# Patient Record
Sex: Female | Born: 1955 | Race: White | Hispanic: No | Smoking: Former smoker
Health system: Southern US, Community
[De-identification: ages and names within clinical notes are randomized; demographics above are authoritative.]

## PROBLEM LIST (undated history)

## (undated) DIAGNOSIS — M810 Age-related osteoporosis without current pathological fracture: Secondary | ICD-10-CM

## (undated) DIAGNOSIS — R011 Cardiac murmur, unspecified: Secondary | ICD-10-CM

## (undated) DIAGNOSIS — H269 Unspecified cataract: Secondary | ICD-10-CM

## (undated) DIAGNOSIS — G43909 Migraine, unspecified, not intractable, without status migrainosus: Secondary | ICD-10-CM

## (undated) DIAGNOSIS — R0600 Dyspnea, unspecified: Secondary | ICD-10-CM

## (undated) DIAGNOSIS — M7581 Other shoulder lesions, right shoulder: Secondary | ICD-10-CM

## (undated) DIAGNOSIS — I4891 Unspecified atrial fibrillation: Secondary | ICD-10-CM

## (undated) DIAGNOSIS — E039 Hypothyroidism, unspecified: Secondary | ICD-10-CM

## (undated) DIAGNOSIS — I255 Ischemic cardiomyopathy: Secondary | ICD-10-CM

## (undated) DIAGNOSIS — R51 Headache: Secondary | ICD-10-CM

## (undated) DIAGNOSIS — D649 Anemia, unspecified: Secondary | ICD-10-CM

## (undated) DIAGNOSIS — M419 Scoliosis, unspecified: Secondary | ICD-10-CM

## (undated) DIAGNOSIS — M1990 Unspecified osteoarthritis, unspecified site: Secondary | ICD-10-CM

## (undated) DIAGNOSIS — I499 Cardiac arrhythmia, unspecified: Secondary | ICD-10-CM

## (undated) DIAGNOSIS — E785 Hyperlipidemia, unspecified: Secondary | ICD-10-CM

## (undated) DIAGNOSIS — Q242 Cor triatriatum: Secondary | ICD-10-CM

## (undated) DIAGNOSIS — E538 Deficiency of other specified B group vitamins: Secondary | ICD-10-CM

## (undated) DIAGNOSIS — I272 Pulmonary hypertension, unspecified: Secondary | ICD-10-CM

## (undated) DIAGNOSIS — K219 Gastro-esophageal reflux disease without esophagitis: Secondary | ICD-10-CM

## (undated) DIAGNOSIS — E119 Type 2 diabetes mellitus without complications: Secondary | ICD-10-CM

## (undated) DIAGNOSIS — Z7901 Long term (current) use of anticoagulants: Secondary | ICD-10-CM

## (undated) DIAGNOSIS — M48061 Spinal stenosis, lumbar region without neurogenic claudication: Secondary | ICD-10-CM

## (undated) DIAGNOSIS — R519 Headache, unspecified: Secondary | ICD-10-CM

## (undated) DIAGNOSIS — I5020 Unspecified systolic (congestive) heart failure: Secondary | ICD-10-CM

## (undated) DIAGNOSIS — I10 Essential (primary) hypertension: Secondary | ICD-10-CM

## (undated) DIAGNOSIS — I639 Cerebral infarction, unspecified: Secondary | ICD-10-CM

## (undated) DIAGNOSIS — K589 Irritable bowel syndrome without diarrhea: Secondary | ICD-10-CM

## (undated) DIAGNOSIS — I509 Heart failure, unspecified: Secondary | ICD-10-CM

## (undated) DIAGNOSIS — I2510 Atherosclerotic heart disease of native coronary artery without angina pectoris: Secondary | ICD-10-CM

## (undated) DIAGNOSIS — I429 Cardiomyopathy, unspecified: Secondary | ICD-10-CM

## (undated) DIAGNOSIS — Z8619 Personal history of other infectious and parasitic diseases: Secondary | ICD-10-CM

## (undated) DIAGNOSIS — G4730 Sleep apnea, unspecified: Secondary | ICD-10-CM

## (undated) DIAGNOSIS — R0609 Other forms of dyspnea: Secondary | ICD-10-CM

## (undated) DIAGNOSIS — G459 Transient cerebral ischemic attack, unspecified: Secondary | ICD-10-CM

## (undated) HISTORY — DX: Other shoulder lesions, right shoulder: M75.81

## (undated) HISTORY — PX: CORONARY ARTERY BYPASS GRAFT: SHX141

## (undated) HISTORY — PX: TUBAL LIGATION: SHX77

## (undated) HISTORY — PX: LEFT ATRIAL APPENDAGE OCCLUSION: SHX173A

## (undated) HISTORY — PX: CATARACT EXTRACTION W/ INTRAOCULAR LENS IMPLANT: SHX1309

## (undated) HISTORY — PX: EYE SURGERY: SHX253

## (undated) HISTORY — PX: CARDIAC CATHETERIZATION: SHX172

## (undated) SURGICAL SUPPLY — 4 items
APPLIER CLIP 11 MED OPEN (CLIP) IMPLANT
APPLIER CLIP 13 LRG OPEN (CLIP) IMPLANT
ELECT REM PT RETURN 9FT ADLT (ELECTROSURGICAL) ×2 IMPLANT
RELOAD LINEAR CUT PROX 55 BLUE (ENDOMECHANICALS) ×4 IMPLANT

---

## 2002-11-28 DIAGNOSIS — Z8619 Personal history of other infectious and parasitic diseases: Secondary | ICD-10-CM

## 2002-11-28 HISTORY — DX: Personal history of other infectious and parasitic diseases: Z86.19

## 2004-01-28 DIAGNOSIS — Z951 Presence of aortocoronary bypass graft: Secondary | ICD-10-CM

## 2004-01-28 HISTORY — PX: COR TRIATRIATUM REPAIR: SHX1396

## 2004-01-28 HISTORY — PX: CORONARY ARTERY BYPASS GRAFT: SHX141

## 2004-01-28 HISTORY — DX: Presence of aortocoronary bypass graft: Z95.1

## 2005-03-01 ENCOUNTER — Ambulatory Visit: Payer: Self-pay | Admitting: Ophthalmology

## 2005-05-11 ENCOUNTER — Ambulatory Visit: Payer: Self-pay | Admitting: General Practice

## 2006-05-23 ENCOUNTER — Ambulatory Visit: Payer: Self-pay | Admitting: General Practice

## 2006-06-12 ENCOUNTER — Ambulatory Visit: Payer: Self-pay | Admitting: Gastroenterology

## 2007-05-30 ENCOUNTER — Ambulatory Visit: Payer: Self-pay | Admitting: Family Medicine

## 2009-04-08 ENCOUNTER — Ambulatory Visit: Payer: Self-pay

## 2010-11-15 ENCOUNTER — Ambulatory Visit: Payer: Self-pay | Admitting: Family Medicine

## 2010-11-24 ENCOUNTER — Ambulatory Visit: Payer: Self-pay | Admitting: Family Medicine

## 2010-12-08 ENCOUNTER — Ambulatory Visit: Payer: Self-pay | Admitting: Family Medicine

## 2011-12-26 ENCOUNTER — Ambulatory Visit: Payer: Self-pay | Admitting: Family Medicine

## 2013-02-13 ENCOUNTER — Ambulatory Visit: Payer: Self-pay | Admitting: Family Medicine

## 2014-02-27 ENCOUNTER — Ambulatory Visit: Payer: Self-pay | Admitting: Family Medicine

## 2014-10-08 DIAGNOSIS — I4891 Unspecified atrial fibrillation: Secondary | ICD-10-CM | POA: Insufficient documentation

## 2014-10-08 DIAGNOSIS — E785 Hyperlipidemia, unspecified: Secondary | ICD-10-CM | POA: Insufficient documentation

## 2014-10-08 DIAGNOSIS — I10 Essential (primary) hypertension: Secondary | ICD-10-CM | POA: Insufficient documentation

## 2014-10-08 DIAGNOSIS — E119 Type 2 diabetes mellitus without complications: Secondary | ICD-10-CM | POA: Insufficient documentation

## 2014-10-08 DIAGNOSIS — I482 Chronic atrial fibrillation, unspecified: Secondary | ICD-10-CM | POA: Insufficient documentation

## 2015-04-22 ENCOUNTER — Ambulatory Visit
Admission: RE | Admit: 2015-04-22 | Discharge: 2015-04-22 | Disposition: A | Discharge status: Home / Self Care | Payer: BLUE CROSS/BLUE SHIELD | Source: Ambulatory Visit | Attending: Family Medicine | Admitting: Family Medicine

## 2015-04-22 ENCOUNTER — Other Ambulatory Visit: Payer: Self-pay | Admitting: Family Medicine

## 2015-04-22 DIAGNOSIS — Z1231 Encounter for screening mammogram for malignant neoplasm of breast: Secondary | ICD-10-CM

## 2016-03-16 ENCOUNTER — Other Ambulatory Visit: Payer: Self-pay | Admitting: Family Medicine

## 2016-03-16 DIAGNOSIS — Z1231 Encounter for screening mammogram for malignant neoplasm of breast: Secondary | ICD-10-CM

## 2016-04-22 ENCOUNTER — Ambulatory Visit: Payer: BLUE CROSS/BLUE SHIELD

## 2016-04-27 ENCOUNTER — Ambulatory Visit
Admission: RE | Admit: 2016-04-27 | Discharge: 2016-04-27 | Disposition: A | Discharge status: Home / Self Care | Payer: BLUE CROSS/BLUE SHIELD | Source: Ambulatory Visit | Attending: Family Medicine | Admitting: Family Medicine

## 2016-04-27 DIAGNOSIS — Z1231 Encounter for screening mammogram for malignant neoplasm of breast: Secondary | ICD-10-CM | POA: Diagnosis not present

## 2016-05-16 ENCOUNTER — Other Ambulatory Visit: Payer: Self-pay | Admitting: Neurology

## 2016-05-16 DIAGNOSIS — R200 Anesthesia of skin: Secondary | ICD-10-CM

## 2016-05-29 DIAGNOSIS — G459 Transient cerebral ischemic attack, unspecified: Secondary | ICD-10-CM

## 2016-05-29 HISTORY — DX: Transient cerebral ischemic attack, unspecified: G45.9

## 2016-06-03 ENCOUNTER — Other Ambulatory Visit: Payer: BLUE CROSS/BLUE SHIELD

## 2016-06-08 ENCOUNTER — Ambulatory Visit
Admission: RE | Admit: 2016-06-08 | Discharge: 2016-06-08 | Disposition: A | Discharge status: Home / Self Care | Payer: BLUE CROSS/BLUE SHIELD | Source: Ambulatory Visit | Attending: Neurology | Admitting: Neurology

## 2016-06-08 DIAGNOSIS — R200 Anesthesia of skin: Secondary | ICD-10-CM | POA: Insufficient documentation

## 2016-06-08 DIAGNOSIS — R202 Paresthesia of skin: Secondary | ICD-10-CM | POA: Insufficient documentation

## 2016-06-08 DIAGNOSIS — R9082 White matter disease, unspecified: Secondary | ICD-10-CM | POA: Insufficient documentation

## 2017-03-03 ENCOUNTER — Other Ambulatory Visit: Payer: Self-pay | Admitting: Family Medicine

## 2017-03-03 DIAGNOSIS — K439 Ventral hernia without obstruction or gangrene: Secondary | ICD-10-CM

## 2017-03-03 DIAGNOSIS — Z1231 Encounter for screening mammogram for malignant neoplasm of breast: Secondary | ICD-10-CM

## 2017-03-07 ENCOUNTER — Ambulatory Visit: Payer: BLUE CROSS/BLUE SHIELD

## 2017-03-21 ENCOUNTER — Other Ambulatory Visit: Payer: Self-pay | Admitting: Cardiology

## 2017-03-21 DIAGNOSIS — I25110 Atherosclerotic heart disease of native coronary artery with unstable angina pectoris: Secondary | ICD-10-CM | POA: Insufficient documentation

## 2017-03-21 DIAGNOSIS — I255 Ischemic cardiomyopathy: Secondary | ICD-10-CM | POA: Insufficient documentation

## 2017-03-23 ENCOUNTER — Ambulatory Visit
Admission: RE | Admit: 2017-03-23 | Discharge: 2017-03-23 | Disposition: A | Discharge status: Home / Self Care | Payer: BLUE CROSS/BLUE SHIELD | Source: Ambulatory Visit | Attending: Family Medicine | Admitting: Family Medicine

## 2017-03-23 DIAGNOSIS — K439 Ventral hernia without obstruction or gangrene: Secondary | ICD-10-CM | POA: Diagnosis present

## 2017-03-29 ENCOUNTER — Encounter
Admission: RE | Disposition: A | Discharge status: Home / Self Care | Payer: Self-pay | Source: Ambulatory Visit | Attending: Cardiology

## 2017-03-29 ENCOUNTER — Ambulatory Visit
Admission: RE | Admit: 2017-03-29 | Discharge: 2017-03-29 | Disposition: A | Discharge status: Home / Self Care | Payer: BLUE CROSS/BLUE SHIELD | Source: Ambulatory Visit | Attending: Cardiology | Admitting: Cardiology

## 2017-03-29 DIAGNOSIS — E7800 Pure hypercholesterolemia, unspecified: Secondary | ICD-10-CM | POA: Diagnosis not present

## 2017-03-29 DIAGNOSIS — Z7901 Long term (current) use of anticoagulants: Secondary | ICD-10-CM | POA: Diagnosis not present

## 2017-03-29 DIAGNOSIS — I2510 Atherosclerotic heart disease of native coronary artery without angina pectoris: Secondary | ICD-10-CM | POA: Diagnosis present

## 2017-03-29 DIAGNOSIS — I272 Pulmonary hypertension, unspecified: Secondary | ICD-10-CM | POA: Diagnosis not present

## 2017-03-29 DIAGNOSIS — G43909 Migraine, unspecified, not intractable, without status migrainosus: Secondary | ICD-10-CM | POA: Diagnosis not present

## 2017-03-29 DIAGNOSIS — I110 Hypertensive heart disease with heart failure: Secondary | ICD-10-CM | POA: Insufficient documentation

## 2017-03-29 DIAGNOSIS — I2582 Chronic total occlusion of coronary artery: Secondary | ICD-10-CM | POA: Diagnosis not present

## 2017-03-29 DIAGNOSIS — Z951 Presence of aortocoronary bypass graft: Secondary | ICD-10-CM | POA: Diagnosis not present

## 2017-03-29 DIAGNOSIS — Z7984 Long term (current) use of oral hypoglycemic drugs: Secondary | ICD-10-CM | POA: Insufficient documentation

## 2017-03-29 DIAGNOSIS — I255 Ischemic cardiomyopathy: Secondary | ICD-10-CM | POA: Insufficient documentation

## 2017-03-29 DIAGNOSIS — M810 Age-related osteoporosis without current pathological fracture: Secondary | ICD-10-CM | POA: Insufficient documentation

## 2017-03-29 DIAGNOSIS — E118 Type 2 diabetes mellitus with unspecified complications: Secondary | ICD-10-CM | POA: Insufficient documentation

## 2017-03-29 DIAGNOSIS — Z8249 Family history of ischemic heart disease and other diseases of the circulatory system: Secondary | ICD-10-CM | POA: Diagnosis not present

## 2017-03-29 DIAGNOSIS — Z87891 Personal history of nicotine dependence: Secondary | ICD-10-CM | POA: Insufficient documentation

## 2017-03-29 DIAGNOSIS — K589 Irritable bowel syndrome without diarrhea: Secondary | ICD-10-CM | POA: Insufficient documentation

## 2017-03-29 DIAGNOSIS — E039 Hypothyroidism, unspecified: Secondary | ICD-10-CM | POA: Diagnosis not present

## 2017-03-29 DIAGNOSIS — I482 Chronic atrial fibrillation: Secondary | ICD-10-CM | POA: Diagnosis not present

## 2017-03-29 DIAGNOSIS — I509 Heart failure, unspecified: Secondary | ICD-10-CM | POA: Diagnosis not present

## 2017-03-29 DIAGNOSIS — Z7982 Long term (current) use of aspirin: Secondary | ICD-10-CM | POA: Diagnosis not present

## 2017-03-29 DIAGNOSIS — Z8774 Personal history of (corrected) congenital malformations of heart and circulatory system: Secondary | ICD-10-CM | POA: Insufficient documentation

## 2017-03-29 HISTORY — PX: RIGHT/LEFT HEART CATH AND CORONARY ANGIOGRAPHY: CATH118266

## 2017-03-29 HISTORY — DX: Dyspnea, unspecified: R06.00

## 2017-03-29 HISTORY — DX: Gastro-esophageal reflux disease without esophagitis: K21.9

## 2017-03-29 HISTORY — DX: Essential (primary) hypertension: I10

## 2017-03-29 HISTORY — DX: Atherosclerotic heart disease of native coronary artery without angina pectoris: I25.10

## 2017-03-29 HISTORY — DX: Type 2 diabetes mellitus without complications: E11.9

## 2017-03-29 LAB — PROTIME-INR
INR: 1.21
PROTHROMBIN TIME: 15.4 s — AB (ref 11.4–15.2)

## 2017-03-29 LAB — GLUCOSE, CAPILLARY: GLUCOSE-CAPILLARY: 133 mg/dL — AB (ref 65–99)

## 2017-03-29 SURGERY — RIGHT/LEFT HEART CATH AND CORONARY ANGIOGRAPHY
Anesthesia: Moderate Sedation | Laterality: Bilateral

## 2017-03-29 SURGERY — RIGHT AND LEFT HEART CATH
Anesthesia: Moderate Sedation

## 2017-03-29 SURGERY — RIGHT/LEFT HEART CATH AND CORONARY ANGIOGRAPHY
Anesthesia: Moderate Sedation

## 2017-03-29 MED ORDER — SODIUM CHLORIDE 0.9 % WEIGHT BASED INFUSION
1.0000 mL/kg/h | INTRAVENOUS | Status: DC
  Administered 2017-03-29: 1 mL/kg/h via INTRAVENOUS

## 2017-03-29 MED ORDER — METFORMIN HCL 500 MG PO TABS: 1000.0000 mg | ORAL_TABLET | Freq: Two times a day (BID) | ORAL | Status: DC

## 2017-03-29 MED ORDER — SODIUM CHLORIDE 0.9 % IV SOLN: 250.0000 mL | INTRAVENOUS | Status: DC | PRN

## 2017-03-29 MED ORDER — SODIUM CHLORIDE 0.9% FLUSH: 3.0000 mL | INTRAVENOUS | Status: DC | PRN

## 2017-03-29 MED ORDER — SODIUM CHLORIDE 0.9 % WEIGHT BASED INFUSION: 1.0000 mL/kg/h | INTRAVENOUS | Status: DC

## 2017-03-29 MED ORDER — FENTANYL CITRATE (PF) 100 MCG/2ML IJ SOLN
INTRAMUSCULAR | Status: AC
  Filled 2017-03-29: qty 2

## 2017-03-29 MED ORDER — LIDOCAINE HCL (PF) 1 % IJ SOLN
INTRAMUSCULAR | Status: DC | PRN
  Administered 2017-03-29: 19 mL via SUBCUTANEOUS

## 2017-03-29 MED ORDER — SODIUM CHLORIDE 0.9% FLUSH: 3.0000 mL | Freq: Two times a day (BID) | INTRAVENOUS | Status: DC

## 2017-03-29 MED ORDER — ASPIRIN 81 MG PO CHEW
81.0000 mg | CHEWABLE_TABLET | ORAL | Status: AC
  Administered 2017-03-29: 81 mg via ORAL

## 2017-03-29 MED ORDER — HEPARIN (PORCINE) IN NACL 2-0.9 UNIT/ML-% IJ SOLN
INTRAMUSCULAR | Status: AC
  Filled 2017-03-29: qty 500

## 2017-03-29 MED ORDER — IOPAMIDOL (ISOVUE-300) INJECTION 61%
INTRAVENOUS | Status: DC | PRN
  Administered 2017-03-29: 170 mL via INTRA_ARTERIAL

## 2017-03-29 MED ORDER — LIDOCAINE HCL (PF) 1 % IJ SOLN
INTRAMUSCULAR | Status: AC
  Filled 2017-03-29: qty 30

## 2017-03-29 MED ORDER — MIDAZOLAM HCL 2 MG/2ML IJ SOLN
INTRAMUSCULAR | Status: DC | PRN
  Administered 2017-03-29: 0.5 mg via INTRAVENOUS

## 2017-03-29 MED ORDER — ASPIRIN 81 MG PO CHEW
CHEWABLE_TABLET | ORAL | Status: AC
  Filled 2017-03-29: qty 1

## 2017-03-29 MED ORDER — FENTANYL CITRATE (PF) 100 MCG/2ML IJ SOLN
INTRAMUSCULAR | Status: DC | PRN
  Administered 2017-03-29: 25 ug via INTRAVENOUS

## 2017-03-29 MED ORDER — MIDAZOLAM HCL 2 MG/2ML IJ SOLN
INTRAMUSCULAR | Status: AC
  Filled 2017-03-29: qty 2

## 2017-03-29 MED ORDER — SODIUM CHLORIDE 0.9 % WEIGHT BASED INFUSION
3.0000 mL/kg/h | INTRAVENOUS | Status: DC
  Administered 2017-03-29: 3 mL/kg/h via INTRAVENOUS

## 2017-03-29 SURGICAL SUPPLY — 15 items
CATH INFINITI 5 FR IM (CATHETERS) ×3 IMPLANT
CATH INFINITI 5 FR LCB (CATHETERS) ×3 IMPLANT
CATH INFINITI 5FR ANG PIGTAIL (CATHETERS) ×3 IMPLANT
CATH INFINITI 5FR JL4 (CATHETERS) ×3 IMPLANT
CATH INFINITI JR4 5F (CATHETERS) ×3 IMPLANT
CATH SWANZ 7F THERMO (CATHETERS) ×3 IMPLANT
DEVICE CLOSURE MYNXGRIP 5F (Vascular Products) ×3 IMPLANT
GUIDEWIRE 3MM J TIP .035 145 (WIRE) ×2 IMPLANT
GUIDEWIRE J TIP .035 (WIRE) ×3 IMPLANT
KIT MANI 3VAL PERCEP (MISCELLANEOUS) ×3 IMPLANT
KIT RIGHT HEART (MISCELLANEOUS) ×3 IMPLANT
NEEDLE PERC 18GX7CM (NEEDLE) ×3 IMPLANT
PACK CARDIAC CATH (CUSTOM PROCEDURE TRAY) ×3 IMPLANT
SHEATH AVANTI 5FR X 11CM (SHEATH) ×3 IMPLANT
SHEATH PINNACLE 7F 10CM (SHEATH) ×3 IMPLANT

## 2017-03-29 NOTE — H&P (Signed)
Chief Complaint: Chief Complaint  Patient presents with  . Shortness of Breath  Yes I still have some ==Friday nite I had a bad spell walking from one field to the other  . other  I had an echo and myoview  Date of Service: 03/21/2017 Date of Birth: 1956/01/30 PCP: Macie Burows, MD  History of Present Illness: Ms. Tammy Arias is a 61 y.o.female patient who presents for follow-up visit. Has a history of atrial fibrillation, hypertension, hyperlipidemia and coronary artery disease status post coronary bypass grafting 3 in 2005 with a repair of cor triatriatum an ASD repair with a ischemic cardiomyopathy with an ejection fraction of 25-30%. She is on aspirin for anti-platelet therapy. She is anticoagulated with warfarin for her atrial fibrillation and remains on lovastatin for hyperlipidemia. She has had good rate control with her atrial fibrillation.. Patient complains of increasing dyspnea on exertion. This is gradually increased. Functional study showed no reversible ischemia. Echo showed reduced LV function similar to previously with TR and pulmonary hypertension. Her symptoms have persisted. Will need to further evaluate with right left heart cath.  Past Medical and Surgical History  Past Medical History Past Medical History:  Diagnosis Date  . Anemia, unspecified  . Arthritis  . Atrial fibrillation , unspecified (CMS-HCC)  . Cardiomyopathy, secondary (CMS-HCC)  ef 25-30  . Cataract cortical, senile  . Congestive heart disease (CMS-HCC)  . Coronary artery disease  . Heart murmur, unspecified  . History of shingles  . Hyperlipidemia, unspecified  . Hypertension  . Hypothyroidism, unspecified  . IBS (irritable bowel syndrome)  . Migraines  . Osteoporosis, post-menopausal  . Type 2 diabetes mellitus (CMS-HCC)   Past Surgical History She has a past surgical history that includes Tubal ligation (1980s); Congenital heart surgery; Coronary artery bypass graft (2005); and  Cardiac surgery.   Medications and Allergies  Current Medications  Current Outpatient Prescriptions  Medication Sig Dispense Refill  . albuterol 90 mcg/actuation inhaler Inhale 2 inhalations into the lungs every 6 (six) hours as needed. 1 Inhaler 0  . ALPRAZolam (XANAX) 1 MG tablet Take 1 mg by mouth once daily as needed for Sleep.  Marland Kitchen aspirin 81 MG EC tablet Take 81 mg by mouth once daily.  . carvedilol (COREG) 3.125 MG tablet Take 1 tablet (3.125 mg total) by mouth 2 (two) times daily with meals. 180 tablet 3  . clobetasol (TEMOVATE) 0.05 % ointment Apply topically once daily as needed (Apply a small amount to affected are qd prn).  . cyanocobalamin (VITAMIN B12) 1,000 mcg/mL injection Inject 1,000 mcg into the muscle monthly.  . cyanocobalamin (VITAMIN B12) 1000 MCG tablet Take 1,000 mcg by mouth once daily.  . cyclobenzaprine (FLEXERIL) 5 MG tablet Take 1 tablet (5 mg total) by mouth every 8 (eight) hours as needed for Muscle spasms. 20 tablet 0  . FUROsemide (LASIX) 40 MG tablet Take 1 tablet (40 mg total) by mouth once daily. 90 tablet 3  . inhalational spacer (AEROCHAMBER) spacer Use as instructed. 1 each 2  . LATANOPROST OPHTH Apply to eye.  . levothyroxine (SYNTHROID, LEVOTHROID) 75 MCG tablet Take 1 tablet (75 mcg total) by mouth once daily. Take on an empty stomach with a glass of water at least 30-60 minutes before breakfast. 90 tablet 3  . lisinopril (PRINIVIL,ZESTRIL) 5 MG tablet Take 1 tablet (5 mg total) by mouth once daily. 90 tablet 2  . lovastatin (MEVACOR) 10 MG tablet Take 1 tablet (10 mg total) by mouth daily with dinner.  90 tablet 3  . warfarin (COUMADIN) 3 MG tablet Take 1 tablet (3 mg total) by mouth as directed. 180 tablet 3  . metFORMIN (GLUCOPHAGE) 1000 MG tablet Take 1 tablet (1,000 mg total) by mouth 2 (two) times daily with meals. 180 tablet 3   No current facility-administered medications for this visit.   Allergies: Vioxx [rofecoxib]  Social and Family  History  Social History reports that she has quit smoking. She has never used smokeless tobacco. She reports that she does not drink alcohol or use drugs.  Family History Family History  Problem Relation Age of Onset  . Stroke Mother  . High blood pressure (Hypertension) Mother  . Myocardial Infarction (Heart attack) Father  . Stroke Father  . Diabetes type II Father  . Dementia Sister 47  . Lung cancer Sister 59  small cell   Review of Systems  Review of Systems  Constitutional: Negative for chills, diaphoresis, fever, malaise/fatigue and weight loss.  HENT: Negative for congestion, ear discharge, hearing loss and tinnitus.  Eyes: Negative for blurred vision.  Respiratory: Positive for shortness of breath. Negative for cough, hemoptysis, sputum production and wheezing.  Cardiovascular: Negative for chest pain, palpitations, orthopnea, claudication, leg swelling and PND.  Gastrointestinal: Negative for abdominal pain, blood in stool, constipation, diarrhea, heartburn, melena, nausea and vomiting.  Genitourinary: Negative for dysuria, frequency, hematuria and urgency.  Musculoskeletal: Negative for back pain, falls, joint pain and myalgias.  Skin: Negative for itching and rash.  Neurological: Negative for dizziness, tingling, focal weakness, loss of consciousness, weakness and headaches.  Endo/Heme/Allergies: Negative for polydipsia. Does not bruise/bleed easily.  Psychiatric/Behavioral: Negative for depression, memory loss and substance abuse. The patient is not nervous/anxious.    Physical Examination   Vitals:BP 122/72 (BP Location: Left upper arm, Patient Position: Sitting, BP Cuff Size: Adult)  Pulse 68  Resp 12  Ht 157.5 cm (5\' 2" )  Wt 69.9 kg (154 lb 3.2 oz)  BMI 28.20 kg/m  Ht:157.5 cm (5\' 2" ) Wt:69.9 kg (154 lb 3.2 oz) ACZ:YSAY surface area is 1.75 meters squared. Body mass index is 28.2 kg/m.  Wt Readings from Last 3 Encounters:  03/21/17 69.9 kg (154 lb 3.2 oz)   03/01/17 71.7 kg (158 lb)  02/15/17 71.2 kg (157 lb)   BP Readings from Last 3 Encounters:  03/21/17 122/72  03/01/17 118/72  02/15/17 132/70   General: Female in no acute distress LUNGS Breath Sounds: Normal Percussion: Normal  CARDIOVASCULAR JVP CV wave: no HJR: no Elevation at 90 degrees: None Carotid Pulse: normal pulsation bilaterally Bruit: None Apex: apical impulse normal  Auscultation Rhythm: atrial fibrillation S1: normal S2: normal Clicks: no Rub: no Murmurs: no murmurs  Gallop: None ABDOMEN Liver enlargement: no Pulsatile aorta: no Ascites: no Bruits: no  EXTREMITIES Clubbing: no Edema: trace to 1+ bilateral pedal edema Pulses: peripheral pulses symmetrical Femoral Bruits: no Amputation: no SKIN Rash: no Cyanosis: no Embolic phemonenon: no Bruising: no NEURO Alert and Oriented to person, place and time: yes Non focal: yes LABS Last 3 CBC results: Lab Results  Component Value Date  WBC 3.9 (L) 03/01/2017  WBC 4.3 03/04/2016  WBC 2.9 (L) 02/25/2015   Lab Results  Component Value Date  HGB 10.0 (L) 03/01/2017  HGB 12.1 03/04/2016  HGB 11.9 (L) 02/25/2015   Lab Results  Component Value Date  HCT 32.9 (L) 03/01/2017  HCT 37.5 03/04/2016  HCT 35.5 02/25/2015   Lab Results  Component Value Date  PLT 148 (L) 03/01/2017  PLT  129 (L) 03/04/2016  PLT 117 (L) 02/25/2015   Lab Results  Component Value Date  CREATININE 0.7 03/01/2017  BUN 14 03/01/2017  NA 140 03/01/2017  K 4.3 03/01/2017  CL 105 03/01/2017  CO2 26.9 03/01/2017   Lab Results  Component Value Date  HGBA1C 6.6 (H) 03/01/2017   Lab Results  Component Value Date  HDL 62.2 03/01/2017  HDL 83.9 03/04/2016  HDL 74.6 08/18/2014   Lab Results  Component Value Date  LDLCALC 64 03/01/2017  LDLCALC 71 03/04/2016  LDLCALC 72 08/18/2014   Lab Results  Component Value Date  TRIG 100 03/01/2017  TRIG 81 03/04/2016  TRIG 103 08/18/2014   Lab Results  Component  Value Date  ALT 20 03/01/2017  AST 30 03/01/2017  ALKPHOS 93 03/01/2017   Lab Results  Component Value Date  TSH 3.047 03/01/2017   Assessment and Plan   61 y.o. female with  ICD-10-CM ICD-9-CM  1. CAD of autologous bypass graft-no evidence of ischemia on EKG or clinically. Will continue with current regimen including aspirin, carvedilol and lovastatin. Low-fat low-cholesterol diet. Symptoms are persistent. We will proceed with a left heart cath to evaluate coronary anatomy to guide further therapy due to persistent unstable symptoms despite unremarkable functional study showing no reversible ischemia. I25.810 414.02 ECG 12-lead  2. Chronic atrial fibrillation-rate is controlled with carvedilol. She is anticoagulated with warfarin with an INR goal between 2 and 3. Will need to stop this 5 days prior to cardiac cath. I48.2 427.31  3. Pure hypercholesterolemia-hyperlipidemia is stable with lovastatin. LDL goal of less than 100. Current level is64.. E78.0 272.0  4. Essential hypertension-blood pressure is controlled with carvedilol and dash diet . Daily exercise, weight loss and compliance with of above-mentioned plan was recommended I10 401.9  5. SOB (shortness of breath)-echo shows EF 30-40%. Will need to evaluate progression of her coronary disease as well as evaluate her pulmonary pressures with right left heart cath. Further recognitions after this is complete. R06.02 786.05  6. Type 2 diabetes mellitus with complication-continue with metformin and ADA diet with an hemoglobin A1c goal of less than 6. Most recent value was 6.3. Compliance with ADA diet was recommended E11.8 250.90   Return in about 4 weeks (around 04/18/2017).  These notes generated with voice recognition software. I apologize for typographical errors.  Sydnee Levans, MD    Pt seen and examined. No change from above.

## 2017-03-29 NOTE — Progress Notes (Signed)
Patient resting ,stable clinically post heart cath. Sinus rhythm per monitor. Denies complaints at this time. Dr Ubaldo Glassing out to speak with patient and friend at length with questions answered. Right groin without bleeding nor hematoma.discharge teaching done with  Questions answered.

## 2017-03-29 NOTE — Discharge Instructions (Signed)
Groin Insertion Instructions-If you lose feeling or develop tingling or pain in your leg or foot after the procedure, please walk around first.  If the discomfort does not improve , contact your physician and proceed to the nearest emergency room.  Loss of feeling in your leg might mean that a blockage has formed in the artery and this can be appropriately treated.  Limit your activity for the next two days after your procedure.  Avoid stooping, bending, heavy lifting or exertion as this may put pressure on the insertion site.  Resume normal activities in 48 hours.  You may shower after 24 hours but avoid excessive warm water and do not scrub the site.  Remove clear dressing in 48 hours.  If you have had a closure device inserted, do not soak in a tub bath or a hot tub for at least one week. ° °No driving for 48 hours after discharge.  After the procedure, check the insertion site occasionally.  If any oozing occurs or there is apparent swelling, firm pressure over the site will prevent a bruise from forming.  You can not hurt anything by pressing directly on the site.  The pressure stops the bleeding by allowing a small clot to form.  If the bleeding continues after the pressure has been applied for more than 15 minutes, call 911 or go to the nearest emergency room.   ° °The x-ray dye causes you to pass a considerate amount of urine.  For this reason, you will be asked to drink plenty of liquids after the procedure to prevent dehydration.  You may resume you regular diet.  Avoid caffeine products.   ° °For pain at the site of your procedure, take non-aspirin medicines such as Tylenol. ° °Medications: A. Hold Metformin for 48 hours if applicable.  B. Continue taking all your present medications at home unless your doctor prescribes any changes. ° °Moderate Conscious Sedation, Adult, Care After °These instructions provide you with information about caring for yourself after your procedure. Your health care provider  may also give you more specific instructions. Your treatment has been planned according to current medical practices, but problems sometimes occur. Call your health care provider if you have any problems or questions after your procedure. °What can I expect after the procedure? °After your procedure, it is common: °· To feel sleepy for several hours. °· To feel clumsy and have poor balance for several hours. °· To have poor judgment for several hours. °· To vomit if you eat too soon. °Follow these instructions at home: °For at least 24 hours after the procedure:  ° °· Do not: °¨ Participate in activities where you could fall or become injured. °¨ Drive. °¨ Use heavy machinery. °¨ Drink alcohol. °¨ Take sleeping pills or medicines that cause drowsiness. °¨ Make important decisions or sign legal documents. °¨ Take care of children on your own. °· Rest. °Eating and drinking  °· Follow the diet recommended by your health care provider. °· If you vomit: °¨ Drink water, juice, or soup when you can drink without vomiting. °¨ Make sure you have little or no nausea before eating solid foods. °General instructions  °· Have a responsible adult stay with you until you are awake and alert. °· Take over-the-counter and prescription medicines only as told by your health care provider. °· If you smoke, do not smoke without supervision. °· Keep all follow-up visits as told by your health care provider. This is important. °Contact a health   care provider if: °· You keep feeling nauseous or you keep vomiting. °· You feel light-headed. °· You develop a rash. °· You have a fever. °Get help right away if: °· You have trouble breathing. °This information is not intended to replace advice given to you by your health care provider. Make sure you discuss any questions you have with your health care provider. °Document Released: 09/04/2013 Document Revised: 04/18/2016 Document Reviewed: 03/05/2016 °Elsevier Interactive Patient Education © 2017  [employer] ° °

## 2017-04-12 ENCOUNTER — Encounter
Admission: RE | Admit: 2017-04-12 | Discharge: 2017-04-12 | Disposition: A | Discharge status: Home / Self Care | Payer: BLUE CROSS/BLUE SHIELD | Source: Ambulatory Visit | Attending: Surgery | Admitting: Surgery

## 2017-04-12 DIAGNOSIS — Z01812 Encounter for preprocedural laboratory examination: Secondary | ICD-10-CM | POA: Diagnosis present

## 2017-04-12 HISTORY — DX: Unspecified atrial fibrillation: I48.91

## 2017-04-12 HISTORY — DX: Deficiency of other specified B group vitamins: E53.8

## 2017-04-12 HISTORY — DX: Anemia, unspecified: D64.9

## 2017-04-12 HISTORY — DX: Hypothyroidism, unspecified: E03.9

## 2017-04-12 HISTORY — DX: Cardiomyopathy, unspecified: I42.9

## 2017-04-12 HISTORY — DX: Unspecified osteoarthritis, unspecified site: M19.90

## 2017-04-12 HISTORY — DX: Heart failure, unspecified: I50.9

## 2017-04-12 HISTORY — DX: Cardiac murmur, unspecified: R01.1

## 2017-04-12 HISTORY — DX: Hyperlipidemia, unspecified: E78.5

## 2017-04-12 HISTORY — DX: Headache: R51

## 2017-04-12 HISTORY — DX: Cardiac arrhythmia, unspecified: I49.9

## 2017-04-12 HISTORY — DX: Irritable bowel syndrome, unspecified: K58.9

## 2017-04-12 HISTORY — DX: Headache, unspecified: R51.9

## 2017-04-12 HISTORY — DX: Transient cerebral ischemic attack, unspecified: G45.9

## 2017-04-12 HISTORY — DX: Personal history of other infectious and parasitic diseases: Z86.19

## 2017-04-12 LAB — CBC
HEMATOCRIT: 30.2 % — AB (ref 35.0–47.0)
Hemoglobin: 9.6 g/dL — ABNORMAL LOW (ref 12.0–16.0)
MCH: 26.1 pg (ref 26.0–34.0)
MCHC: 31.7 g/dL — ABNORMAL LOW (ref 32.0–36.0)
MCV: 82.3 fL (ref 80.0–100.0)
PLATELETS: 151 10*3/uL (ref 150–440)
RBC: 3.67 MIL/uL — AB (ref 3.80–5.20)
RDW: 16.7 % — ABNORMAL HIGH (ref 11.5–14.5)
WBC: 3.3 10*3/uL — AB (ref 3.6–11.0)

## 2017-04-12 LAB — DIFFERENTIAL
BASOS ABS: 0 10*3/uL (ref 0–0.1)
Basophils Relative: 1 %
Eosinophils Absolute: 0.2 10*3/uL (ref 0–0.7)
Eosinophils Relative: 6 %
LYMPHS PCT: 15 %
Lymphs Abs: 0.5 10*3/uL — ABNORMAL LOW (ref 1.0–3.6)
MONO ABS: 0.4 10*3/uL (ref 0.2–0.9)
MONOS PCT: 13 %
NEUTROS ABS: 2.2 10*3/uL (ref 1.4–6.5)
Neutrophils Relative %: 65 %

## 2017-04-12 NOTE — Patient Instructions (Signed)
Your procedure is scheduled [id number], 2018 (Friday) Report to Same Day Surgery 2nd floor medical mall [facility] Entrance-take elevator on left to 2nd floor.  Check in with surgery information desk.) To find out your arrival time please call [phone] between 1PM - 3PM on Apr 20, 2017 (Thursday)  Remember: Instructions that are not followed completely may result in serious medical risk, up to and including death, or upon the discretion of your surgeon and anesthesiologist your surgery may need to be rescheduled.    _x___ 1. Do not eat food or drink liquids after midnight. No gum chewing or hard candies                            .     __x__ 2. No Alcohol for 24 hours before or after surgery.   __x__3. No Smoking for 24 prior to surgery.   ____  4. Bring all medications with you on the day of surgery if instructed.    __x__ 5. Notify your doctor if there is any change in your medical condition     (cold, fever, infections).     Do not wear jewelry, make-up, hairpins, clips or nail polish.  Do not wear lotions, powders, or perfumes. You may wear deodorant.  Do not shave 48 hours prior to surgery. Men may shave face and neck.  Do not bring valuables to the hospital.    [facility] is not responsible for any belongings or valuables.               Contacts, dentures or bridgework may not be worn into surgery.  Leave your suitcase in the car. After surgery it may be brought to your room.  For patients admitted to the hospital, discharge time is determined by your treatment team                        Patients discharged the day of surgery will not be allowed to drive home.  You will need someone to drive you home and stay with you the night of your procedure.    Please read over the following fact sheets that you were given:   [facility] Preparing for Surgery and or MRSA Information   _x___ Take the following medications the morning of surgery with a sip of water:            1.  Levothyroxine            2. Lisinopril                 3. Carvedilol    ____Fleets enema or Magnesium Citrate as directed.   _x___ Use CHG Soap or sage wipes as directed on instruction sheet   _x___ Use inhalers on the day of surgery and bring to hospital day of surgery (USE PRO AIR [place])  _X___ Stop Metformin and Janumet 2 days prior to surgery.(STOP METFORMIN ON MAY 23)    ____ Take 1/2 of usual insulin dose the night before surgery and none on the morning     surgery.   _x___ Follow recommendations from Cardiologist, Pulmonologist or PCP regarding          stopping Aspirin, Coumadin, Pllavix ,Eliquis, Effient, or Pradaxa, and Pletal. (STOP  ASPIRIN ON MAY 17 AND STOP WARFARIN OM MAY  20)   X____Stop Anti-inflammatories such as Advil, Aleve,  Ibuprofen, Motrin, Naproxen, Naprosyn, Goodies powders or aspirin products. OK to take Tylenol   _x___ Stop supplements until after surgery.  But may continue Vitamin D, Vitamin B,  and multivitamin          ____ Bring C-Pap to the hospital.

## 2017-04-12 NOTE — Pre-Procedure Instructions (Signed)
Component Name Value Ref Range  Vent Rate (bpm) 79   QRS Interval (msec) 94   QT Interval (msec) 382   QTc (msec) 438   Result Narrative  Atrial fibrillation ST and T wave abnormality, consider inferior ischemia ST and T wave abnormality, consider anterolateral ischemia Abnormal ECG When compared with ECG of 13-Apr-2016 11:36, No significant change was found I reviewed and concur with this report. Electronically signed FR:EVQW MD, KEN ([id number]) on 02/22/2017 8:28:51 AM  Status Results Details

## 2017-04-14 NOTE — Pre-Procedure Instructions (Signed)
CBC sent to Dr. Tamala Julian and Anesthesia for review.

## 2017-04-21 ENCOUNTER — Ambulatory Visit
Admission: RE | Admit: 2017-04-21 | Discharge: 2017-04-21 | Disposition: A | Discharge status: Home / Self Care | Payer: BLUE CROSS/BLUE SHIELD | Source: Ambulatory Visit | Attending: Surgery | Admitting: Surgery

## 2017-04-21 ENCOUNTER — Ambulatory Visit: Payer: BLUE CROSS/BLUE SHIELD | Admitting: Certified Registered Nurse Anesthetist

## 2017-04-21 ENCOUNTER — Encounter: Payer: Self-pay | Admitting: *Deleted

## 2017-04-21 ENCOUNTER — Encounter
Admission: RE | Disposition: A | Discharge status: Home / Self Care | Payer: Self-pay | Source: Ambulatory Visit | Attending: Surgery

## 2017-04-21 DIAGNOSIS — I429 Cardiomyopathy, unspecified: Secondary | ICD-10-CM | POA: Insufficient documentation

## 2017-04-21 DIAGNOSIS — Z7984 Long term (current) use of oral hypoglycemic drugs: Secondary | ICD-10-CM | POA: Diagnosis not present

## 2017-04-21 DIAGNOSIS — I110 Hypertensive heart disease with heart failure: Secondary | ICD-10-CM | POA: Insufficient documentation

## 2017-04-21 DIAGNOSIS — K436 Other and unspecified ventral hernia with obstruction, without gangrene: Secondary | ICD-10-CM | POA: Diagnosis present

## 2017-04-21 DIAGNOSIS — Z8673 Personal history of transient ischemic attack (TIA), and cerebral infarction without residual deficits: Secondary | ICD-10-CM | POA: Insufficient documentation

## 2017-04-21 DIAGNOSIS — I4891 Unspecified atrial fibrillation: Secondary | ICD-10-CM | POA: Diagnosis not present

## 2017-04-21 DIAGNOSIS — Z7982 Long term (current) use of aspirin: Secondary | ICD-10-CM | POA: Diagnosis not present

## 2017-04-21 DIAGNOSIS — E538 Deficiency of other specified B group vitamins: Secondary | ICD-10-CM | POA: Insufficient documentation

## 2017-04-21 DIAGNOSIS — I272 Pulmonary hypertension, unspecified: Secondary | ICD-10-CM | POA: Diagnosis not present

## 2017-04-21 DIAGNOSIS — I509 Heart failure, unspecified: Secondary | ICD-10-CM | POA: Insufficient documentation

## 2017-04-21 DIAGNOSIS — E039 Hypothyroidism, unspecified: Secondary | ICD-10-CM | POA: Diagnosis not present

## 2017-04-21 DIAGNOSIS — Z7901 Long term (current) use of anticoagulants: Secondary | ICD-10-CM | POA: Insufficient documentation

## 2017-04-21 DIAGNOSIS — E119 Type 2 diabetes mellitus without complications: Secondary | ICD-10-CM | POA: Insufficient documentation

## 2017-04-21 DIAGNOSIS — Z79899 Other long term (current) drug therapy: Secondary | ICD-10-CM | POA: Diagnosis not present

## 2017-04-21 DIAGNOSIS — I2510 Atherosclerotic heart disease of native coronary artery without angina pectoris: Secondary | ICD-10-CM | POA: Insufficient documentation

## 2017-04-21 DIAGNOSIS — E785 Hyperlipidemia, unspecified: Secondary | ICD-10-CM | POA: Insufficient documentation

## 2017-04-21 DIAGNOSIS — Z87891 Personal history of nicotine dependence: Secondary | ICD-10-CM | POA: Insufficient documentation

## 2017-04-21 DIAGNOSIS — K430 Incisional hernia with obstruction, without gangrene: Secondary | ICD-10-CM | POA: Diagnosis not present

## 2017-04-21 HISTORY — PX: VENTRAL HERNIA REPAIR: SHX424

## 2017-04-21 LAB — PROTIME-INR
INR: 1.14
Prothrombin Time: 14.7 seconds (ref 11.4–15.2)

## 2017-04-21 LAB — GLUCOSE, CAPILLARY
Glucose-Capillary: 137 mg/dL — ABNORMAL HIGH (ref 65–99)
Glucose-Capillary: 140 mg/dL — ABNORMAL HIGH (ref 65–99)

## 2017-04-21 SURGERY — REPAIR, HERNIA, VENTRAL
Anesthesia: General | Site: Abdomen | Wound class: Clean

## 2017-04-21 MED ORDER — MIDAZOLAM HCL 2 MG/2ML IJ SOLN
INTRAMUSCULAR | Status: DC | PRN
  Administered 2017-04-21 (×2): 1 mg via INTRAVENOUS

## 2017-04-21 MED ORDER — EPHEDRINE SULFATE 50 MG/ML IJ SOLN
INTRAMUSCULAR | Status: AC
  Filled 2017-04-21: qty 1

## 2017-04-21 MED ORDER — SODIUM CHLORIDE 0.9 % IV SOLN
INTRAVENOUS | Status: DC
  Administered 2017-04-21: 09:00:00 via INTRAVENOUS

## 2017-04-21 MED ORDER — PROPOFOL 10 MG/ML IV BOLUS
INTRAVENOUS | Status: AC
  Filled 2017-04-21: qty 20

## 2017-04-21 MED ORDER — ONDANSETRON HCL 4 MG/2ML IJ SOLN
INTRAMUSCULAR | Status: AC
  Filled 2017-04-21: qty 2

## 2017-04-21 MED ORDER — FAMOTIDINE 20 MG PO TABS
ORAL_TABLET | ORAL | Status: AC
  Administered 2017-04-21: 20 mg via ORAL
  Filled 2017-04-21: qty 1

## 2017-04-21 MED ORDER — OXYCODONE HCL 5 MG/5ML PO SOLN: 5.0000 mg | Freq: Once | ORAL | Status: DC | PRN

## 2017-04-21 MED ORDER — FENTANYL CITRATE (PF) 100 MCG/2ML IJ SOLN
INTRAMUSCULAR | Status: AC
  Filled 2017-04-21: qty 2

## 2017-04-21 MED ORDER — SUGAMMADEX SODIUM 200 MG/2ML IV SOLN
INTRAVENOUS | Status: DC | PRN
  Administered 2017-04-21: 140 mg via INTRAVENOUS

## 2017-04-21 MED ORDER — HYDROCODONE-ACETAMINOPHEN 5-325 MG PO TABS: 1.0000 | ORAL_TABLET | ORAL | 0 refills | Status: DC | PRN

## 2017-04-21 MED ORDER — SUGAMMADEX SODIUM 200 MG/2ML IV SOLN
INTRAVENOUS | Status: AC
  Filled 2017-04-21: qty 2

## 2017-04-21 MED ORDER — BUPIVACAINE-EPINEPHRINE (PF) 0.5% -1:200000 IJ SOLN
INTRAMUSCULAR | Status: AC
  Filled 2017-04-21: qty 30

## 2017-04-21 MED ORDER — HYDROCODONE-ACETAMINOPHEN 5-325 MG PO TABS: 1.0000 | ORAL_TABLET | ORAL | Status: DC | PRN

## 2017-04-21 MED ORDER — MEPERIDINE HCL 50 MG/ML IJ SOLN: 6.2500 mg | INTRAMUSCULAR | Status: DC | PRN

## 2017-04-21 MED ORDER — FENTANYL CITRATE (PF) 100 MCG/2ML IJ SOLN
INTRAMUSCULAR | Status: AC
  Administered 2017-04-21: 25 ug via INTRAVENOUS
  Filled 2017-04-21: qty 2

## 2017-04-21 MED ORDER — DEXAMETHASONE SODIUM PHOSPHATE 10 MG/ML IJ SOLN
INTRAMUSCULAR | Status: AC
  Filled 2017-04-21: qty 1

## 2017-04-21 MED ORDER — ROCURONIUM BROMIDE 100 MG/10ML IV SOLN
INTRAVENOUS | Status: DC | PRN
  Administered 2017-04-21: 50 mg via INTRAVENOUS

## 2017-04-21 MED ORDER — CEFAZOLIN SODIUM-DEXTROSE 2-4 GM/100ML-% IV SOLN
2.0000 g | Freq: Once | INTRAVENOUS | Status: AC
  Administered 2017-04-21: 2 g via INTRAVENOUS

## 2017-04-21 MED ORDER — PROMETHAZINE HCL 25 MG/ML IJ SOLN: 6.2500 mg | INTRAMUSCULAR | Status: DC | PRN

## 2017-04-21 MED ORDER — LIDOCAINE HCL (CARDIAC) 20 MG/ML IV SOLN
INTRAVENOUS | Status: DC | PRN
  Administered 2017-04-21: 50 mg via INTRAVENOUS

## 2017-04-21 MED ORDER — FENTANYL CITRATE (PF) 100 MCG/2ML IJ SOLN
25.0000 ug | INTRAMUSCULAR | Status: DC | PRN
  Administered 2017-04-21 (×4): 25 ug via INTRAVENOUS

## 2017-04-21 MED ORDER — FENTANYL CITRATE (PF) 100 MCG/2ML IJ SOLN
INTRAMUSCULAR | Status: DC | PRN
  Administered 2017-04-21: 25 ug via INTRAVENOUS
  Administered 2017-04-21: 75 ug via INTRAVENOUS

## 2017-04-21 MED ORDER — PROPOFOL 10 MG/ML IV BOLUS
INTRAVENOUS | Status: DC | PRN
  Administered 2017-04-21: 80 mg via INTRAVENOUS

## 2017-04-21 MED ORDER — FAMOTIDINE 20 MG PO TABS
20.0000 mg | ORAL_TABLET | Freq: Once | ORAL | Status: AC
  Administered 2017-04-21: 20 mg via ORAL

## 2017-04-21 MED ORDER — ONDANSETRON HCL 4 MG/2ML IJ SOLN
INTRAMUSCULAR | Status: DC | PRN
  Administered 2017-04-21: 4 mg via INTRAVENOUS

## 2017-04-21 MED ORDER — OXYCODONE HCL 5 MG PO TABS: 5.0000 mg | ORAL_TABLET | Freq: Once | ORAL | Status: DC | PRN

## 2017-04-21 MED ORDER — MIDAZOLAM HCL 2 MG/2ML IJ SOLN
INTRAMUSCULAR | Status: AC
  Filled 2017-04-21: qty 2

## 2017-04-21 MED ORDER — EPHEDRINE SULFATE 50 MG/ML IJ SOLN
INTRAMUSCULAR | Status: DC | PRN
  Administered 2017-04-21 (×3): 10 mg via INTRAVENOUS

## 2017-04-21 MED ORDER — BUPIVACAINE-EPINEPHRINE 0.5% -1:200000 IJ SOLN
INTRAMUSCULAR | Status: DC | PRN
  Administered 2017-04-21: 15 mL

## 2017-04-21 MED ORDER — DEXAMETHASONE SODIUM PHOSPHATE 10 MG/ML IJ SOLN
INTRAMUSCULAR | Status: DC | PRN
  Administered 2017-04-21: 4 mg via INTRAVENOUS

## 2017-04-21 MED ORDER — CEFAZOLIN SODIUM-DEXTROSE 2-4 GM/100ML-% IV SOLN
INTRAVENOUS | Status: AC
  Filled 2017-04-21: qty 100

## 2017-04-21 MED ORDER — ROCURONIUM BROMIDE 50 MG/5ML IV SOLN
INTRAVENOUS | Status: AC
  Filled 2017-04-21: qty 1

## 2017-04-21 MED ORDER — LIDOCAINE HCL (PF) 2 % IJ SOLN
INTRAMUSCULAR | Status: AC
  Filled 2017-04-21: qty 2

## 2017-04-21 SURGICAL SUPPLY — 25 items
CANISTER SUCT 1200ML W/VALVE (MISCELLANEOUS) ×3 IMPLANT
CHLORAPREP W/TINT 26ML (MISCELLANEOUS) ×3 IMPLANT
DERMABOND ADVANCED (GAUZE/BANDAGES/DRESSINGS) ×2
DERMABOND ADVANCED .7 DNX12 (GAUZE/BANDAGES/DRESSINGS) ×1 IMPLANT
DRAPE LAPAROTOMY 100X77 ABD (DRAPES) ×3 IMPLANT
ELECT REM PT RETURN 9FT ADLT (ELECTROSURGICAL) ×3
ELECTRODE REM PT RTRN 9FT ADLT (ELECTROSURGICAL) ×1 IMPLANT
GAUZE SPONGE 4X4 12PLY STRL (GAUZE/BANDAGES/DRESSINGS) IMPLANT
GLOVE BIO SURGEON STRL SZ7.5 (GLOVE) ×3 IMPLANT
GOWN STRL REUS W/ TWL LRG LVL3 (GOWN DISPOSABLE) ×3 IMPLANT
GOWN STRL REUS W/TWL LRG LVL3 (GOWN DISPOSABLE) ×6
KIT RM TURNOVER STRD PROC AR (KITS) ×3 IMPLANT
LABEL OR SOLS (LABEL) ×3 IMPLANT
MESH SYNTHETIC 4X6 SOFT BARD (Mesh General) ×1 IMPLANT
MESH SYNTHETIC SOFT BARD 4X6 (Mesh General) ×2 IMPLANT
NEEDLE HYPO 25X1 1.5 SAFETY (NEEDLE) ×3 IMPLANT
NS IRRIG 500ML POUR BTL (IV SOLUTION) ×3 IMPLANT
PACK BASIN MINOR ARMC (MISCELLANEOUS) ×3 IMPLANT
STAPLER SKIN PROX 35W (STAPLE) IMPLANT
SUT CHROMIC 3 0 SH 27 (SUTURE) ×3 IMPLANT
SUT MNCRL 4-0 (SUTURE) ×2
SUT MNCRL 4-0 27XMFL (SUTURE) ×1
SUT SURGILON 0 30 BLK (SUTURE) ×9 IMPLANT
SUTURE MNCRL 4-0 27XMF (SUTURE) ×1 IMPLANT
SYRINGE 10CC LL (SYRINGE) ×3 IMPLANT

## 2017-04-21 NOTE — Op Note (Signed)
OPERATIVE REPORT  PREOPERATIVE  DIAGNOSIS: . Ventral hernia  POSTOPERATIVE DIAGNOSIS: . Ventral hernia  PROCEDURE: . Ventral hernia repair  ANESTHESIA:  General  SURGEON: Rochel Brome  MD   INDICATIONS: . She has had recent bulging in the epigastrium. A ventral hernia was demonstrated on physical exam in the epigastrium at the site where she wants had chest tubes after cardiac surgery. Surgery was recommended for definitive treatment.  With the patient on the operating table in the supine position under general anesthesia the epigastrium was prepared with ChloraPrep and draped in a sterile manner. A transversely oriented 5.5 centimeter incision was made and carried down through subcutaneous tissues. Several small bleeding points were cauterized. The superficial fascia was incised. Dissection was carried out to encounter herniated properitoneal fat which was approximately 5 cm in dimension and smooth. This was dissected down to the fascial ring defect. The fatty tissue was incarcerated. The fascial ring defect was enlarged on each side by about 6 mm. With manipulation the hernia could then be reduced. Bard soft mesh was cut to create an oval shape of 3 x 5 cm. This was placed into the properitoneal plane oriented transversely. This was sutured to the overlying fascia with through and through 0 Surgilon sutures with 4 point fixation. Next the repair was carried out with a transversely oriented suture line of interrupted 0 Surgilon figure-of-eight sutures incorporating each suture into the mesh. The deep fascia and subcuticular tissues were infiltrated with half percent Sensorcaine with epinephrine. Superficial fascia was closed with interrupted 3-0 chromic. The skin was closed with running 4-0 Monocryl subcuticular suture and Dermabond.  The patient tolerated surgery satisfactorily and was then prepared for transfer to the recovery room  [facility].D.

## 2017-04-21 NOTE — Anesthesia Preprocedure Evaluation (Signed)
Anesthesia Evaluation  Patient identified by MRN, date of birth, ID band Patient awake    Reviewed: Allergy & Precautions, NPO status , Patient's Chart, lab work & pertinent test results  History of Anesthesia Complications Negative for: history of anesthetic complications  Airway Mallampati: II  TM Distance: >3 FB Neck ROM: Full    Dental  (+) Upper Dentures, Lower Dentures   Pulmonary neg sleep apnea, neg COPD, former smoker,    breath sounds clear to auscultation- rhonchi (-) wheezing      Cardiovascular hypertension, + CAD, + CABG (3v 2005) and +CHF  (-) Cardiac Stents + dysrhythmias Atrial Fibrillation Valvular problems/murmurs: repair of cor triatriatum an ASD repair   Rhythm:Regular Rate:Normal - Systolic murmurs and - Diastolic murmurs Echo 08/06/82: Chronically occluded lad with akinesis of anteoapex. Patent svg to diagonal with flow to lad Insignificant disease in rca and lcx with no visible patent graft EF 30-35% with apical akinesis Moderate pulmonary hypertension   Neuro/Psych  Headaches, TIAnegative psych ROS   GI/Hepatic Neg liver ROS, GERD  ,  Endo/Other  diabetes, Oral Hypoglycemic AgentsHypothyroidism   Renal/GU negative Renal ROS     Musculoskeletal  (+) Arthritis ,   Abdominal (+) - obese,   Peds  Hematology  (+) anemia ,   Anesthesia Other Findings Past Medical History: No date: Anemia No date: Arthritis No date: Atrial fibrillation (HCC) No date: Cardiomyopathy ([place]) No date: CHF (congestive heart failure) (HCC) No date: Coronary artery disease No date: Diabetes mellitus without complication (HCC) No date: Dyspnea     Comment: with exertion No date: Dysrhythmia No date: GERD (gastroesophageal reflux disease) No date: Headache     Comment: migraines No date: Heart murmur 2004: History of shingles No date: Hyperlipidemia No date: Hypertension No date: Hypothyroidism No date: IBS  (irritable bowel syndrome) No date: TIA (transient ischemic attack) No date: Vitamin B 12 deficiency   Reproductive/Obstetrics                             Anesthesia Physical Anesthesia Plan  ASA: III  Anesthesia Plan: General   Post-op Pain Management:    Induction: Intravenous  Airway Management Planned: Oral ETT  Additional Equipment:   Intra-op Plan:   Post-operative Plan: Extubation in OR  Informed Consent: I have reviewed the patients History and Physical, chart, labs and discussed the procedure including the risks, benefits and alternatives for the proposed anesthesia with the patient or authorized representative who has indicated his/her understanding and acceptance.   Dental advisory given  Plan Discussed with: CRNA and Anesthesiologist  Anesthesia Plan Comments:         Anesthesia Quick Evaluation

## 2017-04-21 NOTE — H&P (Signed)
  She comes in prepared for ventral hernia repair. She reports no change in overall condition since the recent office exam.  The hernia was demonstrated in the epigastrium at the site of chest tube scars.  Lab work noted, discussed her known anemia. She has recently started taking B12 injections.  I discussed the plan for ventral hernia repair

## 2017-04-21 NOTE — Discharge Instructions (Addendum)
Take Tylenol or Norco if needed for pain.  Should not drive or do anything dangerous when taking Norco.  Avoid straining and heavy lifting.  May shower.   AMBULATORY SURGERY  DISCHARGE INSTRUCTIONS   1) The drugs that you were given will stay in your system until tomorrow so for the next 24 hours you should not:  A) Drive an automobile B) Make any legal decisions C) Drink any alcoholic beverage   2) You may resume regular meals tomorrow.  Today it is better to start with liquids and gradually work up to solid foods.  You may eat anything you prefer, but it is better to start with liquids, then soup and crackers, and gradually work up to solid foods.   3) Please notify your doctor immediately if you have any unusual bleeding, trouble breathing, redness and pain at the surgery site, drainage, fever, or pain not relieved by medication.   4) Additional Instructions:splint abdominal incision when coughing, sneezing or moving.

## 2017-04-21 NOTE — Anesthesia Post-op Follow-up Note (Cosign Needed)
Anesthesia QCDR form completed.        

## 2017-04-21 NOTE — Transfer of Care (Signed)
Immediate Anesthesia Transfer of Care Note  Patient: Tammy Arias  Procedure(s) Performed: Procedure(s): HERNIA REPAIR VENTRAL ADULT (N/A)  Patient Location: PACU  Anesthesia Type:General  Level of Consciousness: responds to stimulation  Airway & Oxygen Therapy: Patient Spontanous Breathing and Patient connected to nasal cannula oxygen  Post-op Assessment: Report given to RN and Post -op Vital signs reviewed and stable  Post vital signs: Reviewed and stable  Last Vitals:  Vitals:   04/21/17 0838 04/21/17 1038  BP:  118/69  Pulse:  93  Resp:  18  Temp: (!) 35.6 C 36.4 C    Last Pain:  Vitals:   04/21/17 1038  TempSrc: Temporal  PainSc: 0-No pain         Complications: No apparent anesthesia complications

## 2017-04-21 NOTE — OR Nursing (Signed)
1310 Dr. Tamala Julian visited.  Ok'd dc home.

## 2017-04-21 NOTE — Anesthesia Procedure Notes (Signed)
Procedure Name: Intubation Date/Time: 04/21/2017 9:36 AM Performed by: Darlyne Russian Pre-anesthesia Checklist: Patient identified, Emergency Drugs available, Suction available, Patient being monitored and Timeout performed Patient Re-evaluated:Patient Re-evaluated prior to inductionOxygen Delivery Method: Circle system utilized Preoxygenation: Pre-oxygenation with 100% oxygen Intubation Type: IV induction Ventilation: Mask ventilation without difficulty Laryngoscope Size: Mac and 3 Grade View: Grade I Tube type: Oral Tube size: 7.0 mm Number of attempts: 1 Airway Equipment and Method: Stylet (soft bite block) Placement Confirmation: ETT inserted through vocal cords under direct vision,  positive ETCO2 and breath sounds checked- equal and bilateral Secured at: 20 cm Tube secured with: Tape Dental Injury: Teeth and Oropharynx as per pre-operative assessment

## 2017-04-21 NOTE — Anesthesia Postprocedure Evaluation (Signed)
Anesthesia Post Note  Patient: Tammy Arias  Procedure(s) Performed: Procedure(s) (LRB): HERNIA REPAIR VENTRAL ADULT (N/A)  Patient location during evaluation: PACU Anesthesia Type: General Level of consciousness: awake and alert and oriented Pain management: pain level controlled Vital Signs Assessment: post-procedure vital signs reviewed and stable Respiratory status: spontaneous breathing, nonlabored ventilation and respiratory function stable Cardiovascular status: blood pressure returned to baseline and stable Postop Assessment: no signs of nausea or vomiting Anesthetic complications: no     Last Vitals:  Vitals:   04/21/17 1138 04/21/17 1148  BP: 108/66 114/65  Pulse: 75 80  Resp: 16 18  Temp: 36.6 C (!) 36 C    Last Pain:  Vitals:   04/21/17 1148  TempSrc: Temporal  PainSc: 0-No pain                 Lirio Bach

## 2017-05-01 ENCOUNTER — Ambulatory Visit: Payer: BLUE CROSS/BLUE SHIELD

## 2017-05-04 ENCOUNTER — Ambulatory Visit: Payer: BLUE CROSS/BLUE SHIELD

## 2017-06-21 ENCOUNTER — Other Ambulatory Visit: Payer: Self-pay | Admitting: *Deleted

## 2017-06-21 ENCOUNTER — Ambulatory Visit
Admission: RE | Admit: 2017-06-21 | Discharge: 2017-06-21 | Disposition: A | Discharge status: Home / Self Care | Payer: BLUE CROSS/BLUE SHIELD | Source: Ambulatory Visit | Attending: Medical | Admitting: Medical

## 2017-06-21 ENCOUNTER — Ambulatory Visit
Admission: RE | Admit: 2017-06-21 | Discharge: 2017-06-21 | Disposition: A | Discharge status: Home / Self Care | Payer: BLUE CROSS/BLUE SHIELD | Source: Ambulatory Visit | Attending: *Deleted | Admitting: *Deleted

## 2017-06-21 ENCOUNTER — Other Ambulatory Visit: Payer: Self-pay | Admitting: Medical

## 2017-06-21 DIAGNOSIS — J069 Acute upper respiratory infection, unspecified: Secondary | ICD-10-CM

## 2017-06-21 DIAGNOSIS — J9811 Atelectasis: Secondary | ICD-10-CM | POA: Insufficient documentation

## 2017-06-21 DIAGNOSIS — I517 Cardiomegaly: Secondary | ICD-10-CM | POA: Diagnosis not present

## 2017-07-16 ENCOUNTER — Emergency Department
Admission: EM | Admit: 2017-07-16 | Discharge: 2017-07-16 | Disposition: A | Discharge status: Home / Self Care | Payer: BLUE CROSS/BLUE SHIELD | Attending: Emergency Medicine | Admitting: Emergency Medicine

## 2017-07-16 DIAGNOSIS — Z7982 Long term (current) use of aspirin: Secondary | ICD-10-CM | POA: Insufficient documentation

## 2017-07-16 DIAGNOSIS — I83899 Varicose veins of unspecified lower extremities with other complications: Secondary | ICD-10-CM | POA: Insufficient documentation

## 2017-07-16 DIAGNOSIS — I10 Essential (primary) hypertension: Secondary | ICD-10-CM | POA: Insufficient documentation

## 2017-07-16 DIAGNOSIS — Z7901 Long term (current) use of anticoagulants: Secondary | ICD-10-CM | POA: Insufficient documentation

## 2017-07-16 DIAGNOSIS — I509 Heart failure, unspecified: Secondary | ICD-10-CM | POA: Insufficient documentation

## 2017-07-16 DIAGNOSIS — Z87891 Personal history of nicotine dependence: Secondary | ICD-10-CM | POA: Diagnosis not present

## 2017-07-16 DIAGNOSIS — Z7984 Long term (current) use of oral hypoglycemic drugs: Secondary | ICD-10-CM | POA: Insufficient documentation

## 2017-07-16 DIAGNOSIS — E119 Type 2 diabetes mellitus without complications: Secondary | ICD-10-CM | POA: Diagnosis not present

## 2017-07-16 DIAGNOSIS — I2510 Atherosclerotic heart disease of native coronary artery without angina pectoris: Secondary | ICD-10-CM | POA: Insufficient documentation

## 2017-07-16 DIAGNOSIS — E039 Hypothyroidism, unspecified: Secondary | ICD-10-CM | POA: Diagnosis not present

## 2017-07-16 MED ORDER — SILVER NITRATE-POT NITRATE 75-25 % EX MISC
CUTANEOUS | Status: DC
Start: 2017-07-16 — End: 2017-07-16
  Filled 2017-07-16: qty 2

## 2017-07-16 MED ORDER — LIDOCAINE-EPINEPHRINE-TETRACAINE (LET) SOLUTION
3.0000 mL | Freq: Once | NASAL | Status: DC
  Filled 2017-07-16: qty 3

## 2017-07-16 NOTE — ED Provider Notes (Signed)
[facility] Emergency Department Provider Note  ____________________________________________  Time seen: Approximately 9:30 PM  I have reviewed the triage vital signs and the nursing notes.   HISTORY  Chief Complaint varicose vein    HPI Tammy Arias is a 61 y.o. female that presents to the emergency department with right leg bleeding today. She states that she was scratching at her leg and thought it was a scab but it was a varicose vein. She takes warfarin. Last INR was 2.5.   Past Medical History:  Diagnosis Date  . Anemia   . Arthritis   . Atrial fibrillation ([place])   . Cardiomyopathy ([place])   . CHF (congestive heart failure) ([place])   . Coronary artery disease   . Diabetes mellitus without complication ([place])   . Dyspnea    with exertion  . Dysrhythmia   . GERD (gastroesophageal reflux disease)   . Headache    migraines  . Heart murmur   . History of shingles 2004  . Hyperlipidemia   . Hypertension   . Hypothyroidism   . IBS (irritable bowel syndrome)   . TIA (transient ischemic attack)   . Vitamin B 12 deficiency     Patient Active Problem List   Diagnosis Date Noted  . Pulmonary hypertension ([place]) 03/29/2017  . Coronary artery disease involving native coronary artery of native heart with unstable angina pectoris ([place]) 03/21/2017  . Ischemic cardiomyopathy 03/21/2017  . A-fib ([place]) 10/08/2014    Past Surgical History:  Procedure Laterality Date  . CARDIAC CATHETERIZATION    . CORONARY ARTERY BYPASS GRAFT    . EYE SURGERY Bilateral    Cataract Extraction with IOL  . RIGHT/LEFT HEART CATH AND CORONARY ANGIOGRAPHY Bilateral 03/29/2017   Procedure: Right/Left Heart Cath and Coronary Angiography;  Surgeon: Teodoro Spray, MD;  Location: [facility];  Service: Cardiovascular;  Laterality: Bilateral;  . TUBAL LIGATION    . VENTRAL HERNIA REPAIR N/A 04/21/2017   Procedure: HERNIA REPAIR VENTRAL ADULT;  Surgeon: Leonie Green, MD;  Location: [facility];  Service: General;  Laterality: N/A;    Prior to Admission medications   Medication Sig Start Date End Date Taking? Authorizing Provider  albuterol (PROVENTIL HFA;VENTOLIN HFA) 108 (90 Base) MCG/ACT inhaler Inhale 2 puffs into the lungs every 6 (six) hours as needed for wheezing or shortness of breath.    [provider]  ALPRAZolam Duanne Moron) 1 MG tablet Take 1 mg by mouth daily as needed for anxiety.    [provider]  aspirin EC 81 MG tablet Take 81 mg by mouth daily.    [provider]  carvedilol (COREG) 3.125 MG tablet Take 3.125 mg by mouth 2 (two) times daily with a meal.    [provider]  Cyanocobalamin (VITAMIN B 12 PO) Take 1 tablet by mouth daily.    [provider]  furosemide (LASIX) 40 MG tablet Take 40 mg by mouth daily.    [provider]  HYDROcodone-acetaminophen (NORCO) 5-325 MG tablet Take 1-2 tablets by mouth every 4 (four) hours as needed for moderate pain. 04/21/17   Leonie Green, MD  latanoprost (XALATAN) 0.005 % ophthalmic solution Place 1 drop into both eyes at bedtime.    [provider]  levothyroxine (SYNTHROID, LEVOTHROID) 75 MCG tablet Take 75 mcg by mouth daily before breakfast.    [provider]  lisinopril (PRINIVIL,ZESTRIL) 5 MG tablet Take 5 mg by mouth daily.    [provider]  lovastatin (MEVACOR) 10 MG tablet Take 10 mg by mouth at bedtime.    [provider]  metFORMIN (GLUCOPHAGE) 1000 MG tablet Take 1,000 mg by mouth 2 (two) times daily with a meal.    [provider]  warfarin (COUMADIN) 3 MG tablet Take 3 mg by mouth at bedtime.    [provider]    Allergies Vioxx [rofecoxib]  Family History  Problem Relation Age of Onset  . Cancer Sister        lung  . Cancer Sister        cervical  . Valvular heart disease Mother   . Diabetes Father   . Breast cancer Neg Hx     Social History Social  History  Substance Use Topics  . Smoking status: Former Smoker    Types: Cigarettes    Quit date: 03/30/1999  . Smokeless tobacco: Never Used  . Alcohol use No     Review of Systems  Constitutional: No fever/chills Cardiovascular: No chest pain. Respiratory:  No SOB. Gastrointestinal: No abdominal pain.  No nausea, no vomiting.  Skin: Negative for rash, ecchymosis. Neurological: Negative for headaches, numbness or tingling   ____________________________________________   PHYSICAL EXAM:  VITAL SIGNS: ED Triage Vitals  Enc Vitals Group     BP 07/16/17 1852 (!) 119/49     Pulse Rate 07/16/17 1852 (!) 101     Resp 07/16/17 1852 18     Temp 07/16/17 1852 98.8 F (37.1 C)     Temp Source 07/16/17 1852 Oral     SpO2 07/16/17 1852 98 %     Weight 07/16/17 1853 150 lb (68 kg)     Height --      Head Circumference --      Peak Flow --      Pain Score --      Pain Loc --      Pain Edu? --      Excl. in [place]? --      Constitutional: Alert and oriented. Well appearing and in no acute distress. Eyes: Conjunctivae are normal. PERRL. EOMI. Head: Atraumatic. ENT:      Ears:      Nose: No congestion/rhinnorhea.      Mouth/Throat: Mucous membranes are moist.  Neck: No stridor.  Cardiovascular: Normal rate, regular rhythm.  Good peripheral circulation. Respiratory: Normal respiratory effort without tachypnea or retractions. Lungs CTAB. Good air entry to the bases with no decreased or absent breath sounds. Musculoskeletal: Full range of motion to all extremities. No gross deformities appreciated. Neurologic:  Normal speech and language. No gross focal neurologic deficits are appreciated.  Skin:  Skin is warm, dry. 1 mm defect in skin that is oozing blood to lower right shin  ____________________________________________   LABS (all labs ordered are listed, but only abnormal results are displayed)  Labs Reviewed - No data to  display ____________________________________________  EKG   ____________________________________________  RADIOLOGY  No results found.  ____________________________________________    PROCEDURES  Procedure(s) performed:    Procedures    Medications  lidocaine-EPINEPHrine-tetracaine (LET) solution (not administered)     ____________________________________________   INITIAL IMPRESSION / ASSESSMENT AND PLAN / ED COURSE  Pertinent labs & imaging results that were available during my care of the patient were reviewed by me and considered in my medical decision making (see chart for details).  Review of the [place] CSRS was performed in accordance of the [place] prior to dispensing any controlled drugs.  Patient presented  to the emergency department for evaluation of bleeding varicose vein. Vital signs and exam are reassuring. Bleeding stopped with pressure, LET, Dermabond. Pressure wrap was applied and patient was instructed to leave on pressure dressing for 48 hours. Patient is to follow up with PCP as directed. Patient is given ED precautions to return to the ED for any worsening or new symptoms.   ____________________________________________  FINAL CLINICAL IMPRESSION(S) / ED DIAGNOSES  Final diagnoses:  Bleeding from varicose vein      NEW MEDICATIONS STARTED DURING THIS VISIT:  Discharge Medication List as of 07/16/2017 10:44 PM          This chart was dictated using voice recognition software/Dragon. Despite best efforts to proofread, errors can occur which can change the meaning. Any change was purely unintentional.    Laban Emperor, PA-C 07/16/17 2304    Arta Silence, MD 07/17/17 [phone]

## 2017-07-16 NOTE — ED Notes (Signed)
Dressing placed and ace wrap applied

## 2017-07-16 NOTE — ED Notes (Signed)
Pt has a varicose vein that is bleeding - controlled with pressure but starts again when unwrapped. Pt is on blood thinners.

## 2017-07-16 NOTE — ED Notes (Signed)
First Nurse: pt c/o picking a scab on her leg which ended up being a blood vessel, states started "spurting" blood, pt called EMS, the arrived to home and placed a pressure bandage to site and advised to pt come to ER for further eval. Pt states she is taking Coumadin.

## 2017-07-16 NOTE — ED Triage Notes (Signed)
Pt had varicose vein to right lower calf that she scratched. EMS arrived to house and wrapped leg. Pt takes warfarin. Bleeding controlled at time of arrival. Pt alert and oriented X4, active, cooperative, pt in NAD. RR even and unlabored, color WNL.

## 2017-07-27 ENCOUNTER — Ambulatory Visit
Admission: RE | Admit: 2017-07-27 | Discharge: 2017-07-27 | Disposition: A | Discharge status: Home / Self Care | Payer: BLUE CROSS/BLUE SHIELD | Source: Ambulatory Visit | Attending: Family Medicine | Admitting: Family Medicine

## 2017-07-27 DIAGNOSIS — Z1231 Encounter for screening mammogram for malignant neoplasm of breast: Secondary | ICD-10-CM | POA: Insufficient documentation

## 2018-05-07 ENCOUNTER — Other Ambulatory Visit: Payer: Self-pay | Admitting: Family Medicine

## 2018-05-07 DIAGNOSIS — Z1231 Encounter for screening mammogram for malignant neoplasm of breast: Secondary | ICD-10-CM

## 2018-07-31 ENCOUNTER — Ambulatory Visit
Admission: RE | Admit: 2018-07-31 | Discharge: 2018-07-31 | Disposition: A | Discharge status: Home / Self Care | Payer: BLUE CROSS/BLUE SHIELD | Source: Ambulatory Visit | Attending: Family Medicine | Admitting: Family Medicine

## 2018-07-31 DIAGNOSIS — Z1231 Encounter for screening mammogram for malignant neoplasm of breast: Secondary | ICD-10-CM

## 2018-12-17 ENCOUNTER — Other Ambulatory Visit: Payer: Self-pay

## 2018-12-17 ENCOUNTER — Emergency Department: Payer: BLUE CROSS/BLUE SHIELD

## 2018-12-17 ENCOUNTER — Inpatient Hospital Stay
Admission: EM | Admit: 2018-12-17 | Discharge: 2018-12-20 | DRG: 377 | Disposition: A | Discharge status: Home / Self Care | Payer: BLUE CROSS/BLUE SHIELD | Attending: Internal Medicine | Admitting: Internal Medicine

## 2018-12-17 ENCOUNTER — Encounter: Payer: Self-pay | Admitting: Emergency Medicine

## 2018-12-17 DIAGNOSIS — D509 Iron deficiency anemia, unspecified: Secondary | ICD-10-CM

## 2018-12-17 DIAGNOSIS — Z7901 Long term (current) use of anticoagulants: Secondary | ICD-10-CM

## 2018-12-17 DIAGNOSIS — E039 Hypothyroidism, unspecified: Secondary | ICD-10-CM | POA: Diagnosis present

## 2018-12-17 DIAGNOSIS — Z79899 Other long term (current) drug therapy: Secondary | ICD-10-CM

## 2018-12-17 DIAGNOSIS — Z951 Presence of aortocoronary bypass graft: Secondary | ICD-10-CM

## 2018-12-17 DIAGNOSIS — Z87891 Personal history of nicotine dependence: Secondary | ICD-10-CM | POA: Diagnosis not present

## 2018-12-17 DIAGNOSIS — I110 Hypertensive heart disease with heart failure: Secondary | ICD-10-CM | POA: Diagnosis present

## 2018-12-17 DIAGNOSIS — D649 Anemia, unspecified: Secondary | ICD-10-CM | POA: Diagnosis not present

## 2018-12-17 DIAGNOSIS — I480 Paroxysmal atrial fibrillation: Secondary | ICD-10-CM | POA: Diagnosis present

## 2018-12-17 DIAGNOSIS — Z8673 Personal history of transient ischemic attack (TIA), and cerebral infarction without residual deficits: Secondary | ICD-10-CM

## 2018-12-17 DIAGNOSIS — I255 Ischemic cardiomyopathy: Secondary | ICD-10-CM | POA: Diagnosis present

## 2018-12-17 DIAGNOSIS — K921 Melena: Secondary | ICD-10-CM

## 2018-12-17 DIAGNOSIS — I5043 Acute on chronic combined systolic (congestive) and diastolic (congestive) heart failure: Secondary | ICD-10-CM | POA: Diagnosis present

## 2018-12-17 DIAGNOSIS — Z7984 Long term (current) use of oral hypoglycemic drugs: Secondary | ICD-10-CM | POA: Diagnosis not present

## 2018-12-17 DIAGNOSIS — K219 Gastro-esophageal reflux disease without esophagitis: Secondary | ICD-10-CM | POA: Diagnosis present

## 2018-12-17 DIAGNOSIS — K7460 Unspecified cirrhosis of liver: Secondary | ICD-10-CM | POA: Diagnosis not present

## 2018-12-17 DIAGNOSIS — Z8249 Family history of ischemic heart disease and other diseases of the circulatory system: Secondary | ICD-10-CM

## 2018-12-17 DIAGNOSIS — K5520 Angiodysplasia of colon without hemorrhage: Secondary | ICD-10-CM

## 2018-12-17 DIAGNOSIS — K228 Other specified diseases of esophagus: Secondary | ICD-10-CM | POA: Diagnosis not present

## 2018-12-17 DIAGNOSIS — E119 Type 2 diabetes mellitus without complications: Secondary | ICD-10-CM | POA: Diagnosis present

## 2018-12-17 DIAGNOSIS — K648 Other hemorrhoids: Secondary | ICD-10-CM

## 2018-12-17 DIAGNOSIS — K31811 Angiodysplasia of stomach and duodenum with bleeding: Secondary | ICD-10-CM | POA: Diagnosis present

## 2018-12-17 DIAGNOSIS — Z7982 Long term (current) use of aspirin: Secondary | ICD-10-CM

## 2018-12-17 DIAGNOSIS — I509 Heart failure, unspecified: Secondary | ICD-10-CM

## 2018-12-17 DIAGNOSIS — I2510 Atherosclerotic heart disease of native coronary artery without angina pectoris: Secondary | ICD-10-CM | POA: Diagnosis present

## 2018-12-17 DIAGNOSIS — I482 Chronic atrial fibrillation, unspecified: Secondary | ICD-10-CM | POA: Diagnosis present

## 2018-12-17 DIAGNOSIS — Z833 Family history of diabetes mellitus: Secondary | ICD-10-CM

## 2018-12-17 DIAGNOSIS — I498 Other specified cardiac arrhythmias: Secondary | ICD-10-CM

## 2018-12-17 DIAGNOSIS — K31819 Angiodysplasia of stomach and duodenum without bleeding: Secondary | ICD-10-CM

## 2018-12-17 DIAGNOSIS — Z79890 Hormone replacement therapy (postmenopausal): Secondary | ICD-10-CM

## 2018-12-17 DIAGNOSIS — E785 Hyperlipidemia, unspecified: Secondary | ICD-10-CM | POA: Diagnosis present

## 2018-12-17 DIAGNOSIS — K2270 Barrett's esophagus without dysplasia: Secondary | ICD-10-CM

## 2018-12-17 DIAGNOSIS — E538 Deficiency of other specified B group vitamins: Secondary | ICD-10-CM | POA: Diagnosis present

## 2018-12-17 DIAGNOSIS — K922 Gastrointestinal hemorrhage, unspecified: Secondary | ICD-10-CM

## 2018-12-17 DIAGNOSIS — R1013 Epigastric pain: Secondary | ICD-10-CM

## 2018-12-17 DIAGNOSIS — K5521 Angiodysplasia of colon with hemorrhage: Secondary | ICD-10-CM | POA: Diagnosis present

## 2018-12-17 HISTORY — DX: Melena: K92.1

## 2018-12-17 LAB — CBC WITH DIFFERENTIAL/PLATELET
Abs Immature Granulocytes: 0.01 10*3/uL (ref 0.00–0.07)
Basophils Absolute: 0 10*3/uL (ref 0.0–0.1)
Basophils Relative: 1 %
Eosinophils Absolute: 0.1 10*3/uL (ref 0.0–0.5)
Eosinophils Relative: 4 %
HCT: 28.2 % — ABNORMAL LOW (ref 36.0–46.0)
Hemoglobin: 8.4 g/dL — ABNORMAL LOW (ref 12.0–15.0)
IMMATURE GRANULOCYTES: 0 %
Lymphocytes Relative: 12 %
Lymphs Abs: 0.4 10*3/uL — ABNORMAL LOW (ref 0.7–4.0)
MCH: 25.5 pg — ABNORMAL LOW (ref 26.0–34.0)
MCHC: 29.8 g/dL — ABNORMAL LOW (ref 30.0–36.0)
MCV: 85.7 fL (ref 80.0–100.0)
MONOS PCT: 14 %
Monocytes Absolute: 0.5 10*3/uL (ref 0.1–1.0)
Neutro Abs: 2.4 10*3/uL (ref 1.7–7.7)
Neutrophils Relative %: 69 %
Platelets: 180 10*3/uL (ref 150–400)
RBC: 3.29 MIL/uL — ABNORMAL LOW (ref 3.87–5.11)
RDW: 17.4 % — AB (ref 11.5–15.5)
WBC: 3.5 10*3/uL — ABNORMAL LOW (ref 4.0–10.5)
nRBC: 0 % (ref 0.0–0.2)

## 2018-12-17 LAB — PROTIME-INR
INR: 1.88
Prothrombin Time: 21.4 seconds — ABNORMAL HIGH (ref 11.4–15.2)

## 2018-12-17 LAB — COMPREHENSIVE METABOLIC PANEL
ALBUMIN: 4 g/dL (ref 3.5–5.0)
ALK PHOS: 89 U/L (ref 38–126)
ALT: 26 U/L (ref 0–44)
AST: 45 U/L — ABNORMAL HIGH (ref 15–41)
Anion gap: 7 (ref 5–15)
BUN: 20 mg/dL (ref 8–23)
CALCIUM: 8.9 mg/dL (ref 8.9–10.3)
CO2: 28 mmol/L (ref 22–32)
Chloride: 103 mmol/L (ref 98–111)
Creatinine, Ser: 0.94 mg/dL (ref 0.44–1.00)
GFR calc Af Amer: >60 mL/min (ref >60)
GFR calc non Af Amer: >60 mL/min (ref >60)
Glucose, Bld: 157 mg/dL — ABNORMAL HIGH (ref 70–99)
Potassium: 4.3 mmol/L (ref 3.5–5.1)
SODIUM: 138 mmol/L (ref 135–145)
Total Bilirubin: 0.7 mg/dL (ref 0.3–1.2)
Total Protein: 6.9 g/dL (ref 6.5–8.1)

## 2018-12-17 LAB — TROPONIN I: Troponin I: <0.03 ng/mL (ref <0.03)

## 2018-12-17 LAB — LIPASE, BLOOD: Lipase: 53 U/L — ABNORMAL HIGH (ref 11–51)

## 2018-12-17 LAB — BRAIN NATRIURETIC PEPTIDE: B NATRIURETIC PEPTIDE 5: 500 pg/mL — AB (ref 0.0–100.0)

## 2018-12-17 MED ORDER — FUROSEMIDE 10 MG/ML IJ SOLN
80.0000 mg | Freq: Once | INTRAMUSCULAR | Status: AC
  Administered 2018-12-17: 80 mg via INTRAVENOUS
  Filled 2018-12-17: qty 8

## 2018-12-17 MED ORDER — SODIUM CHLORIDE 0.9 % IV SOLN
8.0000 mg/h | INTRAVENOUS | Status: DC
  Administered 2018-12-17 – 2018-12-19 (×4): 8 mg/h via INTRAVENOUS
  Filled 2018-12-17 (×5): qty 80

## 2018-12-17 MED ORDER — SODIUM CHLORIDE 0.9% FLUSH
3.0000 mL | Freq: Once | INTRAVENOUS | Status: AC
  Administered 2018-12-17: 3 mL via INTRAVENOUS

## 2018-12-17 MED ORDER — IOHEXOL 300 MG/ML  SOLN
100.0000 mL | Freq: Once | INTRAMUSCULAR | Status: AC | PRN
  Administered 2018-12-17: 100 mL via INTRAVENOUS

## 2018-12-17 MED ORDER — PANTOPRAZOLE SODIUM 40 MG IV SOLR: 40.0000 mg | Freq: Two times a day (BID) | INTRAVENOUS | Status: DC

## 2018-12-17 MED ORDER — SODIUM CHLORIDE 0.9 % IV SOLN
80.0000 mg | Freq: Once | INTRAVENOUS | Status: AC
  Administered 2018-12-17: 80 mg via INTRAVENOUS
  Filled 2018-12-17: qty 40

## 2018-12-17 NOTE — ED Notes (Signed)
States does take coumadin for a fib. Denies black stools or vomiting blood.

## 2018-12-17 NOTE — ED Triage Notes (Signed)
SOB with exertion x several months. Sent from MD office due to falling HGB. Denies chest pain.

## 2018-12-17 NOTE — ED Provider Notes (Addendum)
[facility] Emergency Department Provider Note  ____________________________________________  Time seen: Approximately 7:50 PM  I have reviewed the triage vital signs and the nursing notes.   HISTORY  Chief Complaint Shortness of Breath   HPI Tammy Arias is a 63 y.o. female with a history of A. fib on Coumadin, CHF with EF of 35%, CAD, diabetes, anemia, hypertension, hyperlipidemia who presents at the request of her primary care doctor for worsening anemia.  Patient reports a month of progressively worsening dyspnea on exertion and generalized weakness which prompted her visit to PCP where she was found to have hgb 8.7 (11.6 in 03/2018).  She reports for the last 3 days she has had 3-4 episodes of dark watery diarrhea.  She denies any prior history of GI bleed.  No nausea or vomiting.  She also has had abdominal pain however that has been constant for several months.  She describes as sharp constant pain in the upper quadrants that is unchanged by eating.  She reports having a colonoscopy several years ago.  She denies family history of colon cancer.  Currently her pain is mild.  She denies constipation, dysuria or hematuria, chest pain.  She denies orthopnea.  She does report mild weight gain and swelling in her lower extremities.  Her cardiologist recently increased her spironolactone to 25mg  daily and Lasix to 80 mg daily for her fluid retention.   Past Medical History:  Diagnosis Date  . Anemia   . Arthritis   . Atrial fibrillation ([place])   . Cardiomyopathy ([place])   . CHF (congestive heart failure) ([place])   . Coronary artery disease   . Diabetes mellitus without complication ([place])   . Dyspnea    with exertion  . Dysrhythmia   . GERD (gastroesophageal reflux disease)   . Headache    migraines  . Heart murmur   . History of shingles 2004  . Hyperlipidemia   . Hypertension   . Hypothyroidism   . IBS (irritable bowel syndrome)   . TIA (transient  ischemic attack)   . Vitamin B 12 deficiency     Patient Active Problem List   Diagnosis Date Noted  . Pulmonary hypertension ([place]) 03/29/2017  . Coronary artery disease involving native coronary artery of native heart with unstable angina pectoris ([place]) 03/21/2017  . Ischemic cardiomyopathy 03/21/2017  . A-fib ([place]) 10/08/2014    Past Surgical History:  Procedure Laterality Date  . CARDIAC CATHETERIZATION    . CORONARY ARTERY BYPASS GRAFT    . EYE SURGERY Bilateral    Cataract Extraction with IOL  . RIGHT/LEFT HEART CATH AND CORONARY ANGIOGRAPHY Bilateral 03/29/2017   Procedure: Right/Left Heart Cath and Coronary Angiography;  Surgeon: Teodoro Spray, MD;  Location: [facility];  Service: Cardiovascular;  Laterality: Bilateral;  . TUBAL LIGATION    . VENTRAL HERNIA REPAIR N/A 04/21/2017   Procedure: HERNIA REPAIR VENTRAL ADULT;  Surgeon: Leonie Green, MD;  Location: [facility];  Service: General;  Laterality: N/A;    Prior to Admission medications   Medication Sig Start Date End Date Taking? Authorizing Provider  aspirin EC 81 MG tablet Take 81 mg by mouth daily.   Yes [provider]  carvedilol (COREG) 3.125 MG tablet Take 3.125 mg by mouth 2 (two) times daily with a meal.   Yes [provider]  Cyanocobalamin (VITAMIN B 12 PO) Take 1 tablet by mouth daily.   Yes [provider]  furosemide (LASIX) 40 MG  tablet Take 80 mg by mouth daily.    Yes [provider]  latanoprost (XALATAN) 0.005 % ophthalmic solution Place 1 drop into both eyes at bedtime.   Yes [provider]  levothyroxine (SYNTHROID, LEVOTHROID) 75 MCG tablet Take 75 mcg by mouth daily before breakfast.   Yes [provider]  lisinopril (PRINIVIL,ZESTRIL) 5 MG tablet Take 5 mg by mouth daily.   Yes [provider]  lovastatin (MEVACOR) 10 MG tablet Take 10 mg by mouth at bedtime.   Yes [provider]  metFORMIN (GLUCOPHAGE) 1000 MG  tablet Take 1,000 mg by mouth 2 (two) times daily with a meal.   Yes [provider]  spironolactone (ALDACTONE) 25 MG tablet Take 25 mg by mouth daily.   Yes [provider]  warfarin (COUMADIN) 3 MG tablet Take 1.5-3 mg by mouth at bedtime.    Yes [provider]  albuterol (PROVENTIL HFA;VENTOLIN HFA) 108 (90 Base) MCG/ACT inhaler Inhale 2 puffs into the lungs every 6 (six) hours as needed for wheezing or shortness of breath.    [provider]  ALPRAZolam Duanne Moron) 1 MG tablet Take 1 mg by mouth daily as needed for anxiety.    [provider]  HYDROcodone-acetaminophen (NORCO) 5-325 MG tablet Take 1-2 tablets by mouth every 4 (four) hours as needed for moderate pain. Patient not taking: Reported on 12/17/2018 04/21/17   Leonie Green, MD    Allergies Vioxx [rofecoxib]  Family History  Problem Relation Age of Onset  . Cancer Sister        lung  . Cancer Sister        cervical  . Valvular heart disease Mother   . Diabetes Father   . Breast cancer Neg Hx     Social History Social History   Tobacco Use  . Smoking status: Former Smoker    Types: Cigarettes    Last attempt to quit: 03/30/1999    Years since quitting: 19.7  . Smokeless tobacco: Never Used  Substance Use Topics  . Alcohol use: No  . Drug use: No    Review of Systems  Constitutional: Negative for fever. + generalized weakness Eyes: Negative for visual changes. ENT: Negative for sore throat. Neck: No neck pain  Cardiovascular: Negative for chest pain. Respiratory: + DOE. Gastrointestinal: Negative for abdominal pain, vomiting. + melena. Genitourinary: Negative for dysuria. Musculoskeletal: Negative for back pain. + b/l leg swelling Skin: Negative for rash. Neurological: Negative for headaches, weakness or numbness. Psych: No SI or HI  ____________________________________________   PHYSICAL EXAM:  VITAL SIGNS: ED Triage Vitals  Enc Vitals Group     BP  12/17/18 1804 132/67     Pulse Rate 12/17/18 1804 98     Resp 12/17/18 1804 20     Temp 12/17/18 1808 (!) 97.4 F (36.3 C)     Temp Source 12/17/18 1804 Oral     SpO2 12/17/18 1804 100 %     Weight 12/17/18 1806 155 lb (70.3 kg)     Height 12/17/18 1806 5\' 1"  (1.549 m)     Head Circumference --      Peak Flow --      Pain Score 12/17/18 1806 5     Pain Loc --      Pain Edu? --      Excl. in [place]? --     Constitutional: Alert and oriented. Well appearing and in no apparent distress. HEENT:      Head: Normocephalic  and atraumatic.         Eyes: Conjunctivae are normal. Sclera is non-icteric.       Mouth/Throat: Mucous membranes are moist.       Neck: Supple with no signs of meningismus. Cardiovascular: Regular rate and rhythm. No murmurs, gallops, or rubs. 2+ symmetrical distal pulses are present in all extremities. No JVD. Respiratory: Normal respiratory effort. Lungs are clear to auscultation bilaterally slightly diminished on the right. No wheezes, crackles, or rhonchi.  Gastrointestinal: Soft, non tender, and non distended with positive bowel sounds. No rebound or guarding. Genitourinary: No CVA tenderness.  Rectal exam showing no stool in the rectal vault Musculoskeletal: 1+ pitting edema bilaterally  neurologic: Normal speech and language. Face is symmetric. Moving all extremities. No gross focal neurologic deficits are appreciated. Skin: Skin is warm, dry and intact. No rash noted. Psychiatric: Mood and affect are normal. Speech and behavior are normal.  ____________________________________________   LABS (all labs ordered are listed, but only abnormal results are displayed)  Labs Reviewed  COMPREHENSIVE METABOLIC PANEL - Abnormal; Notable for the following components:      Result Value   Glucose, Bld 157 (*)    AST 45 (*)    All other components within normal limits  LIPASE, BLOOD - Abnormal; Notable for the following components:   Lipase 53 (*)    All other  components within normal limits  PROTIME-INR - Abnormal; Notable for the following components:   Prothrombin Time 21.4 (*)    All other components within normal limits  BRAIN NATRIURETIC PEPTIDE - Abnormal; Notable for the following components:   B Natriuretic Peptide 500.0 (*)    All other components within normal limits  CBC WITH DIFFERENTIAL/PLATELET - Abnormal; Notable for the following components:   WBC 3.5 (*)    RBC 3.29 (*)    Hemoglobin 8.4 (*)    HCT 28.2 (*)    MCH 25.5 (*)    MCHC 29.8 (*)    RDW 17.4 (*)    Lymphs Abs 0.4 (*)    All other components within normal limits  TROPONIN I  TYPE AND SCREEN   ____________________________________________  EKG  ED ECG REPORT I, Rudene Re, the attending physician, personally viewed and interpreted this ECG.  Accelerated junctional rhythm, rate of 98, normal QRS and QTC, normal axis, T wave inversions in 1 and aVL with slight depressions on inferior lateral leads, no ST elevation, low voltage QRS.  No prior for comparison ____________________________________________  RADIOLOGY  I have personally reviewed the images performed during this visit and I agree with the Radiologist's read.   Interpretation by Radiologist:  Dg Chest 2 View  Result Date: 12/17/2018 CLINICAL DATA:  Os chronic shortness of breath with exertion for the past few months. EXAM: CHEST - 2 VIEW COMPARISON:  Chest x-ray dated June 21, 2017. FINDINGS: Stable cardiomegaly. Mild bilateral interstitial thickening is unchanged. No focal consolidation, pleural effusion, or pneumothorax. No acute osseous abnormality. Prior median sternotomy. IMPRESSION: Stable cardiomegaly and mild interstitial edema. Electronically Signed   By: Titus Dubin M.D.   On: 12/17/2018 18:48   Ct Abdomen Pelvis W Contrast  Result Date: 12/17/2018 CLINICAL DATA:  63 year old female with abdominal pain. EXAM: CT ABDOMEN AND PELVIS WITH CONTRAST TECHNIQUE: Multidetector CT imaging  of the abdomen and pelvis was performed using the standard protocol following bolus administration of intravenous contrast. CONTRAST:  143mL OMNIPAQUE IOHEXOL 300 MG/ML  SOLN COMPARISON:  CT of the abdomen pelvis dated 11/24/2010 FINDINGS: Lower  chest: Partially visualized trace right pleural effusion. The visualized lung bases are otherwise clear. There is cardiomegaly. There is retrograde flow of contrast from the right atrium into the IVC in keeping with right heart dysfunction. No intra-abdominal free air or free fluid. Hepatobiliary: There is enlargement of the left lobe of the liver with slight surface irregularity in keeping with cirrhosis. There are multiple stones within the gallbladder. The gallbladder is predominantly contracted. There is diffuse thickened appearance of the gallbladder wall which may be chronic or related to underlying liver disease. Acute cholecystitis is not excluded. Ultrasound may provide better evaluation if there is high clinical concern for acute cholecystitis. Pancreas: Unremarkable. No pancreatic ductal dilatation or surrounding inflammatory changes. Spleen: Normal in size without focal abnormality. Adrenals/Urinary Tract: The adrenal glands are unremarkable. Subcentimeter bilateral renal hypodense lesions are too small to characterize. There is no hydronephrosis on either side. There is symmetric enhancement and excretion of contrast by both kidneys. The visualized ureters and urinary bladder appear unremarkable. Stomach/Bowel: Small scattered sigmoid diverticula without active inflammatory changes. There is moderate amount of stool throughout the colon. There is no bowel obstruction or active inflammation. Normal appendix. Vascular/Lymphatic: Mild aortoiliac atherosclerotic disease. No portal venous gas. There is no adenopathy. Reproductive: The uterus and ovaries are grossly unremarkable. No pelvic mass. Other: Small fat containing umbilical hernia. No fluid collection. Mild  diffuse subcutaneous edema. Musculoskeletal: Scoliosis and degenerative changes of the spine. No acute osseous pathology. IMPRESSION: 1. Morphologic changes of cirrhosis likely secondary to heart failure and passive congestion. 2. Cholelithiasis. Diffuse thickened appearance of the gallbladder wall may be chronic or related to underlying liver disease. Acute cholecystitis is not excluded. Ultrasound may provide better evaluation if there is high clinical concern for acute cholecystitis. 3. Moderate colonic stool burden. No bowel obstruction or active inflammation. Normal appendix. Electronically Signed   By: Anner Crete M.D.   On: 12/17/2018 22:00     ____________________________________________   PROCEDURES  Procedure(s) performed: None Procedures Critical Care performed:  Yes  CRITICAL CARE Performed by: Rudene Re  ?  Total critical care time: 30 min  Critical care time was exclusive of separately billable procedures and treating other patients.  Critical care was necessary to treat or prevent imminent or life-threatening deterioration.  Critical care was time spent personally by me on the following activities: development of treatment plan with patient and/or surrogate as well as nursing, discussions with consultants, evaluation of patient's response to treatment, examination of patient, obtaining history from patient or surrogate, ordering and performing treatments and interventions, ordering and review of laboratory studies, ordering and review of radiographic studies, pulse oximetry and re-evaluation of patient's condition.  ____________________________________________   INITIAL IMPRESSION / ASSESSMENT AND PLAN / ED COURSE   63 y.o. female with a history of A. fib on Coumadin, CHF with EF of 35%, CAD, diabetes, anemia, hypertension, hyperlipidemia who presents at the request of her primary care doctor for worsening DOE, anemia, melena on coumadin.  Patient is  hemodynamically stable.  Rectal exam showing no stool in the rectal vault.  She is complaining of several months of constant abdominal pain and now with melena making me concern for possible colon cancer.  She has no family history of such.  She reports a prior colonoscopy several years ago however I am unable to see the report.  She could also have just mild gastritis/peptic ulcer disease causing her to have melanotic stools.  Her hemoglobin here is 8.4 and it was 11.6 when  checked in May 2019.  Will start patient on Protonix.  Her INR is subtherapeutic at 1.88.  Will hold Coumadin. Will get CT to eval for large malignancy.  Patient also looks volume overloaded with 1+ pitting edema bilateral lower extremities and chest x-ray concerning for cardiomegaly mild interstitial edema.  Her dyspnea exertion could be due to both her anemia and mild CHF exacerbation.  We will diurese patient intravenously with Lasix.  Plan to admit to the hospitalist service.    _________________________ 10:05 PM on 12/17/2018 ----------------------------------------- BNP slightly elevated at 500 consistent with CHF exacerbation.  Patient was given 80 mg of IV Lasix.  CT abdomen pelvis showing evidence of cirrhosis but no other acute findings.  Patient's abdomen remains benign on exam with no clinical signs of cholecystitis. Discussed with Dr. Brett Albino for admission,    As part of my medical decision making, I reviewed the following data within the [place] notes reviewed and incorporated, Labs reviewed , EKG interpreted , Old EKG reviewed, Old chart reviewed, Radiograph reviewed , Discussed with admitting physician , Notes from prior ED visits and [place] Controlled Substance Database    Pertinent labs & imaging results that were available during my care of the patient were reviewed by me and considered in my medical decision making (see chart for  details).    ____________________________________________   FINAL CLINICAL IMPRESSION(S) / ED DIAGNOSES  Final diagnoses:  UGIB (upper gastrointestinal bleed)  Epigastric pain  Acute on chronic congestive heart failure, unspecified heart failure type ([place])  Accelerated junctional rhythm      NEW MEDICATIONS STARTED DURING THIS VISIT:  ED Discharge Orders    None       Note:  This document was prepared using Dragon voice recognition software and may include unintentional dictation errors.    Rudene Re, MD 12/17/18 [place], [place], MD 01/04/19 [id number]

## 2018-12-17 NOTE — ED Notes (Signed)
Patient transported to CT 

## 2018-12-17 NOTE — ED Triage Notes (Signed)
First Nurse Note:  Arrives for ED evaluation of anemia.  States HGB level dropped for 11 to 8 in 6 months.  Patient also c/o dyspnea on exertion--dyspnea resolves with rest.

## 2018-12-17 NOTE — ED Notes (Signed)
Pt currently in imaging.

## 2018-12-17 NOTE — H&P (Signed)
[place] [facility] [place] NAME: Tammy Arias    MR#:  [id number]  DATE OF BIRTH:  05/13/1956  DATE OF ADMISSION:  12/17/2018  PRIMARY CARE PHYSICIAN: Maryland Pink, MD   REQUESTING/REFERRING PHYSICIAN: Rudene Re, MD  CHIEF COMPLAINT:   Chief Complaint  Patient presents with  . Shortness of Breath    HISTORY OF PRESENT ILLNESS:  Tammy Arias  is a 63 y.o. female with a known history of paroxysmal atrial fibrillation (on Coumadin), chronic combined systolic and diastolic heart failure, hypertension, hyperlipidemia, type 2 diabetes, CAD hypothyroidism, history TIA who presented to the ED with shortness of breath, lower extremity edema, and epigastric abdominal pain.  She states that the shortness of breath and lower extremity edema have been going on for the last couple of months and have been progressively getting worse.  She endorses dyspnea on exertion.  She denies orthopnea.  She was seen by her heart doctor recently and her Lasix was increased from 40 mg daily to 40 mg twice daily.  The heart doctor also added on spironolactone.  She denies any chest pain.  She also endorses epigastric abdominal pain and has been having melena for the last 3 days.  She denies any NSAID use.  She states that she eats a lot of spicy foods.  No alcohol or tobacco use.  In the ED, BP was mildly low at 103/66.  Labs were significant for hemoglobin 8.4 (previously 11), BNP 500.  Chest x-ray with mild interstitial edema.  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  Hospitalists were called for admission.  PAST MEDICAL HISTORY:   Past Medical History:  Diagnosis Date  . Anemia   . Arthritis   . Atrial fibrillation ([place])   . Cardiomyopathy ([place])   . CHF (congestive heart failure) ([place])   . Coronary artery disease   . Diabetes mellitus without complication ([place])     . Dyspnea    with exertion  . Dysrhythmia   . GERD (gastroesophageal reflux disease)   . Headache    migraines  . Heart murmur   . History of shingles 2004  . Hyperlipidemia   . Hypertension   . Hypothyroidism   . IBS (irritable bowel syndrome)   . TIA (transient ischemic attack)   . Vitamin B 12 deficiency     PAST SURGICAL HISTORY:   Past Surgical History:  Procedure Laterality Date  . CARDIAC CATHETERIZATION    . CORONARY ARTERY BYPASS GRAFT    . EYE SURGERY Bilateral    Cataract Extraction with IOL  . RIGHT/LEFT HEART CATH AND CORONARY ANGIOGRAPHY Bilateral 03/29/2017   Procedure: Right/Left Heart Cath and Coronary Angiography;  Surgeon: Teodoro Spray, MD;  Location: [facility];  Service: Cardiovascular;  Laterality: Bilateral;  . TUBAL LIGATION    . VENTRAL HERNIA REPAIR N/A 04/21/2017   Procedure: HERNIA REPAIR VENTRAL ADULT;  Surgeon: Leonie Green, MD;  Location: [facility];  Service: General;  Laterality: N/A;    SOCIAL HISTORY:   Social History   Tobacco Use  . Smoking status: Former Smoker    Types: Cigarettes    Last attempt to quit: 03/30/1999    Years since quitting: 19.7  . Smokeless tobacco: Never Used  Substance Use Topics  . Alcohol use: No    FAMILY HISTORY:   Family History  Problem Relation Age of Onset  . Cancer  Sister        lung  . Cancer Sister        cervical  . Valvular heart disease Mother   . Diabetes Father   . Breast cancer Neg Hx     DRUG ALLERGIES:   Allergies  Allergen Reactions  . Vioxx [Rofecoxib] Swelling    REVIEW OF SYSTEMS:   Review of Systems  Constitutional: Negative for chills and fever.  HENT: Negative for congestion and sore throat.   Eyes: Negative for blurred vision and double vision.  Respiratory: Positive for shortness of breath. Negative for cough.   Cardiovascular: Positive for leg swelling. Negative for chest pain, palpitations and orthopnea.  Gastrointestinal: Positive for  abdominal pain and melena. Negative for blood in stool, constipation, diarrhea, nausea and vomiting.  Genitourinary: Negative for dysuria, hematuria and urgency.  Musculoskeletal: Negative for back pain and neck pain.  Neurological: Negative for dizziness and headaches.  Psychiatric/Behavioral: Negative for depression. The patient is not nervous/anxious.     MEDICATIONS AT HOME:   Prior to Admission medications   Medication Sig Start Date End Date Taking? Authorizing Provider  aspirin EC 81 MG tablet Take 81 mg by mouth daily.   Yes [provider]  carvedilol (COREG) 3.125 MG tablet Take 3.125 mg by mouth 2 (two) times daily with a meal.   Yes [provider]  Cyanocobalamin (VITAMIN B 12 PO) Take 1 tablet by mouth daily.   Yes [provider]  furosemide (LASIX) 40 MG tablet Take 80 mg by mouth daily.    Yes [provider]  latanoprost (XALATAN) 0.005 % ophthalmic solution Place 1 drop into both eyes at bedtime.   Yes [provider]  levothyroxine (SYNTHROID, LEVOTHROID) 75 MCG tablet Take 75 mcg by mouth daily before breakfast.   Yes [provider]  lisinopril (PRINIVIL,ZESTRIL) 5 MG tablet Take 5 mg by mouth daily.   Yes [provider]  lovastatin (MEVACOR) 10 MG tablet Take 10 mg by mouth at bedtime.   Yes [provider]  metFORMIN (GLUCOPHAGE) 1000 MG tablet Take 1,000 mg by mouth 2 (two) times daily with a meal.   Yes [provider]  spironolactone (ALDACTONE) 25 MG tablet Take 25 mg by mouth daily.   Yes [provider]  warfarin (COUMADIN) 3 MG tablet Take 1.5-3 mg by mouth at bedtime.    Yes [provider]  albuterol (PROVENTIL HFA;VENTOLIN HFA) 108 (90 Base) MCG/ACT inhaler Inhale 2 puffs into the lungs every 6 (six) hours as needed for wheezing or shortness of breath.    [provider]  ALPRAZolam Duanne Moron) 1 MG tablet Take 1 mg by mouth daily as needed for anxiety.     [provider]  HYDROcodone-acetaminophen (NORCO) 5-325 MG tablet Take 1-2 tablets by mouth every 4 (four) hours as needed for moderate pain. Patient not taking: Reported on 12/17/2018 04/21/17   Leonie Green, MD      VITAL SIGNS:  Blood pressure 107/64, pulse 90, temperature (!) 97.4 F (36.3 C), temperature source Oral, resp. rate 20, height 5\' 1"  (1.549 m), weight 70.3 kg, SpO2 100 %.  PHYSICAL EXAMINATION:  Physical Exam  GENERAL:  63 y.o.-year-old patient lying in the bed with no acute distress.  EYES: Pupils equal, round, reactive to light and accommodation. No scleral icterus. Extraocular muscles intact.  HEENT: Head atraumatic, normocephalic. Oropharynx and nasopharynx clear.  Moist mucous membranes. NECK:  Supple, no jugular venous distention. No thyroid enlargement, no tenderness.  LUNGS: + Bibasilar crackles present.  No use of accessory muscles of respiration.  CARDIOVASCULAR: RRR, S1, S2 normal. No murmurs, rubs, or gallops.  ABDOMEN: + epigastric abdominal pain.  Soft, nondistended. Bowel sounds present. No organomegaly or mass.  Negative Murphy sign. EXTREMITIES: No cyanosis or clubbing. 1+ pitting edema to the mid-shin bilaterally. NEUROLOGIC: Cranial nerves II through XII are intact. Muscle strength 5/5 in all extremities. Sensation intact. Gait not checked.  PSYCHIATRIC: The patient is alert and oriented x 3.  SKIN: No obvious rash, lesion, or ulcer.   LABORATORY PANEL:   CBC Recent Labs  Lab 12/17/18 1851  WBC 3.5*  HGB 8.4*  HCT 28.2*  PLT 180   ------------------------------------------------------------------------------------------------------------------  Chemistries  Recent Labs  Lab 12/17/18 1854  NA 138  Tammy 4.3  CL 103  CO2 28  GLUCOSE 157*  BUN 20  CREATININE 0.94  CALCIUM 8.9  AST 45*  ALT 26  ALKPHOS 89  BILITOT 0.7    ------------------------------------------------------------------------------------------------------------------  Cardiac Enzymes Recent Labs  Lab 12/17/18 1854  TROPONINI <0.03   ------------------------------------------------------------------------------------------------------------------  RADIOLOGY:  Dg Chest 2 View  Result Date: 12/17/2018 CLINICAL DATA:  Os chronic shortness of breath with exertion for the past few months. EXAM: CHEST - 2 VIEW COMPARISON:  Chest x-ray dated June 21, 2017. FINDINGS: Stable cardiomegaly. Mild bilateral interstitial thickening is unchanged. No focal consolidation, pleural effusion, or pneumothorax. No acute osseous abnormality. Prior median sternotomy. IMPRESSION: Stable cardiomegaly and mild interstitial edema. Electronically Signed   By: Titus Dubin M.D.   On: 12/17/2018 18:48   Ct Abdomen Pelvis W Contrast  Result Date: 12/17/2018 CLINICAL DATA:  63 year old female with abdominal pain. EXAM: CT ABDOMEN AND PELVIS WITH CONTRAST TECHNIQUE: Multidetector CT imaging of the abdomen and pelvis was performed using the standard protocol following bolus administration of intravenous contrast. CONTRAST:  170mL OMNIPAQUE IOHEXOL 300 MG/ML  SOLN COMPARISON:  CT of the abdomen pelvis dated 11/24/2010 FINDINGS: Lower chest: Partially visualized trace right pleural effusion. The visualized lung bases are otherwise clear. There is cardiomegaly. There is retrograde flow of contrast from the right atrium into the IVC in keeping with right heart dysfunction. No intra-abdominal free air or free fluid. Hepatobiliary: There is enlargement of the left lobe of the liver with slight surface irregularity in keeping with cirrhosis. There are multiple stones within the gallbladder. The gallbladder is predominantly contracted. There is diffuse thickened appearance of the gallbladder wall which may be chronic or related to underlying liver disease. Acute cholecystitis is not  excluded. Ultrasound may provide better evaluation if there is high clinical concern for acute cholecystitis. Pancreas: Unremarkable. No pancreatic ductal dilatation or surrounding inflammatory changes. Spleen: Normal in size without focal abnormality. Adrenals/Urinary Tract: The adrenal glands are unremarkable. Subcentimeter bilateral renal hypodense lesions are too small to characterize. There is no hydronephrosis on either side. There is symmetric enhancement and excretion of contrast by both kidneys. The visualized ureters and urinary bladder appear unremarkable. Stomach/Bowel: Small scattered sigmoid diverticula without active inflammatory changes. There is moderate amount of stool throughout the colon. There is no bowel obstruction or active inflammation. Normal appendix. Vascular/Lymphatic: Mild aortoiliac atherosclerotic disease. No portal venous gas. There is no adenopathy. Reproductive: The uterus and ovaries are grossly unremarkable. No pelvic mass. Other: Small fat containing umbilical hernia. No fluid collection. Mild diffuse subcutaneous edema. Musculoskeletal: Scoliosis and degenerative changes of the spine. No acute osseous pathology. IMPRESSION: 1. Morphologic changes of cirrhosis likely secondary to heart failure and passive congestion. 2.  Cholelithiasis. Diffuse thickened appearance of the gallbladder wall may be chronic or related to underlying liver disease. Acute cholecystitis is not excluded. Ultrasound may provide better evaluation if there is high clinical concern for acute cholecystitis. 3. Moderate colonic stool burden. No bowel obstruction or active inflammation. Normal appendix. Electronically Signed   By: Anner Crete M.D.   On: 12/17/2018 22:00      IMPRESSION AND PLAN:   1. Epigastric abdominal pain/melena- likely gastritis vs PUD.  No NSAID use. Hgb dropped from 11.6 to 8.4. Start Protonix drip.  GI consult.  Hold coumadin and aspirin.  2. Mild acute exacerbation of  chronic combined systolic and diastolic congestive heart failure- most recent ECHO 10/25/2018 with EF 35% and grade 2 diastolic dysfunction.  Start Lasix 40 mg IV twice daily.  Continue coreg and spironolactone.  Cardiac monitoring.  Strict I's and O's and daily weights.  3.  New liver cirrhosis- seen on CT abdomen pelvis.  Felt to be secondary to heart failure and passive congestion.  AST and ALT mildly elevated. GI consult.  Consider stopping home metformin with hepatic impairment.  4. Paroxysmal atrial fibrillation- in normal sinus rhythm here.  Continue coreg.  Holding Coumadin due to GI bleed.  5. CAD- stable, no active chest pain.  Holding aspirin due to GI bleed.  6. Type 2 diabetes- Sensitive SSI.  Need to consider stopping metformin on discharge due to hepatic impairment.  7. Hypertension- BPs soft in the ED. Hold lisinopril.  Continue Coreg.  8. Hypothyroidism- continue synthroid  9. Hyperlipidemia- continue statin  All the records are reviewed and case discussed with ED provider. Management plans discussed with the patient, family and they are in agreement.  CODE STATUS: DNI  TOTAL TIME TAKING CARE OF THIS PATIENT: 45 minutes.    Berna Spare  M.D on 12/17/2018 at 10:10 PM  Between 7am to 6pm - Pager [phone]  After 6pm go to [email or web address] - Proofreader  [facility]  Office  [phone]  CC: Primary care physician; Maryland Pink, MD   Note: This dictation was prepared with Dragon dictation along with smaller phrase technology. Any transcriptional errors that result from this process are unintentional.

## 2018-12-18 ENCOUNTER — Encounter: Payer: Self-pay | Admitting: *Deleted

## 2018-12-18 ENCOUNTER — Other Ambulatory Visit: Payer: Self-pay

## 2018-12-18 DIAGNOSIS — D649 Anemia, unspecified: Secondary | ICD-10-CM

## 2018-12-18 DIAGNOSIS — K7460 Unspecified cirrhosis of liver: Secondary | ICD-10-CM

## 2018-12-18 LAB — COMPREHENSIVE METABOLIC PANEL
ALK PHOS: 92 U/L (ref 38–126)
ALT: 22 U/L (ref 0–44)
ANION GAP: 8 (ref 5–15)
AST: 37 U/L (ref 15–41)
Albumin: 3.9 g/dL (ref 3.5–5.0)
BUN: 18 mg/dL (ref 8–23)
CO2: 26 mmol/L (ref 22–32)
Calcium: 8.8 mg/dL — ABNORMAL LOW (ref 8.9–10.3)
Chloride: 102 mmol/L (ref 98–111)
Creatinine, Ser: 0.9 mg/dL (ref 0.44–1.00)
GFR calc Af Amer: >60 mL/min (ref >60)
GFR calc non Af Amer: >60 mL/min (ref >60)
Glucose, Bld: 107 mg/dL — ABNORMAL HIGH (ref 70–99)
Potassium: 3.5 mmol/L (ref 3.5–5.1)
Sodium: 136 mmol/L (ref 135–145)
Total Bilirubin: 1 mg/dL (ref 0.3–1.2)
Total Protein: 6.7 g/dL (ref 6.5–8.1)

## 2018-12-18 LAB — CBC
HCT: 26.5 % — ABNORMAL LOW (ref 36.0–46.0)
Hemoglobin: 7.9 g/dL — ABNORMAL LOW (ref 12.0–15.0)
MCH: 25.1 pg — ABNORMAL LOW (ref 26.0–34.0)
MCHC: 29.8 g/dL — AB (ref 30.0–36.0)
MCV: 84.1 fL (ref 80.0–100.0)
Platelets: 149 10*3/uL — ABNORMAL LOW (ref 150–400)
RBC: 3.15 MIL/uL — ABNORMAL LOW (ref 3.87–5.11)
RDW: 17.4 % — ABNORMAL HIGH (ref 11.5–15.5)
WBC: 3.4 10*3/uL — ABNORMAL LOW (ref 4.0–10.5)
nRBC: 0 % (ref 0.0–0.2)

## 2018-12-18 LAB — ABO/RH: ABO/RH(D): A NEG

## 2018-12-18 LAB — PROTIME-INR
INR: 1.78
Prothrombin Time: 20.5 seconds — ABNORMAL HIGH (ref 11.4–15.2)

## 2018-12-18 LAB — GLUCOSE, CAPILLARY
GLUCOSE-CAPILLARY: 109 mg/dL — AB (ref 70–99)
Glucose-Capillary: 134 mg/dL — ABNORMAL HIGH (ref 70–99)
Glucose-Capillary: 94 mg/dL (ref 70–99)
Glucose-Capillary: 96 mg/dL (ref 70–99)
Glucose-Capillary: 98 mg/dL (ref 70–99)

## 2018-12-18 MED ORDER — ONDANSETRON HCL 4 MG PO TABS: 4.0000 mg | ORAL_TABLET | Freq: Four times a day (QID) | ORAL | Status: DC | PRN

## 2018-12-18 MED ORDER — PRAVASTATIN SODIUM 20 MG PO TABS
10.0000 mg | ORAL_TABLET | Freq: Every day | ORAL | Status: DC
  Administered 2018-12-18 – 2018-12-19 (×2): 10 mg via ORAL
  Filled 2018-12-18 (×2): qty 1

## 2018-12-18 MED ORDER — BISACODYL 5 MG PO TBEC
10.0000 mg | DELAYED_RELEASE_TABLET | Freq: Once | ORAL | Status: AC
  Administered 2018-12-18: 10 mg via ORAL
  Filled 2018-12-18: qty 2

## 2018-12-18 MED ORDER — ONDANSETRON HCL 4 MG/2ML IJ SOLN: 4.0000 mg | Freq: Four times a day (QID) | INTRAMUSCULAR | Status: DC | PRN

## 2018-12-18 MED ORDER — POLYETHYLENE GLYCOL 3350 17 G PO PACK: 17.0000 g | PACK | Freq: Every day | ORAL | Status: DC | PRN

## 2018-12-18 MED ORDER — CARVEDILOL 3.125 MG PO TABS
3.1250 mg | ORAL_TABLET | Freq: Two times a day (BID) | ORAL | Status: DC
  Administered 2018-12-18 – 2018-12-19 (×4): 3.125 mg via ORAL
  Filled 2018-12-18 (×5): qty 1

## 2018-12-18 MED ORDER — ALBUTEROL SULFATE (2.5 MG/3ML) 0.083% IN NEBU: 3.0000 mL | INHALATION_SOLUTION | Freq: Four times a day (QID) | RESPIRATORY_TRACT | Status: DC | PRN

## 2018-12-18 MED ORDER — ACETAMINOPHEN 325 MG PO TABS
650.0000 mg | ORAL_TABLET | Freq: Once | ORAL | Status: AC
  Administered 2018-12-18: 650 mg via ORAL
  Filled 2018-12-18: qty 2

## 2018-12-18 MED ORDER — FUROSEMIDE 10 MG/ML IJ SOLN
40.0000 mg | Freq: Two times a day (BID) | INTRAMUSCULAR | Status: DC
  Administered 2018-12-18 (×2): 40 mg via INTRAVENOUS
  Filled 2018-12-18 (×3): qty 4

## 2018-12-18 MED ORDER — PEG 3350-KCL-NA BICARB-NACL 420 G PO SOLR
4000.0000 mL | Freq: Once | ORAL | Status: AC
  Administered 2018-12-18: 4000 mL via ORAL
  Filled 2018-12-18: qty 4000

## 2018-12-18 MED ORDER — LATANOPROST 0.005 % OP SOLN
1.0000 [drp] | Freq: Every day | OPHTHALMIC | Status: DC
  Administered 2018-12-18 – 2018-12-19 (×3): 1 [drp] via OPHTHALMIC
  Filled 2018-12-18: qty 2.5

## 2018-12-18 MED ORDER — INSULIN ASPART 100 UNIT/ML ~~LOC~~ SOLN
0.0000 [IU] | Freq: Three times a day (TID) | SUBCUTANEOUS | Status: DC
  Administered 2018-12-19: 1 [IU] via SUBCUTANEOUS
  Filled 2018-12-18: qty 1

## 2018-12-18 MED ORDER — LEVOTHYROXINE SODIUM 50 MCG PO TABS
75.0000 ug | ORAL_TABLET | Freq: Every day | ORAL | Status: DC
  Administered 2018-12-18 – 2018-12-20 (×3): 75 ug via ORAL
  Filled 2018-12-18 (×3): qty 1

## 2018-12-18 MED ORDER — SPIRONOLACTONE 25 MG PO TABS
25.0000 mg | ORAL_TABLET | Freq: Every day | ORAL | Status: DC
  Administered 2018-12-18 – 2018-12-20 (×2): 25 mg via ORAL
  Filled 2018-12-18 (×3): qty 1

## 2018-12-18 MED ORDER — INSULIN ASPART 100 UNIT/ML ~~LOC~~ SOLN: 0.0000 [IU] | Freq: Every day | SUBCUTANEOUS | Status: DC

## 2018-12-18 MED ORDER — SODIUM CHLORIDE 0.9 % IV SOLN
INTRAVENOUS | Status: DC
  Administered 2018-12-19: 09:00:00 via INTRAVENOUS

## 2018-12-18 NOTE — Progress Notes (Signed)
Pts son, Jenny Lai  called requesting a work note be sent to his job stating that his mother has been admitted to hospital. Pt , Teya Otterson reviewed work note and gave permission for nurse to send note to [place] job.

## 2018-12-18 NOTE — Progress Notes (Signed)
[place] at [place] NAME: Tammy Arias    MR#:  [id number]  DATE OF BIRTH:  [birth year]  SUBJECTIVE: Seen at bedside, admitted for melena, patient told me that she is having melena because of gastric abdominal pain, shortness of breath.  Dark stool at home for 3 days, epigastric abdominal pain.  Patient says she cannot take ibuprofen or Advil.  CHIEF COMPLAINT:   Chief Complaint  Patient presents with  . Shortness of Breath    REVIEW OF SYSTEMS:   ROS CONSTITUTIONAL: No fever, fatigue or weakness.  EYES: No blurred or double vision.  EARS, NOSE, AND THROAT: No tinnitus or ear pain.  RESPIRATORY: short of breath no further melena. CARDIOVASCULAR: No chest pain, orthopnea, edema.  GASTROINTESTINAL: No nausea, vomiting, diarrhea or abdominal pain.  GENITOURINARY: No dysuria, hematuria.  ENDOCRINE: No polyuria, nocturia,  HEMATOLOGY: No anemia, easy bruising or bleeding SKIN: No rash or lesion. MUSCULOSKELETAL: No joint pain or arthritis.   NEUROLOGIC: No tingling, numbness, weakness.  PSYCHIATRY: No anxiety or depression.   DRUG ALLERGIES:   Allergies  Allergen Reactions  . Vioxx [Rofecoxib] Swelling    VITALS:  Blood pressure 104/62, pulse 94, temperature 98.2 F (36.8 C), temperature source Oral, resp. rate 18, height 5\' 1"  (1.549 m), weight 67.2 kg, SpO2 99 %.  PHYSICAL EXAMINATION:  GENERAL:  62 y.o.-year-old patient lying in the bed with no acute distress.  EYES: Pupils equal, round, reactive to light and accommodation. No scleral icterus. Extraocular muscles intact.  HEENT: Head atraumatic, normocephalic. Oropharynx and nasopharynx clear.  NECK:  Supple, no jugular venous distention. No thyroid enlargement, no tenderness.  LUNGS: Normal breath sounds bilaterally, no wheezing, rales,rhonchi or crepitation. No use of accessory muscles of respiration.  CARDIOVASCULAR: S1, S2 normal. No murmurs, rubs, or gallops.   ABDOMEN: Soft, nontender, nondistended. Bowel sounds present. No organomegaly or mass.  EXTREMITIES: No pedal edema, cyanosis, or clubbing.  NEUROLOGIC: Cranial nerves II through XII are intact. Muscle strength 5/5 in all extremities. Sensation intact. Gait not checked.  PSYCHIATRIC: The patient is alert and oriented x 3.  SKIN: No obvious rash, lesion, or ulcer.    LABORATORY PANEL:   CBC Recent Labs  Lab 12/18/18 0455  WBC 3.4*  HGB 7.9*  HCT 26.5*  PLT 149*   ------------------------------------------------------------------------------------------------------------------  Chemistries  Recent Labs  Lab 12/18/18 0455  NA 136  K 3.5  CL 102  CO2 26  GLUCOSE 107*  BUN 18  CREATININE 0.90  CALCIUM 8.8*  AST 37  ALT 22  ALKPHOS 92  BILITOT 1.0   ------------------------------------------------------------------------------------------------------------------  Cardiac Enzymes Recent Labs  Lab 12/17/18 1854  TROPONINI <0.03   ------------------------------------------------------------------------------------------------------------------  RADIOLOGY:  Dg Chest 2 View  Result Date: 12/17/2018 CLINICAL DATA:  Os chronic shortness of breath with exertion for the past few months. EXAM: CHEST - 2 VIEW COMPARISON:  Chest x-ray dated June 21, 2017. FINDINGS: Stable cardiomegaly. Mild bilateral interstitial thickening is unchanged. No focal consolidation, pleural effusion, or pneumothorax. No acute osseous abnormality. Prior median sternotomy. IMPRESSION: Stable cardiomegaly and mild interstitial edema. Electronically Signed   By: Titus Dubin M.D.   On: 12/17/2018 18:48   Ct Abdomen Pelvis W Contrast  Result Date: 12/17/2018 CLINICAL DATA:  63 year old female with abdominal pain. EXAM: CT ABDOMEN AND PELVIS WITH CONTRAST TECHNIQUE: Multidetector CT imaging of the abdomen and pelvis was performed using the standard protocol following bolus administration of intravenous  contrast. CONTRAST:  147mL OMNIPAQUE  IOHEXOL 300 MG/ML  SOLN COMPARISON:  CT of the abdomen pelvis dated 11/24/2010 FINDINGS: Lower chest: Partially visualized trace right pleural effusion. The visualized lung bases are otherwise clear. There is cardiomegaly. There is retrograde flow of contrast from the right atrium into the IVC in keeping with right heart dysfunction. No intra-abdominal free air or free fluid. Hepatobiliary: There is enlargement of the left lobe of the liver with slight surface irregularity in keeping with cirrhosis. There are multiple stones within the gallbladder. The gallbladder is predominantly contracted. There is diffuse thickened appearance of the gallbladder wall which may be chronic or related to underlying liver disease. Acute cholecystitis is not excluded. Ultrasound may provide better evaluation if there is high clinical concern for acute cholecystitis. Pancreas: Unremarkable. No pancreatic ductal dilatation or surrounding inflammatory changes. Spleen: Normal in size without focal abnormality. Adrenals/Urinary Tract: The adrenal glands are unremarkable. Subcentimeter bilateral renal hypodense lesions are too small to characterize. There is no hydronephrosis on either side. There is symmetric enhancement and excretion of contrast by both kidneys. The visualized ureters and urinary bladder appear unremarkable. Stomach/Bowel: Small scattered sigmoid diverticula without active inflammatory changes. There is moderate amount of stool throughout the colon. There is no bowel obstruction or active inflammation. Normal appendix. Vascular/Lymphatic: Mild aortoiliac atherosclerotic disease. No portal venous gas. There is no adenopathy. Reproductive: The uterus and ovaries are grossly unremarkable. No pelvic mass. Other: Small fat containing umbilical hernia. No fluid collection. Mild diffuse subcutaneous edema. Musculoskeletal: Scoliosis and degenerative changes of the spine. No acute osseous  pathology. IMPRESSION: 1. Morphologic changes of cirrhosis likely secondary to heart failure and passive congestion. 2. Cholelithiasis. Diffuse thickened appearance of the gallbladder wall may be chronic or related to underlying liver disease. Acute cholecystitis is not excluded. Ultrasound may provide better evaluation if there is high clinical concern for acute cholecystitis. 3. Moderate colonic stool burden. No bowel obstruction or active inflammation. Normal appendix. Electronically Signed   By: Anner Crete M.D.   On: 12/17/2018 22:00    EKG:   Orders placed or performed during the hospital encounter of 12/17/18  . ED EKG  . ED EKG  . EKG 12-Lead  . EKG 12-Lead    ASSESSMENT AND PLAN:   63 year old female with history of chronic A. fib on Coumadin comes in with melena, epigastric abdominal pain. Possible upper GI bleed with melena, patient needs EGD but INR should be below 1.5 for procedure so spoke with gastroenterology, hold Coumadin, recheck INR tomorrow, continue IV PPI twice daily, start clear liquids.  Monitor CBC closely.  And transfuse as needed.  Patient's hemoglobin was 8.4 yesterday and around 7.9 today.  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  Chronic systolic heart failure.   #3 ;proximal atrial fibrillation, patient is on Coreg, Coumadin, follows with Dr. Ubaldo Glassing, hold Coumadin secondary to melena.  INR is 1.78.  Check INR tomorrow. 4.  History of ischemic cardiomyopathy status history of cardiac bypass, #5 acute on chronic systolic heart failure, BNP 500, received 1 dose of 80 mg of Lasix in the emergency room, will continue Lasix 40 mg IV twice daily.  aspirin, continue Coreg, Lasix. #5 diabetes mellitus type 2, hold metformin, continue sliding scale insulin with coverage. All the records are reviewed and case discussed with Care Management/Social Workerr. Management plans discussed with the patient, family and they are in agreement.  CODE STATUS:  Full code  TOTAL TIME TAKING CARE OF THIS PATIENT: 40 minutes.   POSSIBLE D/C IN 1-2  DAYS, DEPENDING ON CLINICAL CONDITION.  More than 50% time spent in counseling, coordination of care Epifanio Lesches M.D on 12/18/2018 at 12:11 PM  Between 7am to 6pm - Pager - [phone]  After 6pm go to [email or web address] - password [email or web address] [facility]  Office  [phone]  CC: Primary care physician; Maryland Pink, MD   Note: This dictation was prepared with Dragon dictation along with smaller phrase technology. Any transcriptional errors that result from this process are unintentional.

## 2018-12-18 NOTE — Plan of Care (Signed)
  Problem: Clinical Measurements: Goal: Respiratory complications will improve Outcome: Progressing Goal: Cardiovascular complication will be avoided Outcome: Progressing   Problem: Activity: Goal: Risk for activity intolerance will decrease Outcome: Progressing   Problem: Elimination: Goal: Will not experience complications related to urinary retention Outcome: Progressing   Problem: Safety: Goal: Ability to remain free from injury will improve Outcome: Progressing

## 2018-12-18 NOTE — Consult Note (Signed)
Tammy Antigua, MD [address], [place], [place], [place], [address] [address], [place], [place], [place], [address] Phone: [phone]  Fax: [phone]  Consultation  Referring Provider:     Dr. Brett Albino Primary Care Physician:  [place] Pink, MD Reason for Consultation:    Abdominal pain  Date of Admission:  12/17/2018 Date of Consultation:  12/18/2018         HPI:   Tammy Arias is a 63 y.o. female with history of A. fib, on Coumadin at home, presented with shortness of breath, lower extremity edema and abdominal pain.  Patient admitted with CHF exacerbation and GI being consulted for drop in hemoglobin to 8 point from from 11 previously.  Patient reports dark stool at home for 3 days, but specifically denies black stool and states the stool was dark brown in color..  Abdominal pain is dull, 5 or 10, nonradiating.  No emesis.  No hematochezia or hematemesis.  No weight loss.   No family history of colon cancer.  Denies ever having a colonoscopy, but had a barium enema as her colon cancer screening in 2007 which was normal.  Thinks she may have had a colonoscopy around the same time and thinks it was normal.  We do not have the procedure report from this.  CT on admission also reported cirrhosis.  Past Medical History:  Diagnosis Date  . Anemia   . Arthritis   . Atrial fibrillation ([place])   . Cardiomyopathy ([place])   . CHF (congestive heart failure) ([place])   . Coronary artery disease   . Diabetes mellitus without complication ([place])   . Dyspnea    with exertion  . Dysrhythmia   . GERD (gastroesophageal reflux disease)   . Headache    migraines  . Heart murmur   . History of shingles 2004  . Hyperlipidemia   . Hypertension   . Hypothyroidism   . IBS (irritable bowel syndrome)   . TIA (transient ischemic attack)   . Vitamin B 12 deficiency     Past Surgical History:  Procedure Laterality Date  . CARDIAC CATHETERIZATION    . CORONARY ARTERY BYPASS GRAFT      . EYE SURGERY Bilateral    Cataract Extraction with IOL  . RIGHT/LEFT HEART CATH AND CORONARY ANGIOGRAPHY Bilateral 03/29/2017   Procedure: Right/Left Heart Cath and Coronary Angiography;  Surgeon: Teodoro Spray, MD;  Location: [facility];  Service: Cardiovascular;  Laterality: Bilateral;  . TUBAL LIGATION    . VENTRAL HERNIA REPAIR N/A 04/21/2017   Procedure: HERNIA REPAIR VENTRAL ADULT;  Surgeon: Leonie Green, MD;  Location: [facility];  Service: General;  Laterality: N/A;    Prior to Admission medications   Medication Sig Start Date End Date Taking? Authorizing Provider  aspirin EC 81 MG tablet Take 81 mg by mouth daily.   Yes [provider]  carvedilol (COREG) 3.125 MG tablet Take 3.125 mg by mouth 2 (two) times daily with a meal.   Yes [provider]  Cyanocobalamin (VITAMIN B 12 PO) Take 1 tablet by mouth daily.   Yes [provider]  furosemide (LASIX) 40 MG tablet Take 80 mg by mouth daily.    Yes [provider]  latanoprost (XALATAN) 0.005 % ophthalmic solution Place 1 drop into both eyes at bedtime.   Yes [provider]  levothyroxine (SYNTHROID, LEVOTHROID) 75 MCG tablet Take 75 mcg by mouth daily before breakfast.   Yes [provider]  lisinopril (PRINIVIL,ZESTRIL) 5 MG tablet Take 5 mg by mouth daily.   Yes [provider]  lovastatin (MEVACOR) 10 MG tablet Take 10 mg by mouth at bedtime.   Yes [provider]  metFORMIN (GLUCOPHAGE) 1000 MG tablet Take 1,000 mg by mouth 2 (two) times daily with a meal.   Yes [provider]  spironolactone (ALDACTONE) 25 MG tablet Take 25 mg by mouth daily.   Yes [provider]  warfarin (COUMADIN) 3 MG tablet Take 1.5-3 mg by mouth at bedtime.    Yes [provider]  albuterol (PROVENTIL HFA;VENTOLIN HFA) 108 (90 Base) MCG/ACT inhaler Inhale 2 puffs into the lungs every 6 (six) hours as needed for wheezing or shortness of  breath.    [provider]  ALPRAZolam Duanne Moron) 1 MG tablet Take 1 mg by mouth daily as needed for anxiety.    [provider]  HYDROcodone-acetaminophen (NORCO) 5-325 MG tablet Take 1-2 tablets by mouth every 4 (four) hours as needed for moderate pain. Patient not taking: Reported on 12/17/2018 04/21/17   Leonie Green, MD    Family History  Problem Relation Age of Onset  . Cancer Sister        lung  . Cancer Sister        cervical  . Valvular heart disease Mother   . Diabetes Father   . Breast cancer Neg Hx      Social History   Tobacco Use  . Smoking status: Former Smoker    Types: Cigarettes    Last attempt to quit: 03/30/1999    Years since quitting: 19.7  . Smokeless tobacco: Never Used  Substance Use Topics  . Alcohol use: No  . Drug use: No    Allergies as of 12/17/2018 - Review Complete 12/17/2018  Allergen Reaction Noted  . Vioxx [rofecoxib] Swelling 03/24/2017    Review of Systems:    All systems reviewed and negative except where noted in HPI.   Physical Exam:  Vital signs in last 24 hours: Vitals:   12/18/18 0038 12/18/18 0039 12/18/18 0415 12/18/18 0754  BP:  106/62 (!) 101/59 104/62  Pulse:  95 99 94  Resp:    18  Temp:  (!) 97.5 F (36.4 C) 98.4 F (36.9 C) 98.2 F (36.8 C)  TempSrc:  Oral Oral Oral  SpO2:  100% 96% 99%  Weight: 67.9 kg  67.2 kg   Height: 5\' 1"  (1.549 m)      Last BM Date: 12/17/18 General:   Pleasant, cooperative in NAD Head:  Normocephalic and atraumatic. Eyes:   No icterus.   Conjunctiva pink. PERRLA. Ears:  Normal auditory acuity. Neck:  Supple; no masses or thyroidomegaly Lungs: Respirations even and unlabored. Lungs clear to auscultation bilaterally.   No wheezes, crackles, or rhonchi.  Abdomen:  Soft, nondistended, nontender. Normal bowel sounds. No appreciable masses or hepatomegaly.  No rebound or guarding.  Neurologic:  Alert and oriented x3;  grossly normal neurologically. Skin:  Intact  without significant lesions or rashes. Cervical Nodes:  No significant cervical adenopathy. Psych:  Alert and cooperative. Normal affect.  LAB RESULTS: Recent Labs    12/17/18 1851 12/18/18 0455  WBC 3.5* 3.4*  HGB 8.4* 7.9*  HCT 28.2* 26.5*  PLT 180 149*   BMET Recent Labs    12/17/18 1854 12/18/18 0455  NA 138 136  K 4.3 3.5  CL 103 102  CO2 28 26  GLUCOSE 157* 107*  BUN 20 18  CREATININE  0.94 0.90  CALCIUM 8.9 8.8*   LFT Recent Labs    12/18/18 0455  PROT 6.7  ALBUMIN 3.9  AST 37  ALT 22  ALKPHOS 92  BILITOT 1.0   PT/INR Recent Labs    12/17/18 1851 12/18/18 0455  LABPROT 21.4* 20.5*  INR 1.88 1.78    STUDIES: Dg Chest 2 View  Result Date: 12/17/2018 CLINICAL DATA:  Os chronic shortness of breath with exertion for the past few months. EXAM: CHEST - 2 VIEW COMPARISON:  Chest x-ray dated June 21, 2017. FINDINGS: Stable cardiomegaly. Mild bilateral interstitial thickening is unchanged. No focal consolidation, pleural effusion, or pneumothorax. No acute osseous abnormality. Prior median sternotomy. IMPRESSION: Stable cardiomegaly and mild interstitial edema. Electronically Signed   By: Titus Dubin M.D.   On: 12/17/2018 18:48   Ct Abdomen Pelvis W Contrast  Result Date: 12/17/2018 CLINICAL DATA:  63 year old female with abdominal pain. EXAM: CT ABDOMEN AND PELVIS WITH CONTRAST TECHNIQUE: Multidetector CT imaging of the abdomen and pelvis was performed using the standard protocol following bolus administration of intravenous contrast. CONTRAST:  165mL OMNIPAQUE IOHEXOL 300 MG/ML  SOLN COMPARISON:  CT of the abdomen pelvis dated 11/24/2010 FINDINGS: Lower chest: Partially visualized trace right pleural effusion. The visualized lung bases are otherwise clear. There is cardiomegaly. There is retrograde flow of contrast from the right atrium into the IVC in keeping with right heart dysfunction. No intra-abdominal free air or free fluid. Hepatobiliary: There is  enlargement of the left lobe of the liver with slight surface irregularity in keeping with cirrhosis. There are multiple stones within the gallbladder. The gallbladder is predominantly contracted. There is diffuse thickened appearance of the gallbladder wall which may be chronic or related to underlying liver disease. Acute cholecystitis is not excluded. Ultrasound may provide better evaluation if there is high clinical concern for acute cholecystitis. Pancreas: Unremarkable. No pancreatic ductal dilatation or surrounding inflammatory changes. Spleen: Normal in size without focal abnormality. Adrenals/Urinary Tract: The adrenal glands are unremarkable. Subcentimeter bilateral renal hypodense lesions are too small to characterize. There is no hydronephrosis on either side. There is symmetric enhancement and excretion of contrast by both kidneys. The visualized ureters and urinary bladder appear unremarkable. Stomach/Bowel: Small scattered sigmoid diverticula without active inflammatory changes. There is moderate amount of stool throughout the colon. There is no bowel obstruction or active inflammation. Normal appendix. Vascular/Lymphatic: Mild aortoiliac atherosclerotic disease. No portal venous gas. There is no adenopathy. Reproductive: The uterus and ovaries are grossly unremarkable. No pelvic mass. Other: Small fat containing umbilical hernia. No fluid collection. Mild diffuse subcutaneous edema. Musculoskeletal: Scoliosis and degenerative changes of the spine. No acute osseous pathology. IMPRESSION: 1. Morphologic changes of cirrhosis likely secondary to heart failure and passive congestion. 2. Cholelithiasis. Diffuse thickened appearance of the gallbladder wall may be chronic or related to underlying liver disease. Acute cholecystitis is not excluded. Ultrasound may provide better evaluation if there is high clinical concern for acute cholecystitis. 3. Moderate colonic stool burden. No bowel obstruction or  active inflammation. Normal appendix. Electronically Signed   By: Anner Crete M.D.   On: 12/17/2018 22:00      Impression / Plan:   KYLINN SHROPSHIRE is a 63 y.o. y/o female with admission for CHF exacerbation with GI consult for abdominal pain, drop in hemoglobin in the setting of warfarin use  Patient will need further evaluation with EGD and her colonoscopy for anemia However, she was admitted with CHF exacerbation and would recommend optimization prior to endoscopic  procedures Her INR is also elevated to 1.7 today, please correct with goal of INR 1.5 prior to the procedures.  PPI IV twice daily  Continue serial CBCs and transfuse PRN Avoid NSAIDs Maintain 2 large-bore IV lines Please page GI with any acute hemodynamic changes, or signs of active GI bleeding  No prior history of GI bleeding.  Suspicion for variceal bleeding is low at this time given hemodynamic stability, no hematemesis, and no signs of active bleeding.    New finding of cirrhosis on CT scan, likely due to underlying CHF Would recommend medical optimization of CHF We will order further work-up to rule out any other causes of cirrhosis including viral hepatitis and other autoimmune hepatitis work-up. Liver enzymes are otherwise normal and platelets are normal low normal.  I have discussed alternative options, risks & benefits,  which include, but are not limited to, bleeding, infection, perforation,respiratory complication & drug reaction.  The patient agrees with this plan & written consent will be obtained.     Thank you for involving me in the care of this patient.      LOS: 1 day   Virgel Manifold, MD  12/18/2018, 9:54 AM

## 2018-12-18 NOTE — Progress Notes (Signed)
Family Meeting Note  Advance Directive:yes  Today a meeting took place with the Patient.  Patient is able to participate.  The following clinical team members were present during this meeting:MD  The following were discussed:Patient's diagnosis: upper GI bleed, CHF exacerbation, Patient's progosis: Unable to determine and Goals for treatment: DNI   Discussed CODE STATUS in detail with patient.  She states she has thought about this in the past.  She has been shocked 3 times before in her life.  She states that she would not mind undergoing cardiac resuscitation in the future if it were necessary.  She absolutely does not want to be intubated.  Will make patient DNI.  Additional follow-up to be provided: prn  Time spent during discussion:20 minutes  Evette Doffing, MD

## 2018-12-19 ENCOUNTER — Encounter
Admission: EM | Disposition: A | Discharge status: Home / Self Care | Payer: Self-pay | Source: Home / Self Care | Attending: Internal Medicine

## 2018-12-19 ENCOUNTER — Inpatient Hospital Stay: Payer: BLUE CROSS/BLUE SHIELD | Admitting: Anesthesiology

## 2018-12-19 ENCOUNTER — Encounter: Payer: Self-pay | Admitting: Anesthesiology

## 2018-12-19 DIAGNOSIS — D509 Iron deficiency anemia, unspecified: Secondary | ICD-10-CM

## 2018-12-19 DIAGNOSIS — K2270 Barrett's esophagus without dysplasia: Secondary | ICD-10-CM

## 2018-12-19 DIAGNOSIS — K5520 Angiodysplasia of colon without hemorrhage: Secondary | ICD-10-CM

## 2018-12-19 DIAGNOSIS — K31819 Angiodysplasia of stomach and duodenum without bleeding: Secondary | ICD-10-CM

## 2018-12-19 DIAGNOSIS — K228 Other specified diseases of esophagus: Secondary | ICD-10-CM

## 2018-12-19 DIAGNOSIS — K648 Other hemorrhoids: Secondary | ICD-10-CM

## 2018-12-19 HISTORY — PX: ESOPHAGOGASTRODUODENOSCOPY: SHX5428

## 2018-12-19 HISTORY — PX: COLONOSCOPY: SHX5424

## 2018-12-19 LAB — IRON AND TIBC
Iron: 23 ug/dL — ABNORMAL LOW (ref 28–170)
Saturation Ratios: 4 % — ABNORMAL LOW (ref 10.4–31.8)
TIBC: 527 ug/dL — ABNORMAL HIGH (ref 250–450)
UIBC: 504 ug/dL

## 2018-12-19 LAB — GLUCOSE, CAPILLARY
GLUCOSE-CAPILLARY: 107 mg/dL — AB (ref 70–99)
Glucose-Capillary: 112 mg/dL — ABNORMAL HIGH (ref 70–99)
Glucose-Capillary: 137 mg/dL — ABNORMAL HIGH (ref 70–99)
Glucose-Capillary: 101 mg/dL — ABNORMAL HIGH (ref 70–99)

## 2018-12-19 LAB — PROTIME-INR
INR: 1.89
Prothrombin Time: 21.5 seconds — ABNORMAL HIGH (ref 11.4–15.2)

## 2018-12-19 LAB — FERRITIN: Ferritin: 11 ng/mL (ref 11–307)

## 2018-12-19 LAB — BASIC METABOLIC PANEL
Anion gap: 8 (ref 5–15)
BUN: 11 mg/dL (ref 8–23)
CO2: 29 mmol/L (ref 22–32)
Calcium: 8.3 mg/dL — ABNORMAL LOW (ref 8.9–10.3)
Chloride: 101 mmol/L (ref 98–111)
Creatinine, Ser: 0.85 mg/dL (ref 0.44–1.00)
GFR calc Af Amer: >60 mL/min (ref >60)
GFR calc non Af Amer: >60 mL/min (ref >60)
Glucose, Bld: 99 mg/dL (ref 70–99)
Potassium: 3.1 mmol/L — ABNORMAL LOW (ref 3.5–5.1)
Sodium: 138 mmol/L (ref 135–145)

## 2018-12-19 LAB — CBC
HCT: 25.5 % — ABNORMAL LOW (ref 36.0–46.0)
HEMOGLOBIN: 7.7 g/dL — AB (ref 12.0–15.0)
MCH: 25.2 pg — ABNORMAL LOW (ref 26.0–34.0)
MCHC: 30.2 g/dL (ref 30.0–36.0)
MCV: 83.3 fL (ref 80.0–100.0)
Platelets: 140 10*3/uL — ABNORMAL LOW (ref 150–400)
RBC: 3.06 MIL/uL — ABNORMAL LOW (ref 3.87–5.11)
RDW: 17.3 % — ABNORMAL HIGH (ref 11.5–15.5)
WBC: 2.5 10*3/uL — ABNORMAL LOW (ref 4.0–10.5)
nRBC: 0 % (ref 0.0–0.2)

## 2018-12-19 LAB — HIV ANTIBODY (ROUTINE TESTING W REFLEX): HIV SCREEN 4TH GENERATION: NONREACTIVE

## 2018-12-19 SURGERY — EGD (ESOPHAGOGASTRODUODENOSCOPY)
Anesthesia: General

## 2018-12-19 MED ORDER — POTASSIUM CHLORIDE 10 MEQ/100ML IV SOLN
10.0000 meq | INTRAVENOUS | Status: AC
  Filled 2018-12-19 (×2): qty 100

## 2018-12-19 MED ORDER — LIDOCAINE HCL (CARDIAC) PF 100 MG/5ML IV SOSY
PREFILLED_SYRINGE | INTRAVENOUS | Status: DC | PRN
  Administered 2018-12-19: 100 mg via INTRAVENOUS

## 2018-12-19 MED ORDER — PHENYLEPHRINE HCL 10 MG/ML IJ SOLN
INTRAMUSCULAR | Status: DC | PRN
  Administered 2018-12-19: 200 ug via INTRAVENOUS

## 2018-12-19 MED ORDER — LISINOPRIL 5 MG PO TABS: 5.0000 mg | ORAL_TABLET | Freq: Every day | ORAL | Status: DC

## 2018-12-19 MED ORDER — PROPOFOL 10 MG/ML IV BOLUS
INTRAVENOUS | Status: DC | PRN
  Administered 2018-12-19: 70 mg via INTRAVENOUS

## 2018-12-19 MED ORDER — PROPOFOL 500 MG/50ML IV EMUL
INTRAVENOUS | Status: AC
  Filled 2018-12-19: qty 50

## 2018-12-19 MED ORDER — PANTOPRAZOLE SODIUM 40 MG PO TBEC: 40.0000 mg | DELAYED_RELEASE_TABLET | Freq: Every day | ORAL | Status: DC

## 2018-12-19 MED ORDER — GLYCOPYRROLATE 0.2 MG/ML IJ SOLN
INTRAMUSCULAR | Status: AC
  Filled 2018-12-19: qty 1

## 2018-12-19 MED ORDER — LIDOCAINE HCL (PF) 2 % IJ SOLN
INTRAMUSCULAR | Status: AC
  Filled 2018-12-19: qty 10

## 2018-12-19 MED ORDER — METFORMIN HCL 500 MG PO TABS
1000.0000 mg | ORAL_TABLET | Freq: Two times a day (BID) | ORAL | Status: DC
  Administered 2018-12-19 – 2018-12-20 (×2): 1000 mg via ORAL
  Filled 2018-12-19 (×2): qty 2

## 2018-12-19 MED ORDER — POTASSIUM CHLORIDE CRYS ER 20 MEQ PO TBCR
40.0000 meq | EXTENDED_RELEASE_TABLET | Freq: Once | ORAL | Status: AC
  Administered 2018-12-19: 40 meq via ORAL
  Filled 2018-12-19: qty 2

## 2018-12-19 MED ORDER — WARFARIN - PHARMACIST DOSING INPATIENT
Freq: Every day | Status: DC
  Administered 2018-12-19: 17:00:00

## 2018-12-19 MED ORDER — WARFARIN SODIUM 3 MG PO TABS
3.0000 mg | ORAL_TABLET | Freq: Once | ORAL | Status: AC
  Administered 2018-12-19: 3 mg via ORAL
  Filled 2018-12-19: qty 1

## 2018-12-19 MED ORDER — GLYCOPYRROLATE 0.2 MG/ML IJ SOLN
INTRAMUSCULAR | Status: DC | PRN
  Administered 2018-12-19: 0.2 mg via INTRAVENOUS

## 2018-12-19 MED ORDER — FUROSEMIDE 40 MG PO TABS
80.0000 mg | ORAL_TABLET | Freq: Every day | ORAL | Status: DC
  Administered 2018-12-19 – 2018-12-20 (×2): 80 mg via ORAL
  Filled 2018-12-19 (×2): qty 2

## 2018-12-19 MED ORDER — PROPOFOL 500 MG/50ML IV EMUL
INTRAVENOUS | Status: DC | PRN
  Administered 2018-12-19: 150 ug/kg/min via INTRAVENOUS

## 2018-12-19 NOTE — Op Note (Addendum)
[facility] Gastroenterology Patient Name: Tammy Arias Procedure Date: 12/19/2018 10:34 AM MRN: [id number] Account #: [id number] Date of Birth: 03-30-1956 Admit Type: Outpatient Age: 63 Room: [facility] ENDO ROOM 4 Gender: Female Note Status: Finalized Procedure:            Colonoscopy Indications:          Iron deficiency anemia Providers:            Natasha Burda B. Bonna Gains MD, MD Medicines:            Monitored Anesthesia Care Complications:        No immediate complications. Procedure:            Pre-Anesthesia Assessment:                       - Prior to the procedure, a History and Physical was                        performed, and patient medications, allergies and                        sensitivities were reviewed. The patient's tolerance of                        previous anesthesia was reviewed.                       - The risks and benefits of the procedure and the                        sedation options and risks were discussed with the                        patient. All questions were answered and informed                        consent was obtained.                       - Patient identification and proposed procedure were                        verified prior to the procedure by the physician, the                        nurse, the anesthesiologist, the anesthetist and the                        technician. The procedure was verified in the                        pre-procedure area in the procedure room in the                        endoscopy suite.                       - Prophylactic Antibiotics: The patient does not                        require prophylactic antibiotics.                       -  ASA Grade Assessment: II - A patient with mild                        systemic disease.                       - After reviewing the risks and benefits, the patient                        was deemed in satisfactory condition to undergo the   procedure.                       - Monitored anesthesia care was determined to be                        medically necessary for this procedure based on review                        of the patient's medical history, medications, and                        prior anesthesia history.                       - The anesthesia plan was to use monitored anesthesia                        care (MAC).                       After obtaining informed consent, the colonoscope was                        passed under direct vision. Throughout the procedure,                        the patient's blood pressure, pulse, and oxygen                        saturations were monitored continuously. The                        Colonoscope was introduced through the anus and                        advanced to the the cecum, identified by appendiceal                        orifice and ileocecal valve. The colonoscopy was                        performed with ease. The patient tolerated the                        procedure well. The quality of the bowel preparation                        was fair. Findings:      The perianal and digital rectal examinations were normal.      Seven medium-sized angioectasias without bleeding were found in the  descending colon, in the transverse colon and in the ascending colon.       Coagulation for bleeding prevention using argon plasma was successful.      The exam was otherwise without abnormality.      Non-bleeding internal hemorrhoids were found during retroflexion.      There is no endoscopic evidence of bleeding in the entire colon. Impression:           - Preparation of the colon was fair.                       - Seven non-bleeding colonic angioectasias. Treated                        with argon plasma coagulation (APC).                       - The examination was otherwise normal.                       - Non-bleeding internal hemorrhoids.                       - No  specimens collected. Recommendation:       - Patient had multiple AVMs seen in her stomach and                        colon today. These explain her anemia. She will benefit                        from iron replacement. Please check iron levels and                        replace with IV iron if indicated as an inpatient.                       - Ok to resume anticoagulation                       - Return to my office in 2 weeks.                       - Continue Serial CBCs and transfuse PRN                       - The findings and recommendations were discussed with                        the patient.                       - The findings and recommendations were discussed with                        the patient's family.                       - Return patient to hospital ward for ongoing care. Procedure Code(s):    --- Professional ---                       [phone], Colonoscopy, flexible; with control of bleeding,  any method Diagnosis Code(s):    --- Professional ---                       K64.8, Other hemorrhoids                       K55.20, Angiodysplasia of colon without hemorrhage                       D50.9, Iron deficiency anemia, unspecified CPT copyright 2018 American Medical Association. All rights reserved. The codes documented in this report are preliminary and upon coder review may  be revised to meet current compliance requirements.  Vonda Antigua, MD Margretta Sidle B. Bonna Gains MD, MD 12/19/2018 11:35:08 AM This report has been signed electronically. Number of Addenda: 0 Note Initiated On: 12/19/2018 10:34 AM Scope Withdrawal Time: 0 hours 13 minutes 30 seconds  Total Procedure Duration: 0 hours 19 minutes 0 seconds  Estimated Blood Loss: Estimated blood loss: none.      [facility]

## 2018-12-19 NOTE — Op Note (Signed)
[facility] Gastroenterology Patient Name: Tammy Arias Procedure Date: 12/19/2018 10:35 AM MRN: [id number] Account #: [id number] Date of Birth: 1956-06-26 Admit Type: Inpatient Age: 63 Room: [facility] ENDO ROOM 4 Gender: Female Note Status: Finalized Procedure:            Upper GI endoscopy Indications:          Iron deficiency anemia Providers:            Letta Cargile B. Bonna Gains MD, MD Medicines:            Monitored Anesthesia Care Complications:        No immediate complications. Procedure:            Pre-Anesthesia Assessment:                       - The risks and benefits of the procedure and the                        sedation options and risks were discussed with the                        patient. All questions were answered and informed                        consent was obtained.                       - Patient identification and proposed procedure were                        verified prior to the procedure.                       - ASA Grade Assessment: II - A patient with mild                        systemic disease.                       After obtaining informed consent, the endoscope was                        passed under direct vision. Throughout the procedure,                        the patient's blood pressure, pulse, and oxygen                        saturations were monitored continuously. The Endoscope                        was introduced through the mouth, and advanced to the                        second part of duodenum. The upper GI endoscopy was                        accomplished with ease. The patient tolerated the                        procedure well. Findings:      Scattered  islands of salmon-colored mucosa were present. No biopsies       done due to patient's anemia on this admission.      Five 3 to 5 mm no bleeding angioectasias were found in the gastric body.       Coagulation for tissue destruction using argon plasma was  successful.      A few, 2 to 4 mm non-bleeding erosions were found in the gastric fundus.       There were no stigmata of recent bleeding.      There is no endoscopic evidence of bleeding in the entire examined       stomach.      The examined duodenum was normal. Impression:           - Salmon-colored mucosa suspicious for short-segment                        Barrett's esophagus.                       - Five non-bleeding angioectasias in the stomach.                        Treated with argon plasma coagulation (APC).                       - Non-bleeding erosive gastropathy.                       - Normal examined duodenum.                       - No specimens collected. Recommendation:       - Use Protonix (pantoprazole) 40 mg PO daily for 4                        weeks.                       - Continue Serial CBCs and transfuse PRN                       - Check ferritin and iron studies, and replace iron if                        indicated today.                       - Return to my office in 2 weeks.                       - Repeat upper endoscopy at appointment to be scheduled                        for barretts biopsies.                       - The findings and recommendations were discussed with                        the patient.                       - The findings and recommendations were discussed with  the patient's family. Procedure Code(s):    --- Professional ---                       [phone], Esophagogastroduodenoscopy, flexible, transoral;                        with ablation of tumor(s), polyp(s), or other lesion(s)                        (includes pre- and post-dilation and guide wire                        passage, when performed) Diagnosis Code(s):    --- Professional ---                       K22.8, Other specified diseases of esophagus                       K31.819, Angiodysplasia of stomach and duodenum without                         bleeding                       D50.9, Iron deficiency anemia, unspecified CPT copyright 2018 American Medical Association. All rights reserved. The codes documented in this report are preliminary and upon coder review may  be revised to meet current compliance requirements.  Vonda Antigua, MD Margretta Sidle B. Bonna Gains MD, MD 12/19/2018 11:07:32 AM This report has been signed electronically. Number of Addenda: 0 Note Initiated On: 12/19/2018 10:35 AM Estimated Blood Loss: Estimated blood loss: none.      [facility]

## 2018-12-19 NOTE — Anesthesia Preprocedure Evaluation (Signed)
Anesthesia Evaluation  Patient identified by MRN, date of birth, ID band Patient awake    Reviewed: Allergy & Precautions, NPO status , Patient's Chart, lab work & pertinent test results  History of Anesthesia Complications Negative for: history of anesthetic complications  Airway Mallampati: II  TM Distance: >3 FB Neck ROM: Full    Dental  (+) Upper Dentures, Lower Dentures   Pulmonary shortness of breath, neg sleep apnea, neg COPD, neg recent URI, former smoker,    breath sounds clear to auscultation- rhonchi (-) wheezing      Cardiovascular hypertension, (-) angina+ CAD, + CABG (3v 2005) and +CHF  (-) Past MI and (-) Cardiac Stents + dysrhythmias Atrial Fibrillation Valvular problems/murmurs: 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 Patent svg to diagonal with flow to lad Insignificant disease in rca and lcx with no visible patent graft EF 30-35% with apical akinesis Moderate pulmonary hypertension   Neuro/Psych  Headaches, TIAnegative psych ROS   GI/Hepatic Neg liver ROS, GERD  ,  Endo/Other  diabetes, Oral Hypoglycemic AgentsHypothyroidism   Renal/GU negative Renal ROS     Musculoskeletal  (+) Arthritis ,   Abdominal (+) - obese,   Peds  Hematology  (+) Blood dyscrasia, anemia ,   Anesthesia Other Findings Past Medical History: No date: Anemia No date: Arthritis No date: Atrial fibrillation (HCC) No date: Cardiomyopathy ([place]) No date: CHF (congestive heart failure) (HCC) No date: Coronary artery disease No date: Diabetes mellitus without complication (HCC) No date: Dyspnea     Comment: with exertion No date: Dysrhythmia No date: GERD (gastroesophageal reflux disease) No date: Headache     Comment: migraines No date: Heart murmur 2004: History of shingles No date:  Hyperlipidemia No date: Hypertension No date: Hypothyroidism No date: IBS (irritable bowel syndrome) No date: TIA (transient ischemic attack) No date: Vitamin B 12 deficiency   Reproductive/Obstetrics                             Anesthesia Physical  Anesthesia Plan  ASA: III  Anesthesia Plan: General   Post-op Pain Management:    Induction: Intravenous  PONV Risk Score and Plan: 3 and Propofol infusion and TIVA  Airway Management Planned: Nasal Cannula and Natural Airway  Additional Equipment:   Intra-op Plan:   Post-operative Plan:   Informed Consent: I have reviewed the patients History and Physical, chart, labs and discussed the procedure including the risks, benefits and alternatives for the proposed anesthesia with the patient or authorized representative who has indicated his/her understanding and acceptance.     Dental advisory given  Plan Discussed with: CRNA and Anesthesiologist  Anesthesia Plan Comments:         Anesthesia Quick Evaluation

## 2018-12-19 NOTE — Transfer of Care (Signed)
Immediate Anesthesia Transfer of Care Note  Patient: Tammy Arias  Procedure(s) Performed: ESOPHAGOGASTRODUODENOSCOPY (EGD) (N/A ) COLONOSCOPY (N/A )  Patient Location: Endoscopy Unit  Anesthesia Type:General  Level of Consciousness: sedated  Airway & Oxygen Therapy: Patient Spontanous Breathing and Patient connected to nasal cannula oxygen  Post-op Assessment: Report given to RN and Post -op Vital signs reviewed and stable  Post vital signs: Reviewed and stable  Last Vitals:  Vitals Value Taken Time  BP 88/61 12/19/2018 11:32 AM  Temp 36.1 C 12/19/2018 11:31 AM  Pulse 91 12/19/2018 11:32 AM  Resp 9 12/19/2018 11:32 AM  SpO2 99 % 12/19/2018 11:32 AM  Vitals shown include unvalidated device data.  Last Pain:  Vitals:   12/19/18 1131  TempSrc: Tympanic  PainSc: Asleep         Complications: No apparent anesthesia complications

## 2018-12-19 NOTE — Progress Notes (Signed)
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  Status post argon laser.  GI recommends to check iron levels.  Patient is seen this morning before endoscopy, denies shortness of breath, stomach discomfort.  CHIEF COMPLAINT:   Chief Complaint  Patient presents with  . Shortness of Breath    REVIEW OF SYSTEMS:   ROS CONSTITUTIONAL: No fever, fatigue or weakness.  EYES: No blurred or double vision.  EARS, NOSE, AND THROAT: No tinnitus or ear pain.  RESPIRATORY: short of breath no further melena. CARDIOVASCULAR: No chest pain, orthopnea, edema.  GASTROINTESTINAL: No nausea, vomiting, diarrhea or abdominal pain.  GENITOURINARY: No dysuria, hematuria.  ENDOCRINE: No polyuria, nocturia,  HEMATOLOGY: No anemia, easy bruising or bleeding SKIN: No rash or lesion. MUSCULOSKELETAL: No joint pain or arthritis.   NEUROLOGIC: No tingling, numbness, weakness.  PSYCHIATRY: No anxiety or depression.   DRUG ALLERGIES:   Allergies  Allergen Reactions  . Vioxx [Rofecoxib] Swelling    VITALS:  Blood pressure (!) 88/61, pulse 91, temperature (!) 97 F (36.1 C), temperature source Tympanic, resp. rate (!) 9, height 5\' 1"  (1.549 m), weight 67.2 kg, SpO2 99 %.  PHYSICAL EXAMINATION:  GENERAL:  63 y.o.-year-old patient lying in the bed with no acute distress.  EYES: Pupils equal, round, reactive to light and accommodation. No scleral icterus. Extraocular muscles intact.  HEENT: Head atraumatic, normocephalic. Oropharynx and nasopharynx clear.  NECK:  Supple, no jugular venous distention. No thyroid enlargement, no tenderness.  LUNGS: Normal breath sounds bilaterally, no wheezing, rales,rhonchi or crepitation. No use of accessory muscles of respiration.  CARDIOVASCULAR: S1, S2 irregular.. No murmurs, rubs, or  gallops.  ABDOMEN: Soft, nontender, nondistended. Bowel sounds present. No organomegaly or mass.  EXTREMITIES: No pedal edema, cyanosis, or clubbing.  NEUROLOGIC: Cranial nerves II through XII are intact. Muscle strength 5/5 in all extremities. Sensation intact. Gait not checked.  PSYCHIATRIC: The patient is alert and oriented x 3.  SKIN: No obvious rash, lesion, or ulcer.    LABORATORY PANEL:   CBC Recent Labs  Lab 12/19/18 0425  WBC 2.5*  HGB 7.7*  HCT 25.5*  PLT 140*   ------------------------------------------------------------------------------------------------------------------  Chemistries  Recent Labs  Lab 12/18/18 0455 12/19/18 0425  NA 136 138  K 3.5 3.1*  CL 102 101  CO2 26 29  GLUCOSE 107* 99  BUN 18 11  CREATININE 0.90 0.85  CALCIUM 8.8* 8.3*  AST 37  --   ALT 22  --   ALKPHOS 92  --   BILITOT 1.0  --    ------------------------------------------------------------------------------------------------------------------  Cardiac Enzymes Recent Labs  Lab 12/17/18 1854  TROPONINI <0.03   ------------------------------------------------------------------------------------------------------------------  RADIOLOGY:  Dg Chest 2 View  Result Date: 12/17/2018 CLINICAL DATA:  Os chronic shortness of breath with exertion for the past few months. EXAM: CHEST - 2 VIEW COMPARISON:  Chest x-ray dated June 21, 2017. FINDINGS: Stable cardiomegaly. Mild bilateral interstitial thickening is unchanged. No focal consolidation, pleural effusion, or pneumothorax. No acute osseous abnormality. Prior median sternotomy. IMPRESSION: Stable cardiomegaly and mild interstitial edema. Electronically Signed   By: Titus Dubin M.D.   On: 12/17/2018 18:48   Ct Abdomen Pelvis W Contrast  Result Date: 12/17/2018 CLINICAL DATA:  63 year old female with abdominal pain. EXAM: CT ABDOMEN AND PELVIS WITH CONTRAST TECHNIQUE: Multidetector CT imaging  of the abdomen and pelvis was  performed using the standard protocol following bolus administration of intravenous contrast. CONTRAST:  187mL OMNIPAQUE IOHEXOL 300 MG/ML  SOLN COMPARISON:  CT of the abdomen pelvis dated 11/24/2010 FINDINGS: Lower chest: Partially visualized trace right pleural effusion. The visualized lung bases are otherwise clear. There is cardiomegaly. There is retrograde flow of contrast from the right atrium into the IVC in keeping with right heart dysfunction. No intra-abdominal free air or free fluid. Hepatobiliary: There is enlargement of the left lobe of the liver with slight surface irregularity in keeping with cirrhosis. There are multiple stones within the gallbladder. The gallbladder is predominantly contracted. There is diffuse thickened appearance of the gallbladder wall which may be chronic or related to underlying liver disease. Acute cholecystitis is not excluded. Ultrasound may provide better evaluation if there is high clinical concern for acute cholecystitis. Pancreas: Unremarkable. No pancreatic ductal dilatation or surrounding inflammatory changes. Spleen: Normal in size without focal abnormality. Adrenals/Urinary Tract: The adrenal glands are unremarkable. Subcentimeter bilateral renal hypodense lesions are too small to characterize. There is no hydronephrosis on either side. There is symmetric enhancement and excretion of contrast by both kidneys. The visualized ureters and urinary bladder appear unremarkable. Stomach/Bowel: Small scattered sigmoid diverticula without active inflammatory changes. There is moderate amount of stool throughout the colon. There is no bowel obstruction or active inflammation. Normal appendix. Vascular/Lymphatic: Mild aortoiliac atherosclerotic disease. No portal venous gas. There is no adenopathy. Reproductive: The uterus and ovaries are grossly unremarkable. No pelvic mass. Other: Small fat containing umbilical hernia. No fluid collection. Mild diffuse subcutaneous edema.  Musculoskeletal: Scoliosis and degenerative changes of the spine. No acute osseous pathology. IMPRESSION: 1. Morphologic changes of cirrhosis likely secondary to heart failure and passive congestion. 2. Cholelithiasis. Diffuse thickened appearance of the gallbladder wall may be chronic or related to underlying liver disease. Acute cholecystitis is not excluded. Ultrasound may provide better evaluation if there is high clinical concern for acute cholecystitis. 3. Moderate colonic stool burden. No bowel obstruction or active inflammation. Normal appendix. Electronically Signed   By: Anner Crete M.D.   On: 12/17/2018 22:00    EKG:   Orders placed or performed during the hospital encounter of 12/17/18  . ED EKG  . ED EKG  . EKG 12-Lead  . EKG 12-Lead    ASSESSMENT AND PLAN:   63 year old female with history of chronic A. fib on Coumadin comes in with melena, epigastric abdominal pain. 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  Check iron studies for evaluation of underlying anemia.    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  Chronic systolic heart failure.  Work-up for cirrhosis sent including hepatitis A, hep C, mitochondrial antibodies, ferritin, ceruloplasmin, anti-smooth muscle antibodies.   #3 ;proximal atrial fibrillation, patient is on Coreg, Coumadin, follows with Dr. Ubaldo Glassing, hold Coumadin secondary to melena.     4.  History of ischemic cardiomyopathy status history of cardiac bypass,   #5 acute on chronic systolic heart failure, BNP 500, switch to p.o. Lasix, replace potassium   #5. diabetes mellitus type 2, h restart metformin.  All the records are reviewed and case discussed with Care Management/Social Workerr. Management plans discussed with the patient, family and they are in  agreement.  CODE STATUS: Full code  TOTAL TIME TAKING CARE OF THIS PATIENT: 40 minutes.  POSSIBLE D/C IN 1-2 DAYS, DEPENDING ON CLINICAL CONDITION.  More than 50% time spent in counseling, coordination of care Epifanio Lesches M.D on 12/19/2018 at 12:39 PM  Between 7am to 6pm - Pager - [phone]  After 6pm go to [email or web address] - password [email or web address] [facility]  Office  [phone]  CC: Primary care physician; [place] Pink, MD   Note: This dictation was prepared with Dragon dictation along with smaller phrase technology. Any transcriptional errors that result from this process are unintentional.

## 2018-12-19 NOTE — OR Nursing (Signed)
1200- report to [place] re; AVMS  IN STOMACH AND COLON AND DISCHARGE INSTRUCTIONS. Understanding voiced

## 2018-12-19 NOTE — Progress Notes (Signed)
ANTICOAGULATION CONSULT NOTE - Initial Consult  Pharmacy Consult for Warfarin Indication: atrial fibrillation  Allergies  Allergen Reactions  . Vioxx [Rofecoxib] Swelling    Patient Measurements: Height: 5\' 1"  (154.9 cm) Weight: 148 lb 1.3 oz (67.2 kg) IBW/kg (Calculated) : 47.8  Vital Signs: Temp: 97 F (36.1 C) (01/22 1131) Temp Source: Tympanic (01/22 1131) BP: 97/68 (01/22 1240) Pulse Rate: 96 (01/22 1240)  Labs: Recent Labs    12/17/18 1851 12/17/18 1854 12/18/18 0455 12/19/18 0425  HGB 8.4*  --  7.9* 7.7*  HCT 28.2*  --  26.5* 25.5*  PLT 180  --  149* 140*  LABPROT 21.4*  --  20.5* 21.5*  INR 1.88  --  1.78 1.89  CREATININE  --  0.94 0.90 0.85  TROPONINI  --  <0.03  --   --     Estimated Creatinine Clearance: 59.5 mL/min (by C-G formula based on SCr of 0.85 mg/dL).   Medical History: Past Medical History:  Diagnosis Date  . Anemia   . Arthritis   . Atrial fibrillation ([place])   . Cardiomyopathy ([place])   . CHF (congestive heart failure) ([place])   . Coronary artery disease   . Diabetes mellitus without complication ([place])   . Dyspnea    with exertion  . Dysrhythmia   . GERD (gastroesophageal reflux disease)   . Headache    migraines  . Heart murmur   . History of shingles 2004  . Hyperlipidemia   . Hypertension   . Hypothyroidism   . IBS (irritable bowel syndrome)   . TIA (transient ischemic attack)   . Vitamin B 12 deficiency     Medications:  Medications Prior to Admission  Medication Sig Dispense Refill Last Dose  . aspirin EC 81 MG tablet Take 81 mg by mouth daily.   12/17/2018 at 0730  . carvedilol (COREG) 3.125 MG tablet Take 3.125 mg by mouth 2 (two) times daily with a meal.   12/17/2018 at 1720  . Cyanocobalamin (VITAMIN B 12 PO) Take 1 tablet by mouth daily.   12/17/2018 at 1720  . furosemide (LASIX) 40 MG tablet Take 80 mg by mouth daily.    12/17/2018 at 0730  . latanoprost (XALATAN) 0.005 % ophthalmic solution Place 1 drop into both eyes  at bedtime.   12/16/2018 at 2100  . levothyroxine (SYNTHROID, LEVOTHROID) 75 MCG tablet Take 75 mcg by mouth daily before breakfast.   12/17/2018 at 0730  . lisinopril (PRINIVIL,ZESTRIL) 5 MG tablet Take 5 mg by mouth daily.   12/17/2018 at 0730  . lovastatin (MEVACOR) 10 MG tablet Take 10 mg by mouth at bedtime.   12/17/2018 at 1720  . metFORMIN (GLUCOPHAGE) 1000 MG tablet Take 1,000 mg by mouth 2 (two) times daily with a meal.   12/17/2018 at 1720  . spironolactone (ALDACTONE) 25 MG tablet Take 25 mg by mouth daily.   12/17/2018 at 0730  . warfarin (COUMADIN) 3 MG tablet Take 1.5-3 mg by mouth at bedtime.    12/17/2018 at 1720  . albuterol (PROVENTIL HFA;VENTOLIN HFA) 108 (90 Base) MCG/ACT inhaler Inhale 2 puffs into the lungs every 6 (six) hours as needed for wheezing or shortness of breath.   prn at prn  . ALPRAZolam (XANAX) 1 MG tablet Take 1 mg by mouth daily as needed for anxiety.   prn at prn  . HYDROcodone-acetaminophen (NORCO) 5-325 MG tablet Take 1-2 tablets by mouth every 4 (four) hours as needed for moderate pain. (Patient not taking: Reported  on 12/17/2018) 12 tablet 0 Completed Course at Unknown time   Scheduled:  . carvedilol  3.125 mg Oral BID WC  . furosemide  80 mg Oral Daily  . insulin aspart  0-5 Units Subcutaneous QHS  . insulin aspart  0-9 Units Subcutaneous TID WC  . latanoprost  1 drop Both Eyes QHS  . levothyroxine  75 mcg Oral QAC breakfast  . lisinopril  5 mg Oral Daily  . metFORMIN  1,000 mg Oral BID WC  . [START ON 12/21/2018] pantoprazole  40 mg Intravenous Q12H  . pravastatin  10 mg Oral q1800  . spironolactone  25 mg Oral Daily   Infusions:   PRN: albuterol, ondansetron **OR** ondansetron (ZOFRAN) IV, polyethylene glycol  Assessment: Patient takes 1.5 mg on Monday and Friday, and 3 mg on Tuesday, Wednesday, Thursday and Saturday and Sunday. INR is elevated at 1.89. Do not use to bridge the patient for afib. INR may continue to trend down tomorrow, due to doses  being held. Do not recommend increasing the dose if that does occur. No major drug drug interaction.   Date INR Warfarin Dose  1/22 1.89 3 mg       Goal of Therapy:  INR 2-3 Monitor platelets by anticoagulation protocol: Yes   Plan:  Will start warfarin 3 mg tonight. Will order daily INR and CBC. Pharmacy will continue to monitor. If there are any questions about dosing do not hesitate to call pharmacy.   Oswald Hillock, PharmD, BCPS Clinical Pharmacist 12/19/2018,1:06 PM

## 2018-12-19 NOTE — Progress Notes (Signed)
Vonda Antigua, MD [address], [place], [place], [place], [address] [address], [place], [place], [place], [address] Phone: [phone]  Fax: [phone]   Subjective: Pt drank her prep and denies any melena or hematochezia with it. The patient denies abdominal or flank pain, anorexia, nausea or vomiting, dysphagia, change in bowel habits or black or bloody stools or weight loss.    Objective: Exam: Vital signs in last 24 hours: Vitals:   12/18/18 2125 12/19/18 0519 12/19/18 0721 12/19/18 0850  BP: 102/65 (!) 99/58 (!) 102/56 114/68  Pulse:  97 96 (!) 103  Resp: 18 20  16   Temp:  98.1 F (36.7 C) 98.3 F (36.8 C) (!) 96.4 F (35.8 C)  TempSrc:  Oral Oral Tympanic  SpO2:  99% 97% 100%  Weight:    67.2 kg  Height:    5\' 1"  (1.549 m)   Weight change:   Intake/Output Summary (Last 24 hours) at 12/19/2018 0913 Last data filed at 12/18/2018 2128 Gross per 24 hour  Intake -  Output 2900 ml  Net -2900 ml    General: No acute distress, AAO x3 Abd: Soft, NT/ND, No HSM Skin: Warm, no rashes Neck: Supple, Trachea midline   Lab Results: Lab Results  Component Value Date   WBC 2.5 (L) 12/19/2018   HGB 7.7 (L) 12/19/2018   HCT 25.5 (L) 12/19/2018   MCV 83.3 12/19/2018   PLT 140 (L) 12/19/2018   Micro Results: No results found for this or any previous visit (from the past 240 hour(s)). Studies/Results: Dg Chest 2 View  Result Date: 12/17/2018 CLINICAL DATA:  Os chronic shortness of breath with exertion for the past few months. EXAM: CHEST - 2 VIEW COMPARISON:  Chest x-ray dated June 21, 2017. FINDINGS: Stable cardiomegaly. Mild bilateral interstitial thickening is unchanged. No focal consolidation, pleural effusion, or pneumothorax. No acute osseous abnormality. Prior median sternotomy. IMPRESSION: Stable cardiomegaly and mild interstitial edema. Electronically Signed   By: Titus Dubin M.D.   On: 12/17/2018 18:48   Ct Abdomen Pelvis W Contrast  Result  Date: 12/17/2018 CLINICAL DATA:  63 year old female with abdominal pain. EXAM: CT ABDOMEN AND PELVIS WITH CONTRAST TECHNIQUE: Multidetector CT imaging of the abdomen and pelvis was performed using the standard protocol following bolus administration of intravenous contrast. CONTRAST:  137mL OMNIPAQUE IOHEXOL 300 MG/ML  SOLN COMPARISON:  CT of the abdomen pelvis dated 11/24/2010 FINDINGS: Lower chest: Partially visualized trace right pleural effusion. The visualized lung bases are otherwise clear. There is cardiomegaly. There is retrograde flow of contrast from the right atrium into the IVC in keeping with right heart dysfunction. No intra-abdominal free air or free fluid. Hepatobiliary: There is enlargement of the left lobe of the liver with slight surface irregularity in keeping with cirrhosis. There are multiple stones within the gallbladder. The gallbladder is predominantly contracted. There is diffuse thickened appearance of the gallbladder wall which may be chronic or related to underlying liver disease. Acute cholecystitis is not excluded. Ultrasound may provide better evaluation if there is high clinical concern for acute cholecystitis. Pancreas: Unremarkable. No pancreatic ductal dilatation or surrounding inflammatory changes. Spleen: Normal in size without focal abnormality. Adrenals/Urinary Tract: The adrenal glands are unremarkable. Subcentimeter bilateral renal hypodense lesions are too small to characterize. There is no hydronephrosis on either side. There is symmetric enhancement and excretion of contrast by both kidneys. The visualized ureters and urinary bladder appear unremarkable. Stomach/Bowel: Small scattered sigmoid diverticula without active inflammatory changes. There is moderate amount  of stool throughout the colon. There is no bowel obstruction or active inflammation. Normal appendix. Vascular/Lymphatic: Mild aortoiliac atherosclerotic disease. No portal venous gas. There is no adenopathy.  Reproductive: The uterus and ovaries are grossly unremarkable. No pelvic mass. Other: Small fat containing umbilical hernia. No fluid collection. Mild diffuse subcutaneous edema. Musculoskeletal: Scoliosis and degenerative changes of the spine. No acute osseous pathology. IMPRESSION: 1. Morphologic changes of cirrhosis likely secondary to heart failure and passive congestion. 2. Cholelithiasis. Diffuse thickened appearance of the gallbladder wall may be chronic or related to underlying liver disease. Acute cholecystitis is not excluded. Ultrasound may provide better evaluation if there is high clinical concern for acute cholecystitis. 3. Moderate colonic stool burden. No bowel obstruction or active inflammation. Normal appendix. Electronically Signed   By: Anner Crete M.D.   On: 12/17/2018 22:00   Medications:  Scheduled Meds: . [MAR Hold] carvedilol  3.125 mg Oral BID WC  . [MAR Hold] furosemide  40 mg Intravenous BID  . [MAR Hold] insulin aspart  0-5 Units Subcutaneous QHS  . [MAR Hold] insulin aspart  0-9 Units Subcutaneous TID WC  . [MAR Hold] latanoprost  1 drop Both Eyes QHS  . [MAR Hold] levothyroxine  75 mcg Oral QAC breakfast  . [MAR Hold] pantoprazole  40 mg Intravenous Q12H  . [MAR Hold] pravastatin  10 mg Oral q1800  . [MAR Hold] spironolactone  25 mg Oral Daily   Continuous Infusions: . sodium chloride 20 mL/hr at 12/19/18 0902  . pantoprozole (PROTONIX) infusion 8 mg/hr (12/19/18 0545)  . [MAR Hold] potassium chloride     PRN Meds:.[MAR Hold] albuterol, [MAR Hold] ondansetron **OR** [MAR Hold] ondansetron (ZOFRAN) IV, [MAR Hold] polyethylene glycol   Assessment:  Anemia  Plan: Pt completed prep Will proceed with EGD and colonoscopy as planned for evaluation of anemia  PPI IV twice daily  Continue serial CBCs and transfuse PRN Avoid NSAIDs Maintain 2 large-bore IV lines Please page GI with any acute hemodynamic changes, or signs of active GI bleeding  Viral and  autoimmune hepatitis workup ordered for cirrhosis Liver enzymes normal Likely source of cirrhosis is patient's CHF. Optimize CHF and consider cardiology consult  I have discussed alternative options, risks & benefits,  which include, but are not limited to, bleeding, infection, perforation,respiratory complication & drug reaction.  The patient agrees with this plan & written consent will be obtained.      LOS: 2 days   Vonda Antigua, MD 12/19/2018, 9:13 AM

## 2018-12-19 NOTE — Anesthesia Post-op Follow-up Note (Signed)
Anesthesia QCDR form completed.        

## 2018-12-19 NOTE — Progress Notes (Signed)
Spoke with gastroenterology, Dr. Bonna Gains, recommends to restart Coumadin, watch in the hospital for further evidence of any bleeding and possible discharge tomorrow.  Added metformin, change to home dose Lasix, lisinopril.

## 2018-12-20 ENCOUNTER — Encounter: Payer: Self-pay | Admitting: Gastroenterology

## 2018-12-20 LAB — GLUCOSE, CAPILLARY
Glucose-Capillary: 112 mg/dL — ABNORMAL HIGH (ref 70–99)
Glucose-Capillary: 117 mg/dL — ABNORMAL HIGH (ref 70–99)

## 2018-12-20 LAB — PROTIME-INR
INR: 2.02
Prothrombin Time: 22.6 seconds — ABNORMAL HIGH (ref 11.4–15.2)

## 2018-12-20 LAB — ANTI-SMOOTH MUSCLE ANTIBODY, IGG: F-ACTIN AB IGG: 11 U (ref 0–19)

## 2018-12-20 LAB — CBC
HCT: 25.9 % — ABNORMAL LOW (ref 36.0–46.0)
Hemoglobin: 7.8 g/dL — ABNORMAL LOW (ref 12.0–15.0)
MCH: 25.3 pg — AB (ref 26.0–34.0)
MCHC: 30.1 g/dL (ref 30.0–36.0)
MCV: 84.1 fL (ref 80.0–100.0)
Platelets: 135 10*3/uL — ABNORMAL LOW (ref 150–400)
RBC: 3.08 MIL/uL — ABNORMAL LOW (ref 3.87–5.11)
RDW: 17.3 % — ABNORMAL HIGH (ref 11.5–15.5)
WBC: 4.1 10*3/uL (ref 4.0–10.5)
nRBC: 0 % (ref 0.0–0.2)

## 2018-12-20 LAB — CERULOPLASMIN: Ceruloplasmin: 34.5 mg/dL (ref 19.0–39.0)

## 2018-12-20 LAB — HEPATITIS B SURFACE ANTIGEN: Hepatitis B Surface Ag: NEGATIVE

## 2018-12-20 LAB — HEPATITIS B CORE ANTIBODY, TOTAL: Hep B Core Total Ab: NEGATIVE

## 2018-12-20 LAB — HCV COMMENT:

## 2018-12-20 LAB — HEPATITIS B SURFACE ANTIBODY, QUANTITATIVE: Hep B S AB Quant (Post): <3.1 m[IU]/mL — ABNORMAL LOW (ref >9.9)

## 2018-12-20 LAB — HEPATITIS C ANTIBODY (REFLEX): HCV Ab: <0.1 s/co ratio (ref 0.0–0.9)

## 2018-12-20 LAB — MITOCHONDRIAL ANTIBODIES: Mitochondrial M2 Ab, IgG: <20 Units (ref 0.0–20.0)

## 2018-12-20 LAB — HEPATITIS A ANTIBODY, TOTAL: Hep A Total Ab: NEGATIVE

## 2018-12-20 MED ORDER — FERROUS SULFATE 325 (65 FE) MG PO TABS: 325.0000 mg | ORAL_TABLET | Freq: Two times a day (BID) | ORAL | 1 refills | Status: DC

## 2018-12-20 MED ORDER — PANTOPRAZOLE SODIUM 40 MG PO TBEC: 40.0000 mg | DELAYED_RELEASE_TABLET | Freq: Every day | ORAL | 1 refills | Status: DC

## 2018-12-20 MED ORDER — SODIUM CHLORIDE 0.9% FLUSH
3.0000 mL | Freq: Two times a day (BID) | INTRAVENOUS | Status: DC
  Administered 2018-12-20: 3 mL via INTRAVENOUS

## 2018-12-20 MED ORDER — FERROUS SULFATE 325 (65 FE) MG PO TABS
325.0000 mg | ORAL_TABLET | Freq: Two times a day (BID) | ORAL | Status: DC
  Administered 2018-12-20: 325 mg via ORAL
  Filled 2018-12-20: qty 1

## 2018-12-20 MED ORDER — WARFARIN SODIUM 3 MG PO TABS
3.0000 mg | ORAL_TABLET | Freq: Once | ORAL | Status: DC
  Filled 2018-12-20: qty 1

## 2018-12-20 MED ORDER — ACETAMINOPHEN 325 MG PO TABS
650.0000 mg | ORAL_TABLET | Freq: Four times a day (QID) | ORAL | Status: DC | PRN
  Administered 2018-12-20: 650 mg via ORAL
  Filled 2018-12-20: qty 2

## 2018-12-20 NOTE — Progress Notes (Addendum)
ANTICOAGULATION CONSULT NOTE - Initial Consult  Pharmacy Consult for Warfarin Indication: atrial fibrillation  Allergies  Allergen Reactions  . Vioxx [Rofecoxib] Swelling    Patient Measurements: Height: 5\' 1"  (154.9 cm) Weight: 145 lb 6.4 oz (66 kg) IBW/kg (Calculated) : 47.8  Vital Signs: Temp: 98.1 F (36.7 C) (01/23 0738) Temp Source: Oral (01/23 0738) BP: 100/47 (01/23 0738) Pulse Rate: 66 (01/23 0738)  Labs: Recent Labs    12/17/18 1854 12/18/18 0455 12/19/18 0425 12/20/18 0308  HGB  --  7.9* 7.7* 7.8*  HCT  --  26.5* 25.5* 25.9*  PLT  --  149* 140* 135*  LABPROT  --  20.5* 21.5* 22.6*  INR  --  1.78 1.89 2.02  CREATININE 0.94 0.90 0.85  --   TROPONINI <0.03  --   --   --     Estimated Creatinine Clearance: 58.9 mL/min (by C-G formula based on SCr of 0.85 mg/dL).   Medical History: Past Medical History:  Diagnosis Date  . Anemia   . Arthritis   . Atrial fibrillation ([place])   . Cardiomyopathy ([place])   . CHF (congestive heart failure) ([place])   . Coronary artery disease   . Diabetes mellitus without complication ([place])   . Dyspnea    with exertion  . Dysrhythmia   . GERD (gastroesophageal reflux disease)   . Headache    migraines  . Heart murmur   . History of shingles 2004  . Hyperlipidemia   . Hypertension   . Hypothyroidism   . IBS (irritable bowel syndrome)   . TIA (transient ischemic attack)   . Vitamin B 12 deficiency     Medications:  Medications Prior to Admission  Medication Sig Dispense Refill Last Dose  . aspirin EC 81 MG tablet Take 81 mg by mouth daily.   12/17/2018 at 0730  . carvedilol (COREG) 3.125 MG tablet Take 3.125 mg by mouth 2 (two) times daily with a meal.   12/17/2018 at 1720  . Cyanocobalamin (VITAMIN B 12 PO) Take 1 tablet by mouth daily.   12/17/2018 at 1720  . furosemide (LASIX) 40 MG tablet Take 80 mg by mouth daily.    12/17/2018 at 0730  . latanoprost (XALATAN) 0.005 % ophthalmic solution Place 1 drop into both eyes  at bedtime.   12/16/2018 at 2100  . levothyroxine (SYNTHROID, LEVOTHROID) 75 MCG tablet Take 75 mcg by mouth daily before breakfast.   12/17/2018 at 0730  . lisinopril (PRINIVIL,ZESTRIL) 5 MG tablet Take 5 mg by mouth daily.   12/17/2018 at 0730  . lovastatin (MEVACOR) 10 MG tablet Take 10 mg by mouth at bedtime.   12/17/2018 at 1720  . metFORMIN (GLUCOPHAGE) 1000 MG tablet Take 1,000 mg by mouth 2 (two) times daily with a meal.   12/17/2018 at 1720  . spironolactone (ALDACTONE) 25 MG tablet Take 25 mg by mouth daily.   12/17/2018 at 0730  . warfarin (COUMADIN) 3 MG tablet Take 1.5-3 mg by mouth at bedtime.    12/17/2018 at 1720  . albuterol (PROVENTIL HFA;VENTOLIN HFA) 108 (90 Base) MCG/ACT inhaler Inhale 2 puffs into the lungs every 6 (six) hours as needed for wheezing or shortness of breath.   prn at prn  . ALPRAZolam (XANAX) 1 MG tablet Take 1 mg by mouth daily as needed for anxiety.   prn at prn  . HYDROcodone-acetaminophen (NORCO) 5-325 MG tablet Take 1-2 tablets by mouth every 4 (four) hours as needed for moderate pain. (Patient not taking: Reported  on 12/17/2018) 12 tablet 0 Completed Course at Unknown time   Scheduled:  . carvedilol  3.125 mg Oral BID WC  . ferrous sulfate  325 mg Oral BID WC  . furosemide  80 mg Oral Daily  . insulin aspart  0-5 Units Subcutaneous QHS  . insulin aspart  0-9 Units Subcutaneous TID WC  . latanoprost  1 drop Both Eyes QHS  . levothyroxine  75 mcg Oral QAC breakfast  . metFORMIN  1,000 mg Oral BID WC  . [START ON 12/21/2018] pantoprazole  40 mg Intravenous Q12H  . pravastatin  10 mg Oral q1800  . sodium chloride flush  3 mL Intravenous Q12H  . spironolactone  25 mg Oral Daily  . Warfarin - Pharmacist Dosing Inpatient   Does not apply q1800   Infusions:   PRN: acetaminophen, albuterol, ondansetron **OR** ondansetron (ZOFRAN) IV, polyethylene glycol  Assessment: Patient takes 1.5 mg on Monday and Friday, and 3 mg on Tuesday, Wednesday, Thursday and Saturday  and Sunday. INR is elevated at 1.89. Do not need to bridge for afib with INR elevated. INR may continue to trend down tomorrow, due to doses being held. Do not recommend increasing the dose if that does occur. No major drug drug interaction.   Date INR Warfarin Dose  1/22 1.89 3 mg  1/23 2.02 3 mg   Goal of Therapy:  INR 2-3 Monitor platelets by anticoagulation protocol: Yes   Plan:  Will continue warfarin 3 mg tonight. Will order daily INR and CBC. Pharmacy will continue to monitor. If there are any questions about dosing do not hesitate to call pharmacy.   Oswald Hillock, PharmD, BCPS Clinical Pharmacist 12/20/2018,10:30 AM

## 2018-12-20 NOTE — Discharge Summary (Signed)
INAAYA VELLUCCI, is a 63 y.o. female  DOB [birth year]  MRN [id number].  Admission date:  12/17/2018  Admitting Physician  Sela Hua, MD  Discharge Date:  12/20/2018   Primary MD  [place] Pink, MD  Recommendations for primary care physician for things to follow:   Follow-up with PCP in 1 week Follow-up with gastroenterology Dr. Bonna Gains in 2 weeks Follow-up with Dr. Ubaldo Glassing as scheduled  Admission Diagnosis  Epigastric pain [R10.13] Accelerated junctional rhythm [I49.8] UGIB (upper gastrointestinal bleed) [K92.2] Acute on chronic congestive heart failure, unspecified heart failure type ([place]) [I50.9]   Discharge Diagnosis  Epigastric pain [R10.13] Accelerated junctional rhythm [I49.8] UGIB (upper gastrointestinal bleed) [K92.2] Acute on chronic congestive heart failure, unspecified heart failure type ([place]) [I50.9]   Active Problems:   Melena   Iron deficiency anemia   Gastric AVM   Columnar-lined esophagus   Angiodysplasia of intestinal tract   Internal hemorrhoids      Past Medical History:  Diagnosis Date  . Anemia   . Arthritis   . Atrial fibrillation ([place])   . Cardiomyopathy ([place])   . CHF (congestive heart failure) ([place])   . Coronary artery disease   . Diabetes mellitus without complication ([place])   . Dyspnea    with exertion  . Dysrhythmia   . GERD (gastroesophageal reflux disease)   . Headache    migraines  . Heart murmur   . History of shingles 2004  . Hyperlipidemia   . Hypertension   . Hypothyroidism   . IBS (irritable bowel syndrome)   . TIA (transient ischemic attack)   . Vitamin B 12 deficiency     Past Surgical History:  Procedure Laterality Date  . CARDIAC CATHETERIZATION    . COLONOSCOPY N/A 12/19/2018   Procedure: COLONOSCOPY;  Surgeon: Virgel Manifold, MD;  Location: [facility]  ENDOSCOPY;  Service: Endoscopy;  Laterality: N/A;  . CORONARY ARTERY BYPASS GRAFT    . ESOPHAGOGASTRODUODENOSCOPY N/A 12/19/2018   Procedure: ESOPHAGOGASTRODUODENOSCOPY (EGD);  Surgeon: Virgel Manifold, MD;  Location: [facility] ENDOSCOPY;  Service: Endoscopy;  Laterality: N/A;  . EYE SURGERY Bilateral    Cataract Extraction with IOL  . RIGHT/LEFT HEART CATH AND CORONARY ANGIOGRAPHY Bilateral 03/29/2017   Procedure: Right/Left Heart Cath and Coronary Angiography;  Surgeon: Teodoro Spray, MD;  Location: [facility] CV LAB;  Service: Cardiovascular;  Laterality: Bilateral;  . TUBAL LIGATION    . VENTRAL HERNIA REPAIR N/A 04/21/2017   Procedure: HERNIA REPAIR VENTRAL ADULT;  Surgeon: Leonie Green, MD;  Location: [facility] ORS;  Service: General;  Laterality: N/A;       History of present illness and  Hospital Course:     Kindly see H&P for history of present illness and admission details, please review complete Labs, Consult reports and Test reports for all details in brief  HPI  from the history and physical done on the day of admission 63 year old female patient history of multiple medical problems of essential hypertension, diastolic heart failure, diabetes mellitus type 2 sent because of shortness of breath, found to have melena going on for 3 days associated with epigastric abdominal pain.  Admitted for GI bleed.   Hospital Course  #1/gastric pain, melena, admitted for evaluation of upper GI bleed, seen by gastroenterology, started on Protonix infusion, discontinued her warfarin, patient had a EGD, colonoscopy by gastroenterology on December 19, 2018. Patient did not have any hematochezia.  No weight loss.  EGD showed nonbleeding erosions in gastric fundus, also had  5 nonbleeding angiectasis in gastric body, argon laser, applied, colonoscopy also showed colonic angiectasis, with argon laser applied.  Patient also had nonbleeding internal hemorrhoids.  Protonix infusion change to Protonix  40 mg IV twice daily, regular diet, today she is feeling better, started back on Coumadin for her A. fib.  Patient can go home, continue Protonix 40 mg daily for 4 weeks, check the iron studies, patient iron level 23, prescription for ferrous sulfate 325 mg p.o. twice daily, patient needs repeat endoscopy, follow-up in GI office in 2 weeks.  Patient also had a short segment Barrett's esophagus on EGD, biopsies are sent.  Follow-up with gastroenterology Dr. Bonna Gains in 2 weeks.   2.  Iron deficiency anemia, your prescription for ferrous sulfate 3.  Acute on chronic diastolic heart failure, elevated BNP on admission, patient follows with Dr. Ubaldo Glassing, Dr. Ubaldo Glassing recently increased her Lasix to 80 mg daily, patient received 40 IV twice daily, patient is feeling much better, and weight is also down.,  Stable 10 pounds since he is here.  Can continue home dose Lasix at 80 mg p.o. twice daily at discharge.   4.  Diabetes mellitus type 2, continue metformin,  #5 history of proximal atrial fibrillation, patient is on Coreg, Coumadin.  Coumadin resumed.  After discussing with gastroenterology, Dr. Charlestine Massed and he said patient can be started back on Coumadin  . 6.  History of ischemic cardiomyopathy, history of CABG.  Patient is on aspirin, statins.  Continue them  7.  New finding of cirrhosis, work-up sent for cirrhosis including hep A, hep C, mitochondrial antibodies, ceruloplasmin, anti-smooth muscle antibody, patient can follow-up with GI in 2 weeks regarding her diagnosis.    discharge Condition; stable   Follow UP      Discharge Instructions  and  Discharge Medications   n, patient can stay away from work till June 25, restart work without restrictions on June 26.   Allergies as of 12/20/2018      Reactions   Vioxx [rofecoxib] Swelling      Medication List    TAKE these medications   albuterol 108 (90 Base) MCG/ACT inhaler Commonly known as:  PROVENTIL HFA;VENTOLIN HFA Inhale 2 puffs into  the lungs every 6 (six) hours as needed for wheezing or shortness of breath.   ALPRAZolam 1 MG tablet Commonly known as:  XANAX Take 1 mg by mouth daily as needed for anxiety.   aspirin EC 81 MG tablet Take 81 mg by mouth daily.   carvedilol 3.125 MG tablet Commonly known as:  COREG Take 3.125 mg by mouth 2 (two) times daily with a meal.   ferrous sulfate 325 (65 FE) MG tablet Take 1 tablet (325 mg total) by mouth 2 (two) times daily with a meal.   furosemide 40 MG tablet Commonly known as:  LASIX Take 80 mg by mouth daily.   HYDROcodone-acetaminophen 5-325 MG tablet Commonly known as:  NORCO Take 1-2 tablets by mouth every 4 (four) hours as needed for moderate pain.   latanoprost 0.005 % ophthalmic solution Commonly known as:  XALATAN Place 1 drop into both eyes at bedtime.   levothyroxine 75 MCG tablet Commonly known as:  SYNTHROID, LEVOTHROID Take 75 mcg by mouth daily before breakfast.   lisinopril 5 MG tablet Commonly known as:  PRINIVIL,ZESTRIL Take 5 mg by mouth daily.   lovastatin 10 MG tablet Commonly known as:  MEVACOR Take 10 mg by mouth at bedtime.   metFORMIN 1000 MG tablet Commonly known as:  GLUCOPHAGE Take 1,000 mg by mouth 2 (two) times daily with a meal.   pantoprazole 40 MG tablet Commonly known as:  PROTONIX Take 1 tablet (40 mg total) by mouth daily.   spironolactone 25 MG tablet Commonly known as:  ALDACTONE Take 25 mg by mouth daily.   VITAMIN B 12 PO Take 1 tablet by mouth daily.   warfarin 3 MG tablet Commonly known as:  COUMADIN Take 1.5-3 mg by mouth at bedtime.         Diet and Activity recommendation: See Discharge Instructions above   Consults obtained -gastroenterology   Major procedures and Radiology Reports - PLEASE review detailed and final reports for all details, in brief -      Dg Chest 2 View  Result Date: 12/17/2018 CLINICAL DATA:  Os chronic shortness of breath with exertion for the past few months.  EXAM: CHEST - 2 VIEW COMPARISON:  Chest x-ray dated June 21, 2017. FINDINGS: Stable cardiomegaly. Mild bilateral interstitial thickening is unchanged. No focal consolidation, pleural effusion, or pneumothorax. No acute osseous abnormality. Prior median sternotomy. IMPRESSION: Stable cardiomegaly and mild interstitial edema. Electronically Signed   By: Titus Dubin M.D.   On: 12/17/2018 18:48   Ct Abdomen Pelvis W Contrast  Result Date: 12/17/2018 CLINICAL DATA:  63 year old female with abdominal pain. EXAM: CT ABDOMEN AND PELVIS WITH CONTRAST TECHNIQUE: Multidetector CT imaging of the abdomen and pelvis was performed using the standard protocol following bolus administration of intravenous contrast. CONTRAST:  162mL OMNIPAQUE IOHEXOL 300 MG/ML  SOLN COMPARISON:  CT of the abdomen pelvis dated 11/24/2010 FINDINGS: Lower chest: Partially visualized trace right pleural effusion. The visualized lung bases are otherwise clear. There is cardiomegaly. There is retrograde flow of contrast from the right atrium into the IVC in keeping with right heart dysfunction. No intra-abdominal free air or free fluid. Hepatobiliary: There is enlargement of the left lobe of the liver with slight surface irregularity in keeping with cirrhosis. There are multiple stones within the gallbladder. The gallbladder is predominantly contracted. There is diffuse thickened appearance of the gallbladder wall which may be chronic or related to underlying liver disease. Acute cholecystitis is not excluded. Ultrasound may provide better evaluation if there is high clinical concern for acute cholecystitis. Pancreas: Unremarkable. No pancreatic ductal dilatation or surrounding inflammatory changes. Spleen: Normal in size without focal abnormality. Adrenals/Urinary Tract: The adrenal glands are unremarkable. Subcentimeter bilateral renal hypodense lesions are too small to characterize. There is no hydronephrosis on either side. There is symmetric  enhancement and excretion of contrast by both kidneys. The visualized ureters and urinary bladder appear unremarkable. Stomach/Bowel: Small scattered sigmoid diverticula without active inflammatory changes. There is moderate amount of stool throughout the colon. There is no bowel obstruction or active inflammation. Normal appendix. Vascular/Lymphatic: Mild aortoiliac atherosclerotic disease. No portal venous gas. There is no adenopathy. Reproductive: The uterus and ovaries are grossly unremarkable. No pelvic mass. Other: Small fat containing umbilical hernia. No fluid collection. Mild diffuse subcutaneous edema. Musculoskeletal: Scoliosis and degenerative changes of the spine. No acute osseous pathology. IMPRESSION: 1. Morphologic changes of cirrhosis likely secondary to heart failure and passive congestion. 2. Cholelithiasis. Diffuse thickened appearance of the gallbladder wall may be chronic or related to underlying liver disease. Acute cholecystitis is not excluded. Ultrasound may provide better evaluation if there is high clinical concern for acute cholecystitis. 3. Moderate colonic stool burden. No bowel obstruction or active inflammation. Normal appendix. Electronically Signed   By: Anner Crete M.D.   On:  12/17/2018 22:00    Micro Results    No results found for this or any previous visit (from the past 240 hour(s)).     Today   Subjective:   Palma Holter today has no headache,no chest abdominal pain,no new weakness tingling or numbness, feels much better wants to go home today.   Objective:   Blood pressure (!) 100/47, pulse 66, temperature 98.1 F (36.7 C), temperature source Oral, resp. rate 17, height 5\' 1"  (1.549 m), weight 66 kg, SpO2 98 %.   Intake/Output Summary (Last 24 hours) at 12/20/2018 1050 Last data filed at 12/20/2018 0933 Gross per 24 hour  Intake 1380 ml  Output 2100 ml  Net -720 ml    Exam Awake Alert, Oriented x 3, No new F.N deficits, Normal  affect [place].AT,PERRAL Supple Neck,No JVD, No cervical lymphadenopathy appriciated.  Symmetrical Chest wall movement, Good air movement bilaterally, CTAB RRR,No Gallops,Rubs or new Murmurs, No Parasternal Heave +ve B.Sounds, Abd Soft, Non tender, No organomegaly appriciated, No rebound -guarding or rigidity. No Cyanosis, Clubbing or edema, No new Rash or bruise  Data Review   CBC w Diff:  Lab Results  Component Value Date   WBC 4.1 12/20/2018   HGB 7.8 (L) 12/20/2018   HCT 25.9 (L) 12/20/2018   PLT 135 (L) 12/20/2018   LYMPHOPCT 12 12/17/2018   MONOPCT 14 12/17/2018   EOSPCT 4 12/17/2018   BASOPCT 1 12/17/2018    CMP:  Lab Results  Component Value Date   NA 138 12/19/2018   K 3.1 (L) 12/19/2018   CL 101 12/19/2018   CO2 29 12/19/2018   BUN 11 12/19/2018   CREATININE 0.85 12/19/2018   PROT 6.7 12/18/2018   ALBUMIN 3.9 12/18/2018   BILITOT 1.0 12/18/2018   ALKPHOS 92 12/18/2018   AST 37 12/18/2018   ALT 22 12/18/2018  .   Total Time in preparing paper work, data evaluation and todays exam - 35 minutes  Epifanio Lesches M.D on 12/20/2018 at 10:50 AM    Note: This dictation was prepared with Dragon dictation along with smaller phrase technology. Any transcriptional errors that result from this process are unintentional.

## 2018-12-20 NOTE — Care Management Note (Signed)
Case Management Note  Patient Details  Name: OCEANIA NOORI MRN: [id number] Date of Birth: Jun 29, 1956  Subjective/Objective:     Patient is independent from home alone.  She was admitted with abdominal pain, leg pain and shortness of breath.    Action/Plan:   Discharging today.  History of CHF; chronic anemia.  She states she has a functioning scale at home but does not like to weigh because it is "depressing".  Educated patient on importance of daily weighs and low sodium diet.   She is current with her PCP and denies difficulties obtaining medications at [facility] or with accessing healthcare.  She is currently employed full time at [employer] as an Engineer, production.  CM consult for financial assistance.  Patient wanted assistance with this hospital bill.  Informed patient to call the number for customer service on her statement when she receives it and ask for assistance.  No further needs identified.             Expected Discharge Date:  12/20/18               Expected Discharge Plan:  Home/Self Care  In-House Referral:     Discharge planning Services  CM Consult  Post Acute Care Choice:    Choice offered to:     DME Arranged:    DME Agency:     HH Arranged:    HH Agency:     Status of Service:  Completed, signed off  If discussed at [employer] of Stay Meetings, dates discussed:    Additional Comments:  Elza Rafter, RN 12/20/2018, 2:10 PM

## 2018-12-20 NOTE — Progress Notes (Signed)
     Tammy Arias was admitted to the Hospital on 12/17/2018 and Discharged  12/20/2018 and should be excused from work/school   for 10  days starting 12/17/2018 , may return to work/school without any restrictions.    Epifanio Lesches M.D on 12/20/2018,at 12:05 PM

## 2018-12-20 NOTE — Progress Notes (Signed)
Waiting for her friend to pick her up.  Prescriptions sent to  [facility] in [place].  Appointments made with PCP, GI, and Cardiology.

## 2018-12-20 NOTE — Anesthesia Postprocedure Evaluation (Signed)
Anesthesia Post Note  Patient: Tammy Arias  Procedure(s) Performed: ESOPHAGOGASTRODUODENOSCOPY (EGD) (N/A ) COLONOSCOPY (N/A )  Patient location during evaluation: Endoscopy Anesthesia Type: General Level of consciousness: awake and alert Pain management: pain level controlled Vital Signs Assessment: post-procedure vital signs reviewed and stable Respiratory status: spontaneous breathing, nonlabored ventilation, respiratory function stable and patient connected to nasal cannula oxygen Cardiovascular status: blood pressure returned to baseline and stable Postop Assessment: no apparent nausea or vomiting Anesthetic complications: no     Last Vitals:  Vitals:   12/20/18 0319 12/20/18 0738  BP: (!) 94/57 (!) 100/47  Pulse: 97 66  Resp: 17   Temp: 37.2 C 36.7 C  SpO2: 97% 98%    Last Pain:  Vitals:   12/20/18 0900  TempSrc:   PainSc: 0-No pain                 Martha Clan

## 2018-12-21 LAB — BPAM RBC
Blood Product Expiration Date: 202002072359
Blood Product Expiration Date: 202002072359
Unit Type and Rh: 600
Unit Type and Rh: 600

## 2018-12-21 LAB — TYPE AND SCREEN
ABO/RH(D): A NEG
Antibody Screen: POSITIVE
Unit division: 0
Unit division: 0

## 2018-12-27 ENCOUNTER — Ambulatory Visit (INDEPENDENT_AMBULATORY_CARE_PROVIDER_SITE_OTHER): Payer: BLUE CROSS/BLUE SHIELD | Admitting: Gastroenterology

## 2018-12-27 ENCOUNTER — Encounter: Payer: Self-pay | Admitting: Gastroenterology

## 2018-12-27 VITALS — BP 92/57 | HR 98 | Ht 62.0 in | Wt 144.4 lb

## 2018-12-27 DIAGNOSIS — D500 Iron deficiency anemia secondary to blood loss (chronic): Secondary | ICD-10-CM

## 2018-12-27 DIAGNOSIS — Q2730 Arteriovenous malformation, site unspecified: Secondary | ICD-10-CM

## 2018-12-27 DIAGNOSIS — K7469 Other cirrhosis of liver: Secondary | ICD-10-CM

## 2018-12-27 NOTE — Progress Notes (Signed)
Tammy Antigua, MD [address]  [place], [place] [address]  Main: [phone]  Fax: [phone]   Primary Care Physician: Maryland Pink, MD  CC: Follow-up for anemia  HPI: Tammy Arias is a 64 y.o. 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  EGD, December 19, 2018 showed salmon-colored mucosa at the GE junction which was not biopsied due to anemia at the time.  5 nonbleeding angiectasia seen in the stomach treated with APC.  Gastric erosions were reported.  Otherwise normal study with no active bleeding.  Colonoscopy December 19, 2018 showed a fair prep, 7 nonbleeding angiectasia is noted treated with APC.  Nonbleeding internal hemorrhoids.  No active bleeding seen.  Patient's ferritin level was 11 and she was discharged on twice daily oral iron replacement.  The patient denies abdominal or flank pain, anorexia, nausea or vomiting, dysphagia, change in bowel habits or black or bloody stools or weight loss.  Patient has been resumed on her home Coumadin for her A. fib with no further signs of active bleeding.   Current Outpatient Medications  Medication Sig Dispense Refill  . albuterol (PROVENTIL HFA;VENTOLIN HFA) 108 (90 Base) MCG/ACT inhaler Inhale 2 puffs into the lungs every 6 (six) hours as needed for wheezing or shortness of breath.    . ALPRAZolam (XANAX) 1 MG tablet Take 1 mg by mouth daily as needed for anxiety.    Marland Kitchen aspirin EC 81 MG tablet Take 81 mg by mouth daily.    . carvedilol (COREG) 3.125 MG tablet Take 3.125 mg by mouth 2 (two) times daily with a meal.    . Cyanocobalamin (VITAMIN B 12 PO) Take 1 tablet by mouth daily.    . ferrous sulfate 325 (65 FE) MG tablet Take 1 tablet (325 mg total) by mouth 2 (two) times daily with a meal. 30 tablet 1  . furosemide (LASIX) 40 MG tablet Take 80 mg by mouth  daily.     Marland Kitchen HYDROcodone-acetaminophen (NORCO) 5-325 MG tablet Take 1-2 tablets by mouth every 4 (four) hours as needed for moderate pain. (Patient not taking: Reported on 12/17/2018) 12 tablet 0  . latanoprost (XALATAN) 0.005 % ophthalmic solution Place 1 drop into both eyes at bedtime.    Marland Kitchen levothyroxine (SYNTHROID, LEVOTHROID) 75 MCG tablet Take 75 mcg by mouth daily before breakfast.    . lisinopril (PRINIVIL,ZESTRIL) 5 MG tablet Take 5 mg by mouth daily.    Marland Kitchen lovastatin (MEVACOR) 10 MG tablet Take 10 mg by mouth at bedtime.    . metFORMIN (GLUCOPHAGE) 1000 MG tablet Take 1,000 mg by mouth 2 (two) times daily with a meal.    . pantoprazole (PROTONIX) 40 MG tablet Take 1 tablet (40 mg total) by mouth daily. 30 tablet 1  . spironolactone (ALDACTONE) 25 MG tablet Take 25 mg by mouth daily.    Marland Kitchen warfarin (COUMADIN) 3 MG tablet Take 1.5-3 mg by mouth at bedtime.      No current facility-administered medications for this visit.     Allergies as of 12/27/2018 - Review Complete 12/19/2018  Allergen Reaction Noted  . Vioxx [rofecoxib] Swelling 03/24/2017    ROS:  General: Negative for anorexia, weight loss, fever, chills, fatigue, weakness. ENT: Negative for hoarseness, difficulty swallowing , nasal congestion. CV: Negative for chest pain, angina, palpitations, dyspnea on exertion, peripheral edema.  Respiratory: Negative for dyspnea at rest, dyspnea on exertion, cough, sputum, wheezing.  GI: See history of present illness. GU:  Negative for dysuria, hematuria, urinary incontinence, urinary frequency, nocturnal urination.  Endo: Negative for unusual weight change.    Physical Examination: Vitals:   12/27/18 1333  BP: (!) 92/57  Pulse: 98  Weight: 144 lb 6.4 oz (65.5 kg)  Height: 5\' 2"  (1.575 m)     General: Well-nourished, well-developed in no acute distress.  Eyes: No icterus. Conjunctivae pink. Mouth: Oropharyngeal mucosa moist and pink , no lesions erythema or exudate. Neck:  Supple, Trachea midline Abdomen: Bowel sounds are normal, nontender, nondistended, no hepatosplenomegaly or masses, no abdominal bruits or hernia , no rebound or guarding.   Extremities: No lower extremity edema. No clubbing or deformities. Neuro: Alert and oriented x 3.  Grossly intact. Skin: Warm and dry, no jaundice.   Psych: Alert and cooperative, normal mood and affect.   Labs: CMP     Component Value Date/Time   NA 138 12/19/2018 0425   K 3.1 (L) 12/19/2018 0425   CL 101 12/19/2018 0425   CO2 29 12/19/2018 0425   GLUCOSE 99 12/19/2018 0425   BUN 11 12/19/2018 0425   CREATININE 0.85 12/19/2018 0425   CALCIUM 8.3 (L) 12/19/2018 0425   PROT 6.7 12/18/2018 0455   ALBUMIN 3.9 12/18/2018 0455   AST 37 12/18/2018 0455   ALT 22 12/18/2018 0455   ALKPHOS 92 12/18/2018 0455   BILITOT 1.0 12/18/2018 0455   GFRNONAA >60 12/19/2018 0425   GFRAA >60 12/19/2018 0425   Lab Results  Component Value Date   WBC 4.1 12/20/2018   HGB 7.8 (L) 12/20/2018   HCT 25.9 (L) 12/20/2018   MCV 84.1 12/20/2018   PLT 135 (L) 12/20/2018    Imaging Studies: Dg Chest 2 View  Result Date: 12/17/2018 CLINICAL DATA:  Os chronic shortness of breath with exertion for the past few months. EXAM: CHEST - 2 VIEW COMPARISON:  Chest x-ray dated June 21, 2017. FINDINGS: Stable cardiomegaly. Mild bilateral interstitial thickening is unchanged. No focal consolidation, pleural effusion, or pneumothorax. No acute osseous abnormality. Prior median sternotomy. IMPRESSION: Stable cardiomegaly and mild interstitial edema. Electronically Signed   By: Titus Dubin M.D.   On: 12/17/2018 18:48   Ct Abdomen Pelvis W Contrast  Result Date: 12/17/2018 CLINICAL DATA:  63 year old female with abdominal pain. EXAM: CT ABDOMEN AND PELVIS WITH CONTRAST TECHNIQUE: Multidetector CT imaging of the abdomen and pelvis was performed using the standard protocol following bolus administration of intravenous contrast. CONTRAST:  176mL  OMNIPAQUE IOHEXOL 300 MG/ML  SOLN COMPARISON:  CT of the abdomen pelvis dated 11/24/2010 FINDINGS: Lower chest: Partially visualized trace right pleural effusion. The visualized lung bases are otherwise clear. There is cardiomegaly. There is retrograde flow of contrast from the right atrium into the IVC in keeping with right heart dysfunction. No intra-abdominal free air or free fluid. Hepatobiliary: There is enlargement of the left lobe of the liver with slight surface irregularity in keeping with cirrhosis. There are multiple stones within the gallbladder. The gallbladder is predominantly contracted. There is diffuse thickened appearance of the gallbladder wall which may be chronic or related to underlying liver disease. Acute cholecystitis is not excluded. Ultrasound may provide better evaluation if there is high clinical concern for acute cholecystitis. Pancreas: Unremarkable. No pancreatic ductal dilatation or surrounding inflammatory changes. Spleen: Normal in size without focal abnormality. Adrenals/Urinary Tract: The adrenal glands are unremarkable. Subcentimeter bilateral renal hypodense lesions are  too small to characterize. There is no hydronephrosis on either side. There is symmetric enhancement and excretion of contrast by both kidneys. The visualized ureters and urinary bladder appear unremarkable. Stomach/Bowel: Small scattered sigmoid diverticula without active inflammatory changes. There is moderate amount of stool throughout the colon. There is no bowel obstruction or active inflammation. Normal appendix. Vascular/Lymphatic: Mild aortoiliac atherosclerotic disease. No portal venous gas. There is no adenopathy. Reproductive: The uterus and ovaries are grossly unremarkable. No pelvic mass. Other: Small fat containing umbilical hernia. No fluid collection. Mild diffuse subcutaneous edema. Musculoskeletal: Scoliosis and degenerative changes of the spine. No acute osseous pathology. IMPRESSION: 1.  Morphologic changes of cirrhosis likely secondary to heart failure and passive congestion. 2. Cholelithiasis. Diffuse thickened appearance of the gallbladder wall may be chronic or related to underlying liver disease. Acute cholecystitis is not excluded. Ultrasound may provide better evaluation if there is high clinical concern for acute cholecystitis. 3. Moderate colonic stool burden. No bowel obstruction or active inflammation. Normal appendix. Electronically Signed   By: Anner Crete M.D.   On: 12/17/2018 22:00    Assessment and Plan:   Tammy Arias is a 63 y.o. y/o female here for follow-up post hospitalization for anemia due to multiple AVMs seen in the stomach and colon  No signs of active GI bleeding We will recheck hemoglobin to confirm if this is improving Continue iron replacement orally for now, and follow-up with hematology Will refer to hematology for iron deficiency.  Cirrhosis seen on CT abdomen pelvis which is considered to be due to her CHF Follow-up closely with PCP and cardiology to optimize cardiac status. patient was seen in clinic by Dr. Ubaldo Glassing today, their note is pending.  Per patient, Dr. Ubaldo Glassing educated her on low-sodium diet and fluid intake.  No ascites present Viral and autoimmune hepatitis work-up for cirrhosis was negative.  Liver enzymes are normal and synthetic liver function is normal at this time. [place] screening up-to-date.  We will check AFP as well.    Patient will need [place] screening every 6 months  Her last colonoscopy showed a fair prep, would recommend screening colonoscopy in 1 year to evaluate and remove any polyps.  She will also need to have repeat EGD in the next 3 to 6 months for biopsies of possible Barrett's esophagus. She denies any heartburn or dysphagia or any alarm symptoms.  Patient educated extensively on acid reflux lifestyle modification, including buying a bed wedge, not eating 3 hrs before bedtime, diet modifications, and handout  given for the same.    Dr Tammy Arias

## 2019-01-01 ENCOUNTER — Other Ambulatory Visit: Payer: Self-pay

## 2019-01-01 DIAGNOSIS — D500 Iron deficiency anemia secondary to blood loss (chronic): Secondary | ICD-10-CM

## 2019-01-08 ENCOUNTER — Inpatient Hospital Stay: Payer: BLUE CROSS/BLUE SHIELD

## 2019-01-08 ENCOUNTER — Other Ambulatory Visit: Payer: Self-pay

## 2019-01-08 ENCOUNTER — Encounter: Payer: Self-pay | Admitting: Oncology

## 2019-01-08 ENCOUNTER — Inpatient Hospital Stay: Payer: BLUE CROSS/BLUE SHIELD | Attending: Oncology | Admitting: Oncology

## 2019-01-08 VITALS — BP 104/69 | HR 68 | Temp 97.2°F | Resp 18 | Wt 142.2 lb

## 2019-01-08 DIAGNOSIS — I10 Essential (primary) hypertension: Secondary | ICD-10-CM | POA: Diagnosis not present

## 2019-01-08 DIAGNOSIS — D509 Iron deficiency anemia, unspecified: Secondary | ICD-10-CM

## 2019-01-08 DIAGNOSIS — Z8673 Personal history of transient ischemic attack (TIA), and cerebral infarction without residual deficits: Secondary | ICD-10-CM | POA: Insufficient documentation

## 2019-01-08 DIAGNOSIS — I4891 Unspecified atrial fibrillation: Secondary | ICD-10-CM | POA: Diagnosis not present

## 2019-01-08 DIAGNOSIS — E119 Type 2 diabetes mellitus without complications: Secondary | ICD-10-CM | POA: Insufficient documentation

## 2019-01-08 DIAGNOSIS — Z79899 Other long term (current) drug therapy: Secondary | ICD-10-CM | POA: Insufficient documentation

## 2019-01-08 DIAGNOSIS — Z87891 Personal history of nicotine dependence: Secondary | ICD-10-CM

## 2019-01-08 DIAGNOSIS — Z7984 Long term (current) use of oral hypoglycemic drugs: Secondary | ICD-10-CM | POA: Insufficient documentation

## 2019-01-08 DIAGNOSIS — Z7982 Long term (current) use of aspirin: Secondary | ICD-10-CM | POA: Insufficient documentation

## 2019-01-08 DIAGNOSIS — Z951 Presence of aortocoronary bypass graft: Secondary | ICD-10-CM | POA: Insufficient documentation

## 2019-01-08 DIAGNOSIS — Z7901 Long term (current) use of anticoagulants: Secondary | ICD-10-CM | POA: Diagnosis not present

## 2019-01-08 DIAGNOSIS — E039 Hypothyroidism, unspecified: Secondary | ICD-10-CM | POA: Diagnosis not present

## 2019-01-08 DIAGNOSIS — D649 Anemia, unspecified: Secondary | ICD-10-CM | POA: Diagnosis not present

## 2019-01-08 DIAGNOSIS — E785 Hyperlipidemia, unspecified: Secondary | ICD-10-CM | POA: Diagnosis not present

## 2019-01-08 LAB — URINALYSIS, COMPLETE (UACMP) WITH MICROSCOPIC
Bacteria, UA: NONE SEEN
Bilirubin Urine: NEGATIVE
Glucose, UA: NEGATIVE mg/dL
Hgb urine dipstick: NEGATIVE
Ketones, ur: NEGATIVE mg/dL
LEUKOCYTES UA: NEGATIVE
Nitrite: NEGATIVE
Protein, ur: NEGATIVE mg/dL
Specific Gravity, Urine: 1.005 (ref 1.005–1.030)
pH: 7 (ref 5.0–8.0)

## 2019-01-08 LAB — COMPREHENSIVE METABOLIC PANEL
ALBUMIN: 4.6 g/dL (ref 3.5–5.0)
ALK PHOS: 100 U/L (ref 38–126)
ALT: 21 U/L (ref 0–44)
AST: 36 U/L (ref 15–41)
Anion gap: 12 (ref 5–15)
BUN: 21 mg/dL (ref 8–23)
CO2: 29 mmol/L (ref 22–32)
Calcium: 9.8 mg/dL (ref 8.9–10.3)
Chloride: 96 mmol/L — ABNORMAL LOW (ref 98–111)
Creatinine, Ser: 0.98 mg/dL (ref 0.44–1.00)
GFR calc Af Amer: >60 mL/min (ref >60)
GFR calc non Af Amer: >60 mL/min (ref >60)
Glucose, Bld: 97 mg/dL (ref 70–99)
Potassium: 3.9 mmol/L (ref 3.5–5.1)
Sodium: 137 mmol/L (ref 135–145)
Total Bilirubin: 0.6 mg/dL (ref 0.3–1.2)
Total Protein: 7.9 g/dL (ref 6.5–8.1)

## 2019-01-08 LAB — RETICULOCYTES
Immature Retic Fract: 5.9 % (ref 2.3–15.9)
RBC.: 4.17 MIL/uL (ref 3.87–5.11)
Retic Count, Absolute: 69.2 10*3/uL (ref 19.0–186.0)
Retic Ct Pct: 1.7 % (ref 0.4–3.1)

## 2019-01-08 LAB — CBC WITH DIFFERENTIAL/PLATELET
Abs Immature Granulocytes: 0.02 10*3/uL (ref 0.00–0.07)
Basophils Absolute: 0 10*3/uL (ref 0.0–0.1)
Basophils Relative: 0 %
Eosinophils Absolute: 0.4 10*3/uL (ref 0.0–0.5)
Eosinophils Relative: 8 %
HCT: 35.5 % — ABNORMAL LOW (ref 36.0–46.0)
Hemoglobin: 10.8 g/dL — ABNORMAL LOW (ref 12.0–15.0)
Immature Granulocytes: 0 %
LYMPHS ABS: 0.6 10*3/uL — AB (ref 0.7–4.0)
Lymphocytes Relative: 11 %
MCH: 25.9 pg — ABNORMAL LOW (ref 26.0–34.0)
MCHC: 30.4 g/dL (ref 30.0–36.0)
MCV: 85.1 fL (ref 80.0–100.0)
MONOS PCT: 8 %
Monocytes Absolute: 0.4 10*3/uL (ref 0.1–1.0)
Neutro Abs: 3.7 10*3/uL (ref 1.7–7.7)
Neutrophils Relative %: 73 %
Platelets: 172 10*3/uL (ref 150–400)
RBC: 4.17 MIL/uL (ref 3.87–5.11)
RDW: 19.7 % — ABNORMAL HIGH (ref 11.5–15.5)
WBC: 5.1 10*3/uL (ref 4.0–10.5)
nRBC: 0 % (ref 0.0–0.2)

## 2019-01-08 LAB — FOLATE: Folate: 45 ng/mL (ref >5.9)

## 2019-01-08 LAB — IRON AND TIBC
Iron: 89 ug/dL (ref 28–170)
Saturation Ratios: 19 % (ref 10.4–31.8)
TIBC: 480 ug/dL — ABNORMAL HIGH (ref 250–450)
UIBC: 391 ug/dL

## 2019-01-08 LAB — FERRITIN: FERRITIN: 30 ng/mL (ref 11–307)

## 2019-01-08 LAB — TSH: TSH: 2.026 u[IU]/mL (ref 0.350–4.500)

## 2019-01-08 LAB — VITAMIN B12: Vitamin B-12: 958 pg/mL — ABNORMAL HIGH (ref 180–914)

## 2019-01-08 NOTE — Progress Notes (Signed)
Hematology/Oncology Consult note [facility] [phone] [phone]  Patient Care Team: Maryland Pink, MD as PCP - General (Family Medicine)   Name of the patient: Tammy Arias  [id number]  1956-03-24    Reason for referral-iron deficiency anemia   Referring physician-Dr. Bonna Gains  Date of visit: 01/08/19   History of presenting illness-patient is a 63 year old Caucasian female with a past medical history significant for congestive heart failure with an EF of 35%, history of A. 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  Recent labs from 12/20/2018 showed white count of 4.1, H&H of 7.8/25.9 with an MCV of 84.1 and a platelet count of 135. Ferritin levels were low at 11.  Iron studies showed elevated TIBC of 527 and low iron saturation of 4%.  She underwent EGD and colonoscopy. EGD showed salmon-colored mucosa suspicious for short segment Barrett's.  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  Nonbleeding erosive gastropathy.  Normal examined duodenum.  No specimens collected.Colonoscopy showed 7 nonbleeding colonic angiectasia treated with APC.  Nonbleeding internal hemorrhoids.  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  She was discharged from the hospital on oral iron which has been causing some intermittent diarrhea and abdominal pain  ECOG PS- 1  Pain scale- 0   Review of systems- Review of Systems  Constitutional: Positive for malaise/fatigue. Negative for chills, fever and weight loss.  HENT: Negative for congestion, ear discharge and nosebleeds.   Eyes: Negative for blurred vision.  Respiratory: Negative for cough, hemoptysis, sputum production, shortness of breath and wheezing.   Cardiovascular: Negative for chest pain, palpitations, orthopnea and claudication.  Gastrointestinal: Positive for abdominal  pain and diarrhea. Negative for blood in stool, constipation, heartburn, melena, nausea and vomiting.  Genitourinary: Negative for dysuria, flank pain, frequency, hematuria and urgency.  Musculoskeletal: Negative for back pain, joint pain and myalgias.  Skin: Negative for rash.  Neurological: Negative for dizziness, tingling, focal weakness, seizures, weakness and headaches.  Endo/Heme/Allergies: Does not bruise/bleed easily.  Psychiatric/Behavioral: Negative for depression and suicidal ideas. The patient does not have insomnia.     Allergies  Allergen Reactions  . Vioxx [Rofecoxib] Swelling    Patient Active Problem List   Diagnosis Date Noted  . Iron deficiency anemia   . Gastric AVM   . Columnar-lined esophagus   . Angiodysplasia of intestinal tract   . Internal hemorrhoids   . Melena 12/17/2018  . Pulmonary hypertension ([place]) 03/29/2017  . Coronary artery disease involving native coronary artery of native heart with unstable angina pectoris ([place]) 03/21/2017  . Ischemic cardiomyopathy 03/21/2017  . A-fib ([place]) 10/08/2014     Past Medical History:  Diagnosis Date  . Anemia   . Arthritis   . Atrial fibrillation ([place])   . Cardiomyopathy ([place])   . CHF (congestive heart failure) ([place])   . Coronary artery disease   . Diabetes mellitus without complication ([place])   . Dyspnea    with exertion  . Dysrhythmia   . GERD (gastroesophageal reflux disease)   . Headache    migraines  . Heart murmur   . History of shingles 2004  . Hyperlipidemia   . Hypertension   . Hypothyroidism   . IBS (irritable bowel syndrome)   . TIA (transient ischemic attack)   . Vitamin B 12 deficiency      Past Surgical History:  Procedure  Laterality Date  . CARDIAC CATHETERIZATION    . COLONOSCOPY N/A 12/19/2018   Procedure: COLONOSCOPY;  Surgeon: Virgel Manifold, MD;  Location: [facility] ENDOSCOPY;  Service: Endoscopy;  Laterality: N/A;  . CORONARY ARTERY BYPASS GRAFT    .  ESOPHAGOGASTRODUODENOSCOPY N/A 12/19/2018   Procedure: ESOPHAGOGASTRODUODENOSCOPY (EGD);  Surgeon: Virgel Manifold, MD;  Location: [facility] ENDOSCOPY;  Service: Endoscopy;  Laterality: N/A;  . EYE SURGERY Bilateral    Cataract Extraction with IOL  . RIGHT/LEFT HEART CATH AND CORONARY ANGIOGRAPHY Bilateral 03/29/2017   Procedure: Right/Left Heart Cath and Coronary Angiography;  Surgeon: Teodoro Spray, MD;  Location: [facility] CV LAB;  Service: Cardiovascular;  Laterality: Bilateral;  . TUBAL LIGATION    . VENTRAL HERNIA REPAIR N/A 04/21/2017   Procedure: HERNIA REPAIR VENTRAL ADULT;  Surgeon: Leonie Green, MD;  Location: [facility] ORS;  Service: General;  Laterality: N/A;    Social History   Socioeconomic History  . Marital status: Widowed    Spouse name: Not on file  . Number of children: Not on file  . Years of education: Not on file  . Highest education level: Not on file  Occupational History  . Occupation: Automotive engineer  Social Needs  . Financial resource strain: Not on file  . Food insecurity:    Worry: Not on file    Inability: Not on file  . Transportation needs:    Medical: Not on file    Non-medical: Not on file  Tobacco Use  . Smoking status: Former Smoker    Types: Cigarettes    Last attempt to quit: 03/30/1999    Years since quitting: 19.7  . Smokeless tobacco: Never Used  Substance and Sexual Activity  . Alcohol use: No  . Drug use: No  . Sexual activity: Not Currently  Lifestyle  . Physical activity:    Days per week: Not on file    Minutes per session: Not on file  . Stress: Not on file  Relationships  . Social connections:    Talks on phone: Not on file    Gets together: Not on file    Attends religious service: Not on file    Active member of club or organization: Not on file    Attends meetings of clubs or organizations: Not on file    Relationship status: Not on file  . Intimate partner violence:    Fear of current or ex  partner: Not on file    Emotionally abused: Not on file    Physically abused: Not on file    Forced sexual activity: Not on file  Other Topics Concern  . Not on file  Social History Narrative  . Not on file     Family History  Problem Relation Age of Onset  . Cancer Sister        lung  . Cancer Sister        cervical  . Valvular heart disease Mother   . Diabetes Father   . Breast cancer Neg Hx      Current Outpatient Medications:  .  aspirin EC 81 MG tablet, Take 81 mg by mouth daily., Disp: , Rfl:  .  carvedilol (COREG) 3.125 MG tablet, Take 3.125 mg by mouth 2 (two) times daily with a meal., Disp: , Rfl:  .  Cyanocobalamin (VITAMIN B 12 PO), Take 1 tablet by mouth daily., Disp: , Rfl:  .  ferrous sulfate 325 (65 FE) MG tablet, Take 1 tablet (325 mg  total) by mouth 2 (two) times daily with a meal., Disp: 30 tablet, Rfl: 1 .  furosemide (LASIX) 40 MG tablet, Take 80 mg by mouth daily. , Disp: , Rfl:  .  latanoprost (XALATAN) 0.005 % ophthalmic solution, Place 1 drop into both eyes at bedtime., Disp: , Rfl:  .  levothyroxine (SYNTHROID, LEVOTHROID) 75 MCG tablet, Take 75 mcg by mouth daily before breakfast., Disp: , Rfl:  .  lisinopril (PRINIVIL,ZESTRIL) 5 MG tablet, Take 5 mg by mouth daily., Disp: , Rfl:  .  lovastatin (MEVACOR) 10 MG tablet, Take 10 mg by mouth at bedtime., Disp: , Rfl:  .  metFORMIN (GLUCOPHAGE) 1000 MG tablet, Take 1,000 mg by mouth 2 (two) times daily with a meal., Disp: , Rfl:  .  pantoprazole (PROTONIX) 40 MG tablet, Take 1 tablet (40 mg total) by mouth daily., Disp: 30 tablet, Rfl: 1 .  spironolactone (ALDACTONE) 25 MG tablet, Take 25 mg by mouth daily., Disp: , Rfl:  .  warfarin (COUMADIN) 3 MG tablet, Take 3 mg by mouth at bedtime. 3.5mg  M-W-F, 3mg  Tues, Thurs, Sat, Sun, Disp: , Rfl:  .  albuterol (PROVENTIL HFA;VENTOLIN HFA) 108 (90 Base) MCG/ACT inhaler, Inhale 2 puffs into the lungs every 6 (six) hours as needed for wheezing or shortness of breath.,  Disp: , Rfl:  .  ALPRAZolam (XANAX) 1 MG tablet, Take 1 mg by mouth daily as needed for anxiety., Disp: , Rfl:    Physical exam:  Vitals:   01/08/19 0926  BP: 104/69  Pulse: 68  Resp: 18  Temp: (!) 97.2 F (36.2 C)  SpO2: 98%  Weight: 142 lb 3 oz (64.5 kg)   Physical Exam Constitutional:      General: She is not in acute distress. HENT:     Head: Normocephalic and atraumatic.  Eyes:     Pupils: Pupils are equal, round, and reactive to light.  Neck:     Musculoskeletal: Normal range of motion.  Cardiovascular:     Rate and Rhythm: Normal rate and regular rhythm.     Heart sounds: Normal heart sounds.  Pulmonary:     Effort: Pulmonary effort is normal.     Breath sounds: Normal breath sounds.  Abdominal:     General: Bowel sounds are normal.     Palpations: Abdomen is soft.  Musculoskeletal:     Right lower leg: No edema.     Left lower leg: No edema.  Skin:    General: Skin is warm and dry.  Neurological:     Mental Status: She is alert and oriented to person, place, and time.        CMP Latest Ref Rng & Units 12/19/2018  Glucose 70 - 99 mg/dL 99  BUN 8 - 23 mg/dL 11  Creatinine 0.44 - 1.00 mg/dL 0.85  Sodium 135 - 145 mmol/L 138  Potassium 3.5 - 5.1 mmol/L 3.1(L)  Chloride 98 - 111 mmol/L 101  CO2 22 - 32 mmol/L 29  Calcium 8.9 - 10.3 mg/dL 8.3(L)  Total Protein 6.5 - 8.1 g/dL -  Total Bilirubin 0.3 - 1.2 mg/dL -  Alkaline Phos 38 - 126 U/L -  AST 15 - 41 U/L -  ALT 0 - 44 U/L -   CBC Latest Ref Rng & Units 12/20/2018  WBC 4.0 - 10.5 K/uL 4.1  Hemoglobin 12.0 - 15.0 g/dL 7.8(L)  Hematocrit 36.0 - 46.0 % 25.9(L)  Platelets 150 - 400 K/uL 135(L)    No images are  attached to the encounter.  Dg Chest 2 View  Result Date: 12/17/2018 CLINICAL DATA:  Os chronic shortness of breath with exertion for the past few months. EXAM: CHEST - 2 VIEW COMPARISON:  Chest x-ray dated June 21, 2017. FINDINGS: Stable cardiomegaly. Mild bilateral interstitial thickening  is unchanged. No focal consolidation, pleural effusion, or pneumothorax. No acute osseous abnormality. Prior median sternotomy. IMPRESSION: Stable cardiomegaly and mild interstitial edema. Electronically Signed   By: Titus Dubin M.D.   On: 12/17/2018 18:48   Ct Abdomen Pelvis W Contrast  Result Date: 12/17/2018 CLINICAL DATA:  63 year old female with abdominal pain. EXAM: CT ABDOMEN AND PELVIS WITH CONTRAST TECHNIQUE: Multidetector CT imaging of the abdomen and pelvis was performed using the standard protocol following bolus administration of intravenous contrast. CONTRAST:  174mL OMNIPAQUE IOHEXOL 300 MG/ML  SOLN COMPARISON:  CT of the abdomen pelvis dated 11/24/2010 FINDINGS: Lower chest: Partially visualized trace right pleural effusion. The visualized lung bases are otherwise clear. There is cardiomegaly. There is retrograde flow of contrast from the right atrium into the IVC in keeping with right heart dysfunction. No intra-abdominal free air or free fluid. Hepatobiliary: There is enlargement of the left lobe of the liver with slight surface irregularity in keeping with cirrhosis. There are multiple stones within the gallbladder. The gallbladder is predominantly contracted. There is diffuse thickened appearance of the gallbladder wall which may be chronic or related to underlying liver disease. Acute cholecystitis is not excluded. Ultrasound may provide better evaluation if there is high clinical concern for acute cholecystitis. Pancreas: Unremarkable. No pancreatic ductal dilatation or surrounding inflammatory changes. Spleen: Normal in size without focal abnormality. Adrenals/Urinary Tract: The adrenal glands are unremarkable. Subcentimeter bilateral renal hypodense lesions are too small to characterize. There is no hydronephrosis on either side. There is symmetric enhancement and excretion of contrast by both kidneys. The visualized ureters and urinary bladder appear unremarkable. Stomach/Bowel:  Small scattered sigmoid diverticula without active inflammatory changes. There is moderate amount of stool throughout the colon. There is no bowel obstruction or active inflammation. Normal appendix. Vascular/Lymphatic: Mild aortoiliac atherosclerotic disease. No portal venous gas. There is no adenopathy. Reproductive: The uterus and ovaries are grossly unremarkable. No pelvic mass. Other: Small fat containing umbilical hernia. No fluid collection. Mild diffuse subcutaneous edema. Musculoskeletal: Scoliosis and degenerative changes of the spine. No acute osseous pathology. IMPRESSION: 1. Morphologic changes of cirrhosis likely secondary to heart failure and passive congestion. 2. Cholelithiasis. Diffuse thickened appearance of the gallbladder wall may be chronic or related to underlying liver disease. Acute cholecystitis is not excluded. Ultrasound may provide better evaluation if there is high clinical concern for acute cholecystitis. 3. Moderate colonic stool burden. No bowel obstruction or active inflammation. Normal appendix. Electronically Signed   By: Anner Crete M.D.   On: 12/17/2018 22:00    Assessment and plan- Patient is a 63 y.o. female referred for iron deficiency anemia  Iron deficiency anemia likely secondary to bleeding AVM in the stomach and colon.  She will likely have a small bowel capsule endoscopy to rule out small bowel AVMs as well in the future.  Given that she has significant anemia with evidence of iron deficiency she will benefit from IV iron and I recommend 2 doses of Feraheme 510 mg IV weekly.  I will see her back in 2 months time with CBC ferritin and iron studies  Given that her anemia is normocytic I will also look for other causes of anemia and I will do a comprehensive  work-up including CBC with differential, CMP, ferritin and iron studies, B12 and folate, TSH, reticulocyte count, haptoglobin, myeloma panel today.  I will also check celiac disease panel, urinalysis and  stool H. pylori antigen.   Thank you for this kind referral and the opportunity to participate in the care of this patient   Visit Diagnosis 1. Iron deficiency anemia, unspecified iron deficiency anemia type   2. Normocytic anemia     Dr. Randa Evens, MD, MPH [facility] [phone] 01/08/2019  10:19 AM

## 2019-01-08 NOTE — Progress Notes (Signed)
New patient in for iron deficiency anemia.

## 2019-01-09 ENCOUNTER — Other Ambulatory Visit: Payer: Self-pay

## 2019-01-09 DIAGNOSIS — D509 Iron deficiency anemia, unspecified: Secondary | ICD-10-CM | POA: Diagnosis not present

## 2019-01-09 DIAGNOSIS — D649 Anemia, unspecified: Secondary | ICD-10-CM

## 2019-01-09 LAB — HAPTOGLOBIN: Haptoglobin: 232 mg/dL (ref 37–355)

## 2019-01-10 LAB — MULTIPLE MYELOMA PANEL, SERUM
ALPHA 1: 0.3 g/dL (ref 0.0–0.4)
Albumin SerPl Elph-Mcnc: 4.3 g/dL (ref 2.9–4.4)
Albumin/Glob SerPl: 1.4 (ref 0.7–1.7)
Alpha2 Glob SerPl Elph-Mcnc: 0.9 g/dL (ref 0.4–1.0)
B-Globulin SerPl Elph-Mcnc: 1.2 g/dL (ref 0.7–1.3)
Gamma Glob SerPl Elph-Mcnc: 0.8 g/dL (ref 0.4–1.8)
Globulin, Total: 3.2 g/dL (ref 2.2–3.9)
IgA: 259 mg/dL (ref 87–352)
IgG (Immunoglobin G), Serum: 901 mg/dL (ref 700–1600)
IgM (Immunoglobulin M), Srm: 121 mg/dL (ref 26–217)
Total Protein ELP: 7.5 g/dL (ref 6.0–8.5)

## 2019-01-10 LAB — CELIAC DISEASE PANEL
Endomysial Ab, IgA: NEGATIVE
IgA: 262 mg/dL (ref 87–352)
Tissue Transglutaminase Ab, IgA: <2 U/mL (ref 0–3)

## 2019-01-11 LAB — H. PYLORI ANTIGEN, STOOL: H. Pylori Stool Ag, Eia: NEGATIVE

## 2019-01-16 ENCOUNTER — Inpatient Hospital Stay: Payer: BLUE CROSS/BLUE SHIELD

## 2019-01-16 VITALS — BP 101/64 | HR 80 | Temp 97.0°F | Resp 18

## 2019-01-16 DIAGNOSIS — D500 Iron deficiency anemia secondary to blood loss (chronic): Secondary | ICD-10-CM

## 2019-01-16 DIAGNOSIS — D509 Iron deficiency anemia, unspecified: Secondary | ICD-10-CM | POA: Diagnosis not present

## 2019-01-16 MED ORDER — SODIUM CHLORIDE 0.9 % IV SOLN
510.0000 mg | Freq: Once | INTRAVENOUS | Status: AC
  Administered 2019-01-16: 510 mg via INTRAVENOUS
  Filled 2019-01-16: qty 17

## 2019-01-16 MED ORDER — SODIUM CHLORIDE 0.9 % IV SOLN
Freq: Once | INTRAVENOUS | Status: AC
  Administered 2019-01-16: 14:00:00 via INTRAVENOUS
  Filled 2019-01-16: qty 250

## 2019-01-18 ENCOUNTER — Ambulatory Visit: Payer: BLUE CROSS/BLUE SHIELD

## 2019-01-24 ENCOUNTER — Inpatient Hospital Stay: Payer: BLUE CROSS/BLUE SHIELD

## 2019-01-24 VITALS — BP 98/60 | HR 77 | Temp 97.8°F | Resp 18

## 2019-01-24 DIAGNOSIS — D500 Iron deficiency anemia secondary to blood loss (chronic): Secondary | ICD-10-CM

## 2019-01-24 DIAGNOSIS — D509 Iron deficiency anemia, unspecified: Secondary | ICD-10-CM | POA: Diagnosis not present

## 2019-01-24 MED ORDER — SODIUM CHLORIDE 0.9 % IV SOLN
Freq: Once | INTRAVENOUS | Status: AC
  Administered 2019-01-24: 14:00:00 via INTRAVENOUS
  Filled 2019-01-24: qty 250

## 2019-01-24 MED ORDER — SODIUM CHLORIDE 0.9 % IV SOLN
510.0000 mg | Freq: Once | INTRAVENOUS | Status: AC
  Administered 2019-01-24: 510 mg via INTRAVENOUS
  Filled 2019-01-24: qty 17

## 2019-01-25 ENCOUNTER — Ambulatory Visit: Payer: BLUE CROSS/BLUE SHIELD

## 2019-02-28 ENCOUNTER — Other Ambulatory Visit: Payer: Self-pay | Admitting: *Deleted

## 2019-02-28 DIAGNOSIS — D500 Iron deficiency anemia secondary to blood loss (chronic): Secondary | ICD-10-CM

## 2019-03-07 ENCOUNTER — Other Ambulatory Visit: Payer: Self-pay | Admitting: Gastroenterology

## 2019-03-07 NOTE — Telephone Encounter (Signed)
I spoke with pt and she did not know whether or not she needed to continue Protonix. She states the only thing is when she drinks something cold and does use ice that she has pain in her midsternum and has to wait a bit to drink any more. This is with foods too. The protonix did not help with this. Pt does not have a smart phone or a computer and will need a telephone call if unable to see in office on 05/02/19. Appointment message looks like a copy and paste from April. She is not sure if this is an office visit or what. I am putting a note in appt slot that if telemed visit needed she will need an office appt.

## 2019-03-07 NOTE — Telephone Encounter (Signed)
*  STAT* If patient is at the pharmacy, call can be transferred to refill team.   1. Which medications need to be refilled? (please list name of each medication and dose if known)  pantoprazole (PROTONIX) 40 MG tablet 40 mg, Daily     2. Which pharmacy/location (including street and city if local pharmacy) is medication to be sent to? [facility] in [place]  3. Do they need a 30 day or 90 day supply? 90 day

## 2019-03-08 ENCOUNTER — Inpatient Hospital Stay: Payer: BLUE CROSS/BLUE SHIELD

## 2019-03-08 ENCOUNTER — Ambulatory Visit: Payer: BLUE CROSS/BLUE SHIELD | Admitting: Oncology

## 2019-03-12 ENCOUNTER — Inpatient Hospital Stay: Payer: BLUE CROSS/BLUE SHIELD | Admitting: Oncology

## 2019-03-18 NOTE — Telephone Encounter (Signed)
Left detailed message on phone that no refill of Protonix needed but will need to schedule a telephone visit. Pt does not have a smart phone or computer. Asked pt to contact office for an appointment for phone visit but I will also send front desk a message to also try to contact her.

## 2019-03-27 ENCOUNTER — Other Ambulatory Visit: Payer: Self-pay | Admitting: *Deleted

## 2019-03-27 DIAGNOSIS — D500 Iron deficiency anemia secondary to blood loss (chronic): Secondary | ICD-10-CM

## 2019-03-28 ENCOUNTER — Ambulatory Visit: Payer: BLUE CROSS/BLUE SHIELD | Admitting: Gastroenterology

## 2019-04-17 NOTE — Telephone Encounter (Signed)
LVM to call the office to schedule a tele visit.

## 2019-05-02 ENCOUNTER — Ambulatory Visit: Payer: BLUE CROSS/BLUE SHIELD | Admitting: Gastroenterology

## 2019-05-21 ENCOUNTER — Inpatient Hospital Stay: Payer: BC Managed Care – PPO | Admitting: Oncology

## 2019-06-05 ENCOUNTER — Other Ambulatory Visit: Payer: Self-pay

## 2019-06-05 ENCOUNTER — Ambulatory Visit: Payer: Self-pay | Admitting: Gastroenterology

## 2019-06-05 ENCOUNTER — Encounter (INDEPENDENT_AMBULATORY_CARE_PROVIDER_SITE_OTHER): Payer: Self-pay

## 2019-06-05 ENCOUNTER — Ambulatory Visit: Payer: BLUE CROSS/BLUE SHIELD | Admitting: Gastroenterology

## 2019-06-05 ENCOUNTER — Encounter: Payer: Self-pay | Admitting: Gastroenterology

## 2019-06-05 VITALS — BP 82/50 | HR 90 | Temp 98.6°F | Ht 62.0 in | Wt 139.4 lb

## 2019-06-05 DIAGNOSIS — D500 Iron deficiency anemia secondary to blood loss (chronic): Secondary | ICD-10-CM

## 2019-06-05 DIAGNOSIS — Z23 Encounter for immunization: Secondary | ICD-10-CM

## 2019-06-05 DIAGNOSIS — K7469 Other cirrhosis of liver: Secondary | ICD-10-CM

## 2019-06-05 NOTE — Progress Notes (Signed)
Vonda Antigua, MD [address]  [place]  [place],  [address]  Main: [phone]  Fax: [phone]   Primary Care Physician: Maryland Pink, MD   Chief Complaint  Patient presents with  . 3 month follow up    HPI: Tammy Arias is a 63 y.o. 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  Iron deficiency anemia now improved with iron replacement by hematology.  Patient's only complaint in review of systems is a spasm-like pain at the xiphoid notch for 5-10 seconds after drinking cold liquids.  Does not occur with any solid foods or room temperature or warm liquids at all.  No dysphagia whatsoever.  The patient denies abdominal or flank pain, anorexia, nausea or vomiting, dysphagia, change in bowel habits or black or bloody stools or weight loss.  EGD, December 19, 2018 showed salmon-colored mucosa at the GE junction which was not biopsied due to anemia at the time.  5 nonbleeding angiectasia seen in the stomach treated with APC.  Gastric erosions were reported.  Otherwise normal study with no active bleeding.  Colonoscopy December 19, 2018 showed a fair prep, 7 nonbleeding angiectasia is noted treated with APC.  Nonbleeding internal hemorrhoids.  No active bleeding seen.  Patient also was found to have cirrhosis on CT abdomen pelvis and this is thought to be due to her CHF and she is following cardiology for this.  Viral and autoimmune hepatitis work-up has been negative.  Liver enzymes have been normal  Current Outpatient Medications  Medication Sig Dispense Refill  . albuterol (PROVENTIL HFA;VENTOLIN HFA) 108 (90 Base) MCG/ACT inhaler Inhale 2 puffs into the lungs every 6 (six) hours as needed for wheezing or shortness of breath.    . ALPRAZolam (XANAX) 1 MG tablet Take 1 mg by mouth daily as needed for anxiety.    Marland Kitchen aspirin EC 81 MG tablet Take 81 mg by mouth daily.    . carvedilol (COREG) 3.125 MG tablet Take 3.125  mg by mouth 2 (two) times daily with a meal.    . Cyanocobalamin (VITAMIN B 12 PO) Take 1 tablet by mouth daily.    . ferrous sulfate 325 (65 FE) MG tablet Take 1 tablet (325 mg total) by mouth 2 (two) times daily with a meal. 30 tablet 1  . furosemide (LASIX) 40 MG tablet Take 80 mg by mouth daily.     . furosemide (LASIX) 40 MG tablet     . latanoprost (XALATAN) 0.005 % ophthalmic solution Place 1 drop into both eyes at bedtime.    Marland Kitchen levothyroxine (SYNTHROID, LEVOTHROID) 75 MCG tablet Take 75 mcg by mouth daily before breakfast.    . lisinopril (PRINIVIL,ZESTRIL) 5 MG tablet Take 5 mg by mouth daily.    Marland Kitchen lovastatin (MEVACOR) 10 MG tablet Take 10 mg by mouth at bedtime.    . metFORMIN (GLUCOPHAGE) 1000 MG tablet Take 1,000 mg by mouth 2 (two) times daily with a meal.    . pantoprazole (PROTONIX) 40 MG tablet Take 1 tablet (40 mg total) by mouth daily. 30 tablet 1  . spironolactone (ALDACTONE) 25 MG tablet Take 25 mg by mouth daily.    Marland Kitchen warfarin (COUMADIN) 3 MG tablet Take 3 mg by mouth at bedtime. 3.5mg  M-W-F, 3mg  Tues, Thurs, Sat, Sun     No current facility-administered medications for this visit.     Allergies as of 06/05/2019 - Review Complete 06/05/2019  Allergen Reaction Noted  .  Vioxx [rofecoxib] Swelling 03/24/2017    ROS:  General: Negative for anorexia, weight loss, fever, chills, fatigue, weakness. ENT: Negative for hoarseness, difficulty swallowing , nasal congestion. CV: Negative for chest pain, angina, palpitations, dyspnea on exertion, peripheral edema.  Respiratory: Negative for dyspnea at rest, dyspnea on exertion, cough, sputum, wheezing.  GI: See history of present illness. GU:  Negative for dysuria, hematuria, urinary incontinence, urinary frequency, nocturnal urination.  Endo: Negative for unusual weight change.    Physical Examination:   BP (!) 82/50   Pulse 90   Temp 98.6 F (37 C) (Oral)   Ht 5\' 2"  (1.575 m)   Wt 139 lb 6.4 oz (63.2 kg)   BMI 25.50  kg/m   General: Well-nourished, well-developed in no acute distress.  Eyes: No icterus. Conjunctivae pink. Mouth: Oropharyngeal mucosa moist and pink , no lesions erythema or exudate. Neck: Supple, Trachea midline Abdomen: Bowel sounds are normal, nontender, nondistended, no hepatosplenomegaly or masses, no abdominal bruits or hernia , no rebound or guarding.   Extremities: No lower extremity edema. No clubbing or deformities. Neuro: Alert and oriented x 3.  Grossly intact. Skin: Warm and dry, no jaundice.   Psych: Alert and cooperative, normal mood and affect.   Labs: CMP     Component Value Date/Time   NA 137 01/08/2019 1025   K 3.9 01/08/2019 1025   CL 96 (L) 01/08/2019 1025   CO2 29 01/08/2019 1025   GLUCOSE 97 01/08/2019 1025   BUN 21 01/08/2019 1025   CREATININE 0.98 01/08/2019 1025   CALCIUM 9.8 01/08/2019 1025   PROT 7.9 01/08/2019 1025   ALBUMIN 4.6 01/08/2019 1025   AST 36 01/08/2019 1025   ALT 21 01/08/2019 1025   ALKPHOS 100 01/08/2019 1025   BILITOT 0.6 01/08/2019 1025   GFRNONAA >60 01/08/2019 1025   GFRAA >60 01/08/2019 1025   Lab Results  Component Value Date   WBC 5.1 01/08/2019   HGB 10.8 (L) 01/08/2019   HCT 35.5 (L) 01/08/2019   MCV 85.1 01/08/2019   PLT 172 01/08/2019    Imaging Studies: No results found.  Assessment and Plan:   Tammy Arias is a 63 y.o. 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  However, if they are not actively bleeding and are small and are more distal, this would require an extended balloon enteroscopy procedure for treating these.  Versus continuing  conservative management given that her anemia has improved and other AVMs were found and she did in the stomach and colon which have also led to improvement in her anemia.  Patient continues to proceed with conservative management.  This is reasonable, especially since source of anemia was confirmed with AVMs seen in her stomach and colon, and therefore there is no need for small bowel capsule at this time.  However, if active bleeding or worsening anemia occur, small bowel capsule can be considered.  Patient is now on diuretics by cardiology for CHF Will up-to-date [place] screening with ultrasound and AFP levels  Patient prefers a repeat EGD for biopsies of salmon-colored mucosa to evaluate for Barrett's to be at a later time as she  states she just retired and does not have insurance.  But will have Medicare in the upcoming months.  She states she would prefer her EGD and colonoscopy to be done together.  Her colonoscopy is indicated in 1 year from the last procedure given her fair prep on that exam.  Therefore as per her preference, we will schedule EGD and colonoscopy after her next visit in 6 months  Follow-up in 6 months   Dr Vonda Antigua

## 2019-06-05 NOTE — Patient Instructions (Addendum)
You are scheduled for a RUQ abdominal US at [facility] on Monday, July 13th at 9:00am. Please arrive at the [facility] registration desk at 8:45am. You cannot have anything to eat or drink after midnight on Sunday night.    If you need to reschedule this appointment for any reason, please contact central scheduling at [phone].

## 2019-06-06 LAB — HEPATIC FUNCTION PANEL
ALT: 35 IU/L — ABNORMAL HIGH (ref 0–32)
AST: 34 IU/L (ref 0–40)
Albumin: 4.7 g/dL (ref 3.8–4.8)
Alkaline Phosphatase: 75 IU/L (ref 39–117)
Bilirubin Total: <0.2 mg/dL (ref 0.0–1.2)
Bilirubin, Direct: 0.08 mg/dL (ref 0.00–0.40)
Total Protein: 6.9 g/dL (ref 6.0–8.5)

## 2019-06-06 LAB — AFP TUMOR MARKER: AFP, Serum, Tumor Marker: 4.3 ng/mL (ref 0.0–8.3)

## 2019-06-06 LAB — PROTIME-INR
INR: 1.7 — ABNORMAL HIGH (ref 0.8–1.2)
Prothrombin Time: 17.5 s — ABNORMAL HIGH (ref 9.1–12.0)

## 2019-06-10 ENCOUNTER — Ambulatory Visit
Admission: RE | Admit: 2019-06-10 | Discharge: 2019-06-10 | Disposition: A | Discharge status: Home / Self Care | Payer: Self-pay | Source: Ambulatory Visit | Attending: Gastroenterology | Admitting: Gastroenterology

## 2019-06-10 ENCOUNTER — Other Ambulatory Visit: Payer: Self-pay

## 2019-06-10 DIAGNOSIS — K7469 Other cirrhosis of liver: Secondary | ICD-10-CM | POA: Insufficient documentation

## 2019-06-18 ENCOUNTER — Inpatient Hospital Stay: Payer: Self-pay

## 2019-06-18 ENCOUNTER — Telehealth: Payer: Self-pay | Admitting: *Deleted

## 2019-06-18 ENCOUNTER — Inpatient Hospital Stay: Payer: Self-pay | Attending: Oncology | Admitting: Oncology

## 2019-06-18 ENCOUNTER — Other Ambulatory Visit: Payer: Self-pay

## 2019-06-18 VITALS — BP 85/54 | HR 68 | Temp 98.8°F | Resp 18 | Wt 140.1 lb

## 2019-06-18 DIAGNOSIS — I4891 Unspecified atrial fibrillation: Secondary | ICD-10-CM | POA: Insufficient documentation

## 2019-06-18 DIAGNOSIS — Z87891 Personal history of nicotine dependence: Secondary | ICD-10-CM | POA: Insufficient documentation

## 2019-06-18 DIAGNOSIS — D500 Iron deficiency anemia secondary to blood loss (chronic): Secondary | ICD-10-CM

## 2019-06-18 DIAGNOSIS — Z7901 Long term (current) use of anticoagulants: Secondary | ICD-10-CM | POA: Insufficient documentation

## 2019-06-18 DIAGNOSIS — D696 Thrombocytopenia, unspecified: Secondary | ICD-10-CM

## 2019-06-18 LAB — CBC WITH DIFFERENTIAL/PLATELET
Abs Immature Granulocytes: 0.01 10*3/uL (ref 0.00–0.07)
Basophils Absolute: 0 10*3/uL (ref 0.0–0.1)
Basophils Relative: 0 %
Eosinophils Absolute: 0.5 10*3/uL (ref 0.0–0.5)
Eosinophils Relative: 16 %
HCT: 29.9 % — ABNORMAL LOW (ref 36.0–46.0)
Hemoglobin: 9.9 g/dL — ABNORMAL LOW (ref 12.0–15.0)
Immature Granulocytes: 0 %
Lymphocytes Relative: 16 %
Lymphs Abs: 0.5 10*3/uL — ABNORMAL LOW (ref 0.7–4.0)
MCH: 33.9 pg (ref 26.0–34.0)
MCHC: 33.1 g/dL (ref 30.0–36.0)
MCV: 102.4 fL — ABNORMAL HIGH (ref 80.0–100.0)
Monocytes Absolute: 0.4 10*3/uL (ref 0.1–1.0)
Monocytes Relative: 11 %
Neutro Abs: 1.8 10*3/uL (ref 1.7–7.7)
Neutrophils Relative %: 57 %
Platelets: 117 10*3/uL — ABNORMAL LOW (ref 150–400)
RBC: 2.92 MIL/uL — ABNORMAL LOW (ref 3.87–5.11)
RDW: 11.4 % — ABNORMAL LOW (ref 11.5–15.5)
WBC: 3.2 10*3/uL — ABNORMAL LOW (ref 4.0–10.5)
nRBC: 0 % (ref 0.0–0.2)

## 2019-06-18 LAB — IRON AND TIBC
Iron: 127 ug/dL (ref 28–170)
Saturation Ratios: 29 % (ref 10.4–31.8)
TIBC: 438 ug/dL (ref 250–450)
UIBC: 311 ug/dL

## 2019-06-18 LAB — FERRITIN: Ferritin: 201 ng/mL (ref 11–307)

## 2019-06-18 NOTE — Progress Notes (Signed)
Patient is here for follow up. She is doing well no complaints.

## 2019-06-18 NOTE — Telephone Encounter (Signed)
Called pt and let her know that because her b/p has been low twice that dr Janese Banks would like her to hold off taking lisinopril tom. Am. I am going to get in touch with Dr. Barbarann Ehlers office and let them know about the low b/p 85/54 and on 7/8 85/50. Pt is asx from the readings. I called Dr. Kary Kos office and spoke to [place] and left message to send to him and his staff. She has had 2 low readings over 2 different visits. Dr. Janese Banks says she is on 2 b/p meds and fluid pill and she was concerned that there may need to have changes to meds with b/p that low. I did tell office that dr Janese Banks had told pt to hold lisinopril tom am. Left my number for rtn call.

## 2019-06-19 ENCOUNTER — Encounter: Payer: Self-pay | Admitting: Oncology

## 2019-06-19 NOTE — Progress Notes (Signed)
Hematology/Oncology Consult note [facility]  [phone] [phone]  Patient Care Team: Maryland Pink, MD as PCP - General (Family Medicine)   Name of the patient: Tammy Arias  [id number]  24-Dec-1955   Date of visit: 06/19/19  Diagnosis-iron deficiency anemia  Chief complaint/ Reason for visit-routine follow-up of iron deficiency anemia  Heme/Onc history: patient is a 63 year old Caucasian female with a past medical history significant for congestive heart failure with an EF of 35%, history of A. 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  Recent labs from 12/20/2018 showed white count of 4.1, H&H of 7.8/25.9 with an MCV of 84.1 and a platelet count of 135. Ferritin levels were low at 11.  Iron studies showed elevated TIBC of 527 and low iron saturation of 4%.  She underwent EGD and colonoscopy. EGD showed salmon-colored mucosa suspicious for short segment Barrett's.  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  Nonbleeding erosive gastropathy.  Normal examined duodenum.  No specimens collected.Colonoscopy showed 7 nonbleeding colonic angiectasia treated with APC.  Nonbleeding internal hemorrhoids.  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    Interval history-patient reports doing well and denies any complaints at this time.  Denies any bleeding in her stool or urine or dark melanotic stools.  ECOG PS- 1 Pain scale- 0   Review of systems- Review of Systems  Constitutional: Positive for malaise/fatigue. Negative for chills, fever and weight loss.  HENT: Negative for congestion, ear discharge and nosebleeds.   Eyes: Negative for blurred vision.  Respiratory: Negative for cough, hemoptysis, sputum production, shortness of breath and wheezing.   Cardiovascular: Negative for chest pain, palpitations, orthopnea and  claudication.  Gastrointestinal: Negative for abdominal pain, blood in stool, constipation, diarrhea, heartburn, melena, nausea and vomiting.  Genitourinary: Negative for dysuria, flank pain, frequency, hematuria and urgency.  Musculoskeletal: Negative for back pain, joint pain and myalgias.  Skin: Negative for rash.  Neurological: Negative for dizziness, tingling, focal weakness, seizures, weakness and headaches.  Endo/Heme/Allergies: Does not bruise/bleed easily.  Psychiatric/Behavioral: Negative for depression and suicidal ideas. The patient does not have insomnia.        Allergies  Allergen Reactions   Vioxx [Rofecoxib] Swelling     Past Medical History:  Diagnosis Date   Anemia    Arthritis    Atrial fibrillation (HCC)    Cardiomyopathy (HCC)    CHF (congestive heart failure) (HCC)    Coronary artery disease    Diabetes mellitus without complication (HCC)    Dyspnea    with exertion   Dysrhythmia    GERD (gastroesophageal reflux disease)    Headache    migraines   Heart murmur    History of shingles 2004   Hyperlipidemia    Hypertension    Hypothyroidism    IBS (irritable bowel syndrome)    TIA (transient ischemic attack)    Vitamin B 12 deficiency      Past Surgical History:  Procedure Laterality Date   CARDIAC CATHETERIZATION     COLONOSCOPY N/A 12/19/2018   Procedure: COLONOSCOPY;  Surgeon: Virgel Manifold, MD;  Location: [facility] ENDOSCOPY;  Service: Endoscopy;  Laterality: N/A;   CORONARY ARTERY BYPASS GRAFT     ESOPHAGOGASTRODUODENOSCOPY N/A 12/19/2018   Procedure: ESOPHAGOGASTRODUODENOSCOPY (EGD);  Surgeon: Virgel Manifold, MD;  Location: [facility] ENDOSCOPY;  Service: Endoscopy;  Laterality: N/A;  EYE SURGERY Bilateral    Cataract Extraction with IOL   RIGHT/LEFT HEART CATH AND CORONARY ANGIOGRAPHY Bilateral 03/29/2017   Procedure: Right/Left Heart Cath and Coronary Angiography;  Surgeon: Teodoro Spray, MD;  Location: [facility];  Service: Cardiovascular;  Laterality: Bilateral;   TUBAL LIGATION     VENTRAL HERNIA REPAIR N/A 04/21/2017   Procedure: HERNIA REPAIR VENTRAL ADULT;  Surgeon: Leonie Green, MD;  Location: [facility] ORS;  Service: General;  Laterality: N/A;    Social History   Socioeconomic History   Marital status: Widowed    Spouse name: Not on file   Number of children: Not on file   Years of education: Not on file   Highest education level: Not on file  Occupational History   Occupation: certified nursing assistant  Social Needs   Financial resource strain: Not on file   Food insecurity    Worry: Not on file    Inability: Not on file   Transportation needs    Medical: Not on file    Non-medical: Not on file  Tobacco Use   Smoking status: Former Smoker    Types: Cigarettes    Quit date: 03/30/1999    Years since quitting: 20.2   Smokeless tobacco: Never Used  Substance and Sexual Activity   Alcohol use: No   Drug use: No   Sexual activity: Not Currently  Lifestyle   Physical activity    Days per week: Not on file    Minutes per session: Not on file   Stress: Not on file  Relationships   Social connections    Talks on phone: Not on file    Gets together: Not on file    Attends religious service: Not on file    Active member of club or organization: Not on file    Attends meetings of clubs or organizations: Not on file    Relationship status: Not on file   Intimate partner violence    Fear of current or ex partner: Not on file    Emotionally abused: Not on file    Physically abused: Not on file    Forced sexual activity: Not on file  Other Topics Concern   Not on file  Social History Narrative   Not on file    Family History  Problem Relation Age of Onset   Cancer Sister        lung   Cancer Sister        cervical   Valvular heart disease Mother    Diabetes Father    Breast cancer Neg Hx      Current Outpatient  Medications:    albuterol (PROVENTIL HFA;VENTOLIN HFA) 108 (90 Base) MCG/ACT inhaler, Inhale 2 puffs into the lungs every 6 (six) hours as needed for wheezing or shortness of breath., Disp: , Rfl:    aspirin EC 81 MG tablet, Take 81 mg by mouth daily., Disp: , Rfl:    carvedilol (COREG) 3.125 MG tablet, Take 3.125 mg by mouth 2 (two) times daily with a meal., Disp: , Rfl:    Cyanocobalamin (VITAMIN B 12 PO), Take 1 tablet by mouth daily., Disp: , Rfl:    furosemide (LASIX) 40 MG tablet, Take 80 mg by mouth daily. , Disp: , Rfl:    latanoprost (XALATAN) 0.005 % ophthalmic solution, Place 1 drop into both eyes at bedtime., Disp: , Rfl:    levothyroxine (SYNTHROID, LEVOTHROID) 75 MCG tablet, Take 75 mcg by mouth daily before  breakfast., Disp: , Rfl:    lisinopril (PRINIVIL,ZESTRIL) 5 MG tablet, Take 5 mg by mouth daily., Disp: , Rfl:    lovastatin (MEVACOR) 10 MG tablet, Take 10 mg by mouth at bedtime., Disp: , Rfl:    metFORMIN (GLUCOPHAGE) 1000 MG tablet, Take 1,000 mg by mouth 2 (two) times daily with a meal., Disp: , Rfl:    spironolactone (ALDACTONE) 25 MG tablet, Take 25 mg by mouth daily., Disp: , Rfl:    warfarin (COUMADIN) 3 MG tablet, Take 3 mg by mouth at bedtime. 3.5mg  M-W-F, 3mg  Tues, Thurs, Sat, Sun, Disp: , Rfl:    ALPRAZolam (XANAX) 1 MG tablet, Take 1 mg by mouth daily as needed for anxiety., Disp: , Rfl:    pantoprazole (PROTONIX) 40 MG tablet, Take 1 tablet (40 mg total) by mouth daily. (Patient not taking: Reported on 06/18/2019), Disp: 30 tablet, Rfl: 1  Physical exam:  Vitals:   06/18/19 1313  BP: (!) 85/54  Pulse: 68  Resp: 18  Temp: 98.8 F (37.1 C)  TempSrc: Tympanic  Weight: 140 lb 1.6 oz (63.5 kg)   Physical Exam Constitutional:      General: She is not in acute distress. HENT:     Head: Normocephalic and atraumatic.  Eyes:     Pupils: Pupils are equal, round, and reactive to light.  Neck:     Musculoskeletal: Normal range of motion.    Cardiovascular:     Rate and Rhythm: Normal rate and regular rhythm.     Heart sounds: Normal heart sounds.  Pulmonary:     Effort: Pulmonary effort is normal.     Breath sounds: Normal breath sounds.  Abdominal:     General: Bowel sounds are normal.     Palpations: Abdomen is soft.  Skin:    General: Skin is warm and dry.  Neurological:     Mental Status: She is alert and oriented to person, place, and time.      CMP Latest Ref Rng & Units 06/05/2019  Glucose 70 - 99 mg/dL -  BUN 8 - 23 mg/dL -  Creatinine 0.44 - 1.00 mg/dL -  Sodium 135 - 145 mmol/L -  Potassium 3.5 - 5.1 mmol/L -  Chloride 98 - 111 mmol/L -  CO2 22 - 32 mmol/L -  Calcium 8.9 - 10.3 mg/dL -  Total Protein 6.0 - 8.5 g/dL 6.9  Total Bilirubin 0.0 - 1.2 mg/dL <0.2  Alkaline Phos 39 - 117 IU/L 75  AST 0 - 40 IU/L 34  ALT 0 - 32 IU/L 35(H)   CBC Latest Ref Rng & Units 06/18/2019  WBC 4.0 - 10.5 K/uL 3.2(L)  Hemoglobin 12.0 - 15.0 g/dL 9.9(L)  Hematocrit 36.0 - 46.0 % 29.9(L)  Platelets 150 - 400 K/uL 117(L)    No images are attached to the encounter.  US Abdomen Limited Ruq  Result Date: 06/10/2019 CLINICAL DATA:  Hepatic cirrhosis EXAM: ULTRASOUND ABDOMEN LIMITED RIGHT UPPER QUADRANT COMPARISON:  CT abdomen and pelvis December 17, 2018 FINDINGS: Gallbladder: Within the gallbladder, there is an echogenic focus which moves and shadows, measuring 1.6 cm in size, consistent with cholelithiasis. There is no gallbladder wall thickening or pericholecystic fluid. No sonographic Murphy sign noted by sonographer. Common bile duct: Diameter: 2 mm. No intrahepatic or extrahepatic biliary duct dilatation. Liver: No focal lesion identified. The liver has a somewhat nodular contour with a mildly coarsened echotexture. Portal vein is patent on color Doppler imaging with normal direction of blood flow towards  the liver. IMPRESSION: 1. Liver has an appearance indicative of known cirrhosis. No focal liver lesions are evident on  this study. It should be noted that the sensitivity of ultrasound for focal liver lesions is somewhat diminished in this circumstance. 2. Cholelithiasis. No gallbladder wall thickening or pericholecystic fluid. Electronically Signed   By: Lowella Grip III M.D.   On: 06/10/2019 09:12     Assessment and plan- Patient is a 63 y.o. female with following issues:  1.  Iron deficiency anemia:Patient does have baseline anemia and her hemoglobin is between 9-10 likely secondary to anemia of chronic disease.  In the past when she was iron deficient her hemoglobin had dropped down to 7.8 which improved after receiving IV iron.  And currently her hemoglobin is at her baseline.  She has previously had a complete anemia work-up which did not reveal any other cause of anemia.  Today her iron studies are within normal limits and she does not require IV iron.  Repeat CBC ferritin and iron studies in 3 in 6 months and I will see her back in 6 months.  I will also check a B12 level in 3 months.  2. Mild thrombocytopenia: Patient does have baseline thrombocytopenia which we will continue to monitor   Visit Diagnosis 1. Iron deficiency anemia due to chronic blood loss      Dr. Randa Evens, MD, MPH [facility] [phone] 06/19/2019 8:43 AM

## 2019-07-08 ENCOUNTER — Ambulatory Visit (INDEPENDENT_AMBULATORY_CARE_PROVIDER_SITE_OTHER): Payer: Self-pay

## 2019-07-08 ENCOUNTER — Encounter (INDEPENDENT_AMBULATORY_CARE_PROVIDER_SITE_OTHER): Payer: Self-pay

## 2019-07-08 ENCOUNTER — Other Ambulatory Visit: Payer: Self-pay

## 2019-07-08 DIAGNOSIS — Z23 Encounter for immunization: Secondary | ICD-10-CM

## 2019-09-11 ENCOUNTER — Other Ambulatory Visit: Payer: Self-pay

## 2019-09-12 ENCOUNTER — Ambulatory Visit: Payer: Self-pay | Admitting: Gastroenterology

## 2019-09-12 ENCOUNTER — Other Ambulatory Visit: Payer: Self-pay

## 2019-09-12 ENCOUNTER — Encounter (INDEPENDENT_AMBULATORY_CARE_PROVIDER_SITE_OTHER): Payer: Self-pay

## 2019-09-12 ENCOUNTER — Encounter: Payer: Self-pay | Admitting: Gastroenterology

## 2019-09-12 VITALS — BP 103/68 | HR 98 | Temp 98.3°F | Wt 148.4 lb

## 2019-09-12 DIAGNOSIS — R748 Abnormal levels of other serum enzymes: Secondary | ICD-10-CM

## 2019-09-12 NOTE — Progress Notes (Signed)
Vonda Antigua, MD [address]  [place]  [place], [place] [address]  Main: [phone]  Fax: [phone]   Primary Care Physician: Maryland Pink, MD   Chief Complaint  Patient presents with  . Anemia    Patient is having no symptoms   . Cirrhosis    HPI: Tammy Arias is a 63 y.o. 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  Anemia continues to improve with iron replacement by hematology  The patient denies abdominal or flank pain, anorexia, nausea or vomiting, dysphagia, change in bowel habits or black or bloody stools or weight loss.  EGD, December 19, 2018 showed salmon-colored mucosa at the GE junction which was not biopsied due to anemia at the time. 5 nonbleeding angiectasia seen in the stomach treated with APC. Gastric erosions were reported. Otherwise normal study with no active bleeding.  Colonoscopy December 19, 2018 showed a fair prep, 7 nonbleeding angiectasia is noted treated with APC. Nonbleeding internal hemorrhoids. No active bleeding seen.  Patient also was found to have cirrhosis on CT abdomen pelvis and this is thought to be due to her CHF and she is following cardiology for this.  Viral and autoimmune hepatitis work-up has been negative.  Liver enzymes have been normal.  Ultrasound in July 2020 - for any lesions  Current Outpatient Medications  Medication Sig Dispense Refill  . albuterol (PROVENTIL HFA;VENTOLIN HFA) 108 (90 Base) MCG/ACT inhaler Inhale 2 puffs into the lungs every 6 (six) hours as needed for wheezing or shortness of breath.    . ALPRAZolam (XANAX) 1 MG tablet Take 1 mg by mouth daily as needed for anxiety.    Marland Kitchen aspirin EC 81 MG tablet Take 81 mg by mouth daily.    . carvedilol (COREG) 3.125 MG tablet Take 3.125 mg by mouth 2 (two) times daily with a meal.    . Cyanocobalamin (VITAMIN B 12 PO) Take 1 tablet by mouth daily.    . cyclobenzaprine (FLEXERIL) 5 MG tablet  Take by mouth.    . fluticasone (FLONASE) 50 MCG/ACT nasal spray Place into the nose.    . furosemide (LASIX) 40 MG tablet Take 80 mg by mouth daily.     Marland Kitchen latanoprost (XALATAN) 0.005 % ophthalmic solution Place 1 drop into both eyes at bedtime.    Marland Kitchen levothyroxine (SYNTHROID, LEVOTHROID) 75 MCG tablet Take 75 mcg by mouth daily before breakfast.    . lisinopril (PRINIVIL,ZESTRIL) 5 MG tablet Take 5 mg by mouth daily.    Marland Kitchen lovastatin (MEVACOR) 10 MG tablet Take 10 mg by mouth at bedtime.    . metFORMIN (GLUCOPHAGE) 1000 MG tablet Take 1,000 mg by mouth 2 (two) times daily with a meal.    . spironolactone (ALDACTONE) 25 MG tablet Take 25 mg by mouth daily.    Marland Kitchen warfarin (COUMADIN) 3 MG tablet Take 3 mg by mouth at bedtime. 3.5mg  M-W-F, 3mg  Tues, Thurs, Sat, Sun     No current facility-administered medications for this visit.     Allergies as of 09/12/2019 - Review Complete 09/12/2019  Allergen Reaction Noted  . Vioxx [rofecoxib] Swelling 03/24/2017    ROS:  General: Negative for anorexia, weight loss, fever, chills, fatigue, weakness. ENT: Negative for hoarseness, difficulty swallowing , nasal congestion. CV: Negative for chest pain, angina, palpitations, dyspnea on exertion, peripheral edema.  Respiratory: Negative for dyspnea at rest, dyspnea on exertion, cough, sputum, wheezing.  GI: See history of present  illness. GU:  Negative for dysuria, hematuria, urinary incontinence, urinary frequency, nocturnal urination.  Endo: Negative for unusual weight change.    Physical Examination:   BP 103/68 (BP Location: Left Arm, Patient Position: Sitting, Cuff Size: Normal)   Pulse 98   Temp 98.3 F (36.8 C) (Oral)   Wt 148 lb 6 oz (67.3 kg)   BMI 27.14 kg/m   General: Well-nourished, well-developed in no acute distress.  Eyes: No icterus. Conjunctivae pink. Mouth: Oropharyngeal mucosa moist and pink , no lesions erythema or exudate. Neck: Supple, Trachea midline Abdomen: Bowel sounds  are normal, nontender, nondistended, no hepatosplenomegaly or masses, no abdominal bruits or hernia , no rebound or guarding.   Extremities: No lower extremity edema. No clubbing or deformities. Neuro: Alert and oriented x 3.  Grossly intact. Skin: Warm and dry, no jaundice.   Psych: Alert and cooperative, normal mood and affect.   Labs: CMP     Component Value Date/Time   NA 137 01/08/2019 1025   K 3.9 01/08/2019 1025   CL 96 (L) 01/08/2019 1025   CO2 29 01/08/2019 1025   GLUCOSE 97 01/08/2019 1025   BUN 21 01/08/2019 1025   CREATININE 0.98 01/08/2019 1025   CALCIUM 9.8 01/08/2019 1025   PROT 6.9 06/05/2019 1457   ALBUMIN 4.7 06/05/2019 1457   AST 34 06/05/2019 1457   ALT 35 (H) 06/05/2019 1457   ALKPHOS 75 06/05/2019 1457   BILITOT <0.2 06/05/2019 1457   GFRNONAA >60 01/08/2019 1025   GFRAA >60 01/08/2019 1025   Lab Results  Component Value Date   WBC 3.2 (L) 06/18/2019   HGB 9.9 (L) 06/18/2019   HCT 29.9 (L) 06/18/2019   MCV 102.4 (H) 06/18/2019   PLT 117 (L) 06/18/2019    Imaging Studies: No results found.  Assessment and Plan:   Tammy Arias is a 63 y.o. 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 Janese Banks  improvement with iron replacement  Continue follow-up with cardiology optimization for CHF [facility] screening up-to-date, with normal AFP levels and ultrasound in July 2020  We discussed need for EGD for biopsies of salmon-colored mucosa previously seen and not biopsied, and colonoscopy due to fair prep on last procedure.  Patient would like these done in March instead of January likely had previously discussed  Follow-up in clinic around March to schedule procedures at that time  Repeat liver enzymes at this time as one of her transaminases was mildly elevated to the 30s on last visit  Dr Vonda Antigua

## 2019-09-13 LAB — HEPATIC FUNCTION PANEL
ALT: 31 IU/L (ref 0–32)
AST: 40 IU/L (ref 0–40)
Albumin: 5 g/dL — ABNORMAL HIGH (ref 3.8–4.8)
Alkaline Phosphatase: 99 IU/L (ref 39–117)
Bilirubin Total: 0.2 mg/dL (ref 0.0–1.2)
Bilirubin, Direct: 0.1 mg/dL (ref 0.00–0.40)
Total Protein: 7.4 g/dL (ref 6.0–8.5)

## 2019-09-13 LAB — PROTIME-INR
INR: 1.6 — ABNORMAL HIGH (ref 0.9–1.2)
Prothrombin Time: 16.7 s — ABNORMAL HIGH (ref 9.1–12.0)

## 2019-09-18 ENCOUNTER — Other Ambulatory Visit: Payer: Self-pay

## 2019-09-18 ENCOUNTER — Inpatient Hospital Stay: Payer: Self-pay | Attending: Oncology

## 2019-09-18 DIAGNOSIS — E538 Deficiency of other specified B group vitamins: Secondary | ICD-10-CM | POA: Insufficient documentation

## 2019-09-18 DIAGNOSIS — D500 Iron deficiency anemia secondary to blood loss (chronic): Secondary | ICD-10-CM | POA: Insufficient documentation

## 2019-09-18 LAB — CBC WITH DIFFERENTIAL/PLATELET
Abs Immature Granulocytes: 0.02 10*3/uL (ref 0.00–0.07)
Basophils Absolute: 0 10*3/uL (ref 0.0–0.1)
Basophils Relative: 1 %
Eosinophils Absolute: 0.3 10*3/uL (ref 0.0–0.5)
Eosinophils Relative: 10 %
HCT: 33.1 % — ABNORMAL LOW (ref 36.0–46.0)
Hemoglobin: 10.9 g/dL — ABNORMAL LOW (ref 12.0–15.0)
Immature Granulocytes: 1 %
Lymphocytes Relative: 16 %
Lymphs Abs: 0.5 10*3/uL — ABNORMAL LOW (ref 0.7–4.0)
MCH: 32.8 pg (ref 26.0–34.0)
MCHC: 32.9 g/dL (ref 30.0–36.0)
MCV: 99.7 fL (ref 80.0–100.0)
Monocytes Absolute: 0.4 10*3/uL (ref 0.1–1.0)
Monocytes Relative: 12 %
Neutro Abs: 1.8 10*3/uL (ref 1.7–7.7)
Neutrophils Relative %: 60 %
Platelets: 103 10*3/uL — ABNORMAL LOW (ref 150–400)
RBC: 3.32 MIL/uL — ABNORMAL LOW (ref 3.87–5.11)
RDW: 11.9 % (ref 11.5–15.5)
WBC: 3 10*3/uL — ABNORMAL LOW (ref 4.0–10.5)
nRBC: 0 % (ref 0.0–0.2)

## 2019-09-18 LAB — IRON AND TIBC
Iron: 96 ug/dL (ref 28–170)
Saturation Ratios: 24 % (ref 10.4–31.8)
TIBC: 398 ug/dL (ref 250–450)
UIBC: 302 ug/dL

## 2019-09-18 LAB — FERRITIN: Ferritin: 151 ng/mL (ref 11–307)

## 2019-09-18 LAB — VITAMIN B12: Vitamin B-12: 681 pg/mL (ref 180–914)

## 2019-09-23 ENCOUNTER — Telehealth: Payer: Self-pay | Admitting: Gastroenterology

## 2019-09-23 NOTE — Telephone Encounter (Signed)
Called and left a message for call back  

## 2019-09-23 NOTE — Telephone Encounter (Signed)
Please advised of lab results

## 2019-09-23 NOTE — Telephone Encounter (Signed)
Pt is  Regarding her labs she had done 09/12/19

## 2019-09-24 ENCOUNTER — Telehealth: Payer: Self-pay | Admitting: Gastroenterology

## 2019-09-24 NOTE — Telephone Encounter (Signed)
Pt left vm returning your call 

## 2019-09-24 NOTE — Telephone Encounter (Signed)
Patient verbalized understanding. Patient states she has appointment with the INR clinic on 10/01/2019

## 2019-10-07 ENCOUNTER — Telehealth: Payer: Self-pay | Admitting: *Deleted

## 2019-10-07 NOTE — Telephone Encounter (Signed)
Contacted pt and got her voicemail and let her know that dr. Janese Banks would like to have video visit one day this week to go over results. Wants to see if she has a smart phone capable of doing video visit and it that is ok with her. I have left my direct number to call me back

## 2019-10-07 NOTE — Telephone Encounter (Signed)
Would she be willing to do a video visit sometime this week?

## 2019-10-07 NOTE — Telephone Encounter (Signed)
Patient called asking for lab results  Vitamin B12 Order: [id number] Status:  Final result Visible to patient:  No (not released) Next appt:  12/19/2019 at 01:45 PM in Oncology ([facility]) Dx:  Iron deficiency anemia due to chronic...  Ref Range & Units 2wk ago 61mo ago  Vitamin B-12 180 - 914 pg/mL 681  958High  CM   Comment: (NOTE)  This assay is not validated for testing neonatal or  myeloproliferative syndrome specimens for Vitamin B12 levels.  Performed at [facility], [place] [address], [place], [place]  [address]   Resulting Agency  [facility] CL[facility] LAB [facility]      Specimen Collected: 09/18/19 12:58 Last Resulted: 09/18/19 20:46     Lab Flowsheet   Order Details   View Encounter   Lab and Collection Details   Routing   Result History     CM=Additional comments      Other Results from 09/18/2019  Ferritin Order: [id number]  Status:  Final result Visible to patient:  No (not released) Next appt:  12/19/2019 at 01:45 PM in Oncology ([facility]) Dx:  Iron deficiency anemia due to chronic...  Ref Range & Units 2wk ago 66mo ago 52mo ago  Ferritin 11 - 307 ng/mL 151  201 CM  30 CM   Comment: Performed at [facility], [address] [place], [place] [address]  Resulting Agency  [facility] CLIN LAB [facility] CLIN LAB [facility] LAB      Specimen Collected: 09/18/19 12:58 Last Resulted: 09/18/19 15:22     Lab Flowsheet   Order Details   View Encounter   Lab and Collection Details   Routing   Result History     CM=Additional comments        Iron and TIBC Order: [id number]  Status:  Final result Visible to patient:  No (not released) Next appt:  12/19/2019 at 01:45 PM in Oncology ([facility]) Dx:  Iron deficiency anemia due to chronic...  Ref Range & Units 2wk ago 65mo ago 70mo ago  Iron 28 - 170 ug/dL 96  127  89   TIBC 250 - 450 ug/dL 398  438  480High    Saturation Ratios 10.4 - 31.8 % 24  29  19    UIBC ug/dL 302  311 CM  391 CM   Comment:  Performed at [facility], [address] [place], [place] [address]  Resulting Agency  [facility] CLIN LAB [facility] CL[facility] LAB [facility] LAB      Specimen Collected: 09/18/19 12:58 Last Resulted: 09/18/19 15:22     Lab Flowsheet   Order Details   View Encounter   Lab and Collection Details   Routing   Result History     CM=Additional comments        Contains abnormal data CBC with Differential Order: [id number]  Status:  Final result Visible to patient:  No (not released) Next appt:  12/19/2019 at 01:45 PM in Oncology ([facility]) Dx:  Iron deficiency anemia due to chronic...  Ref Range & Units 2wk ago 70mo ago 50mo ago  WBC 4.0 - 10.5 K/uL 3.0Low   3.2Low   5.1   RBC 3.87 - 5.11 MIL/uL 3.32Low   2.92Low   4.17   Hemoglobin 12.0 - 15.0 g/dL 10.9Low   9.9Low   10.8Low    HCT 36.0 - 46.0 % 33.1Low   29.9Low   35.5Low    MCV 80.0 - 100.0 fL 99.7  102.4High  85.1   MCH 26.0 - 34.0 pg 32.8  33.9  25.9Low    MCHC 30.0 - 36.0 g/dL 32.9  33.1  30.4   RDW 11.5 - 15.5 % 11.9  11.4Low   19.7High    Platelets 150 - 400 K/uL 103Low   117Low   172   nRBC 0.0 - 0.2 % 0.0  0.0  0.0   Neutrophils Relative % % 60  57  73   Neutro Abs 1.7 - 7.7 K/uL 1.8  1.8  3.7   Lymphocytes Relative % 16  16  11    Lymphs Abs 0.7 - 4.0 K/uL 0.5Low   0.5Low   0.6Low    Monocytes Relative % 12  11  8    Monocytes Absolute 0.1 - 1.0 K/uL 0.4  0.4  0.4   Eosinophils Relative % 10  16  8    Eosinophils Absolute 0.0 - 0.5 K/uL 0.3  0.5  0.4   Basophils Relative % 1  0  0   Basophils Absolute 0.0 - 0.1 K/uL 0.0  0.0  0.0   Immature Granulocytes % 1  0  0   Abs Immature Granulocytes 0.00 - 0.07 K/uL 0.02  0.01 CM  0.02 CM   Comment: Performed at [facility], [address] [place], [place] [address]  Resulting Agency  [facility] CLIN LAB [facility] CLIN LAB [facility] CLIN LAB      Specimen Collected: 09/18/19 12:58 Last Resulted: 09/18/19 13:10

## 2019-10-07 NOTE — Telephone Encounter (Signed)
Patient states she does not do the video visits, but she is willing to do a telephone visit with you

## 2019-10-07 NOTE — Telephone Encounter (Signed)
Would prefer in person visit in that case

## 2019-10-08 NOTE — Telephone Encounter (Signed)
Called pt and let her know that dr Janese Banks will call the pt directly today with results. Pt is agreeable

## 2019-12-18 ENCOUNTER — Other Ambulatory Visit: Payer: Self-pay

## 2019-12-18 ENCOUNTER — Encounter: Payer: Self-pay | Admitting: Oncology

## 2019-12-18 NOTE — Progress Notes (Signed)
Patient stated that she had been gaining weight. Patient denied any blood loss.

## 2019-12-19 ENCOUNTER — Inpatient Hospital Stay (HOSPITAL_BASED_OUTPATIENT_CLINIC_OR_DEPARTMENT_OTHER): Payer: Self-pay | Admitting: Oncology

## 2019-12-19 ENCOUNTER — Inpatient Hospital Stay: Payer: Medicaid Other | Attending: Oncology

## 2019-12-19 ENCOUNTER — Encounter: Payer: Self-pay | Admitting: Oncology

## 2019-12-19 ENCOUNTER — Other Ambulatory Visit: Payer: Self-pay

## 2019-12-19 VITALS — BP 115/72 | HR 87 | Temp 98.7°F | Ht 62.0 in | Wt 158.0 lb

## 2019-12-19 DIAGNOSIS — Z7984 Long term (current) use of oral hypoglycemic drugs: Secondary | ICD-10-CM | POA: Insufficient documentation

## 2019-12-19 DIAGNOSIS — I2510 Atherosclerotic heart disease of native coronary artery without angina pectoris: Secondary | ICD-10-CM | POA: Insufficient documentation

## 2019-12-19 DIAGNOSIS — D500 Iron deficiency anemia secondary to blood loss (chronic): Secondary | ICD-10-CM

## 2019-12-19 DIAGNOSIS — E119 Type 2 diabetes mellitus without complications: Secondary | ICD-10-CM | POA: Insufficient documentation

## 2019-12-19 DIAGNOSIS — D696 Thrombocytopenia, unspecified: Secondary | ICD-10-CM | POA: Insufficient documentation

## 2019-12-19 DIAGNOSIS — I4891 Unspecified atrial fibrillation: Secondary | ICD-10-CM | POA: Insufficient documentation

## 2019-12-19 DIAGNOSIS — Z79899 Other long term (current) drug therapy: Secondary | ICD-10-CM | POA: Insufficient documentation

## 2019-12-19 DIAGNOSIS — D649 Anemia, unspecified: Secondary | ICD-10-CM

## 2019-12-19 LAB — CBC WITH DIFFERENTIAL/PLATELET
Abs Immature Granulocytes: 0.01 10*3/uL (ref 0.00–0.07)
Basophils Absolute: 0 10*3/uL (ref 0.0–0.1)
Basophils Relative: 1 %
Eosinophils Absolute: 0.4 10*3/uL (ref 0.0–0.5)
Eosinophils Relative: 9 %
HCT: 33.8 % — ABNORMAL LOW (ref 36.0–46.0)
Hemoglobin: 11.2 g/dL — ABNORMAL LOW (ref 12.0–15.0)
Immature Granulocytes: 0 %
Lymphocytes Relative: 14 %
Lymphs Abs: 0.6 10*3/uL — ABNORMAL LOW (ref 0.7–4.0)
MCH: 33.7 pg (ref 26.0–34.0)
MCHC: 33.1 g/dL (ref 30.0–36.0)
MCV: 101.8 fL — ABNORMAL HIGH (ref 80.0–100.0)
Monocytes Absolute: 0.4 10*3/uL (ref 0.1–1.0)
Monocytes Relative: 11 %
Neutro Abs: 2.7 10*3/uL (ref 1.7–7.7)
Neutrophils Relative %: 65 %
Platelets: 122 10*3/uL — ABNORMAL LOW (ref 150–400)
RBC: 3.32 MIL/uL — ABNORMAL LOW (ref 3.87–5.11)
RDW: 12.5 % (ref 11.5–15.5)
WBC: 4.1 10*3/uL (ref 4.0–10.5)
nRBC: 0 % (ref 0.0–0.2)

## 2019-12-19 LAB — IRON AND TIBC
Iron: 97 ug/dL (ref 28–170)
Saturation Ratios: 21 % (ref 10.4–31.8)
TIBC: 465 ug/dL — ABNORMAL HIGH (ref 250–450)
UIBC: 368 ug/dL

## 2019-12-19 LAB — FERRITIN: Ferritin: 49 ng/mL (ref 11–307)

## 2019-12-22 NOTE — Progress Notes (Signed)
Hematology/Oncology Consult note [facility]  [phone] [phone]  Patient Care Team: Maryland Pink, MD as PCP - General (Family Medicine) Sindy Guadeloupe, MD as Consulting Physician (Hematology and Oncology)   Name of the patient: Tammy Arias  [id number]  Jul 19, 1956   Date of visit: 12/22/19  Diagnosis- iron deficiency anemia  Chief complaint/ Reason for visit- routine f/u of iron deficiency anemia  Heme/Onc history: patient is a 64 year old Caucasian female with a past medical history significant for congestive heart failure with an EF of 35%, history of A. 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 Recent labs from 12/20/2018 showed white count of 4.1, H&H of 7.8/25.9 with an MCV of 84.1 and a platelet count of 135. Ferritin levels were low at 11. Iron studies showed elevated TIBC of 527 and low iron saturation of 4%. She underwent EGD and colonoscopy. EGD showed salmon-colored mucosa suspicious for short segment Barrett's. 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 Nonbleeding erosive gastropathy. Normal examined duodenum. No specimens collected.Colonoscopy showed 7 nonbleeding colonic angiectasia treated with APC. Nonbleeding internal hemorrhoids. Cause of her anemia attributed to multiple AVMs in her stomach and colon  Interval history- she feels well overall. Denies any complaints at this time. Denies any blood in stool or urine  ECOG PS- 1 Pain scale- 0   Review of systems- Review of Systems  Constitutional: Negative for chills, fever, malaise/fatigue and weight loss.  HENT: Negative for congestion, ear discharge and nosebleeds.   Eyes: Negative for blurred vision.  Respiratory: Negative for cough, hemoptysis, sputum production, shortness of breath and wheezing.   Cardiovascular: Negative for chest pain, palpitations,  orthopnea and claudication.  Gastrointestinal: Negative for abdominal pain, blood in stool, constipation, diarrhea, heartburn, melena, nausea and vomiting.  Genitourinary: Negative for dysuria, flank pain, frequency, hematuria and urgency.  Musculoskeletal: Negative for back pain, joint pain and myalgias.  Skin: Negative for rash.  Neurological: Negative for dizziness, tingling, focal weakness, seizures, weakness and headaches.  Endo/Heme/Allergies: Does not bruise/bleed easily.  Psychiatric/Behavioral: Negative for depression and suicidal ideas. The patient does not have insomnia.       Allergies  Allergen Reactions  . Vioxx [Rofecoxib] Swelling     Past Medical History:  Diagnosis Date  . Anemia   . Arthritis   . Atrial fibrillation ([place])   . Cardiomyopathy ([place])   . CHF (congestive heart failure) ([place])   . Coronary artery disease   . Diabetes mellitus without complication ([place])   . Dyspnea    with exertion  . Dysrhythmia   . GERD (gastroesophageal reflux disease)   . Headache    migraines  . Heart murmur   . History of shingles 2004  . Hyperlipidemia   . Hypertension   . Hypothyroidism   . IBS (irritable bowel syndrome)   . TIA (transient ischemic attack)   . Vitamin B 12 deficiency      Past Surgical History:  Procedure Laterality Date  . CARDIAC CATHETERIZATION    . COLONOSCOPY N/A 12/19/2018   Procedure: COLONOSCOPY;  Surgeon: Virgel Manifold, MD;  Location: [facility] ENDOSCOPY;  Service: Endoscopy;  Laterality: N/A;  . CORONARY ARTERY BYPASS GRAFT    . ESOPHAGOGASTRODUODENOSCOPY N/A 12/19/2018   Procedure: ESOPHAGOGASTRODUODENOSCOPY (EGD);  Surgeon: Virgel Manifold, MD;  Location: [facility] ENDOSCOPY;  Service: Endoscopy;  Laterality: N/A;  . EYE SURGERY Bilateral  Cataract Extraction with IOL  . RIGHT/LEFT HEART CATH AND CORONARY ANGIOGRAPHY Bilateral 03/29/2017   Procedure: Right/Left Heart Cath and Coronary Angiography;  Surgeon: Teodoro Spray, MD;   Location: [place] CV LAB;  Service: Cardiovascular;  Laterality: Bilateral;  . TUBAL LIGATION    . VENTRAL HERNIA REPAIR N/A 04/21/2017   Procedure: HERNIA REPAIR VENTRAL ADULT;  Surgeon: Leonie Green, MD;  Location: [facility] ORS;  Service: General;  Laterality: N/A;    Social History   Socioeconomic History  . Marital status: Widowed    Spouse name: Not on file  . Number of children: Not on file  . Years of education: Not on file  . Highest education level: Not on file  Occupational History  . Occupation: Automotive engineer  Tobacco Use  . Smoking status: Former Smoker    Types: Cigarettes    Quit date: 03/30/1999    Years since quitting: 20.7  . Smokeless tobacco: Never Used  Substance and Sexual Activity  . Alcohol use: No  . Drug use: No  . Sexual activity: Not Currently  Other Topics Concern  . Not on file  Social History Narrative  . Not on file   Social Determinants of Health   Financial Resource Strain:   . Difficulty of Paying Living Expenses: Not on file  Food Insecurity:   . Worried About Charity fundraiser in the Last Year: Not on file  . Ran Out of Food in the Last Year: Not on file  Transportation Needs:   . Lack of Transportation (Medical): Not on file  . Lack of Transportation (Non-Medical): Not on file  Physical Activity:   . Days of Exercise per Week: Not on file  . Minutes of Exercise per Session: Not on file  Stress:   . Feeling of Stress : Not on file  Social Connections:   . Frequency of Communication with Friends and Family: Not on file  . Frequency of Social Gatherings with Friends and Family: Not on file  . Attends Religious Services: Not on file  . Active Member of Clubs or Organizations: Not on file  . Attends Archivist Meetings: Not on file  . Marital Status: Not on file  Intimate Partner Violence:   . Fear of Current or Ex-Partner: Not on file  . Emotionally Abused: Not on file  . Physically Abused: Not  on file  . Sexually Abused: Not on file    Family History  Problem Relation Age of Onset  . Cancer Sister        lung  . Cancer Sister        cervical  . Valvular heart disease Mother   . Diabetes Father   . Breast cancer Neg Hx      Current Outpatient Medications:  .  aspirin EC 81 MG tablet, Take 81 mg by mouth daily., Disp: , Rfl:  .  carvedilol (COREG) 3.125 MG tablet, Take 3.125 mg by mouth 2 (two) times daily with a meal., Disp: , Rfl:  .  Cyanocobalamin (VITAMIN B 12 PO), Take 1 tablet by mouth daily., Disp: , Rfl:  .  fluticasone (FLONASE) 50 MCG/ACT nasal spray, Place into the nose., Disp: , Rfl:  .  furosemide (LASIX) 40 MG tablet, Take 80 mg by mouth daily. , Disp: , Rfl:  .  latanoprost (XALATAN) 0.005 % ophthalmic solution, Place 1 drop into both eyes at bedtime., Disp: , Rfl:  .  levothyroxine (SYNTHROID, LEVOTHROID) 75 MCG tablet,  Take 75 mcg by mouth daily before breakfast., Disp: , Rfl:  .  lisinopril (PRINIVIL,ZESTRIL) 5 MG tablet, Take 5 mg by mouth daily., Disp: , Rfl:  .  lovastatin (MEVACOR) 10 MG tablet, Take 10 mg by mouth at bedtime., Disp: , Rfl:  .  metFORMIN (GLUCOPHAGE) 1000 MG tablet, Take 1,000 mg by mouth 2 (two) times daily with a meal., Disp: , Rfl:  .  spironolactone (ALDACTONE) 25 MG tablet, Take 25 mg by mouth daily., Disp: , Rfl:  .  warfarin (COUMADIN) 3 MG tablet, Take 3 mg by mouth at bedtime. 3.5mg  M-W-F, 3mg  Tues, Thurs, Sat, Sun, Disp: , Rfl:  .  albuterol (PROVENTIL HFA;VENTOLIN HFA) 108 (90 Base) MCG/ACT inhaler, Inhale 2 puffs into the lungs every 6 (six) hours as needed for wheezing or shortness of breath., Disp: , Rfl:  .  ALPRAZolam (XANAX) 1 MG tablet, Take 1 mg by mouth daily as needed for anxiety., Disp: , Rfl:  .  cyclobenzaprine (FLEXERIL) 5 MG tablet, Take by mouth., Disp: , Rfl:   Physical exam:  Vitals:   12/19/19 1418  BP: 115/72  Pulse: 87  Temp: 98.7 F (37.1 C)  TempSrc: Tympanic  Weight: 158 lb (71.7 kg)  Height:  5\' 2"  (1.575 m)   Physical Exam HENT:     Head: Normocephalic and atraumatic.  Eyes:     Pupils: Pupils are equal, round, and reactive to light.  Cardiovascular:     Rate and Rhythm: Normal rate and regular rhythm.     Heart sounds: Normal heart sounds.  Pulmonary:     Effort: Pulmonary effort is normal.     Breath sounds: Normal breath sounds.  Abdominal:     General: Bowel sounds are normal.     Palpations: Abdomen is soft.  Musculoskeletal:     Cervical back: Normal range of motion.  Skin:    General: Skin is warm and dry.  Neurological:     Mental Status: She is alert and oriented to person, place, and time.      CMP Latest Ref Rng & Units 09/12/2019  Glucose 70 - 99 mg/dL -  BUN 8 - 23 mg/dL -  Creatinine 0.44 - 1.00 mg/dL -  Sodium 135 - 145 mmol/L -  Potassium 3.5 - 5.1 mmol/L -  Chloride 98 - 111 mmol/L -  CO2 22 - 32 mmol/L -  Calcium 8.9 - 10.3 mg/dL -  Total Protein 6.0 - 8.5 g/dL 7.4  Total Bilirubin 0.0 - 1.2 mg/dL 0.2  Alkaline Phos 39 - 117 IU/L 99  AST 0 - 40 IU/L 40  ALT 0 - 32 IU/L 31   CBC Latest Ref Rng & Units 12/19/2019  WBC 4.0 - 10.5 K/uL 4.1  Hemoglobin 12.0 - 15.0 g/dL 11.2(L)  Hematocrit 36.0 - 46.0 % 33.8(L)  Platelets 150 - 400 K/uL 122(L)      Assessment and plan- Patient is a 64 y.o. female with iron deficiency anemia and thrombocytopenia here for routine f/u  Iron deficiency anemia- ferritin is near normal at 49. Mildly elevated TIBC. Mild anemia with hb of 11.2. no need for IV iron at this time. Continue to monitor  Thrombocytopenia- stable and mild.  I will see her back in 6 months with cbc ferritin and iron studies and b12 followed by video visit    Visit Diagnosis 1. Thrombocytopenia ([place])   2. Iron deficiency anemia due to chronic blood loss   3. Normocytic anemia  Dr. Randa Evens, MD, MPH [facility] [id number] 12/22/2019 6:21 PM

## 2020-02-13 IMAGING — MG MM DIGITAL SCREENING BILAT W/ TOMO W/ CAD
8 series · 8 of 24 positions shown · non-contrast
Comparison: Previous exam(s).

CLINICAL DATA: Screening.

EXAM:
DIGITAL SCREENING BILATERAL MAMMOGRAM WITH TOMO AND CAD

[L CC synth-2D]
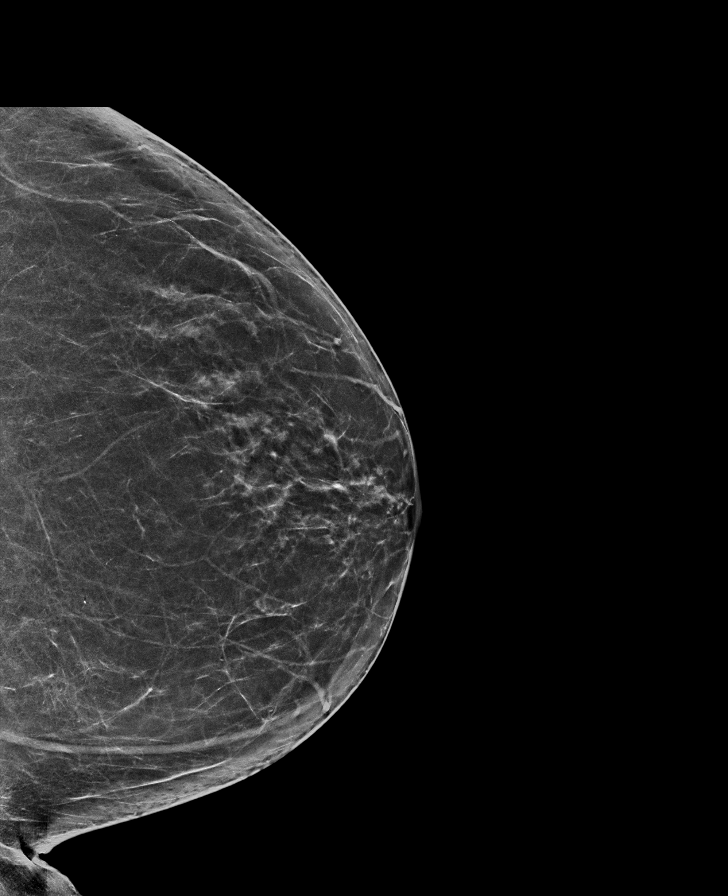

[R MLO synth-2D]
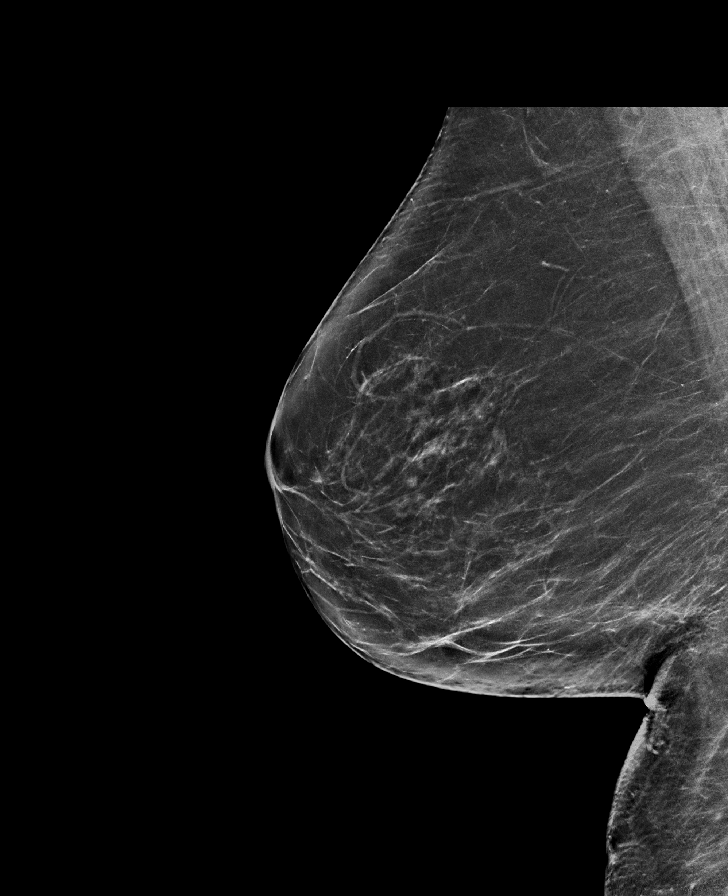

[L MLO synth-2D]
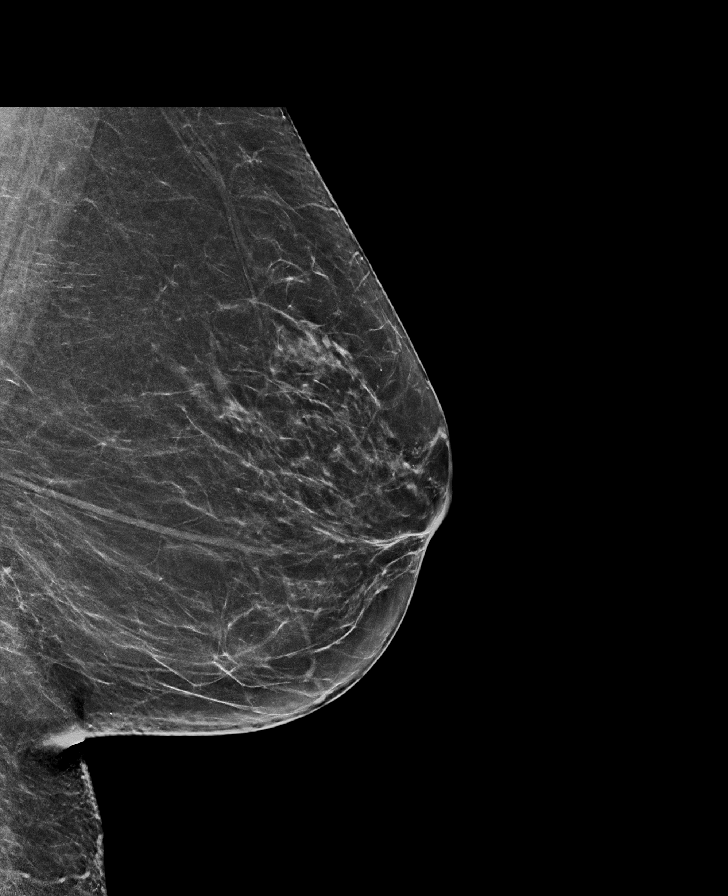

[R CC synth-2D]
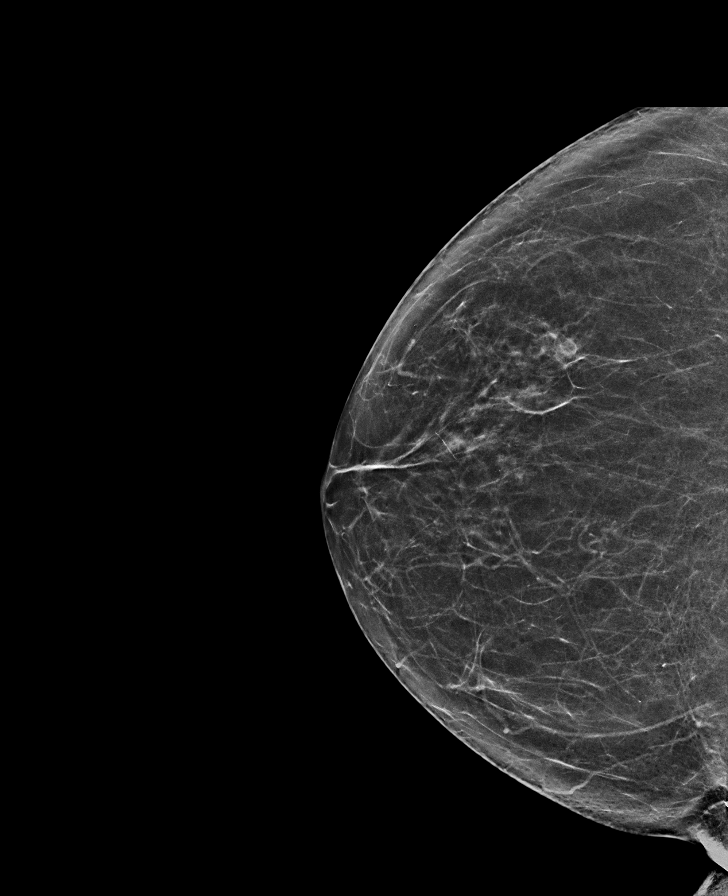

[R CC tomo · tomo slice 31/62.0]
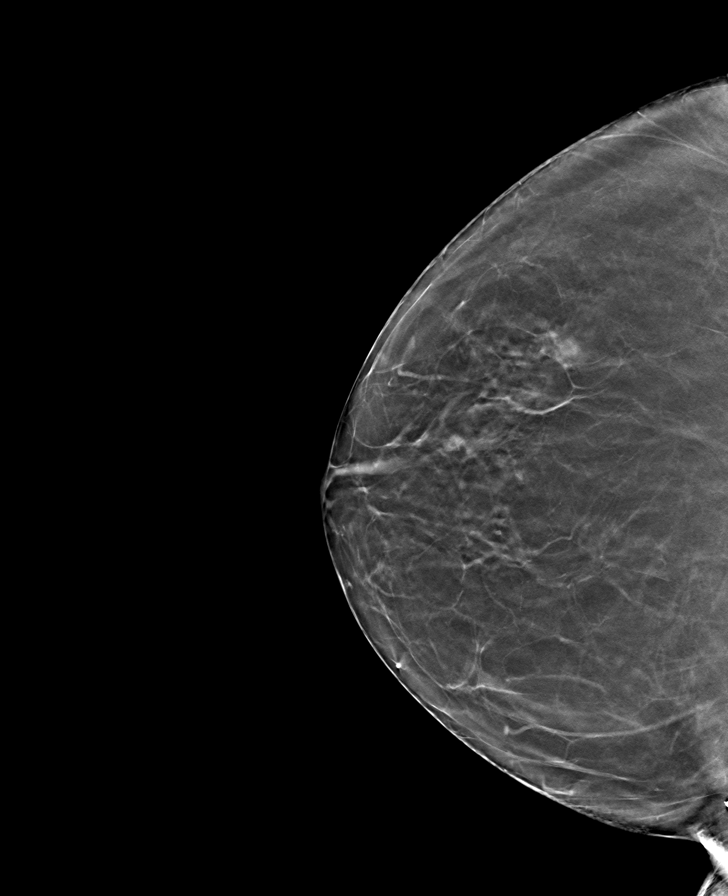

[L CC tomo · tomo slice 35/68.0]
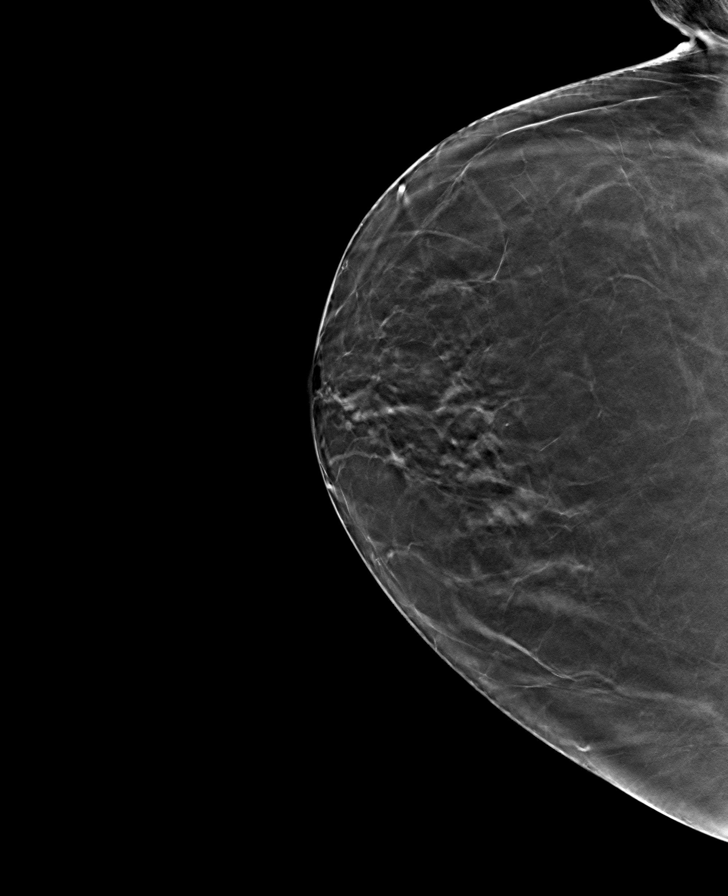

[R MLO tomo · tomo slice 37/72.0]
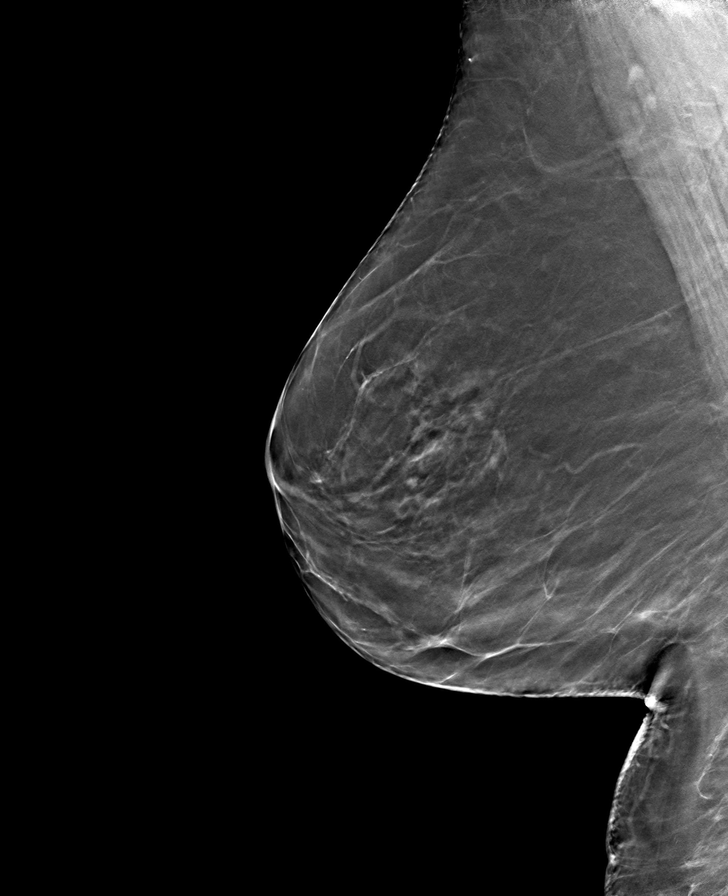

[L MLO tomo · tomo slice 35/69.0]
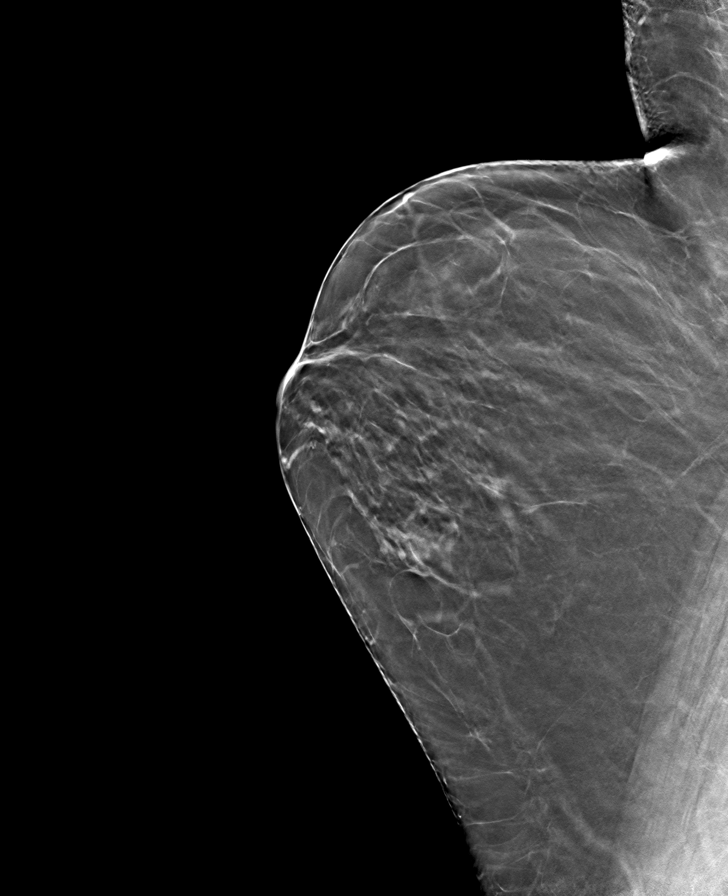

[8 of 24 positions shown; findings below may reference images not displayed]

ACR Breast Density Category b: There are scattered areas of
fibroglandular density.
FINDINGS: There are no findings suspicious for malignancy. Images were
processed with CAD.
IMPRESSION: No mammographic evidence of malignancy. A result letter of this
screening mammogram will be mailed directly to the patient.

RECOMMENDATION:
Screening mammogram in one year. (Code:CN-[id number])

BI-RADS CATEGORY  1: Negative.

## 2020-05-18 ENCOUNTER — Other Ambulatory Visit: Payer: Self-pay

## 2020-05-18 ENCOUNTER — Ambulatory Visit: Payer: Medicare Other | Admitting: Gastroenterology

## 2020-05-18 VITALS — BP 110/76 | HR 93 | Temp 98.3°F | Ht 62.0 in | Wt 158.0 lb

## 2020-05-18 DIAGNOSIS — K7469 Other cirrhosis of liver: Secondary | ICD-10-CM | POA: Diagnosis not present

## 2020-05-18 DIAGNOSIS — D509 Iron deficiency anemia, unspecified: Secondary | ICD-10-CM | POA: Diagnosis not present

## 2020-05-18 DIAGNOSIS — Z1211 Encounter for screening for malignant neoplasm of colon: Secondary | ICD-10-CM

## 2020-05-18 DIAGNOSIS — K2289 Other specified disease of esophagus: Secondary | ICD-10-CM

## 2020-05-18 NOTE — Progress Notes (Signed)
Tammy Antigua, MD [address]  [place]  [name], [name] [address]  Main: [phone]  Fax: [phone]   Primary Care Physician: Tammy Pink, MD   Chief Complaint  Patient presents with  . Follow-up    Elevated LFTs    HPI: Tammy Arias is a 64 y.o. 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  Patient denies any confusion, bleeding episodes, abdominal distention or change extremity edema.  Anemia continues to improve with iron replacement by hematology  EGD, December 19, 2018 showed salmon-colored mucosa at the GE junction which was not biopsied due to anemia at the time. 5 nonbleeding angiectasia seen in the stomach treated with APC. Gastric erosions were reported. Otherwise normal study with no active bleeding.  Colonoscopy December 19, 2018 showed a fair prep, 7 nonbleeding angiectasia is noted treated with APC. Nonbleeding internal hemorrhoids. No active bleeding seen.  Patient also was found to have cirrhosis on CT abdomen pelvis and this is thought to be due to her CHF and she is following cardiology for this.  Viral and autoimmune hepatitis work-up has been negative.  Liver enzymes have been normal.  Ultrasound in July 2020 - for any lesions  Current Outpatient Medications  Medication Sig Dispense Refill  . albuterol (PROVENTIL HFA;VENTOLIN HFA) 108 (90 Base) MCG/ACT inhaler Inhale 2 puffs into the lungs every 6 (six) hours as needed for wheezing or shortness of breath.    . ALPRAZolam (XANAX) 1 MG tablet Take 1 mg by mouth daily as needed for anxiety.    Marland Kitchen aspirin EC 81 MG tablet Take 81 mg by mouth daily.    . carvedilol (COREG) 3.125 MG tablet Take 3.125 mg by mouth 2 (two) times daily with a meal.    . Cyanocobalamin (VITAMIN B 12 PO) Take 1 tablet by mouth daily.    . cyclobenzaprine (FLEXERIL) 5 MG tablet Take by mouth.    . fluticasone (FLONASE) 50 MCG/ACT nasal spray Place into the nose.      . furosemide (LASIX) 40 MG tablet Take 80 mg by mouth daily.     Marland Kitchen latanoprost (XALATAN) 0.005 % ophthalmic solution Place 1 drop into both eyes at bedtime.    Marland Kitchen levothyroxine (SYNTHROID, LEVOTHROID) 75 MCG tablet Take 75 mcg by mouth daily before breakfast.    . lisinopril (PRINIVIL,ZESTRIL) 5 MG tablet Take 5 mg by mouth daily.    Marland Kitchen lovastatin (MEVACOR) 10 MG tablet Take 10 mg by mouth at bedtime.    . metFORMIN (GLUCOPHAGE) 1000 MG tablet Take 1,000 mg by mouth 2 (two) times daily with a meal.    . Misc Natural Products (NEURIVA) CAPS Take by mouth.    . spironolactone (ALDACTONE) 25 MG tablet Take 25 mg by mouth daily.    Marland Kitchen warfarin (COUMADIN) 3 MG tablet Take 3 mg by mouth at bedtime. 3.5mg  M-W-F, 3mg  Tues, Thurs, Sat, Sun     No current facility-administered medications for this visit.    Allergies as of 05/18/2020 - Review Complete 05/18/2020  Allergen Reaction Noted  . Vioxx [rofecoxib] Swelling 03/24/2017    ROS:  General: Negative for anorexia, weight loss, fever, chills, fatigue, weakness. ENT: Negative for hoarseness, difficulty swallowing , nasal congestion. CV: Negative for chest pain, angina, palpitations, dyspnea on exertion, peripheral edema.  Respiratory: Negative for dyspnea at rest, dyspnea on exertion, cough, sputum, wheezing.  GI: See history of present illness. GU:  Negative for dysuria, hematuria,  urinary incontinence, urinary frequency, nocturnal urination.  Endo: Negative for unusual weight change.    Physical Examination:   BP 110/76   Pulse 93   Temp 98.3 F (36.8 C)   Ht 5\' 2"  (1.575 m)   Wt 158 lb (71.7 kg)   BMI 28.90 kg/m   General: Well-nourished, well-developed in no acute distress.  Eyes: No icterus. Conjunctivae Arias. Mouth: Oropharyngeal mucosa moist and Arias , no lesions erythema or exudate. Neck: Supple, Trachea midline Abdomen: Bowel sounds are normal, nontender, nondistended, no hepatosplenomegaly or masses, no abdominal bruits or  hernia , no rebound or guarding.   Extremities: No lower extremity edema. No clubbing or deformities. Neuro: Alert and oriented x 3.  Grossly intact. Skin: Warm and dry, no jaundice.   Psych: Alert and cooperative, normal mood and affect.   Labs: CMP     Component Value Date/Time   NA 137 01/08/2019 1025   K 3.9 01/08/2019 1025   CL 96 (L) 01/08/2019 1025   CO2 29 01/08/2019 1025   GLUCOSE 97 01/08/2019 1025   BUN 21 01/08/2019 1025   CREATININE 0.98 01/08/2019 1025   CALCIUM 9.8 01/08/2019 1025   PROT 7.4 09/12/2019 1340   ALBUMIN 5.0 (H) 09/12/2019 1340   AST 40 09/12/2019 1340   ALT 31 09/12/2019 1340   ALKPHOS 99 09/12/2019 1340   BILITOT 0.2 09/12/2019 1340   GFRNONAA >60 01/08/2019 1025   GFRAA >60 01/08/2019 1025   Lab Results  Component Value Date   WBC 4.1 12/19/2019   HGB 11.2 (L) 12/19/2019   HCT 33.8 (L) 12/19/2019   MCV 101.8 (H) 12/19/2019   PLT 122 (L) 12/19/2019    Imaging Studies: No results found.  Assessment and Plan:   Tammy Arias is a 64 y.o. 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 Fib No signs of decompensation  Previous colonoscopy had fair prep, repeat was discussed previously but patient did not schedule, agreeable to scheduling at this time.  She is due for screening as previous exam was done for anemia.  Repeat EGD indicated to biopsy salmon-colored mucosa seen previously  I have discussed alternative options, risks & benefits,  which include, but are not limited to, bleeding, infection, perforation,respiratory complication & drug reaction.  The patient agrees with this plan & written consent will be obtained.      Dr Tammy Arias

## 2020-05-19 LAB — HEPATIC FUNCTION PANEL
ALT: 24 IU/L (ref 0–32)
AST: 31 IU/L (ref 0–40)
Albumin: 4.7 g/dL (ref 3.8–4.8)
Alkaline Phosphatase: 92 IU/L (ref 48–121)
Bilirubin Total: <0.2 mg/dL (ref 0.0–1.2)
Bilirubin, Direct: 0.09 mg/dL (ref 0.00–0.40)
Total Protein: 6.8 g/dL (ref 6.0–8.5)

## 2020-05-22 ENCOUNTER — Other Ambulatory Visit: Payer: Medicare Other

## 2020-05-26 ENCOUNTER — Telehealth: Payer: Self-pay

## 2020-05-26 NOTE — Telephone Encounter (Signed)
Informed patient that Dr. Kyra Searles cleared patient for colonoscopy but she needed to stop the Coumadin 5 days before procedure and then restart it ASAP after procedure. Patient verbalized understanding of instructions

## 2020-05-27 ENCOUNTER — Ambulatory Visit
Admission: RE | Admit: 2020-05-27 | Discharge: 2020-05-27 | Disposition: A | Discharge status: Home / Self Care | Payer: Medicare Other | Source: Ambulatory Visit | Attending: Gastroenterology | Admitting: Gastroenterology

## 2020-05-27 ENCOUNTER — Other Ambulatory Visit: Payer: Self-pay

## 2020-05-27 DIAGNOSIS — K7469 Other cirrhosis of liver: Secondary | ICD-10-CM

## 2020-05-28 ENCOUNTER — Telehealth: Payer: Self-pay

## 2020-05-28 NOTE — Telephone Encounter (Signed)
Tammy Arias was contacted to let her know that Dr. Bartholome Bill gave her cardiac clearance and would like for her to stop taking 7 days prior to procedure and restart ASAP past procedure.

## 2020-06-15 ENCOUNTER — Other Ambulatory Visit: Payer: Self-pay

## 2020-06-15 ENCOUNTER — Other Ambulatory Visit
Admission: RE | Admit: 2020-06-15 | Discharge: 2020-06-15 | Disposition: A | Discharge status: Home / Self Care | Payer: Medicare Other | Source: Ambulatory Visit | Attending: Gastroenterology | Admitting: Gastroenterology

## 2020-06-15 DIAGNOSIS — Z20822 Contact with and (suspected) exposure to covid-19: Secondary | ICD-10-CM | POA: Diagnosis not present

## 2020-06-15 DIAGNOSIS — Z01812 Encounter for preprocedural laboratory examination: Secondary | ICD-10-CM | POA: Insufficient documentation

## 2020-06-15 LAB — SARS CORONAVIRUS 2 (TAT 6-24 HRS): SARS Coronavirus 2: NEGATIVE

## 2020-06-16 ENCOUNTER — Inpatient Hospital Stay: Payer: Medicare Other | Admitting: Oncology

## 2020-06-16 ENCOUNTER — Inpatient Hospital Stay: Payer: Medicare Other | Attending: Oncology

## 2020-06-16 ENCOUNTER — Encounter: Payer: Self-pay | Admitting: Oncology

## 2020-06-16 VITALS — BP 106/68 | HR 66 | Temp 98.2°F | Wt 156.9 lb

## 2020-06-16 DIAGNOSIS — Z7901 Long term (current) use of anticoagulants: Secondary | ICD-10-CM | POA: Insufficient documentation

## 2020-06-16 DIAGNOSIS — I110 Hypertensive heart disease with heart failure: Secondary | ICD-10-CM | POA: Insufficient documentation

## 2020-06-16 DIAGNOSIS — I509 Heart failure, unspecified: Secondary | ICD-10-CM | POA: Insufficient documentation

## 2020-06-16 DIAGNOSIS — K219 Gastro-esophageal reflux disease without esophagitis: Secondary | ICD-10-CM | POA: Diagnosis not present

## 2020-06-16 DIAGNOSIS — Z7982 Long term (current) use of aspirin: Secondary | ICD-10-CM | POA: Insufficient documentation

## 2020-06-16 DIAGNOSIS — I4891 Unspecified atrial fibrillation: Secondary | ICD-10-CM | POA: Diagnosis not present

## 2020-06-16 DIAGNOSIS — Z7984 Long term (current) use of oral hypoglycemic drugs: Secondary | ICD-10-CM | POA: Diagnosis not present

## 2020-06-16 DIAGNOSIS — Z803 Family history of malignant neoplasm of breast: Secondary | ICD-10-CM | POA: Diagnosis not present

## 2020-06-16 DIAGNOSIS — D509 Iron deficiency anemia, unspecified: Secondary | ICD-10-CM | POA: Diagnosis not present

## 2020-06-16 DIAGNOSIS — E785 Hyperlipidemia, unspecified: Secondary | ICD-10-CM | POA: Diagnosis not present

## 2020-06-16 DIAGNOSIS — E119 Type 2 diabetes mellitus without complications: Secondary | ICD-10-CM | POA: Diagnosis not present

## 2020-06-16 DIAGNOSIS — K589 Irritable bowel syndrome without diarrhea: Secondary | ICD-10-CM | POA: Diagnosis not present

## 2020-06-16 DIAGNOSIS — Z8673 Personal history of transient ischemic attack (TIA), and cerebral infarction without residual deficits: Secondary | ICD-10-CM | POA: Insufficient documentation

## 2020-06-16 DIAGNOSIS — Z801 Family history of malignant neoplasm of trachea, bronchus and lung: Secondary | ICD-10-CM | POA: Insufficient documentation

## 2020-06-16 DIAGNOSIS — D696 Thrombocytopenia, unspecified: Secondary | ICD-10-CM

## 2020-06-16 DIAGNOSIS — Z79899 Other long term (current) drug therapy: Secondary | ICD-10-CM | POA: Diagnosis not present

## 2020-06-16 DIAGNOSIS — E039 Hypothyroidism, unspecified: Secondary | ICD-10-CM | POA: Insufficient documentation

## 2020-06-16 DIAGNOSIS — Z833 Family history of diabetes mellitus: Secondary | ICD-10-CM | POA: Diagnosis not present

## 2020-06-16 DIAGNOSIS — D649 Anemia, unspecified: Secondary | ICD-10-CM

## 2020-06-16 DIAGNOSIS — Z87891 Personal history of nicotine dependence: Secondary | ICD-10-CM | POA: Insufficient documentation

## 2020-06-16 LAB — VITAMIN B12: Vitamin B-12: 508 pg/mL (ref 180–914)

## 2020-06-16 LAB — CBC WITH DIFFERENTIAL/PLATELET
Abs Immature Granulocytes: 0.02 10*3/uL (ref 0.00–0.07)
Basophils Absolute: 0 10*3/uL (ref 0.0–0.1)
Basophils Relative: 0 %
Eosinophils Absolute: 0.5 10*3/uL (ref 0.0–0.5)
Eosinophils Relative: 10 %
HCT: 35.5 % — ABNORMAL LOW (ref 36.0–46.0)
Hemoglobin: 12.4 g/dL (ref 12.0–15.0)
Immature Granulocytes: 0 %
Lymphocytes Relative: 13 %
Lymphs Abs: 0.6 10*3/uL — ABNORMAL LOW (ref 0.7–4.0)
MCH: 33.6 pg (ref 26.0–34.0)
MCHC: 34.9 g/dL (ref 30.0–36.0)
MCV: 96.2 fL (ref 80.0–100.0)
Monocytes Absolute: 0.4 10*3/uL (ref 0.1–1.0)
Monocytes Relative: 8 %
Neutro Abs: 3.1 10*3/uL (ref 1.7–7.7)
Neutrophils Relative %: 69 %
Platelets: 128 10*3/uL — ABNORMAL LOW (ref 150–400)
RBC: 3.69 MIL/uL — ABNORMAL LOW (ref 3.87–5.11)
RDW: 12.1 % (ref 11.5–15.5)
WBC: 4.6 10*3/uL (ref 4.0–10.5)
nRBC: 0 % (ref 0.0–0.2)

## 2020-06-16 LAB — IRON AND TIBC
Iron: 140 ug/dL (ref 28–170)
Saturation Ratios: 27 % (ref 10.4–31.8)
TIBC: 521 ug/dL — ABNORMAL HIGH (ref 250–450)
UIBC: 381 ug/dL

## 2020-06-16 LAB — FERRITIN: Ferritin: 39 ng/mL (ref 11–307)

## 2020-06-16 NOTE — Progress Notes (Signed)
Hematology/Oncology Consult note [facility]  [phone] Fax:([phone]  Patient Care Team: [place] Pink, MD as PCP - General (Family Medicine) Sindy Guadeloupe, MD as Consulting Physician (Hematology and Oncology)   Name of the patient: Tammy Arias  [id number]  1956-10-21   Date of visit: 06/16/20  Diagnosis- iron deficiency anemia  Chief complaint/ Reason for visit-routine follow-up of iron deficiency anemia  Heme/Onc history: patient is a 64 year old Caucasian female with a past medical history significant for congestive heart failure with an EF of 35%, history of A. 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 Recent labs from 12/20/2018 showed white count of 4.1, H&H of 7.8/25.9 with an MCV of 84.1 and a platelet count of 135. Ferritin levels were low at 11. Iron studies showed elevated TIBC of 527 and low iron saturation of 4%. She underwent EGD and colonoscopy. EGD showed salmon-colored mucosa suspicious for short segment Barrett's. 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 Nonbleeding erosive gastropathy. Normal examined duodenum. No specimens collected.Colonoscopy showed 7 nonbleeding colonic angiectasia treated with APC. Nonbleeding internal hemorrhoids. Cause of her anemia attributed to multiple AVMs in her stomach and colon   Interval history-patient feels well and denies any complaints at this time.  She has an EGD and colonoscopy scheduled for tomorrow.  Denies any dark melanotic stools or bright red blood per rectum  ECOG PS- 1 Pain scale- 0   Review of systems- Review of Systems  Constitutional: Negative for chills, fever, malaise/fatigue and weight loss.  HENT: Negative for congestion, ear discharge and nosebleeds.   Eyes: Negative for blurred vision.  Respiratory: Negative for cough, hemoptysis, sputum production,  shortness of breath and wheezing.   Cardiovascular: Negative for chest pain, palpitations, orthopnea and claudication.  Gastrointestinal: Negative for abdominal pain, blood in stool, constipation, diarrhea, heartburn, melena, nausea and vomiting.  Genitourinary: Negative for dysuria, flank pain, frequency, hematuria and urgency.  Musculoskeletal: Negative for back pain, joint pain and myalgias.  Skin: Negative for rash.  Neurological: Negative for dizziness, tingling, focal weakness, seizures, weakness and headaches.  Endo/Heme/Allergies: Does not bruise/bleed easily.  Psychiatric/Behavioral: Negative for depression and suicidal ideas. The patient does not have insomnia.       Allergies  Allergen Reactions  . Vioxx [Rofecoxib] Swelling     Past Medical History:  Diagnosis Date  . Anemia   . Arthritis   . Atrial fibrillation ([place])   . Cardiomyopathy ([place])   . CHF (congestive heart failure) ([place])   . Coronary artery disease   . Diabetes mellitus without complication ([place])   . Dyspnea    with exertion  . Dysrhythmia   . GERD (gastroesophageal reflux disease)   . Headache    migraines  . Heart murmur   . History of shingles 2004  . Hyperlipidemia   . Hypertension   . Hypothyroidism   . IBS (irritable bowel syndrome)   . TIA (transient ischemic attack)   . Vitamin B 12 deficiency      Past Surgical History:  Procedure Laterality Date  . CARDIAC CATHETERIZATION    . COLONOSCOPY N/A 12/19/2018   Procedure: COLONOSCOPY;  Surgeon: Virgel Manifold, MD;  Location: [facility] ENDOSCOPY;  Service: Endoscopy;  Laterality: N/A;  . CORONARY ARTERY BYPASS GRAFT    . ESOPHAGOGASTRODUODENOSCOPY N/A 12/19/2018   Procedure: ESOPHAGOGASTRODUODENOSCOPY (EGD);  Surgeon: Virgel Manifold, MD;  Location: [facility] ENDOSCOPY;  Service: Endoscopy;  Laterality: N/A;  . EYE SURGERY Bilateral    Cataract Extraction with IOL  . RIGHT/LEFT HEART CATH AND CORONARY ANGIOGRAPHY Bilateral 03/29/2017    Procedure: Right/Left Heart Cath and Coronary Angiography;  Surgeon: Teodoro Spray, MD;  Location: [place] CV LAB;  Service: Cardiovascular;  Laterality: Bilateral;  . TUBAL LIGATION    . VENTRAL HERNIA REPAIR N/A 04/21/2017   Procedure: HERNIA REPAIR VENTRAL ADULT;  Surgeon: Leonie Green, MD;  Location: [facility] ORS;  Service: General;  Laterality: N/A;    Social History   Socioeconomic History  . Marital status: Widowed    Spouse name: Not on file  . Number of children: Not on file  . Years of education: Not on file  . Highest education level: Not on file  Occupational History  . Occupation: Automotive engineer  Tobacco Use  . Smoking status: Former Smoker    Types: Cigarettes    Quit date: 03/30/1999    Years since quitting: 21.2  . Smokeless tobacco: Never Used  Vaping Use  . Vaping Use: Never used  Substance and Sexual Activity  . Alcohol use: No  . Drug use: No  . Sexual activity: Not Currently  Other Topics Concern  . Not on file  Social History Narrative  . Not on file   Social Determinants of Health   Financial Resource Strain:   . Difficulty of Paying Living Expenses:   Food Insecurity:   . Worried About Charity fundraiser in the Last Year:   . Arboriculturist in the Last Year:   Transportation Needs:   . Film/video editor (Medical):   Marland Kitchen Lack of Transportation (Non-Medical):   Physical Activity:   . Days of Exercise per Week:   . Minutes of Exercise per Session:   Stress:   . Feeling of Stress :   Social Connections:   . Frequency of Communication with Friends and Family:   . Frequency of Social Gatherings with Friends and Family:   . Attends Religious Services:   . Active Member of Clubs or Organizations:   . Attends Archivist Meetings:   Marland Kitchen Marital Status:   Intimate Partner Violence:   . Fear of Current or Ex-Partner:   . Emotionally Abused:   Marland Kitchen Physically Abused:   . Sexually Abused:     Family History    Problem Relation Age of Onset  . Cancer Sister        lung  . Cancer Sister        cervical  . Valvular heart disease Mother   . Diabetes Father   . Breast cancer Neg Hx      Current Outpatient Medications:  .  albuterol (PROVENTIL HFA;VENTOLIN HFA) 108 (90 Base) MCG/ACT inhaler, Inhale 2 puffs into the lungs every 6 (six) hours as needed for wheezing or shortness of breath., Disp: , Rfl:  .  ALPRAZolam (XANAX) 1 MG tablet, Take 1 mg by mouth daily as needed for anxiety., Disp: , Rfl:  .  aspirin EC 81 MG tablet, Take 81 mg by mouth daily., Disp: , Rfl:  .  carvedilol (COREG) 3.125 MG tablet, Take 3.125 mg by mouth 2 (two) times daily with a meal., Disp: , Rfl:  .  Cyanocobalamin (VITAMIN B 12 PO), Take 1 tablet by mouth daily., Disp: , Rfl:  .  cyclobenzaprine (FLEXERIL) 5 MG tablet, Take by mouth., Disp: , Rfl:  .  fluticasone (FLONASE) 50 MCG/ACT nasal  spray, Place into the nose., Disp: , Rfl:  .  furosemide (LASIX) 40 MG tablet, Take 80 mg by mouth daily. , Disp: , Rfl:  .  latanoprost (XALATAN) 0.005 % ophthalmic solution, Place 1 drop into both eyes at bedtime., Disp: , Rfl:  .  levothyroxine (SYNTHROID, LEVOTHROID) 75 MCG tablet, Take 75 mcg by mouth daily before breakfast., Disp: , Rfl:  .  lisinopril (PRINIVIL,ZESTRIL) 5 MG tablet, Take 5 mg by mouth daily., Disp: , Rfl:  .  lovastatin (MEVACOR) 10 MG tablet, Take 10 mg by mouth at bedtime., Disp: , Rfl:  .  metFORMIN (GLUCOPHAGE) 1000 MG tablet, Take 1,000 mg by mouth 2 (two) times daily with a meal., Disp: , Rfl:  .  Misc Natural Products (NEURIVA) CAPS, Take by mouth., Disp: , Rfl:  .  spironolactone (ALDACTONE) 25 MG tablet, Take 25 mg by mouth daily., Disp: , Rfl:  .  warfarin (COUMADIN) 3 MG tablet, Take 3 mg by mouth at bedtime. 3.5mg  M-W-F, 3mg  Tues, Thurs, Sat, Sun, Disp: , Rfl:   Physical exam:  Vitals:   06/16/20 0927  BP: 106/68  Pulse: 66  Temp: 98.2 F (36.8 C)  TempSrc: Oral  SpO2: 100%  Weight: 156 lb  14.4 oz (71.2 kg)   Physical Exam HENT:     Head: Normocephalic and atraumatic.  Eyes:     Pupils: Pupils are equal, round, and reactive to light.  Cardiovascular:     Rate and Rhythm: Normal rate and regular rhythm.     Heart sounds: Normal heart sounds.  Pulmonary:     Effort: Pulmonary effort is normal.     Breath sounds: Normal breath sounds.  Abdominal:     General: Bowel sounds are normal.     Palpations: Abdomen is soft.  Musculoskeletal:     Cervical back: Normal range of motion.  Skin:    General: Skin is warm and dry.  Neurological:     Mental Status: She is alert and oriented to person, place, and time.      CMP Latest Ref Rng & Units 05/18/2020  Glucose 70 - 99 mg/dL -  BUN 8 - 23 mg/dL -  Creatinine 0.44 - 1.00 mg/dL -  Sodium 135 - 145 mmol/L -  Potassium 3.5 - 5.1 mmol/L -  Chloride 98 - 111 mmol/L -  CO2 22 - 32 mmol/L -  Calcium 8.9 - 10.3 mg/dL -  Total Protein 6.0 - 8.5 g/dL 6.8  Total Bilirubin 0.0 - 1.2 mg/dL <0.2  Alkaline Phos 48 - 121 IU/L 92  AST 0 - 40 IU/L 31  ALT 0 - 32 IU/L 24   CBC Latest Ref Rng & Units 06/16/2020  WBC 4.0 - 10.5 K/uL 4.6  Hemoglobin 12.0 - 15.0 g/dL 12.4  Hematocrit 36 - 46 % 35.5(L)  Platelets 150 - 400 K/uL 128(L)    No images are attached to the encounter.  US Abdomen Limited RUQ  Result Date: 05/27/2020 CLINICAL DATA:  Hepatic cirrhosis EXAM: ULTRASOUND ABDOMEN LIMITED RIGHT UPPER QUADRANT COMPARISON:  Ultrasound right upper quadrant June 10, 2019 FINDINGS: Gallbladder: Within the gallbladder, there is either one fairly large or multiple smaller adherent echogenic foci which move and shadow consistent with cholelithiasis. This gallstone versus conglomeration of gallstones measures 1.6 cm in length. There is no gallbladder wall thickening or pericholecystic fluid. No sonographic Murphy sign noted by sonographer. Common bile duct: Diameter: 3 mm. No intrahepatic or extrahepatic biliary duct dilatation. Liver: No focal  lesion  identified. Liver echogenicity is increased and mildly coarsened. Liver contour is subtly nodular. Portal vein is patent on color Doppler imaging with normal direction of blood flow towards the liver. Other: None. IMPRESSION: 1. Cholelithiasis. No gallbladder wall thickening or pericholecystic fluid. 2. Appearance of the liver is again consistent with hepatic cirrhosis. No focal liver lesions evident. It must be cautioned that the sensitivity of ultrasound for detection of focal liver lesions is diminished in this circumstance. Electronically Signed   By: Lowella Grip III M.D.   On: 05/27/2020 14:21     Assessment and plan- Patient is a 64 y.o. female with history of iron deficiency anemia secondary to bleeding AVMs and thrombocytopenia here for routine follow-up  Iron studies are currently pending.  Hemoglobin is stable at 12.4.  It is unlikely that she will need IV iron at this time.  Last IV iron was given in February 2020.  We will continue to monitor her CBC ferritin and iron studies every 4 months and I will see her back in 8 months  Thrombocytopenia: Fluctuates between 110s to 140s.  Continue to monitor   Visit Diagnosis 1. Iron deficiency anemia, unspecified iron deficiency anemia type   2. Thrombocytopenia ([place])      Dr. Randa Evens, MD, MPH [facility] [phone] 06/16/2020 9:19 AM

## 2020-06-17 ENCOUNTER — Ambulatory Visit
Admission: RE | Admit: 2020-06-17 | Discharge: 2020-06-17 | Disposition: A | Discharge status: Home / Self Care | Payer: Medicare Other | Attending: Gastroenterology | Admitting: Gastroenterology

## 2020-06-17 ENCOUNTER — Encounter
Admission: RE | Disposition: A | Discharge status: Home / Self Care | Payer: Self-pay | Source: Home / Self Care | Attending: Gastroenterology

## 2020-06-17 ENCOUNTER — Ambulatory Visit: Payer: Medicare Other | Admitting: Anesthesiology

## 2020-06-17 ENCOUNTER — Other Ambulatory Visit: Payer: Self-pay

## 2020-06-17 DIAGNOSIS — I2510 Atherosclerotic heart disease of native coronary artery without angina pectoris: Secondary | ICD-10-CM | POA: Insufficient documentation

## 2020-06-17 DIAGNOSIS — Z79890 Hormone replacement therapy (postmenopausal): Secondary | ICD-10-CM | POA: Diagnosis not present

## 2020-06-17 DIAGNOSIS — E039 Hypothyroidism, unspecified: Secondary | ICD-10-CM | POA: Insufficient documentation

## 2020-06-17 DIAGNOSIS — Z7984 Long term (current) use of oral hypoglycemic drugs: Secondary | ICD-10-CM | POA: Insufficient documentation

## 2020-06-17 DIAGNOSIS — K2100 Gastro-esophageal reflux disease with esophagitis, without bleeding: Secondary | ICD-10-CM | POA: Insufficient documentation

## 2020-06-17 DIAGNOSIS — K3189 Other diseases of stomach and duodenum: Secondary | ICD-10-CM

## 2020-06-17 DIAGNOSIS — E119 Type 2 diabetes mellitus without complications: Secondary | ICD-10-CM | POA: Insufficient documentation

## 2020-06-17 DIAGNOSIS — Z1211 Encounter for screening for malignant neoplasm of colon: Secondary | ICD-10-CM | POA: Diagnosis not present

## 2020-06-17 DIAGNOSIS — I509 Heart failure, unspecified: Secondary | ICD-10-CM | POA: Diagnosis not present

## 2020-06-17 DIAGNOSIS — I110 Hypertensive heart disease with heart failure: Secondary | ICD-10-CM | POA: Diagnosis not present

## 2020-06-17 DIAGNOSIS — Z7901 Long term (current) use of anticoagulants: Secondary | ICD-10-CM | POA: Insufficient documentation

## 2020-06-17 DIAGNOSIS — Z87891 Personal history of nicotine dependence: Secondary | ICD-10-CM | POA: Insufficient documentation

## 2020-06-17 DIAGNOSIS — I4891 Unspecified atrial fibrillation: Secondary | ICD-10-CM | POA: Diagnosis not present

## 2020-06-17 DIAGNOSIS — Z951 Presence of aortocoronary bypass graft: Secondary | ICD-10-CM | POA: Diagnosis not present

## 2020-06-17 DIAGNOSIS — I429 Cardiomyopathy, unspecified: Secondary | ICD-10-CM | POA: Insufficient documentation

## 2020-06-17 DIAGNOSIS — K589 Irritable bowel syndrome without diarrhea: Secondary | ICD-10-CM | POA: Diagnosis not present

## 2020-06-17 DIAGNOSIS — R12 Heartburn: Secondary | ICD-10-CM | POA: Diagnosis present

## 2020-06-17 DIAGNOSIS — Z79899 Other long term (current) drug therapy: Secondary | ICD-10-CM | POA: Insufficient documentation

## 2020-06-17 DIAGNOSIS — M1990 Unspecified osteoarthritis, unspecified site: Secondary | ICD-10-CM | POA: Diagnosis not present

## 2020-06-17 DIAGNOSIS — K2270 Barrett's esophagus without dysplasia: Secondary | ICD-10-CM

## 2020-06-17 DIAGNOSIS — E785 Hyperlipidemia, unspecified: Secondary | ICD-10-CM | POA: Diagnosis not present

## 2020-06-17 DIAGNOSIS — K259 Gastric ulcer, unspecified as acute or chronic, without hemorrhage or perforation: Secondary | ICD-10-CM | POA: Diagnosis not present

## 2020-06-17 DIAGNOSIS — K5730 Diverticulosis of large intestine without perforation or abscess without bleeding: Secondary | ICD-10-CM | POA: Diagnosis not present

## 2020-06-17 DIAGNOSIS — K635 Polyp of colon: Secondary | ICD-10-CM | POA: Diagnosis not present

## 2020-06-17 DIAGNOSIS — D125 Benign neoplasm of sigmoid colon: Secondary | ICD-10-CM | POA: Diagnosis not present

## 2020-06-17 DIAGNOSIS — K2960 Other gastritis without bleeding: Secondary | ICD-10-CM | POA: Diagnosis not present

## 2020-06-17 DIAGNOSIS — Z8673 Personal history of transient ischemic attack (TIA), and cerebral infarction without residual deficits: Secondary | ICD-10-CM | POA: Insufficient documentation

## 2020-06-17 DIAGNOSIS — Z7982 Long term (current) use of aspirin: Secondary | ICD-10-CM | POA: Insufficient documentation

## 2020-06-17 DIAGNOSIS — Z833 Family history of diabetes mellitus: Secondary | ICD-10-CM | POA: Insufficient documentation

## 2020-06-17 DIAGNOSIS — K2289 Other specified disease of esophagus: Secondary | ICD-10-CM

## 2020-06-17 HISTORY — PX: COLONOSCOPY WITH PROPOFOL: SHX5780

## 2020-06-17 HISTORY — PX: ESOPHAGOGASTRODUODENOSCOPY (EGD) WITH PROPOFOL: SHX5813

## 2020-06-17 LAB — GLUCOSE, CAPILLARY: Glucose-Capillary: 118 mg/dL — ABNORMAL HIGH (ref 70–99)

## 2020-06-17 SURGERY — COLONOSCOPY WITH PROPOFOL
Anesthesia: General

## 2020-06-17 MED ORDER — LIDOCAINE HCL (CARDIAC) PF 100 MG/5ML IV SOSY
PREFILLED_SYRINGE | INTRAVENOUS | Status: DC | PRN
  Administered 2020-06-17: 100 mg via INTRAVENOUS

## 2020-06-17 MED ORDER — PROPOFOL 500 MG/50ML IV EMUL
INTRAVENOUS | Status: DC | PRN
  Administered 2020-06-17: 100 ug/kg/min via INTRAVENOUS

## 2020-06-17 MED ORDER — PROPOFOL 10 MG/ML IV BOLUS
INTRAVENOUS | Status: DC | PRN
  Administered 2020-06-17: 60 mg via INTRAVENOUS

## 2020-06-17 MED ORDER — SODIUM CHLORIDE 0.9 % IV SOLN: INTRAVENOUS | Status: DC

## 2020-06-17 MED ORDER — PHENYLEPHRINE HCL (PRESSORS) 10 MG/ML IV SOLN
INTRAVENOUS | Status: DC | PRN
  Administered 2020-06-17 (×5): 100 ug via INTRAVENOUS
  Administered 2020-06-17 (×2): 200 ug via INTRAVENOUS

## 2020-06-17 NOTE — Anesthesia Preprocedure Evaluation (Signed)
Anesthesia Evaluation  Patient identified by MRN, date of birth, ID band Patient awake    Reviewed: Allergy & Precautions, H&P , NPO status , Patient's Chart, lab work & pertinent test results, reviewed documented beta blocker date and time   Airway Mallampati: II   Neck ROM: full    Dental  (+) Poor Dentition   Pulmonary neg pulmonary ROS, shortness of breath, former smoker,    Pulmonary exam normal        Cardiovascular Exercise Tolerance: Poor hypertension, On Medications + CAD  negative cardio ROS Normal cardiovascular exam+ dysrhythmias Atrial Fibrillation + Valvular Problems/Murmurs  Rhythm:regular Rate:Normal     Neuro/Psych  Headaches, TIAnegative neurological ROS  negative psych ROS   GI/Hepatic negative GI ROS, Neg liver ROS, GERD  Medicated,  Endo/Other  negative endocrine ROSdiabetesHypothyroidism   Renal/GU negative Renal ROS  negative genitourinary   Musculoskeletal   Abdominal   Peds  Hematology negative hematology ROS (+) Blood dyscrasia, anemia ,   Anesthesia Other Findings Past Medical History: No date: Anemia No date: Arthritis No date: Atrial fibrillation ([facility]) No date: Cardiomyopathy ([place]) No date: CHF (congestive heart failure) ([facility]) No date: Coronary artery disease No date: Diabetes mellitus without complication ([facility]) No date: Dyspnea     Comment:  with exertion No date: Dysrhythmia No date: GERD (gastroesophageal reflux disease) No date: Headache     Comment:  migraines No date: Heart murmur 2004: History of shingles No date: Hyperlipidemia No date: Hypertension No date: Hypothyroidism No date: IBS (irritable bowel syndrome) No date: TIA (transient ischemic attack) No date: Vitamin B 12 deficiency Past Surgical History: No date: CARDIAC CATHETERIZATION 12/19/2018: COLONOSCOPY; N/A     Comment:  Procedure: COLONOSCOPY;  Surgeon: Virgel Manifold,               MD;   Location: [facility] ENDOSCOPY;  Service: Endoscopy;                Laterality: N/A; No date: CORONARY ARTERY BYPASS GRAFT 12/19/2018: ESOPHAGOGASTRODUODENOSCOPY; N/A     Comment:  Procedure: ESOPHAGOGASTRODUODENOSCOPY (EGD);  Surgeon:               Virgel Manifold, MD;  Location: [facility] ENDOSCOPY;                Service: Endoscopy;  Laterality: N/A; No date: EYE SURGERY; Bilateral     Comment:  Cataract Extraction with IOL 03/29/2017: RIGHT/LEFT HEART CATH AND CORONARY ANGIOGRAPHY; Bilateral     Comment:  Procedure: Right/Left Heart Cath and Coronary               Angiography;  Surgeon: Teodoro Spray, MD;  Location:               [place] CV LAB;  Service: Cardiovascular;                Laterality: Bilateral; No date: TUBAL LIGATION 04/21/2017: VENTRAL HERNIA REPAIR; N/A     Comment:  Procedure: HERNIA REPAIR VENTRAL ADULT;  Surgeon: Leonie Green, MD;  Location: [facility] ORS;  Service:               General;  Laterality: N/A;   Reproductive/Obstetrics negative OB ROS                             Anesthesia Physical Anesthesia Plan  ASA: IV  Anesthesia Plan: General   Post-op Pain Management:    Induction:   PONV Risk Score and Plan:   Airway Management Planned:   Additional Equipment:   Intra-op Plan:   Post-operative Plan:   Informed Consent: I have reviewed the patients History and Physical, chart, labs and discussed the procedure including the risks, benefits and alternatives for the proposed anesthesia with the patient or authorized representative who has indicated his/her understanding and acceptance.     Dental Advisory Given  Plan Discussed with: CRNA  Anesthesia Plan Comments:         Anesthesia Quick Evaluation

## 2020-06-17 NOTE — Anesthesia Postprocedure Evaluation (Signed)
Anesthesia Post Note  Patient: Arleen H Mario  Procedure(s) Performed: COLONOSCOPY WITH PROPOFOL (N/A ) ESOPHAGOGASTRODUODENOSCOPY (EGD) WITH PROPOFOL (N/A )  Patient location during evaluation: PACU Anesthesia Type: General Level of consciousness: awake and alert Pain management: pain level controlled Vital Signs Assessment: post-procedure vital signs reviewed and stable Respiratory status: spontaneous breathing, nonlabored ventilation, respiratory function stable and patient connected to nasal cannula oxygen Cardiovascular status: blood pressure returned to baseline and stable Postop Assessment: no apparent nausea or vomiting Anesthetic complications: no   No complications documented.   Last Vitals:  Vitals:   06/17/20 0811 06/17/20 0930  BP: 107/60 93/60  Pulse: 78 87  Resp: 16 15  Temp: (!) 36.2 C (!) 36.2 C  SpO2: 100% 100%    Last Pain:  Vitals:   06/17/20 0930  TempSrc: Temporal  PainSc:                  Molli Barrows

## 2020-06-17 NOTE — Op Note (Signed)
[facility] Gastroenterology Patient Name: Tammy Arias Procedure Date: 06/17/2020 8:13 AM MRN: [id number] Account #: [id number] Date of Birth: 03-04-1956 Admit Type: Outpatient Age: 64 Room: [facility] ENDO ROOM 2 Gender: Female Note Status: Finalized Procedure:             Colonoscopy Indications:           Screening for colorectal malignant neoplasm Providers:             Mardell Cragg B. Bonna Gains MD, MD Medicines:             Monitored Anesthesia Care Complications:         No immediate complications. Procedure:             Pre-Anesthesia Assessment:                        - Prior to the procedure, a History and Physical was                         performed, and patient medications, allergies and                         sensitivities were reviewed. The patient's tolerance                         of previous anesthesia was reviewed.                        - The risks and benefits of the procedure and the                         sedation options and risks were discussed with the                         patient. All questions were answered and informed                         consent was obtained.                        - Patient identification and proposed procedure were                         verified prior to the procedure by the physician, the                         nurse, the anesthesiologist, the anesthetist and the                         technician. The procedure was verified in the                         procedure room.                        - ASA Grade Assessment: II - A patient with mild                         systemic disease.  After obtaining informed consent, the colonoscope was                         passed under direct vision. Throughout the procedure,                         the patient's blood pressure, pulse, and oxygen                         saturations were monitored continuously. The                         Colonoscope  was introduced through the anus and                         advanced to the the cecum, identified by appendiceal                         orifice and ileocecal valve. The colonoscopy was                         performed with ease. The patient tolerated the                         procedure well. The quality of the bowel preparation                         was good. Findings:      The perianal and digital rectal examinations were normal.      Seven sessile polyps were found in the sigmoid colon. The polyps were 3       to 5 mm in size. These polyps were removed with a jumbo cold forceps.       Resection and retrieval were complete.      Multiple diverticula were found in the sigmoid colon.      The exam was otherwise without abnormality.      The rectum, sigmoid colon, descending colon, transverse colon, ascending       colon and cecum appeared normal.      The retroflexed view of the distal rectum and anal verge was normal and       showed no anal or rectal abnormalities. Impression:            - Seven 3 to 5 mm polyps in the sigmoid colon, removed                         with a jumbo cold forceps. Resected and retrieved.                        - Diverticulosis in the sigmoid colon.                        - The examination was otherwise normal.                        - The rectum, sigmoid colon, descending colon,                         transverse colon, ascending colon and cecum are normal.                        -  The distal rectum and anal verge are normal on                         retroflexion view. Recommendation:        - Discharge patient to home (with escort).                        - High fiber diet.                        - Advance diet as tolerated.                        - Continue present medications.                        - Await pathology results.                        - Repeat colonoscopy date to be determined after                         pending pathology results  are reviewed.                        - The findings and recommendations were discussed with                         the patient.                        - The findings and recommendations were discussed with                         the patient's family.                        - Return to primary care physician as previously                         scheduled. Procedure Code(s):     --- Professional ---                        [id number], Colonoscopy, flexible; with biopsy, single or                         multiple Diagnosis Code(s):     --- Professional ---                        Z12.11, Encounter for screening for malignant neoplasm                         of colon                        K63.5, Polyp of colon CPT copyright 2019 American Medical Association. All rights reserved. The codes documented in this report are preliminary and upon coder review may  be revised to meet current compliance requirements.  Vonda Antigua, MD Margretta Sidle B. Bonna Gains MD, MD 06/17/2020 9:35:44 AM This report has been signed electronically. Number of Addenda: 0 Note  Initiated On: 06/17/2020 8:13 AM Scope Withdrawal Time: 0 hours 23 minutes 20 seconds  Total Procedure Duration: 0 hours 27 minutes 24 seconds  Estimated Blood Loss:  Estimated blood loss: none.      [facility]

## 2020-06-17 NOTE — Op Note (Signed)
[facility] Gastroenterology Patient Name: Tammy Arias Procedure Date: 06/17/2020 7:39 AM MRN: [id number] Account #: [id number] Date of Birth: [birth year] Admit Type: Outpatient Age: 64 Room: [facility] ENDO ROOM 2 Gender: Female Note Status: Finalized Procedure:             Upper GI endoscopy Indications:           Heartburn, Suspected Barrett's esophagus Providers:             Hamzah Savoca B. Bonna Gains MD, MD Referring MD:          Irven Easterly. Kary Kos, MD (Referring MD) Medicines:             Monitored Anesthesia Care Complications:         No immediate complications. Procedure:             Pre-Anesthesia Assessment:                        - Prior to the procedure, a History and Physical was                         performed, and patient medications, allergies and                         sensitivities were reviewed. The patient's tolerance                         of previous anesthesia was reviewed.                        - The risks and benefits of the procedure and the                         sedation options and risks were discussed with the                         patient. All questions were answered and informed                         consent was obtained.                        - Patient identification and proposed procedure were                         verified prior to the procedure by the physician, the                         nurse, the anesthesiologist, the anesthetist and the                         technician. The procedure was verified in the                         procedure room.                        - ASA Grade Assessment: II - A patient with mild  systemic disease.                        After obtaining informed consent, the endoscope was                         passed under direct vision. Throughout the procedure,                         the patient's blood pressure, pulse, and oxygen                         saturations were  monitored continuously. The Endoscope                         was introduced through the mouth, and advanced to the                         second part of duodenum. The upper GI endoscopy was                         accomplished with ease. The patient tolerated the                         procedure well. Findings:      The Z-line was irregular. Mucosa was biopsied with a cold forceps for       histology in a targeted manner and in 4 quadrants. One specimen bottle       was sent to pathology.      Salmon-colored mucosa was present.      The exam of the esophagus was otherwise normal.      Patchy mildly erythematous mucosa without bleeding was found in the       gastric antrum. Biopsies were taken with a cold forceps for histology.       Biopsies were obtained in the gastric body, at the incisura and in the       gastric antrum with cold forceps for histology.      The exam of the stomach was otherwise normal.      The duodenal bulb, second portion of the duodenum and examined duodenum       were normal. Impression:            - Z-line irregular. Biopsied.                        - Erythematous mucosa in the antrum. Biopsied.                        - Normal duodenal bulb, second portion of the duodenum                         and examined duodenum.                        - Biopsies were obtained in the gastric body, at the                         incisura and in the gastric antrum. Recommendation:        - Await pathology results.                        -  Discharge patient to home (with escort).                        - Advance diet as tolerated.                        - Continue present medications.                        - Patient has a contact number available for                         emergencies. The signs and symptoms of potential                         delayed complications were discussed with the patient.                         Return to normal activities tomorrow. Written                          discharge instructions were provided to the patient.                        - Discharge patient to home (with escort).                        - The findings and recommendations were discussed with                         the patient.                        - The findings and recommendations were discussed with                         the patient's family.                        - Follow an antireflux regimen. Procedure Code(s):     --- Professional ---                        [phone], Esophagogastroduodenoscopy, flexible,                         transoral; with biopsy, single or multiple Diagnosis Code(s):     --- Professional ---                        K22.8, Other specified diseases of esophagus                        K31.89, Other diseases of stomach and duodenum                        R12, Heartburn CPT copyright 2019 American Medical Association. All rights reserved. The codes documented in this report are preliminary and upon coder review may  be revised to meet current compliance requirements.  Vonda Antigua, MD Margretta Sidle B. Bonna Gains MD, MD 06/17/2020 9:02:51 AM This report has been signed electronically. Number of Addenda: 0 Note  Initiated On: 06/17/2020 7:39 AM Estimated Blood Loss:  Estimated blood loss: none.      [facility]

## 2020-06-17 NOTE — H&P (Signed)
Vonda Antigua, MD [address], [place], [place], [place], [address] [address], [place], [place], [place], [address] Phone: [phone]  Fax: [phone]  Primary Care Physician:  Maryland Pink, MD   Pre-Procedure History & Physical: HPI:  Tammy Arias is a 64 y.o. female is here for a colonoscopy and EGD.   Past Medical History:  Diagnosis Date  . Anemia   . Arthritis   . Atrial fibrillation ([place])   . Cardiomyopathy ([place])   . CHF (congestive heart failure) ([place])   . Coronary artery disease   . Diabetes mellitus without complication ([place])   . Dyspnea    with exertion  . Dysrhythmia   . GERD (gastroesophageal reflux disease)   . Headache    migraines  . Heart murmur   . History of shingles 2004  . Hyperlipidemia   . Hypertension   . Hypothyroidism   . IBS (irritable bowel syndrome)   . TIA (transient ischemic attack)   . Vitamin B 12 deficiency     Past Surgical History:  Procedure Laterality Date  . CARDIAC CATHETERIZATION    . COLONOSCOPY N/A 12/19/2018   Procedure: COLONOSCOPY;  Surgeon: Virgel Manifold, MD;  Location: [facility] ENDOSCOPY;  Service: Endoscopy;  Laterality: N/A;  . CORONARY ARTERY BYPASS GRAFT    . ESOPHAGOGASTRODUODENOSCOPY N/A 12/19/2018   Procedure: ESOPHAGOGASTRODUODENOSCOPY (EGD);  Surgeon: Virgel Manifold, MD;  Location: [facility] ENDOSCOPY;  Service: Endoscopy;  Laterality: N/A;  . EYE SURGERY Bilateral    Cataract Extraction with IOL  . RIGHT/LEFT HEART CATH AND CORONARY ANGIOGRAPHY Bilateral 03/29/2017   Procedure: Right/Left Heart Cath and Coronary Angiography;  Surgeon: Teodoro Spray, MD;  Location: [place] CV LAB;  Service: Cardiovascular;  Laterality: Bilateral;  . TUBAL LIGATION    . VENTRAL HERNIA REPAIR N/A 04/21/2017   Procedure: HERNIA REPAIR VENTRAL ADULT;  Surgeon: Leonie Green, MD;  Location: [facility] ORS;  Service: General;  Laterality: N/A;    Prior to Admission medications   Medication Sig Start  Date End Date Taking? Authorizing Provider  albuterol (PROVENTIL HFA;VENTOLIN HFA) 108 (90 Base) MCG/ACT inhaler Inhale 2 puffs into the lungs every 6 (six) hours as needed for wheezing or shortness of breath.   Yes [provider]  aspirin EC 81 MG tablet Take 81 mg by mouth daily.   Yes [provider]  carvedilol (COREG) 3.125 MG tablet Take 3.125 mg by mouth 2 (two) times daily with a meal.   Yes [provider]  Cyanocobalamin (VITAMIN B 12 PO) Take 1 tablet by mouth daily.   Yes [provider]  cyclobenzaprine (FLEXERIL) 5 MG tablet Take by mouth.  03/21/19  Yes [provider]  furosemide (LASIX) 40 MG tablet Take 80 mg by mouth daily.    Yes [provider]  latanoprost (XALATAN) 0.005 % ophthalmic solution Place 1 drop into both eyes at bedtime.   Yes [provider]  levothyroxine (SYNTHROID, LEVOTHROID) 75 MCG tablet Take 75 mcg by mouth daily before breakfast.   Yes [provider]  lisinopril (PRINIVIL,ZESTRIL) 5 MG tablet Take 5 mg by mouth daily.   Yes [provider]  lovastatin (MEVACOR) 10 MG tablet Take 10 mg by mouth at bedtime.   Yes [provider]  metFORMIN (GLUCOPHAGE) 1000 MG tablet Take 1,000 mg by mouth 2 (two) times daily with a meal.   Yes [provider]  Misc Natural Products (NEURIVA) CAPS Take by mouth.   Yes [provider]  spironolactone (ALDACTONE) 25 MG tablet Take 25 mg by mouth daily.   Yes [provider]  warfarin (COUMADIN) 3 MG tablet Take 3 mg by mouth at bedtime. 3.5mg  M-W-F, 3mg  Tues, Thurs, Sat, Sun   Yes [provider]  ALPRAZolam Duanne Moron) 1 MG tablet Take 1 mg by mouth daily as needed for anxiety. Patient not taking: Reported on 06/17/2020    [provider]  fluticasone (FLONASE) 50 MCG/ACT nasal spray Place 1 spray into the nose.  02/05/19 02/05/20  [provider]    Allergies as of 05/18/2020 - Review  Complete 05/18/2020  Allergen Reaction Noted  . Vioxx [rofecoxib] Swelling 03/24/2017    Family History  Problem Relation Age of Onset  . Cancer Sister        lung  . Cancer Sister        cervical  . Valvular heart disease Mother   . Diabetes Father   . Breast cancer Neg Hx     Social History   Socioeconomic History  . Marital status: Widowed    Spouse name: Not on file  . Number of children: Not on file  . Years of education: Not on file  . Highest education level: Not on file  Occupational History  . Occupation: Automotive engineer  Tobacco Use  . Smoking status: Former Smoker    Types: Cigarettes    Quit date: 03/30/1999    Years since quitting: 21.2  . Smokeless tobacco: Never Used  Vaping Use  . Vaping Use: Never used  Substance and Sexual Activity  . Alcohol use: No  . Drug use: No  . Sexual activity: Not Currently  Other Topics Concern  . Not on file  Social History Narrative  . Not on file   Social Determinants of Health   Financial Resource Strain:   . Difficulty of Paying Living Expenses:   Food Insecurity:   . Worried About Charity fundraiser in the Last Year:   . Arboriculturist in the Last Year:   Transportation Needs:   . Film/video editor (Medical):   Marland Kitchen Lack of Transportation (Non-Medical):   Physical Activity:   . Days of Exercise per Week:   . Minutes of Exercise per Session:   Stress:   . Feeling of Stress :   Social Connections:   . Frequency of Communication with Friends and Family:   . Frequency of Social Gatherings with Friends and Family:   . Attends Religious Services:   . Active Member of Clubs or Organizations:   . Attends Archivist Meetings:   Marland Kitchen Marital Status:   Intimate Partner Violence:   . Fear of Current or Ex-Partner:   . Emotionally Abused:   Marland Kitchen Physically Abused:   . Sexually Abused:     Review of Systems: See HPI, otherwise negative ROS  Physical Exam: BP 107/60   Pulse 78   Temp  (!) 97.2 F (36.2 C) (Temporal)   Resp 16   Ht 5\' 1"  (1.549 m)   Wt 68.9 kg   SpO2 100%   BMI 28.72 kg/m  General:   Alert,  pleasant and cooperative in NAD Head:  Normocephalic and atraumatic. Neck:  Supple; no masses or thyromegaly. Lungs:  Clear throughout to auscultation, normal respiratory effort.    Heart:  +S1, +S2, Regular rate and rhythm, No edema. Abdomen:  Soft, nontender and nondistended. Normal bowel sounds, without guarding, and without rebound.   Neurologic:  Alert and  oriented x4;  grossly normal neurologically.  Impression/Plan: Tammy Arias is here for a colonoscopy to be performed for average risk screening and EGD for Acid Reflux and biopsies for barrets.  Risks, benefits, limitations, and alternatives regarding the procedures have been reviewed with the patient.  Questions have been answered.  All parties agreeable.   Virgel Manifold, MD  06/17/2020, 8:45 AM

## 2020-06-17 NOTE — Transfer of Care (Signed)
Immediate Anesthesia Transfer of Care Note  Patient: Tammy Arias  Procedure(s) Performed: COLONOSCOPY WITH PROPOFOL (N/A ) ESOPHAGOGASTRODUODENOSCOPY (EGD) WITH PROPOFOL (N/A )  Patient Location: PACU  Anesthesia Type:General  Level of Consciousness: awake, alert  and oriented  Airway & Oxygen Therapy: Patient Spontanous Breathing and Patient connected to nasal cannula oxygen  Post-op Assessment: Report given to RN and Post -op Vital signs reviewed and stable  Post vital signs: Reviewed and stable  Last Vitals:  Vitals Value Taken Time  BP 93/60 06/17/20 0935  Temp    Pulse 87 06/17/20 0936  Resp 20 06/17/20 0936  SpO2 100 % 06/17/20 0936  Vitals shown include unvalidated device data.  Last Pain:  Vitals:   06/17/20 0811  TempSrc: Temporal  PainSc: 0-No pain         Complications: No complications documented.

## 2020-06-18 ENCOUNTER — Encounter: Payer: Self-pay | Admitting: Gastroenterology

## 2020-06-18 NOTE — Telephone Encounter (Signed)
Error

## 2020-06-19 LAB — SURGICAL PATHOLOGY

## 2020-06-25 ENCOUNTER — Encounter: Payer: Self-pay | Admitting: Gastroenterology

## 2020-08-18 ENCOUNTER — Other Ambulatory Visit: Payer: Self-pay | Admitting: Family Medicine

## 2020-08-18 DIAGNOSIS — Z1231 Encounter for screening mammogram for malignant neoplasm of breast: Secondary | ICD-10-CM

## 2020-09-11 ENCOUNTER — Other Ambulatory Visit: Payer: Self-pay

## 2020-09-11 ENCOUNTER — Ambulatory Visit
Admission: RE | Admit: 2020-09-11 | Discharge: 2020-09-11 | Disposition: A | Discharge status: Home / Self Care | Payer: Medicare Other | Source: Ambulatory Visit | Attending: Family Medicine | Admitting: Family Medicine

## 2020-09-11 DIAGNOSIS — Z1231 Encounter for screening mammogram for malignant neoplasm of breast: Secondary | ICD-10-CM | POA: Diagnosis present

## 2020-10-16 ENCOUNTER — Other Ambulatory Visit: Payer: Self-pay

## 2020-10-16 ENCOUNTER — Inpatient Hospital Stay: Payer: Medicare Other | Attending: Oncology

## 2020-10-16 DIAGNOSIS — D509 Iron deficiency anemia, unspecified: Secondary | ICD-10-CM | POA: Diagnosis present

## 2020-10-16 DIAGNOSIS — D696 Thrombocytopenia, unspecified: Secondary | ICD-10-CM

## 2020-10-16 LAB — CBC
HCT: 31.3 % — ABNORMAL LOW (ref 36.0–46.0)
Hemoglobin: 10.1 g/dL — ABNORMAL LOW (ref 12.0–15.0)
MCH: 30.8 pg (ref 26.0–34.0)
MCHC: 32.3 g/dL (ref 30.0–36.0)
MCV: 95.4 fL (ref 80.0–100.0)
Platelets: 110 10*3/uL — ABNORMAL LOW (ref 150–400)
RBC: 3.28 MIL/uL — ABNORMAL LOW (ref 3.87–5.11)
RDW: 13.1 % (ref 11.5–15.5)
WBC: 3 10*3/uL — ABNORMAL LOW (ref 4.0–10.5)
nRBC: 0 % (ref 0.0–0.2)

## 2020-10-16 LAB — IRON AND TIBC
Iron: 54 ug/dL (ref 28–170)
Saturation Ratios: 10 % — ABNORMAL LOW (ref 10.4–31.8)
TIBC: 524 ug/dL — ABNORMAL HIGH (ref 250–450)
UIBC: 470 ug/dL

## 2020-10-16 LAB — FERRITIN: Ferritin: 12 ng/mL (ref 11–307)

## 2020-10-16 NOTE — Progress Notes (Signed)
Called pt and got voicemail and left message that hgb 10.1 and ferritin is low and dr Janese Banks recommends get IV iron. If pt would like to move forward with this to just call the office and let us know. Left direct phone number to our clinical line for [facility]

## 2020-10-19 ENCOUNTER — Telehealth: Payer: Self-pay | Admitting: *Deleted

## 2020-10-19 NOTE — Telephone Encounter (Signed)
Pt called to say that she is ok with getting iron treatments. We will schedule them and give her a call about appts

## 2020-11-05 ENCOUNTER — Other Ambulatory Visit: Payer: Self-pay

## 2020-11-05 ENCOUNTER — Other Ambulatory Visit: Payer: Self-pay | Admitting: Oncology

## 2020-11-05 ENCOUNTER — Inpatient Hospital Stay: Payer: Medicare Other | Attending: Oncology

## 2020-11-05 VITALS — BP 98/56 | HR 61 | Temp 96.1°F | Resp 96

## 2020-11-05 DIAGNOSIS — D500 Iron deficiency anemia secondary to blood loss (chronic): Secondary | ICD-10-CM | POA: Diagnosis present

## 2020-11-05 DIAGNOSIS — Q2730 Arteriovenous malformation, site unspecified: Secondary | ICD-10-CM | POA: Diagnosis not present

## 2020-11-05 MED ORDER — SODIUM CHLORIDE 0.9 % IV SOLN
Freq: Once | INTRAVENOUS | Status: AC
  Filled 2020-11-05: qty 250

## 2020-11-05 MED ORDER — SODIUM CHLORIDE 0.9 % IV SOLN
510.0000 mg | Freq: Once | INTRAVENOUS | Status: AC
  Administered 2020-11-05: 510 mg via INTRAVENOUS
  Filled 2020-11-05: qty 510

## 2020-11-05 NOTE — Progress Notes (Signed)
Pt tolerated infusion well. No s/s of distress or reaction noted, pt and VS stable at discharge.

## 2020-11-12 ENCOUNTER — Inpatient Hospital Stay: Payer: Medicare Other

## 2020-11-12 VITALS — BP 113/76 | HR 78 | Temp 98.0°F

## 2020-11-12 DIAGNOSIS — D500 Iron deficiency anemia secondary to blood loss (chronic): Secondary | ICD-10-CM

## 2020-11-12 MED ORDER — SODIUM CHLORIDE 0.9 % IV SOLN
510.0000 mg | Freq: Once | INTRAVENOUS | Status: AC
  Administered 2020-11-12: 14:00:00 510 mg via INTRAVENOUS
  Filled 2020-11-12: qty 510

## 2020-11-12 MED ORDER — SODIUM CHLORIDE 0.9 % IV SOLN
Freq: Once | INTRAVENOUS | Status: AC
Start: 2020-11-12 — End: 2020-11-12
  Filled 2020-11-12: qty 250

## 2020-11-12 NOTE — Progress Notes (Signed)
Pt tolerated infusion well with no signs of complications. VSS. Pt stable for discharge.   Acire Tang  

## 2021-02-15 ENCOUNTER — Ambulatory Visit: Payer: Medicare Other | Admitting: Oncology

## 2021-02-15 ENCOUNTER — Other Ambulatory Visit: Payer: Medicare Other

## 2021-02-19 ENCOUNTER — Inpatient Hospital Stay: Payer: Medicare Other | Admitting: Oncology

## 2021-02-19 ENCOUNTER — Encounter: Payer: Self-pay | Admitting: Oncology

## 2021-02-19 ENCOUNTER — Other Ambulatory Visit (HOSPITAL_COMMUNITY): Payer: Self-pay | Admitting: Orthopedic Surgery

## 2021-02-19 ENCOUNTER — Other Ambulatory Visit: Payer: Self-pay

## 2021-02-19 ENCOUNTER — Inpatient Hospital Stay: Payer: Medicare Other | Attending: Oncology

## 2021-02-19 ENCOUNTER — Other Ambulatory Visit: Payer: Self-pay | Admitting: Orthopedic Surgery

## 2021-02-19 VITALS — BP 95/67 | HR 80 | Resp 18 | Wt 148.5 lb

## 2021-02-19 DIAGNOSIS — D696 Thrombocytopenia, unspecified: Secondary | ICD-10-CM

## 2021-02-19 DIAGNOSIS — Z7901 Long term (current) use of anticoagulants: Secondary | ICD-10-CM | POA: Diagnosis not present

## 2021-02-19 DIAGNOSIS — D500 Iron deficiency anemia secondary to blood loss (chronic): Secondary | ICD-10-CM | POA: Diagnosis not present

## 2021-02-19 DIAGNOSIS — Z87891 Personal history of nicotine dependence: Secondary | ICD-10-CM | POA: Insufficient documentation

## 2021-02-19 DIAGNOSIS — D509 Iron deficiency anemia, unspecified: Secondary | ICD-10-CM

## 2021-02-19 DIAGNOSIS — I4891 Unspecified atrial fibrillation: Secondary | ICD-10-CM | POA: Insufficient documentation

## 2021-02-19 DIAGNOSIS — M5442 Lumbago with sciatica, left side: Secondary | ICD-10-CM

## 2021-02-19 LAB — IRON AND TIBC
Iron: 121 ug/dL (ref 28–170)
Saturation Ratios: 31 % (ref 10.4–31.8)
TIBC: 393 ug/dL (ref 250–450)
UIBC: 272 ug/dL

## 2021-02-19 LAB — CBC
HCT: 40.2 % (ref 36.0–46.0)
Hemoglobin: 13.6 g/dL (ref 12.0–15.0)
MCH: 33.5 pg (ref 26.0–34.0)
MCHC: 33.8 g/dL (ref 30.0–36.0)
MCV: 99 fL (ref 80.0–100.0)
Platelets: 107 10*3/uL — ABNORMAL LOW (ref 150–400)
RBC: 4.06 MIL/uL (ref 3.87–5.11)
RDW: 13.5 % (ref 11.5–15.5)
WBC: 2.7 10*3/uL — ABNORMAL LOW (ref 4.0–10.5)
nRBC: 0 % (ref 0.0–0.2)

## 2021-02-19 LAB — FERRITIN: Ferritin: 264 ng/mL (ref 11–307)

## 2021-02-21 NOTE — Progress Notes (Signed)
Hematology/Oncology Consult note [facility]  Telephone:([phone] [phone]  Patient Care Team: Maryland Pink, MD as PCP - General (Family Medicine) Sindy Guadeloupe, MD as Consulting Physician (Hematology and Oncology)   Name of the patient: Tammy Arias  [id number]  Dec 25, 1955   Date of visit: 02/21/21  Diagnosis-iron deficiency anemia  Chief complaint/ Reason for visit-routine follow-up of iron deficiency anemia  Heme/Onc history: patient is a 65 year old Caucasian female with a past medical history significant for congestive heart failure with an EF of 35%, history of A. 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 Recent labs from 12/20/2018 showed white count of 4.1, H&H of 7.8/25.9 with an MCV of 84.1 and a platelet count of 135. Ferritin levels were low at 11. Iron studies showed elevated TIBC of 527 and low iron saturation of 4%. She underwent EGD and colonoscopy. EGD showed salmon-colored mucosa suspicious for short segment Barrett's. 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 Nonbleeding erosive gastropathy. Normal examined duodenum. No specimens collected.Colonoscopy showed 7 nonbleeding colonic angiectasia treated with APC. Nonbleeding internal hemorrhoids. 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  She does have mild chronic fatigue ECOG PS- 1 Pain scale- 0   Review of systems- Review of Systems  Constitutional: Positive for malaise/fatigue. Negative for chills, fever and weight loss.  HENT: Negative for congestion, ear discharge and nosebleeds.   Eyes: Negative for blurred vision.  Respiratory: Negative for cough, hemoptysis, sputum production, shortness of breath and wheezing.   Cardiovascular: Negative  for chest pain, palpitations, orthopnea and claudication.  Gastrointestinal: Negative for abdominal pain, blood in stool, constipation, diarrhea, heartburn, melena, nausea and vomiting.  Genitourinary: Negative for dysuria, flank pain, frequency, hematuria and urgency.  Musculoskeletal: Negative for back pain, joint pain and myalgias.  Skin: Negative for rash.  Neurological: Negative for dizziness, tingling, focal weakness, seizures, weakness and headaches.  Endo/Heme/Allergies: Does not bruise/bleed easily.  Psychiatric/Behavioral: Negative for depression and suicidal ideas. The patient does not have insomnia.       Allergies  Allergen Reactions  . Vioxx [Rofecoxib] Swelling     Past Medical History:  Diagnosis Date  . Anemia   . Arthritis   . Atrial fibrillation ([place])   . Cardiomyopathy ([place])   . CHF (congestive heart failure) ([place])   . Coronary artery disease   . Diabetes mellitus without complication ([place])   . Dyspnea    with exertion  . Dysrhythmia   . GERD (gastroesophageal reflux disease)   . Headache    migraines  . Heart murmur   . History of shingles 2004  . Hyperlipidemia   . Hypertension   . Hypothyroidism   . IBS (irritable bowel syndrome)   . TIA (transient ischemic attack)   . Vitamin B 12 deficiency      Past Surgical History:  Procedure Laterality Date  . CARDIAC CATHETERIZATION    . COLONOSCOPY N/A 12/19/2018   Procedure: COLONOSCOPY;  Surgeon: Virgel Manifold, MD;  Location: [facility] ENDOSCOPY;  Service: Endoscopy;  Laterality: N/A;  . COLONOSCOPY WITH PROPOFOL N/A 06/17/2020   Procedure: COLONOSCOPY WITH PROPOFOL;  Surgeon: Virgel Manifold, MD;  Location: [facility] ENDOSCOPY;  Service: Endoscopy;  Laterality: N/A;  . CORONARY ARTERY BYPASS GRAFT    . ESOPHAGOGASTRODUODENOSCOPY N/A  12/19/2018   Procedure: ESOPHAGOGASTRODUODENOSCOPY (EGD);  Surgeon: Virgel Manifold, MD;  Location: [facility] ENDOSCOPY;  Service: Endoscopy;  Laterality: N/A;  .  ESOPHAGOGASTRODUODENOSCOPY (EGD) WITH PROPOFOL N/A 06/17/2020   Procedure: ESOPHAGOGASTRODUODENOSCOPY (EGD) WITH PROPOFOL;  Surgeon: Virgel Manifold, MD;  Location: [facility] ENDOSCOPY;  Service: Endoscopy;  Laterality: N/A;  . EYE SURGERY Bilateral    Cataract Extraction with IOL  . RIGHT/LEFT HEART CATH AND CORONARY ANGIOGRAPHY Bilateral 03/29/2017   Procedure: Right/Left Heart Cath and Coronary Angiography;  Surgeon: Teodoro Spray, MD;  Location: [place] CV LAB;  Service: Cardiovascular;  Laterality: Bilateral;  . TUBAL LIGATION    . VENTRAL HERNIA REPAIR N/A 04/21/2017   Procedure: HERNIA REPAIR VENTRAL ADULT;  Surgeon: Leonie Green, MD;  Location: [facility] ORS;  Service: General;  Laterality: N/A;    Social History   Socioeconomic History  . Marital status: Widowed    Spouse name: Not on file  . Number of children: Not on file  . Years of education: Not on file  . Highest education level: Not on file  Occupational History  . Occupation: Automotive engineer  Tobacco Use  . Smoking status: Former Smoker    Types: Cigarettes    Quit date: 03/30/1999    Years since quitting: 21.9  . Smokeless tobacco: Never Used  Vaping Use  . Vaping Use: Never used  Substance and Sexual Activity  . Alcohol use: No  . Drug use: No  . Sexual activity: Not Currently  Other Topics Concern  . Not on file  Social History Narrative  . Not on file   Social Determinants of Health   Financial Resource Strain: Not on file  Food Insecurity: Not on file  Transportation Needs: Not on file  Physical Activity: Not on file  Stress: Not on file  Social Connections: Not on file  Intimate Partner Violence: Not on file    Family History  Problem Relation Age of Onset  . Cancer Sister        lung  . Cancer Sister        cervical  . Valvular heart disease Mother   . Diabetes Father   . Breast cancer Neg Hx      Current Outpatient Medications:  .  albuterol (PROVENTIL HFA;VENTOLIN  HFA) 108 (90 Base) MCG/ACT inhaler, Inhale 2 puffs into the lungs every 6 (six) hours as needed for wheezing or shortness of breath., Disp: , Rfl:  .  aspirin EC 81 MG tablet, Take 81 mg by mouth daily., Disp: , Rfl:  .  carvedilol (COREG) 3.125 MG tablet, Take 3.125 mg by mouth 2 (two) times daily with a meal., Disp: , Rfl:  .  Cyanocobalamin (VITAMIN B 12 PO), Take 1 tablet by mouth daily., Disp: , Rfl:  .  ENTRESTO 24-26 MG, Take 1 tablet by mouth 2 (two) times daily., Disp: , Rfl:  .  fluticasone (FLONASE) 50 MCG/ACT nasal spray, Place 1 spray into the nose. , Disp: , Rfl:  .  furosemide (LASIX) 40 MG tablet, Take 80 mg by mouth daily. , Disp: , Rfl:  .  latanoprost (XALATAN) 0.005 % ophthalmic solution, Place 1 drop into both eyes at bedtime., Disp: , Rfl:  .  levothyroxine (SYNTHROID, LEVOTHROID) 75 MCG tablet, Take 75 mcg by mouth daily before breakfast., Disp: , Rfl:  .  lovastatin (MEVACOR) 10 MG tablet, Take 10 mg by mouth at bedtime., Disp: , Rfl:  .  metFORMIN (GLUCOPHAGE) 1000 MG tablet, Take 1,000  mg by mouth 2 (two) times daily with a meal., Disp: , Rfl:  .  pantoprazole (PROTONIX) 40 MG tablet, Take 40 mg by mouth daily., Disp: , Rfl:  .  spironolactone (ALDACTONE) 25 MG tablet, Take 25 mg by mouth daily., Disp: , Rfl:  .  warfarin (COUMADIN) 3 MG tablet, Take 3 mg by mouth at bedtime. 3.5mg  M-W-F, 3mg  Tues, Thurs, Sat, Sun, Disp: , Rfl:  .  Misc Natural Products (NEURIVA) CAPS, Take by mouth. (Patient not taking: Reported on 02/19/2021), Disp: , Rfl:   Physical exam:  Vitals:   02/19/21 1040  BP: 95/67  Pulse: 80  Resp: 18  SpO2: 100%  Weight: 148 lb 8 oz (67.4 kg)   Physical Exam Constitutional:      General: She is not in acute distress. Eyes:     Pupils: Pupils are equal, round, and reactive to light.  Cardiovascular:     Rate and Rhythm: Normal rate and regular rhythm.     Heart sounds: Normal heart sounds.  Pulmonary:     Effort: Pulmonary effort is normal.      Breath sounds: Normal breath sounds.  Skin:    General: Skin is warm and dry.  Neurological:     Mental Status: She is alert and oriented to person, place, and time.      CMP Latest Ref Rng & Units 05/18/2020  Glucose 70 - 99 mg/dL -  BUN 8 - 23 mg/dL -  Creatinine 0.44 - 1.00 mg/dL -  Sodium 135 - 145 mmol/L -  Potassium 3.5 - 5.1 mmol/L -  Chloride 98 - 111 mmol/L -  CO2 22 - 32 mmol/L -  Calcium 8.9 - 10.3 mg/dL -  Total Protein 6.0 - 8.5 g/dL 6.8  Total Bilirubin 0.0 - 1.2 mg/dL <0.2  Alkaline Phos 48 - 121 IU/L 92  AST 0 - 40 IU/L 31  ALT 0 - 32 IU/L 24   CBC Latest Ref Rng & Units 02/19/2021  WBC 4.0 - 10.5 K/uL 2.7(L)  Hemoglobin 12.0 - 15.0 g/dL 13.6  Hematocrit 36.0 - 46.0 % 40.2  Platelets 150 - 400 K/uL 107(L)    Assessment and plan- Patient is a 64 y.o. female with history of iron deficiency anemia here for routine follow-up  Patient's hemoglobin is currently improved to 13.6/40.2 after she received IV iron in December 2021.  Her iron studies show normal ferritin of 264 with normal iron studies.  She does not require IV iron at this time.  Repeat CBC ferritin and iron studies in 3 in 6 months and I will see her back in 6 months   Visit Diagnosis 1. Iron deficiency anemia due to chronic blood loss      Dr. Randa Evens, MD, MPH [facility] [phone] 02/21/2021 9:40 PM

## 2021-03-10 ENCOUNTER — Other Ambulatory Visit: Payer: Self-pay

## 2021-03-10 ENCOUNTER — Ambulatory Visit
Admission: RE | Admit: 2021-03-10 | Discharge: 2021-03-10 | Disposition: A | Discharge status: Home / Self Care | Payer: Medicare Other | Source: Ambulatory Visit | Attending: Orthopedic Surgery | Admitting: Orthopedic Surgery

## 2021-03-10 DIAGNOSIS — M5442 Lumbago with sciatica, left side: Secondary | ICD-10-CM | POA: Diagnosis present

## 2021-03-22 ENCOUNTER — Inpatient Hospital Stay: Payer: Medicare Other | Attending: Oncology | Admitting: Oncology

## 2021-03-22 ENCOUNTER — Encounter: Payer: Self-pay | Admitting: Oncology

## 2021-03-22 VITALS — BP 110/63 | HR 87 | Temp 97.4°F | Resp 20 | Wt 147.7 lb

## 2021-03-22 DIAGNOSIS — D61818 Other pancytopenia: Secondary | ICD-10-CM | POA: Insufficient documentation

## 2021-03-22 DIAGNOSIS — I509 Heart failure, unspecified: Secondary | ICD-10-CM | POA: Diagnosis not present

## 2021-03-22 DIAGNOSIS — K7460 Unspecified cirrhosis of liver: Secondary | ICD-10-CM | POA: Diagnosis not present

## 2021-03-22 DIAGNOSIS — I4891 Unspecified atrial fibrillation: Secondary | ICD-10-CM | POA: Diagnosis not present

## 2021-03-22 DIAGNOSIS — D509 Iron deficiency anemia, unspecified: Secondary | ICD-10-CM | POA: Diagnosis not present

## 2021-03-22 DIAGNOSIS — D72819 Decreased white blood cell count, unspecified: Secondary | ICD-10-CM

## 2021-03-22 DIAGNOSIS — Z7901 Long term (current) use of anticoagulants: Secondary | ICD-10-CM | POA: Insufficient documentation

## 2021-03-22 DIAGNOSIS — Z87891 Personal history of nicotine dependence: Secondary | ICD-10-CM | POA: Insufficient documentation

## 2021-03-22 NOTE — Progress Notes (Signed)
Hematology/Oncology Consult note [facility]  [phone] [phone]  Patient Care Team: Maryland Pink, MD as PCP - General (Family Medicine) Sindy Guadeloupe, MD as Consulting Physician (Hematology and Oncology)   Name of the patient: Tammy Arias  [id number]  08-18-1956   Date of visit: 03/22/21  Diagnosis- 1.  Iron deficiency anemia 2.  Pancytopenia  Chief complaint/ Reason for visit-work-up of pancytopenia  Heme/Onc history: patient is a 65 year old Caucasian female with a past medical history significant for congestive heart failure with an EF of 35%, history of A. 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 EGD showed salmon-colored mucosa suspicious for short segment Barrett's. 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 Nonbleeding erosive gastropathy. Normal examined duodenum. No specimens collected.Colonoscopy showed 7 nonbleeding colonic angiectasia treated with APC. Nonbleeding internal hemorrhoids. 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  On a couple of occasions her white count has been normal at around 4.1.  Her white count was noted to be 2.4 on her recent blood work and prior to that a month ago it was 2.7.  Differential mainly shows lymphopenia with a neutrophil count that fluctuates between 1.8-3.7.  She is also developed gradual onset thrombocytopenia over the last 2 years and her platelet counts have gradually drifted down from 1 45-1 07 in March 2022.  More recently in April she was noted to have a platelet count of 98.  Interval  history-patient reports some ongoing fatigue.  She is working out at [employer] and intentionally trying to lose weight.  Denies any drenching night sweats  ECOG PS- 1 Pain scale- 0   Review of systems- Review of Systems  Constitutional: Positive for malaise/fatigue. Negative for chills, fever and weight loss.  HENT: Negative for congestion, ear discharge and nosebleeds.   Eyes: Negative for blurred vision.  Respiratory: Negative for cough, hemoptysis, sputum production, shortness of breath and wheezing.   Cardiovascular: Negative for chest pain, palpitations, orthopnea and claudication.  Gastrointestinal: Negative for abdominal pain, blood in stool, constipation, diarrhea, heartburn, melena, nausea and vomiting.  Genitourinary: Negative for dysuria, flank pain, frequency, hematuria and urgency.  Musculoskeletal: Negative for back pain, joint pain and myalgias.  Skin: Negative for rash.  Neurological: Negative for dizziness, tingling, focal weakness, seizures, weakness and headaches.  Endo/Heme/Allergies: Does not bruise/bleed easily.  Psychiatric/Behavioral: Negative for depression and suicidal ideas. The patient does not have insomnia.       Allergies  Allergen Reactions  . Vioxx [Rofecoxib] Swelling     Past Medical History:  Diagnosis Date  . Anemia   . Arthritis   . Atrial fibrillation ([place])   . Cardiomyopathy ([place])   . CHF (congestive heart failure) ([place])   . Coronary artery disease   . Diabetes mellitus without complication ([place])   . Dyspnea    with exertion  . Dysrhythmia   . GERD (gastroesophageal reflux disease)   . Headache    migraines  . Heart murmur   . History of shingles 2004  . Hyperlipidemia   . Hypertension   . Hypothyroidism   . IBS (irritable bowel syndrome)   .  TIA (transient ischemic attack)   . Vitamin B 12 deficiency      Past Surgical History:  Procedure Laterality Date  . CARDIAC CATHETERIZATION    . COLONOSCOPY N/A 12/19/2018    Procedure: COLONOSCOPY;  Surgeon: Virgel Manifold, MD;  Location: [facility] ENDOSCOPY;  Service: Endoscopy;  Laterality: N/A;  . COLONOSCOPY WITH PROPOFOL N/A 06/17/2020   Procedure: COLONOSCOPY WITH PROPOFOL;  Surgeon: Virgel Manifold, MD;  Location: [facility] ENDOSCOPY;  Service: Endoscopy;  Laterality: N/A;  . CORONARY ARTERY BYPASS GRAFT    . ESOPHAGOGASTRODUODENOSCOPY N/A 12/19/2018   Procedure: ESOPHAGOGASTRODUODENOSCOPY (EGD);  Surgeon: Virgel Manifold, MD;  Location: [facility] ENDOSCOPY;  Service: Endoscopy;  Laterality: N/A;  . ESOPHAGOGASTRODUODENOSCOPY (EGD) WITH PROPOFOL N/A 06/17/2020   Procedure: ESOPHAGOGASTRODUODENOSCOPY (EGD) WITH PROPOFOL;  Surgeon: Virgel Manifold, MD;  Location: [facility] ENDOSCOPY;  Service: Endoscopy;  Laterality: N/A;  . EYE SURGERY Bilateral    Cataract Extraction with IOL  . RIGHT/LEFT HEART CATH AND CORONARY ANGIOGRAPHY Bilateral 03/29/2017   Procedure: Right/Left Heart Cath and Coronary Angiography;  Surgeon: Teodoro Spray, MD;  Location: [place] CV LAB;  Service: Cardiovascular;  Laterality: Bilateral;  . TUBAL LIGATION    . VENTRAL HERNIA REPAIR N/A 04/21/2017   Procedure: HERNIA REPAIR VENTRAL ADULT;  Surgeon: Leonie Green, MD;  Location: [facility] ORS;  Service: General;  Laterality: N/A;    Social History   Socioeconomic History  . Marital status: Widowed    Spouse name: Not on file  . Number of children: Not on file  . Years of education: Not on file  . Highest education level: Not on file  Occupational History  . Occupation: Automotive engineer  Tobacco Use  . Smoking status: Former Smoker    Types: Cigarettes    Quit date: 03/30/1999    Years since quitting: 21.9  . Smokeless tobacco: Never Used  Vaping Use  . Vaping Use: Never used  Substance and Sexual Activity  . Alcohol use: No  . Drug use: No  . Sexual activity: Not Currently  Other Topics Concern  . Not on file  Social History Narrative  . Not on file    Social Determinants of Health   Financial Resource Strain: Not on file  Food Insecurity: Not on file  Transportation Needs: Not on file  Physical Activity: Not on file  Stress: Not on file  Social Connections: Not on file  Intimate Partner Violence: Not on file    Family History  Problem Relation Age of Onset  . Cancer Sister        lung  . Cancer Sister        cervical  . Valvular heart disease Mother   . Diabetes Father   . Breast cancer Neg Hx      Current Outpatient Medications:  .  albuterol (PROVENTIL HFA;VENTOLIN HFA) 108 (90 Base) MCG/ACT inhaler, Inhale 2 puffs into the lungs every 6 (six) hours as needed for wheezing or shortness of breath., Disp: , Rfl:  .  aspirin EC 81 MG tablet, Take 81 mg by mouth daily., Disp: , Rfl:  .  carvedilol (COREG) 3.125 MG tablet, Take 3.125 mg by mouth 2 (two) times daily with a meal., Disp: , Rfl:  .  Cyanocobalamin (VITAMIN B 12 PO), Take 1 tablet by mouth daily., Disp: , Rfl:  .  ENTRESTO 24-26 MG, Take 1 tablet by mouth 2 (two) times daily., Disp: , Rfl:  .  fluticasone (FLONASE) 50 MCG/ACT nasal spray, Place  1 spray into the nose. , Disp: , Rfl:  .  furosemide (LASIX) 40 MG tablet, Take 80 mg by mouth daily. , Disp: , Rfl:  .  latanoprost (XALATAN) 0.005 % ophthalmic solution, Place 1 drop into both eyes at bedtime., Disp: , Rfl:  .  levothyroxine (SYNTHROID, LEVOTHROID) 75 MCG tablet, Take 75 mcg by mouth daily before breakfast., Disp: , Rfl:  .  lovastatin (MEVACOR) 10 MG tablet, Take 10 mg by mouth at bedtime., Disp: , Rfl:  .  metFORMIN (GLUCOPHAGE) 1000 MG tablet, Take 1,000 mg by mouth 2 (two) times daily with a meal., Disp: , Rfl:  .  Misc Natural Products (NEURIVA) CAPS, Take by mouth. (Patient not taking: Reported on 02/19/2021), Disp: , Rfl:  .  pantoprazole (PROTONIX) 40 MG tablet, Take 40 mg by mouth daily., Disp: , Rfl:  .  spironolactone (ALDACTONE) 25 MG tablet, Take 25 mg by mouth daily., Disp: , Rfl:  .   warfarin (COUMADIN) 3 MG tablet, Take 3 mg by mouth at bedtime. 3.68m M-W-F, 362mTues, Thurs, Sat, Sun, Disp: , Rfl:   Physical exam:  Vitals:   03/22/21 1020  BP: 110/63  Pulse: 87  Resp: 20  Temp: (!) 97.4 F (36.3 C)  TempSrc: Tympanic  SpO2: 98%  Weight: 147 lb 11.2 oz (67 kg)   Physical Exam Constitutional:      General: She is not in acute distress. Cardiovascular:     Rate and Rhythm: Normal rate and regular rhythm.     Heart sounds: Normal heart sounds.  Pulmonary:     Effort: Pulmonary effort is normal.     Breath sounds: Normal breath sounds.  Abdominal:     General: Bowel sounds are normal.     Palpations: Abdomen is soft.     Comments: No palpable hepatosplenomegaly  Lymphadenopathy:     Comments: No palpable cervical, supraclavicular, axillary or inguinal adenopathy   Skin:    General: Skin is warm and dry.  Neurological:     Mental Status: She is alert and oriented to person, place, and time.      CMP Latest Ref Rng & Units 05/18/2020  Glucose 70 - 99 mg/dL -  BUN 8 - 23 mg/dL -  Creatinine 0.44 - 1.00 mg/dL -  Sodium 135 - 145 mmol/L -  Potassium 3.5 - 5.1 mmol/L -  Chloride 98 - 111 mmol/L -  CO2 22 - 32 mmol/L -  Calcium 8.9 - 10.3 mg/dL -  Total Protein 6.0 - 8.5 g/dL 6.8  Total Bilirubin 0.0 - 1.2 mg/dL <0.2  Alkaline Phos 48 - 121 IU/L 92  AST 0 - 40 IU/L 31  ALT 0 - 32 IU/L 24   CBC Latest Ref Rng & Units 02/19/2021  WBC 4.0 - 10.5 K/uL 2.7(L)  Hemoglobin 12.0 - 15.0 g/dL 13.6  Hematocrit 36.0 - 46.0 % 40.2  Platelets 150 - 400 K/uL 107(L)    No images are attached to the encounter.  MR LUMBAR SPINE WO CONTRAST  Result Date: 03/11/2021 CLINICAL DATA:  Low back pain radiating into the lateral left thigh. EXAM: MRI LUMBAR SPINE WITHOUT CONTRAST TECHNIQUE: Multiplanar, multisequence MR imaging of the lumbar spine was performed. No intravenous contrast was administered. COMPARISON:  CT abdomen and pelvis 12/17/2018 FINDINGS: Segmentation:   Standard. Alignment: Severe lumbar dextroscoliosis. Trace retrolisthesis of T12 on L1, L1 on L2, and L2 on L3. Grade 1 anterolisthesis of L4 on L5 and L5 on S1 measuring 3 mm each.  Vertebrae: No fracture or suspicious osseous lesion. Mild right-sided facet edema at L4-5 and L5-S1. Conus medullaris and cauda equina: Conus extends to the L1 level. Conus and cauda equina appear normal. Paraspinal and other soft tissues: Small gallstones. Subcentimeter left renal cyst. Disc levels: Disc desiccation throughout the lumbar and included lower thoracic spine. Moderate disc space narrowing at T12-L1 and L1-2. Mild right-sided disc space narrowing at L5-S1. T12-L1: Mild disc bulging and mild facet arthrosis without stenosis. L1-2: Mild disc bulging and mild right and moderate left facet arthrosis result in mild-to-moderate left lateral recess stenosis without spinal or neural foraminal stenosis. L2-3: Minimal disc bulging and moderate to severe facet arthrosis without stenosis. L3-4: Mild disc bulging and moderate right and severe left facet arthrosis without stenosis. L4-5: Anterolisthesis with bulging uncovered disc and severe facet arthrosis result in moderate spinal stenosis without significant neural foraminal stenosis. L5-S1: Anterolisthesis with bulging uncovered disc and severe facet arthrosis without stenosis. IMPRESSION: 1. Severe lumbar dextroscoliosis. 2. Severe lumbar facet arthrosis with grade 1 anterolisthesis at L4-5 and L5-S1. 3. Moderate spinal stenosis at L4-5. Electronically Signed   By: Logan Bores M.D.   On: 03/11/2021 08:04     Assessment and plan- Patient is a 65 y.o. female with history of iron deficiency anemia now referred for pancytopenia  Patient has had ongoing pancytopenia with a white count that fluctuates between 2.5-3.5 over the last 4 years.  Differential mainly shows lymphopenia.  However there has been a gradual downtrend in her white cell count. Over the last 1 year as well as  gradually worsening thrombocytopenia.  Most recent labs also showed some evidence of macrocytosis.  I will therefore proceed with a bone marrow biopsy to rule out any underlying MDS/leukemia or lymphoma.  I will also check a CBC with differential B12 folate ANA comprehensive panel and myeloma panel on that day.  If bone marrow biopsy does not reveal any evidence of primary bone marrow disorder and plan to monitor this conservatively as it could be autoimmune.  Counts are presently not sufficiently low enough to initiate any other treatment if bone marrow is normal  I will see her after the bone marrow biopsy results are back.  Discussed risks and benefits of bone marrow biopsy including all but not limited to possible risk of bleeding infection.  Patient understands and agrees to proceed as planned   Visit Diagnosis 1. Iron deficiency anemia, unspecified iron deficiency anemia type   2. Other pancytopenia ([place])      Dr. Randa Evens, MD, MPH [facility] [phone] 03/22/2021 12:53 PM

## 2021-03-22 NOTE — Addendum Note (Signed)
Addended by: Kern Alberta on: 03/22/2021 02:01 PM   Modules accepted: Orders

## 2021-03-24 ENCOUNTER — Telehealth: Payer: Self-pay | Admitting: *Deleted

## 2021-03-24 NOTE — Telephone Encounter (Signed)
Called pt to let her know that the bone marrow bx can be done on 5/4 with [facility] 7[facility], Nothing to eat or drink 8 hours prior so  That night before when you go to bed do not eat or drink anything until after the procedure done. If she has to take any pills juus drink just enough sips to get med down safely. Patient will need a driver. She is working on that and if she needs to change anything she will call. Then her appt with Janese Banks 4-5 days which is 5/9 at 9:30. Pt agreeable to this.

## 2021-03-29 NOTE — Progress Notes (Signed)
Patient on schedule for BMB 03/31/2021, called and spoke with patient on phone with pre procedure instructions given. Made aware to be here @ 0730, NPO after MN prior to procedure and driver for discharge post procedure/recovery. Stated understanding. Will also hold am dose metformin day of procedure.

## 2021-03-30 ENCOUNTER — Other Ambulatory Visit: Payer: Self-pay | Admitting: Student

## 2021-03-31 ENCOUNTER — Ambulatory Visit
Admission: RE | Admit: 2021-03-31 | Discharge: 2021-03-31 | Disposition: A | Discharge status: Home / Self Care | Payer: Medicare Other | Source: Ambulatory Visit | Attending: Oncology | Admitting: Oncology

## 2021-03-31 ENCOUNTER — Other Ambulatory Visit: Payer: Self-pay

## 2021-03-31 DIAGNOSIS — Z87891 Personal history of nicotine dependence: Secondary | ICD-10-CM | POA: Diagnosis not present

## 2021-03-31 DIAGNOSIS — M48061 Spinal stenosis, lumbar region without neurogenic claudication: Secondary | ICD-10-CM | POA: Insufficient documentation

## 2021-03-31 DIAGNOSIS — Z7901 Long term (current) use of anticoagulants: Secondary | ICD-10-CM | POA: Diagnosis not present

## 2021-03-31 DIAGNOSIS — Z951 Presence of aortocoronary bypass graft: Secondary | ICD-10-CM | POA: Diagnosis not present

## 2021-03-31 DIAGNOSIS — D7589 Other specified diseases of blood and blood-forming organs: Secondary | ICD-10-CM | POA: Insufficient documentation

## 2021-03-31 DIAGNOSIS — D72819 Decreased white blood cell count, unspecified: Secondary | ICD-10-CM

## 2021-03-31 DIAGNOSIS — Z79890 Hormone replacement therapy (postmenopausal): Secondary | ICD-10-CM | POA: Diagnosis not present

## 2021-03-31 DIAGNOSIS — D61818 Other pancytopenia: Secondary | ICD-10-CM | POA: Insufficient documentation

## 2021-03-31 DIAGNOSIS — Z7984 Long term (current) use of oral hypoglycemic drugs: Secondary | ICD-10-CM | POA: Insufficient documentation

## 2021-03-31 LAB — CBC WITH DIFFERENTIAL/PLATELET
Abs Immature Granulocytes: 0 10*3/uL (ref 0.00–0.07)
Basophils Absolute: 0 10*3/uL (ref 0.0–0.1)
Basophils Relative: 1 %
Eosinophils Absolute: 0.3 10*3/uL (ref 0.0–0.5)
Eosinophils Relative: 11 %
HCT: 37.5 % (ref 36.0–46.0)
Hemoglobin: 12.7 g/dL (ref 12.0–15.0)
Immature Granulocytes: 0 %
Lymphocytes Relative: 15 %
Lymphs Abs: 0.4 10*3/uL — ABNORMAL LOW (ref 0.7–4.0)
MCH: 34.1 pg — ABNORMAL HIGH (ref 26.0–34.0)
MCHC: 33.9 g/dL (ref 30.0–36.0)
MCV: 100.8 fL — ABNORMAL HIGH (ref 80.0–100.0)
Monocytes Absolute: 0.2 10*3/uL (ref 0.1–1.0)
Monocytes Relative: 9 %
Neutro Abs: 1.6 10*3/uL — ABNORMAL LOW (ref 1.7–7.7)
Neutrophils Relative %: 64 %
Platelets: 86 10*3/uL — ABNORMAL LOW (ref 150–400)
RBC: 3.72 MIL/uL — ABNORMAL LOW (ref 3.87–5.11)
RDW: 11.9 % (ref 11.5–15.5)
WBC: 2.5 10*3/uL — ABNORMAL LOW (ref 4.0–10.5)
nRBC: 0 % (ref 0.0–0.2)

## 2021-03-31 LAB — VITAMIN B12: Vitamin B-12: 1448 pg/mL — ABNORMAL HIGH (ref 180–914)

## 2021-03-31 LAB — GLUCOSE, CAPILLARY: Glucose-Capillary: 113 mg/dL — ABNORMAL HIGH (ref 70–99)

## 2021-03-31 LAB — FOLATE: Folate: 20.9 ng/mL (ref >5.9)

## 2021-03-31 MED ORDER — FENTANYL CITRATE (PF) 100 MCG/2ML IJ SOLN
INTRAMUSCULAR | Status: AC | PRN
  Administered 2021-03-31: 50 ug via INTRAVENOUS

## 2021-03-31 MED ORDER — HEPARIN SOD (PORK) LOCK FLUSH 100 UNIT/ML IV SOLN
INTRAVENOUS | Status: AC
  Filled 2021-03-31: qty 5

## 2021-03-31 MED ORDER — SODIUM CHLORIDE 0.9 % IV SOLN: INTRAVENOUS | Status: DC

## 2021-03-31 MED ORDER — FENTANYL CITRATE (PF) 100 MCG/2ML IJ SOLN
INTRAMUSCULAR | Status: AC
  Filled 2021-03-31: qty 2

## 2021-03-31 MED ORDER — MIDAZOLAM HCL 2 MG/2ML IJ SOLN
INTRAMUSCULAR | Status: AC | PRN
  Administered 2021-03-31: 1 mg via INTRAVENOUS

## 2021-03-31 MED ORDER — MIDAZOLAM HCL 2 MG/2ML IJ SOLN
INTRAMUSCULAR | Status: AC
  Filled 2021-03-31: qty 2

## 2021-03-31 NOTE — Procedures (Signed)
Interventional Radiology Procedure Note  Procedure: CT guided aspirate and core biopsy of left posterior iliac bone Complications: None Recommendations: - Bedrest supine x 1 hrs - OTC's PRN  Pain - Follow biopsy results  Signed,  Armonie Staten S. Lai Hendriks, DO    

## 2021-03-31 NOTE — H&P (Signed)
Chief Complaint: Patient was seen in consultation today for No chief complaint on file.  at the request of [place] [facility]  Referring Physician(s): Sindy Guadeloupe  Supervising Physician: Corrie Mckusick  Patient Status: [facility] - Out-pt  History of Present Illness: Tammy Arias is a 65 y.o. female presenting today for image guided bone marrow biopsy, with history of pancytopenia.   She reports no new symptoms.   She did request a left sided iliac bone biopsy, if reasonable when we start.    Past Medical History:  Diagnosis Date  . Anemia   . Arthritis   . Atrial fibrillation ([place])   . Cardiomyopathy ([place])   . CHF (congestive heart failure) ([place])   . Coronary artery disease   . Diabetes mellitus without complication ([place])   . Dyspnea    with exertion  . Dysrhythmia   . GERD (gastroesophageal reflux disease)   . Headache    migraines  . Heart murmur   . History of shingles 2004  . Hyperlipidemia   . Hypertension   . Hypothyroidism   . IBS (irritable bowel syndrome)   . TIA (transient ischemic attack)   . Vitamin B 12 deficiency     Past Surgical History:  Procedure Laterality Date  . CARDIAC CATHETERIZATION    . COLONOSCOPY N/A 12/19/2018   Procedure: COLONOSCOPY;  Surgeon: Virgel Manifold, MD;  Location: [facility] ENDOSCOPY;  Service: Endoscopy;  Laterality: N/A;  . COLONOSCOPY WITH PROPOFOL N/A 06/17/2020   Procedure: COLONOSCOPY WITH PROPOFOL;  Surgeon: Virgel Manifold, MD;  Location: [facility] ENDOSCOPY;  Service: Endoscopy;  Laterality: N/A;  . CORONARY ARTERY BYPASS GRAFT    . ESOPHAGOGASTRODUODENOSCOPY N/A 12/19/2018   Procedure: ESOPHAGOGASTRODUODENOSCOPY (EGD);  Surgeon: Virgel Manifold, MD;  Location[facility] ENDOSCOPY;  Service: Endoscopy;  Laterality: N/A;  . ESOPHAGOGASTRODUODENOSCOPY (EGD) WITH PROPOFOL N/A 06/17/2020   Procedure: ESOPHAGOGASTRODUODENOSCOPY (EGD) WITH PROPOFOL;  Surgeon: Virgel Manifold, MD;  Location: [facility] ENDOSCOPY;  Service:  Endoscopy;  Laterality: N/A;  . EYE SURGERY Bilateral    Cataract Extraction with IOL  . RIGHT/LEFT HEART CATH AND CORONARY ANGIOGRAPHY Bilateral 03/29/2017   Procedure: Right/Left Heart Cath and Coronary Angiography;  Surgeon: Teodoro Spray, MD;  Location: [facility] CV LAB;  Service: Cardiovascular;  Laterality: Bilateral;  . TUBAL LIGATION    . VENTRAL HERNIA REPAIR N/A 04/21/2017   Procedure: HERNIA REPAIR VENTRAL ADULT;  Surgeon: Leonie Green, MD;  Location: [facility] ORS;  Service: General;  Laterality: N/A;    Allergies: Vioxx [rofecoxib]  Medications: Prior to Admission medications   Medication Sig Start Date End Date Taking? Authorizing Provider  aspirin EC 81 MG tablet Take 81 mg by mouth daily.   Yes [provider]  carvedilol (COREG) 3.125 MG tablet Take 3.125 mg by mouth 2 (two) times daily with a meal.   Yes [provider]  Cyanocobalamin (VITAMIN B 12 PO) Take 1 tablet by mouth daily.   Yes [provider]  ENTRESTO 24-26 MG Take 1 tablet by mouth 2 (two) times daily. 01/27/21  Yes [provider]  fluticasone (FLONASE) 50 MCG/ACT nasal spray Place 1 spray into the nose.  02/05/19 02/19/22 Yes [provider]  furosemide (LASIX) 40 MG tablet Take 80 mg by mouth daily.    Yes [provider]  latanoprost (XALATAN) 0.005 % ophthalmic solution Place 1 drop into both eyes at bedtime.   Yes [provider]  levothyroxine (SYNTHROID, LEVOTHROID) 75 MCG tablet Take 75 mcg by  mouth daily before breakfast.   Yes [provider]  lovastatin (MEVACOR) 10 MG tablet Take 10 mg by mouth at bedtime.   Yes [provider]  metFORMIN (GLUCOPHAGE) 1000 MG tablet Take 1,000 mg by mouth 2 (two) times daily with a meal.   Yes [provider]  pantoprazole (PROTONIX) 40 MG tablet Take 40 mg by mouth daily. 01/28/21  Yes [provider]  spironolactone (ALDACTONE) 25 MG tablet Take 25 mg by mouth  daily.   Yes [provider]  warfarin (COUMADIN) 3 MG tablet Take 3 mg by mouth at bedtime. 3.36m M-W-F, 336mTues, Thurs, Sat, Sun   Yes [provider]  albuterol (PROVENTIL HFA;VENTOLIN HFA) 108 (90 Base) MCG/ACT inhaler Inhale 2 puffs into the lungs every 6 (six) hours as needed for wheezing or shortness of breath.    [provider]  Misc Natural Products (NEURIVA) CAPS Take by mouth. Patient not taking: Reported on 02/19/2021    [provider]     Family History  Problem Relation Age of Onset  . Cancer Sister        lung  . Cancer Sister        cervical  . Valvular heart disease Mother   . Diabetes Father   . Breast cancer Neg Hx     Social History   Socioeconomic History  . Marital status: Widowed    Spouse name: Not on file  . Number of children: Not on file  . Years of education: Not on file  . Highest education level: Not on file  Occupational History  . Occupation: ceAutomotive engineerTobacco Use  . Smoking status: Former Smoker    Types: Cigarettes    Quit date: 03/30/1999    Years since quitting: 22.0  . Smokeless tobacco: Never Used  Vaping Use  . Vaping Use: Never used  Substance and Sexual Activity  . Alcohol use: No  . Drug use: No  . Sexual activity: Not Currently  Other Topics Concern  . Not on file  Social History Narrative  . Not on file   Social Determinants of Health   Financial Resource Strain: Not on file  Food Insecurity: Not on file  Transportation Needs: Not on file  Physical Activity: Not on file  Stress: Not on file  Social Connections: Not on file       Review of Systems: A 12 point ROS discussed and pertinent positives are indicated in the HPI above.  All other systems are negative.  Review of Systems  Vital Signs: BP (!) 119/57   Pulse 74   Temp 97.8 F (36.6 C) (Oral)   Resp 14   Ht 5' 1"  (1.549 m)   Wt 67.1 kg   SpO2 98%   BMI 27.96 kg/m   Physical Exam General: 65yo female appearing stated age.  Well-developed, well-nourished.  No distress. HEENT: Atraumatic, normocephalic.  Conjugate gaze, extra-ocular motor intact. No scleral icterus or scleral injection. No lesions on external ears, nose, lips, or gums.  Oral mucosa moist, pink.  Neck: Symmetric with no goiter enlargement.  Chest/Lungs:  Symmetric chest with inspiration/expiration.  No labored breathing.  Clear to auscultation with no wheezes, rhonchi, or rales.  Heart:  RRR, with no third heart sounds appreciated. No JVD appreciated.  Abdomen:  Soft, NT/ND, with + bowel sounds.   Genito-urinary: Deferred Neurologic: Alert & Oriented to person, place, and time.   Normal affect and insight.  Appropriate questions.  Moving all 4 extremities with gross sensory intact.      Imaging: MR LUMBAR SPINE WO CONTRAST  Result Date: 03/11/2021 CLINICAL DATA:  Low back pain radiating into the lateral left thigh. EXAM: MRI LUMBAR SPINE WITHOUT CONTRAST TECHNIQUE: Multiplanar, multisequence MR imaging of the lumbar spine was performed. No intravenous contrast was administered. COMPARISON:  CT abdomen and pelvis 12/17/2018 FINDINGS: Segmentation:  Standard. Alignment: Severe lumbar dextroscoliosis. Trace retrolisthesis of T12 on L1, L1 on L2, and L2 on L3. Grade 1 anterolisthesis of L4 on L5 and L5 on S1 measuring 3 mm each. Vertebrae: No fracture or suspicious osseous lesion. Mild right-sided facet edema at L4-5 and L5-S1. Conus medullaris and cauda equina: Conus extends to the L1 level. Conus and cauda equina appear normal. Paraspinal and other soft tissues: Small gallstones. Subcentimeter left renal cyst. Disc levels: Disc desiccation throughout the lumbar and included lower thoracic spine. Moderate disc space narrowing at T12-L1 and L1-2. Mild right-sided disc space narrowing at L5-S1. T12-L1: Mild disc bulging and mild facet arthrosis without stenosis. L1-2: Mild disc bulging and mild right and moderate left facet  arthrosis result in mild-to-moderate left lateral recess stenosis without spinal or neural foraminal stenosis. L2-3: Minimal disc bulging and moderate to severe facet arthrosis without stenosis. L3-4: Mild disc bulging and moderate right and severe left facet arthrosis without stenosis. L4-5: Anterolisthesis with bulging uncovered disc and severe facet arthrosis result in moderate spinal stenosis without significant neural foraminal stenosis. L5-S1: Anterolisthesis with bulging uncovered disc and severe facet arthrosis without stenosis. IMPRESSION: 1. Severe lumbar dextroscoliosis. 2. Severe lumbar facet arthrosis with grade 1 anterolisthesis at L4-5 and L5-S1. 3. Moderate spinal stenosis at L4-5. Electronically Signed   By: Logan Bores M.D.   On: 03/11/2021 08:04    Labs:  CBC: Recent Labs    06/16/20 0914 10/16/20 0958 02/19/21 1016  WBC 4.6 3.0* 2.7*  HGB 12.4 10.1* 13.6  HCT 35.5* 31.3* 40.2  PLT 128* 110* 107*    COAGS: No results for input(s): INR, APTT in the last 8760 hours.  BMP: No results for input(s): NA, K, CL, CO2, GLUCOSE, BUN, CALCIUM, CREATININE, GFRNONAA, GFRAA in the last 8760 hours.  Invalid input(s): CMP  LIVER FUNCTION TESTS: Recent Labs    05/18/20 1418  BILITOT <0.2  AST 31  ALT 24  ALKPHOS 92  PROT 6.8  ALBUMIN 4.7    TUMOR MARKERS: No results for input(s): AFPTM, CEA, CA199, CHROMGRNA in the last 8760 hours.  Assessment and Plan:  Ms Virrueta is a 65 yo female presenting for image guided bone marrow biopsy, with pancytopenia.   Risks and benefits of bone marrow biopsy was discussed with the patient and/or patient's family including, but not limited to bleeding, infection, damage to adjacent structures or low yield requiring additional tests.  All of the questions were answered and there is agreement to proceed.  Consent signed and in chart.    Thank you for this interesting consult.  I greatly enjoyed meeting ZOHRA CLAVEL and look  forward to participating in their care.  A copy of this report was sent to the requesting provider on this date.  Electronically Signed: Corrie Mckusick, DO 03/31/2021, 8:41 AM   I spent a total of  15 Minutes   in face to face in clinical consultation, greater than 50% of which was counseling/coordinating care for image guided bone marrow biopsy.

## 2021-04-01 LAB — ANA COMPREHENSIVE PANEL

## 2021-04-03 LAB — MULTIPLE MYELOMA PANEL, SERUM
Albumin SerPl Elph-Mcnc: 4.1 g/dL (ref 2.9–4.4)
Albumin/Glob SerPl: 1.6 (ref 0.7–1.7)
Alpha 1: 0.2 g/dL (ref 0.0–0.4)
Alpha2 Glob SerPl Elph-Mcnc: 0.7 g/dL (ref 0.4–1.0)
B-Globulin SerPl Elph-Mcnc: 1 g/dL (ref 0.7–1.3)
Gamma Glob SerPl Elph-Mcnc: 0.7 g/dL (ref 0.4–1.8)
Globulin, Total: 2.7 g/dL (ref 2.2–3.9)
IgA: 207 mg/dL (ref 87–352)
IgG (Immunoglobin G), Serum: 774 mg/dL (ref 586–1602)
IgM (Immunoglobulin M), Srm: 99 mg/dL (ref 26–217)
Total Protein ELP: 6.8 g/dL (ref 6.0–8.5)

## 2021-04-05 ENCOUNTER — Encounter: Payer: Self-pay | Admitting: Oncology

## 2021-04-05 ENCOUNTER — Inpatient Hospital Stay: Payer: Medicare Other | Attending: Oncology | Admitting: Oncology

## 2021-04-05 VITALS — BP 112/58 | HR 82 | Temp 96.0°F | Resp 18 | Wt 148.0 lb

## 2021-04-05 DIAGNOSIS — Z8049 Family history of malignant neoplasm of other genital organs: Secondary | ICD-10-CM | POA: Insufficient documentation

## 2021-04-05 DIAGNOSIS — D509 Iron deficiency anemia, unspecified: Secondary | ICD-10-CM

## 2021-04-05 DIAGNOSIS — D61818 Other pancytopenia: Secondary | ICD-10-CM | POA: Diagnosis not present

## 2021-04-05 DIAGNOSIS — Z87891 Personal history of nicotine dependence: Secondary | ICD-10-CM | POA: Diagnosis not present

## 2021-04-05 DIAGNOSIS — Z801 Family history of malignant neoplasm of trachea, bronchus and lung: Secondary | ICD-10-CM | POA: Insufficient documentation

## 2021-04-05 NOTE — Progress Notes (Signed)
Patient here for oncology follow-up appointment, expresses no complaints or concerns at this time.    

## 2021-04-05 NOTE — Progress Notes (Signed)
Hematology/Oncology Consult note [facility]  [phone] [phone]  Patient Care Team: Maryland Pink, MD as PCP - General (Family Medicine) Sindy Guadeloupe, MD as Consulting Physician (Hematology and Oncology)   Name of the patient: Tammy Arias  [id number]  [birth year]   Date of visit: 04/05/21  Diagnosis- 1.  Iron deficiency anemia 2.  Pancytopenia  Chief complaint/ Reason for visit-discuss bone marrow biopsy results and further management  Heme/Onc history: patient is a 65 year old Caucasian female with a past medical history significant for congestive heart failure with an EF of 35%, history of A. 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 EGD showed salmon-colored mucosa suspicious for short segment Barrett's. 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 Nonbleeding erosive gastropathy. Normal examined duodenum. No specimens collected.Colonoscopy showed 7 nonbleeding colonic angiectasia treated with APC. Nonbleeding internal hemorrhoids. 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  On a couple of occasions her white count has been normal at around 4.1.  Her white count was noted to be 2.4 on her recent blood work and prior to that a month ago it was 2.7.  Differential mainly shows lymphopenia with a neutrophil count that fluctuates between 1.8-3.7.  She is also developed gradual onset thrombocytopenia over the last 2 years and her platelet counts have gradually drifted down from 1 45-1 07 in March 2022.  More recently in April she was noted to have a platelet  count of 98.  Bone marrow biopsy showed slightly hypercellular marrow with trilineage-hematopoiesis with abundant megakaryocytes with normal morphology.  Generally mild nonspecific changes involving the myeloid cell lines with no increase in blasts.  Cytogenetics and FISH studies currently pending.  Myeloma panel, B12, folate unremarkable.  ANA comprehensive panel negative.   Interval history- patient reports chronic low back pain and pain in her bilateral knees. Denies any new complaints at this time. No new infections  ECOG PS- 1 Pain scale- 3 Opioid associated constipation- no  Review of systems- Review of Systems  Constitutional: Negative for chills, fever, malaise/fatigue and weight loss.  HENT: Negative for congestion, ear discharge and nosebleeds.   Eyes: Negative for blurred vision.  Respiratory: Negative for cough, hemoptysis, sputum production, shortness of breath and wheezing.   Cardiovascular: Negative for chest pain, palpitations, orthopnea and claudication.  Gastrointestinal: Negative for abdominal pain, blood in stool, constipation, diarrhea, heartburn, melena, nausea and vomiting.  Genitourinary: Negative for dysuria, flank pain, frequency, hematuria and urgency.  Musculoskeletal: Positive for back pain and joint pain. Negative for myalgias.  Skin: Negative for rash.  Neurological: Negative for dizziness, tingling, focal weakness, seizures, weakness and headaches.  Endo/Heme/Allergies: Does not bruise/bleed easily.  Psychiatric/Behavioral: Negative for depression and suicidal ideas. The patient does not have insomnia.        Allergies  Allergen Reactions  . Vioxx [Rofecoxib] Swelling     Past Medical History:  Diagnosis Date  . Anemia   . Arthritis   . Atrial fibrillation ([place])   . Cardiomyopathy ([place])   . CHF (congestive heart failure) ([place])   . Coronary artery disease   . Diabetes mellitus without complication ([place])   .  Dyspnea    with exertion  .  Dysrhythmia   . GERD (gastroesophageal reflux disease)   . Headache    migraines  . Heart murmur   . History of shingles 2004  . Hyperlipidemia   . Hypertension   . Hypothyroidism   . IBS (irritable bowel syndrome)   . TIA (transient ischemic attack)   . Vitamin B 12 deficiency      Past Surgical History:  Procedure Laterality Date  . CARDIAC CATHETERIZATION    . COLONOSCOPY N/A 12/19/2018   Procedure: COLONOSCOPY;  Surgeon: Virgel Manifold, MD;  Location[facility] ENDOSCOPY;  Service: Endoscopy;  Laterality: N/A;  . COLONOSCOPY WITH PROPOFOL N/A 06/17/2020   Procedure: COLONOSCOPY WITH PROPOFOL;  Surgeon: Virgel Manifold, MD;  Location: [facility] ENDOSCOPY;  Service: Endoscopy;  Laterality: N/A;  . CORONARY ARTERY BYPASS GRAFT    . ESOPHAGOGASTRODUODENOSCOPY N/A 12/19/2018   Procedure: ESOPHAGOGASTRODUODENOSCOPY (EGD);  Surgeon: Virgel Manifold, MD;  Location: [facility] ENDOSCOPY;  Service: Endoscopy;  Laterality: N/A;  . ESOPHAGOGASTRODUODENOSCOPY (EGD) WITH PROPOFOL N/A 06/17/2020   Procedure: ESOPHAGOGASTRODUODENOSCOPY (EGD) WITH PROPOFOL;  Surgeon: Virgel Manifold, MD;  Location: [facility] ENDOSCOPY;  Service: Endoscopy;  Laterality: N/A;  . EYE SURGERY Bilateral    Cataract Extraction with IOL  . RIGHT/LEFT HEART CATH AND CORONARY ANGIOGRAPHY Bilateral 03/29/2017   Procedure: Right/Left Heart Cath and Coronary Angiography;  Surgeon: Teodoro Spray, MD;  Location: [facility] CV LAB;  Service: Cardiovascular;  Laterality: Bilateral;  . TUBAL LIGATION    . VENTRAL HERNIA REPAIR N/A 04/21/2017   Procedure: HERNIA REPAIR VENTRAL ADULT;  Surgeon: Leonie Green, MD;  Location: [facility] ORS;  Service: General;  Laterality: N/A;    Social History   Socioeconomic History  . Marital status: Widowed    Spouse name: Not on file  . Number of children: Not on file  . Years of education: Not on file  . Highest education level: Not on file  Occupational History  . Occupation:  Automotive engineer  Tobacco Use  . Smoking status: Former Smoker    Types: Cigarettes    Quit date: 03/30/1999    Years since quitting: 22.0  . Smokeless tobacco: Never Used  Vaping Use  . Vaping Use: Never used  Substance and Sexual Activity  . Alcohol use: No  . Drug use: No  . Sexual activity: Not Currently  Other Topics Concern  . Not on file  Social History Narrative  . Not on file   Social Determinants of Health   Financial Resource Strain: Not on file  Food Insecurity: Not on file  Transportation Needs: Not on file  Physical Activity: Not on file  Stress: Not on file  Social Connections: Not on file  Intimate Partner Violence: Not on file    Family History  Problem Relation Age of Onset  . Cancer Sister        lung  . Cancer Sister        cervical  . Valvular heart disease Mother   . Diabetes Father   . Breast cancer Neg Hx      Current Outpatient Medications:  .  albuterol (PROVENTIL HFA;VENTOLIN HFA) 108 (90 Base) MCG/ACT inhaler, Inhale 2 puffs into the lungs every 6 (six) hours as needed for wheezing or shortness of breath., Disp: , Rfl:  .  aspirin EC 81 MG tablet, Take 81 mg by mouth daily., Disp: , Rfl:  .  carvedilol (COREG) 3.125 MG tablet, Take 3.125 mg by  mouth 2 (two) times daily with a meal., Disp: , Rfl:  .  Cyanocobalamin (VITAMIN B 12 PO), Take 1 tablet by mouth daily., Disp: , Rfl:  .  ENTRESTO 24-26 MG, Take 1 tablet by mouth 2 (two) times daily., Disp: , Rfl:  .  fluticasone (FLONASE) 50 MCG/ACT nasal spray, Place 1 spray into the nose. , Disp: , Rfl:  .  furosemide (LASIX) 40 MG tablet, Take 80 mg by mouth daily. , Disp: , Rfl:  .  latanoprost (XALATAN) 0.005 % ophthalmic solution, Place 1 drop into both eyes at bedtime., Disp: , Rfl:  .  levothyroxine (SYNTHROID, LEVOTHROID) 75 MCG tablet, Take 75 mcg by mouth daily before breakfast., Disp: , Rfl:  .  lovastatin (MEVACOR) 10 MG tablet, Take 10 mg by mouth at bedtime., Disp: , Rfl:   .  metFORMIN (GLUCOPHAGE) 1000 MG tablet, Take 1,000 mg by mouth 2 (two) times daily with a meal., Disp: , Rfl:  .  Misc Natural Products (NEURIVA) CAPS, Take by mouth. (Patient not taking: Reported on 02/19/2021), Disp: , Rfl:  .  pantoprazole (PROTONIX) 40 MG tablet, Take 40 mg by mouth daily., Disp: , Rfl:  .  spironolactone (ALDACTONE) 25 MG tablet, Take 25 mg by mouth daily., Disp: , Rfl:  .  warfarin (COUMADIN) 3 MG tablet, Take 3 mg by mouth at bedtime. 3.73m M-W-F, 342mTues, Thurs, Sat, Sun, Disp: , Rfl:   Physical exam: There were no vitals filed for this visit. Physical Exam Cardiovascular:     Rate and Rhythm: Normal rate and regular rhythm.     Heart sounds: Normal heart sounds.  Pulmonary:     Effort: Pulmonary effort is normal.     Breath sounds: Normal breath sounds.  Abdominal:     General: Bowel sounds are normal.     Palpations: Abdomen is soft.  Lymphadenopathy:     Comments: No palpable cervical, supraclavicular, axillary or inguinal adenopathy   Skin:    General: Skin is warm and dry.  Neurological:     Mental Status: She is alert and oriented to person, place, and time.      CMP Latest Ref Rng & Units 05/18/2020  Glucose 70 - 99 mg/dL -  BUN 8 - 23 mg/dL -  Creatinine 0.44 - 1.00 mg/dL -  Sodium 135 - 145 mmol/L -  Potassium 3.5 - 5.1 mmol/L -  Chloride 98 - 111 mmol/L -  CO2 22 - 32 mmol/L -  Calcium 8.9 - 10.3 mg/dL -  Total Protein 6.0 - 8.5 g/dL 6.8  Total Bilirubin 0.0 - 1.2 mg/dL <0.2  Alkaline Phos 48 - 121 IU/L 92  AST 0 - 40 IU/L 31  ALT 0 - 32 IU/L 24   CBC Latest Ref Rng & Units 03/31/2021  WBC 4.0 - 10.5 K/uL 2.5(L)  Hemoglobin 12.0 - 15.0 g/dL 12.7  Hematocrit 36.0 - 46.0 % 37.5  Platelets 150 - 400 K/uL 86(L)    No images are attached to the encounter.  MR LUMBAR SPINE WO CONTRAST  Result Date: 03/11/2021 CLINICAL DATA:  Low back pain radiating into the lateral left thigh. EXAM: MRI LUMBAR SPINE WITHOUT CONTRAST TECHNIQUE:  Multiplanar, multisequence MR imaging of the lumbar spine was performed. No intravenous contrast was administered. COMPARISON:  CT abdomen and pelvis 12/17/2018 FINDINGS: Segmentation:  Standard. Alignment: Severe lumbar dextroscoliosis. Trace retrolisthesis of T12 on L1, L1 on L2, and L2 on L3. Grade 1 anterolisthesis of L4 on L5 and L5 on  S1 measuring 3 mm each. Vertebrae: No fracture or suspicious osseous lesion. Mild right-sided facet edema at L4-5 and L5-S1. Conus medullaris and cauda equina: Conus extends to the L1 level. Conus and cauda equina appear normal. Paraspinal and other soft tissues: Small gallstones. Subcentimeter left renal cyst. Disc levels: Disc desiccation throughout the lumbar and included lower thoracic spine. Moderate disc space narrowing at T12-L1 and L1-2. Mild right-sided disc space narrowing at L5-S1. T12-L1: Mild disc bulging and mild facet arthrosis without stenosis. L1-2: Mild disc bulging and mild right and moderate left facet arthrosis result in mild-to-moderate left lateral recess stenosis without spinal or neural foraminal stenosis. L2-3: Minimal disc bulging and moderate to severe facet arthrosis without stenosis. L3-4: Mild disc bulging and moderate right and severe left facet arthrosis without stenosis. L4-5: Anterolisthesis with bulging uncovered disc and severe facet arthrosis result in moderate spinal stenosis without significant neural foraminal stenosis. L5-S1: Anterolisthesis with bulging uncovered disc and severe facet arthrosis without stenosis. IMPRESSION: 1. Severe lumbar dextroscoliosis. 2. Severe lumbar facet arthrosis with grade 1 anterolisthesis at L4-5 and L5-S1. 3. Moderate spinal stenosis at L4-5. Electronically Signed   By: Logan Bores M.D.   On: 03/11/2021 08:04   CT BONE MARROW BIOPSY & ASPIRATION  Result Date: 03/31/2021 INDICATION: 65 year old female referred for bone marrow biopsy, pancytopenia EXAM: CT BONE MARROW BIOPSY AND ASPIRATION MEDICATIONS:  None. ANESTHESIA/SEDATION: Moderate (conscious) sedation was employed during this procedure. A total of Versed 1.0 mg and Fentanyl 50 mcg was administered intravenously. Moderate Sedation Time: 15 minutes. The patient's level of consciousness and vital signs were monitored continuously by radiology nursing throughout the procedure under my direct supervision. FLUOROSCOPY TIME:  CT COMPLICATIONS: None PROCEDURE: The procedure risks, benefits, and alternatives were explained to the patient. Questions regarding the procedure were encouraged and answered. The patient understands and consents to the procedure. Scout CT of the pelvis was performed for surgical planning purposes. The left posterior pelvis was prepped with Chlorhexidine in a sterile fashion, and a sterile drape was applied covering the operative field. A sterile gown and sterile gloves were used for the procedure. Local anesthesia was provided with 1% Lidocaine. Posterior left iliac bone was targeted for biopsy. The skin and subcutaneous tissues were infiltrated with 1% lidocaine without epinephrine. A small stab incision was made with an 11 blade scalpel, and an 11 gauge Murphy needle was advanced with CT guidance to the posterior cortex. Manual forced was used to advance the needle through the posterior cortex and the stylet was removed. A bone marrow aspirate was retrieved and passed to a cytotechnologist in the room. The Murphy needle was then advanced without the stylet for a core biopsy. The core biopsy was retrieved and also passed to a cytotechnologist. Manual pressure was used for hemostasis and a sterile dressing was placed. No complications were encountered no significant blood loss was encountered. Patient tolerated the procedure well and remained hemodynamically stable throughout. IMPRESSION: Status post CT-guided bone marrow biopsy, with tissue specimen sent to pathology for complete histopathologic analysis Signed, Dulcy Fanny. Earleen Newport, DO  [facility] Electronically Signed   By: Corrie Mckusick D.O.   On: 03/31/2021 09:58     Assessment and plan- Patient is a 65 y.o. 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  Bone marrow is slightly hypercellular for age with trilineage hematopoiesis.  There is  no conclusive findings suggestive of dysplasia lymphoma or leukemia.  FISH and cytogenetics are currently pending.  Also her autoimmune work-up including an ANA comprehensive panel was unremarkable.  The etiology of her pancytopenia predominantly neutropenia anemia and thrombocytopenia is presently unclear.  She has had a mildly low platelet count since January 2020 but it has trended down to 86 most recently from a prior value of 140.  Her white count has gone down to 2.5 in the past but did come back to normal and now again trending down.  At this time I am inclined to monitor this conservatively.  If her neutropenia worsens we could consider treating her as an autoimmune cause with Rituxan.  Repeat CBC with differential in June and I will see her back in September as planned    Visit Diagnosis 1. Iron deficiency anemia, unspecified iron deficiency anemia type   2. Other pancytopenia (HCC)      Dr. Archana Rao, MD, MPH [facility] [phone] 04/05/2021 9:19 AM                

## 2021-04-07 ENCOUNTER — Encounter (HOSPITAL_COMMUNITY): Payer: Self-pay | Admitting: Oncology

## 2021-04-09 ENCOUNTER — Encounter (HOSPITAL_COMMUNITY): Payer: Self-pay | Admitting: Oncology

## 2021-04-29 LAB — SURGICAL PATHOLOGY

## 2021-05-21 ENCOUNTER — Other Ambulatory Visit: Payer: Self-pay

## 2021-05-21 ENCOUNTER — Inpatient Hospital Stay: Payer: Medicare Other | Attending: Oncology

## 2021-05-21 DIAGNOSIS — D509 Iron deficiency anemia, unspecified: Secondary | ICD-10-CM | POA: Insufficient documentation

## 2021-05-21 DIAGNOSIS — D500 Iron deficiency anemia secondary to blood loss (chronic): Secondary | ICD-10-CM

## 2021-05-21 LAB — CBC WITH DIFFERENTIAL/PLATELET
Abs Immature Granulocytes: 0.03 10*3/uL (ref 0.00–0.07)
Basophils Absolute: 0 10*3/uL (ref 0.0–0.1)
Basophils Relative: 0 %
Eosinophils Absolute: 0 10*3/uL (ref 0.0–0.5)
Eosinophils Relative: 0 %
HCT: 36.9 % (ref 36.0–46.0)
Hemoglobin: 12.8 g/dL (ref 12.0–15.0)
Immature Granulocytes: 1 %
Lymphocytes Relative: 7 %
Lymphs Abs: 0.4 10*3/uL — ABNORMAL LOW (ref 0.7–4.0)
MCH: 34.3 pg — ABNORMAL HIGH (ref 26.0–34.0)
MCHC: 34.7 g/dL (ref 30.0–36.0)
MCV: 98.9 fL (ref 80.0–100.0)
Monocytes Absolute: 0.3 10*3/uL (ref 0.1–1.0)
Monocytes Relative: 6 %
Neutro Abs: 5.2 10*3/uL (ref 1.7–7.7)
Neutrophils Relative %: 86 %
Platelets: 100 10*3/uL — ABNORMAL LOW (ref 150–400)
RBC: 3.73 MIL/uL — ABNORMAL LOW (ref 3.87–5.11)
RDW: 12.1 % (ref 11.5–15.5)
WBC: 6 10*3/uL (ref 4.0–10.5)
nRBC: 0 % (ref 0.0–0.2)

## 2021-05-21 LAB — IRON AND TIBC
Iron: 117 ug/dL (ref 28–170)
Saturation Ratios: 27 % (ref 10.4–31.8)
TIBC: 430 ug/dL (ref 250–450)
UIBC: 313 ug/dL

## 2021-05-21 LAB — FERRITIN: Ferritin: 142 ng/mL (ref 11–307)

## 2021-05-21 LAB — FOLATE: Folate: 32 ng/mL (ref >5.9)

## 2021-05-21 LAB — VITAMIN B12: Vitamin B-12: 758 pg/mL (ref 180–914)

## 2021-08-23 ENCOUNTER — Encounter (INDEPENDENT_AMBULATORY_CARE_PROVIDER_SITE_OTHER): Payer: Self-pay

## 2021-08-23 ENCOUNTER — Inpatient Hospital Stay: Payer: Medicare Other | Admitting: Oncology

## 2021-08-23 ENCOUNTER — Inpatient Hospital Stay: Payer: Medicare Other | Attending: Oncology

## 2021-08-23 ENCOUNTER — Encounter: Payer: Self-pay | Admitting: Oncology

## 2021-08-23 ENCOUNTER — Other Ambulatory Visit: Payer: Self-pay

## 2021-08-23 VITALS — BP 107/64 | HR 77 | Temp 96.0°F | Resp 18 | Wt 143.0 lb

## 2021-08-23 DIAGNOSIS — D500 Iron deficiency anemia secondary to blood loss (chronic): Secondary | ICD-10-CM

## 2021-08-23 DIAGNOSIS — D72819 Decreased white blood cell count, unspecified: Secondary | ICD-10-CM

## 2021-08-23 DIAGNOSIS — Z79899 Other long term (current) drug therapy: Secondary | ICD-10-CM | POA: Insufficient documentation

## 2021-08-23 DIAGNOSIS — Z7901 Long term (current) use of anticoagulants: Secondary | ICD-10-CM | POA: Diagnosis not present

## 2021-08-23 DIAGNOSIS — D509 Iron deficiency anemia, unspecified: Secondary | ICD-10-CM | POA: Diagnosis not present

## 2021-08-23 DIAGNOSIS — I4891 Unspecified atrial fibrillation: Secondary | ICD-10-CM | POA: Insufficient documentation

## 2021-08-23 DIAGNOSIS — D696 Thrombocytopenia, unspecified: Secondary | ICD-10-CM | POA: Insufficient documentation

## 2021-08-23 LAB — IRON AND TIBC
Iron: 136 ug/dL (ref 28–170)
Saturation Ratios: 30 % (ref 10.4–31.8)
TIBC: 452 ug/dL — ABNORMAL HIGH (ref 250–450)
UIBC: 316 ug/dL

## 2021-08-23 LAB — CBC WITH DIFFERENTIAL/PLATELET
Abs Immature Granulocytes: 0.01 10*3/uL (ref 0.00–0.07)
Basophils Absolute: 0 10*3/uL (ref 0.0–0.1)
Basophils Relative: 1 %
Eosinophils Absolute: 0.2 10*3/uL (ref 0.0–0.5)
Eosinophils Relative: 7 %
HCT: 40.6 % (ref 36.0–46.0)
Hemoglobin: 13.6 g/dL (ref 12.0–15.0)
Immature Granulocytes: 0 %
Lymphocytes Relative: 16 %
Lymphs Abs: 0.5 10*3/uL — ABNORMAL LOW (ref 0.7–4.0)
MCH: 33.7 pg (ref 26.0–34.0)
MCHC: 33.5 g/dL (ref 30.0–36.0)
MCV: 100.7 fL — ABNORMAL HIGH (ref 80.0–100.0)
Monocytes Absolute: 0.3 10*3/uL (ref 0.1–1.0)
Monocytes Relative: 9 %
Neutro Abs: 1.9 10*3/uL (ref 1.7–7.7)
Neutrophils Relative %: 67 %
Platelets: 97 10*3/uL — ABNORMAL LOW (ref 150–400)
RBC: 4.03 MIL/uL (ref 3.87–5.11)
RDW: 12 % (ref 11.5–15.5)
WBC: 2.9 10*3/uL — ABNORMAL LOW (ref 4.0–10.5)
nRBC: 0 % (ref 0.0–0.2)

## 2021-08-23 LAB — FERRITIN: Ferritin: 129 ng/mL (ref 11–307)

## 2021-08-23 NOTE — Progress Notes (Signed)
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 EGD showed salmon-colored mucosa suspicious for short segment Barrett's.  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  Nonbleeding erosive gastropathy.  Normal examined duodenum.  No specimens collected.Colonoscopy showed 7 nonbleeding colonic angiectasia treated with APC.  Nonbleeding internal hemorrhoids.  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  On a couple of occasions her white count has been normal at around 4.1.  Her white count was noted to be 2.4 on her recent blood work and prior to that a month ago it was 2.7.  Differential mainly shows lymphopenia with a neutrophil count that fluctuates between 1.8-3.7.  She is also developed gradual onset thrombocytopenia over the last 2 years and her platelet counts have gradually drifted down from 1 45-1 07 in March 2022.  More recently in April she was noted to  have a platelet count of 98.   Bone marrow biopsy showed slightly hypercellular marrow with trilineage-hematopoiesis with abundant megakaryocytes with normal morphology.  Generally mild nonspecific changes involving the myeloid cell lines with no increase in blasts.  Cytogenetics and FISH studies currently pending.  Myeloma panel, B12, folate unremarkable.  ANA comprehensive panel negative.  Interval history- she is doing well overall. No recent illnesses. Appetite and weight have remained stable. She does report a large area of bruise which she developed on her right thigh spontaneously and subsequently resolved.s he is on coumadin for afib. Denies other bleeding issues  ECOG PS- 1 Pain scale- 0   Review of systems- Review of Systems  Constitutional:  Negative for chills, fever, malaise/fatigue and weight loss.  HENT:  Negative for congestion, ear discharge and nosebleeds.   Eyes:  Negative for blurred vision.  Respiratory:  Negative for cough, hemoptysis, sputum production, shortness of breath and wheezing.   Cardiovascular:  Negative for chest pain, palpitations, orthopnea and claudication.  Gastrointestinal:  Negative for abdominal pain, blood in stool, constipation, diarrhea, heartburn, melena, nausea and vomiting.  Genitourinary:  Negative for dysuria, flank pain, frequency, hematuria and urgency.  Musculoskeletal:  Negative for back pain, joint pain and myalgias.  Skin:  Negative for rash.  Neurological:  Negative for dizziness, tingling, focal weakness, seizures, weakness and headaches.  Endo/Heme/Allergies:  Does not bruise/bleed easily.  Psychiatric/Behavioral:  Negative for depression and suicidal ideas. The patient does not have insomnia.       Allergies  Allergen Reactions   Vioxx [Rofecoxib] Swelling     Past Medical History:  Diagnosis Date   Anemia    Arthritis  Atrial fibrillation (HCC)    Cardiomyopathy ([place])    CHF (congestive heart failure) ([place])     Coronary artery disease    Diabetes mellitus without complication (HCC)    Dyspnea    with exertion   Dysrhythmia    GERD (gastroesophageal reflux disease)    Headache    migraines   Heart murmur    History of shingles 2004   Hyperlipidemia    Hypertension    Hypothyroidism    IBS (irritable bowel syndrome)    TIA (transient ischemic attack)    Vitamin B 12 deficiency      Past Surgical History:  Procedure Laterality Date   CARDIAC CATHETERIZATION     COLONOSCOPY N/A 12/19/2018   Procedure: COLONOSCOPY;  Surgeon: Virgel Manifold, MD;  Location: [facility] ENDOSCOPY;  Service: Endoscopy;  Laterality: N/A;   COLONOSCOPY WITH PROPOFOL N/A 06/17/2020   Procedure: COLONOSCOPY WITH PROPOFOL;  Surgeon: Virgel Manifold, MD;  Location: [facility] ENDOSCOPY;  Service: Endoscopy;  Laterality: N/A;   CORONARY ARTERY BYPASS GRAFT     ESOPHAGOGASTRODUODENOSCOPY N/A 12/19/2018   Procedure: ESOPHAGOGASTRODUODENOSCOPY (EGD);  Surgeon: Virgel Manifold, MD;  Location: [facility] ENDOSCOPY;  Service: Endoscopy;  Laterality: N/A;   ESOPHAGOGASTRODUODENOSCOPY (EGD) WITH PROPOFOL N/A 06/17/2020   Procedure: ESOPHAGOGASTRODUODENOSCOPY (EGD) WITH PROPOFOL;  Surgeon: Virgel Manifold, MD;  Location: [facility] ENDOSCOPY;  Service: Endoscopy;  Laterality: N/A;   EYE SURGERY Bilateral    Cataract Extraction with IOL   RIGHT/LEFT HEART CATH AND CORONARY ANGIOGRAPHY Bilateral 03/29/2017   Procedure: Right/Left Heart Cath and Coronary Angiography;  Surgeon: Teodoro Spray, MD;  Location: [place] [facility];  Service: Cardiovascular;  Laterality: Bilateral;   TUBAL LIGATION     VENTRAL HERNIA REPAIR N/A 04/21/2017   Procedure: HERNIA REPAIR VENTRAL ADULT;  Surgeon: Leonie Green, MD;  Location: [facility] ORS;  Service: General;  Laterality: N/A;    Social History   Socioeconomic History   Marital status: Widowed    Spouse name: Not on file   Number of children: Not on file   Years of education: Not on file    Highest education level: Not on file  Occupational History   Occupation: certified nursing assistant  Tobacco Use   Smoking status: Former    Types: Cigarettes    Quit date: 03/30/1999    Years since quitting: 22.4   Smokeless tobacco: Never  Vaping Use   Vaping Use: Never used  Substance and Sexual Activity   Alcohol use: No   Drug use: No   Sexual activity: Not Currently  Other Topics Concern   Not on file  Social History Narrative   Not on file   Social Determinants of Health   Financial Resource Strain: Not on file  Food Insecurity: Not on file  Transportation Needs: Not on file  Physical Activity: Not on file  Stress: Not on file  Social Connections: Not on file  Intimate Partner Violence: Not on file    Family History  Problem Relation Age of Onset   Cancer Sister        lung   Cancer Sister        cervical   Valvular heart disease Mother    Diabetes Father    Breast cancer Neg Hx      Current Outpatient Medications:    albuterol (PROVENTIL HFA;VENTOLIN HFA) 108 (90 Base) MCG/ACT inhaler, Inhale 2 puffs into the lungs every 6 (six) hours as needed for wheezing or shortness of breath., Disp: ,  Rfl:    aspirin EC 81 MG tablet, Take 81 mg by mouth daily., Disp: , Rfl:    carvedilol (COREG) 3.125 MG tablet, Take 3.125 mg by mouth 2 (two) times daily with a meal., Disp: , Rfl:    Cyanocobalamin (VITAMIN B 12 PO), Take 1 tablet by mouth daily., Disp: , Rfl:    ENTRESTO 24-26 MG, Take 1 tablet by mouth 2 (two) times daily., Disp: , Rfl:    fluticasone (FLONASE) 50 MCG/ACT nasal spray, Place 1 spray into the nose. , Disp: , Rfl:    furosemide (LASIX) 40 MG tablet, Take 80 mg by mouth daily. , Disp: , Rfl:    latanoprost (XALATAN) 0.005 % ophthalmic solution, Place 1 drop into both eyes at bedtime., Disp: , Rfl:    levothyroxine (SYNTHROID, LEVOTHROID) 75 MCG tablet, Take 75 mcg by mouth daily before breakfast., Disp: , Rfl:    lovastatin (MEVACOR) 10 MG tablet, Take  10 mg by mouth at bedtime., Disp: , Rfl:    metFORMIN (GLUCOPHAGE) 1000 MG tablet, Take 1,000 mg by mouth 2 (two) times daily with a meal., Disp: , Rfl:    pantoprazole (PROTONIX) 40 MG tablet, Take 40 mg by mouth daily., Disp: , Rfl:    spironolactone (ALDACTONE) 25 MG tablet, Take 25 mg by mouth daily., Disp: , Rfl:    warfarin (COUMADIN) 3 MG tablet, Take 3 mg by mouth at bedtime. 3.25m M-W-F, 316mTues, Thurs, Sat, Sun, Disp: , Rfl:    Misc Natural Products (NEURIVA) CAPS, Take by mouth. (Patient not taking: No sig reported), Disp: , Rfl:   Physical exam:  Vitals:   08/23/21 1118  BP: 107/64  Pulse: 77  Resp: 18  Temp: (!) 96 F (35.6 C)  TempSrc: Tympanic  SpO2: 100%  Weight: 143 lb (64.9 kg)   Physical Exam Constitutional:      General: She is not in acute distress. HENT:     Head: Normocephalic.  Cardiovascular:     Rate and Rhythm: Normal rate and regular rhythm.     Heart sounds: Normal heart sounds.  Pulmonary:     Effort: Pulmonary effort is normal.     Breath sounds: Normal breath sounds.  Abdominal:     General: Bowel sounds are normal.     Palpations: Abdomen is soft.     Comments: No palpable hepatosplenomegaly  Lymphadenopathy:     Comments: No palpable cervical, supraclavicular, axillary or inguinal adenopathy   Skin:    General: Skin is warm and dry.  Neurological:     Mental Status: She is alert and oriented to person, place, and time.     CMP Latest Ref Rng & Units 05/18/2020  Glucose 70 - 99 mg/dL -  BUN 8 - 23 mg/dL -  Creatinine 0.44 - 1.00 mg/dL -  Sodium 135 - 145 mmol/L -  Potassium 3.5 - 5.1 mmol/L -  Chloride 98 - 111 mmol/L -  CO2 22 - 32 mmol/L -  Calcium 8.9 - 10.3 mg/dL -  Total Protein 6.0 - 8.5 g/dL 6.8  Total Bilirubin 0.0 - 1.2 mg/dL <0.2  Alkaline Phos 48 - 121 IU/L 92  AST 0 - 40 IU/L 31  ALT 0 - 32 IU/L 24   CBC Latest Ref Rng & Units 08/23/2021  WBC 4.0 - 10.5 K/uL 2.9(L)  Hemoglobin 12.0 - 15.0 g/dL 13.6  Hematocrit  36.0 - 46.0 % 40.6  Platelets 150 - 400 K/uL 97(L)     Assessment and  plan- Patient is a 65 y.o. female with leukopenia/ thrombocytopenia here for routine f/u  Patient has has waxing and waning leukopenia between 2.5 and 3.5 that waxes and wanes over the last 2 years but has remained stable overall with no declining trend. Anc today is 1.9. platelets have drifted down but overall stable 85-100 in the last 2 years. I do not think this contributed to her bruise.   Bone marrow biopsy did not reveal any primary bone marrow disorder that would explain leukopenia/ thrombocytopenia. She does have a h/o psoriasis and its possible that her cytopenia is autoimmune as well. She does not require any treatment for this presently.   Cbc with diff in 4 and 8 months and I will see her in 8 months   Visit Diagnosis 1. Iron deficiency anemia, unspecified iron deficiency anemia type   2. Leukopenia, unspecified type      Dr. Randa Evens, MD, MPH [facility] at [facility] [phone] 08/23/2021 12:04 PM

## 2021-10-25 ENCOUNTER — Other Ambulatory Visit: Payer: Self-pay | Admitting: Family Medicine

## 2021-10-25 DIAGNOSIS — Z1231 Encounter for screening mammogram for malignant neoplasm of breast: Secondary | ICD-10-CM

## 2021-12-19 ENCOUNTER — Emergency Department: Payer: Medicare Other

## 2021-12-19 ENCOUNTER — Encounter: Payer: Self-pay | Admitting: Radiology

## 2021-12-19 ENCOUNTER — Emergency Department
Admission: EM | Admit: 2021-12-19 | Discharge: 2021-12-19 | Disposition: A | Discharge status: Home / Self Care | Payer: Medicare Other | Attending: Emergency Medicine | Admitting: Emergency Medicine

## 2021-12-19 ENCOUNTER — Other Ambulatory Visit: Payer: Self-pay

## 2021-12-19 DIAGNOSIS — S8991XA Unspecified injury of right lower leg, initial encounter: Secondary | ICD-10-CM | POA: Diagnosis present

## 2021-12-19 DIAGNOSIS — M1711 Unilateral primary osteoarthritis, right knee: Secondary | ICD-10-CM | POA: Diagnosis not present

## 2021-12-19 DIAGNOSIS — X58XXXA Exposure to other specified factors, initial encounter: Secondary | ICD-10-CM | POA: Insufficient documentation

## 2021-12-19 DIAGNOSIS — S8001XA Contusion of right knee, initial encounter: Secondary | ICD-10-CM | POA: Diagnosis not present

## 2021-12-19 MED ORDER — HYDROCODONE-ACETAMINOPHEN 5-325 MG PO TABS
1.0000 | ORAL_TABLET | Freq: Once | ORAL | Status: AC
  Administered 2021-12-19: 1 via ORAL
  Filled 2021-12-19: qty 1

## 2021-12-19 MED ORDER — HYDROCODONE-ACETAMINOPHEN 5-325 MG PO TABS: 1.0000 | ORAL_TABLET | ORAL | 0 refills | Status: DC | PRN

## 2021-12-19 NOTE — ED Triage Notes (Signed)
Pt states her right knee is painful and "locking up" pt denies known injury, states has a history of arthritis. Pt ambulatory with cane.

## 2021-12-19 NOTE — ED Provider Notes (Signed)
[facility] Provider Note  Patient Contact: 8:32 PM (approximate)   History   right knee pain   HPI  Tammy Arias is a 66 y.o. female who presents the emergency department complaining of right knee pain and a "catching" sensation in her knee.  Patient has tricompartmental arthritis, scheduled to discuss replacement on February 13.  Patient states that she recently had a cortisone injection into her knee and since then has had increasing pain and a catching sensation.  No recent trauma.  No other complaints at this time.  Evaluating the patient's medical record I can see where patient had her injection 12 days ago with orthopedics.     Physical Exam   Triage Vital Signs: ED Triage Vitals [12/19/21 2005]  Enc Vitals Group     BP 132/78     Pulse Rate 83     Resp 16     Temp 98.3 F (36.8 C)     Temp Source Oral     SpO2 96 %     Weight 147 lb (66.7 kg)     Height 5\' 5"  (1.651 m)     Head Circumference      Peak Flow      Pain Score      Pain Loc      Pain Edu?      Excl. in [place]?     Most recent vital signs: Vitals:   12/19/21 2005  BP: 132/78  Pulse: 83  Resp: 16  Temp: 98.3 F (36.8 C)  SpO2: 96%     General: Alert and in no acute distress.  Cardiovascular:  Good peripheral perfusion Respiratory: Normal respiratory effort without tachypnea or retractions. Lungs CTAB.  Musculoskeletal: Full range of motion to all extremities.  Visualization of the right knee reveals no gross deformity, edema, erythema.  There is a small area of ecchymosis from patient's cortisone injection to the right superior and lateral aspect of the knee joint.  No warmth to palpation.  No ballottement.  Special tests of the knee were negative.  Dorsalis pedis pulses sensation intact distally. Neurologic:  No gross focal neurologic deficits are appreciated.  Skin:   No rash noted Other:   ED Results / Procedures / Treatments   Labs (all labs ordered are  listed, but only abnormal results are displayed) Labs Reviewed - No data to display   EKG     RADIOLOGY    DG Knee Complete 4 Views Right  Result Date: 12/19/2021 CLINICAL DATA:  Pain EXAM: RIGHT KNEE - COMPLETE 4+ VIEW COMPARISON:  None. FINDINGS: Tricompartment degenerative changes and chondrocalcinosis. No joint effusion. No acute bony abnormality. Specifically, no fracture, subluxation, or dislocation. Soft tissues are intact. IMPRESSION: Degenerative changes.  No acute bony abnormality. Electronically Signed   By: Rolm Baptise M.D.   On: 12/19/2021 20:33    PROCEDURES:  Critical Care performed: No  Procedures   MEDICATIONS ORDERED IN ED: Medications  HYDROcodone-acetaminophen (NORCO/VICODIN) 5-325 MG per tablet 1 tablet (1 tablet Oral Given 12/19/21 2106)     IMPRESSION / MDM / ASSESSMENT AND PLAN / ED COURSE  I reviewed the triage vital signs and the nursing notes.                              Differential diagnosis includes, but is not limited to, osteoarthritis, gout, septic arthritis   Patient's diagnosis is consistent with osteoarthritis.  Patient presented  to emergency department with ongoing right knee pain.  She is scheduled to talk to orthopedics about a replacement on 13 February.  Patient had a cortisone injection placed by orthopedics 12 days ago which I can see in her chart.  Patient with increased pain and a catching sensation in her knee since this injection.  There was no concern on exam for septic arthritis.  X-ray of the knee is reassuring with findings consistent with osteoarthritis but no acute traumatic findings.  This time patient will have pain medication but is referred back to orthopedics to discuss knee replacement.  Return precautions discussed with the patient.  Follow-up with orthopedics as described above.. Patient is given ED precautions to return to the ED for any worsening or new symptoms.        FINAL CLINICAL IMPRESSION(S) / ED  DIAGNOSES   Final diagnoses:  Primary osteoarthritis of right knee     Rx / DC Orders   ED Discharge Orders          Ordered    HYDROcodone-acetaminophen (NORCO/VICODIN) 5-325 MG tablet  Every 4 hours PRN        12/19/21 2103             Note:  This document was prepared using Dragon voice recognition software and may include unintentional dictation errors.   Brynda Peon 12/19/21 2232    Duffy Bruce, MD 12/29/21 [phone]

## 2021-12-22 ENCOUNTER — Other Ambulatory Visit: Payer: Self-pay

## 2021-12-22 ENCOUNTER — Ambulatory Visit
Admission: RE | Admit: 2021-12-22 | Discharge: 2021-12-22 | Disposition: A | Discharge status: Home / Self Care | Payer: Medicare Other | Source: Ambulatory Visit | Attending: Family Medicine | Admitting: Family Medicine

## 2021-12-22 DIAGNOSIS — Z1231 Encounter for screening mammogram for malignant neoplasm of breast: Secondary | ICD-10-CM | POA: Insufficient documentation

## 2021-12-23 ENCOUNTER — Inpatient Hospital Stay: Payer: Medicare Other | Attending: Oncology

## 2021-12-23 DIAGNOSIS — D509 Iron deficiency anemia, unspecified: Secondary | ICD-10-CM | POA: Diagnosis present

## 2021-12-23 DIAGNOSIS — E538 Deficiency of other specified B group vitamins: Secondary | ICD-10-CM | POA: Insufficient documentation

## 2021-12-23 DIAGNOSIS — D72819 Decreased white blood cell count, unspecified: Secondary | ICD-10-CM | POA: Diagnosis not present

## 2021-12-23 DIAGNOSIS — Z79899 Other long term (current) drug therapy: Secondary | ICD-10-CM | POA: Diagnosis not present

## 2021-12-23 LAB — CBC WITH DIFFERENTIAL/PLATELET
Abs Immature Granulocytes: 0.01 10*3/uL (ref 0.00–0.07)
Basophils Absolute: 0 10*3/uL (ref 0.0–0.1)
Basophils Relative: 1 %
Eosinophils Absolute: 0.3 10*3/uL (ref 0.0–0.5)
Eosinophils Relative: 9 %
HCT: 37.8 % (ref 36.0–46.0)
Hemoglobin: 12.5 g/dL (ref 12.0–15.0)
Immature Granulocytes: 0 %
Lymphocytes Relative: 12 %
Lymphs Abs: 0.4 10*3/uL — ABNORMAL LOW (ref 0.7–4.0)
MCH: 33.2 pg (ref 26.0–34.0)
MCHC: 33.1 g/dL (ref 30.0–36.0)
MCV: 100.5 fL — ABNORMAL HIGH (ref 80.0–100.0)
Monocytes Absolute: 0.3 10*3/uL (ref 0.1–1.0)
Monocytes Relative: 10 %
Neutro Abs: 2.3 10*3/uL (ref 1.7–7.7)
Neutrophils Relative %: 68 %
Platelets: 96 10*3/uL — ABNORMAL LOW (ref 150–400)
RBC: 3.76 MIL/uL — ABNORMAL LOW (ref 3.87–5.11)
RDW: 12.6 % (ref 11.5–15.5)
WBC: 3.4 10*3/uL — ABNORMAL LOW (ref 4.0–10.5)
nRBC: 0 % (ref 0.0–0.2)

## 2021-12-23 LAB — TSH: TSH: 4.064 u[IU]/mL (ref 0.350–4.500)

## 2021-12-23 LAB — FOLATE: Folate: 25 ng/mL (ref >5.9)

## 2021-12-23 LAB — VITAMIN B12: Vitamin B-12: 726 pg/mL (ref 180–914)

## 2022-01-10 ENCOUNTER — Other Ambulatory Visit: Payer: Self-pay | Admitting: Orthopedic Surgery

## 2022-01-10 DIAGNOSIS — M1711 Unilateral primary osteoarthritis, right knee: Secondary | ICD-10-CM

## 2022-01-21 ENCOUNTER — Other Ambulatory Visit: Payer: Self-pay

## 2022-01-21 ENCOUNTER — Ambulatory Visit
Admission: RE | Admit: 2022-01-21 | Discharge: 2022-01-21 | Disposition: A | Discharge status: Home / Self Care | Payer: Medicare Other | Source: Ambulatory Visit | Attending: Orthopedic Surgery | Admitting: Orthopedic Surgery

## 2022-01-21 DIAGNOSIS — M1711 Unilateral primary osteoarthritis, right knee: Secondary | ICD-10-CM | POA: Diagnosis present

## 2022-03-02 ENCOUNTER — Other Ambulatory Visit: Payer: Self-pay | Admitting: Orthopedic Surgery

## 2022-03-22 ENCOUNTER — Other Ambulatory Visit: Payer: Medicare Other

## 2022-03-29 ENCOUNTER — Encounter
Admission: RE | Admit: 2022-03-29 | Discharge: 2022-03-29 | Disposition: A | Discharge status: Home / Self Care | Payer: Medicare Other | Source: Ambulatory Visit | Attending: Orthopedic Surgery | Admitting: Orthopedic Surgery

## 2022-03-29 VITALS — BP 90/66 | HR 73 | Resp 16 | Ht 61.0 in | Wt 149.0 lb

## 2022-03-29 DIAGNOSIS — Z9229 Personal history of other drug therapy: Secondary | ICD-10-CM | POA: Insufficient documentation

## 2022-03-29 DIAGNOSIS — Z01818 Encounter for other preprocedural examination: Secondary | ICD-10-CM | POA: Diagnosis present

## 2022-03-29 HISTORY — DX: Cerebral infarction, unspecified: I63.9

## 2022-03-29 HISTORY — DX: Age-related osteoporosis without current pathological fracture: M81.0

## 2022-03-29 LAB — CBC WITH DIFFERENTIAL/PLATELET
Abs Immature Granulocytes: 0.01 10*3/uL (ref 0.00–0.07)
Basophils Absolute: 0 10*3/uL (ref 0.0–0.1)
Basophils Relative: 1 %
Eosinophils Absolute: 0.2 10*3/uL (ref 0.0–0.5)
Eosinophils Relative: 10 %
HCT: 37.1 % (ref 36.0–46.0)
Hemoglobin: 12.2 g/dL (ref 12.0–15.0)
Immature Granulocytes: 0 %
Lymphocytes Relative: 15 %
Lymphs Abs: 0.4 10*3/uL — ABNORMAL LOW (ref 0.7–4.0)
MCH: 33.1 pg (ref 26.0–34.0)
MCHC: 32.9 g/dL (ref 30.0–36.0)
MCV: 100.5 fL — ABNORMAL HIGH (ref 80.0–100.0)
Monocytes Absolute: 0.2 10*3/uL (ref 0.1–1.0)
Monocytes Relative: 8 %
Neutro Abs: 1.6 10*3/uL — ABNORMAL LOW (ref 1.7–7.7)
Neutrophils Relative %: 66 %
Platelets: 102 10*3/uL — ABNORMAL LOW (ref 150–400)
RBC: 3.69 MIL/uL — ABNORMAL LOW (ref 3.87–5.11)
RDW: 12.6 % (ref 11.5–15.5)
WBC: 2.4 10*3/uL — ABNORMAL LOW (ref 4.0–10.5)
nRBC: 0 % (ref 0.0–0.2)

## 2022-03-29 LAB — COMPREHENSIVE METABOLIC PANEL
ALT: 25 U/L (ref 0–44)
AST: 36 U/L (ref 15–41)
Albumin: 4.5 g/dL (ref 3.5–5.0)
Alkaline Phosphatase: 68 U/L (ref 38–126)
Anion gap: 10 (ref 5–15)
BUN: 18 mg/dL (ref 8–23)
CO2: 30 mmol/L (ref 22–32)
Calcium: 9.3 mg/dL (ref 8.9–10.3)
Chloride: 99 mmol/L (ref 98–111)
Creatinine, Ser: 1.11 mg/dL — ABNORMAL HIGH (ref 0.44–1.00)
GFR, Estimated: 54 mL/min — ABNORMAL LOW (ref >60)
Glucose, Bld: 142 mg/dL — ABNORMAL HIGH (ref 70–99)
Potassium: 3.5 mmol/L (ref 3.5–5.1)
Sodium: 139 mmol/L (ref 135–145)
Total Bilirubin: 0.6 mg/dL (ref 0.3–1.2)
Total Protein: 7.6 g/dL (ref 6.5–8.1)

## 2022-03-29 LAB — URINALYSIS, ROUTINE W REFLEX MICROSCOPIC
Bilirubin Urine: NEGATIVE
Glucose, UA: NEGATIVE mg/dL
Hgb urine dipstick: NEGATIVE
Ketones, ur: NEGATIVE mg/dL
Leukocytes,Ua: NEGATIVE
Nitrite: NEGATIVE
Protein, ur: NEGATIVE mg/dL
Specific Gravity, Urine: 1.005 (ref 1.005–1.030)
pH: 6 (ref 5.0–8.0)

## 2022-03-29 LAB — SURGICAL PCR SCREEN
MRSA, PCR: NEGATIVE
Staphylococcus aureus: NEGATIVE

## 2022-03-29 NOTE — Patient Instructions (Addendum)
Your procedure is scheduled on: 04/05/22 Report to [place]. To find out your arrival time please call [phone] between 1PM - 3PM on 04/04/22.  Remember: Instructions that are not followed completely may result in serious medical risk, up to and including death, or upon the discretion of your surgeon and anesthesiologist your surgery may need to be rescheduled.     _X__ 1. Do not eat food after midnight the night before your procedure.                 No gum chewing or hard candies. You may drink clear liquids up to 2 hours                 before you are scheduled to arrive for your surgery- DO not drink clear                 liquids within 2 hours of the start of your surgery.                 Clear Liquids include:  ). Diabetics water only Drink the G2 Gatorade 2 hours before arriving to hospital  __X__2.  On the morning of surgery brush your teeth with toothpaste and water, you                 may rinse your mouth with mouthwash if you wish.  Do not swallow any              toothpaste of mouthwash.     _X__ 3.  No Alcohol for 24 hours before or after surgery.   _X__ 4.  Do Not Smoke or use e-cigarettes For 24 Hours Prior to Your Surgery.                 Do not use any chewable tobacco products for at least 6 hours prior to                 surgery.  ____  5.  Bring all medications with you on the day of surgery if instructed.   __X__  6.  Notify your doctor if there is any change in your medical condition      (cold, fever, infections).     Do not wear jewelry, make-up, hairpins, clips or nail/toenail polish. Do not wear lotions, powders, or perfumes.  Do not shave body hair 48 hours prior to surgery. Men may shave face and neck. Do not bring valuables to the hospital.    [facility] is not responsible for any belongings or valuables.  Contacts, dentures/partials or body piercings may not be worn into surgery. Bring a case  for your contacts, glasses or hearing aids, a denture cup will be supplied. Leave your suitcase in the car. After surgery it may be brought to your room. For patients admitted to the hospital, discharge time is determined by your treatment team.   Patients discharged the day of surgery will not be allowed to drive home.   Please read over the following fact sheets that you were given:   MRSA Information, CHG soap, Incentive Spirometer  __X__ Take these medicines the morning of surgery with A SIP OF WATER:    1. carvedilol (COREG) 3.125 MG tablet  2. levothyroxine (SYNTHROID) 50 MCG tablet  3. pantoprazole (PROTONIX) 40 MG tablet  4.  5.  6.  ____ Fleet Enema (as directed)   __X__ Use CHG Soap/SAGE wipes  as directed  __X__ Use inhalers on the day of surgery  __X__ Stop metformin/Janumet/Farxiga 2 days prior to surgery  (LAST DOSE Sunday MORNING 04/03/22)  ____ Take 1/2 of usual insulin dose the night before surgery. No insulin the morning          of surgery.   ____ Stop Blood Thinners Coumadin/Plavix/Xarelto/Pleta/Pradaxa/Eliquis/Effient/Aspirin  on   Or contact your Surgeon, Cardiologist or Medical Doctor regarding  ability to stop your blood thinners  __X__ Stop Anti-inflammatories 7 days before surgery such as Advil, Ibuprofen, Motrin,  BC or Goodies Powder, Naprosyn, Naproxen, Aleve, Aspirin    __X__ Stop all herbals and supplements, fish oil or vitamins  until after surgery.    ____ Bring C-Pap to the hospital.    WE WILL CALL REGARDING YOUR COUMADIN ONCE WE HEAR FROM [facility]

## 2022-04-01 ENCOUNTER — Encounter: Payer: Self-pay | Admitting: Orthopedic Surgery

## 2022-04-01 NOTE — Progress Notes (Signed)
?Perioperative Services ? ?Pre-Admission/Anesthesia Testing Clinical Review ? ?Date: 04/01/22 ? ?Patient Demographics:  ?Name: Tammy Arias ?DOB:   1956/07/18 ?MRN:   [id number] ? ?Planned Surgical Procedure(s):  ? ? Case: [id number] Date/Time: 04/05/22 0700  ? Procedure: TOTAL KNEE ARTHROPLASTY (Right: Knee)  ? Anesthesia type: Choice  ? Pre-op diagnosis: Primary osteoarthritis of right knee M17.11  ? Location: [facility] OR ROOM 01 / [facility] ORS FOR ANESTHESIA GROUP  ? Surgeons: Hessie Knows, MD  ? ?NOTE: Available PAT nursing documentation and vital signs have been reviewed. Clinical nursing staff has updated patient's PMH/PSHx, current medication list, and drug allergies/intolerances to ensure comprehensive history available to assist in medical decision making as it pertains to the aforementioned surgical procedure and anticipated anesthetic course. Extensive review of available clinical information performed. [place] PMH and PSHx updated with any diagnoses/procedures that  may have been inadvertently omitted during her intake with the pre-admission testing department's nursing staff. ? ?Clinical Discussion:  ?Tammy Arias is a 66 y.o. female who is submitted for pre-surgical anesthesia review and clearance prior to her undergoing the above procedure. Patient is a Former Smoker (quit 03/1999). Pertinent PMH includes: CAD (s/p CABG), ischemic cardiomyopathy, HFrEF, cor triatriatum (s/p repair), atrial fibrillation, cardiac murmur, TIA, pulmonary hypertension, HTN, HLD, T2DM, hypothyroidism, GERD (on daily PPI), DOE, anemia, OA, lumbar scoliosis, lumbar spinal stenosis. ? ?Patient is followed by cardiology Corky Sox, MD). She was last seen in the cardiology clinic on 11/10/2021; notes reviewed.  At the time of her clinic visit, patient doing well overall from a cardiovascular perspective.  She denied any episodes of chest pain, shortness of breath, PND, orthopnea, significant peripheral edema, fatigue, vertiginous  symptoms, or presyncope/syncope.  Patient with nocturnal palpitations when supine, however this is at baseline for her.  Past medical history significant for cardiovascular diagnoses.  ? ?Patient underwent a three-vessel CABG on 01/28/2004. ? ?Diagnostic right/left heart catheterization performed on 03/29/2017 revealing a small RCA with occluded SVG; no significant disease.  LAD chronically occluded with SVG to D1/LAD.  There was insignificant disease in the LCx.  LM was normal.  Mean PA pressure 33 mmHg, PCWP 22 mmHg, LVEDP 14 mmHg, mean AO 79 mmHg.  Cardiac output 7.89 L/min.  Cardiac index 4.61 L/min/m?. ? ?Patient developed an ischemic cardiomyopathy with resulting HFrEF.  Last TTE was performed on 10/25/2018 revealing a moderately reduced left ventricular systolic function with an EF of 35%.  There was moderate biatrial and right ventricular enlargement.  There was global hypokinesis.  Diastolic Doppler parameters consistent with pseudonormalization (G2DD). Trivial PR, moderate MR, and severe TR noted.  RVSP elevated at 57.7 mmHg indicative of pulmonary hypertension.  There was no evidence of a significant transvalvular gradient to suggest valvular stenosis. ? ?Myocardial perfusion imaging study revealed a low normal left ventricular systolic function with an EF of 51%.  There was a small fixed perfusion abnormality present in the inferior region on stress imaging.  Exercise tolerance was good.  There was no evidence of obvious stress-induced myocardial ischemia or arrhythmia. ? ?Cardiac MRI performed on 09/28/2020 revealing a moderately reduced left ventricular systolic function with an EF of 42%.  There was severe hypokinesis to akinesis of the basal to mid lateral walls, and global mid to moderate hypokinesis of the remaining left ventricular walls.  Right ventricular systolic function was mild to moderately reduced with an RVEF of 37%.  There was severe biatrial enlargement.  There was mild to moderate  mitral valve, moderate tricuspid valve, and mild  pulmonic valve regurgitation.  Delayed enhancement imaging abnormal.  There was a transmural myocardial infarction involving the basal to mid anterolateral and inferolateral ventricular walls (LCx perfusion territory).  Adenosine stress perfusion imaging demonstrated no evidence of inducible myocardial ischemia. ? ?Patient with a history of atrial fibrillation; CHA2DS2-VASc Score = 7 (age, sex, HFrEF, HTN, TIA x 2, T2DM).  Rate and rhythm maintained on oral carvedilol.  Patient is chronically anticoagulated with warfarin; compliant with therapy with no evidence or reports of GI bleeding.  HFrEF and blood pressure well controlled on currently prescribed beta-blocker, ARNi/ARB (Entresto), and diuretic therapies; blood pressure documented 112/68 mmHg.  Patient is on a statin for her HLD and further ASCVD prevention.  T2DM well-controlled on currently prescribed regimen; last HgbA1c was 6.3% when checked on 02/23/2022.  Patient maintains an active lifestyle and has a formalized exercise regimen. Functional capacity, as defined by DASI, is documented as being >/= 4 METS.  No changes were made to her medication regimen.  Patient to follow-up with outpatient cardiology in 6 months or sooner if needed. ? ?Tammy Arias is scheduled for an elective RIGHT TOTAL KNEE ARTHROPLASTY on 04/05/2022 with Dr. Hessie Knows, MD. Given patient's past medical history significant for cardiovascular diagnoses, presurgical cardiac clearance was sought by the PAT team. Per cardiology, "this patient is optimized for surgery and may proceed with the planned procedural course with a ACCEPTABLE risk of significant perioperative cardiovascular complications".  Again, this patient is on daily anticoagulation therapy.  She has been instructed on recommendations from her primary cardiologist for discontinuing her daily warfarin dose for 7 days prior to procedure with plans to restart as soon as  postoperative bleeding risk felt to be minimized by her primary attending surgeon.  The patient is aware that her last dose of warfarin should be on 03/28/2022.  Cardiology requested that patient continue her low-dose ASA throughout her perioperative course.  Cardiology did not indicate the patient will need enoxaparin bridging prior to this procedure. ? ?Patient denies previous perioperative complications with anesthesia in the past. In review of the available records, it is noted that patient underwent a general anesthetic course here at [facility] ([facility]) in 05/2020 without documented complications.  ? ? ?  03/29/2022  ?  8:54 AM 12/19/2021  ?  8:05 PM 08/23/2021  ? 11:18 AM  ?Vitals with BMI  ?Height '5\' 1"'$  '5\' 5"'$    ?Weight 149 lbs 147 lbs 143 lbs  ?BMI 28.17 24.46   ?Systolic 90 829 937  ?Diastolic 66 78 64  ?Pulse 73 83 77  ? ? ?Providers/Specialists:  ? ?NOTE: Primary physician provider listed below. Patient may have been seen by APP or partner within same practice.  ? ?PROVIDER ROLE / SPECIALTY LAST OV  ?Hessie Knows, MD Orthopedics (Surgeon) 03/28/2022  ?[place] Pink, MD Primary Care Provider 08/26/2021  ?Donnelly Angelica, MD Cardiology 11/10/2021  ?Randa Evens, MD Hematology/Oncology 08/23/2021  ? ?Allergies:  ?Vioxx [rofecoxib] ? ?Current Home Medications:  ? ?No current facility-administered medications for this encounter.  ? ? albuterol (PROVENTIL HFA;VENTOLIN HFA) 108 (90 Base) MCG/ACT inhaler  ? aspirin EC 81 MG tablet  ? carvedilol (COREG) 3.125 MG tablet  ? diclofenac Sodium (VOLTAREN) 1 % GEL  ? ENTRESTO 24-26 MG  ? fluticasone (FLONASE) 50 MCG/ACT nasal spray  ? furosemide (LASIX) 40 MG tablet  ? HYDROcodone-acetaminophen (NORCO/VICODIN) 5-325 MG tablet  ? latanoprost (XALATAN) 0.005 % ophthalmic solution  ? levothyroxine (SYNTHROID) 50 MCG tablet  ?  lovastatin (MEVACOR) 10 MG tablet  ? metFORMIN (GLUCOPHAGE) 1000 MG tablet  ? pantoprazole (PROTONIX) 40 MG tablet  ?  spironolactone (ALDACTONE) 25 MG tablet  ? vitamin B-12 (CYANOCOBALAMIN) 1000 MCG tablet  ? warfarin (COUMADIN) 3 MG tablet  ? ?History:  ? ?Past Medical History:  ?Diagnosis Date  ? Anemia   ? Arthritis   ? Atrial

## 2022-04-05 ENCOUNTER — Other Ambulatory Visit: Payer: Self-pay

## 2022-04-05 ENCOUNTER — Ambulatory Visit: Payer: Medicare Other | Admitting: Urgent Care

## 2022-04-05 ENCOUNTER — Encounter
Admission: AD | Disposition: A | Discharge status: Home Health | Payer: Self-pay | Source: Home / Self Care | Attending: Orthopedic Surgery

## 2022-04-05 ENCOUNTER — Inpatient Hospital Stay
Admission: AD | Admit: 2022-04-05 | Discharge: 2022-04-07 | DRG: 470 | Disposition: A | Discharge status: Home Health | Payer: Medicare Other | Attending: Orthopedic Surgery | Admitting: Orthopedic Surgery

## 2022-04-05 ENCOUNTER — Observation Stay: Payer: Medicare Other

## 2022-04-05 ENCOUNTER — Encounter: Payer: Self-pay | Admitting: Orthopedic Surgery

## 2022-04-05 DIAGNOSIS — Z8619 Personal history of other infectious and parasitic diseases: Secondary | ICD-10-CM

## 2022-04-05 DIAGNOSIS — I255 Ischemic cardiomyopathy: Secondary | ICD-10-CM | POA: Diagnosis present

## 2022-04-05 DIAGNOSIS — Z833 Family history of diabetes mellitus: Secondary | ICD-10-CM

## 2022-04-05 DIAGNOSIS — Z972 Presence of dental prosthetic device (complete) (partial): Secondary | ICD-10-CM

## 2022-04-05 DIAGNOSIS — Z96659 Presence of unspecified artificial knee joint: Secondary | ICD-10-CM

## 2022-04-05 DIAGNOSIS — Z8249 Family history of ischemic heart disease and other diseases of the circulatory system: Secondary | ICD-10-CM

## 2022-04-05 DIAGNOSIS — M48061 Spinal stenosis, lumbar region without neurogenic claudication: Secondary | ICD-10-CM | POA: Diagnosis present

## 2022-04-05 DIAGNOSIS — Z9851 Tubal ligation status: Secondary | ICD-10-CM | POA: Diagnosis not present

## 2022-04-05 DIAGNOSIS — Z8774 Personal history of (corrected) congenital malformations of heart and circulatory system: Secondary | ICD-10-CM

## 2022-04-05 DIAGNOSIS — Z79899 Other long term (current) drug therapy: Secondary | ICD-10-CM

## 2022-04-05 DIAGNOSIS — Z888 Allergy status to other drugs, medicaments and biological substances status: Secondary | ICD-10-CM

## 2022-04-05 DIAGNOSIS — E538 Deficiency of other specified B group vitamins: Secondary | ICD-10-CM | POA: Diagnosis present

## 2022-04-05 DIAGNOSIS — Z79890 Hormone replacement therapy (postmenopausal): Secondary | ICD-10-CM

## 2022-04-05 DIAGNOSIS — Z951 Presence of aortocoronary bypass graft: Secondary | ICD-10-CM

## 2022-04-05 DIAGNOSIS — M810 Age-related osteoporosis without current pathological fracture: Secondary | ICD-10-CM | POA: Diagnosis present

## 2022-04-05 DIAGNOSIS — Z96651 Presence of right artificial knee joint: Principal | ICD-10-CM

## 2022-04-05 DIAGNOSIS — Z7984 Long term (current) use of oral hypoglycemic drugs: Secondary | ICD-10-CM

## 2022-04-05 DIAGNOSIS — Z7982 Long term (current) use of aspirin: Secondary | ICD-10-CM | POA: Diagnosis not present

## 2022-04-05 DIAGNOSIS — M1711 Unilateral primary osteoarthritis, right knee: Principal | ICD-10-CM | POA: Diagnosis present

## 2022-04-05 DIAGNOSIS — E785 Hyperlipidemia, unspecified: Secondary | ICD-10-CM | POA: Diagnosis present

## 2022-04-05 DIAGNOSIS — I110 Hypertensive heart disease with heart failure: Secondary | ICD-10-CM | POA: Diagnosis present

## 2022-04-05 DIAGNOSIS — E119 Type 2 diabetes mellitus without complications: Secondary | ICD-10-CM | POA: Diagnosis present

## 2022-04-05 DIAGNOSIS — I272 Pulmonary hypertension, unspecified: Secondary | ICD-10-CM | POA: Diagnosis present

## 2022-04-05 DIAGNOSIS — I5022 Chronic systolic (congestive) heart failure: Secondary | ICD-10-CM | POA: Diagnosis present

## 2022-04-05 DIAGNOSIS — Z9229 Personal history of other drug therapy: Secondary | ICD-10-CM

## 2022-04-05 DIAGNOSIS — Z8673 Personal history of transient ischemic attack (TIA), and cerebral infarction without residual deficits: Secondary | ICD-10-CM | POA: Diagnosis not present

## 2022-04-05 DIAGNOSIS — M419 Scoliosis, unspecified: Secondary | ICD-10-CM | POA: Diagnosis present

## 2022-04-05 DIAGNOSIS — Z87891 Personal history of nicotine dependence: Secondary | ICD-10-CM

## 2022-04-05 DIAGNOSIS — I4891 Unspecified atrial fibrillation: Secondary | ICD-10-CM | POA: Diagnosis present

## 2022-04-05 DIAGNOSIS — K219 Gastro-esophageal reflux disease without esophagitis: Secondary | ICD-10-CM | POA: Diagnosis present

## 2022-04-05 DIAGNOSIS — E039 Hypothyroidism, unspecified: Secondary | ICD-10-CM | POA: Diagnosis present

## 2022-04-05 DIAGNOSIS — Z7901 Long term (current) use of anticoagulants: Secondary | ICD-10-CM

## 2022-04-05 DIAGNOSIS — I2510 Atherosclerotic heart disease of native coronary artery without angina pectoris: Secondary | ICD-10-CM | POA: Diagnosis present

## 2022-04-05 DIAGNOSIS — Z823 Family history of stroke: Secondary | ICD-10-CM

## 2022-04-05 HISTORY — DX: Migraine, unspecified, not intractable, without status migrainosus: G43.909

## 2022-04-05 HISTORY — DX: Cor triatriatum: Q24.2

## 2022-04-05 HISTORY — DX: Cardiac murmur, unspecified: R01.1

## 2022-04-05 HISTORY — DX: Unspecified cataract: H26.9

## 2022-04-05 HISTORY — DX: Pulmonary hypertension, unspecified: I27.20

## 2022-04-05 HISTORY — DX: Long term (current) use of anticoagulants: Z79.01

## 2022-04-05 HISTORY — DX: Spinal stenosis, lumbar region without neurogenic claudication: M48.061

## 2022-04-05 HISTORY — DX: Scoliosis, unspecified: M41.9

## 2022-04-05 HISTORY — DX: Ischemic cardiomyopathy: I25.5

## 2022-04-05 HISTORY — DX: Type 2 diabetes mellitus without complications: E11.9

## 2022-04-05 HISTORY — DX: Other forms of dyspnea: R06.09

## 2022-04-05 HISTORY — DX: Unspecified systolic (congestive) heart failure: I50.20

## 2022-04-05 HISTORY — PX: TOTAL KNEE ARTHROPLASTY: SHX125

## 2022-04-05 LAB — PROTIME-INR
INR: 1.1 (ref 0.8–1.2)
Prothrombin Time: 14.1 seconds (ref 11.4–15.2)

## 2022-04-05 LAB — CBC
HCT: 34.3 % — ABNORMAL LOW (ref 36.0–46.0)
Hemoglobin: 11.2 g/dL — ABNORMAL LOW (ref 12.0–15.0)
MCH: 32 pg (ref 26.0–34.0)
MCHC: 32.7 g/dL (ref 30.0–36.0)
MCV: 98 fL (ref 80.0–100.0)
Platelets: 92 10*3/uL — ABNORMAL LOW (ref 150–400)
RBC: 3.5 MIL/uL — ABNORMAL LOW (ref 3.87–5.11)
RDW: 12.8 % (ref 11.5–15.5)
WBC: 3.8 10*3/uL — ABNORMAL LOW (ref 4.0–10.5)
nRBC: 0 % (ref 0.0–0.2)

## 2022-04-05 LAB — GLUCOSE, CAPILLARY
Glucose-Capillary: 126 mg/dL — ABNORMAL HIGH (ref 70–99)
Glucose-Capillary: 192 mg/dL — ABNORMAL HIGH (ref 70–99)
Glucose-Capillary: 200 mg/dL — ABNORMAL HIGH (ref 70–99)
Glucose-Capillary: 152 mg/dL — ABNORMAL HIGH (ref 70–99)

## 2022-04-05 LAB — CREATININE, SERUM
Creatinine, Ser: 1.18 mg/dL — ABNORMAL HIGH (ref 0.44–1.00)
GFR, Estimated: 51 mL/min — ABNORMAL LOW (ref >60)

## 2022-04-05 SURGERY — TOTAL KNEE ARTHROPLASTY
Anesthesia: General | Site: Knee | Laterality: Right

## 2022-04-05 MED ORDER — METHOCARBAMOL 1000 MG/10ML IJ SOLN
500.0000 mg | Freq: Four times a day (QID) | INTRAVENOUS | Status: DC | PRN
  Filled 2022-04-05: qty 5

## 2022-04-05 MED ORDER — MORPHINE SULFATE (PF) 10 MG/ML IV SOLN
INTRAVENOUS | Status: AC
  Filled 2022-04-05: qty 1

## 2022-04-05 MED ORDER — PROPOFOL 10 MG/ML IV BOLUS
INTRAVENOUS | Status: AC
  Filled 2022-04-05: qty 20

## 2022-04-05 MED ORDER — VITAMIN B-12 1000 MCG PO TABS
1000.0000 ug | ORAL_TABLET | Freq: Every day | ORAL | Status: DC
  Administered 2022-04-06 – 2022-04-07 (×2): 1000 ug via ORAL
  Filled 2022-04-05 (×2): qty 1

## 2022-04-05 MED ORDER — INSULIN ASPART 100 UNIT/ML IJ SOLN
0.0000 [IU] | Freq: Three times a day (TID) | INTRAMUSCULAR | Status: DC
  Administered 2022-04-05: 3 [IU] via SUBCUTANEOUS
  Administered 2022-04-06: 1 [IU] via SUBCUTANEOUS
  Administered 2022-04-06 – 2022-04-07 (×2): 3 [IU] via SUBCUTANEOUS
  Filled 2022-04-05 (×4): qty 1

## 2022-04-05 MED ORDER — NEOMYCIN-POLYMYXIN B GU 40-200000 IR SOLN
Status: AC
  Filled 2022-04-05: qty 20

## 2022-04-05 MED ORDER — PHENYLEPHRINE HCL (PRESSORS) 10 MG/ML IV SOLN
INTRAVENOUS | Status: DC | PRN
Start: 2022-04-05 — End: 2022-04-05
  Administered 2022-04-05: 80 ug via INTRAVENOUS

## 2022-04-05 MED ORDER — METOCLOPRAMIDE HCL 5 MG PO TABS
5.0000 mg | ORAL_TABLET | Freq: Three times a day (TID) | ORAL | Status: DC | PRN
  Filled 2022-04-05: qty 2

## 2022-04-05 MED ORDER — ONDANSETRON HCL 4 MG/2ML IJ SOLN: 4.0000 mg | Freq: Once | INTRAMUSCULAR | Status: DC | PRN

## 2022-04-05 MED ORDER — PROPOFOL 1000 MG/100ML IV EMUL
INTRAVENOUS | Status: AC
  Filled 2022-04-05: qty 200

## 2022-04-05 MED ORDER — MAGNESIUM HYDROXIDE 400 MG/5ML PO SUSP
30.0000 mL | Freq: Every day | ORAL | Status: DC
  Administered 2022-04-05 – 2022-04-06 (×2): 30 mL via ORAL
  Filled 2022-04-05 (×2): qty 30

## 2022-04-05 MED ORDER — ZOLPIDEM TARTRATE 5 MG PO TABS: 5.0000 mg | ORAL_TABLET | Freq: Every evening | ORAL | Status: DC | PRN

## 2022-04-05 MED ORDER — WARFARIN SODIUM 3 MG PO TABS
3.0000 mg | ORAL_TABLET | ORAL | Status: DC
  Administered 2022-04-05 – 2022-04-06 (×2): 3 mg via ORAL
  Filled 2022-04-05 (×3): qty 1

## 2022-04-05 MED ORDER — FUROSEMIDE 40 MG PO TABS
80.0000 mg | ORAL_TABLET | Freq: Every day | ORAL | Status: DC
Start: 2022-04-06 — End: 2022-04-07
  Administered 2022-04-06 – 2022-04-07 (×2): 80 mg via ORAL
  Filled 2022-04-05 (×2): qty 2

## 2022-04-05 MED ORDER — FLUTICASONE PROPIONATE 50 MCG/ACT NA SUSP
1.0000 | Freq: Every day | NASAL | Status: DC
  Administered 2022-04-05 – 2022-04-06 (×2): 1 via NASAL
  Filled 2022-04-05: qty 16

## 2022-04-05 MED ORDER — LACTATED RINGERS IV SOLN: INTRAVENOUS | Status: DC

## 2022-04-05 MED ORDER — PRAVASTATIN SODIUM 20 MG PO TABS
10.0000 mg | ORAL_TABLET | Freq: Every day | ORAL | Status: DC
  Administered 2022-04-05 – 2022-04-06 (×2): 10 mg via ORAL
  Filled 2022-04-05 (×2): qty 1

## 2022-04-05 MED ORDER — LEVOTHYROXINE SODIUM 50 MCG PO TABS
50.0000 ug | ORAL_TABLET | Freq: Every day | ORAL | Status: DC
  Administered 2022-04-06 – 2022-04-07 (×2): 50 ug via ORAL
  Filled 2022-04-05 (×2): qty 1

## 2022-04-05 MED ORDER — ASPIRIN EC 81 MG PO TBEC
81.0000 mg | DELAYED_RELEASE_TABLET | Freq: Every day | ORAL | Status: DC
  Administered 2022-04-06 – 2022-04-07 (×2): 81 mg via ORAL
  Filled 2022-04-05 (×2): qty 1

## 2022-04-05 MED ORDER — CARVEDILOL 3.125 MG PO TABS
3.1250 mg | ORAL_TABLET | Freq: Two times a day (BID) | ORAL | Status: DC
  Administered 2022-04-05 – 2022-04-07 (×3): 3.125 mg via ORAL
  Filled 2022-04-05 (×4): qty 1

## 2022-04-05 MED ORDER — ONDANSETRON HCL 4 MG PO TABS: 4.0000 mg | ORAL_TABLET | Freq: Four times a day (QID) | ORAL | Status: DC | PRN

## 2022-04-05 MED ORDER — ROCURONIUM BROMIDE 100 MG/10ML IV SOLN
INTRAVENOUS | Status: DC | PRN
  Administered 2022-04-05 (×2): 10 mg via INTRAVENOUS
  Administered 2022-04-05: 30 mg via INTRAVENOUS

## 2022-04-05 MED ORDER — ACETAMINOPHEN 10 MG/ML IV SOLN
INTRAVENOUS | Status: AC
  Filled 2022-04-05: qty 100

## 2022-04-05 MED ORDER — ONDANSETRON HCL 4 MG/2ML IJ SOLN
4.0000 mg | Freq: Four times a day (QID) | INTRAMUSCULAR | Status: DC | PRN
  Administered 2022-04-07: 4 mg via INTRAVENOUS
  Filled 2022-04-05: qty 2

## 2022-04-05 MED ORDER — FENTANYL CITRATE (PF) 100 MCG/2ML IJ SOLN
25.0000 ug | INTRAMUSCULAR | Status: DC | PRN
  Administered 2022-04-05: 25 ug via INTRAVENOUS

## 2022-04-05 MED ORDER — PROPOFOL 1000 MG/100ML IV EMUL
INTRAVENOUS | Status: AC
  Filled 2022-04-05: qty 100

## 2022-04-05 MED ORDER — MIDAZOLAM HCL 2 MG/2ML IJ SOLN
INTRAMUSCULAR | Status: AC
  Filled 2022-04-05: qty 2

## 2022-04-05 MED ORDER — ACETAMINOPHEN 10 MG/ML IV SOLN: 1000.0000 mg | Freq: Once | INTRAVENOUS | Status: DC | PRN

## 2022-04-05 MED ORDER — OXYCODONE HCL 5 MG PO TABS: 5.0000 mg | ORAL_TABLET | Freq: Once | ORAL | Status: DC | PRN

## 2022-04-05 MED ORDER — BUPIVACAINE LIPOSOME 1.3 % IJ SUSP
INTRAMUSCULAR | Status: AC
  Filled 2022-04-05: qty 40

## 2022-04-05 MED ORDER — MORPHINE SULFATE (PF) 2 MG/ML IV SOLN: 0.5000 mg | INTRAVENOUS | Status: DC | PRN

## 2022-04-05 MED ORDER — LIDOCAINE HCL (PF) 2 % IJ SOLN
INTRAMUSCULAR | Status: AC
  Filled 2022-04-05: qty 5

## 2022-04-05 MED ORDER — FENTANYL CITRATE (PF) 100 MCG/2ML IJ SOLN
INTRAMUSCULAR | Status: DC | PRN
Start: 2022-04-05 — End: 2022-04-05
  Administered 2022-04-05 (×4): 25 ug via INTRAVENOUS

## 2022-04-05 MED ORDER — METFORMIN HCL 500 MG PO TABS
1000.0000 mg | ORAL_TABLET | Freq: Two times a day (BID) | ORAL | Status: DC
  Administered 2022-04-05 – 2022-04-07 (×4): 1000 mg via ORAL
  Filled 2022-04-05 (×4): qty 2

## 2022-04-05 MED ORDER — ENOXAPARIN SODIUM 30 MG/0.3ML IJ SOSY
30.0000 mg | PREFILLED_SYRINGE | Freq: Two times a day (BID) | INTRAMUSCULAR | Status: DC
  Administered 2022-04-06 – 2022-04-07 (×3): 30 mg via SUBCUTANEOUS
  Filled 2022-04-05 (×3): qty 0.3

## 2022-04-05 MED ORDER — PANTOPRAZOLE SODIUM 40 MG PO TBEC
40.0000 mg | DELAYED_RELEASE_TABLET | Freq: Every day | ORAL | Status: DC
  Administered 2022-04-06 – 2022-04-07 (×2): 40 mg via ORAL
  Filled 2022-04-05 (×2): qty 1

## 2022-04-05 MED ORDER — LACTATED RINGERS IV SOLN: INTRAVENOUS | Status: DC | PRN

## 2022-04-05 MED ORDER — PHENYLEPHRINE HCL-NACL 20-0.9 MG/250ML-% IV SOLN
INTRAVENOUS | Status: DC | PRN
  Administered 2022-04-05: 40 ug/min via INTRAVENOUS

## 2022-04-05 MED ORDER — LATANOPROST 0.005 % OP SOLN
1.0000 [drp] | Freq: Every day | OPHTHALMIC | Status: DC
  Administered 2022-04-05 – 2022-04-06 (×2): 1 [drp] via OPHTHALMIC
  Filled 2022-04-05: qty 2.5

## 2022-04-05 MED ORDER — ACETAMINOPHEN 10 MG/ML IV SOLN
INTRAVENOUS | Status: DC | PRN
  Administered 2022-04-05: 1000 mg via INTRAVENOUS

## 2022-04-05 MED ORDER — CEFAZOLIN SODIUM-DEXTROSE 1-4 GM/50ML-% IV SOLN
1.0000 g | Freq: Four times a day (QID) | INTRAVENOUS | Status: AC
  Administered 2022-04-05: 1 g via INTRAVENOUS
  Filled 2022-04-05 (×2): qty 50

## 2022-04-05 MED ORDER — TRAMADOL HCL 50 MG PO TABS
50.0000 mg | ORAL_TABLET | Freq: Four times a day (QID) | ORAL | Status: DC
  Administered 2022-04-05 – 2022-04-07 (×8): 50 mg via ORAL
  Filled 2022-04-05 (×9): qty 1

## 2022-04-05 MED ORDER — PHENYLEPHRINE HCL-NACL 20-0.9 MG/250ML-% IV SOLN
INTRAVENOUS | Status: AC
  Filled 2022-04-05: qty 250

## 2022-04-05 MED ORDER — ALUM & MAG HYDROXIDE-SIMETH 200-200-20 MG/5ML PO SUSP: 30.0000 mL | ORAL | Status: DC | PRN

## 2022-04-05 MED ORDER — LIDOCAINE HCL (CARDIAC) PF 100 MG/5ML IV SOSY
PREFILLED_SYRINGE | INTRAVENOUS | Status: DC | PRN
  Administered 2022-04-05: 60 mg via INTRAVENOUS

## 2022-04-05 MED ORDER — CHLORHEXIDINE GLUCONATE 0.12 % MT SOLN
15.0000 mL | Freq: Once | OROMUCOSAL | Status: AC
  Administered 2022-04-05: 15 mL via OROMUCOSAL

## 2022-04-05 MED ORDER — SODIUM CHLORIDE 0.9 % IR SOLN: Status: DC | PRN

## 2022-04-05 MED ORDER — METHOCARBAMOL 500 MG PO TABS
500.0000 mg | ORAL_TABLET | Freq: Four times a day (QID) | ORAL | Status: DC | PRN
  Administered 2022-04-06 – 2022-04-07 (×2): 500 mg via ORAL
  Filled 2022-04-05 (×2): qty 1

## 2022-04-05 MED ORDER — MENTHOL 3 MG MT LOZG: 1.0000 | LOZENGE | OROMUCOSAL | Status: DC | PRN

## 2022-04-05 MED ORDER — DOCUSATE SODIUM 100 MG PO CAPS
100.0000 mg | ORAL_CAPSULE | Freq: Two times a day (BID) | ORAL | Status: DC
  Administered 2022-04-05 – 2022-04-07 (×4): 100 mg via ORAL
  Filled 2022-04-05 (×4): qty 1

## 2022-04-05 MED ORDER — ORAL CARE MOUTH RINSE: 15.0000 mL | Freq: Once | OROMUCOSAL | Status: AC

## 2022-04-05 MED ORDER — FLEET ENEMA 7-19 GM/118ML RE ENEM: 1.0000 | ENEMA | Freq: Once | RECTAL | Status: DC | PRN

## 2022-04-05 MED ORDER — BISACODYL 5 MG PO TBEC
5.0000 mg | DELAYED_RELEASE_TABLET | Freq: Every day | ORAL | Status: DC | PRN
  Administered 2022-04-06: 5 mg via ORAL
  Filled 2022-04-05: qty 1

## 2022-04-05 MED ORDER — POLYETHYLENE GLYCOL 3350 17 G PO PACK
17.0000 g | PACK | Freq: Every day | ORAL | Status: DC | PRN
  Administered 2022-04-06: 17 g via ORAL
  Filled 2022-04-05: qty 1

## 2022-04-05 MED ORDER — CEFAZOLIN SODIUM-DEXTROSE 2-4 GM/100ML-% IV SOLN
INTRAVENOUS | Status: AC
  Filled 2022-04-05: qty 100

## 2022-04-05 MED ORDER — DEXMEDETOMIDINE (PRECEDEX) IN NS 20 MCG/5ML (4 MCG/ML) IV SYRINGE
PREFILLED_SYRINGE | INTRAVENOUS | Status: DC | PRN
  Administered 2022-04-05: 8 ug via INTRAVENOUS
  Administered 2022-04-05: 4 ug via INTRAVENOUS

## 2022-04-05 MED ORDER — WARFARIN SODIUM 4 MG PO TABS: 4.5000 mg | ORAL_TABLET | ORAL | Status: DC

## 2022-04-05 MED ORDER — HYDROMORPHONE HCL 1 MG/ML IJ SOLN
INTRAMUSCULAR | Status: AC
  Filled 2022-04-05: qty 1

## 2022-04-05 MED ORDER — ONDANSETRON HCL 4 MG/2ML IJ SOLN
INTRAMUSCULAR | Status: DC | PRN
  Administered 2022-04-05: 4 mg via INTRAVENOUS

## 2022-04-05 MED ORDER — PROPOFOL 10 MG/ML IV BOLUS
INTRAVENOUS | Status: DC | PRN
  Administered 2022-04-05: 100 mg via INTRAVENOUS

## 2022-04-05 MED ORDER — SODIUM CHLORIDE (PF) 0.9 % IJ SOLN
INTRAMUSCULAR | Status: DC | PRN
  Administered 2022-04-05: 91 mL via INTRAMUSCULAR

## 2022-04-05 MED ORDER — BUPIVACAINE-EPINEPHRINE (PF) 0.25% -1:200000 IJ SOLN
INTRAMUSCULAR | Status: AC
  Filled 2022-04-05: qty 60

## 2022-04-05 MED ORDER — FENTANYL CITRATE (PF) 100 MCG/2ML IJ SOLN
INTRAMUSCULAR | Status: AC
  Filled 2022-04-05: qty 2

## 2022-04-05 MED ORDER — CHLORHEXIDINE GLUCONATE 0.12 % MT SOLN
OROMUCOSAL | Status: AC
  Filled 2022-04-05: qty 15

## 2022-04-05 MED ORDER — SURGIPHOR WOUND IRRIGATION SYSTEM - OPTIME
TOPICAL | Status: DC | PRN
Start: 2022-04-05 — End: 2022-04-05

## 2022-04-05 MED ORDER — ALBUTEROL SULFATE (2.5 MG/3ML) 0.083% IN NEBU: 2.5000 mg | INHALATION_SOLUTION | Freq: Four times a day (QID) | RESPIRATORY_TRACT | Status: DC | PRN

## 2022-04-05 MED ORDER — SODIUM CHLORIDE 0.9 % IV SOLN: INTRAVENOUS | Status: DC

## 2022-04-05 MED ORDER — PHENOL 1.4 % MT LIQD: 1.0000 | OROMUCOSAL | Status: DC | PRN

## 2022-04-05 MED ORDER — FENTANYL CITRATE (PF) 100 MCG/2ML IJ SOLN
INTRAMUSCULAR | Status: AC
  Administered 2022-04-05: 25 ug via INTRAVENOUS
  Filled 2022-04-05: qty 2

## 2022-04-05 MED ORDER — ONDANSETRON HCL 4 MG/2ML IJ SOLN
INTRAMUSCULAR | Status: AC
  Filled 2022-04-05: qty 2

## 2022-04-05 MED ORDER — CEFAZOLIN SODIUM-DEXTROSE 2-4 GM/100ML-% IV SOLN
2.0000 g | INTRAVENOUS | Status: AC
  Administered 2022-04-05: 2 g via INTRAVENOUS

## 2022-04-05 MED ORDER — SUGAMMADEX SODIUM 200 MG/2ML IV SOLN
INTRAVENOUS | Status: DC | PRN
  Administered 2022-04-05: 200 mg via INTRAVENOUS

## 2022-04-05 MED ORDER — ACETAMINOPHEN 325 MG PO TABS: 325.0000 mg | ORAL_TABLET | Freq: Four times a day (QID) | ORAL | Status: DC | PRN

## 2022-04-05 MED ORDER — DEXAMETHASONE SODIUM PHOSPHATE 10 MG/ML IJ SOLN
INTRAMUSCULAR | Status: DC | PRN
Start: 2022-04-05 — End: 2022-04-05
  Administered 2022-04-05: 10 mg via INTRAVENOUS

## 2022-04-05 MED ORDER — SODIUM CHLORIDE FLUSH 0.9 % IV SOLN
INTRAVENOUS | Status: AC
  Filled 2022-04-05: qty 80

## 2022-04-05 MED ORDER — MIDAZOLAM HCL 2 MG/2ML IJ SOLN
INTRAMUSCULAR | Status: DC | PRN
  Administered 2022-04-05: 1 mg via INTRAVENOUS

## 2022-04-05 MED ORDER — HYDROMORPHONE HCL 1 MG/ML IJ SOLN
INTRAMUSCULAR | Status: DC | PRN
  Administered 2022-04-05: .5 mg via INTRAVENOUS

## 2022-04-05 MED ORDER — SACUBITRIL-VALSARTAN 24-26 MG PO TABS
1.0000 | ORAL_TABLET | Freq: Two times a day (BID) | ORAL | Status: DC
  Filled 2022-04-05 (×3): qty 1

## 2022-04-05 MED ORDER — SPIRONOLACTONE 25 MG PO TABS
25.0000 mg | ORAL_TABLET | Freq: Every day | ORAL | Status: DC
  Administered 2022-04-06 – 2022-04-07 (×2): 25 mg via ORAL
  Filled 2022-04-05 (×2): qty 1

## 2022-04-05 MED ORDER — HYDROCODONE-ACETAMINOPHEN 5-325 MG PO TABS
1.0000 | ORAL_TABLET | ORAL | Status: DC | PRN
  Administered 2022-04-06: 1 via ORAL
  Administered 2022-04-06 (×2): 2 via ORAL
  Filled 2022-04-05: qty 2
  Filled 2022-04-05: qty 1
  Filled 2022-04-05: qty 2

## 2022-04-05 MED ORDER — HYDROCODONE-ACETAMINOPHEN 7.5-325 MG PO TABS
1.0000 | ORAL_TABLET | ORAL | Status: DC | PRN
  Administered 2022-04-07: 2 via ORAL
  Filled 2022-04-05: qty 2

## 2022-04-05 MED ORDER — OXYCODONE HCL 5 MG/5ML PO SOLN: 5.0000 mg | Freq: Once | ORAL | Status: DC | PRN

## 2022-04-05 MED ORDER — WARFARIN SODIUM 3 MG PO TABS: 3.0000 mg | ORAL_TABLET | ORAL | Status: DC

## 2022-04-05 MED ORDER — METOCLOPRAMIDE HCL 5 MG/ML IJ SOLN: 5.0000 mg | Freq: Three times a day (TID) | INTRAMUSCULAR | Status: DC | PRN

## 2022-04-05 SURGICAL SUPPLY — 70 items
BLADE SAGITTAL 25.0X1.19X90 (BLADE) ×2
BLADE SAW 90X13X1.19 OSCILLAT (BLADE) ×2
BLOCK CUTTING FEMUR 2 RT MED (MISCELLANEOUS) ×2
BLOCK CUTTING TIBIAL 2 RT (MISCELLANEOUS) ×2
BLOCK CUTTING TIBIAL 4 RT MIS (MISCELLANEOUS) ×2
BNDG ELASTIC 6X5.8 VLCR STR LF (GAUZE/BANDAGES/DRESSINGS) ×2
CANISTER WOUND CARE 500ML ATS (WOUND CARE) ×2
CEMENT HV SMART SET (Cement) ×4 IMPLANT
CHLORAPREP W/TINT 26 (MISCELLANEOUS) ×4
COOLER POLAR GLACIER W/PUMP (MISCELLANEOUS) ×2
CUFF TOURN SGL QUICK 24 (TOURNIQUET CUFF)
CUFF TOURN SGL QUICK 34 (TOURNIQUET CUFF)
CUFF TRNQT CYL 24X4X16.5-23 (TOURNIQUET CUFF)
CUFF TRNQT CYL 34X4.125X (TOURNIQUET CUFF)
DRAPE 3/4 80X56 (DRAPES) ×4
DRSG MEPILEX SACRM 8.7X9.8 (GAUZE/BANDAGES/DRESSINGS) ×2
ELECT CAUTERY BLADE 6.4 (BLADE) ×2
ELECT REM PT RETURN 9FT ADLT (ELECTROSURGICAL) ×2
FEMORAL COMPONENT PS R S2N (Femur) ×2 IMPLANT
FEMUR BONE MODEL (MISCELLANEOUS) ×2
GAUZE SPONGE 4X4 12PLY STRL (GAUZE/BANDAGES/DRESSINGS) ×2
GAUZE XEROFORM 1X8 LF (GAUZE/BANDAGES/DRESSINGS)
GLOVE SURG ORTHO 8.0 STRL STRW (GLOVE) ×2
GLOVE SURG SYN 9.0  PF PI (GLOVE) ×1
GLOVE SURG SYN 9.0 PF PI (GLOVE) ×1
GLOVE SURG UNDER LTX SZ8 (GLOVE) ×2
GLOVE SURG UNDER POLY LF SZ9 (GLOVE) ×2
GOWN SRG 2XL LVL 4 RGLN SLV (GOWNS) ×1
GOWN STRL NON-REIN 2XL LVL4 (GOWNS) ×1
GOWN STRL REUS W/ TWL LRG LVL3 (GOWN DISPOSABLE) ×1
GOWN STRL REUS W/ TWL XL LVL3 (GOWN DISPOSABLE) ×1
GOWN STRL REUS W/TWL LRG LVL3 (GOWN DISPOSABLE) ×1
GOWN STRL REUS W/TWL XL LVL3 (GOWN DISPOSABLE) ×1
HOLDER FOLEY CATH W/STRAP (MISCELLANEOUS)
HOOD PEEL AWAY FLYTE STAYCOOL (MISCELLANEOUS) ×4
IV NS IRRIG 3000ML ARTHROMATIC (IV SOLUTION) ×2
KIT PREVENA INCISION MGT20CM45 (CANNISTER) ×2
KIT TURNOVER KIT A (KITS) ×2
KNEE INSERT TIBIAL S2 02070212 (Insert) ×2 IMPLANT
MANIFOLD NEPTUNE II (INSTRUMENTS) ×4
NDL SAFETY ECLIPSE 18X1.5 (NEEDLE) ×1
NDL SPNL 18GX3.5 QUINCKE PK (NEEDLE) ×1 IMPLANT
NDL SPNL 20GX3.5 QUINCKE YW (NEEDLE) ×1 IMPLANT
NEEDLE HYPO 18GX1.5 SHARP (NEEDLE) ×1
NEEDLE SPNL 18GX3.5 QUINCKE PK (NEEDLE) ×2
NEEDLE SPNL 20GX3.5 QUINCKE YW (NEEDLE) ×2
NS IRRIG 1000ML POUR BTL (IV SOLUTION) ×2
PACK TOTAL KNEE (MISCELLANEOUS) ×2
PATELLA RESURFACING MEDACTA 02 (Bone Implant) ×2 IMPLANT
PENCIL SMOKE EVACUATOR COATED (MISCELLANEOUS)
PULSAVAC PLUS IRRIG FAN TIP (DISPOSABLE) ×2
SCALPEL PROTECTED #10 DISP (BLADE) ×4
SOLUTION IRRIG SURGIPHOR (IV SOLUTION) ×2
SOLUTION PRONTOSAN WOUND 350ML (IRRIGATION / IRRIGATOR)
STAPLER SKIN PROX 35W (STAPLE) ×2
STEM EXTENSION 11MMX30MM (Stem) ×2 IMPLANT
SUCTION FRAZIER HANDLE 10FR (MISCELLANEOUS) ×1
SUCTION TUBE FRAZIER 10FR DISP (MISCELLANEOUS) ×1
SUT DVC 2 QUILL PDO  T11 36X36 (SUTURE) ×1
SUT DVC 2 QUILL PDO T11 36X36 (SUTURE) ×1
SUT ETHIBOND 2 V 37 (SUTURE) ×2
SUT V-LOC 90 ABS DVC 3-0 CL (SUTURE) ×2
SYR 20ML LL LF (SYRINGE) ×2
SYR 50ML LL SCALE MARK (SYRINGE) ×4
TIB TRAY SZ 2 R FIXED (Joint) ×2 IMPLANT
TOWEL OR 17X26 4PK STRL BLUE (TOWEL DISPOSABLE)
TOWER CARTRIDGE SMART MIX (DISPOSABLE) ×2
TRAY FOLEY MTR SLVR 16FR STAT (SET/KITS/TRAYS/PACK)
WATER STERILE IRR 1000ML POUR (IV SOLUTION) ×2
WRAPON POLAR PAD KNEE (MISCELLANEOUS) ×2

## 2022-04-05 NOTE — Transfer of Care (Signed)
Immediate Anesthesia Transfer of Care Note ? ?Patient: Tammy Arias ? ?Procedure(s) Performed: TOTAL KNEE ARTHROPLASTY (Right: Knee) ? ?Patient Location: PACU ? ?Anesthesia Type:General ? ?Level of Consciousness: awake ? ?Airway & Oxygen Therapy: Patient Spontanous Breathing and Patient connected to face mask ? ?Post-op Assessment: Report given to RN and Post -op Vital signs reviewed and stable ? ?Post vital signs: Reviewed and stable ? ?Last Vitals:  ?Vitals Value Taken Time  ?BP 100/61 04/05/22 0920  ?Temp    ?Pulse 69 04/05/22 0920  ?Resp 13 04/05/22 0920  ?SpO2 98 % 04/05/22 0920  ? ? ?Last Pain:  ?Vitals:  ? 04/05/22 0618  ?TempSrc: Oral  ?PainSc: 3   ?   ? ?  ? ?Complications: No notable events documented. ?

## 2022-04-05 NOTE — Progress Notes (Signed)
Met with the patient in the room at the bedside ?The patient lives at Home alone, she will be staying with a friend for a couple weeks ?The patient  currently has no DME ?The patient will need RW to be delivered by [employer] to the bedside ?They have transportation with her friend ?They can afford their medication ? ?They are set up with [facility] for Home health services ? ? ? ?

## 2022-04-05 NOTE — Evaluation (Signed)
Physical Therapy Evaluation ?Patient Details ?Name: Tammy Arias ?MRN: [id number] ?DOB: 1956/11/12 ?Today's Date: 04/05/2022 ? ?History of Present Illness ? Patient is a 66 year old female s/p R total knee arthroplasty  ?Clinical Impression ? Physical Therapy session completed this date. Patient tolerated session well and was agreeable to treatment. Upon entry into room patient was resting in bed, with only mild pain reported. Patient states she lives in an entry level apartment by herself, however will be staying with her friend that lives in a 1 story home with ~6STE and BHRs. At baseline patient was Mod I/Independent with all ADLs and Mobility, ambulating with a quad cane.  ? ?During evaluation, patient demonstrated decreased ROM and strength on the RLE due to surgical limb. Patient was able to actively bend R knee to 60 degrees, and demonstrated at least 3/5 strength. On the LLE, strength was at least 4-/5. Patient was able to demonstrate all bed mobility at SBA with no physical assistance required and only mild use of bed rails. Sit to stand and step pivot from EOB and BSC were completed at [facility] for safety and increased stability.  Mild unsteadiness noted with step pivot transfer due to continued anesthesia affects from surgery on the RLE. Set up assist for pericare required. HEP packet reviewed with patient however not performed. Patient would continue to benefit from skilled physical therapy in order to optimize patient's return to PLOF. Recommend HHPT upon discharge from acute hospitalization.  ?   ? ?Recommendations for follow up therapy are one component of a multi-disciplinary discharge planning process, led by the attending physician.  Recommendations may be updated based on patient status, additional functional criteria and insurance authorization. ? ?Follow Up Recommendations Home health PT ? ?  ?Assistance Recommended at Discharge Intermittent Supervision/Assistance  ?Patient can return home with the  following ? A little help with walking and/or transfers;Help with stairs or ramp for entrance;A little help with bathing/dressing/bathroom ? ?  ?Equipment Recommendations Rolling walker (2 wheels)  ?Recommendations for Other Services ?    ?  ?Functional Status Assessment Patient has had a recent decline in their functional status and demonstrates the ability to make significant improvements in function in a reasonable and predictable amount of time.  ? ?  ?Precautions / Restrictions Precautions ?Precautions: Fall ?Restrictions ?Weight Bearing Restrictions: Yes ?RLE Weight Bearing: Weight bearing as tolerated  ? ?  ? ?Mobility ? Bed Mobility ?Overal bed mobility: Needs Assistance ?Bed Mobility: Sit to Supine, Supine to Sit ?  ?  ?Supine to sit: Supervision, HOB elevated ?Sit to supine: Supervision, HOB elevated ?  ?General bed mobility comments: no physical assistance required however patient utilized bed rail for increased assistance ?Patient Response: Cooperative ? ?Transfers ?Overall transfer level: Needs assistance ?Equipment used: Rolling walker (2 wheels) ?Transfers: Sit to/from Stand, Bed to chair/wheelchair/BSC ?Sit to Stand: Min guard ?  ?Step pivot transfers: Min guard (EOB<>BSC) ?  ?  ?  ?  ?  ? ?Ambulation/Gait ?  ?  ?  ?  ?  ?  ?  ?General Gait Details: deferred for safety this session due to continues anesthesia affects ? ?Stairs ?  ?  ?  ?  ?  ? ?Wheelchair Mobility ?  ? ?Modified Rankin (Stroke Patients Only) ?  ? ?  ? ?Balance Overall balance assessment: Needs assistance ?Sitting-balance support: Bilateral upper extremity supported, Feet supported ?Sitting balance-Leahy Scale: Good ?Sitting balance - Comments: can each outside BOS ?  ?Standing balance support: Bilateral upper extremity  supported, During functional activity, Reliant on assistive device for balance ?Standing balance-Leahy Scale: Fair ?Standing balance comment: CGA for safety and stability ?  ?  ?  ?  ?  ?  ?  ?  ?  ?  ?  ?    ? ? ? ?Pertinent Vitals/Pain Pain Assessment ?Pain Assessment: 0-10 ?Pain Score: 4  ?Pain Descriptors / Indicators: Aching, Discomfort ?Pain Intervention(s): Monitored during session, Repositioned  ? ? ?Home Living Family/patient expects to be discharged to:: Private residence ?Living Arrangements: Alone ?Available Help at Discharge: Friend(s) ?Type of Home: Apartment ?Home Access: Level entry ?  ?  ?  ?Home Layout: One level ?Home Equipment: Cane - quad ?   ?  ?Prior Function Prior Level of Function : Independent/Modified Independent ?  ?  ?  ?  ?  ?  ?Mobility Comments: ambulates with a SPC at baseline ?  ?  ? ? ?Hand Dominance  ?   ? ?  ?Extremity/Trunk Assessment  ? Upper Extremity Assessment ?Upper Extremity Assessment: Overall WFL for tasks assessed ?  ? ?Lower Extremity Assessment ?Lower Extremity Assessment: Generalized weakness;RLE deficits/detail;LLE deficits/detail ?RLE Deficits / Details: decreased ROM and strength due to surgical limb- at least 3/5 ?RLE Sensation: decreased light touch ?LLE Deficits / Details: at least 4-/5 strength ?LLE Sensation: WNL ?  ? ?Cervical / Trunk Assessment ?Cervical / Trunk Assessment: Normal  ?Communication  ? Communication: HOH  ?Cognition Arousal/Alertness: Awake/alert ?Behavior During Therapy: [facility] for tasks assessed/performed ?Overall Cognitive Status: Within Functional Limits for tasks assessed ?  ?  ?  ?  ?  ?  ?  ?  ?  ?  ?  ?  ?  ?  ?  ?  ?General Comments: A&Ox3 self location and situation ?  ?  ? ?  ?General Comments   ? ?  ?Exercises Total Joint Exercises ?Goniometric ROM: 60degrees of knee flexion actively ?Other Exercises ?Other Exercises: patient was educated on role of PT in acute care setting, fall risk, WBing precautions, and d/c recommendations  ? ?Assessment/Plan  ?  ?PT Assessment Patient needs continued PT services  ?PT Problem List Decreased strength;Decreased range of motion;Decreased mobility;Decreased activity tolerance;Decreased balance;Pain ? ?    ?  ?PT Treatment Interventions DME instruction;Therapeutic activities;Gait training;Therapeutic exercise;Stair training;Balance training   ? ?PT Goals (Current goals can be found in the Care Plan section)  ?Acute Rehab PT Goals ?Patient Stated Goal: to go home ?PT Goal Formulation: With patient ?Time For Goal Achievement: 04/19/22 ?Potential to Achieve Goals: Good ? ?  ?Frequency BID ?  ? ? ?Co-evaluation   ?  ?  ?  ?  ? ? ?  ?AM-PAC PT "6 Clicks" Mobility  ?Outcome Measure Help needed turning from your back to your side while in a flat bed without using bedrails?: None ?Help needed moving from lying on your back to sitting on the side of a flat bed without using bedrails?: None ?Help needed moving to and from a bed to a chair (including a wheelchair)?: A Little ?Help needed standing up from a chair using your arms (e.g., wheelchair or bedside chair)?: A Little ?Help needed to walk in hospital room?: A Little ?Help needed climbing 3-5 steps with a railing? : A Little ?6 Click Score: 20 ? ?  ?End of Session Equipment Utilized During Treatment: Gait belt ?Activity Tolerance: Patient tolerated treatment well ?Patient left: in bed;with call bell/phone within reach;with bed alarm set ?Nurse Communication: Mobility status ?PT Visit Diagnosis: Unsteadiness on  feet (R26.81);Muscle weakness (generalized) (M62.81);Pain ?Pain - Right/Left: Right ?Pain - part of body: Knee ?  ? ?Time: 8004-4715 ?PT Time Calculation (min) (ACUTE ONLY): 31 min ? ? ?Charges:   PT Evaluation ?$PT Eval Low Complexity: 1 Low ?PT Treatments ?$Therapeutic Activity: 8-22 mins ?  ?   ? ? ?Iva Boop, PT  ?04/05/22. 1:23 PM ? ? ?

## 2022-04-05 NOTE — Op Note (Signed)
04/05/2022 ? ?9:14 AM ? ?PATIENT:  Tammy Arias  ? ?MRN: [id number] ? ?PRE-OPERATIVE DIAGNOSIS:  Primary localized osteoarthritis of right knee ?  ?POST-OPERATIVE DIAGNOSIS:  Same ?  ?PROCEDURE:  Procedure(s): ?Right TOTAL KNEE ARTHROPLASTY ?  ?SURGEON: Laurene Footman, MD ?  ?ASSISTANTS: Rachelle Hora, PA-C ?  ?ANESTHESIA:   general ?  ?EBL: 100 ?  ?BLOOD ADMINISTERED:none ?  ?DRAINS:  Incisional wound VAC   ?  ?LOCAL MEDICATIONS USED:  MARCAINE    and OTHER Exparel and morphine ?  ?SPECIMEN:  No Specimen ?  ?DISPOSITION OF SPECIMEN:  N/A ?  ?COUNTS:  YES ?  ?TOURNIQUET: 56 minutes at 300 mm Hg ?  ?IMPLANTS: Medacta  GMK PS system with 2N right femur, 2 right tibia with short stem and 12 mm PS insert.  Size 2 patella, all components cemented. ?  ?DICTATION: .Dragon Dictation   patient was brought to the operating room and general anesthesia was obtained.  After prepping and draping the right leg in sterile fashion, and after patient identification and timeout procedures were completed, tourniquet was raised after initial bone cuts and midline skin incision was made followed by medial parapatellar arthrotomy with moderate medial compartment osteoarthritis, very severe patellofemoral arthritis and very severe lateral compartment arthritis, partial synovectomy was also carried out.   The ACL and PCL and fat pad were excised along with anterior horns of the meniscus. The proximal tibia cutting guide from  the [facility] system was applied and the proximal tibia cut carried out.  The distal femoral cut was carried out in a similar fashion     The 2 femoral cutting guide applied with anterior posterior and chamfer cuts made along with notch cut for trochlea and PS drill.  The posterior horns of the menisci were removed at this point.   Injection of the above medication was carried out after the femoral and tibial cuts were carried out.  The 2 baseplate trial was placed pinned into position and proximal tibial preparation  carried out with drilling hand reaming and the keel punch followed by placement of the 2 femur and sizing the tibial insert size 12 millimeter gave the best fit with stability and full extension.  The distal femoral drill holes were made in the notch cut for the trochlear groove was then carried out with trials were then removed the patella was cut using the patellar cutting guide and it sized to a size 2 after drill holes have been made  The knee was irrigated with pulsatile lavage and the bony surfaces dried the tibial component was cemented into place first.  Excess cement was removed and the polyethylene insert placed with a torque screw placed with a torque screwdriver tightened.  The distal femoral component was placed and the knee was held in extension as the patellar button was clamped into place.  After the cement was set, excess cement was removed and the knee was again irrigated thoroughly thoroughly irrigated.  The tourniquet was let down and hemostasis checked with electrocautery. The arthrotomy was repaired with a heavy Quill suture,  followed by 3-0 V lock subcuticular closure, skin staples followed by incisional wound VAC and Polar Care.. ?  ?PLAN OF CARE: Admit for overnight observation ?  ?PATIENT DISPOSITION:  PACU - hemodynamically stable. ?  ?  ?   ?  ?  ?  ? ? ?

## 2022-04-05 NOTE — Plan of Care (Signed)
  Problem: Education: Goal: Knowledge of General Education information will improve Description Including pain rating scale, medication(s)/side effects and non-pharmacologic comfort measures Outcome: Progressing   Problem: Health Behavior/Discharge Planning: Goal: Ability to manage health-related needs will improve Outcome: Progressing   

## 2022-04-05 NOTE — Plan of Care (Signed)

## 2022-04-05 NOTE — Anesthesia Procedure Notes (Signed)
Procedure Name: Intubation ?Date/Time: 04/05/2022 7:24 AM ?Performed by: Loletha Grayer, CRNA ?Pre-anesthesia Checklist: Patient identified, Patient being monitored, Timeout performed, Emergency Drugs available and Suction available ?Patient Re-evaluated:Patient Re-evaluated prior to induction ?Oxygen Delivery Method: Circle system utilized ?Preoxygenation: Pre-oxygenation with 100% oxygen ?Induction Type: IV induction ?Ventilation: Mask ventilation without difficulty and Oral airway inserted - appropriate to patient size ?Laryngoscope Size: Mac and 3 ?Grade View: Grade I ?Tube type: Oral ?Tube size: 6.5 mm ?Number of attempts: 1 ?Airway Equipment and Method: Stylet ?Placement Confirmation: ETT inserted through vocal cords under direct vision, positive ETCO2 and breath sounds checked- equal and bilateral ?Secured at: 20 cm ?Tube secured with: Tape ?Dental Injury: Teeth and Oropharynx as per pre-operative assessment  ? ? ? ? ?

## 2022-04-05 NOTE — TOC Progression Note (Signed)
Transition of Care (TOC) - Progression Note  ? ? ?Patient Details  ?Name: Tammy Arias ?MRN: [id number] ?Date of Birth: [birth year] ? ?Transition of Care (TOC) CM/SW Contact  ?Conception Oms, RN ?Phone Number: ?04/05/2022, 2:22 PM ? ?Clinical Narrative:    ?The patient is set up with [facility] agency prior to surgery by  surgeons office ? ? ? ?  ?  ? ?Expected Discharge Plan and Services ?  ?  ?  ?  ?  ?                ?  ?  ?  ?  ?  ?  ?  ?  ?  ?  ? ? ?Social Determinants of Health (SDOH) Interventions ?  ? ?Readmission Risk Interventions ?   ? View : No data to display.  ?  ?  ?  ? ? ?

## 2022-04-05 NOTE — Anesthesia Preprocedure Evaluation (Addendum)
Anesthesia Evaluation  ?Patient identified by MRN, date of birth, ID band ?Patient awake ? ? ? ?Reviewed: ?Allergy & Precautions, NPO status , Patient's Chart, lab work & pertinent test results ? ?History of Anesthesia Complications ?Negative for: history of anesthetic complications ? ?Airway ?Mallampati: III ? ? ?Neck ROM: Full ? ? ? Dental ? ?(+) Lower Dentures, Upper Dentures ?  ?Pulmonary ?former smoker (quit greater than 30 years ago),  ?  ?Pulmonary exam normal ?breath sounds clear to auscultation ? ? ? ? ? ? Cardiovascular ?hypertension, + CAD (s/p CABG) and +CHF (ICM, EF 40%)  ?+ dysrhythmias (a fib on warfarin, last 4 days ago) + Valvular Problems/Murmurs (cor triatrium s/p repair)  ?Rhythm:Irregular Rate:Normal ?+ Systolic murmurs ?ECG 03/29/22:  ?Atrial fibrillation ?ST & T wave abnormality, consider inferolateral ischemia ? ?Myocardial perfusion 06/10/20:  ?LVEF= 51%  ?Regional wall?motion: ?reveals normal myocardial thickening and wall motion.  ?The overall quality of the study is good. ?  ?Artifacts noted: no  ?Left ventricular cavity: normal.  ?Perfusion Analysis: ?SPECT images demonstrate small perfusion abnormality of mild intensity is present in the inferior region on the stress images.  ?Defect type: ?Fixed ? ?Cardiac MRI 09/28/20:  ?1. The left ventricle is upper limits of normal in cavity size with normal wall thickness. Global systolic function is moderately reduced with an LV ejection fraction calculated at 42%. There is wall thinning and severe hypokinesis to akinesis of the basal-to-mid lateral wall. There is global mild-to-moderate hypokinesis of the remaining LV walls.  ?2. The right ventricle is mildly dilated in cavity size with normal wall thickness. Global RV systolic function is mild-moderately reduced with an RVEF calculated at 37%.  ?3. Both atria are severely enlarged. There is no obvious inter-atrial flow communication, and Qp:Qs is normal, however a  small PFO cannot?be ruled out by CMR.  ?4. The aortic valve is trileaflet in morphology. There is no significant aortic valve stenosis or regurgitation. There is a central jet of mild-moderate mitral regurgitation. There is also a central jet of moderate tricuspid regurgitation. There is mild pulmonic regurgitation.  ?5. Delayed enhancement imaging is abnormal. There is a transmural myocardial infarction involving the  ?basal-to-mid anterolateral and inferolateral LV walls, which is consistent with the LCx perfusion territory. There are areas of fatty metaplasia within the infarcted region.  ?6. Adenosine stress perfusion imaging demonstrates no evidence of inducible myocardial ischemia. ?  ?7. No intracardiac thrombus visualized.  ?8. The pericardium is normal in thickness. There is no significant pericardial effusion.  ?9. 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  ?  ?Neuro/Psych ? Headaches, TIA  ? GI/Hepatic ?GERD  ,  ?Endo/Other  ?diabetes, Type 2Hypothyroidism  ? Renal/GU ?negative Renal ROS  ? ?  ?Musculoskeletal ? ? Abdominal ?  ?Peds ? Hematology ? ?(+) Blood dyscrasia, anemia ,   ?Anesthesia Other Findings ?Reviewed and agree with Bayard Males pre-anesthesia clinical review note.  ? ?Cardiology note 11/10/21:  ?66 y.o. female with has a history of atrial fibrillation, GI bleed (while on aspirin and warfarin), hypertension, hyperlipidemia, CAD s/p CABG 2005 (SVG to diagonal that backfills the LAD), cor triatriatum AS repair 2005, ICM (EF 25-30%) who presents for follow-up. ? ?# Cor triatriatum ASD repair 2005 ?Closed in 2005 at [facility]. No apparent shunting on recent echo or see MRI. 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 ? ?#CAD s/p CABG 2005 ?Heart catheterization completed in 2018 showed a occluded mid LAD with a patent SVG graft  to a diagonal provided flow to the LAD. The RCA and left circumflex were relatively free of disease otherwise with no obvious other grafts present. In  reviewing the course of events with the patient, it sounds that this was done emergently following closure of her ASD in 2005 possibly as a result of her procedure. See MRI completed in November 2021 showed scar in the left circumflex distribution. ?- Aspirin 81 mg indefinitely ?- Continue lovastatin 10 mg ?- Coninue coreg 3.125 mg BID ?- Risk factor modification-lipid panel 08/19/2021-LDL 85 ? ?#Heart failure with reduced ejection fraction ?EF estimated at 42% by cardiac MRI in 2021. NYHA class 1. ?-Continue Coreg 3.125 mg twice daily ?-Continue Entresto 24-26 ?-Continue spironolactone 25 ?-Continue Lasix 40 mg daily ?-Check BMP today; renal function has not been checked since initiation of Entresto ? ?#Chronic atrial fibrillation ?Remains well rate controlled. ?-Continue Coreg 3.125 mg twice daily ?-Continue warfarin ? ?#Hypertension ?Blood pressure is well controlled on maximally tolerated GDMT for heart failure. Continue medications as above. ? ?#Type 2 diabetes ?-Not on insulin therapy. Managed by primary care provider. ? ?Return in about 6 months (around 05/11/2022). ? ? Reproductive/Obstetrics ? ?  ? ? ? ? ? ? ? ? ? ? ? ? ? ?  ?  ? ? ? ? ? ? ? ?Anesthesia Physical ?Anesthesia Plan ? ?ASA: 4 ? ?Anesthesia Plan: General  ? ?Post-op Pain Management:   ? ?Induction: Intravenous ? ?PONV Risk Score and Plan: 3 and Ondansetron, Dexamethasone and Treatment may vary due to age or medical condition ? ?Airway Management Planned: Oral ETT ? ?Additional Equipment:  ? ?Intra-op Plan:  ? ?Post-operative Plan: Extubation in OR ? ?Informed Consent: I have reviewed the patients History and Physical, chart, labs and discussed the procedure including the risks, benefits and alternatives for the proposed anesthesia with the patient or authorized representative who has indicated his/her understanding and acceptance.  ? ? ? ?Dental advisory given ? ?Plan Discussed with: CRNA ? ?Anesthesia Plan Comments: (Patient consented for risks  of anesthesia including but not limited to:  ?- adverse reactions to medications ?- damage to eyes, teeth, lips or other oral mucosa ?- nerve damage due to positioning  ?- sore throat or hoarseness ?- damage to heart, brain, nerves, lungs, other parts of body or loss of life ? ?Informed patient about role of CRNA in peri- and intra-operative care.  Patient voiced understanding.)  ? ? ? ? ? ? ?Anesthesia Quick Evaluation ? ?

## 2022-04-05 NOTE — Anesthesia Postprocedure Evaluation (Signed)
Anesthesia Post Note ? ?Patient: Tammy Arias ? ?Procedure(s) Performed: TOTAL KNEE ARTHROPLASTY (Right: Knee) ? ?Patient location during evaluation: PACU ?Anesthesia Type: General ?Level of consciousness: awake and alert, oriented and patient cooperative ?Pain management: pain level controlled ?Vital Signs Assessment: post-procedure vital signs reviewed and stable ?Respiratory status: spontaneous breathing, nonlabored ventilation and respiratory function stable ?Cardiovascular status: blood pressure returned to baseline and stable ?Postop Assessment: adequate PO intake ?Anesthetic complications: no ? ? ?No notable events documented. ? ? ?Last Vitals:  ?Vitals:  ? 04/05/22 1100 04/05/22 1148  ?BP:  118/71  ?Pulse: 74 84  ?Resp: 10 16  ?Temp:  36.5 ?C  ?SpO2: 98% 96%  ?  ?Last Pain:  ?Vitals:  ? 04/05/22 1100  ?TempSrc:   ?PainSc: 2   ? ? ?  ?  ?  ?  ?  ?  ? ?Darrin Nipper ? ? ? ? ?

## 2022-04-05 NOTE — H&P (Signed)
? ?Chief Complaint  ?Patient presents with  ? Pre-op Exam  ?Scheduled for right TKA 04/05/22 with Dr. Rudene Christians  ? ? ?History of the Present Illness: ?Tammy Arias is a 66 y.o. female here today for history and physical for right total knee arthroplasty with Dr. Hessie Knows on 04/05/2022. Patient has had years of progressive right knee pain that has failed with conservative treatment. Pain is severe and interferes with her quality of life and activities daily living. X-rays and CT scan show advanced arthritis of the right knee with valgus deformity and severe degenerative changes with complete loss of joint space in the lateral compartment. Pain has been increasing over the last several years. Patient states her knee will frequently buckle, give way. At times her knee will lock up. She she has resorted to using a cane for ambulation. ? ?The patient has had bypass surgery. She has never had blood clots in the past. She has been taking warfarin for atrial fibrillation. Her diabetes is managed by Dr Kary Kos, and her last A1c was 6.2. ? ?The patient is retired. 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 [place] [facility]. ? ?I have reviewed past medical, surgical, social and family history, and allergies as documented in the EMR. ? ?Past Medical History: ?Past Medical History:  ?Diagnosis Date  ? Anemia  ? Arthritis  ? Atrial fibrillation (CMS-HCC)  ? Cardiomyopathy, secondary (CMS-HCC)  ?ef 25-30  ? Cataract cortical, senile  ? Congestive heart disease (CMS-HCC)  ? Coronary artery disease  ? Heart murmur, unspecified  ? History of shingles  ? Hyperlipidemia  ? Hypertension  ? Hypothyroidism  ? IBS (irritable bowel syndrome)  ? Migraines  ? Osteoporosis, post-menopausal  ? Stroke (CMS-HCC)  ? Type 2 diabetes mellitus (CMS-HCC)  ? ?Past Surgical History: ?Past Surgical History:  ?Procedure Laterality Date  ? CORONARY ARTERY BYPASS GRAFT 2005  ?Coronary artery bypass grafting, x 3, in 2005.  ? HERNIA  REPAIR 04/21/2017  ?[facility] ---- Dr Rochel Brome  ? CARDIAC SURGERY  ?Congenital heart surgery -correction of cor triatriatum and ASD closure.  ? Congenital heart surgery  ?Correction of cor triatriatum and ASD closure  ? TUBAL LIGATION 1980s  ? ?Past Family History: ?Family History  ?Problem Relation Age of Onset  ? Stroke Mother  ? High blood pressure (Hypertension) Mother  ? Myocardial Infarction (Heart attack) Father  ? Stroke Father  ? Diabetes type II Father  ? Dementia Sister 84  ? Lung cancer Sister 39  ?small cell  ? ?Medications: ?Current Outpatient Medications Ordered in Epic  ?Medication Sig Dispense Refill  ? albuterol 90 mcg/actuation inhaler Inhale 2 inhalations into the lungs every 6 (six) hours as needed for Wheezing 8.5 g 2  ? aspirin 81 MG EC tablet Take 81 mg by mouth once daily.  ? betamethasone dipropionate (DIPROSONE) 0.05 % cream Apply topically 2 (two) times daily 45 g 1  ? blood glucose meter kit 2 (two) times daily 1 each 1  ? blood sugar diagnostic, disc (BLOOD GLUCOSE DIAGNOSTIC, DISC) test strip Use 2 (two) times daily Use as instructed. 100 each 12  ? carvediloL (COREG) 3.125 MG tablet TAKE 1 TABLET BY MOUTH TWICE DAILY WITH MEALS 180 tablet 3  ? codeine-guaifenesin 10-100 mg/5 mL oral liquid Take 10 mLs by mouth every 4 (four) hours as needed for Cough 240 mL 0  ? cyanocobalamin (VITAMIN B12) 1000 MCG tablet Take 1,000 mcg by  mouth once daily.  ? diclofenac (VOLTAREN) 1 % topical gel Apply 2 g topically 4 (four) times daily 400 g 11  ? fluticasone propionate (FLONASE) 50 mcg/actuation nasal spray USE 1 SPRAY INTO EACH NOSTRIL TWICE DAILY 16 g 11  ? FUROsemide (LASIX) 40 MG tablet TAKE 1 TABLET BY MOUTH TWICE DAILY 60 tablet 11  ? HYDROcodone-acetaminophen (NORCO) 5-325 mg tablet Take 1 tablet by mouth every 4 (four) hours as needed  ? lancets Use 1 each 2 (two) times daily Use as instructed. 100 each 12  ? latanoprost (XALATAN) 0.005 % ophthalmic solution  ? LATANOPROST OPHTH Apply to  eye.  ? levothyroxine (SYNTHROID) 50 MCG tablet TAKE 1 TABLET BY MOUTH ONCE DAILY ON AN EMPTY STOMACH. WAIT 30 MINUTES BEFORE TAKING OTHER MEDS. 30 tablet 5  ? lovastatin (MEVACOR) 10 MG tablet Take 1 tablet (10 mg total) by mouth daily with dinner 90 tablet 3  ? metFORMIN (GLUCOPHAGE) 1000 MG tablet TAKE 1 TABLET BY MOUTH TWICE DAILY WITH MEALS 180 tablet 3  ? pantoprazole (PROTONIX) 40 MG DR tablet TAKE 1 TABLET BY MOUTH ONCE DAILY 90 tablet 3  ? sacubitriL-valsartan (ENTRESTO) 24-26 mg tablet Take 1 tablet by mouth every 12 (twelve) hours 60 tablet 11  ? spironolactone (ALDACTONE) 25 MG tablet Take 1 tablet (25 mg total) by mouth once daily 30 tablet 11  ? warfarin (COUMADIN) 3 MG tablet Take 1 tablet (3 mg total) by mouth once daily 180 tablet 3  ? ?No current Epic-ordered facility-administered medications on file.  ? ?Allergies: ?Allergies  ?Allergen Reactions  ? Vioxx [Rofecoxib] Swelling  ? ? ?Body mass index is 27.33 kg/m?. ? ?Review of Systems: ?A comprehensive 14 point ROS was performed, reviewed, and the pertinent orthopaedic findings are documented in the HPI. ? ?Vitals:  ?03/28/22 1428  ?BP: 118/76  ? ? ?General Physical Examination:  ?General:  ?Well developed, well nourished, no apparent distress, normal affect, antalgic gait with a cane ? ?HEENT: ?Head normocephalic, atraumatic, PERRL.  ? ?Abdomen: ?Soft, non tender, non distended, Bowel sounds present. ? ?Heart: ?Examination of the heart reveals normal rate. There is no murmur noted on ascultation. There is a normal apical pulse. ? ?Lungs: ?Lungs are clear to auscultation. There is no wheeze, rhonchi, or crackles. There is normal expansion of bilateral chest walls.  ?Musculoskeletal Examination: ? ?Right knee: ?On exam, right knee range of motion 3-105. Valgus deformity noted. Pain with passive correction. Right hip has 30 degrees internal rotation and 30 degrees external. Good motion. No swelling or edema throughout the lower legs. She is able to  actively straight leg raise. ? ?Radiographs: ?EXAM:  ?CT OF THE RIGHT KNEE WITHOUT CONTRAST  ? ?TECHNIQUE:  ?Multidetector CT imaging of the right knee was performed according  ?to the standard protocol. Multiplanar CT image reconstructions were  ?also generated. Axial imaging only of the right hip and ankle was  ?also performed.  ? ?RADIATION DOSE REDUCTION: This exam was performed according to the  ?departmental dose-optimization program which includes automated  ?exposure control, adjustment of the mA and/or kV according to  ?patient size and/or use of iterative reconstruction technique.  ? ?COMPARISON:  Plain films right knee 12/19/2021.  ? ?FINDINGS:  ?Bones/Joint/Cartilage  ? ?Imaging of the right hip and ankle demonstrates no acute  ?abnormality. Mild appearing right hip osteoarthritis is seen.  ?Moderate appearing midfoot osteoarthritis is also identified.  ? ?Imaging of the knee demonstrates no acute abnormality. The patient  ?has advanced tricompartmental osteoarthritis with  joint space  ?narrowing and osteophytosis present. Chondrocalcinosis is noted. No  ?worrisome bony lesion. Small joint effusion is noted.  ? ?Ligaments  ? ?Suboptimally assessed by CT.  ? ?Muscles and Tendons  ? ?Appear intact on CT.  ? ?Soft tissues  ? ?Negative.  ? ?IMPRESSION:  ?Advanced tricompartmental osteoarthritis right knee.  ? ?Mild right hip and mild to moderate right midfoot osteoarthritis.  ? ?Assessment: ?ICD-10-CM  ?1. Primary osteoarthritis of right knee M17.11  ? ?Plan: ?24. 66 year old female with advanced right knee osteoarthritis with valgus deformity complete loss of joint space and lateral compartment. She is failed conservative treatment and pain is interfering with quality of life and activities daily living. Risks, benefits, complications of a right total knee arthroplasty have been discussed with the patient. Patient has agreed and consented procedure with Dr. Hessie Knows on 04/05/2022. ? ?Surgical Risks: ? ?The  nature of the condition and the proposed procedure has been reviewed in detail with the patient. Surgical versus non-surgical options and prognosis for recovery have been reviewed and the inherent risks

## 2022-04-06 ENCOUNTER — Encounter: Payer: Self-pay | Admitting: Orthopedic Surgery

## 2022-04-06 LAB — BASIC METABOLIC PANEL
Anion gap: 7 (ref 5–15)
BUN: 18 mg/dL (ref 8–23)
CO2: 29 mmol/L (ref 22–32)
Calcium: 8.6 mg/dL — ABNORMAL LOW (ref 8.9–10.3)
Chloride: 104 mmol/L (ref 98–111)
Creatinine, Ser: 1.1 mg/dL — ABNORMAL HIGH (ref 0.44–1.00)
GFR, Estimated: 55 mL/min — ABNORMAL LOW (ref >60)
Glucose, Bld: 126 mg/dL — ABNORMAL HIGH (ref 70–99)
Potassium: 4.4 mmol/L (ref 3.5–5.1)
Sodium: 140 mmol/L (ref 135–145)

## 2022-04-06 LAB — CBC
HCT: 32.5 % — ABNORMAL LOW (ref 36.0–46.0)
Hemoglobin: 10.7 g/dL — ABNORMAL LOW (ref 12.0–15.0)
MCH: 32.9 pg (ref 26.0–34.0)
MCHC: 32.9 g/dL (ref 30.0–36.0)
MCV: 100 fL (ref 80.0–100.0)
Platelets: 100 10*3/uL — ABNORMAL LOW (ref 150–400)
RBC: 3.25 MIL/uL — ABNORMAL LOW (ref 3.87–5.11)
RDW: 12.8 % (ref 11.5–15.5)
WBC: 5.6 10*3/uL (ref 4.0–10.5)
nRBC: 0 % (ref 0.0–0.2)

## 2022-04-06 LAB — GLUCOSE, CAPILLARY
Glucose-Capillary: 109 mg/dL — ABNORMAL HIGH (ref 70–99)
Glucose-Capillary: 152 mg/dL — ABNORMAL HIGH (ref 70–99)
Glucose-Capillary: 128 mg/dL — ABNORMAL HIGH (ref 70–99)
Glucose-Capillary: 131 mg/dL — ABNORMAL HIGH (ref 70–99)

## 2022-04-06 LAB — BPAM RBC
Blood Product Expiration Date: 202305242359
Blood Product Expiration Date: 202306012359
Unit Type and Rh: 600
Unit Type and Rh: 600

## 2022-04-06 LAB — TYPE AND SCREEN
ABO/RH(D): A NEG
Antibody Screen: POSITIVE
Unit division: 0
Unit division: 0

## 2022-04-06 MED ORDER — SACUBITRIL-VALSARTAN 24-26 MG PO TABS
1.0000 | ORAL_TABLET | Freq: Two times a day (BID) | ORAL | Status: DC
  Administered 2022-04-07: 1 via ORAL
  Filled 2022-04-06 (×2): qty 1

## 2022-04-06 MED ORDER — WARFARIN - PHYSICIAN DOSING INPATIENT: Freq: Every day | Status: DC

## 2022-04-06 NOTE — Progress Notes (Signed)
? ?Subjective: ?1 Day Post-Op Procedure(s) (LRB): ?TOTAL KNEE ARTHROPLASTY (Right) ?Patient reports pain as mild.   ?Patient is well, and has had no acute complaints or problems ?Denies any CP, SOB, ABD pain. ?We will continue therapy today.  ?Plan is to go Home after hospital stay. ? ?Objective: ?Vital signs in last 24 hours: ?Temp:  [97.6 ?F (36.4 ?C)-98.4 ?F (36.9 ?C)] 98.3 ?F (36.8 ?C) (05/10 0406) ?Pulse Rate:  [69-84] 75 (05/10 0406) ?Resp:  [9-18] 16 (05/10 0406) ?BP: (91-118)/(56-76) 95/62 (05/10 0406) ?SpO2:  [91 %-100 %] 97 % (05/10 0406) ?Weight:  [67.6 kg] 67.6 kg (05/09 1218) ? ?Intake/Output from previous day: ?05/09 0701 - 05/10 0700 ?In: 1807.5 [I.V.:1607.5; IV Piggyback:200] ?Out: 50 [Blood:50] ?Intake/Output this shift: ?No intake/output data recorded. ? ?Recent Labs  ?  04/05/22 ?1216 04/06/22 ?0424  ?HGB 11.2* 10.7*  ? ?Recent Labs  ?  04/05/22 ?1216 04/06/22 ?0424  ?WBC 3.8* 5.6  ?RBC 3.50* 3.25*  ?HCT 34.3* 32.5*  ?PLT 92* 100*  ? ?Recent Labs  ?  04/05/22 ?1216 04/06/22 ?0424  ?NA  --  140  ?K  --  4.4  ?CL  --  104  ?CO2  --  29  ?BUN  --  18  ?CREATININE 1.18* 1.10*  ?GLUCOSE  --  126*  ?CALCIUM  --  8.6*  ? ?Recent Labs  ?  04/05/22 ?0640  ?INR 1.1  ? ? ?EXAM ?General - Patient is Alert, Appropriate, and Oriented ?Extremity - Neurovascular intact ?Sensation intact distally ?Intact pulses distally ?Dorsiflexion/Plantar flexion intact ?Dressing - dressing C/D/I and no drainage, Prevena intact without drainage ?Motor Function - intact, moving foot and toes well on exam.  ? ?Past Medical History:  ?Diagnosis Date  ? Anemia   ? Arthritis   ? Atrial fibrillation ([place])   ? a.) CHA2DS2-VASc = 7 (age, sex, HFrEF, HTN, TIA x 2, T2DM). b.) rate/rhythm controlled on oral carvedilol; chronically anticoagulated with warfarin.  ? Cardiac murmur   ? Chronic anticoagulation   ? a.) warfarin  ? Cor triatriatum   ? a.) s/p repair 01/2004  ? Coronary artery disease   ? a.) 3v CABG 01/28/2004. b.) R/LHC  03/29/2017: small RCA with occluded SVG; no significant Dz. Chronically occluded LAD with patent SVG to D1/LAD. Insignificant Dz in LCx. LM normal.  ? Cortical cataract   ? DOE (dyspnea on exertion)   ? DOE (dyspnea on exertion)   ? GERD (gastroesophageal reflux disease)   ? HFrEF (heart failure with reduced ejection fraction) ([place])   ? a.) TTE 10/27/2014: EF 40%; glob HK; mild BAE; triv AR, mild MR/PR, mod TR.  b.) TTE 03/15/2017: EF 30%; glob HK; mod BAE; mod pHTN (RVSP 54.8 mmHg); mild PR, mod MR; sev TR.  c.) R/LHC 03/29/2017: EF 30-35%. d.)TTE 10/25/2018: EF 35%; mod BAE; mod BVE; glob HK; triv PR, mod MR, sev TR; RVSP 57.7 mmHg; G2DD.  ? History of shingles 2004  ? Hyperlipidemia   ? Hypertension   ? Hypothyroidism   ? IBS (irritable bowel syndrome)   ? Ischemic cardiomyopathy   ? a.) TTE 10/27/2014: EF 40%. b.) TTE 03/15/2017: EF 30%. c.) R/LHC 04/01/2017: EF 30-35%. d.) TTE 10/25/2018: EF 35%  ? Lumbar scoliosis   ? Lumbar spinal stenosis   ? Migraines   ? Osteoporosis   ? Pulmonary HTN ([place])   ? a.) TTE 03/15/2017: EF 30-35%; RVSP 54.8 mmHg. b.) R/LHC 03/29/2017: mean PA 33 mmHg, PCWP 22 mmHg, LVEDP 14 mmHg, mean  AO 79 mmHg; CO 7.89 L/min, CI 4.61 L/min/m?  ? 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 T2DM (type 2 diabetes mellitus) ([place])   ? TIA (transient ischemic attack) 05/29/2016  ? Vitamin B 12 deficiency   ? ? ?Assessment/Plan:   ?1 Day Post-Op Procedure(s) (LRB): ?TOTAL KNEE ARTHROPLASTY (Right) ?Principal Problem: ?  S/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, right ?Active Problems: ?  S/P TKR (total knee replacement) ? ?Estimated body mass index is 28.15 kg/m? as calculated from the following: ?  Height as of this encounter: '5\' 1"'$  (1.549 m). ?  Weight as of this encounter: 67.6 kg. ?Advance diet ?Up with therapy ?Pain well controlled ? ?Vital signs stable, blood pressure soft.  Will hold Entresto.  Continue to monitor vitals.  Patient currently asymptomatic ? ?Care management to assist with discharge to home  with home health PT.  Anticipate discharge to home tomorrow. ? ?INR 1.1, continue bridging with Lovenox.  Recheck INR tomorrow ? ?DVT Prophylaxis - Lovenox, Coumadin, and TED hose, SCDs ?Weight-Bearing as tolerated to right leg ? ? ?T. Rachelle Hora, PA-C ?[place] ?04/06/2022, 7:51 AM ?  ?

## 2022-04-06 NOTE — Evaluation (Signed)
Occupational Therapy Evaluation ?Patient Details ?Name: Tammy Arias ?MRN: [id number] ?DOB: 10-19-1956 ?Today's Date: 04/06/2022 ? ? ?History of Present Illness Patient is a 66 year old female s/p R total knee arthroplasty  ? ?Clinical Impression ?  ?Pt seen for OT evaluation this date, POD#1 from above surgery. Pt was independent in all ADLs prior to surgery. Pt is eager to return to PLOF with less pain and improved safety and independence. Pt instructed in polar care mgt, falls prevention strategies, home/routines modifications, DME/AE for LB bathing and dressing tasks, and compression stocking mgt. SET UP required for donning socks, MIN A for LB dressing following toileting. Supervision required for ADL ambulation with RW, CGA-supervision required for STS. Pt would benefit from skilled OT services including additional instruction in dressing techniques with or without assistive devices for dressing and bathing skills to support recall and carryover prior to discharge and ultimately to maximize safety, independence, and minimize falls risk and caregiver burden. Do not currently anticipate any OT needs following this hospitalization.  Pt is left  in bedside chair, NAD, all needs met. OT will follow acutely.   ? ?Recommendations for follow up therapy are one component of a multi-disciplinary discharge planning process, led by the attending physician.  Recommendations may be updated based on patient status, additional functional criteria and insurance authorization.  ? ?Follow Up Recommendations ? No OT follow up  ?  ?Assistance Recommended at Discharge Intermittent Supervision/Assistance  ?Patient can return home with the following A little help with bathing/dressing/bathroom;Help with stairs or ramp for entrance ? ?  ?Functional Status Assessment ? Patient has had a recent decline in their functional status and demonstrates the ability to make significant improvements in function in a reasonable and predictable  amount of time.  ?Equipment Recommendations ? Other (comment);Tub/shower bench (pt will purchase [facility] for [facility] if neededm tub transfer bench after cleared to shower if needed)  ?  ?Recommendations for Other Services   ? ? ?  ?Precautions / Restrictions Precautions ?Precautions: Fall;Knee ?Restrictions ?Weight Bearing Restrictions: Yes ?RLE Weight Bearing: Weight bearing as tolerated  ? ?  ? ?Mobility Bed Mobility ?  ?  ?  ?  ?  ?  ?  ?General bed mobility comments: NT pt on toilet prior to eval, in recliner post eval ?  ? ?Transfers ?Overall transfer level: Needs assistance ?Equipment used: Rolling walker (2 wheels) ?Transfers: Sit to/from Stand ?Sit to Stand: Min guard ?  ?  ?  ?  ?  ?  ?  ? ?  ?Balance Overall balance assessment: Needs assistance ?Sitting-balance support: Bilateral upper extremity supported, Feet supported ?Sitting balance-Leahy Scale: Good ?  ?  ?Standing balance support: Bilateral upper extremity supported, During functional activity ?Standing balance-Leahy Scale: Fair ?  ?  ?  ?  ?  ?  ?  ?  ?  ?  ?  ?  ?   ? ?ADL either performed or assessed with clinical judgement  ? ?ADL Overall ADL's : Needs assistance/impaired ?Eating/Feeding: Sitting;Set up ?  ?Grooming: Wash/dry hands;Standing ?Grooming Details (indicate cue type and reason): at sink with RW ?  ?  ?  ?  ?  ?  ?Lower Body Dressing: Set up;Sit to/from stand;Minimal assistance ?Lower Body Dressing Details (indicate cue type and reason): MIN A for underwear, SET UP for socks ?Toilet Transfer: Min guard;Grab bars;Ambulation;Rolling walker (2 wheels) ?  ?Toileting- Water quality scientist and Hygiene: Min guard;Sit to/from stand ?  ?  ?  ?Functional mobility during  ADLs: Supervision/safety;Rolling walker (2 wheels) ?   ? ? ? ?Vision Patient Visual Report: No change from baseline ?   ?   ?Perception   ?  ?Praxis   ?  ? ?Pertinent Vitals/Pain Pain Assessment ?Pain Assessment: 0-10 ?Pain Score: 5  ?Pain Location: R knee ?Pain Descriptors /  Indicators: Aching, Discomfort ?Pain Intervention(s): Limited activity within patient's tolerance, Monitored during session, RN gave pain meds during session, Repositioned  ? ? ? ?Hand Dominance   ?  ?Extremity/Trunk Assessment Upper Extremity Assessment ?Upper Extremity Assessment: Overall WFL for tasks assessed ?  ?Lower Extremity Assessment ?Lower Extremity Assessment: RLE deficits/detail ?RLE Deficits / Details: s/p R TKA ?  ?  ?  ?Communication Communication ?Communication: HOH ?  ?Cognition Arousal/Alertness: Awake/alert ?Behavior During Therapy: [facility] for tasks assessed/performed ?Overall Cognitive Status: Within Functional Limits for tasks assessed ?  ?  ?  ?  ?  ?  ?  ?  ?  ?  ?  ?  ?  ?  ?  ?  ?  ?  ?  ?General Comments  vss, no c/o dizziness throughout ? ?  ?Exercises   ?  ?Shoulder Instructions    ? ? ?Home Living Family/patient expects to be discharged to:: Private residence ?Living Arrangements: Alone ?Available Help at Discharge: Friend(s) ?Type of Home: Apartment ?Home Access: Level entry ?  ?  ?Home Layout: One level ?  ?  ?Bathroom Shower/Tub: Tub/shower unit ?  ?Bathroom Toilet: Standard ?  ?  ?Home Equipment: Cane - quad;Other (comment);Grab bars - tub/shower (rails around toilet with raised toilet seat) ?  ?  ?  ? ?  ?Prior Functioning/Environment Prior Level of Function : Independent/Modified Independent ?  ?  ?  ?  ?  ?  ?Mobility Comments: amb with SPC at baseline ?ADLs Comments: pt reports MOD I-I in all ADL/IADL ?  ? ?  ?  ?OT Problem List: Decreased activity tolerance ?  ?   ?OT Treatment/Interventions: Self-care/ADL training;Patient/family education;DME and/or AE instruction  ?  ?OT Goals(Current goals can be found in the care plan section) Acute Rehab OT Goals ?Patient Stated Goal: improve pain ?OT Goal Formulation: With patient ?Time For Goal Achievement: 04/20/22 ?Potential to Achieve Goals: Good ?ADL Goals ?Pt Will Perform Grooming: with modified independence;standing ?Pt Will Perform  Lower Body Dressing: with modified independence;sit to/from stand;with adaptive equipment ?Pt Will Transfer to Toilet: with modified independence ?Pt Will Perform Toileting - Clothing Manipulation and hygiene: with modified independence  ?OT Frequency: Min 2X/week ?  ? ?Co-evaluation   ?  ?  ?  ?  ? ?  ?AM-PAC OT "6 Clicks" Daily Activity     ?Outcome Measure Help from another person eating meals?: None ?Help from another person taking care of personal grooming?: None ?Help from another person toileting, which includes using toliet, bedpan, or urinal?: A Little ?Help from another person bathing (including washing, rinsing, drying)?: A Little ?Help from another person to put on and taking off regular upper body clothing?: None ?Help from another person to put on and taking off regular lower body clothing?: None ?6 Click Score: 22 ?  ?End of Session Equipment Utilized During Treatment: Rolling walker (2 wheels) ?Nurse Communication: Mobility status ? ?Activity Tolerance: Patient tolerated treatment well ?Patient left: in chair;with call bell/phone within reach;with chair alarm set ? ?OT Visit Diagnosis: Unsteadiness on feet (R26.81)  ?              ?Time: 1610-9604 ?OT Time Calculation (  min): 17 min ?Charges:  OT General Charges ?$OT Visit: 1 Visit ?OT Evaluation ?$OT Eval Low Complexity: 1 Low ? ?Shanon Payor, OTD OTR/L  ?04/06/22, 9:53 AM  ?

## 2022-04-06 NOTE — Progress Notes (Signed)
Pt to bathroom with walker kept WBAT, 1 assist; no complaints or concerns. Assisted back to bed, no signs of distress. Bed is low, locked and alarmed. Call light and essentials are within reach.  ?

## 2022-04-06 NOTE — Progress Notes (Signed)
Physical Therapy Treatment ?Patient Details ?Name: Tammy Arias ?MRN: [id number] ?DOB: 02-Apr-1956 ?Today's Date: 04/06/2022 ? ? ?History of Present Illness Patient is a 66 year old female s/p R total knee arthroplasty ? ?  ?PT Comments  ? ? PM physical therapy session completed this date. Patient tolerated session well despite reporting being "very tired". Upon entry into room patient was seated in recliner with only mild pain reported, and requesting to go to the bathroom.  Patient was able to perform sit to stands from recliner and commode as well as ambulate to/from commode at SUP with RW. Increased ease noted with sit to supine bed mobility, completed at [facility] and patient was able to independently reposition herself in bed. HEP packet reviewed with patient, and 10 reps were performed of each exercise bilaterally. Patient would continue to benefit form skilled physical therapy in order to optimize patient's return to PLOF. Continue to recommend HHPT upon discharge from acute hospitalization.   ?  ?Recommendations for follow up therapy are one component of a multi-disciplinary discharge planning process, led by the attending physician.  Recommendations may be updated based on patient status, additional functional criteria and insurance authorization. ? ?Follow Up Recommendations ? Home health PT ?  ?  ?Assistance Recommended at Discharge Intermittent Supervision/Assistance  ?Patient can return home with the following A little help with walking and/or transfers;Help with stairs or ramp for entrance;A little help with bathing/dressing/bathroom ?  ?Equipment Recommendations ? Rolling walker (2 wheels)  ?  ?Recommendations for Other Services   ? ? ?  ?Precautions / Restrictions Precautions ?Precautions: Fall;Knee ?Restrictions ?Weight Bearing Restrictions: Yes ?RLE Weight Bearing: Weight bearing as tolerated  ?  ? ?Mobility ? Bed Mobility ?Overal bed mobility: Needs Assistance ?Bed Mobility: Sit to Supine ?  ?  ?Supine  to sit: Supervision, HOB elevated ?Sit to supine: Supervision, HOB elevated ?  ?General bed mobility comments: Patient ended session on commode with RN present ?Patient Response: Cooperative ? ?Transfers ?Overall transfer level: Needs assistance ?Equipment used: Rolling walker (2 wheels) ?Transfers: Sit to/from Stand ?Sit to Stand: Supervision (from recliner and commode) ?  ?  ?  ?  ?  ?General transfer comment: good recall on hand placement, increased use of toliet grab bar to come to standing ?  ? ?Ambulation/Gait ?Ambulation/Gait assistance: Supervision ?Gait Distance (Feet): 20 Feet (recliner>commode>EOB) ?Assistive device: Rolling walker (2 wheels) ?Gait Pattern/deviations: Step-through pattern, Decreased step length - right, Decreased step length - left, Decreased stance time - right, Narrow base of support, Antalgic ?Gait velocity: decreased ?  ?  ? ? ? ?Stairs ? ? ?Wheelchair Mobility ?  ? ?Modified Rankin (Stroke Patients Only) ?  ? ? ?  ?Balance Overall balance assessment: Needs assistance ?Sitting-balance support: Bilateral upper extremity supported, Feet supported ?Sitting balance-Leahy Scale: Good ?Sitting balance - Comments: can each outside BOS ?  ?Standing balance support: Bilateral upper extremity supported, During functional activity ?Standing balance-Leahy Scale: Fair ?  ?  ?  ?  ?  ?  ?  ?  ?  ?  ?  ?  ?  ? ?  ?Cognition Arousal/Alertness: Awake/alert ?Behavior During Therapy: [facility] for tasks assessed/performed ?Overall Cognitive Status: Within Functional Limits for tasks assessed ?  ?  ?  ?  ?  ?  ?  ?  ?  ?  ?  ?  ?  ?  ?  ?  ?  ?  ?  ? ?  ?Exercises Total Joint Exercises ?Ankle Circles/Pumps: Supine,  20 reps, AROM, Both ?Quad Sets: Supine, 10 reps, AROM, Both ?Short Arc Quad: Supine, 10 reps, AROM, Both ?Heel Slides: Supine, 10 reps, AROM, Both ?Hip ABduction/ADduction: Supine, 10 reps, AROM, Both ?Straight Leg Raises: Supine, 10 reps, AROM, Both ? ?  ?General Comments   ?  ?  ? ?Pertinent  Vitals/Pain Pain Assessment ?Pain Score: 2  ?Pain Location: R knee ?Pain Descriptors / Indicators: Aching, Discomfort ?Pain Intervention(s): Monitored during session  ? ? ?Home Living   ?  ?  ?  ?  ?  ?  ?  ?  ?  ?   ?  ?Prior Function    ?  ?  ?   ? ?PT Goals (current goals can now be found in the care plan section) Acute Rehab PT Goals ?Patient Stated Goal: to go home ?PT Goal Formulation: With patient ?Time For Goal Achievement: 04/19/22 ?Potential to Achieve Goals: Good ?Progress towards PT goals: Progressing toward goals ? ?  ?Frequency ? ? ? BID ? ? ? ?  ?PT Plan Current plan remains appropriate  ? ? ?Co-evaluation   ?  ?  ?  ?  ? ?  ?AM-PAC PT "6 Clicks" Mobility   ?Outcome Measure ? Help needed turning from your back to your side while in a flat bed without using bedrails?: None ?Help needed moving from lying on your back to sitting on the side of a flat bed without using bedrails?: None ?Help needed moving to and from a bed to a chair (including a wheelchair)?: A Little ?Help needed standing up from a chair using your arms (e.g., wheelchair or bedside chair)?: A Little ?Help needed to walk in hospital room?: A Little ?Help needed climbing 3-5 steps with a railing? : A Little ?6 Click Score: 20 ? ?  ?End of Session Equipment Utilized During Treatment: Gait belt ?Activity Tolerance: Patient tolerated treatment well ?Patient left: in bed;with call bell/phone within reach;with bed alarm set ?Nurse Communication: Mobility status ?PT Visit Diagnosis: Unsteadiness on feet (R26.81);Muscle weakness (generalized) (M62.81);Pain ?Pain - Right/Left: Right ?Pain - part of body: Knee ?  ? ? ?Time: 1336-1400 ?PT Time Calculation (min) (ACUTE ONLY): 24 min ? ?Charges:  $Therapeutic Exercise: 23-37 mins ?         ?          ?Iva Boop, PT  ?04/06/22. 2:15 PM ? ? ?

## 2022-04-06 NOTE — Plan of Care (Signed)

## 2022-04-06 NOTE — Progress Notes (Signed)
Physical Therapy Treatment ?Patient Details ?Name: Tammy Arias ?MRN: [id number] ?DOB: 06-01-1956 ?Today's Date: 04/06/2022 ? ? ?History of Present Illness Patient is a 66 year old female s/p R total knee arthroplasty ? ?  ?PT Comments  ? ? Physical therapy session completed this date. Patient tolerated session well and was agreeable to treatment. Upon entry into room patient reported a 4/10 knee pain that did not increase with activity. Patient was able to demonstrated improved ability to perform a SLR, and completed supine to sit transfer at SUP. Once in sitting patient demonstrated good recall on proper hand placement, and was able to ambulate ~255fet with RW at SBA/SUP with no LOB. Following education and cueing on proper stair negotiation, patient ascended/descended 4 steps with a step to pattern and bilateral hand rails at [place] Patient was left on commode with RN present. Patient is progressing nicely towards her goals and would continue to benefit from skilled physical therapy in order to optimize patient's return to PLOF. Continue to recommend HHPT upon discharge from acute hospitalization.  ?  ?Recommendations for follow up therapy are one component of a multi-disciplinary discharge planning process, led by the attending physician.  Recommendations may be updated based on patient status, additional functional criteria and insurance authorization. ? ?Follow Up Recommendations ? Home health PT ?  ?  ?Assistance Recommended at Discharge Intermittent Supervision/Assistance  ?Patient can return home with the following A little help with walking and/or transfers;Help with stairs or ramp for entrance;A little help with bathing/dressing/bathroom ?  ?Equipment Recommendations ? Rolling walker (2 wheels)  ?  ?Recommendations for Other Services   ? ? ?  ?Precautions / Restrictions Precautions ?Precautions: Fall;Knee ?Restrictions ?Weight Bearing Restrictions: Yes ?RLE Weight Bearing: Weight bearing as tolerated  ?   ? ?Mobility ? Bed Mobility ?Overal bed mobility: Needs Assistance ?Bed Mobility: Supine to Sit ?  ?  ?Supine to sit: Supervision, HOB elevated ?  ?  ?General bed mobility comments: Patient ended session on commode with RN present ?Patient Response: Cooperative ? ?Transfers ?Overall transfer level: Needs assistance ?Equipment used: Rolling walker (2 wheels) ?Transfers: Sit to/from Stand ?Sit to Stand: Supervision (SBA) ?  ?  ?  ?  ?  ?General transfer comment: good recall on hand placement ?  ? ?Ambulation/Gait ?Ambulation/Gait assistance: Supervision (SBA) ?Gait Distance (Feet): 250 Feet ?Assistive device: Rolling walker (2 wheels) ?Gait Pattern/deviations: Step-through pattern, Decreased step length - right, Decreased step length - left, Decreased stance time - right, Narrow base of support, Antalgic ?Gait velocity: decreased ?  ?  ?General Gait Details: cueing to maintain a safe distance within the RW, no LOB noted ? ? ?Stairs ?Stairs: Yes ?Stairs assistance: Min guard ?Stair Management: Step to pattern, Two rails ?Number of Stairs: 4 ?General stair comments: cueing on proper stair negotiation ? ? ?Wheelchair Mobility ?  ? ?Modified Rankin (Stroke Patients Only) ?  ? ? ?  ?Balance Overall balance assessment: Needs assistance ?Sitting-balance support: Bilateral upper extremity supported, Feet supported ?Sitting balance-Leahy Scale: Good ?Sitting balance - Comments: can each outside BOS ?  ?Standing balance support: Bilateral upper extremity supported, During functional activity ?Standing balance-Leahy Scale: Fair ?  ?  ?  ?  ?  ?  ?  ?  ?  ?  ?  ?  ?  ? ?  ?Cognition Arousal/Alertness: Awake/alert ?[facility] ?Overall Cognitive Status: Within Functional Limits for tasks assessed ?  ?  ?  ?  ?  ?  ?  ?  ?  ?  ?  ?  ?  ?  ?  ?  ?  ?  ?  ? ?  ?  Exercises   ? ?  ?General Comments General comments (skin integrity, edema, etc.): vss, no c/o dizziness throughout ?  ?   ? ?Pertinent Vitals/Pain Pain Assessment ?Pain Score: 4  ?Pain Location: R knee ?Pain Descriptors / Indicators: Aching, Discomfort ?Pain Intervention(s): Monitored during session, Repositioned  ? ? ?Home Living Family/patient expects to be discharged to:: Private residence ?Living Arrangements: Alone ?Available Help at Discharge: Friend(s) ?Type of Home: Apartment ?Home Access: Level entry ?  ?  ?  ?Home Layout: One level ?Home Equipment: Cane - quad;Other (comment);Grab bars - tub/shower (rails around toilet with raised toilet seat) ?   ?  ?Prior Function    ?  ?  ?   ? ?PT Goals (current goals can now be found in the care plan section) Acute Rehab PT Goals ?Patient Stated Goal: to go home ?PT Goal Formulation: With patient ?Time For Goal Achievement: 04/19/22 ?Potential to Achieve Goals: Good ?Progress towards PT goals: Progressing toward goals ? ?  ?Frequency ? ? ? BID ? ? ? ?  ?PT Plan Current plan remains appropriate  ? ? ?Co-evaluation   ?  ?  ?  ?  ? ?  ?AM-PAC PT "6 Clicks" Mobility   ?Outcome Measure ? Help needed turning from your back to your side while in a flat bed without using bedrails?: None ?Help needed moving from lying on your back to sitting on the side of a flat bed without using bedrails?: None ?Help needed moving to and from a bed to a chair (including a wheelchair)?: A Little ?Help needed standing up from a chair using your arms (e.g., wheelchair or bedside chair)?: A Little ?Help needed to walk in hospital room?: A Little ?Help needed climbing 3-5 steps with a railing? : A Little ?6 Click Score: 20 ? ?  ?End of Session Equipment Utilized During Treatment: Gait belt ?Activity Tolerance: Patient tolerated treatment well ?Patient left: with nursing/sitter in room (left on commode) ?Nurse Communication: Mobility status ?PT Visit Diagnosis: Unsteadiness on feet (R26.81);Muscle weakness (generalized) (M62.81);Pain ?Pain - Right/Left: Right ?Pain - part of body: Knee ?  ? ? ?Time: 6767-2094 ?PT  Time Calculation (min) (ACUTE ONLY): 26 min ? ?Charges:  $Gait Training: 8-22 mins ?$Therapeutic Activity: 8-22 mins          ?          ? ?Iva Boop, PT  ?04/06/22. 10:23 AM ? ? ?

## 2022-04-07 LAB — CBC
HCT: 32.4 % — ABNORMAL LOW (ref 36.0–46.0)
Hemoglobin: 10.6 g/dL — ABNORMAL LOW (ref 12.0–15.0)
MCH: 32.9 pg (ref 26.0–34.0)
MCHC: 32.7 g/dL (ref 30.0–36.0)
MCV: 100.6 fL — ABNORMAL HIGH (ref 80.0–100.0)
Platelets: 105 10*3/uL — ABNORMAL LOW (ref 150–400)
RBC: 3.22 MIL/uL — ABNORMAL LOW (ref 3.87–5.11)
RDW: 13.2 % (ref 11.5–15.5)
WBC: 6.1 10*3/uL (ref 4.0–10.5)
nRBC: 0 % (ref 0.0–0.2)

## 2022-04-07 LAB — GLUCOSE, CAPILLARY
Glucose-Capillary: 138 mg/dL — ABNORMAL HIGH (ref 70–99)
Glucose-Capillary: 156 mg/dL — ABNORMAL HIGH (ref 70–99)

## 2022-04-07 LAB — PROTIME-INR
INR: 1.3 — ABNORMAL HIGH (ref 0.8–1.2)
Prothrombin Time: 16.3 seconds — ABNORMAL HIGH (ref 11.4–15.2)

## 2022-04-07 MED ORDER — HYDROCODONE-ACETAMINOPHEN 7.5-325 MG PO TABS
1.0000 | ORAL_TABLET | ORAL | 0 refills | Status: DC | PRN
Start: 2022-04-07 — End: 2023-06-08

## 2022-04-07 MED ORDER — METHOCARBAMOL 500 MG PO TABS: 500.0000 mg | ORAL_TABLET | Freq: Four times a day (QID) | ORAL | 0 refills | Status: DC | PRN

## 2022-04-07 MED ORDER — POLYETHYLENE GLYCOL 3350 17 G PO PACK: 17.0000 g | PACK | Freq: Every day | ORAL | 0 refills | Status: DC | PRN

## 2022-04-07 MED ORDER — TRAMADOL HCL 50 MG PO TABS: 50.0000 mg | ORAL_TABLET | Freq: Four times a day (QID) | ORAL | 0 refills | Status: DC | PRN

## 2022-04-07 MED ORDER — DOCUSATE SODIUM 100 MG PO CAPS: 100.0000 mg | ORAL_CAPSULE | Freq: Two times a day (BID) | ORAL | 0 refills | Status: DC

## 2022-04-07 MED ORDER — ONDANSETRON HCL 4 MG PO TABS: 4.0000 mg | ORAL_TABLET | Freq: Four times a day (QID) | ORAL | 0 refills | Status: DC | PRN

## 2022-04-07 MED ORDER — ENOXAPARIN SODIUM 40 MG/0.4ML IJ SOSY: 40.0000 mg | PREFILLED_SYRINGE | INTRAMUSCULAR | 0 refills | Status: DC

## 2022-04-07 MED ORDER — ENOXAPARIN SODIUM 100 MG/ML IJ SOSY: 100.0000 mg | PREFILLED_SYRINGE | INTRAMUSCULAR | 0 refills | Status: DC

## 2022-04-07 NOTE — Evaluation (Signed)
Physical Therapy Evaluation ?Patient Details ?Name: Tammy Arias ?MRN: [id number] ?DOB: 04-13-1956 ?Today's Date: 04/07/2022 ? ?History of Present Illness ? Patient is a 66 year old female s/p R total knee arthroplasty  ?Clinical Impression ? Pt presents alert and oriented supine in bed and agreeable to PT. Newly received RW adjusted to pt's height. Pt shows increased mobility and activity tolerance with ability to ambulate 300 ft in one continuous bout of walking. Despite knee ROM deficits, pt shows ability to consistently weight bear through RLE with heel contact. Min cueing for increased step length on LLE to achieve step through pattern. Informed by patient that she will be staying at her friend's house for care and that they do not have HHPT near his/her house. However, pt's friend can reliably drive her to outpatient PT apt. Therefore, changing discharge recommendation to outpatient PT. Pt is safe to discharge home with additional outpatient PT.   ?   ? ?Recommendations for follow up therapy are one component of a multi-disciplinary discharge planning process, led by the attending physician.  Recommendations may be updated based on patient status, additional functional criteria and insurance authorization. ? ?Follow Up Recommendations Outpatient PT (Patient will be staying with friend for care and friend can reliably drive her to appointments) ? ?  ?Assistance Recommended at Discharge Intermittent Supervision/Assistance  ?Patient can return home with the following ?   ? ?  ?Equipment Recommendations Rolling walker (2 wheels)  ?Recommendations for Other Services ?    ?  ?Functional Status Assessment Patient has had a recent decline in their functional status and demonstrates the ability to make significant improvements in function in a reasonable and predictable amount of time.  ? ?  ?Precautions / Restrictions Precautions ?Precautions: Fall;Knee ?Restrictions ?Weight Bearing Restrictions: Yes ?RLE Weight  Bearing: Weight bearing as tolerated  ? ?  ? ?Mobility ? Bed Mobility ?Overal bed mobility: Independent ?Bed Mobility: Sit to Supine ?  ?  ?Supine to sit: Supervision, HOB elevated ?Sit to supine: Supervision ?  ?General bed mobility comments: Patient ended seated in bedside chair with phone and bell call available. ?  ? ?Transfers ?Overall transfer level: Modified independent ?Equipment used: Rolling walker (2 wheels) ?Transfers: Sit to/from Stand ?Sit to Stand: Supervision ?  ?  ?  ?  ?  ?  ?  ? ?Ambulation/Gait ?Ambulation/Gait assistance: Min guard, Modified independent (Device/Increase time) ?Gait Distance (Feet): 300 Feet ?Assistive device: Rolling walker (2 wheels) ?Gait Pattern/deviations: Decreased stance time - right, Decreased step length - left, Step-through pattern ?Gait velocity: decreased ?Gait velocity interpretation: <1.31 ft/sec, indicative of household ambulator ?  ?General Gait Details: Cueing to increase step length on left. ? ?Stairs ?  ?  ?  ?  ?  ? ?Wheelchair Mobility ?  ? ?Modified Rankin (Stroke Patients Only) ?  ? ?  ? ?Balance Overall balance assessment: Modified Independent ?Sitting-balance support: Bilateral upper extremity supported ?Sitting balance-Leahy Scale: Normal ?Sitting balance - Comments: Can weight shift and reach outside BOS ?  ?Standing balance support: Bilateral upper extremity supported, During functional activity ?Standing balance-Leahy Scale: Good ?Standing balance comment: CGA for increase safety and stability ?  ?  ?  ?  ?  ?  ?  ?  ?  ?  ?  ?   ? ? ? ?Pertinent Vitals/Pain Pain Assessment ?Pain Assessment: 0-10 ?Pain Score: 4  ?Pain Location: R knee ?Pain Descriptors / Indicators: Aching, Discomfort ?Pain Intervention(s): Monitored during session, RN gave pain meds during session  ? ? ?  Home Living Family/patient expects to be discharged to:: Private residence ?Living Arrangements: Alone ?Available Help at Discharge: Friend(s) ?Type of Home: Apartment ?Home Access:  Level entry ?  ?  ?  ?Home Layout: One level ?Home Equipment: Cane - quad;Other (comment);Grab bars - tub/shower ?Additional Comments: RW adjusted to pt height and given to pt  ?  ?Prior Function Prior Level of Function : Independent/Modified Independent ?  ?  ?  ?  ?  ?  ?Mobility Comments: amb with SPC at baseline ?ADLs Comments: pt reports MOD I-I in all ADL/IADL ?  ? ? ?Hand Dominance  ?   ? ?  ?Extremity/Trunk Assessment  ? Upper Extremity Assessment ?Upper Extremity Assessment: Overall WFL for tasks assessed ?  ? ?Lower Extremity Assessment ?Lower Extremity Assessment: RLE deficits/detail ?RLE Deficits / Details: s/p R TKA POD2 ?  ? ?Cervical / Trunk Assessment ?Cervical / Trunk Assessment: Normal  ?Communication  ? Communication: HOH  ?Cognition Arousal/Alertness: Awake/alert ?Behavior During Therapy: [facility] for tasks assessed/performed ?Overall Cognitive Status: Within Functional Limits for tasks assessed ?  ?  ?  ?  ?  ?  ?  ?  ?  ?  ?  ?  ?  ?  ?  ?  ?General Comments: A&Ox3 self location and situation ?  ?  ? ?  ?General Comments General comments (skin integrity, edema, etc.): Some nausea and increase pain at start of session, but this did not limit session with pt being able to complete continuous bout of ambulation. ? ?  ?Exercises Total Joint Exercises ?Goniometric ROM: 73 deg Knee Flexion AROM, 75 deg Knee Flexion PROM 10, Knee Ext AROM 10, Knee Ext PROM 8 ?Other Exercises ?Other Exercises: Heel Slides 1 x 10 with overpressure ?Other Exercises: Hip ABD 1 x 10 with overpressure ?Other Exercises: Quad Sets 1 x 10 ?Other Exercises: SLR x 2  ? ?Assessment/Plan  ?  ?PT Assessment Patient needs continued PT services  ?PT Problem List Decreased strength;Decreased range of motion;Decreased mobility;Decreased activity tolerance;Decreased balance;Pain ? ?   ?  ?PT Treatment Interventions DME instruction;Therapeutic activities;Gait training;Therapeutic exercise;Stair training;Balance training   ? ?PT Goals (Current  goals can be found in the Care Plan section)  ?Acute Rehab PT Goals ?Patient Stated Goal: to go home ?PT Goal Formulation: With patient ?Time For Goal Achievement: 04/19/22 ?Potential to Achieve Goals: Good ? ?  ?Frequency BID ?  ? ? ?Co-evaluation   ?  ?  ?  ?  ? ? ?  ?AM-PAC PT "6 Clicks" Mobility  ?Outcome Measure Help needed turning from your back to your side while in a flat bed without using bedrails?: None ?Help needed moving from lying on your back to sitting on the side of a flat bed without using bedrails?: None ?Help needed moving to and from a bed to a chair (including a wheelchair)?: None ?Help needed standing up from a chair using your arms (e.g., wheelchair or bedside chair)?: None ?Help needed to walk in hospital room?: A Little ?Help needed climbing 3-5 steps with a railing? : A Little ?6 Click Score: 22 ? ?  ?End of Session Equipment Utilized During Treatment: Gait belt ?Activity Tolerance: Patient tolerated treatment well ?Patient left: in chair;with call bell/phone within reach;with bed alarm set;with chair alarm set ?Nurse Communication: Mobility status ?PT Visit Diagnosis: Unsteadiness on feet (R26.81);Muscle weakness (generalized) (M62.81);Pain ?Pain - Right/Left: Right ?Pain - part of body: Knee ?  ? ?Time: 7893-8101 ?PT Time Calculation (min) (ACUTE ONLY): 40 min ? ? ?  Charges:     ?PT Treatments ?$Gait Training: 23-37 mins ?$Therapeutic Exercise: 8-22 mins ?  ?   ? ?Daneil Dan PT, DPT  ?04/07/2022, 9:46 AM ? ?

## 2022-04-07 NOTE — TOC Progression Note (Signed)
Transition of Care (TOC) - Progression Note  ? ? ?Patient Details  ?Name: Tammy Arias ?MRN: [id number] ?Date of Birth: 1956/08/05 ? ?Transition of Care (TOC) CM/SW Contact  ?Conception Oms, RN ?Phone Number: ?04/07/2022, 9:42 AM ? ?Clinical Narrative:    ? ?Notified [facility] to add nursing for INR check ? ?Expected Discharge Plan: Uncertain ?Barriers to Discharge: Continued Medical Work up ? ?Expected Discharge Plan and Services ?Expected Discharge Plan: [place] ?  ?Discharge Planning Services: CM Consult ?  ?Living arrangements for the past 2 months: [place] ?                ?DME Arranged: Walker rolling ?DME Agency: [employer] ?Date DME Agency Contacted: 04/05/22 ?Time DME Agency Contacted: [phone] ?Representative spoke with at DME Agency: intake ?HH Arranged: PT ?[place] Agency: [place] ?Date HH Agency Contacted: 04/05/22 ?Time [place]: [phone] ?Representative spoke with at [place]: Judson Roch ? ? ?Social Determinants of Health (SDOH) Interventions ?  ? ?Readmission Risk Interventions ?   ? View : No data to display.  ?  ?  ?  ? ? ?

## 2022-04-07 NOTE — Progress Notes (Addendum)
Occupational Therapy Treatment ?Patient Details ?Name: Tammy Arias ?MRN: [id number] ?DOB: [birth year] ?Today's Date: 04/07/2022 ? ? ?History of present illness Patient is a 66 year old female s/p R total knee arthroplasty ?  ?OT comments ? Pt./caregiver education was provided about A/E use for LE ADLs. Reviewed home set-up, and anticipated home/DME needs for her caregivers home, as well as in preparation for when she returns to her home from there. Pt. continues to benefit from OT services while in the hospital for ADL training, A/E training, and pt. education about home modification, and DME. Pt. Plans to return to her friends home for a few weeks upon discharge, who will assist pt. With daily care, and meals as needed. No follow-up OT services anticipated at discharge.  ? ?Recommendations for follow up therapy are one component of a multi-disciplinary discharge planning process, led by the attending physician.  Recommendations may be updated based on patient status, additional functional criteria and insurance authorization. ?   ?Follow Up Recommendations ? No OT follow up  ?  ?Assistance Recommended at Discharge    ?Patient can return home with the following ? A little help with bathing/dressing/bathroom;Help with stairs or ramp for entrance ?  ?Equipment Recommendations ?    ?  ?Recommendations for Other Services   ? ?  ?Precautions / Restrictions Precautions ?Precautions: Fall;Knee ?Restrictions ?Weight Bearing Restrictions: Yes ?RLE Weight Bearing: Weight bearing as tolerated  ? ? ?  ? ?Mobility Bed Mobility ?Overal bed mobility: Independent ?Bed Mobility: Sit to Supine ?  ?  ?  ?  ?  ?  ?  ? ?Transfers ?  ?Equipment used: Rolling walker (2 wheels) ?  ?Sit to Stand: Supervision ?  ?  ?  ?  ?  ?  ?  ?  ?Balance   ?  ?  ?  ?  ?  ?  ?  ?  ?  ?  ?  ?  ?  ?  ?  ?  ?  ?  ?   ? ?ADL either performed or assessed with clinical judgement  ? ?ADL   ?Eating/Feeding: Set up;Independent ?  ?Grooming: Set up;Independent ?  ?   ?  ?  ?  ?Upper Body Dressing : Independent;Set up ?  ?Lower Body Dressing: Minimal assistance;Set up ?  ?  ?  ?  ?  ?  ?  ?  ?  ?  ? ?Extremity/Trunk Assessment Upper Extremity Assessment ?Upper Extremity Assessment: Overall WFL for tasks assessed ?  ?Lower Extremity Assessment ?Lower Extremity Assessment: RLE deficits/detail ?RLE Deficits / Details: s/p R TKA POD2 ?  ?Cervical / Trunk Assessment ?Cervical / Trunk Assessment: Normal ?  ? ?Vision  Wears Glasses. No change  ?  ?  ?Perception   ?  ?Praxis   ?  ? ?Cognition   ?  ?  ?  ?  ?  ?  ?  ?  ?  ?  ?  ?  ?  ?  ?  ?  ?  ?  ?  ?  ?  ?   ?Exercises   ? ?  ?Shoulder Instructions   ? ? ?  ?General Comments Some nausea and increase pain at start of session, but this did not limit session with pt being able to complete continuous bout of ambulation.  ? ? ?Pertinent Vitals/ Pain         ? ?Home Living Family/patient expects to be discharged to:: Private residence ?Living Arrangements:  Alone ?Available Help at Discharge: Friend(s) ?Type of Home: Apartment ?Home Access: Level entry ?  ?  ?Home Layout: One level ?  ?  ?Bathroom Shower/Tub: Tub/shower unit ?  ?Bathroom Toilet: Standard ?  ?  ?Home Equipment: Cane - quad;Other (comment);Grab bars - tub/shower ?  ?Additional Comments: RW adjusted to pt height and given to pt ?  ? ?  ?Prior Functioning/Environment    ?  ?  ?  ?   ? ?Frequency ? Min 2X/week  ? ? ? ? ?  ?Progress Toward Goals ? ?OT Goals(current goals can now be found in the care plan section) ? Progress towards OT goals: Progressing toward goals ? ?Acute Rehab OT Goals ?Patient Stated Goal: Improve pain ?OT Goal Formulation: With patient ?Time For Goal Achievement: 04/20/22 ?Potential to Achieve Goals: Good  ?Plan     ? ?Co-evaluation ? ? ?   ?  ?  ?  ?  ? ?  ?AM-PAC OT "6 Clicks" Daily Activity     ?Outcome Measure ? ? Help from another person eating meals?: None ?Help from another person taking care of personal grooming?: None ?Help from another person  toileting, which includes using toliet, bedpan, or urinal?: A Little ?Help from another person bathing (including washing, rinsing, drying)?: A Little ?Help from another person to put on and taking off regular upper body clothing?: None ?Help from another person to put on and taking off regular lower body clothing?: None ?6 Click Score: 22 ? ?  ?End of Session Equipment Utilized During Treatment: Rolling walker (2 wheels) ? ?OT Visit Diagnosis: Unsteadiness on feet (R26.81) ?  ?Activity Tolerance Patient tolerated treatment well ?  ?Patient Left in chair;with call bell/phone within reach;with chair alarm set ?  ?Nurse Communication   ?  ? ?   ? ?Time: 1610-9604 ?OT Time Calculation (min): 16 min ? ?Charges: OT General Charges ?$OT Visit: 1 Visit ?OT Treatments ?$Self Care/Home Management : 8-22 mins ? ?Harrel Carina, MS, OTR/L  ? ?Harrel Carina ?04/07/2022, 12:05 PM ?

## 2022-04-07 NOTE — Progress Notes (Signed)
Patient was given verbal and written discharge instructions, she acknowledges understanding, patient was taken to car by wheelchair,no distress when leaving the floor. ?

## 2022-04-07 NOTE — Discharge Instructions (Signed)
? ?TOTAL KNEE REPLACEMENT POSTOPERATIVE DIRECTIONS ? ?Knee Rehabilitation, Guidelines Following Surgery  ?Results after knee surgery are often greatly improved when you follow the exercise, range of motion and muscle strengthening exercises prescribed by your doctor. Safety measures are also important to protect the knee from further injury. Any time any of these exercises cause you to have increased pain or swelling in your knee joint, decrease the amount until you are comfortable again and slowly increase them. If you have problems or questions, call your caregiver or physical therapist for advice.  ? ?HOME CARE INSTRUCTIONS  ?Remove items at home which could result in a fall. This includes throw rugs or furniture in walking pathways.  ?Continue to use polar care unit on the knee for pain and swelling from surgery. You may notice swelling that will progress down to the foot and ankle.  This is normal after surgery.  Elevate the leg when you are not up walking on it.   ?Continue to use the breathing machine which will help keep your temperature down.  It is common for your temperature to cycle up and down following surgery, especially at night when you are not up moving around and exerting yourself.  The breathing machine keeps your lungs expanded and your temperature down. ?Do not place pillow under knee, focus on keeping the knee STRAIGHT while resting ? ?DIET ?You may resume your previous home diet once your are discharged from the hospital. ? ?DRESSING / WOUND CARE / SHOWERING ?Please remove provena negative pressure dressing on 04/14/2022 and apply honey comb dressing. Keep dressing clean and dry at all times.  ? ?ACTIVITY ?Walk with your walker as instructed. ?Use walker as long as suggested by your caregivers. ?Avoid periods of inactivity such as sitting longer than an hour when not asleep. This helps prevent blood clots.  ?You may resume a sexual relationship in one month or when given the OK by your  doctor.  ?You may return to work once you are cleared by your doctor.  ?Do not drive a car for 6 weeks or until released by you surgeon.  ?Do not drive while taking narcotics. ? ?WEIGHT BEARING ?Weight bearing as tolerated with assist device (walker, cane, etc) as directed, use it as long as suggested by your surgeon or therapist, typically at least 4-6 weeks. ? ?POSTOPERATIVE CONSTIPATION PROTOCOL ?Constipation - defined medically as fewer than three stools per week and severe constipation as less than one stool per week. ? ?One of the most common issues patients have following surgery is constipation.  Even if you have a regular bowel pattern at home, your normal regimen is likely to be disrupted due to multiple reasons following surgery.  Combination of anesthesia, postoperative narcotics, change in appetite and fluid intake all can affect your bowels.  In order to avoid complications following surgery, here are some recommendations in order to help you during your recovery period. ? ?Colace (docusate) - Pick up an over-the-counter form of Colace or another stool softener and take twice a day as long as you are requiring postoperative pain medications.  Take with a full glass of water daily.  If you experience loose stools or diarrhea, hold the colace until you stool forms back up.  If your symptoms do not get better within 1 week or if they get worse, check with your doctor. ? ?Dulcolax (bisacodyl) - Pick up over-the-counter and take as directed by the product packaging as needed to assist with the movement of your bowels.  Take with a full glass of water.  Use this product as needed if not relieved by Colace only.  ? ?MiraLax (polyethylene glycol) - Pick up over-the-counter to have on hand.  MiraLax is a solution that will increase the amount of water in your bowels to assist with bowel movements.  Take as directed and can mix with a glass of water, juice, soda, coffee, or tea.  Take if you go more than two  days without a movement. ?Do not use MiraLax more than once per day. Call your doctor if you are still constipated or irregular after using this medication for 7 days in a row. ? ?If you continue to have problems with postoperative constipation, please contact the office for further assistance and recommendations.  If you experience "the worst abdominal pain ever" or develop nausea or vomiting, please contact the office immediatly for further recommendations for treatment. ? ?ITCHING ? If you experience itching with your medications, try taking only a single pain pill, or even half a pain pill at a time.  You can also use Benadryl over the counter for itching or also to help with sleep.  ? ?TED HOSE STOCKINGS ?Wear the elastic stockings on both legs for six weeks following surgery during the day but you may remove then at night for sleeping. ? ?MEDICATIONS ?See your medication summary on the ?After Visit Summary? that the nursing staff will review with you prior to discharge.  You may have some home medications which will be placed on hold until you complete the course of blood thinner medication.  It is important for you to complete the blood thinner medication as prescribed by your surgeon.  Continue your approved medications as instructed at time of discharge. ? ?PRECAUTIONS ?If you experience chest pain or shortness of breath - call 911 immediately for transfer to the hospital emergency department.  ?If you develop a fever greater that 101 F, purulent drainage from wound, increased redness or drainage from wound, foul odor from the wound/dressing, or calf pain - CONTACT YOUR SURGEON.   ?                                                ?FOLLOW-UP APPOINTMENTS ?Make sure you keep all of your appointments after your operation with your surgeon and caregivers. You should call the office at the above phone number and make an appointment for approximately two weeks after the date of your surgery or on the date  instructed by your surgeon outlined in the "After Visit Summary". ? ? ?RANGE OF MOTION AND STRENGTHENING EXERCISES  ?Rehabilitation of the knee is important following a knee injury or an operation. After just a few days of immobilization, the muscles of the thigh which control the knee become weakened and shrink (atrophy). Knee exercises are designed to build up the tone and strength of the thigh muscles and to improve knee motion. Often times heat used for twenty to thirty minutes before working out will loosen up your tissues and help with improving the range of motion but do not use heat for the first two weeks following surgery. These exercises can be done on a training (exercise) mat, on the floor, on a table or on a bed. Use what ever works the best and is most comfortable for you Knee exercises include:  ?Leg Lifts - While your  knee is still immobilized in a splint or cast, you can do straight leg raises. Lift the leg to 60 degrees, hold for 3 sec, and slowly lower the leg. Repeat 10-20 times 2-3 times daily. Perform this exercise against resistance later as your knee gets better.  ?Quad and Hamstring Sets - Tighten up the muscle on the front of the thigh (Quad) and hold for 5-10 sec. Repeat this 10-20 times hourly. Hamstring sets are done by pushing the foot backward against an object and holding for 5-10 sec. Repeat as with quad sets.  ?Leg Slides: Lying on your back, slowly slide your foot toward your buttocks, bending your knee up off the floor (only go as far as is comfortable). Then slowly slide your foot back down until your leg is flat on the floor again. ?Angel Wings: Lying on your back spread your legs to the side as far apart as you can without causing discomfort.  ?A rehabilitation program following serious knee injuries can speed recovery and prevent re-injury in the future due to weakened muscles. Contact your doctor or a physical therapist for more information on knee rehabilitation.  ? ?IF YOU  ARE TRANSFERRED TO A SKILLED REHAB FACILITY ?If the patient is transferred to a skilled rehab facility following release from the hospital, a list of the current medications will be sent to the facility for the patie

## 2022-04-07 NOTE — Progress Notes (Addendum)
? ?Subjective: ?2 Days Post-Op Procedure(s) (LRB): ?TOTAL KNEE ARTHROPLASTY (Right) ?Tammy Arias reports pain as mild.  Mild nausea, no vomiting ?Tammy Arias is well, and has had no acute complaints or problems ?Denies any CP, SOB, ABD pain. ?We will continue therapy today.  ?Plan is to go Home after hospital stay. ? ?Objective: ?Vital signs in last 24 hours: ?Temp:  [97.5 ?F (36.4 ?C)-98.1 ?F (36.7 ?C)] 97.7 ?F (36.5 ?C) (05/11 0801) ?Pulse Rate:  [79-105] 105 (05/11 0801) ?Resp:  [16-18] 16 (05/11 0801) ?BP: (103-120)/(66-82) 114/70 (05/11 0801) ?SpO2:  [97 %-100 %] 100 % (05/11 0801) ? ?Intake/Output from previous day: ?05/10 0701 - 05/11 0700 ?In: 827.1 [I.V.:827.1] ?Out: 25 [Drains:25] ?Intake/Output this shift: ?No intake/output data recorded. ? ?Recent Labs  ?  04/05/22 ?1216 04/06/22 ?0424 04/07/22 ?0320  ?HGB 11.2* 10.7* 10.6*  ? ?Recent Labs  ?  04/06/22 ?0424 04/07/22 ?0320  ?WBC 5.6 6.1  ?RBC 3.25* 3.22*  ?HCT 32.5* 32.4*  ?PLT 100* 105*  ? ?Recent Labs  ?  04/05/22 ?1216 04/06/22 ?0424  ?NA  --  140  ?K  --  4.4  ?CL  --  104  ?CO2  --  29  ?BUN  --  18  ?CREATININE 1.18* 1.10*  ?GLUCOSE  --  126*  ?CALCIUM  --  8.6*  ? ?Recent Labs  ?  04/05/22 ?0640 04/07/22 ?0320  ?INR 1.1 1.3*  ? ? ?EXAM ?General - Tammy Arias is Alert, Appropriate, and Oriented ?Extremity - Neurovascular intact ?Sensation intact distally ?Intact pulses distally ?Dorsiflexion/Plantar flexion intact ?Dressing - dressing C/D/I and no drainage, Prevena intact without drainage ?Motor Function - intact, moving foot and toes well on exam.  ? ?Past Medical History:  ?Diagnosis Date  ? Anemia   ? Arthritis   ? Atrial fibrillation ([place])   ? a.) CHA2DS2-VASc = 7 (age, sex, HFrEF, HTN, TIA x 2, T2DM). b.) rate/rhythm controlled on oral carvedilol; chronically anticoagulated with warfarin.  ? Cardiac murmur   ? Chronic anticoagulation   ? a.) warfarin  ? Cor triatriatum   ? a.) s/p repair 01/2004  ? Coronary artery disease   ? a.) 3v CABG 01/28/2004. b.)  R/LHC 03/29/2017: small RCA with occluded SVG; no significant Dz. Chronically occluded LAD with patent SVG to D1/LAD. Insignificant Dz in LCx. LM normal.  ? Cortical cataract   ? DOE (dyspnea on exertion)   ? DOE (dyspnea on exertion)   ? GERD (gastroesophageal reflux disease)   ? HFrEF (heart failure with reduced ejection fraction) ([place])   ? a.) TTE 10/27/2014: EF 40%; glob HK; mild BAE; triv AR, mild MR/PR, mod TR.  b.) TTE 03/15/2017: EF 30%; glob HK; mod BAE; mod pHTN (RVSP 54.8 mmHg); mild PR, mod MR; sev TR.  c.) R/LHC 03/29/2017: EF 30-35%. d.)TTE 10/25/2018: EF 35%; mod BAE; mod BVE; glob HK; triv PR, mod MR, sev TR; RVSP 57.7 mmHg; G2DD.  ? History of shingles 2004  ? Hyperlipidemia   ? Hypertension   ? Hypothyroidism   ? IBS (irritable bowel syndrome)   ? Ischemic cardiomyopathy   ? a.) TTE 10/27/2014: EF 40%. b.) TTE 03/15/2017: EF 30%. c.) R/LHC 04/01/2017: EF 30-35%. d.) TTE 10/25/2018: EF 35%  ? Lumbar scoliosis   ? Lumbar spinal stenosis   ? Migraines   ? Osteoporosis   ? Pulmonary HTN ([place])   ? a.) TTE 03/15/2017: EF 30-35%; RVSP 54.8 mmHg. b.) R/LHC 03/29/2017: mean PA 33 mmHg, PCWP 22 mmHg, LVEDP 14 mmHg, mean AO  79 mmHg; CO 7.89 L/min, CI 4.61 L/min/m?  ? 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 T2DM (type 2 diabetes mellitus) ([place])   ? TIA (transient ischemic attack) 05/29/2016  ? Vitamin B 12 deficiency   ? ? ?Assessment/Plan:   ?2 Days Post-Op Procedure(s) (LRB): ?TOTAL KNEE ARTHROPLASTY (Right) ?Principal Problem: ?  S/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, right ?Active Problems: ?  S/P TKR (total knee replacement) ? ?Estimated body mass index is 28.15 kg/m? as calculated from the following: ?  Height as of this encounter: '5\' 1"'$  (1.549 m). ?  Weight as of this encounter: 67.6 kg. ?Advance diet ?Up with therapy ?Pain well controlled ? ?Vital signs stable ? ?Care management to assist with discharge. Anticipate discharge to home with HHPT today.  ? ?INR 1.3, continue bridging with Lovenox.  Recheck INR  with HH ? ?DVT Prophylaxis - Lovenox, Coumadin, and TED hose, SCDs ?Weight-Bearing as tolerated to right leg ? ? ?T. Rachelle Hora, PA-C ?[place] ?04/07/2022, 8:42 AM ?  ?

## 2022-04-07 NOTE — Discharge Summary (Addendum)
?Physician Discharge Summary  ?Patient ID: ?Tammy Arias ?MRN: [id number] ?DOB/AGE: [birth year] 66 y.o. ? ?Admit date: 04/05/2022 ?Discharge date: 04/07/2022 ? ?Admission Diagnoses:  ?S/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, right [Z96.651] ?S/P TKR (total knee replacement) [Z96.659] ? ? ?Discharge Diagnoses: ?Patient Active Problem List  ? Diagnosis Date Noted  ? S/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, right 04/05/2022  ? S/P TKR (total knee replacement) 04/05/2022  ? Colon cancer screening   ? Iron deficiency anemia   ? Gastric AVM   ? Barrett's esophagus without dysplasia   ? Angiodysplasia of intestinal tract   ? Internal hemorrhoids   ? Melena 12/17/2018  ? Pulmonary hypertension ([place]) 03/29/2017  ? Coronary artery disease involving native coronary artery of native heart with unstable angina pectoris ([place]) 03/21/2017  ? Ischemic cardiomyopathy 03/21/2017  ? Hyperlipidemia 10/08/2014  ? Hypertension 10/08/2014  ? Type 2 diabetes mellitus ([place]) 10/08/2014  ? ? ?Past Medical History:  ?Diagnosis Date  ? Anemia   ? Arthritis   ? Atrial fibrillation ([place])   ? a.) CHA2DS2-VASc = 7 (age, sex, HFrEF, HTN, TIA x 2, T2DM). b.) rate/rhythm controlled on oral carvedilol; chronically anticoagulated with warfarin.  ? Cardiac murmur   ? Chronic anticoagulation   ? a.) warfarin  ? Cor triatriatum   ? a.) s/p repair 01/2004  ? Coronary artery disease   ? a.) 3v CABG 01/28/2004. b.) R/LHC 03/29/2017: small RCA with occluded SVG; no significant Dz. Chronically occluded LAD with patent SVG to D1/LAD. Insignificant Dz in LCx. LM normal.  ? Cortical cataract   ? DOE (dyspnea on exertion)   ? DOE (dyspnea on exertion)   ? GERD (gastroesophageal reflux disease)   ? HFrEF (heart failure with reduced ejection fraction) ([place])   ? a.) TTE 10/27/2014: EF 40%; glob HK; mild BAE; triv AR, mild MR/PR, mod TR.  b.) TTE 03/15/2017: EF 30%; glob HK; mod BAE; mod pHTN (RVSP 54.8 mmHg); mild PR, mod MR; sev TR.  c.) R/LHC 03/29/2017: EF  30-35%. d.)TTE 10/25/2018: EF 35%; mod BAE; mod BVE; glob HK; triv PR, mod MR, sev TR; RVSP 57.7 mmHg; G2DD.  ? History of shingles 2004  ? Hyperlipidemia   ? Hypertension   ? Hypothyroidism   ? IBS (irritable bowel syndrome)   ? Ischemic cardiomyopathy   ? a.) TTE 10/27/2014: EF 40%. b.) TTE 03/15/2017: EF 30%. c.) R/LHC 04/01/2017: EF 30-35%. d.) TTE 10/25/2018: EF 35%  ? Lumbar scoliosis   ? Lumbar spinal stenosis   ? Migraines   ? Osteoporosis   ? Pulmonary HTN ([place])   ? a.) TTE 03/15/2017: EF 30-35%; RVSP 54.8 mmHg. b.) R/LHC 03/29/2017: mean PA 33 mmHg, PCWP 22 mmHg, LVEDP 14 mmHg, mean AO 79 mmHg; CO 7.89 L/min, CI 4.61 L/min/m?  ? 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 T2DM (type 2 diabetes mellitus) ([place])   ? TIA (transient ischemic attack) 05/29/2016  ? Vitamin B 12 deficiency   ? ?  ?Transfusion: none ?  ?Consultants (if any):  ? ?Discharged Condition: Improved ? ?Hospital Course: Tammy Arias is an 66 y.o. female who was admitted 04/05/2022 with a diagnosis of S/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, right and went to the operating room on 04/05/2022 and underwent the above named procedures.  ?  ?Surgeries: Procedure(s): ?TOTAL KNEE ARTHROPLASTY on 04/05/2022 ?Patient tolerated the surgery well. Taken to PACU where she was stabilized and then transferred to the orthopedic floor. ? ?Started on Lovenox 30  mg q 12 hrs, home coumadin, SCDs applied bilaterally at 80 mm. Heels elevated on bed with rolled towels. No evidence of DVT. Negative [place]. ?Physical therapy started on day #1 for gait training and transfer. OT started day #1 for ADL and assisted devices. ? ?Patient's foley was d/c on day #1. Patient's IV was d/c on day #2. ? ?On post op day #2 patient was stable and ready for discharge to home with outpatient PT ? ?Will check INR in clinic during outpatient PT. ? ?She was given perioperative antibiotics:  ?Anti-infectives (From admission, onward)  ? ? Start     Dose/Rate Route Frequency Ordered Stop  ? 04/05/22  1300  ceFAZolin (ANCEF) IVPB 1 g/50 mL premix       ? 1 g ?100 mL/hr over 30 Minutes Intravenous Every 6 hours 04/05/22 1205 04/06/22 0059  ? 04/05/22 1610  ceFAZolin (ANCEF) 2-4 GM/100ML-% IVPB       ?Note to Pharmacy: Trudie Reed S: cabinet override  ?    04/05/22 9604 04/05/22 0739  ? 04/05/22 0600  ceFAZolin (ANCEF) IVPB 2g/100 mL premix       ? 2 g ?200 mL/hr over 30 Minutes Intravenous On call to O.R. 04/05/22 0145 04/05/22 0750  ? ?  ?. ? ?She was given sequential compression devices, early ambulation, and Lovenox TEDs for DVT prophylaxis. ? ?She benefited maximally from the hospital stay and there were no complications.   ? ?Recent vital signs:  ?Vitals:  ? 04/07/22 0925 04/07/22 1137  ?BP:  102/72  ?Pulse: (!) 111 98  ?Resp:  16  ?Temp:  (!) 97.4 ?F (36.3 ?C)  ?SpO2: 98% 100%  ? ? ?Recent laboratory studies:  ?Lab Results  ?Component Value Date  ? HGB 10.6 (L) 04/07/2022  ? HGB 10.7 (L) 04/06/2022  ? HGB 11.2 (L) 04/05/2022  ? ?Lab Results  ?Component Value Date  ? WBC 6.1 04/07/2022  ? PLT 105 (L) 04/07/2022  ? ?Lab Results  ?Component Value Date  ? INR 1.3 (H) 04/07/2022  ? ?Lab Results  ?Component Value Date  ? NA 140 04/06/2022  ? K 4.4 04/06/2022  ? CL 104 04/06/2022  ? CO2 29 04/06/2022  ? BUN 18 04/06/2022  ? CREATININE 1.10 (H) 04/06/2022  ? GLUCOSE 126 (H) 04/06/2022  ? ? ?Discharge Medications:   ?Allergies as of 04/07/2022   ? ?   Reactions  ? Vioxx [rofecoxib] Swelling  ? ?  ? ?  ?Medication List  ?  ? ?STOP taking these medications   ? ?HYDROcodone-acetaminophen 5-325 MG tablet ?Commonly known as: NORCO/VICODIN ?Replaced by: HYDROcodone-acetaminophen 7.5-325 MG tablet ?  ? ?  ? ?TAKE these medications   ? ?albuterol 108 (90 Base) MCG/ACT inhaler ?Commonly known as: VENTOLIN HFA ?Inhale 2 puffs into the lungs every 6 (six) hours as needed for wheezing or shortness of breath. ?  ?aspirin EC 81 MG tablet ?Take 81 mg by mouth daily. ?  ?carvedilol 3.125 MG tablet ?Commonly known as: COREG ?Take 3.125  mg by mouth 2 (two) times daily with a meal. ?  ?diclofenac Sodium 1 % Gel ?Commonly known as: VOLTAREN ?Apply 1 application. topically 4 (four) times daily as needed (pain). ?  ?docusate sodium 100 MG capsule ?Commonly known as: COLACE ?Take 1 capsule (100 mg total) by mouth 2 (two) times daily. ?  ?enoxaparin 100 MG/ML injection ?Commonly known as: Lovenox ?Inject 1 mL (100 mg total) into the skin daily for 10 doses. Discontinue lovenox once INR  therapeutic. ?  ?Entresto 24-26 MG ?Generic drug: sacubitril-valsartan ?Take 1 tablet by mouth 2 (two) times daily. ?  ?fluticasone 50 MCG/ACT nasal spray ?Commonly known as: FLONASE ?Place 1 spray into both nostrils at bedtime. ?  ?furosemide 40 MG tablet ?Commonly known as: LASIX ?Take 80 mg by mouth daily. ?  ?HYDROcodone-acetaminophen 7.5-325 MG tablet ?Commonly known as: NORCO ?Take 1-2 tablets by mouth every 4 (four) hours as needed for severe pain (pain score 7-10). ?Replaces: HYDROcodone-acetaminophen 5-325 MG tablet ?  ?latanoprost 0.005 % ophthalmic solution ?Commonly known as: XALATAN ?Place 1 drop into both eyes at bedtime. ?  ?levothyroxine 50 MCG tablet ?Commonly known as: SYNTHROID ?Take 50 mcg by mouth daily before breakfast. ?  ?lovastatin 10 MG tablet ?Commonly known as: MEVACOR ?Take 10 mg by mouth at bedtime. ?  ?metFORMIN 1000 MG tablet ?Commonly known as: GLUCOPHAGE ?Take 1,000 mg by mouth 2 (two) times daily with a meal. ?  ?methocarbamol 500 MG tablet ?Commonly known as: ROBAXIN ?Take 1 tablet (500 mg total) by mouth every 6 (six) hours as needed for muscle spasms. ?  ?ondansetron 4 MG tablet ?Commonly known as: ZOFRAN ?Take 1 tablet (4 mg total) by mouth every 6 (six) hours as needed for nausea. ?  ?pantoprazole 40 MG tablet ?Commonly known as: PROTONIX ?Take 40 mg by mouth daily. ?  ?polyethylene glycol 17 g packet ?Commonly known as: MIRALAX / GLYCOLAX ?Take 17 g by mouth daily as needed for mild constipation. ?  ?spironolactone 25 MG  tablet ?Commonly known as: ALDACTONE ?Take 25 mg by mouth daily. ?  ?traMADol 50 MG tablet ?Commonly known as: ULTRAM ?Take 1 tablet (50 mg total) by mouth every 6 (six) hours as needed. ?  ?vitamin B-12 1000

## 2022-04-20 ENCOUNTER — Other Ambulatory Visit: Payer: Self-pay

## 2022-04-20 DIAGNOSIS — D509 Iron deficiency anemia, unspecified: Secondary | ICD-10-CM

## 2022-04-22 ENCOUNTER — Encounter: Payer: Self-pay | Admitting: Oncology

## 2022-04-22 ENCOUNTER — Inpatient Hospital Stay: Payer: Medicare Other | Attending: Oncology

## 2022-04-22 ENCOUNTER — Inpatient Hospital Stay: Payer: Medicare Other | Admitting: Oncology

## 2022-04-22 DIAGNOSIS — D709 Neutropenia, unspecified: Secondary | ICD-10-CM | POA: Diagnosis not present

## 2022-04-22 DIAGNOSIS — D61818 Other pancytopenia: Secondary | ICD-10-CM | POA: Diagnosis not present

## 2022-04-22 DIAGNOSIS — Z87891 Personal history of nicotine dependence: Secondary | ICD-10-CM | POA: Insufficient documentation

## 2022-04-22 DIAGNOSIS — D509 Iron deficiency anemia, unspecified: Secondary | ICD-10-CM

## 2022-04-22 LAB — CBC WITH DIFFERENTIAL/PLATELET
Abs Immature Granulocytes: 0.03 10*3/uL (ref 0.00–0.07)
Basophils Absolute: 0 10*3/uL (ref 0.0–0.1)
Basophils Relative: 0 %
Eosinophils Absolute: 0.3 10*3/uL (ref 0.0–0.5)
Eosinophils Relative: 5 %
HCT: 34.5 % — ABNORMAL LOW (ref 36.0–46.0)
Hemoglobin: 11.2 g/dL — ABNORMAL LOW (ref 12.0–15.0)
Immature Granulocytes: 1 %
Lymphocytes Relative: 8 %
Lymphs Abs: 0.5 10*3/uL — ABNORMAL LOW (ref 0.7–4.0)
MCH: 32.9 pg (ref 26.0–34.0)
MCHC: 32.5 g/dL (ref 30.0–36.0)
MCV: 101.5 fL — ABNORMAL HIGH (ref 80.0–100.0)
Monocytes Absolute: 0.6 10*3/uL (ref 0.1–1.0)
Monocytes Relative: 9 %
Neutro Abs: 4.8 10*3/uL (ref 1.7–7.7)
Neutrophils Relative %: 77 %
Platelets: 241 10*3/uL (ref 150–400)
RBC: 3.4 MIL/uL — ABNORMAL LOW (ref 3.87–5.11)
RDW: 14.1 % (ref 11.5–15.5)
WBC: 6.2 10*3/uL (ref 4.0–10.5)
nRBC: 0 % (ref 0.0–0.2)

## 2022-04-22 LAB — COMPREHENSIVE METABOLIC PANEL
ALT: 22 U/L (ref 0–44)
AST: 33 U/L (ref 15–41)
Albumin: 4.2 g/dL (ref 3.5–5.0)
Alkaline Phosphatase: 141 U/L — ABNORMAL HIGH (ref 38–126)
Anion gap: 7 (ref 5–15)
BUN: 22 mg/dL (ref 8–23)
CO2: 31 mmol/L (ref 22–32)
Calcium: 9.2 mg/dL (ref 8.9–10.3)
Chloride: 96 mmol/L — ABNORMAL LOW (ref 98–111)
Creatinine, Ser: 1.2 mg/dL — ABNORMAL HIGH (ref 0.44–1.00)
GFR, Estimated: 50 mL/min — ABNORMAL LOW (ref >60)
Glucose, Bld: 123 mg/dL — ABNORMAL HIGH (ref 70–99)
Potassium: 4.8 mmol/L (ref 3.5–5.1)
Sodium: 134 mmol/L — ABNORMAL LOW (ref 135–145)
Total Bilirubin: 0.9 mg/dL (ref 0.3–1.2)
Total Protein: 7.6 g/dL (ref 6.5–8.1)

## 2022-04-22 NOTE — Progress Notes (Signed)
Fatigued  but she had total knee replacement recently

## 2022-04-22 NOTE — Progress Notes (Signed)
Hematology/Oncology Consult note [facility]  [phone] [phone]  Patient Care Team: [place] Pink, MD as PCP - General (Family Medicine) Sindy Guadeloupe, MD as Consulting Physician (Hematology and Oncology)   Name of the patient: Tammy Arias  [id number]  [birth year]   Date of visit: 04/22/22  Diagnosis- lymphopenia/ neutropenia and thrombocytopenia possibly autoimmune etiology  Chief complaint/ Reason for visit-routine follow-up of leukopenia and thrombocytopenia  Heme/Onc history: patient is a 66 year old Caucasian female with a past medical history significant for congestive heart failure with an EF of 35%, history of A. 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 EGD showed salmon-colored mucosa suspicious for short segment Barrett's.  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  Nonbleeding erosive gastropathy.  Normal examined duodenum.  No specimens collected.Colonoscopy showed 7 nonbleeding colonic angiectasia treated with APC.  Nonbleeding internal hemorrhoids.  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  On a couple of occasions her white count has been normal at around 4.1.  Her white count was noted to be 2.4 on her recent blood work and prior to that a month ago it was 2.7.  Differential mainly shows lymphopenia with a neutrophil count that fluctuates between 1.8-3.7.  She is also developed gradual onset thrombocytopenia over the last 2 years and her platelet counts have gradually drifted down from 1 45-1 07 in March 2022.  More recently in April she was  noted to have a platelet count of 98.   Bone marrow biopsy showed slightly hypercellular marrow with trilineage-hematopoiesis with abundant megakaryocytes with normal morphology.  Generally mild nonspecific changes involving the myeloid cell lines with no increase in blasts.  Cytogenetics and FISH studies currently pending.  Myeloma panel, B12, folate unremarkable.  ANA comprehensive panel negative.  Interval history-patient recently had a right knee replacement surgery 2 weeks ago.  She reports ongoing pain but denies other complaints  ECOG PS- 2 Pain scale- 3   Review of systems- Review of Systems  Constitutional:  Positive for malaise/fatigue. Negative for chills, fever and weight loss.  HENT:  Negative for congestion, ear discharge and nosebleeds.   Eyes:  Negative for blurred vision.  Respiratory:  Negative for cough, hemoptysis, sputum production, shortness of breath and wheezing.   Cardiovascular:  Negative for chest pain, palpitations, orthopnea and claudication.  Gastrointestinal:  Negative for abdominal pain, blood in stool, constipation, diarrhea, heartburn, melena, nausea and vomiting.  Genitourinary:  Negative for dysuria, flank pain, frequency, hematuria and urgency.  Musculoskeletal:  Positive for joint pain. Negative for back pain and myalgias.  Skin:  Negative for rash.  Neurological:  Negative for dizziness, tingling, focal weakness, seizures, weakness and headaches.  Endo/Heme/Allergies:  Does not bruise/bleed easily.  Psychiatric/Behavioral:  Negative for depression and suicidal ideas. The patient does not have insomnia.       Allergies  Allergen Reactions   Vioxx [Rofecoxib] Swelling     Past Medical History:  Diagnosis Date   AC (acromioclavicular) joint bone spurs, right    Anemia    Arthritis    Atrial fibrillation ([place])    a.) CHA2DS2-VASc = 7 (age,  sex, HFrEF, HTN, TIA x 2, T2DM). b.) rate/rhythm controlled on oral carvedilol; chronically anticoagulated  with warfarin.   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 b.) R/LHC 03/29/2017: small RCA with occluded SVG; no significant Dz. Chronically occluded LAD with patent SVG to D1/LAD. Insignificant Dz in LCx. LM normal.   Cortical cataract    DOE (dyspnea on exertion)    DOE (dyspnea on exertion)    GERD (gastroesophageal reflux disease)    HFrEF (heart failure with reduced ejection fraction) ([place])    a.) TTE 10/27/2014: EF 40%; glob HK; mild BAE; triv AR, mild MR/PR, mod TR.  b.) TTE 03/15/2017: EF 30%; glob HK; mod BAE; mod pHTN (RVSP 54.8 mmHg); mild PR, mod MR; sev TR.  c.) R/LHC 03/29/2017: EF 30-35%. d.)TTE 10/25/2018: EF 35%; mod BAE; mod BVE; glob HK; triv PR, mod MR, sev TR; RVSP 57.7 mmHg; G2DD.   History of shingles 2004   Hyperlipidemia    Hypertension    Hypothyroidism    IBS (irritable bowel syndrome)    Ischemic cardiomyopathy    a.) TTE 10/27/2014: EF 40%. b.) TTE 03/15/2017: EF 30%. c.) R/LHC 04/01/2017: EF 30-35%. d.) TTE 10/25/2018: EF 35%   Lumbar scoliosis    Lumbar spinal stenosis    Migraines    Osteoporosis    Pulmonary HTN ([place])    a.) TTE 03/15/2017: EF 30-35%; RVSP 54.8 mmHg. 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   T2DM (type 2 diabetes mellitus) ([place])    TIA (transient ischemic attack) 05/29/2016   Vitamin B 12 deficiency      Past Surgical History:  Procedure Laterality Date   CARDIAC CATHETERIZATION     CATARACT EXTRACTION W/ INTRAOCULAR LENS IMPLANT Bilateral    Cataract Extraction with IOL   COLONOSCOPY N/A 12/19/2018   Procedure: COLONOSCOPY;  Surgeon: Virgel Manifold, MD;  Location: [facility] ENDOSCOPY;  Service: Endoscopy;  Laterality: N/A;   COLONOSCOPY WITH PROPOFOL N/A 06/17/2020   Procedure: COLONOSCOPY WITH PROPOFOL;   Surgeon: Virgel Manifold, MD;  Location: [facility] ENDOSCOPY;  Service: Endoscopy;  Laterality: N/A;   COR TRIATRIATUM REPAIR N/A 01/28/2004   CORONARY ARTERY BYPASS GRAFT N/A 01/28/2004   3v CABG   ESOPHAGOGASTRODUODENOSCOPY N/A 12/19/2018   Procedure: ESOPHAGOGASTRODUODENOSCOPY (EGD);  Surgeon: Virgel Manifold, MD;  Location: [facility] ENDOSCOPY;  Service: Endoscopy;  Laterality: N/A;   ESOPHAGOGASTRODUODENOSCOPY (EGD) WITH PROPOFOL N/A 06/17/2020   Procedure: ESOPHAGOGASTRODUODENOSCOPY (EGD) WITH PROPOFOL;  Surgeon: Virgel Manifold, MD;  Location: [facility] ENDOSCOPY;  Service: Endoscopy;  Laterality: N/A;   RIGHT/LEFT HEART CATH AND CORONARY ANGIOGRAPHY Bilateral 03/29/2017   Procedure: Right/Left Heart Cath and Coronary Angiography;  Surgeon: Teodoro Spray, MD;  Location: [facility] [facility];  Service: Cardiovascular;  Laterality: Bilateral;   TOTAL KNEE ARTHROPLASTY Right 04/05/2022   Procedure: TOTAL KNEE ARTHROPLASTY;  Surgeon: Hessie Knows, MD;  Location: [facility] ORS;  Service: Orthopedics;  Laterality: Right;   TUBAL LIGATION     VENTRAL HERNIA REPAIR N/A 04/21/2017   Procedure: HERNIA REPAIR VENTRAL ADULT;  Surgeon: Leonie Green, MD;  Location: [facility] ORS;  Service: General;  Laterality: N/A;    Social History   Socioeconomic History   Marital status: Widowed  Spouse name: Not on file   Number of children: Not on file   Years of education: Not on file   Highest education level: Not on file  Occupational History   Occupation: certified nursing assistant  Tobacco Use   Smoking status: Former    Types: Cigarettes    Quit date: 03/30/1999    Years since quitting: 23.0   Smokeless tobacco: Never  Vaping Use   Vaping Use: Never used  Substance and Sexual Activity   Alcohol use: No   Drug use: No   Sexual activity: Not Currently  Other Topics Concern   Not on file  Social History Narrative   Not on file   Social Determinants of Health   Financial Resource Strain:  Not on file  Food Insecurity: Not on file  Transportation Needs: Not on file  Physical Activity: Not on file  Stress: Not on file  Social Connections: Not on file  Intimate Partner Violence: Not on file    Family History  Problem Relation Age of Onset   Cancer Sister        lung   Cancer Sister        cervical   Valvular heart disease Mother    Diabetes Father    Breast cancer Neg Hx      Current Outpatient Medications:    albuterol (PROVENTIL HFA;VENTOLIN HFA) 108 (90 Base) MCG/ACT inhaler, Inhale 2 puffs into the lungs every 6 (six) hours as needed for wheezing or shortness of breath., Disp: , Rfl:    aspirin EC 81 MG tablet, Take 81 mg by mouth daily., Disp: , Rfl:    carvedilol (COREG) 3.125 MG tablet, Take 3.125 mg by mouth 2 (two) times daily with a meal., Disp: , Rfl:    diclofenac Sodium (VOLTAREN) 1 % GEL, Apply 1 application. topically 4 (four) times daily as needed (pain)., Disp: , Rfl:    docusate sodium (COLACE) 100 MG capsule, Take 1 capsule (100 mg total) by mouth 2 (two) times daily., Disp: 10 capsule, Rfl: 0   ENTRESTO 24-26 MG, Take 1 tablet by mouth 2 (two) times daily., Disp: , Rfl:    fluticasone (FLONASE) 50 MCG/ACT nasal spray, Place 1 spray into both nostrils at bedtime., Disp: , Rfl:    furosemide (LASIX) 40 MG tablet, Take 80 mg by mouth daily. , Disp: , Rfl:    HYDROcodone-acetaminophen (NORCO) 7.5-325 MG tablet, Take 1-2 tablets by mouth every 4 (four) hours as needed for severe pain (pain score 7-10)., Disp: 30 tablet, Rfl: 0   latanoprost (XALATAN) 0.005 % ophthalmic solution, Place 1 drop into both eyes at bedtime., Disp: , Rfl:    levothyroxine (SYNTHROID) 50 MCG tablet, Take 50 mcg by mouth daily before breakfast., Disp: , Rfl:    lovastatin (MEVACOR) 10 MG tablet, Take 10 mg by mouth at bedtime., Disp: , Rfl:    metFORMIN (GLUCOPHAGE) 1000 MG tablet, Take 1,000 mg by mouth 2 (two) times daily with a meal., Disp: , Rfl:    methocarbamol (ROBAXIN)  500 MG tablet, Take 1 tablet (500 mg total) by mouth every 6 (six) hours as needed for muscle spasms., Disp: 30 tablet, Rfl: 0   pantoprazole (PROTONIX) 40 MG tablet, Take 40 mg by mouth daily., Disp: , Rfl:    polyethylene glycol (MIRALAX / GLYCOLAX) 17 g packet, Take 17 g by mouth daily as needed for mild constipation., Disp: 14 each, Rfl: 0   spironolactone (ALDACTONE) 25 MG tablet, Take 25 mg by  mouth daily., Disp: , Rfl:    traMADol (ULTRAM) 50 MG tablet, Take 1 tablet (50 mg total) by mouth every 6 (six) hours as needed., Disp: 30 tablet, Rfl: 0   vitamin B-12 (CYANOCOBALAMIN) 1000 MCG tablet, Take 1,000 mcg by mouth daily., Disp: , Rfl:    warfarin (COUMADIN) 3 MG tablet, Take 3-4.5 mg by mouth See admin instructions. 4.5 mg on Monday, 3 mg all other days, Disp: , Rfl:    ondansetron (ZOFRAN) 4 MG tablet, Take 1 tablet (4 mg total) by mouth every 6 (six) hours as needed for nausea. (Patient not taking: Reported on 04/22/2022), Disp: 20 tablet, Rfl: 0  Physical exam:  Physical Exam Constitutional:      General: She is not in acute distress.    Comments: Ambulates with a walker  Cardiovascular:     Rate and Rhythm: Normal rate and regular rhythm.     Heart sounds: Normal heart sounds.  Pulmonary:     Effort: Pulmonary effort is normal.     Breath sounds: Normal breath sounds.  Abdominal:     General: Bowel sounds are normal.     Palpations: Abdomen is soft.  Skin:    General: Skin is warm and dry.  Neurological:     Mental Status: She is alert and oriented to person, place, and time.        Latest Ref Rng & Units 04/22/2022   10:02 AM  CMP  Glucose 70 - 99 mg/dL 123    BUN 8 - 23 mg/dL 22    Creatinine 0.44 - 1.00 mg/dL 1.20    Sodium 135 - 145 mmol/L 134    Potassium 3.5 - 5.1 mmol/L 4.8    Chloride 98 - 111 mmol/L 96    CO2 22 - 32 mmol/L 31    Calcium 8.9 - 10.3 mg/dL 9.2    Total Protein 6.5 - 8.1 g/dL 7.6    Total Bilirubin 0.3 - 1.2 mg/dL 0.9    Alkaline Phos 38 -  126 U/L 141    AST 15 - 41 U/L 33    ALT 0 - 44 U/L 22        Latest Ref Rng & Units 04/22/2022   10:02 AM  CBC  WBC 4.0 - 10.5 K/uL 6.2    Hemoglobin 12.0 - 15.0 g/dL 11.2    Hematocrit 36.0 - 46.0 % 34.5    Platelets 150 - 400 K/uL 241      No images are attached to the encounter.  DG Knee 1-2 Views Right  Result Date: 04/05/2022 CLINICAL DATA:  Status post right total knee arthroplasty. EXAM: RIGHT KNEE - 1-2 VIEW COMPARISON:  01/21/2022 FINDINGS: Postoperative changes from right total knee arthroplasty identified. Gas identified within the anterior soft tissues. Alignment appears anatomic. No signs of periprosthetic fracture or dislocation. IMPRESSION: Status post right total knee arthroplasty. Electronically Signed   By: Kerby Moors M.D.   On: 04/05/2022 10:01     Assessment and plan- Patient is a 66 y.o. female here for routine follow-up of pancytopenia  Patient's baseline white cell count ranges between 2-3.  Today it is normal at 6.2.  Her platelet count is normally range around 100 and today it is normal at 241.  I suspect this is reactive secondary to her recent right knee replacement surgery.  Her counts are likely to return back to her baseline when she recovers from her knee replacement surgery.  Overall her counts have remained stable over  the years.  I will plan to repeat CBC with differential in 6 months in 1 year and see her back in 1 year.  We will also check CBC ferritin iron studies B12 and folate and 6 months   Visit Diagnosis 1. Pancytopenia ([place])      Dr. Randa Evens, MD, MPH [facility] [phone] 04/22/2022 5:19 PM

## 2022-10-24 ENCOUNTER — Telehealth: Payer: Self-pay | Admitting: *Deleted

## 2022-10-24 ENCOUNTER — Other Ambulatory Visit: Payer: Medicare Other

## 2022-10-24 NOTE — Telephone Encounter (Signed)
Reviewed the labs from PCP and Dr. Janese Banks said to see her 12/6 and Anderson Malta called the pt and let her know of the appt. Next week and she will get IV iron on that day also. Pt accepted appt

## 2022-10-28 ENCOUNTER — Telehealth: Payer: Self-pay

## 2022-10-28 NOTE — Telephone Encounter (Signed)
error 

## 2022-11-02 ENCOUNTER — Inpatient Hospital Stay: Payer: Medicare Other

## 2022-11-02 ENCOUNTER — Encounter: Payer: Self-pay | Admitting: Oncology

## 2022-11-02 ENCOUNTER — Inpatient Hospital Stay: Payer: Medicare Other | Attending: Oncology | Admitting: Oncology

## 2022-11-02 VITALS — BP 100/68 | HR 83 | Temp 98.2°F | Resp 16 | Wt 153.4 lb

## 2022-11-02 DIAGNOSIS — D61818 Other pancytopenia: Secondary | ICD-10-CM

## 2022-11-02 DIAGNOSIS — D508 Other iron deficiency anemias: Secondary | ICD-10-CM

## 2022-11-02 DIAGNOSIS — D509 Iron deficiency anemia, unspecified: Secondary | ICD-10-CM | POA: Insufficient documentation

## 2022-11-02 DIAGNOSIS — D500 Iron deficiency anemia secondary to blood loss (chronic): Secondary | ICD-10-CM

## 2022-11-02 MED ORDER — SODIUM CHLORIDE 0.9 % IV SOLN
510.0000 mg | INTRAVENOUS | Status: DC
  Administered 2022-11-02: 510 mg via INTRAVENOUS
  Filled 2022-11-02: qty 17

## 2022-11-02 MED ORDER — SODIUM CHLORIDE 0.9 % IV SOLN
Freq: Once | INTRAVENOUS | Status: AC
  Filled 2022-11-02: qty 250

## 2022-11-02 NOTE — Progress Notes (Signed)
Hematology/Oncology Consult note [facility]  Telephone:([phone] [phone]  Patient Care Team: [place] Pink, MD as PCP - General (Family Medicine) Sindy Guadeloupe, MD as Consulting Physician (Hematology and Oncology)   Name of the patient: Tammy Arias  [id number]  05-Jun-1956   Date of visit: 11/02/22  Diagnosis- 1.  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  Iron deficiency anemia  Chief complaint/ Reason for visit-routine follow-up of iron deficiency anemia  Heme/Onc history: patient is a 66 year old Caucasian female with a past medical history significant for congestive heart failure with an EF of 35%, history of A. 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 EGD showed salmon-colored mucosa suspicious for short segment Barrett's.  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  Nonbleeding erosive gastropathy.  Normal examined duodenum.  No specimens collected.Colonoscopy showed 7 nonbleeding colonic angiectasia treated with APC.  Nonbleeding internal hemorrhoids.  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  On a couple of occasions her white count has been normal at around 4.1.  Her white count was noted to be 2.4 on her recent blood work and prior to that a month ago it was 2.7.  Differential mainly shows lymphopenia with a neutrophil count that fluctuates between 1.8-3.7.  She is also developed gradual onset thrombocytopenia over the last 2 years and her platelet counts have gradually drifted down from 1 45-1 07 in March 2022.  More recently in April she was  noted to have a platelet count of 98.   Bone marrow biopsy showed slightly hypercellular marrow with trilineage-hematopoiesis with abundant megakaryocytes with normal morphology.  Generally mild nonspecific changes involving the myeloid cell lines with no increase in blasts.  Cytogenetics and FISH studies currently pending.  Myeloma panel, B12, folate unremarkable.  ANA comprehensive panel negative.    Interval history-patient reports ongoing fatigue.  She is craving ice.  She has noticed bright red blood per rectum once.  Denies any melena.  Denies any consistent use of NSAIDs.  ECOG PS- 1 Pain scale- 0   Review of systems- Review of Systems  Constitutional:  Positive for malaise/fatigue. Negative for chills, fever and weight loss.  HENT:  Negative for congestion, ear discharge and nosebleeds.   Eyes:  Negative for blurred vision.  Respiratory:  Negative for cough, hemoptysis, sputum production, shortness of breath and wheezing.   Cardiovascular:  Negative for chest pain, palpitations, orthopnea and claudication.  Gastrointestinal:  Negative for abdominal pain, blood in stool, constipation, diarrhea, heartburn, melena, nausea and vomiting.  Genitourinary:  Negative for dysuria, flank pain, frequency, hematuria and urgency.  Musculoskeletal:  Negative for back pain, joint pain and myalgias.  Skin:  Negative for rash.  Neurological:  Negative for dizziness, tingling, focal weakness, seizures, weakness and headaches.  Endo/Heme/Allergies:  Does not bruise/bleed easily.  Psychiatric/Behavioral:  Negative for depression and suicidal ideas. The patient does not have insomnia.       Allergies  Allergen Reactions   Vioxx [Rofecoxib] Swelling     Past Medical History:  Diagnosis Date   AC (acromioclavicular) joint bone spurs, right    Anemia  Arthritis    Atrial fibrillation (HCC)    a.) CHA2DS2-VASc = 7 (age, sex, HFrEF, HTN, TIA x 2, T2DM). b.) rate/rhythm controlled on oral  carvedilol; chronically anticoagulated with warfarin.   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 b.) R/LHC 03/29/2017: small RCA with occluded SVG; no significant Dz. Chronically occluded LAD with patent SVG to D1/LAD. Insignificant Dz in LCx. LM normal.   Cortical cataract    DOE (dyspnea on exertion)    DOE (dyspnea on exertion)    GERD (gastroesophageal reflux disease)    HFrEF (heart failure with reduced ejection fraction) ([place])    a.) TTE 10/27/2014: EF 40%; glob HK; mild BAE; triv AR, mild MR/PR, mod TR.  b.) TTE 03/15/2017: EF 30%; glob HK; mod BAE; mod pHTN (RVSP 54.8 mmHg); mild PR, mod MR; sev TR.  c.) R/LHC 03/29/2017: EF 30-35%. d.)TTE 10/25/2018: EF 35%; mod BAE; mod BVE; glob HK; triv PR, mod MR, sev TR; RVSP 57.7 mmHg; G2DD.   History of shingles 2004   Hyperlipidemia    Hypertension    Hypothyroidism    IBS (irritable bowel syndrome)    Ischemic cardiomyopathy    a.) TTE 10/27/2014: EF 40%. b.) TTE 03/15/2017: EF 30%. c.) R/LHC 04/01/2017: EF 30-35%. d.) TTE 10/25/2018: EF 35%   Lumbar scoliosis    Lumbar spinal stenosis    Migraines    Osteoporosis    Pulmonary HTN ([place])    a.) TTE 03/15/2017: EF 30-35%; RVSP 54.8 mmHg. 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   T2DM (type 2 diabetes mellitus) ([place])    TIA (transient ischemic attack) 05/29/2016   Vitamin B 12 deficiency      Past Surgical History:  Procedure Laterality Date   CARDIAC CATHETERIZATION     CATARACT EXTRACTION W/ INTRAOCULAR LENS IMPLANT Bilateral    Cataract Extraction with IOL   COLONOSCOPY N/A 12/19/2018   Procedure: COLONOSCOPY;  Surgeon: Virgel Manifold, MD;  Location: [facility] ENDOSCOPY;  Service: Endoscopy;  Laterality: N/A;   COLONOSCOPY WITH PROPOFOL N/A 06/17/2020    Procedure: COLONOSCOPY WITH PROPOFOL;  Surgeon: Virgel Manifold, MD;  Location: [facility] ENDOSCOPY;  Service: Endoscopy;  Laterality: N/A;   COR TRIATRIATUM REPAIR N/A 01/28/2004   CORONARY ARTERY BYPASS GRAFT N/A 01/28/2004   3v CABG   ESOPHAGOGASTRODUODENOSCOPY N/A 12/19/2018   Procedure: ESOPHAGOGASTRODUODENOSCOPY (EGD);  Surgeon: Virgel Manifold, MD;  Location[facility] END[facility]COPY;  Service: Endoscopy;  Laterality: N/A;   ESOPHAGOGASTRODUODENOSCOPY (EGD) WITH PROPOFOL N/A 06/17/2020   Procedure: ESOPHAGOGASTRODUODENOSCOPY (EGD) WITH PROPOFOL;  Surgeon: Virgel Manifold, MD;  Location: [facility] ENDOSCOPY;  Service: Endoscopy;  Laterality: N/A;   RIGHT/LEFT HEART CATH AND CORONARY ANGIOGRAPHY Bilateral 03/29/2017   Procedure: Right/Left Heart Cath and Coronary Angiography;  Surgeon: Teodoro Spray, MD;  Location: [facility];  Service: Cardiovascular;  Laterality: Bilateral;   TOTAL KNEE ARTHROPLASTY Right 04/05/2022   Procedure: TOTAL KNEE ARTHROPLASTY;  Surgeon: Hessie Knows, MD;  Location: [facility];  Service: Orthopedics;  Laterality: Right;   TUBAL LIGATION     VENTRAL HERNIA REPAIR N/A 04/21/2017   Procedure: HERNIA REPAIR VENTRAL ADULT;  Surgeon: Leonie Green, MD;  Location: [facility] ORS;  Service: General;  Laterality:  N/A;    Social History   Socioeconomic History   Marital status: Widowed    Spouse name: Not on file   Number of children: Not on file   Years of education: Not on file   Highest education level: Not on file  Occupational History   Occupation: certified nursing assistant  Tobacco Use   Smoking status: Former    Types: Cigarettes    Quit date: 03/30/1999    Years since quitting: 23.6   Smokeless tobacco: Never  Vaping Use   Vaping Use: Never used  Substance and Sexual Activity   Alcohol use: No   Drug use: No   Sexual activity: Not Currently  Other Topics Concern   Not on file  Social History Narrative   Not on file   Social Determinants  of Health   Financial Resource Strain: Not on file  Food Insecurity: Not on file  Transportation Needs: Not on file  Physical Activity: Not on file  Stress: Not on file  Social Connections: Not on file  Intimate Partner Violence: Not on file    Family History  Problem Relation Age of Onset   Cancer Sister        lung   Cancer Sister        cervical   Valvular heart disease Mother    Diabetes Father    Breast cancer Neg Hx      Current Outpatient Medications:    albuterol (PROVENTIL HFA;VENTOLIN HFA) 108 (90 Base) MCG/ACT inhaler, Inhale 2 puffs into the lungs every 6 (six) hours as needed for wheezing or shortness of breath., Disp: , Rfl:    aspirin EC 81 MG tablet, Take 81 mg by mouth daily., Disp: , Rfl:    carvedilol (COREG) 3.125 MG tablet, Take 3.125 mg by mouth 2 (two) times daily with a meal., Disp: , Rfl:    diclofenac Sodium (VOLTAREN) 1 % GEL, Apply 1 application. topically 4 (four) times daily as needed (pain)., Disp: , Rfl:    ENTRESTO 24-26 MG, Take 1 tablet by mouth 2 (two) times daily., Disp: , Rfl:    furosemide (LASIX) 40 MG tablet, Take 80 mg by mouth daily. , Disp: , Rfl:    latanoprost (XALATAN) 0.005 % ophthalmic solution, Place 1 drop into both eyes at bedtime., Disp: , Rfl:    levothyroxine (SYNTHROID) 50 MCG tablet, Take 50 mcg by mouth daily before breakfast., Disp: , Rfl:    lovastatin (MEVACOR) 10 MG tablet, Take 10 mg by mouth at bedtime., Disp: , Rfl:    metFORMIN (GLUCOPHAGE) 1000 MG tablet, Take 1,000 mg by mouth 2 (two) times daily with a meal., Disp: , Rfl:    pantoprazole (PROTONIX) 40 MG tablet, Take 40 mg by mouth daily., Disp: , Rfl:    spironolactone (ALDACTONE) 25 MG tablet, Take 25 mg by mouth daily., Disp: , Rfl:    vitamin B-12 (CYANOCOBALAMIN) 1000 MCG tablet, Take 1,000 mcg by mouth daily., Disp: , Rfl:    warfarin (COUMADIN) 3 MG tablet, Take 3-4.5 mg by mouth See admin instructions. 4.5 mg on Monday, 3 mg all other days, Disp: , Rfl:     docusate sodium (COLACE) 100 MG capsule, Take 1 capsule (100 mg total) by mouth 2 (two) times daily. (Patient not taking: Reported on 11/02/2022), Disp: 10 capsule, Rfl: 0   fluticasone (FLONASE) 50 MCG/ACT nasal spray, Place 1 spray into both nostrils at bedtime. (Patient not taking: Reported on 11/02/2022), Disp: , Rfl:  HYDROcodone-acetaminophen (NORCO) 7.5-325 MG tablet, Take 1-2 tablets by mouth every 4 (four) hours as needed for severe pain (pain score 7-10). (Patient not taking: Reported on 11/02/2022), Disp: 30 tablet, Rfl: 0   methocarbamol (ROBAXIN) 500 MG tablet, Take 1 tablet (500 mg total) by mouth every 6 (six) hours as needed for muscle spasms. (Patient not taking: Reported on 11/02/2022), Disp: 30 tablet, Rfl: 0   ondansetron (ZOFRAN) 4 MG tablet, Take 1 tablet (4 mg total) by mouth every 6 (six) hours as needed for nausea. (Patient not taking: Reported on 04/22/2022), Disp: 20 tablet, Rfl: 0   polyethylene glycol (MIRALAX / GLYCOLAX) 17 g packet, Take 17 g by mouth daily as needed for mild constipation. (Patient not taking: Reported on 11/02/2022), Disp: 14 each, Rfl: 0   traMADol (ULTRAM) 50 MG tablet, Take 1 tablet (50 mg total) by mouth every 6 (six) hours as needed. (Patient not taking: Reported on 11/02/2022), Disp: 30 tablet, Rfl: 0 No current facility-administered medications for this visit.  Facility-Administered Medications Ordered in Other Visits:    ferumoxytol (FERAHEME) 510 mg in sodium chloride 0.9 % 100 mL IVPB, 510 mg, Intravenous, Weekly, Sindy Guadeloupe, MD, Last Rate: 468 mL/hr at 11/02/22 1509, 510 mg at 11/02/22 1509  Physical exam:  Vitals:   11/02/22 1346  BP: 100/68  Pulse: 83  Resp: 16  Temp: 98.2 F (36.8 C)  SpO2: 98%  Weight: 153 lb 6.4 oz (69.6 kg)   Physical Exam Constitutional:      General: She is not in acute distress. Cardiovascular:     Rate and Rhythm: Normal rate and regular rhythm.     Heart sounds: Normal heart sounds.  Pulmonary:      Effort: Pulmonary effort is normal.     Breath sounds: Normal breath sounds.  Abdominal:     General: Bowel sounds are normal.     Palpations: Abdomen is soft.  Skin:    General: Skin is warm and dry.  Neurological:     Mental Status: She is alert and oriented to person, place, and time.         Latest Ref Rng & Units 04/22/2022   10:02 AM  CMP  Glucose 70 - 99 mg/dL 123   BUN 8 - 23 mg/dL 22   Creatinine 0.44 - 1.00 mg/dL 1.20   Sodium 135 - 145 mmol/L 134   Potassium 3.5 - 5.1 mmol/L 4.8   Chloride 98 - 111 mmol/L 96   CO2 22 - 32 mmol/L 31   Calcium 8.9 - 10.3 mg/dL 9.2   Total Protein 6.5 - 8.1 g/dL 7.6   Total Bilirubin 0.3 - 1.2 mg/dL 0.9   Alkaline Phos 38 - 126 U/L 141   AST 15 - 41 U/L 33   ALT 0 - 44 U/L 22       Latest Ref Rng & Units 04/22/2022   10:02 AM  CBC  WBC 4.0 - 10.5 K/uL 6.2   Hemoglobin 12.0 - 15.0 g/dL 11.2   Hematocrit 36.0 - 46.0 % 34.5   Platelets 150 - 400 K/uL 241      Assessment and plan- Patient is a 66 y.o. female here for routine follow-up of pancytopenia and iron deficiency anemia  Pancytopenia: Overall stable as compared to her prior counts Iron deficiency anemia: She will be seeing [facility] soon to discuss endoscopy evaluation.  She will proceed with 2 doses of Feraheme with the first dose being given today.  Discussed risks  and benefits of Feraheme including all but not limited to possible risk of infusion and anaphylactic reaction.  Patient understands and agrees to proceed as planned.  I will check CBC ferritin and iron studiesB12 and folate in about 2 and half months and see her in person thereafter   Visit Diagnosis 1. Pancytopenia ([place])   2. Other iron deficiency anemia      Dr. Randa Evens, MD, MPH [facility] [phone] 11/02/2022 3:42 PM

## 2022-11-11 ENCOUNTER — Inpatient Hospital Stay: Payer: Medicare Other

## 2022-11-11 VITALS — BP 99/64 | HR 77 | Temp 96.0°F | Resp 17

## 2022-11-11 DIAGNOSIS — D509 Iron deficiency anemia, unspecified: Secondary | ICD-10-CM | POA: Diagnosis not present

## 2022-11-11 DIAGNOSIS — D500 Iron deficiency anemia secondary to blood loss (chronic): Secondary | ICD-10-CM

## 2022-11-11 MED ORDER — SODIUM CHLORIDE 0.9 % IV SOLN
Freq: Once | INTRAVENOUS | Status: AC
  Filled 2022-11-11: qty 250

## 2022-11-11 MED ORDER — SODIUM CHLORIDE 0.9 % IV SOLN
510.0000 mg | INTRAVENOUS | Status: DC
  Administered 2022-11-11: 510 mg via INTRAVENOUS
  Filled 2022-11-11: qty 510

## 2022-11-14 ENCOUNTER — Other Ambulatory Visit: Payer: Self-pay | Admitting: Family Medicine

## 2022-11-14 DIAGNOSIS — Z1231 Encounter for screening mammogram for malignant neoplasm of breast: Secondary | ICD-10-CM

## 2022-12-23 ENCOUNTER — Ambulatory Visit
Admission: RE | Admit: 2022-12-23 | Discharge: 2022-12-23 | Disposition: A | Discharge status: Home / Self Care | Payer: Medicare Other | Source: Ambulatory Visit | Attending: Family Medicine | Admitting: Family Medicine

## 2022-12-23 DIAGNOSIS — Z1231 Encounter for screening mammogram for malignant neoplasm of breast: Secondary | ICD-10-CM | POA: Diagnosis present

## 2022-12-30 ENCOUNTER — Other Ambulatory Visit: Payer: Self-pay | Admitting: Gastroenterology

## 2022-12-30 DIAGNOSIS — D509 Iron deficiency anemia, unspecified: Secondary | ICD-10-CM

## 2022-12-30 DIAGNOSIS — K7460 Unspecified cirrhosis of liver: Secondary | ICD-10-CM

## 2023-01-05 ENCOUNTER — Ambulatory Visit
Admission: RE | Admit: 2023-01-05 | Discharge: 2023-01-05 | Disposition: A | Discharge status: Home / Self Care | Payer: Medicare Other | Source: Ambulatory Visit | Attending: Gastroenterology | Admitting: Gastroenterology

## 2023-01-05 DIAGNOSIS — K7460 Unspecified cirrhosis of liver: Secondary | ICD-10-CM | POA: Diagnosis present

## 2023-01-05 DIAGNOSIS — D509 Iron deficiency anemia, unspecified: Secondary | ICD-10-CM | POA: Insufficient documentation

## 2023-01-06 ENCOUNTER — Other Ambulatory Visit
Admission: RE | Admit: 2023-01-06 | Discharge: 2023-01-06 | Disposition: A | Discharge status: Home / Self Care | Payer: Medicare Other | Source: Ambulatory Visit | Attending: Internal Medicine | Admitting: Internal Medicine

## 2023-01-06 DIAGNOSIS — I480 Paroxysmal atrial fibrillation: Secondary | ICD-10-CM | POA: Diagnosis present

## 2023-01-06 DIAGNOSIS — I25110 Atherosclerotic heart disease of native coronary artery with unstable angina pectoris: Secondary | ICD-10-CM | POA: Diagnosis present

## 2023-01-06 LAB — BRAIN NATRIURETIC PEPTIDE: B Natriuretic Peptide: 425.5 pg/mL — ABNORMAL HIGH (ref 0.0–100.0)

## 2023-01-19 ENCOUNTER — Other Ambulatory Visit: Payer: Self-pay | Admitting: Oncology

## 2023-01-19 MED FILL — Ferumoxytol Inj 510 MG/17ML (30 MG/ML) (Elemental Fe): INTRAVENOUS | Qty: 17 | Status: AC

## 2023-01-20 ENCOUNTER — Inpatient Hospital Stay: Payer: Medicare Other

## 2023-01-20 ENCOUNTER — Encounter: Payer: Self-pay | Admitting: Oncology

## 2023-01-20 ENCOUNTER — Inpatient Hospital Stay: Payer: Medicare Other | Attending: Oncology | Admitting: Oncology

## 2023-01-20 VITALS — BP 102/59 | HR 82 | Temp 96.9°F | Ht 62.0 in | Wt 154.0 lb

## 2023-01-20 DIAGNOSIS — D708 Other neutropenia: Secondary | ICD-10-CM | POA: Diagnosis not present

## 2023-01-20 DIAGNOSIS — D72819 Decreased white blood cell count, unspecified: Secondary | ICD-10-CM | POA: Insufficient documentation

## 2023-01-20 DIAGNOSIS — Z87891 Personal history of nicotine dependence: Secondary | ICD-10-CM | POA: Diagnosis not present

## 2023-01-20 DIAGNOSIS — D500 Iron deficiency anemia secondary to blood loss (chronic): Secondary | ICD-10-CM

## 2023-01-20 DIAGNOSIS — D509 Iron deficiency anemia, unspecified: Secondary | ICD-10-CM | POA: Insufficient documentation

## 2023-01-20 DIAGNOSIS — D61818 Other pancytopenia: Secondary | ICD-10-CM

## 2023-01-20 DIAGNOSIS — D696 Thrombocytopenia, unspecified: Secondary | ICD-10-CM | POA: Diagnosis not present

## 2023-01-20 LAB — CBC WITH DIFFERENTIAL/PLATELET
Abs Immature Granulocytes: 0 10*3/uL (ref 0.00–0.07)
Basophils Absolute: 0 10*3/uL (ref 0.0–0.1)
Basophils Relative: 1 %
Eosinophils Absolute: 0.2 10*3/uL (ref 0.0–0.5)
Eosinophils Relative: 8 %
HCT: 39.6 % (ref 36.0–46.0)
Hemoglobin: 12.9 g/dL (ref 12.0–15.0)
Immature Granulocytes: 0 %
Lymphocytes Relative: 15 %
Lymphs Abs: 0.4 10*3/uL — ABNORMAL LOW (ref 0.7–4.0)
MCH: 32.8 pg (ref 26.0–34.0)
MCHC: 32.6 g/dL (ref 30.0–36.0)
MCV: 100.8 fL — ABNORMAL HIGH (ref 80.0–100.0)
Monocytes Absolute: 0.3 10*3/uL (ref 0.1–1.0)
Monocytes Relative: 12 %
Neutro Abs: 1.6 10*3/uL — ABNORMAL LOW (ref 1.7–7.7)
Neutrophils Relative %: 64 %
Platelets: 90 10*3/uL — ABNORMAL LOW (ref 150–400)
RBC: 3.93 MIL/uL (ref 3.87–5.11)
RDW: 15.3 % (ref 11.5–15.5)
WBC: 2.6 10*3/uL — ABNORMAL LOW (ref 4.0–10.5)
nRBC: 0 % (ref 0.0–0.2)

## 2023-01-20 LAB — IRON AND TIBC
Iron: 143 ug/dL (ref 28–170)
Saturation Ratios: 34 % — ABNORMAL HIGH (ref 10.4–31.8)
TIBC: 424 ug/dL (ref 250–450)
UIBC: 281 ug/dL

## 2023-01-20 LAB — VITAMIN B12: Vitamin B-12: 843 pg/mL (ref 180–914)

## 2023-01-20 LAB — FOLATE: Folate: 15.7 ng/mL (ref >5.9)

## 2023-01-20 LAB — FERRITIN: Ferritin: 170 ng/mL (ref 11–307)

## 2023-01-20 NOTE — Progress Notes (Signed)
No concerns for the provider.

## 2023-01-20 NOTE — Progress Notes (Signed)
Per MD no Venofer today

## 2023-01-22 ENCOUNTER — Encounter: Payer: Self-pay | Admitting: Oncology

## 2023-01-22 NOTE — Progress Notes (Signed)
Hematology/Oncology Consult note [facility]  [phone] [phone]  Patient Care Team: [place] Pink, MD as PCP - General (Family Medicine) Sindy Guadeloupe, MD as Consulting Physician (Hematology and Oncology)   Name of the patient: Tammy Arias  AT:[id number]  [birth year]   Date of visit: 01/22/23  Diagnosis- 1.  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  Iron deficiency anemia    Chief complaint/ Reason for visit-follow-up of leukopenia and iron deficiency anemia  Heme/Onc history: patient is a 67 year old Caucasian female with a past medical history significant for congestive heart failure with an EF of 35%, history of A. 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 EGD showed salmon-colored mucosa suspicious for short segment Barrett's.  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  Nonbleeding erosive gastropathy.  Normal examined duodenum.  No specimens collected.Colonoscopy showed 7 nonbleeding colonic angiectasia treated with APC.  Nonbleeding internal hemorrhoids.  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  On a couple of occasions her white count has been normal at around 4.1.  Her white count was noted to be 2.4 on her recent blood work and prior to that a month ago it was 2.7.  Differential mainly shows lymphopenia with a neutrophil count that fluctuates between 1.8-3.7.  She is also developed gradual onset thrombocytopenia over the last 2 years and her platelet counts have gradually drifted down from 1 45-1 07 in March 2022.  More recently in  April she was noted to have a platelet count of 98.   Bone marrow biopsy showed slightly hypercellular marrow with trilineage-hematopoiesis with abundant megakaryocytes with normal morphology.  Generally mild nonspecific changes involving the myeloid cell lines with no increase in blasts.  Cytogenetics and FISH studies currently pending.  Myeloma panel, B12, folate unremarkable.  ANA comprehensive panel negative.    Interval history-patient is doing well.  No recent hospitalizations.  Denies any blood loss in stool or urine.  ECOG PS- 1 Pain scale- 2 Opioid associated constipation- no  Review of systems- Review of Systems  Constitutional:  Positive for malaise/fatigue. Negative for chills, fever and weight loss.  HENT:  Negative for congestion, ear discharge and nosebleeds.   Eyes:  Negative for blurred vision.  Respiratory:  Negative for cough, hemoptysis, sputum production, shortness of breath and wheezing.   Cardiovascular:  Negative for chest pain, palpitations, orthopnea and claudication.  Gastrointestinal:  Negative for abdominal pain, blood in stool, constipation, diarrhea, heartburn, melena, nausea and vomiting.  Genitourinary:  Negative for dysuria, flank pain, frequency, hematuria and urgency.  Musculoskeletal:  Negative for back pain, joint pain and myalgias.  Skin:  Negative for rash.  Neurological:  Negative for dizziness, tingling, focal weakness, seizures, weakness and headaches.  Endo/Heme/Allergies:  Does not bruise/bleed easily.  Psychiatric/Behavioral:  Negative for depression and suicidal ideas. The patient does not have insomnia.       Allergies  Allergen Reactions   Vioxx [Rofecoxib] Swelling     Past Medical History:  Diagnosis Date   AC (acromioclavicular) joint bone spurs, right    Anemia    Arthritis    Atrial fibrillation ([place])  a.) CHA2DS2-VASc = 7 (age, sex, HFrEF, HTN, TIA x 2, T2DM). b.) rate/rhythm controlled on oral carvedilol; chronically  anticoagulated with warfarin.   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 b.) R/LHC 03/29/2017: small RCA with occluded SVG; no significant Dz. Chronically occluded LAD with patent SVG to D1/LAD. Insignificant Dz in LCx. LM normal.   Cortical cataract    DOE (dyspnea on exertion)    DOE (dyspnea on exertion)    GERD (gastroesophageal reflux disease)    HFrEF (heart failure with reduced ejection fraction) ([place])    a.) TTE 10/27/2014: EF 40%; glob HK; mild BAE; triv AR, mild MR/PR, mod TR.  b.) TTE 03/15/2017: EF 30%; glob HK; mod BAE; mod pHTN (RVSP 54.8 mmHg); mild PR, mod MR; sev TR.  c.) R/LHC 03/29/2017: EF 30-35%. d.)TTE 10/25/2018: EF 35%; mod BAE; mod BVE; glob HK; triv PR, mod MR, sev TR; RVSP 57.7 mmHg; G2DD.   History of shingles 2004   Hyperlipidemia    Hypertension    Hypothyroidism    IBS (irritable bowel syndrome)    Ischemic cardiomyopathy    a.) TTE 10/27/2014: EF 40%. b.) TTE 03/15/2017: EF 30%. c.) R/LHC 04/01/2017: EF 30-35%. d.) TTE 10/25/2018: EF 35%   Lumbar scoliosis    Lumbar spinal stenosis    Migraines    Osteoporosis    Pulmonary HTN ([place])    a.) TTE 03/15/2017: EF 30-35%; RVSP 54.8 mmHg. 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   T2DM (type 2 diabetes mellitus) ([place])    TIA (transient ischemic attack) 05/29/2016   Vitamin B 12 deficiency      Past Surgical History:  Procedure Laterality Date   CARDIAC CATHETERIZATION     CATARACT EXTRACTION W/ INTRAOCULAR LENS IMPLANT Bilateral    Cataract Extraction with IOL   COLONOSCOPY N/A 12/19/2018   Procedure: COLONOSCOPY;  Surgeon: Virgel Manifold, MD;  Location: [facility] ENDOSCOPY;  Service: Endoscopy;  Laterality: N/A;   COLONOSCOPY WITH PROPOFOL N/A 06/17/2020   Procedure: COLONOSCOPY WITH  PROPOFOL;  Surgeon: Virgel Manifold, MD;  Location: [facility] ENDOSCOPY;  Service: Endoscopy;  Laterality: N/A;   COR TRIATRIATUM REPAIR N/A 01/28/2004   CORONARY ARTERY BYPASS GRAFT N/A 01/28/2004   3v CABG   ESOPHAGOGASTRODUODENOSCOPY N/A 12/19/2018   Procedure: ESOPHAGOGASTRODUODENOSCOPY (EGD);  Surgeon: Virgel Manifold, MD;  Location: [facility] ENDOSCOPY;  Service: Endoscopy;  Laterality: N/A;   ESOPHAGOGASTRODUODENOSCOPY (EGD) WITH PROPOFOL N/A 06/17/2020   Procedure: ESOPHAGOGASTRODUODENOSCOPY (EGD) WITH PROPOFOL;  Surgeon: Virgel Manifold, MD;  Location: [facility] ENDOSCOPY;  Service: Endoscopy;  Laterality: N/A;   RIGHT/LEFT HEART CATH AND CORONARY ANGIOGRAPHY Bilateral 03/29/2017   Procedure: Right/Left Heart Cath and Coronary Angiography;  Surgeon: Teodoro Spray, MD;  Location: [facility];  Service: Cardiovascular;  Laterality: Bilateral;   TOTAL KNEE ARTHROPLASTY Right 04/05/2022   Procedure: TOTAL KNEE ARTHROPLASTY;  Surgeon: Hessie Knows, MD;  Location: [facility] ORS;  Service: Orthopedics;  Laterality: Right;   TUBAL LIGATION     VENTRAL HERNIA REPAIR N/A 04/21/2017   Procedure: HERNIA REPAIR VENTRAL ADULT;  Surgeon: Leonie Green, MD;  Location: [facility] ORS;  Service: General;  Laterality: N/A;    Social History   Socioeconomic History  Marital status: Widowed    Spouse name: Not on file   Number of children: Not on file   Years of education: Not on file   Highest education level: Not on file  Occupational History   Occupation: certified nursing assistant  Tobacco Use   Smoking status: Former    Types: Cigarettes    Quit date: 03/30/1999    Years since quitting: 23.8   Smokeless tobacco: Never  Vaping Use   Vaping Use: Never used  Substance and Sexual Activity   Alcohol use: No   Drug use: No   Sexual activity: Not Currently  Other Topics Concern   Not on file  Social History Narrative   Not on file   Social Determinants of Health   Financial  Resource Strain: Not on file  Food Insecurity: Not on file  Transportation Needs: Not on file  Physical Activity: Not on file  Stress: Not on file  Social Connections: Not on file  Intimate Partner Violence: Not on file    Family History  Problem Relation Age of Onset   Valvular heart disease Mother    Diabetes Father    Cancer Sister        lung   Cancer Sister        cervical   Breast cancer Cousin      Current Outpatient Medications:    albuterol (PROVENTIL HFA;VENTOLIN HFA) 108 (90 Base) MCG/ACT inhaler, Inhale 2 puffs into the lungs every 6 (six) hours as needed for wheezing or shortness of breath., Disp: , Rfl:    aspirin EC 81 MG tablet, Take 81 mg by mouth daily., Disp: , Rfl:    carvedilol (COREG) 6.25 MG tablet, Take by mouth., Disp: , Rfl:    diclofenac Sodium (VOLTAREN) 1 % GEL, Apply 1 application. topically 4 (four) times daily as needed (pain)., Disp: , Rfl:    ELIQUIS 5 MG TABS tablet, Take 5 mg by mouth 2 (two) times daily., Disp: , Rfl:    fluticasone (FLONASE) 50 MCG/ACT nasal spray, Place 1 spray into both nostrils at bedtime., Disp: , Rfl:    furosemide (LASIX) 40 MG tablet, Take 80 mg by mouth daily. , Disp: , Rfl:    latanoprost (XALATAN) 0.005 % ophthalmic solution, Place 1 drop into both eyes at bedtime., Disp: , Rfl:    levothyroxine (SYNTHROID) 50 MCG tablet, Take 50 mcg by mouth daily before breakfast., Disp: , Rfl:    lovastatin (MEVACOR) 10 MG tablet, Take 10 mg by mouth at bedtime., Disp: , Rfl:    metFORMIN (GLUCOPHAGE) 1000 MG tablet, Take 1,000 mg by mouth 2 (two) times daily with a meal., Disp: , Rfl:    ONETOUCH ULTRA test strip, , Disp: , Rfl:    pantoprazole (PROTONIX) 40 MG tablet, Take 40 mg by mouth daily., Disp: , Rfl:    polyethylene glycol-electrolytes (NULYTELY) 420 g solution, Take by mouth., Disp: , Rfl:    sacubitril-valsartan (ENTRESTO) 49-51 MG, Take 1 tablet by mouth 2 (two) times daily., Disp: , Rfl:    spironolactone  (ALDACTONE) 25 MG tablet, Take 25 mg by mouth daily., Disp: , Rfl:    vitamin B-12 (CYANOCOBALAMIN) 1000 MCG tablet, Take 1,000 mcg by mouth daily., Disp: , Rfl:    cyclobenzaprine (FLEXERIL) 5 MG tablet, Take by mouth. (Patient not taking: Reported on 01/20/2023), Disp: , Rfl:    docusate sodium (COLACE) 100 MG capsule, Take 1 capsule (100 mg total) by mouth 2 (two) times daily. (Patient not  taking: Reported on 11/02/2022), Disp: 10 capsule, Rfl: 0   ENTRESTO 24-26 MG, Take 1 tablet by mouth 2 (two) times daily. (Patient not taking: Reported on 01/20/2023), Disp: , Rfl:    HYDROcodone-acetaminophen (NORCO) 7.5-325 MG tablet, Take 1-2 tablets by mouth every 4 (four) hours as needed for severe pain (pain score 7-10). (Patient not taking: Reported on 11/02/2022), Disp: 30 tablet, Rfl: 0   methocarbamol (ROBAXIN) 500 MG tablet, Take 1 tablet (500 mg total) by mouth every 6 (six) hours as needed for muscle spasms. (Patient not taking: Reported on 11/02/2022), Disp: 30 tablet, Rfl: 0   ondansetron (ZOFRAN) 4 MG tablet, Take 1 tablet (4 mg total) by mouth every 6 (six) hours as needed for nausea. (Patient not taking: Reported on 04/22/2022), Disp: 20 tablet, Rfl: 0   polyethylene glycol (MIRALAX / GLYCOLAX) 17 g packet, Take 17 g by mouth daily as needed for mild constipation. (Patient not taking: Reported on 11/02/2022), Disp: 14 each, Rfl: 0   predniSONE (DELTASONE) 10 MG tablet, Take by mouth. (Patient not taking: Reported on 01/20/2023), Disp: , Rfl:    traMADol (ULTRAM) 50 MG tablet, Take 1 tablet (50 mg total) by mouth every 6 (six) hours as needed. (Patient not taking: Reported on 01/20/2023), Disp: 30 tablet, Rfl: 0   warfarin (COUMADIN) 3 MG tablet, Take 3-4.5 mg by mouth See admin instructions. 4.5 mg on Monday, 3 mg all other days (Patient not taking: Reported on 01/20/2023), Disp: , Rfl:   Physical exam:  Vitals:   01/20/23 1351  BP: (!) 102/59  Pulse: 82  Temp: (!) 96.9 F (36.1 C)  TempSrc:  Tympanic  SpO2: 100%  Weight: 154 lb (69.9 kg)  Height: '5\' 2"'$  (1.575 m)   Physical Exam Cardiovascular:     Rate and Rhythm: Normal rate and regular rhythm.     Heart sounds: Normal heart sounds.  Pulmonary:     Effort: Pulmonary effort is normal.  Musculoskeletal:     Cervical back: Normal range of motion.  Skin:    General: Skin is warm and dry.  Neurological:     Mental Status: She is alert and oriented to person, place, and time.         Latest Ref Rng & Units 04/22/2022   10:02 AM  CMP  Glucose 70 - 99 mg/dL 123   BUN 8 - 23 mg/dL 22   Creatinine 0.44 - 1.00 mg/dL 1.20   Sodium 135 - 145 mmol/L 134   Potassium 3.5 - 5.1 mmol/L 4.8   Chloride 98 - 111 mmol/L 96   CO2 22 - 32 mmol/L 31   Calcium 8.9 - 10.3 mg/dL 9.2   Total Protein 6.5 - 8.1 g/dL 7.6   Total Bilirubin 0.3 - 1.2 mg/dL 0.9   Alkaline Phos 38 - 126 U/L 141   AST 15 - 41 U/L 33   ALT 0 - 44 U/L 22       Latest Ref Rng & Units 01/20/2023    1:29 PM  CBC  WBC 4.0 - 10.5 K/uL 2.6   Hemoglobin 12.0 - 15.0 g/dL 12.9   Hematocrit 36.0 - 46.0 % 39.6   Platelets 150 - 400 K/uL 90     No images are attached to the encounter.  US Abdomen Complete  Result Date: 01/05/2023 CLINICAL DATA:  Cirrhosis. EXAM: ABDOMEN ULTRASOUND COMPLETE COMPARISON:  Abdominal ultrasound dated 05/27/2020 and CT abdomen pelvis dated 12/17/2018. FINDINGS: Gallbladder: Multiple gallstones are seen, measuring up to 10  mm in size. No gallbladder wall thickening visualized. No sonographic Murphy sign noted by sonographer. Common bile duct: Diameter: 5 mm Liver: No focal lesion identified. The liver has a nodular surface contour, consistent with cirrhosis. Within normal limits in parenchymal echogenicity. Portal vein is patent on color Doppler imaging with normal direction of blood flow towards the liver. IVC: No abnormality visualized. Pancreas: Visualized portion unremarkable. Spleen: Size and appearance within normal limits. Right Kidney:  Length: 10.4 cm. Echogenicity within normal limits. No mass or hydronephrosis visualized. Left Kidney: Length: 8.7 cm. Echogenicity within normal limits. No mass or hydronephrosis visualized. Abdominal aorta: No aneurysm visualized. Other findings: None. IMPRESSION: 1. Cirrhosis. No focal liver lesions. 2. Cholelithiasis. Electronically Signed   By: Zerita Boers M.D.   On: 01/05/2023 14:46   MM 3D SCREEN BREAST BILATERAL  Result Date: 12/26/2022 CLINICAL DATA:  Screening. EXAM: DIGITAL SCREENING BILATERAL MAMMOGRAM WITH TOMOSYNTHESIS AND CAD TECHNIQUE: Bilateral screening digital craniocaudal and mediolateral oblique mammograms were obtained. Bilateral screening digital breast tomosynthesis was performed. The images were evaluated with computer-aided detection. COMPARISON:  Previous exam(s). ACR Breast Density Category a: The breast tissue is almost entirely fatty. FINDINGS: There are no findings suspicious for malignancy. IMPRESSION: No mammographic evidence of malignancy. A result letter of this screening mammogram will be mailed directly to the patient. RECOMMENDATION: Screening mammogram in one year. (Code:SM-B-01Y) BI-RADS CATEGORY  1: Negative. Electronically Signed   By: Nolon Nations M.D.   On: 12/26/2022 16:25     Assessment and plan- Patient is a 67 y.o. female with a lot of leukopenia and iron deficiency anemia  Leukopenia/neutropenia: This has been ongoing at least since 2018.  Counts wax and wane with no consistent downward trend in her counts.  She also has thrombocytopenia since 2020 with platelet counts fluctuating between 90s to 100s.  She may have a component of MDS but presently her cytopenias are nonsevere.  She does not require a bone marrow biopsy at this time.  History of iron deficiency anemia: Patient last received IV iron in December 2023.  Presently her hemoglobin is improved to 12.9.  Iron studies are within normal limits.  She does not require any IV iron at this time.   B12 and folate are also normal.  Repeat CBC with differential ferritin and iron studies in 3 and 6 months and I will see her back in 6 months     Visit Diagnosis 1. Iron deficiency anemia due to chronic blood loss   2. Other neutropenia ([place])      Dr. Randa Evens, MD, MPH [facility] [id number] 01/22/2023 10:24 AM

## 2023-03-29 NOTE — H&P (Signed)
Pre-Procedure H&P   Patient ID: Tammy Arias is a 67 y.o. female.  Gastroenterology Provider: Jaynie Collins, DO  Referring Provider: Tawni Pummel, PA PCP: Jerl Mina, MD  Date: 03/30/2023  HPI Ms. Tammy Arias is a 67 y.o. female who presents today for Esophagogastroduodenoscopy and Colonoscopy for Iron deficiency anemia, GERD, bright red blood per rectum .  Patient with history of HFrEF EF 35%, ICM, CAD, A-fib on Eliquis, CAD status post CABG, cirrhosis who is undergoing upper and lower endoscopy for iron deficiency anemia that has responded to iron infusions. She has a history of AVMs noted on EGD in 2020.  In 2021 she underwent colonoscopy and EGD demonstrating 7 colon polyps and irregular Z-line and erosive gastritis respectively.  Most recent hemoglobin 13 MCV 100.8 platelets 90,000 creatinine 1.2.  Iron saturation 34% ferritin 170.  She is a previous 1/2 pack/day smoking.  No family history of colon cancer or colon polyps  Eliquis held for 2 days prior to procedure (last dosage Monday evening)  Denies any reflux symptoms.  She does have epigastric discomfort but no nausea or vomiting.  Bowel movements daily without melena or hematochezia although she notes some bright red blood per rectum when she wipes.   Past Medical History:  Diagnosis Date   AC (acromioclavicular) joint bone spurs, right    Anemia    Arthritis    Atrial fibrillation (HCC)    a.) CHA2DS2-VASc = 7 (age, sex, HFrEF, HTN, TIA x 2, T2DM). b.) rate/rhythm controlled on oral carvedilol; chronically anticoagulated with warfarin.   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 b.) R/LHC 03/29/2017: small RCA with occluded SVG; no significant Dz. Chronically occluded LAD with patent SVG to D1/LAD. Insignificant Dz in LCx. LM normal.    Cortical cataract    DOE (dyspnea on exertion)    DOE (dyspnea on exertion)    GERD (gastroesophageal reflux disease)    HFrEF (heart failure with reduced ejection fraction) (HCC)    a.) TTE 10/27/2014: EF 40%; glob HK; mild BAE; triv AR, mild MR/PR, mod TR.  b.) TTE 03/15/2017: EF 30%; glob HK; mod BAE; mod pHTN (RVSP 54.8 mmHg); mild PR, mod MR; sev TR.  c.) R/LHC 03/29/2017: EF 30-35%. d.)TTE 10/25/2018: EF 35%; mod BAE; mod BVE; glob HK; triv PR, mod MR, sev TR; RVSP 57.7 mmHg; G2DD.   History of shingles 2004   Hyperlipidemia    Hypertension    Hypothyroidism    IBS (irritable bowel syndrome)    Ischemic cardiomyopathy    a.) TTE 10/27/2014: EF 40%. b.) TTE 03/15/2017: EF 30%. c.) R/LHC 04/01/2017: EF 30-35%. d.) TTE 10/25/2018: EF 35%   Lumbar scoliosis    Lumbar spinal stenosis    Migraines    Osteoporosis    Pulmonary HTN (HCC)    a.) TTE 03/15/2017: EF 30-35%; RVSP 54.8 mmHg. 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   Sleep apnea    T2DM (type 2 diabetes mellitus) (HCC)    TIA (transient ischemic attack) 05/29/2016   Vitamin B 12 deficiency     Past Surgical History:  Procedure Laterality Date   CARDIAC CATHETERIZATION  CATARACT EXTRACTION W/ INTRAOCULAR LENS IMPLANT Bilateral    Cataract Extraction with IOL   COLONOSCOPY N/A 12/19/2018   Procedure: COLONOSCOPY;  Surgeon: Pasty Spillers, MD;  Location: [facility] ENDOS[facility];  Service: Endoscopy;  Laterality: N/A;   COLONOSCOPY WITH PROPOFOL N/A 06/17/2020   Procedure: COLONOSCOPY WITH PROPOFOL;  Surgeon: Pasty Spillers, MD;  Location: [facility] ENDOSCOPY;  Service: Endoscopy;  Laterality: N/A;   COR TRIATRIATUM REPAIR N/A 01/28/2004   CORONARY ARTERY BYPASS GRAFT N/A 01/28/2004   3v CABG   ESOPHAGOGASTRODUODENOSCOPY N/A 12/19/2018   Procedure: ESOPHAGOGASTRODUODENOSCOPY (EGD);  Surgeon: Pasty Spillers, MD;   Location: [facility] ENDOSCOPY;  Service: Endoscopy;  Laterality: N/A;   ESOPHAGOGASTRODUODENOSCOPY (EGD) WITH PROPOFOL N/A 06/17/2020   Procedure: ESOPHAGOGASTRODUODENOSCOPY (EGD) WITH PROPOFOL;  Surgeon: Pasty Spillers, MD;  Location: [facility] ENDOSCOPY;  Service: Endoscopy;  Laterality: N/A;   RIGHT/LEFT HEART CATH AND CORONARY ANGIOGRAPHY Bilateral 03/29/2017   Procedure: Right/Left Heart Cath and Coronary Angiography;  Surgeon: Dalia Heading, MD;  Location: [facility];  Service: Cardiovascular;  Laterality: Bilateral;   TOTAL KNEE ARTHROPLASTY Right 04/05/2022   Procedure: TOTAL KNEE ARTHROPLASTY;  Surgeon: Kennedy Bucker, MD;  Location: [facility] ORS;  Service: Orthopedics;  Laterality: Right;   TUBAL LIGATION     VENTRAL HERNIA REPAIR N/A 04/21/2017   Procedure: HERNIA REPAIR VENTRAL ADULT;  Surgeon: Nadeen Landau, MD;  Location: [facility] ORS;  Service: General;  Laterality: N/A;    Family History No h/o GI disease or malignancy  Review of Systems  Constitutional:  Negative for activity change, appetite change, chills, diaphoresis, fatigue, fever and unexpected weight change.  HENT:  Negative for trouble swallowing and voice change.   Respiratory:  Negative for shortness of breath and wheezing.   Cardiovascular:  Negative for chest pain, palpitations and leg swelling.  Gastrointestinal:  Positive for abdominal pain, blood in stool and nausea. Negative for abdominal distention, anal bleeding, constipation, diarrhea, rectal pain and vomiting.  Musculoskeletal:  Negative for arthralgias and myalgias.  Skin:  Negative for color change and pallor.  Neurological:  Negative for dizziness, syncope and weakness.  Psychiatric/Behavioral:  Negative for confusion.   All other systems reviewed and are negative.    Medications No current facility-administered medications on file prior to encounter.   Current Outpatient Medications on File Prior to Encounter  Medication Sig Dispense Refill    furosemide (LASIX) 40 MG tablet Take 80 mg by mouth daily.      levothyroxine (SYNTHROID) 50 MCG tablet Take 50 mcg by mouth daily before breakfast.     lovastatin (MEVACOR) 10 MG tablet Take 10 mg by mouth at bedtime.     pantoprazole (PROTONIX) 40 MG tablet Take 40 mg by mouth daily.     spironolactone (ALDACTONE) 25 MG tablet Take 25 mg by mouth daily.     vitamin B-12 (CYANOCOBALAMIN) 1000 MCG tablet Take 1,000 mcg by mouth daily.     albuterol (PROVENTIL HFA;VENTOLIN HFA) 108 (90 Base) MCG/ACT inhaler Inhale 2 puffs into the lungs every 6 (six) hours as needed for wheezing or shortness of breath.     aspirin EC 81 MG tablet Take 81 mg by mouth daily.     diclofenac Sodium (VOLTAREN) 1 % GEL Apply 1 application. topically 4 (four) times daily as needed (pain).     docusate sodium (COLACE) 100 MG capsule Take 1 capsule (100 mg total) by mouth 2 (two) times daily. (Patient not taking: Reported on 11/02/2022) 10 capsule 0   ENTRESTO 24-26  MG Take 1 tablet by mouth 2 (two) times daily. (Patient not taking: Reported on 01/20/2023)     fluticasone (FLONASE) 50 MCG/ACT nasal spray Place 1 spray into both nostrils at bedtime.     HYDROcodone-acetaminophen (NORCO) 7.5-325 MG tablet Take 1-2 tablets by mouth every 4 (four) hours as needed for severe pain (pain score 7-10). (Patient not taking: Reported on 11/02/2022) 30 tablet 0   latanoprost (XALATAN) 0.005 % ophthalmic solution Place 1 drop into both eyes at bedtime.     metFORMIN (GLUCOPHAGE) 1000 MG tablet Take 1,000 mg by mouth 2 (two) times daily with a meal.     methocarbamol (ROBAXIN) 500 MG tablet Take 1 tablet (500 mg total) by mouth every 6 (six) hours as needed for muscle spasms. (Patient not taking: Reported on 11/02/2022) 30 tablet 0   ondansetron (ZOFRAN) 4 MG tablet Take 1 tablet (4 mg total) by mouth every 6 (six) hours as needed for nausea. (Patient not taking: Reported on 04/22/2022) 20 tablet 0   polyethylene glycol (MIRALAX / GLYCOLAX)  17 g packet Take 17 g by mouth daily as needed for mild constipation. (Patient not taking: Reported on 11/02/2022) 14 each 0   traMADol (ULTRAM) 50 MG tablet Take 1 tablet (50 mg total) by mouth every 6 (six) hours as needed. (Patient not taking: Reported on 01/20/2023) 30 tablet 0   warfarin (COUMADIN) 3 MG tablet Take 3-4.5 mg by mouth See admin instructions. 4.5 mg on Monday, 3 mg all other days (Patient not taking: Reported on 01/20/2023)      Pertinent medications related to GI and procedure were reviewed by me with the patient prior to the procedure   Current Facility-Administered Medications:    0.9 %  sodium chloride infusion, , Intravenous, Continuous, Jaynie Collins, DO  sodium chloride        Allergies  Allergen Reactions   Vioxx [Rofecoxib] Swelling   Allergies were reviewed by me prior to the procedure  Objective   Body mass index is 27.4 kg/m. Vitals:   03/30/23 0709  BP: (!) 98/50  Pulse: 67  Resp: 16  Temp: (!) 97.2 F (36.2 C)  TempSrc: Temporal  SpO2: 100%  Weight: 65.8 kg  Height: 5\' 1"  (1.549 m)     Physical Exam Vitals and nursing note reviewed.  Constitutional:      General: She is not in acute distress.    Appearance: Normal appearance. She is not ill-appearing, toxic-appearing or diaphoretic.  HENT:     Head: Normocephalic and atraumatic.     Nose: Nose normal.     Mouth/Throat:     Mouth: Mucous membranes are moist.     Pharynx: Oropharynx is clear.  Eyes:     General: No scleral icterus.    Extraocular Movements: Extraocular movements intact.  Cardiovascular:     Rate and Rhythm: Normal rate and regular rhythm.     Heart sounds: Normal heart sounds.     No friction rub. No gallop.  Pulmonary:     Effort: Pulmonary effort is normal. No respiratory distress.     Breath sounds: Normal breath sounds. No wheezing, rhonchi or rales.  Abdominal:     General: Abdomen is flat. Bowel sounds are normal. There is no distension.      Palpations: Abdomen is soft.     Tenderness: There is no abdominal tenderness. There is no guarding or rebound.  Musculoskeletal:     Cervical back: Neck supple.     Right lower  leg: No edema.     Left lower leg: No edema.  Skin:    General: Skin is warm and dry.     Coloration: Skin is not jaundiced or pale.  Neurological:     General: No focal deficit present.     Mental Status: She is alert and oriented to person, place, and time. Mental status is at baseline.  Psychiatric:        Mood and Affect: Mood normal.        Behavior: Behavior normal.        Thought Content: Thought content normal.        Judgment: Judgment normal.      Assessment:  Ms. Tammy Arias is a 67 y.o. female  who presents today for Esophagogastroduodenoscopy and Colonoscopy for Iron deficiency anemia, GERD, bright red blood per rectum .  Plan:  Esophagogastroduodenoscopy and Colonoscopy with possible intervention today  Esophagogastroduodenoscopy and Colonoscopy with possible biopsy, control of bleeding, polypectomy, and interventions as necessary has been discussed with the patient/patient representative. Informed consent was obtained from the patient/patient representative after explaining the indication, nature, and risks of the procedure including but not limited to death, bleeding, perforation, missed neoplasm/lesions, cardiorespiratory compromise, and reaction to medications. Opportunity for questions was given and appropriate answers were provided. Patient/patient representative has verbalized understanding is amenable to undergoing the procedure.   Jaynie Collins, DO  [facility]  Portions of the record may have been created with voice recognition software. Occasional wrong-word or 'sound-a-like' substitutions may have occurred due to the inherent limitations of voice recognition software.  Read the chart carefully and recognize, using context, where substitutions may have  occurred.

## 2023-03-30 ENCOUNTER — Encounter: Payer: Self-pay | Admitting: Gastroenterology

## 2023-03-30 ENCOUNTER — Ambulatory Visit: Payer: Medicare Other | Admitting: Anesthesiology

## 2023-03-30 ENCOUNTER — Ambulatory Visit
Admission: RE | Admit: 2023-03-30 | Discharge: 2023-03-30 | Disposition: A | Discharge status: Home / Self Care | Payer: Medicare Other | Attending: Gastroenterology | Admitting: Gastroenterology

## 2023-03-30 ENCOUNTER — Other Ambulatory Visit: Payer: Self-pay

## 2023-03-30 ENCOUNTER — Encounter
Admission: RE | Disposition: A | Discharge status: Home / Self Care | Payer: Self-pay | Source: Home / Self Care | Attending: Gastroenterology

## 2023-03-30 DIAGNOSIS — D123 Benign neoplasm of transverse colon: Secondary | ICD-10-CM | POA: Insufficient documentation

## 2023-03-30 DIAGNOSIS — Z951 Presence of aortocoronary bypass graft: Secondary | ICD-10-CM | POA: Diagnosis not present

## 2023-03-30 DIAGNOSIS — K449 Diaphragmatic hernia without obstruction or gangrene: Secondary | ICD-10-CM | POA: Diagnosis not present

## 2023-03-30 DIAGNOSIS — K2970 Gastritis, unspecified, without bleeding: Secondary | ICD-10-CM | POA: Insufficient documentation

## 2023-03-30 DIAGNOSIS — I8510 Secondary esophageal varices without bleeding: Secondary | ICD-10-CM | POA: Diagnosis not present

## 2023-03-30 DIAGNOSIS — K641 Second degree hemorrhoids: Secondary | ICD-10-CM | POA: Diagnosis not present

## 2023-03-30 DIAGNOSIS — K2289 Other specified disease of esophagus: Secondary | ICD-10-CM | POA: Diagnosis not present

## 2023-03-30 DIAGNOSIS — K7460 Unspecified cirrhosis of liver: Secondary | ICD-10-CM | POA: Diagnosis not present

## 2023-03-30 DIAGNOSIS — D128 Benign neoplasm of rectum: Secondary | ICD-10-CM | POA: Diagnosis not present

## 2023-03-30 DIAGNOSIS — E119 Type 2 diabetes mellitus without complications: Secondary | ICD-10-CM | POA: Diagnosis not present

## 2023-03-30 DIAGNOSIS — I255 Ischemic cardiomyopathy: Secondary | ICD-10-CM | POA: Diagnosis not present

## 2023-03-30 DIAGNOSIS — K5730 Diverticulosis of large intestine without perforation or abscess without bleeding: Secondary | ICD-10-CM | POA: Diagnosis not present

## 2023-03-30 DIAGNOSIS — D509 Iron deficiency anemia, unspecified: Secondary | ICD-10-CM | POA: Diagnosis present

## 2023-03-30 DIAGNOSIS — E039 Hypothyroidism, unspecified: Secondary | ICD-10-CM | POA: Insufficient documentation

## 2023-03-30 DIAGNOSIS — K644 Residual hemorrhoidal skin tags: Secondary | ICD-10-CM | POA: Insufficient documentation

## 2023-03-30 DIAGNOSIS — K219 Gastro-esophageal reflux disease without esophagitis: Secondary | ICD-10-CM | POA: Insufficient documentation

## 2023-03-30 DIAGNOSIS — I110 Hypertensive heart disease with heart failure: Secondary | ICD-10-CM | POA: Insufficient documentation

## 2023-03-30 DIAGNOSIS — I4891 Unspecified atrial fibrillation: Secondary | ICD-10-CM | POA: Diagnosis not present

## 2023-03-30 DIAGNOSIS — Z87891 Personal history of nicotine dependence: Secondary | ICD-10-CM | POA: Insufficient documentation

## 2023-03-30 DIAGNOSIS — I2510 Atherosclerotic heart disease of native coronary artery without angina pectoris: Secondary | ICD-10-CM | POA: Insufficient documentation

## 2023-03-30 DIAGNOSIS — I5022 Chronic systolic (congestive) heart failure: Secondary | ICD-10-CM | POA: Diagnosis not present

## 2023-03-30 DIAGNOSIS — K635 Polyp of colon: Secondary | ICD-10-CM | POA: Diagnosis not present

## 2023-03-30 DIAGNOSIS — Z7901 Long term (current) use of anticoagulants: Secondary | ICD-10-CM | POA: Diagnosis not present

## 2023-03-30 DIAGNOSIS — J449 Chronic obstructive pulmonary disease, unspecified: Secondary | ICD-10-CM | POA: Insufficient documentation

## 2023-03-30 DIAGNOSIS — K625 Hemorrhage of anus and rectum: Secondary | ICD-10-CM | POA: Diagnosis not present

## 2023-03-30 HISTORY — DX: Sleep apnea, unspecified: G47.30

## 2023-03-30 HISTORY — PX: ESOPHAGOGASTRODUODENOSCOPY (EGD) WITH PROPOFOL: SHX5813

## 2023-03-30 HISTORY — PX: COLONOSCOPY WITH PROPOFOL: SHX5780

## 2023-03-30 LAB — GLUCOSE, CAPILLARY: Glucose-Capillary: 125 mg/dL — ABNORMAL HIGH (ref 70–99)

## 2023-03-30 SURGERY — COLONOSCOPY WITH PROPOFOL
Anesthesia: General

## 2023-03-30 MED ORDER — MIDAZOLAM HCL 2 MG/2ML IJ SOLN
INTRAMUSCULAR | Status: DC | PRN
  Administered 2023-03-30 (×2): 1 mg via INTRAVENOUS

## 2023-03-30 MED ORDER — GLYCOPYRROLATE 0.2 MG/ML IJ SOLN
INTRAMUSCULAR | Status: DC | PRN
  Administered 2023-03-30: .2 mg via INTRAVENOUS

## 2023-03-30 MED ORDER — PHENYLEPHRINE 80 MCG/ML (10ML) SYRINGE FOR IV PUSH (FOR BLOOD PRESSURE SUPPORT)
PREFILLED_SYRINGE | INTRAVENOUS | Status: DC | PRN
  Administered 2023-03-30 (×10): 80 ug via INTRAVENOUS

## 2023-03-30 MED ORDER — PROPOFOL 10 MG/ML IV BOLUS
INTRAVENOUS | Status: DC | PRN
  Administered 2023-03-30: 30 mg via INTRAVENOUS
  Administered 2023-03-30: 20 mg via INTRAVENOUS

## 2023-03-30 MED ORDER — SODIUM CHLORIDE 0.9 % IV SOLN: INTRAVENOUS | Status: DC

## 2023-03-30 MED ORDER — PROPOFOL 500 MG/50ML IV EMUL
INTRAVENOUS | Status: DC | PRN
  Administered 2023-03-30: 80 ug/kg/min via INTRAVENOUS

## 2023-03-30 MED ORDER — SEVOFLURANE IN SOLN
RESPIRATORY_TRACT | Status: AC
  Filled 2023-03-30: qty 250

## 2023-03-30 MED ORDER — FENTANYL CITRATE (PF) 100 MCG/2ML IJ SOLN
INTRAMUSCULAR | Status: AC
  Filled 2023-03-30: qty 2

## 2023-03-30 MED ORDER — MIDAZOLAM HCL 2 MG/2ML IJ SOLN
INTRAMUSCULAR | Status: AC
  Filled 2023-03-30: qty 2

## 2023-03-30 NOTE — Anesthesia Preprocedure Evaluation (Signed)
Anesthesia Evaluation  Patient identified by MRN, date of birth, ID band Patient awake    Reviewed: Allergy & Precautions, NPO status , Patient's Chart, lab work & pertinent test results  History of Anesthesia Complications Negative for: history of anesthetic complications  Airway Mallampati: III   Neck ROM: Full    Dental  (+) Lower Dentures, Upper Dentures, Dental Advidsory Given   Pulmonary sleep apnea , COPD,  COPD inhaler, former smoker   Pulmonary exam normal breath sounds clear to auscultation       Cardiovascular hypertension, + CAD (s/p CABG), + CABG and +CHF (ICM, EF 40%)  + dysrhythmias (a fib on warfarin, last 4 days ago) + Valvular Problems/Murmurs (cor triatrium s/p repair)  Rhythm:Irregular Rate:Normal + Systolic murmurs ECG 03/29/22:  Atrial fibrillation ST & T wave abnormality, consider inferolateral ischemia  Myocardial perfusion 06/10/20:  LVEF= 51%  Regional wallmotion: reveals normal myocardial thickening and wall motion.  The overall quality of the study is good.   Artifacts noted: no  Left ventricular cavity: normal.  Perfusion Analysis: SPECT images demonstrate small perfusion abnormality of mild intensity is present in the inferior region on the stress images.  Defect type: Fixed  Cardiac MRI 09/28/20:  1. The left ventricle is upper limits of normal in cavity size with normal wall thickness. Global systolic function is moderately reduced with an LV ejection fraction calculated at 42%. There is wall thinning and severe hypokinesis to akinesis of the basal-to-mid lateral wall. There is global mild-to-moderate hypokinesis of the remaining LV walls.  2. The right ventricle is mildly dilated in cavity size with normal wall thickness. Global RV systolic function is mild-moderately reduced with an RVEF calculated at 37%.  3. Both atria are severely enlarged. There is no obvious inter-atrial flow communication,  and Qp:Qs is normal, however a small PFO cannotbe ruled out by CMR.  4. The aortic valve is trileaflet in morphology. There is no significant aortic valve stenosis or regurgitation. There is a central jet of mild-moderate mitral regurgitation. There is also a central jet of moderate tricuspid regurgitation. There is mild pulmonic regurgitation.  5. Delayed enhancement imaging is abnormal. There is a transmural myocardial infarction involving the  basal-to-mid anterolateral and inferolateral LV walls, which is consistent with the LCx perfusion territory. There are areas of fatty metaplasia within the infarcted region.  6. Adenosine stress perfusion imaging demonstrates no evidence of inducible myocardial ischemia.   7. No intracardiac thrombus visualized.  8. The pericardium is normal in thickness. There is no significant pericardial effusion.  9. 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    Neuro/Psych  Headaches TIA   GI/Hepatic ,GERD  ,,  Endo/Other  diabetes, Type 2Hypothyroidism    Renal/GU negative Renal ROS     Musculoskeletal   Abdominal   Peds  Hematology  (+) Blood dyscrasia, anemia   Anesthesia Other Findings Discussed patient her elevated risk of adverse events under anesthesia due to her heart condition. Patient understands and agrees to proceed.  Cardiology note 03/14/23:  CAD s/p CABG 2005, heart catheterization completed in 2018 showed a occluded mid LAD with a patent SVG graft to a diagonal provided flow to the LAD. The RCA and left circumflex were relatively free of disease otherwise with no obvious other grafts present. Continue aspirin 81 mg indefinitely, lovastatin, and coreg. Cardiac CT ordered previously Chronic systolic heart failure, EF estimated at 30-35%; although my review of the echocardiogram in some views this  might even be higher at just over 40%. Continue Coreg, Entresto, spironolactone. Today we added Jardiance and  reduced her Lasix. Ischemic cardiomyopathy, EF=30-35%, recommend above medical therapy    # Cor triatriatum ASD repair 2005 Closed in 2005 at [facility]. No apparent shunting on recent echo or see MRI. 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  #CAD s/p CABG 2005 Heart catheterization completed in 2018 showed a occluded mid LAD with a patent SVG graft to a diagonal provided flow to the LAD. The RCA and left circumflex were relatively free of disease otherwise with no obvious other grafts present. In reviewing the course of events with the patient, it sounds that this was done emergently following closure of her ASD in 2005 possibly as a result of her procedure. See MRI completed in November 2021 showed scar in the left circumflex distribution. - Aspirin 81 mg indefinitely - Continue lovastatin 10 mg - Coninue coreg 3.125 mg BID - Risk factor modification-lipid panel 08/19/2021-LDL 85  #Heart failure with reduced ejection fraction EF estimated at 42% by cardiac MRI in 2021. NYHA class 1. -Continue Coreg 3.125 mg twice daily -Continue Entresto 24-26 -Continue spironolactone 25 -Continue Lasix 40 mg daily -Check BMP today; renal function has not been checked since initiation of Entresto  #Chronic atrial fibrillation Remains well rate controlled. -Continue Coreg 3.125 mg twice daily -Continue warfarin  #Hypertension Blood pressure is well controlled on maximally tolerated GDMT for heart failure. Continue medications as above.  #Type 2 diabetes -Not on insulin therapy. Managed by primary care provider.  Return in about 6 months (around 05/11/2022).   Reproductive/Obstetrics                             Anesthesia Physical Anesthesia Plan  ASA: 4  Anesthesia Plan: General   Post-op Pain Management:    Induction: Intravenous  PONV Risk Score and Plan: 3 and Ondansetron, Propofol infusion, TIVA and Treatment may vary due to age or medical condition  Airway Management  Planned: Natural Airway and Nasal Cannula  Additional Equipment:   Intra-op Plan:   Post-operative Plan: Extubation in OR  Informed Consent: I have reviewed the patients History and Physical, chart, labs and discussed the procedure including the risks, benefits and alternatives for the proposed anesthesia with the patient or authorized representative who has indicated his/her understanding and acceptance.     Dental Advisory Given  Plan Discussed with: Anesthesiologist, CRNA and Surgeon  Anesthesia Plan Comments: (Patient consented for risks of anesthesia including but not limited to:  - adverse reactions to medications - risk of airway placement if required - damage to eyes, teeth, lips or other oral mucosa - nerve damage due to positioning  - sore throat or hoarseness - Damage to heart, brain, nerves, lungs, other parts of body or loss of life  Patient voiced understanding.)        Anesthesia Quick Evaluation

## 2023-03-30 NOTE — Op Note (Signed)
[facility] Gastroenterology Patient Name: Tammy Arias Procedure Date: 03/30/2023 7:17 AM MRN: [id number] Account #: [id number] Date of Birth: 1956/11/26 Admit Type: Outpatient Age: 67 Room: [facility] ENDO ROOM 1 Gender: Female Note Status: Finalized Instrument Name: Upper Endoscope [id number] Procedure:             Upper GI endoscopy Indications:           Iron deficiency anemia Providers:             Trenda Moots, DO Referring MD:          Rhona Leavens. Burnett Sheng, MD (Referring MD) Medicines:             Monitored Anesthesia Care Complications:         No immediate complications. Estimated blood loss:                         Minimal. Procedure:             Pre-Anesthesia Assessment:                        - Prior to the procedure, a History and Physical was                         performed, and patient medications and allergies were                         reviewed. The patient is competent. The risks and                         benefits of the procedure and the sedation options and                         risks were discussed with the patient. All questions                         were answered and informed consent was obtained.                         Patient identification and proposed procedure were                         verified by the physician, the nurse, the anesthetist                         and the technician in the endoscopy suite. Mental                         Status Examination: alert and oriented. Airway                         Examination: normal oropharyngeal airway and neck                         mobility. Respiratory Examination: clear to                         auscultation. CV Examination: RRR, no murmurs, no S3  or S4. Prophylactic Antibiotics: The patient does not                         require prophylactic antibiotics. Prior                         Anticoagulants: The patient has taken Eliquis                          (apixaban), last dose was 3 days prior to procedure.                         ASA Grade Assessment: IV - A patient with severe                         systemic disease that is a constant threat to life.                         After reviewing the risks and benefits, the patient                         was deemed in satisfactory condition to undergo the                         procedure. The anesthesia plan was to use monitored                         anesthesia care (MAC). Immediately prior to                         administration of medications, the patient was                         re-assessed for adequacy to receive sedatives. The                         heart rate, respiratory rate, oxygen saturations,                         blood pressure, adequacy of pulmonary ventilation, and                         response to care were monitored throughout the                         procedure. The physical status of the patient was                         re-assessed after the procedure.                        After obtaining informed consent, the endoscope was                         passed under direct vision. Throughout the procedure,                         the patient's blood pressure, pulse, and oxygen  saturations were monitored continuously. The Endoscope                         was introduced through the mouth, and advanced to the                         second part of duodenum. The upper GI endoscopy was                         accomplished without difficulty. The patient tolerated                         the procedure well. Findings:      The duodenal bulb, first portion of the duodenum and second portion of       the duodenum were normal. Biopsies for histology were taken with a cold       forceps for evaluation of celiac disease. Estimated blood loss was       minimal.      Localized mild inflammation characterized by erythema was found in the        gastric antrum. Biopsies were taken with a cold forceps for Helicobacter       pylori testing. Estimated blood loss was minimal.      A small hiatal hernia was present. Estimated blood loss: none.      The exam of the stomach was otherwise normal.      No GAVE, portal htn gastropathy or gastric varices. Estimated blood       loss: none.      The Z-line was irregular. Two tongue of salmon colored mucosa. Biopsies       were taken with a cold forceps for histology. Estimated blood loss was       minimal.      Grade II varices were found in the lower third of the esophagus.       Estimated blood loss: none.      The exam of the esophagus was otherwise normal.      Esophagogastric landmarks were identified: the gastroesophageal junction       was found at 38 cm from the incisors. Impression:            - Normal duodenal bulb, first portion of the duodenum                         and second portion of the duodenum. Biopsied.                        - Gastritis. Biopsied.                        - Small hiatal hernia.                        - Z-line irregular. Biopsied.                        - Grade II esophageal varices.                        - Esophagogastric landmarks identified. Recommendation:        - Patient has a contact number available for  emergencies. The signs and symptoms of potential                         delayed complications were discussed with the patient.                         Return to normal activities tomorrow. Written                         discharge instructions were provided to the patient.                        - Discharge patient to home.                        - Resume previous diet.                        - Continue present medications.                        - Await pathology results.                        - see colonoscopy report for eliquis resumption                        - Repeat upper endoscopy in 1 year for surveillance.                         - Return to GI office as previously scheduled.                        - proceed with colonoscopy                        - The findings and recommendations were discussed with                         the patient. Procedure Code(s):     --- Professional ---                        [id number], Esophagogastroduodenoscopy, flexible,                         transoral; with biopsy, single or multiple Diagnosis Code(s):     --- Professional ---                        K29.70, Gastritis, unspecified, without bleeding                        K44.9, Diaphragmatic hernia without obstruction or                         gangrene                        K22.89, Other specified disease of esophagus                        I85.00, Esophageal varices without bleeding  D50.9, Iron deficiency anemia, unspecified CPT copyright 2022 American [employer] Association. All rights reserved. The codes documented in this report are preliminary and upon coder review may  be revised to meet current compliance requirements. Attending Participation:      I personally performed the entire procedure. Elfredia Nevins, DO Jaynie Collins DO, DO 03/30/2023 7:55:32 AM This report has been signed electronically. Number of Addenda: 0 Note Initiated On: 03/30/2023 7:17 AM Estimated Blood Loss:  Estimated blood loss was minimal.      [facility]

## 2023-03-30 NOTE — Transfer of Care (Signed)
Immediate Anesthesia Transfer of Care Note  Patient: Tammy Arias  Procedure(s) Performed: COLONOSCOPY WITH PROPOFOL ESOPHAGOGASTRODUODENOSCOPY (EGD) WITH PROPOFOL  Patient Location: PACU  Anesthesia Type:General  Level of Consciousness: awake, alert , and oriented  Airway & Oxygen Therapy: Patient Spontanous Breathing and Patient connected to nasal cannula oxygen  Post-op Assessment: Report given to RN  Post vital signs: Reviewed and stable  Last Vitals:  Vitals Value Taken Time  BP 83/53 03/30/23 0825  Temp    Pulse 83 03/30/23 0826  Resp 17 03/30/23 0826  SpO2 100 % 03/30/23 0826  Vitals shown include unvalidated device data.  Last Pain:  Vitals:   03/30/23 0823  TempSrc:   PainSc: 0-No pain         Complications: No notable events documented.

## 2023-03-30 NOTE — Op Note (Signed)
[facility] Gastroenterology Patient Name: Tammy Arias Procedure Date: 03/30/2023 7:16 AM MRN: [id number] Account #: [id number] Date of Birth: 07-06-1956 Admit Type: Outpatient Age: 67 Room: [facility] ENDO ROOM 1 Gender: Female Note Status: Finalized Instrument Name: Colonscope [id number] Procedure:             Colonoscopy Indications:           Iron deficiency anemia Providers:             Trenda Moots, DO Referring MD:          Rhona Leavens. Burnett Sheng, MD (Referring MD) Medicines:             Monitored Anesthesia Care Complications:         No immediate complications. Estimated blood loss:                         Minimal. Procedure:             Pre-Anesthesia Assessment:                        - Prior to the procedure, a History and Physical was                         performed, and patient medications and allergies were                         reviewed. The patient is competent. The risks and                         benefits of the procedure and the sedation options and                         risks were discussed with the patient. All questions                         were answered and informed consent was obtained.                         Patient identification and proposed procedure were                         verified by the physician, the nurse, the anesthetist                         and the technician in the endoscopy suite. Mental                         Status Examination: alert and oriented. Airway                         Examination: normal oropharyngeal airway and neck                         mobility. Respiratory Examination: clear to                         auscultation. CV Examination: RRR, no murmurs, no S3  or S4. Prophylactic Antibiotics: The patient does not                         require prophylactic antibiotics. Prior                         Anticoagulants: The patient has taken Eliquis                          (apixaban), last dose was 3 days prior to procedure.                         ASA Grade Assessment: IV - A patient with severe                         systemic disease that is a constant threat to life.                         After reviewing the risks and benefits, the patient                         was deemed in satisfactory condition to undergo the                         procedure. The anesthesia plan was to use monitored                         anesthesia care (MAC). Immediately prior to                         administration of medications, the patient was                         re-assessed for adequacy to receive sedatives. The                         heart rate, respiratory rate, oxygen saturations,                         blood pressure, adequacy of pulmonary ventilation, and                         response to care were monitored throughout the                         procedure. The physical status of the patient was                         re-assessed after the procedure.                        After obtaining informed consent, the colonoscope was                         passed under direct vision. Throughout the procedure,                         the patient's blood pressure, pulse, and oxygen  saturations were monitored continuously. The                         Colonoscope was introduced through the anus and                         advanced to the the terminal ileum, with                         identification of the appendiceal orifice and IC                         valve. The colonoscopy was performed without                         difficulty. The patient tolerated the procedure well.                         The quality of the bowel preparation was evaluated                         using the BBPS [facility] Bowel Preparation Scale) with                         scores of: Right Colon = 3 (entire mucosa seen well                         with no residual  staining, small fragments of stool or                         opaque liquid), Transverse Colon = 3 (entire mucosa                         seen well with no residual staining, small fragments                         of stool or opaque liquid) and Left Colon = 2 (minor                         amount of residual staining, small fragments of stool                         and/or opaque liquid, but mucosa seen well). The total                         BBPS score equals 8. The quality of the bowel                         preparation was excellent. The terminal ileum,                         ileocecal valve, appendiceal orifice, and rectum were                         photographed. Findings:      Hemorrhoids were found on perianal exam.      The digital rectal exam was normal. Pertinent negatives include  normal       sphincter tone.      The terminal ileum appeared normal. Estimated blood loss: none.      Retroflexion in the right colon was performed.      Multiple small-mouthed diverticula were found in the recto-sigmoid colon       and sigmoid colon. Estimated blood loss: none.      Non-bleeding internal hemorrhoids were found during retroflexion and       during perianal exam. The hemorrhoids were Grade II (internal       hemorrhoids that prolapse but reduce spontaneously). Estimated blood       loss: none.      Five sessile polyps were found in the rectum, transverse colon,       ascending colon and cecum. The polyps were 1 to 2 mm in size. These       polyps were removed with a cold biopsy forceps. Resection and retrieval       were complete. These polyps were removed with a cold biopsy forceps.       Resection and retrieval were complete. Estimated blood loss was minimal.      A 3 to 4 mm polyp was found in the rectum. The polyp was sessile. The       polyp was removed with a cold snare. Resection and retrieval were       complete. Estimated blood loss was minimal.      The exam was  otherwise without abnormality on direct and retroflexion       views. Impression:            - Hemorrhoids found on perianal exam.                        - The examined portion of the ileum was normal.                        - Diverticulosis in the recto-sigmoid colon and in the                         sigmoid colon.                        - Non-bleeding internal hemorrhoids.                        - Five 1 to 2 mm polyps in the rectum, in the                         transverse colon, in the ascending colon and in the                         cecum, removed with a cold biopsy forceps. Resected                         and retrieved.                        - One 3 to 4 mm polyp in the rectum, removed with a                         cold snare. Resected and retrieved.                        -  The examination was otherwise normal on direct and                         retroflexion views. Recommendation:        - Patient has a contact number available for                         emergencies. The signs and symptoms of potential                         delayed complications were discussed with the patient.                         Return to normal activities tomorrow. Written                         discharge instructions were provided to the patient.                        - Discharge patient to home.                        - Resume previous diet.                        - Continue present medications.                        - Resume Eliquis (apixaban) at prior dose in 2 days.                         Refer to managing physician for further adjustment of                         therapy.                        - No ibuprofen, naproxen, or other non-steroidal                         anti-inflammatory drugs for 5 days after polyp removal.                        - Attempt to uptitrate carvediolol given varices on EGD                        - Repeat colonoscopy for surveillance based on                          pathology results.                        - Return to GI office as previously scheduled.                        - The findings and recommendations were discussed with                         the patient. Procedure Code(s):     --- Professional ---  78295, Colonoscopy, flexible; with removal of                         tumor(s), polyp(s), or other lesion(s) by snare                         technique                        45380, 59, Colonoscopy, flexible; with biopsy, single                         or multiple Diagnosis Code(s):     --- Professional ---                        K64.1, Second degree hemorrhoids                        D12.8, Benign neoplasm of rectum                        D12.3, Benign neoplasm of transverse colon (hepatic                         flexure or splenic flexure)                        D12.2, Benign neoplasm of ascending colon                        D12.0, Benign neoplasm of cecum                        D50.9, Iron deficiency anemia, unspecified                        K57.30, Diverticulosis of large intestine without                         perforation or abscess without bleeding CPT copyright 2022 American Medical Association. All rights reserved. The codes documented in this report are preliminary and upon coder review may  be revised to meet current compliance requirements. Attending Participation:      I personally performed the entire procedure. Elfredia Nevins, DO Jaynie Collins DO, DO 03/30/2023 8:27:33 AM This report has been signed electronically. Number of Addenda: 0 Note Initiated On: 03/30/2023 7:16 AM Scope Withdrawal Time: 0 hours 16 minutes 41 seconds  Total Procedure Duration: 0 hours 20 minutes 22 seconds  Estimated Blood Loss:  Estimated blood loss was minimal.      [facility]

## 2023-03-30 NOTE — Interval H&P Note (Signed)
History and Physical Interval Note: Preprocedure H&P from 03/30/23  was reviewed and there was no interval change after seeing and examining the patient.  Written consent was obtained from the patient after discussion of risks, benefits, and alternatives. Patient has consented to proceed with Esophagogastroduodenoscopy and Colonoscopy with possible intervention   03/30/2023 7:28 AM  Lucien Mons  has presented today for surgery, with the diagnosis of IDA GERD.  The various methods of treatment have been discussed with the patient and family. After consideration of risks, benefits and other options for treatment, the patient has consented to  Procedure(s): COLONOSCOPY WITH PROPOFOL (N/A) ESOPHAGOGASTRODUODENOSCOPY (EGD) WITH PROPOFOL (N/A) as a surgical intervention.  The patient's history has been reviewed, patient examined, no change in status, stable for surgery.  I have reviewed the patient's chart and labs.  Questions were answered to the patient's satisfaction.     Jaynie Collins

## 2023-03-30 NOTE — Anesthesia Postprocedure Evaluation (Signed)
Anesthesia Post Note  Patient: Tammy Arias  Procedure(s) Performed: COLONOSCOPY WITH PROPOFOL ESOPHAGOGASTRODUODENOSCOPY (EGD) WITH PROPOFOL  Patient location during evaluation: Endoscopy Anesthesia Type: General Level of consciousness: awake and alert Pain management: pain level controlled Vital Signs Assessment: post-procedure vital signs reviewed and stable Respiratory status: spontaneous breathing, nonlabored ventilation, respiratory function stable and patient connected to nasal cannula oxygen Cardiovascular status: blood pressure returned to baseline and stable Postop Assessment: no apparent nausea or vomiting Anesthetic complications: no  No notable events documented.   Last Vitals:  Vitals:   03/30/23 0823 03/30/23 0833  BP: (!) 83/9 (!) 82/60  Pulse: 87 78  Resp: 16 16  Temp:    SpO2: 100% 100%    Last Pain:  Vitals:   03/30/23 0833  TempSrc:   PainSc: 0-No pain                 Stephanie Coup

## 2023-03-31 ENCOUNTER — Encounter: Payer: Self-pay | Admitting: Gastroenterology

## 2023-03-31 LAB — SURGICAL PATHOLOGY

## 2023-04-05 ENCOUNTER — Encounter: Payer: Self-pay | Admitting: Gastroenterology

## 2023-04-20 ENCOUNTER — Inpatient Hospital Stay: Payer: Medicare Other | Attending: Oncology

## 2023-04-20 ENCOUNTER — Other Ambulatory Visit: Payer: Medicare Other

## 2023-04-20 DIAGNOSIS — D509 Iron deficiency anemia, unspecified: Secondary | ICD-10-CM | POA: Diagnosis not present

## 2023-04-20 DIAGNOSIS — D696 Thrombocytopenia, unspecified: Secondary | ICD-10-CM | POA: Diagnosis present

## 2023-04-20 DIAGNOSIS — D500 Iron deficiency anemia secondary to blood loss (chronic): Secondary | ICD-10-CM

## 2023-04-20 LAB — CBC WITH DIFFERENTIAL/PLATELET
Abs Immature Granulocytes: 0.01 10*3/uL (ref 0.00–0.07)
Basophils Absolute: 0 10*3/uL (ref 0.0–0.1)
Basophils Relative: 1 %
Eosinophils Absolute: 0.3 10*3/uL (ref 0.0–0.5)
Eosinophils Relative: 12 %
HCT: 36.7 % (ref 36.0–46.0)
Hemoglobin: 12.2 g/dL (ref 12.0–15.0)
Immature Granulocytes: 1 %
Lymphocytes Relative: 15 %
Lymphs Abs: 0.3 10*3/uL — ABNORMAL LOW (ref 0.7–4.0)
MCH: 34.6 pg — ABNORMAL HIGH (ref 26.0–34.0)
MCHC: 33.2 g/dL (ref 30.0–36.0)
MCV: 104 fL — ABNORMAL HIGH (ref 80.0–100.0)
Monocytes Absolute: 0.3 10*3/uL (ref 0.1–1.0)
Monocytes Relative: 13 %
Neutro Abs: 1.3 10*3/uL — ABNORMAL LOW (ref 1.7–7.7)
Neutrophils Relative %: 58 %
Platelets: 83 10*3/uL — ABNORMAL LOW (ref 150–400)
RBC: 3.53 MIL/uL — ABNORMAL LOW (ref 3.87–5.11)
RDW: 12.5 % (ref 11.5–15.5)
WBC: 2.1 10*3/uL — ABNORMAL LOW (ref 4.0–10.5)
nRBC: 0 % (ref 0.0–0.2)

## 2023-04-20 LAB — IRON AND TIBC
Iron: 80 ug/dL (ref 28–170)
Saturation Ratios: 19 % (ref 10.4–31.8)
TIBC: 426 ug/dL (ref 250–450)
UIBC: 346 ug/dL

## 2023-04-20 LAB — FERRITIN: Ferritin: 65 ng/mL (ref 11–307)

## 2023-04-26 ENCOUNTER — Ambulatory Visit: Payer: Medicare Other | Admitting: Oncology

## 2023-04-26 ENCOUNTER — Other Ambulatory Visit: Payer: Medicare Other

## 2023-05-23 DIAGNOSIS — G4733 Obstructive sleep apnea (adult) (pediatric): Secondary | ICD-10-CM

## 2023-06-06 ENCOUNTER — Encounter: Payer: Self-pay | Admitting: *Deleted

## 2023-06-06 ENCOUNTER — Inpatient Hospital Stay
Admission: EM | Admit: 2023-06-06 | Discharge: 2023-06-08 | DRG: 378 | Disposition: A | Discharge status: Home / Self Care | Payer: Medicare Other | Attending: Internal Medicine | Admitting: Internal Medicine

## 2023-06-06 ENCOUNTER — Emergency Department: Payer: Medicare Other

## 2023-06-06 ENCOUNTER — Other Ambulatory Visit: Payer: Self-pay

## 2023-06-06 DIAGNOSIS — I959 Hypotension, unspecified: Secondary | ICD-10-CM | POA: Insufficient documentation

## 2023-06-06 DIAGNOSIS — I480 Paroxysmal atrial fibrillation: Secondary | ICD-10-CM | POA: Diagnosis present

## 2023-06-06 DIAGNOSIS — K5731 Diverticulosis of large intestine without perforation or abscess with bleeding: Secondary | ICD-10-CM | POA: Diagnosis not present

## 2023-06-06 DIAGNOSIS — E785 Hyperlipidemia, unspecified: Secondary | ICD-10-CM | POA: Diagnosis present

## 2023-06-06 DIAGNOSIS — D696 Thrombocytopenia, unspecified: Secondary | ICD-10-CM

## 2023-06-06 DIAGNOSIS — I25810 Atherosclerosis of coronary artery bypass graft(s) without angina pectoris: Secondary | ICD-10-CM | POA: Diagnosis present

## 2023-06-06 DIAGNOSIS — K922 Gastrointestinal hemorrhage, unspecified: Secondary | ICD-10-CM | POA: Diagnosis not present

## 2023-06-06 DIAGNOSIS — E039 Hypothyroidism, unspecified: Secondary | ICD-10-CM | POA: Diagnosis present

## 2023-06-06 DIAGNOSIS — Z833 Family history of diabetes mellitus: Secondary | ICD-10-CM

## 2023-06-06 DIAGNOSIS — K7460 Unspecified cirrhosis of liver: Secondary | ICD-10-CM | POA: Diagnosis present

## 2023-06-06 DIAGNOSIS — G4733 Obstructive sleep apnea (adult) (pediatric): Secondary | ICD-10-CM

## 2023-06-06 DIAGNOSIS — Z7901 Long term (current) use of anticoagulants: Secondary | ICD-10-CM

## 2023-06-06 DIAGNOSIS — Z8711 Personal history of peptic ulcer disease: Secondary | ICD-10-CM

## 2023-06-06 DIAGNOSIS — Z951 Presence of aortocoronary bypass graft: Secondary | ICD-10-CM

## 2023-06-06 DIAGNOSIS — T45515A Adverse effect of anticoagulants, initial encounter: Secondary | ICD-10-CM | POA: Diagnosis present

## 2023-06-06 DIAGNOSIS — Z8249 Family history of ischemic heart disease and other diseases of the circulatory system: Secondary | ICD-10-CM

## 2023-06-06 DIAGNOSIS — Z79890 Hormone replacement therapy (postmenopausal): Secondary | ICD-10-CM

## 2023-06-06 DIAGNOSIS — K5520 Angiodysplasia of colon without hemorrhage: Secondary | ICD-10-CM | POA: Diagnosis present

## 2023-06-06 DIAGNOSIS — Z961 Presence of intraocular lens: Secondary | ICD-10-CM | POA: Diagnosis present

## 2023-06-06 DIAGNOSIS — Z8673 Personal history of transient ischemic attack (TIA), and cerebral infarction without residual deficits: Secondary | ICD-10-CM

## 2023-06-06 DIAGNOSIS — Z79899 Other long term (current) drug therapy: Secondary | ICD-10-CM

## 2023-06-06 DIAGNOSIS — K219 Gastro-esophageal reflux disease without esophagitis: Secondary | ICD-10-CM | POA: Diagnosis present

## 2023-06-06 DIAGNOSIS — Z87891 Personal history of nicotine dependence: Secondary | ICD-10-CM

## 2023-06-06 DIAGNOSIS — I2510 Atherosclerotic heart disease of native coronary artery without angina pectoris: Secondary | ICD-10-CM | POA: Diagnosis present

## 2023-06-06 DIAGNOSIS — D6832 Hemorrhagic disorder due to extrinsic circulating anticoagulants: Secondary | ICD-10-CM | POA: Diagnosis present

## 2023-06-06 DIAGNOSIS — Z7982 Long term (current) use of aspirin: Secondary | ICD-10-CM

## 2023-06-06 DIAGNOSIS — Z7984 Long term (current) use of oral hypoglycemic drugs: Secondary | ICD-10-CM

## 2023-06-06 DIAGNOSIS — I272 Pulmonary hypertension, unspecified: Secondary | ICD-10-CM | POA: Diagnosis present

## 2023-06-06 DIAGNOSIS — E119 Type 2 diabetes mellitus without complications: Secondary | ICD-10-CM

## 2023-06-06 DIAGNOSIS — I255 Ischemic cardiomyopathy: Secondary | ICD-10-CM | POA: Diagnosis present

## 2023-06-06 DIAGNOSIS — I5032 Chronic diastolic (congestive) heart failure: Secondary | ICD-10-CM | POA: Insufficient documentation

## 2023-06-06 DIAGNOSIS — I110 Hypertensive heart disease with heart failure: Secondary | ICD-10-CM | POA: Diagnosis present

## 2023-06-06 DIAGNOSIS — Z8619 Personal history of other infectious and parasitic diseases: Secondary | ICD-10-CM

## 2023-06-06 DIAGNOSIS — Z803 Family history of malignant neoplasm of breast: Secondary | ICD-10-CM

## 2023-06-06 DIAGNOSIS — E11649 Type 2 diabetes mellitus with hypoglycemia without coma: Secondary | ICD-10-CM | POA: Diagnosis not present

## 2023-06-06 DIAGNOSIS — K766 Portal hypertension: Secondary | ICD-10-CM

## 2023-06-06 DIAGNOSIS — Z96651 Presence of right artificial knee joint: Secondary | ICD-10-CM | POA: Diagnosis present

## 2023-06-06 DIAGNOSIS — I10 Essential (primary) hypertension: Secondary | ICD-10-CM | POA: Diagnosis present

## 2023-06-06 DIAGNOSIS — M810 Age-related osteoporosis without current pathological fracture: Secondary | ICD-10-CM | POA: Diagnosis present

## 2023-06-06 DIAGNOSIS — E538 Deficiency of other specified B group vitamins: Secondary | ICD-10-CM | POA: Diagnosis present

## 2023-06-06 LAB — BASIC METABOLIC PANEL
Anion gap: 14 (ref 5–15)
BUN: 27 mg/dL — ABNORMAL HIGH (ref 8–23)
CO2: 26 mmol/L (ref 22–32)
Calcium: 9.7 mg/dL (ref 8.9–10.3)
Chloride: 98 mmol/L (ref 98–111)
Creatinine, Ser: 1.15 mg/dL — ABNORMAL HIGH (ref 0.44–1.00)
GFR, Estimated: 52 mL/min — ABNORMAL LOW (ref >60)
Glucose, Bld: 98 mg/dL (ref 70–99)
Potassium: 3.5 mmol/L (ref 3.5–5.1)
Sodium: 138 mmol/L (ref 135–145)

## 2023-06-06 LAB — CBC WITH DIFFERENTIAL/PLATELET
Abs Immature Granulocytes: 0.01 10*3/uL (ref 0.00–0.07)
Basophils Absolute: 0 10*3/uL (ref 0.0–0.1)
Basophils Relative: 1 %
Eosinophils Absolute: 0.3 10*3/uL (ref 0.0–0.5)
Eosinophils Relative: 9 %
HCT: 37.5 % (ref 36.0–46.0)
Hemoglobin: 12.3 g/dL (ref 12.0–15.0)
Immature Granulocytes: 0 %
Lymphocytes Relative: 15 %
Lymphs Abs: 0.5 10*3/uL — ABNORMAL LOW (ref 0.7–4.0)
MCH: 33.5 pg (ref 26.0–34.0)
MCHC: 32.8 g/dL (ref 30.0–36.0)
MCV: 102.2 fL — ABNORMAL HIGH (ref 80.0–100.0)
Monocytes Absolute: 0.3 10*3/uL (ref 0.1–1.0)
Monocytes Relative: 10 %
Neutro Abs: 2.1 10*3/uL (ref 1.7–7.7)
Neutrophils Relative %: 65 %
Platelets: 108 10*3/uL — ABNORMAL LOW (ref 150–400)
RBC: 3.67 MIL/uL — ABNORMAL LOW (ref 3.87–5.11)
RDW: 12.4 % (ref 11.5–15.5)
WBC: 3.2 10*3/uL — ABNORMAL LOW (ref 4.0–10.5)
nRBC: 0 % (ref 0.0–0.2)

## 2023-06-06 LAB — PROTIME-INR
INR: 1.3 — ABNORMAL HIGH (ref 0.8–1.2)
Prothrombin Time: 16.2 seconds — ABNORMAL HIGH (ref 11.4–15.2)

## 2023-06-06 MED ORDER — SODIUM CHLORIDE 0.9 % IV BOLUS
500.0000 mL | Freq: Once | INTRAVENOUS | Status: AC
  Administered 2023-06-06: 500 mL via INTRAVENOUS

## 2023-06-06 MED ORDER — IOHEXOL 350 MG/ML SOLN
100.0000 mL | Freq: Once | INTRAVENOUS | Status: AC | PRN
  Administered 2023-06-06: 100 mL via INTRAVENOUS

## 2023-06-06 MED ORDER — SODIUM CHLORIDE 0.9 % IV SOLN: Freq: Once | INTRAVENOUS | Status: AC

## 2023-06-06 NOTE — H&P (Incomplete)
History and Physical    Patient: Tammy Arias UJW:[id number] DOB: 10/30/1956 DOA: 06/06/2023 DOS: the patient was seen and examined on 06/06/2023 PCP: Jerl Mina, MD  Patient coming from: Home  Chief Complaint:  Chief Complaint  Patient presents with  . Hematuria    HPI: Tammy Arias is a 68 y.o. 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  She is unsure with what the bleeding came from.  As she stood up the blood was running down her legs.  She was previously in her usual state of health.  She denies chest pain shortness or lightheadedness.  Denies abdominal pain.  Has no nausea or vomiting. ED course and data review: Soft blood pressures down to 101/59 with otherwise normal vitals.  Labs with hemoglobin 12.3, platelets108 down from 80-90 a few months prior, WBC 3.2 about the same as the last few months INR 1.3.  Creatinine at baseline at 1.15.  Physical exam done in the ED revealed bright red blood in the rectal vault and none on vaginal exam Patient was treated with fluid bolus and CT angiogram abdomen and pelvis ordered.  Hospitalist consulted for admission.   Review of Systems: As mentioned in the history of present illness. All other systems reviewed and are negative.  Past Medical History:  Diagnosis Date  . AC (acromioclavicular) joint bone spurs, right   . Anemia   . Arthritis   . Atrial fibrillation (HCC)    a.) CHA2DS2-VASc = 7 (age, sex, HFrEF, HTN, TIA x 2, T2DM). b.) rate/rhythm controlled on oral carvedilol; chronically anticoagulated with warfarin.   . Cardiac murmur   . CHF (congestive heart failure) (HCC)   . Chronic anticoagulation    a.) warfarin  . 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 Coronary artery disease    a.) 3v CABG 01/28/2004. b.) R/LHC 03/29/2017: small RCA with occluded SVG; no significant Dz. Chronically occluded LAD with patent SVG to D1/LAD. Insignificant Dz in LCx. LM normal.  . Cortical cataract   . DOE (dyspnea on exertion)   . DOE (dyspnea on exertion)   . GERD (gastroesophageal reflux disease)   . HFrEF (heart failure with reduced ejection fraction) (HCC)    a.) TTE 10/27/2014: EF 40%; glob HK; mild BAE; triv AR, mild MR/PR, mod TR.  b.) TTE 03/15/2017: EF 30%; glob HK; mod BAE; mod pHTN (RVSP 54.8 mmHg); mild PR, mod MR; sev TR.  c.) R/LHC 03/29/2017: EF 30-35%. d.)TTE 10/25/2018: EF 35%; mod BAE; mod BVE; glob HK; triv PR, mod MR, sev TR; RVSP 57.7 mmHg; G2DD.  Marland Kitchen History of shingles 2004  . Hyperlipidemia   . Hypertension   . Hypothyroidism   . IBS (irritable bowel syndrome)   . Ischemic cardiomyopathy    a.) TTE 10/27/2014: EF 40%. b.) TTE 03/15/2017: EF 30%. c.) R/LHC 04/01/2017: EF 30-35%. d.) TTE 10/25/2018: EF 35%  . Lumbar scoliosis   . Lumbar spinal stenosis   . Migraines   . Osteoporosis   . Pulmonary HTN (HCC)    a.) TTE 03/15/2017: EF 30-35%; RVSP 54.8 mmHg.  b.) R/LHC 03/29/2017: mean PA 33 mmHg, PCWP 22 mmHg, LVEDP 14 mmHg, mean AO 79 mmHg; CO 7.89 L/min, CI 4.61 L/min/m  . S/P CABG x 3 01/28/2004  . Sleep apnea   . T2DM (type 2 diabetes mellitus) (HCC)   . TIA (transient ischemic attack) 05/29/2016  . Vitamin B 12 deficiency    Past Surgical History:  Procedure Laterality Date  . CARDIAC CATHETERIZATION    . CATARACT EXTRACTION W/ INTRAOCULAR LENS IMPLANT Bilateral    Cataract Extraction with IOL  . COLONOSCOPY N/A 12/19/2018   Procedure: COLONOSCOPY;  Surgeon: Pasty Spillers, MD;  Location: [facility] ENDOSCOPY;  Service: Endoscopy;  Laterality: N/A;  . COLONOSCOPY WITH  PROPOFOL N/A 06/17/2020   Procedure: COLONOSCOPY WITH PROPOFOL;  Surgeon: Pasty Spillers, MD;  Location: [facility] ENDOSCOPY;  Service: Endoscopy;  Laterality: N/A;  . COLONOSCOPY WITH PROPOFOL N/A 03/30/2023   Procedure: COLONOSCOPY WITH PROPOFOL;  Surgeon: Jaynie Collins, DO;  Location: [facility] ENDOSCOPY;  Service: Endoscopy;  Laterality: N/A;  . COR TRIATRIATUM REPAIR N/A 01/28/2004  . CORONARY ARTERY BYPASS GRAFT N/A 01/28/2004   3v CABG  . ESOPHAGOGASTRODUODENOSCOPY N/A 12/19/2018   Procedure: ESOPHAGOGASTRODUODENOSCOPY (EGD);  Surgeon: Pasty Spillers, MD;  Location: [facility] ENDOSCOPY;  Service: Endoscopy;  Laterality: N/A;  . ESOPHAGOGASTRODUODENOSCOPY (EGD) WITH PROPOFOL N/A 06/17/2020   Procedure: ESOPHAGOGASTRODUODENOSCOPY (EGD) WITH PROPOFOL;  Surgeon: Pasty Spillers, MD;  Location: [facility] ENDOSCOPY;  Service: Endoscopy;  Laterality: N/A;  . ESOPHAGOGASTRODUODENOSCOPY (EGD) WITH PROPOFOL N/A 03/30/2023   Procedure: ESOPHAGOGASTRODUODENOSCOPY (EGD) WITH PROPOFOL;  Surgeon: Jaynie Collins, DO;  Location: [facility] ENDOSCOPY;  Service: Endoscopy;  Laterality: N/A;  . RIGHT/LEFT HEART CATH AND CORONARY ANGIOGRAPHY Bilateral 03/29/2017   Procedure: Right/Left Heart Cath and Coronary Angiography;  Surgeon: Dalia Heading, MD;  Location: [facility] INVASIVE CV LAB;  Service: Cardiovascular;  Laterality: Bilateral;  . TOTAL KNEE ARTHROPLASTY Right 04/05/2022   Procedure: TOTAL KNEE ARTHROPLASTY;  Surgeon: Kennedy Bucker, MD;  Location: [facility] ORS;  Service: Orthopedics;  Laterality: Right;  . TUBAL LIGATION    . VENTRAL HERNIA REPAIR N/A 04/21/2017   Procedure: HERNIA REPAIR VENTRAL ADULT;  Surgeon: Nadeen Landau, MD;  Location: [facility] ORS;  Service: General;  Laterality: N/A;   Social History:  reports that she quit smoking about 24 years ago. Her smoking use included cigarettes. She has never used smokeless tobacco. She reports that she does not drink alcohol and does not use  drugs.  Allergies  Allergen Reactions  . Vioxx [Rofecoxib] Swelling    Family History  Problem Relation Age of Onset  . Valvular heart disease Mother   . Diabetes Father   . Cancer Sister        lung  . Cancer Sister        cervical  . Breast cancer Cousin     Prior to Admission medications   Medication Sig Start Date End Date Taking? Authorizing Provider  albuterol (PROVENTIL HFA;VENTOLIN HFA) 108 (90 Base) MCG/ACT inhaler Inhale 2 puffs into the lungs every 6 (six) hours as needed for wheezing or shortness of breath.    [provider]  aspirin EC 81 MG tablet Take 81 mg by mouth daily.    [provider]  carvedilol (COREG) 6.25 MG tablet Take by mouth. 01/06/23   [provider]  cyclobenzaprine (FLEXERIL) 5 MG tablet Take by mouth. Patient not taking: Reported on 01/20/2023 12/28/22   [provider]  diclofenac Sodium (VOLTAREN) 1 % GEL Apply 1 application. topically  4 (four) times daily as needed (pain).    [provider]  docusate sodium (COLACE) 100 MG capsule Take 1 capsule (100 mg total) by mouth 2 (two) times daily. Patient not taking: Reported on 11/02/2022 04/07/22   Evon Slack, PA-C  ELIQUIS 5 MG TABS tablet Take 5 mg by mouth 2 (two) times daily.    [provider]  ENTRESTO 24-26 MG Take 1 tablet by mouth 2 (two) times daily. Patient not taking: Reported on 01/20/2023 01/27/21   [provider]  fluticasone (FLONASE) 50 MCG/ACT nasal spray Place 1 spray into both nostrils at bedtime. 02/05/19   [provider]  furosemide (LASIX) 40 MG tablet Take 80 mg by mouth daily.     [provider]  HYDROcodone-acetaminophen (NORCO) 7.5-325 MG tablet Take 1-2 tablets by mouth every 4 (four) hours as needed for severe pain (pain score 7-10). Patient not taking: Reported on 11/02/2022 04/07/22   Evon Slack, PA-C  latanoprost (XALATAN) 0.005 % ophthalmic solution Place 1 drop into both eyes at  bedtime.    [provider]  levothyroxine (SYNTHROID) 50 MCG tablet Take 50 mcg by mouth daily before breakfast.    [provider]  lovastatin (MEVACOR) 10 MG tablet Take 10 mg by mouth at bedtime.    [provider]  metFORMIN (GLUCOPHAGE) 1000 MG tablet Take 1,000 mg by mouth 2 (two) times daily with a meal.    [provider]  methocarbamol (ROBAXIN) 500 MG tablet Take 1 tablet (500 mg total) by mouth every 6 (six) hours as needed for muscle spasms. Patient not taking: Reported on 11/02/2022 04/07/22   Evon Slack, PA-C  ondansetron (ZOFRAN) 4 MG tablet Take 1 tablet (4 mg total) by mouth every 6 (six) hours as needed for nausea. Patient not taking: Reported on 04/22/2022 04/07/22   Evon Slack, PA-C  [facility] ULTRA test strip  10/15/22   [provider]  pantoprazole (PROTONIX) 40 MG tablet Take 40 mg by mouth daily. 01/28/21   [provider]  polyethylene glycol (MIRALAX / GLYCOLAX) 17 g packet Take 17 g by mouth daily as needed for mild constipation. Patient not taking: Reported on 11/02/2022 04/07/22   Evon Slack, PA-C  polyethylene glycol-electrolytes (NULYTELY) 420 g solution Take by mouth. Patient not taking: Reported on 03/30/2023    [provider]  predniSONE (DELTASONE) 10 MG tablet Take by mouth. Patient not taking: Reported on 01/20/2023 12/28/22   [provider]  sacubitril-valsartan (ENTRESTO) 49-51 MG Take 1 tablet by mouth 2 (two) times daily.    [provider]  spironolactone (ALDACTONE) 25 MG tablet Take 25 mg by mouth daily.    [provider]  traMADol (ULTRAM) 50 MG tablet Take 1 tablet (50 mg total) by mouth every 6 (six) hours as needed. Patient not taking: Reported on 01/20/2023 04/07/22   Evon Slack, PA-C  vitamin B-12 (CYANOCOBALAMIN) 1000 MCG tablet Take 1,000 mcg by mouth daily.    [provider]  warfarin (COUMADIN) 3 MG tablet Take 3-4.5 mg by mouth  See admin instructions. 4.5 mg on Monday, 3 mg all other days Patient not taking: Reported on 01/20/2023    [provider]    Physical Exam: Vitals:   06/06/23 2016 06/06/23 2145 06/06/23 2300 06/06/23 2330  BP:  (!) 113/49 (!) 104/52 (!) 101/59  Pulse:  75 63 68  Resp:  15 14 18   Temp:      TempSrc:  SpO2:  100% 98% 97%  Weight: 65.8 kg     Height: 5\' 1"  (1.549 m)      Physical Exam  Labs on Admission: I have personally reviewed following labs and imaging studies  CBC: Recent Labs  Lab 06/06/23 2026  WBC 3.2*  NEUTROABS 2.1  HGB 12.3  HCT 37.5  MCV 102.2*  PLT 108*   Basic Metabolic Panel: Recent Labs  Lab 06/06/23 2026  NA 138  K 3.5  CL 98  CO2 26  GLUCOSE 98  BUN 27*  CREATININE 1.15*  CALCIUM 9.7   GFR: Estimated Creatinine Clearance: 40.7 mL/min (A) (by C-G formula based on SCr of 1.15 mg/dL (H)). Liver Function Tests: No results for input(s): "AST", "ALT", "ALKPHOS", "BILITOT", "PROT", "ALBUMIN" in the last 168 hours. No results for input(s): "LIPASE", "AMYLASE" in the last 168 hours. No results for input(s): "AMMONIA" in the last 168 hours. Coagulation Profile: Recent Labs  Lab 06/06/23 2026  INR 1.3*   Cardiac Enzymes: No results for input(s): "CKTOTAL", "CKMB", "CKMBINDEX", "TROPONINI" in the last 168 hours. BNP (last 3 results) No results for input(s): "PROBNP" in the last 8760 hours. HbA1C: No results for input(s): "HGBA1C" in the last 72 hours. CBG: No results for input(s): "GLUCAP" in the last 168 hours. Lipid Profile: No results for input(s): "CHOL", "HDL", "LDLCALC", "TRIG", "CHOLHDL", "LDLDIRECT" in the last 72 hours. Thyroid Function Tests: No results for input(s): "TSH", "T4TOTAL", "FREET4", "T3FREE", "THYROIDAB" in the last 72 hours. Anemia Panel: No results for input(s): "VITAMINB12", "FOLATE", "FERRITIN", "TIBC", "IRON", "RETICCTPCT" in the last 72 hours. Urine analysis:    Component Value Date/Time    COLORURINE STRAW (A) 03/29/2022 0951   APPEARANCEUR CLEAR (A) 03/29/2022 0951   LABSPEC 1.005 03/29/2022 0951   PHURINE 6.0 03/29/2022 0951   GLUCOSEU NEGATIVE 03/29/2022 0951   HGBUR NEGATIVE 03/29/2022 0951   BILIRUBINUR NEGATIVE 03/29/2022 0951   KETONESUR NEGATIVE 03/29/2022 0951   PROTEINUR NEGATIVE 03/29/2022 0951   NITRITE NEGATIVE 03/29/2022 0951   LEUKOCYTESUR NEGATIVE 03/29/2022 0951    Radiological Exams on Admission: No results found.   Data Reviewed: Relevant notes from primary care and specialist visits, past discharge summaries as available in EHR, including Care Everywhere. Prior diagnostic testing as pertinent to current admission diagnoses Updated medications and problem lists for reconciliation ED course, including vitals, labs, imaging, treatment and response to treatment Triage notes, nursing and pharmacy notes and ED provider's notes Notable results as noted in HPI   Assessment and Plan: No notes have been filed under this hospital service. Service: Hospitalist       DVT prophylaxis: Lovenox***  Consults: none***  Advance Care Planning:   Code Status: Prior ***  Family Communication: none***  Disposition Plan: Back to previous home environment  Severity of Illness: {Observation/Inpatient:21159}  Author: Andris Baumann, MD 06/06/2023 11:51 PM  For on call review [email or web address].

## 2023-06-06 NOTE — ED Notes (Signed)
Pt attempted to void to give a sample and she states that she was not able to void but frank red blood began pouring out, she is unsure if it is coming from her vagina or rectum or bladder.  Pt acuity upgraded and pt given a WC

## 2023-06-06 NOTE — H&P (Addendum)
History and Physical    Patient: Tammy Arias ZOX:[id number] DOB: 02-13-1956 DOA: 06/06/2023 DOS: the patient was seen and examined on 06/06/2023 PCP: Jerl Mina, MD  Patient coming from: Home  Chief Complaint:  Chief Complaint  Patient presents with   Hematuria    HPI: Tammy Arias is a 67 y.o. 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  She is unsure with what the bleeding came from.  As she stood up the blood was running down her legs.  She was previously in her usual state of health.  She denies chest pain shortness or lightheadedness.  Denies abdominal pain.  Has no nausea or vomiting. ED course and data review: Soft blood pressures down to 101/59 with otherwise normal vitals.  Labs with hemoglobin 12.3, platelets108 down from 80-90 a few months prior, WBC 3.2 about the same as the last few months INR 1.3.  Creatinine at baseline at 1.15.  Physical exam done in the ED revealed bright red blood in the rectal vault and none on vaginal exam Patient was treated with fluid bolus and CT angiogram abdomen and pelvis ordered.  Hospitalist consulted for admission.   Review of Systems: As mentioned in the history of present illness. All other systems reviewed and are negative.  Past Medical History:  Diagnosis Date   AC (acromioclavicular) joint bone spurs, right    Anemia    Arthritis    Atrial fibrillation (HCC)    a.) CHA2DS2-VASc = 7 (age, sex, HFrEF, HTN, TIA x 2, T2DM). b.) rate/rhythm controlled on oral carvedilol; chronically anticoagulated with warfarin.    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 b.) R/LHC 03/29/2017: small RCA with occluded SVG; no significant Dz. Chronically occluded LAD with patent SVG to D1/LAD. Insignificant Dz in LCx. LM normal.   Cortical cataract    DOE (dyspnea on exertion)    DOE (dyspnea on exertion)    GERD (gastroesophageal reflux disease)    HFrEF (heart failure with reduced ejection fraction) (HCC)    a.) TTE 10/27/2014: EF 40%; glob HK; mild BAE; triv AR, mild MR/PR, mod TR.  b.) TTE 03/15/2017: EF 30%; glob HK; mod BAE; mod pHTN (RVSP 54.8 mmHg); mild PR, mod MR; sev TR.  c.) R/LHC 03/29/2017: EF 30-35%. d.)TTE 10/25/2018: EF 35%; mod BAE; mod BVE; glob HK; triv PR, mod MR, sev TR; RVSP 57.7 mmHg; G2DD.   History of shingles 2004   Hyperlipidemia    Hypertension    Hypothyroidism    IBS (irritable bowel syndrome)    Ischemic cardiomyopathy    a.) TTE 10/27/2014: EF 40%. b.) TTE 03/15/2017: EF 30%. c.) R/LHC 04/01/2017: EF 30-35%. d.) TTE 10/25/2018: EF 35%   Lumbar scoliosis    Lumbar spinal stenosis    Migraines    Osteoporosis    Pulmonary HTN (HCC)    a.) TTE 03/15/2017: EF 30-35%; RVSP 54.8 mmHg.  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   Sleep apnea    T2DM (type 2 diabetes mellitus) (HCC)    TIA (transient ischemic attack) 05/29/2016   Vitamin B 12 deficiency    Past Surgical History:  Procedure Laterality Date   CARDIAC CATHETERIZATION     CATARACT EXTRACTION W/ INTRAOCULAR LENS IMPLANT Bilateral    Cataract Extraction with IOL   COLONOSCOPY N/A 12/19/2018   Procedure: COLONOSCOPY;  Surgeon: Pasty Spillers, MD;  Location: [facility] ENDOSCOPY;  Service: Endoscopy;  Laterality: N/A;   COLONOSCOPY WITH PROPOFOL N/A 06/17/2020   Procedure:  COLONOSCOPY WITH PROPOFOL;  Surgeon: Pasty Spillers, MD;  Location: [facility] ENDOSCOPY;  Service: Endoscopy;  Laterality: N/A;   COLONOSCOPY WITH PROPOFOL N/A 03/30/2023   Procedure: COLONOSCOPY WITH PROPOFOL;  Surgeon: Jaynie Collins, DO;  Location: [facility] ENDOSCOPY;  Service: Endoscopy;  Laterality: N/A;   COR TRIATRIATUM REPAIR N/A 01/28/2004   CORONARY ARTERY BYPASS GRAFT N/A 01/28/2004   3v CABG   ESOPHAGOGASTRODUODENOSCOPY N/A 12/19/2018   Procedure: ESOPHAGOGASTRODUODENOSCOPY (EGD);  Surgeon: Pasty Spillers, MD;  Location: [facility] ENDOSCOPY;  Service: Endoscopy;  Laterality: N/A;   ESOPHAGOGASTRODUODENOSCOPY (EGD) WITH PROPOFOL N/A 06/17/2020   Procedure: ESOPHAGOGASTRODUODENOSCOPY (EGD) WITH PROPOFOL;  Surgeon: Pasty Spillers, MD;  Location: [facility] ENDOSCOPY;  Service: Endoscopy;  Laterality: N/A;   ESOPHAGOGASTRODUODENOSCOPY (EGD) WITH PROPOFOL N/A 03/30/2023   Procedure: ESOPHAGOGASTRODUODENOSCOPY (EGD) WITH PROPOFOL;  Surgeon: Jaynie Collins, DO;  Location: [facility] ENDOSCOPY;  Service: Endoscopy;  Laterality: N/A;   RIGHT/LEFT HEART CATH AND CORONARY ANGIOGRAPHY Bilateral 03/29/2017   Procedure: Right/Left Heart Cath and Coronary Angiography;  Surgeon: Dalia Heading, MD;  Location: [facility] INVASIVE CV LAB;  Service: Cardiovascular;  Laterality: Bilateral;   TOTAL KNEE ARTHROPLASTY Right 04/05/2022   Procedure: TOTAL KNEE ARTHROPLASTY;  Surgeon: Kennedy Bucker, MD;  Location: [facility] ORS;  Service: Orthopedics;  Laterality: Right;   TUBAL LIGATION     VENTRAL HERNIA REPAIR N/A 04/21/2017   Procedure: HERNIA REPAIR VENTRAL ADULT;  Surgeon: Nadeen Landau, MD;  Location: [facility] ORS;  Service: General;  Laterality: N/A;   Social History:  reports that she quit smoking about 24 years ago. Her smoking use included cigarettes. She has never used smokeless tobacco. She reports that she does not drink alcohol and does not use drugs.  Allergies  Allergen Reactions   Vioxx  [Rofecoxib] Swelling    Family History  Problem Relation Age of Onset   Valvular heart disease Mother    Diabetes Father    Cancer Sister        lung   Cancer Sister        cervical   Breast cancer Cousin     Prior to Admission medications   Medication Sig Start Date End Date Taking? Authorizing Provider  albuterol (PROVENTIL HFA;VENTOLIN HFA) 108 (90 Base) MCG/ACT inhaler Inhale 2 puffs into the lungs every 6 (six) hours as needed for wheezing or shortness of breath.    [provider]  aspirin EC 81 MG tablet Take 81 mg by mouth daily.    [provider]  carvedilol (COREG) 6.25 MG tablet Take by mouth. 01/06/23   [provider]  cyclobenzaprine (FLEXERIL) 5 MG tablet Take by mouth. Patient not taking: Reported on 01/20/2023 12/28/22   [provider]  diclofenac Sodium (VOLTAREN) 1 % GEL Apply 1 application. topically  4 (four) times daily as needed (pain).    [provider]  docusate sodium (COLACE) 100 MG capsule Take 1 capsule (100 mg total) by mouth 2 (two) times daily. Patient not taking: Reported on 11/02/2022 04/07/22   Evon Slack, PA-C  ELIQUIS 5 MG TABS tablet Take 5 mg by mouth 2 (two) times daily.    [provider]  ENTRESTO 24-26 MG Take 1 tablet by mouth 2 (two) times daily. Patient not taking: Reported on 01/20/2023 01/27/21   [provider]  fluticasone (FLONASE) 50 MCG/ACT nasal spray Place 1 spray into both nostrils at bedtime. 02/05/19   [provider]  furosemide (LASIX) 40 MG tablet Take 80 mg by mouth daily.     [provider]  HYDROcodone-acetaminophen (NORCO) 7.5-325 MG tablet Take 1-2 tablets by mouth every 4 (four) hours as needed for severe pain (pain score 7-10). Patient not taking: Reported on 11/02/2022 04/07/22   Evon Slack, PA-C  latanoprost (XALATAN) 0.005 % ophthalmic solution Place 1 drop into both eyes at bedtime.    [provider]  levothyroxine  (SYNTHROID) 50 MCG tablet Take 50 mcg by mouth daily before breakfast.    [provider]  lovastatin (MEVACOR) 10 MG tablet Take 10 mg by mouth at bedtime.    [provider]  metFORMIN (GLUCOPHAGE) 1000 MG tablet Take 1,000 mg by mouth 2 (two) times daily with a meal.    [provider]  methocarbamol (ROBAXIN) 500 MG tablet Take 1 tablet (500 mg total) by mouth every 6 (six) hours as needed for muscle spasms. Patient not taking: Reported on 11/02/2022 04/07/22   Evon Slack, PA-C  ondansetron (ZOFRAN) 4 MG tablet Take 1 tablet (4 mg total) by mouth every 6 (six) hours as needed for nausea. Patient not taking: Reported on 04/22/2022 04/07/22   Evon Slack, PA-C  [facility] ULTRA test strip  10/15/22   [provider]  pantoprazole (PROTONIX) 40 MG tablet Take 40 mg by mouth daily. 01/28/21   [provider]  polyethylene glycol (MIRALAX / GLYCOLAX) 17 g packet Take 17 g by mouth daily as needed for mild constipation. Patient not taking: Reported on 11/02/2022 04/07/22   Evon Slack, PA-C  polyethylene glycol-electrolytes (NULYTELY) 420 g solution Take by mouth. Patient not taking: Reported on 03/30/2023    [provider]  predniSONE (DELTASONE) 10 MG tablet Take by mouth. Patient not taking: Reported on 01/20/2023 12/28/22   [provider]  sacubitril-valsartan (ENTRESTO) 49-51 MG Take 1 tablet by mouth 2 (two) times daily.    [provider]  spironolactone (ALDACTONE) 25 MG tablet Take 25 mg by mouth daily.    [provider]  traMADol (ULTRAM) 50 MG tablet Take 1 tablet (50 mg total) by mouth every 6 (six) hours as needed. Patient not taking: Reported on 01/20/2023 04/07/22   Evon Slack, PA-C  vitamin B-12 (CYANOCOBALAMIN) 1000 MCG tablet Take 1,000 mcg by mouth daily.    [provider]  warfarin (COUMADIN) 3 MG tablet Take 3-4.5 mg by mouth See admin instructions. 4.5 mg on Monday, 3 mg all  other days Patient not taking: Reported on 01/20/2023    [provider]    Physical Exam: Vitals:   06/06/23 2016 06/06/23 2145 06/06/23 2300 06/06/23 2330  BP:  (!) 113/49 (!) 104/52 (!) 101/59  Pulse:  75 63 68  Resp:  15 14 18   Temp:      TempSrc:  SpO2:  100% 98% 97%  Weight: 65.8 kg     Height: 5\' 1"  (1.549 m)      Physical Exam Vitals and nursing note reviewed.  Constitutional:      General: She is not in acute distress. HENT:     Head: Normocephalic and atraumatic.  Cardiovascular:     Rate and Rhythm: Normal rate and regular rhythm.     Heart sounds: Normal heart sounds.  Pulmonary:     Effort: Pulmonary effort is normal.     Breath sounds: Normal breath sounds.  Abdominal:     Palpations: Abdomen is soft.     Tenderness: There is no abdominal tenderness.  Neurological:     Mental Status: Mental status is at baseline.     Labs on Admission: I have personally reviewed following labs and imaging studies  CBC: Recent Labs  Lab 06/06/23 2026  WBC 3.2*  NEUTROABS 2.1  HGB 12.3  HCT 37.5  MCV 102.2*  PLT 108*   Basic Metabolic Panel: Recent Labs  Lab 06/06/23 2026  NA 138  K 3.5  CL 98  CO2 26  GLUCOSE 98  BUN 27*  CREATININE 1.15*  CALCIUM 9.7   GFR: Estimated Creatinine Clearance: 40.7 mL/min (A) (by C-G formula based on SCr of 1.15 mg/dL (H)). Liver Function Tests: No results for input(s): "AST", "ALT", "ALKPHOS", "BILITOT", "PROT", "ALBUMIN" in the last 168 hours. No results for input(s): "LIPASE", "AMYLASE" in the last 168 hours. No results for input(s): "AMMONIA" in the last 168 hours. Coagulation Profile: Recent Labs  Lab 06/06/23 2026  INR 1.3*   Cardiac Enzymes: No results for input(s): "CKTOTAL", "CKMB", "CKMBINDEX", "TROPONINI" in the last 168 hours. BNP (last 3 results) No results for input(s): "PROBNP" in the last 8760 hours. HbA1C: No results for input(s): "HGBA1C" in the last 72 hours. CBG: No results for  input(s): "GLUCAP" in the last 168 hours. Lipid Profile: No results for input(s): "CHOL", "HDL", "LDLCALC", "TRIG", "CHOLHDL", "LDLDIRECT" in the last 72 hours. Thyroid Function Tests: No results for input(s): "TSH", "T4TOTAL", "FREET4", "T3FREE", "THYROIDAB" in the last 72 hours. Anemia Panel: No results for input(s): "VITAMINB12", "FOLATE", "FERRITIN", "TIBC", "IRON", "RETICCTPCT" in the last 72 hours. Urine analysis:    Component Value Date/Time   COLORURINE STRAW (A) 03/29/2022 0951   APPEARANCEUR CLEAR (A) 03/29/2022 0951   LABSPEC 1.005 03/29/2022 0951   PHURINE 6.0 03/29/2022 0951   GLUCOSEU NEGATIVE 03/29/2022 0951   HGBUR NEGATIVE 03/29/2022 0951   BILIRUBINUR NEGATIVE 03/29/2022 0951   KETONESUR NEGATIVE 03/29/2022 0951   PROTEINUR NEGATIVE 03/29/2022 0951   NITRITE NEGATIVE 03/29/2022 0951   LEUKOCYTESUR NEGATIVE 03/29/2022 0951    Radiological Exams on Admission: No results found.   Data Reviewed: Relevant notes from primary care and specialist visits, past discharge summaries as available in EHR, including Care Everywhere. Prior diagnostic testing as pertinent to current admission diagnoses Updated medications and problem lists for reconciliation ED course, including vitals, labs, imaging, treatment and response to treatment Triage notes, nursing and pharmacy notes and ED provider's notes Notable results as noted in HPI   Assessment and Plan: * Acute GI bleeding History of gastric and colonic angiectasia Chronic anticoagulation/thrombocytopenia Patient presents with bright red blood per rectum, hemodynamically stable albeit with soft blood pressures Hold Eliquis Follow-up CTA Patient is s/p EGD and colonoscopy May 2024 --> patient states 5 polyps were removed GI consult Serial H&H and transfuse if needed IV hydration Close hemodynamic monitoring  Thrombocytopenia (HCC) 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  OSA on CPAP CPAP nightly  Type 2 diabetes mellitus (HCC) Sliding scale insulin coverage  Hypertension Hypotension Holding antihypertensives IV hydration to ensure MAP staying over 65 Monitor for fluid overload    DVT prophylaxis: SCD  Consults: GI, Dr. Servando Snare  Advance Care Planning:   Code Status: Prior   Family Communication: none  Disposition Plan: Back to previous home environment  Severity of Illness: The appropriate patient status for this patient is INPATIENT. Inpatient status is judged to be reasonable and necessary in order to provide the required intensity of service to ensure the patient's safety. The patient's presenting symptoms, physical exam findings, and initial radiographic and laboratory data in the context of their chronic comorbidities is felt to place them at high risk for further clinical deterioration. Furthermore, it is not anticipated that the patient will be medically stable for discharge from the hospital within 2 midnights of admission.   * I certify that at the point of admission it is my clinical judgment that the patient will require inpatient hospital care spanning beyond 2 midnights from the point of admission due to high intensity of service, high risk for further deterioration and high frequency of surveillance required.*  Author: Andris Baumann, MD 06/06/2023 11:51 PM  For on call review [email or web address].

## 2023-06-06 NOTE — ED Provider Notes (Incomplete)
[facility] Provider Note    Event Date/Time   First MD Initiated Contact with Patient 06/06/23 2138     (approximate)   History   Hematuria   HPI  Tammy Arias is a 67 y.o. female  ***       Physical Exam   Triage Vital Signs: ED Triage Vitals  Enc Vitals Group     BP 06/06/23 2015 (!) 120/52     Pulse Rate 06/06/23 2015 82     Resp 06/06/23 2015 14     Temp 06/06/23 2015 98.2 F (36.8 C)     Temp Source 06/06/23 2015 Oral     SpO2 06/06/23 2015 97 %     Weight 06/06/23 2016 145 lb (65.8 kg)     Height 06/06/23 2016 5\' 1"  (1.549 m)     Head Circumference --      Peak Flow --      Pain Score 06/06/23 2016 0     Pain Loc --      Pain Edu? --      Excl. in GC? --     Most recent vital signs: Vitals:   06/06/23 2015  BP: (!) 120/52  Pulse: 82  Resp: 14  Temp: 98.2 F (36.8 C)  SpO2: 97%     General: Awake, no distress. *** CV:  Good peripheral perfusion. *** Resp:  Normal work of breathing. *** Abd:  No distention. *** Other:  ***   ED Results / Procedures / Treatments   Labs (all labs ordered are listed, but only abnormal results are displayed) Labs Reviewed  CBC WITH DIFFERENTIAL/PLATELET - Abnormal; Notable for the following components:      Result Value   WBC 3.2 (*)    RBC 3.67 (*)    MCV 102.2 (*)    Platelets 108 (*)    Lymphs Abs 0.5 (*)    All other components within normal limits  BASIC METABOLIC PANEL - Abnormal; Notable for the following components:   BUN 27 (*)    Creatinine, Ser 1.15 (*)    GFR, Estimated 52 (*)    All other components within normal limits  PROTIME-INR - Abnormal; Notable for the following components:   Prothrombin Time 16.2 (*)    INR 1.3 (*)    All other components within normal limits  URINALYSIS, ROUTINE W REFLEX MICROSCOPIC  TYPE AND SCREEN     EKG ***   RADIOLOGY ***   ***I also independently reviewed and agree with radiologist  interpretations.   PROCEDURES:  Critical Care performed: {CriticalCareYesNo:19197::"Yes, see critical care procedure note(s)","No"}  ***   MEDICATIONS ORDERED IN ED: Medications - No data to display   IMPRESSION / MDM / ASSESSMENT AND PLAN / ED COURSE  I reviewed the triage vital signs and the nursing notes.                              Differential diagnosis includes, but is not limited to, ***  Patient's presentation is most consistent with {EM COPA:27473}  {If the patient is on the monitor, remove the brackets and asterisks on the sentence below and remember to document it as a Procedure as well. Otherwise delete the sentence below:1} ***The patient is on the cardiac monitor to evaluate for evidence of arrhythmia and/or significant heart rate changes      FINAL CLINICAL IMPRESSION(S) / ED DIAGNOSES   Final diagnoses:  None     Rx / DC Orders   ED Discharge Orders     None        Note:  This document was prepared using Dragon voice recognition software and may include unintentional dictation errors.

## 2023-06-06 NOTE — ED Triage Notes (Signed)
Pt is on elaquis and had gross hematuria about an hour ago and she states that when she was getting ready bright red blood was running out of her, she is unsure if it was from her bladder or vagina.  No dizziness or weakness with this.

## 2023-06-06 NOTE — ED Provider Notes (Signed)
[facility] Provider Note    Event Date/Time   First MD Initiated Contact with Patient 06/06/23 2138     (approximate)   History   Hematuria   HPI  Tammy Arias is a 67 y.o. female  here with bright red blood in toilet bowel. Pt reports that over the past 24 hours she has had significant bleeding. She sat down to pee earlier today and felt a small amount of blood trickling down her leg.  This is occurred twice.  She is not sure whether this came from her rectum or vagina or urine.  Denies any urine symptoms.  Denies any history of vaginal bleeding.  She is on Eliquis and did take her dose tonight.  She has a history of diverticulosis.  No other complaints.     Physical Exam   Triage Vital Signs: ED Triage Vitals  Enc Vitals Group     BP 06/06/23 2015 (!) 120/52     Pulse Rate 06/06/23 2015 82     Resp 06/06/23 2015 14     Temp 06/06/23 2015 98.2 F (36.8 C)     Temp Source 06/06/23 2015 Oral     SpO2 06/06/23 2015 97 %     Weight 06/06/23 2016 145 lb (65.8 kg)     Height 06/06/23 2016 5\' 1"  (1.549 m)     Head Circumference --      Peak Flow --      Pain Score 06/06/23 2016 0     Pain Loc --      Pain Edu? --      Excl. in GC? --     Most recent vital signs: Vitals:   06/06/23 2300 06/06/23 2330  BP: (!) 104/52 (!) 101/59  Pulse: 63 68  Resp: 14 18  Temp:    SpO2: 98% 97%     General: Awake, no distress.  CV:  Good peripheral perfusion.  Resp:  Normal work of breathing.  Abd:  No distention.  No significant tenderness.  No rebound or guarding. Other:  Rectal exam performed with moderate amount of maroon and grossly bloody stool in the rectal vault.  Vaginal exam shows no significant vaginal bleeding.  No noted blood from the urethra.   ED Results / Procedures / Treatments   Labs (all labs ordered are listed, but only abnormal results are displayed) Labs Reviewed  CBC WITH DIFFERENTIAL/PLATELET - Abnormal; Notable for the  following components:      Result Value   WBC 3.2 (*)    RBC 3.67 (*)    MCV 102.2 (*)    Platelets 108 (*)    Lymphs Abs 0.5 (*)    All other components within normal limits  BASIC METABOLIC PANEL - Abnormal; Notable for the following components:   BUN 27 (*)    Creatinine, Ser 1.15 (*)    GFR, Estimated 52 (*)    All other components within normal limits  PROTIME-INR - Abnormal; Notable for the following components:   Prothrombin Time 16.2 (*)    INR 1.3 (*)    All other components within normal limits  URINALYSIS, ROUTINE W REFLEX MICROSCOPIC  TYPE AND SCREEN     EKG    RADIOLOGY CT Pending   I also independently reviewed and agree with radiologist interpretations.   PROCEDURES:  Critical Care performed: No   MEDICATIONS ORDERED IN ED: Medications  0.9 %  sodium chloride infusion (has no administration in time range)  iohexol (OMNIPAQUE) 350 MG/ML injection 100 mL (100 mLs Intravenous Contrast Given 06/06/23 2333)  sodium chloride 0.9 % bolus 500 mL (500 mLs Intravenous New Bag/Given 06/06/23 2345)     IMPRESSION / MDM / ASSESSMENT AND PLAN / ED COURSE  I reviewed the triage vital signs and the nursing notes.                              Differential diagnosis includes, but is not limited to, LGIB, diverticulosis, AVM, possible vaginal or urethral bleeding/hematuria  Patient's presentation is most consistent with acute presentation with potential threat to life or bodily function.  The patient is on the cardiac monitor to evaluate for evidence of arrhythmia and/or significant heart rate changes  67 year old female with history of A-fib on Eliquis, diverticulosis, here with bleeding.  Clinically, suspect rectal bleeding based on exam.  No evidence of vaginal bleeding or periurethral bleeding.  She is hemodynamically stable.  Hemoglobin is stable at 12.3.  BMP unremarkable.  Patient did take her Eliquis today.  Will send for CT angio given unclear etiology, plan  to admit for management given her anticoagulant use and ongoing bleeding.   FINAL CLINICAL IMPRESSION(S) / ED DIAGNOSES   Final diagnoses:  Lower GI bleed     Rx / DC Orders   ED Discharge Orders     None        Note:  This document was prepared using Dragon voice recognition software and may include unintentional dictation errors.   Shaune Pollack, MD 06/06/23 [phone]

## 2023-06-07 ENCOUNTER — Inpatient Hospital Stay: Payer: Medicare Other

## 2023-06-07 DIAGNOSIS — D696 Thrombocytopenia, unspecified: Secondary | ICD-10-CM | POA: Diagnosis present

## 2023-06-07 DIAGNOSIS — K5731 Diverticulosis of large intestine without perforation or abscess with bleeding: Secondary | ICD-10-CM | POA: Diagnosis present

## 2023-06-07 DIAGNOSIS — I110 Hypertensive heart disease with heart failure: Secondary | ICD-10-CM | POA: Diagnosis present

## 2023-06-07 DIAGNOSIS — I2510 Atherosclerotic heart disease of native coronary artery without angina pectoris: Secondary | ICD-10-CM | POA: Diagnosis present

## 2023-06-07 DIAGNOSIS — G4733 Obstructive sleep apnea (adult) (pediatric): Secondary | ICD-10-CM | POA: Diagnosis present

## 2023-06-07 DIAGNOSIS — I255 Ischemic cardiomyopathy: Secondary | ICD-10-CM | POA: Diagnosis present

## 2023-06-07 DIAGNOSIS — K766 Portal hypertension: Secondary | ICD-10-CM | POA: Diagnosis present

## 2023-06-07 DIAGNOSIS — K922 Gastrointestinal hemorrhage, unspecified: Secondary | ICD-10-CM

## 2023-06-07 DIAGNOSIS — M810 Age-related osteoporosis without current pathological fracture: Secondary | ICD-10-CM | POA: Diagnosis present

## 2023-06-07 DIAGNOSIS — E039 Hypothyroidism, unspecified: Secondary | ICD-10-CM | POA: Diagnosis present

## 2023-06-07 DIAGNOSIS — I272 Pulmonary hypertension, unspecified: Secondary | ICD-10-CM | POA: Diagnosis present

## 2023-06-07 DIAGNOSIS — Z7984 Long term (current) use of oral hypoglycemic drugs: Secondary | ICD-10-CM | POA: Diagnosis not present

## 2023-06-07 DIAGNOSIS — Z8249 Family history of ischemic heart disease and other diseases of the circulatory system: Secondary | ICD-10-CM | POA: Diagnosis not present

## 2023-06-07 DIAGNOSIS — E11649 Type 2 diabetes mellitus with hypoglycemia without coma: Secondary | ICD-10-CM | POA: Diagnosis not present

## 2023-06-07 DIAGNOSIS — K7460 Unspecified cirrhosis of liver: Secondary | ICD-10-CM | POA: Diagnosis present

## 2023-06-07 DIAGNOSIS — I959 Hypotension, unspecified: Secondary | ICD-10-CM | POA: Insufficient documentation

## 2023-06-07 DIAGNOSIS — Z96651 Presence of right artificial knee joint: Secondary | ICD-10-CM | POA: Diagnosis present

## 2023-06-07 DIAGNOSIS — E785 Hyperlipidemia, unspecified: Secondary | ICD-10-CM | POA: Diagnosis present

## 2023-06-07 DIAGNOSIS — I5032 Chronic diastolic (congestive) heart failure: Secondary | ICD-10-CM | POA: Insufficient documentation

## 2023-06-07 DIAGNOSIS — I480 Paroxysmal atrial fibrillation: Secondary | ICD-10-CM | POA: Diagnosis present

## 2023-06-07 DIAGNOSIS — D6832 Hemorrhagic disorder due to extrinsic circulating anticoagulants: Secondary | ICD-10-CM | POA: Diagnosis present

## 2023-06-07 DIAGNOSIS — Z8673 Personal history of transient ischemic attack (TIA), and cerebral infarction without residual deficits: Secondary | ICD-10-CM | POA: Diagnosis not present

## 2023-06-07 DIAGNOSIS — Z87891 Personal history of nicotine dependence: Secondary | ICD-10-CM | POA: Diagnosis not present

## 2023-06-07 DIAGNOSIS — Z7901 Long term (current) use of anticoagulants: Secondary | ICD-10-CM

## 2023-06-07 DIAGNOSIS — I25810 Atherosclerosis of coronary artery bypass graft(s) without angina pectoris: Secondary | ICD-10-CM | POA: Diagnosis present

## 2023-06-07 DIAGNOSIS — Z79890 Hormone replacement therapy (postmenopausal): Secondary | ICD-10-CM | POA: Diagnosis not present

## 2023-06-07 HISTORY — DX: Gastrointestinal hemorrhage, unspecified: K92.2

## 2023-06-07 LAB — GLUCOSE, CAPILLARY
Glucose-Capillary: 104 mg/dL — ABNORMAL HIGH (ref 70–99)
Glucose-Capillary: 127 mg/dL — ABNORMAL HIGH (ref 70–99)
Glucose-Capillary: 132 mg/dL — ABNORMAL HIGH (ref 70–99)
Glucose-Capillary: 201 mg/dL — ABNORMAL HIGH (ref 70–99)
Glucose-Capillary: 31 mg/dL — CL (ref 70–99)
Glucose-Capillary: 46 mg/dL — ABNORMAL LOW (ref 70–99)
Glucose-Capillary: 51 mg/dL — ABNORMAL LOW (ref 70–99)
Glucose-Capillary: 75 mg/dL (ref 70–99)
Glucose-Capillary: 90 mg/dL (ref 70–99)

## 2023-06-07 LAB — URINALYSIS, ROUTINE W REFLEX MICROSCOPIC
Bacteria, UA: NONE SEEN
Bilirubin Urine: NEGATIVE
Glucose, UA: >=500 mg/dL — AB
Ketones, ur: NEGATIVE mg/dL
Leukocytes,Ua: NEGATIVE
Nitrite: NEGATIVE
Protein, ur: NEGATIVE mg/dL
Specific Gravity, Urine: 1.025 (ref 1.005–1.030)
pH: 5 (ref 5.0–8.0)

## 2023-06-07 LAB — HEMOGLOBIN
Hemoglobin: 10.3 g/dL — ABNORMAL LOW (ref 12.0–15.0)
Hemoglobin: 10.5 g/dL — ABNORMAL LOW (ref 12.0–15.0)

## 2023-06-07 LAB — HIV ANTIBODY (ROUTINE TESTING W REFLEX): HIV Screen 4th Generation wRfx: NONREACTIVE

## 2023-06-07 MED ORDER — VITAMIN B-12 1000 MCG PO TABS
1000.0000 ug | ORAL_TABLET | Freq: Every day | ORAL | Status: DC
  Administered 2023-06-07 – 2023-06-08 (×2): 1000 ug via ORAL
  Filled 2023-06-07 (×2): qty 1

## 2023-06-07 MED ORDER — LATANOPROST 0.005 % OP SOLN
1.0000 [drp] | Freq: Every day | OPHTHALMIC | Status: DC
  Administered 2023-06-07: 1 [drp] via OPHTHALMIC
  Filled 2023-06-07: qty 2.5

## 2023-06-07 MED ORDER — ALPRAZOLAM 0.25 MG PO TABS: 0.2500 mg | ORAL_TABLET | Freq: Every day | ORAL | Status: DC | PRN

## 2023-06-07 MED ORDER — LEVOTHYROXINE SODIUM 50 MCG PO TABS
50.0000 ug | ORAL_TABLET | Freq: Every day | ORAL | Status: DC
  Administered 2023-06-08: 50 ug via ORAL
  Filled 2023-06-07: qty 1

## 2023-06-07 MED ORDER — SIMETHICONE 80 MG PO CHEW
80.0000 mg | CHEWABLE_TABLET | Freq: Four times a day (QID) | ORAL | Status: DC
  Administered 2023-06-07 – 2023-06-08 (×3): 80 mg via ORAL
  Filled 2023-06-07 (×3): qty 1

## 2023-06-07 MED ORDER — ALBUTEROL SULFATE (2.5 MG/3ML) 0.083% IN NEBU: 2.5000 mg | INHALATION_SOLUTION | Freq: Four times a day (QID) | RESPIRATORY_TRACT | Status: DC | PRN

## 2023-06-07 MED ORDER — DEXTROSE-SODIUM CHLORIDE 5-0.9 % IV SOLN: INTRAVENOUS | Status: DC

## 2023-06-07 MED ORDER — ACETAMINOPHEN 325 MG PO TABS
650.0000 mg | ORAL_TABLET | Freq: Four times a day (QID) | ORAL | Status: DC | PRN
  Administered 2023-06-07: 650 mg via ORAL
  Filled 2023-06-07: qty 2

## 2023-06-07 MED ORDER — ACETAMINOPHEN 650 MG RE SUPP: 650.0000 mg | Freq: Four times a day (QID) | RECTAL | Status: DC | PRN

## 2023-06-07 MED ORDER — ONDANSETRON HCL 4 MG PO TABS: 4.0000 mg | ORAL_TABLET | Freq: Four times a day (QID) | ORAL | Status: DC | PRN

## 2023-06-07 MED ORDER — ONDANSETRON HCL 4 MG/2ML IJ SOLN: 4.0000 mg | Freq: Four times a day (QID) | INTRAMUSCULAR | Status: DC | PRN

## 2023-06-07 MED ORDER — INSULIN ASPART 100 UNIT/ML IJ SOLN
0.0000 [IU] | INTRAMUSCULAR | Status: DC
  Administered 2023-06-07 (×2): 2 [IU] via SUBCUTANEOUS
  Administered 2023-06-07: 3 [IU] via SUBCUTANEOUS
  Administered 2023-06-08: 2 [IU] via SUBCUTANEOUS
  Filled 2023-06-07 (×4): qty 1

## 2023-06-07 MED ORDER — FLUTICASONE PROPIONATE 50 MCG/ACT NA SUSP
1.0000 | Freq: Every day | NASAL | Status: DC
  Administered 2023-06-07: 1 via NASAL
  Filled 2023-06-07: qty 16

## 2023-06-07 MED ORDER — CARVEDILOL 6.25 MG PO TABS: 6.2500 mg | ORAL_TABLET | Freq: Two times a day (BID) | ORAL | Status: DC

## 2023-06-07 MED ORDER — PANTOPRAZOLE SODIUM 40 MG IV SOLR
40.0000 mg | Freq: Two times a day (BID) | INTRAVENOUS | Status: DC
  Administered 2023-06-07 – 2023-06-08 (×3): 40 mg via INTRAVENOUS
  Filled 2023-06-07 (×3): qty 10

## 2023-06-07 MED ORDER — MORPHINE SULFATE (PF) 2 MG/ML IV SOLN: 2.0000 mg | INTRAVENOUS | Status: DC | PRN

## 2023-06-07 NOTE — Assessment & Plan Note (Signed)
Sliding scale insulin coverage 

## 2023-06-07 NOTE — Assessment & Plan Note (Signed)
CPAP nightly

## 2023-06-07 NOTE — Assessment & Plan Note (Addendum)
Ischemic cardiomyopathy Clinically euvolemic Hold spironolactone, Entresto, furosemide and carvedilol due to soft blood pressure

## 2023-06-07 NOTE — Progress Notes (Signed)
PROGRESS NOTE    Tammy Arias  WUJ:[id number] DOB: [birth year] DOA: 06/06/2023 PCP: Jerl Mina, MD    Brief Narrative:  67 y.o. 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  She is unsure with what the bleeding came from.  As she stood up the blood was running down her legs.  She was previously in her usual state of health.  She denies chest pain shortness or lightheadedness.  Denies abdominal pain.  Has no nausea or vomiting.    Assessment & Plan:   Principal Problem:   Acute GI bleeding Active Problems:   Chronic anticoagulation   Thrombocytopenia (HCC)   Ischemic cardiomyopathy   Angiodysplasia of intestinal tract   Hypertension   Type 2 diabetes mellitus (HCC)   OSA on CPAP   CAD with hx of CABG   Chronic diastolic CHF (congestive heart failure) (HCC)   Hypotension   Cirrhosis and Portal hypertension  on CT(HCC)   Acute GI bleeding History of gastric and colonic angiectasia Chronic anticoagulation/thrombocytopenia Patient presents with bright red blood per rectum, hemodynamically stable albeit with soft blood pressures CTA negative for acute bleed Plan: Hold Eliquis.  Patient may require endoluminal evaluation.  Will keep n.p.o. for now though I suspect patient will need at least 1 to 2 days for Eliquis to wash out of system prior to endoscopic evaluation.  Thrombocytopenia (HCC) Cirrhosis with portal hypertension on CT Thrombocytopenia 108,000, improved from 80-90 a few months prior Possibly related to cirrhosis seen on CT Continue to monitor    Chronic diastolic CHF (congestive heart failure) (HCC) Ischemic cardiomyopathy Clinically euvolemic Hold spironolactone, Entresto, furosemide  Resume carvedilol 7/10   CAD with hx of CABG S/p nonacute stress test March 2024 No complaints of chest pain Continue carvedilol Holding aspirin along with statin   OSA on CPAP CPAP nightly   Type 2 diabetes mellitus (HCC) Sliding scale insulin coverage   Hypertension Hypotension Holding antihypertensives IV hydration to ensure MAP staying over 65 Monitor for fluid overload  DVT prophylaxis: SCDs Code Status: Full Family Communication: None Disposition Plan: Status is: Inpatient Remains inpatient appropriate because: GI bleed   Level of care: Progressive  Consultants:  Gastroenterology  Procedures:  None  Antimicrobials: None   Subjective: Seen and examined.  Resting comfortably in bed.  No visible distress.  No complaints of pain.  Objective: Vitals:   06/07/23 0101 06/07/23 0351 06/07/23 0700 06/07/23 1216  BP:  (!) 98/45 (!) 100/56 104/62  Pulse:  67 61 63  Resp:  18  16  Temp:  98.3 F (36.8 C) 97.8 F (36.6 C) 97.8 F (36.6 C)  TempSrc:   Oral   SpO2:  96% 98% 99%  Weight: 69.7 kg     Height: 5\' 1"  (1.549 m)       Intake/Output Summary (Last 24 hours) at 06/07/2023 1527 Last data filed at 06/07/2023 1225 Gross per 24 hour  Intake 771.88 ml  Output 900 ml  Net -128.12 ml   Filed Weights   06/06/23 2016 06/07/23 0101  Weight: 65.8 kg 69.7 kg    Examination:  General exam: Appears calm and comfortable  Respiratory system: Clear to auscultation. Respiratory effort normal. Cardiovascular system: S1-S2, RRR, no murmurs, no pedal edema Gastrointestinal system: Soft, NT/ND, normal bowel sounds Central nervous system: Alert and oriented. No focal neurological deficits. Extremities: Symmetric 5 x 5 power. Skin: No rashes, lesions or ulcers Psychiatry: Judgement and insight appear normal. Mood &  affect appropriate.     Data Reviewed: I have personally reviewed following labs and imaging studies  CBC: Recent Labs  Lab 06/06/23 2026 06/07/23 0258 06/07/23 1044  WBC 3.2*  --   --   NEUTROABS 2.1  --   --   HGB 12.3 10.3* 10.5*  HCT 37.5  --   --   MCV 102.2*  --   --   PLT 108*  --   --    Basic Metabolic Panel: Recent Labs  Lab 06/06/23 2026  NA 138  K 3.5  CL 98  CO2 26  GLUCOSE 98  BUN 27*  CREATININE 1.15*  CALCIUM 9.7   GFR: Estimated Creatinine Clearance: 41.8 mL/min (A) (by C-G formula based on SCr of 1.15 mg/dL (H)). Liver Function Tests: No results for input(s): "AST", "ALT", "ALKPHOS", "BILITOT", "PROT", "ALBUMIN" in the last 168 hours. No results for input(s): "LIPASE", "AMYLASE" in the last 168 hours. No results for input(s): "AMMONIA" in the last 168 hours. Coagulation Profile: Recent Labs  Lab 06/06/23 2026  INR 1.3*   Cardiac Enzymes: No results for input(s): "CKTOTAL", "CKMB", "CKMBINDEX", "TROPONINI" in the last 168 hours. BNP (last 3 results) No results for input(s): "PROBNP" in the last 8760 hours. HbA1C: No results for input(s): "HGBA1C" in the last 72 hours. CBG: Recent Labs  Lab 06/07/23 0107 06/07/23 0352 06/07/23 0758 06/07/23 1218  GLUCAP 75 104* 132* 127*   Lipid Profile: No results for input(s): "CHOL", "HDL", "LDLCALC", "TRIG", "CHOLHDL", "LDLDIRECT" in the last 72 hours. Thyroid Function Tests: No results for input(s): "TSH", "T4TOTAL", "FREET4", "T3FREE", "THYROIDAB" in the last 72 hours. Anemia Panel: No results for input(s): "VITAMINB12", "FOLATE", "FERRITIN", "TIBC", "IRON", "RETICCTPCT" in the last 72 hours. Sepsis Labs: No results for input(s): "PROCALCITON", "LATICACIDVEN" in the last 168 hours.  No results found for this or any previous visit (from the past 240 hour(s)).       Radiology Studies: CT ANGIO GI BLEED  Result Date: 06/07/2023 CLINICAL DATA:  Lower GI bleeding.  Hematuria. EXAM: CTA  ABDOMEN AND PELVIS WITHOUT AND WITH CONTRAST TECHNIQUE: Multidetector CT imaging of the abdomen and pelvis was performed using the standard protocol during bolus administration of intravenous contrast. Multiplanar reconstructed images and MIPs were obtained and reviewed to evaluate the vascular anatomy. RADIATION DOSE REDUCTION: This exam was performed according to the departmental dose-optimization program which includes automated exposure control, adjustment of the mA and/or kV according to patient size and/or use of iterative reconstruction technique. CONTRAST:  OMNIPAQUE IOHEXOL 350 MG/ML SOLN COMPARISON:  CT abdomen and pelvis 12/17/2018 FINDINGS: VASCULAR Aorta: Normal caliber aorta without aneurysm, dissection, vasculitis or significant stenosis. There is moderate calcified atherosclerotic disease throughout the aorta. Celiac: Patent without evidence of aneurysm, dissection, vasculitis or significant stenosis. SMA: Patent without evidence of aneurysm, dissection, vasculitis or significant stenosis. Renals: Both renal arteries are patent without evidence of aneurysm, dissection, vasculitis, fibromuscular dysplasia or significant stenosis. IMA: Patent without evidence of aneurysm, dissection, vasculitis or significant stenosis. Inflow: Patent without evidence of aneurysm, dissection, vasculitis or significant stenosis. Proximal Outflow: Bilateral common femoral and visualized portions of the superficial and  profunda femoral arteries are patent without evidence of aneurysm, dissection, vasculitis or significant stenosis. Veins: No obvious venous abnormality within the limitations of this arterial phase study. Review of the MIP images confirms the above findings. NON-VASCULAR Lower chest: No acute abnormality. Hepatobiliary: Gallstones are present. Nodular liver contour is again seen. No biliary ductal dilatation. Pancreas: Unremarkable. No pancreatic ductal dilatation or surrounding inflammatory changes.  Spleen: Mildly enlarged, unchanged. Adrenals/Urinary Tract: There are scattered subcentimeter cortical hypodensities in both kidneys which are too small to characterize, most likely cysts. Adrenal glands and bladder are within normal limits. Stomach/Bowel: Stomach is within normal limits. Appendix appears normal. No evidence of bowel wall thickening, distention, or inflammatory changes. No active gastrointestinal bleeding identified. The appendix is within normal limits. Lymphatic: No enlarged lymph nodes are seen. Reproductive: Uterus and bilateral adnexa are unremarkable. Other: There is a small upper abdominal midline ventral hernia containing mesenteric fat and fluid, new from prior. No abdominopelvic ascites. There surgical clips in the bilateral inguinal regions. Musculoskeletal: Degenerative changes affect the spine. IMPRESSION: VASCULAR 1. No evidence for aortic dissection or aneurysm. 2. No evidence for active gastrointestinal bleeding. Aortic Atherosclerosis (ICD10-I70.0). NON-VASCULAR 1. No acute localizing process in the abdomen or pelvis. 2. Stable findings of cirrhosis and portal hypertension. 3. Cholelithiasis. 4. New small upper abdominal midline ventral hernia containing mesenteric fat and fluid. Electronically Signed   By: Darliss Cheney M.D.   On: 06/07/2023 00:05        Scheduled Meds:  carvedilol  6.25 mg Oral BID WC   cyanocobalamin  1,000 mcg Oral Daily   fluticasone  1 spray Each Nare QHS   insulin aspart  0-15 Units Subcutaneous Q4H   latanoprost  1 drop Both Eyes QHS   [START ON 06/08/2023] levothyroxine  50 mcg Oral QAC breakfast   pantoprazole (PROTONIX) IV  40 mg Intravenous Q12H   Continuous Infusions:  dextrose 5 % and 0.9 % NaCl Stopped (06/07/23 0205)     LOS: 0 days     Tresa Moore, MD [facility]   If 7PM-7AM, please contact night-coverage  06/07/2023, 3:27 PM

## 2023-06-07 NOTE — Assessment & Plan Note (Signed)
History of gastric and colonic angiectasia Chronic anticoagulation/thrombocytopenia Patient presents with bright red blood per rectum, hemodynamically stable albeit with soft blood pressures Hold Eliquis Follow-up CTA Patient is s/p EGD and colonoscopy May 2024 --> patient states 5 polyps were removed GI consult Serial H&H and transfuse if needed IV hydration Close hemodynamic monitoring

## 2023-06-07 NOTE — Assessment & Plan Note (Signed)
Hypotension Holding antihypertensives IV hydration to ensure MAP staying over 65 Monitor for fluid overload

## 2023-06-07 NOTE — Consult Note (Signed)
Wyline Mood , MD [address], [place], [place], [address] [address], [address], [place], [place], [address] Phone: [phone]  Fax: [phone]  Consultation  Referring Provider:     ER Primary Care Physician:  Jerl Mina, MD Primary Gastroenterologist:  [facility]          Reason for Consultation:     lower GI bleed  Date of Admission:  06/06/2023 Date of Consultation:  06/07/2023         HPI:   Tammy Arias is a 67 y.o. female who has been seen in by: [facility] clinic gastroenterology back in February 2024 for iron deficiency anemia with Dr. Timothy Lasso.  Carries a diagnosis of cirrhosis of the liver has been having blood in the stool bright per rectum , on  Eliquis.  colonoscopy in 2021 showed colon polyps EGD 2021 showed no esophageal varices hemoglobin was 10.6 and normal 2023 was receiving IV iron.   03/30/2023: Colonoscopy: Diverticulosis s was seen in the left side of the colon, 5 sessile polyps in the rectum transverse colon ascending colon and cecum were resected 1 to 2 mm in size and a 3 to 4 mm polyp in the rectum resected hemorrhoids also noted.  An upper endoscopy was performed on the same day that showed small hiatal hernia no esophageal varices small hiatal hernia.  Biopsies of the duodenum showed no abnormalities reflux esophagitis and biopsies of the esophagus polyps were tubular adenomas.   Admitted overnight bright red blood in toilet bowl yesterday , continued till she came into the hospital , painless , on Eliquis underwent a CT angiogram that showed no acute abnormalities. No nsaid use, no hematemesis , no abdominal pain  On admission hemoglobin was 12.3 g this morning is 10.5 g creatinine 1.15.    Past Medical History:  Diagnosis Date   AC (acromioclavicular) joint bone spurs, right    Anemia    Arthritis    Atrial fibrillation (HCC)    a.) CHA2DS2-VASc = 7 (age, sex, HFrEF, HTN, TIA x 2, T2DM). b.) rate/rhythm controlled on oral carvedilol;  chronically anticoagulated with warfarin.   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 b.) R/LHC 03/29/2017: small RCA with occluded SVG; no significant Dz. Chronically occluded LAD with patent SVG to D1/LAD. Insignificant Dz in LCx. LM normal.   Cortical cataract    DOE (dyspnea on exertion)    DOE (dyspnea on exertion)    GERD (gastroesophageal reflux disease)    HFrEF (heart failure with reduced ejection fraction) (HCC)    a.) TTE 10/27/2014: EF 40%; glob HK; mild BAE; triv AR, mild MR/PR, mod TR.  b.) TTE 03/15/2017: EF 30%; glob HK; mod BAE; mod pHTN (RVSP 54.8 mmHg); mild PR, mod MR; sev TR.  c.) R/LHC 03/29/2017: EF 30-35%. d.)TTE 10/25/2018: EF 35%; mod BAE; mod BVE; glob HK; triv PR, mod MR, sev TR; RVSP 57.7 mmHg; G2DD.   History of shingles 2004   Hyperlipidemia    Hypertension    Hypothyroidism    IBS (irritable bowel syndrome)    Ischemic cardiomyopathy    a.) TTE 10/27/2014: EF 40%. b.) TTE 03/15/2017: EF 30%. c.) R/LHC 04/01/2017: EF 30-35%. d.) TTE 10/25/2018: EF 35%   Lumbar scoliosis    Lumbar spinal stenosis  Migraines    Osteoporosis    Pulmonary HTN (HCC)    a.) TTE 03/15/2017: EF 30-35%; RVSP 54.8 mmHg. 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   Sleep apnea    T2DM (type 2 diabetes mellitus) (HCC)    TIA (transient ischemic attack) 05/29/2016   Vitamin B 12 deficiency     Past Surgical History:  Procedure Laterality Date   CARDIAC CATHETERIZATION     CATARACT EXTRACTION W/ INTRAOCULAR LENS IMPLANT Bilateral    Cataract Extraction with IOL   COLONOSCOPY N/A 12/19/2018   Procedure: COLONOSCOPY;  Surgeon: Pasty Spillers, MD;  Location: [facility] ENDOSCOPY;  Service: Endoscopy;  Laterality: N/A;    COLONOSCOPY WITH PROPOFOL N/A 06/17/2020   Procedure: COLONOSCOPY WITH PROPOFOL;  Surgeon: Pasty Spillers, MD;  Location: [facility] ENDOSCOPY;  Service: Endoscopy;  Laterality: N/A;   COLONOSCOPY WITH PROPOFOL N/A 03/30/2023   Procedure: COLONOSCOPY WITH PROPOFOL;  Surgeon: Jaynie Collins, DO;  Location: [facility] ENDOSCOPY;  Service: Endoscopy;  Laterality: N/A;   COR TRIATRIATUM REPAIR N/A 01/28/2004   CORONARY ARTERY BYPASS GRAFT N/A 01/28/2004   3v CABG   ESOPHAGOGASTRODUODENOSCOPY N/A 12/19/2018   Procedure: ESOPHAGOGASTRODUODENOSCOPY (EGD);  Surgeon: Pasty Spillers, MD;  Location[facility] ENDOSCOPY;  Service: Endoscopy;  Laterality: N/A;   ESOPHAGOGASTRODUODENOSCOPY (EGD) WITH PROPOFOL N/A 06/17/2020   Procedure: ESOPHAGOGASTRODUODENOSCOPY (EGD) WITH PROPOFOL;  Surgeon: Pasty Spillers, MD;  Location: [facility] ENDOSCOPY;  Service: Endoscopy;  Laterality: N/A;   ESOPHAGOGASTRODUODENOSCOPY (EGD) WITH PROPOFOL N/A 03/30/2023   Procedure: ESOPHAGOGASTRODUODENOSCOPY (EGD) WITH PROPOFOL;  Surgeon: Jaynie Collins, DO;  Location: [facility] ENDOSCOPY;  Service: Endoscopy;  Laterality: N/A;   RIGHT/LEFT HEART CATH AND CORONARY ANGIOGRAPHY Bilateral 03/29/2017   Procedure: Right/Left Heart Cath and Coronary Angiography;  Surgeon: Dalia Heading, MD;  Location: [facility] INVASIVE CV LAB;  Service: Cardiovascular;  Laterality: Bilateral;   TOTAL KNEE ARTHROPLASTY Right 04/05/2022   Procedure: TOTAL KNEE ARTHROPLASTY;  Surgeon: Kennedy Bucker, MD;  Location: [facility] ORS;  Service: Orthopedics;  Laterality: Right;   TUBAL LIGATION     VENTRAL HERNIA REPAIR N/A 04/21/2017   Procedure: HERNIA REPAIR VENTRAL ADULT;  Surgeon: Nadeen Landau, MD;  Location: [facility] ORS;  Service: General;  Laterality: N/A;    Prior to Admission medications   Medication Sig Start Date End Date Taking? Authorizing Provider  albuterol (PROVENTIL HFA;VENTOLIN HFA) 108 (90 Base) MCG/ACT inhaler Inhale 2 puffs into the lungs every  6 (six) hours as needed for wheezing or shortness of breath.   Yes [provider]  ALPRAZolam (XANAX) 0.25 MG tablet Take 0.25 mg by mouth daily as needed for anxiety. 06/05/23 07/05/23 Yes [provider]  aspirin EC 81 MG tablet Take 81 mg by mouth daily.   Yes [provider]  carvedilol (COREG) 6.25 MG tablet Take 6.25 mg by mouth 2 (two) times daily with a meal. 01/06/23  Yes [provider]  fluticasone (FLONASE) 50 MCG/ACT nasal spray Place 1 spray into both nostrils at bedtime. 02/05/19  Yes [provider]  furosemide (LASIX) 40 MG tablet Take 40 mg by mouth daily. Pt stated she take 40 mg once per day   Yes [provider]  JARDIANCE 10 MG TABS tablet Take 10 mg by mouth daily.   Yes [provider]  latanoprost (XALATAN) 0.005 % ophthalmic solution Place 1 drop into both eyes  at bedtime.   Yes [provider]  levothyroxine (SYNTHROID) 50 MCG tablet Take 50 mcg by mouth daily before breakfast.   Yes [provider]  lovastatin (MEVACOR) 10 MG tablet Take 10 mg by mouth at bedtime.   Yes [provider]  metFORMIN (GLUCOPHAGE) 1000 MG tablet Take 1,000 mg by mouth 2 (two) times daily with a meal.   Yes [provider]  ONETOUCH ULTRA test strip  10/15/22  Yes [provider]  pantoprazole (PROTONIX) 40 MG tablet Take 40 mg by mouth daily. 01/28/21  Yes [provider]  sacubitril-valsartan (ENTRESTO) 49-51 MG Take 1 tablet by mouth 2 (two) times daily.   Yes [provider]  spironolactone (ALDACTONE) 25 MG tablet Take 25 mg by mouth daily.   Yes [provider]  vitamin B-12 (CYANOCOBALAMIN) 1000 MCG tablet Take 1,000 mcg by mouth daily.   Yes [provider]  cyclobenzaprine (FLEXERIL) 5 MG tablet Take by mouth. Patient not taking: Reported on 01/20/2023 12/28/22   [provider]  diclofenac Sodium (VOLTAREN) 1 % GEL Apply 1 application.  topically 4 (four) times daily as needed (pain). Patient not taking: Reported on 06/07/2023    [provider]  docusate sodium (COLACE) 100 MG capsule Take 1 capsule (100 mg total) by mouth 2 (two) times daily. Patient not taking: Reported on 11/02/2022 04/07/22   Evon Slack, PA-C  ELIQUIS 5 MG TABS tablet Take 5 mg by mouth 2 (two) times daily.    [provider]  ENTRESTO 24-26 MG Take 1 tablet by mouth 2 (two) times daily. Patient not taking: Reported on 01/20/2023 01/27/21   [provider]  HYDROcodone-acetaminophen (NORCO) 7.5-325 MG tablet Take 1-2 tablets by mouth every 4 (four) hours as needed for severe pain (pain score 7-10). Patient not taking: Reported on 11/02/2022 04/07/22   Evon Slack, PA-C  methocarbamol (ROBAXIN) 500 MG tablet Take 1 tablet (500 mg total) by mouth every 6 (six) hours as needed for muscle spasms. Patient not taking: Reported on 11/02/2022 04/07/22   Evon Slack, PA-C  ondansetron (ZOFRAN) 4 MG tablet Take 1 tablet (4 mg total) by mouth every 6 (six) hours as needed for nausea. Patient not taking: Reported on 04/22/2022 04/07/22   Evon Slack, PA-C  polyethylene glycol (MIRALAX / GLYCOLAX) 17 g packet Take 17 g by mouth daily as needed for mild constipation. Patient not taking: Reported on 11/02/2022 04/07/22   Evon Slack, PA-C  polyethylene glycol-electrolytes (NULYTELY) 420 g solution Take by mouth. Patient not taking: Reported on 03/30/2023    [provider]  predniSONE (DELTASONE) 10 MG tablet Take by mouth. Patient not taking: Reported on 01/20/2023 12/28/22   [provider]  traMADol (ULTRAM) 50 MG tablet Take 1 tablet (50 mg total) by mouth every 6 (six) hours as needed. Patient not taking: Reported on 01/20/2023 04/07/22   Evon Slack, PA-C  warfarin (COUMADIN) 3 MG tablet Take 3-4.5 mg by mouth See admin instructions. 4.5 mg on Monday, 3 mg all other days Patient not taking: Reported on  01/20/2023    [provider]    Family History  Problem Relation Age of Onset   Valvular heart disease Mother    Diabetes Father    Cancer Sister        lung   Cancer Sister        cervical   Breast cancer Cousin      Social History  Tobacco Use   Smoking status: Former    Types: Cigarettes    Quit date: 03/30/1999    Years since quitting: 24.2   Smokeless tobacco: Never  Vaping Use   Vaping Use: Never used  Substance Use Topics   Alcohol use: No   Drug use: No    Allergies as of 06/06/2023 - Review Complete 06/06/2023  Allergen Reaction Noted   Vioxx [rofecoxib] Swelling 03/24/2017    Review of Systems:    All systems reviewed and negative except where noted in HPI.   Physical Exam:  Vital signs in last 24 hours: Temp:  [97.6 F (36.4 C)-98.3 F (36.8 C)] 97.8 F (36.6 C) (07/10 1216) Pulse Rate:  [61-82] 63 (07/10 1216) Resp:  [14-20] 16 (07/10 1216) BP: (98-121)/(45-64) 104/62 (07/10 1216) SpO2:  [94 %-100 %] 99 % (07/10 1216) Weight:  [65.8 kg-69.7 kg] 69.7 kg (07/10 0101) Last BM Date : 06/06/23 General:   Pleasant, cooperative in NAD Head:  Normocephalic and atraumatic. Eyes:   No icterus.   Conjunctiva pink. PERRLA. Ears:  Normal auditory acuity. Neck:  Supple; no masses or thyroidomegaly Lungs: Respirations even and unlabored. Lungs clear to auscultation bilaterally.   No wheezes, crackles, or rhonchi.  Heart:  Regular rate and rhythm;  Without murmur, clicks, rubs or gallops Abdomen:  Soft, nondistended, nontender. Normal bowel sounds. No appreciable masses or hepatomegaly.  No rebound or guarding.  Neurologic:  Alert and oriented x3;  grossly normal neurologically. Skin:  Intact without significant lesions or rashes. Cervical Nodes:  No significant cervical adenopathy. Psych:  Alert and cooperative. Normal affect.  LAB RESULTS: Recent Labs    06/06/23 2026 06/07/23 0258 06/07/23 1044  WBC 3.2*  --   --   HGB 12.3 10.3* 10.5*  HCT  37.5  --   --   PLT 108*  --   --    BMET Recent Labs    06/06/23 2026  NA 138  K 3.5  CL 98  CO2 26  GLUCOSE 98  BUN 27*  CREATININE 1.15*  CALCIUM 9.7   LFT No results for input(s): "PROT", "ALBUMIN", "AST", "ALT", "ALKPHOS", "BILITOT", "BILIDIR", "IBILI" in the last 72 hours. PT/INR Recent Labs    06/06/23 2026  LABPROT 16.2*  INR 1.3*    STUDIES: CT ANGIO GI BLEED  Result Date: 06/07/2023 CLINICAL DATA:  Lower GI bleeding.  Hematuria. EXAM: CTA ABDOMEN AND PELVIS WITHOUT AND WITH CONTRAST TECHNIQUE: Multidetector CT imaging of the abdomen and pelvis was performed using the standard protocol during bolus administration of intravenous contrast. Multiplanar reconstructed images and MIPs were obtained and reviewed to evaluate the vascular anatomy. RADIATION DOSE REDUCTION: This exam was performed according to the departmental dose-optimization program which includes automated exposure control, adjustment of the mA and/or kV according to patient size and/or use of iterative reconstruction technique. CONTRAST:  OMNIPAQUE IOHEXOL 350 MG/ML SOLN COMPARISON:  CT abdomen and pelvis 12/17/2018 FINDINGS: VASCULAR Aorta: Normal caliber aorta without aneurysm, dissection, vasculitis or significant stenosis. There is moderate calcified atherosclerotic disease throughout the aorta. Celiac: Patent without evidence of aneurysm, dissection, vasculitis or significant stenosis. SMA: Patent without evidence of aneurysm, dissection, vasculitis or significant stenosis. Renals: Both renal arteries are patent without evidence of aneurysm, dissection, vasculitis, fibromuscular dysplasia or significant stenosis. IMA: Patent without evidence of aneurysm, dissection, vasculitis or significant stenosis. Inflow: Patent without evidence of aneurysm, dissection, vasculitis or significant stenosis. Proximal Outflow: Bilateral common femoral and visualized portions of the superficial and profunda  femoral arteries  are patent without evidence of aneurysm, dissection, vasculitis or significant stenosis. Veins: No obvious venous abnormality within the limitations of this arterial phase study. Review of the MIP images confirms the above findings. NON-VASCULAR Lower chest: No acute abnormality. Hepatobiliary: Gallstones are present. Nodular liver contour is again seen. No biliary ductal dilatation. Pancreas: Unremarkable. No pancreatic ductal dilatation or surrounding inflammatory changes. Spleen: Mildly enlarged, unchanged. Adrenals/Urinary Tract: There are scattered subcentimeter cortical hypodensities in both kidneys which are too small to characterize, most likely cysts. Adrenal glands and bladder are within normal limits. Stomach/Bowel: Stomach is within normal limits. Appendix appears normal. No evidence of bowel wall thickening, distention, or inflammatory changes. No active gastrointestinal bleeding identified. The appendix is within normal limits. Lymphatic: No enlarged lymph nodes are seen. Reproductive: Uterus and bilateral adnexa are unremarkable. Other: There is a small upper abdominal midline ventral hernia containing mesenteric fat and fluid, new from prior. No abdominopelvic ascites. There surgical clips in the bilateral inguinal regions. Musculoskeletal: Degenerative changes affect the spine. IMPRESSION: VASCULAR 1. No evidence for aortic dissection or aneurysm. 2. No evidence for active gastrointestinal bleeding. Aortic Atherosclerosis (ICD10-I70.0). NON-VASCULAR 1. No acute localizing process in the abdomen or pelvis. 2. Stable findings of cirrhosis and portal hypertension. 3. Cholelithiasis. 4. New small upper abdominal midline ventral hernia containing mesenteric fat and fluid. Electronically Signed   By: Darliss Cheney M.D.   On: 06/07/2023 00:05      Impression / Plan:   LELAINA OATIS is a 67 y.o. y/o female with history of cirrhosis of the liver CHF CAD on Eliquis presents with bright red blood per  rectum recent EGD and colonoscopy in May 2024 by Dr. Timothy Lasso showed diverticulosis of colon internal hemorrhoids and few diminutive polyps.  Very likely the bleeding is from a diverticular bleed.  CT angiogram showed no acute bleeding  Plan 1.  Full liquid diet, monitor CBC color of the stool Over next 24 to 48 hours . I would recommend endoscopic evaluation only if she continues to bleed  Thank you for involving me in the care of this patient.      LOS: 0 days   Wyline Mood, MD  06/07/2023, 2:16 PM

## 2023-06-07 NOTE — Assessment & Plan Note (Signed)
S/p nonacute stress test March 2024 No complaints of chest pain Holding carvedilol and aspirin and lovastatin while n.p.o.

## 2023-06-07 NOTE — Assessment & Plan Note (Addendum)
Cirrhosis with portal hypertension on CT Thrombocytopenia 108,000, improved from 80-90 a few months prior Possibly related to cirrhosis seen on CT Continue to monitor

## 2023-06-07 NOTE — Progress Notes (Signed)
Transition of Care [facility]) - Inpatient Brief Assessment   Patient Details  Name: VELITA QUIRK MRN: [id number] Date of Birth: 28-May-1956  Transition of Care [facility]) CM/SW Contact:    Truddie Hidden, RN Phone Number: 06/07/2023, 9:57 AM   Clinical Narrative: TOC assessing for ongoing needs and discharge planning.   Transition of Care Asessment: Insurance and Status: Insurance coverage has been reviewed Patient has primary care physician: Yes Home environment has been reviewed: Return to previous environment Prior level of function:: Independent Prior/Current Home Services: No current home services Social Determinants of Health Reivew: SDOH reviewed no interventions necessary Readmission risk has been reviewed: Yes Transition of care needs: no transition of care needs at this time

## 2023-06-07 NOTE — Plan of Care (Signed)

## 2023-06-08 DIAGNOSIS — K922 Gastrointestinal hemorrhage, unspecified: Secondary | ICD-10-CM | POA: Diagnosis not present

## 2023-06-08 LAB — GLUCOSE, CAPILLARY
Glucose-Capillary: 127 mg/dL — ABNORMAL HIGH (ref 70–99)
Glucose-Capillary: 133 mg/dL — ABNORMAL HIGH (ref 70–99)
Glucose-Capillary: 95 mg/dL (ref 70–99)
Glucose-Capillary: 98 mg/dL (ref 70–99)

## 2023-06-08 LAB — CBC WITH DIFFERENTIAL/PLATELET
Abs Immature Granulocytes: 0 10*3/uL (ref 0.00–0.07)
Basophils Absolute: 0 10*3/uL (ref 0.0–0.1)
Basophils Relative: 1 %
Eosinophils Absolute: 0.1 10*3/uL (ref 0.0–0.5)
Eosinophils Relative: 7 %
HCT: 29.6 % — ABNORMAL LOW (ref 36.0–46.0)
Hemoglobin: 9.9 g/dL — ABNORMAL LOW (ref 12.0–15.0)
Immature Granulocytes: 0 %
Lymphocytes Relative: 17 %
Lymphs Abs: 0.3 10*3/uL — ABNORMAL LOW (ref 0.7–4.0)
MCH: 33.8 pg (ref 26.0–34.0)
MCHC: 33.4 g/dL (ref 30.0–36.0)
MCV: 101 fL — ABNORMAL HIGH (ref 80.0–100.0)
Monocytes Absolute: 0.2 10*3/uL (ref 0.1–1.0)
Monocytes Relative: 11 %
Neutro Abs: 1.1 10*3/uL — ABNORMAL LOW (ref 1.7–7.7)
Neutrophils Relative %: 64 %
Platelets: 79 10*3/uL — ABNORMAL LOW (ref 150–400)
RBC: 2.93 MIL/uL — ABNORMAL LOW (ref 3.87–5.11)
RDW: 12.6 % (ref 11.5–15.5)
WBC: 1.7 10*3/uL — ABNORMAL LOW (ref 4.0–10.5)
nRBC: 0 % (ref 0.0–0.2)

## 2023-06-08 LAB — HEMOGLOBIN AND HEMATOCRIT, BLOOD
HCT: 31 % — ABNORMAL LOW (ref 36.0–46.0)
Hemoglobin: 10.4 g/dL — ABNORMAL LOW (ref 12.0–15.0)

## 2023-06-08 LAB — HEMOGLOBIN A1C
Hgb A1c MFr Bld: 6.3 % — ABNORMAL HIGH (ref 4.8–5.6)
Mean Plasma Glucose: 134 mg/dL

## 2023-06-08 MED ORDER — INSULIN ASPART 100 UNIT/ML IJ SOLN
0.0000 [IU] | INTRAMUSCULAR | Status: DC
  Administered 2023-06-08: 1 [IU] via SUBCUTANEOUS
  Filled 2023-06-08: qty 1

## 2023-06-08 NOTE — Progress Notes (Signed)
Wyline Mood , MD [address], [address], [place], [place], [address] [address], [place], [place], [address] Phone: [phone]  Fax: [phone]   Tammy Arias is being followed for lower GI lead day 1 of follow up   Subjective: No bleeding . Doing well , no abdominal pain.    Objective: Vital signs in last 24 hours: Vitals:   06/07/23 1937 06/07/23 2323 06/08/23 0357 06/08/23 0755  BP: 95/67 129/77 102/70 (!) 104/93  Pulse: 64 74 78 72  Resp: 20 18 18 16   Temp: 97.7 F (36.5 C) (!) 97.5 F (36.4 C) 97.7 F (36.5 C) 97.8 F (36.6 C)  TempSrc:      SpO2: 97% 97% 100% 100%  Weight:      Height:       Weight change:   Intake/Output Summary (Last 24 hours) at 06/08/2023 1129 Last data filed at 06/08/2023 1028 Gross per 24 hour  Intake 807.85 ml  Output 1475 ml  Net -667.15 ml     Exam: Neuro : AxO x3 no gross neurological deficit   Lab Results: @LABTEST2 @ Micro Results: No results found for this or any previous visit (from the past 240 hour(s)). Studies/Results: DG Abd 1 View  Result Date: 06/07/2023 CLINICAL DATA:  Abdominal pain. EXAM: ABDOMEN - 1 VIEW COMPARISON:  Abdominal CTA yesterday FINDINGS: Increased small bowel gas compared yesterday's CT but no obstruction or abnormal distention. Small volume of formed stool in the colon. Excreted IV contrast in the urinary bladder. No of free air. IMPRESSION: Nonobstructive bowel gas pattern. Electronically Signed   By: Narda Rutherford M.D.   On: 06/07/2023 16:06   CT ANGIO GI BLEED  Result Date: 06/07/2023 CLINICAL DATA:  Lower GI bleeding.  Hematuria. EXAM: CTA ABDOMEN AND PELVIS WITHOUT AND WITH CONTRAST TECHNIQUE: Multidetector CT imaging of the abdomen and pelvis was performed using the standard protocol during bolus administration of intravenous contrast. Multiplanar reconstructed images and MIPs were obtained and reviewed to evaluate the vascular anatomy. RADIATION DOSE REDUCTION: This exam  was performed according to the departmental dose-optimization program which includes automated exposure control, adjustment of the mA and/or kV according to patient size and/or use of iterative reconstruction technique. CONTRAST:  OMNIPAQUE IOHEXOL 350 MG/ML SOLN COMPARISON:  CT abdomen and pelvis 12/17/2018 FINDINGS: VASCULAR Aorta: Normal caliber aorta without aneurysm, dissection, vasculitis or significant stenosis. There is moderate calcified atherosclerotic disease throughout the aorta. Celiac: Patent without evidence of aneurysm, dissection, vasculitis or significant stenosis. SMA: Patent without evidence of aneurysm, dissection, vasculitis or significant stenosis. Renals: Both renal arteries are patent without evidence of aneurysm, dissection, vasculitis, fibromuscular dysplasia or significant stenosis. IMA: Patent without evidence of aneurysm, dissection, vasculitis or significant stenosis. Inflow: Patent without evidence of aneurysm, dissection, vasculitis or significant stenosis. Proximal Outflow: Bilateral common femoral and visualized portions of the superficial and profunda femoral arteries are patent without evidence of aneurysm, dissection, vasculitis or significant stenosis. Veins: No obvious venous abnormality within the limitations of this arterial phase study. Review of the MIP images confirms the above findings. NON-VASCULAR Lower chest: No acute abnormality. Hepatobiliary: Gallstones are present. Nodular liver contour is again seen. No biliary ductal dilatation. Pancreas: Unremarkable. No pancreatic ductal dilatation or surrounding inflammatory changes. Spleen: Mildly enlarged, unchanged. Adrenals/Urinary Tract: There are scattered subcentimeter cortical hypodensities in both kidneys which are too small to characterize, most likely cysts. Adrenal glands and bladder are within normal limits. Stomach/Bowel: Stomach is within normal limits. Appendix appears normal. No  evidence of bowel wall  thickening, distention, or inflammatory changes. No active gastrointestinal bleeding identified. The appendix is within normal limits. Lymphatic: No enlarged lymph nodes are seen. Reproductive: Uterus and bilateral adnexa are unremarkable. Other: There is a small upper abdominal midline ventral hernia containing mesenteric fat and fluid, new from prior. No abdominopelvic ascites. There surgical clips in the bilateral inguinal regions. Musculoskeletal: Degenerative changes affect the spine. IMPRESSION: VASCULAR 1. No evidence for aortic dissection or aneurysm. 2. No evidence for active gastrointestinal bleeding. Aortic Atherosclerosis (ICD10-I70.0). NON-VASCULAR 1. No acute localizing process in the abdomen or pelvis. 2. Stable findings of cirrhosis and portal hypertension. 3. Cholelithiasis. 4. New small upper abdominal midline ventral hernia containing mesenteric fat and fluid. Electronically Signed   By: Darliss Cheney M.D.   On: 06/07/2023 00:05   Medications: I have reviewed the patient's current medications. Scheduled Meds:  carvedilol  6.25 mg Oral BID WC   cyanocobalamin  1,000 mcg Oral Daily   fluticasone  1 spray Each Nare QHS   insulin aspart  0-9 Units Subcutaneous Q4H   latanoprost  1 drop Both Eyes QHS   levothyroxine  50 mcg Oral QAC breakfast   pantoprazole (PROTONIX) IV  40 mg Intravenous Q12H   simethicone  80 mg Oral QID   Continuous Infusions: PRN Meds:.acetaminophen **OR** acetaminophen, albuterol, ALPRAZolam, morphine injection, ondansetron **OR** ondansetron (ZOFRAN) IV   Assessment: Principal Problem:   Lower GI bleed Active Problems:   Ischemic cardiomyopathy   Angiodysplasia of intestinal tract   Hypertension   Type 2 diabetes mellitus (HCC)   OSA on CPAP   Thrombocytopenia (HCC)   CAD with hx of CABG   Chronic diastolic CHF (congestive heart failure) (HCC)   Chronic anticoagulation   Hypotension   Cirrhosis and Portal hypertension  on CT(HCC)  Tammy Arias  is a 67 y.o. y/o female with history of cirrhosis of the liver CHF CAD on Eliquis presents with bright red blood per rectum recent EGD and colonoscopy in May 2024 by Dr. Timothy Lasso showed diverticulosis of colon internal hemorrhoids and few diminutive polyps.  Very likely the bleeding is from a diverticular bleed.  CT angiogram showed no acute bleeding   Plan 1.  Hemoglobin stable can go home    LOS: 1 day   Wyline Mood, MD 06/08/2023, 11:29 AM

## 2023-06-08 NOTE — Discharge Summary (Signed)
Physician Discharge Summary  Tammy Arias:[id number] DOB: 01-08-1956 DOA: 06/06/2023  PCP: Jerl Mina, MD  Admit date: 06/06/2023 Discharge date: 06/08/2023  Admitted From: Home Disposition:  Home  Recommendations for Outpatient Follow-up:  Follow up with PCP in 1-2 weeks   Home Health:No Equipment/Devices:None   Discharge Condition:Stable CODE STATUS:FULL Diet recommendation: Reg  Brief/Interim Summary:  67 y.o. 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  She is unsure with what the bleeding came from.  As she stood up the blood was running down her legs.  She was previously in her usual state of health.  She denies chest pain shortness or lightheadedness.  Denies abdominal pain.  Has no nausea or vomiting.     Discharge Diagnoses:  Principal Problem:   Lower GI bleed Active Problems:   Chronic anticoagulation   Thrombocytopenia (HCC)   Ischemic cardiomyopathy   Angiodysplasia of intestinal tract   Hypertension   Type 2 diabetes mellitus (HCC)   OSA on CPAP   CAD with hx of CABG   Chronic diastolic CHF (congestive heart failure) (HCC)   Hypotension   Cirrhosis and Portal hypertension  on CT(HCC)  Acute GI bleeding History of gastric and colonic angiectasia Chronic anticoagulation/thrombocytopenia Patient presents with bright red blood per rectum, hemodynamically stable albeit with soft blood pressures CTA negative for acute bleed Endoscopy with diverticulosis Hemoglobin stable on postprocedure day 1 Plan: Okay for discharge.  Recommend  holding Eliquis for period of 1 week.  Can restart Eliquis Tuesday 7/16.  Will need outpatient follow-up with PCP.    Discharge Instructions  Discharge Instructions     Diet - low sodium heart healthy   Complete by: As directed    Increase activity slowly   Complete by: As directed       Allergies as of 06/08/2023       Reactions   Vioxx [rofecoxib] Swelling        Medication List     STOP taking these medications    cyclobenzaprine 5 MG tablet Commonly known as: FLEXERIL   diclofenac Sodium 1 % Gel Commonly known as: VOLTAREN   docusate sodium 100 MG capsule Commonly known as: COLACE   HYDROcodone-acetaminophen 7.5-325 MG tablet Commonly known as: NORCO   methocarbamol 500 MG tablet Commonly known as: ROBAXIN   ondansetron 4 MG tablet Commonly known as: ZOFRAN   polyethylene glycol 17 g packet Commonly known as: MIRALAX / GLYCOLAX   polyethylene glycol-electrolytes 420 g solution Commonly known as: NuLYTELY   predniSONE 10 MG tablet Commonly known as: DELTASONE   traMADol 50 MG tablet Commonly known as: ULTRAM   warfarin 3 MG tablet Commonly known as: COUMADIN       TAKE these medications    albuterol 108 (90 Base) MCG/ACT inhaler Commonly known as: VENTOLIN HFA Inhale 2 puffs into the lungs every 6 (six) hours as needed for wheezing or shortness of breath.   ALPRAZolam 0.25 MG tablet Commonly known as: XANAX Take 0.25 mg by mouth daily as needed for anxiety.   aspirin EC 81 MG tablet Take 81 mg by mouth  daily.   carvedilol 6.25 MG tablet Commonly known as: COREG Take 6.25 mg by mouth 2 (two) times daily with a meal.   cyanocobalamin 1000 MCG tablet Commonly known as: VITAMIN B12 Take 1,000 mcg by mouth daily.   Eliquis 5 MG Tabs tablet Generic drug: apixaban Take 5 mg by mouth 2 (two) times daily.   Entresto 49-51 MG Generic drug: sacubitril-valsartan Take 1 tablet by mouth 2 (two) times daily. What changed: Another  medication with the same name was removed. Continue taking this medication, and follow the directions you see here.   fluticasone 50 MCG/ACT nasal spray Commonly known as: FLONASE Place 1 spray into both nostrils at bedtime.   furosemide 40 MG tablet Commonly known as: LASIX Take 40 mg by mouth daily. Pt stated she take 40 mg once per day   Jardiance 10 MG Tabs tablet Generic drug: empagliflozin Take 10 mg by mouth daily.   latanoprost 0.005 % ophthalmic solution Commonly known as: XALATAN Place 1 drop into both eyes at bedtime.   levothyroxine 50 MCG tablet Commonly known as: SYNTHROID Take 50 mcg by mouth daily before breakfast.   lovastatin 10 MG tablet Commonly known as: MEVACOR Take 10 mg by mouth at bedtime.   metFORMIN 1000 MG tablet Commonly known as: GLUCOPHAGE Take 1,000 mg by mouth 2 (two) times daily with a meal.   OneTouch Ultra test strip Generic drug: glucose blood   pantoprazole 40 MG tablet Commonly known as: PROTONIX Take 40 mg by mouth daily.   spironolactone 25 MG tablet Commonly known as: ALDACTONE Take 25 mg by mouth daily.        Allergies  Allergen Reactions   Vioxx [Rofecoxib] Swelling    Consultations: GI   Procedures/Studies: DG Abd 1 View  Result Date: 06/07/2023 CLINICAL DATA:  Abdominal pain. EXAM: ABDOMEN - 1 VIEW COMPARISON:  Abdominal CTA yesterday FINDINGS: Increased small bowel gas compared yesterday's CT but no obstruction or abnormal distention. Small volume of formed stool in the colon. Excreted IV contrast in the urinary bladder. No of free air. IMPRESSION: Nonobstructive bowel gas pattern. Electronically Signed   By: Narda Rutherford M.D.   On: 06/07/2023 16:06   CT ANGIO GI BLEED  Result Date: 06/07/2023 CLINICAL DATA:  Lower GI bleeding.  Hematuria. EXAM: CTA ABDOMEN AND PELVIS WITHOUT AND WITH CONTRAST TECHNIQUE: Multidetector CT imaging of the abdomen and pelvis was performed using the standard protocol during  bolus administration of intravenous contrast. Multiplanar reconstructed images and MIPs were obtained and reviewed to evaluate the vascular anatomy. RADIATION DOSE REDUCTION: This exam was performed according to the departmental dose-optimization program which includes automated exposure control, adjustment of the mA and/or kV according to patient size and/or use of iterative reconstruction technique. CONTRAST:  OMNIPAQUE IOHEXOL 350 MG/ML SOLN COMPARISON:  CT abdomen and pelvis 12/17/2018 FINDINGS: VASCULAR Aorta: Normal caliber aorta without aneurysm, dissection, vasculitis or significant stenosis. There is moderate calcified atherosclerotic disease throughout the aorta. Celiac: Patent without evidence of aneurysm, dissection, vasculitis or significant stenosis. SMA: Patent without evidence of aneurysm, dissection, vasculitis or significant stenosis. Renals: Both renal arteries are patent without evidence of aneurysm, dissection, vasculitis, fibromuscular dysplasia or significant stenosis. IMA: Patent without evidence of aneurysm, dissection, vasculitis or significant stenosis. Inflow: Patent without evidence of aneurysm, dissection, vasculitis or significant stenosis. Proximal Outflow: Bilateral common femoral and visualized portions of the superficial and profunda femoral arteries are patent without evidence of aneurysm, dissection, vasculitis or significant stenosis. Veins: No obvious venous  abnormality within the limitations of this arterial phase study. Review of the MIP images confirms the above findings. NON-VASCULAR Lower chest: No acute abnormality. Hepatobiliary: Gallstones are present. Nodular liver contour is again seen. No biliary ductal dilatation. Pancreas: Unremarkable. No pancreatic ductal dilatation or surrounding inflammatory changes. Spleen: Mildly enlarged, unchanged. Adrenals/Urinary Tract: There are scattered subcentimeter cortical hypodensities in both kidneys which are too small to  characterize, most likely cysts. Adrenal glands and bladder are within normal limits. Stomach/Bowel: Stomach is within normal limits. Appendix appears normal. No evidence of bowel wall thickening, distention, or inflammatory changes. No active gastrointestinal bleeding identified. The appendix is within normal limits. Lymphatic: No enlarged lymph nodes are seen. Reproductive: Uterus and bilateral adnexa are unremarkable. Other: There is a small upper abdominal midline ventral hernia containing mesenteric fat and fluid, new from prior. No abdominopelvic ascites. There surgical clips in the bilateral inguinal regions. Musculoskeletal: Degenerative changes affect the spine. IMPRESSION: VASCULAR 1. No evidence for aortic dissection or aneurysm. 2. No evidence for active gastrointestinal bleeding. Aortic Atherosclerosis (ICD10-I70.0). NON-VASCULAR 1. No acute localizing process in the abdomen or pelvis. 2. Stable findings of cirrhosis and portal hypertension. 3. Cholelithiasis. 4. New small upper abdominal midline ventral hernia containing mesenteric fat and fluid. Electronically Signed   By: Darliss Cheney M.D.   On: 06/07/2023 00:05      Subjective: Seen and examined on the day of discharge.  Stable no distress.  Appropriate for discharge home.  Discharge Exam: Vitals:   06/08/23 0755 06/08/23 1224  BP: (!) 104/93 97/72  Pulse: 72   Resp: 16 16  Temp: 97.8 F (36.6 C) 97.6 F (36.4 C)  SpO2: 100% 99%   Vitals:   06/07/23 2323 06/08/23 0357 06/08/23 0755 06/08/23 1224  BP: 129/77 102/70 (!) 104/93 97/72  Pulse: 74 78 72   Resp: 18 18 16 16   Temp: (!) 97.5 F (36.4 C) 97.7 F (36.5 C) 97.8 F (36.6 C) 97.6 F (36.4 C)  TempSrc:      SpO2: 97% 100% 100% 99%  Weight:      Height:        General: Pt is alert, awake, not in acute distress Cardiovascular: RRR, S1/S2 +, no rubs, no gallops Respiratory: CTA bilaterally, no wheezing, no rhonchi Abdominal: Soft, NT, ND, bowel sounds  + Extremities: no edema, no cyanosis    The results of significant diagnostics from this hospitalization (including imaging, microbiology, ancillary and laboratory) are listed below for reference.     Microbiology: No results found for this or any previous visit (from the past 240 hour(s)).   Labs: BNP (last 3 results) Recent Labs    01/06/23 1122  BNP 425.5*   Basic Metabolic Panel: Recent Labs  Lab 06/06/23 2026  NA 138  K 3.5  CL 98  CO2 26  GLUCOSE 98  BUN 27*  CREATININE 1.15*  CALCIUM 9.7   Liver Function Tests: No results for input(s): "AST", "ALT", "ALKPHOS", "BILITOT", "PROT", "ALBUMIN" in the last 168 hours. No results for input(s): "LIPASE", "AMYLASE" in the last 168 hours. No results for input(s): "AMMONIA" in the last 168 hours. CBC: Recent Labs  Lab 06/06/23 2026 06/07/23 0258 06/07/23 1044 06/08/23 0622 06/08/23 1423  WBC 3.2*  --   --  1.7*  --   NEUTROABS 2.1  --   --  1.1*  --   HGB 12.3 10.3* 10.5* 9.9* 10.4*  HCT 37.5  --   --  29.6* 31.0*  MCV 102.2*  --   --  101.0*  --   PLT 108*  --   --  79*  --    Cardiac Enzymes: No results for input(s): "CKTOTAL", "CKMB", "CKMBINDEX", "TROPONINI" in the last 168 hours. BNP: Invalid input(s): "POCBNP" CBG: Recent Labs  Lab 06/07/23 2347 06/08/23 0014 06/08/23 0358 06/08/23 0754 06/08/23 1224  GLUCAP 51* 95 127* 133* 98   D-Dimer No results for input(s): "DDIMER" in the last 72 hours. Hgb A1c Recent Labs    06/07/23 0258  HGBA1C 6.3*   Lipid Profile No results for input(s): "CHOL", "HDL", "LDLCALC", "TRIG", "CHOLHDL", "LDLDIRECT" in the last 72 hours. Thyroid function studies No results for input(s): "TSH", "T4TOTAL", "T3FREE", "THYROIDAB" in the last 72 hours.  Invalid input(s): "FREET3" Anemia work up No results for input(s): "VITAMINB12", "FOLATE", "FERRITIN", "TIBC", "IRON", "RETICCTPCT" in the last 72 hours. Urinalysis    Component Value Date/Time   COLORURINE YELLOW (A)  06/07/2023 0550   APPEARANCEUR CLEAR (A) 06/07/2023 0550   LABSPEC 1.025 06/07/2023 0550   PHURINE 5.0 06/07/2023 0550   GLUCOSEU >=500 (A) 06/07/2023 0550   HGBUR LARGE (A) 06/07/2023 0550   BILIRUBINUR NEGATIVE 06/07/2023 0550   KETONESUR NEGATIVE 06/07/2023 0550   PROTEINUR NEGATIVE 06/07/2023 0550   NITRITE NEGATIVE 06/07/2023 0550   LEUKOCYTESUR NEGATIVE 06/07/2023 0550   Sepsis Labs Recent Labs  Lab 06/06/23 2026 06/08/23 0622  WBC 3.2* 1.7*   Microbiology No results found for this or any previous visit (from the past 240 hour(s)).   Time coordinating discharge: Over 30 minutes  SIGNED:   Tresa Moore, MD  [facility] 06/08/2023, 3:09 PM Pager   If 7PM-7AM, please contact night-coverage

## 2023-06-08 NOTE — Plan of Care (Signed)

## 2023-06-08 NOTE — Inpatient Diabetes Management (Signed)
Inpatient Diabetes Program Recommendations  AACE/ADA: New Consensus Statement on Inpatient Glycemic Control   Target Ranges:  Prepandial:   less than 140 mg/dL      Peak postprandial:   less than 180 mg/dL (1-2 hours)      Critically ill patients:  140 - 180 mg/dL    Latest Reference Range & Units 06/07/23 07:58 06/07/23 12:18 06/07/23 16:27 06/07/23 19:35 06/07/23 23:23 06/07/23 23:34 06/07/23 23:47 06/08/23 00:14 06/08/23 03:58  Glucose-Capillary 70 - 99 mg/dL 161 (H) 096 (H) 90 045 (H) 31 (LL) 46 (L) 51 (L) 95 127 (H)   Review of Glycemic Control  Diabetes history: DM2 Outpatient Diabetes medications: Jardiance 10 mg daily, Metformin 1000 mg BID Current orders for Inpatient glycemic control: Novolog 0-15 units Q4H  Inpatient Diabetes Program Recommendations:    Insulin: CBG down to 31 mg/dl at 40:98 on 12/16/12. Please consider decreasing Novolog correction to 0-9 units Q4H.   Thanks, Orlando Penner, RN, MSN, CDCES Diabetes Coordinator Inpatient Diabetes Program [phone] (Team Pager from 8am to 5pm)

## 2023-06-09 LAB — TYPE AND SCREEN
ABO/RH(D): A NEG
Antibody Screen: POSITIVE
Unit division: 0
Unit division: 0

## 2023-06-09 LAB — BPAM RBC
Blood Product Expiration Date: 202407302359
Blood Product Expiration Date: 202407302359
Unit Type and Rh: 600
Unit Type and Rh: 600

## 2023-07-04 ENCOUNTER — Encounter: Admission: RE | Payer: Self-pay | Source: Home / Self Care

## 2023-07-04 ENCOUNTER — Ambulatory Visit: Admission: RE | Admit: 2023-07-04 | Payer: Medicare Other | Source: Home / Self Care

## 2023-07-04 SURGERY — FLEXIBLE SIGMOIDOSCOPY
Anesthesia: General

## 2023-07-21 ENCOUNTER — Encounter: Payer: Self-pay | Admitting: Oncology

## 2023-07-21 ENCOUNTER — Inpatient Hospital Stay: Payer: Medicare Other | Attending: Oncology

## 2023-07-21 ENCOUNTER — Inpatient Hospital Stay: Payer: Medicare Other | Admitting: Oncology

## 2023-07-21 VITALS — BP 105/55 | HR 67 | Temp 96.5°F | Ht 61.0 in | Wt 143.0 lb

## 2023-07-21 DIAGNOSIS — D61818 Other pancytopenia: Secondary | ICD-10-CM

## 2023-07-21 DIAGNOSIS — D500 Iron deficiency anemia secondary to blood loss (chronic): Secondary | ICD-10-CM | POA: Insufficient documentation

## 2023-07-21 DIAGNOSIS — D709 Neutropenia, unspecified: Secondary | ICD-10-CM | POA: Insufficient documentation

## 2023-07-21 DIAGNOSIS — K31819 Angiodysplasia of stomach and duodenum without bleeding: Secondary | ICD-10-CM | POA: Insufficient documentation

## 2023-07-21 LAB — FERRITIN: Ferritin: 19 ng/mL (ref 11–307)

## 2023-07-21 LAB — CBC WITH DIFFERENTIAL/PLATELET
Abs Immature Granulocytes: 0.01 10*3/uL (ref 0.00–0.07)
Basophils Absolute: 0 10*3/uL (ref 0.0–0.1)
Basophils Relative: 0 %
Eosinophils Absolute: 0 10*3/uL (ref 0.0–0.5)
Eosinophils Relative: 2 %
HCT: 38.5 % (ref 36.0–46.0)
Hemoglobin: 12.2 g/dL (ref 12.0–15.0)
Immature Granulocytes: 0 %
Lymphocytes Relative: 14 %
Lymphs Abs: 0.4 10*3/uL — ABNORMAL LOW (ref 0.7–4.0)
MCH: 31.6 pg (ref 26.0–34.0)
MCHC: 31.7 g/dL (ref 30.0–36.0)
MCV: 99.7 fL (ref 80.0–100.0)
Monocytes Absolute: 0.3 10*3/uL (ref 0.1–1.0)
Monocytes Relative: 10 %
Neutro Abs: 2 10*3/uL (ref 1.7–7.7)
Neutrophils Relative %: 74 %
Platelets: 111 10*3/uL — ABNORMAL LOW (ref 150–400)
RBC: 3.86 MIL/uL — ABNORMAL LOW (ref 3.87–5.11)
RDW: 13.8 % (ref 11.5–15.5)
WBC: 2.7 10*3/uL — ABNORMAL LOW (ref 4.0–10.5)
nRBC: 0 % (ref 0.0–0.2)

## 2023-07-21 LAB — IRON AND TIBC
Iron: 72 ug/dL (ref 28–170)
Saturation Ratios: 16 % (ref 10.4–31.8)
TIBC: 438 ug/dL (ref 250–450)
UIBC: 366 ug/dL

## 2023-07-21 LAB — VITAMIN B12: Vitamin B-12: 744 pg/mL (ref 180–914)

## 2023-07-22 ENCOUNTER — Encounter: Payer: Self-pay | Admitting: Oncology

## 2023-07-22 NOTE — Progress Notes (Signed)
Hematology/Oncology Consult note [facility]  [phone] [phone]  Patient Care Team: Jerl Mina, MD as PCP - General (Family Medicine) Creig Hines, MD as Consulting Physician (Hematology and Oncology)   Name of the patient: Tammy Arias  [id number]  February 14, 1956   Date of visit: 07/22/23  Diagnosis-chronic benign neutropenia Iron deficiency anemia  Chief complaint/ Reason for visit-routine follow-up of neutropenia and iron deficiency anemia  Heme/Onc history: patient is a 67 year old Caucasian female with a past medical history significant for congestive heart failure with an EF of 35%, history of A. 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 EGD showed salmon-colored mucosa suspicious for short segment Barrett's.  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  Nonbleeding erosive gastropathy.  Normal examined duodenum.  No specimens collected.Colonoscopy showed 7 nonbleeding colonic angiectasia treated with APC.  Nonbleeding internal hemorrhoids.  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  On a couple of occasions her white count has been normal at around 4.1.  Her white count was noted to be 2.4 on her recent blood work and prior to that a month ago it was 2.7.  Differential mainly shows lymphopenia with a neutrophil count that fluctuates between 1.8-3.7.  She is also developed gradual onset thrombocytopenia over the last 2 years and her platelet counts have gradually drifted down from 1 45-1 07 in March 2022.  More recently in April she was noted to have a  platelet count of 98.   Bone marrow biopsy showed slightly hypercellular marrow with trilineage-hematopoiesis with abundant megakaryocytes with normal morphology.  Generally mild nonspecific changes involving the myeloid cell lines with no increase in blasts.  Cytogenetics and FISH studies currently pending.  Myeloma panel, B12, folate unremarkable.  ANA comprehensive panel negative.    Interval history-patient reports fatigue.  Denies any blood loss in her stool or urine  ECOG PS- 1 Pain scale- 0   Review of systems- Review of Systems  Constitutional:  Positive for malaise/fatigue. Negative for chills, fever and weight loss.  HENT:  Negative for congestion, ear discharge and nosebleeds.   Eyes:  Negative for blurred vision.  Respiratory:  Negative for cough, hemoptysis, sputum production, shortness of breath and wheezing.   Cardiovascular:  Negative for chest pain, palpitations, orthopnea and claudication.  Gastrointestinal:  Negative for abdominal pain, blood in stool, constipation, diarrhea, heartburn, melena, nausea and vomiting.  Genitourinary:  Negative for dysuria, flank pain, frequency, hematuria and urgency.  Musculoskeletal:  Negative for back pain, joint pain and myalgias.  Skin:  Negative for rash.  Neurological:  Negative for dizziness, tingling, focal weakness, seizures, weakness and headaches.  Endo/Heme/Allergies:  Does not bruise/bleed easily.  Psychiatric/Behavioral:  Negative for depression and suicidal ideas. The patient does not have insomnia.       Allergies  Allergen Reactions   Vioxx [Rofecoxib] Swelling     Past Medical History:  Diagnosis Date   AC (acromioclavicular) joint bone spurs, right    Anemia    Arthritis    Atrial fibrillation (HCC)    a.) CHA2DS2-VASc = 7 (age, sex, HFrEF, HTN, TIA x 2, T2DM).  b.) rate/rhythm controlled on oral carvedilol; chronically anticoagulated with warfarin.   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 b.) R/LHC 03/29/2017: small RCA with occluded SVG; no significant Dz. Chronically occluded LAD with patent SVG to D1/LAD. Insignificant Dz in LCx. LM normal.   Cortical cataract    DOE (dyspnea on exertion)    DOE (dyspnea on exertion)    GERD (gastroesophageal reflux disease)    HFrEF (heart failure with reduced ejection fraction) (HCC)    a.) TTE 10/27/2014: EF 40%; glob HK; mild BAE; triv AR, mild MR/PR, mod TR.  b.) TTE 03/15/2017: EF 30%; glob HK; mod BAE; mod pHTN (RVSP 54.8 mmHg); mild PR, mod MR; sev TR.  c.) R/LHC 03/29/2017: EF 30-35%. d.)TTE 10/25/2018: EF 35%; mod BAE; mod BVE; glob HK; triv PR, mod MR, sev TR; RVSP 57.7 mmHg; G2DD.   History of shingles 2004   Hyperlipidemia    Hypertension    Hypothyroidism    IBS (irritable bowel syndrome)    Ischemic cardiomyopathy    a.) TTE 10/27/2014: EF 40%. b.) TTE 03/15/2017: EF 30%. c.) R/LHC 04/01/2017: EF 30-35%. d.) TTE 10/25/2018: EF 35%   Lumbar scoliosis    Lumbar spinal stenosis    Migraines    Osteoporosis    Pulmonary HTN (HCC)    a.) TTE 03/15/2017: EF 30-35%; RVSP 54.8 mmHg. 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   Sleep apnea    T2DM (type 2 diabetes mellitus) (HCC)    TIA (transient ischemic attack) 05/29/2016   Vitamin B 12 deficiency      Past Surgical History:  Procedure Laterality Date   CARDIAC CATHETERIZATION     CATARACT EXTRACTION W/ INTRAOCULAR LENS IMPLANT Bilateral    Cataract Extraction with IOL   COLONOSCOPY N/A 12/19/2018   Procedure: COLONOSCOPY;  Surgeon: Pasty Spillers, MD;  Location: [facility] ENDOSCOPY;  Service: Endoscopy;  Laterality: N/A;   COLONOSCOPY WITH PROPOFOL N/A 06/17/2020   Procedure: COLONOSCOPY WITH PROPOFOL;  Surgeon: Pasty Spillers, MD;  Location: [facility] ENDOSCOPY;  Service: Endoscopy;  Laterality: N/A;   COLONOSCOPY WITH PROPOFOL N/A 03/30/2023   Procedure: COLONOSCOPY WITH PROPOFOL;  Surgeon: Jaynie Collins, DO;  Location: [facility] ENDOSCOPY;  Service: Endoscopy;  Laterality: N/A;   COR TRIATRIATUM REPAIR N/A 01/28/2004   CORONARY ARTERY BYPASS GRAFT N/A 01/28/2004   3v CABG   ESOPHAGOGASTRODUODENOSCOPY N/A 12/19/2018   Procedure: ESOPHAGOGASTRODUODENOSCOPY (EGD);  Surgeon: Pasty Spillers, MD;  Location: [facility] ENDOSCOPY;  Service: Endoscopy;  Laterality: N/A;   ESOPHAGOGASTRODUODENOSCOPY (EGD) WITH PROPOFOL N/A 06/17/2020   Procedure: ESOPHAGOGASTRODUODENOSCOPY (EGD) WITH PROPOFOL;  Surgeon: Pasty Spillers, MD;  Location: [facility] ENDOSCOPY;  Service: Endoscopy;  Laterality: N/A;   ESOPHAGOGASTRODUODENOSCOPY (EGD) WITH PROPOFOL N/A 03/30/2023   Procedure: ESOPHAGOGASTRODUODENOSCOPY (EGD) WITH PROPOFOL;  Surgeon: Jaynie Collins, DO;  Location: [facility] ENDOSCOPY;  Service: Endoscopy;  Laterality: N/A;   RIGHT/LEFT HEART CATH AND CORONARY ANGIOGRAPHY Bilateral 03/29/2017   Procedure: Right/Left Heart Cath and Coronary Angiography;  Surgeon: Dalia Heading, MD;  Location: [facility] INVASIVE CV LAB;  Service: Cardiovascular;  Laterality: Bilateral;   TOTAL KNEE ARTHROPLASTY Right 04/05/2022   Procedure: TOTAL KNEE  ARTHROPLASTY;  Surgeon: Kennedy Bucker, MD;  Location: [facility] ORS;  Service: Orthopedics;  Laterality: Right;   TUBAL LIGATION     VENTRAL HERNIA REPAIR N/A 04/21/2017   Procedure: HERNIA REPAIR VENTRAL ADULT;  Surgeon: Nadeen Landau, MD;  Location: [facility] ORS;  Service: General;  Laterality: N/A;    Social History   Socioeconomic History   Marital status: Widowed    Spouse name: Not on file   Number of children: Not on file   Years of education: Not on file   Highest education level: Not on file  Occupational History   Occupation: certified nursing assistant  Tobacco Use   Smoking status:  Former    Current packs/day: 0.00    Types: Cigarettes    Quit date: 03/30/1999    Years since quitting: 24.3   Smokeless tobacco: Never  Vaping Use   Vaping status: Never Used  Substance and Sexual Activity   Alcohol use: No   Drug use: No   Sexual activity: Not Currently  Other Topics Concern   Not on file  Social History Narrative   Not on file   Social Determinants of Health   Financial Resource Strain: Patient Declined (05/23/2023)   Received from [facility], [facility]   Overall Financial Resource Strain (CARDIA)    Difficulty of Paying Living Expenses: Patient declined  Food Insecurity: No Food Insecurity (06/07/2023)   Hunger Vital Sign    Worried About Running Out of Food in the Last Year: Never true    Ran Out of Food in the Last Year: Never true  Transportation Needs: No Transportation Needs (06/07/2023)   PRAPARE - Administrator, Civil Service (Medical): No    Lack of Transportation (Non-Medical): No  Physical Activity: Not on file  Stress: Not on file  Social Connections: Not on file  Intimate Partner Violence: Not At Risk (06/07/2023)   Humiliation, Afraid, Rape, and Kick questionnaire    Fear of Current or Ex-Partner: No    Emotionally Abused: No    Physically Abused: No    Sexually Abused: No    Family History  Problem Relation Age of Onset   Valvular heart disease Mother    Diabetes Father    Cancer Sister        lung   Cancer Sister        cervical   Breast cancer Cousin      Current Outpatient Medications:    albuterol (PROVENTIL HFA;VENTOLIN HFA) 108 (90 Base) MCG/ACT inhaler, Inhale 2 puffs into the lungs every 6 (six) hours as needed for wheezing or shortness of breath., Disp: , Rfl:    carvedilol (COREG) 6.25 MG tablet, Take 6.25 mg by mouth 2 (two) times daily with a meal., Disp: , Rfl:    ELIQUIS 5 MG TABS tablet, Take 5 mg by mouth 2 (two) times daily., Disp: , Rfl:    fluticasone (FLONASE)  50 MCG/ACT nasal spray, Place 1 spray into both nostrils at bedtime., Disp: , Rfl:    furosemide (LASIX) 40 MG tablet, Take 40 mg by mouth daily. Pt stated she take 40 mg once per day, Disp: , Rfl:    JARDIANCE 10 MG TABS tablet, Take 10 mg by mouth daily., Disp: , Rfl:    latanoprost (XALATAN) 0.005 % ophthalmic solution, Place 1 drop into both eyes at bedtime., Disp: , Rfl:    levothyroxine (SYNTHROID) 50 MCG tablet, Take 50 mcg by mouth daily before breakfast.,  Disp: , Rfl:    lovastatin (MEVACOR) 10 MG tablet, Take 10 mg by mouth at bedtime., Disp: , Rfl:    metFORMIN (GLUCOPHAGE) 1000 MG tablet, Take 1,000 mg by mouth 2 (two) times daily with a meal., Disp: , Rfl:    ONETOUCH ULTRA test strip, , Disp: , Rfl:    pantoprazole (PROTONIX) 40 MG tablet, Take 40 mg by mouth daily., Disp: , Rfl:    polyethylene glycol-electrolytes (NULYTELY) 420 g solution, Take by mouth., Disp: , Rfl:    predniSONE (DELTASONE) 10 MG tablet, Take by mouth., Disp: , Rfl:    sacubitril-valsartan (ENTRESTO) 49-51 MG, Take 1 tablet by mouth 2 (two) times daily., Disp: , Rfl:    spironolactone (ALDACTONE) 25 MG tablet, Take 25 mg by mouth daily., Disp: , Rfl:    vitamin B-12 (CYANOCOBALAMIN) 1000 MCG tablet, Take 1,000 mcg by mouth daily., Disp: , Rfl:    aspirin EC 81 MG tablet, Take 81 mg by mouth daily. (Patient not taking: Reported on 07/21/2023), Disp: , Rfl:   Physical exam:  Vitals:   07/21/23 1117  BP: (!) 105/55  Pulse: 67  Temp: (!) 96.5 F (35.8 C)  TempSrc: Tympanic  SpO2: 98%  Weight: 143 lb (64.9 kg)  Height: 5\' 1"  (1.549 m)   Physical Exam Cardiovascular:     Rate and Rhythm: Normal rate and regular rhythm.     Heart sounds: Normal heart sounds.  Pulmonary:     Effort: Pulmonary effort is normal.     Breath sounds: Normal breath sounds.  Skin:    General: Skin is warm and dry.  Neurological:     Mental Status: She is alert and oriented to person, place, and time.         Latest Ref  Rng & Units 06/06/2023    8:26 PM  CMP  Glucose 70 - 99 mg/dL 98   BUN 8 - 23 mg/dL 27   Creatinine 2.95 - 1.00 mg/dL 2.84   Sodium 132 - 440 mmol/L 138   Potassium 3.5 - 5.1 mmol/L 3.5   Chloride 98 - 111 mmol/L 98   CO2 22 - 32 mmol/L 26   Calcium 8.9 - 10.3 mg/dL 9.7       Latest Ref Rng & Units 07/21/2023   10:40 AM  CBC  WBC 4.0 - 10.5 K/uL 2.7   Hemoglobin 12.0 - 15.0 g/dL 10.2   Hematocrit 72.5 - 46.0 % 38.5   Platelets 150 - 400 K/uL 111     No images are attached to the encounter.  No results found.   Assessment and plan- Patient is a 67 y.o. female here for follow-up of following issues  Iron deficiencyAnemia: Patient is not anemic with a hemoglobin of 12.2.  However ferritin levels are low at 19 with iron saturation of 16%.  We will proceed with 2 doses of Feraheme at this time given her fatigue.  Neutropenia: Chronic.  She has undergone bone marrow biopsy also 2 years ago which did not show any evidence of primary bone marrow disorder.  Labs in 4 and 8 months and I will see her back in 8 months   Visit Diagnosis 1. Iron deficiency anemia due to chronic blood loss   2. Chronic neutropenia (HCC)      Dr. Owens Shark, MD, MPH [facility] [phone] 07/22/2023 3:37 PM

## 2023-07-26 ENCOUNTER — Inpatient Hospital Stay: Payer: Medicare Other

## 2023-07-26 VITALS — BP 97/62 | HR 61 | Temp 97.8°F

## 2023-07-26 DIAGNOSIS — D500 Iron deficiency anemia secondary to blood loss (chronic): Secondary | ICD-10-CM

## 2023-07-26 MED ORDER — SODIUM CHLORIDE 0.9 % IV SOLN
510.0000 mg | INTRAVENOUS | Status: DC
  Filled 2023-07-26: qty 17

## 2023-07-26 MED ORDER — SODIUM CHLORIDE 0.9 % IV SOLN
Freq: Once | INTRAVENOUS | Status: AC
  Filled 2023-07-26: qty 250

## 2023-07-26 MED ORDER — SODIUM CHLORIDE 0.9 % IV SOLN
510.0000 mg | Freq: Once | INTRAVENOUS | Status: AC
  Administered 2023-07-26: 510 mg via INTRAVENOUS
  Filled 2023-07-26: qty 17

## 2023-08-07 ENCOUNTER — Inpatient Hospital Stay: Payer: Medicare Other | Attending: Oncology

## 2023-08-07 VITALS — BP 92/47 | HR 62 | Temp 97.9°F | Resp 18

## 2023-08-07 DIAGNOSIS — D500 Iron deficiency anemia secondary to blood loss (chronic): Secondary | ICD-10-CM | POA: Insufficient documentation

## 2023-08-07 DIAGNOSIS — D708 Other neutropenia: Secondary | ICD-10-CM | POA: Insufficient documentation

## 2023-08-07 MED ORDER — SODIUM CHLORIDE 0.9 % IV SOLN
510.0000 mg | INTRAVENOUS | Status: DC
  Administered 2023-08-07: 510 mg via INTRAVENOUS
  Filled 2023-08-07: qty 17

## 2023-08-07 MED ORDER — SODIUM CHLORIDE 0.9 % IV SOLN
Freq: Once | INTRAVENOUS | Status: AC
  Filled 2023-08-07: qty 250

## 2023-11-20 ENCOUNTER — Inpatient Hospital Stay: Payer: Medicare Other | Attending: Oncology

## 2023-11-20 ENCOUNTER — Other Ambulatory Visit: Payer: Self-pay | Admitting: Oncology

## 2023-11-20 DIAGNOSIS — D709 Neutropenia, unspecified: Secondary | ICD-10-CM | POA: Diagnosis not present

## 2023-11-20 DIAGNOSIS — D500 Iron deficiency anemia secondary to blood loss (chronic): Secondary | ICD-10-CM

## 2023-11-20 DIAGNOSIS — D509 Iron deficiency anemia, unspecified: Secondary | ICD-10-CM | POA: Insufficient documentation

## 2023-11-20 LAB — IRON AND TIBC
Iron: 106 ug/dL (ref 28–170)
Saturation Ratios: 22 % (ref 10.4–31.8)
TIBC: 487 ug/dL — ABNORMAL HIGH (ref 250–450)
UIBC: 381 ug/dL

## 2023-11-20 LAB — CBC WITH DIFFERENTIAL/PLATELET
Abs Immature Granulocytes: 0.01 10*3/uL (ref 0.00–0.07)
Basophils Absolute: 0 10*3/uL (ref 0.0–0.1)
Basophils Relative: 1 %
Eosinophils Absolute: 0.2 10*3/uL (ref 0.0–0.5)
Eosinophils Relative: 8 %
HCT: 39.4 % (ref 36.0–46.0)
Hemoglobin: 12.4 g/dL (ref 12.0–15.0)
Immature Granulocytes: 0 %
Lymphocytes Relative: 10 %
Lymphs Abs: 0.3 10*3/uL — ABNORMAL LOW (ref 0.7–4.0)
MCH: 33.1 pg (ref 26.0–34.0)
MCHC: 31.5 g/dL (ref 30.0–36.0)
MCV: 105.1 fL — ABNORMAL HIGH (ref 80.0–100.0)
Monocytes Absolute: 0.3 10*3/uL (ref 0.1–1.0)
Monocytes Relative: 11 %
Neutro Abs: 2 10*3/uL (ref 1.7–7.7)
Neutrophils Relative %: 70 %
Platelets: 86 10*3/uL — ABNORMAL LOW (ref 150–400)
RBC: 3.75 MIL/uL — ABNORMAL LOW (ref 3.87–5.11)
RDW: 12.8 % (ref 11.5–15.5)
WBC: 2.9 10*3/uL — ABNORMAL LOW (ref 4.0–10.5)
nRBC: 0 % (ref 0.0–0.2)

## 2023-11-20 LAB — FERRITIN: Ferritin: 28 ng/mL (ref 11–307)

## 2023-11-21 ENCOUNTER — Other Ambulatory Visit: Payer: Self-pay | Admitting: Oncology

## 2023-11-29 DIAGNOSIS — N1832 Chronic kidney disease, stage 3b: Secondary | ICD-10-CM | POA: Diagnosis present

## 2023-12-04 ENCOUNTER — Other Ambulatory Visit: Payer: Self-pay | Admitting: Nurse Practitioner

## 2023-12-06 ENCOUNTER — Telehealth: Payer: Self-pay | Admitting: *Deleted

## 2023-12-06 NOTE — Telephone Encounter (Signed)
 I called to ask if pt wants to get IV iron based on ferritin was 28. The pt and daughter said that someone called today and will come in 1/9 and 1/16 at 1:30. They are ok with the appts

## 2023-12-07 ENCOUNTER — Inpatient Hospital Stay: Payer: Medicare Other | Attending: Oncology

## 2023-12-07 VITALS — BP 103/50 | HR 63 | Temp 96.9°F | Resp 18

## 2023-12-07 DIAGNOSIS — D509 Iron deficiency anemia, unspecified: Secondary | ICD-10-CM | POA: Diagnosis present

## 2023-12-07 DIAGNOSIS — D500 Iron deficiency anemia secondary to blood loss (chronic): Secondary | ICD-10-CM

## 2023-12-07 MED ORDER — SODIUM CHLORIDE 0.9 % IV SOLN
510.0000 mg | Freq: Once | INTRAVENOUS | Status: AC
  Administered 2023-12-07: 510 mg via INTRAVENOUS
  Filled 2023-12-07: qty 510

## 2023-12-07 MED ORDER — SODIUM CHLORIDE 0.9 % IV SOLN
INTRAVENOUS | Status: DC
Start: 2023-12-07 — End: 2023-12-07
  Filled 2023-12-07: qty 250

## 2023-12-14 ENCOUNTER — Inpatient Hospital Stay: Payer: Medicare Other

## 2023-12-14 ENCOUNTER — Telehealth: Payer: Self-pay | Admitting: Oncology

## 2023-12-14 NOTE — Telephone Encounter (Signed)
Patient left voicemail to cancel appointment- she is in the hospital at [facility]- appointment cancelled

## 2024-01-19 ENCOUNTER — Other Ambulatory Visit: Payer: Self-pay

## 2024-01-19 ENCOUNTER — Telehealth: Payer: Self-pay | Admitting: *Deleted

## 2024-01-19 DIAGNOSIS — D500 Iron deficiency anemia secondary to blood loss (chronic): Secondary | ICD-10-CM

## 2024-01-19 DIAGNOSIS — D708 Other neutropenia: Secondary | ICD-10-CM

## 2024-01-19 NOTE — Telephone Encounter (Signed)
We dont have iron studies for her. I will need ferritin and iron studies b12 and folate before deciding if she needs iv iron. Her tsh was high and could be contributing to her anemia

## 2024-01-19 NOTE — Telephone Encounter (Signed)
I called and explained to patient & she says she is not sure if PCP is handing high TSH so I will reach out to PCP for her. She says she is continuously feeling fatigue and dizzy and asking if we can move lab and MD apt up. Tammy Arias can you please reach out to her to schedule

## 2024-01-19 NOTE — Telephone Encounter (Signed)
Pt. States that she has labs on 2/18 and saw hgb, wbc, iron. The hgb was 9.7. and she is wanting when she is getting iron infusions

## 2024-01-26 ENCOUNTER — Inpatient Hospital Stay: Payer: Medicare Other | Admitting: Oncology

## 2024-01-26 ENCOUNTER — Inpatient Hospital Stay: Payer: Medicare Other

## 2024-02-20 ENCOUNTER — Other Ambulatory Visit
Admission: RE | Admit: 2024-02-20 | Discharge: 2024-02-20 | Disposition: A | Discharge status: Home / Self Care | Source: Ambulatory Visit | Attending: Internal Medicine | Admitting: Internal Medicine

## 2024-02-20 DIAGNOSIS — I5023 Acute on chronic systolic (congestive) heart failure: Secondary | ICD-10-CM | POA: Diagnosis present

## 2024-02-20 LAB — BRAIN NATRIURETIC PEPTIDE: B Natriuretic Peptide: 199.8 pg/mL — ABNORMAL HIGH (ref 0.0–100.0)

## 2024-02-21 ENCOUNTER — Other Ambulatory Visit: Payer: Self-pay

## 2024-02-21 DIAGNOSIS — D500 Iron deficiency anemia secondary to blood loss (chronic): Secondary | ICD-10-CM

## 2024-02-23 ENCOUNTER — Inpatient Hospital Stay: Payer: Medicare Other | Admitting: Oncology

## 2024-02-23 ENCOUNTER — Inpatient Hospital Stay: Payer: Medicare Other | Attending: Oncology

## 2024-02-23 ENCOUNTER — Encounter: Payer: Self-pay | Admitting: Oncology

## 2024-02-23 VITALS — BP 91/55 | HR 96 | Temp 96.4°F | Resp 19 | Ht 61.0 in | Wt 126.9 lb

## 2024-02-23 DIAGNOSIS — D500 Iron deficiency anemia secondary to blood loss (chronic): Secondary | ICD-10-CM

## 2024-02-23 DIAGNOSIS — D708 Other neutropenia: Secondary | ICD-10-CM

## 2024-02-23 DIAGNOSIS — Z87891 Personal history of nicotine dependence: Secondary | ICD-10-CM | POA: Insufficient documentation

## 2024-02-23 DIAGNOSIS — D709 Neutropenia, unspecified: Secondary | ICD-10-CM | POA: Insufficient documentation

## 2024-02-23 LAB — CBC WITH DIFFERENTIAL (CANCER CENTER ONLY)
Abs Immature Granulocytes: 0.04 10*3/uL (ref 0.00–0.07)
Basophils Absolute: 0.1 10*3/uL (ref 0.0–0.1)
Basophils Relative: 1 %
Eosinophils Absolute: 0.2 10*3/uL (ref 0.0–0.5)
Eosinophils Relative: 2 %
HCT: 37.6 % (ref 36.0–46.0)
Hemoglobin: 12.4 g/dL (ref 12.0–15.0)
Immature Granulocytes: 1 %
Lymphocytes Relative: 6 %
Lymphs Abs: 0.4 10*3/uL — ABNORMAL LOW (ref 0.7–4.0)
MCH: 31.6 pg (ref 26.0–34.0)
MCHC: 33 g/dL (ref 30.0–36.0)
MCV: 95.7 fL (ref 80.0–100.0)
Monocytes Absolute: 0.7 10*3/uL (ref 0.1–1.0)
Monocytes Relative: 10 %
Neutro Abs: 5.7 10*3/uL (ref 1.7–7.7)
Neutrophils Relative %: 80 %
Platelet Count: 145 10*3/uL — ABNORMAL LOW (ref 150–400)
RBC: 3.93 MIL/uL (ref 3.87–5.11)
RDW: 17.5 % — ABNORMAL HIGH (ref 11.5–15.5)
WBC Count: 7 10*3/uL (ref 4.0–10.5)
nRBC: 0 % (ref 0.0–0.2)

## 2024-02-23 LAB — VITAMIN B12: Vitamin B-12: 1463 pg/mL — ABNORMAL HIGH (ref 180–914)

## 2024-02-23 LAB — IRON AND TIBC
Iron: 36 ug/dL (ref 28–170)
Saturation Ratios: 10 % — ABNORMAL LOW (ref 10.4–31.8)
TIBC: 374 ug/dL (ref 250–450)
UIBC: 338 ug/dL

## 2024-02-23 LAB — FERRITIN: Ferritin: 327 ng/mL — ABNORMAL HIGH (ref 11–307)

## 2024-02-23 LAB — FOLATE: Folate: 17.7 ng/mL (ref >5.9)

## 2024-02-24 ENCOUNTER — Encounter: Payer: Self-pay | Admitting: Oncology

## 2024-02-24 NOTE — Progress Notes (Signed)
 Hematology/Oncology Consult note [facility]  [phone] [phone]  Patient Care Team: Jerl Mina, MD as PCP - General (Family Medicine) Creig Hines, MD as Consulting Physician (Hematology and Oncology)   Name of the patient: Tammy Arias  [id number]  12-06-1955   Date of visit: 02/24/24  Diagnosis- chronic benign neutropenia Iron deficiency anemia    Chief complaint/ Reason for visit-routine follow-up of neutropenia and iron deficiency anemia  Heme/Onc history: patient is a 68 year old Caucasian female with a past medical history significant for congestive heart failure with an EF of 35%, history of A. 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 EGD showed salmon-colored mucosa suspicious for short segment Barrett's.  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  Nonbleeding erosive gastropathy.  Normal examined duodenum.  No specimens collected.Colonoscopy showed 7 nonbleeding colonic angiectasia treated with APC.  Nonbleeding internal hemorrhoids.  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  On a couple of occasions her white count has been normal at around 4.1.  Her white count was noted to be 2.4 on her recent blood work and prior to that a month ago it was 2.7.  Differential mainly shows lymphopenia with a neutrophil count that fluctuates between 1.8-3.7.  She is also developed gradual onset thrombocytopenia over the last 2 years and her platelet counts have gradually drifted down from 1 45-1 07 in March 2022.  More recently in April she was noted to have  a platelet count of 98.   Bone marrow biopsy showed slightly hypercellular marrow with trilineage-hematopoiesis with abundant megakaryocytes with normal morphology.  Generally mild nonspecific changes involving the myeloid cell lines with no increase in blasts.  Cytogenetics and FISH studies currently pending.  Myeloma panel, B12, folate unremarkable.  ANA comprehensive panel negative.  Interval history-patient was recently discharged after being admitted for 2 weeks for heart failure and fluid overload.  She is gradually feeling better.  ECOG PS- 2 Pain scale- 0   Review of systems- Review of Systems  Constitutional:  Negative for chills, fever, malaise/fatigue and weight loss.  HENT:  Negative for congestion, ear discharge and nosebleeds.   Eyes:  Negative for blurred vision.  Respiratory:  Negative for cough, hemoptysis, sputum production, shortness of breath and wheezing.   Cardiovascular:  Negative for chest pain, palpitations, orthopnea and claudication.  Gastrointestinal:  Negative for abdominal pain, blood in stool, constipation, diarrhea, heartburn, melena, nausea and vomiting.  Genitourinary:  Negative for dysuria, flank pain, frequency, hematuria and urgency.  Musculoskeletal:  Negative for back pain, joint pain and myalgias.  Skin:  Negative for rash.  Neurological:  Negative for dizziness, tingling, focal weakness, seizures, weakness and headaches.  Endo/Heme/Allergies:  Does not bruise/bleed easily.  Psychiatric/Behavioral:  Negative for depression and suicidal ideas. The patient does not have insomnia.       Allergies  Allergen Reactions   Vioxx [Rofecoxib] Swelling     Past Medical History:  Diagnosis Date   AC (acromioclavicular) joint bone spurs, right    Anemia    Arthritis    Atrial fibrillation (HCC)    a.) CHA2DS2-VASc = 7 (  age, sex, HFrEF, HTN, TIA x 2, T2DM). b.) rate/rhythm controlled on oral carvedilol; chronically anticoagulated with warfarin.    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 b.) R/LHC 03/29/2017: small RCA with occluded SVG; no significant Dz. Chronically occluded LAD with patent SVG to D1/LAD. Insignificant Dz in LCx. LM normal.   Cortical cataract    DOE (dyspnea on exertion)    DOE (dyspnea on exertion)    GERD (gastroesophageal reflux disease)    HFrEF (heart failure with reduced ejection fraction) (HCC)    a.) TTE 10/27/2014: EF 40%; glob HK; mild BAE; triv AR, mild MR/PR, mod TR.  b.) TTE 03/15/2017: EF 30%; glob HK; mod BAE; mod pHTN (RVSP 54.8 mmHg); mild PR, mod MR; sev TR.  c.) R/LHC 03/29/2017: EF 30-35%. d.)TTE 10/25/2018: EF 35%; mod BAE; mod BVE; glob HK; triv PR, mod MR, sev TR; RVSP 57.7 mmHg; G2DD.   History of shingles 2004   Hyperlipidemia    Hypertension    Hypothyroidism    IBS (irritable bowel syndrome)    Ischemic cardiomyopathy    a.) TTE 10/27/2014: EF 40%. b.) TTE 03/15/2017: EF 30%. c.) R/LHC 04/01/2017: EF 30-35%. d.) TTE 10/25/2018: EF 35%   Lumbar scoliosis    Lumbar spinal stenosis    Migraines    Osteoporosis    Pulmonary HTN (HCC)    a.) TTE 03/15/2017: EF 30-35%; RVSP 54.8 mmHg. 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   Sleep apnea    T2DM (type 2 diabetes mellitus) (HCC)    TIA (transient ischemic attack) 05/29/2016   Vitamin B 12 deficiency      Past Surgical History:  Procedure Laterality Date   CARDIAC CATHETERIZATION     CATARACT EXTRACTION W/ INTRAOCULAR LENS IMPLANT Bilateral    Cataract Extraction with IOL   COLONOSCOPY N/A 12/19/2018   Procedure: COLONOSCOPY;  Surgeon: Pasty Spillers, MD;  Location: [facility] ENDOSCOPY;  Service: Endoscopy;  Laterality: N/A;   COLONOSCOPY WITH PROPOFOL N/A 06/17/2020    Procedure: COLONOSCOPY WITH PROPOFOL;  Surgeon: Pasty Spillers, MD;  Location: [facility] ENDOSCOPY;  Service: Endoscopy;  Laterality: N/A;   COLONOSCOPY WITH PROPOFOL N/A 03/30/2023   Procedure: COLONOSCOPY WITH PROPOFOL;  Surgeon: Jaynie Collins, DO;  Location: [facility] ENDOSCOPY;  Service: Endoscopy;  Laterality: N/A;   COR TRIATRIATUM REPAIR N/A 01/28/2004   CORONARY ARTERY BYPASS GRAFT N/A 01/28/2004   3v CABG   ESOPHAGOGASTRODUODENOSCOPY N/A 12/19/2018   Procedure: ESOPHAGOGASTRODUODENOSCOPY (EGD);  Surgeon: Pasty Spillers, MD;  Location: [facility] ENDOSCOPY;  Service: Endoscopy;  Laterality: N/A;   ESOPHAGOGASTRODUODENOSCOPY (EGD) WITH PROPOFOL N/A 06/17/2020   Procedure: ESOPHAGOGASTRODUODENOSCOPY (EGD) WITH PROPOFOL;  Surgeon: Pasty Spillers, MD;  Location: [facility] ENDOSCOPY;  Service: Endoscopy;  Laterality: N/A;   ESOPHAGOGASTRODUODENOSCOPY (EGD) WITH PROPOFOL N/A 03/30/2023   Procedure: ESOPHAGOGASTRODUODENOSCOPY (EGD) WITH PROPOFOL;  Surgeon: Jaynie Collins, DO;  Location: [facility] ENDOSCOPY;  Service: Endoscopy;  Laterality: N/A;   RIGHT/LEFT HEART CATH AND CORONARY ANGIOGRAPHY Bilateral 03/29/2017   Procedure: Right/Left Heart Cath and Coronary Angiography;  Surgeon: Dalia Heading, MD;  Location: [facility] INVASIVE CV LAB;  Service: Cardiovascular;  Laterality: Bilateral;   TOTAL KNEE  ARTHROPLASTY Right 04/05/2022   Procedure: TOTAL KNEE ARTHROPLASTY;  Surgeon: Kennedy Bucker, MD;  Location: [facility] ORS;  Service: Orthopedics;  Laterality: Right;   TUBAL LIGATION     VENTRAL HERNIA REPAIR N/A 04/21/2017   Procedure: HERNIA REPAIR VENTRAL ADULT;  Surgeon: Nadeen Landau, MD;  Location: [facility] ORS;  Service: General;  Laterality: N/A;    Social History   Socioeconomic History   Marital status: Widowed    Spouse name: Not on file   Number of children: Not on file   Years of education: Not on file   Highest education level: Not on file  Occupational History   Occupation:  certified nursing assistant  Tobacco Use   Smoking status: Former    Current packs/day: 0.00    Types: Cigarettes    Quit date: 03/30/1999    Years since quitting: 24.9   Smokeless tobacco: Never  Vaping Use   Vaping status: Never Used  Substance and Sexual Activity   Alcohol use: No   Drug use: No   Sexual activity: Not Currently  Other Topics Concern   Not on file  Social History Narrative   Not on file   Social Drivers of Health   Financial Resource Strain: Low Risk  (02/11/2024)   Received from [facility]   Overall Financial Resource Strain (CARDIA)    Difficulty of Paying Living Expenses: Not very hard  Recent Concern: Financial Resource Strain - Medium Risk (12/13/2023)   Received from [facility]   Overall Financial Resource Strain (CARDIA)    Difficulty of Paying Living Expenses: Somewhat hard  Food Insecurity: No Food Insecurity (02/11/2024)   Received from [facility]   Hunger Vital Sign    Worried About Running Out of Food in the Last Year: Never true    Ran Out of Food in the Last Year: Never true  Recent Concern: Food Insecurity - Food Insecurity Present (12/13/2023)   Received from [facility]   Hunger Vital Sign    Ran Out of Food in the Last Year: Sometimes true    Worried About Running Out of Food in the Last Year: Never true  Transportation Needs: No Transportation Needs (02/11/2024)   Received from [facility] - Transportation    In the past 12 months, has lack of transportation kept you from medical appointments or from getting medications?: No    Lack of Transportation (Non-Medical): No  Physical Activity: Not on file  Stress: Not on file  Social Connections: Not on file  Intimate Partner Violence: Not At Risk (06/07/2023)   Humiliation, Afraid, Rape, and Kick questionnaire    Fear of Current or Ex-Partner: No    Emotionally Abused: No    Physically  Abused: No    Sexually Abused: No    Family History  Problem Relation Age of Onset   Valvular heart disease Mother    Diabetes Father    Cancer Sister        lung   Cancer Sister        cervical   Breast cancer Cousin      Current Outpatient Medications:    albuterol (PROVENTIL HFA;VENTOLIN HFA) 108 (90 Base) MCG/ACT inhaler, Inhale 2 puffs into the lungs every 6 (six) hours as needed for wheezing or shortness of breath., Disp: , Rfl:    ELIQUIS 5 MG TABS tablet, Take 5 mg by mouth 2 (two) times daily., Disp: , Rfl:  fluticasone (FLONASE) 50 MCG/ACT nasal spray, Place 1 spray into both nostrils at bedtime., Disp: , Rfl:    JARDIANCE 10 MG TABS tablet, Take 10 mg by mouth daily., Disp: , Rfl:    latanoprost (XALATAN) 0.005 % ophthalmic solution, Place 1 drop into both eyes at bedtime., Disp: , Rfl:    levothyroxine (SYNTHROID) 50 MCG tablet, Take 50 mcg by mouth daily before breakfast., Disp: , Rfl:    metFORMIN (GLUCOPHAGE) 1000 MG tablet, Take 1,000 mg by mouth 2 (two) times daily with a meal., Disp: , Rfl:    ONETOUCH ULTRA test strip, , Disp: , Rfl:    pantoprazole (PROTONIX) 40 MG tablet, Take 40 mg by mouth daily., Disp: , Rfl:    rosuvastatin (CRESTOR) 5 MG tablet, Take by mouth., Disp: , Rfl:    spironolactone (ALDACTONE) 25 MG tablet, Take 25 mg by mouth daily., Disp: , Rfl:    torsemide (DEMADEX) 20 MG tablet, Take by mouth., Disp: , Rfl:    valsartan (DIOVAN) 40 MG tablet, Take by mouth., Disp: , Rfl:    vitamin B-12 (CYANOCOBALAMIN) 1000 MCG tablet, Take 1,000 mcg by mouth daily., Disp: , Rfl:    aspirin EC 81 MG tablet, Take 81 mg by mouth daily. (Patient not taking: Reported on 07/21/2023), Disp: , Rfl:    carvedilol (COREG) 6.25 MG tablet, Take 6.25 mg by mouth 2 (two) times daily with a meal. (Patient not taking: Reported on 02/23/2024), Disp: , Rfl:    furosemide (LASIX) 40 MG tablet, Take 40 mg by mouth daily. Pt stated she take 40 mg once per day (Patient not  taking: Reported on 02/23/2024), Disp: , Rfl:    lovastatin (MEVACOR) 10 MG tablet, Take 10 mg by mouth at bedtime. (Patient not taking: Reported on 02/23/2024), Disp: , Rfl:    polyethylene glycol-electrolytes (NULYTELY) 420 g solution, Take by mouth. (Patient not taking: Reported on 02/23/2024), Disp: , Rfl:    sacubitril-valsartan (ENTRESTO) 49-51 MG, Take 1 tablet by mouth 2 (two) times daily. (Patient not taking: Reported on 02/23/2024), Disp: , Rfl:   Physical exam:  Vitals:   02/23/24 1514  BP: (!) 91/55  Pulse: 96  Resp: 19  Temp: (!) 96.4 F (35.8 C)  TempSrc: Tympanic  SpO2: 100%  Weight: 126 lb 14.4 oz (57.6 kg)  Height: 5\' 1"  (1.549 m)   Physical Exam Cardiovascular:     Rate and Rhythm: Normal rate and regular rhythm.     Heart sounds: Normal heart sounds.  Pulmonary:     Effort: Pulmonary effort is normal.     Breath sounds: Normal breath sounds.  Skin:    General: Skin is warm and dry.  Neurological:     Mental Status: She is alert and oriented to person, place, and time.         Latest Ref Rng & Units 06/06/2023    8:26 PM  CMP  Glucose 70 - 99 mg/dL 98   BUN 8 - 23 mg/dL 27   Creatinine 1.19 - 1.00 mg/dL 1.47   Sodium 829 - 562 mmol/L 138   Potassium 3.5 - 5.1 mmol/L 3.5   Chloride 98 - 111 mmol/L 98   CO2 22 - 32 mmol/L 26   Calcium 8.9 - 10.3 mg/dL 9.7       Latest Ref Rng & Units 02/23/2024    2:46 PM  CBC  WBC 4.0 - 10.5 K/uL 7.0   Hemoglobin 12.0 - 15.0 g/dL 13.0   Hematocrit 86.5 -  46.0 % 37.6   Platelets 150 - 400 K/uL 145      Assessment and plan- Patient is a 68 y.o. female here for routine follow-up of neutropenia and iron deficiency anemia  Patient has baseline white cell count runs between 2-3.  Presently her white cell count is pseudo normal at 7 likely due to her recent hospitalization and acute inflammatory response.  Her platelets which typically run between 70-100 are also at 145 likely higher than her baseline also reactive.   Continue to monitor  Hemoglobin is surprisingly stable at 12.4 despite her hospitalization ferritin levels are high at 327.  Although iron saturation is low at 10%, I am holding off on IV iron at this time.  CBC ferritin and iron studies in 4 and 8 months and I will see her back in 8 months   Visit Diagnosis 1. Iron deficiency anemia due to chronic blood loss      Dr. Owens Shark, MD, MPH [facility] [phone] 02/24/2024 1:28 PM

## 2024-02-27 ENCOUNTER — Encounter: Payer: Self-pay | Admitting: Emergency Medicine

## 2024-02-27 ENCOUNTER — Inpatient Hospital Stay

## 2024-02-27 ENCOUNTER — Other Ambulatory Visit: Payer: Self-pay

## 2024-02-27 ENCOUNTER — Emergency Department

## 2024-02-27 ENCOUNTER — Inpatient Hospital Stay
Admission: EM | Admit: 2024-02-27 | Discharge: 2024-03-12 | DRG: 853 | Disposition: A | Discharge status: Home Health | Attending: Student | Admitting: Student

## 2024-02-27 DIAGNOSIS — Z95818 Presence of other cardiac implants and grafts: Secondary | ICD-10-CM

## 2024-02-27 DIAGNOSIS — Z8619 Personal history of other infectious and parasitic diseases: Secondary | ICD-10-CM

## 2024-02-27 DIAGNOSIS — D500 Iron deficiency anemia secondary to blood loss (chronic): Secondary | ICD-10-CM | POA: Diagnosis not present

## 2024-02-27 DIAGNOSIS — E1165 Type 2 diabetes mellitus with hyperglycemia: Secondary | ICD-10-CM | POA: Diagnosis not present

## 2024-02-27 DIAGNOSIS — Z7901 Long term (current) use of anticoagulants: Secondary | ICD-10-CM

## 2024-02-27 DIAGNOSIS — G4733 Obstructive sleep apnea (adult) (pediatric): Secondary | ICD-10-CM | POA: Diagnosis present

## 2024-02-27 DIAGNOSIS — A419 Sepsis, unspecified organism: Secondary | ICD-10-CM | POA: Diagnosis not present

## 2024-02-27 DIAGNOSIS — A0472 Enterocolitis due to Clostridium difficile, not specified as recurrent: Secondary | ICD-10-CM

## 2024-02-27 DIAGNOSIS — D62 Acute posthemorrhagic anemia: Secondary | ICD-10-CM | POA: Diagnosis not present

## 2024-02-27 DIAGNOSIS — R6521 Severe sepsis with septic shock: Secondary | ICD-10-CM | POA: Diagnosis present

## 2024-02-27 DIAGNOSIS — Z87891 Personal history of nicotine dependence: Secondary | ICD-10-CM

## 2024-02-27 DIAGNOSIS — K5733 Diverticulitis of large intestine without perforation or abscess with bleeding: Secondary | ICD-10-CM | POA: Diagnosis present

## 2024-02-27 DIAGNOSIS — I5022 Chronic systolic (congestive) heart failure: Secondary | ICD-10-CM | POA: Diagnosis not present

## 2024-02-27 DIAGNOSIS — E11649 Type 2 diabetes mellitus with hypoglycemia without coma: Secondary | ICD-10-CM | POA: Diagnosis not present

## 2024-02-27 DIAGNOSIS — E1169 Type 2 diabetes mellitus with other specified complication: Secondary | ICD-10-CM | POA: Diagnosis not present

## 2024-02-27 DIAGNOSIS — I480 Paroxysmal atrial fibrillation: Secondary | ICD-10-CM | POA: Diagnosis not present

## 2024-02-27 DIAGNOSIS — M159 Polyosteoarthritis, unspecified: Secondary | ICD-10-CM | POA: Diagnosis present

## 2024-02-27 DIAGNOSIS — K589 Irritable bowel syndrome without diarrhea: Secondary | ICD-10-CM | POA: Diagnosis present

## 2024-02-27 DIAGNOSIS — I081 Rheumatic disorders of both mitral and tricuspid valves: Secondary | ICD-10-CM | POA: Diagnosis present

## 2024-02-27 DIAGNOSIS — E8729 Other acidosis: Secondary | ICD-10-CM | POA: Diagnosis not present

## 2024-02-27 DIAGNOSIS — E1122 Type 2 diabetes mellitus with diabetic chronic kidney disease: Secondary | ICD-10-CM | POA: Diagnosis present

## 2024-02-27 DIAGNOSIS — I5043 Acute on chronic combined systolic (congestive) and diastolic (congestive) heart failure: Secondary | ICD-10-CM | POA: Diagnosis present

## 2024-02-27 DIAGNOSIS — I5020 Unspecified systolic (congestive) heart failure: Secondary | ICD-10-CM | POA: Diagnosis not present

## 2024-02-27 DIAGNOSIS — I2510 Atherosclerotic heart disease of native coronary artery without angina pectoris: Secondary | ICD-10-CM | POA: Diagnosis present

## 2024-02-27 DIAGNOSIS — K219 Gastro-esophageal reflux disease without esophagitis: Secondary | ICD-10-CM | POA: Diagnosis present

## 2024-02-27 DIAGNOSIS — M810 Age-related osteoporosis without current pathological fracture: Secondary | ICD-10-CM | POA: Diagnosis present

## 2024-02-27 DIAGNOSIS — Z8249 Family history of ischemic heart disease and other diseases of the circulatory system: Secondary | ICD-10-CM

## 2024-02-27 DIAGNOSIS — I5082 Biventricular heart failure: Secondary | ICD-10-CM | POA: Diagnosis present

## 2024-02-27 DIAGNOSIS — N1832 Chronic kidney disease, stage 3b: Secondary | ICD-10-CM | POA: Diagnosis present

## 2024-02-27 DIAGNOSIS — K649 Unspecified hemorrhoids: Secondary | ICD-10-CM | POA: Diagnosis present

## 2024-02-27 DIAGNOSIS — I4819 Other persistent atrial fibrillation: Secondary | ICD-10-CM | POA: Diagnosis present

## 2024-02-27 DIAGNOSIS — I2729 Other secondary pulmonary hypertension: Secondary | ICD-10-CM | POA: Diagnosis present

## 2024-02-27 DIAGNOSIS — Z7984 Long term (current) use of oral hypoglycemic drugs: Secondary | ICD-10-CM

## 2024-02-27 DIAGNOSIS — I25810 Atherosclerosis of coronary artery bypass graft(s) without angina pectoris: Secondary | ICD-10-CM | POA: Diagnosis present

## 2024-02-27 DIAGNOSIS — Z79890 Hormone replacement therapy (postmenopausal): Secondary | ICD-10-CM

## 2024-02-27 DIAGNOSIS — K5100 Ulcerative (chronic) pancolitis without complications: Secondary | ICD-10-CM | POA: Diagnosis not present

## 2024-02-27 DIAGNOSIS — E875 Hyperkalemia: Secondary | ICD-10-CM | POA: Diagnosis not present

## 2024-02-27 DIAGNOSIS — E039 Hypothyroidism, unspecified: Secondary | ICD-10-CM | POA: Diagnosis present

## 2024-02-27 DIAGNOSIS — Z5986 Financial insecurity: Secondary | ICD-10-CM

## 2024-02-27 DIAGNOSIS — N179 Acute kidney failure, unspecified: Principal | ICD-10-CM

## 2024-02-27 DIAGNOSIS — E874 Mixed disorder of acid-base balance: Secondary | ICD-10-CM | POA: Diagnosis present

## 2024-02-27 DIAGNOSIS — Z932 Ileostomy status: Secondary | ICD-10-CM | POA: Diagnosis not present

## 2024-02-27 DIAGNOSIS — N170 Acute kidney failure with tubular necrosis: Secondary | ICD-10-CM | POA: Diagnosis present

## 2024-02-27 DIAGNOSIS — D72823 Leukemoid reaction: Secondary | ICD-10-CM | POA: Diagnosis present

## 2024-02-27 DIAGNOSIS — Z9842 Cataract extraction status, left eye: Secondary | ICD-10-CM

## 2024-02-27 DIAGNOSIS — D61818 Other pancytopenia: Secondary | ICD-10-CM | POA: Diagnosis present

## 2024-02-27 DIAGNOSIS — E118 Type 2 diabetes mellitus with unspecified complications: Secondary | ICD-10-CM | POA: Diagnosis not present

## 2024-02-27 DIAGNOSIS — Z8673 Personal history of transient ischemic attack (TIA), and cerebral infarction without residual deficits: Secondary | ICD-10-CM

## 2024-02-27 DIAGNOSIS — A414 Sepsis due to anaerobes: Secondary | ICD-10-CM | POA: Diagnosis present

## 2024-02-27 DIAGNOSIS — I130 Hypertensive heart and chronic kidney disease with heart failure and stage 1 through stage 4 chronic kidney disease, or unspecified chronic kidney disease: Secondary | ICD-10-CM | POA: Diagnosis present

## 2024-02-27 DIAGNOSIS — Z79899 Other long term (current) drug therapy: Secondary | ICD-10-CM

## 2024-02-27 DIAGNOSIS — Z794 Long term (current) use of insulin: Secondary | ICD-10-CM | POA: Diagnosis not present

## 2024-02-27 DIAGNOSIS — E876 Hypokalemia: Secondary | ICD-10-CM | POA: Diagnosis present

## 2024-02-27 DIAGNOSIS — K5521 Angiodysplasia of colon with hemorrhage: Secondary | ICD-10-CM | POA: Diagnosis present

## 2024-02-27 DIAGNOSIS — A0471 Enterocolitis due to Clostridium difficile, recurrent: Secondary | ICD-10-CM | POA: Diagnosis present

## 2024-02-27 DIAGNOSIS — E878 Other disorders of electrolyte and fluid balance, not elsewhere classified: Secondary | ICD-10-CM | POA: Diagnosis present

## 2024-02-27 DIAGNOSIS — Z9841 Cataract extraction status, right eye: Secondary | ICD-10-CM

## 2024-02-27 DIAGNOSIS — I4811 Longstanding persistent atrial fibrillation: Secondary | ICD-10-CM | POA: Diagnosis not present

## 2024-02-27 DIAGNOSIS — R7401 Elevation of levels of liver transaminase levels: Secondary | ICD-10-CM | POA: Diagnosis not present

## 2024-02-27 DIAGNOSIS — Z7982 Long term (current) use of aspirin: Secondary | ICD-10-CM

## 2024-02-27 DIAGNOSIS — E639 Nutritional deficiency, unspecified: Secondary | ICD-10-CM | POA: Diagnosis present

## 2024-02-27 DIAGNOSIS — D509 Iron deficiency anemia, unspecified: Secondary | ICD-10-CM | POA: Diagnosis present

## 2024-02-27 DIAGNOSIS — Z96651 Presence of right artificial knee joint: Secondary | ICD-10-CM | POA: Diagnosis present

## 2024-02-27 DIAGNOSIS — Z961 Presence of intraocular lens: Secondary | ICD-10-CM | POA: Diagnosis present

## 2024-02-27 DIAGNOSIS — E785 Hyperlipidemia, unspecified: Secondary | ICD-10-CM | POA: Diagnosis present

## 2024-02-27 DIAGNOSIS — D7289 Other specified disorders of white blood cells: Secondary | ICD-10-CM | POA: Diagnosis not present

## 2024-02-27 DIAGNOSIS — Z886 Allergy status to analgesic agent status: Secondary | ICD-10-CM

## 2024-02-27 DIAGNOSIS — Z833 Family history of diabetes mellitus: Secondary | ICD-10-CM

## 2024-02-27 DIAGNOSIS — M47819 Spondylosis without myelopathy or radiculopathy, site unspecified: Secondary | ICD-10-CM | POA: Diagnosis present

## 2024-02-27 DIAGNOSIS — I255 Ischemic cardiomyopathy: Secondary | ICD-10-CM | POA: Diagnosis present

## 2024-02-27 DIAGNOSIS — M4800 Spinal stenosis, site unspecified: Secondary | ICD-10-CM | POA: Diagnosis present

## 2024-02-27 DIAGNOSIS — M109 Gout, unspecified: Secondary | ICD-10-CM | POA: Diagnosis present

## 2024-02-27 DIAGNOSIS — E871 Hypo-osmolality and hyponatremia: Secondary | ICD-10-CM | POA: Diagnosis present

## 2024-02-27 DIAGNOSIS — E119 Type 2 diabetes mellitus without complications: Secondary | ICD-10-CM | POA: Diagnosis not present

## 2024-02-27 DIAGNOSIS — Z803 Family history of malignant neoplasm of breast: Secondary | ICD-10-CM

## 2024-02-27 DIAGNOSIS — E8721 Acute metabolic acidosis: Secondary | ICD-10-CM | POA: Diagnosis not present

## 2024-02-27 LAB — C DIFFICILE QUICK SCREEN W PCR REFLEX
C Diff antigen: POSITIVE — AB
C Diff interpretation: DETECTED
C Diff toxin: POSITIVE — AB

## 2024-02-27 LAB — CBC WITH DIFFERENTIAL/PLATELET
Abs Immature Granulocytes: 0.95 10*3/uL — ABNORMAL HIGH (ref 0.00–0.07)
Basophils Absolute: 0 10*3/uL (ref 0.0–0.1)
Basophils Relative: 0 %
Eosinophils Absolute: 0 10*3/uL (ref 0.0–0.5)
Eosinophils Relative: 0 %
HCT: 39.2 % (ref 36.0–46.0)
Hemoglobin: 12.6 g/dL (ref 12.0–15.0)
Immature Granulocytes: 3 %
Lymphocytes Relative: 2 %
Lymphs Abs: 0.7 10*3/uL (ref 0.7–4.0)
MCH: 31 pg (ref 26.0–34.0)
MCHC: 32.1 g/dL (ref 30.0–36.0)
MCV: 96.3 fL (ref 80.0–100.0)
Monocytes Absolute: 1.5 10*3/uL — ABNORMAL HIGH (ref 0.1–1.0)
Monocytes Relative: 4 %
Neutro Abs: 33.3 10*3/uL — ABNORMAL HIGH (ref 1.7–7.7)
Neutrophils Relative %: 91 %
Platelets: 279 10*3/uL (ref 150–400)
RBC: 4.07 MIL/uL (ref 3.87–5.11)
RDW: 17.4 % — ABNORMAL HIGH (ref 11.5–15.5)
WBC: 36.5 10*3/uL — ABNORMAL HIGH (ref 4.0–10.5)
nRBC: 0 % (ref 0.0–0.2)

## 2024-02-27 LAB — COMPREHENSIVE METABOLIC PANEL WITH GFR
ALT: 19 U/L (ref 0–44)
AST: 40 U/L (ref 15–41)
Albumin: 3.1 g/dL — ABNORMAL LOW (ref 3.5–5.0)
Alkaline Phosphatase: 139 U/L — ABNORMAL HIGH (ref 38–126)
Anion gap: 31 — ABNORMAL HIGH (ref 5–15)
BUN: 94 mg/dL — ABNORMAL HIGH (ref 8–23)
CO2: 7 mmol/L — ABNORMAL LOW (ref 22–32)
Calcium: 7.5 mg/dL — ABNORMAL LOW (ref 8.9–10.3)
Chloride: 89 mmol/L — ABNORMAL LOW (ref 98–111)
Creatinine, Ser: 7.04 mg/dL — ABNORMAL HIGH (ref 0.44–1.00)
GFR, Estimated: 6 mL/min — ABNORMAL LOW (ref >60)
Glucose, Bld: 75 mg/dL (ref 70–99)
Potassium: 4.5 mmol/L (ref 3.5–5.1)
Sodium: 127 mmol/L — ABNORMAL LOW (ref 135–145)
Total Bilirubin: 0.9 mg/dL (ref 0.0–1.2)
Total Protein: 6.6 g/dL (ref 6.5–8.1)

## 2024-02-27 LAB — LIPASE, BLOOD: Lipase: 25 U/L (ref 11–51)

## 2024-02-27 LAB — POC OCCULT BLOOD, ED: Fecal Occult Blood: POSITIVE

## 2024-02-27 LAB — LACTIC ACID, PLASMA: Lactic Acid, Venous: 8.3 mmol/L (ref 0.5–1.9)

## 2024-02-27 LAB — OSMOLALITY, URINE: Osmolality, Ur: 307 mosm/kg (ref 300–900)

## 2024-02-27 LAB — SODIUM, URINE, RANDOM: Sodium, Ur: 21 mmol/L

## 2024-02-27 LAB — CREATININE, URINE, RANDOM: Creatinine, Urine: 98 mg/dL

## 2024-02-27 LAB — MRSA NEXT GEN BY PCR, NASAL: MRSA by PCR Next Gen: NOT DETECTED

## 2024-02-27 MED ORDER — ONDANSETRON HCL 4 MG/2ML IJ SOLN: 4.0000 mg | Freq: Four times a day (QID) | INTRAMUSCULAR | Status: DC | PRN

## 2024-02-27 MED ORDER — VASOPRESSIN 20 UNITS/100 ML INFUSION FOR SHOCK
0.0000 [IU]/min | INTRAVENOUS | Status: DC
  Administered 2024-02-27 – 2024-02-29 (×6): 0.04 [IU]/min via INTRAVENOUS
  Filled 2024-02-27 (×8): qty 100

## 2024-02-27 MED ORDER — STERILE WATER FOR INJECTION IV SOLN
INTRAVENOUS | Status: DC
  Filled 2024-02-27: qty 150
  Filled 2024-02-27: qty 1000
  Filled 2024-02-27: qty 150
  Filled 2024-02-27 (×2): qty 1000
  Filled 2024-02-27 (×2): qty 150

## 2024-02-27 MED ORDER — VANCOMYCIN HCL IN DEXTROSE 1-5 GM/200ML-% IV SOLN
1000.0000 mg | Freq: Once | INTRAVENOUS | Status: AC
  Administered 2024-02-27: 1000 mg via INTRAVENOUS
  Filled 2024-02-27: qty 200

## 2024-02-27 MED ORDER — SODIUM CHLORIDE 0.9 % IV SOLN
2.0000 g | Freq: Once | INTRAVENOUS | Status: AC
  Administered 2024-02-27: 2 g via INTRAVENOUS
  Filled 2024-02-27: qty 12.5

## 2024-02-27 MED ORDER — ALBUTEROL SULFATE (2.5 MG/3ML) 0.083% IN NEBU: 2.5000 mg | INHALATION_SOLUTION | RESPIRATORY_TRACT | Status: DC | PRN

## 2024-02-27 MED ORDER — SODIUM CHLORIDE 0.9 % IV BOLUS (SEPSIS)
250.0000 mL | Freq: Once | INTRAVENOUS | Status: AC
  Administered 2024-02-27: 250 mL via INTRAVENOUS

## 2024-02-27 MED ORDER — DOCUSATE SODIUM 100 MG PO CAPS: 100.0000 mg | ORAL_CAPSULE | Freq: Two times a day (BID) | ORAL | Status: DC | PRN

## 2024-02-27 MED ORDER — SODIUM CHLORIDE 0.9 % IV BOLUS
1000.0000 mL | Freq: Once | INTRAVENOUS | Status: AC
  Administered 2024-02-27: 1000 mL via INTRAVENOUS

## 2024-02-27 MED ORDER — NOREPINEPHRINE 4 MG/250ML-% IV SOLN
0.0000 ug/min | INTRAVENOUS | Status: DC
  Administered 2024-02-28: 17 ug/min via INTRAVENOUS
  Filled 2024-02-27 (×2): qty 250

## 2024-02-27 MED ORDER — POLYETHYLENE GLYCOL 3350 17 G PO PACK: 17.0000 g | PACK | Freq: Every day | ORAL | Status: DC | PRN

## 2024-02-27 MED ORDER — LATANOPROST 0.005 % OP SOLN
1.0000 [drp] | Freq: Every day | OPHTHALMIC | Status: DC
  Administered 2024-02-27 – 2024-03-11 (×12): 1 [drp] via OPHTHALMIC
  Filled 2024-02-27 (×2): qty 2.5

## 2024-02-27 MED ORDER — PANTOPRAZOLE SODIUM 40 MG IV SOLR
40.0000 mg | Freq: Two times a day (BID) | INTRAVENOUS | Status: DC
  Administered 2024-02-27 – 2024-02-28 (×3): 40 mg via INTRAVENOUS
  Filled 2024-02-27 (×3): qty 10

## 2024-02-27 MED ORDER — METRONIDAZOLE 500 MG/100ML IV SOLN
500.0000 mg | Freq: Once | INTRAVENOUS | Status: AC
  Administered 2024-02-27: 500 mg via INTRAVENOUS
  Filled 2024-02-27: qty 100

## 2024-02-27 MED ORDER — SODIUM CHLORIDE 0.9 % IV BOLUS (SEPSIS)
1000.0000 mL | Freq: Once | INTRAVENOUS | Status: AC
  Administered 2024-02-27: 1000 mL via INTRAVENOUS

## 2024-02-27 MED ORDER — VANCOMYCIN HCL 125 MG PO CAPS
500.0000 mg | ORAL_CAPSULE | Freq: Four times a day (QID) | ORAL | Status: DC
  Filled 2024-02-27 (×2): qty 2

## 2024-02-27 MED ORDER — NOREPINEPHRINE 4 MG/250ML-% IV SOLN
INTRAVENOUS | Status: AC
  Administered 2024-02-27: 2 ug/min via INTRAVENOUS
  Filled 2024-02-27: qty 250

## 2024-02-27 MED ORDER — METRONIDAZOLE 500 MG/100ML IV SOLN
500.0000 mg | Freq: Three times a day (TID) | INTRAVENOUS | Status: DC
  Administered 2024-02-28 – 2024-03-02 (×11): 500 mg via INTRAVENOUS
  Filled 2024-02-27 (×12): qty 100

## 2024-02-27 MED ORDER — LEVOTHYROXINE SODIUM 50 MCG PO TABS
50.0000 ug | ORAL_TABLET | Freq: Every day | ORAL | Status: DC
Start: 2024-02-28 — End: 2024-02-29
  Administered 2024-02-28: 50 ug via ORAL
  Filled 2024-02-27: qty 1

## 2024-02-27 MED ORDER — SODIUM CHLORIDE 0.9 % IV BOLUS (SEPSIS)
500.0000 mL | Freq: Once | INTRAVENOUS | Status: AC
  Administered 2024-02-27: 500 mL via INTRAVENOUS

## 2024-02-27 NOTE — ED Provider Triage Note (Signed)
 Emergency Medicine Provider Triage Evaluation Note  Tammy Arias , a 68 y.o. female  was evaluated in triage.  Pt complains of vomiting and diarrhea. Family concerned about possible C Diff.   Review of Systems  Positive: Vomiting, diarrhea Negative:   Physical Exam  There were no vitals taken for this visit. Gen:   Awake, no distress   Resp:  Normal effort  MSK:   Moves extremities without difficulty  Other:    Medical Decision Making  Medically screening exam initiated at 6:23 PM.  Appropriate orders placed.  Tammy Arias was informed that the remainder of the evaluation will be completed by another provider, this initial triage assessment does not replace that evaluation, and the importance of remaining in the ED until their evaluation is complete.     Cameron Ali, PA-C 02/27/24 1824

## 2024-02-27 NOTE — ED Notes (Signed)
 Patient transported to CT with this RN, with cardiac monitoring, and remains on Levo

## 2024-02-27 NOTE — H&P (Signed)
 NAME:  Tammy Arias, MRN:  [id number], DOB:  06/06/1956, LOS: 0 ADMISSION DATE:  02/27/2024, CONSULTATION DATE:  02/27/24 REFERRING MD:  Dr. Marisa Severin, CHIEF COMPLAINT:  diarrhea/ vomiting   History of Present Illness:  68 yo F presenting to [facility] ED from home for evaluation of diarrhea and vomiting since 02/25/24.  History obtained per chart review and patient bedside report. Patient has had multiple hospitalizations since January 2025 with GI bleeding s/p clipping, watchman device placement and most recently acute CHF exacerbation from 01/26/24- 02/13/24.  During her most recent admission, she required loop diuretics as well as inotrope assistance for adequate diuresis. She was changed from entresto to valsartan due to hypotension and her BB was stopped. She was continued on Eliquis post watchman device per EP recommendations after 6 week TEE on 01/23/24 noted a small flow jet from the LAA back into the LA proper at the edge of the inferior aspect of the occluder device.  She also tested positive for C.Diff completing a 10 day course of PO vancomycin for treatment on 02/10/24.  After discharge she was in her normal state of health until noticing recurrent diarrhea on 02/17/24, which she thought might just be an IBS flare. Diarrhea however worsened on 02/21/24 which got much worse on 3/28 with the addition of persistent non-bloody bilious emesis. Her best friend, who has been helping care for her stated she noticed some blood in her stool that seemed to be from the patient's hemorrhoids. However this bleeding significantly increased on 02/27/24 and the patient has had at least 2 bloody bowel movements. The patient also endorses generalized weakness and fatigue, diffuse abdominal tenderness, blurred vision with green spots. She denies chest pain, dyspnea, urinary symptoms, fever/chills, LOC or palpitations. She did fall about 3 days ago, landing on her buttocks, she denies hitting her head.  She reports  continuing to take all her prescribed medications including her valsartan, torsemide and Eliquis.  ED course: Upon arrival patient significantly hypotensive and drowsy. Sepsis protocol initiated and due to sever hypotension, central venous catheter placed and vasopressor started. Labs significant for hyponatremia, hypochloremia, AGMA, AKI on CKD, elevated Alk Phos, leukocytosis and significant lactic acidosis with fecal occult positive. Small amount of hematochezia noted in ED. Imaging significant for pan colitis without signs of toxic megacolon and cholelithiasis without cholecystis. Medications given: cefepime/vancomycin/flagyl, levophed drip, 2.75 L IVF bolus Initial Vitals: 97.4, 15, 90, 62/35 & 100% on RA Significant labs: (Labs/ Imaging personally reviewed) I, Cheryll Cockayne Rust-Chester, AGACNP-BC, personally viewed and interpreted this ECG. EKG Interpretation: Date: 02/27/24, EKG Time: 18:41, Rate: 86, Rhythm: NSR, QRS Axis: possible LAD, Intervals: 1st degree HB, prolonged Qtc, ST/T Wave abnormalities: non specific T wave abnormalities, Narrative Interpretation: NSR with 1st degree and prolonged Qtc Chemistry: Na+: 127, Cl: 89, K+: 4.5, BUN/Cr.: 94/ 7.04, Serum CO2/ AG: 7/ 31, Alk phos: 139 Hematology: WBC: 36.5, Hgb: 12.6,  Lactic: 8.3  GI panel: pending C.Diff testing: Positive  CXR 02/27/24: no acute cardiopulmonary disease CT abdomen/pelvis wo contrast 02/27/24: Diffuse colonic wall thickening and surrounding inflammation compatible with pancolitis. Cholelithiasis.  PCCM consulted for admission due to severe sepsis with shock secondary to recurrent C.Diff colitis requiring vasopressor support.  Pertinent  Medical History  CAD s/p CABG & ASD repair (2005) HFrEF (LVEF 30%) ICM Pulmonary HTN Heart Murmur Atrial Fibrillation s/p Watchman device (11/2023) Hypothyroidism HLD CKD stage 3b (baseline Cr 1.0) HTN Iron deficiency Anemia Angiodysplasia of the intestinal  tract Diverticulosis GI bleeding due to AVM  s/p clipping (11/2023) OSA on CPAP T2DM TIA Gout Former Smoker (20+ pack history) Severe MR/TR Pancytopenia Significant Hospital Events: Including procedures, antibiotic start and stop dates in addition to other pertinent events   02/27/24: Admit to ICU with severe sepsis and shock secondary to recurrent C.Diff colitis requiring vasopressor support.  Interim History / Subjective:  Patient alert and responsive, reporting complete resolution of lethargy and blurred vision.  Plan of care discussed, all questions and concerns answered at this time.  Objective   Blood pressure (!) 77/43, pulse 93, temperature (!) 97.4 F (36.3 C), temperature source Oral, resp. rate 15, height 5\' 1"  (1.549 m), weight 55.9 kg, SpO2 95%.        Intake/Output Summary (Last 24 hours) at 02/27/2024 2059 Last data filed at 02/27/2024 2057 Gross per 24 hour  Intake 2200 ml  Output --  Net 2200 ml   Filed Weights   02/27/24 1830  Weight: 55.9 kg    Examination: General: Adult female, critically ill, lying in bed, moaning HEENT: MM pink/dry, anicteric, atraumatic, neck supple Neuro: A&O x 4, able to follow commands, PERRL +3, MAE CV: s1s2 irregular, controlled A-fib on monitor, no r/m/g Pulm: Regular, non labored on RA , breath sounds clear-BUL & diminished-BLL GI: soft, rounded, tender, bs x 4 Skin: limited exam- no rashes/lesions noted Extremities: warm/dry, pulses + 2 R/P, no edema noted  Resolved Hospital Problem list     Assessment & Plan:  #Severe Sepsis with shock suspected secondary to recurrent C.Diff pan colitis Initial interventions/workup included: 2.75 L of NS/LR & Cefepime/ Vancomycin/ Metronidazole Discussed case with Pharmacist who recommended, even though recurrence, due to severe septic shock will start back vancomycin PO and Flagyl - Supplemental oxygen as needed, to maintain SpO2 > 90% - f/u cultures, trend lactic/ PCT - Daily CBC,  monitor WBC/ fever curve - IV antibiotics: Flagyl & vancomycin PO - conservative further IVF hydration due to HFrEF - Continue vasopressors to maintain MAP< 65: norepinephrine, add vasopressin - Persistent hypotension consider stress dose steroids - Infectious Disease consulted, appreciate input  #Acute Kidney Injury superimposed on CKD stage 3b suspect secondary to hypovolemia/dehydration in setting of severe nausea/vomiting #AGMA #Hyponatremia suspect hypotonic in the setting of dehydration Baseline Cr: 1.0, Cr on admission: 7.04 - Strict I/O's: alert provider if UOP < 0.5 mL/kg/hr - gentle IVF hydration >> start sodium bicarbonate infusion - follow- up CK, VBG, BMP at midnight. Daily BMP, replace electrolytes PRN - Avoid nephrotoxic agents as able, ensure adequate renal perfusion - Consider nephrology consultation depending on Cr trend/ if iHD or CRRT indicated  - obtain urine lytes, serum osmolality - consider renal US PRN  #Suspected Acute Lower Gastrointestinal Bleed in the setting of chronic anticoagulation #Chronic IBS PMHx: Iron deficiency anemia, GIB s/t AVM s/p clipping - hold Eliquis - initiate Protonix IV BID - NPO - Monitor for s/s of bleeding - F/u PT/INR & CBC at midnight. Daily CBC, with H&H Q 6, PT/ INR monitoring PRN - Transfuse for Hgb <8 (due to chronic cardiac conditions) - GI consulted, appreciate input  #Chronic HFrEF without exacerbation #PAF on Eliquis s/p Watchman device PMHx: HFrEF, CAD, Severe MR/TR, HTN, HLD, ICM - f/u BNP - Continuous cardiac monitoring  - Daily weights to assess volume status - Hold outpatient regimen due to renal function and hypotension: valsartan, torsemide, spironlactone - hold Eliquis due to hematochezia  #OSA on CPAP - supplemental O2, PRN to maintain SpO2 > 90% - continue outpatient CPAP regimen - bronchodilators  PRN  #Type 2 Diabetes Mellitus - Monitor CBG Q 4 hours - consider SSI dosing if CBG > 180 - target  range while in ICU: 140-180 - follow ICU hyper/hypo-glycemia protocol - hold jardiance and metformin  #Hypothyroidism - f/u TSH, T4, T3 - continue outpatient Synthroid regimen  #Mild Transaminitis  - Trend hepatic function - avoid hepatotoxic agents  #Blurred Vision - resolved PMHx: TIA Suspect due to severe dehydration and shock. Due to resolution will hold off on acute imaging overnight.  - Consider CTH vs MRI brain if any recurrence of symptoms Best Practice (right click and "Reselect all SmartList Selections" daily)  Diet/type: NPO w/ oral meds DVT prophylaxis SCD Pressure ulcer(s): N/A GI prophylaxis: PPI Lines: Central line and yes and it is still needed Foley:  N/A Code Status:  full code Last date of multidisciplinary goals of care discussion [02/27/24]  Labs   CBC: Recent Labs  Lab 02/23/24 1446 02/27/24 1901  WBC 7.0 36.5*  NEUTROABS 5.7 33.3*  HGB 12.4 12.6  HCT 37.6 39.2  MCV 95.7 96.3  PLT 145* 279    Basic Metabolic Panel: Recent Labs  Lab 02/27/24 1901  NA 127*  K 4.5  CL 89*  CO2 7*  GLUCOSE 75  BUN 94*  CREATININE 7.04*  CALCIUM 7.5*   GFR: Estimated Creatinine Clearance: 5.7 mL/min (A) (by C-G formula based on SCr of 7.04 mg/dL (H)). Recent Labs  Lab 02/23/24 1446 02/27/24 1901 02/27/24 1919  WBC 7.0 36.5*  --   LATICACIDVEN  --   --  8.3*    Liver Function Tests: Recent Labs  Lab 02/27/24 1901  AST 40  ALT 19  ALKPHOS 139*  BILITOT 0.9  PROT 6.6  ALBUMIN 3.1*   Recent Labs  Lab 02/27/24 1901  LIPASE 25   No results for input(s): "AMMONIA" in the last 168 hours.  ABG No results found for: "PHART", "PCO2ART", "PO2ART", "HCO3", "TCO2", "ACIDBASEDEF", "O2SAT"   Coagulation Profile: No results for input(s): "INR", "PROTIME" in the last 168 hours.  Cardiac Enzymes: No results for input(s): "CKTOTAL", "CKMB", "CKMBINDEX", "TROPONINI" in the last 168 hours.  HbA1C: Hgb A1c MFr Bld  Date/Time Value Ref Range Status   06/07/2023 02:58 AM 6.3 (H) 4.8 - 5.6 % Final    Comment:    (NOTE)         Prediabetes: 5.7 - 6.4         Diabetes: >6.4         Glycemic control for adults with diabetes: <7.0     CBG: No results for input(s): "GLUCAP" in the last 168 hours.  Review of Systems: positives in BOLD  Gen: Denies fever, chills, weight change, fatigue, night sweats HEENT: Denies blurred vision, double vision, hearing loss, tinnitus, sinus congestion, rhinorrhea, sore throat, neck stiffness, dysphagia PULM: Denies shortness of breath, cough, sputum production, hemoptysis, wheezing CV: Denies chest pain, edema, orthopnea, paroxysmal nocturnal dyspnea, palpitations GI: Denies abdominal pain, nausea, vomiting, diarrhea, hematochezia, melena, constipation, change in bowel habits GU: Denies dysuria, hematuria, polyuria, oliguria, urethral discharge Endocrine: Denies hot or cold intolerance, polyuria, polyphagia or appetite change Derm: Denies rash, dry skin, scaling or peeling skin change Heme: Denies easy bruising, bleeding, bleeding gums Neuro: Denies headache, numbness, weakness, slurred speech, loss of memory or consciousness  Past Medical History:  She,  has a past medical history of AC (acromioclavicular) joint bone spurs, right, Anemia, Arthritis, Atrial fibrillation (HCC), Cardiac murmur, CHF (congestive heart failure) (HCC), Chronic anticoagulation, Cor triatriatum,  Coronary artery disease, Cortical cataract, DOE (dyspnea on exertion), DOE (dyspnea on exertion), GERD (gastroesophageal reflux disease), HFrEF (heart failure with reduced ejection fraction) (HCC), History of shingles (2004), Hyperlipidemia, Hypertension, Hypothyroidism, IBS (irritable bowel syndrome), Ischemic cardiomyopathy, Lumbar scoliosis, Lumbar spinal stenosis, Migraines, Osteoporosis, Pulmonary HTN (HCC), S/P CABG x 3 (01/28/2004), Sleep apnea, T2DM (type 2 diabetes mellitus) (HCC), TIA (transient ischemic attack) (05/29/2016), and  Vitamin B 12 deficiency.   Surgical History:   Past Surgical History:  Procedure Laterality Date   CARDIAC CATHETERIZATION     CATARACT EXTRACTION W/ INTRAOCULAR LENS IMPLANT Bilateral    Cataract Extraction with IOL   COLONOSCOPY N/A 12/19/2018   Procedure: COLONOSCOPY;  Surgeon: Pasty Spillers, MD;  Location: [facility] ENDOSCOPY;  Service: Endoscopy;  Laterality: N/A;   COLONOSCOPY WITH PROPOFOL N/A 06/17/2020   Procedure: COLONOSCOPY WITH PROPOFOL;  Surgeon: Pasty Spillers, MD;  Location: [facility] ENDOSCOPY;  Service: Endoscopy;  Laterality: N/A;   COLONOSCOPY WITH PROPOFOL N/A 03/30/2023   Procedure: COLONOSCOPY WITH PROPOFOL;  Surgeon: Jaynie Collins, DO;  Location: [facility] ENDOSCOPY;  Service: Endoscopy;  Laterality: N/A;   COR TRIATRIATUM REPAIR N/A 01/28/2004   CORONARY ARTERY BYPASS GRAFT N/A 01/28/2004   3v CABG   ESOPHAGOGASTRODUODENOSCOPY N/A 12/19/2018   Procedure: ESOPHAGOGASTRODUODENOSCOPY (EGD);  Surgeon: Pasty Spillers, MD;  Location: [facility] ENDOSCOPY;  Service: Endoscopy;  Laterality: N/A;   ESOPHAGOGASTRODUODENOSCOPY (EGD) WITH PROPOFOL N/A 06/17/2020   Procedure: ESOPHAGOGASTRODUODENOSCOPY (EGD) WITH PROPOFOL;  Surgeon: Pasty Spillers, MD;  Location: [facility] ENDOSCOPY;  Service: Endoscopy;  Laterality: N/A;   ESOPHAGOGASTRODUODENOSCOPY (EGD) WITH PROPOFOL N/A 03/30/2023   Procedure: ESOPHAGOGASTRODUODENOSCOPY (EGD) WITH PROPOFOL;  Surgeon: Jaynie Collins, DO;  Location: [facility] ENDOSCOPY;  Service: Endoscopy;  Laterality: N/A;   RIGHT/LEFT HEART CATH AND CORONARY ANGIOGRAPHY Bilateral 03/29/2017   Procedure: Right/Left Heart Cath and Coronary Angiography;  Surgeon: Dalia Heading, MD;  Location: [facility] INVASIVE CV LAB;  Service: Cardiovascular;  Laterality: Bilateral;   TOTAL KNEE ARTHROPLASTY Right 04/05/2022   Procedure: TOTAL KNEE ARTHROPLASTY;  Surgeon: Kennedy Bucker, MD;  Location: [facility] ORS;  Service: Orthopedics;  Laterality: Right;   TUBAL LIGATION      VENTRAL HERNIA REPAIR N/A 04/21/2017   Procedure: HERNIA REPAIR VENTRAL ADULT;  Surgeon: Nadeen Landau, MD;  Location: [facility] ORS;  Service: General;  Laterality: N/A;     Social History:   reports that she quit smoking about 24 years ago. Her smoking use included cigarettes. She has never used smokeless tobacco. She reports that she does not drink alcohol and does not use drugs.   Family History:  Her family history includes Breast cancer in her cousin; Cancer in her sister and sister; Diabetes in her father; Valvular heart disease in her mother.   Allergies Allergies  Allergen Reactions   Vioxx [Rofecoxib] Swelling     Home Medications  Prior to Admission medications   Medication Sig Start Date End Date Taking? Authorizing Provider  albuterol (PROVENTIL HFA;VENTOLIN HFA) 108 (90 Base) MCG/ACT inhaler Inhale 2 puffs into the lungs every 6 (six) hours as needed for wheezing or shortness of breath.    [provider]  aspirin EC 81 MG tablet Take 81 mg by mouth daily. Patient not taking: Reported on 07/21/2023    [provider]  carvedilol (COREG) 6.25 MG tablet Take 6.25 mg by mouth 2 (two) times daily with a meal. Patient not taking: Reported on 02/23/2024 01/06/23   [provider]  ELIQUIS 5 MG TABS tablet Take 5  mg by mouth 2 (two) times daily.    [provider]  fluticasone (FLONASE) 50 MCG/ACT nasal spray Place 1 spray into both nostrils at bedtime. 02/05/19   [provider]  furosemide (LASIX) 40 MG tablet Take 40 mg by mouth daily. Pt stated she take 40 mg once per day Patient not taking: Reported on 02/23/2024    [provider]  JARDIANCE 10 MG TABS tablet Take 10 mg by mouth daily.    [provider]  latanoprost (XALATAN) 0.005 % ophthalmic solution Place 1 drop into both eyes at bedtime.    [provider]  levothyroxine (SYNTHROID) 50 MCG tablet Take 50 mcg by mouth daily before breakfast.     [provider]  lovastatin (MEVACOR) 10 MG tablet Take 10 mg by mouth at bedtime. Patient not taking: Reported on 02/23/2024    [provider]  metFORMIN (GLUCOPHAGE) 1000 MG tablet Take 1,000 mg by mouth 2 (two) times daily with a meal.    [provider]  ONETOUCH ULTRA test strip  10/15/22   [provider]  pantoprazole (PROTONIX) 40 MG tablet Take 40 mg by mouth daily. 01/28/21   [provider]  polyethylene glycol-electrolytes (NULYTELY) 420 g solution Take by mouth. Patient not taking: Reported on 02/23/2024 06/30/23   [provider]  rosuvastatin (CRESTOR) 5 MG tablet Take by mouth. 02/20/24 02/19/25  [provider]  sacubitril-valsartan (ENTRESTO) 49-51 MG Take 1 tablet by mouth 2 (two) times daily. Patient not taking: Reported on 02/23/2024    [provider]  spironolactone (ALDACTONE) 25 MG tablet Take 25 mg by mouth daily.    [provider]  torsemide (DEMADEX) 20 MG tablet Take by mouth. 02/20/24 02/19/25  [provider]  valsartan (DIOVAN) 40 MG tablet Take by mouth. 02/20/24 02/19/25  [provider]  vitamin B-12 (CYANOCOBALAMIN) 1000 MCG tablet Take 1,000 mcg by mouth daily.    [provider]     Critical care time: 70 minutes      Betsey Holiday, AGACNP-BC Acute Care Nurse Practitioner [facility]   [phone] / [phone] Please see Amion for details.

## 2024-02-27 NOTE — ED Triage Notes (Signed)
 Patient to ED via POV fro diarrhea/vomiting since Saturday. Recently admitted at [facility] for 14 days with c diff. Elevated WBC at PCP this AM.

## 2024-02-27 NOTE — ED Notes (Signed)
 Dionne Bucy, MD has explained procedure, risk, and benefits for central line placement, pt gave verbal consent and consent signed in pt's EMR

## 2024-02-27 NOTE — ED Notes (Signed)
 Pt return from CT scanner, GCS 15, A&O x4, remains on cardiac monitoring and levo.

## 2024-02-27 NOTE — ED Provider Notes (Signed)
 [facility] Provider Note    Event Date/Time   First MD Initiated Contact with Patient 02/27/24 1830     (approximate)   History   Diarrhea   HPI  Tammy Arias is a 68 y.o. female with a history of hypertension, diabetes, paroxysmal atrial fibrillation on Eliquis, diastolic CHF, CAD, ischemic cardiomyopathy who presents with diarrhea and generalized weakness.  The patient states that she was recently hospitalized at [facility] with C. difficile.  Previously she had been hospitalized for CHF exacerbation.  She has had increased diarrhea, vomiting, and generalized weakness for the last 3 to 4 days.  She denies any acute abdominal pain.  I reviewed the past medical records.  The patient was most was admitted to the hospitalist service in July 2024 with acute onset of GI bleed.  She was admitted to [facility] in February with acute on chronic CHF she was last seen by Dr. Smith Robert from hematology on 3/28 for follow-up of iron deficiency anemia.  At that time her hemoglobin was stable at 12.4.   Physical Exam   Triage Vital Signs: ED Triage Vitals  Encounter Vitals Group     BP 02/27/24 1829 (!) 58/35     Systolic BP Percentile --      Diastolic BP Percentile --      Pulse Rate 02/27/24 1829 89     Resp 02/27/24 1829 17     Temp 02/27/24 1833 (!) 97.4 F (36.3 C)     Temp Source 02/27/24 1833 Oral     SpO2 02/27/24 1829 97 %     Weight 02/27/24 1830 123 lb 3.2 oz (55.9 kg)     Height 02/27/24 1830 5\' 1"  (1.549 m)     Head Circumference --      Peak Flow --      Pain Score 02/27/24 1829 5     Pain Loc --      Pain Education --      Exclude from Growth Chart --     Most recent vital signs: Vitals:   02/27/24 2302 02/27/24 2315  BP:  (!) 105/54  Pulse: (!) 116 (!) 115  Resp: 15 (!) 21  Temp:    SpO2: 98% 99%     General: Alert, weak appearing, no distress.  CV:  Good peripheral perfusion.  Resp:  Normal effort.  Lungs CTAB. Abd:  Soft and nontender.  No  distention.  Other:  No jaundice or scleral icterus.  Dry mucous membranes.   ED Results / Procedures / Treatments   Labs (all labs ordered are listed, but only abnormal results are displayed) Labs Reviewed  C DIFFICILE QUICK SCREEN W PCR REFLEX   - Abnormal; Notable for the following components:      Result Value   C Diff antigen POSITIVE (*)    C Diff toxin POSITIVE (*)    All other components within normal limits  COMPREHENSIVE METABOLIC PANEL WITH GFR - Abnormal; Notable for the following components:   Sodium 127 (*)    Chloride 89 (*)    CO2 7 (*)    BUN 94 (*)    Creatinine, Ser 7.04 (*)    Calcium 7.5 (*)    Albumin 3.1 (*)    Alkaline Phosphatase 139 (*)    GFR, Estimated 6 (*)    Anion gap 31 (*)    All other components within normal limits  CBC WITH DIFFERENTIAL/PLATELET - Abnormal; Notable for the following components:  WBC 36.5 (*)    RDW 17.4 (*)    Neutro Abs 33.3 (*)    Monocytes Absolute 1.5 (*)    Abs Immature Granulocytes 0.95 (*)    All other components within normal limits  LACTIC ACID, PLASMA - Abnormal; Notable for the following components:   Lactic Acid, Venous 8.3 (*)    All other components within normal limits  GASTROINTESTINAL PANEL BY PCR, STOOL (REPLACES STOOL CULTURE)  MRSA NEXT GEN BY PCR, NASAL  CULTURE, BLOOD (ROUTINE X 2)  CULTURE, BLOOD (ROUTINE X 2)  LIPASE, BLOOD  OSMOLALITY, URINE  SODIUM, URINE, RANDOM  CREATININE, URINE, RANDOM  LACTIC ACID, PLASMA  CBC  MAGNESIUM  PHOSPHORUS  BASIC METABOLIC PANEL WITH GFR  BLOOD GAS, VENOUS  BASIC METABOLIC PANEL WITH GFR  MAGNESIUM  PHOSPHORUS  HEPATIC FUNCTION PANEL  TSH  T3, FREE  T4, FREE  CK  PROCALCITONIN  PROCALCITONIN  PROTIME-INR  BRAIN NATRIURETIC PEPTIDE  OSMOLALITY  CBC  URINALYSIS, COMPLETE (UACMP) WITH MICROSCOPIC  POC OCCULT BLOOD, ED  TYPE AND SCREEN  TROPONIN I (HIGH SENSITIVITY)  TROPONIN I (HIGH SENSITIVITY)     EKG  ED ECG REPORT I, Dionne Bucy, the attending physician, personally viewed and interpreted this ECG.  Date: 02/27/2024 EKG Time: 1841 Rate: 86 Rhythm: Atrial flutter QRS Axis: normal Intervals: IVCD, prolonged QTc ST/T Wave abnormalities: Nonspecific ST abnormalities Narrative Interpretation: no evidence of acute ischemia    RADIOLOGY  Chest x-ray: I independently viewed and interpreted the images; the central line is in appropriate position with tip in the right atrium.  CT abdomen/pelvis: Pending  PROCEDURES:  Critical Care performed: Yes, see critical care procedure note(s)  .Critical Care  Performed by: Dionne Bucy, MD Authorized by: Dionne Bucy, MD   Critical care provider statement:    Critical care time (minutes):  75   Critical care time was exclusive of:  Separately billable procedures and treating other patients   Critical care was necessary to treat or prevent imminent or life-threatening deterioration of the following conditions:  Circulatory failure, sepsis and shock   Critical care was time spent personally by me on the following activities:  Development of treatment plan with patient or surrogate, discussions with consultants, evaluation of patient's response to treatment, examination of patient, ordering and review of laboratory studies, ordering and review of radiographic studies, ordering and performing treatments and interventions, pulse oximetry, re-evaluation of patient's condition, review of old charts, obtaining history from patient or surrogate and vascular access procedures   Care discussed with: admitting provider   Central Line  Date/Time: 02/28/2024 12:03 AM  Performed by: Dionne Bucy, MD Authorized by: Dionne Bucy, MD   Consent:    Consent obtained:  Verbal   Consent given by:  Patient   Risks, benefits, and alternatives were discussed: yes     Risks discussed:  Arterial puncture, incorrect placement, bleeding, infection and  pneumothorax   Alternatives discussed:  Alternative treatment Universal protocol:    Patient identity confirmed:  Verbally with patient and arm band Pre-procedure details:    Indication(s): central venous access and insufficient peripheral access     Hand hygiene: Hand hygiene performed prior to insertion     Sterile barrier technique: All elements of maximal sterile technique followed     Skin preparation:  Chlorhexidine   Skin preparation agent: Skin preparation agent completely dried prior to procedure   Sedation:    Sedation type:  None Anesthesia:    Anesthesia method:  Local infiltration  Local anesthetic:  Lidocaine 1% w/o epi Procedure details:    Location:  R internal jugular   Patient position:  Trendelenburg   Procedural supplies:  Triple lumen   Landmarks identified: yes     Ultrasound guidance: yes     Ultrasound guidance timing: real time     Sterile ultrasound techniques: Sterile gel and sterile probe covers were used     Number of attempts:  1   Successful placement: yes   Post-procedure details:    Post-procedure:  Dressing applied and line sutured   Assessment:  Blood return through all ports, no pneumothorax on x-ray, free fluid flow and placement verified by x-ray   Procedure completion:  Tolerated well, no immediate complications    MEDICATIONS ORDERED IN ED: Medications  norepinephrine (LEVOPHED) 4mg  in (0.016 mg/mL) premix infusion (19 mcg/min Intravenous Rate/Dose Change 02/27/24 2108)  vasopressin (PITRESSIN) 20 Units in 100 mL (0.2 unit/mL) infusion-*FOR SHOCK* (0.04 Units/min Intravenous New Bag/Given 02/27/24 2141)  docusate sodium (COLACE) capsule 100 mg (has no administration in time range)  polyethylene glycol (MIRALAX / GLYCOLAX) packet 17 g (has no administration in time range)  sodium bicarbonate 150 mEq in sterile water 1,150 mL infusion ( Intravenous New Bag/Given 02/27/24 2143)  pantoprazole (PROTONIX) injection 40 mg (40 mg Intravenous  Given 02/27/24 2147)  latanoprost (XALATAN) 0.005 % ophthalmic solution 1 drop (1 drop Both Eyes Given 02/27/24 2149)  levothyroxine (SYNTHROID) tablet 50 mcg (has no administration in time range)  albuterol (PROVENTIL) (2.5 MG/3ML) 0.083% nebulizer solution 2.5 mg (has no administration in time range)  vancomycin (VANCOCIN) capsule 500 mg (has no administration in time range)  metroNIDAZOLE (FLAGYL) IVPB 500 mg (has no administration in time range)  sodium chloride 0.9 % bolus 1,000 mL (0 mLs Intravenous Stopped 02/27/24 1958)  sodium chloride 0.9 % bolus 1,000 mL (0 mLs Intravenous Stopped 02/27/24 2032)    And  sodium chloride 0.9 % bolus 500 mL (0 mLs Intravenous Stopped 02/27/24 2122)    And  sodium chloride 0.9 % bolus 250 mL (0 mLs Intravenous Stopped 02/27/24 2126)  ceFEPIme (MAXIPIME) 2 g in sodium chloride 0.9 % 100 mL IVPB (0 g Intravenous Stopped 02/27/24 2030)  metroNIDAZOLE (FLAGYL) IVPB 500 mg (0 mg Intravenous Stopped 02/27/24 2057)  vancomycin (VANCOCIN) IVPB 1000 mg/200 mL premix (0 mg Intravenous Stopped 02/27/24 2107)     IMPRESSION / MDM / ASSESSMENT AND PLAN / ED COURSE  I reviewed the triage vital signs and the nursing notes.  68 year old female with PMH as noted above presents with worsening generalized weakness and diarrhea over the last several days.  On exam, she is significantly hypotensive, with a systolic as low as 58.  Other vital signs are normal.  The abdomen is soft with no focal tenderness.  Differential diagnosis includes, but is not limited to, acute infection/sepsis, possible colitis, diverticulitis, UTI, or respiratory source, versus possible acute GI bleed, dehydration, metabolic abnormality.  We will give IV fluids per the sepsis protocol, empiric antibiotics to cover for unknown source, obtain lab workup, chest x-ray, CT abdomen/pelvis, and reassess.  Patient's presentation is most consistent with acute presentation with potential threat to life or bodily  function.  The patient is on the cardiac monitor to evaluate for evidence of arrhythmia and/or significant heart rate changes.  ----------------------------------------- 9:01 PM on 02/27/2024 -----------------------------------------   The patient remained significantly hypotensive despite over a liter of fluids.  Her peripheral access was poor, with a small gauge line in her  right hand and a slightly larger 1 in her right arm which did not draw back.  I attempted IV access several times in the proximal veins of the right and left arms under ultrasound guidance with no success.  The patient's bowel movements were also found to be grossly bloody although her hemoglobin is normal.  Therefore, I proceeded to place a central line under ultrasound guidance for better IV access and ability to give additional fluids and pressors, given her persistent hypotension and critical status.  I have verbally consented the patient due to the emergent nature of the procedure, but performed under full sterile conditions.  The patient tolerated the procedure well with no immediate complications.  Blood pressure immediately started to improve after additional fluids were given and Levophed infusion was initiated.  Lab workup is significant for elevated lactate, significantly elevated WBC count, and acute renal failure.  The patient will go to CT once stabilized.  I consulted APP [place] from the ICU; based on our discussion she agrees to evaluate the patient for ICU admission.   FINAL CLINICAL IMPRESSION(S) / ED DIAGNOSES   Final diagnoses:  Sepsis with acute renal failure and septic shock, due to unspecified organism, unspecified acute renal failure type (HCC)  Pancolitis (HCC)     Rx / DC Orders   ED Discharge Orders     None        Note:  This document was prepared using Dragon voice recognition software and may include unintentional dictation errors.    Dionne Bucy, MD 02/28/24  [phone]

## 2024-02-27 NOTE — ED Notes (Signed)
 Per provider San Jetty, NP, advised to please wait to collect all labs at midnight, including, trop, lactic acid, and blood cultures.

## 2024-02-28 ENCOUNTER — Other Ambulatory Visit: Payer: Self-pay

## 2024-02-28 ENCOUNTER — Inpatient Hospital Stay: Admitting: Certified Registered"

## 2024-02-28 ENCOUNTER — Encounter
Admission: EM | Disposition: A | Discharge status: Home Health | Payer: Self-pay | Source: Home / Self Care | Attending: Student

## 2024-02-28 ENCOUNTER — Encounter: Payer: Self-pay | Admitting: Student in an Organized Health Care Education/Training Program

## 2024-02-28 ENCOUNTER — Inpatient Hospital Stay

## 2024-02-28 DIAGNOSIS — D7289 Other specified disorders of white blood cells: Secondary | ICD-10-CM

## 2024-02-28 DIAGNOSIS — A0472 Enterocolitis due to Clostridium difficile, not specified as recurrent: Secondary | ICD-10-CM

## 2024-02-28 DIAGNOSIS — A419 Sepsis, unspecified organism: Secondary | ICD-10-CM

## 2024-02-28 DIAGNOSIS — I4811 Longstanding persistent atrial fibrillation: Secondary | ICD-10-CM

## 2024-02-28 DIAGNOSIS — K5100 Ulcerative (chronic) pancolitis without complications: Secondary | ICD-10-CM

## 2024-02-28 DIAGNOSIS — R6521 Severe sepsis with septic shock: Secondary | ICD-10-CM | POA: Diagnosis not present

## 2024-02-28 HISTORY — DX: Enterocolitis due to Clostridium difficile, not specified as recurrent: A04.72

## 2024-02-28 HISTORY — PX: COLECTOMY: SHX59

## 2024-02-28 HISTORY — PX: LAPAROTOMY: SHX154

## 2024-02-28 HISTORY — DX: Ulcerative (chronic) pancolitis without complications: K51.00

## 2024-02-28 LAB — BLOOD GAS, VENOUS
Acid-base deficit: 20.8 mmol/L — ABNORMAL HIGH (ref 0.0–2.0)
Acid-base deficit: 18.4 mmol/L — ABNORMAL HIGH (ref 0.0–2.0)
Acid-base deficit: 8.9 mmol/L — ABNORMAL HIGH (ref 0.0–2.0)
Bicarbonate: 7 mmol/L — ABNORMAL LOW (ref 20.0–28.0)
Bicarbonate: 7 mmol/L — ABNORMAL LOW (ref 20.0–28.0)
Bicarbonate: 16 mmol/L — ABNORMAL LOW (ref 20.0–28.0)
O2 Saturation: 77.4 %
O2 Saturation: 78.6 %
O2 Saturation: 96.9 %
Patient temperature: 37
Patient temperature: 37
Patient temperature: 37
pCO2, Ven: 22 mmHg — ABNORMAL LOW (ref 44–60)
pCO2, Ven: <18 mmHg — CL (ref 44–60)
pCO2, Ven: 31 mmHg — ABNORMAL LOW (ref 44–60)
pH, Ven: 7.11 — CL (ref 7.25–7.43)
pH, Ven: 7.22 — ABNORMAL LOW (ref 7.25–7.43)
pH, Ven: 7.32 (ref 7.25–7.43)
pO2, Ven: 54 mmHg — ABNORMAL HIGH (ref 32–45)
pO2, Ven: 52 mmHg — ABNORMAL HIGH (ref 32–45)
pO2, Ven: 78 mmHg — ABNORMAL HIGH (ref 32–45)

## 2024-02-28 LAB — CBC
HCT: 34.9 % — ABNORMAL LOW (ref 36.0–46.0)
HCT: 32.9 % — ABNORMAL LOW (ref 36.0–46.0)
Hemoglobin: 11.3 g/dL — ABNORMAL LOW (ref 12.0–15.0)
Hemoglobin: 11 g/dL — ABNORMAL LOW (ref 12.0–15.0)
MCH: 32 pg (ref 26.0–34.0)
MCH: 31.7 pg (ref 26.0–34.0)
MCHC: 32.4 g/dL (ref 30.0–36.0)
MCHC: 33.4 g/dL (ref 30.0–36.0)
MCV: 98.9 fL (ref 80.0–100.0)
MCV: 94.8 fL (ref 80.0–100.0)
Platelets: 303 10*3/uL (ref 150–400)
Platelets: 285 10*3/uL (ref 150–400)
RBC: 3.53 MIL/uL — ABNORMAL LOW (ref 3.87–5.11)
RBC: 3.47 MIL/uL — ABNORMAL LOW (ref 3.87–5.11)
RDW: 17.5 % — ABNORMAL HIGH (ref 11.5–15.5)
RDW: 17.2 % — ABNORMAL HIGH (ref 11.5–15.5)
WBC: 45.4 10*3/uL — ABNORMAL HIGH (ref 4.0–10.5)
WBC: 43.1 10*3/uL — ABNORMAL HIGH (ref 4.0–10.5)
nRBC: 0 % (ref 0.0–0.2)
nRBC: 0 % (ref 0.0–0.2)

## 2024-02-28 LAB — HEMOGLOBIN AND HEMATOCRIT, BLOOD
HCT: 30.1 % — ABNORMAL LOW (ref 36.0–46.0)
HCT: 30.3 % — ABNORMAL LOW (ref 36.0–46.0)
HCT: 30.4 % — ABNORMAL LOW (ref 36.0–46.0)
Hemoglobin: 10.1 g/dL — ABNORMAL LOW (ref 12.0–15.0)
Hemoglobin: 10.2 g/dL — ABNORMAL LOW (ref 12.0–15.0)
Hemoglobin: 10.3 g/dL — ABNORMAL LOW (ref 12.0–15.0)

## 2024-02-28 LAB — OSMOLALITY: Osmolality: 308 mosm/kg — ABNORMAL HIGH (ref 275–295)

## 2024-02-28 LAB — COMPREHENSIVE METABOLIC PANEL WITH GFR
ALT: 19 U/L (ref 0–44)
AST: 32 U/L (ref 15–41)
Albumin: 2.9 g/dL — ABNORMAL LOW (ref 3.5–5.0)
Alkaline Phosphatase: 106 U/L (ref 38–126)
Anion gap: 23 — ABNORMAL HIGH (ref 5–15)
BUN: 82 mg/dL — ABNORMAL HIGH (ref 8–23)
CO2: 14 mmol/L — ABNORMAL LOW (ref 22–32)
Calcium: 7.5 mg/dL — ABNORMAL LOW (ref 8.9–10.3)
Chloride: 93 mmol/L — ABNORMAL LOW (ref 98–111)
Creatinine, Ser: 5.73 mg/dL — ABNORMAL HIGH (ref 0.44–1.00)
GFR, Estimated: 8 mL/min — ABNORMAL LOW (ref >60)
Glucose, Bld: 113 mg/dL — ABNORMAL HIGH (ref 70–99)
Potassium: 3.3 mmol/L — ABNORMAL LOW (ref 3.5–5.1)
Sodium: 130 mmol/L — ABNORMAL LOW (ref 135–145)
Total Bilirubin: 0.9 mg/dL (ref 0.0–1.2)
Total Protein: 5.8 g/dL — ABNORMAL LOW (ref 6.5–8.1)

## 2024-02-28 LAB — PROCALCITONIN
Procalcitonin: 41.08 ng/mL
Procalcitonin: 38.89 ng/mL

## 2024-02-28 LAB — GASTROINTESTINAL PANEL BY PCR, STOOL (REPLACES STOOL CULTURE)

## 2024-02-28 LAB — LACTIC ACID, PLASMA
Lactic Acid, Venous: 4.6 mmol/L (ref 0.5–1.9)
Lactic Acid, Venous: 6.4 mmol/L (ref 0.5–1.9)
Lactic Acid, Venous: 7.9 mmol/L (ref 0.5–1.9)
Lactic Acid, Venous: 5.6 mmol/L (ref 0.5–1.9)
Lactic Acid, Venous: 4.6 mmol/L (ref 0.5–1.9)
Lactic Acid, Venous: 3.4 mmol/L (ref 0.5–1.9)

## 2024-02-28 LAB — CBC WITH DIFFERENTIAL/PLATELET
Abs Immature Granulocytes: 1.1 10*3/uL — ABNORMAL HIGH (ref 0.00–0.07)
Basophils Absolute: 0.2 10*3/uL — ABNORMAL HIGH (ref 0.0–0.1)
Basophils Relative: 1 %
Eosinophils Absolute: 0.1 10*3/uL (ref 0.0–0.5)
Eosinophils Relative: 0 %
HCT: 30.6 % — ABNORMAL LOW (ref 36.0–46.0)
Hemoglobin: 10.2 g/dL — ABNORMAL LOW (ref 12.0–15.0)
Immature Granulocytes: 3 %
Lymphocytes Relative: 3 %
Lymphs Abs: 0.8 10*3/uL (ref 0.7–4.0)
MCH: 31.7 pg (ref 26.0–34.0)
MCHC: 33.3 g/dL (ref 30.0–36.0)
MCV: 95 fL (ref 80.0–100.0)
Monocytes Absolute: 1.1 10*3/uL — ABNORMAL HIGH (ref 0.1–1.0)
Monocytes Relative: 3 %
Neutro Abs: 29.6 10*3/uL — ABNORMAL HIGH (ref 1.7–7.7)
Neutrophils Relative %: 90 %
Platelets: 268 10*3/uL (ref 150–400)
RBC: 3.22 MIL/uL — ABNORMAL LOW (ref 3.87–5.11)
RDW: 17.1 % — ABNORMAL HIGH (ref 11.5–15.5)
Smear Review: NORMAL
WBC: 32.9 10*3/uL — ABNORMAL HIGH (ref 4.0–10.5)
nRBC: 0 % (ref 0.0–0.2)

## 2024-02-28 LAB — BASIC METABOLIC PANEL WITH GFR
Anion gap: 26 — ABNORMAL HIGH (ref 5–15)
Anion gap: 26 — ABNORMAL HIGH (ref 5–15)
Anion gap: 28 — ABNORMAL HIGH (ref 5–15)
Anion gap: 25 — ABNORMAL HIGH (ref 5–15)
BUN: 86 mg/dL — ABNORMAL HIGH (ref 8–23)
BUN: 84 mg/dL — ABNORMAL HIGH (ref 8–23)
BUN: 84 mg/dL — ABNORMAL HIGH (ref 8–23)
BUN: 80 mg/dL — ABNORMAL HIGH (ref 8–23)
CO2: 7 mmol/L — ABNORMAL LOW (ref 22–32)
CO2: 9 mmol/L — ABNORMAL LOW (ref 22–32)
CO2: 10 mmol/L — ABNORMAL LOW (ref 22–32)
CO2: 15 mmol/L — ABNORMAL LOW (ref 22–32)
Calcium: 6.3 mg/dL — CL (ref 8.9–10.3)
Calcium: 6.8 mg/dL — ABNORMAL LOW (ref 8.9–10.3)
Calcium: 6.9 mg/dL — ABNORMAL LOW (ref 8.9–10.3)
Calcium: 7.3 mg/dL — ABNORMAL LOW (ref 8.9–10.3)
Chloride: 90 mmol/L — ABNORMAL LOW (ref 98–111)
Chloride: 89 mmol/L — ABNORMAL LOW (ref 98–111)
Chloride: 92 mmol/L — ABNORMAL LOW (ref 98–111)
Chloride: 89 mmol/L — ABNORMAL LOW (ref 98–111)
Creatinine, Ser: 6.15 mg/dL — ABNORMAL HIGH (ref 0.44–1.00)
Creatinine, Ser: 6.17 mg/dL — ABNORMAL HIGH (ref 0.44–1.00)
Creatinine, Ser: 6.19 mg/dL — ABNORMAL HIGH (ref 0.44–1.00)
Creatinine, Ser: 5.38 mg/dL — ABNORMAL HIGH (ref 0.44–1.00)
GFR, Estimated: 7 mL/min — ABNORMAL LOW (ref >60)
GFR, Estimated: 7 mL/min — ABNORMAL LOW (ref >60)
GFR, Estimated: 7 mL/min — ABNORMAL LOW (ref >60)
GFR, Estimated: 8 mL/min — ABNORMAL LOW (ref >60)
Glucose, Bld: 224 mg/dL — ABNORMAL HIGH (ref 70–99)
Glucose, Bld: 228 mg/dL — ABNORMAL HIGH (ref 70–99)
Glucose, Bld: 130 mg/dL — ABNORMAL HIGH (ref 70–99)
Glucose, Bld: 204 mg/dL — ABNORMAL HIGH (ref 70–99)
Potassium: 3.5 mmol/L (ref 3.5–5.1)
Potassium: 3.7 mmol/L (ref 3.5–5.1)
Potassium: 3.3 mmol/L — ABNORMAL LOW (ref 3.5–5.1)
Potassium: 3.3 mmol/L — ABNORMAL LOW (ref 3.5–5.1)
Sodium: 123 mmol/L — ABNORMAL LOW (ref 135–145)
Sodium: 124 mmol/L — ABNORMAL LOW (ref 135–145)
Sodium: 130 mmol/L — ABNORMAL LOW (ref 135–145)
Sodium: 129 mmol/L — ABNORMAL LOW (ref 135–145)

## 2024-02-28 LAB — T4, FREE: Free T4: 0.78 ng/dL (ref 0.61–1.12)

## 2024-02-28 LAB — GLUCOSE, CAPILLARY
Glucose-Capillary: 177 mg/dL — ABNORMAL HIGH (ref 70–99)
Glucose-Capillary: 196 mg/dL — ABNORMAL HIGH (ref 70–99)
Glucose-Capillary: 203 mg/dL — ABNORMAL HIGH (ref 70–99)
Glucose-Capillary: 97 mg/dL (ref 70–99)
Glucose-Capillary: 170 mg/dL — ABNORMAL HIGH (ref 70–99)
Glucose-Capillary: 200 mg/dL — ABNORMAL HIGH (ref 70–99)

## 2024-02-28 LAB — PROTIME-INR
INR: 5 (ref 0.8–1.2)
INR: 3 — ABNORMAL HIGH (ref 0.8–1.2)
Prothrombin Time: 46.9 s — ABNORMAL HIGH (ref 11.4–15.2)
Prothrombin Time: 31.8 s — ABNORMAL HIGH (ref 11.4–15.2)

## 2024-02-28 LAB — HEPATIC FUNCTION PANEL
ALT: 20 U/L (ref 0–44)
AST: 33 U/L (ref 15–41)
Albumin: 2.8 g/dL — ABNORMAL LOW (ref 3.5–5.0)
Alkaline Phosphatase: 109 U/L (ref 38–126)
Bilirubin, Direct: 0.2 mg/dL (ref 0.0–0.2)
Indirect Bilirubin: 1 mg/dL — ABNORMAL HIGH (ref 0.3–0.9)
Total Bilirubin: 1.2 mg/dL (ref 0.0–1.2)
Total Protein: 5.8 g/dL — ABNORMAL LOW (ref 6.5–8.1)

## 2024-02-28 LAB — MAGNESIUM
Magnesium: 1.5 mg/dL — ABNORMAL LOW (ref 1.7–2.4)
Magnesium: 1.6 mg/dL — ABNORMAL LOW (ref 1.7–2.4)

## 2024-02-28 LAB — BLOOD GAS, ARTERIAL
Acid-base deficit: 11.6 mmol/L — ABNORMAL HIGH (ref 0.0–2.0)
Bicarbonate: 14.4 mmol/L — ABNORMAL LOW (ref 20.0–28.0)
FIO2: 30 %
MECHVT: 450 mL
O2 Saturation: 99.8 %
PEEP: 5 cmH2O
Patient temperature: 37
RATE: 18 {breaths}/min
pCO2 arterial: 32 mmHg (ref 32–48)
pH, Arterial: 7.26 — ABNORMAL LOW (ref 7.35–7.45)
pO2, Arterial: 127 mmHg — ABNORMAL HIGH (ref 83–108)

## 2024-02-28 LAB — TSH: TSH: 5.143 u[IU]/mL — ABNORMAL HIGH (ref 0.350–4.500)

## 2024-02-28 LAB — BETA-HYDROXYBUTYRIC ACID: Beta-Hydroxybutyric Acid: 3.42 mmol/L — ABNORMAL HIGH (ref 0.05–0.27)

## 2024-02-28 LAB — BRAIN NATRIURETIC PEPTIDE: B Natriuretic Peptide: 579.8 pg/mL — ABNORMAL HIGH (ref 0.0–100.0)

## 2024-02-28 LAB — HEMOGLOBIN A1C
Hgb A1c MFr Bld: 5.9 % — ABNORMAL HIGH (ref 4.8–5.6)
Mean Plasma Glucose: 122.63 mg/dL

## 2024-02-28 LAB — TROPONIN I (HIGH SENSITIVITY)
Troponin I (High Sensitivity): 78 ng/L — ABNORMAL HIGH (ref <18)
Troponin I (High Sensitivity): 89 ng/L — ABNORMAL HIGH (ref <18)

## 2024-02-28 LAB — PHOSPHORUS
Phosphorus: 8.2 mg/dL — ABNORMAL HIGH (ref 2.5–4.6)
Phosphorus: 7.9 mg/dL — ABNORMAL HIGH (ref 2.5–4.6)

## 2024-02-28 LAB — CK: Total CK: 53 U/L (ref 38–234)

## 2024-02-28 MED ORDER — ONDANSETRON HCL 4 MG/2ML IJ SOLN
INTRAMUSCULAR | Status: DC | PRN
Start: 2024-02-28 — End: 2024-02-28

## 2024-02-28 MED ORDER — CALCIUM CHLORIDE 10 % IV SOLN
INTRAVENOUS | Status: AC
  Filled 2024-02-28: qty 10

## 2024-02-28 MED ORDER — DOCUSATE SODIUM 50 MG/5ML PO LIQD: 100.0000 mg | Freq: Two times a day (BID) | ORAL | Status: DC

## 2024-02-28 MED ORDER — HYDROCORTISONE SOD SUC (PF) 100 MG IJ SOLR: 100.0000 mg | Freq: Once | INTRAMUSCULAR | Status: DC

## 2024-02-28 MED ORDER — PHENYLEPHRINE 80 MCG/ML (10ML) SYRINGE FOR IV PUSH (FOR BLOOD PRESSURE SUPPORT)
PREFILLED_SYRINGE | INTRAVENOUS | Status: DC | PRN
  Administered 2024-02-28: 80 ug via INTRAVENOUS
  Administered 2024-02-28 (×4): 160 ug via INTRAVENOUS

## 2024-02-28 MED ORDER — POTASSIUM CHLORIDE 10 MEQ/50ML IV SOLN
10.0000 meq | INTRAVENOUS | Status: AC
  Administered 2024-02-28 – 2024-02-29 (×3): 10 meq via INTRAVENOUS
  Filled 2024-02-28 (×3): qty 50

## 2024-02-28 MED ORDER — ROCURONIUM BROMIDE 10 MG/ML (PF) SYRINGE
PREFILLED_SYRINGE | INTRAVENOUS | Status: AC
  Filled 2024-02-28: qty 10

## 2024-02-28 MED ORDER — MIDAZOLAM HCL 2 MG/2ML IJ SOLN
INTRAMUSCULAR | Status: AC
  Filled 2024-02-28: qty 2

## 2024-02-28 MED ORDER — SUCCINYLCHOLINE CHLORIDE 200 MG/10ML IV SOSY
PREFILLED_SYRINGE | INTRAVENOUS | Status: DC | PRN
  Administered 2024-02-28: 100 mg via INTRAVENOUS

## 2024-02-28 MED ORDER — ONDANSETRON HCL 4 MG/2ML IJ SOLN
INTRAMUSCULAR | Status: AC
  Filled 2024-02-28: qty 2

## 2024-02-28 MED ORDER — CALCIUM GLUCONATE-NACL 2-0.675 GM/100ML-% IV SOLN
2.0000 g | Freq: Once | INTRAVENOUS | Status: AC
  Administered 2024-02-28: 2000 mg via INTRAVENOUS
  Filled 2024-02-28: qty 100

## 2024-02-28 MED ORDER — PROPOFOL 10 MG/ML IV BOLUS
INTRAVENOUS | Status: DC | PRN
  Administered 2024-02-28: 100 mg via INTRAVENOUS

## 2024-02-28 MED ORDER — PIPERACILLIN-TAZOBACTAM IN DEX 2-0.25 GM/50ML IV SOLN
2.2500 g | Freq: Three times a day (TID) | INTRAVENOUS | Status: AC
  Administered 2024-02-28 – 2024-03-03 (×14): 2.25 g via INTRAVENOUS
  Filled 2024-02-28 (×15): qty 50

## 2024-02-28 MED ORDER — ROCURONIUM BROMIDE 100 MG/10ML IV SOLN
INTRAVENOUS | Status: DC | PRN
  Administered 2024-02-28 (×2): 50 mg via INTRAVENOUS

## 2024-02-28 MED ORDER — SODIUM CHLORIDE 0.9% IV SOLUTION
Freq: Once | INTRAVENOUS | Status: AC
  Administered 2024-02-28: 400 mL via INTRAVENOUS
  Administered 2024-02-28: 200 mL via INTRAVENOUS

## 2024-02-28 MED ORDER — 0.9 % SODIUM CHLORIDE (POUR BTL) OPTIME
TOPICAL | Status: DC | PRN
  Administered 2024-02-28: 3000 mL

## 2024-02-28 MED ORDER — PROPOFOL 1000 MG/100ML IV EMUL
INTRAVENOUS | Status: AC
  Filled 2024-02-28: qty 100

## 2024-02-28 MED ORDER — PROPOFOL 10 MG/ML IV BOLUS
INTRAVENOUS | Status: AC
  Filled 2024-02-28: qty 20

## 2024-02-28 MED ORDER — VANCOMYCIN HCL 125 MG PO CAPS
500.0000 mg | ORAL_CAPSULE | Freq: Once | ORAL | Status: AC
  Administered 2024-02-28: 500 mg via ORAL

## 2024-02-28 MED ORDER — FENTANYL CITRATE PF 50 MCG/ML IJ SOSY
25.0000 ug | PREFILLED_SYRINGE | INTRAMUSCULAR | Status: DC | PRN
  Administered 2024-02-28: 25 ug via INTRAVENOUS
  Filled 2024-02-28: qty 1

## 2024-02-28 MED ORDER — PROPOFOL 500 MG/50ML IV EMUL
INTRAVENOUS | Status: DC | PRN
  Administered 2024-02-28: 50 ug/kg/min via INTRAVENOUS

## 2024-02-28 MED ORDER — ORAL CARE MOUTH RINSE: 15.0000 mL | OROMUCOSAL | Status: DC | PRN

## 2024-02-28 MED ORDER — VANCOMYCIN HCL 125 MG PO CAPS: 500.0000 mg | ORAL_CAPSULE | Freq: Four times a day (QID) | ORAL | Status: DC

## 2024-02-28 MED ORDER — CALCIUM CHLORIDE 10 % IV SOLN
INTRAVENOUS | Status: DC | PRN
Start: 2024-02-28 — End: 2024-02-28
  Administered 2024-02-28: .5 g via INTRAVENOUS

## 2024-02-28 MED ORDER — POTASSIUM CHLORIDE 10 MEQ/50ML IV SOLN
10.0000 meq | INTRAVENOUS | Status: AC
  Administered 2024-02-28 (×2): 10 meq via INTRAVENOUS
  Filled 2024-02-28 (×2): qty 50

## 2024-02-28 MED ORDER — FENTANYL CITRATE (PF) 100 MCG/2ML IJ SOLN
INTRAMUSCULAR | Status: AC
  Filled 2024-02-28: qty 2

## 2024-02-28 MED ORDER — LIDOCAINE HCL (CARDIAC) PF 100 MG/5ML IV SOSY
PREFILLED_SYRINGE | INTRAVENOUS | Status: DC | PRN
  Administered 2024-02-28: 100 mg via INTRAVENOUS

## 2024-02-28 MED ORDER — NOREPINEPHRINE 16 MG/250ML-% IV SOLN
0.0000 ug/min | INTRAVENOUS | Status: DC
  Administered 2024-02-28: 15 ug/min via INTRAVENOUS
  Administered 2024-02-28: 17 ug/min via INTRAVENOUS
  Administered 2024-02-29: 14 ug/min via INTRAVENOUS
  Administered 2024-03-02 – 2024-03-03 (×2): 6 ug/min via INTRAVENOUS
  Filled 2024-02-28 (×4): qty 250

## 2024-02-28 MED ORDER — INSULIN ASPART 100 UNIT/ML IJ SOLN
0.0000 [IU] | INTRAMUSCULAR | Status: DC
  Administered 2024-02-28 (×2): 2 [IU] via SUBCUTANEOUS
  Administered 2024-02-28: 3 [IU] via SUBCUTANEOUS
  Administered 2024-02-28 – 2024-02-29 (×2): 2 [IU] via SUBCUTANEOUS
  Administered 2024-02-29: 1 [IU] via SUBCUTANEOUS
  Administered 2024-02-29 – 2024-03-01 (×4): 2 [IU] via SUBCUTANEOUS
  Administered 2024-03-01 (×4): 1 [IU] via SUBCUTANEOUS
  Administered 2024-03-02: 2 [IU] via SUBCUTANEOUS
  Administered 2024-03-02 (×2): 3 [IU] via SUBCUTANEOUS
  Administered 2024-03-02 (×2): 5 [IU] via SUBCUTANEOUS
  Administered 2024-03-02: 3 [IU] via SUBCUTANEOUS
  Filled 2024-02-28 (×19): qty 1

## 2024-02-28 MED ORDER — ACETAMINOPHEN 325 MG PO TABS: 650.0000 mg | ORAL_TABLET | Freq: Four times a day (QID) | ORAL | Status: DC | PRN

## 2024-02-28 MED ORDER — CHLORHEXIDINE GLUCONATE CLOTH 2 % EX PADS
6.0000 | MEDICATED_PAD | Freq: Every day | CUTANEOUS | Status: DC
  Administered 2024-02-28 – 2024-03-12 (×14): 6 via TOPICAL
  Filled 2024-02-28: qty 6

## 2024-02-28 MED ORDER — POLYETHYLENE GLYCOL 3350 17 G PO PACK: 17.0000 g | PACK | Freq: Every day | ORAL | Status: DC

## 2024-02-28 MED ORDER — FENTANYL CITRATE (PF) 100 MCG/2ML IJ SOLN
INTRAMUSCULAR | Status: DC | PRN
  Administered 2024-02-28: 25 ug via INTRAVENOUS
  Administered 2024-02-28: 50 ug via INTRAVENOUS

## 2024-02-28 MED ORDER — SODIUM BICARBONATE 8.4 % IV SOLN
100.0000 meq | Freq: Once | INTRAVENOUS | Status: AC
  Administered 2024-02-28: 100 meq via INTRAVENOUS
  Filled 2024-02-28: qty 100

## 2024-02-28 MED ORDER — ORAL CARE MOUTH RINSE
15.0000 mL | OROMUCOSAL | Status: DC
  Administered 2024-02-28 – 2024-02-29 (×5): 15 mL via OROMUCOSAL

## 2024-02-28 MED ORDER — MAGNESIUM SULFATE IN D5W 1-5 GM/100ML-% IV SOLN
1.0000 g | Freq: Once | INTRAVENOUS | Status: AC
  Administered 2024-02-28: 1 g via INTRAVENOUS
  Filled 2024-02-28: qty 100

## 2024-02-28 MED ORDER — DEXAMETHASONE SODIUM PHOSPHATE 10 MG/ML IJ SOLN
INTRAMUSCULAR | Status: DC | PRN
  Administered 2024-02-28: 5 mg via INTRAVENOUS

## 2024-02-28 MED ORDER — ONDANSETRON HCL 4 MG/2ML IJ SOLN
INTRAMUSCULAR | Status: DC | PRN
  Administered 2024-02-28: 4 mg via INTRAVENOUS

## 2024-02-28 MED ORDER — FENTANYL CITRATE PF 50 MCG/ML IJ SOSY
50.0000 ug | PREFILLED_SYRINGE | INTRAMUSCULAR | Status: DC | PRN
Start: 2024-02-28 — End: 2024-02-28

## 2024-02-28 MED ORDER — SODIUM CHLORIDE 0.9% IV SOLUTION: Freq: Once | INTRAVENOUS | Status: DC

## 2024-02-28 MED ORDER — FENTANYL CITRATE PF 50 MCG/ML IJ SOSY: 50.0000 ug | PREFILLED_SYRINGE | INTRAMUSCULAR | Status: DC | PRN

## 2024-02-28 MED ORDER — FENTANYL CITRATE PF 50 MCG/ML IJ SOSY
25.0000 ug | PREFILLED_SYRINGE | INTRAMUSCULAR | Status: DC | PRN
  Administered 2024-02-28: 50 ug via INTRAVENOUS
  Filled 2024-02-28: qty 1

## 2024-02-28 MED ORDER — PROPOFOL 1000 MG/100ML IV EMUL
0.0000 ug/kg/min | INTRAVENOUS | Status: DC
  Administered 2024-02-28: 35 ug/kg/min via INTRAVENOUS
  Administered 2024-02-28: 30 ug/kg/min via INTRAVENOUS
  Administered 2024-02-29: 50 ug/kg/min via INTRAVENOUS
  Administered 2024-02-29: 45 ug/kg/min via INTRAVENOUS
  Filled 2024-02-28 (×3): qty 100

## 2024-02-28 MED ORDER — SODIUM CHLORIDE 0.9 % IV SOLN: INTRAVENOUS | Status: DC | PRN

## 2024-02-28 MED ORDER — LIDOCAINE 5 % EX PTCH
1.0000 | MEDICATED_PATCH | Freq: Every day | CUTANEOUS | Status: DC
  Administered 2024-02-28 – 2024-03-12 (×14): 1 via TRANSDERMAL
  Filled 2024-02-28 (×14): qty 1

## 2024-02-28 MED ORDER — DEXAMETHASONE SODIUM PHOSPHATE 10 MG/ML IJ SOLN
INTRAMUSCULAR | Status: AC
  Filled 2024-02-28: qty 1

## 2024-02-28 NOTE — H&P (View-Only) (Signed)
 [facility] SURGICAL CONSULTATION NOTE (initial) - cpt: 99254   HISTORY OF PRESENT ILLNESS (HPI):  68 y.o. female who presented to [facility] on 04/01 secondary to diarrhea. Patient, and friend at bedside, report she was admitted to to [facility] earlier in March for CHF exacerbation. Reportedly had "27 pounds of water weight" removed. During that admission, she was reportedly diagnosed with C diff infection. Patient's friend reports that since discharge she continued to have diarrhea and over the last 4 days has significantly worsened. She report fatigue, abdominal pain, distension, nausea, emesis, and bloody diarrhea. Due to this, she presents to our ED. Patient has a quite significant cardiac history including atrial fibrillation s/p Watchmen procedure in January 2025 and on Eliquis, ischemic cardiomyopathy, ASD repair, CAD s/p CABG x3. Also with DM and CKD. Only previous abdominal surgery is ventral hernia. Work up in the ED revealed leukocytosis to 36.5K (now 43.1K), Hgb to 12.6, sCr - 7.04, hyponatremia to 127, initial lactate 8.3 (initial improved by started to worsen again, now 7.9), C diff toxin and antigen positive. Additionally INR 5.0. She did have CT Abdomen/Pelvis which was concerning for pancolitis and inflammatory changes around the entire colon. She was admitted to the ICU. She is currently on Levophed and Vasopressin. She continues to be tachycardic to the 120 bpm.   Surgery is consulted by PCCM physician Dr. Raechel Chute, MD in this context for evaluation and management of sepsis and shock secondary to pancolitis vs toxic megacolon.  PAST MEDICAL HISTORY (PMH):  Past Medical History:  Diagnosis Date   AC (acromioclavicular) joint bone spurs, right    Anemia    Arthritis    Atrial fibrillation (HCC)    a.) CHA2DS2-VASc = 7 (age, sex, HFrEF, HTN, TIA x 2, T2DM). b.) rate/rhythm controlled on oral carvedilol; chronically anticoagulated with warfarin.   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 b.) R/LHC 03/29/2017: small RCA with occluded SVG; no significant Dz. Chronically occluded LAD with patent SVG to D1/LAD. Insignificant Dz in LCx. LM normal.   Cortical cataract    DOE (dyspnea on exertion)    DOE (dyspnea on exertion)    GERD (gastroesophageal reflux disease)    HFrEF (heart failure with reduced ejection fraction) (HCC)    a.) TTE 10/27/2014: EF 40%; glob HK; mild BAE; triv AR, mild MR/PR, mod TR.  b.) TTE 03/15/2017: EF 30%; glob HK; mod BAE; mod pHTN (RVSP 54.8 mmHg); mild PR, mod MR; sev TR.  c.) R/LHC 03/29/2017: EF 30-35%. d.)TTE 10/25/2018: EF 35%; mod BAE; mod BVE; glob HK; triv PR, mod MR, sev TR; RVSP 57.7 mmHg; G2DD.   History of shingles 2004   Hyperlipidemia    Hypertension    Hypothyroidism    IBS (irritable bowel syndrome)    Ischemic cardiomyopathy    a.) TTE 10/27/2014: EF 40%. b.) TTE 03/15/2017: EF 30%. c.) R/LHC 04/01/2017: EF 30-35%. d.) TTE 10/25/2018: EF 35%   Lumbar scoliosis    Lumbar spinal stenosis    Migraines    Osteoporosis    Pulmonary HTN (HCC)    a.) TTE 03/15/2017: EF 30-35%; RVSP 54.8 mmHg. b.) R/LHC 03/29/2017: mean PA 33 mmHg, PCWP 22 mmHg, LVEDP 14 mmHg, mean AO 79 mmHg; CO 7.89 L/min, CI 4.61 L/min/m   S/P CABG x  3 01/28/2004   Sleep apnea    T2DM (type 2 diabetes mellitus) (HCC)    TIA (transient ischemic attack) 05/29/2016   Vitamin B 12 deficiency      PAST SURGICAL HISTORY (PSH):  Past Surgical History:  Procedure Laterality Date   CARDIAC CATHETERIZATION     CATARACT EXTRACTION W/ INTRAOCULAR LENS IMPLANT Bilateral    Cataract Extraction with IOL   COLONOSCOPY N/A 12/19/2018   Procedure: COLONOSCOPY;  Surgeon: Pasty Spillers, MD;  Location: [facility] ENDOSCOPY;  Service: Endoscopy;  Laterality: N/A;   COLONOSCOPY WITH PROPOFOL N/A 06/17/2020    Procedure: COLONOSCOPY WITH PROPOFOL;  Surgeon: Pasty Spillers, MD;  Location: [facility] ENDOSCOPY;  Service: Endoscopy;  Laterality: N/A;   COLONOSCOPY WITH PROPOFOL N/A 03/30/2023   Procedure: COLONOSCOPY WITH PROPOFOL;  Surgeon: Jaynie Collins, DO;  Location[facility] ENDOSCOPY;  Service: Endoscopy;  Laterality: N/A;   COR TRIATRIATUM REPAIR N/A 01/28/2004   CORONARY ARTERY BYPASS GRAFT N/A 01/28/2004   3v CABG   ESOPHAGOGASTRODUODENOSCOPY N/A 12/19/2018   Procedure: ESOPHAGOGASTRODUODENOSCOPY (EGD);  Surgeon: Pasty Spillers, MD;  Location: [facility] ENDOSCOPY;  Service: Endoscopy;  Laterality: N/A;   ESOPHAGOGASTRODUODENOSCOPY (EGD) WITH PROPOFOL N/A 06/17/2020   Procedure: ESOPHAGOGASTRODUODENOSCOPY (EGD) WITH PROPOFOL;  Surgeon: Pasty Spillers, MD;  Location: [facility] ENDOSCOPY;  Service: Endoscopy;  Laterality: N/A;   ESOPHAGOGASTRODUODENOSCOPY (EGD) WITH PROPOFOL N/A 03/30/2023   Procedure: ESOPHAGOGASTRODUODENOSCOPY (EGD) WITH PROPOFOL;  Surgeon: Jaynie Collins, DO;  Location: [facility] ENDOSCOPY;  Service: Endoscopy;  Laterality: N/A;   RIGHT/LEFT HEART CATH AND CORONARY ANGIOGRAPHY Bilateral 03/29/2017   Procedure: Right/Left Heart Cath and Coronary Angiography;  Surgeon: Dalia Heading, MD;  Location: [facility] INVASIVE CV LAB;  Service: Cardiovascular;  Laterality: Bilateral;   TOTAL KNEE ARTHROPLASTY Right 04/05/2022   Procedure: TOTAL KNEE ARTHROPLASTY;  Surgeon: Kennedy Bucker, MD;  Location: [facility] ORS;  Service: Orthopedics;  Laterality: Right;   TUBAL LIGATION     VENTRAL HERNIA REPAIR N/A 04/21/2017   Procedure: HERNIA REPAIR VENTRAL ADULT;  Surgeon: Nadeen Landau, MD;  Location: [facility] ORS;  Service: General;  Laterality: N/A;     MEDICATIONS:  Prior to Admission medications   Medication Sig Start Date End Date Taking? Authorizing Provider  carvedilol (COREG) 3.125 MG tablet Take 3.125 mg by mouth 2 (two) times daily. 12/08/23  Yes [provider]  ENTRESTO 24-26  MG Take 1 tablet by mouth 2 (two) times daily. 12/27/23  Yes [provider]  albuterol (PROVENTIL HFA;VENTOLIN HFA) 108 (90 Base) MCG/ACT inhaler Inhale 2 puffs into the lungs every 6 (six) hours as needed for wheezing or shortness of breath.    [provider]  aspirin EC 81 MG tablet Take 81 mg by mouth daily. Patient not taking: Reported on 07/21/2023    [provider]  carvedilol (COREG) 6.25 MG tablet Take 6.25 mg by mouth 2 (two) times daily with a meal. Patient not taking: Reported on 02/23/2024 01/06/23   [provider]  ELIQUIS 5 MG TABS tablet Take 5 mg by mouth 2 (two) times daily.    [provider]  fluticasone (FLONASE) 50 MCG/ACT nasal spray Place 1 spray into both nostrils at bedtime. 02/05/19   [provider]  furosemide (LASIX) 40 MG tablet Take 40 mg by mouth daily. Pt stated she take 40 mg once per day Patient not taking: Reported on 02/23/2024    [provider]  JARDIANCE 10 MG TABS tablet Take 10 mg by mouth daily.  [provider]  latanoprost (XALATAN) 0.005 % ophthalmic solution Place 1 drop into both eyes at bedtime.    [provider]  levothyroxine (SYNTHROID) 50 MCG tablet Take 50 mcg by mouth daily before breakfast.    [provider]  lovastatin (MEVACOR) 10 MG tablet Take 10 mg by mouth at bedtime. Patient not taking: Reported on 02/23/2024    [provider]  metFORMIN (GLUCOPHAGE) 1000 MG tablet Take 1,000 mg by mouth 2 (two) times daily with a meal.    [provider]  ONETOUCH ULTRA test strip  10/15/22   [provider]  pantoprazole (PROTONIX) 40 MG tablet Take 40 mg by mouth daily. 01/28/21   [provider]  polyethylene glycol-electrolytes (NULYTELY) 420 g solution Take by mouth. Patient not taking: Reported on 02/23/2024 06/30/23   [provider]  rosuvastatin (CRESTOR) 5 MG tablet Take by mouth. 02/20/24 02/19/25  [provider]  sacubitril-valsartan (ENTRESTO) 49-51 MG Take 1 tablet by mouth 2 (two) times daily. Patient not taking: Reported on 02/23/2024    [provider]  spironolactone (ALDACTONE) 25 MG tablet Take 25 mg by mouth daily.    [provider]  torsemide (DEMADEX) 20 MG tablet Take by mouth. 02/20/24 02/19/25  [provider]  valsartan (DIOVAN) 40 MG tablet Take by mouth. 02/20/24 02/19/25  [provider]  vitamin B-12 (CYANOCOBALAMIN) 1000 MCG tablet Take 1,000 mcg by mouth daily.    [provider]     ALLERGIES:  Allergies  Allergen Reactions   Vioxx [Rofecoxib] Swelling     SOCIAL HISTORY:  Social History   Socioeconomic History   Marital status: Widowed    Spouse name: Not on file   Number of children: Not on file   Years of education: Not on file   Highest education level: Not on file  Occupational History   Occupation: certified nursing assistant  Tobacco Use   Smoking status: Former    Current packs/day: 0.00    Types: Cigarettes    Quit date: 03/30/1999    Years since quitting: 24.9   Smokeless tobacco: Never  Vaping Use   Vaping status: Never Used  Substance and Sexual Activity   Alcohol use: No   Drug use: No   Sexual activity: Not Currently  Other Topics Concern   Not on file  Social History Narrative   Not on file   Social Drivers of Health   Financial Resource Strain: Low Risk  (02/11/2024)   Received from [facility]   Overall Financial Resource Strain (CARDIA)    Difficulty of Paying Living Expenses: Not very hard  Recent Concern: Financial Resource Strain - Medium Risk (12/13/2023)   Received from [facility]   Overall Financial Resource Strain (CARDIA)    Difficulty of Paying Living Expenses: Somewhat hard  Food Insecurity: No Food Insecurity (02/28/2024)   Hunger Vital Sign    Worried About Running Out of Food in the Last Year: Never true    Ran Out of Food in the  Last Year: Never true  Recent Concern: Food Insecurity - Food Insecurity Present (12/13/2023)   Received from [facility]   Hunger Vital Sign    Ran Out of Food in the Last Year: Sometimes true    Worried About Running Out of Food in the Last Year: Never true  Transportation Needs: No Transportation Needs (02/28/2024)   PRAPARE - Administrator, Civil Service (Medical):  No    Lack of Transportation (Non-Medical): No  Physical Activity: Not on file  Stress: Not on file  Social Connections: Moderately Integrated (02/28/2024)   Social Connection and Isolation Panel [NHANES]    Frequency of Communication with Friends and Family: Three times a week    Frequency of Social Gatherings with Friends and Family: Three times a week    Attends Religious Services: More than 4 times per year    Active Member of Clubs or Organizations: Yes    Attends Banker Meetings: More than 4 times per year    Marital Status: Widowed  Intimate Partner Violence: Not At Risk (02/28/2024)   Humiliation, Afraid, Rape, and Kick questionnaire    Fear of Current or Ex-Partner: No    Emotionally Abused: No    Physically Abused: No    Sexually Abused: No     FAMILY HISTORY:  Family History  Problem Relation Age of Onset   Valvular heart disease Mother    Diabetes Father    Cancer Sister        lung   Cancer Sister        cervical   Breast cancer Cousin       REVIEW OF SYSTEMS:  Review of Systems  Constitutional:  Negative for chills and fever.  Respiratory:  Positive for shortness of breath. Negative for cough.   Cardiovascular:  Negative for chest pain and palpitations.  Gastrointestinal:  Positive for abdominal pain, blood in stool, diarrhea, nausea and vomiting.  Genitourinary:  Negative for dysuria and urgency.  All other systems reviewed and are negative.   VITAL SIGNS:  Temp:  [97.4 F (36.3 C)-98.1 F (36.7 C)] 98.1 F (36.7 C) (04/02 0730) Pulse Rate:   [25-134] 112 (04/02 0800) Resp:  [11-29] 18 (04/02 0800) BP: (52-112)/(19-89) 107/59 (04/02 0800) SpO2:  [55 %-100 %] 94 % (04/02 0800) Weight:  [55.9 kg-61.3 kg] 61.3 kg (04/02 0515)     Height: 5\' 1"  (154.9 cm) Weight: 61.3 kg BMI (Calculated): 25.55   INTAKE/OUTPUT:  04/01 0701 - 04/02 0700 In: 5231.5 [I.V.:1692.4; IV Piggyback:3539] Out: -   PHYSICAL EXAM:  Physical Exam Vitals and nursing note reviewed. Exam conducted with a chaperone present.  Constitutional:      General: She is not in acute distress.    Comments: Patient resting in bed; friend at bedside. Appears uncomfortable. Having bloody diarrhea at time of my evaluation   HENT:     Head: Normocephalic and atraumatic.  Eyes:     General: No scleral icterus.    Conjunctiva/sclera: Conjunctivae normal.     Comments: Wearing glasses  Cardiovascular:     Rate and Rhythm: Tachycardia present.     Pulses: Normal pulses.     Heart sounds: No murmur heard. Pulmonary:     Effort: Pulmonary effort is normal. No respiratory distress.     Comments: On [place] Abdominal:     General: Abdomen is protuberant. There is distension.     Tenderness: There is generalized abdominal tenderness. There is no guarding or rebound.     Comments: Abdomen is soft but distended, she is diffusely tender, highly concerning for peritonitis   Genitourinary:    Comments: Deferred Skin:    General: Skin is warm and dry.  Neurological:     General: No focal deficit present.     Mental Status: She is alert and oriented to person, place, and time.  Psychiatric:        Mood and  Affect: Mood normal.        Behavior: Behavior normal.      Labs:     Latest Ref Rng & Units 02/28/2024    6:08 AM 02/28/2024    1:56 AM 02/27/2024    7:01 PM  CBC  WBC 4.0 - 10.5 K/uL 43.1  45.4  36.5   Hemoglobin 12.0 - 15.0 g/dL 57.8  46.9  62.9   Hematocrit 36.0 - 46.0 % 32.9  34.9  39.2   Platelets 150 - 400 K/uL 285  303  279       Latest Ref Rng & Units 02/28/2024     6:08 AM 02/28/2024    1:56 AM 02/27/2024    7:01 PM  CMP  Glucose 70 - 99 mg/dL 528  413  75   BUN 8 - 23 mg/dL 84  86  94   Creatinine 0.44 - 1.00 mg/dL 2.44  0.10  2.72   Sodium 135 - 145 mmol/L 124  123  127   Potassium 3.5 - 5.1 mmol/L 3.7  3.5  4.5   Chloride 98 - 111 mmol/L 89  90  89   CO2 22 - 32 mmol/L 9  7  7    Calcium 8.9 - 10.3 mg/dL 6.8  6.3  7.5   Total Protein 6.5 - 8.1 g/dL 5.8   6.6   Total Bilirubin 0.0 - 1.2 mg/dL 1.2   0.9   Alkaline Phos 38 - 126 U/L 109   139   AST 15 - 41 U/L 33   40   ALT 0 - 44 U/L 20   19      Imaging studies:   CT Abdomen/Pelvis (02/27/2024) personally reviewed with inflammatory changes about the entire colon, no gross free air or pneumatosis, and radiologist report reviewed below:  IMPRESSION: Diffuse colonic wall thickening and surrounding inflammation compatible with pancolitis.   Cholelithiasis.   Aortic atherosclerosis.       Assessment/Plan:  68 y.o. critically ill female requiring multiple vasopressor support for sepsis/shock likely secondary to pancolitis vs toxic megacolon and recent C diff infection, complicated by quite extensive/significant cardiac history, ARF, deconditioning.   - Given her presenting symptoms, recent C diff infection, leukocytosis, venous lactate elevation, and hemodynamic instability requiring vasopressor support, I am extremely concern for intra-abdominal source (ex: toxic megacolon, infectious colitis, ischemic colitis). I do think she needs emergent surgical intervention and likely subtotal colectomy and end ileostomy. I spent extensive time with the patient, and her firend at bedside, reviewing this and my thought process. She is at significant perioperative risk for morbidity and mortality. She understands she may need prolonged ICU stay, ventilator support, potentially multiple procedures, infection, bleeding, injury, and even death. I did discuss that the alternative would be aggressive  supportive care and medical management but that this may be a terminal event without intervention. She seemed to understand this clearly. I allowed time for questions. At the end of our discussion, patient elected to procedure with surgery understanding the significant risks associated.    - We will plan for exploratory laparotomy, possible subtotal colectomy, and end ileostomy creation with Dr Claudine Mouton as soon as OR/Anesthesia is available - All risks, benefits, and alternatives to above procedure(s) were discussed with the patient and her family, all of their questions were answered to their expressed satisfaction, patient expresses she wishes to proceed, and informed consent was obtained.   - Continue IV Abx (Zosyn, Flagyl); She is also on PO Vancomycin - Appreciate  PCCM support   All of the above findings and recommendations were discussed with the patient and her family, and all of patient's their questions were answered to their expressed satisfaction.  Thank you for the opportunity to participate in this patient's care.   Face-to-face time spent with the patient and care providers was 60 minutes, with more than 50% of the time spent counseling, educating, and coordinating care of the patient.     -- Lynden Oxford, PA-C  [facility] 02/28/2024, 10:54 AM M-F: 7am - 4pm

## 2024-02-28 NOTE — Interval H&P Note (Signed)
 History and Physical Interval Note:  02/28/2024 12:15 PM  Tammy Arias  has presented today for surgery, with the diagnosis of Toxic Megacolon.  The various methods of treatment have been discussed with the patient and family. After consideration of risks, benefits and other options for treatment, the patient has consented to  Procedure(s): LAPAROTOMY, EXPLORATORY (N/A) as a surgical intervention.  The patient's history has been reviewed, patient examined, no change in status, stable for surgery.  I have reviewed the patient's chart and labs.  Questions were answered to the patient's satisfaction.     Campbell Lerner

## 2024-02-28 NOTE — ED Notes (Addendum)
 Receiving RN Carollee Herter has agreed to accept [facility] once pt has arrived to inpatient unit, all questions and concerns address. Advised okay to send pt up and they will collect labs, labs delayed from midnight collection d/t primary RN with another critical patient, did advised lab will need to collect blood cultures as pt is a difficult stick, unable to pull cultures off central lines. Pt transfer to ICU with cardiac monitoring, and pressures still infusing at this time.

## 2024-02-28 NOTE — Progress Notes (Signed)
 ID Pt in OR for possible colectomy Chart reviewed H/o AFIB, CHF, anticoagulation, h/o cdiff in March 2025, Recurrent GI bleed Now has Cdiff, maroon stools, severe leucocytosis like leukemoid reaction and CT shows pan coloitis On high dose vanco 500mg  Q6, IV metronidazole Also on zosyn as per intensivits for covering translocation and gram neg sepsis Will see patient once she is out of OR

## 2024-02-28 NOTE — Consult Note (Signed)
 NAME: Tammy Arias  DOB: 1956-04-12  MRN: [id number]  Date/Time: 02/28/2024 10:52 AM  REQUESTING PROVIDER: Dr. Aundria Rud Subjective:  REASON FOR CONSULT: cdiff colitis ?pt is intubated- no history available Tammy Arias is a 68 y.o. female with a H/o AFIB, CHF, anticoagulation, h/o cdiff in March 2025, Recurrent GI bleed for which she was hospitalized many times , CKD recent hopsitalization at [facility] 2/28-3/18 bicytopenia ( neutropenia and thrombocytopenia), CHF,   presented to the ED on 02/27/24 with vomiting and diarrhea for 3-4 days with weakness. Tested positive for cdiff on 01/30/24 and got PO vanco as per ICU note for 10 days. She was at her baseline health until 3/22 when she started with diarrhea and thought it was IBS , however it worsened and also she started having vomiting and noted blood in her stool and diffuse abdominal pain and came to the ED  Vitals in the ED 58/35, pulse 89, Temp 97.4 and RR 17, temp 97.4  Latest Reference Range & Units 02/27/24 19:01  WBC 4.0 - 10.5 K/uL 36.5 (H)  Hemoglobin 12.0 - 15.0 g/dL 40.1  HCT 02.7 - 25.3 % 39.2  Platelets 150 - 400 K/uL 279  Creatinine 0.44 - 1.00 mg/dL 6.64 (H)  Blood culture sent Cdiff came positive CT abdomen pan colitis Lactate was 8.3 She was started on High dose PO vanco and flagyl and Iv zosyn and IV vanco ( which was later stopped) She was seen by surgeon and taken for total colectomy with end ileostomy  Past Medical History:  Diagnosis Date   AC (acromioclavicular) joint bone spurs, right    Anemia    Arthritis    Atrial fibrillation (HCC)    a.) CHA2DS2-VASc = 7 (age, sex, HFrEF, HTN, TIA x 2, T2DM). b.) rate/rhythm controlled on oral carvedilol; chronically anticoagulated with warfarin.   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 b.) R/LHC 03/29/2017: small RCA  with occluded SVG; no significant Dz. Chronically occluded LAD with patent SVG to D1/LAD. Insignificant Dz in LCx. LM normal.   Cortical cataract    DOE (dyspnea on exertion)    DOE (dyspnea on exertion)    GERD (gastroesophageal reflux disease)    HFrEF (heart failure with reduced ejection fraction) (HCC)    a.) TTE 10/27/2014: EF 40%; glob HK; mild BAE; triv AR, mild MR/PR, mod TR.  b.) TTE 03/15/2017: EF 30%; glob HK; mod BAE; mod pHTN (RVSP 54.8 mmHg); mild PR, mod MR; sev TR.  c.) R/LHC 03/29/2017: EF 30-35%. d.)TTE 10/25/2018: EF 35%; mod BAE; mod BVE; glob HK; triv PR, mod MR, sev TR; RVSP 57.7 mmHg; G2DD.   History of shingles 2004   Hyperlipidemia    Hypertension    Hypothyroidism    IBS (irritable bowel syndrome)    Ischemic cardiomyopathy    a.) TTE 10/27/2014: EF 40%. b.) TTE 03/15/2017: EF 30%. c.) R/LHC 04/01/2017: EF 30-35%. d.) TTE 10/25/2018: EF 35%   Lumbar scoliosis    Lumbar spinal stenosis    Migraines    Osteoporosis    Pulmonary HTN (HCC)    a.) TTE 03/15/2017: EF 30-35%; RVSP 54.8 mmHg. b.) R/LHC 03/29/2017: mean PA 33 mmHg, PCWP 22 mmHg, LVEDP 14 mmHg, mean AO 79 mmHg; CO 7.89 L/min, CI 4.61 L/min/m   S/P CABG  x 3 01/28/2004   Sleep apnea    T2DM (type 2 diabetes mellitus) (HCC)    TIA (transient ischemic attack) 05/29/2016   Vitamin B 12 deficiency     Past Surgical History:  Procedure Laterality Date   CARDIAC CATHETERIZATION     CATARACT EXTRACTION W/ INTRAOCULAR LENS IMPLANT Bilateral    Cataract Extraction with IOL   COLONOSCOPY N/A 12/19/2018   Procedure: COLONOSCOPY;  Surgeon: Pasty Spillers, MD;  Location: [facility] ENDOSCOPY;  Service: Endoscopy;  Laterality: N/A;   COLONOSCOPY WITH PROPOFOL N/A 06/17/2020   Procedure: COLONOSCOPY WITH PROPOFOL;  Surgeon: Pasty Spillers, MD;  Location: [facility] ENDOSCOPY;  Service: Endoscopy;  Laterality: N/A;   COLONOSCOPY WITH PROPOFOL N/A 03/30/2023   Procedure: COLONOSCOPY WITH PROPOFOL;  Surgeon: Jaynie Collins, DO;  Location: [facility] ENDOSCOPY;  Service: Endoscopy;  Laterality: N/A;   COR TRIATRIATUM REPAIR N/A 01/28/2004   CORONARY ARTERY BYPASS GRAFT N/A 01/28/2004   3v CABG   ESOPHAGOGASTRODUODENOSCOPY N/A 12/19/2018   Procedure: ESOPHAGOGASTRODUODENOSCOPY (EGD);  Surgeon: Pasty Spillers, MD;  Location: [facility] ENDOSCOPY;  Service: Endoscopy;  Laterality: N/A;   ESOPHAGOGASTRODUODENOSCOPY (EGD) WITH PROPOFOL N/A 06/17/2020   Procedure: ESOPHAGOGASTRODUODENOSCOPY (EGD) WITH PROPOFOL;  Surgeon: Pasty Spillers, MD;  Location: [facility] ENDOSCOPY;  Service: Endoscopy;  Laterality: N/A;   ESOPHAGOGASTRODUODENOSCOPY (EGD) WITH PROPOFOL N/A 03/30/2023   Procedure: ESOPHAGOGASTRODUODENOSCOPY (EGD) WITH PROPOFOL;  Surgeon: Jaynie Collins, DO;  Location: [facility] ENDOSCOPY;  Service: Endoscopy;  Laterality: N/A;   RIGHT/LEFT HEART CATH AND CORONARY ANGIOGRAPHY Bilateral 03/29/2017   Procedure: Right/Left Heart Cath and Coronary Angiography;  Surgeon: Dalia Heading, MD;  Location: [facility] INVASIVE CV LAB;  Service: Cardiovascular;  Laterality: Bilateral;   TOTAL KNEE ARTHROPLASTY Right 04/05/2022   Procedure: TOTAL KNEE ARTHROPLASTY;  Surgeon: Kennedy Bucker, MD;  Location: [facility] ORS;  Service: Orthopedics;  Laterality: Right;   TUBAL LIGATION     VENTRAL HERNIA REPAIR N/A 04/21/2017   Procedure: HERNIA REPAIR VENTRAL ADULT;  Surgeon: Nadeen Landau, MD;  Location: [facility] ORS;  Service: General;  Laterality: N/A;    Social History   Socioeconomic History   Marital status: Widowed    Spouse name: Not on file   Number of children: Not on file   Years of education: Not on file   Highest education level: Not on file  Occupational History   Occupation: certified nursing assistant  Tobacco Use   Smoking status: Former    Current packs/day: 0.00    Types: Cigarettes    Quit date: 03/30/1999    Years since quitting: 24.9   Smokeless tobacco: Never  Vaping Use   Vaping status: Never Used   Substance and Sexual Activity   Alcohol use: No   Drug use: No   Sexual activity: Not Currently  Other Topics Concern   Not on file  Social History Narrative   Not on file   Social Drivers of Health   Financial Resource Strain: Low Risk  (02/11/2024)   Received from [facility]   Overall Financial Resource Strain (CARDIA)    Difficulty of Paying Living Expenses: Not very hard  Recent Concern: Financial Resource Strain - Medium Risk (12/13/2023)   Received from [facility]   Overall Financial Resource Strain (CARDIA)    Difficulty of Paying Living Expenses: Somewhat hard  Food Insecurity: No Food Insecurity (02/28/2024)   Hunger Vital Sign    Worried About Running Out of Food in the Last Year: Never true  Ran Out of Food in the Last Year: Never true  Recent Concern: Food Insecurity - Food Insecurity Present (12/13/2023)   Received from [facility]   Hunger Vital Sign    Ran Out of Food in the Last Year: Sometimes true    Worried About Running Out of Food in the Last Year: Never true  Transportation Needs: No Transportation Needs (02/28/2024)   PRAPARE - Administrator, Civil Service (Medical): No    Lack of Transportation (Non-Medical): No  Physical Activity: Not on file  Stress: Not on file  Social Connections: Moderately Integrated (02/28/2024)   Social Connection and Isolation Panel [NHANES]    Frequency of Communication with Friends and Family: Three times a week    Frequency of Social Gatherings with Friends and Family: Three times a week    Attends Religious Services: More than 4 times per year    Active Member of Clubs or Organizations: Yes    Attends Banker Meetings: More than 4 times per year    Marital Status: Widowed  Intimate Partner Violence: Not At Risk (02/28/2024)   Humiliation, Afraid, Rape, and Kick questionnaire    Fear of Current or Ex-Partner: No    Emotionally Abused: No     Physically Abused: No    Sexually Abused: No    Family History  Problem Relation Age of Onset   Valvular heart disease Mother    Diabetes Father    Cancer Sister        lung   Cancer Sister        cervical   Breast cancer Cousin    Allergies  Allergen Reactions   Vioxx [Rofecoxib] Swelling   I? Current Facility-Administered Medications  Medication Dose Route Frequency Provider Last Rate Last Admin   0.9 %  sodium chloride infusion (Manually program via Guardrails IV Fluids)   Intravenous Once Rust-Chester, Cecelia Byars, NP       acetaminophen (TYLENOL) tablet 650 mg  650 mg Oral Q6H PRN Rust-Chester, Britton L, NP       albuterol (PROVENTIL) (2.5 MG/3ML) 0.083% nebulizer solution 2.5 mg  2.5 mg Nebulization Q4H PRN Rust-Chester, Cecelia Byars, NP       Chlorhexidine Gluconate Cloth 2 % PADS 6 each  6 each Topical Daily Dgayli, Lianne Bushy, MD   6 each at 02/28/24 0141   docusate sodium (COLACE) capsule 100 mg  100 mg Oral BID PRN Rust-Chester, Micheline Rough L, NP       insulin aspart (novoLOG) injection 0-9 Units  0-9 Units Subcutaneous Q4H Rust-Chester, Britton L, NP   3 Units at 02/28/24 0927   latanoprost (XALATAN) 0.005 % ophthalmic solution 1 drop  1 drop Both Eyes QHS Rust-Chester, Britton L, NP   1 drop at 02/27/24 2149   levothyroxine (SYNTHROID) tablet 50 mcg  50 mcg Oral Q0600 Rust-Chester, Britton L, NP   50 mcg at 02/28/24 0605   lidocaine (LIDODERM) 5 % 1 patch  1 patch Transdermal Q0600 Rust-Chester, Britton L, NP   1 patch at 02/28/24 0456   metroNIDAZOLE (FLAGYL) IVPB 500 mg  500 mg Intravenous Q8H Rust-Chester, Britton L, NP   Stopped at 02/28/24 0304   norepinephrine (LEVOPHED) 16 mg in (0.064 mg/mL) premix infusion  0-40 mcg/min Intravenous Titrated Rust-Chester, Britton L, NP 15 mL/hr at 02/28/24 0700 16 mcg/min at 02/28/24 0700   Oral care mouth rinse  15 mL Mouth Rinse PRN Rust-Chester, Cecelia Byars, NP  pantoprazole (PROTONIX) injection 40 mg  40 mg Intravenous Q12H  Rust-Chester, Britton L, NP   40 mg at 02/28/24 0928   piperacillin-tazobactam (ZOSYN) IVPB 2.25 g  2.25 g Intravenous Q8H Burnis Medin D, RPH       polyethylene glycol (MIRALAX / GLYCOLAX) packet 17 g  17 g Oral Daily PRN Rust-Chester, Micheline Rough L, NP       sodium bicarbonate 150 mEq in sterile water 1,150 mL infusion   Intravenous Continuous Rust-Chester, Cecelia Byars, NP 125 mL/hr at 02/28/24 0829 New Bag at 02/28/24 0829   vancomycin (VANCOCIN) capsule 500 mg  500 mg Oral Q6H Belue, Lendon Collar, RPH       vasopressin (PITRESSIN) 20 Units in 100 mL (0.2 unit/mL) infusion-*FOR SHOCK*  0-0.04 Units/min Intravenous Continuous Rust-Chester, Britton L, NP 12 mL/hr at 02/28/24 0700 0.04 Units/min at 02/28/24 0700     Abtx:  Anti-infectives (From admission, onward)    Start     Dose/Rate Route Frequency Ordered Stop   02/28/24 1200  piperacillin-tazobactam (ZOSYN) IVPB 2.25 g        2.25 g 100 mL/hr over 30 Minutes Intravenous Every 8 hours 02/28/24 1013     02/28/24 1030  vancomycin (VANCOCIN) capsule 500 mg        500 mg Oral Every 6 hours 02/28/24 0247 03/13/24 0559   02/28/24 0345  vancomycin (VANCOCIN) capsule 500 mg        500 mg Oral  Once 02/28/24 0249 02/28/24 0323   02/28/24 0000  vancomycin (VANCOCIN) capsule 500 mg  Status:  Discontinued        500 mg Oral Every 6 hours 02/27/24 2356 02/28/24 0247   02/28/24 0000  metroNIDAZOLE (FLAGYL) IVPB 500 mg        500 mg 100 mL/hr over 60 Minutes Intravenous Every 8 hours 02/27/24 2356 03/13/24 0159   02/27/24 1930  ceFEPIme (MAXIPIME) 2 g in sodium chloride 0.9 % 100 mL IVPB        2 g 200 mL/hr over 30 Minutes Intravenous  Once 02/27/24 1925 02/27/24 2030   02/27/24 1930  metroNIDAZOLE (FLAGYL) IVPB 500 mg        500 mg 100 mL/hr over 60 Minutes Intravenous  Once 02/27/24 1925 02/27/24 2057   02/27/24 1930  vancomycin (VANCOCIN) IVPB 1000 mg/200 mL premix        1,000 mg 200 mL/hr over 60 Minutes Intravenous  Once 02/27/24 1925 02/27/24  2107       REVIEW OF SYSTEMS:  NA Objective:  VITALS:  BP (!) 107/59   Pulse (!) 112   Temp 98.1 F (36.7 C)   Resp 18   Ht 5\' 1"  (1.549 m)   Wt 61.3 kg   SpO2 94%   BMI 25.53 kg/m  LDA Foley Central line Other drainage tubes PHYSICAL EXAM:  General: intubated sedated, pale Rt internal jugular Rt radial foley Head: Normocephalic, without obvious abnormality, atraumatic. Eyes: Conjunctivae clear, anicteric sclerae. Pupils are equal ENT cannot examine Neck:  symmetrical, no adenopathy, thyroid: non tender  Lungs: b/l air entry Heart: irregular Abdomen: Soft, lap site covered with dresisng- ileostomy stoma  Extremities: atraumatic, no cyanosis. No edema. No clubbing Skin: No rashes or lesions. Or bruising Lymph: Cervical, supraclavicular normal. Neurologic: cannot assess Pertinent Labs Lab Results CBC    Component Value Date/Time   WBC 43.1 (H) 02/28/2024 0608   RBC 3.47 (L) 02/28/2024 0608   HGB 11.0 (L) 02/28/2024 0608   HGB 12.4 02/23/2024 1446  HCT 32.9 (L) 02/28/2024 0608   PLT 285 02/28/2024 0608   PLT 145 (L) 02/23/2024 1446   MCV 94.8 02/28/2024 0608   MCH 31.7 02/28/2024 0608   MCHC 33.4 02/28/2024 0608   RDW 17.2 (H) 02/28/2024 0608   LYMPHSABS 0.7 02/27/2024 1901   MONOABS 1.5 (H) 02/27/2024 1901   EOSABS 0.0 02/27/2024 1901   BASOSABS 0.0 02/27/2024 1901       Latest Ref Rng & Units 02/28/2024    6:08 AM 02/28/2024    1:56 AM 02/27/2024    7:01 PM  CMP  Glucose 70 - 99 mg/dL 161  096  75   BUN 8 - 23 mg/dL 84  86  94   Creatinine 0.44 - 1.00 mg/dL 0.45  4.09  8.11   Sodium 135 - 145 mmol/L 124  123  127   Potassium 3.5 - 5.1 mmol/L 3.7  3.5  4.5   Chloride 98 - 111 mmol/L 89  90  89   CO2 22 - 32 mmol/L 9  7  7    Calcium 8.9 - 10.3 mg/dL 6.8  6.3  7.5   Total Protein 6.5 - 8.1 g/dL 5.8   6.6   Total Bilirubin 0.0 - 1.2 mg/dL 1.2   0.9   Alkaline Phos 38 - 126 U/L 109   139   AST 15 - 41 U/L 33   40   ALT 0 - 44 U/L 20   19        Microbiology: Recent Results (from the past 240 hours)  C Difficile Quick Screen w PCR reflex     Status: Abnormal   Collection Time: 02/27/24  7:34 PM   Specimen: STOOL  Result Value Ref Range Status   C Diff antigen POSITIVE (A) NEGATIVE Final   C Diff toxin POSITIVE (A) NEGATIVE Final   C Diff interpretation Toxin producing C. difficile detected.  Final    Comment: CRITICAL RESULT CALLED TO, READ BACK BY AND VERIFIED WITH: AMANDA FOX RN @2254  02/27/24 ASW Performed at [facility], [address] [place], [place] [address]   Gastrointestinal Panel by PCR , Stool     Status: None   Collection Time: 02/27/24  7:34 PM   Specimen: STOOL  Result Value Ref Range Status   Campylobacter species NOT DETECTED NOT DETECTED Final   Plesimonas shigelloides NOT DETECTED NOT DETECTED Final   Salmonella species NOT DETECTED NOT DETECTED Final   Yersinia enterocolitica NOT DETECTED NOT DETECTED Final   Vibrio species NOT DETECTED NOT DETECTED Final   Vibrio cholerae NOT DETECTED NOT DETECTED Final   Enteroaggregative E coli (EAEC) NOT DETECTED NOT DETECTED Final   Enteropathogenic E coli (EPEC) NOT DETECTED NOT DETECTED Final   Enterotoxigenic E coli (ETEC) NOT DETECTED NOT DETECTED Final   Shiga like toxin producing E coli (STEC) NOT DETECTED NOT DETECTED Final   Shigella/Enteroinvasive E coli (EIEC) NOT DETECTED NOT DETECTED Final   Cryptosporidium NOT DETECTED NOT DETECTED Final   Cyclospora cayetanensis NOT DETECTED NOT DETECTED Final   Entamoeba histolytica NOT DETECTED NOT DETECTED Final   Giardia lamblia NOT DETECTED NOT DETECTED Final   Adenovirus F40/41 NOT DETECTED NOT DETECTED Final   Astrovirus NOT DETECTED NOT DETECTED Final   Norovirus GI/GII NOT DETECTED NOT DETECTED Final   Rotavirus A NOT DETECTED NOT DETECTED Final   Sapovirus (I, II, IV, and V) NOT DETECTED NOT DETECTED Final    Comment: Performed at [facility], [address]  [place], [place] [address]  MRSA Next Gen by PCR, Nasal     Status: None   Collection Time: 02/27/24 10:01 PM   Specimen: Peripheral; Nasal Swab  Result Value Ref Range Status   MRSA by PCR Next Gen NOT DETECTED NOT DETECTED Final    Comment: (NOTE) The GeneXpert MRSA Assay (FDA approved for NASAL specimens only), is one component of a comprehensive MRSA colonization surveillance program. It is not intended to diagnose MRSA infection nor to guide or monitor treatment for MRSA infections. Test performance is not FDA approved in patients less than 54 years old. Performed at [facility], [address] [place], [place] [address]   Culture, blood (Routine X 2) w Reflex to ID Panel     Status: None (Preliminary result)   Collection Time: 02/28/24  2:17 AM   Specimen: BLOOD LEFT HAND  Result Value Ref Range Status   Specimen Description BLOOD LEFT HAND  Final   Special Requests   Final    BOTTLES DRAWN AEROBIC AND ANAEROBIC Blood Culture results may not be optimal due to an inadequate volume of blood received in culture bottles   Culture   Final    NO GROWTH < 12 HOURS Performed at [facility], [address], [place], [place] [address]    Report Status PENDING  Incomplete  Culture, blood (Routine X 2) w Reflex to ID Panel     Status: None (Preliminary result)   Collection Time: 02/28/24  2:17 AM   Specimen: BLOOD LEFT ARM  Result Value Ref Range Status   Specimen Description BLOOD LEFT ARM  Final   Special Requests   Final    BOTTLES DRAWN AEROBIC AND ANAEROBIC Blood Culture results may not be optimal due to an inadequate volume of blood received in culture bottles   Culture   Final    NO GROWTH < 12 HOURS Performed at [facility], [address], [place], [place] [address]    Report Status PENDING  Incomplete    IMAGING RESULTS: CT abdomen Pan colitis  ? Impression/Recommendation ?Fulminant cdiff colitis with septic shock with multi organ failure  and leukemoid reaction ? 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 Anemia   ________________________________________________ Discussed with care team

## 2024-02-28 NOTE — Progress Notes (Signed)
 NAME:  Tammy Arias, MRN:  [id number], DOB:  [birth year], LOS: 1 ADMISSION DATE:  02/27/2024, CHIEF COMPLAINT:  Septic Shock   History of Present Illness:   68 yo F presenting to [facility] ED from home for evaluation of diarrhea and vomiting since 02/25/24.   History obtained per chart review and patient bedside report. Patient has had multiple hospitalizations since January 2025 with GI bleeding s/p clipping, watchman device placement and most recently acute CHF exacerbation from 01/26/24- 02/13/24.  During her most recent admission, she required loop diuretics as well as inotrope assistance for adequate diuresis. She was changed from entresto to valsartan due to hypotension and her BB was stopped. She was continued on Eliquis post watchman device per EP recommendations after 6 week TEE on 01/23/24 noted a small flow jet from the LAA back into the LA proper at the edge of the inferior aspect of the occluder device.  She also tested positive for C.Diff completing a 10 day course of PO vancomycin for treatment on 02/10/24.   After discharge she was in her normal state of health until noticing recurrent diarrhea on 02/17/24, which she thought might just be an IBS flare. Diarrhea however worsened on 02/21/24 which got much worse on 3/28 with the addition of persistent non-bloody bilious emesis. Her best friend, who has been helping care for her stated she noticed some blood in her stool that seemed to be from the patient's hemorrhoids. However this bleeding significantly increased on 02/27/24 and the patient has had at least 2 bloody bowel movements. The patient also endorses generalized weakness and fatigue, diffuse abdominal tenderness, blurred vision with green spots. She denies chest pain, dyspnea, urinary symptoms, fever/chills, LOC or palpitations. She did fall about 3 days ago, landing on her buttocks, she denies hitting her head.   She reports continuing to take all her prescribed medications including her  valsartan, torsemide and Eliquis.   ED course: Upon arrival patient significantly hypotensive and drowsy. Sepsis protocol initiated and due to sever hypotension, central venous catheter placed and vasopressor started. Labs significant for hyponatremia, hypochloremia, AGMA, AKI on CKD, elevated Alk Phos, leukocytosis and significant lactic acidosis with fecal occult positive. Small amount of hematochezia noted in ED. Imaging significant for pan colitis without signs of toxic megacolon and cholelithiasis without cholecystis. Medications given: cefepime/vancomycin/flagyl, levophed drip, 2.75 L IVF bolus Initial Vitals: 97.4, 15, 90, 62/35 & 100% on RA  C.Diff testing: Positive   CXR 02/27/24: no acute cardiopulmonary disease CT abdomen/pelvis wo contrast 02/27/24: Diffuse colonic wall thickening and surrounding inflammation compatible with pancolitis. Cholelithiasis.   PCCM consulted for admission due to severe sepsis with shock secondary to recurrent C.Diff colitis requiring vasopressor support.  Pertinent  Medical History   CAD s/p CABG & ASD repair (2005) HFrEF (LVEF 30%) ICM Pulmonary HTN Heart Murmur Atrial Fibrillation s/p Watchman device (11/2023) Hypothyroidism HLD CKD stage 3b (baseline Cr 1.0) HTN Iron deficiency Anemia Angiodysplasia of the intestinal tract Diverticulosis GI bleeding due to AVM s/p clipping (11/2023) OSA on CPAP T2DM TIA Gout Former Smoker (20+ pack history) Severe MR/TR Pancytopenia  Significant Hospital Events: Including procedures, antibiotic start and stop dates in addition to other pertinent events   02/27/24: Admit to ICU with severe sepsis and shock secondary to recurrent C.Diff colitis requiring vasopressor support. 02/28/24: on two pressors, rising lactic acidosis, surgery consulted, plan for colectomy.   Interim History / Subjective:  Patient alert and conversant at the bedside today.  Reports abdominal pain but improved  shortness of breath.    Objective   Blood pressure 118/64, pulse (!) 116, temperature 97.6 F (36.4 C), resp. rate 20, height 5\' 1"  (1.549 m), weight 61.3 kg, SpO2 93%.        Intake/Output Summary (Last 24 hours) at 02/28/2024 1329 Last data filed at 02/28/2024 1252 Gross per 24 hour  Intake 5481.46 ml  Output 10 ml  Net 5471.46 ml   Filed Weights   02/27/24 1830 02/28/24 0145 02/28/24 0515  Weight: 55.9 kg 61.1 kg 61.3 kg    Examination: Physical Exam    Assessment & Plan:   Neurology #Abdominal Pain  Pain control with Tylenol and lidocaine patch.  She will be going to the operating room for colectomy and would likely need narcotics for postoperative pain control.  Cardiovascular #Septic shock #Heart failure with reduced ejection fraction #Atrial fibrillation  #s/p Watchman procedure (11/2023) #CAD status post CABG   Recently admitted to [facility] 2/28 - 3/18 with decompensated heart failure and significant volume overload.  Right heart cath showed RA 26, PA 60/22 (40), PCWP 30, CI 2.0, PVR 2.9 and aggressively diuresed.  Repeat right heart cath on 3/12 showed elevated pressures but the results are not available. Her echocardiography showing severe mitral regurgitation and tricuspid regurgitation with plan for outpatient MitraClip and tri-clip placement.  She was discharged on goal-directed medical therapy presents now with hypotension.  Patient with overall presentation that is consistent with septic shock secondary to C. difficile colitis with ischemic colitis also possible.  She has had rising lactic acidosis indicative of poor perfusion and is currently requiring vasopressor support with norepinephrine and vasopressin.  She has been volume resuscitated but this is limited by her heart failure.  -continue nor-epi and vasopressin, goal MAP > 65 -trend lactic acid -will need heart failure consult -hold GDMT  Pulmonary  Metabolic acidosis that is currently compensated and without signs of  hypoxemia.  Plan is to go to the operating room for laparotomy she might likely require postoperative mechanical ventilation.  Gastrointestinal # Severe C. difficile colitis #Hematochezia #Diverticulosis #Colonic AVM  Presents with hematochezia and recurrent diarrhea since Friday.  Patient recently admitted to [facility] where she developed C. difficile colitis treated with oral vancomycin. She's also been admitted to [facility] in January with GI bleed where she was found to have AVM's as well as diverticulosis.  Comes in now with worsening diarrhea and shock with imaging notable for diffuse pancolitis, abdominal pain, and concern for peritonitis.  She has been seen by general surgery with plan for exploratory laparotomy with potential colectomy for source control.  Also the differential for her presentation is ischemic colitis secondary to a cardioembolic process given her history of A-fib.  -surgery consult -NPO -continue PPI  Renal #AKI #Metabolic acidosis  Presents with severe kidney injury in the setting of shock as well as continued use of her outpatient GDMT with direct nephrotoxicity as well as pre-renal azotemia and likely ATN.  We have held nephrotoxins and have initiated a sodium bicarbonate infusion with stabilization of her acid-base status. She will likely need nephrology consult and consideration for renal replacement therapy should her kidney function not improve.  Endocrine  ICU glycemic protocol. Will consider hydrocortisone should her shock persist.  Hem/Onc #Afib on Eliquis #Acute Blood loss anemia  Presents with hematochezia due to colitis. Was on Apixaban for afib as well as for continued anti-coagulation post watchman placement. Will administer FFP's prior to surgery today.  ID # Septic shock #Severe C. difficile  colitis  Presents with severe C. difficile colitis with hematochezia and bloody diarrhea.  Previously treated with oral vancomycin during her recent  hospitalization at [facility].  Given hemodynamic instability, started on oral vancomycin (500 mg every 6 hours) as well as IV metronidazole.  I have also started her on broad-spectrum gram-negative coverage given concern for peritonitis concomitant septic shock. Patient to go for exploratory laparotomy for source control with general surgery.    Best Practice (right click and "Reselect all SmartList Selections" daily)   Diet/type: NPO DVT prophylaxis other; active bleed, surgery, held Pressure ulcer(s): N/A GI prophylaxis: PPI Lines: Central line and Arterial Line Foley:  Yes, and it is still needed Code Status:  full code Last date of multidisciplinary goals of care discussion [02/28/2024]  Labs   CBC: Recent Labs  Lab 02/23/24 1446 02/27/24 1901 02/28/24 0156 02/28/24 0608  WBC 7.0 36.5* 45.4* 43.1*  NEUTROABS 5.7 33.3*  --   --   HGB 12.4 12.6 11.3* 11.0*  HCT 37.6 39.2 34.9* 32.9*  MCV 95.7 96.3 98.9 94.8  PLT 145* 279 303 285    Basic Metabolic Panel: Recent Labs  Lab 02/27/24 1901 02/28/24 0156 02/28/24 0608 02/28/24 1026  NA 127* 123* 124* 130*  K 4.5 3.5 3.7 3.3*  CL 89* 90* 89* 92*  CO2 7* 7* 9* 10*  GLUCOSE 75 224* 228* 130*  BUN 94* 86* 84* 84*  CREATININE 7.04* 6.15* 6.17* 6.19*  CALCIUM 7.5* 6.3* 6.8* 6.9*  MG  --  1.5* 1.6*  --   PHOS  --  8.2* 7.9*  --    GFR: Estimated Creatinine Clearance: 7.2 mL/min (A) (by C-G formula based on SCr of 6.19 mg/dL (H)). Recent Labs  Lab 02/23/24 1446 02/27/24 1901 02/27/24 1919 02/28/24 0156 02/28/24 0608 02/28/24 0920  PROCALCITON  --   --   --  41.08 38.89  --   WBC 7.0 36.5*  --  45.4* 43.1*  --   LATICACIDVEN  --   --  8.3* 4.6* 6.4* 7.9*    Liver Function Tests: Recent Labs  Lab 02/27/24 1901 02/28/24 0608  AST 40 33  ALT 19 20  ALKPHOS 139* 109  BILITOT 0.9 1.2  PROT 6.6 5.8*  ALBUMIN 3.1* 2.8*   Recent Labs  Lab 02/27/24 1901  LIPASE 25   No results for input(s): "AMMONIA" in the last  168 hours.  ABG    Component Value Date/Time   HCO3 7.0 (L) 02/28/2024 1009   ACIDBASEDEF 18.4 (H) 02/28/2024 1009   O2SAT 78.6 02/28/2024 1009     Coagulation Profile: Recent Labs  Lab 02/28/24 0156  INR 5.0*    Cardiac Enzymes: Recent Labs  Lab 02/28/24 0156  CKTOTAL 53    HbA1C: Hgb A1c MFr Bld  Date/Time Value Ref Range Status  02/28/2024 06:08 AM 5.9 (H) 4.8 - 5.6 % Final    Comment:    (NOTE) Pre diabetes:          5.7%-6.4%  Diabetes:              >6.4%  Glycemic control for   <7.0% adults with diabetes   06/07/2023 02:58 AM 6.3 (H) 4.8 - 5.6 % Final    Comment:    (NOTE)         Prediabetes: 5.7 - 6.4         Diabetes: >6.4         Glycemic control for adults with diabetes: <7.0  CBG: Recent Labs  Lab 02/28/24 0141 02/28/24 0332 02/28/24 0749  GLUCAP 177* 196* 203*    Review of Systems:   N/A  Past Medical History:  She,  has a past medical history of AC (acromioclavicular) joint bone spurs, right, Anemia, Arthritis, Atrial fibrillation (HCC), Cardiac murmur, CHF (congestive heart failure) (HCC), Chronic anticoagulation, Cor triatriatum, Coronary artery disease, Cortical cataract, DOE (dyspnea on exertion), DOE (dyspnea on exertion), GERD (gastroesophageal reflux disease), HFrEF (heart failure with reduced ejection fraction) (HCC), History of shingles (2004), Hyperlipidemia, Hypertension, Hypothyroidism, IBS (irritable bowel syndrome), Ischemic cardiomyopathy, Lumbar scoliosis, Lumbar spinal stenosis, Migraines, Osteoporosis, Pulmonary HTN (HCC), S/P CABG x 3 (01/28/2004), Sleep apnea, T2DM (type 2 diabetes mellitus) (HCC), TIA (transient ischemic attack) (05/29/2016), and Vitamin B 12 deficiency.   Surgical History:   Past Surgical History:  Procedure Laterality Date   CARDIAC CATHETERIZATION     CATARACT EXTRACTION W/ INTRAOCULAR LENS IMPLANT Bilateral    Cataract Extraction with IOL   COLONOSCOPY N/A 12/19/2018   Procedure:  COLONOSCOPY;  Surgeon: Pasty Spillers, MD;  Location: [facility] ENDOSCOPY;  Service: Endoscopy;  Laterality: N/A;   COLONOSCOPY WITH PROPOFOL N/A 06/17/2020   Procedure: COLONOSCOPY WITH PROPOFOL;  Surgeon: Pasty Spillers, MD;  Location: [facility] ENDOSCOPY;  Service: Endoscopy;  Laterality: N/A;   COLONOSCOPY WITH PROPOFOL N/A 03/30/2023   Procedure: COLONOSCOPY WITH PROPOFOL;  Surgeon: Jaynie Collins, DO;  [facility] ENDOSCOPY;  Service: Endoscopy;  Laterality: N/A;   COR TRIATRIATUM REPAIR N/A 01/28/2004   CORONARY ARTERY BYPASS GRAFT N/A 01/28/2004   3v CABG   ESOPHAGOGASTRODUODENOSCOPY N/A 12/19/2018   Procedure: ESOPHAGOGASTRODUODENOSCOPY (EGD);  Surgeon: Pasty Spillers, MD;  Location: [facility] ENDOSCOPY;  Service: Endoscopy;  Laterality: N/A;   ESOPHAGOGASTRODUODENOSCOPY (EGD) WITH PROPOFOL N/A 06/17/2020   Procedure: ESOPHAGOGASTRODUODENOSCOPY (EGD) WITH PROPOFOL;  Surgeon: Pasty Spillers, MD;  Location: [facility] ENDOSCOPY;  Service: Endoscopy;  Laterality: N/A;   ESOPHAGOGASTRODUODENOSCOPY (EGD) WITH PROPOFOL N/A 03/30/2023   Procedure: ESOPHAGOGASTRODUODENOSCOPY (EGD) WITH PROPOFOL;  Surgeon: Jaynie Collins, DO;  Location: [facility] ENDOSCOPY;  Service: Endoscopy;  Laterality: N/A;   RIGHT/LEFT HEART CATH AND CORONARY ANGIOGRAPHY Bilateral 03/29/2017   Procedure: Right/Left Heart Cath and Coronary Angiography;  Surgeon: Dalia Heading, MD;  Location: [facility] INVASIVE CV LAB;  Service: Cardiovascular;  Laterality: Bilateral;   TOTAL KNEE ARTHROPLASTY Right 04/05/2022   Procedure: TOTAL KNEE ARTHROPLASTY;  Surgeon: Kennedy Bucker, MD;  Location: [facility] ORS;  Service: Orthopedics;  Laterality: Right;   TUBAL LIGATION     VENTRAL HERNIA REPAIR N/A 04/21/2017   Procedure: HERNIA REPAIR VENTRAL ADULT;  Surgeon: Nadeen Landau, MD;  Location: [facility] ORS;  Service: General;  Laterality: N/A;     Social History:   reports that she quit smoking about 24 years ago. Her smoking use  included cigarettes. She has never used smokeless tobacco. She reports that she does not drink alcohol and does not use drugs.   Family History:  Her family history includes Breast cancer in her cousin; Cancer in her sister and sister; Diabetes in her father; Valvular heart disease in her mother.   Allergies Allergies  Allergen Reactions   Vioxx [Rofecoxib] Swelling     Home Medications  Prior to Admission medications   Medication Sig Start Date End Date Taking? Authorizing Provider  albuterol (PROVENTIL HFA;VENTOLIN HFA) 108 (90 Base) MCG/ACT inhaler Inhale 2 puffs into the lungs every 6 (six) hours as needed for wheezing or shortness of breath.   Yes [provider]  allopurinol (  ZYLOPRIM) 300 MG tablet Take 300 mg by mouth daily.   Yes [provider]  colchicine 0.6 MG tablet Take 0.6-1.8 mg by mouth as needed (take 1.2 mg (2 tablets) at onset of gout flare, Take one additinaltablet one hour later if symptoms persist. Maximum of 3 tablets per day).   Yes [provider]  ELIQUIS 5 MG TABS tablet Take 5 mg by mouth 2 (two) times daily.   Yes [provider]  JARDIANCE 10 MG TABS tablet Take 10 mg by mouth daily.   Yes [provider]  latanoprost (XALATAN) 0.005 % ophthalmic solution Place 1 drop into both eyes at bedtime.   Yes [provider]  levothyroxine (SYNTHROID) 50 MCG tablet Take 50 mcg by mouth daily before breakfast.   Yes [provider]  metFORMIN (GLUCOPHAGE) 1000 MG tablet Take 1,000 mg by mouth 2 (two) times daily with a meal.   Yes [provider]  pantoprazole (PROTONIX) 40 MG tablet Take 40 mg by mouth daily. 01/28/21  Yes [provider]  rosuvastatin (CRESTOR) 5 MG tablet Take 5 mg by mouth daily. 02/20/24 02/19/25 Yes [provider]  spironolactone (ALDACTONE) 25 MG tablet Take 25 mg by mouth daily.   Yes [provider]  sucralfate (CARAFATE) 1 g tablet Take 1 g by mouth  4 (four) times daily -  with meals and at bedtime.   Yes [provider]  torsemide (DEMADEX) 20 MG tablet Take 20 mg by mouth 2 (two) times daily. 02/20/24 02/19/25 Yes [provider]  valsartan (DIOVAN) 40 MG tablet Take 20 mg by mouth 2 (two) times daily. 02/20/24 02/19/25 Yes [provider]  vitamin B-12 (CYANOCOBALAMIN) 1000 MCG tablet Take 1,000 mcg by mouth daily.   Yes [provider]     Critical care time: 60 minutes   Raechel Chute, MD [facility] 02/28/2024 2:54 PM

## 2024-02-28 NOTE — Progress Notes (Signed)
 eLink Physician-Brief Progress Note Patient Name: Tammy Arias DOB: [birth year] MRN: [id number]   Date of Service  02/28/2024  HPI/Events of Note  68 yo F presenting to [facility] ED from home for evaluation of diarrhea and vomiting since 02/25/24.  Multiple hospitalizations with GI bleeding, watchman's in place complicated by exacerbations of heart failure.  She started developing diarrhea and eventually developed maroon stools concerning for GI bleeding.  On examination, she is tachypneic, tachycardic, and saturating 99% on room air.  She is currently on norepinephrine and vasopressin infusion.  Received broad-spectrum antibiotics.  Results show severe acidemia with anion gap, azotemia, and elevated creatinine.  Leukocytosis with anemia present.  ABO antibody testing pending and C. difficile positive.  eICU Interventions  Maintain norepinephrine and vasopressin infusions.  Maintain crystalloid resuscitation with bicarbonate infusion given GI losses  Continue with vancomycin oral and metronidazole IV for severe C. difficile  DVT chemoprophylaxis contraindicated in the setting of bleeding GI prophylaxis not indicated, therapeutic PPI in place     Intervention Category Evaluation Type: New Patient Evaluation  Ardie Dragoo 02/28/2024, 3:27 AM

## 2024-02-28 NOTE — Progress Notes (Addendum)
 Initial Nutrition Assessment  DOCUMENTATION CODES:   Not applicable  INTERVENTION:   RD will monitor for plan of enteral nutrition vs parenteral nutrition once appropriate  Pt at high refeed risk; recommend monitor potassium, magnesium and phosphorus labs daily until stable  Daily weights   NUTRITION DIAGNOSIS:   Inadequate oral intake related to acute illness as evidenced by NPO status  GOAL:   Provide needs based on ASPEN/SCCM guidelines  MONITOR:   Labs, Weight trends, TF tolerance, I & O's, Skin  REASON FOR ASSESSMENT:   Malnutrition Screening Tool, Rounds    ASSESSMENT:   68 y/o female with h/o ischemic cardiomyopathy, gastric AVM, GI angiodysplasia, IBS-D, IDA, s/p watchman device, cholelithiasis, TIA, hypothyroidism, pulmonary hypertension, GERD, barrett's esophagus, HLD, HTN, OSA, CAD s/p CABG x 3, PAF, CHF, cirrhosis, portal hypertension, gout, CKD III, B12 deficiency, ventral hernia s/p mesh repair 2018, colonic angioectasias, diverticulosis, DDD, hiatal hernia, esophageal varices and recent admission to [facility] with C-diff and CHF who is admitted with sepsis, pancolitis, AKI and GIB.  Met with pt in room today. Pt reports poor appetite and oral intake for 1 week pta r/t vomiting and diarrhea. Pt reports at baseline, her oral intake is good. Pt reports decreased oral intake for the past month r/t her recent admission to [facility]. Pt reports that she admitted to [facility] with volume overload. Pt reports that on admission she weighed 158lbs and at the time of discharge she weighed ~122lbs. Per chart, it appears pt weighed ~137lbs at discharge and does appear to be down ~13lbs from her UBW; RD unsure of the time frame. Plan today is for exploratory laparotomy. Once appropriate for nutrition support, recommend TPN initiation if enteral nutrition is not able to be initiated. Pt is at high refeed risk.   Medications reviewed and include: insulin, synthroid, protonix, vancomycin,  metronidazole, levophed, zosyn, Na bicarbonate, vasopressin    Labs reviewed: Na 130(L), K 3.3(L), BUN 84(H), creat 6.19(H), P 7.9(H), Mg 1.6(L) BNP- 579.8(H) Wbc- 43.1(H), Hgb 11.0(L), Hct 32.9(L) Cbgs- 203, 196, 177 x 24 hrs  AIC 5.9(H)  NUTRITION - FOCUSED PHYSICAL EXAM:  Flowsheet Row Most Recent Value  Orbital Region No depletion  Upper Arm Region No depletion  Thoracic and Lumbar Region No depletion  Buccal Region No depletion  Temple Region No depletion  Clavicle Bone Region No depletion  Clavicle and Acromion Bone Region No depletion  Scapular Bone Region No depletion  Dorsal Hand No depletion  Patellar Region No depletion  Anterior Thigh Region No depletion  Posterior Calf Region No depletion  Edema (RD Assessment) None  Hair Reviewed  Eyes Reviewed  Mouth Reviewed  Skin Reviewed  Nails Reviewed   Diet Order:   Diet Order             Diet NPO time specified Except for: Ice Chips, Sips with Meds  Diet effective now                  EDUCATION NEEDS:   Education needs have been addressed  Skin:  Skin Assessment: Reviewed RN Assessment  Last BM:  4/2- TYPE 6  Height:   Ht Readings from Last 1 Encounters:  02/28/24 5\' 1"  (1.549 m)    Weight:   Wt Readings from Last 1 Encounters:  02/28/24 61.3 kg    Ideal Body Weight:  47.7 kg  BMI:  Body mass index is 25.53 kg/m.  Estimated Nutritional Needs:   Kcal:  1600-1800kcal/day  Protein:  90-105g/day  Fluid:  1.3-1.5L/day  Betsey Holiday MS, RD, LDN If unable to be reached, please send secure chat to "RD inpatient" available from 8:00a-4:00p daily

## 2024-02-28 NOTE — Anesthesia Procedure Notes (Signed)
 Arterial Line Insertion Start/End4/12/2023 12:05 PM, 02/28/2024 12:10 PM Performed by: Lenard Simmer, MD, Omer Jack, CRNA, anesthesiologist  Patient location: Pre-op. Preanesthetic checklist: patient identified, IV checked, site marked, risks and benefits discussed, surgical consent, monitors and equipment checked, pre-op evaluation, timeout performed and anesthesia consent Lidocaine 1% used for infiltration Left, radial was placed Catheter size: 20 G Hand hygiene performed  and maximum sterile barriers used   Attempts: 3 Procedure performed without using ultrasound guided technique. Following insertion, dressing applied and Biopatch. Post procedure assessment: normal and unchanged  Post procedure complications: unsuccessful attempts. Patient tolerated the procedure well with no immediate complications.

## 2024-02-28 NOTE — ED Provider Notes (Incomplete)
 [facility] Provider Note    Event Date/Time   First MD Initiated Contact with Patient 02/27/24 1830     (approximate)   History   Diarrhea   HPI  Tammy Arias is a 68 y.o. female with a history of hypertension, diabetes, paroxysmal atrial fibrillation on Eliquis, diastolic CHF, CAD, ischemic cardiomyopathy who presents with diarrhea and generalized weakness.  The patient states that she was recently hospitalized at [facility] with C. difficile.  Previously she had been hospitalized for CHF exacerbation.  She has had increased diarrhea, vomiting, and generalized weakness for the last 3 to 4 days.  She denies any acute abdominal pain.  I reviewed the past medical records.  The patient was most was admitted to the hospitalist service in July 2024 with acute onset of GI bleed.  She was admitted to [facility] in February with acute on chronic CHF she was last seen by Dr. Smith Robert from hematology on 3/28 for follow-up of iron deficiency anemia.  At that time her hemoglobin was stable at 12.4.   Physical Exam   Triage Vital Signs: ED Triage Vitals  Encounter Vitals Group     BP 02/27/24 1829 (!) 58/35     Systolic BP Percentile --      Diastolic BP Percentile --      Pulse Rate 02/27/24 1829 89     Resp 02/27/24 1829 17     Temp 02/27/24 1833 (!) 97.4 F (36.3 C)     Temp Source 02/27/24 1833 Oral     SpO2 02/27/24 1829 97 %     Weight 02/27/24 1830 123 lb 3.2 oz (55.9 kg)     Height 02/27/24 1830 5\' 1"  (1.549 m)     Head Circumference --      Peak Flow --      Pain Score 02/27/24 1829 5     Pain Loc --      Pain Education --      Exclude from Growth Chart --     Most recent vital signs: Vitals:   02/27/24 1830 02/27/24 1833  BP: (!) 80/40   Pulse:    Resp:    Temp:  (!) 97.4 F (36.3 C)  SpO2:       General: Alert, weak appearing, no distress.  CV:  Good peripheral perfusion.  Resp:  Normal effort.  Lungs CTAB. Abd:  Soft and nontender.  No  distention.  Other:  No jaundice or scleral icterus.  Dry mucous membranes.   ED Results / Procedures / Treatments   Labs (all labs ordered are listed, but only abnormal results are displayed) Labs Reviewed  C DIFFICILE QUICK SCREEN W PCR REFLEX    GASTROINTESTINAL PANEL BY PCR, STOOL (REPLACES STOOL CULTURE)  COMPREHENSIVE METABOLIC PANEL WITH GFR  LIPASE, BLOOD  CBC WITH DIFFERENTIAL/PLATELET     EKG  ED ECG REPORT I, Dionne Bucy, the attending physician, personally viewed and interpreted this ECG.  Date: 02/27/2024 EKG Time: 1841 Rate: 86 Rhythm: Atrial flutter QRS Axis: normal Intervals: IVCD, prolonged QTc ST/T Wave abnormalities: Nonspecific ST abnormalities Narrative Interpretation: no evidence of acute ischemia    RADIOLOGY  Chest x-ray: I independently viewed and interpreted the images; the central line is in appropriate position with tip in the right atrium.  CT abdomen/pelvis: Pending  PROCEDURES:  Critical Care performed: Yes, see critical care procedure note(s)  .Critical Care  Performed by: Dionne Bucy, MD Authorized by: Dionne Bucy, MD   Critical care  provider statement:    Critical care time (minutes):  75   Critical care time was exclusive of:  Separately billable procedures and treating other patients   Critical care was necessary to treat or prevent imminent or life-threatening deterioration of the following conditions:  Circulatory failure, sepsis and shock   Critical care was time spent personally by me on the following activities:  Development of treatment plan with patient or surrogate, discussions with consultants, evaluation of patient's response to treatment, examination of patient, ordering and review of laboratory studies, ordering and review of radiographic studies, ordering and performing treatments and interventions, pulse oximetry, re-evaluation of patient's condition, review of old charts, obtaining history  from patient or surrogate and vascular access procedures   Care discussed with: admitting provider   Central Line  Date/Time: 02/28/2024 12:03 AM  Performed by: Dionne Bucy, MD Authorized by: Dionne Bucy, MD   Consent:    Consent obtained:  Verbal   Consent given by:  Patient   Risks, benefits, and alternatives were discussed: yes     Risks discussed:  Arterial puncture, incorrect placement, bleeding, infection and pneumothorax   Alternatives discussed:  Alternative treatment Universal protocol:    Patient identity confirmed:  Verbally with patient and arm band Pre-procedure details:    Indication(s): central venous access and insufficient peripheral access     Hand hygiene: Hand hygiene performed prior to insertion     Sterile barrier technique: All elements of maximal sterile technique followed     Skin preparation:  Chlorhexidine   Skin preparation agent: Skin preparation agent completely dried prior to procedure   Sedation:    Sedation type:  None Anesthesia:    Anesthesia method:  Local infiltration   Local anesthetic:  Lidocaine 1% w/o epi Procedure details:    Location:  R internal jugular   Patient position:  Trendelenburg   Procedural supplies:  Triple lumen   Landmarks identified: yes     Ultrasound guidance: yes     Ultrasound guidance timing: real time     Sterile ultrasound techniques: Sterile gel and sterile probe covers were used     Number of attempts:  1   Successful placement: yes   Post-procedure details:    Post-procedure:  Dressing applied and line sutured   Assessment:  Blood return through all ports, no pneumothorax on x-ray, free fluid flow and placement verified by x-ray   Procedure completion:  Tolerated well, no immediate complications    MEDICATIONS ORDERED IN ED: Medications - No data to display   IMPRESSION / MDM / ASSESSMENT AND PLAN / ED COURSE  I reviewed the triage vital signs and the nursing notes.    Differential  diagnosis includes, but is not limited to, ***  Patient's presentation is most consistent with {EM COPA:27473}  *** {If the patient is on the monitor, remove the brackets and asterisks on the sentence below and remember to document it as a Procedure as well. Otherwise delete the sentence below:1} {**The patient is on the cardiac monitor to evaluate for evidence of arrhythmia and/or significant heart rate changes.  ----------------------------------------- 9:01 PM on 02/27/2024 -----------------------------------------    FINAL CLINICAL IMPRESSION(S) / ED DIAGNOSES   Final diagnoses:  None     Rx / DC Orders   ED Discharge Orders     None        Note:  This document was prepared using Dragon voice recognition software and may include unintentional dictation errors.

## 2024-02-28 NOTE — Progress Notes (Signed)
 Pharmacy Antibiotic Note  Tammy Arias is a 68 y.o. female w/ PMH of HFrEf, atrial fibrilllation, cirrhosis, glaucoma, gout admitted on 02/27/2024 with evaluation of diarrhea and vomiting.  Pharmacy has been consulted for Zosyn dosing.  Plan: start Zosyn 2.25g IV q8h  ---follow renal function for needed dose adjustments  Height: 5\' 1"  (154.9 cm) Weight: 61.3 kg (135 lb 2.3 oz) IBW/kg (Calculated) : 47.8  Temp (24hrs), Avg:97.7 F (36.5 C), Min:97.4 F (36.3 C), Max:98.1 F (36.7 C)  Recent Labs  Lab 02/23/24 1446 02/27/24 1901 02/27/24 1919 02/28/24 0156 02/28/24 0608 02/28/24 0920  WBC 7.0 36.5*  --  45.4* 43.1*  --   CREATININE  --  7.04*  --  6.15* 6.17*  --   LATICACIDVEN  --   --  8.3* 4.6* 6.4* 7.9*    Estimated Creatinine Clearance: 7.2 mL/min (A) (by C-G formula based on SCr of 6.17 mg/dL (H)).    Allergies  Allergen Reactions   Vioxx [Rofecoxib] Swelling    Antimicrobials this admission: 04/01 cefepime, vancomycin IV x 1 04/01 metronidazole >>  04/01 vancomycin po  >>  04/02 Zosyn >>  Microbiology results: 04/02 BCx: NGTD 04/01 MRSA PCR: not detected 04/01 C diff Ag, toxin (+)  Thank you for allowing pharmacy to be a part of this patient's care.  Lowella Bandy 02/28/2024 11:15 AM

## 2024-02-28 NOTE — Plan of Care (Signed)
 Pt A&Ox4 and on RA. C/O back pain r/t previous fall. Lidocaine patch ordered. Remains on Vaso and Levo drips - titrating per parameters (see EMAR). Foley placed with no UOP - provider aware. Frequent loose, dark maroon stools noted since arrival to ICU - provider aware. Bicarb drip continues per orders. Potassium, magnesium, and calcium administered per orders.   Problem: Clinical Measurements: Goal: Ability to maintain clinical measurements within normal limits will improve Outcome: Progressing Goal: Will remain free from infection Outcome: Progressing Goal: Respiratory complications will improve Outcome: Progressing Goal: Cardiovascular complication will be avoided Outcome: Progressing   Problem: Nutrition: Goal: Adequate nutrition will be maintained Outcome: Not Progressing   Problem: Coping: Goal: Level of anxiety will decrease Outcome: Progressing   Problem: Elimination: Goal: Will not experience complications related to bowel motility Outcome: Not Progressing Goal: Will not experience complications related to urinary retention Outcome: Not Progressing   Problem: Pain Managment: Goal: General experience of comfort will improve and/or be controlled Outcome: Progressing   Problem: Safety: Goal: Ability to remain free from injury will improve Outcome: Progressing   Problem: Skin Integrity: Goal: Risk for impaired skin integrity will decrease Outcome: Not Progressing

## 2024-02-28 NOTE — Inpatient Diabetes Management (Signed)
 Inpatient Diabetes Program Recommendations  AACE/ADA: New Consensus Statement on Inpatient Glycemic Control (2015)  Target Ranges:  Prepandial:   less than 140 mg/dL      Peak postprandial:   less than 180 mg/dL (1-2 hours)      Critically ill patients:  140 - 180 mg/dL    Latest Reference Range & Units 02/28/24 06:08  Sodium 135 - 145 mmol/L 124 (L)  Potassium 3.5 - 5.1 mmol/L 3.7  Chloride 98 - 111 mmol/L 89 (L)  CO2 22 - 32 mmol/L 9 (L)  Glucose 70 - 99 mg/dL 811 (H)  BUN 8 - 23 mg/dL 84 (H)  Creatinine 9.14 - 1.00 mg/dL 7.82 (H)  Calcium 8.9 - 10.3 mg/dL 6.8 (L)  Anion gap 5 - 15  26 (H)  (L): Data is abnormally low (H): Data is abnormally high   Admit severe sepsis with shock secondary to recurrent C.Diff colitis requiring vasopressor support  Home DM Meds: Jardiance 10 mg daily + Metformin 1000 BID   Current: Novolog 0-9 units Q4H (started at 5am today)     MD- Note Creatinine elevated/ Anion Gap elevated/ CO2 level low  Note that pt takes Jardiance at home  Do we need to check BHB level to rule out DKA?    --Will follow patient during hospitalization--  Ambrose Finland RN, MSN, CDCES Diabetes Coordinator Inpatient Glycemic Control Team Team Pager: [phone] (8a-5p)

## 2024-02-28 NOTE — Progress Notes (Addendum)
 0140 - Pt arrived to ICU 5 via hospital stretcher. Pt A&O x4 and on RA. Pt reports upper and lower dentures in place and that she has hearing aids, but those are at home. Pt transferred to ICU bed and connected to tele monitor.CHG bath completed and labs obtained from central line (phlebotomy drew blood cultures from left arm).   0300 - Notified Cheryll Cockayne, NP that pt has had 2 small dark maroon stools since arrival to ICU. Foley inserted per orders to monitor urine output due to critically ill patient. Pt educated on procedure and agreeable to insertion.   1610 - Notified pt's friend, Tenny Craw, that pt has been settled in ICU RM 5 per pt's request.

## 2024-02-28 NOTE — ED Notes (Signed)
 This RN was informed of another RN stopping Levophed d/t "bag being empty" instead of hanging another bag to be infused as pt needs to remain on drip d/y hypotension. This RN restarted levophed drip at 19 mcg/min (71.3 ml/hr) as rate she was on before another RN stopped the infusion.

## 2024-02-28 NOTE — Op Note (Signed)
 Total abdominal colectomy with end ileostomy.  Pre-operative Diagnosis: Septic shock, C. difficile colitis, questionable ischemic colitis.  Post-operative Diagnosis: same, septic shock likely secondary to fulminant C. difficile colitis.  Surgeon: Campbell Lerner, M.D., FACS  Assistant: Lynden Oxford, PA-C  Please note that [name] was scrubbed in for the entirety of the case.  His assistance was critical due to the complexity of the case, and he assisted with the exposure, hemostasis, mobilization and resection.  Anesthesia: General Endotracheal  Findings: Pancolitis with no evidence of appreciated ischemic serosal changes.  Estimated Blood Loss: 100 mL         Specimens: Total abdominal colon divided into 2 specimens.          Complications: none              Procedure Details  The patient was seen again in the Holding Room. The benefits, complications, treatment options, and expected outcomes were discussed with the patient. The risks of bleeding, infection, recurrence of symptoms, failure to resolve symptoms, unanticipated injury, prosthetic placement, prosthetic infection, any of which could require further surgery were reviewed with the patient. The likelihood of improving the patient's symptoms with return to their baseline status is hopeful, but odds are against it.  The patient and/or family concurred with the proposed plan, giving informed consent.  The patient was taken to Operating Room, identified and the procedure verified.    Prior to the induction of general anesthesia, antibiotic prophylaxis was administered. VTE prophylaxis was in place.  General anesthesia was then administered and tolerated well.  A-line and Foley catheter placed.  After the induction, the patient was positioned in the supine position and the abdomen was prepped with  Chloraprep and draped in the sterile fashion.  A Time Out was held and the above information confirmed.  A midline incision is made  and extended down through subcutaneous tissues, dividing the linea alba adjacent to the umbilicus.  The peritoneum was carefully entered, and the incision extended cephalad and caudad.  The abdomen was explored and the small bowel was grossly viable without any evidence of ischemia or palpable abnormality.  The colon was edematous and diffusely thickened from the rectosigmoid junction all the way through the ascending colon.  There is copious volume of intraperitoneal fluid, slightly cloudy, slight gray discoloration but without malodor or evidence of peritoneal inflammatory change or exudate.  There is no evidence of perforation, no evidence of diverticulitis.  There is no evidence of ischemia based on the serosal appearance.  We felt it prudent that her source of sepsis was likely due to fulminant C. difficile colitis, and we proceeded with total abdominal colectomy with end ileostomy. We mobilized the left colon at its junction with the sigmoid dividing the line of Toldt and mobilizing the left colon throughout the splenic flexure.  We utilized the LigaSure to mobilize the the omentum from the splenic flexure and transverse colon.  I then mobilized the sigmoid distally to the rectosigmoid junction. I divided the colonic mesentery on the left colon hugging the colon and avoiding retroperitoneal structures throughout.  We utilized the LigaSure for hemostasis.  Upon mobilizing the rectosigmoid junction, I confirmed the rectum was uninvolved.  We proceeded with dividing the rectosigmoid junction with a GIA 55 stapler. We then proceeded with mobilization of the right colon by dividing the lateral peritoneal attachments at the appendix, and mobilized the right colon medially off the retroperitoneal structures and duodenum.  Divided the distal ileum after creating a mesenteric  window, utilizing a GIA 55 stapler.  The right colonic mesentery was also divided in like manner utilizing ligature, traversing through the  transverse colonic mesocolon, to completely free the colon for delivery of specimen. We then confirmed adequate hemostasis throughout the abdomen.  She was quite oozy throughout the procedure with tiny blood vessels showing evidence of persistence.  We utilized judicious electrocautery in addition to the LigaSure.  Once this was confirmed, we irrigated the abdomen with multiple aliquots of saline solution.  Aspirating it and confirming hemostasis prior to closure. I proceeded with excising a circular segment of skin in the right lower quadrant, draining a fascial defect with electrosurgery.  Dilating the opening to approximately 1-1/2-2 fingers diameter, mobilized the distal ileum to pass it through this defect. We then proceeded with closure of the abdominal wall fascia with running 0 PDS.  Irrigated the subcutaneous tissues, confirmed hemostasis once again.  Closed the skin with staples and applied honeycomb dressing. The ileostomy was then matured, removing the staple line and maturing it with interrupted 3-0 Vicryl sutures.  Stomal appliance was fashioned to size, applied. Overall she appeared to tolerate the procedure well all things considered. She will remain intubated and be returned to the ICU in guarded condition.     Campbell Lerner M.D., [facility] 02/28/2024 2:27 PM

## 2024-02-28 NOTE — Consult Note (Addendum)
 [facility] SURGICAL CONSULTATION NOTE (initial) - cpt: 99254   HISTORY OF PRESENT ILLNESS (HPI):  68 y.o. female who presented to [facility] on 04/01 secondary to diarrhea. Patient, and friend at bedside, report she was admitted to to [facility] earlier in March for CHF exacerbation. Reportedly had "27 pounds of water weight" removed. During that admission, she was reportedly diagnosed with C diff infection. Patient's friend reports that since discharge she continued to have diarrhea and over the last 4 days has significantly worsened. She report fatigue, abdominal pain, distension, nausea, emesis, and bloody diarrhea. Due to this, she presents to our ED. Patient has a quite significant cardiac history including atrial fibrillation s/p Watchmen procedure in January 2025 and on Eliquis, ischemic cardiomyopathy, ASD repair, CAD s/p CABG x3. Also with DM and CKD. Only previous abdominal surgery is ventral hernia. Work up in the ED revealed leukocytosis to 36.5K (now 43.1K), Hgb to 12.6, sCr - 7.04, hyponatremia to 127, initial lactate 8.3 (initial improved by started to worsen again, now 7.9), C diff toxin and antigen positive. Additionally INR 5.0. She did have CT Abdomen/Pelvis which was concerning for pancolitis and inflammatory changes around the entire colon. She was admitted to the ICU. She is currently on Levophed and Vasopressin. She continues to be tachycardic to the 120 bpm.   Surgery is consulted by PCCM physician Dr. Raechel Chute, MD in this context for evaluation and management of sepsis and shock secondary to pancolitis vs toxic megacolon.  PAST MEDICAL HISTORY (PMH):  Past Medical History:  Diagnosis Date   AC (acromioclavicular) joint bone spurs, right    Anemia    Arthritis    Atrial fibrillation (HCC)    a.) CHA2DS2-VASc = 7 (age, sex, HFrEF, HTN, TIA x 2, T2DM). b.) rate/rhythm controlled on oral carvedilol; chronically anticoagulated with warfarin.   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 b.) R/LHC 03/29/2017: small RCA with occluded SVG; no significant Dz. Chronically occluded LAD with patent SVG to D1/LAD. Insignificant Dz in LCx. LM normal.   Cortical cataract    DOE (dyspnea on exertion)    DOE (dyspnea on exertion)    GERD (gastroesophageal reflux disease)    HFrEF (heart failure with reduced ejection fraction) (HCC)    a.) TTE 10/27/2014: EF 40%; glob HK; mild BAE; triv AR, mild MR/PR, mod TR.  b.) TTE 03/15/2017: EF 30%; glob HK; mod BAE; mod pHTN (RVSP 54.8 mmHg); mild PR, mod MR; sev TR.  c.) R/LHC 03/29/2017: EF 30-35%. d.)TTE 10/25/2018: EF 35%; mod BAE; mod BVE; glob HK; triv PR, mod MR, sev TR; RVSP 57.7 mmHg; G2DD.   History of shingles 2004   Hyperlipidemia    Hypertension    Hypothyroidism    IBS (irritable bowel syndrome)    Ischemic cardiomyopathy    a.) TTE 10/27/2014: EF 40%. b.) TTE 03/15/2017: EF 30%. c.) R/LHC 04/01/2017: EF 30-35%. d.) TTE 10/25/2018: EF 35%   Lumbar scoliosis    Lumbar spinal stenosis    Migraines    Osteoporosis    Pulmonary HTN (HCC)    a.) TTE 03/15/2017: EF 30-35%; RVSP 54.8 mmHg. b.) R/LHC 03/29/2017: mean PA 33 mmHg, PCWP 22 mmHg, LVEDP 14 mmHg, mean AO 79 mmHg; CO 7.89 L/min, CI 4.61 L/min/m   S/P CABG x  3 01/28/2004   Sleep apnea    T2DM (type 2 diabetes mellitus) (HCC)    TIA (transient ischemic attack) 05/29/2016   Vitamin B 12 deficiency      PAST SURGICAL HISTORY (PSH):  Past Surgical History:  Procedure Laterality Date   CARDIAC CATHETERIZATION     CATARACT EXTRACTION W/ INTRAOCULAR LENS IMPLANT Bilateral    Cataract Extraction with IOL   COLONOSCOPY N/A 12/19/2018   Procedure: COLONOSCOPY;  Surgeon: Pasty Spillers, MD;  Location: [facility] ENDOSCOPY;  Service: Endoscopy;  Laterality: N/A;   COLONOSCOPY WITH PROPOFOL N/A 06/17/2020    Procedure: COLONOSCOPY WITH PROPOFOL;  Surgeon: Pasty Spillers, MD;  Location: [facility] ENDOSCOPY;  Service: Endoscopy;  Laterality: N/A;   COLONOSCOPY WITH PROPOFOL N/A 03/30/2023   Procedure: COLONOSCOPY WITH PROPOFOL;  Surgeon: Jaynie Collins, DO;  Location[facility] ENDOSCOPY;  Service: Endoscopy;  Laterality: N/A;   COR TRIATRIATUM REPAIR N/A 01/28/2004   CORONARY ARTERY BYPASS GRAFT N/A 01/28/2004   3v CABG   ESOPHAGOGASTRODUODENOSCOPY N/A 12/19/2018   Procedure: ESOPHAGOGASTRODUODENOSCOPY (EGD);  Surgeon: Pasty Spillers, MD;  Location: [facility] ENDOSCOPY;  Service: Endoscopy;  Laterality: N/A;   ESOPHAGOGASTRODUODENOSCOPY (EGD) WITH PROPOFOL N/A 06/17/2020   Procedure: ESOPHAGOGASTRODUODENOSCOPY (EGD) WITH PROPOFOL;  Surgeon: Pasty Spillers, MD;  Location: [facility] ENDOSCOPY;  Service: Endoscopy;  Laterality: N/A;   ESOPHAGOGASTRODUODENOSCOPY (EGD) WITH PROPOFOL N/A 03/30/2023   Procedure: ESOPHAGOGASTRODUODENOSCOPY (EGD) WITH PROPOFOL;  Surgeon: Jaynie Collins, DO;  Location: [facility] ENDOSCOPY;  Service: Endoscopy;  Laterality: N/A;   RIGHT/LEFT HEART CATH AND CORONARY ANGIOGRAPHY Bilateral 03/29/2017   Procedure: Right/Left Heart Cath and Coronary Angiography;  Surgeon: Dalia Heading, MD;  Location: [facility] INVASIVE CV LAB;  Service: Cardiovascular;  Laterality: Bilateral;   TOTAL KNEE ARTHROPLASTY Right 04/05/2022   Procedure: TOTAL KNEE ARTHROPLASTY;  Surgeon: Kennedy Bucker, MD;  Location: [facility] ORS;  Service: Orthopedics;  Laterality: Right;   TUBAL LIGATION     VENTRAL HERNIA REPAIR N/A 04/21/2017   Procedure: HERNIA REPAIR VENTRAL ADULT;  Surgeon: Nadeen Landau, MD;  Location: [facility] ORS;  Service: General;  Laterality: N/A;     MEDICATIONS:  Prior to Admission medications   Medication Sig Start Date End Date Taking? Authorizing Provider  carvedilol (COREG) 3.125 MG tablet Take 3.125 mg by mouth 2 (two) times daily. 12/08/23  Yes [provider]  ENTRESTO 24-26  MG Take 1 tablet by mouth 2 (two) times daily. 12/27/23  Yes [provider]  albuterol (PROVENTIL HFA;VENTOLIN HFA) 108 (90 Base) MCG/ACT inhaler Inhale 2 puffs into the lungs every 6 (six) hours as needed for wheezing or shortness of breath.    [provider]  aspirin EC 81 MG tablet Take 81 mg by mouth daily. Patient not taking: Reported on 07/21/2023    [provider]  carvedilol (COREG) 6.25 MG tablet Take 6.25 mg by mouth 2 (two) times daily with a meal. Patient not taking: Reported on 02/23/2024 01/06/23   [provider]  ELIQUIS 5 MG TABS tablet Take 5 mg by mouth 2 (two) times daily.    [provider]  fluticasone (FLONASE) 50 MCG/ACT nasal spray Place 1 spray into both nostrils at bedtime. 02/05/19   [provider]  furosemide (LASIX) 40 MG tablet Take 40 mg by mouth daily. Pt stated she take 40 mg once per day Patient not taking: Reported on 02/23/2024    [provider]  JARDIANCE 10 MG TABS tablet Take 10 mg by mouth daily.  [provider]  latanoprost (XALATAN) 0.005 % ophthalmic solution Place 1 drop into both eyes at bedtime.    [provider]  levothyroxine (SYNTHROID) 50 MCG tablet Take 50 mcg by mouth daily before breakfast.    [provider]  lovastatin (MEVACOR) 10 MG tablet Take 10 mg by mouth at bedtime. Patient not taking: Reported on 02/23/2024    [provider]  metFORMIN (GLUCOPHAGE) 1000 MG tablet Take 1,000 mg by mouth 2 (two) times daily with a meal.    [provider]  ONETOUCH ULTRA test strip  10/15/22   [provider]  pantoprazole (PROTONIX) 40 MG tablet Take 40 mg by mouth daily. 01/28/21   [provider]  polyethylene glycol-electrolytes (NULYTELY) 420 g solution Take by mouth. Patient not taking: Reported on 02/23/2024 06/30/23   [provider]  rosuvastatin (CRESTOR) 5 MG tablet Take by mouth. 02/20/24 02/19/25  [provider]  sacubitril-valsartan (ENTRESTO) 49-51 MG Take 1 tablet by mouth 2 (two) times daily. Patient not taking: Reported on 02/23/2024    [provider]  spironolactone (ALDACTONE) 25 MG tablet Take 25 mg by mouth daily.    [provider]  torsemide (DEMADEX) 20 MG tablet Take by mouth. 02/20/24 02/19/25  [provider]  valsartan (DIOVAN) 40 MG tablet Take by mouth. 02/20/24 02/19/25  [provider]  vitamin B-12 (CYANOCOBALAMIN) 1000 MCG tablet Take 1,000 mcg by mouth daily.    [provider]     ALLERGIES:  Allergies  Allergen Reactions   Vioxx [Rofecoxib] Swelling     SOCIAL HISTORY:  Social History   Socioeconomic History   Marital status: Widowed    Spouse name: Not on file   Number of children: Not on file   Years of education: Not on file   Highest education level: Not on file  Occupational History   Occupation: certified nursing assistant  Tobacco Use   Smoking status: Former    Current packs/day: 0.00    Types: Cigarettes    Quit date: 03/30/1999    Years since quitting: 24.9   Smokeless tobacco: Never  Vaping Use   Vaping status: Never Used  Substance and Sexual Activity   Alcohol use: No   Drug use: No   Sexual activity: Not Currently  Other Topics Concern   Not on file  Social History Narrative   Not on file   Social Drivers of Health   Financial Resource Strain: Low Risk  (02/11/2024)   Received from [facility]   Overall Financial Resource Strain (CARDIA)    Difficulty of Paying Living Expenses: Not very hard  Recent Concern: Financial Resource Strain - Medium Risk (12/13/2023)   Received from [facility]   Overall Financial Resource Strain (CARDIA)    Difficulty of Paying Living Expenses: Somewhat hard  Food Insecurity: No Food Insecurity (02/28/2024)   Hunger Vital Sign    Worried About Running Out of Food in the Last Year: Never true    Ran Out of Food in the  Last Year: Never true  Recent Concern: Food Insecurity - Food Insecurity Present (12/13/2023)   Received from [facility]   Hunger Vital Sign    Ran Out of Food in the Last Year: Sometimes true    Worried About Running Out of Food in the Last Year: Never true  Transportation Needs: No Transportation Needs (02/28/2024)   PRAPARE - Administrator, Civil Service (Medical):  No    Lack of Transportation (Non-Medical): No  Physical Activity: Not on file  Stress: Not on file  Social Connections: Moderately Integrated (02/28/2024)   Social Connection and Isolation Panel [NHANES]    Frequency of Communication with Friends and Family: Three times a week    Frequency of Social Gatherings with Friends and Family: Three times a week    Attends Religious Services: More than 4 times per year    Active Member of Clubs or Organizations: Yes    Attends Banker Meetings: More than 4 times per year    Marital Status: Widowed  Intimate Partner Violence: Not At Risk (02/28/2024)   Humiliation, Afraid, Rape, and Kick questionnaire    Fear of Current or Ex-Partner: No    Emotionally Abused: No    Physically Abused: No    Sexually Abused: No     FAMILY HISTORY:  Family History  Problem Relation Age of Onset   Valvular heart disease Mother    Diabetes Father    Cancer Sister        lung   Cancer Sister        cervical   Breast cancer Cousin       REVIEW OF SYSTEMS:  Review of Systems  Constitutional:  Negative for chills and fever.  Respiratory:  Positive for shortness of breath. Negative for cough.   Cardiovascular:  Negative for chest pain and palpitations.  Gastrointestinal:  Positive for abdominal pain, blood in stool, diarrhea, nausea and vomiting.  Genitourinary:  Negative for dysuria and urgency.  All other systems reviewed and are negative.   VITAL SIGNS:  Temp:  [97.4 F (36.3 C)-98.1 F (36.7 C)] 98.1 F (36.7 C) (04/02 0730) Pulse Rate:   [25-134] 112 (04/02 0800) Resp:  [11-29] 18 (04/02 0800) BP: (52-112)/(19-89) 107/59 (04/02 0800) SpO2:  [55 %-100 %] 94 % (04/02 0800) Weight:  [55.9 kg-61.3 kg] 61.3 kg (04/02 0515)     Height: 5\' 1"  (154.9 cm) Weight: 61.3 kg BMI (Calculated): 25.55   INTAKE/OUTPUT:  04/01 0701 - 04/02 0700 In: 5231.5 [I.V.:1692.4; IV Piggyback:3539] Out: -   PHYSICAL EXAM:  Physical Exam Vitals and nursing note reviewed. Exam conducted with a chaperone present.  Constitutional:      General: She is not in acute distress.    Comments: Patient resting in bed; friend at bedside. Appears uncomfortable. Having bloody diarrhea at time of my evaluation   HENT:     Head: Normocephalic and atraumatic.  Eyes:     General: No scleral icterus.    Conjunctiva/sclera: Conjunctivae normal.     Comments: Wearing glasses  Cardiovascular:     Rate and Rhythm: Tachycardia present.     Pulses: Normal pulses.     Heart sounds: No murmur heard. Pulmonary:     Effort: Pulmonary effort is normal. No respiratory distress.     Comments: On [place] Abdominal:     General: Abdomen is protuberant. There is distension.     Tenderness: There is generalized abdominal tenderness. There is no guarding or rebound.     Comments: Abdomen is soft but distended, she is diffusely tender, highly concerning for peritonitis   Genitourinary:    Comments: Deferred Skin:    General: Skin is warm and dry.  Neurological:     General: No focal deficit present.     Mental Status: She is alert and oriented to person, place, and time.  Psychiatric:        Mood and  Affect: Mood normal.        Behavior: Behavior normal.      Labs:     Latest Ref Rng & Units 02/28/2024    6:08 AM 02/28/2024    1:56 AM 02/27/2024    7:01 PM  CBC  WBC 4.0 - 10.5 K/uL 43.1  45.4  36.5   Hemoglobin 12.0 - 15.0 g/dL 57.8  46.9  62.9   Hematocrit 36.0 - 46.0 % 32.9  34.9  39.2   Platelets 150 - 400 K/uL 285  303  279       Latest Ref Rng & Units 02/28/2024     6:08 AM 02/28/2024    1:56 AM 02/27/2024    7:01 PM  CMP  Glucose 70 - 99 mg/dL 528  413  75   BUN 8 - 23 mg/dL 84  86  94   Creatinine 0.44 - 1.00 mg/dL 2.44  0.10  2.72   Sodium 135 - 145 mmol/L 124  123  127   Potassium 3.5 - 5.1 mmol/L 3.7  3.5  4.5   Chloride 98 - 111 mmol/L 89  90  89   CO2 22 - 32 mmol/L 9  7  7    Calcium 8.9 - 10.3 mg/dL 6.8  6.3  7.5   Total Protein 6.5 - 8.1 g/dL 5.8   6.6   Total Bilirubin 0.0 - 1.2 mg/dL 1.2   0.9   Alkaline Phos 38 - 126 U/L 109   139   AST 15 - 41 U/L 33   40   ALT 0 - 44 U/L 20   19      Imaging studies:   CT Abdomen/Pelvis (02/27/2024) personally reviewed with inflammatory changes about the entire colon, no gross free air or pneumatosis, and radiologist report reviewed below:  IMPRESSION: Diffuse colonic wall thickening and surrounding inflammation compatible with pancolitis.   Cholelithiasis.   Aortic atherosclerosis.       Assessment/Plan:  68 y.o. critically ill female requiring multiple vasopressor support for sepsis/shock likely secondary to pancolitis vs toxic megacolon and recent C diff infection, complicated by quite extensive/significant cardiac history, ARF, deconditioning.   - Given her presenting symptoms, recent C diff infection, leukocytosis, venous lactate elevation, and hemodynamic instability requiring vasopressor support, I am extremely concern for intra-abdominal source (ex: toxic megacolon, infectious colitis, ischemic colitis). I do think she needs emergent surgical intervention and likely subtotal colectomy and end ileostomy. I spent extensive time with the patient, and her firend at bedside, reviewing this and my thought process. She is at significant perioperative risk for morbidity and mortality. She understands she may need prolonged ICU stay, ventilator support, potentially multiple procedures, infection, bleeding, injury, and even death. I did discuss that the alternative would be aggressive  supportive care and medical management but that this may be a terminal event without intervention. She seemed to understand this clearly. I allowed time for questions. At the end of our discussion, patient elected to procedure with surgery understanding the significant risks associated.    - We will plan for exploratory laparotomy, possible subtotal colectomy, and end ileostomy creation with Dr Claudine Mouton as soon as OR/Anesthesia is available - All risks, benefits, and alternatives to above procedure(s) were discussed with the patient and her family, all of their questions were answered to their expressed satisfaction, patient expresses she wishes to proceed, and informed consent was obtained.   - Continue IV Abx (Zosyn, Flagyl); She is also on PO Vancomycin - Appreciate  PCCM support   All of the above findings and recommendations were discussed with the patient and her family, and all of patient's their questions were answered to their expressed satisfaction.  Thank you for the opportunity to participate in this patient's care.   Face-to-face time spent with the patient and care providers was 60 minutes, with more than 50% of the time spent counseling, educating, and coordinating care of the patient.     -- Lynden Oxford, PA-C  [facility] 02/28/2024, 10:54 AM M-F: 7am - 4pm

## 2024-02-28 NOTE — ED Notes (Signed)
 Levophed gtt pump alarming bag appears completely empty, pt states "I can get that urine for her now" Mandy RN notified pt Levophed gtt complete

## 2024-02-28 NOTE — Anesthesia Preprocedure Evaluation (Signed)
 Anesthesia Evaluation  Patient identified by MRN, date of birth, ID band Patient awake    Reviewed: Allergy & Precautions, NPO status , Patient's Chart, lab work & pertinent test results  History of Anesthesia Complications Negative for: history of anesthetic complications  Airway Mallampati: III   Neck ROM: Full    Dental  (+) Lower Dentures, Upper Dentures, Dental Advidsory Given   Pulmonary sleep apnea , COPD,  COPD inhaler, former smoker   Pulmonary exam normal breath sounds clear to auscultation       Cardiovascular hypertension, + CAD (s/p CABG), + CABG and +CHF (ICM, EF 40%)  + dysrhythmias (a fib on warfarin, last 4 days ago) + Valvular Problems/Murmurs (cor triatrium s/p repair)  Rhythm:Irregular Rate:Normal + Systolic murmurs ECG 03/29/22:  Atrial fibrillation ST & T wave abnormality, consider inferolateral ischemia  Myocardial perfusion 06/10/20:  LVEF= 51%  Regional wallmotion: reveals normal myocardial thickening and wall motion.  The overall quality of the study is good.   Artifacts noted: no  Left ventricular cavity: normal.  Perfusion Analysis: SPECT images demonstrate small perfusion abnormality of mild intensity is present in the inferior region on the stress images.  Defect type: Fixed  Cardiac MRI 09/28/20:  1. The left ventricle is upper limits of normal in cavity size with normal wall thickness. Global systolic function is moderately reduced with an LV ejection fraction calculated at 42%. There is wall thinning and severe hypokinesis to akinesis of the basal-to-mid lateral wall. There is global mild-to-moderate hypokinesis of the remaining LV walls.  2. The right ventricle is mildly dilated in cavity size with normal wall thickness. Global RV systolic function is mild-moderately reduced with an RVEF calculated at 37%.  3. Both atria are severely enlarged. There is no obvious inter-atrial flow communication,  and Qp:Qs is normal, however a small PFO cannotbe ruled out by CMR.  4. The aortic valve is trileaflet in morphology. There is no significant aortic valve stenosis or regurgitation. There is a central jet of mild-moderate mitral regurgitation. There is also a central jet of moderate tricuspid regurgitation. There is mild pulmonic regurgitation.  5. Delayed enhancement imaging is abnormal. There is a transmural myocardial infarction involving the  basal-to-mid anterolateral and inferolateral LV walls, which is consistent with the LCx perfusion territory. There are areas of fatty metaplasia within the infarcted region.  6. Adenosine stress perfusion imaging demonstrates no evidence of inducible myocardial ischemia.   7. No intracardiac thrombus visualized.  8. The pericardium is normal in thickness. There is no significant pericardial effusion.  9. 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    Neuro/Psych  Headaches TIA   GI/Hepatic ,GERD  ,,  Endo/Other  diabetes, Type 2Hypothyroidism    Renal/GU negative Renal ROS     Musculoskeletal   Abdominal   Peds  Hematology  (+) Blood dyscrasia, anemia   Anesthesia Other Findings Patient coming for emergency ex-lap and is on multiple pressors at this time.  Discussed patient her elevated risk of adverse events under anesthesia due to her heart condition. Patient understands and agrees to proceed.  Cardiology note 03/14/23:  CAD s/p CABG 2005, heart catheterization completed in 2018 showed a occluded mid LAD with a patent SVG graft to a diagonal provided flow to the LAD. The RCA and left circumflex were relatively free of disease otherwise with no obvious other grafts present. Continue aspirin 81 mg indefinitely, lovastatin, and coreg. Cardiac CT ordered previously Chronic systolic heart failure,  EF estimated at 30-35%; although my review of the echocardiogram in some views this might even be higher at just  over 40%. Continue Coreg, Entresto, spironolactone. Today we added Jardiance and reduced her Lasix. Ischemic cardiomyopathy, EF=30-35%, recommend above medical therapy    # Cor triatriatum ASD repair 2005 Closed in [facility]. No apparent shunting on recent echo or see MRI. 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  #CAD s/p CABG 2005 Heart catheterization completed in 2018 showed a occluded mid LAD with a patent SVG graft to a diagonal provided flow to the LAD. The RCA and left circumflex were relatively free of disease otherwise with no obvious other grafts present. In reviewing the course of events with the patient, it sounds that this was done emergently following closure of her ASD in 2005 possibly as a result of her procedure. See MRI completed in November 2021 showed scar in the left circumflex distribution. - Aspirin 81 mg indefinitely - Continue lovastatin 10 mg - Coninue coreg 3.125 mg BID - Risk factor modification-lipid panel 08/19/2021-LDL 85  #Heart failure with reduced ejection fraction EF estimated at 42% by cardiac MRI in 2021. NYHA class 1. -Continue Coreg 3.125 mg twice daily -Continue Entresto 24-26 -Continue spironolactone 25 -Continue Lasix 40 mg daily -Check BMP today; renal function has not been checked since initiation of Entresto  #Chronic atrial fibrillation Remains well rate controlled. -Continue Coreg 3.125 mg twice daily -Continue warfarin  #Hypertension Blood pressure is well controlled on maximally tolerated GDMT for heart failure. Continue medications as above.  #Type 2 diabetes -Not on insulin therapy. Managed by primary care provider.  Return in about 6 months (around 05/11/2022).   Reproductive/Obstetrics                             Anesthesia Physical Anesthesia Plan  ASA: 4 and emergent  Anesthesia Plan: General   Post-op Pain Management:    Induction: Intravenous, Rapid sequence and Cricoid pressure planned  PONV  Risk Score and Plan: 3 and Treatment may vary due to age or medical condition, Ondansetron and Dexamethasone  Airway Management Planned: Oral ETT  Additional Equipment:   Intra-op Plan:   Post-operative Plan: Possible Post-op intubation/ventilation and Post-operative intubation/ventilation  Informed Consent: I have reviewed the patients History and Physical, chart, labs and discussed the procedure including the risks, benefits and alternatives for the proposed anesthesia with the patient or authorized representative who has indicated his/her understanding and acceptance.     Dental Advisory Given  Plan Discussed with: Anesthesiologist, CRNA and Surgeon  Anesthesia Plan Comments: (Patient consented for risks of anesthesia including but not limited to:  - adverse reactions to medications - risk of airway placement if required - damage to eyes, teeth, lips or other oral mucosa - nerve damage due to positioning  - sore throat or hoarseness - Damage to heart, brain, nerves, lungs, other parts of body or loss of life  Patient voiced understanding.)        Anesthesia Quick Evaluation

## 2024-02-28 NOTE — Progress Notes (Signed)
 PHARMACY CONSULT NOTE - FOLLOW UP  Pharmacy Consult for Electrolyte Monitoring and Replacement   Recent Labs: Potassium (mmol/L)  Date Value  02/28/2024 3.7   Magnesium (mg/dL)  Date Value  16/08/9603 1.6 (L)   Calcium (mg/dL)  Date Value  54/07/8118 6.8 (L)   Albumin (g/dL)  Date Value  14/78/2956 2.8 (L)  05/18/2020 4.7   Phosphorus (mg/dL)  Date Value  21/30/8657 7.9 (H)   Sodium (mmol/L)  Date Value  02/28/2024 124 (L)     Assessment:  68 year old Caucasian female with a past medical history significant for congestive heart failure with an EF of 35%, history of A. 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 Pharmacy is asked to follow and replace electrolytes while in CCU   Goal of Therapy:  Electrolytes WNL  Plan:  ---1 gram IV magnesium sulfate x 1 per NP ---recheck electrolytes in am  Lowella Bandy ,PharmD Clinical Pharmacist 02/28/2024 7:04 AM

## 2024-02-28 NOTE — Transfer of Care (Signed)
 Immediate Anesthesia Transfer of Care Note  Patient: Tammy Arias  Procedure(s) Performed: LAPAROTOMY, EXPLORATORY (Abdomen) COLECTOMY, TOTAL (Abdomen)  Patient Location: icu  Anesthesia Type:General  Level of Consciousness: Patient remains intubated per anesthesia plan  Airway & Oxygen Therapy: Patient remains intubated per anesthesia plan  Post-op Assessment: Report given to RN and Post -op Vital signs reviewed and stable  Post vital signs: Reviewed and stable  Last Vitals:  Vitals Value Taken Time  BP    Temp    Pulse 117 02/28/24 1441  Resp 18 02/28/24 1441  SpO2 97 % 02/28/24 1441  Vitals shown include unfiled device data.  Last Pain:  Vitals:   02/28/24 0730  TempSrc:   PainSc: 5          Complications: No notable events documented.

## 2024-02-29 ENCOUNTER — Encounter: Payer: Self-pay | Admitting: Surgery

## 2024-02-29 DIAGNOSIS — A0472 Enterocolitis due to Clostridium difficile, not specified as recurrent: Secondary | ICD-10-CM | POA: Diagnosis not present

## 2024-02-29 DIAGNOSIS — K5100 Ulcerative (chronic) pancolitis without complications: Secondary | ICD-10-CM | POA: Diagnosis not present

## 2024-02-29 DIAGNOSIS — I5020 Unspecified systolic (congestive) heart failure: Secondary | ICD-10-CM

## 2024-02-29 DIAGNOSIS — R6521 Severe sepsis with septic shock: Secondary | ICD-10-CM | POA: Diagnosis not present

## 2024-02-29 DIAGNOSIS — A419 Sepsis, unspecified organism: Secondary | ICD-10-CM | POA: Diagnosis not present

## 2024-02-29 LAB — GLUCOSE, CAPILLARY
Glucose-Capillary: 128 mg/dL — ABNORMAL HIGH (ref 70–99)
Glucose-Capillary: 153 mg/dL — ABNORMAL HIGH (ref 70–99)
Glucose-Capillary: 162 mg/dL — ABNORMAL HIGH (ref 70–99)
Glucose-Capillary: 163 mg/dL — ABNORMAL HIGH (ref 70–99)
Glucose-Capillary: 193 mg/dL — ABNORMAL HIGH (ref 70–99)
Glucose-Capillary: 195 mg/dL — ABNORMAL HIGH (ref 70–99)

## 2024-02-29 LAB — BPAM FFP
Blood Product Expiration Date: 202504032359
Blood Product Expiration Date: 202504042359
Blood Product Expiration Date: 202504072359
ISSUE DATE / TIME: 202504020623
ISSUE DATE / TIME: 202504021119
ISSUE DATE / TIME: 202504021234
Unit Type and Rh: 600
Unit Type and Rh: 6200
Unit Type and Rh: 6200

## 2024-02-29 LAB — BASIC METABOLIC PANEL WITH GFR
Anion gap: 18 — ABNORMAL HIGH (ref 5–15)
BUN: 74 mg/dL — ABNORMAL HIGH (ref 8–23)
CO2: 25 mmol/L (ref 22–32)
Calcium: 7.7 mg/dL — ABNORMAL LOW (ref 8.9–10.3)
Chloride: 89 mmol/L — ABNORMAL LOW (ref 98–111)
Creatinine, Ser: 4.17 mg/dL — ABNORMAL HIGH (ref 0.44–1.00)
GFR, Estimated: 11 mL/min — ABNORMAL LOW (ref >60)
Glucose, Bld: 147 mg/dL — ABNORMAL HIGH (ref 70–99)
Potassium: 3.2 mmol/L — ABNORMAL LOW (ref 3.5–5.1)
Sodium: 132 mmol/L — ABNORMAL LOW (ref 135–145)

## 2024-02-29 LAB — T3, FREE: T3, Free: 1.3 pg/mL — ABNORMAL LOW (ref 2.0–4.4)

## 2024-02-29 LAB — BLOOD GAS, ARTERIAL
Acid-base deficit: 7.6 mmol/L — ABNORMAL HIGH (ref 0.0–2.0)
Acid-base deficit: 2.7 mmol/L — ABNORMAL HIGH (ref 0.0–2.0)
Bicarbonate: 15.7 mmol/L — ABNORMAL LOW (ref 20.0–28.0)
Bicarbonate: 20.4 mmol/L (ref 20.0–28.0)
FIO2: 30 %
MECHVT: 450 mL
Mechanical Rate: 18
O2 Saturation: 100 %
O2 Saturation: 100 %
PEEP: 5 cmH2O
Patient temperature: 37
Patient temperature: 37
pCO2 arterial: 26 mmHg — ABNORMAL LOW (ref 32–48)
pCO2 arterial: 30 mmHg — ABNORMAL LOW (ref 32–48)
pH, Arterial: 7.39 (ref 7.35–7.45)
pH, Arterial: 7.44 (ref 7.35–7.45)
pO2, Arterial: 137 mmHg — ABNORMAL HIGH (ref 83–108)
pO2, Arterial: 148 mmHg — ABNORMAL HIGH (ref 83–108)

## 2024-02-29 LAB — CBC
HCT: 27.5 % — ABNORMAL LOW (ref 36.0–46.0)
Hemoglobin: 9.3 g/dL — ABNORMAL LOW (ref 12.0–15.0)
MCH: 31.8 pg (ref 26.0–34.0)
MCHC: 33.8 g/dL (ref 30.0–36.0)
MCV: 94.2 fL (ref 80.0–100.0)
Platelets: 209 10*3/uL (ref 150–400)
RBC: 2.92 MIL/uL — ABNORMAL LOW (ref 3.87–5.11)
RDW: 17.2 % — ABNORMAL HIGH (ref 11.5–15.5)
WBC: 19.3 10*3/uL — ABNORMAL HIGH (ref 4.0–10.5)
nRBC: 0.2 % (ref 0.0–0.2)

## 2024-02-29 LAB — URINALYSIS, COMPLETE (UACMP) WITH MICROSCOPIC
Bilirubin Urine: NEGATIVE
Glucose, UA: 150 mg/dL — AB
Ketones, ur: NEGATIVE mg/dL
Leukocytes,Ua: NEGATIVE
Nitrite: NEGATIVE
Protein, ur: 30 mg/dL — AB
RBC / HPF: >50 RBC/hpf (ref 0–5)
Specific Gravity, Urine: 1.009 (ref 1.005–1.030)
pH: 6 (ref 5.0–8.0)

## 2024-02-29 LAB — RENAL FUNCTION PANEL
Albumin: 3.5 g/dL (ref 3.5–5.0)
Anion gap: 27 — ABNORMAL HIGH (ref 5–15)
BUN: 76 mg/dL — ABNORMAL HIGH (ref 8–23)
CO2: 16 mmol/L — ABNORMAL LOW (ref 22–32)
Calcium: 7.6 mg/dL — ABNORMAL LOW (ref 8.9–10.3)
Chloride: 88 mmol/L — ABNORMAL LOW (ref 98–111)
Creatinine, Ser: 4.9 mg/dL — ABNORMAL HIGH (ref 0.44–1.00)
GFR, Estimated: 9 mL/min — ABNORMAL LOW (ref >60)
Glucose, Bld: 200 mg/dL — ABNORMAL HIGH (ref 70–99)
Phosphorus: 6.3 mg/dL — ABNORMAL HIGH (ref 2.5–4.6)
Potassium: 3.7 mmol/L (ref 3.5–5.1)
Sodium: 131 mmol/L — ABNORMAL LOW (ref 135–145)

## 2024-02-29 LAB — PREPARE FRESH FROZEN PLASMA: Unit division: 0

## 2024-02-29 LAB — COOXEMETRY PANEL
Carboxyhemoglobin: 1.8 % — ABNORMAL HIGH (ref 0.5–1.5)
Methemoglobin: 1 % (ref 0.0–1.5)
O2 Saturation: 79.3 %
Total hemoglobin: 11.3 g/dL — ABNORMAL LOW (ref 12.0–16.0)
Total oxygen content: 77.1 %

## 2024-02-29 LAB — TRIGLYCERIDES: Triglycerides: 563 mg/dL — ABNORMAL HIGH (ref <150)

## 2024-02-29 LAB — PROTIME-INR
INR: 2.1 — ABNORMAL HIGH (ref 0.8–1.2)
Prothrombin Time: 24.1 s — ABNORMAL HIGH (ref 11.4–15.2)

## 2024-02-29 LAB — PROCALCITONIN: Procalcitonin: 17.07 ng/mL

## 2024-02-29 LAB — HEMOGLOBIN AND HEMATOCRIT, BLOOD
HCT: 24.9 % — ABNORMAL LOW (ref 36.0–46.0)
HCT: 23.5 % — ABNORMAL LOW (ref 36.0–46.0)
Hemoglobin: 8.6 g/dL — ABNORMAL LOW (ref 12.0–15.0)
Hemoglobin: 8.1 g/dL — ABNORMAL LOW (ref 12.0–15.0)

## 2024-02-29 LAB — LACTIC ACID, PLASMA
Lactic Acid, Venous: 2.9 mmol/L (ref 0.5–1.9)
Lactic Acid, Venous: 2.6 mmol/L (ref 0.5–1.9)
Lactic Acid, Venous: 2.7 mmol/L (ref 0.5–1.9)

## 2024-02-29 LAB — MAGNESIUM: Magnesium: 1.5 mg/dL — ABNORMAL LOW (ref 1.7–2.4)

## 2024-02-29 MED ORDER — ACETAMINOPHEN 10 MG/ML IV SOLN
1000.0000 mg | Freq: Four times a day (QID) | INTRAVENOUS | Status: AC
Start: 2024-02-29 — End: 2024-03-02
  Administered 2024-02-29 – 2024-03-01 (×4): 1000 mg via INTRAVENOUS
  Filled 2024-02-29 (×4): qty 100

## 2024-02-29 MED ORDER — POTASSIUM CHLORIDE 10 MEQ/50ML IV SOLN
10.0000 meq | INTRAVENOUS | Status: AC
  Administered 2024-02-29 (×4): 10 meq via INTRAVENOUS
  Filled 2024-02-29 (×4): qty 50

## 2024-02-29 MED ORDER — ORAL CARE MOUTH RINSE: 15.0000 mL | OROMUCOSAL | Status: DC | PRN

## 2024-02-29 MED ORDER — MAGNESIUM SULFATE 2 GM/50ML IV SOLN
2.0000 g | Freq: Once | INTRAVENOUS | Status: AC
  Administered 2024-02-29: 2 g via INTRAVENOUS
  Filled 2024-02-29: qty 50

## 2024-02-29 MED ORDER — HYDROMORPHONE HCL 1 MG/ML IJ SOLN
0.5000 mg | INTRAMUSCULAR | Status: DC | PRN
  Administered 2024-02-29 – 2024-03-03 (×7): 0.5 mg via INTRAVENOUS
  Filled 2024-02-29 (×8): qty 1

## 2024-02-29 MED ORDER — LEVOTHYROXINE SODIUM 100 MCG/5ML IV SOLN: 33.0000 ug | Freq: Every day | INTRAVENOUS | Status: DC

## 2024-02-29 MED ORDER — ROSUVASTATIN CALCIUM 10 MG PO TABS: 5.0000 mg | ORAL_TABLET | Freq: Every day | ORAL | Status: DC

## 2024-02-29 MED ORDER — ALBUMIN HUMAN 25 % IV SOLN
25.0000 g | Freq: Once | INTRAVENOUS | Status: AC
  Administered 2024-02-29: 25 g via INTRAVENOUS
  Filled 2024-02-29: qty 100

## 2024-02-29 MED ORDER — LACTATED RINGERS IV BOLUS
1000.0000 mL | Freq: Once | INTRAVENOUS | Status: AC
Start: 2024-02-29 — End: 2024-02-29
  Administered 2024-02-29: 1000 mL via INTRAVENOUS

## 2024-02-29 MED ORDER — LACTATED RINGERS IV BOLUS
1000.0000 mL | Freq: Once | INTRAVENOUS | Status: AC
  Administered 2024-02-29: 1000 mL via INTRAVENOUS

## 2024-02-29 NOTE — Plan of Care (Signed)
  Problem: Clinical Measurements: Goal: Diagnostic test results will improve Outcome: Progressing Goal: Respiratory complications will improve Outcome: Progressing Goal: Cardiovascular complication will be avoided Outcome: Progressing   Problem: Coping: Goal: Level of anxiety will decrease Outcome: Progressing   Problem: Pain Managment: Goal: General experience of comfort will improve and/or be controlled Outcome: Progressing   Problem: Safety: Goal: Ability to remain free from injury will improve Outcome: Progressing

## 2024-02-29 NOTE — Progress Notes (Signed)
 Per MD Dgayli to titrate pressors based on cuff measurements.

## 2024-02-29 NOTE — Progress Notes (Signed)
 PHARMACY CONSULT NOTE - FOLLOW UP  Pharmacy Consult for Electrolyte Monitoring and Replacement   Recent Labs: Potassium (mmol/L)  Date Value  02/29/2024 3.7   Magnesium (mg/dL)  Date Value  16/08/9603 1.5 (L)   Calcium (mg/dL)  Date Value  54/07/8118 7.6 (L)   Albumin (g/dL)  Date Value  14/78/2956 3.5  05/18/2020 4.7   Phosphorus (mg/dL)  Date Value  21/30/8657 6.3 (H)   Sodium (mmol/L)  Date Value  02/29/2024 131 (L)     Assessment:  68 year old Caucasian female with a past medical history significant for congestive heart failure with an EF of 35%, history of A. 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 Pharmacy is asked to follow and replace electrolytes while in CCU   Goal of Therapy:  Electrolytes WNL  Plan:  ---2 grams IV magnesium sulfate x 1 per NP ---recheck electrolytes in am  Lowella Bandy ,PharmD Clinical Pharmacist 02/29/2024 7:15 AM

## 2024-02-29 NOTE — Progress Notes (Signed)
 NAME:  SOTIRIA KEAST, MRN:  [id number], DOB:  Jan 30, 1956, LOS: 2 ADMISSION DATE:  02/27/2024, CHIEF COMPLAINT:  Septic Shock   History of Present Illness:   68 yo F presenting to [facility] ED from home for evaluation of diarrhea and vomiting since 02/25/24.   History obtained per chart review and patient bedside report. Patient has had multiple hospitalizations since January 2025 with GI bleeding s/p clipping, watchman device placement and most recently acute CHF exacerbation from 01/26/24- 02/13/24.  During her most recent admission, she required loop diuretics as well as inotrope assistance for adequate diuresis. She was changed from entresto to valsartan due to hypotension and her BB was stopped. She was continued on Eliquis post watchman device per EP recommendations after 6 week TEE on 01/23/24 noted a small flow jet from the LAA back into the LA proper at the edge of the inferior aspect of the occluder device.  She also tested positive for C.Diff completing a 10 day course of PO vancomycin for treatment on 02/10/24.   After discharge she was in her normal state of health until noticing recurrent diarrhea on 02/17/24, which she thought might just be an IBS flare. Diarrhea however worsened on 02/21/24 which got much worse on 3/28 with the addition of persistent non-bloody bilious emesis. Her best friend, who has been helping care for her stated she noticed some blood in her stool that seemed to be from the patient's hemorrhoids. However this bleeding significantly increased on 02/27/24 and the patient has had at least 2 bloody bowel movements. The patient also endorses generalized weakness and fatigue, diffuse abdominal tenderness, blurred vision with green spots. She denies chest pain, dyspnea, urinary symptoms, fever/chills, LOC or palpitations. She did fall about 3 days ago, landing on her buttocks, she denies hitting her head.   She reports continuing to take all her prescribed medications including her  valsartan, torsemide and Eliquis.   ED course: Upon arrival patient significantly hypotensive and drowsy. Sepsis protocol initiated and due to sever hypotension, central venous catheter placed and vasopressor started. Labs significant for hyponatremia, hypochloremia, AGMA, AKI on CKD, elevated Alk Phos, leukocytosis and significant lactic acidosis with fecal occult positive. Small amount of hematochezia noted in ED. Imaging significant for pan colitis without signs of toxic megacolon and cholelithiasis without cholecystis. Medications given: cefepime/vancomycin/flagyl, levophed drip, 2.75 L IVF bolus Initial Vitals: 97.4, 15, 90, 62/35 & 100% on RA  C.Diff testing: Positive   CXR 02/27/24: no acute cardiopulmonary disease CT abdomen/pelvis wo contrast 02/27/24: Diffuse colonic wall thickening and surrounding inflammation compatible with pancolitis. Cholelithiasis.   PCCM consulted for admission due to severe sepsis with shock secondary to recurrent C.Diff colitis requiring vasopressor support.  Pertinent  Medical History   CAD s/p CABG & ASD repair (2005) HFrEF (LVEF 30%) ICM Pulmonary HTN Heart Murmur Atrial Fibrillation s/p Watchman device (11/2023) Hypothyroidism HLD CKD stage 3b (baseline Cr 1.0) HTN Iron deficiency Anemia Angiodysplasia of the intestinal tract Diverticulosis GI bleeding due to AVM s/p clipping (11/2023) OSA on CPAP T2DM TIA Gout Former Smoker (20+ pack history) Severe MR/TR Pancytopenia  Significant Hospital Events: Including procedures, antibiotic start and stop dates in addition to other pertinent events   02/27/24: Admit to ICU with severe sepsis and shock secondary to recurrent C.Diff colitis requiring vasopressor support. 02/28/24: on two pressors, rising lactic acidosis, surgery consulted, underwent colectomy 02/29/24: awake and follows commands off sedation, vasopressor requirements improved   Interim History / Subjective:  Follows commands at bedside,  doing well with SBT  Objective   Blood pressure 123/60, pulse (!) 110, temperature 98.2 F (36.8 C), temperature source Axillary, resp. rate 15, height 5\' 1"  (1.549 m), weight 62.6 kg, SpO2 97%.    Vent Mode: PCV FiO2 (%):  [30 %] 30 % Set Rate:  [18 bmp] 18 bmp Vt Set:  [450 mL] 450 mL PEEP:  [5 cmH20] 5 cmH20 Pressure Support:  [5 cmH20] 5 cmH20 Plateau Pressure:  [11 cmH20-15 cmH20] 11 cmH20   Intake/Output Summary (Last 24 hours) at 02/29/2024 0933 Last data filed at 02/29/2024 0900 Gross per 24 hour  Intake 4494.39 ml  Output 3090 ml  Net 1404.39 ml   Filed Weights   02/28/24 0145 02/28/24 0515 02/29/24 0308  Weight: 61.1 kg 61.3 kg 62.6 kg    Examination: Physical Exam Constitutional:      General: She is not in acute distress.    Appearance: She is ill-appearing.  Cardiovascular:     Rate and Rhythm: Regular rhythm. Tachycardia present.     Pulses: Normal pulses.     Heart sounds: Normal heart sounds.  Pulmonary:     Effort: Pulmonary effort is normal.     Breath sounds: Normal breath sounds.  Abdominal:     Palpations: Abdomen is soft.     Comments: End ileostomy in place  Neurological:     Mental Status: She is alert. She is disoriented.     Motor: Weakness present.       Assessment & Plan:   Neurology #Abdominal Pain #Post operative ventilator requirement  Pain control with Tylenol and lidocaine patch.  Did require analgosedation given remained intubated following surgery. WUA assessment this AM, holding propofol and analgesia.  -IV tylenol every 6 hours -PRN narcotics for severe pain  Cardiovascular #Septic shock #Heart failure with reduced ejection fraction #Atrial fibrillation  #s/p Watchman procedure (11/2023) #CAD status post CABG  Recently admitted to [facility] 2/28 - 3/18 with decompensated heart failure and significant volume overload.  Right heart cath showed RA 26, PA 60/22 (40), PCWP 30, CI 2.0, PVR 2.9 and aggressively diuresed.  Repeat  right heart cath on 3/12 showed elevated pressures but the results are not available. Her echocardiography is showing severe mitral regurgitation and tricuspid regurgitation with plan for outpatient MitraClip and tri-clip placement.  She was discharged on goal-directed medical therapy presents now with hypotension.  Patient with overall presentation that is consistent with septic shock secondary to C. difficile colitis. Given rising lactic acid and pressor requirements she underwent emergent colectomy, now with improvement in perfusion. Does have underlying cardiac dysfunction and we will consult with cardiology today. I will also obtain a co-ox to assess her perfusion.  -continue nor-epi and vasopressin, goal MAP > 65 -trend lactic acid -cardiology consult -hold GDMT -check co-ox today -wean pressors, goal MAP > 65 -continue to trend lactic acid  Pulmonary #Post operative vent requirements  Metabolic acidosis that was compensated. Did undergo colectomy and remained intubated post operatively. Doing well from a pulmonary mechanics stand point, plan for SBT this AM and extubation to nasal cannula.  Gastrointestinal # Severe C. difficile colitis #Hematochezia #Diverticulosis #Colonic AVM  Presents with hematochezia and recurrent diarrhea since Friday.  Patient recently admitted to [facility] where she developed C. difficile colitis treated with oral vancomycin. She's also been admitted to [facility] in January with GI bleed where she was found to have AVM's as well as diverticulosis.  Comes in now with worsening diarrhea and shock with imaging notable  for diffuse pancolitis, abdominal pain, and concern for peritonitis. This is consistent with severe C. Diff colitis with hemodynamic instability and septic shock and has undergone colectomy and end ileostomy.   -surgery consult appreciated -NPO -continue PPI  Renal #AKI #Metabolic acidosis  Presents with severe kidney injury in the setting  of shock as well as continued use of her outpatient GDMT with direct nephrotoxicity as well as pre-renal azotemia and likely ATN.  We have held nephrotoxins and had a sodium bicarbonate infusion given acidosis. This is improved and we will discontinue fluids this AM (pH normalized though metabolic component persists). Mild improvement in kidney function this AM, will hold off on renal consult for now.  -avoid nephrotoxins -goal MAP > 65 mmHg -hold GDMT  Endocrine  ICU glycemic protocol. Will consider hydrocortisone should her shock persist but with improvement in pressors requirements will hold off.  Hem/Onc #Afib on Eliquis #Acute Blood loss anemia  Presents with hematochezia due to colitis. Was on Apixaban for afib as well as for continued anti-coagulation post watchman placement. FFP's administered prior to surgery. Resume heparin gtt today after discussing with surgery.  -monitor CBC  ID # Septic shock #Severe C. difficile colitis  Presents with severe C. difficile colitis with hematochezia and bloody diarrhea.  Previously treated with oral vancomycin during her recent hospitalization at [facility].  Given hemodynamic instability, started on oral vancomycin (500 mg every 6 hours) as well as IV metronidazole. Oral vancomycin held given colectomy for source control and NPO status, continued on IV metronidazole. Continued on broad spectrum antibiotics.   Best Practice (right click and "Reselect all SmartList Selections" daily)   Diet/type: NPO DVT prophylaxis other; active bleed, surgery, held Pressure ulcer(s): N/A GI prophylaxis: PPI Lines: Central line and Arterial Line Foley:  Yes, and it is still needed Code Status:  full code Last date of multidisciplinary goals of care discussion [02/29/2024]  Labs   CBC: Recent Labs  Lab 02/23/24 1446 02/23/24 1446 02/27/24 1901 02/28/24 0156 02/28/24 0608 02/28/24 1451 02/28/24 1528 02/28/24 1749 02/28/24 2333 02/29/24 0431  WBC  7.0  --  36.5* 45.4* 43.1*  --  32.9*  --   --  19.3*  NEUTROABS 5.7  --  33.3*  --   --   --  29.6*  --   --   --   HGB 12.4   < > 12.6 11.3* 11.0* 10.2* 10.2* 10.1* 10.3* 9.3*  HCT 37.6  --  39.2 34.9* 32.9* 30.4* 30.6* 30.1* 30.3* 27.5*  MCV 95.7  --  96.3 98.9 94.8  --  95.0  --   --  94.2  PLT 145*  --  279 303 285  --  268  --   --  209   < > = values in this interval not displayed.    Basic Metabolic Panel: Recent Labs  Lab 02/28/24 0156 02/28/24 0608 02/28/24 1026 02/28/24 1528 02/28/24 2132 02/29/24 0431  NA 123* 124* 130* 130* 129* 131*  K 3.5 3.7 3.3* 3.3* 3.3* 3.7  CL 90* 89* 92* 93* 89* 88*  CO2 7* 9* 10* 14* 15* 16*  GLUCOSE 224* 228* 130* 113* 204* 200*  BUN 86* 84* 84* 82* 80* 76*  CREATININE 6.15* 6.17* 6.19* 5.73* 5.38* 4.90*  CALCIUM 6.3* 6.8* 6.9* 7.5* 7.3* 7.6*  MG 1.5* 1.6*  --   --   --  1.5*  PHOS 8.2* 7.9*  --   --   --  6.3*   GFR: Estimated Creatinine  Clearance: 9.2 mL/min (A) (by C-G formula based on SCr of 4.9 mg/dL (H)). Recent Labs  Lab 02/28/24 0156 02/28/24 0608 02/28/24 0920 02/28/24 1528 02/28/24 1748 02/28/24 2132 02/29/24 0431  PROCALCITON 41.08 38.89  --   --   --   --  17.07  WBC 45.4* 43.1*  --  32.9*  --   --  19.3*  LATICACIDVEN 4.6* 6.4*   < > 5.6* 4.6* 3.4* 2.9*   < > = values in this interval not displayed.    Liver Function Tests: Recent Labs  Lab 02/27/24 1901 02/28/24 0608 02/28/24 1528 02/29/24 0431  AST 40 33 32  --   ALT 19 20 19   --   ALKPHOS 139* 109 106  --   BILITOT 0.9 1.2 0.9  --   PROT 6.6 5.8* 5.8*  --   ALBUMIN 3.1* 2.8* 2.9* 3.5   Recent Labs  Lab 02/27/24 1901  LIPASE 25   No results for input(s): "AMMONIA" in the last 168 hours.  ABG    Component Value Date/Time   PHART 7.39 02/29/2024 0431   PCO2ART 26 (L) 02/29/2024 0431   PO2ART 137 (H) 02/29/2024 0431   HCO3 15.7 (L) 02/29/2024 0431   ACIDBASEDEF 7.6 (H) 02/29/2024 0431   O2SAT 100 02/29/2024 0431     Coagulation  Profile: Recent Labs  Lab 02/28/24 0156 02/28/24 1451 02/29/24 0431  INR 5.0* 3.0* 2.1*    Cardiac Enzymes: Recent Labs  Lab 02/28/24 0156  CKTOTAL 53    HbA1C: Hgb A1c MFr Bld  Date/Time Value Ref Range Status  02/28/2024 06:08 AM 5.9 (H) 4.8 - 5.6 % Final    Comment:    (NOTE) Pre diabetes:          5.7%-6.4%  Diabetes:              >6.4%  Glycemic control for   <7.0% adults with diabetes   06/07/2023 02:58 AM 6.3 (H) 4.8 - 5.6 % Final    Comment:    (NOTE)         Prediabetes: 5.7 - 6.4         Diabetes: >6.4         Glycemic control for adults with diabetes: <7.0     CBG: Recent Labs  Lab 02/28/24 1656 02/28/24 1930 02/28/24 2323 02/29/24 0356 02/29/24 0741  GLUCAP 97 170* 200* 193* 195*    Review of Systems:   N/A  Past Medical History:  She,  has a past medical history of AC (acromioclavicular) joint bone spurs, right, Anemia, Arthritis, Atrial fibrillation (HCC), Cardiac murmur, CHF (congestive heart failure) (HCC), Chronic anticoagulation, Cor triatriatum, Coronary artery disease, Cortical cataract, DOE (dyspnea on exertion), DOE (dyspnea on exertion), GERD (gastroesophageal reflux disease), HFrEF (heart failure with reduced ejection fraction) (HCC), History of shingles (2004), Hyperlipidemia, Hypertension, Hypothyroidism, IBS (irritable bowel syndrome), Ischemic cardiomyopathy, Lumbar scoliosis, Lumbar spinal stenosis, Migraines, Osteoporosis, Pulmonary HTN (HCC), S/P CABG x 3 (01/28/2004), Sleep apnea, T2DM (type 2 diabetes mellitus) (HCC), TIA (transient ischemic attack) (05/29/2016), and Vitamin B 12 deficiency.   Surgical History:   Past Surgical History:  Procedure Laterality Date   CARDIAC CATHETERIZATION     CATARACT EXTRACTION W/ INTRAOCULAR LENS IMPLANT Bilateral    Cataract Extraction with IOL   COLONOSCOPY N/A 12/19/2018   Procedure: COLONOSCOPY;  Surgeon: Pasty Spillers, MD;  Location: [facility] ENDOSCOPY;  Service: Endoscopy;   Laterality: N/A;   COLONOSCOPY WITH PROPOFOL N/A 06/17/2020   Procedure: COLONOSCOPY WITH  PROPOFOL;  Surgeon: Pasty Spillers, MD;  Location: [facility] ENDOSCOPY;  Service: Endoscopy;  Laterality: N/A;   COLONOSCOPY WITH PROPOFOL N/A 03/30/2023   Procedure: COLONOSCOPY WITH PROPOFOL;  Surgeon: Jaynie Collins, DO;  Location: [facility] ENDOSCOPY;  Service: Endoscopy;  Laterality: N/A;   COR TRIATRIATUM REPAIR N/A 01/28/2004   CORONARY ARTERY BYPASS GRAFT N/A 01/28/2004   3v CABG   ESOPHAGOGASTRODUODENOSCOPY N/A 12/19/2018   Procedure: ESOPHAGOGASTRODUODENOSCOPY (EGD);  Surgeon: Pasty Spillers, MD;  Location: [facility] ENDOSCOPY;  Service: Endoscopy;  Laterality: N/A;   ESOPHAGOGASTRODUODENOSCOPY (EGD) WITH PROPOFOL N/A 06/17/2020   Procedure: ESOPHAGOGASTRODUODENOSCOPY (EGD) WITH PROPOFOL;  Surgeon: Pasty Spillers, MD;  Location: [facility] ENDOSCOPY;  Service: Endoscopy;  Laterality: N/A;   ESOPHAGOGASTRODUODENOSCOPY (EGD) WITH PROPOFOL N/A 03/30/2023   Procedure: ESOPHAGOGASTRODUODENOSCOPY (EGD) WITH PROPOFOL;  Surgeon: Jaynie Collins, DO;  Location: [facility] ENDOSCOPY;  Service: Endoscopy;  Laterality: N/A;   RIGHT/LEFT HEART CATH AND CORONARY ANGIOGRAPHY Bilateral 03/29/2017   Procedure: Right/Left Heart Cath and Coronary Angiography;  Surgeon: Dalia Heading, MD;  Location: [facility] INVASIVE CV LAB;  Service: Cardiovascular;  Laterality: Bilateral;   TOTAL KNEE ARTHROPLASTY Right 04/05/2022   Procedure: TOTAL KNEE ARTHROPLASTY;  Surgeon: Kennedy Bucker, MD;  Location: [facility] ORS;  Service: Orthopedics;  Laterality: Right;   TUBAL LIGATION     VENTRAL HERNIA REPAIR N/A 04/21/2017   Procedure: HERNIA REPAIR VENTRAL ADULT;  Surgeon: Nadeen Landau, MD;  Location: [facility] ORS;  Service: General;  Laterality: N/A;     Social History:   reports that she quit smoking about 24 years ago. Her smoking use included cigarettes. She has never used smokeless tobacco. She reports that she does not drink  alcohol and does not use drugs.   Family History:  Her family history includes Breast cancer in her cousin; Cancer in her sister and sister; Diabetes in her father; Valvular heart disease in her mother.   Allergies Allergies  Allergen Reactions   Vioxx [Rofecoxib] Swelling     Home Medications  Prior to Admission medications   Medication Sig Start Date End Date Taking? Authorizing Provider  albuterol (PROVENTIL HFA;VENTOLIN HFA) 108 (90 Base) MCG/ACT inhaler Inhale 2 puffs into the lungs every 6 (six) hours as needed for wheezing or shortness of breath.   Yes [provider]  allopurinol (ZYLOPRIM) 300 MG tablet Take 300 mg by mouth daily.   Yes [provider]  colchicine 0.6 MG tablet Take 0.6-1.8 mg by mouth as needed (take 1.2 mg (2 tablets) at onset of gout flare, Take one additinaltablet one hour later if symptoms persist. Maximum of 3 tablets per day).   Yes [provider]  ELIQUIS 5 MG TABS tablet Take 5 mg by mouth 2 (two) times daily.   Yes [provider]  JARDIANCE 10 MG TABS tablet Take 10 mg by mouth daily.   Yes [provider]  latanoprost (XALATAN) 0.005 % ophthalmic solution Place 1 drop into both eyes at bedtime.   Yes [provider]  levothyroxine (SYNTHROID) 50 MCG tablet Take 50 mcg by mouth daily before breakfast.   Yes [provider]  metFORMIN (GLUCOPHAGE) 1000 MG tablet Take 1,000 mg by mouth 2 (two) times daily with a meal.   Yes [provider]  pantoprazole (PROTONIX) 40 MG tablet Take 40 mg by mouth daily. 01/28/21  Yes [provider]  rosuvastatin (CRESTOR) 5 MG tablet Take 5 mg by mouth daily. 02/20/24 02/19/25 Yes [provider]  spironolactone (ALDACTONE) 25 MG  tablet Take 25 mg by mouth daily.   Yes [provider]  sucralfate (CARAFATE) 1 g tablet Take 1 g by mouth 4 (four) times daily -  with meals and at bedtime.   Yes [provider]  torsemide  (DEMADEX) 20 MG tablet Take 20 mg by mouth 2 (two) times daily. 02/20/24 02/19/25 Yes [provider]  valsartan (DIOVAN) 40 MG tablet Take 20 mg by mouth 2 (two) times daily. 02/20/24 02/19/25 Yes [provider]  vitamin B-12 (CYANOCOBALAMIN) 1000 MCG tablet Take 1,000 mcg by mouth daily.   Yes [provider]     Critical care time: 55 minutes    Raechel Chute, MD [facility] 02/29/2024 9:47 AM

## 2024-02-29 NOTE — TOC Initial Note (Signed)
 Transition of Care [facility]) - Initial/Assessment Note    Patient Details  Name: Tammy Arias MRN: [id number] Date of Birth: [birth year]  Transition of Care [facility]) CM/SW Contact:    Margarito Liner, LCSW Phone Number: 02/29/2024, 11:13 AM  Clinical Narrative:  Per Patient Tammy Arias, patient is active with [facility]. Liaison confirmed she was receiving PT and OT prior to admission. CSW left message for liaison to notify her that ileostomy was placed yesterday. CSW will continue to follow progress.               Expected Discharge Plan: Home w Home Health Services Barriers to Discharge: Continued Medical Work up   Patient Goals and CMS Choice            Expected Discharge Plan and Services       Living arrangements for the past 2 months: Apartment                           HH Arranged: PT, OT HH Agency: [facility] Date [facility] Agency Contacted: 02/29/24   Representative spoke with at [facility] Agency: [name]  Prior Living Arrangements/Services Living arrangements for the past 2 months: Apartment   Patient language and need for interpreter reviewed:: Yes        Need for Family Participation in Patient Care: Yes (Comment)   Current home services: Home PT, Home OT Criminal Activity/Legal Involvement Pertinent to Current Situation/Hospitalization: No - Comment as needed  Activities of Daily Living   ADL Screening (condition at time of admission) Independently performs ADLs?: Yes (appropriate for developmental age) Is the patient deaf or have difficulty hearing?: No Does the patient have difficulty seeing, even when wearing glasses/contacts?: No Does the patient have difficulty concentrating, remembering, or making decisions?: No  Permission Sought/Granted                  Emotional Assessment       Orientation: : Oriented to Self, Oriented to Place Alcohol / Substance Use: Not Applicable Psych Involvement: No (comment)  Admission diagnosis:   Pancolitis (HCC) [K51.00] Severe sepsis with septic shock (CODE) (HCC) [R65.21] Sepsis with acute renal failure and septic shock, due to unspecified organism, unspecified acute renal failure type (HCC) [A41.9, R65.21, N17.9] Patient Active Problem List   Diagnosis Date Noted   Pancolitis (HCC) 02/28/2024   Clostridium difficile colitis 02/28/2024   Sepsis with acute renal failure and septic shock (HCC) 02/28/2024   Severe sepsis with septic shock (CODE) (HCC) 02/27/2024   Chronic diastolic CHF (congestive heart failure) (HCC) 06/07/2023   Lower GI bleed 06/07/2023   Chronic anticoagulation 06/07/2023   Hypotension 06/07/2023   Cirrhosis and Portal hypertension  on CT(HCC) 06/07/2023   Thrombocytopenia (HCC) 06/06/2023   CAD with hx of CABG 06/06/2023   OSA on CPAP 05/23/2023   S/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, right 04/05/2022   S/P TKR (total knee replacement) 04/05/2022   Colon cancer screening    Iron deficiency anemia    Gastric AVM    Barrett's esophagus without dysplasia    Angiodysplasia of intestinal tract    Internal hemorrhoids    Melena 12/17/2018   Pulmonary hypertension (HCC) 03/29/2017   Coronary artery disease involving native coronary artery of native heart with unstable angina pectoris (HCC) 03/21/2017   Ischemic cardiomyopathy 03/21/2017   Hyperlipidemia 10/08/2014   Hypertension 10/08/2014   Type 2 diabetes mellitus (HCC) 10/08/2014   PCP:  Jerl Mina, MD Pharmacy:   [facility] - [place], [place] - [address] [place] [address] Phone: [phone] Fax: [phone]     Social Drivers of Health (SDOH) Social History: SDOH Screenings   Food Insecurity: No Food Insecurity (02/28/2024)  Recent Concern: Food Insecurity - Food Insecurity Present (12/13/2023)   Received from [facility]  Housing: High Risk (02/28/2024)  Transportation Needs: No Transportation Needs (02/28/2024)  Utilities: Not At Risk  (02/28/2024)  Financial Resource Strain: Low Risk  (02/11/2024)   Received from [facility]  Recent Concern: Financial Resource Strain - Medium Risk (12/13/2023)   Received from [facility]  Social Connections: Moderately Integrated (02/28/2024)  Tobacco Use: Medium Risk (02/28/2024)   SDOH Interventions:     Readmission Risk Interventions    02/29/2024   11:10 AM  Readmission Risk Prevention Plan  PCP or Specialist Appt within 3-5 Days Complete  HRI or Home Care Consult Complete  Social Work Consult for Recovery Care Planning/Counseling Complete  Palliative Care Screening Not Applicable

## 2024-02-29 NOTE — Consult Note (Signed)
 [facility] Consult Note:02/29/24     Date of Admission:  02/27/2024           Reason for Consult: AKI    Referring Provider: Raechel Chute, MD Primary Care Provider: Jerl Mina, MD   History of Presenting Illness:  Tammy Arias is a 67 y.o. female with medical problems of coronary artery disease status post CABG, ischemic cardiomyopathy, atrial fibrillation status post Watchman seizure January 2025, pulmonary hypertension who presented to the emergency room initially for diarrhea. A CT of the abdomen and pelvis showed pancolitis.  She was diagnosed with septic shock, C. difficile colitis and possibly ischemic colitis.  She underwent total abdominal colectomy with end ileostomy on 02/28/2024.  Nephrology consult have been requested for evaluation of acute kidney injury.  Her baseline creatinine is 1.15 from June 06, 2023. Admit creatinine of 7.04 which has improved to 4.90 today. Patient is currently requiring norepinephrine and vasopressin as pressors. Urine output of 125 to 150 cc/h as per nursing staff.    Review of Systems: ROS-not available due to patient being critically ill  Past Medical History:  Diagnosis Date   AC (acromioclavicular) joint bone spurs, right    Anemia    Arthritis    Atrial fibrillation (HCC)    a.) CHA2DS2-VASc = 7 (age, sex, HFrEF, HTN, TIA x 2, T2DM). b.) rate/rhythm controlled on oral carvedilol; chronically anticoagulated with warfarin.   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 b.) R/LHC 03/29/2017: small RCA with occluded SVG; no significant Dz. Chronically occluded LAD with patent SVG to D1/LAD. Insignificant Dz in LCx. LM normal.   Cortical cataract    DOE (dyspnea on exertion)    DOE (dyspnea on exertion)    GERD (gastroesophageal reflux disease)    HFrEF  (heart failure with reduced ejection fraction) (HCC)    a.) TTE 10/27/2014: EF 40%; glob HK; mild BAE; triv AR, mild MR/PR, mod TR.  b.) TTE 03/15/2017: EF 30%; glob HK; mod BAE; mod pHTN (RVSP 54.8 mmHg); mild PR, mod MR; sev TR.  c.) R/LHC 03/29/2017: EF 30-35%. d.)TTE 10/25/2018: EF 35%; mod BAE; mod BVE; glob HK; triv PR, mod MR, sev TR; RVSP 57.7 mmHg; G2DD.   History of shingles 2004   Hyperlipidemia    Hypertension    Hypothyroidism    IBS (irritable bowel syndrome)    Ischemic cardiomyopathy    a.) TTE 10/27/2014: EF 40%. b.) TTE 03/15/2017: EF 30%. c.) R/LHC 04/01/2017: EF 30-35%. d.) TTE 10/25/2018: EF 35%   Lumbar scoliosis    Lumbar spinal stenosis    Migraines    Osteoporosis    Pulmonary HTN (HCC)    a.) TTE 03/15/2017: EF 30-35%; RVSP 54.8 mmHg. 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   Sleep apnea    T2DM (type 2 diabetes mellitus) (HCC)    TIA (transient ischemic attack) 05/29/2016   Vitamin B 12 deficiency     Social History   Tobacco Use   Smoking status: Former    Current packs/day: 0.00    Types: Cigarettes    Quit date: 03/30/1999    Years since quitting: 24.9  Smokeless tobacco: Never  Vaping Use   Vaping status: Never Used  Substance Use Topics   Alcohol use: No   Drug use: No    Family History  Problem Relation Age of Onset   Valvular heart disease Mother    Diabetes Father    Cancer Sister        lung   Cancer Sister        cervical   Breast cancer Cousin      OBJECTIVE: Blood pressure 98/68, pulse (!) 104, temperature 98.2 F (36.8 C), temperature source Oral, resp. rate (!) 9, height 5\' 1"  (1.549 m), weight 62.6 kg, SpO2 94%.  Physical Exam General Appearance-frail, elderly, laying in the bed HEENT-moist oral mucous membranes Pulmonary-normal breathing effort Cardiovascular-tachycardic, regular Abdomen-ileostomy Extremities-no  edema Neuro-weak, lethargic Foley catheter in place  - Lab Results Lab Results  Component Value Date   WBC 19.3 (H) 02/29/2024   HGB 8.6 (L) 02/29/2024   HCT 24.9 (L) 02/29/2024   MCV 94.2 02/29/2024   PLT 209 02/29/2024    Lab Results  Component Value Date   CREATININE 4.90 (H) 02/29/2024   BUN 76 (H) 02/29/2024   NA 131 (L) 02/29/2024   K 3.7 02/29/2024   CL 88 (L) 02/29/2024   CO2 16 (L) 02/29/2024    Lab Results  Component Value Date   ALT 19 02/28/2024   AST 32 02/28/2024   ALKPHOS 106 02/28/2024   BILITOT 0.9 02/28/2024     Microbiology: Recent Results (from the past 240 hours)  C Difficile Quick Screen w PCR reflex     Status: Abnormal   Collection Time: 02/27/24  7:34 PM   Specimen: STOOL  Result Value Ref Range Status   C Diff antigen POSITIVE (A) NEGATIVE Final   C Diff toxin POSITIVE (A) NEGATIVE Final   C Diff interpretation Toxin producing C. difficile detected.  Final    Comment: CRITICAL RESULT CALLED TO, READ BACK BY AND VERIFIED WITH: AMANDA FOX RN @2254  02/27/24 ASW Performed at [facility], [address] [place], [place] [address]   Gastrointestinal Panel by PCR , Stool     Status: None   Collection Time: 02/27/24  7:34 PM   Specimen: STOOL  Result Value Ref Range Status   Campylobacter species NOT DETECTED NOT DETECTED Final   Plesimonas shigelloides NOT DETECTED NOT DETECTED Final   Salmonella species NOT DETECTED NOT DETECTED Final   Yersinia enterocolitica NOT DETECTED NOT DETECTED Final   Vibrio species NOT DETECTED NOT DETECTED Final   Vibrio cholerae NOT DETECTED NOT DETECTED Final   Enteroaggregative E coli (EAEC) NOT DETECTED NOT DETECTED Final   Enteropathogenic E coli (EPEC) NOT DETECTED NOT DETECTED Final   Enterotoxigenic E coli (ETEC) NOT DETECTED NOT DETECTED Final   Shiga like toxin producing E coli (STEC) NOT DETECTED NOT DETECTED Final   Shigella/Enteroinvasive E coli (EIEC) NOT DETECTED NOT DETECTED Final    Cryptosporidium NOT DETECTED NOT DETECTED Final   Cyclospora cayetanensis NOT DETECTED NOT DETECTED Final   Entamoeba histolytica NOT DETECTED NOT DETECTED Final   Giardia lamblia NOT DETECTED NOT DETECTED Final   Adenovirus F40/41 NOT DETECTED NOT DETECTED Final   Astrovirus NOT DETECTED NOT DETECTED Final   Norovirus GI/GII NOT DETECTED NOT DETECTED Final   Rotavirus A NOT DETECTED NOT DETECTED Final   Sapovirus (I, II, IV, and V) NOT DETECTED NOT DETECTED Final    Comment: Performed at [facility], [address] [place],  [place] [address]  MRSA Next Gen by PCR, Nasal     Status: None   Collection Time: 02/27/24 10:01 PM   Specimen: Peripheral; Nasal Swab  Result Value Ref Range Status   MRSA by PCR Next Gen NOT DETECTED NOT DETECTED Final    Comment: (NOTE) The GeneXpert MRSA Assay (FDA approved for NASAL specimens only), is one component of a comprehensive MRSA colonization surveillance program. It is not intended to diagnose MRSA infection nor to guide or monitor treatment for MRSA infections. Test performance is not FDA approved in patients less than 67 years old. Performed at [facility], [address] [place], [place] [address]   Culture, blood (Routine X 2) w Reflex to ID Panel     Status: None (Preliminary result)   Collection Time: 02/28/24  2:17 AM   Specimen: BLOOD LEFT HAND  Result Value Ref Range Status   Specimen Description BLOOD LEFT HAND  Final   Special Requests   Final    BOTTLES DRAWN AEROBIC AND ANAEROBIC Blood Culture results may not be optimal due to an inadequate volume of blood received in culture bottles   Culture   Final    NO GROWTH 1 DAY Performed at [facility], [address] [place], [place] [address]    Report Status PENDING  Incomplete  Culture, blood (Routine X 2) w Reflex to ID Panel     Status: None (Preliminary result)   Collection Time: 02/28/24  2:17 AM   Specimen: BLOOD LEFT ARM  Result Value Ref  Range Status   Specimen Description BLOOD LEFT ARM  Final   Special Requests   Final    BOTTLES DRAWN AEROBIC AND ANAEROBIC Blood Culture results may not be optimal due to an inadequate volume of blood received in culture bottles   Culture   Final    NO GROWTH 1 DAY Performed at [facility], [address], [place], [place] [address]    Report Status PENDING  Incomplete    Medications: Scheduled Meds:  sodium chloride   Intravenous Once   Chlorhexidine Gluconate Cloth  6 each Topical Daily   insulin aspart  0-9 Units Subcutaneous Q4H   latanoprost  1 drop Both Eyes QHS   [START ON 03/07/2024] levothyroxine  33 mcg Intravenous Daily   lidocaine  1 patch Transdermal Q0600   rosuvastatin  5 mg Oral Daily   Continuous Infusions:  acetaminophen Stopped (02/29/24 1058)   metronidazole Stopped (02/29/24 1004)   norepinephrine (LEVOPHED) Adult infusion 11 mcg/min (02/29/24 1400)   piperacillin-tazobactam (ZOSYN)  IV Stopped (02/29/24 1338)   vasopressin 0.04 Units/min (02/29/24 1400)   PRN Meds:.albuterol, HYDROmorphone (DILAUDID) injection, mouth rinse  Allergies  Allergen Reactions   Vioxx [Rofecoxib] Swelling    Urinalysis: Recent Labs    02/29/24 0431  COLORURINE YELLOW*  LABSPEC 1.009  PHURINE 6.0  GLUCOSEU 150*  HGBUR LARGE*  BILIRUBINUR NEGATIVE  KETONESUR NEGATIVE  PROTEINUR 30*  NITRITE NEGATIVE  LEUKOCYTESUR NEGATIVE      Imaging: DG Chest Port 1 View Result Date: 02/28/2024 CLINICAL DATA:  Intubated EXAM: PORTABLE CHEST 1 VIEW COMPARISON:  02/27/2024 FINDINGS: Single frontal view of the chest demonstrates endotracheal tube overlying tracheal air column, tip just below thoracic inlet. Enteric catheter passes below diaphragm, tip and side port projecting over the gastric fundus. Right internal jugular catheter tip overlies the right atrium. Abandoned epicardial pacing wires are noted. Occlusion device in the region of the left atrial appendage  unchanged. Cardiac silhouette  is mildly enlarged but stable. No acute airspace disease, effusion, or pneumothorax. No acute bony abnormalities. IMPRESSION: 1. Support devices as above. 2. No acute airspace disease. Electronically Signed   By: Sharlet Salina M.D.   On: 02/28/2024 17:05   CT ABDOMEN PELVIS WO CONTRAST Result Date: 02/27/2024 CLINICAL DATA:  Diarrhea, sepsis, vomiting EXAM: CT ABDOMEN AND PELVIS WITHOUT CONTRAST TECHNIQUE: Multidetector CT imaging of the abdomen and pelvis was performed following the standard protocol without IV contrast. RADIATION DOSE REDUCTION: This exam was performed according to the departmental dose-optimization program which includes automated exposure control, adjustment of the mA and/or kV according to patient size and/or use of iterative reconstruction technique. COMPARISON:  06/06/2023 FINDINGS: Lower chest: Cardiomegaly. Minimal right base linear atelectasis or scarring. No effusions. Hepatobiliary: Layering gallstones within the gallbladder, stable. No focal hepatic abnormality or biliary ductal dilatation. Pancreas: No focal abnormality or ductal dilatation. Spleen: No focal abnormality.  Normal size. Adrenals/Urinary Tract: No adrenal abnormality. No focal renal abnormality. No stones or hydronephrosis. Urinary bladder is unremarkable. Stomach/Bowel: Diffuse colonic wall thickening and surrounding inflammation compatible with pancolitis. Stomach and small bowel decompressed. No bowel obstruction. Vascular/Lymphatic: No evidence of aneurysm or adenopathy. Aortic atherosclerosis. Reproductive: Uterus and adnexa unremarkable.  No mass. Other: No free fluid or free air. Musculoskeletal: No acute bony abnormality. IMPRESSION: Diffuse colonic wall thickening and surrounding inflammation compatible with pancolitis. Cholelithiasis. Aortic atherosclerosis. Electronically Signed   By: Charlett Nose M.D.   On: 02/27/2024 21:42   DG Chest Port 1 View Result Date:  02/27/2024 CLINICAL DATA:  Central line placement EXAM: PORTABLE CHEST 1 VIEW COMPARISON:  12/17/2018 FINDINGS: Right central line tip in the right atrium. No pneumothorax. Prior median sternotomy. Heart is borderline in size. No confluent airspace opacities or effusions. No acute bony abnormality. IMPRESSION: Right central line tip in the right atrium.  No pneumothorax. No acute cardiopulmonary disease. Electronically Signed   By: Charlett Nose M.D.   On: 02/27/2024 20:36      Assessment/Plan:  Tammy Arias is a 68 y.o. female with medical problems of coronary disease status post CABG, ischemic cardiomyopathy, history of atrial fibrillation status post Watchman procedure, pulmonary hypertension   was admitted on 02/27/2024 for :  Pancolitis [facility]) [K51.00] Severe sepsis with septic shock (CODE) (HCC) [R65.21] Sepsis with acute renal failure and septic shock, due to unspecified organism, unspecified acute renal failure type (HCC) [A41.9, R65.21, N17.9]  1.  Acute kidney injury on chronic kidney disease stage IIIa (06/06/2023) AKI likely secondary to severe ATN from concurrent illness Admit creatinine critically elevated at 7.04 which has steadily improved and is down to 4.90 today.  Patient is nonoliguric.  Electrolytes and volume status are acceptable.  No acute indication for dialysis at present.  2.  Severe C. difficile colitis, status post total colectomy and end ileostomy ostomy, septic shock -Currently in recovery phase.  Management as per surgery team.  3.  Chronic systolic CHF, ischemic cardiomyopathy EF 35%, atrial fibrillation, pulmonary hypertension -Management as per cardiology team. Currently requiring pressors-vasopressin and norepinephrine.  4.  Hyponatremia Likely due to AKI and acute illness. Expected to improve as renal function steadily improves.  5.  Hypokalemia Potassium replacement as per ICU/pharmacy protocols.  Mariena Meares Thedore Mins 02/29/24

## 2024-02-29 NOTE — Consult Note (Signed)
 [name] CLINIC CARDIOLOGY CONSULT NOTE       Patient ID: Tammy Arias MRN: [id number] DOB/AGE: [birth year] 68 y.o.  Admit date: 02/27/2024 Referring Physician Dr. Aundria Rud Primary Physician Jerl Mina, MD  Primary Cardiologist Dr. Juliann Pares Reason for Consultation Medication recommendations, chronic heart failure  HPI: Tammy Arias is a 68 y.o. female  with a past medical history of coronary artery disease s/p CABG, ischemic cardiomyopathy, history of atrial fibrillation s/p Watchman, pulmonary hypertension who presented to the ED on 02/27/2024 for diarrhea. Cardiology was consulted for further evaluation.   Patient presents to the ED with diarrhea and generalized weakness for 3 to 4 days due to C. Difficile, noted to have pancolitis and under went total colectomy, and end ileostomy on 02/28/24. Yesterday BNP 580, Troponins trended flat 78 > 89. Labs today notable for sodium 131, potassium 3.7, Mg 1.5, creatinine 4.90, lactic acid down trending at 2.6, Hgb down trending at 8.6 today, WBC at 19.3 which is also downtrending. EKG upon arrival at ED on 4/1 showed atrial fibrillation rate 86 bpm.   At the time of my evaluation patient was resting comfortably laying in her hospital bed.  Patient states she feels okay and denies any chest pain or shortness of breath.  Patient endorses no issues with swelling prior to hospitalization. She was just extubated earlier today. Remains on pressors for BP support. Appears comfortable in bed with no complaints.   Review of systems complete and found to be negative unless listed above    Past Medical History:  Diagnosis Date   AC (acromioclavicular) joint bone spurs, right    Anemia    Arthritis    Atrial fibrillation (HCC)    a.) CHA2DS2-VASc = 7 (age, sex, HFrEF, HTN, TIA x 2, T2DM). b.) rate/rhythm controlled on oral carvedilol; chronically anticoagulated with warfarin.   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 b.) R/LHC 03/29/2017: small RCA with occluded SVG; no significant Dz. Chronically occluded LAD with patent SVG to D1/LAD. Insignificant Dz in LCx. LM normal.   Cortical cataract    DOE (dyspnea on exertion)    DOE (dyspnea on exertion)    GERD (gastroesophageal reflux disease)    HFrEF (heart failure with reduced ejection fraction) (HCC)    a.) TTE 10/27/2014: EF 40%; glob HK; mild BAE; triv AR, mild MR/PR, mod TR.  b.) TTE 03/15/2017: EF 30%; glob HK; mod BAE; mod pHTN (RVSP 54.8 mmHg); mild PR, mod MR; sev TR.  c.) R/LHC 03/29/2017: EF 30-35%. d.)TTE 10/25/2018: EF 35%; mod BAE; mod BVE; glob HK; triv PR, mod MR, sev TR; RVSP 57.7 mmHg; G2DD.   History of shingles 2004   Hyperlipidemia    Hypertension    Hypothyroidism    IBS (irritable bowel syndrome)    Ischemic cardiomyopathy    a.) TTE 10/27/2014: EF 40%. b.) TTE 03/15/2017: EF 30%. c.) R/LHC 04/01/2017: EF 30-35%. d.) TTE 10/25/2018: EF 35%   Lumbar scoliosis    Lumbar spinal stenosis    Migraines    Osteoporosis    Pulmonary HTN (HCC)    a.) TTE 03/15/2017: EF 30-35%; RVSP 54.8 mmHg. b.) R/LHC 03/29/2017: mean PA 33 mmHg, PCWP 22 mmHg, LVEDP 14 mmHg, mean AO 79 mmHg; CO 7.89 L/min, CI 4.61 L/min/m   S/P CABG  x 3 01/28/2004   Sleep apnea    T2DM (type 2 diabetes mellitus) (HCC)    TIA (transient ischemic attack) 05/29/2016   Vitamin B 12 deficiency     Past Surgical History:  Procedure Laterality Date   CARDIAC CATHETERIZATION     CATARACT EXTRACTION W/ INTRAOCULAR LENS IMPLANT Bilateral    Cataract Extraction with IOL   COLONOSCOPY N/A 12/19/2018   Procedure: COLONOSCOPY;  Surgeon: Pasty Spillers, MD;  Location: [facility] ENDOSCOPY;  Service: Endoscopy;  Laterality: N/A;   COLONOSCOPY WITH PROPOFOL N/A 06/17/2020   Procedure: COLONOSCOPY WITH PROPOFOL;  Surgeon: Pasty Spillers, MD;   Location: [facility] ENDOSCOPY;  Service: Endoscopy;  Laterality: N/A;   COLONOSCOPY WITH PROPOFOL N/A 03/30/2023   Procedure: COLONOSCOPY WITH PROPOFOL;  Surgeon: Jaynie Collins, DO;  Location: [facility] ENDOSCOPY;  Service: Endoscopy;  Laterality: N/A;   COR TRIATRIATUM REPAIR N/A 01/28/2004   CORONARY ARTERY BYPASS GRAFT N/A 01/28/2004   3v CABG   ESOPHAGOGASTRODUODENOSCOPY N/A 12/19/2018   Procedure: ESOPHAGOGASTRODUODENOSCOPY (EGD);  Surgeon: Pasty Spillers, MD;  Location[facility] ENDOSCOPY;  Service: Endoscopy;  Laterality: N/A;   ESOPHAGOGASTRODUODENOSCOPY (EGD) WITH PROPOFOL N/A 06/17/2020   Procedure: ESOPHAGOGASTRODUODENOSCOPY (EGD) WITH PROPOFOL;  Surgeon: Pasty Spillers, MD;  Location: [facility] ENDOSCOPY;  Service: Endoscopy;  Laterality: N/A;   ESOPHAGOGASTRODUODENOSCOPY (EGD) WITH PROPOFOL N/A 03/30/2023   Procedure: ESOPHAGOGASTRODUODENOSCOPY (EGD) WITH PROPOFOL;  Surgeon: Jaynie Collins, DO;  Location: [facility] ENDOSCOPY;  Service: Endoscopy;  Laterality: N/A;   RIGHT/LEFT HEART CATH AND CORONARY ANGIOGRAPHY Bilateral 03/29/2017   Procedure: Right/Left Heart Cath and Coronary Angiography;  Surgeon: Dalia Heading, MD;  Location: [facility] INVASIVE CV LAB;  Service: Cardiovascular;  Laterality: Bilateral;   TOTAL KNEE ARTHROPLASTY Right 04/05/2022   Procedure: TOTAL KNEE ARTHROPLASTY;  Surgeon: Kennedy Bucker, MD;  Location: [facility] ORS;  Service: Orthopedics;  Laterality: Right;   TUBAL LIGATION     VENTRAL HERNIA REPAIR N/A 04/21/2017   Procedure: HERNIA REPAIR VENTRAL ADULT;  Surgeon: Nadeen Landau, MD;  Location: [facility] ORS;  Service: General;  Laterality: N/A;    Medications Prior to Admission  Medication Sig Dispense Refill Last Dose/Taking   albuterol (PROVENTIL HFA;VENTOLIN HFA) 108 (90 Base) MCG/ACT inhaler Inhale 2 puffs into the lungs every 6 (six) hours as needed for wheezing or shortness of breath.   Past Week   allopurinol (ZYLOPRIM) 300 MG tablet Take 300 mg by mouth  daily.   02/27/2024   colchicine 0.6 MG tablet Take 0.6-1.8 mg by mouth as needed (take 1.2 mg (2 tablets) at onset of gout flare, Take one additinaltablet one hour later if symptoms persist. Maximum of 3 tablets per day).   02/27/2024   ELIQUIS 5 MG TABS tablet Take 5 mg by mouth 2 (two) times daily.   02/27/2024 Morning   JARDIANCE 10 MG TABS tablet Take 10 mg by mouth daily.   02/27/2024   latanoprost (XALATAN) 0.005 % ophthalmic solution Place 1 drop into both eyes at bedtime.   02/27/2024   levothyroxine (SYNTHROID) 50 MCG tablet Take 50 mcg by mouth daily before breakfast.   02/27/2024   metFORMIN (GLUCOPHAGE) 1000 MG tablet Take 1,000 mg by mouth 2 (two) times daily with a meal.   02/27/2024 Morning   pantoprazole (PROTONIX) 40 MG tablet Take 40 mg by mouth daily.   02/27/2024   rosuvastatin (CRESTOR) 5 MG tablet Take 5 mg by mouth daily.   02/27/2024   spironolactone (ALDACTONE) 25 MG tablet Take 25 mg by mouth  daily.   02/27/2024   sucralfate (CARAFATE) 1 g tablet Take 1 g by mouth 4 (four) times daily -  with meals and at bedtime.   02/27/2024   torsemide (DEMADEX) 20 MG tablet Take 20 mg by mouth 2 (two) times daily.   02/27/2024 Morning   valsartan (DIOVAN) 40 MG tablet Take 20 mg by mouth 2 (two) times daily.   02/27/2024 Morning   vitamin B-12 (CYANOCOBALAMIN) 1000 MCG tablet Take 1,000 mcg by mouth daily.   02/27/2024   Social History   Socioeconomic History   Marital status: Widowed    Spouse name: Not on file   Number of children: Not on file   Years of education: Not on file   Highest education level: Not on file  Occupational History   Occupation: certified nursing assistant  Tobacco Use   Smoking status: Former    Current packs/day: 0.00    Types: Cigarettes    Quit date: 03/30/1999    Years since quitting: 24.9   Smokeless tobacco: Never  Vaping Use   Vaping status: Never Used  Substance and Sexual Activity   Alcohol use: No   Drug use: No   Sexual activity: Not Currently  Other Topics  Concern   Not on file  Social History Narrative   Not on file   Social Drivers of Health   Financial Resource Strain: Low Risk  (02/11/2024)   Received from [facility]   Overall Financial Resource Strain (CARDIA)    Difficulty of Paying Living Expenses: Not very hard  Recent Concern: Financial Resource Strain - Medium Risk (12/13/2023)   Received from [facility]   Overall Financial Resource Strain (CARDIA)    Difficulty of Paying Living Expenses: Somewhat hard  Food Insecurity: No Food Insecurity (02/28/2024)   Hunger Vital Sign    Worried About Running Out of Food in the Last Year: Never true    Ran Out of Food in the Last Year: Never true  Recent Concern: Food Insecurity - Food Insecurity Present (12/13/2023)   Received from [facility]   Hunger Vital Sign    Ran Out of Food in the Last Year: Sometimes true    Worried About Running Out of Food in the Last Year: Never true  Transportation Needs: No Transportation Needs (02/28/2024)   PRAPARE - Administrator, Civil Service (Medical): No    Lack of Transportation (Non-Medical): No  Physical Activity: Not on file  Stress: Not on file  Social Connections: Moderately Integrated (02/28/2024)   Social Connection and Isolation Panel [NHANES]    Frequency of Communication with Friends and Family: Three times a week    Frequency of Social Gatherings with Friends and Family: Three times a week    Attends Religious Services: More than 4 times per year    Active Member of Clubs or Organizations: Yes    Attends Banker Meetings: More than 4 times per year    Marital Status: Widowed  Intimate Partner Violence: Not At Risk (02/28/2024)   Humiliation, Afraid, Rape, and Kick questionnaire    Fear of Current or Ex-Partner: No    Emotionally Abused: No    Physically Abused: No    Sexually Abused: No    Family History  Problem Relation Age of Onset   Valvular heart  disease Mother    Diabetes Father    Cancer Sister        lung   Cancer  Sister        cervical   Breast cancer Cousin      Vitals:   02/29/24 0930 02/29/24 0945 02/29/24 1000 02/29/24 1015  BP: (!) 95/53  123/60   Pulse: (!) 110 (!) 110 (!) 116 (!) 117  Resp: 15 16 19 19   Temp:      TempSrc:      SpO2: 96% 97% 94% 95%  Weight:      Height:        PHYSICAL EXAM General: Ill appearing elderly female, well nourished, in no acute distress. HEENT: Normocephalic and atraumatic. Neck: No JVD.  Lungs: Normal respiratory effort on 3L [place]. Clear bilaterally to auscultation. No wheezes, crackles, rhonchi.  Heart: Irregularly irregular, elevated rate. Normal S1 and S2 without gallops or murmurs.  Abdomen: Non-distended appearing.  Msk: Normal strength and tone for age. Extremities: Warm and well perfused. No clubbing, cyanosis. No edema.  Neuro: Alert and oriented X 3. Psych: Answers questions appropriately.   Labs: Basic Metabolic Panel: Recent Labs    02/28/24 0608 02/28/24 1026 02/28/24 2132 02/29/24 0431  NA 124*   < > 129* 131*  K 3.7   < > 3.3* 3.7  CL 89*   < > 89* 88*  CO2 9*   < > 15* 16*  GLUCOSE 228*   < > 204* 200*  BUN 84*   < > 80* 76*  CREATININE 6.17*   < > 5.38* 4.90*  CALCIUM 6.8*   < > 7.3* 7.6*  MG 1.6*  --   --  1.5*  PHOS 7.9*  --   --  6.3*   < > = values in this interval not displayed.   Liver Function Tests: Recent Labs    02/28/24 0608 02/28/24 1528 02/29/24 0431  AST 33 32  --   ALT 20 19  --   ALKPHOS 109 106  --   BILITOT 1.2 0.9  --   PROT 5.8* 5.8*  --   ALBUMIN 2.8* 2.9* 3.5   Recent Labs    02/27/24 1901  LIPASE 25   CBC: Recent Labs    02/27/24 1901 02/28/24 0156 02/28/24 1528 02/28/24 1749 02/28/24 2333 02/29/24 0431  WBC 36.5*   < > 32.9*  --   --  19.3*  NEUTROABS 33.3*  --  29.6*  --   --   --   HGB 12.6   < > 10.2*   < > 10.3* 9.3*  HCT 39.2   < > 30.6*   < > 30.3* 27.5*  MCV 96.3   < > 95.0  --   --  94.2   PLT 279   < > 268  --   --  209   < > = values in this interval not displayed.   Cardiac Enzymes: Recent Labs    02/28/24 0156 02/28/24 0608  CKTOTAL 53  --   TROPONINIHS 78* 89*   BNP: Recent Labs    02/28/24 0156  BNP 579.8*   D-Dimer: No results for input(s): "DDIMER" in the last 72 hours. Hemoglobin A1C: Recent Labs    02/28/24 0608  HGBA1C 5.9*   Fasting Lipid Panel: Recent Labs    02/29/24 0431  TRIG 563*   Thyroid Function Tests: Recent Labs    02/28/24 0156  TSH 5.143*   Anemia Panel: No results for input(s): "VITAMINB12", "FOLATE", "FERRITIN", "TIBC", "IRON", "RETICCTPCT" in the last 72 hours.   Radiology: [facility] Chest Port 1 View Result Date:  02/28/2024 CLINICAL DATA:  Intubated EXAM: PORTABLE CHEST 1 VIEW COMPARISON:  02/27/2024 FINDINGS: Single frontal view of the chest demonstrates endotracheal tube overlying tracheal air column, tip just below thoracic inlet. Enteric catheter passes below diaphragm, tip and side port projecting over the gastric fundus. Right internal jugular catheter tip overlies the right atrium. Abandoned epicardial pacing wires are noted. Occlusion device in the region of the left atrial appendage unchanged. Cardiac silhouette is mildly enlarged but stable. No acute airspace disease, effusion, or pneumothorax. No acute bony abnormalities. IMPRESSION: 1. Support devices as above. 2. No acute airspace disease. Electronically Signed   By: Sharlet Salina M.D.   On: 02/28/2024 17:05   CT ABDOMEN PELVIS WO CONTRAST Result Date: 02/27/2024 CLINICAL DATA:  Diarrhea, sepsis, vomiting EXAM: CT ABDOMEN AND PELVIS WITHOUT CONTRAST TECHNIQUE: Multidetector CT imaging of the abdomen and pelvis was performed following the standard protocol without IV contrast. RADIATION DOSE REDUCTION: This exam was performed according to the departmental dose-optimization program which includes automated exposure control, adjustment of the mA and/or kV according to patient  size and/or use of iterative reconstruction technique. COMPARISON:  06/06/2023 FINDINGS: Lower chest: Cardiomegaly. Minimal right base linear atelectasis or scarring. No effusions. Hepatobiliary: Layering gallstones within the gallbladder, stable. No focal hepatic abnormality or biliary ductal dilatation. Pancreas: No focal abnormality or ductal dilatation. Spleen: No focal abnormality.  Normal size. Adrenals/Urinary Tract: No adrenal abnormality. No focal renal abnormality. No stones or hydronephrosis. Urinary bladder is unremarkable. Stomach/Bowel: Diffuse colonic wall thickening and surrounding inflammation compatible with pancolitis. Stomach and small bowel decompressed. No bowel obstruction. Vascular/Lymphatic: No evidence of aneurysm or adenopathy. Aortic atherosclerosis. Reproductive: Uterus and adnexa unremarkable.  No mass. Other: No free fluid or free air. Musculoskeletal: No acute bony abnormality. IMPRESSION: Diffuse colonic wall thickening and surrounding inflammation compatible with pancolitis. Cholelithiasis. Aortic atherosclerosis. Electronically Signed   By: Charlett Nose M.D.   On: 02/27/2024 21:42   DG Chest Port 1 View Result Date: 02/27/2024 CLINICAL DATA:  Central line placement EXAM: PORTABLE CHEST 1 VIEW COMPARISON:  12/17/2018 FINDINGS: Right central line tip in the right atrium. No pneumothorax. Prior median sternotomy. Heart is borderline in size. No confluent airspace opacities or effusions. No acute bony abnormality. IMPRESSION: Right central line tip in the right atrium.  No pneumothorax. No acute cardiopulmonary disease. Electronically Signed   By: Charlett Nose M.D.   On: 02/27/2024 20:36    ECHO 01/2024: 1. Severe centrally directed tricuspid regurgitation.  2. Severe mitral regurgitation - two separate jets of centrally directed regurgitation.  3. Moderately reduced LV systolic function. E ~35% 4. Severely reduced RV systolic function.  5. There is a left atrial appendage  occlusive device in place with a gap seen by 2D and peri device leak seen by color Doppler between the inferior aspect of the device and the left atrium.  6. Small, likely iatrogenic atrial septal defect s/p atrial septostomy. By color Doppler, inter-atrial flow appears to be bidirectional.   TELEMETRY reviewed by me 02/29/2024: atrial fibrillation rate 110s  EKG reviewed by me: atrial fibrillation rate 86 bpm  Data reviewed by me 02/29/2024: last 24h vitals tele labs imaging I/O hospitalist progress note, critical care progress note, surgery progress note  Principal Problem:   Severe sepsis with septic shock (CODE) (HCC) Active Problems:   Pancolitis (HCC)   Clostridium difficile colitis   Sepsis with acute renal failure and septic shock (HCC)    ASSESSMENT AND PLAN:  Tammy Arias is a 68  y.o. female  with a past medical history of coronary artery disease s/p CABG, ischemic cardiomyopathy, history of atrial fibrillation s/p Watchman, pulmonary hypertension who presented to the ED on 02/27/2024 for diarrhea. Cardiology was consulted for further evaluation.   # Severe C. Difficilis colitis # s/p total colectomy, and end ileostomy # Septic Shock # AKI Presents to the ED with diarrhea and generalized weakness and found to have C. difficile and underwent surgery on 04/02. Patient currently on Levophed and vasopressin.  -As per general surgery and critical care teams.  #Coronary artery disease s/p CABG # Ischemic Cardiomyopathy (EF 35% - 01/2024) # Hx of Atrial fibrillation s/p Watchman device (01/25) # Pulmonary Hypertension  Patient denies any chest pain or shortness of breath and states she has had no issues with swelling prior to her hospitalization. EKG in the ED and tele showed atrial fibrillation and this is not new for patient.  BNP 580.  Troponins trended flat 78 > 89, consistent with mismatch demand/supply in the setting of septic shock.  BP has been stable today and slightly  tachycardic. - Defer restarting GDMT until pressors are discontinued. Will uptitrate as able. - No indication for diuretics at this time as patient appears euvolemic. - Could consider rhythm control if rate becomes a concern. - Hold home Eliquis for now due to downtrending hemoglobin. Had Watchman in January but repeat TEE demonstrated gap. - Restart home Crestor 5 mg daily.  - No plan for further cardiac diagnostics at this time.   This patient's plan of care was discussed and created with Dr. Isabel Caprice and he is in agreement.  Signed: Gale Journey, PA-C  02/29/2024, 10:28 AM [facility]

## 2024-02-29 NOTE — Progress Notes (Signed)
 [facility] SURGICAL PROGRESS NOTE  Hospital Day(s): 2.   Post op day(s): 1 Day Post-Op.   Interval History:  Patient seen and examined No acute events or new complaints overnight.  Levophed 12 mcg/min, Vasopressin 0.04 units Leukocytosis is improving; WBC 19.3K Hgb to 9.3 Renal function improving; sCr - 4.90; UO - 2160 ccs Hyponatremia to 131 Hypomagnesemia to 1.5 Venous lactate improving; 2.9 this AM NGT in place; output 250 ccs Ileostomy without output  On Zosyn/Flagyl  Vital signs in last 24 hours: [min-max] current  Temp:  [97.1 F (36.2 C)-98.4 F (36.9 C)] 98.4 F (36.9 C) (04/03 0600) Pulse Rate:  [87-121] 108 (04/03 0700) Resp:  [14-28] 22 (04/03 0700) BP: (90-126)/(50-86) 121/61 (04/03 0700) SpO2:  [91 %-98 %] 97 % (04/03 0700) Arterial Line BP: (98-134)/(41-55) 117/50 (04/03 0700) FiO2 (%):  [30 %] 30 % (04/03 0700) Weight:  [62.6 kg] 62.6 kg (04/03 0308)     Height: 5\' 1"  (154.9 cm) Weight: 62.6 kg BMI (Calculated): 26.09   Intake/Output last 2 shifts:  04/02 0701 - 04/03 0700 In: 4344 [I.V.:2505.2; Blood:200; IV Piggyback:1638.8] Out: 2510 [Urine:2160; Emesis/NG output:250; Blood:100]   Physical Exam:  Constitutional: intubated; reacts to painful stimuli Respiratory: on ventilator Cardiovascular: regular rate (fluctuates between 90-100 on exam) and sinus rhythm  Gastrointestinal: soft, non-distended, no rebound/guarding. Ileostomy in RLQ; no output this AM Genitourinary: Foley in place; good UO  Integumentary: Laparotomy is CDI with staples, scant drainage inferiorly  Labs:     Latest Ref Rng & Units 02/29/2024    4:31 AM 02/28/2024   11:33 PM 02/28/2024    5:49 PM  CBC  WBC 4.0 - 10.5 K/uL 19.3     Hemoglobin 12.0 - 15.0 g/dL 9.3  40.1  02.7   Hematocrit 36.0 - 46.0 % 27.5  30.3  30.1   Platelets 150 - 400 K/uL 209         Latest Ref Rng & Units 02/29/2024    4:31 AM 02/28/2024    9:32 PM 02/28/2024    3:28 PM  CMP  Glucose 70 - 99  mg/dL 253  664  403   BUN 8 - 23 mg/dL 76  80  82   Creatinine 0.44 - 1.00 mg/dL 4.74  2.59  5.63   Sodium 135 - 145 mmol/L 131  129  130   Potassium 3.5 - 5.1 mmol/L 3.7  3.3  3.3   Chloride 98 - 111 mmol/L 88  89  93   CO2 22 - 32 mmol/L 16  15  14    Calcium 8.9 - 10.3 mg/dL 7.6  7.3  7.5   Total Protein 6.5 - 8.1 g/dL   5.8   Total Bilirubin 0.0 - 1.2 mg/dL   0.9   Alkaline Phos 38 - 126 U/L   106   AST 15 - 41 U/L   32   ALT 0 - 44 U/L   19     Imaging studies: No new pertinent imaging studies   Assessment/Plan:  68 y.o. female 1 Day Post-Op s/p exploratory laparotomy, total colectomy, and end ileostomy creation for septic shock secondary to C diff colitis   - Fortunately, overall septic picture/shock improving, vasopressor need weaning, labs improved. Appreciate PCCM assistance. Okay to extubate from surgical perspective once amenable  - Okay to remove OG during extubation  - Will likely still need to be NPO; may need to consider TPN in 24-48 hours pending progression. Certainly carries risk of ileus   -  Continue IV Abx (Zosyn, Flagyl); appreciate ID input   - Continue foley catheter; monitor UO - Monitor abdominal examination - Monitor ileostomy output  - Engage WOC RN for ostomy care/teaching   - Monitor leukocytosis; improved significantly - Monitor renal function; improving - Monitor lactic acidosis; improving   - Okay to engage therapies once extubated    All of the above findings and recommendations were discussed with the medical team.  -- Lynden Oxford, PA-C [facility] 02/29/2024, 7:16 AM M-F: 7am - 4pm

## 2024-02-29 NOTE — Anesthesia Postprocedure Evaluation (Signed)
 Anesthesia Post Note  Patient: Tammy Arias  Procedure(s) Performed: LAPAROTOMY, EXPLORATORY (Abdomen) COLECTOMY, TOTAL (Abdomen)  Patient location during evaluation: ICU Anesthesia Type: General Level of consciousness: patient remains intubated per anesthesia plan Respiratory status: patient remains intubated per anesthesia plan Anesthetic complications: no Comments: Intubated, sedated, on pressors.   No notable events documented.   Last Vitals:  Vitals:   02/29/24 0645 02/29/24 0700  BP:  121/61  Pulse: 92 (!) 108  Resp: 18 (!) 22  Temp:    SpO2: 97% 97%    Last Pain:  Vitals:   02/29/24 0600  TempSrc: Oral  PainSc:                  [name]

## 2024-02-29 NOTE — Plan of Care (Signed)
  Problem: Clinical Measurements: Goal: Ability to maintain clinical measurements within normal limits will improve Outcome: Progressing Goal: Respiratory complications will improve Outcome: Progressing Goal: Cardiovascular complication will be avoided Outcome: Progressing   Problem: Activity: Goal: Risk for activity intolerance will decrease Outcome: Progressing   Problem: Pain Managment: Goal: General experience of comfort will improve and/or be controlled Outcome: Progressing   Problem: Safety: Goal: Ability to remain free from injury will improve Outcome: Progressing   Problem: Skin Integrity: Goal: Risk for impaired skin integrity will decrease Outcome: Progressing

## 2024-02-29 NOTE — Progress Notes (Signed)
 PHARMACY CONSULT NOTE - FOLLOW UP  Pharmacy Consult for Electrolyte Monitoring and Replacement   Recent Labs: Potassium (mmol/L)  Date Value  02/29/2024 3.2 (L)   Magnesium (mg/dL)  Date Value  66/44/0347 1.5 (L)   Calcium (mg/dL)  Date Value  42/59/5638 7.7 (L)   Albumin (g/dL)  Date Value  75/64/3329 3.5  05/18/2020 4.7   Phosphorus (mg/dL)  Date Value  51/88/4166 6.3 (H)   Sodium (mmol/L)  Date Value  02/29/2024 132 (L)     Assessment:  68 year old Caucasian female with a past medical history significant for congestive heart failure with an EF of 35%, history of A. 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 Pharmacy is asked to follow and replace electrolytes while in CCU   Goal of Therapy:  Electrolytes WNL  Plan:  Kcl 10 mEq IV x 4.  F/u with AM labs.   Ronnald Ramp ,PharmD Clinical Pharmacist 02/29/2024 5:46 PM

## 2024-02-29 NOTE — Consult Note (Signed)
 WOC Nurse ostomy consult note Pt had ileostomy surgery performed yesterday.  She is critically ill and there is no family present.  Stoma is red and viable, 1 1/4 inches, slightly above skin level.  Applied barrier ring and one piece convex pouch.  No stool or flatus, small amt pink drainage in the pouch.   Educational materials left at the bedside, and ordered 4 sets of supplies to the room for staff nurse's use. Use supplies: barrier ring, Lawson # [phone] and convex Gigi Gin # [phone] Enrolled patient in [facility] DC program: NOT YET. WOC team will begin teaching sessions when Pt is stable and out of ICU.  Thank-you,  Cammie Mcgee MSN, RN, CWOCN, [place], CNS [phone]

## 2024-03-01 DIAGNOSIS — R6521 Severe sepsis with septic shock: Secondary | ICD-10-CM | POA: Diagnosis not present

## 2024-03-01 DIAGNOSIS — A0472 Enterocolitis due to Clostridium difficile, not specified as recurrent: Secondary | ICD-10-CM | POA: Diagnosis not present

## 2024-03-01 DIAGNOSIS — K5100 Ulcerative (chronic) pancolitis without complications: Secondary | ICD-10-CM | POA: Diagnosis not present

## 2024-03-01 DIAGNOSIS — A419 Sepsis, unspecified organism: Secondary | ICD-10-CM | POA: Diagnosis not present

## 2024-03-01 LAB — GLUCOSE, CAPILLARY
Glucose-Capillary: 114 mg/dL — ABNORMAL HIGH (ref 70–99)
Glucose-Capillary: 134 mg/dL — ABNORMAL HIGH (ref 70–99)
Glucose-Capillary: 143 mg/dL — ABNORMAL HIGH (ref 70–99)
Glucose-Capillary: 146 mg/dL — ABNORMAL HIGH (ref 70–99)
Glucose-Capillary: 148 mg/dL — ABNORMAL HIGH (ref 70–99)
Glucose-Capillary: 193 mg/dL — ABNORMAL HIGH (ref 70–99)

## 2024-03-01 LAB — HEMOGLOBIN AND HEMATOCRIT, BLOOD
HCT: 22.7 % — ABNORMAL LOW (ref 36.0–46.0)
HCT: 22.9 % — ABNORMAL LOW (ref 36.0–46.0)
HCT: 24.2 % — ABNORMAL LOW (ref 36.0–46.0)
HCT: 24.6 % — ABNORMAL LOW (ref 36.0–46.0)
Hemoglobin: 7.6 g/dL — ABNORMAL LOW (ref 12.0–15.0)
Hemoglobin: 8 g/dL — ABNORMAL LOW (ref 12.0–15.0)
Hemoglobin: 8.4 g/dL — ABNORMAL LOW (ref 12.0–15.0)
Hemoglobin: 8.5 g/dL — ABNORMAL LOW (ref 12.0–15.0)

## 2024-03-01 LAB — CBC
HCT: 23.9 % — ABNORMAL LOW (ref 36.0–46.0)
Hemoglobin: 8.3 g/dL — ABNORMAL LOW (ref 12.0–15.0)
MCH: 31.6 pg (ref 26.0–34.0)
MCHC: 34.7 g/dL (ref 30.0–36.0)
MCV: 90.9 fL (ref 80.0–100.0)
Platelets: 115 10*3/uL — ABNORMAL LOW (ref 150–400)
RBC: 2.63 MIL/uL — ABNORMAL LOW (ref 3.87–5.11)
RDW: 17.2 % — ABNORMAL HIGH (ref 11.5–15.5)
WBC: 13.5 10*3/uL — ABNORMAL HIGH (ref 4.0–10.5)
nRBC: 0.4 % — ABNORMAL HIGH (ref 0.0–0.2)

## 2024-03-01 LAB — BLOOD GAS, ARTERIAL
Acid-Base Excess: 5 mmol/L — ABNORMAL HIGH (ref 0.0–2.0)
Bicarbonate: 29.1 mmol/L — ABNORMAL HIGH (ref 20.0–28.0)
O2 Saturation: 99.6 %
Patient temperature: 37
pCO2 arterial: 40 mmHg (ref 32–48)
pH, Arterial: 7.47 — ABNORMAL HIGH (ref 7.35–7.45)
pO2, Arterial: 112 mmHg — ABNORMAL HIGH (ref 83–108)

## 2024-03-01 LAB — LACTIC ACID, PLASMA
Lactic Acid, Venous: 1.5 mmol/L (ref 0.5–1.9)
Lactic Acid, Venous: 1.5 mmol/L (ref 0.5–1.9)

## 2024-03-01 LAB — BASIC METABOLIC PANEL WITH GFR
Anion gap: 15 (ref 5–15)
BUN: 68 mg/dL — ABNORMAL HIGH (ref 8–23)
CO2: 25 mmol/L (ref 22–32)
Calcium: 8.4 mg/dL — ABNORMAL LOW (ref 8.9–10.3)
Chloride: 90 mmol/L — ABNORMAL LOW (ref 98–111)
Creatinine, Ser: 3.82 mg/dL — ABNORMAL HIGH (ref 0.44–1.00)
GFR, Estimated: 12 mL/min — ABNORMAL LOW (ref >60)
Glucose, Bld: 148 mg/dL — ABNORMAL HIGH (ref 70–99)
Potassium: 3.6 mmol/L (ref 3.5–5.1)
Sodium: 130 mmol/L — ABNORMAL LOW (ref 135–145)

## 2024-03-01 LAB — PHOSPHORUS: Phosphorus: 4.4 mg/dL (ref 2.5–4.6)

## 2024-03-01 LAB — SURGICAL PATHOLOGY

## 2024-03-01 LAB — MAGNESIUM: Magnesium: 2 mg/dL (ref 1.7–2.4)

## 2024-03-01 MED ORDER — ACETAMINOPHEN 10 MG/ML IV SOLN
1000.0000 mg | Freq: Four times a day (QID) | INTRAVENOUS | Status: AC
  Administered 2024-03-01 – 2024-03-02 (×4): 1000 mg via INTRAVENOUS
  Filled 2024-03-01 (×4): qty 100

## 2024-03-01 MED ORDER — LACTATED RINGERS IV SOLN
INTRAVENOUS | Status: AC
  Administered 2024-03-01: 75 mL/h via INTRAVENOUS

## 2024-03-01 MED ORDER — TRACE MINERALS CU-MN-SE-ZN 300-55-60-3000 MCG/ML IV SOLN
INTRAVENOUS | Status: AC
  Filled 2024-03-01: qty 276.93

## 2024-03-01 NOTE — Progress Notes (Addendum)
 Nutrition Follow Up Note   DOCUMENTATION CODES:   Not applicable  INTERVENTION:  TPN per pharmacy  Recommend thiamine 100mg  IV daily x 5-7 days   Pt at high refeed risk; recommend monitor potassium, magnesium and phosphorus labs daily until stable  Daily weights   Vanilla supplements with diet advancement   NUTRITION DIAGNOSIS:   Inadequate oral intake related to acute illness as evidenced by NPO status -ongoing   GOAL:   Patient will meet greater than or equal to 90% of their needs -not met   MONITOR:   PO intake, Diet advancement, Labs, Weight trends, Skin, I & O's, Other (Comment) (TPN)  ASSESSMENT:   68 y/o female with h/o ischemic cardiomyopathy, gastric AVM, GI angiodysplasia, IBS-D, IDA, s/p watchman device, cholelithiasis, TIA, hypothyroidism, pulmonary hypertension, GERD, barrett's esophagus, HLD, HTN, OSA, CAD s/p CABG x 3, PAF, CHF, cirrhosis, portal hypertension, gout, CKD III, B12 deficiency, ventral hernia s/p mesh repair 2018, colonic angioectasias, diverticulosis, DDD, hiatal hernia, esophageal varices and recent admission to [facility] with C-diff and CHF who is admitted with sepsis, shock, pancolitis, AKI and GIB.  -Pt s/p exploratory laparotomy, total colectomy and end ileostomy 4/3  Pt extubated 4/3. Met with pt in room today. Pt lethargic today but is able to arouse and answer some questions. Pt reports that she is feeling much better. Surgery at bedside and will allow patient to start some liquids by mouth today; pt is asking for water. Pt with poor oral intake for 1 week pta and has now been without adequate nutrition for > 7 days. Plan is to initiate TPN today. Pt is at high refeed risk. Will monitor daily weights as pt with h/o CHF and is at increased risk for volume overload. Pt is agreeable to vanilla supplements once appropriate. Per chart, pt is up ~5lbs since admission. No bowel function noted yet.     Medications reviewed and include: insulin,  synthroid, LRS @75ml /hr, metronidazole, levophed, zosyn  Labs reviewed: Na 130(L), K 3.6 wnl, BUN 68(H), creat 3.82(H), P 4.4 wnl, Mg 2.0 wnl Triglycerides 563(H)- 4/3 Wbc- 13.5(H), Hgb 8.4(L), Hct 24.2(L) Cbgs- 114, 148, 143 x 24 hrs   Diet Order:   Diet Order             Diet NPO time specified Except for: Ice Chips, Sips with Meds  Diet effective now                  EDUCATION NEEDS:   Education needs have been addressed  Skin:  Skin Assessment: Reviewed RN Assessment  Last BM:  4/2- TYPE 6  Height:   Ht Readings from Last 1 Encounters:  02/28/24 5\' 1"  (1.549 m)    Weight:   Wt Readings from Last 1 Encounters:  02/29/24 62.6 kg    Ideal Body Weight:  47.7 kg  BMI:  Body mass index is 26.08 kg/m.  Estimated Nutritional Needs:   Kcal:  1600-1800kcal/day  Protein:  80-90g/day  Fluid:  1.3-1.5L/day  Betsey Holiday MS, RD, LDN If unable to be reached, please send secure chat to "RD inpatient" available from 8:00a-4:00p daily

## 2024-03-01 NOTE — Progress Notes (Signed)
 NAME:  Tammy Arias, MRN:  [id number], DOB:  03-16-1956, LOS: 3 ADMISSION DATE:  02/27/2024, CHIEF COMPLAINT:  Septic Shock   History of Present Illness:   68 yo F presenting to [facility] ED from home for evaluation of diarrhea and vomiting since 02/25/24.   History obtained per chart review and patient bedside report. Patient has had multiple hospitalizations since January 2025 with GI bleeding s/p clipping, watchman device placement and most recently acute CHF exacerbation from 01/26/24- 02/13/24.  During her most recent admission, she required loop diuretics as well as inotrope assistance for adequate diuresis. She was changed from entresto to valsartan due to hypotension and her BB was stopped. She was continued on Eliquis post watchman device per EP recommendations after 6 week TEE on 01/23/24 noted a small flow jet from the LAA back into the LA proper at the edge of the inferior aspect of the occluder device.  She also tested positive for C.Diff completing a 10 day course of PO vancomycin for treatment on 02/10/24.   After discharge she was in her normal state of health until noticing recurrent diarrhea on 02/17/24, which she thought might just be an IBS flare. Diarrhea however worsened on 02/21/24 which got much worse on 3/28 with the addition of persistent non-bloody bilious emesis. Her best friend, who has been helping care for her stated she noticed some blood in her stool that seemed to be from the patient's hemorrhoids. However this bleeding significantly increased on 02/27/24 and the patient has had at least 2 bloody bowel movements. The patient also endorses generalized weakness and fatigue, diffuse abdominal tenderness, blurred vision with green spots. She denies chest pain, dyspnea, urinary symptoms, fever/chills, LOC or palpitations. She did fall about 3 days ago, landing on her buttocks, she denies hitting her head.   She reports continuing to take all her prescribed medications including her  valsartan, torsemide and Eliquis.   ED course: Upon arrival patient significantly hypotensive and drowsy. Sepsis protocol initiated and due to sever hypotension, central venous catheter placed and vasopressor started. Labs significant for hyponatremia, hypochloremia, AGMA, AKI on CKD, elevated Alk Phos, leukocytosis and significant lactic acidosis with fecal occult positive. Small amount of hematochezia noted in ED. Imaging significant for pan colitis without signs of toxic megacolon and cholelithiasis without cholecystis. Medications given: cefepime/vancomycin/flagyl, levophed drip, 2.75 L IVF bolus Initial Vitals: 97.4, 15, 90, 62/35 & 100% on RA  C.Diff testing: Positive   CXR 02/27/24: no acute cardiopulmonary disease CT abdomen/pelvis wo contrast 02/27/24: Diffuse colonic wall thickening and surrounding inflammation compatible with pancolitis. Cholelithiasis.   PCCM consulted for admission due to severe sepsis with shock secondary to recurrent C.Diff colitis requiring vasopressor support.  Pertinent  Medical History   CAD s/p CABG & ASD repair (2005) HFrEF (LVEF 30%) ICM Pulmonary HTN Heart Murmur Atrial Fibrillation s/p Watchman device (11/2023) Hypothyroidism HLD CKD stage 3b (baseline Cr 1.0) HTN Iron deficiency Anemia Angiodysplasia of the intestinal tract Diverticulosis GI bleeding due to AVM s/p clipping (11/2023) OSA on CPAP T2DM TIA Gout Former Smoker (20+ pack history) Severe MR/TR Pancytopenia  Significant Hospital Events: Including procedures, antibiotic start and stop dates in addition to other pertinent events   02/27/24: Admit to ICU with severe sepsis and shock secondary to recurrent C.Diff colitis requiring vasopressor support. 02/28/24: on two pressors, rising lactic acidosis, surgery consulted, underwent colectomy 02/29/24: awake and follows commands off sedation, vasopressor requirements improved, extubated 03/01/24: remains weak but continues to improve, on low  dose  nor-epinephrine   Interim History / Subjective:  Awake, alert and oriented. Reports abdominal pain. No N/V  Objective   Blood pressure (!) 88/58, pulse 95, temperature 98.3 F (36.8 C), temperature source Oral, resp. rate (!) 8, height 5\' 1"  (1.549 m), weight 62.6 kg, SpO2 100%.        Intake/Output Summary (Last 24 hours) at 03/01/2024 1928 Last data filed at 03/01/2024 1200 Gross per 24 hour  Intake 664.4 ml  Output 1535 ml  Net -870.6 ml   Filed Weights   02/28/24 0145 02/28/24 0515 02/29/24 0308  Weight: 61.1 kg 61.3 kg 62.6 kg    Examination: Physical Exam Constitutional:      General: She is not in acute distress.    Appearance: She is ill-appearing.  Cardiovascular:     Rate and Rhythm: Regular rhythm. Tachycardia present.     Pulses: Normal pulses.     Heart sounds: Normal heart sounds.  Pulmonary:     Effort: Pulmonary effort is normal.     Breath sounds: Normal breath sounds.  Abdominal:     Palpations: Abdomen is soft.     Comments: End ileostomy in place  Neurological:     Mental Status: She is alert. She is disoriented.     Motor: Weakness present.       Assessment & Plan:   Neurology #Abdominal Pain #Post operative ventilator requirement  Pain control with Tylenol and lidocaine patch.  Extubated and off analgosedation.  -IV tylenol every 6 hours -PRN narcotics for severe pain  Cardiovascular #Septic shock #Heart failure with reduced ejection fraction #Atrial fibrillation  #s/p Watchman procedure (11/2023) #CAD status post CABG  Recently admitted to [facility] 2/28 - 3/18 with decompensated heart failure and significant volume overload.  Right heart cath showed RA 26, PA 60/22 (40), PCWP 30, CI 2.0, PVR 2.9 and aggressively diuresed.  Repeat right heart cath on 3/12 showed elevated pressures but the results are not available. Her echocardiography is showing severe mitral regurgitation and tricuspid regurgitation with plan for outpatient  MitraClip and tri-clip placement.  She was discharged on goal-directed medical therapy presents now with hypotension.  Patient with overall presentation that is consistent with septic shock secondary to C. difficile colitis. Given rising lactic acid and pressor requirements she underwent emergent colectomy, now with improvement in perfusion. Does have underlying cardiac dysfunction and we will consult with cardiology today. Co-ox re-assuring and argues against cardiogenic shock. Likely fluid shifts secondary to surgery, will initiate IV fluids.  -continue nor-epi and vasopressin, goal MAP > 65 -trend lactic acid > remains normal at 1.5 -cardiology consult -hold GDMT -wean pressors, goal MAP > 65 -continue to trend lactic acid  Pulmonary #Post operative vent requirements  Extubated to nasal cannula, repeat ABG today shows metabolic alkalosis.  Gastrointestinal #Severe C. difficile colitis #Hematochezia #Diverticulosis #Colonic AVM  Presents with hematochezia and recurrent diarrhea since Friday.  Patient recently admitted to [facility] where she developed C. difficile colitis treated with oral vancomycin. She's also been admitted to [facility] in January with GI bleed where she was found to have AVM's as well as diverticulosis.  Comes in now with worsening diarrhea and shock with imaging notable for diffuse pancolitis, abdominal pain, and concern for peritonitis. This is consistent with severe C. Diff colitis with hemodynamic instability and septic shock and has undergone colectomy and end ileostomy. She is improving well post procedure.  -surgery consult appreciated -NPO -continue PPI -TPN per surgery  Renal #AKI #Metabolic acidosis  Presents with  severe kidney injury in the setting of shock as well as continued use of her outpatient GDMT with direct nephrotoxicity as well as pre-renal azotemia and likely ATN. Kidney function continues to improve, with improvement in creatinine and  urine output. Suspect post ATN diuresis, replacing lost fluids with IV LR.  -avoid nephrotoxins -goal MAP > 65 mmHg -hold GDMT -follow nephrology consult  Endocrine  ICU glycemic protocol.  Hem/Onc #Afib on Eliquis #Acute Blood loss anemia  Presents with hematochezia due to colitis. Was on Apixaban for afib as well as for continued anti-coagulation post watchman placement. FFP's administered prior to surgery. Will resume heparin tomorrow.  -monitor CBC  ID # Septic shock #Severe C. difficile colitis  Presents with severe C. difficile colitis with hematochezia and bloody diarrhea.  Previously treated with oral vancomycin during her recent hospitalization at [facility].  Given hemodynamic instability, started on oral vancomycin (500 mg every 6 hours) as well as IV metronidazole. Oral vancomycin held given colectomy for source control and NPO status, continued on IV metronidazole. Continued on broad spectrum antibiotics.   Best Practice (right click and "Reselect all SmartList Selections" daily)   Diet/type: NPO DVT prophylaxis other; active bleed, surgery, held Pressure ulcer(s): N/A GI prophylaxis: PPI Lines: Central line and Arterial Line Foley:  Yes, and it is still needed Code Status:  full code Last date of multidisciplinary goals of care discussion [03/01/2024]  Labs   CBC: Recent Labs  Lab 02/27/24 1901 02/28/24 0156 02/28/24 0608 02/28/24 1451 02/28/24 1528 02/28/24 1749 02/29/24 0431 02/29/24 1137 02/29/24 1748 03/01/24 0013 03/01/24 0452 03/01/24 0701 03/01/24 1310  WBC 36.5* 45.4* 43.1*  --  32.9*  --  19.3*  --   --   --  13.5*  --   --   NEUTROABS 33.3*  --   --   --  29.6*  --   --   --   --   --   --   --   --   HGB 12.6 11.3* 11.0*   < > 10.2*   < > 9.3*   < > 8.1* 8.5* 8.3* 8.4* 8.0*  HCT 39.2 34.9* 32.9*   < > 30.6*   < > 27.5*   < > 23.5* 24.6* 23.9* 24.2* 22.7*  MCV 96.3 98.9 94.8  --  95.0  --  94.2  --   --   --  90.9  --   --   PLT 279 303 285   --  268  --  209  --   --   --  115*  --   --    < > = values in this interval not displayed.    Basic Metabolic Panel: Recent Labs  Lab 02/28/24 0156 02/28/24 0608 02/28/24 1026 02/28/24 1528 02/28/24 2132 02/29/24 0431 02/29/24 1652 03/01/24 0452  NA 123* 124*   < > 130* 129* 131* 132* 130*  K 3.5 3.7   < > 3.3* 3.3* 3.7 3.2* 3.6  CL 90* 89*   < > 93* 89* 88* 89* 90*  CO2 7* 9*   < > 14* 15* 16* 25 25  GLUCOSE 224* 228*   < > 113* 204* 200* 147* 148*  BUN 86* 84*   < > 82* 80* 76* 74* 68*  CREATININE 6.15* 6.17*   < > 5.73* 5.38* 4.90* 4.17* 3.82*  CALCIUM 6.3* 6.8*   < > 7.5* 7.3* 7.6* 7.7* 8.4*  MG 1.5* 1.6*  --   --   --  1.5*  --  2.0  PHOS 8.2* 7.9*  --   --   --  6.3*  --  4.4   < > = values in this interval not displayed.   GFR: Estimated Creatinine Clearance: 11.8 mL/min (A) (by C-G formula based on SCr of 3.82 mg/dL (H)). Recent Labs  Lab 02/28/24 0156 02/28/24 0608 02/28/24 0920 02/28/24 1528 02/28/24 1748 02/29/24 0431 02/29/24 1044 02/29/24 1309 03/01/24 0452 03/01/24 1055 03/01/24 1310  PROCALCITON 41.08 38.89  --   --   --  17.07  --   --   --   --   --   WBC 45.4* 43.1*  --  32.9*  --  19.3*  --   --  13.5*  --   --   LATICACIDVEN 4.6* 6.4*   < > 5.6*   < > 2.9* 2.6* 2.7*  --  1.5 1.5   < > = values in this interval not displayed.    Liver Function Tests: Recent Labs  Lab 02/27/24 1901 02/28/24 0608 02/28/24 1528 02/29/24 0431  AST 40 33 32  --   ALT 19 20 19   --   ALKPHOS 139* 109 106  --   BILITOT 0.9 1.2 0.9  --   PROT 6.6 5.8* 5.8*  --   ALBUMIN 3.1* 2.8* 2.9* 3.5   Recent Labs  Lab 02/27/24 1901  LIPASE 25   No results for input(s): "AMMONIA" in the last 168 hours.  ABG    Component Value Date/Time   PHART 7.47 (H) 03/01/2024 1055   PCO2ART 40 03/01/2024 1055   PO2ART 112 (H) 03/01/2024 1055   HCO3 29.1 (H) 03/01/2024 1055   ACIDBASEDEF 2.7 (H) 02/29/2024 0952   O2SAT 99.6 03/01/2024 1055     Coagulation  Profile: Recent Labs  Lab 02/28/24 0156 02/28/24 1451 02/29/24 0431  INR 5.0* 3.0* 2.1*    Cardiac Enzymes: Recent Labs  Lab 02/28/24 0156  CKTOTAL 53    HbA1C: Hgb A1c MFr Bld  Date/Time Value Ref Range Status  02/28/2024 06:08 AM 5.9 (H) 4.8 - 5.6 % Final    Comment:    (NOTE) Pre diabetes:          5.7%-6.4%  Diabetes:              >6.4%  Glycemic control for   <7.0% adults with diabetes   06/07/2023 02:58 AM 6.3 (H) 4.8 - 5.6 % Final    Comment:    (NOTE)         Prediabetes: 5.7 - 6.4         Diabetes: >6.4         Glycemic control for adults with diabetes: <7.0     CBG: Recent Labs  Lab 02/29/24 2334 03/01/24 0348 03/01/24 0839 03/01/24 1104 03/01/24 1558  GLUCAP 162* 143* 148* 114* 134*    Review of Systems:   N/A  Past Medical History:  She,  has a past medical history of AC (acromioclavicular) joint bone spurs, right, Anemia, Arthritis, Atrial fibrillation (HCC), Cardiac murmur, CHF (congestive heart failure) (HCC), Chronic anticoagulation, Cor triatriatum, Coronary artery disease, Cortical cataract, DOE (dyspnea on exertion), DOE (dyspnea on exertion), GERD (gastroesophageal reflux disease), HFrEF (heart failure with reduced ejection fraction) (HCC), History of shingles (2004), Hyperlipidemia, Hypertension, Hypothyroidism, IBS (irritable bowel syndrome), Ischemic cardiomyopathy, Lumbar scoliosis, Lumbar spinal stenosis, Migraines, Osteoporosis, Pulmonary HTN (HCC), S/P CABG x 3 (01/28/2004), Sleep apnea, T2DM (type 2 diabetes mellitus) (HCC), TIA (transient ischemic attack) (05/29/2016),  and Vitamin B 12 deficiency.   Surgical History:   Past Surgical History:  Procedure Laterality Date   CARDIAC CATHETERIZATION     CATARACT EXTRACTION W/ INTRAOCULAR LENS IMPLANT Bilateral    Cataract Extraction with IOL   COLECTOMY  02/28/2024   Procedure: COLECTOMY, TOTAL;  Surgeon: Campbell Lerner, MD;  Location: [facility] ORS;  Service: General;;   COLONOSCOPY  N/A 12/19/2018   Procedure: COLONOSCOPY;  Surgeon: Pasty Spillers, MD;  Location: [facility] ENDOSCOPY;  Service: Endoscopy;  Laterality: N/A;   COLONOSCOPY WITH PROPOFOL N/A 06/17/2020   Procedure: COLONOSCOPY WITH PROPOFOL;  Surgeon: Pasty Spillers, MD;  Location: [facility] ENDOSCOPY;  Service: Endoscopy;  Laterality: N/A;   COLONOSCOPY WITH PROPOFOL N/A 03/30/2023   Procedure: COLONOSCOPY WITH PROPOFOL;  Surgeon: Jaynie Collins, DO;  Location: [facility] ENDOSCOPY;  Service: Endoscopy;  Laterality: N/A;   COR TRIATRIATUM REPAIR N/A 01/28/2004   CORONARY ARTERY BYPASS GRAFT N/A 01/28/2004   3v CABG   ESOPHAGOGASTRODUODENOSCOPY N/A 12/19/2018   Procedure: ESOPHAGOGASTRODUODENOSCOPY (EGD);  Surgeon: Pasty Spillers, MD;  Location: [facility] ENDOSCOPY;  Service: Endoscopy;  Laterality: N/A;   ESOPHAGOGASTRODUODENOSCOPY (EGD) WITH PROPOFOL N/A 06/17/2020   Procedure: ESOPHAGOGASTRODUODENOSCOPY (EGD) WITH PROPOFOL;  Surgeon: Pasty Spillers, MD;  Location: [facility] ENDOSCOPY;  Service: Endoscopy;  Laterality: N/A;   ESOPHAGOGASTRODUODENOSCOPY (EGD) WITH PROPOFOL N/A 03/30/2023   Procedure: ESOPHAGOGASTRODUODENOSCOPY (EGD) WITH PROPOFOL;  Surgeon: Jaynie Collins, DO;  Location: [facility] ENDOSCOPY;  Service: Endoscopy;  Laterality: N/A;   LAPAROTOMY N/A 02/28/2024   Procedure: LAPAROTOMY, EXPLORATORY;  Surgeon: Campbell Lerner, MD;  Location: [facility] ORS;  Service: General;  Laterality: N/A;   RIGHT/LEFT HEART CATH AND CORONARY ANGIOGRAPHY Bilateral 03/29/2017   Procedure: Right/Left Heart Cath and Coronary Angiography;  Surgeon: Dalia Heading, MD;  Location: [facility] INVASIVE CV LAB;  Service: Cardiovascular;  Laterality: Bilateral;   TOTAL KNEE ARTHROPLASTY Right 04/05/2022   Procedure: TOTAL KNEE ARTHROPLASTY;  Surgeon: Kennedy Bucker, MD;  Location: [facility] ORS;  Service: Orthopedics;  Laterality: Right;   TUBAL LIGATION     VENTRAL HERNIA REPAIR N/A 04/21/2017   Procedure: HERNIA REPAIR VENTRAL ADULT;   Surgeon: Nadeen Landau, MD;  Location: [facility] ORS;  Service: General;  Laterality: N/A;     Social History:   reports that she quit smoking about 24 years ago. Her smoking use included cigarettes. She has never used smokeless tobacco. She reports that she does not drink alcohol and does not use drugs.   Family History:  Her family history includes Breast cancer in her cousin; Cancer in her sister and sister; Diabetes in her father; Valvular heart disease in her mother.   Allergies Allergies  Allergen Reactions   Vioxx [Rofecoxib] Swelling     Home Medications  Prior to Admission medications   Medication Sig Start Date End Date Taking? Authorizing Provider  albuterol (PROVENTIL HFA;VENTOLIN HFA) 108 (90 Base) MCG/ACT inhaler Inhale 2 puffs into the lungs every 6 (six) hours as needed for wheezing or shortness of breath.   Yes [provider]  allopurinol (ZYLOPRIM) 300 MG tablet Take 300 mg by mouth daily.   Yes [provider]  colchicine 0.6 MG tablet Take 0.6-1.8 mg by mouth as needed (take 1.2 mg (2 tablets) at onset of gout flare, Take one additinaltablet one hour later if symptoms persist. Maximum of 3 tablets per day).   Yes [provider]  ELIQUIS 5 MG TABS tablet Take 5 mg by mouth 2 (two) times daily.   Yes [provider]  JARDIANCE 10 MG TABS tablet Take 10 mg by mouth daily.   Yes [provider]  latanoprost (XALATAN) 0.005 % ophthalmic solution Place 1 drop into both eyes at bedtime.   Yes [provider]  levothyroxine (SYNTHROID) 50 MCG tablet Take 50 mcg by mouth daily before breakfast.   Yes [provider]  metFORMIN (GLUCOPHAGE) 1000 MG tablet Take 1,000 mg by mouth 2 (two) times daily with a meal.   Yes [provider]  pantoprazole (PROTONIX) 40 MG tablet Take 40 mg by mouth daily. 01/28/21  Yes [provider]  rosuvastatin (CRESTOR) 5 MG tablet Take 5 mg by mouth daily. 02/20/24  02/19/25 Yes [provider]  spironolactone (ALDACTONE) 25 MG tablet Take 25 mg by mouth daily.   Yes [provider]  sucralfate (CARAFATE) 1 g tablet Take 1 g by mouth 4 (four) times daily -  with meals and at bedtime.   Yes [provider]  torsemide (DEMADEX) 20 MG tablet Take 20 mg by mouth 2 (two) times daily. 02/20/24 02/19/25 Yes [provider]  valsartan (DIOVAN) 40 MG tablet Take 20 mg by mouth 2 (two) times daily. 02/20/24 02/19/25 Yes [provider]  vitamin B-12 (CYANOCOBALAMIN) 1000 MCG tablet Take 1,000 mcg by mouth daily.   Yes [provider]     Critical care time: 37 minutes    Raechel Chute, MD [facility] 03/01/2024 7:33 PM

## 2024-03-01 NOTE — Progress Notes (Signed)
 [facility] CLINIC CARDIOLOGY PROGRESS NOTE       Patient ID: Tammy Arias MRN: [id number] DOB/AGE: [birth year] 68 y.o.  Admit date: 02/27/2024 Referring Physician Dr. Aundria Rud Primary Physician Jerl Mina, MD  Primary Cardiologist Dr. Juliann Pares Reason for Consultation Medication recommendations, chronic heart failure  HPI: Tammy MORNEAULT is a 68 y.o. female  with a past medical history of coronary artery disease s/p CABG, ischemic cardiomyopathy, history of atrial fibrillation s/p Watchman, pulmonary hypertension who presented to the ED on 02/27/2024 for diarrhea. Cardiology was consulted for further evaluation.   Interval history: -Patient seen and examined this morning, resting comfortably in bed. -Levophed is currently being weaned off.  Remains on vasopressin. -Remains in atrial fibrillation with relatively controlled rates in the 90-100s  Review of systems complete and found to be negative unless listed above    Past Medical History:  Diagnosis Date   AC (acromioclavicular) joint bone spurs, right    Anemia    Arthritis    Atrial fibrillation (HCC)    a.) CHA2DS2-VASc = 7 (age, sex, HFrEF, HTN, TIA x 2, T2DM). b.) rate/rhythm controlled on oral carvedilol; chronically anticoagulated with warfarin.   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 b.) R/LHC 03/29/2017: small RCA with occluded SVG; no significant Dz. Chronically occluded LAD with patent SVG to D1/LAD. Insignificant Dz in LCx. LM normal.   Cortical cataract    DOE (dyspnea on exertion)    DOE (dyspnea on exertion)    GERD (gastroesophageal reflux disease)    HFrEF (heart failure with reduced ejection fraction) (HCC)    a.) TTE 10/27/2014: EF 40%; glob HK; mild BAE; triv AR, mild MR/PR, mod TR.  b.) TTE 03/15/2017: EF 30%; glob HK; mod BAE; mod pHTN (RVSP 54.8  mmHg); mild PR, mod MR; sev TR.  c.) R/LHC 03/29/2017: EF 30-35%. d.)TTE 10/25/2018: EF 35%; mod BAE; mod BVE; glob HK; triv PR, mod MR, sev TR; RVSP 57.7 mmHg; G2DD.   History of shingles 2004   Hyperlipidemia    Hypertension    Hypothyroidism    IBS (irritable bowel syndrome)    Ischemic cardiomyopathy    a.) TTE 10/27/2014: EF 40%. b.) TTE 03/15/2017: EF 30%. c.) R/LHC 04/01/2017: EF 30-35%. d.) TTE 10/25/2018: EF 35%   Lumbar scoliosis    Lumbar spinal stenosis    Migraines    Osteoporosis    Pulmonary HTN (HCC)    a.) TTE 03/15/2017: EF 30-35%; RVSP 54.8 mmHg. 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   Sleep apnea    T2DM (type 2 diabetes mellitus) (HCC)    TIA (transient ischemic attack) 05/29/2016   Vitamin B 12 deficiency     Past Surgical History:  Procedure Laterality Date   CARDIAC CATHETERIZATION     CATARACT EXTRACTION W/ INTRAOCULAR LENS IMPLANT Bilateral    Cataract Extraction with IOL   COLECTOMY  02/28/2024   Procedure: COLECTOMY, TOTAL;  Surgeon: Campbell Lerner, MD;  Location: [facility] ORS;  Service: General;;   COLONOSCOPY N/A 12/19/2018   Procedure: COLONOSCOPY;  Surgeon: Pasty Spillers, MD;  Location: [facility] ENDOSCOPY;  Service: Endoscopy;  Laterality: N/A;   COLONOSCOPY WITH PROPOFOL  N/A 06/17/2020   Procedure: COLONOSCOPY WITH PROPOFOL;  Surgeon: Pasty Spillers, MD;  Location: [facility] ENDOSCOPY;  Service: Endoscopy;  Laterality: N/A;   COLONOSCOPY WITH PROPOFOL N/A 03/30/2023   Procedure: COLONOSCOPY WITH PROPOFOL;  Surgeon: Jaynie Collins, DO;  Location: [facility] ENDOSCOPY;  Service: Endoscopy;  Laterality: N/A;   COR TRIATRIATUM REPAIR N/A 01/28/2004   CORONARY ARTERY BYPASS GRAFT N/A 01/28/2004   3v CABG   ESOPHAGOGASTRODUODENOSCOPY N/A 12/19/2018   Procedure: ESOPHAGOGASTRODUODENOSCOPY (EGD);  Surgeon: Pasty Spillers, MD;  Location: [facility]  ENDOSCOPY;  Service: Endoscopy;  Laterality: N/A;   ESOPHAGOGASTRODUODENOSCOPY (EGD) WITH PROPOFOL N/A 06/17/2020   Procedure: ESOPHAGOGASTRODUODENOSCOPY (EGD) WITH PROPOFOL;  Surgeon: Pasty Spillers, MD;  Location: [facility] ENDOSCOPY;  Service: Endoscopy;  Laterality: N/A;   ESOPHAGOGASTRODUODENOSCOPY (EGD) WITH PROPOFOL N/A 03/30/2023   Procedure: ESOPHAGOGASTRODUODENOSCOPY (EGD) WITH PROPOFOL;  Surgeon: Jaynie Collins, DO;  Location: [facility] ENDOSCOPY;  Service: Endoscopy;  Laterality: N/A;   LAPAROTOMY N/A 02/28/2024   Procedure: LAPAROTOMY, EXPLORATORY;  Surgeon: Campbell Lerner, MD;  Location: [facility] ORS;  Service: General;  Laterality: N/A;   RIGHT/LEFT HEART CATH AND CORONARY ANGIOGRAPHY Bilateral 03/29/2017   Procedure: Right/Left Heart Cath and Coronary Angiography;  Surgeon: Dalia Heading, MD;  Location: [facility] INVASIVE CV LAB;  Service: Cardiovascular;  Laterality: Bilateral;   TOTAL KNEE ARTHROPLASTY Right 04/05/2022   Procedure: TOTAL KNEE ARTHROPLASTY;  Surgeon: Kennedy Bucker, MD;  Location: [facility] ORS;  Service: Orthopedics;  Laterality: Right;   TUBAL LIGATION     VENTRAL HERNIA REPAIR N/A 04/21/2017   Procedure: HERNIA REPAIR VENTRAL ADULT;  Surgeon: Nadeen Landau, MD;  Location: [facility] ORS;  Service: General;  Laterality: N/A;    Medications Prior to Admission  Medication Sig Dispense Refill Last Dose/Taking   albuterol (PROVENTIL HFA;VENTOLIN HFA) 108 (90 Base) MCG/ACT inhaler Inhale 2 puffs into the lungs every 6 (six) hours as needed for wheezing or shortness of breath.   Past Week   allopurinol (ZYLOPRIM) 300 MG tablet Take 300 mg by mouth daily.   02/27/2024   colchicine 0.6 MG tablet Take 0.6-1.8 mg by mouth as needed (take 1.2 mg (2 tablets) at onset of gout flare, Take one additinaltablet one hour later if symptoms persist. Maximum of 3 tablets per day).   02/27/2024   ELIQUIS 5 MG TABS tablet Take 5 mg by mouth 2 (two) times daily.   02/27/2024 Morning   JARDIANCE 10 MG  TABS tablet Take 10 mg by mouth daily.   02/27/2024   latanoprost (XALATAN) 0.005 % ophthalmic solution Place 1 drop into both eyes at bedtime.   02/27/2024   levothyroxine (SYNTHROID) 50 MCG tablet Take 50 mcg by mouth daily before breakfast.   02/27/2024   metFORMIN (GLUCOPHAGE) 1000 MG tablet Take 1,000 mg by mouth 2 (two) times daily with a meal.   02/27/2024 Morning   pantoprazole (PROTONIX) 40 MG tablet Take 40 mg by mouth daily.   02/27/2024   rosuvastatin (CRESTOR) 5 MG tablet Take 5 mg by mouth daily.   02/27/2024   spironolactone (ALDACTONE) 25 MG tablet Take 25 mg by mouth daily.   02/27/2024   sucralfate (CARAFATE) 1 g tablet Take 1 g by mouth 4 (four) times daily -  with meals and at bedtime.   02/27/2024   torsemide (DEMADEX) 20 MG tablet Take 20 mg by mouth 2 (two) times daily.   02/27/2024 Morning   valsartan (DIOVAN) 40 MG tablet Take 20 mg by mouth 2 (two) times daily.  02/27/2024 Morning   vitamin B-12 (CYANOCOBALAMIN) 1000 MCG tablet Take 1,000 mcg by mouth daily.   02/27/2024   Social History   Socioeconomic History   Marital status: Widowed    Spouse name: Not on file   Number of children: Not on file   Years of education: Not on file   Highest education level: Not on file  Occupational History   Occupation: certified nursing assistant  Tobacco Use   Smoking status: Former    Current packs/day: 0.00    Types: Cigarettes    Quit date: 03/30/1999    Years since quitting: 24.9   Smokeless tobacco: Never  Vaping Use   Vaping status: Never Used  Substance and Sexual Activity   Alcohol use: No   Drug use: No   Sexual activity: Not Currently  Other Topics Concern   Not on file  Social History Narrative   Not on file   Social Drivers of Health   Financial Resource Strain: Low Risk  (02/11/2024)   Received from [facility]   Overall Financial Resource Strain (CARDIA)    Difficulty of Paying Living Expenses: Not very hard  Recent Concern: Financial Resource Strain  - Medium Risk (12/13/2023)   Received from [facility]   Overall Financial Resource Strain (CARDIA)    Difficulty of Paying Living Expenses: Somewhat hard  Food Insecurity: No Food Insecurity (02/28/2024)   Hunger Vital Sign    Worried About Running Out of Food in the Last Year: Never true    Ran Out of Food in the Last Year: Never true  Recent Concern: Food Insecurity - Food Insecurity Present (12/13/2023)   Received from [facility]   Hunger Vital Sign    Ran Out of Food in the Last Year: Sometimes true    Worried About Running Out of Food in the Last Year: Never true  Transportation Needs: No Transportation Needs (02/28/2024)   PRAPARE - Administrator, Civil Service (Medical): No    Lack of Transportation (Non-Medical): No  Physical Activity: Not on file  Stress: Not on file  Social Connections: Moderately Integrated (02/28/2024)   Social Connection and Isolation Panel [NHANES]    Frequency of Communication with Friends and Family: Three times a week    Frequency of Social Gatherings with Friends and Family: Three times a week    Attends Religious Services: More than 4 times per year    Active Member of Clubs or Organizations: Yes    Attends Banker Meetings: More than 4 times per year    Marital Status: Widowed  Intimate Partner Violence: Not At Risk (02/28/2024)   Humiliation, Afraid, Rape, and Kick questionnaire    Fear of Current or Ex-Partner: No    Emotionally Abused: No    Physically Abused: No    Sexually Abused: No    Family History  Problem Relation Age of Onset   Valvular heart disease Mother    Diabetes Father    Cancer Sister        lung   Cancer Sister        cervical   Breast cancer Cousin      Vitals:   03/01/24 0815 03/01/24 0830 03/01/24 0845 03/01/24 0900  BP: (!) 95/57 (!) 91/51 (!) 77/52 (!) 71/42  Pulse: 89 96 95 97  Resp: (!) 7 18 20 14   Temp:      TempSrc:      SpO2: 99% 99%  99% 98%   Weight:      Height:        PHYSICAL EXAM General: Ill appearing elderly female, well nourished, in no acute distress. HEENT: Normocephalic and atraumatic. Neck: No JVD.  Lungs: Normal respiratory effort on 2L [place]. Clear bilaterally to auscultation. No wheezes, crackles, rhonchi.  Heart: Irregularly irregular, elevated rate. Normal S1 and S2 without gallops or murmurs.  Abdomen: Non-distended appearing.  Msk: Normal strength and tone for age. Extremities: Warm and well perfused. No clubbing, cyanosis. No edema.  Neuro: Alert and oriented X 3. Psych: Answers questions appropriately.   Labs: Basic Metabolic Panel: Recent Labs    02/29/24 0431 02/29/24 1652 03/01/24 0452  NA 131* 132* 130*  K 3.7 3.2* 3.6  CL 88* 89* 90*  CO2 16* 25 25  GLUCOSE 200* 147* 148*  BUN 76* 74* 68*  CREATININE 4.90* 4.17* 3.82*  CALCIUM 7.6* 7.7* 8.4*  MG 1.5*  --  2.0  PHOS 6.3*  --  4.4   Liver Function Tests: Recent Labs    02/28/24 0608 02/28/24 1528 02/29/24 0431  AST 33 32  --   ALT 20 19  --   ALKPHOS 109 106  --   BILITOT 1.2 0.9  --   PROT 5.8* 5.8*  --   ALBUMIN 2.8* 2.9* 3.5   Recent Labs    02/27/24 1901  LIPASE 25   CBC: Recent Labs    02/27/24 1901 02/28/24 0156 02/28/24 1528 02/28/24 1749 02/29/24 0431 02/29/24 1137 03/01/24 0452 03/01/24 0701  WBC 36.5*   < > 32.9*  --  19.3*  --  13.5*  --   NEUTROABS 33.3*  --  29.6*  --   --   --   --   --   HGB 12.6   < > 10.2*   < > 9.3*   < > 8.3* 8.4*  HCT 39.2   < > 30.6*   < > 27.5*   < > 23.9* 24.2*  MCV 96.3   < > 95.0  --  94.2  --  90.9  --   PLT 279   < > 268  --  209  --  115*  --    < > = values in this interval not displayed.   Cardiac Enzymes: Recent Labs    02/28/24 0156 02/28/24 0608  CKTOTAL 53  --   TROPONINIHS 78* 89*   BNP: Recent Labs    02/28/24 0156  BNP 579.8*   D-Dimer: No results for input(s): "DDIMER" in the last 72 hours. Hemoglobin A1C: Recent Labs    02/28/24 0608   HGBA1C 5.9*   Fasting Lipid Panel: Recent Labs    02/29/24 0431  TRIG 563*   Thyroid Function Tests: Recent Labs    02/28/24 0156  TSH 5.143*  T3FREE 1.3*   Anemia Panel: No results for input(s): "VITAMINB12", "FOLATE", "FERRITIN", "TIBC", "IRON", "RETICCTPCT" in the last 72 hours.   Radiology: [facility] Chest Port 1 View Result Date: 02/28/2024 CLINICAL DATA:  Intubated EXAM: PORTABLE CHEST 1 VIEW COMPARISON:  02/27/2024 FINDINGS: Single frontal view of the chest demonstrates endotracheal tube overlying tracheal air column, tip just below thoracic inlet. Enteric catheter passes below diaphragm, tip and side port projecting over the gastric fundus. Right internal jugular catheter tip overlies the right atrium. Abandoned epicardial pacing wires are noted. Occlusion device in the region of the left atrial appendage unchanged. Cardiac silhouette is mildly enlarged but stable. No acute airspace disease, effusion,  or pneumothorax. No acute bony abnormalities. IMPRESSION: 1. Support devices as above. 2. No acute airspace disease. Electronically Signed   By: Sharlet Salina M.D.   On: 02/28/2024 17:05   CT ABDOMEN PELVIS WO CONTRAST Result Date: 02/27/2024 CLINICAL DATA:  Diarrhea, sepsis, vomiting EXAM: CT ABDOMEN AND PELVIS WITHOUT CONTRAST TECHNIQUE: Multidetector CT imaging of the abdomen and pelvis was performed following the standard protocol without IV contrast. RADIATION DOSE REDUCTION: This exam was performed according to the departmental dose-optimization program which includes automated exposure control, adjustment of the mA and/or kV according to patient size and/or use of iterative reconstruction technique. COMPARISON:  06/06/2023 FINDINGS: Lower chest: Cardiomegaly. Minimal right base linear atelectasis or scarring. No effusions. Hepatobiliary: Layering gallstones within the gallbladder, stable. No focal hepatic abnormality or biliary ductal dilatation. Pancreas: No focal abnormality or ductal  dilatation. Spleen: No focal abnormality.  Normal size. Adrenals/Urinary Tract: No adrenal abnormality. No focal renal abnormality. No stones or hydronephrosis. Urinary bladder is unremarkable. Stomach/Bowel: Diffuse colonic wall thickening and surrounding inflammation compatible with pancolitis. Stomach and small bowel decompressed. No bowel obstruction. Vascular/Lymphatic: No evidence of aneurysm or adenopathy. Aortic atherosclerosis. Reproductive: Uterus and adnexa unremarkable.  No mass. Other: No free fluid or free air. Musculoskeletal: No acute bony abnormality. IMPRESSION: Diffuse colonic wall thickening and surrounding inflammation compatible with pancolitis. Cholelithiasis. Aortic atherosclerosis. Electronically Signed   By: Charlett Nose M.D.   On: 02/27/2024 21:42   DG Chest Port 1 View Result Date: 02/27/2024 CLINICAL DATA:  Central line placement EXAM: PORTABLE CHEST 1 VIEW COMPARISON:  12/17/2018 FINDINGS: Right central line tip in the right atrium. No pneumothorax. Prior median sternotomy. Heart is borderline in size. No confluent airspace opacities or effusions. No acute bony abnormality. IMPRESSION: Right central line tip in the right atrium.  No pneumothorax. No acute cardiopulmonary disease. Electronically Signed   By: Charlett Nose M.D.   On: 02/27/2024 20:36    ECHO 01/2024: 1. Severe centrally directed tricuspid regurgitation.  2. Severe mitral regurgitation - two separate jets of centrally directed regurgitation.  3. Moderately reduced LV systolic function. E ~35% 4. Severely reduced RV systolic function.  5. There is a left atrial appendage occlusive device in place with a gap seen by 2D and peri device leak seen by color Doppler between the inferior aspect of the device and the left atrium.  6. Small, likely iatrogenic atrial septal defect s/p atrial septostomy. By color Doppler, inter-atrial flow appears to be bidirectional.   TELEMETRY reviewed by me 03/01/2024: atrial fibrillation  rate 90-100s  EKG reviewed by me: atrial fibrillation rate 86 bpm  Data reviewed by me 03/01/2024: last 24h vitals tele labs imaging I/O hospitalist progress note, critical care progress note, surgery progress note  Principal Problem:   Severe sepsis with septic shock (CODE) (HCC) Active Problems:   Pancolitis (HCC)   Clostridium difficile colitis   Sepsis with acute renal failure and septic shock (HCC)    ASSESSMENT AND PLAN:  RALYN STLAURENT is a 68 y.o. female  with a past medical history of coronary artery disease s/p CABG, ischemic cardiomyopathy, history of atrial fibrillation s/p Watchman, pulmonary hypertension who presented to the ED on 02/27/2024 for diarrhea. Cardiology was consulted for further evaluation.   # Severe C. Difficilis colitis # s/p total colectomy, and end ileostomy # Septic Shock # AKI Presents to the ED with diarrhea and generalized weakness and found to have C. difficile and underwent surgery on 04/02. Patient currently on Levophed and vasopressin.  -  As per general surgery and critical care teams.  #Coronary artery disease s/p CABG # Ischemic Cardiomyopathy (EF 35% - 01/2024) # Hx of Atrial fibrillation s/p Watchman device (01/25) # Pulmonary Hypertension  Patient denies any chest pain or shortness of breath and states she has had no issues with swelling prior to her hospitalization. EKG in the ED and tele showed atrial fibrillation and this is not new for patient.  BNP 580.  Troponins trended flat 78 > 89, consistent with mismatch demand/supply in the setting of septic shock.  BP has been stable today and slightly tachycardic. -Defer restarting GDMT until pressors are discontinued.  -No indication for diuretics at this time as patient appears euvolemic. -Could consider rhythm control if rate becomes a concern. -Hold home Eliquis for now due to downtrending hemoglobin. Had Watchman in January but repeat TEE demonstrated gap. -No plan for further cardiac  diagnostics at this time.   This patient's plan of care was discussed and created with Dr. Isabel Caprice and he is in agreement.  Signed: Gale Journey, PA-C  03/01/2024, 9:25 AM [facility]

## 2024-03-01 NOTE — Progress Notes (Signed)
 Cdiff colitis with septic shock  s/p colectomy Leukemoid reaction resolved  Leucocytosis much better Po vanco was dc On IV flagyl/zosyn for a few days Discussed with Dr.Dgayli ID will sign off- call if needed

## 2024-03-01 NOTE — Progress Notes (Signed)
 [facility] [place], [place] 03/01/24  Subjective:   Hospital day # 3  Tammy Arias is a 68 y.o. female with medical problems of coronary disease status post CABG, ischemic cardiomyopathy, history of atrial fibrillation status post Watchman procedure, pulmonary hypertension   was admitted on 02/27/2024 for severe C. difficile colitis and she is status post total colectomy and end ileostomy.  Hospital course is complicated by septic shock.  Patient remains critically ill.  Currently on nasal cannula oxygen.  Requiring norepinephrine as pressors.  Also requiring fluid supplementation with lactated Ringer.  She has a wet sounding cough. Urine output is good. Serum creatinine has improved compared to yesterday.   Objective:  Vital signs in last 24 hours:  Temp:  [98 F (36.7 C)-98.9 F (37.2 C)] 98.9 F (37.2 C) (04/04 1300) Pulse Rate:  [76-118] 99 (04/04 1515) Resp:  [0-24] 9 (04/04 1515) BP: (66-139)/(40-105) 89/54 (04/04 1500) SpO2:  [92 %-100 %] 100 % (04/04 1515) Arterial Line BP: (55-127)/(32-59) 106/49 (04/04 1515)  Weight change:  Filed Weights   02/28/24 0145 02/28/24 0515 02/29/24 0308  Weight: 61.1 kg 61.3 kg 62.6 kg    Intake/Output:    Intake/Output Summary (Last 24 hours) at 03/01/2024 1539 Last data filed at 03/01/2024 1200 Gross per 24 hour  Intake 1972.27 ml  Output 1910 ml  Net 62.27 ml     Physical Exam: General: Chronically ill-appearing, thin built, laying in the bed  HEENT Moist oral mucous membranes  Pulm/lungs Coarse breath sounds bilaterally, [place] O2  CVS/Heart Irregular, tachycardic  Abdomen:  Soft  Extremities: No edema  Neurologic: Able to follow simple commands  Skin: No acute rashes   Foley catheter in place       Basic Metabolic Panel:  Recent Labs  Lab 02/28/24 0156 02/28/24 0608 02/28/24 1026 02/28/24 1528 02/28/24 2132 02/29/24 0431 02/29/24 1652 03/01/24 0452  NA 123* 124*   < > 130* 129* 131* 132*  130*  K 3.5 3.7   < > 3.3* 3.3* 3.7 3.2* 3.6  CL 90* 89*   < > 93* 89* 88* 89* 90*  CO2 7* 9*   < > 14* 15* 16* 25 25  GLUCOSE 224* 228*   < > 113* 204* 200* 147* 148*  BUN 86* 84*   < > 82* 80* 76* 74* 68*  CREATININE 6.15* 6.17*   < > 5.73* 5.38* 4.90* 4.17* 3.82*  CALCIUM 6.3* 6.8*   < > 7.5* 7.3* 7.6* 7.7* 8.4*  MG 1.5* 1.6*  --   --   --  1.5*  --  2.0  PHOS 8.2* 7.9*  --   --   --  6.3*  --  4.4   < > = values in this interval not displayed.     CBC: Recent Labs  Lab 02/27/24 1901 02/28/24 0156 02/28/24 0608 02/28/24 1451 02/28/24 1528 02/28/24 1749 02/29/24 0431 02/29/24 1137 02/29/24 1748 03/01/24 0013 03/01/24 0452 03/01/24 0701 03/01/24 1310  WBC 36.5* 45.4* 43.1*  --  32.9*  --  19.3*  --   --   --  13.5*  --   --   NEUTROABS 33.3*  --   --   --  29.6*  --   --   --   --   --   --   --   --   HGB 12.6 11.3* 11.0*   < > 10.2*   < > 9.3*   < > 8.1* 8.5* 8.3* 8.4*  8.0*  HCT 39.2 34.9* 32.9*   < > 30.6*   < > 27.5*   < > 23.5* 24.6* 23.9* 24.2* 22.7*  MCV 96.3 98.9 94.8  --  95.0  --  94.2  --   --   --  90.9  --   --   PLT 279 303 285  --  268  --  209  --   --   --  115*  --   --    < > = values in this interval not displayed.      Lab Results  Component Value Date   HEPBSAG Negative 12/19/2018      Microbiology:  Recent Results (from the past 240 hours)  C Difficile Quick Screen w PCR reflex     Status: Abnormal   Collection Time: 02/27/24  7:34 PM   Specimen: STOOL  Result Value Ref Range Status   C Diff antigen POSITIVE (A) NEGATIVE Final   C Diff toxin POSITIVE (A) NEGATIVE Final   C Diff interpretation Toxin producing C. difficile detected.  Final    Comment: CRITICAL RESULT CALLED TO, READ BACK BY AND VERIFIED WITH: AMANDA FOX RN @2254  02/27/24 ASW Performed at [facility], [address] [place], [place] [address]   Gastrointestinal Panel by PCR , Stool     Status: None   Collection Time: 02/27/24  7:34 PM   Specimen: STOOL   Result Value Ref Range Status   Campylobacter species NOT DETECTED NOT DETECTED Final   Plesimonas shigelloides NOT DETECTED NOT DETECTED Final   Salmonella species NOT DETECTED NOT DETECTED Final   Yersinia enterocolitica NOT DETECTED NOT DETECTED Final   Vibrio species NOT DETECTED NOT DETECTED Final   Vibrio cholerae NOT DETECTED NOT DETECTED Final   Enteroaggregative E coli (EAEC) NOT DETECTED NOT DETECTED Final   Enteropathogenic E coli (EPEC) NOT DETECTED NOT DETECTED Final   Enterotoxigenic E coli (ETEC) NOT DETECTED NOT DETECTED Final   Shiga like toxin producing E coli (STEC) NOT DETECTED NOT DETECTED Final   Shigella/Enteroinvasive E coli (EIEC) NOT DETECTED NOT DETECTED Final   Cryptosporidium NOT DETECTED NOT DETECTED Final   Cyclospora cayetanensis NOT DETECTED NOT DETECTED Final   Entamoeba histolytica NOT DETECTED NOT DETECTED Final   Giardia lamblia NOT DETECTED NOT DETECTED Final   Adenovirus F40/41 NOT DETECTED NOT DETECTED Final   Astrovirus NOT DETECTED NOT DETECTED Final   Norovirus GI/GII NOT DETECTED NOT DETECTED Final   Rotavirus A NOT DETECTED NOT DETECTED Final   Sapovirus (I, II, IV, and V) NOT DETECTED NOT DETECTED Final    Comment: Performed at [facility], [address] [place], [place] [address]  MRSA Next Gen by PCR, Nasal     Status: None   Collection Time: 02/27/24 10:01 PM   Specimen: Peripheral; Nasal Swab  Result Value Ref Range Status   MRSA by PCR Next Gen NOT DETECTED NOT DETECTED Final    Comment: (NOTE) The GeneXpert MRSA Assay (FDA approved for NASAL specimens only), is one component of a comprehensive MRSA colonization surveillance program. It is not intended to diagnose MRSA infection nor to guide or monitor treatment for MRSA infections. Test performance is not FDA approved in patients less than 79 years old. Performed at [facility], [address] [place], [place] [address]   Culture, blood (Routine X 2) w  Reflex to ID Panel     Status: None (Preliminary result)   Collection Time: 02/28/24  2:17 AM   Specimen: BLOOD LEFT HAND  Result Value Ref Range Status   Specimen Description BLOOD LEFT HAND  Final   Special Requests   Final    BOTTLES DRAWN AEROBIC AND ANAEROBIC Blood Culture results may not be optimal due to an inadequate volume of blood received in culture bottles   Culture   Final    NO GROWTH 2 DAYS Performed at [facility], [address], [place], [place] [address]    Report Status PENDING  Incomplete  Culture, blood (Routine X 2) w Reflex to ID Panel     Status: None (Preliminary result)   Collection Time: 02/28/24  2:17 AM   Specimen: BLOOD LEFT ARM  Result Value Ref Range Status   Specimen Description BLOOD LEFT ARM  Final   Special Requests   Final    BOTTLES DRAWN AEROBIC AND ANAEROBIC Blood Culture results may not be optimal due to an inadequate volume of blood received in culture bottles   Culture   Final    NO GROWTH 2 DAYS Performed at [facility], [address] [place], [place] [address]    Report Status PENDING  Incomplete    Coagulation Studies: Recent Labs    02/28/24 0156 02/28/24 1451 02/29/24 0431  LABPROT 46.9* 31.8* 24.1*  INR 5.0* 3.0* 2.1*    Urinalysis: Recent Labs    02/29/24 0431  COLORURINE YELLOW*  LABSPEC 1.009  PHURINE 6.0  GLUCOSEU 150*  HGBUR LARGE*  BILIRUBINUR NEGATIVE  KETONESUR NEGATIVE  PROTEINUR 30*  NITRITE NEGATIVE  LEUKOCYTESUR NEGATIVE      Imaging: No results found.   Medications:    acetaminophen 1,000 mg (03/01/24 1044)   lactated ringers 75 mL/hr (03/01/24 1058)   metronidazole 500 mg (03/01/24 1104)   norepinephrine (LEVOPHED) Adult infusion 7 mcg/min (03/01/24 1101)   piperacillin-tazobactam (ZOSYN)  IV 2.25 g (03/01/24 1443)   TPN ADULT (ION)      sodium chloride   Intravenous Once   Chlorhexidine Gluconate Cloth  6 each Topical Daily   insulin aspart  0-9 Units  Subcutaneous Q4H   latanoprost  1 drop Both Eyes QHS   [START ON 03/07/2024] levothyroxine  33 mcg Intravenous Daily   lidocaine  1 patch Transdermal Q0600   albuterol, HYDROmorphone (DILAUDID) injection, mouth rinse  Assessment/ Plan:  68 y.o. female with  medical problems of coronary disease status post CABG, ischemic cardiomyopathy, history of atrial fibrillation status post Watchman procedure, pulmonary hypertension  admitted on 02/27/2024 for Pancolitis (HCC) [K51.00] Severe sepsis with septic shock (CODE) (HCC) [R65.21] Sepsis with acute renal failure and septic shock, due to unspecified organism, unspecified acute renal failure type (HCC) [A41.9, R65.21, N17.9]   1.  Acute kidney injury on chronic kidney disease stage IIIa (06/06/2023) AKI likely secondary to severe ATN from concurrent illness Admit creatinine critically elevated at 7.04 which has steadily improved and is down to 3.82 today.  Patient is nonoliguric.  Electrolytes and volume status are acceptable.  No acute indication for dialysis at present.   2.  Severe C. difficile colitis, status post total colectomy and end ileostomy, septic shock -Currently in recovery phase.  Management as per surgery team.   3.  Chronic systolic CHF, ischemic cardiomyopathy EF 35%, atrial fibrillation, pulmonary hypertension -Management as per cardiology team. Currently requiring pressors- norepinephrine.   4.  Hyponatremia Likely due to AKI and acute illness. Expected to improve as renal function steadily improves.   5.  Hypokalemia Potassium replacement as  per ICU/pharmacy protocols.    LOS: 3 Laren Whaling Thedore Mins 4/4/20253:39 PM  [facility] [place], [place] [phone]  Note: This note was prepared with Dragon dictation. Any transcription errors are unintentional

## 2024-03-01 NOTE — Progress Notes (Signed)
 PHARMACY - TOTAL PARENTERAL NUTRITION CONSULT NOTE   Indication: Prolonged ileus  Patient Measurements: Height: 5\' 1"  (154.9 cm) Weight: 62.6 kg (138 lb 0.1 oz) IBW/kg (Calculated) : 47.8 TPN AdjBW (KG): 51.1 Body mass index is 26.08 kg/m.  Assessment: 68 y/o female with h/o ischemic cardiomyopathy, gastric AVM, GI angiodysplasia, IBS-D, IDA, s/p watchman device, cholelithiasis, TIA, hypothyroidism, pulmonary hypertension, GERD, barrett's esophagus, HLD, HTN, OSA, CAD s/p CABG x 3, PAF, CHF, cirrhosis, portal hypertension, gout, CKD III, B12 deficiency, ventral hernia s/p mesh repair 2018, colonic angioectasias, diverticulosis, DDD, hiatal hernia, esophageal varices and recent admission to [facility] with C-diff and CHF who is admitted with sepsis, pancolitis, AKI and GIB.   Glucose / Insulin:  --previous 24h: BG 147 - 195 required 10 units SSI (a1C5.9) Electrolytes:  --hyponatremia: improving Renal: SCr: 7.04 > 3.82 Hepatic: LFTs, bilirubin wnl at baseline --TG notably elevated but was on propofol at that time, now off Intake / Output  04/03 0701 - 04/04 0700 In: 2547.2 [I.V.:636.5; IV Piggyback:1910.7] Out: 2595 [Urine:2595]  MIVF: lactated ringers at 75 mL/hr x 1 liter GI Imaging:No new pertinent imaging studies  GI Surgeries / Procedures:  04/02 exploratory laparotomy, total colectomy, and end ileostomy creation   Central access: 03/01/24 (pending) TPN start date: 03/01/24   Nutritional Goals: Goal TPN rate is 65 mL/hr (provides 90 g of protein and 1801 kcals per day)  RD Assessment: Estimated Needs Total Energy Estimated Needs: 1600-1800kcal/day Total Protein Estimated Needs: 90-105g/day Total Fluid Estimated Needs: 1.3-1.5L/day  Current Nutrition:  NPO  Plan:  ---Start TPN at 83mL/hr at 1800 ---Electrolytes in TPN (standard): Na 58mEq/L, K 74mEq/L, Ca 3mEq/L, Mg 92mEq/L, and Phos 63mmol/L. Cl:Ac 1:1 ---Add standard MVI, thiamine 100mg  and trace elements to  TPN ---continue Sensitive q4h SSI and adjust as needed  ---Monitor TPN labs on Mon/Thurs, daily until stable  Lowella Bandy 03/01/2024,9:53 AM

## 2024-03-01 NOTE — Progress Notes (Addendum)
 0800 Patient awakens easily. Confused on day and time. Re orients quickly. Moved around in bed. Discussed need for Incentive Spirometry. Patient HOH but communicates well.  0900 Patient's best friend in to visit. Patient very alert and talking with visitor. Explained how to splint abdomen when coughing. Demonstrated how-patient demonstrated back.  1100 Dr. Claudine Mouton in to see patient. 1300 Turned to left side. Small amount of red drainage noted leaking around ostomy bag. Dressing reinforced with ABDs. Dr. Claudine Mouton notified of drainage. 1500 No new drainage noted..1800 Patient resting quietly.

## 2024-03-01 NOTE — Progress Notes (Addendum)
 [facility] SURGICAL PROGRESS NOTE  Hospital Day(s): 3.   Post op day(s): 2 Days Post-Op.   Interval History:  Patient seen and examined No acute events or new complaints overnight.  She is on 3 mcg/min levophed & 0.04 units vasopressin She arouses appropriately Reports abdomen "does not feel good."  Leukocytosis is improving; WBC 13.5K Hgb to 8.3 - stable Renal function improving; sCr - 3.82; UO - 2595 ccs Hyponatremia to 130 Hypomagnesemia to 1.5 Venous lactate improving; 2.9 this AM NGT in place; output 250 ccs Ileostomy without output  On Zosyn/Flagyl  Vital signs in last 24 hours: [min-max] current  Temp:  [98 F (36.7 C)-98.6 F (37 C)] 98 F (36.7 C) (04/04 0400) Pulse Rate:  [76-118] 110 (04/04 0700) Resp:  [6-27] 13 (04/04 0700) BP: (74-139)/(36-105) 114/64 (04/04 0700) SpO2:  [87 %-100 %] 99 % (04/04 0700) Arterial Line BP: (60-127)/(33-59) 118/54 (04/04 0700) FiO2 (%):  [30 %] 30 % (04/03 0800)     Height: 5\' 1"  (154.9 cm) Weight: 62.6 kg BMI (Calculated): 26.09   Intake/Output last 2 shifts:  04/03 0701 - 04/04 0700 In: 2547.2 [I.V.:636.5; IV Piggyback:1910.7] Out: 2595 [Urine:2595]   Physical Exam:  Constitutional: Arouses appropriately, NAD Respiratory: On [place], no respiratory distress Cardiovascular: tachycardic to 105 bpm and irregular Gastrointestinal: soft, expected tenderness, non-distended, no rebound/guarding. Ileostomy in RLQ; no output this AM Genitourinary: Foley in place; good UO  Integumentary: Laparotomy is CDI with staples, drainage inferiorly   Labs:     Latest Ref Rng & Units 03/01/2024    7:01 AM 03/01/2024    4:52 AM 03/01/2024   12:13 AM  CBC  WBC 4.0 - 10.5 K/uL  13.5    Hemoglobin 12.0 - 15.0 g/dL 8.4  8.3  8.5   Hematocrit 36.0 - 46.0 % 24.2  23.9  24.6   Platelets 150 - 400 K/uL  115        Latest Ref Rng & Units 03/01/2024    4:52 AM 02/29/2024    4:52 PM 02/29/2024    4:31 AM  CMP  Glucose 70 - 99 mg/dL 098   119  147   BUN 8 - 23 mg/dL 68  74  76   Creatinine 0.44 - 1.00 mg/dL 8.29  5.62  1.30   Sodium 135 - 145 mmol/L 130  132  131   Potassium 3.5 - 5.1 mmol/L 3.6  3.2  3.7   Chloride 98 - 111 mmol/L 90  89  88   CO2 22 - 32 mmol/L 25  25  16    Calcium 8.9 - 10.3 mg/dL 8.4  7.7  7.6     Imaging studies: No new pertinent imaging studies   Assessment/Plan:  68 y.o. female 2 Days Post-Op s/p exploratory laparotomy, total colectomy, and end ileostomy creation for septic shock secondary to C diff colitis   - Fortunately, overall septic picture/shock improving, vasopressor need weaning, labs improved. Appreciate PCCM assistance.   - I do think she will likely need TPN; high risk for ileus   - Continue IV Abx (Zosyn, Flagyl); appreciate ID input   - Continue foley catheter; monitor UO - Monitor abdominal examination - Monitor ileostomy output  - Engage WOC RN for ostomy care/teaching   - Monitor leukocytosis; improved significantly - Monitor renal function; improving - Monitor lactic acidosis; improving   - Okay to engage therapies  All of the above findings and recommendations were discussed with the patient and the medical team.  --  Lynden Oxford, PA-C [facility] 03/01/2024, 7:12 AM M-F: 7am - 4pm

## 2024-03-01 NOTE — TOC Progression Note (Signed)
 Transition of Care [facility]) - Progression Note    Patient Details  Name: Tammy Arias MRN: [id number] Date of Birth: [birth year]  Transition of Care [facility] CM/SW Contact  Margarito Liner, LCSW Phone Number: 03/01/2024, 12:08 PM  Clinical Narrative:  Readmission prevention screen complete. Patient is AOx2 but friend [name] is at bedside. PCP is Jerl Mina, MD. Patient was driving herself to appointments but has supports that can drive her to appointments if needed. She uses [facility]. No issues obtaining medications. Patient lives home alone but [name] and/or her daughter either stay with her or have her stay with them if needed. Patient has a son that lives in [place]. Patient has rails at toilet, a new walk-in shower but no seat, a RW, cane, and CPAP at home. She is not on oxygen at home. No further concerns. CSW will continue to follow patient and [name] for support and facilitate return home once stable. [name] will transport her home at discharge.  Expected Discharge Plan: Home w Home Health Services Barriers to Discharge: Continued Medical Work up  Expected Discharge Plan and Services       Living arrangements for the past 2 months: Apartment                           HH Arranged: PT, OT HH Agency: [facility] Date [facility] Agency Contacted: 02/29/24   Representative spoke with at [facility] Agency: [name]   Social Determinants of Health (SDOH) Interventions SDOH Screenings   Food Insecurity: No Food Insecurity (02/28/2024)  Recent Concern: Food Insecurity - Food Insecurity Present (12/13/2023)   Received from [facility]  Housing: High Risk (02/28/2024)  Transportation Needs: No Transportation Needs (02/28/2024)  Utilities: Not At Risk (02/28/2024)  Financial Resource Strain: Low Risk  (02/11/2024)   [facility] from [facility]  Recent Concern: Financial Resource Strain - Medium Risk (12/13/2023)   [facility] from [facility]  Social Connections: Moderately Integrated (02/28/2024)  Tobacco Use: Medium Risk (02/28/2024)    Readmission Risk Interventions    03/01/2024   12:07 PM 02/29/2024   11:10 AM  Readmission Risk Prevention Plan  Transportation Screening Complete   PCP or Specialist Appt within 3-5 Days Complete Complete  HRI or Home Care Consult Complete Complete  Social Work Consult for Recovery Care Planning/Counseling Complete Complete  Palliative Care Screening Not Applicable Not Applicable  Medication Review [employer]) Complete

## 2024-03-01 NOTE — Plan of Care (Signed)
  Problem: Education: Goal: Knowledge of General Education information will improve Description: Including pain rating scale, medication(s)/side effects and non-pharmacologic comfort measures Outcome: Progressing   Problem: Health Behavior/Discharge Planning: Goal: Ability to manage health-related needs will improve Outcome: Progressing   Problem: Clinical Measurements: Goal: Ability to maintain clinical measurements within normal limits will improve Outcome: Progressing Goal: Will remain free from infection Outcome: Progressing Goal: Diagnostic test results will improve Outcome: Progressing   Problem: Tissue Perfusion: Goal: Adequacy of tissue perfusion will improve Outcome: Progressing

## 2024-03-02 DIAGNOSIS — A0472 Enterocolitis due to Clostridium difficile, not specified as recurrent: Secondary | ICD-10-CM | POA: Diagnosis not present

## 2024-03-02 DIAGNOSIS — A419 Sepsis, unspecified organism: Secondary | ICD-10-CM | POA: Diagnosis not present

## 2024-03-02 DIAGNOSIS — R6521 Severe sepsis with septic shock: Secondary | ICD-10-CM | POA: Diagnosis not present

## 2024-03-02 DIAGNOSIS — K5100 Ulcerative (chronic) pancolitis without complications: Secondary | ICD-10-CM | POA: Diagnosis not present

## 2024-03-02 LAB — RENAL FUNCTION PANEL
Albumin: 2.7 g/dL — ABNORMAL LOW (ref 3.5–5.0)
Anion gap: 10 (ref 5–15)
BUN: 58 mg/dL — ABNORMAL HIGH (ref 8–23)
CO2: 28 mmol/L (ref 22–32)
Calcium: 8.5 mg/dL — ABNORMAL LOW (ref 8.9–10.3)
Chloride: 95 mmol/L — ABNORMAL LOW (ref 98–111)
Creatinine, Ser: 2.42 mg/dL — ABNORMAL HIGH (ref 0.44–1.00)
GFR, Estimated: 21 mL/min — ABNORMAL LOW (ref >60)
Glucose, Bld: 235 mg/dL — ABNORMAL HIGH (ref 70–99)
Phosphorus: 3.3 mg/dL (ref 2.5–4.6)
Potassium: 3.2 mmol/L — ABNORMAL LOW (ref 3.5–5.1)
Sodium: 133 mmol/L — ABNORMAL LOW (ref 135–145)

## 2024-03-02 LAB — COOXEMETRY PANEL
Carboxyhemoglobin: 1.6 % — ABNORMAL HIGH (ref 0.5–1.5)
Methemoglobin: <0.7 % (ref 0.0–1.5)
O2 Saturation: 100 %
Total hemoglobin: 11.8 g/dL — ABNORMAL LOW (ref 12.0–16.0)
Total oxygen content: 98.2 %

## 2024-03-02 LAB — CBC
HCT: 23.5 % — ABNORMAL LOW (ref 36.0–46.0)
Hemoglobin: 8 g/dL — ABNORMAL LOW (ref 12.0–15.0)
MCH: 32.5 pg (ref 26.0–34.0)
MCHC: 34 g/dL (ref 30.0–36.0)
MCV: 95.5 fL (ref 80.0–100.0)
Platelets: 126 10*3/uL — ABNORMAL LOW (ref 150–400)
RBC: 2.46 MIL/uL — ABNORMAL LOW (ref 3.87–5.11)
RDW: 17.3 % — ABNORMAL HIGH (ref 11.5–15.5)
WBC: 12.1 10*3/uL — ABNORMAL HIGH (ref 4.0–10.5)
nRBC: 0.8 % — ABNORMAL HIGH (ref 0.0–0.2)

## 2024-03-02 LAB — HEMOGLOBIN AND HEMATOCRIT, BLOOD
HCT: 23.3 % — ABNORMAL LOW (ref 36.0–46.0)
HCT: 23.4 % — ABNORMAL LOW (ref 36.0–46.0)
Hemoglobin: 7.7 g/dL — ABNORMAL LOW (ref 12.0–15.0)
Hemoglobin: 7.9 g/dL — ABNORMAL LOW (ref 12.0–15.0)

## 2024-03-02 LAB — PROTIME-INR
INR: 1.4 — ABNORMAL HIGH (ref 0.8–1.2)
Prothrombin Time: 17.4 s — ABNORMAL HIGH (ref 11.4–15.2)

## 2024-03-02 LAB — GLUCOSE, CAPILLARY
Glucose-Capillary: 232 mg/dL — ABNORMAL HIGH (ref 70–99)
Glucose-Capillary: 252 mg/dL — ABNORMAL HIGH (ref 70–99)
Glucose-Capillary: 232 mg/dL — ABNORMAL HIGH (ref 70–99)
Glucose-Capillary: 269 mg/dL — ABNORMAL HIGH (ref 70–99)
Glucose-Capillary: 237 mg/dL — ABNORMAL HIGH (ref 70–99)
Glucose-Capillary: 285 mg/dL — ABNORMAL HIGH (ref 70–99)

## 2024-03-02 LAB — APTT: aPTT: 40 s — ABNORMAL HIGH (ref 24–36)

## 2024-03-02 LAB — HEPARIN LEVEL (UNFRACTIONATED)
Heparin Unfractionated: 0.5 [IU]/mL (ref 0.30–0.70)
Heparin Unfractionated: 0.47 [IU]/mL (ref 0.30–0.70)

## 2024-03-02 LAB — MAGNESIUM: Magnesium: 2 mg/dL (ref 1.7–2.4)

## 2024-03-02 LAB — TRIGLYCERIDES: Triglycerides: 136 mg/dL (ref <150)

## 2024-03-02 MED ORDER — POTASSIUM CHLORIDE CRYS ER 20 MEQ PO TBCR
40.0000 meq | EXTENDED_RELEASE_TABLET | Freq: Once | ORAL | Status: AC
  Administered 2024-03-02: 40 meq via ORAL
  Filled 2024-03-02: qty 2

## 2024-03-02 MED ORDER — POTASSIUM CHLORIDE 10 MEQ/100ML IV SOLN
10.0000 meq | INTRAVENOUS | Status: AC
  Administered 2024-03-02 (×4): 10 meq via INTRAVENOUS
  Filled 2024-03-02 (×4): qty 100

## 2024-03-02 MED ORDER — LEVOTHYROXINE SODIUM 50 MCG PO TABS
50.0000 ug | ORAL_TABLET | Freq: Every day | ORAL | Status: DC
  Administered 2024-03-03 – 2024-03-12 (×10): 50 ug via ORAL
  Filled 2024-03-02 (×10): qty 1

## 2024-03-02 MED ORDER — LACTATED RINGERS IV SOLN: INTRAVENOUS | Status: AC

## 2024-03-02 MED ORDER — ENSURE ENLIVE PO LIQD
237.0000 mL | Freq: Three times a day (TID) | ORAL | Status: DC
  Administered 2024-03-02 – 2024-03-04 (×7): 237 mL via ORAL

## 2024-03-02 MED ORDER — INSULIN ASPART 100 UNIT/ML IJ SOLN
0.0000 [IU] | INTRAMUSCULAR | Status: DC
  Administered 2024-03-02: 11 [IU] via SUBCUTANEOUS
  Administered 2024-03-03: 7 [IU] via SUBCUTANEOUS
  Administered 2024-03-03: 3 [IU] via SUBCUTANEOUS
  Administered 2024-03-03: 7 [IU] via SUBCUTANEOUS
  Administered 2024-03-03: 4 [IU] via SUBCUTANEOUS
  Administered 2024-03-03: 11 [IU] via SUBCUTANEOUS
  Administered 2024-03-04: 15 [IU] via SUBCUTANEOUS
  Administered 2024-03-04: 4 [IU] via SUBCUTANEOUS
  Administered 2024-03-04: 11 [IU] via SUBCUTANEOUS
  Administered 2024-03-04: 3 [IU] via SUBCUTANEOUS
  Administered 2024-03-04: 7 [IU] via SUBCUTANEOUS
  Administered 2024-03-05 (×2): 4 [IU] via SUBCUTANEOUS
  Administered 2024-03-05: 7 [IU] via SUBCUTANEOUS
  Administered 2024-03-06: 4 [IU] via SUBCUTANEOUS
  Administered 2024-03-06: 7 [IU] via SUBCUTANEOUS
  Administered 2024-03-06: 4 [IU] via SUBCUTANEOUS
  Administered 2024-03-06: 7 [IU] via SUBCUTANEOUS
  Administered 2024-03-07: 3 [IU] via SUBCUTANEOUS
  Administered 2024-03-07 (×2): 7 [IU] via SUBCUTANEOUS
  Administered 2024-03-08: 4 [IU] via SUBCUTANEOUS
  Administered 2024-03-08: 7 [IU] via SUBCUTANEOUS
  Administered 2024-03-08: 4 [IU] via SUBCUTANEOUS
  Administered 2024-03-08: 7 [IU] via SUBCUTANEOUS
  Administered 2024-03-09: 3 [IU] via SUBCUTANEOUS
  Administered 2024-03-09: 7 [IU] via SUBCUTANEOUS
  Administered 2024-03-09: 3 [IU] via SUBCUTANEOUS
  Administered 2024-03-09: 4 [IU] via SUBCUTANEOUS
  Administered 2024-03-09 – 2024-03-10 (×2): 7 [IU] via SUBCUTANEOUS
  Administered 2024-03-10: 3 [IU] via SUBCUTANEOUS
  Filled 2024-03-02 (×32): qty 1

## 2024-03-02 MED ORDER — HEPARIN (PORCINE) 25000 UT/250ML-% IV SOLN
800.0000 [IU]/h | INTRAVENOUS | Status: DC
  Administered 2024-03-02 – 2024-03-04 (×2): 700 [IU]/h via INTRAVENOUS
  Filled 2024-03-02 (×2): qty 250

## 2024-03-02 MED ORDER — TRACE MINERALS CU-MN-SE-ZN 300-55-60-3000 MCG/ML IV SOLN: INTRAVENOUS | Status: DC

## 2024-03-02 MED ORDER — ACETAMINOPHEN 10 MG/ML IV SOLN
1000.0000 mg | Freq: Four times a day (QID) | INTRAVENOUS | Status: AC
  Administered 2024-03-02 – 2024-03-03 (×4): 1000 mg via INTRAVENOUS
  Filled 2024-03-02 (×4): qty 100

## 2024-03-02 NOTE — Plan of Care (Signed)

## 2024-03-02 NOTE — Progress Notes (Signed)
 [facility] SUR[facility]ICAL PROGRESS NOTE  Hospital Day(s): 4.   Post op day(s): 3 Days Post-Op.   Interval History:  Patient seen and examined She is sitting up in bed and looks comfortable.  She has been weaned off of vasopressin is still on 5 of Levophed.  Reports some soreness in her abdomen but denies any nausea or vomiting.  Ileostomy is started to put out liquid stool with air in it.  Laboratory values improving with continued down trending in her leukocytosis to 12.1 from 13.  Her hemoglobin is 8 from 7.9 early this morning.  Kidney function continues to improve as well.  Vital signs in last 24 hours: [min-max] current  Temp:  [98.1 F (36.7 C)-98.9 F (37.2 C)] 98.1 F (36.7 C) (04/05 0803) Pulse Rate:  [84-111] 109 (04/05 0915) Resp:  [7-22] 18 (04/05 0915) BP: (72-119)/(40-65) 100/65 (04/05 0900) SpO2:  [98 %-100 %] 100 % (04/05 0915) Arterial Line BP: (55-137)/(32-78) 105/53 (04/05 0915)     Height: 5\' 1"  (154.9 cm) Weight: 62.6 kg BMI (Calculated): 26.09   Intake/Output last 2 shifts:  04/04 0701 - 04/05 0700 In: 2157.6 [I.V.:1629.2; IV Piggyback:528.3] Out: 2910 [Urine:2910]   Physical Exam:  Constitutional: Arouses appropriately, NAD Respiratory: On [place], no respiratory distress Cardiovascular: Intermittently tachycardic to the low 100s Gastrointestinal: soft, expected tenderness, non-distended, no rebound/guarding. Ileostomy in RLQ; stool and stoma bag with air Genitourinary: Foley in place; good UO  Integumentary: Laparotomy is CDI with staples, drainage inferiorly   Labs:     Latest Ref Rng & Units 03/02/2024    6:10 AM 03/02/2024   12:24 AM 03/01/2024    7:23 PM  CBC  WBC 4.0 - 10.5 K/uL 12.1     Hemoglobin 12.0 - 15.0 g/dL 8.0  7.9  7.6   Hematocrit 36.0 - 46.0 % 23.5  23.4  22.9   Platelets 150 - 400 K/uL 126         Latest Ref Rng & Units 03/02/2024    6:10 AM 03/01/2024    4:52 AM 02/29/2024    4:52 PM  CMP  Glucose 70 - 99 mg/dL 161  096  045    BUN 8 - 23 mg/dL 58  68  74   Creatinine 0.44 - 1.00 mg/dL 4.09  8.11  9.14   Sodium 135 - 145 mmol/L 133  130  132   Potassium 3.5 - 5.1 mmol/L 3.2  3.6  3.2   Chloride 98 - 111 mmol/L 95  90  89   CO2 22 - 32 mmol/L 28  25  25    Calcium 8.9 - 10.3 mg/dL 8.5  8.4  7.7     Imaging studies: No new pertinent imaging studies   Assessment/Plan:  68 y.o. female 3 Days Post-Op s/p exploratory laparotomy, total colectomy, and end ileostomy creation for septic shock secondary to C diff colitis   - Fortunately, overall septic picture/shock improving, vasopressor need weaning, labs improved. Appreciate PCCM assistance.   - Continue TPN for now okay to advance to a clear liquid diet today  - Continue IV Abx (Zosyn, Flagyl); appreciate ID input   - Continue foley catheter; monitor UO - Monitor abdominal examination - Monitor ileostomy output  - Engage WOC RN for ostomy care/teaching   - Monitor leukocytosis; improved significantly - Monitor renal function; improving - Monitor lactic acidosis; improving              - Needs to get up and mobilize today.  Recommend PT OT consult

## 2024-03-02 NOTE — Progress Notes (Signed)
 [facility]  PROGRESS NOTE   Subjective:   Patient seen at bedside.  Comfortable.  Urine output is much better  Objective:  Vital signs: Blood pressure (!) 101/52, pulse 93, temperature 98.1 F (36.7 C), temperature source Oral, resp. rate 10, height 5\' 1"  (1.549 m), weight 62.6 kg, SpO2 100%.  Intake/Output Summary (Last 24 hours) at 03/02/2024 1218 Last data filed at 03/02/2024 1200 Gross per 24 hour  Intake 2323.54 ml  Output 2900 ml  Net -576.46 ml   Filed Weights   02/28/24 0145 02/28/24 0515 02/29/24 0308  Weight: 61.1 kg 61.3 kg 62.6 kg     Physical Exam: General:  No acute distress  Head:  Normocephalic, atraumatic. Moist oral mucosal membranes  Eyes:  Anicteric  Neck:  Supple  Lungs:   Clear to auscultation, normal effort  Heart:  S1S2 no rubs  Abdomen:   Soft, nontender, bowel sounds present  Extremities:  peripheral edema.  Neurologic:  Awake, alert, following commands  Skin:  No lesions  Access:     Basic Metabolic Panel: Recent Labs  Lab 02/28/24 0156 02/28/24 0608 02/28/24 1026 02/28/24 2132 02/29/24 0431 02/29/24 1652 03/01/24 0452 03/02/24 0610  NA 123* 124*   < > 129* 131* 132* 130* 133*  K 3.5 3.7   < > 3.3* 3.7 3.2* 3.6 3.2*  CL 90* 89*   < > 89* 88* 89* 90* 95*  CO2 7* 9*   < > 15* 16* 25 25 28   GLUCOSE 224* 228*   < > 204* 200* 147* 148* 235*  BUN 86* 84*   < > 80* 76* 74* 68* 58*  CREATININE 6.15* 6.17*   < > 5.38* 4.90* 4.17* 3.82* 2.42*  CALCIUM 6.3* 6.8*   < > 7.3* 7.6* 7.7* 8.4* 8.5*  MG 1.5* 1.6*  --   --  1.5*  --  2.0 2.0  PHOS 8.2* 7.9*  --   --  6.3*  --  4.4 3.3   < > = values in this interval not displayed.   GFR: Estimated Creatinine Clearance: 18.6 mL/min (A) (by C-G formula based on SCr of 2.42 mg/dL (H)).  Liver Function Tests: Recent Labs  Lab 02/27/24 1901 02/28/24 0608 02/28/24 1528 02/29/24 0431 03/02/24 0610  AST 40 33 32  --   --   ALT 19 20 19   --   --   ALKPHOS 139* 109 106  --   --    BILITOT 0.9 1.2 0.9  --   --   PROT 6.6 5.8* 5.8*  --   --   ALBUMIN 3.1* 2.8* 2.9* 3.5 2.7*   Recent Labs  Lab 02/27/24 1901  LIPASE 25   No results for input(s): "AMMONIA" in the last 168 hours.  CBC: Recent Labs  Lab 02/27/24 1901 02/28/24 0156 02/28/24 0608 02/28/24 1451 02/28/24 1528 02/28/24 1749 02/29/24 0431 02/29/24 1137 03/01/24 0452 03/01/24 0701 03/01/24 1310 03/01/24 1923 03/02/24 0024 03/02/24 0610  WBC 36.5*   < > 43.1*  --  32.9*  --  19.3*  --  13.5*  --   --   --   --  12.1*  NEUTROABS 33.3*  --   --   --  29.6*  --   --   --   --   --   --   --   --   --   HGB 12.6   < > 11.0*   < > 10.2*   < > 9.3*   < >  8.3* 8.4* 8.0* 7.6* 7.9* 8.0*  HCT 39.2   < > 32.9*   < > 30.6*   < > 27.5*   < > 23.9* 24.2* 22.7* 22.9* 23.4* 23.5*  MCV 96.3   < > 94.8  --  95.0  --  94.2  --  90.9  --   --   --   --  95.5  PLT 279   < > 285  --  268  --  209  --  115*  --   --   --   --  126*   < > = values in this interval not displayed.     HbA1C: Hgb A1c MFr Bld  Date/Time Value Ref Range Status  02/28/2024 06:08 AM 5.9 (H) 4.8 - 5.6 % Final    Comment:    (NOTE) Pre diabetes:          5.7%-6.4%  Diabetes:              >6.4%  Glycemic control for   <7.0% adults with diabetes   06/07/2023 02:58 AM 6.3 (H) 4.8 - 5.6 % Final    Comment:    (NOTE)         Prediabetes: 5.7 - 6.4         Diabetes: >6.4         Glycemic control for adults with diabetes: <7.0     Urinalysis: Recent Labs    02/29/24 0431  COLORURINE YELLOW*  LABSPEC 1.009  PHURINE 6.0  GLUCOSEU 150*  HGBUR LARGE*  BILIRUBINUR NEGATIVE  KETONESUR NEGATIVE  PROTEINUR 30*  NITRITE NEGATIVE  LEUKOCYTESUR NEGATIVE      Imaging: No results found.   Medications:    acetaminophen 1,000 mg (03/02/24 1112)   lactated ringers 75 mL/hr at 03/02/24 1107   norepinephrine (LEVOPHED) Adult infusion 3 mcg/min (03/02/24 1138)   piperacillin-tazobactam (ZOSYN)  IV 2.25 g (03/02/24 0640)    potassium chloride 10 mEq (03/02/24 1131)   TPN ADULT (ION) 30 mL/hr at 03/01/24 1846    Chlorhexidine Gluconate Cloth  6 each Topical Daily   feeding supplement  237 mL Oral TID BM   insulin aspart  0-9 Units Subcutaneous Q4H   latanoprost  1 drop Both Eyes QHS   [START ON 03/03/2024] levothyroxine  50 mcg Oral Q0600   lidocaine  1 patch Transdermal Q0600   potassium chloride  40 mEq Oral Once    Assessment/ Plan:     68 y.o. female with  medical problems of coronary disease status post CABG, ischemic cardiomyopathy, history of atrial fibrillation status post Watchman procedure, pulmonary hypertension  admitted on 02/27/2024 for Pancolitis (HCC) [K51.00] Severe sepsis with septic shock (CODE) (HCC) [R65.21] Sepsis with acute renal failure and septic shock, due to unspecified organism, unspecified acute renal failure type (HCC) [A41.9, R65.21, N17.9]     1.  Acute kidney injury on chronic kidney disease stage IIIa (06/06/2023) AKI likely secondary to severe ATN from concurrent illness.  Presently in diuretic phase of ATN. Admit creatinine critically elevated at 7.04 which has steadily improved and is down to 2.4 today.  Patient is nonoliguric.  Electrolytes and volume status are acceptable.   2.  Severe C. difficile colitis, status post total colectomy and end ileostomy, septic shock -Currently in recovery phase.  Management as per surgery team.   3.  Chronic systolic CHF, ischemic cardiomyopathy EF 35%, atrial fibrillation, pulmonary hypertension -Management as per cardiology team. Currently requiring pressors- norepinephrine.  4.  Hyponatremia Likely due to AKI and acute illness. Expected to improve as renal function steadily improves.   5.  Hypokalemia Potassium replacement as per ICU protocols.  Labs and medications reviewed. Will continue to follow along with you.   LOS: 4 Lorain Childes, MD [facility] 4/5/202512:18 PM

## 2024-03-02 NOTE — Progress Notes (Signed)
 [facility]  SUBJECTIVE: Patient laying in bed, denies chest pain or shortness of breath   Vitals:   03/02/24 0700 03/02/24 0715 03/02/24 0730 03/02/24 0745  BP:      Pulse: 94 99 96 92  Resp: (!) 9 (!) 7 10 16   Temp:      TempSrc:      SpO2: 100% 100% 100% 100%  Weight:      Height:         Intake/Output Summary (Last 24 hours) at 03/02/2024 0836 Last data filed at 03/02/2024 1610 Gross per 24 hour  Intake 2148.96 ml  Output 2735 ml  Net -586.04 ml      PHYSICAL EXAM  General: Well developed, well nourished, in no acute distress HEENT:  Normocephalic and atramatic Neck:  No JVD.  Lungs: Clear bilaterally to auscultation and percussion. Heart: HRRR . Normal S1 and S2 without gallops or murmurs.  Abdomen: Bowel sounds are positive, abdomen soft and non-tender  Msk:  Back normal, normal gait. Normal strength and tone for age. Extremities: No clubbing, cyanosis or edema.   Neuro: Alert and oriented X 3. Psych:  Good affect, responds appropriately   LABS: Basic Metabolic Panel: Recent Labs    03/01/24 0452 03/02/24 0610  NA 130* 133*  K 3.6 3.2*  CL 90* 95*  CO2 25 28  GLUCOSE 148* 235*  BUN 68* 58*  CREATININE 3.82* 2.42*  CALCIUM 8.4* 8.5*  MG 2.0 2.0  PHOS 4.4 3.3   Liver Function Tests: Recent Labs    02/28/24 1528 02/29/24 0431 03/02/24 0610  AST 32  --   --   ALT 19  --   --   ALKPHOS 106  --   --   BILITOT 0.9  --   --   PROT 5.8*  --   --   ALBUMIN 2.9* 3.5 2.7*   No results for input(s): "LIPASE", "AMYLASE" in the last 72 hours. CBC: Recent Labs    02/28/24 1528 02/28/24 1749 03/01/24 0452 03/01/24 0701 03/02/24 0024 03/02/24 0610  WBC 32.9*   < > 13.5*  --   --  12.1*  NEUTROABS 29.6*  --   --   --   --   --   HGB 10.2*   < > 8.3*   < > 7.9* 8.0*  HCT 30.6*   < > 23.9*   < > 23.4* 23.5*  MCV 95.0   < > 90.9  --   --  95.5  PLT 268   < > 115*  --   --  126*   < > = values in this interval not displayed.   Cardiac  Enzymes: No results for input(s): "CKTOTAL", "CKMB", "CKMBINDEX", "TROPONINI" in the last 72 hours. BNP: Invalid input(s): "POCBNP" D-Dimer: No results for input(s): "DDIMER" in the last 72 hours. Hemoglobin A1C: No results for input(s): "HGBA1C" in the last 72 hours. Fasting Lipid Panel: Recent Labs    03/02/24 0611  TRIG 136   Thyroid Function Tests: No results for input(s): "TSH", "T4TOTAL", "T3FREE", "THYROIDAB" in the last 72 hours.  Invalid input(s): "FREET3" Anemia Panel: No results for input(s): "VITAMINB12", "FOLATE", "FERRITIN", "TIBC", "IRON", "RETICCTPCT" in the last 72 hours.  No results found.   Echo EF 35% 01/28/2024  TELEMETRY: Atrial fibrillation:  ASSESSMENT AND PLAN:  Principal Problem:   Severe sepsis with septic shock (CODE) (HCC) Active Problems:   Pancolitis (HCC)   Clostridium difficile colitis   Sepsis with acute renal failure and  septic shock (HCC)    1.  Ischemic cardiomyopathy, LVEF 35%, good medical management being held at this time, status post colectomy 2.  Coronary artery disease, status post CABG, no chest pain, minimal troponin elevation (78, 89) 3.  Persistent atrial fibrillation, status post Watchman device 12/04/2023, Eliquis held due to anemia and downtrend in hemoglobin (8.0) 4.  Elevated troponin (78, 89), likely demand supply ischemia 5.  Severe C. difficile colitis, status post total colectomy and end ileostomy 02/28/2024 6.  Acute kidney injury on CKD stage III, likely secondary to severe ATN due to septic shock, slowly improving BUN and creatinine 68 and 3.82, respectively  Recommendations  1.  Agree with current therapy 2.  Resume good medical management (valsartan, spironolactone, torsemide, empagliflozin) as p.o. intake improves and renal function stabilizes 3.  Hold Eliquis for now, monitor hemoglobin 4.  IV diuretics as needed 5.  No further cardiac diagnostics at this time    Marcina Millard, MD, PhD,  [facility] 03/02/2024 8:36 AM

## 2024-03-02 NOTE — Progress Notes (Signed)
 NAME:  Tammy Arias, MRN:  [id number], DOB:  December 08, 1955, LOS: 4 ADMISSION DATE:  02/27/2024, CHIEF COMPLAINT:  Septic Shock   History of Present Illness:   68 yo F presenting to [facility] ED from home for evaluation of diarrhea and vomiting since 02/25/24.   History obtained per chart review and patient bedside report. Patient has had multiple hospitalizations since January 2025 with GI bleeding s/p clipping, watchman device placement and most recently acute CHF exacerbation from 01/26/24- 02/13/24.  During her most recent admission, she required loop diuretics as well as inotrope assistance for adequate diuresis. She was changed from entresto to valsartan due to hypotension and her BB was stopped. She was continued on Eliquis post watchman device per EP recommendations after 6 week TEE on 01/23/24 noted a small flow jet from the LAA back into the LA proper at the edge of the inferior aspect of the occluder device.  She also tested positive for C.Diff completing a 10 day course of PO vancomycin for treatment on 02/10/24.   After discharge she was in her normal state of health until noticing recurrent diarrhea on 02/17/24, which she thought might just be an IBS flare. Diarrhea however worsened on 02/21/24 which got much worse on 3/28 with the addition of persistent non-bloody bilious emesis. Her best friend, who has been helping care for her stated she noticed some blood in her stool that seemed to be from the patient's hemorrhoids. However this bleeding significantly increased on 02/27/24 and the patient has had at least 2 bloody bowel movements. The patient also endorses generalized weakness and fatigue, diffuse abdominal tenderness, blurred vision with green spots. She denies chest pain, dyspnea, urinary symptoms, fever/chills, LOC or palpitations. She did fall about 3 days ago, landing on her buttocks, she denies hitting her head.   She reports continuing to take all her prescribed medications including her  valsartan, torsemide and Eliquis.   ED course: Upon arrival patient significantly hypotensive and drowsy. Sepsis protocol initiated and due to sever hypotension, central venous catheter placed and vasopressor started. Labs significant for hyponatremia, hypochloremia, AGMA, AKI on CKD, elevated Alk Phos, leukocytosis and significant lactic acidosis with fecal occult positive. Small amount of hematochezia noted in ED. Imaging significant for pan colitis without signs of toxic megacolon and cholelithiasis without cholecystis. Medications given: cefepime/vancomycin/flagyl, levophed drip, 2.75 L IVF bolus Initial Vitals: 97.4, 15, 90, 62/35 & 100% on RA  C.Diff testing: Positive   CXR 02/27/24: no acute cardiopulmonary disease CT abdomen/pelvis wo contrast 02/27/24: Diffuse colonic wall thickening and surrounding inflammation compatible with pancolitis. Cholelithiasis.   PCCM consulted for admission due to severe sepsis with shock secondary to recurrent C.Diff colitis requiring vasopressor support.  Pertinent  Medical History   CAD s/p CABG & ASD repair (2005) HFrEF (LVEF 30%) ICM Pulmonary HTN Heart Murmur Atrial Fibrillation s/p Watchman device (11/2023) Hypothyroidism HLD CKD stage 3b (baseline Cr 1.0) HTN Iron deficiency Anemia Angiodysplasia of the intestinal tract Diverticulosis GI bleeding due to AVM s/p clipping (11/2023) OSA on CPAP T2DM TIA Gout Former Smoker (20+ pack history) Severe MR/TR Pancytopenia  Significant Hospital Events: Including procedures, antibiotic start and stop dates in addition to other pertinent events   02/27/24: Admit to ICU with severe sepsis and shock secondary to recurrent C.Diff colitis requiring vasopressor support. 02/28/24: on two pressors, rising lactic acidosis, surgery consulted, underwent colectomy 02/29/24: awake and follows commands off sedation, vasopressor requirements improved, extubated 03/01/24: remains weak but continues to improve, on low  dose  nor-epinephrine 03/02/24: pressor requirements improving, denies pain, tolerating sips   Interim History / Subjective:  Awake, alert and oriented. No abdominal pain reported  Objective   Blood pressure (!) 99/54, pulse 94, temperature 98.1 F (36.7 C), temperature source Oral, resp. rate 12, height 5\' 1"  (1.549 m), weight 62.6 kg, SpO2 100%.        Intake/Output Summary (Last 24 hours) at 03/02/2024 1703 Last data filed at 03/02/2024 1630 Gross per 24 hour  Intake 3441.99 ml  Output 3100 ml  Net 341.99 ml   Filed Weights   02/28/24 0145 02/28/24 0515 02/29/24 0308  Weight: 61.1 kg 61.3 kg 62.6 kg    Examination: Physical Exam Constitutional:      General: She is not in acute distress.    Appearance: She is ill-appearing.  Cardiovascular:     Rate and Rhythm: Regular rhythm. Tachycardia present.     Pulses: Normal pulses.     Heart sounds: Normal heart sounds.  Pulmonary:     Effort: Pulmonary effort is normal.     Breath sounds: Normal breath sounds.  Abdominal:     Palpations: Abdomen is soft.     Comments: End ileostomy in place  Neurological:     Mental Status: She is alert. She is disoriented.     Motor: Weakness present.       Assessment & Plan:   Neurology #Abdominal Pain #Post operative ventilator requirement  Pain control with Tylenol and lidocaine patch.  Extubated and off analgosedation.  -IV tylenol every 6 hours -PRN narcotics for severe pain  Cardiovascular #Septic shock #Heart failure with reduced ejection fraction #Atrial fibrillation  #s/p Watchman procedure (11/2023) #CAD status post CABG  Recently admitted to [facility] 2/28 - 3/18 with decompensated heart failure and significant volume overload.  Right heart cath showed RA 26, PA 60/22 (40), PCWP 30, CI 2.0, PVR 2.9 and aggressively diuresed.  Repeat right heart cath on 3/12 showed elevated pressures but the results are not available. Her echocardiography is showing severe mitral  regurgitation and tricuspid regurgitation with plan for outpatient MitraClip and tri-clip placement.  She was discharged on goal-directed medical therapy presents now with hypotension.  Patient with overall presentation that is consistent with septic shock secondary to C. difficile colitis. Given rising lactic acid and pressor requirements she underwent emergent colectomy, now with improvement in perfusion. Appreciate input from cardiology today. Co-ox was re-assuring, unlikely in cardiogenic shock. Vasopressor requirements have been downtrending with clearance of her lactic acid.   -goal MAP > 65; wean nor-epi as tolerated -lactic acid normalized -cardiology consult appreciated -hold GDMT  Pulmonary #Post operative vent requirements  Extubated to nasal cannula, tolerating well without issues. ABG re-assuring.  Gastrointestinal #Severe C. difficile colitis #Hematochezia #Diverticulosis #Colonic AVM  Presents with hematochezia and recurrent diarrhea since Friday.  Patient recently admitted to [facility] where she developed C. difficile colitis treated with oral vancomycin. She's also been admitted to [facility] in January with GI bleed where she was found to have AVM's as well as diverticulosis.  Comes in now with worsening diarrhea and shock with imaging notable for diffuse pancolitis, abdominal pain, and concern for peritonitis. This is consistent with severe C. Diff colitis with hemodynamic instability and septic shock and has undergone colectomy and end ileostomy. She is improving well post procedure.  -surgery consult appreciated -advance diet to clears -continue PPI -TPN per surgery  Renal #AKI #Metabolic acidosis  Presents with severe kidney injury in the setting of shock as well as  continued use of her outpatient GDMT with direct nephrotoxicity as well as pre-renal azotemia and likely ATN. Kidney function continues to improve, with improvement in creatinine and urine output.  Suspect post ATN diuresis, replacing lost fluids with IV LR.  -avoid nephrotoxins -goal MAP > 65 mmHg -hold GDMT -follow nephrology consult  Endocrine  ICU glycemic protocol.  Hem/Onc #Afib on Eliquis #Acute Blood loss anemia  Presents with hematochezia due to colitis. Was on Apixaban for afib as well as for continued anti-coagulation post watchman placement. FFP's administered prior to surgery.  -resume heparin today for afib -monitor CBC  ID # Septic shock #Severe C. difficile colitis  Presents with severe C. difficile colitis with hematochezia and bloody diarrhea.  Previously treated with oral vancomycin during her recent hospitalization at [facility].  Given hemodynamic instability, started on oral vancomycin (500 mg every 6 hours) as well as IV metronidazole. Oral vancomycin held given colectomy for source control and NPO status, continued on IV metronidazole. Continued on broad spectrum antibiotics.   Best Practice (right click and "Reselect all SmartList Selections" daily)   Diet/type: clear liquids DVT prophylaxis other; active bleed, surgery, held Pressure ulcer(s): N/A GI prophylaxis: PPI Lines: Central line and Arterial Line Foley:  Yes, and it is still needed Code Status:  full code Last date of multidisciplinary goals of care discussion [03/02/2024]  Labs   CBC: Recent Labs  Lab 02/27/24 1901 02/28/24 0156 02/28/24 0608 02/28/24 1451 02/28/24 1528 02/28/24 1749 02/29/24 0431 02/29/24 1137 03/01/24 0452 03/01/24 0701 03/01/24 1310 03/01/24 1923 03/02/24 0024 03/02/24 0610  WBC 36.5*   < > 43.1*  --  32.9*  --  19.3*  --  13.5*  --   --   --   --  12.1*  NEUTROABS 33.3*  --   --   --  29.6*  --   --   --   --   --   --   --   --   --   HGB 12.6   < > 11.0*   < > 10.2*   < > 9.3*   < > 8.3* 8.4* 8.0* 7.6* 7.9* 8.0*  HCT 39.2   < > 32.9*   < > 30.6*   < > 27.5*   < > 23.9* 24.2* 22.7* 22.9* 23.4* 23.5*  MCV 96.3   < > 94.8  --  95.0  --  94.2  --  90.9   --   --   --   --  95.5  PLT 279   < > 285  --  268  --  209  --  115*  --   --   --   --  126*   < > = values in this interval not displayed.    Basic Metabolic Panel: Recent Labs  Lab 02/28/24 0156 02/28/24 0608 02/28/24 1026 02/28/24 2132 02/29/24 0431 02/29/24 1652 03/01/24 0452 03/02/24 0610  NA 123* 124*   < > 129* 131* 132* 130* 133*  K 3.5 3.7   < > 3.3* 3.7 3.2* 3.6 3.2*  CL 90* 89*   < > 89* 88* 89* 90* 95*  CO2 7* 9*   < > 15* 16* 25 25 28   GLUCOSE 224* 228*   < > 204* 200* 147* 148* 235*  BUN 86* 84*   < > 80* 76* 74* 68* 58*  CREATININE 6.15* 6.17*   < > 5.38* 4.90* 4.17* 3.82* 2.42*  CALCIUM 6.3* 6.8*   < >  7.3* 7.6* 7.7* 8.4* 8.5*  MG 1.5* 1.6*  --   --  1.5*  --  2.0 2.0  PHOS 8.2* 7.9*  --   --  6.3*  --  4.4 3.3   < > = values in this interval not displayed.   GFR: Estimated Creatinine Clearance: 18.6 mL/min (A) (by C-G formula based on SCr of 2.42 mg/dL (H)). Recent Labs  Lab 02/28/24 0156 02/28/24 0608 02/28/24 0920 02/28/24 1528 02/28/24 1748 02/29/24 0431 02/29/24 1044 02/29/24 1309 03/01/24 0452 03/01/24 1055 03/01/24 1310 03/02/24 0610  PROCALCITON 41.08 38.89  --   --   --  17.07  --   --   --   --   --   --   WBC 45.4* 43.1*  --  32.9*  --  19.3*  --   --  13.5*  --   --  12.1*  LATICACIDVEN 4.6* 6.4*   < > 5.6*   < > 2.9* 2.6* 2.7*  --  1.5 1.5  --    < > = values in this interval not displayed.    Liver Function Tests: Recent Labs  Lab 02/27/24 1901 02/28/24 0608 02/28/24 1528 02/29/24 0431 03/02/24 0610  AST 40 33 32  --   --   ALT 19 20 19   --   --   ALKPHOS 139* 109 106  --   --   BILITOT 0.9 1.2 0.9  --   --   PROT 6.6 5.8* 5.8*  --   --   ALBUMIN 3.1* 2.8* 2.9* 3.5 2.7*   Recent Labs  Lab 02/27/24 1901  LIPASE 25   No results for input(s): "AMMONIA" in the last 168 hours.  ABG    Component Value Date/Time   PHART 7.47 (H) 03/01/2024 1055   PCO2ART 40 03/01/2024 1055   PO2ART 112 (H) 03/01/2024 1055   HCO3  29.1 (H) 03/01/2024 1055   ACIDBASEDEF 2.7 (H) 02/29/2024 0952   O2SAT 100 03/02/2024 0813     Coagulation Profile: Recent Labs  Lab 02/28/24 0156 02/28/24 1451 02/29/24 0431 03/02/24 1141  INR 5.0* 3.0* 2.1* 1.4*    Cardiac Enzymes: Recent Labs  Lab 02/28/24 0156  CKTOTAL 53    HbA1C: Hgb A1c MFr Bld  Date/Time Value Ref Range Status  02/28/2024 06:08 AM 5.9 (H) 4.8 - 5.6 % Final    Comment:    (NOTE) Pre diabetes:          5.7%-6.4%  Diabetes:              >6.4%  Glycemic control for   <7.0% adults with diabetes   06/07/2023 02:58 AM 6.3 (H) 4.8 - 5.6 % Final    Comment:    (NOTE)         Prediabetes: 5.7 - 6.4         Diabetes: >6.4         Glycemic control for adults with diabetes: <7.0     CBG: Recent Labs  Lab 03/01/24 2334 03/02/24 0307 03/02/24 0738 03/02/24 1121 03/02/24 1530  GLUCAP 193* 232* 252* 232* 269*    Review of Systems:   N/A  Past Medical History:  She,  has a past medical history of AC (acromioclavicular) joint bone spurs, right, Anemia, Arthritis, Atrial fibrillation (HCC), Cardiac murmur, CHF (congestive heart failure) (HCC), Chronic anticoagulation, Cor triatriatum, Coronary artery disease, Cortical cataract, DOE (dyspnea on exertion), DOE (dyspnea on exertion), GERD (gastroesophageal reflux disease), HFrEF (heart failure with reduced  ejection fraction) (HCC), History of shingles (2004), Hyperlipidemia, Hypertension, Hypothyroidism, IBS (irritable bowel syndrome), Ischemic cardiomyopathy, Lumbar scoliosis, Lumbar spinal stenosis, Migraines, Osteoporosis, Pulmonary HTN (HCC), S/P CABG x 3 (01/28/2004), Sleep apnea, T2DM (type 2 diabetes mellitus) (HCC), TIA (transient ischemic attack) (05/29/2016), and Vitamin B 12 deficiency.   Surgical History:   Past Surgical History:  Procedure Laterality Date   CARDIAC CATHETERIZATION     CATARACT EXTRACTION W/ INTRAOCULAR LENS IMPLANT Bilateral    Cataract Extraction with IOL   COLECTOMY   02/28/2024   Procedure: COLECTOMY, TOTAL;  Surgeon: Campbell Lerner, MD;  Location: [facility] ORS;  Service: General;;   COLONOSCOPY N/A 12/19/2018   Procedure: COLONOSCOPY;  Surgeon: Pasty Spillers, MD;  Location: [facility] ENDOSCOPY;  Service: Endoscopy;  Laterality: N/A;   COLONOSCOPY WITH PROPOFOL N/A 06/17/2020   Procedure: COLONOSCOPY WITH PROPOFOL;  Surgeon: Pasty Spillers, MD;  Location: [facility] ENDOSCOPY;  Service: Endoscopy;  Laterality: N/A;   COLONOSCOPY WITH PROPOFOL N/A 03/30/2023   Procedure: COLONOSCOPY WITH PROPOFOL;  Surgeon: Jaynie Collins, DO;  Location: [facility] ENDOSCOPY;  Service: Endoscopy;  Laterality: N/A;   COR TRIATRIATUM REPAIR N/A 01/28/2004   CORONARY ARTERY BYPASS GRAFT N/A 01/28/2004   3v CABG   ESOPHAGOGASTRODUODENOSCOPY N/A 12/19/2018   Procedure: ESOPHAGOGASTRODUODENOSCOPY (EGD);  Surgeon: Pasty Spillers, MD;  Location: [facility] ENDOSCOPY;  Service: Endoscopy;  Laterality: N/A;   ESOPHAGOGASTRODUODENOSCOPY (EGD) WITH PROPOFOL N/A 06/17/2020   Procedure: ESOPHAGOGASTRODUODENOSCOPY (EGD) WITH PROPOFOL;  Surgeon: Pasty Spillers, MD;  Location: [facility] ENDOSCOPY;  Service: Endoscopy;  Laterality: N/A;   ESOPHAGOGASTRODUODENOSCOPY (EGD) WITH PROPOFOL N/A 03/30/2023   Procedure: ESOPHAGOGASTRODUODENOSCOPY (EGD) WITH PROPOFOL;  Surgeon: Jaynie Collins, DO;  Location: [facility] ENDOSCOPY;  Service: Endoscopy;  Laterality: N/A;   LAPAROTOMY N/A 02/28/2024   Procedure: LAPAROTOMY, EXPLORATORY;  Surgeon: Campbell Lerner, MD;  Location: [facility] ORS;  Service: General;  Laterality: N/A;   RIGHT/LEFT HEART CATH AND CORONARY ANGIOGRAPHY Bilateral 03/29/2017   Procedure: Right/Left Heart Cath and Coronary Angiography;  Surgeon: Dalia Heading, MD;  Location: [facility] INVASIVE CV LAB;  Service: Cardiovascular;  Laterality: Bilateral;   TOTAL KNEE ARTHROPLASTY Right 04/05/2022   Procedure: TOTAL KNEE ARTHROPLASTY;  Surgeon: Kennedy Bucker, MD;  Location: [facility] ORS;  Service:  Orthopedics;  Laterality: Right;   TUBAL LIGATION     VENTRAL HERNIA REPAIR N/A 04/21/2017   Procedure: HERNIA REPAIR VENTRAL ADULT;  Surgeon: Nadeen Landau, MD;  Location: [facility] ORS;  Service: General;  Laterality: N/A;     Social History:   reports that she quit smoking about 24 years ago. Her smoking use included cigarettes. She has never used smokeless tobacco. She reports that she does not drink alcohol and does not use drugs.   Family History:  Her family history includes Breast cancer in her cousin; Cancer in her sister and sister; Diabetes in her father; Valvular heart disease in her mother.   Allergies Allergies  Allergen Reactions   Vioxx [Rofecoxib] Swelling     Home Medications  Prior to Admission medications   Medication Sig Start Date End Date Taking? Authorizing Provider  albuterol (PROVENTIL HFA;VENTOLIN HFA) 108 (90 Base) MCG/ACT inhaler Inhale 2 puffs into the lungs every 6 (six) hours as needed for wheezing or shortness of breath.   Yes [provider]  allopurinol (ZYLOPRIM) 300 MG tablet Take 300 mg by mouth daily.   Yes [provider]  colchicine 0.6 MG tablet Take 0.6-1.8 mg by mouth as needed (take 1.2 mg (2 tablets) at onset  of gout flare, Take one additinaltablet one hour later if symptoms persist. Maximum of 3 tablets per day).   Yes [provider]  ELIQUIS 5 MG TABS tablet Take 5 mg by mouth 2 (two) times daily.   Yes [provider]  JARDIANCE 10 MG TABS tablet Take 10 mg by mouth daily.   Yes [provider]  latanoprost (XALATAN) 0.005 % ophthalmic solution Place 1 drop into both eyes at bedtime.   Yes [provider]  levothyroxine (SYNTHROID) 50 MCG tablet Take 50 mcg by mouth daily before breakfast.   Yes [provider]  metFORMIN (GLUCOPHAGE) 1000 MG tablet Take 1,000 mg by mouth 2 (two) times daily with a meal.   Yes [provider]  pantoprazole (PROTONIX) 40 MG tablet  Take 40 mg by mouth daily. 01/28/21  Yes [provider]  rosuvastatin (CRESTOR) 5 MG tablet Take 5 mg by mouth daily. 02/20/24 02/19/25 Yes [provider]  spironolactone (ALDACTONE) 25 MG tablet Take 25 mg by mouth daily.   Yes [provider]  sucralfate (CARAFATE) 1 g tablet Take 1 g by mouth 4 (four) times daily -  with meals and at bedtime.   Yes [provider]  torsemide (DEMADEX) 20 MG tablet Take 20 mg by mouth 2 (two) times daily. 02/20/24 02/19/25 Yes [provider]  valsartan (DIOVAN) 40 MG tablet Take 20 mg by mouth 2 (two) times daily. 02/20/24 02/19/25 Yes [provider]  vitamin B-12 (CYANOCOBALAMIN) 1000 MCG tablet Take 1,000 mcg by mouth daily.   Yes [provider]     Critical care time: 40 minutes    Raechel Chute, MD [facility] 03/02/2024 5:06 PM

## 2024-03-02 NOTE — Progress Notes (Addendum)
 PHARMACY - TOTAL PARENTERAL NUTRITION CONSULT NOTE   Indication: Prolonged ileus  Patient Measurements: Height: 5\' 1"  (154.9 cm) Weight: 62.6 kg (138 lb 0.1 oz) IBW/kg (Calculated) : 47.8 TPN AdjBW (KG): 51.1 Body mass index is 26.08 kg/m.  Assessment: 68 y/o female with h/o ischemic cardiomyopathy, gastric AVM, GI angiodysplasia, IBS-D, IDA, s/p watchman device, cholelithiasis, TIA, hypothyroidism, pulmonary hypertension, GERD, barrett's esophagus, HLD, HTN, OSA, CAD s/p CABG x 3, PAF, CHF, cirrhosis, portal hypertension, gout, CKD III, B12 deficiency, ventral hernia s/p mesh repair 2018, colonic angioectasias, diverticulosis, DDD, hiatal hernia, esophageal varices and recent admission to [facility] with C-diff and CHF who is admitted with sepsis, pancolitis, AKI and GIB.   Glucose / Insulin:  --previous 24h: BG 252 required 8 units SSI (A1c 5.9) Electrolytes:  --hyponatremia: improving Renal: SCr: 7.04 > 3.82 > 2.42 Hepatic: LFTs, bilirubin wnl at baseline --TG notably elevated but was on propofol at that time, now off Intake / Output:  positive 6.2 L MIVF: lactated ringers at 75 mL/hr x 1 liter GI Imaging:No new pertinent imaging studies  GI Surgeries / Procedures:  04/02 exploratory laparotomy, total colectomy, and end ileostomy creation   Central access: 03/01/24 (pending) TPN start date: 03/01/24   Nutritional Goals: Goal TPN rate is 65 mL/hr (provides 90 g of protein and 1801 kcals per day)  RD Assessment: Estimated Needs Total Energy Estimated Needs: 1600-1800kcal/day Total Protein Estimated Needs: 80-90g/day Total Fluid Estimated Needs: 1.3-1.5L/day  Current Nutrition:  NPO  Plan:  Plan to stop TPN.   Ronnald Ramp, PharmD, BCPS 03/02/2024,10:06 AM

## 2024-03-02 NOTE — Consult Note (Signed)
 PHARMACY - ANTICOAGULATION CONSULT NOTE  Pharmacy Consult for heparin Indication: atrial fibrillation  Allergies  Allergen Reactions   Vioxx [Rofecoxib] Swelling    Patient Measurements: Height: 5\' 1"  (154.9 cm) Weight: 62.6 kg (138 lb 0.1 oz) IBW/kg (Calculated) : 47.8 HEPARIN DW (KG): 60.2  Vital Signs: Temp: 98.2 F (36.8 C) (04/05 2000) Temp Source: Oral (04/05 2000) BP: 95/59 (04/05 2000) Pulse Rate: 108 (04/05 2215)  Labs: Recent Labs    02/29/24 0431 02/29/24 1137 02/29/24 1652 02/29/24 1748 03/01/24 0452 03/01/24 0701 03/02/24 0024 03/02/24 0610 03/02/24 1141 03/02/24 1727 03/02/24 2212  HGB 9.3*   < >  --    < > 8.3*   < > 7.9* 8.0*  --  7.7*  --   HCT 27.5*   < >  --    < > 23.9*   < > 23.4* 23.5*  --  23.3*  --   PLT 209  --   --   --  115*  --   --  126*  --   --   --   APTT  --   --   --   --   --   --   --   --  40*  --   --   LABPROT 24.1*  --   --   --   --   --   --   --  17.4*  --   --   INR 2.1*  --   --   --   --   --   --   --  1.4*  --   --   HEPARINUNFRC  --   --   --   --   --   --   --   --  0.50  --  0.47  CREATININE 4.90*  --  4.17*  --  3.82*  --   --  2.42*  --   --   --    < > = values in this interval not displayed.    Estimated Creatinine Clearance: 18.6 mL/min (A) (by C-G formula based on SCr of 2.42 mg/dL (H)).   Medical History: Past Medical History:  Diagnosis Date   AC (acromioclavicular) joint bone spurs, right    Anemia    Arthritis    Atrial fibrillation (HCC)    a.) CHA2DS2-VASc = 7 (age, sex, HFrEF, HTN, TIA x 2, T2DM). b.) rate/rhythm controlled on oral carvedilol; chronically anticoagulated with warfarin.   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 b.) R/LHC 03/29/2017: small RCA with occluded SVG; no significant Dz. Chronically occluded LAD with patent SVG  to D1/LAD. Insignificant Dz in LCx. LM normal.   Cortical cataract    DOE (dyspnea on exertion)    DOE (dyspnea on exertion)    GERD (gastroesophageal reflux disease)    HFrEF (heart failure with reduced ejection fraction) (HCC)    a.) TTE 10/27/2014: EF 40%; glob HK; mild BAE; triv AR, mild MR/PR, mod TR.  b.) TTE 03/15/2017: EF 30%; glob HK; mod BAE; mod pHTN (RVSP 54.8 mmHg); mild PR, mod MR; sev TR.  c.) R/LHC 03/29/2017: EF 30-35%. d.)TTE 10/25/2018: EF 35%; mod BAE; mod BVE; glob HK; triv PR, mod MR, sev TR; RVSP 57.7 mmHg; G2DD.  History of shingles 2004   Hyperlipidemia    Hypertension    Hypothyroidism    IBS (irritable bowel syndrome)    Ischemic cardiomyopathy    a.) TTE 10/27/2014: EF 40%. b.) TTE 03/15/2017: EF 30%. c.) R/LHC 04/01/2017: EF 30-35%. d.) TTE 10/25/2018: EF 35%   Lumbar scoliosis    Lumbar spinal stenosis    Migraines    Osteoporosis    Pulmonary HTN (HCC)    a.) TTE 03/15/2017: EF 30-35%; RVSP 54.8 mmHg. 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 Last dose PTA was 4/1. Currently being held due to anemia and low Hgb.  Assessment: 68 y/o female with PMH of HLD, HTN, T2DM, HFrEF, ischemic cardiomyopathy, PAF s/p watchman device, TIA, CAD s/p CABG x 3, PAF, iron deficiency anemia, cirrhosis, CKD III, recent C. Diff infection. CHA2DS2-VASc 7. Patient is admitted for recurrent severe sepsis with shock secondary to C.Diff infection, AKI, and pancolitis. Hgb trending down s/p exploratory laparotomy on 4/2 (10.3> 8.1> 7.6> 8.0). Platelets trending down 279 > 209 > 115 > 126.   Goal of Therapy:  Heparin level 0.3-0.5 units/ml Monitor platelets by anticoagulation protocol: Yes  4/05 2212  HL 0.47, therapeutic x 1   Plan:  -Continue heparin infusion 700 units/hr -Recheck HL daily w/ AM labs while therapeutic -CBC daily while on heparin - Monitor for s/sx of bleeding   Otelia Sergeant, PharmD, [facility] 03/02/2024 10:45 PM

## 2024-03-02 NOTE — Consult Note (Signed)
 PHARMACY - ANTICOAGULATION CONSULT NOTE  Pharmacy Consult for heparin Indication: atrial fibrillation  Allergies  Allergen Reactions   Vioxx [Rofecoxib] Swelling    Patient Measurements: Height: 5\' 1"  (154.9 cm) Weight: 62.6 kg (138 lb 0.1 oz) IBW/kg (Calculated) : 47.8 HEPARIN DW (KG): 60.2  Vital Signs: Temp: 98.1 F (36.7 C) (04/05 0803) Temp Source: Oral (04/05 0803) BP: 100/65 (04/05 0900) Pulse Rate: 103 (04/05 1015)  Labs: Recent Labs    02/28/24 1451 02/28/24 1528 02/29/24 0431 02/29/24 1137 02/29/24 1652 02/29/24 1748 03/01/24 0452 03/01/24 0701 03/01/24 1923 03/02/24 0024 03/02/24 0610  HGB 10.2*   < > 9.3*   < >  --    < > 8.3*   < > 7.6* 7.9* 8.0*  HCT 30.4*   < > 27.5*   < >  --    < > 23.9*   < > 22.9* 23.4* 23.5*  PLT  --    < > 209  --   --   --  115*  --   --   --  126*  LABPROT 31.8*  --  24.1*  --   --   --   --   --   --   --   --   INR 3.0*  --  2.1*  --   --   --   --   --   --   --   --   CREATININE  --    < > 4.90*  --  4.17*  --  3.82*  --   --   --  2.42*   < > = values in this interval not displayed.    Estimated Creatinine Clearance: 18.6 mL/min (A) (by C-G formula based on SCr of 2.42 mg/dL (H)).   Medical History: Past Medical History:  Diagnosis Date   AC (acromioclavicular) joint bone spurs, right    Anemia    Arthritis    Atrial fibrillation (HCC)    a.) CHA2DS2-VASc = 7 (age, sex, HFrEF, HTN, TIA x 2, T2DM). b.) rate/rhythm controlled on oral carvedilol; chronically anticoagulated with warfarin.   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 b.) R/LHC 03/29/2017: small RCA with occluded SVG; no significant Dz. Chronically occluded LAD with patent SVG to D1/LAD. Insignificant Dz in LCx. LM normal.   Cortical cataract    DOE (dyspnea on exertion)    DOE (dyspnea on  exertion)    GERD (gastroesophageal reflux disease)    HFrEF (heart failure with reduced ejection fraction) (HCC)    a.) TTE 10/27/2014: EF 40%; glob HK; mild BAE; triv AR, mild MR/PR, mod TR.  b.) TTE 03/15/2017: EF 30%; glob HK; mod BAE; mod pHTN (RVSP 54.8 mmHg); mild PR, mod MR; sev TR.  c.) R/LHC 03/29/2017: EF 30-35%. d.)TTE 10/25/2018: EF 35%; mod BAE; mod BVE; glob HK; triv PR, mod MR, sev TR; RVSP 57.7 mmHg; G2DD.   History of shingles 2004   Hyperlipidemia    Hypertension    Hypothyroidism    IBS (irritable bowel syndrome)    Ischemic cardiomyopathy    a.) TTE 10/27/2014: EF 40%. b.) TTE 03/15/2017: EF 30%. c.) R/LHC 04/01/2017: EF 30-35%. d.) TTE 10/25/2018: EF 35%   Lumbar scoliosis    Lumbar spinal stenosis    Migraines  Osteoporosis    Pulmonary HTN (HCC)    a.) TTE 03/15/2017: EF 30-35%; RVSP 54.8 mmHg. 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 Last dose PTA was 4/1. Currently being held due to anemia and low Hgb.  Assessment: 68 y/o female with PMH of HLD, HTN, T2DM, HFrEF, ischemic cardiomyopathy, PAF s/p watchman device, TIA, CAD s/p CABG x 3, PAF, iron deficiency anemia, cirrhosis, CKD III, recent C. Diff infection. CHA2DS2-VASc 7. Patient is admitted for recurrent severe sepsis with shock secondary to C.Diff infection, AKI, and pancolitis. Hgb trending down s/p exploratory laparotomy on 4/2 (10.3> 8.1> 7.6> 8.0). Platelets trending down 279 > 209 > 115 > 126.   Goal of Therapy:  Heparin level 0.3-0.5 units/ml Monitor platelets by anticoagulation protocol: Yes   Plan:  - No heparin bolus  - Start heparin infusion at 700 units/hr - 8 hour heparin level - CBC daily  - Monitor for s/sx  of bleeding    Karlton Lemon, PharmD Candidate  03/02/2024,10:44 AM

## 2024-03-03 DIAGNOSIS — A419 Sepsis, unspecified organism: Secondary | ICD-10-CM | POA: Diagnosis not present

## 2024-03-03 DIAGNOSIS — K5100 Ulcerative (chronic) pancolitis without complications: Secondary | ICD-10-CM | POA: Diagnosis not present

## 2024-03-03 DIAGNOSIS — A0472 Enterocolitis due to Clostridium difficile, not specified as recurrent: Secondary | ICD-10-CM | POA: Diagnosis not present

## 2024-03-03 DIAGNOSIS — R6521 Severe sepsis with septic shock: Secondary | ICD-10-CM | POA: Diagnosis not present

## 2024-03-03 LAB — HEMOGLOBIN AND HEMATOCRIT, BLOOD
HCT: 25.4 % — ABNORMAL LOW (ref 36.0–46.0)
Hemoglobin: 8.3 g/dL — ABNORMAL LOW (ref 12.0–15.0)

## 2024-03-03 LAB — TYPE AND SCREEN
ABO/RH(D): A NEG
Antibody Screen: POSITIVE
Unit division: 0
Unit division: 0

## 2024-03-03 LAB — MAGNESIUM: Magnesium: 1.7 mg/dL (ref 1.7–2.4)

## 2024-03-03 LAB — GLUCOSE, CAPILLARY
Glucose-Capillary: 128 mg/dL — ABNORMAL HIGH (ref 70–99)
Glucose-Capillary: 103 mg/dL — ABNORMAL HIGH (ref 70–99)
Glucose-Capillary: 167 mg/dL — ABNORMAL HIGH (ref 70–99)
Glucose-Capillary: 260 mg/dL — ABNORMAL HIGH (ref 70–99)
Glucose-Capillary: 250 mg/dL — ABNORMAL HIGH (ref 70–99)
Glucose-Capillary: 202 mg/dL — ABNORMAL HIGH (ref 70–99)

## 2024-03-03 LAB — CBC
HCT: 25.8 % — ABNORMAL LOW (ref 36.0–46.0)
Hemoglobin: 8.6 g/dL — ABNORMAL LOW (ref 12.0–15.0)
MCH: 31.3 pg (ref 26.0–34.0)
MCHC: 33.3 g/dL (ref 30.0–36.0)
MCV: 93.8 fL (ref 80.0–100.0)
Platelets: 143 10*3/uL — ABNORMAL LOW (ref 150–400)
RBC: 2.75 MIL/uL — ABNORMAL LOW (ref 3.87–5.11)
RDW: 17.5 % — ABNORMAL HIGH (ref 11.5–15.5)
WBC: 14.5 10*3/uL — ABNORMAL HIGH (ref 4.0–10.5)
nRBC: 2.8 % — ABNORMAL HIGH (ref 0.0–0.2)

## 2024-03-03 LAB — BPAM RBC
Blood Product Expiration Date: 202504152359
Blood Product Expiration Date: 202504162359
Unit Type and Rh: 5100
Unit Type and Rh: 600

## 2024-03-03 LAB — RENAL FUNCTION PANEL
Albumin: 2.9 g/dL — ABNORMAL LOW (ref 3.5–5.0)
Anion gap: 7 (ref 5–15)
BUN: 41 mg/dL — ABNORMAL HIGH (ref 8–23)
CO2: 29 mmol/L (ref 22–32)
Calcium: 8.6 mg/dL — ABNORMAL LOW (ref 8.9–10.3)
Chloride: 101 mmol/L (ref 98–111)
Creatinine, Ser: 1.25 mg/dL — ABNORMAL HIGH (ref 0.44–1.00)
GFR, Estimated: 47 mL/min — ABNORMAL LOW (ref >60)
Glucose, Bld: 101 mg/dL — ABNORMAL HIGH (ref 70–99)
Phosphorus: 1.3 mg/dL — ABNORMAL LOW (ref 2.5–4.6)
Potassium: 4.6 mmol/L (ref 3.5–5.1)
Sodium: 137 mmol/L (ref 135–145)

## 2024-03-03 LAB — HEPARIN LEVEL (UNFRACTIONATED): Heparin Unfractionated: 0.41 [IU]/mL (ref 0.30–0.70)

## 2024-03-03 MED ORDER — PANTOPRAZOLE SODIUM 40 MG PO TBEC
40.0000 mg | DELAYED_RELEASE_TABLET | Freq: Every day | ORAL | Status: DC
  Administered 2024-03-04 – 2024-03-12 (×10): 40 mg via ORAL
  Filled 2024-03-03 (×10): qty 1

## 2024-03-03 MED ORDER — MIDODRINE HCL 5 MG PO TABS
5.0000 mg | ORAL_TABLET | Freq: Three times a day (TID) | ORAL | Status: DC
  Administered 2024-03-03 – 2024-03-05 (×7): 5 mg via ORAL
  Filled 2024-03-03 (×7): qty 1

## 2024-03-03 MED ORDER — ACETAMINOPHEN 10 MG/ML IV SOLN
1000.0000 mg | Freq: Four times a day (QID) | INTRAVENOUS | Status: AC
  Administered 2024-03-03 – 2024-03-04 (×4): 1000 mg via INTRAVENOUS
  Filled 2024-03-03 (×4): qty 100

## 2024-03-03 MED ORDER — MAGNESIUM SULFATE 2 GM/50ML IV SOLN
2.0000 g | Freq: Once | INTRAVENOUS | Status: AC
  Administered 2024-03-03: 2 g via INTRAVENOUS
  Filled 2024-03-03: qty 50

## 2024-03-03 NOTE — Progress Notes (Signed)
 The patient able to move legs over to edge of bed independently. Patient transitioned to sitting position with one assist. Patient able to do much of the work. No complaints of dizziness. No change in BP. Patient able to stand using rolling walker. Patient marched in place at bedside. Patient states she has a rolling walker at home from previous ortho surgery. Patient tolerated activity well. Order placed for PT/OT evaluation.

## 2024-03-03 NOTE — Progress Notes (Signed)
 [facility]  PROGRESS NOTE   Subjective:   Awake and alert.  Vitals are stable.  Urine output is excellent.  Objective:  Vital signs: Blood pressure 107/62, pulse 91, temperature 98 F (36.7 C), temperature source Oral, resp. rate 12, height 5\' 1"  (1.549 m), weight 63.9 kg, SpO2 98%.  Intake/Output Summary (Last 24 hours) at 03/03/2024 1144 Last data filed at 03/03/2024 0700 Gross per 24 hour  Intake 2814.79 ml  Output 2325 ml  Net 489.79 ml   Filed Weights   02/28/24 0515 02/29/24 0308 03/03/24 0400  Weight: 61.3 kg 62.6 kg 63.9 kg     Physical Exam: General:  No acute distress  Head:  Normocephalic, atraumatic. Moist oral mucosal membranes  Eyes:  Anicteric  Neck:  Supple  Lungs:   Clear to auscultation, normal effort  Heart:  S1S2 no rubs  Abdomen:   Soft, nontender, bowel sounds present  Extremities:  peripheral edema.  Neurologic:  Awake, alert, following commands  Skin:  No lesions  Access:     Basic Metabolic Panel: Recent Labs  Lab 02/28/24 0608 02/28/24 1026 02/29/24 0431 02/29/24 1652 03/01/24 0452 03/02/24 0610 03/03/24 0531  NA 124*   < > 131* 132* 130* 133* 137  K 3.7   < > 3.7 3.2* 3.6 3.2* 4.6  CL 89*   < > 88* 89* 90* 95* 101  CO2 9*   < > 16* 25 25 28 29   GLUCOSE 228*   < > 200* 147* 148* 235* 101*  BUN 84*   < > 76* 74* 68* 58* 41*  CREATININE 6.17*   < > 4.90* 4.17* 3.82* 2.42* 1.25*  CALCIUM 6.8*   < > 7.6* 7.7* 8.4* 8.5* 8.6*  MG 1.6*  --  1.5*  --  2.0 2.0 1.7  PHOS 7.9*  --  6.3*  --  4.4 3.3 1.3*   < > = values in this interval not displayed.   GFR: Estimated Creatinine Clearance: 36.3 mL/min (A) (by C-G formula based on SCr of 1.25 mg/dL (H)).  Liver Function Tests: Recent Labs  Lab 02/27/24 1901 02/28/24 0608 02/28/24 1528 02/29/24 0431 03/02/24 0610 03/03/24 0531  AST 40 33 32  --   --   --   ALT 19 20 19   --   --   --   ALKPHOS 139* 109 106  --   --   --   BILITOT 0.9 1.2 0.9  --   --   --   PROT 6.6  5.8* 5.8*  --   --   --   ALBUMIN 3.1* 2.8* 2.9* 3.5 2.7* 2.9*   Recent Labs  Lab 02/27/24 1901  LIPASE 25   No results for input(s): "AMMONIA" in the last 168 hours.  CBC: Recent Labs  Lab 02/27/24 1901 02/28/24 0156 02/28/24 1528 02/28/24 1749 02/29/24 0431 02/29/24 1137 03/01/24 0452 03/01/24 0701 03/02/24 0024 03/02/24 0610 03/02/24 1727 03/03/24 0004 03/03/24 0531  WBC 36.5*   < > 32.9*  --  19.3*  --  13.5*  --   --  12.1*  --   --  14.5*  NEUTROABS 33.3*  --  29.6*  --   --   --   --   --   --   --   --   --   --   HGB 12.6   < > 10.2*   < > 9.3*   < > 8.3*   < > 7.9* 8.0* 7.7* 8.3*  8.6*  HCT 39.2   < > 30.6*   < > 27.5*   < > 23.9*   < > 23.4* 23.5* 23.3* 25.4* 25.8*  MCV 96.3   < > 95.0  --  94.2  --  90.9  --   --  95.5  --   --  93.8  PLT 279   < > 268  --  209  --  115*  --   --  126*  --   --  143*   < > = values in this interval not displayed.     HbA1C: Hgb A1c MFr Bld  Date/Time Value Ref Range Status  02/28/2024 06:08 AM 5.9 (H) 4.8 - 5.6 % Final    Comment:    (NOTE) Pre diabetes:          5.7%-6.4%  Diabetes:              >6.4%  Glycemic control for   <7.0% adults with diabetes   06/07/2023 02:58 AM 6.3 (H) 4.8 - 5.6 % Final    Comment:    (NOTE)         Prediabetes: 5.7 - 6.4         Diabetes: >6.4         Glycemic control for adults with diabetes: <7.0     Urinalysis: No results for input(s): "COLORURINE", "LABSPEC", "PHURINE", "GLUCOSEU", "HGBUR", "BILIRUBINUR", "KETONESUR", "PROTEINUR", "UROBILINOGEN", "NITRITE", "LEUKOCYTESUR" in the last 72 hours.  Invalid input(s): "APPERANCEUR"    Imaging: No results found.   Medications:    acetaminophen     heparin 700 Units/hr (03/03/24 0700)   magnesium sulfate bolus IVPB     norepinephrine (LEVOPHED) Adult infusion 1 mcg/min (03/03/24 0700)   piperacillin-tazobactam (ZOSYN)  IV Stopped (03/03/24 0555)    Chlorhexidine Gluconate Cloth  6 each Topical Daily   feeding supplement   237 mL Oral TID BM   insulin aspart  0-20 Units Subcutaneous Q4H   latanoprost  1 drop Both Eyes QHS   levothyroxine  50 mcg Oral Q0600   lidocaine  1 patch Transdermal Q0600   midodrine  5 mg Oral TID WC    Assessment/ Plan:     68 y.o. female with  medical problems of coronary disease status post CABG, ischemic cardiomyopathy, history of atrial fibrillation status post Watchman procedure, pulmonary hypertension  admitted on 02/27/2024 for Pancolitis and septic shock.  Subsequently patient had total colectomy with ileostomy.      1.  Acute kidney injury on chronic kidney disease stage IIIa (06/06/2023) AKI likely secondary to severe ATN from concurrent illness.  Presently in diuretic phase of ATN. Creatinine critically elevated at 7.04 on admission which has steadily improved and is down to 1.24 today.  Patient is nonoliguric.  Electrolytes and volume status are acceptable.   2.  Severe C. difficile colitis, status post total colectomy and end ileostomy, septic shock -Currently in recovery phase.  Management as per surgery team.   3.  Chronic systolic CHF, ischemic cardiomyopathy EF 35%, atrial fibrillation, pulmonary hypertension -Management as per cardiology team. Currently requiring pressors- norepinephrine.   4.  Hyponatremia:Likely due to AKI and acute illness.Expected to improve as renal function steadily improves.   5.  Hypokalemia: Potassium replacement as per ICU protocols.  #6: Metabolic acidosis: Now improved with improvement in renal function and sepsis.  Labs and medications reviewed. Will continue to follow along with you.   LOS: 5 Lorain Childes, MD [facility]  4/6/202511:44 AM

## 2024-03-03 NOTE — Plan of Care (Signed)

## 2024-03-03 NOTE — Consult Note (Addendum)
 PHARMACY - ANTICOAGULATION CONSULT NOTE  Pharmacy Consult for heparin Indication: atrial fibrillation  Allergies  Allergen Reactions   Vioxx [Rofecoxib] Swelling    Patient Measurements: Height: 5\' 1"  (154.9 cm) Weight: 63.9 kg (140 lb 14 oz) IBW/kg (Calculated) : 47.8 HEPARIN DW (KG): 60.2  Vital Signs: Temp: 98 F (36.7 C) (04/06 0400) Temp Source: Oral (04/06 0400) BP: 107/62 (04/06 0400) Pulse Rate: 96 (04/06 0630)  Labs: Recent Labs    02/29/24 1652 02/29/24 1748 03/01/24 0452 03/01/24 0701 03/02/24 0610 03/02/24 1141 03/02/24 1727 03/02/24 2212 03/03/24 0004 03/03/24 0531  HGB  --    < > 8.3*   < > 8.0*  --  7.7*  --  8.3* 8.6*  HCT  --    < > 23.9*   < > 23.5*  --  23.3*  --  25.4* 25.8*  PLT  --   --  115*  --  126*  --   --   --   --  143*  APTT  --   --   --   --   --  40*  --   --   --   --   LABPROT  --   --   --   --   --  17.4*  --   --   --   --   INR  --   --   --   --   --  1.4*  --   --   --   --   HEPARINUNFRC  --   --   --   --   --  0.50  --  0.47  --  0.41  CREATININE 4.17*  --  3.82*  --  2.42*  --   --   --   --   --    < > = values in this interval not displayed.    Estimated Creatinine Clearance: 18.8 mL/min (A) (by C-G formula based on SCr of 2.42 mg/dL (H)).   Medical History: Past Medical History:  Diagnosis Date   AC (acromioclavicular) joint bone spurs, right    Anemia    Arthritis    Atrial fibrillation (HCC)    a.) CHA2DS2-VASc = 7 (age, sex, HFrEF, HTN, TIA x 2, T2DM). b.) rate/rhythm controlled on oral carvedilol; chronically anticoagulated with warfarin.   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 b.) R/LHC 03/29/2017: small RCA with occluded SVG; no significant Dz. Chronically occluded LAD with patent SVG to D1/LAD. Insignificant Dz in LCx. LM normal.   Cortical  cataract    DOE (dyspnea on exertion)    DOE (dyspnea on exertion)    GERD (gastroesophageal reflux disease)    HFrEF (heart failure with reduced ejection fraction) (HCC)    a.) TTE 10/27/2014: EF 40%; glob HK; mild BAE; triv AR, mild MR/PR, mod TR.  b.) TTE 03/15/2017: EF 30%; glob HK; mod BAE; mod pHTN (RVSP 54.8 mmHg); mild PR, mod MR; sev TR.  c.) R/LHC 03/29/2017: EF 30-35%. d.)TTE 10/25/2018: EF 35%; mod BAE; mod BVE; glob HK; triv PR, mod MR, sev TR; RVSP 57.7 mmHg; G2DD.   History of shingles 2004   Hyperlipidemia    Hypertension    Hypothyroidism    IBS (irritable bowel syndrome)  Ischemic cardiomyopathy    a.) TTE 10/27/2014: EF 40%. b.) TTE 03/15/2017: EF 30%. c.) R/LHC 04/01/2017: EF 30-35%. d.) TTE 10/25/2018: EF 35%   Lumbar scoliosis    Lumbar spinal stenosis    Migraines    Osteoporosis    Pulmonary HTN (HCC)    a.) TTE 03/15/2017: EF 30-35%; RVSP 54.8 mmHg. 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 Last dose PTA was 4/1. Currently being held due to anemia and low Hgb.  Assessment: 68 y/o female with PMH of HLD, HTN, T2DM, HFrEF, ischemic cardiomyopathy, PAF s/p watchman device, TIA, CAD s/p CABG x 3, PAF, iron deficiency anemia, cirrhosis, CKD III, recent C. Diff infection. CHA2DS2-VASc 7. Patient is admitted for recurrent severe sepsis with shock secondary to C.Diff infection, AKI, and pancolitis. Hgb trending down s/p exploratory laparotomy on 4/2 (10.3> 8.1> 7.6> 8.0). Platelets trending down 279 > 209 > 115 > 126.   Goal of Therapy:  Heparin level 0.3-0.5 units/ml Monitor platelets by anticoagulation protocol: Yes  4/05 2212 HL 0.47, therapeutic x 2 4/05 0531 HL 0.41, therapeutic x  3   Plan:  -Continue heparin infusion 700 units/hr -Recheck HL daily w/ AM labs while therapeutic -CBC daily while on heparin - Monitor for s/sx of bleeding   Otelia Sergeant, PharmD, [facility] 03/03/2024 6:40 AM

## 2024-03-03 NOTE — Plan of Care (Signed)

## 2024-03-03 NOTE — Consult Note (Signed)
 PHARMACY CONSULT NOTE - FOLLOW UP  Pharmacy Consult for Electrolyte Monitoring and Replacement   Recent Labs: Potassium (mmol/L)  Date Value  03/03/2024 4.6   Magnesium (mg/dL)  Date Value  16/08/9603 1.7   Calcium (mg/dL)  Date Value  54/07/8118 8.6 (L)   Albumin (g/dL)  Date Value  14/78/2956 2.9 (L)  05/18/2020 4.7   Phosphorus (mg/dL)  Date Value  21/30/8657 1.3 (L)   Sodium (mmol/L)  Date Value  03/03/2024 137     Assessment: 68 y/o female with h/o ischemic cardiomyopathy, gastric AVM, GI angiodysplasia, IBS-D, IDA, s/p watchman device, cholelithiasis, TIA, hypothyroidism, pulmonary hypertension, GERD, barrett's esophagus, HLD, HTN, OSA, CAD s/p CABG x 3, PAF, CHF, cirrhosis, portal hypertension, gout, CKD III, B12 deficiency, ventral hernia s/p mesh repair 2018, colonic angioectasias, diverticulosis, DDD, hiatal hernia, esophageal varices and recent admission to [facility] with C-diff and CHF who is admitted with sepsis, pancolitis, AKI and GIB.   Goal of Therapy:  K+ > 4 Mg > 2  Plan:  Mg 2 g IV x 1 F/u with AM labs.   Ronnald Ramp ,PharmD Clinical Pharmacist 03/03/2024 10:24 AM

## 2024-03-03 NOTE — Progress Notes (Signed)
 [facility] SURGICAL PROGRESS NOTE  Hospital Day(s): 5.   Post op day(s): 4 Days Post-Op.   Interval History:  Patient seen and examined She is sitting up in bed and looks comfortable. Pressors: 1 of levo, likely can d/c today. Tolerated cld but reports feeling bloated and belching. Having liquid stool out of ostomy.   Vital signs in last 24 hours: [min-max] current  Temp:  [98 F (36.7 C)-98.6 F (37 C)] 98 F (36.7 C) (04/06 0400) Pulse Rate:  [90-136] 91 (04/06 0700) Resp:  [8-28] 12 (04/06 0700) BP: (95-114)/(51-75) 107/62 (04/06 0400) SpO2:  [94 %-100 %] 98 % (04/06 0700) Arterial Line BP: (84-122)/(43-63) 101/52 (04/06 0700) Weight:  [63.9 kg] 63.9 kg (04/06 0400)     Height: 5\' 1"  (154.9 cm) Weight: 63.9 kg BMI (Calculated): 26.63   Intake/Output last 2 shifts:  04/05 0701 - 04/06 0700 In: 2989.4 [P.O.:250; I.V.:1916; IV Piggyback:823.4] Out: 2550 [Urine:2325; Stool:225]   Physical Exam:  Constitutional: Arouses appropriately, NAD Respiratory: On [place], no respiratory distress Cardiovascular: RR Gastrointestinal: soft, expected tenderness, non-distended, no rebound/guarding. Ileostomy in RLQ; more liquid stool in bag  Genitourinary: Foley in place; good UO  Integumentary: Laparotomy is CDI with staples, drainage inferiorly   Labs:     Latest Ref Rng & Units 03/03/2024    5:31 AM 03/03/2024   12:04 AM 03/02/2024    5:27 PM  CBC  WBC 4.0 - 10.5 K/uL 14.5     Hemoglobin 12.0 - 15.0 g/dL 8.6  8.3  7.7   Hematocrit 36.0 - 46.0 % 25.8  25.4  23.3   Platelets 150 - 400 K/uL 143         Latest Ref Rng & Units 03/03/2024    5:31 AM 03/02/2024    6:10 AM 03/01/2024    4:52 AM  CMP  Glucose 70 - 99 mg/dL 161  096  045   BUN 8 - 23 mg/dL 41  58  68   Creatinine 0.44 - 1.00 mg/dL 4.09  8.11  9.14   Sodium 135 - 145 mmol/L 137  133  130   Potassium 3.5 - 5.1 mmol/L 4.6  3.2  3.6   Chloride 98 - 111 mmol/L 101  95  90   CO2 22 - 32 mmol/L 29  28  25    Calcium  8.9 - 10.3 mg/dL 8.6  8.5  8.4     Imaging studies: No new pertinent imaging studies   Assessment/Plan:  68 y.o. female 4 Days Post-Op s/p exploratory laparotomy, total colectomy, and end ileostomy creation for septic shock secondary to C diff colitis   - Fortunately, overall septic picture/shock improving, vasopressor need weaning and can likely be completely d/ced today, labs improved. Appreciate PCCM assistance.   - Continue TPN, given belching and bloating will hold at [facility] today, if feels much better this afternoon can advance to FLD  - Continue IV Abx (Zosyn, Flagyl); appreciate ID input   - Recommend d/c foley catheter; monitor UO - Monitor abdominal examination - Monitor ileostomy output  - Engage WOC RN for ostomy care/teaching              - Needs to get up and mobilize today.  Recommend PT OT consult

## 2024-03-03 NOTE — Progress Notes (Signed)
 NAME:  Tammy Arias, MRN:  [id number], DOB:  Apr 04, 1956, LOS: 5 ADMISSION DATE:  02/27/2024, CHIEF COMPLAINT:  Septic Shock   History of Present Illness:   68 yo F presenting to [facility] ED from home for evaluation of diarrhea and vomiting since 02/25/24.   History obtained per chart review and patient bedside report. Patient has had multiple hospitalizations since January 2025 with GI bleeding s/p clipping, watchman device placement and most recently acute CHF exacerbation from 01/26/24- 02/13/24.  During her most recent admission, she required loop diuretics as well as inotrope assistance for adequate diuresis. She was changed from entresto to valsartan due to hypotension and her BB was stopped. She was continued on Eliquis post watchman device per EP recommendations after 6 week TEE on 01/23/24 noted a small flow jet from the LAA back into the LA proper at the edge of the inferior aspect of the occluder device.  She also tested positive for C.Diff completing a 10 day course of PO vancomycin for treatment on 02/10/24.   After discharge she was in her normal state of health until noticing recurrent diarrhea on 02/17/24, which she thought might just be an IBS flare. Diarrhea however worsened on 02/21/24 which got much worse on 3/28 with the addition of persistent non-bloody bilious emesis. Her best friend, who has been helping care for her stated she noticed some blood in her stool that seemed to be from the patient's hemorrhoids. However this bleeding significantly increased on 02/27/24 and the patient has had at least 2 bloody bowel movements. The patient also endorses generalized weakness and fatigue, diffuse abdominal tenderness, blurred vision with green spots. She denies chest pain, dyspnea, urinary symptoms, fever/chills, LOC or palpitations. She did fall about 3 days ago, landing on her buttocks, she denies hitting her head.   She reports continuing to take all her prescribed medications including her  valsartan, torsemide and Eliquis.   ED course: Upon arrival patient significantly hypotensive and drowsy. Sepsis protocol initiated and due to sever hypotension, central venous catheter placed and vasopressor started. Labs significant for hyponatremia, hypochloremia, AGMA, AKI on CKD, elevated Alk Phos, leukocytosis and significant lactic acidosis with fecal occult positive. Small amount of hematochezia noted in ED. Imaging significant for pan colitis without signs of toxic megacolon and cholelithiasis without cholecystis. Medications given: cefepime/vancomycin/flagyl, levophed drip, 2.75 L IVF bolus Initial Vitals: 97.4, 15, 90, 62/35 & 100% on RA  C.Diff testing: Positive   CXR 02/27/24: no acute cardiopulmonary disease CT abdomen/pelvis wo contrast 02/27/24: Diffuse colonic wall thickening and surrounding inflammation compatible with pancolitis. Cholelithiasis.   PCCM consulted for admission due to severe sepsis with shock secondary to recurrent C.Diff colitis requiring vasopressor support.  Pertinent  Medical History   CAD s/p CABG & ASD repair (2005) HFrEF (LVEF 30%) ICM Pulmonary HTN Heart Murmur Atrial Fibrillation s/p Watchman device (11/2023) Hypothyroidism HLD CKD stage 3b (baseline Cr 1.0) HTN Iron deficiency Anemia Angiodysplasia of the intestinal tract Diverticulosis GI bleeding due to AVM s/p clipping (11/2023) OSA on CPAP T2DM TIA Gout Former Smoker (20+ pack history) Severe MR/TR Pancytopenia  Significant Hospital Events: Including procedures, antibiotic start and stop dates in addition to other pertinent events   02/27/24: Admit to ICU with severe sepsis and shock secondary to recurrent C.Diff colitis requiring vasopressor support. 02/28/24: on two pressors, rising lactic acidosis, surgery consulted, underwent colectomy 02/29/24: awake and follows commands off sedation, vasopressor requirements improved, extubated 03/01/24: remains weak but continues to improve, on low  dose  nor-epinephrine 03/02/24: pressor requirements improving, denies pain, tolerating sips 03/03/24: Vasopressor requirements continue to downtrend.  Tolerating clear liquids.   Interim History / Subjective:  Awake and alert.  Continues to improve.  Feels weak.  Objective   Blood pressure 107/62, pulse 91, temperature 98 F (36.7 C), temperature source Oral, resp. rate 12, height 5\' 1"  (1.549 m), weight 63.9 kg, SpO2 98%.        Intake/Output Summary (Last 24 hours) at 03/03/2024 0957 Last data filed at 03/03/2024 0700 Gross per 24 hour  Intake 2917.72 ml  Output 2325 ml  Net 592.72 ml   Filed Weights   02/28/24 0515 02/29/24 0308 03/03/24 0400  Weight: 61.3 kg 62.6 kg 63.9 kg    Examination: Physical Exam Constitutional:      General: She is not in acute distress.    Appearance: She is ill-appearing.  Cardiovascular:     Rate and Rhythm: Regular rhythm. Tachycardia present.     Pulses: Normal pulses.     Heart sounds: Normal heart sounds.  Pulmonary:     Effort: Pulmonary effort is normal.     Breath sounds: Normal breath sounds.  Abdominal:     Palpations: Abdomen is soft.     Comments: End ileostomy in place  Neurological:     Mental Status: She is alert. She is disoriented.     Motor: Weakness present.       Assessment & Plan:   Neurology #Abdominal Pain #Post operative ventilator requirement  Pain control with Tylenol and lidocaine patch.  Extubated and off analgosedation.  -IV tylenol every 6 hours -PRN narcotics for severe pain  Cardiovascular #Septic shock #Heart failure with reduced ejection fraction #Atrial fibrillation  #s/p Watchman procedure (11/2023) #CAD status post CABG  Recently admitted to [facility] 2/28 - 3/18 with decompensated heart failure and significant volume overload.  Right heart cath showed RA 26, PA 60/22 (40), PCWP 30, CI 2.0, PVR 2.9 and aggressively diuresed.  Repeat right heart cath on 3/12 showed elevated pressures but the  results are not available. Her echocardiography is showing severe mitral regurgitation and tricuspid regurgitation with plan for outpatient MitraClip and tri-clip placement.  She was discharged on goal-directed medical therapy presents now with hypotension.  Patient with overall presentation that is consistent with septic shock secondary to C. difficile colitis. Given rising lactic acid and pressor requirements she underwent emergent colectomy, now with improvement in perfusion. Appreciate input from cardiology today. Co-ox was re-assuring, unlikely in cardiogenic shock. Vasopressor requirements have been downtrending with clearance of her lactic acid.   -goal MAP > 65; wean nor-epi as tolerated -lactic acid normalized -cardiology consult appreciated -hold GDMT, will resume as kidney function and hemodynamics continue to improved.  Pulmonary #Post operative vent requirements  Extubated to nasal cannula, tolerating well without issues. ABG re-assuring.  Gastrointestinal #Severe C. difficile colitis #Hematochezia #Diverticulosis #Colonic AVM  Presents with hematochezia and recurrent diarrhea since Friday.  Patient recently admitted to [facility] where she developed C. difficile colitis treated with oral vancomycin. She's also been admitted to [facility] in January with GI bleed where she was found to have AVM's as well as diverticulosis.  Comes in now with worsening diarrhea and shock with imaging notable for diffuse pancolitis, abdominal pain, and concern for peritonitis. This is consistent with severe C. Diff colitis with hemodynamic instability and septic shock and has undergone colectomy and end ileostomy. She is improving well post procedure.  -surgery consult appreciated -advance diet as tolerated and in accordance  with surgery recommendations -continue PPI -TPN per surgery  Renal #AKI #Metabolic acidosis  Presents with severe kidney injury in the setting of shock as well as  continued use of her outpatient GDMT with direct nephrotoxicity as well as pre-renal azotemia and likely ATN. Kidney function continues to improve, creatinine down to 1.25 today and with good urine output.  Suspect ATN with post ATN diuresis and we have replaced her lost fluids.   -avoid nephrotoxins -goal MAP > 65 mmHg -hold GDMT -Appreciate input from nephrology  Endocrine  ICU glycemic protocol.  Hem/Onc #Afib on Eliquis #Acute Blood loss anemia  Presents with hematochezia due to colitis. Was on Apixaban for afib as well as for continued anti-coagulation post watchman placement. FFP's administered prior to surgery.  -Resumed heparin drip -monitor CBC  ID # Septic shock #Severe C. difficile colitis  Presents with severe C. difficile colitis with hematochezia and bloody diarrhea.  Previously treated with oral vancomycin during her recent hospitalization at [facility].  Given hemodynamic instability, started on oral vancomycin (500 mg every 6 hours) as well as IV metronidazole. Oral vancomycin held given colectomy for source control and NPO status, continued on IV metronidazole. Continued on broad spectrum antibiotics.   Best Practice (right click and "Reselect all SmartList Selections" daily)   Diet/type: clear liquids DVT prophylaxis other; active bleed, surgery, held Pressure ulcer(s): N/A GI prophylaxis: PPI Lines: Central line and Arterial Line Foley:  Yes, and it is still needed Code Status:  full code Last date of multidisciplinary goals of care discussion [03/03/2024]  Labs   CBC: Recent Labs  Lab 02/27/24 1901 02/28/24 0156 02/28/24 1528 02/28/24 1749 02/29/24 0431 02/29/24 1137 03/01/24 0452 03/01/24 0701 03/02/24 0024 03/02/24 0610 03/02/24 1727 03/03/24 0004 03/03/24 0531  WBC 36.5*   < > 32.9*  --  19.3*  --  13.5*  --   --  12.1*  --   --  14.5*  NEUTROABS 33.3*  --  29.6*  --   --   --   --   --   --   --   --   --   --   HGB 12.6   < > 10.2*   < >  9.3*   < > 8.3*   < > 7.9* 8.0* 7.7* 8.3* 8.6*  HCT 39.2   < > 30.6*   < > 27.5*   < > 23.9*   < > 23.4* 23.5* 23.3* 25.4* 25.8*  MCV 96.3   < > 95.0  --  94.2  --  90.9  --   --  95.5  --   --  93.8  PLT 279   < > 268  --  209  --  115*  --   --  126*  --   --  143*   < > = values in this interval not displayed.    Basic Metabolic Panel: Recent Labs  Lab 02/28/24 0608 02/28/24 1026 02/29/24 0431 02/29/24 1652 03/01/24 0452 03/02/24 0610 03/03/24 0531  NA 124*   < > 131* 132* 130* 133* 137  K 3.7   < > 3.7 3.2* 3.6 3.2* 4.6  CL 89*   < > 88* 89* 90* 95* 101  CO2 9*   < > 16* 25 25 28 29   GLUCOSE 228*   < > 200* 147* 148* 235* 101*  BUN 84*   < > 76* 74* 68* 58* 41*  CREATININE 6.17*   < > 4.90* 4.17* 3.82* 2.42*  1.25*  CALCIUM 6.8*   < > 7.6* 7.7* 8.4* 8.5* 8.6*  MG 1.6*  --  1.5*  --  2.0 2.0 1.7  PHOS 7.9*  --  6.3*  --  4.4 3.3 1.3*   < > = values in this interval not displayed.   GFR: Estimated Creatinine Clearance: 36.3 mL/min (A) (by C-G formula based on SCr of 1.25 mg/dL (H)). Recent Labs  Lab 02/28/24 0156 02/28/24 0608 02/28/24 0920 02/29/24 0431 02/29/24 1044 02/29/24 1309 03/01/24 0452 03/01/24 1055 03/01/24 1310 03/02/24 0610 03/03/24 0531  PROCALCITON 41.08 38.89  --  17.07  --   --   --   --   --   --   --   WBC 45.4* 43.1*   < > 19.3*  --   --  13.5*  --   --  12.1* 14.5*  LATICACIDVEN 4.6* 6.4*   < > 2.9* 2.6* 2.7*  --  1.5 1.5  --   --    < > = values in this interval not displayed.    Liver Function Tests: Recent Labs  Lab 02/27/24 1901 02/28/24 0608 02/28/24 1528 02/29/24 0431 03/02/24 0610 03/03/24 0531  AST 40 33 32  --   --   --   ALT 19 20 19   --   --   --   ALKPHOS 139* 109 106  --   --   --   BILITOT 0.9 1.2 0.9  --   --   --   PROT 6.6 5.8* 5.8*  --   --   --   ALBUMIN 3.1* 2.8* 2.9* 3.5 2.7* 2.9*   Recent Labs  Lab 02/27/24 1901  LIPASE 25   No results for input(s): "AMMONIA" in the last 168 hours.  ABG    Component  Value Date/Time   PHART 7.47 (H) 03/01/2024 1055   PCO2ART 40 03/01/2024 1055   PO2ART 112 (H) 03/01/2024 1055   HCO3 29.1 (H) 03/01/2024 1055   ACIDBASEDEF 2.7 (H) 02/29/2024 0952   O2SAT 100 03/02/2024 0813     Coagulation Profile: Recent Labs  Lab 02/28/24 0156 02/28/24 1451 02/29/24 0431 03/02/24 1141  INR 5.0* 3.0* 2.1* 1.4*    Cardiac Enzymes: Recent Labs  Lab 02/28/24 0156  CKTOTAL 53    HbA1C: Hgb A1c MFr Bld  Date/Time Value Ref Range Status  02/28/2024 06:08 AM 5.9 (H) 4.8 - 5.6 % Final    Comment:    (NOTE) Pre diabetes:          5.7%-6.4%  Diabetes:              >6.4%  Glycemic control for   <7.0% adults with diabetes   06/07/2023 02:58 AM 6.3 (H) 4.8 - 5.6 % Final    Comment:    (NOTE)         Prediabetes: 5.7 - 6.4         Diabetes: >6.4         Glycemic control for adults with diabetes: <7.0     CBG: Recent Labs  Lab 03/02/24 1530 03/02/24 1941 03/02/24 2322 03/03/24 0307 03/03/24 0743  GLUCAP 269* 237* 285* 128* 103*    Review of Systems:   N/A  Past Medical History:  She,  has a past medical history of AC (acromioclavicular) joint bone spurs, right, Anemia, Arthritis, Atrial fibrillation (HCC), Cardiac murmur, CHF (congestive heart failure) (HCC), Chronic anticoagulation, Cor triatriatum, Coronary artery disease, Cortical cataract, DOE (dyspnea on exertion), DOE (dyspnea  on exertion), GERD (gastroesophageal reflux disease), HFrEF (heart failure with reduced ejection fraction) (HCC), History of shingles (2004), Hyperlipidemia, Hypertension, Hypothyroidism, IBS (irritable bowel syndrome), Ischemic cardiomyopathy, Lumbar scoliosis, Lumbar spinal stenosis, Migraines, Osteoporosis, Pulmonary HTN (HCC), S/P CABG x 3 (01/28/2004), Sleep apnea, T2DM (type 2 diabetes mellitus) (HCC), TIA (transient ischemic attack) (05/29/2016), and Vitamin B 12 deficiency.   Surgical History:   Past Surgical History:  Procedure Laterality Date   CARDIAC  CATHETERIZATION     CATARACT EXTRACTION W/ INTRAOCULAR LENS IMPLANT Bilateral    Cataract Extraction with IOL   COLECTOMY  02/28/2024   Procedure: COLECTOMY, TOTAL;  Surgeon: Campbell Lerner, MD;  Location: [facility] ORS;  Service: General;;   COLONOSCOPY N/A 12/19/2018   Procedure: COLONOSCOPY;  Surgeon: Pasty Spillers, MD;  Location: [facility] ENDOSCOPY;  Service: Endoscopy;  Laterality: N/A;   COLONOSCOPY WITH PROPOFOL N/A 06/17/2020   Procedure: COLONOSCOPY WITH PROPOFOL;  Surgeon: Pasty Spillers, MD;  Location: [facility] ENDOSCOPY;  Service: Endoscopy;  Laterality: N/A;   COLONOSCOPY WITH PROPOFOL N/A 03/30/2023   Procedure: COLONOSCOPY WITH PROPOFOL;  Surgeon: Jaynie Collins, DO;  Location: [facility] ENDOSCOPY;  Service: Endoscopy;  Laterality: N/A;   COR TRIATRIATUM REPAIR N/A 01/28/2004   CORONARY ARTERY BYPASS GRAFT N/A 01/28/2004   3v CABG   ESOPHAGOGASTRODUODENOSCOPY N/A 12/19/2018   Procedure: ESOPHAGOGASTRODUODENOSCOPY (EGD);  Surgeon: Pasty Spillers, MD;  Location: [facility] ENDOSCOPY;  Service: Endoscopy;  Laterality: N/A;   ESOPHAGOGASTRODUODENOSCOPY (EGD) WITH PROPOFOL N/A 06/17/2020   Procedure: ESOPHAGOGASTRODUODENOSCOPY (EGD) WITH PROPOFOL;  Surgeon: Pasty Spillers, MD;  Location: [facility] ENDOSCOPY;  Service: Endoscopy;  Laterality: N/A;   ESOPHAGOGASTRODUODENOSCOPY (EGD) WITH PROPOFOL N/A 03/30/2023   Procedure: ESOPHAGOGASTRODUODENOSCOPY (EGD) WITH PROPOFOL;  Surgeon: Jaynie Collins, DO;  Location: [facility] ENDOSCOPY;  Service: Endoscopy;  Laterality: N/A;   LAPAROTOMY N/A 02/28/2024   Procedure: LAPAROTOMY, EXPLORATORY;  Surgeon: Campbell Lerner, MD;  Location: [facility] ORS;  Service: General;  Laterality: N/A;   RIGHT/LEFT HEART CATH AND CORONARY ANGIOGRAPHY Bilateral 03/29/2017   Procedure: Right/Left Heart Cath and Coronary Angiography;  Surgeon: Dalia Heading, MD;  Location: [facility] INVASIVE CV LAB;  Service: Cardiovascular;  Laterality: Bilateral;   TOTAL KNEE  ARTHROPLASTY Right 04/05/2022   Procedure: TOTAL KNEE ARTHROPLASTY;  Surgeon: Kennedy Bucker, MD;  Location: [facility] ORS;  Service: Orthopedics;  Laterality: Right;   TUBAL LIGATION     VENTRAL HERNIA REPAIR N/A 04/21/2017   Procedure: HERNIA REPAIR VENTRAL ADULT;  Surgeon: Nadeen Landau, MD;  Location: [facility] ORS;  Service: General;  Laterality: N/A;     Social History:   reports that she quit smoking about 24 years ago. Her smoking use included cigarettes. She has never used smokeless tobacco. She reports that she does not drink alcohol and does not use drugs.   Family History:  Her family history includes Breast cancer in her cousin; Cancer in her sister and sister; Diabetes in her father; Valvular heart disease in her mother.   Allergies Allergies  Allergen Reactions   Vioxx [Rofecoxib] Swelling     Home Medications  Prior to Admission medications   Medication Sig Start Date End Date Taking? Authorizing Provider  albuterol (PROVENTIL HFA;VENTOLIN HFA) 108 (90 Base) MCG/ACT inhaler Inhale 2 puffs into the lungs every 6 (six) hours as needed for wheezing or shortness of breath.   Yes [provider]  allopurinol (ZYLOPRIM) 300 MG tablet Take 300 mg by mouth daily.   Yes [provider]  colchicine 0.6 MG tablet Take 0.6-1.8 mg  by mouth as needed (take 1.2 mg (2 tablets) at onset of gout flare, Take one additinaltablet one hour later if symptoms persist. Maximum of 3 tablets per day).   Yes [provider]  ELIQUIS 5 MG TABS tablet Take 5 mg by mouth 2 (two) times daily.   Yes [provider]  JARDIANCE 10 MG TABS tablet Take 10 mg by mouth daily.   Yes [provider]  latanoprost (XALATAN) 0.005 % ophthalmic solution Place 1 drop into both eyes at bedtime.   Yes [provider]  levothyroxine (SYNTHROID) 50 MCG tablet Take 50 mcg by mouth daily before breakfast.   Yes [provider]  metFORMIN (GLUCOPHAGE) 1000 MG tablet  Take 1,000 mg by mouth 2 (two) times daily with a meal.   Yes [provider]  pantoprazole (PROTONIX) 40 MG tablet Take 40 mg by mouth daily. 01/28/21  Yes [provider]  rosuvastatin (CRESTOR) 5 MG tablet Take 5 mg by mouth daily. 02/20/24 02/19/25 Yes [provider]  spironolactone (ALDACTONE) 25 MG tablet Take 25 mg by mouth daily.   Yes [provider]  sucralfate (CARAFATE) 1 g tablet Take 1 g by mouth 4 (four) times daily -  with meals and at bedtime.   Yes [provider]  torsemide (DEMADEX) 20 MG tablet Take 20 mg by mouth 2 (two) times daily. 02/20/24 02/19/25 Yes [provider]  valsartan (DIOVAN) 40 MG tablet Take 20 mg by mouth 2 (two) times daily. 02/20/24 02/19/25 Yes [provider]  vitamin B-12 (CYANOCOBALAMIN) 1000 MCG tablet Take 1,000 mcg by mouth daily.   Yes [provider]     Critical care time: 34 minutes    Raechel Chute, MD [facility] 03/03/2024 12:51 PM

## 2024-03-03 NOTE — Progress Notes (Signed)
 [facility] Cardiology  SUBJECTIVE: Patient laying in bed, reports feeling better, increase p.o. intake with fullliquids, denies chest pain or shortness of breath   Vitals:   03/03/24 0631 03/03/24 0636 03/03/24 0645 03/03/24 0700  BP:      Pulse: 100 (!) 103 96 91  Resp: 10 11 13 12   Temp:      TempSrc:      SpO2: 97% 94% 97% 98%  Weight:      Height:         Intake/Output Summary (Last 24 hours) at 03/03/2024 0843 Last data filed at 03/03/2024 0700 Gross per 24 hour  Intake 2917.72 ml  Output 2325 ml  Net 592.72 ml      PHYSICAL EXAM  General: Well developed, well nourished, in no acute distress HEENT:  Normocephalic and atramatic Neck:  No JVD.  Lungs: Clear bilaterally to auscultation and percussion. Heart: HRRR . Normal S1 and S2 without gallops or murmurs.  Abdomen: Bowel sounds are positive, abdomen soft and non-tender  Msk:  Back normal, normal gait. Normal strength and tone for age. Extremities: No clubbing, cyanosis or edema.   Neuro: Alert and oriented X 3. Psych:  Good affect, responds appropriately   LABS: Basic Metabolic Panel: Recent Labs    03/02/24 0610 03/03/24 0531  NA 133* 137  K 3.2* 4.6  CL 95* 101  CO2 28 29  GLUCOSE 235* 101*  BUN 58* 41*  CREATININE 2.42* 1.25*  CALCIUM 8.5* 8.6*  MG 2.0 1.7  PHOS 3.3 1.3*   Liver Function Tests: Recent Labs    03/02/24 0610 03/03/24 0531  ALBUMIN 2.7* 2.9*   No results for input(s): "LIPASE", "AMYLASE" in the last 72 hours. CBC: Recent Labs    03/02/24 0610 03/02/24 1727 03/03/24 0004 03/03/24 0531  WBC 12.1*  --   --  14.5*  HGB 8.0*   < > 8.3* 8.6*  HCT 23.5*   < > 25.4* 25.8*  MCV 95.5  --   --  93.8  PLT 126*  --   --  143*   < > = values in this interval not displayed.   Cardiac Enzymes: No results for input(s): "CKTOTAL", "CKMB", "CKMBINDEX", "TROPONINI" in the last 72 hours. BNP: Invalid input(s): "POCBNP" D-Dimer: No results for input(s): "DDIMER" in the last 72  hours. Hemoglobin A1C: No results for input(s): "HGBA1C" in the last 72 hours. Fasting Lipid Panel: Recent Labs    03/02/24 0611  TRIG 136   Thyroid Function Tests: No results for input(s): "TSH", "T4TOTAL", "T3FREE", "THYROIDAB" in the last 72 hours.  Invalid input(s): "FREET3" Anemia Panel: No results for input(s): "VITAMINB12", "FOLATE", "FERRITIN", "TIBC", "IRON", "RETICCTPCT" in the last 72 hours.  No results found.   Echo LVEF 35% 01/28/2024  TELEMETRY: Atrial fibrillation:  ASSESSMENT AND PLAN:  Principal Problem:   Severe sepsis with septic shock (CODE) (HCC) Active Problems:   Pancolitis (HCC)   Clostridium difficile colitis   Sepsis with acute renal failure and septic shock (HCC)    1.  Ischemic cardiomyopathy, LVEF 35%, good medical management being held at this time, status post colectomy 2.  Coronary artery disease, status post CABG, no chest pain, minimal troponin elevation (78, 89) 3.  Persistent atrial fibrillation, status post Watchman device 12/04/2023, Eliquis held due to anemia, hemoglobin hematocrit 8.6 and 25.8, respectively 4.  Elevated troponin (78, 89), likely demand supply ischemia 5.  Severe C. difficile colitis, status post total colectomy and end ileostomy 02/28/2024, now on full liquids  6.  Acute kidney injury on CKD stage III, likely secondary to severe ATN due to septic shock, slowly improving BUN and creatinine 41 and 1.25, respectively   Recommendations   1.  Agree with current therapy 2.  Resume good medical management (valsartan, spironolactone, torsemide, empagliflozin) as p.o. intake improves and renal function stabilizes 3.  Hold Eliquis for now, monitor hemoglobin 4.  IV diuretics as needed 5.  No further cardiac diagnostics at this time     Marcina Millard, MD, PhD, [facility] 03/03/2024 8:43 AM

## 2024-03-04 DIAGNOSIS — E875 Hyperkalemia: Secondary | ICD-10-CM

## 2024-03-04 DIAGNOSIS — R6521 Severe sepsis with septic shock: Secondary | ICD-10-CM | POA: Diagnosis not present

## 2024-03-04 LAB — BASIC METABOLIC PANEL WITH GFR
Anion gap: 8 (ref 5–15)
BUN: 35 mg/dL — ABNORMAL HIGH (ref 8–23)
CO2: 23 mmol/L (ref 22–32)
Calcium: 7.9 mg/dL — ABNORMAL LOW (ref 8.9–10.3)
Chloride: 102 mmol/L (ref 98–111)
Creatinine, Ser: 0.93 mg/dL (ref 0.44–1.00)
GFR, Estimated: >60 mL/min (ref >60)
Glucose, Bld: 304 mg/dL — ABNORMAL HIGH (ref 70–99)
Potassium: 4.5 mmol/L (ref 3.5–5.1)
Sodium: 133 mmol/L — ABNORMAL LOW (ref 135–145)

## 2024-03-04 LAB — RENAL FUNCTION PANEL
Albumin: 2.6 g/dL — ABNORMAL LOW (ref 3.5–5.0)
Anion gap: 7 (ref 5–15)
BUN: 36 mg/dL — ABNORMAL HIGH (ref 8–23)
CO2: 26 mmol/L (ref 22–32)
Calcium: 8.4 mg/dL — ABNORMAL LOW (ref 8.9–10.3)
Chloride: 102 mmol/L (ref 98–111)
Creatinine, Ser: 0.98 mg/dL (ref 0.44–1.00)
GFR, Estimated: >60 mL/min (ref >60)
Glucose, Bld: 132 mg/dL — ABNORMAL HIGH (ref 70–99)
Phosphorus: <1 mg/dL — CL (ref 2.5–4.6)
Potassium: 5.3 mmol/L — ABNORMAL HIGH (ref 3.5–5.1)
Sodium: 135 mmol/L (ref 135–145)

## 2024-03-04 LAB — CULTURE, BLOOD (ROUTINE X 2)
Culture: NO GROWTH
Culture: NO GROWTH

## 2024-03-04 LAB — GLUCOSE, CAPILLARY
Glucose-Capillary: 115 mg/dL — ABNORMAL HIGH (ref 70–99)
Glucose-Capillary: 134 mg/dL — ABNORMAL HIGH (ref 70–99)
Glucose-Capillary: 172 mg/dL — ABNORMAL HIGH (ref 70–99)
Glucose-Capillary: 207 mg/dL — ABNORMAL HIGH (ref 70–99)
Glucose-Capillary: 268 mg/dL — ABNORMAL HIGH (ref 70–99)
Glucose-Capillary: 320 mg/dL — ABNORMAL HIGH (ref 70–99)

## 2024-03-04 LAB — COMPREHENSIVE METABOLIC PANEL WITH GFR
ALT: 23 U/L (ref 0–44)
AST: 40 U/L (ref 15–41)
Albumin: 2.6 g/dL — ABNORMAL LOW (ref 3.5–5.0)
Alkaline Phosphatase: 210 U/L — ABNORMAL HIGH (ref 38–126)
Anion gap: 6 (ref 5–15)
BUN: 35 mg/dL — ABNORMAL HIGH (ref 8–23)
CO2: 26 mmol/L (ref 22–32)
Calcium: 8.4 mg/dL — ABNORMAL LOW (ref 8.9–10.3)
Chloride: 103 mmol/L (ref 98–111)
Creatinine, Ser: 1.03 mg/dL — ABNORMAL HIGH (ref 0.44–1.00)
GFR, Estimated: 59 mL/min — ABNORMAL LOW (ref >60)
Glucose, Bld: 131 mg/dL — ABNORMAL HIGH (ref 70–99)
Potassium: 5.3 mmol/L — ABNORMAL HIGH (ref 3.5–5.1)
Sodium: 135 mmol/L (ref 135–145)
Total Bilirubin: 1 mg/dL (ref 0.0–1.2)
Total Protein: 5.1 g/dL — ABNORMAL LOW (ref 6.5–8.1)

## 2024-03-04 LAB — CBC
HCT: 24.5 % — ABNORMAL LOW (ref 36.0–46.0)
Hemoglobin: 7.9 g/dL — ABNORMAL LOW (ref 12.0–15.0)
MCH: 31.3 pg (ref 26.0–34.0)
MCHC: 32.2 g/dL (ref 30.0–36.0)
MCV: 97.2 fL (ref 80.0–100.0)
Platelets: 104 10*3/uL — ABNORMAL LOW (ref 150–400)
RBC: 2.52 MIL/uL — ABNORMAL LOW (ref 3.87–5.11)
RDW: 17.6 % — ABNORMAL HIGH (ref 11.5–15.5)
WBC: 12.6 10*3/uL — ABNORMAL HIGH (ref 4.0–10.5)
nRBC: 1.9 % — ABNORMAL HIGH (ref 0.0–0.2)

## 2024-03-04 LAB — PHOSPHORUS: Phosphorus: 1.9 mg/dL — ABNORMAL LOW (ref 2.5–4.6)

## 2024-03-04 LAB — VITAMIN D 25 HYDROXY (VIT D DEFICIENCY, FRACTURES): Vit D, 25-Hydroxy: 36.2 ng/mL (ref 30–100)

## 2024-03-04 LAB — MAGNESIUM: Magnesium: 2.2 mg/dL (ref 1.7–2.4)

## 2024-03-04 LAB — TRIGLYCERIDES: Triglycerides: 31 mg/dL (ref <150)

## 2024-03-04 LAB — HEPARIN LEVEL (UNFRACTIONATED): Heparin Unfractionated: 0.27 [IU]/mL — ABNORMAL LOW (ref 0.30–0.70)

## 2024-03-04 MED ORDER — SODIUM CHLORIDE 0.9 % IV SOLN
15.0000 mmol | Freq: Once | INTRAVENOUS | Status: AC
  Administered 2024-03-04: 15 mmol via INTRAVENOUS
  Filled 2024-03-04: qty 5

## 2024-03-04 MED ORDER — MORPHINE SULFATE (PF) 2 MG/ML IV SOLN: 2.0000 mg | INTRAVENOUS | Status: DC | PRN

## 2024-03-04 MED ORDER — INSULIN GLARGINE-YFGN 100 UNIT/ML ~~LOC~~ SOPN
20.0000 [IU] | PEN_INJECTOR | Freq: Every day | SUBCUTANEOUS | Status: DC
  Filled 2024-03-04: qty 3

## 2024-03-04 MED ORDER — SODIUM ZIRCONIUM CYCLOSILICATE 5 G PO PACK
10.0000 g | PACK | Freq: Once | ORAL | Status: AC
  Administered 2024-03-04: 10 g via ORAL
  Filled 2024-03-04: qty 2

## 2024-03-04 MED ORDER — ADULT MULTIVITAMIN W/MINERALS CH
1.0000 | ORAL_TABLET | Freq: Every day | ORAL | Status: DC
  Administered 2024-03-05 – 2024-03-12 (×8): 1 via ORAL
  Filled 2024-03-04 (×8): qty 1

## 2024-03-04 MED ORDER — ACETAMINOPHEN 325 MG PO TABS
650.0000 mg | ORAL_TABLET | Freq: Four times a day (QID) | ORAL | Status: DC | PRN
  Administered 2024-03-06 – 2024-03-12 (×8): 650 mg via ORAL
  Filled 2024-03-04 (×8): qty 2

## 2024-03-04 MED ORDER — POTASSIUM PHOSPHATES 15 MMOLE/5ML IV SOLN
15.0000 mmol | Freq: Once | INTRAVENOUS | Status: DC
  Filled 2024-03-04: qty 5

## 2024-03-04 MED ORDER — HEPARIN BOLUS VIA INFUSION
900.0000 [IU] | Freq: Once | INTRAVENOUS | Status: DC
  Filled 2024-03-04: qty 900

## 2024-03-04 MED ORDER — INSULIN GLARGINE-YFGN 100 UNIT/ML ~~LOC~~ SOLN
20.0000 [IU] | Freq: Every day | SUBCUTANEOUS | Status: DC
  Administered 2024-03-04 – 2024-03-09 (×6): 20 [IU] via SUBCUTANEOUS
  Filled 2024-03-04 (×7): qty 0.2

## 2024-03-04 MED ORDER — SODIUM PHOSPHATES 45 MMOLE/15ML IV SOLN
30.0000 mmol | Freq: Once | INTRAVENOUS | Status: AC
  Administered 2024-03-04: 30 mmol via INTRAVENOUS
  Filled 2024-03-04: qty 10

## 2024-03-04 MED ORDER — POTASSIUM PHOSPHATES 15 MMOLE/5ML IV SOLN: 30.0000 mmol | Freq: Once | INTRAVENOUS | Status: DC

## 2024-03-04 MED ORDER — APIXABAN 5 MG PO TABS
5.0000 mg | ORAL_TABLET | Freq: Two times a day (BID) | ORAL | Status: DC
  Administered 2024-03-04 – 2024-03-12 (×17): 5 mg via ORAL
  Filled 2024-03-04 (×17): qty 1

## 2024-03-04 MED ORDER — THIAMINE MONONITRATE 100 MG PO TABS
100.0000 mg | ORAL_TABLET | Freq: Every day | ORAL | Status: DC
  Administered 2024-03-04 – 2024-03-12 (×9): 100 mg via ORAL
  Filled 2024-03-04 (×9): qty 1

## 2024-03-04 MED ORDER — OXYCODONE HCL 5 MG PO TABS
5.0000 mg | ORAL_TABLET | Freq: Four times a day (QID) | ORAL | Status: DC | PRN
  Administered 2024-03-04 – 2024-03-11 (×10): 5 mg via ORAL
  Filled 2024-03-04 (×10): qty 1

## 2024-03-04 MED ORDER — FUROSEMIDE 10 MG/ML IJ SOLN
40.0000 mg | Freq: Once | INTRAMUSCULAR | Status: AC
  Administered 2024-03-04: 40 mg via INTRAVENOUS
  Filled 2024-03-04: qty 4

## 2024-03-04 MED ORDER — GLUCERNA SHAKE PO LIQD
237.0000 mL | Freq: Three times a day (TID) | ORAL | Status: DC
Start: 2024-03-04 — End: 2024-03-12
  Administered 2024-03-04 – 2024-03-12 (×22): 237 mL via ORAL

## 2024-03-04 NOTE — Evaluation (Signed)
 Occupational Therapy Evaluation Patient Details Name: Tammy Arias MRN: [id number] DOB: 03/03/1956 Today's Date: 03/04/2024   History of Present Illness   Patient is a 67 year old female  s/p exploratory laparotomy, total colectomy, and end ileostomy creation for septic shock secondary to C diff colitis. Vasopressor support required. History of CAD s/p CABG, heart failure     Clinical Impressions Patient presenting with decreased Ind in self care,balance, functional mobility/transfers, endurance, and safety awareness. Patient reports being Mod I and living at home alone PTA. She plans on discharging home with a friend who is going to assist her as needed.  Patient currently functioning at min A overall with use of RW. She fatigues quickly during session but is motivated to participate. Pt ambulates to sink with RW for oral hygiene and returns back to bed at end of session with min A.  Patient will benefit from acute OT to increase overall independence in the areas of ADLs, functional mobility, and safety awareness in order to safely discharge.     If plan is discharge home, recommend the following:   A little help with walking and/or transfers;A little help with bathing/dressing/bathroom;Assistance with cooking/housework;Assist for transportation;Help with stairs or ramp for entrance     Functional Status Assessment   Patient has had a recent decline in their functional status and demonstrates the ability to make significant improvements in function in a reasonable and predictable amount of time.     Equipment Recommendations   BSC/3in1      Precautions/Restrictions   Precautions Precautions: Fall Recall of Precautions/Restrictions: Intact Precaution/Restrictions Comments: ostomy     Mobility Bed Mobility Overal bed mobility: Needs Assistance Bed Mobility: Supine to Sit, Sit to Supine     Supine to sit: Min assist Sit to supine: Min assist         Transfers Overall transfer level: Needs assistance Equipment used: Rolling walker (2 wheels) Transfers: Sit to/from Stand Sit to Stand: Min assist                  Balance Overall balance assessment: Needs assistance Sitting-balance support: Feet supported Sitting balance-Leahy Scale: Fair     Standing balance support: Bilateral upper extremity supported, Reliant on assistive device for balance, During functional activity Standing balance-Leahy Scale: Poor Standing balance comment: relying external support of rolling walker                           ADL either performed or assessed with clinical judgement   ADL Overall ADL's : Needs assistance/impaired                         Toilet Transfer: Minimal assistance   Toileting- Clothing Manipulation and Hygiene: Minimal assistance Toileting - Clothing Manipulation Details (indicate cue type and reason): simuated             Vision Patient Visual Report: No change from baseline              Pertinent Vitals/Pain Pain Assessment Pain Assessment: Faces Faces Pain Scale: Hurts a little bit Pain Location: abdomen Pain Descriptors / Indicators: Sore, Discomfort Pain Intervention(s): Limited activity within patient's tolerance, Monitored during session, Repositioned     Extremity/Trunk Assessment Upper Extremity Assessment Upper Extremity Assessment: Overall WFL for tasks assessed           Communication Communication Communication: No apparent difficulties   Cognition Arousal: Alert Behavior During Therapy: [facility]  for tasks assessed/performed                                 Following commands: Intact       Cueing  General Comments   Cueing Techniques: Verbal cues              Home Living Family/patient expects to be discharged to:: Private residence Living Arrangements: Alone Available Help at Discharge: Friend(s);Available 24 hours/day Type of Home:  House Home Access: Stairs to enter [employer] of Steps: 4 Entrance Stairs-Rails: Right;Left Home Layout: One level     Bathroom Shower/Tub: Chief Strategy Officer: Standard     Home Equipment: Agricultural consultant (2 wheels);Cane - single point   Additional Comments: the above information is at her friend's home where patient will be going at discharge. The patient's home has no steps to etner and a walk in shower      Prior Functioning/Environment Prior Level of Function : Independent/Modified Independent             Mobility Comments: intermittently using cane for ambulation ADLs Comments: Mod I for ADLs an IADLs    OT Problem List: Decreased strength;Decreased activity tolerance;Decreased safety awareness;Impaired balance (sitting and/or standing);Decreased knowledge of use of DME or AE   OT Treatment/Interventions: Self-care/ADL training;Therapeutic exercise;Therapeutic activities;Energy conservation;DME and/or AE instruction;Patient/family education;Balance training      OT Goals(Current goals can be found in the care plan section)   Acute Rehab OT Goals Patient Stated Goal: to get stronger OT Goal Formulation: With patient Time For Goal Achievement: 03/18/24 Potential to Achieve Goals: Fair ADL Goals Pt Will Perform Grooming: with modified independence;standing Pt Will Perform Lower Body Dressing: with supervision;sit to/from stand Pt Will Transfer to Toilet: with supervision Pt Will Perform Toileting - Clothing Manipulation and hygiene: with supervision   OT Frequency:  Min 1X/week       AM-PAC OT "6 Clicks" Daily Activity     Outcome Measure Help from another person eating meals?: None Help from another person taking care of personal grooming?: None Help from another person toileting, which includes using toliet, bedpan, or urinal?: A Little Help from another person bathing (including washing, rinsing, drying)?: A Little Help from  another person to put on and taking off regular upper body clothing?: A Little Help from another person to put on and taking off regular lower body clothing?: A Little 6 Click Score: 20   End of Session    Activity Tolerance: Patient tolerated treatment well Patient left: in bed;with call bell/phone within reach;with bed alarm set  OT Visit Diagnosis: Unsteadiness on feet (R26.81);Repeated falls (R29.6);Muscle weakness (generalized) (M62.81)                Time: 1610-9604 OT Time Calculation (min): 18 min Charges:  OT General Charges $OT Visit: 1 Visit OT Evaluation $OT Eval Moderate Complexity: 1 Mod OT Treatments $Self Care/Home Management : 8-22 mins Jackquline Denmark, MS, OTR/L , CBIS ascom [phone]  03/04/24, 1:41 PM

## 2024-03-04 NOTE — Consult Note (Addendum)
 PHARMACY - ANTICOAGULATION CONSULT NOTE  Pharmacy Consult for heparin Indication: atrial fibrillation  Allergies  Allergen Reactions   Vioxx [Rofecoxib] Swelling    Patient Measurements: Height: 5\' 1"  (154.9 cm) Weight:  (unable to get d/t bed malfunction) IBW/kg (Calculated) : 47.8 HEPARIN DW (KG): 60.2  Vital Signs: Temp: 97.7 F (36.5 C) (04/07 0400) Temp Source: Oral (04/07 0400) BP: 94/55 (04/07 0400) Pulse Rate: 88 (04/07 0400)  Labs: Recent Labs     0000 03/02/24 0610 03/02/24 1141 03/02/24 1727 03/02/24 2212 03/03/24 0004 03/03/24 0531 03/04/24 0345 03/04/24 0346  HGB  --  8.0*  --    < >  --  8.3* 8.6* 7.9*  --   HCT  --  23.5*  --    < >  --  25.4* 25.8* 24.5*  --   PLT  --  126*  --   --   --   --  143* 104*  --   APTT  --   --  40*  --   --   --   --   --   --   LABPROT  --   --  17.4*  --   --   --   --   --   --   INR  --   --  1.4*  --   --   --   --   --   --   HEPARINUNFRC   < >  --  0.50  --  0.47  --  0.41 0.27*  --   CREATININE  --  2.42*  --   --   --   --  1.25* 1.03* 0.98   < > = values in this interval not displayed.    Estimated Creatinine Clearance: 46.4 mL/min (by C-G formula based on SCr of 0.98 mg/dL).   Medical History: Past Medical History:  Diagnosis Date   AC (acromioclavicular) joint bone spurs, right    Anemia    Arthritis    Atrial fibrillation (HCC)    a.) CHA2DS2-VASc = 7 (age, sex, HFrEF, HTN, TIA x 2, T2DM). b.) rate/rhythm controlled on oral carvedilol; chronically anticoagulated with warfarin.   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 b.) R/LHC 03/29/2017: small RCA with occluded SVG; no significant Dz. Chronically occluded LAD with patent SVG to D1/LAD. Insignificant Dz in LCx. LM normal.   Cortical cataract    DOE (dyspnea on exertion)    DOE (dyspnea on  exertion)    GERD (gastroesophageal reflux disease)    HFrEF (heart failure with reduced ejection fraction) (HCC)    a.) TTE 10/27/2014: EF 40%; glob HK; mild BAE; triv AR, mild MR/PR, mod TR.  b.) TTE 03/15/2017: EF 30%; glob HK; mod BAE; mod pHTN (RVSP 54.8 mmHg); mild PR, mod MR; sev TR.  c.) R/LHC 03/29/2017: EF 30-35%. d.)TTE 10/25/2018: EF 35%; mod BAE; mod BVE; glob HK; triv PR, mod MR, sev TR; RVSP 57.7 mmHg; G2DD.   History of shingles 2004   Hyperlipidemia    Hypertension    Hypothyroidism    IBS (irritable bowel syndrome)    Ischemic cardiomyopathy    a.) TTE 10/27/2014: EF 40%. b.) TTE 03/15/2017: EF 30%. c.) R/LHC 04/01/2017: EF 30-35%. d.) TTE 10/25/2018: EF 35%  Lumbar scoliosis    Lumbar spinal stenosis    Migraines    Osteoporosis    Pulmonary HTN (HCC)    a.) TTE 03/15/2017: EF 30-35%; RVSP 54.8 mmHg. 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 Last dose PTA was 4/1. Currently being held due to anemia and low Hgb.  Assessment: 68 y/o female with PMH of HLD, HTN, T2DM, HFrEF, ischemic cardiomyopathy, PAF s/p watchman device, TIA, CAD s/p CABG x 3, PAF, iron deficiency anemia, cirrhosis, CKD III, recent C. Diff infection. CHA2DS2-VASc 7. Patient is admitted for recurrent severe sepsis with shock secondary to C.Diff infection, AKI, and pancolitis. Hgb trending down s/p exploratory laparotomy on 4/2 (10.3> 8.1> 7.6> 8.0). Platelets trending down 279 > 209 > 115 > 126.   Goal of Therapy:  Heparin level 0.3-0.5 units/ml (NO BOLUSES) Monitor platelets by anticoagulation protocol: Yes  4/05 2212 HL 0.47, therapeutic x 2 4/06 0531 HL 0.41, therapeutic x 3 4/07 0345 HL 0.27, subtherapeutic   Plan:   -Increase heparin infusion to 800 units/hr -Recheck HL in 6 hrs after rate change -CBC daily while on heparin - Monitor for s/sx of bleeding   Otelia Sergeant, PharmD, [facility] 03/04/2024 5:03 AM

## 2024-03-04 NOTE — Plan of Care (Signed)
  Problem: Education: Goal: Knowledge of General Education information will improve Description: Including pain rating scale, medication(s)/side effects and non-pharmacologic comfort measures Outcome: Progressing   Problem: Clinical Measurements: Goal: Ability to maintain clinical measurements within normal limits will improve Outcome: Progressing Goal: Diagnostic test results will improve Outcome: Progressing Goal: Respiratory complications will improve Outcome: Progressing Goal: Cardiovascular complication will be avoided Outcome: Progressing   Problem: Activity: Goal: Risk for activity intolerance will decrease Outcome: Progressing   Problem: Nutrition: Goal: Adequate nutrition will be maintained Outcome: Progressing   

## 2024-03-04 NOTE — Evaluation (Signed)
 Physical Therapy Evaluation Patient Details Name: Tammy Arias MRN: [id number] DOB: 14-Nov-1956 Today's Date: 03/04/2024  History of Present Illness  Patient is a 68 year old female  s/p exploratory laparotomy, total colectomy, and end ileostomy creation for septic shock secondary to C diff colitis. Vasopressor support required. History of CAD s/p CABG, heart failure  Clinical Impression  Patient is agreeable to PT evaluation. She uses a cane at baseline for ambulation. She lives alone but plans to stay at a friend's house for caregiver support with 4 steps to enter.  Today the patient required Min A for bed mobility. Min A- CGA for standing from various surfaces including bed and bed side commode with cues for hand placement. She walked a short distance with rolling walker with no dizziness reported. Activity tolerance limited by fatigue. Vitals monitored throughout session with MAP of 75 with mobility. Recommend to continue PT to maximize independence and facilitate return to prior level of function.       If plan is discharge home, recommend the following: A little help with walking and/or transfers;A little help with bathing/dressing/bathroom;Assist for transportation;Help with stairs or ramp for entrance   Can travel by private vehicle        Equipment Recommendations None recommended by PT  Recommendations for Other Services       Functional Status Assessment Patient has had a recent decline in their functional status and demonstrates the ability to make significant improvements in function in a reasonable and predictable amount of time.     Precautions / Restrictions Precautions Precautions: Fall Recall of Precautions/Restrictions: Intact Precaution/Restrictions Comments: ostomy Restrictions Weight Bearing Restrictions Per Provider Order: No      Mobility  Bed Mobility Overal bed mobility: Needs Assistance Bed Mobility: Supine to Sit     Supine to sit: Min assist, HOB  elevated     General bed mobility comments: assistance mostly for trunk support.    Transfers Overall transfer level: Needs assistance Equipment used: Rolling walker (2 wheels) Transfers: Sit to/from Stand Sit to Stand: Contact guard assist, Min assist           General transfer comment: standing performed from bed and from bed side commode. cues for hand placement for safety with carry over demonstrated    Ambulation/Gait Ambulation/Gait assistance: Contact guard assist Gait Distance (Feet): 12 Feet Assistive device: Rolling walker (2 wheels) Gait Pattern/deviations: Step-through pattern, Decreased stride length Gait velocity: decreased     General Gait Details: activity tolerance limited by fatigue. no dizziness with activity. MAP 75 with upright activity.  Stairs            Wheelchair Mobility     Tilt Bed    Modified Rankin (Stroke Patients Only)       Balance Overall balance assessment: Needs assistance Sitting-balance support: Feet supported Sitting balance-Leahy Scale: Fair     Standing balance support: Bilateral upper extremity supported Standing balance-Leahy Scale: Poor Standing balance comment: relying external support of rolling walker                             Pertinent Vitals/Pain Pain Assessment Pain Assessment: 0-10 Pain Score: 1  Pain Location: abdomen Pain Descriptors / Indicators: Sore Pain Intervention(s): Limited activity within patient's tolerance, Monitored during session, Repositioned    Home Living Family/patient expects to be discharged to:: Private residence Living Arrangements: Alone Available Help at Discharge: Friend(s);Available 24 hours/day Type of Home: House Home Access: Stairs  to enter Entrance Stairs-Rails: Right;Left (unable to reach both) Entrance Stairs-Number of Steps: 4   Home Layout: One level Home Equipment: Agricultural consultant (2 wheels);Cane - single point Additional Comments: the above  information is at her friend's home where patient will be going at discharge. The patient's home has no steps to etner and a walk in shower    Prior Function Prior Level of Function : Independent/Modified Independent             Mobility Comments: intermittently using cane for ambulation       Extremity/Trunk Assessment   Upper Extremity Assessment Upper Extremity Assessment: Overall WFL for tasks assessed    Lower Extremity Assessment Lower Extremity Assessment:  (endurance impaired for sustained activity in standing. no focal weakness is noted)       Communication   Communication Communication: No apparent difficulties    Cognition Arousal: Alert Behavior During Therapy: WFL for tasks assessed/performed   PT - Cognitive impairments: No apparent impairments                         Following commands: Intact       Cueing Cueing Techniques: Verbal cues     General Comments      Exercises     Assessment/Plan    PT Assessment Patient needs continued PT services  PT Problem List Decreased strength;Decreased range of motion;Decreased activity tolerance;Decreased balance;Decreased mobility       PT Treatment Interventions DME instruction;Gait training;Stair training;Functional mobility training;Therapeutic activities;Therapeutic exercise;Balance training;Patient/family education    PT Goals (Current goals can be found in the Care Plan section)  Acute Rehab PT Goals Patient Stated Goal: to go home PT Goal Formulation: With patient Time For Goal Achievement: 03/18/24 Potential to Achieve Goals: Good    Frequency Min 3X/week     Co-evaluation               AM-PAC PT "6 Clicks" Mobility  Outcome Measure Help needed turning from your back to your side while in a flat bed without using bedrails?: A Little Help needed moving from lying on your back to sitting on the side of a flat bed without using bedrails?: A Little Help needed moving to  and from a bed to a chair (including a wheelchair)?: A Little Help needed standing up from a chair using your arms (e.g., wheelchair or bedside chair)?: A Little Help needed to walk in hospital room?: A Little Help needed climbing 3-5 steps with a railing? : A Little 6 Click Score: 18    End of Session   Activity Tolerance: Patient tolerated treatment well Patient left: in chair;with call bell/phone within reach Nurse Communication: Mobility status PT Visit Diagnosis: Muscle weakness (generalized) (M62.81);Unsteadiness on feet (R26.81)    Time: 8119-1478 PT Time Calculation (min) (ACUTE ONLY): 29 min   Charges:   PT Evaluation $PT Eval High Complexity: 1 High PT Treatments $Therapeutic Activity: 8-22 mins PT General Charges $$ ACUTE PT VISIT: 1 Visit        Donna Bernard, PT, MPT   [employer] [employer] 03/04/2024, 8:53 AM

## 2024-03-04 NOTE — Progress Notes (Signed)
 [facility] Progress Note  Patient: Tammy Arias    ZOX:[id number]  DOA: 02/27/2024     Date of Service: the patient was seen and examined on 03/04/2024  Chief Complaint  Patient presents with   Diarrhea   Brief hospital course: 68 yo F presenting to [facility] ED from home for evaluation of diarrhea and vomiting since 02/25/24.   History obtained per chart review and patient bedside report. Patient has had multiple hospitalizations since January 2025 with GI bleeding s/p clipping, watchman device placement and most recently acute CHF exacerbation from 01/26/24- 02/13/24.  During her most recent admission, she required loop diuretics as well as inotrope assistance for adequate diuresis. She was changed from entresto to valsartan due to hypotension and her BB was stopped. She was continued on Eliquis post watchman device per EP recommendations after 6 week TEE on 01/23/24 noted a small flow jet from the LAA back into the LA proper at the edge of the inferior aspect of the occluder device.  She also tested positive for C.Diff completing a 10 day course of PO vancomycin for treatment on 02/10/24.   After discharge she was in her normal state of health until noticing recurrent diarrhea on 02/17/24, which she thought might just be an IBS flare. Diarrhea however worsened on 02/21/24 which got much worse on 3/28 with the addition of persistent non-bloody bilious emesis. Her best friend, who has been helping care for her stated she noticed some blood in her stool that seemed to be from the patient's hemorrhoids. However this bleeding significantly increased on 02/27/24 and the patient has had at least 2 bloody bowel movements. The patient also endorses generalized weakness and fatigue, diffuse abdominal tenderness, blurred vision with green spots. She denies chest pain, dyspnea, urinary symptoms, fever/chills, LOC or palpitations. She did fall about 3 days ago, landing on her buttocks, she denies hitting her head.    She reports continuing to take all her prescribed medications including her valsartan, torsemide and Eliquis.   ED course: Upon arrival patient significantly hypotensive and drowsy. Sepsis protocol initiated and due to sever hypotension, central venous catheter placed and vasopressor started. Labs significant for hyponatremia, hypochloremia, AGMA, AKI on CKD, elevated Alk Phos, leukocytosis and significant lactic acidosis with fecal occult positive. Small amount of hematochezia noted in ED. Imaging significant for pan colitis without signs of toxic megacolon and cholelithiasis without cholecystis.  Medications given: cefepime/vancomycin/flagyl, levophed drip, 2.75 L IVF bolus Initial Vitals: 97.4, 15, 90, 62/35 & 100% on RA  C.Diff testing: Positive   CXR 02/27/24: no acute cardiopulmonary disease CT abdomen/pelvis wo contrast 02/27/24: Diffuse colonic wall thickening and surrounding inflammation compatible with pancolitis. Cholelithiasis.   PCCM consulted for admission due to severe sepsis with shock secondary to recurrent C.Diff colitis requiring vasopressor support.  02/27/24: Admit to ICU with severe sepsis and shock secondary to recurrent C.Diff colitis requiring vasopressor support. 02/28/24: on two pressors, rising lactic acidosis, surgery consulted, underwent colectomy 02/29/24: awake and follows commands off sedation, vasopressor requirements improved, extubated 03/01/24: remains weak but continues to improve, on low dose nor-epinephrine 03/02/24: pressor requirements improving, denies pain, tolerating sips 03/03/24: Vasopressor requirements continue to downtrend.  Tolerating clear liquids.  Patient was transferred to [facility] service on 03/04/2024  Assessment and Plan:  #Septic shock due to C. difficile colitis S/p vasopressor, gradually weaned off Monitor vitals Continue midodrine 5 mg p.o. 3 times daily  # C. difficile colitis, diverticulitis, colonic AVM and hematochezia S/p ex lap colectomy  followed  by ileostomy General Surgery consult appreciated Started diet Continue antibiotics  # Hypophosphatemia due to nutritional deficiency Phos repleted. Monitor electrolytes and replete as needed.  # Hyperkalemia, mild, resolved after Lokelma 10 g one-time dose given Monitor electrolytes daily  Respiratory failure postop, required ventilator S/p intubation, successfully extubated.  Currently saturating well on room air.  # AKI and metabolic acidosis  #Heart failure with reduced ejection fraction #Atrial fibrillation  #s/p Watchman procedure (11/2023) #CAD status post CABG S/p heparin IV infusion, transition to Eliquis Hold GDMT for now due to hypotension, resume when BP stable 4/7 Lasix 40 mg one-time dose given due to shortness of breath   # Acute blood loss anemia Monitor H&H  # IDDM T2 Hyperglycemia Started Semglee 20 units nightly Continue with NovoLog sliding scale, monitor CBG, continue diabetic diet  # Hypothyroid, continue Synthroid    Body mass index is 26.62 kg/m.  Nutrition Problem: Inadequate oral intake Etiology: acute illness Interventions:  Diet: Full liquid diet DVT Prophylaxis: Therapeutic Anticoagulation with Eliquis    Advance goals of care discussion: Full code  Family Communication: family was not present at bedside, at the time of interview.  The pt provided permission to discuss medical plan with the family. Opportunity was given to ask question and all questions were answered satisfactorily.   Disposition:  Pt is from home, admitted with septic shock due to C. difficile colitis s/p ex lap, total colectomy and ileostomy, still has low BP, on full liquid diet, which precludes a safe discharge. Discharge to home, when stable and cleared by general surgery.  Subjective: No significant events overnight, patient was sitting in the recliner and shortness of breath noticed, abdominal pain 3/10 well-controlled, and ostomy is working.  Patient  denied any other active issues.  Physical Exam: General: NAD, lying comfortably Appear in no distress, affect appropriate Eyes: PERRLA ENT: Oral Mucosa Clear, moist  Neck: no JVD,  Cardiovascular: S1 and S2 Present, no Murmur,  Respiratory: good respiratory effort, Bilateral Air entry equal and Decreased, no Crackles, no wheezes Abdomen: Bowel Sound present, Soft and mild postop tenderness, ileostomy intact Skin: no rashes Extremities: no Pedal edema, no calf tenderness Neurologic: without any new focal findings Gait not checked due to patient safety concerns  Vitals:   03/04/24 0600 03/04/24 0700 03/04/24 0800 03/04/24 0900  BP: (!) 109/59 (!) 82/46 (!) 98/55 (!) 109/56  Pulse: 87 81 91 99  Resp: 19 13 17 20   Temp:   98.6 F (37 C)   TempSrc:   Oral   SpO2: 99% 97% 98% 100%  Weight:      Height:        Intake/Output Summary (Last 24 hours) at 03/04/2024 1716 Last data filed at 03/04/2024 1500 Gross per 24 hour  Intake 1069.01 ml  Output 1250 ml  Net -180.99 ml   Filed Weights   02/28/24 0515 02/29/24 0308 03/03/24 0400  Weight: 61.3 kg 62.6 kg 63.9 kg    Data Reviewed: I have personally reviewed and interpreted daily labs, tele strips, imagings as discussed above. I reviewed all nursing notes, pharmacy notes, vitals, pertinent old records I have discussed plan of care as described above with RN and patient/family.  CBC: Recent Labs  Lab 02/27/24 1901 02/28/24 0156 02/28/24 1528 02/28/24 1749 02/29/24 0431 02/29/24 1137 03/01/24 0452 03/01/24 0701 03/02/24 0610 03/02/24 1727 03/03/24 0004 03/03/24 0531 03/04/24 0345  WBC 36.5*   < > 32.9*  --  19.3*  --  13.5*  --  12.1*  --   --  14.5* 12.6*  NEUTROABS 33.3*  --  29.6*  --   --   --   --   --   --   --   --   --   --   HGB 12.6   < > 10.2*   < > 9.3*   < > 8.3*   < > 8.0* 7.7* 8.3* 8.6* 7.9*  HCT 39.2   < > 30.6*   < > 27.5*   < > 23.9*   < > 23.5* 23.3* 25.4* 25.8* 24.5*  MCV 96.3   < > 95.0  --  94.2   --  90.9  --  95.5  --   --  93.8 97.2  PLT 279   < > 268  --  209  --  115*  --  126*  --   --  143* 104*   < > = values in this interval not displayed.   Basic Metabolic Panel: Recent Labs  Lab 02/29/24 0431 02/29/24 1652 03/01/24 0452 03/02/24 0610 03/03/24 0531 03/04/24 0345 03/04/24 0346 03/04/24 1600  NA 131*   < > 130* 133* 137 135 135 133*  K 3.7   < > 3.6 3.2* 4.6 5.3* 5.3* 4.5  CL 88*   < > 90* 95* 101 103 102 102  CO2 16*   < > 25 28 29 26 26 23   GLUCOSE 200*   < > 148* 235* 101* 131* 132* 304*  BUN 76*   < > 68* 58* 41* 35* 36* 35*  CREATININE 4.90*   < > 3.82* 2.42* 1.25* 1.03* 0.98 0.93  CALCIUM 7.6*   < > 8.4* 8.5* 8.6* 8.4* 8.4* 7.9*  MG 1.5*  --  2.0 2.0 1.7 2.2  --   --   PHOS 6.3*  --  4.4 3.3 1.3*  --  <1.0* 1.9*   < > = values in this interval not displayed.    Studies: No results found.  Scheduled Meds:  apixaban  5 mg Oral BID   Chlorhexidine Gluconate Cloth  6 each Topical Daily   feeding supplement  237 mL Oral TID BM   insulin aspart  0-20 Units Subcutaneous Q4H   latanoprost  1 drop Both Eyes QHS   levothyroxine  50 mcg Oral Q0600   lidocaine  1 patch Transdermal Q0600   midodrine  5 mg Oral TID WC   [START ON 03/05/2024] multivitamin with minerals  1 tablet Oral Daily   pantoprazole  40 mg Oral Daily   thiamine  100 mg Oral Daily   Continuous Infusions: PRN Meds: acetaminophen, albuterol, morphine injection, mouth rinse, oxyCODONE  Time spent: 55 minutes  Author: Gillis Santa. MD [facility] 03/04/2024 5:16 PM  To reach On-call, see care teams to locate the attending and reach out to them via [email or web address]. If 7PM-7AM, please contact night-coverage If you still have difficulty reaching the attending provider, please page the [facility] (Director on Call) for [facility] on amion for assistance.

## 2024-03-04 NOTE — Progress Notes (Signed)
 Nutrition Follow Up Note   DOCUMENTATION CODES:   Not applicable  INTERVENTION:  Ensure Enlive po TID, each supplement provides 350 kcal and 20 grams of protein.  MVI po daily   Thiamine 100mg  po daily   Pt at high refeed risk; recommend monitor potassium, magnesium and phosphorus labs daily until stable  Daily weights   NUTRITION DIAGNOSIS:   Inadequate oral intake related to acute illness as evidenced by NPO status -resolved   GOAL:   Patient will meet greater than or equal to 90% of their needs -not met   MONITOR:   PO intake, Supplement acceptance, Labs, Weight trends, I & O's, Skin  ASSESSMENT:   68 y/o female with h/o ischemic cardiomyopathy, gastric AVM, GI angiodysplasia, IBS-D, IDA, s/p watchman device, cholelithiasis, TIA, hypothyroidism, pulmonary hypertension, GERD, barrett's esophagus, HLD, HTN, OSA, CAD s/p CABG x 3, PAF, CHF, cirrhosis, portal hypertension, gout, CKD III, B12 deficiency, ventral hernia s/p mesh repair 2018, colonic angioectasias, diverticulosis, DDD, hiatal hernia, esophageal varices and recent admission to [facility] with C-diff and CHF who is admitted with sepsis, shock, pancolitis, AKI and GIB.  -Pt s/p exploratory laparotomy, total colectomy and end ileostomy 4/3  Met with pt in room today. Pt reports that she is feeling much better but does report that she feels bloated today. Pt concerned that she may be getting volume overloaded again as she reports gaining fluid in her abdomen before. Per chart, pt is up ~14lbs from her UBW and is up 6lbs since admission; MD aware. TPN initiated 4/4 and discontinued 4/5; RD unsure why TPN was discontinued. Pt eating 100% of her clear liquid diet. Pt drank 100% of an Ensure this morning. Pt advanced to full liquids today. RD discussed again with pt the importance of adequate nutrition needed to preserve lean muscle and support post op healing. Pt is actively refeeding; electrolytes being monitored and  supplemented. Will add thiamine.     Medications reviewed and include: insulin, synthroid, protonix, sodium phosphate   Labs reviewed: Na 135 wnl, K 5.3(H), BUN 36(H), P <1.0(L), Mg 2.2 wnl Triglycerides 31(H) Wbc- 12.6(H), Hgb 7.9(L), Hct 24.5(L) Cbgs- 115, 134 x 24 hrs   Diet Order:   Diet Order             Diet full liquid Fluid consistency: Thin  Diet effective now                  EDUCATION NEEDS:   Education needs have been addressed  Skin:  Skin Assessment: Reviewed RN Assessment  Last BM:  4/7- via ostomy  Height:   Ht Readings from Last 1 Encounters:  02/28/24 5\' 1"  (1.549 m)    Weight:   Wt Readings from Last 1 Encounters:  03/03/24 63.9 kg    Ideal Body Weight:  47.7 kg  BMI:  Body mass index is 26.62 kg/m.  Estimated Nutritional Needs:   Kcal:  1600-1800kcal/day  Protein:  80-90g/day  Fluid:  1.3-1.5L/day  Betsey Holiday MS, RD, LDN If unable to be reached, please send secure chat to "RD inpatient" available from 8:00a-4:00p daily

## 2024-03-04 NOTE — Progress Notes (Signed)
 [facility] SURGICAL PROGRESS NOTE  Hospital Day(s): 6.   Post op day(s): 5 Days Post-Op.   Interval History:  Patient seen and examined No acute events or new complaints overnight.  She is sitting up in chair Reports abdomen is sore; full feeling but not overtly distended No fever, chills Leukocytosis is improving; WBC 12.6K Hgb to 7.9 Renal function improving; sCr - 0.98; UO - 650 ccs + unmeasured Hyperkalemia to 5.3 Hypophosphatemia to <1.0 Ileostomy; 1150 ccs out; loose CLD; tolerating  Vital signs in last 24 hours: [min-max] current  Temp:  [97.7 F (36.5 C)-98.8 F (37.1 C)] 97.7 F (36.5 C) (04/07 0400) Pulse Rate:  [80-102] 87 (04/07 0600) Resp:  [10-23] 19 (04/07 0600) BP: (82-117)/(42-89) 109/59 (04/07 0600) SpO2:  [96 %-99 %] 99 % (04/07 0600) Arterial Line BP: (86-122)/(43-57) 94/46 (04/06 1700)     Height: 5\' 1"  (154.9 cm) Weight:  (unable to get d/t bed malfunction) BMI (Calculated): 26.63   Intake/Output last 2 shifts:  04/06 0701 - 04/07 0700 In: 1427.7 [P.O.:810; I.V.:167.7; IV Piggyback:450] Out: 1800 [Urine:650; Stool:1150]   Physical Exam:  Constitutional: Sitting up in chair, NAD Respiratory: On [place], no respiratory distress Cardiovascular: tachycardic to 101 bpm and irregular Gastrointestinal: soft, expected tenderness, non-distended, no rebound/guarding. Ileostomy in RLQ; loose stool in bag  Integumentary: Laparotomy is CDI with staples, no erythema or drainage   Labs:     Latest Ref Rng & Units 03/04/2024    3:45 AM 03/03/2024    5:31 AM 03/03/2024   12:04 AM  CBC  WBC 4.0 - 10.5 K/uL 12.6  14.5    Hemoglobin 12.0 - 15.0 g/dL 7.9  8.6  8.3   Hematocrit 36.0 - 46.0 % 24.5  25.8  25.4   Platelets 150 - 400 K/uL 104  143        Latest Ref Rng & Units 03/04/2024    3:46 AM 03/04/2024    3:45 AM 03/03/2024    5:31 AM  CMP  Glucose 70 - 99 mg/dL 098  119  147   BUN 8 - 23 mg/dL 36  35  41   Creatinine 0.44 - 1.00 mg/dL 8.29  5.62   1.30   Sodium 135 - 145 mmol/L 135  135  137   Potassium 3.5 - 5.1 mmol/L 5.3  5.3  4.6   Chloride 98 - 111 mmol/L 102  103  101   CO2 22 - 32 mmol/L 26  26  29    Calcium 8.9 - 10.3 mg/dL 8.4  8.4  8.6   Total Protein 6.5 - 8.1 g/dL  5.1    Total Bilirubin 0.0 - 1.2 mg/dL  1.0    Alkaline Phos 38 - 126 U/L  210    AST 15 - 41 U/L  40    ALT 0 - 44 U/L  23      Imaging studies: No new pertinent imaging studies   Assessment/Plan:  68 y.o. female 5 Days Post-Op s/p exploratory laparotomy, total colectomy, and end ileostomy creation for septic shock secondary to C diff colitis   - We can advance to FLD this morning; advance as tolerated  - Correct electrolytes  - Monitor abdominal examination - Monitor ileostomy output; as she recovers, will likely need to start antidiarrheal to prevent dehydration  - Pain control prn; okay to resume PO medications  - WOC RN for ostomy care/teaching   - Monitor leukocytosis; improved  - Working with therapies  - Further  management per primary service; we will follow   All of the above findings and recommendations were discussed with the patient and the medical team.  -- Lynden Oxford, PA-C [facility] 03/04/2024, 7:24 AM M-F: 7am - 4pm

## 2024-03-04 NOTE — Progress Notes (Signed)
 [facility] [place], [place] 03/04/24  Subjective:   Hospital day # 6  Tammy Arias is a 68 y.o. female with medical problems of coronary disease status post CABG, ischemic cardiomyopathy, history of atrial fibrillation status post Watchman procedure, pulmonary hypertension   was admitted on 02/27/2024 for severe C. difficile colitis and she is status post total colectomy and end ileostomy.  Hospital course is complicated by septic shock.  Patient seen resting in bed Alert and oriented Appetite slowly improving  Creatinine 0.98 Urine output 650 mL   Objective:  Vital signs in last 24 hours:  Temp:  [97.7 F (36.5 C)-98.6 F (37 C)] 98.6 F (37 C) (04/07 0800) Pulse Rate:  [80-102] 99 (04/07 0900) Resp:  [10-20] 20 (04/07 0900) BP: (82-117)/(42-89) 109/56 (04/07 0900) SpO2:  [96 %-100 %] 100 % (04/07 0900) Arterial Line BP: (86-115)/(43-55) 94/46 (04/06 1700)  Weight change:  Filed Weights   02/28/24 0515 02/29/24 0308 03/03/24 0400  Weight: 61.3 kg 62.6 kg 63.9 kg    Intake/Output:    Intake/Output Summary (Last 24 hours) at 03/04/2024 1255 Last data filed at 03/04/2024 0630 Gross per 24 hour  Intake 1389.13 ml  Output 1250 ml  Net 139.13 ml     Physical Exam: General: NAD, laying in the bed  HEENT Moist oral mucous membranes  Pulm/lungs Clear to auscultation [place] O2  CVS/Heart Irregular, tachycardic  Abdomen:  Soft  Extremities: No edema  Neurologic: Able to follow simple commands  Skin: No acute rashes   Ileostomy       Basic Metabolic Panel:  Recent Labs  Lab 02/29/24 0431 02/29/24 1652 03/01/24 0452 03/02/24 0610 03/03/24 0531 03/04/24 0345 03/04/24 0346  NA 131*   < > 130* 133* 137 135 135  K 3.7   < > 3.6 3.2* 4.6 5.3* 5.3*  CL 88*   < > 90* 95* 101 103 102  CO2 16*   < > 25 28 29 26 26   GLUCOSE 200*   < > 148* 235* 101* 131* 132*  BUN 76*   < > 68* 58* 41* 35* 36*  CREATININE 4.90*   < > 3.82* 2.42* 1.25* 1.03* 0.98   CALCIUM 7.6*   < > 8.4* 8.5* 8.6* 8.4* 8.4*  MG 1.5*  --  2.0 2.0 1.7 2.2  --   PHOS 6.3*  --  4.4 3.3 1.3*  --  <1.0*   < > = values in this interval not displayed.     CBC: Recent Labs  Lab 02/27/24 1901 02/28/24 0156 02/28/24 1528 02/28/24 1749 02/29/24 0431 02/29/24 1137 03/01/24 0452 03/01/24 0701 03/02/24 0610 03/02/24 1727 03/03/24 0004 03/03/24 0531 03/04/24 0345  WBC 36.5*   < > 32.9*  --  19.3*  --  13.5*  --  12.1*  --   --  14.5* 12.6*  NEUTROABS 33.3*  --  29.6*  --   --   --   --   --   --   --   --   --   --   HGB 12.6   < > 10.2*   < > 9.3*   < > 8.3*   < > 8.0* 7.7* 8.3* 8.6* 7.9*  HCT 39.2   < > 30.6*   < > 27.5*   < > 23.9*   < > 23.5* 23.3* 25.4* 25.8* 24.5*  MCV 96.3   < > 95.0  --  94.2  --  90.9  --  95.5  --   --  93.8 97.2  PLT 279   < > 268  --  209  --  115*  --  126*  --   --  143* 104*   < > = values in this interval not displayed.      Lab Results  Component Value Date   HEPBSAG Negative 12/19/2018      Microbiology:  Recent Results (from the past 240 hours)  C Difficile Quick Screen w PCR reflex     Status: Abnormal   Collection Time: 02/27/24  7:34 PM   Specimen: STOOL  Result Value Ref Range Status   C Diff antigen POSITIVE (A) NEGATIVE Final   C Diff toxin POSITIVE (A) NEGATIVE Final   C Diff interpretation Toxin producing C. difficile detected.  Final    Comment: CRITICAL RESULT CALLED TO, READ BACK BY AND VERIFIED WITH: AMANDA FOX RN @2254  02/27/24 ASW Performed at [facility], [address] [place], [place] [address]   Gastrointestinal Panel by PCR , Stool     Status: None   Collection Time: 02/27/24  7:34 PM   Specimen: STOOL  Result Value Ref Range Status   Campylobacter species NOT DETECTED NOT DETECTED Final   Plesimonas shigelloides NOT DETECTED NOT DETECTED Final   Salmonella species NOT DETECTED NOT DETECTED Final   Yersinia enterocolitica NOT DETECTED NOT DETECTED Final   Vibrio species NOT  DETECTED NOT DETECTED Final   Vibrio cholerae NOT DETECTED NOT DETECTED Final   Enteroaggregative E coli (EAEC) NOT DETECTED NOT DETECTED Final   Enteropathogenic E coli (EPEC) NOT DETECTED NOT DETECTED Final   Enterotoxigenic E coli (ETEC) NOT DETECTED NOT DETECTED Final   Shiga like toxin producing E coli (STEC) NOT DETECTED NOT DETECTED Final   Shigella/Enteroinvasive E coli (EIEC) NOT DETECTED NOT DETECTED Final   Cryptosporidium NOT DETECTED NOT DETECTED Final   Cyclospora cayetanensis NOT DETECTED NOT DETECTED Final   Entamoeba histolytica NOT DETECTED NOT DETECTED Final   Giardia lamblia NOT DETECTED NOT DETECTED Final   Adenovirus F40/41 NOT DETECTED NOT DETECTED Final   Astrovirus NOT DETECTED NOT DETECTED Final   Norovirus GI/GII NOT DETECTED NOT DETECTED Final   Rotavirus A NOT DETECTED NOT DETECTED Final   Sapovirus (I, II, IV, and V) NOT DETECTED NOT DETECTED Final    Comment: Performed at [facility], [address] [place], [place] [address]  MRSA Next Gen by PCR, Nasal     Status: None   Collection Time: 02/27/24 10:01 PM   Specimen: Peripheral; Nasal Swab  Result Value Ref Range Status   MRSA by PCR Next Gen NOT DETECTED NOT DETECTED Final    Comment: (NOTE) The GeneXpert MRSA Assay (FDA approved for NASAL specimens only), is one component of a comprehensive MRSA colonization surveillance program. It is not intended to diagnose MRSA infection nor to guide or monitor treatment for MRSA infections. Test performance is not FDA approved in patients less than 90 years old. Performed at [facility], [address] [place], [place] [address]   Culture, blood (Routine X 2) w Reflex to ID Panel     Status: None   Collection Time: 02/28/24  2:17 AM   Specimen: BLOOD LEFT HAND  Result Value Ref Range Status   Specimen Description BLOOD LEFT HAND  Final   Special Requests   Final    BOTTLES DRAWN AEROBIC AND ANAEROBIC Blood Culture results may not be  optimal due to an inadequate volume of blood received in culture  bottles   Culture   Final    NO GROWTH 5 DAYS Performed at [facility], [address] [place], [place] [address]    Report Status 03/04/2024 FINAL  Final  Culture, blood (Routine X 2) w Reflex to ID Panel     Status: None   Collection Time: 02/28/24  2:17 AM   Specimen: BLOOD LEFT ARM  Result Value Ref Range Status   Specimen Description BLOOD LEFT ARM  Final   Special Requests   Final    BOTTLES DRAWN AEROBIC AND ANAEROBIC Blood Culture results may not be optimal due to an inadequate volume of blood received in culture bottles   Culture   Final    NO GROWTH 5 DAYS Performed at [facility], [address] [place], [place] [address]    Report Status 03/04/2024 FINAL  Final    Coagulation Studies: Recent Labs    03/02/24 1141  LABPROT 17.4*  INR 1.4*    Urinalysis: No results for input(s): "COLORURINE", "LABSPEC", "PHURINE", "GLUCOSEU", "HGBUR", "BILIRUBINUR", "KETONESUR", "PROTEINUR", "UROBILINOGEN", "NITRITE", "LEUKOCYTESUR" in the last 72 hours.  Invalid input(s): "APPERANCEUR"     Imaging: No results found.   Medications:    norepinephrine (LEVOPHED) Adult infusion Stopped (03/03/24 1049)   sodium PHOSPHATE IVPB (in mmol) 30 mmol (03/04/24 0908)    apixaban  5 mg Oral BID   Chlorhexidine Gluconate Cloth  6 each Topical Daily   feeding supplement  237 mL Oral TID BM   insulin aspart  0-20 Units Subcutaneous Q4H   latanoprost  1 drop Both Eyes QHS   levothyroxine  50 mcg Oral Q0600   lidocaine  1 patch Transdermal Q0600   midodrine  5 mg Oral TID WC   [START ON 03/05/2024] multivitamin with minerals  1 tablet Oral Daily   pantoprazole  40 mg Oral Daily   acetaminophen, albuterol, morphine injection, mouth rinse, oxyCODONE  Assessment/ Plan:  68 y.o. female with  medical problems of coronary disease status post CABG, ischemic cardiomyopathy, history of atrial fibrillation  status post Watchman procedure, pulmonary hypertension  admitted on 02/27/2024 for Pancolitis (HCC) [K51.00] Severe sepsis with septic shock (CODE) (HCC) [R65.21] Sepsis with acute renal failure and septic shock, due to unspecified organism, unspecified acute renal failure type (HCC) [A41.9, R65.21, N17.9]   1.  Acute kidney injury on chronic kidney disease stage IIIa (06/06/2023) AKI likely secondary to severe ATN from concurrent illness Admit creatinine critically elevated at 7.04 which has steadily improved and is down to 3.82 today.  Creatinine has returned to baseline.  Patient making adequate urine output.   2.  Severe C. difficile colitis, status post total colectomy and end ileostomy, septic shock -Management as per surgery team.   3.  Chronic systolic CHF, ischemic cardiomyopathy EF 35%, atrial fibrillation, pulmonary hypertension -Management as per cardiology team. - Pressors stopped   4.  Hyponatremia Likely due to AKI and acute illness. Stable at 135   5.  Hyperkalemia, potassium 5.3. Lokelma ordered by primary team.   Due to renal recovery, we will sign out at this time.     LOS: 6 Wendee Beavers 4/7/202512:55 PM  [facility] [address] [place], [place] [phone]

## 2024-03-04 NOTE — Plan of Care (Signed)
 Discussed with patient plan of care for the evening, pain management and medications with some teach back displayed.  She wanted a bath earlier and wants to go to bed at [facility],  Problem: Education: Goal: Knowledge of General Education information will improve Description: Including pain rating scale, medication(s)/side effects and non-pharmacologic comfort measures Outcome: Progressing   Problem: Health Behavior/Discharge Planning: Goal: Ability to manage health-related needs will improve Outcome: Progressing

## 2024-03-04 NOTE — Progress Notes (Signed)
 [facility] CLINIC CARDIOLOGY PROGRESS NOTE       Patient ID: Tammy Arias MRN: [id number] DOB/AGE: 1956-06-25 68 y.o.  Admit date: 02/27/2024 Referring Physician Dr. Aundria Rud Primary Physician Jerl Mina, MD  Primary Cardiologist Dr. Juliann Pares Reason for Consultation Medication recommendations, chronic heart failure  HPI: Tammy Arias is a 68 y.o. female  with a past medical history of coronary artery disease s/p CABG, ischemic cardiomyopathy, history of atrial fibrillation s/p Watchman, pulmonary hypertension who presented to the ED on 02/27/2024 for diarrhea. Cardiology was consulted for further evaluation.   Interval history: -Patient seen and examined this morning, resting comfortably in bed. -No chest pain, shortness of breath this a.m. -Off pressors over the weekend.  BP remains soft.  She is without any lightheadedness/dizziness, got out of bed yesterday with no orthostasis. -Remains in atrial fibrillation, rates controlled  Review of systems complete and found to be negative unless listed above    Past Medical History:  Diagnosis Date   AC (acromioclavicular) joint bone spurs, right    Anemia    Arthritis    Atrial fibrillation (HCC)    a.) CHA2DS2-VASc = 7 (age, sex, HFrEF, HTN, TIA x 2, T2DM). b.) rate/rhythm controlled on oral carvedilol; chronically anticoagulated with warfarin.   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 b.) R/LHC 03/29/2017: small RCA with occluded SVG; no significant Dz. Chronically occluded LAD with patent SVG to D1/LAD. Insignificant Dz in LCx. LM normal.   Cortical cataract    DOE (dyspnea on exertion)    DOE (dyspnea on exertion)    GERD (gastroesophageal reflux disease)    HFrEF (heart failure with reduced ejection fraction) (HCC)    a.) TTE 10/27/2014: EF 40%; glob HK; mild BAE; triv  AR, mild MR/PR, mod TR.  b.) TTE 03/15/2017: EF 30%; glob HK; mod BAE; mod pHTN (RVSP 54.8 mmHg); mild PR, mod MR; sev TR.  c.) R/LHC 03/29/2017: EF 30-35%. d.)TTE 10/25/2018: EF 35%; mod BAE; mod BVE; glob HK; triv PR, mod MR, sev TR; RVSP 57.7 mmHg; G2DD.   History of shingles 2004   Hyperlipidemia    Hypertension    Hypothyroidism    IBS (irritable bowel syndrome)    Ischemic cardiomyopathy    a.) TTE 10/27/2014: EF 40%. b.) TTE 03/15/2017: EF 30%. c.) R/LHC 04/01/2017: EF 30-35%. d.) TTE 10/25/2018: EF 35%   Lumbar scoliosis    Lumbar spinal stenosis    Migraines    Osteoporosis    Pulmonary HTN (HCC)    a.) TTE 03/15/2017: EF 30-35%; RVSP 54.8 mmHg. 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   Sleep apnea    T2DM (type 2 diabetes mellitus) (HCC)    TIA (transient ischemic attack) 05/29/2016   Vitamin B 12 deficiency     Past Surgical History:  Procedure Laterality Date   CARDIAC CATHETERIZATION     CATARACT EXTRACTION W/ INTRAOCULAR LENS IMPLANT Bilateral    Cataract Extraction with IOL   COLECTOMY  02/28/2024   Procedure: COLECTOMY, TOTAL;  Surgeon: Campbell Lerner, MD;  Location: [facility] ORS;  Service: General;;   COLONOSCOPY N/A 12/19/2018   Procedure: COLONOSCOPY;  Surgeon: Pasty Spillers,  MD;  Location: [facility] ENDOSCOPY;  Service: Endoscopy;  Laterality: N/A;   COLONOSCOPY WITH PROPOFOL N/A 06/17/2020   Procedure: COLONOSCOPY WITH PROPOFOL;  Surgeon: Pasty Spillers, MD;  Location: [facility] ENDOSCOPY;  Service: Endoscopy;  Laterality: N/A;   COLONOSCOPY WITH PROPOFOL N/A 03/30/2023   Procedure: COLONOSCOPY WITH PROPOFOL;  Surgeon: Jaynie Collins, DO;  Location: [facility] ENDOSCOPY;  Service: Endoscopy;  Laterality: N/A;   COR TRIATRIATUM REPAIR N/A 01/28/2004   CORONARY ARTERY BYPASS GRAFT N/A 01/28/2004   3v CABG   ESOPHAGOGASTRODUODENOSCOPY N/A 12/19/2018   Procedure:  ESOPHAGOGASTRODUODENOSCOPY (EGD);  Surgeon: Pasty Spillers, MD;  Location: [facility] ENDOSCOPY;  Service: Endoscopy;  Laterality: N/A;   ESOPHAGOGASTRODUODENOSCOPY (EGD) WITH PROPOFOL N/A 06/17/2020   Procedure: ESOPHAGOGASTRODUODENOSCOPY (EGD) WITH PROPOFOL;  Surgeon: Pasty Spillers, MD;  Location: [facility] ENDOSCOPY;  Service: Endoscopy;  Laterality: N/A;   ESOPHAGOGASTRODUODENOSCOPY (EGD) WITH PROPOFOL N/A 03/30/2023   Procedure: ESOPHAGOGASTRODUODENOSCOPY (EGD) WITH PROPOFOL;  Surgeon: Jaynie Collins, DO;  Location: [facility] ENDOSCOPY;  Service: Endoscopy;  Laterality: N/A;   LAPAROTOMY N/A 02/28/2024   Procedure: LAPAROTOMY, EXPLORATORY;  Surgeon: Campbell Lerner, MD;  Location: [facility] ORS;  Service: General;  Laterality: N/A;   RIGHT/LEFT HEART CATH AND CORONARY ANGIOGRAPHY Bilateral 03/29/2017   Procedure: Right/Left Heart Cath and Coronary Angiography;  Surgeon: Dalia Heading, MD;  Location: [facility] INVASIVE CV LAB;  Service: Cardiovascular;  Laterality: Bilateral;   TOTAL KNEE ARTHROPLASTY Right 04/05/2022   Procedure: TOTAL KNEE ARTHROPLASTY;  Surgeon: Kennedy Bucker, MD;  Location: [facility] ORS;  Service: Orthopedics;  Laterality: Right;   TUBAL LIGATION     VENTRAL HERNIA REPAIR N/A 04/21/2017   Procedure: HERNIA REPAIR VENTRAL ADULT;  Surgeon: Nadeen Landau, MD;  Location: [facility] ORS;  Service: General;  Laterality: N/A;    Medications Prior to Admission  Medication Sig Dispense Refill Last Dose/Taking   albuterol (PROVENTIL HFA;VENTOLIN HFA) 108 (90 Base) MCG/ACT inhaler Inhale 2 puffs into the lungs every 6 (six) hours as needed for wheezing or shortness of breath.   Past Week   allopurinol (ZYLOPRIM) 300 MG tablet Take 300 mg by mouth daily.   02/27/2024   colchicine 0.6 MG tablet Take 0.6-1.8 mg by mouth as needed (take 1.2 mg (2 tablets) at onset of gout flare, Take one additinaltablet one hour later if symptoms persist. Maximum of 3 tablets per day).   02/27/2024   ELIQUIS 5 MG TABS  tablet Take 5 mg by mouth 2 (two) times daily.   02/27/2024 Morning   JARDIANCE 10 MG TABS tablet Take 10 mg by mouth daily.   02/27/2024   latanoprost (XALATAN) 0.005 % ophthalmic solution Place 1 drop into both eyes at bedtime.   02/27/2024   levothyroxine (SYNTHROID) 50 MCG tablet Take 50 mcg by mouth daily before breakfast.   02/27/2024   metFORMIN (GLUCOPHAGE) 1000 MG tablet Take 1,000 mg by mouth 2 (two) times daily with a meal.   02/27/2024 Morning   pantoprazole (PROTONIX) 40 MG tablet Take 40 mg by mouth daily.   02/27/2024   rosuvastatin (CRESTOR) 5 MG tablet Take 5 mg by mouth daily.   02/27/2024   spironolactone (ALDACTONE) 25 MG tablet Take 25 mg by mouth daily.   02/27/2024   sucralfate (CARAFATE) 1 g tablet Take 1 g by mouth 4 (four) times daily -  with meals and at bedtime.   02/27/2024   torsemide (DEMADEX) 20 MG tablet Take 20 mg by mouth 2 (two) times daily.   02/27/2024 Morning  valsartan (DIOVAN) 40 MG tablet Take 20 mg by mouth 2 (two) times daily.   02/27/2024 Morning   vitamin B-12 (CYANOCOBALAMIN) 1000 MCG tablet Take 1,000 mcg by mouth daily.   02/27/2024   Social History   Socioeconomic History   Marital status: Widowed    Spouse name: Not on file   Number of children: Not on file   Years of education: Not on file   Highest education level: Not on file  Occupational History   Occupation: certified nursing assistant  Tobacco Use   Smoking status: Former    Current packs/day: 0.00    Types: Cigarettes    Quit date: 03/30/1999    Years since quitting: 24.9   Smokeless tobacco: Never  Vaping Use   Vaping status: Never Used  Substance and Sexual Activity   Alcohol use: No   Drug use: No   Sexual activity: Not Currently  Other Topics Concern   Not on file  Social History Narrative   Not on file   Social Drivers of Health   Financial Resource Strain: Low Risk  (02/11/2024)   Received from [facility]   Overall Financial Resource Strain (CARDIA)    Difficulty  of Paying Living Expenses: Not very hard  Recent Concern: Financial Resource Strain - Medium Risk (12/13/2023)   Received from [facility]   Overall Financial Resource Strain (CARDIA)    Difficulty of Paying Living Expenses: Somewhat hard  Food Insecurity: No Food Insecurity (02/28/2024)   Hunger Vital Sign    Worried About Running Out of Food in the Last Year: Never true    Ran Out of Food in the Last Year: Never true  Recent Concern: Food Insecurity - Food Insecurity Present (12/13/2023)   Received from [facility]   Hunger Vital Sign    Ran Out of Food in the Last Year: Sometimes true    Worried About Running Out of Food in the Last Year: Never true  Transportation Needs: No Transportation Needs (02/28/2024)   PRAPARE - Administrator, Civil Service (Medical): No    Lack of Transportation (Non-Medical): No  Physical Activity: Not on file  Stress: Not on file  Social Connections: Moderately Integrated (02/28/2024)   Social Connection and Isolation Panel [NHANES]    Frequency of Communication with Friends and Family: Three times a week    Frequency of Social Gatherings with Friends and Family: Three times a week    Attends Religious Services: More than 4 times per year    Active Member of Clubs or Organizations: Yes    Attends Banker Meetings: More than 4 times per year    Marital Status: Widowed  Intimate Partner Violence: Not At Risk (02/28/2024)   Humiliation, Afraid, Rape, and Kick questionnaire    Fear of Current or Ex-Partner: No    Emotionally Abused: No    Physically Abused: No    Sexually Abused: No    Family History  Problem Relation Age of Onset   Valvular heart disease Mother    Diabetes Father    Cancer Sister        lung   Cancer Sister        cervical   Breast cancer Cousin      Vitals:   03/04/24 0300 03/04/24 0400 03/04/24 0500 03/04/24 0600  BP: (!) 85/52 (!) 94/55 (!) 99/46 (!) 109/59  Pulse: 86 88  92 87  Resp: 13 17 16  19  Temp:  97.7 F (36.5 C)    TempSrc:  Oral    SpO2: 96% 99% 98% 99%  Weight:      Height:        PHYSICAL EXAM General: Ill appearing elderly female, well nourished, in no acute distress. HEENT: Normocephalic and atraumatic. Neck: No JVD.  Lungs: Normal respiratory effort on room air. Clear bilaterally to auscultation. No wheezes, crackles, rhonchi.  Heart: Irregularly irregular, controlled rate. Normal S1 and S2 without gallops or murmurs.  Abdomen: Non-distended appearing.  Msk: Normal strength and tone for age. Extremities: Warm and well perfused. No clubbing, cyanosis. No edema.  Neuro: Alert and oriented X 3. Psych: Answers questions appropriately.   Labs: Basic Metabolic Panel: Recent Labs    03/03/24 0531 03/04/24 0345 03/04/24 0346  NA 137 135 135  K 4.6 5.3* 5.3*  CL 101 103 102  CO2 29 26 26   GLUCOSE 101* 131* 132*  BUN 41* 35* 36*  CREATININE 1.25* 1.03* 0.98  CALCIUM 8.6* 8.4* 8.4*  MG 1.7 2.2  --   PHOS 1.3*  --  <1.0*   Liver Function Tests: Recent Labs    03/04/24 0345 03/04/24 0346  AST 40  --   ALT 23  --   ALKPHOS 210*  --   BILITOT 1.0  --   PROT 5.1*  --   ALBUMIN 2.6* 2.6*   No results for input(s): "LIPASE", "AMYLASE" in the last 72 hours.  CBC: Recent Labs    03/03/24 0531 03/04/24 0345  WBC 14.5* 12.6*  HGB 8.6* 7.9*  HCT 25.8* 24.5*  MCV 93.8 97.2  PLT 143* 104*   Cardiac Enzymes: No results for input(s): "CKTOTAL", "CKMB", "CKMBINDEX", "TROPONINIHS" in the last 72 hours.  BNP: No results for input(s): "BNP" in the last 72 hours.  D-Dimer: No results for input(s): "DDIMER" in the last 72 hours. Hemoglobin A1C: No results for input(s): "HGBA1C" in the last 72 hours.  Fasting Lipid Panel: Recent Labs    03/04/24 0346  TRIG 31   Thyroid Function Tests: No results for input(s): "TSH", "T4TOTAL", "T3FREE", "THYROIDAB" in the last 72 hours.  Invalid input(s): "FREET3"  Anemia Panel: No  results for input(s): "VITAMINB12", "FOLATE", "FERRITIN", "TIBC", "IRON", "RETICCTPCT" in the last 72 hours.   Radiology: [facility] Chest Port 1 View Result Date: 02/28/2024 CLINICAL DATA:  Intubated EXAM: PORTABLE CHEST 1 VIEW COMPARISON:  02/27/2024 FINDINGS: Single frontal view of the chest demonstrates endotracheal tube overlying tracheal air column, tip just below thoracic inlet. Enteric catheter passes below diaphragm, tip and side port projecting over the gastric fundus. Right internal jugular catheter tip overlies the right atrium. Abandoned epicardial pacing wires are noted. Occlusion device in the region of the left atrial appendage unchanged. Cardiac silhouette is mildly enlarged but stable. No acute airspace disease, effusion, or pneumothorax. No acute bony abnormalities. IMPRESSION: 1. Support devices as above. 2. No acute airspace disease. Electronically Signed   By: Sharlet Salina M.D.   On: 02/28/2024 17:05   CT ABDOMEN PELVIS WO CONTRAST Result Date: 02/27/2024 CLINICAL DATA:  Diarrhea, sepsis, vomiting EXAM: CT ABDOMEN AND PELVIS WITHOUT CONTRAST TECHNIQUE: Multidetector CT imaging of the abdomen and pelvis was performed following the standard protocol without IV contrast. RADIATION DOSE REDUCTION: This exam was performed according to the departmental dose-optimization program which includes automated exposure control, adjustment of the mA and/or kV according to patient size and/or use of iterative reconstruction technique. COMPARISON:  06/06/2023 FINDINGS: Lower chest: Cardiomegaly. Minimal right base linear  atelectasis or scarring. No effusions. Hepatobiliary: Layering gallstones within the gallbladder, stable. No focal hepatic abnormality or biliary ductal dilatation. Pancreas: No focal abnormality or ductal dilatation. Spleen: No focal abnormality.  Normal size. Adrenals/Urinary Tract: No adrenal abnormality. No focal renal abnormality. No stones or hydronephrosis. Urinary bladder is unremarkable.  Stomach/Bowel: Diffuse colonic wall thickening and surrounding inflammation compatible with pancolitis. Stomach and small bowel decompressed. No bowel obstruction. Vascular/Lymphatic: No evidence of aneurysm or adenopathy. Aortic atherosclerosis. Reproductive: Uterus and adnexa unremarkable.  No mass. Other: No free fluid or free air. Musculoskeletal: No acute bony abnormality. IMPRESSION: Diffuse colonic wall thickening and surrounding inflammation compatible with pancolitis. Cholelithiasis. Aortic atherosclerosis. Electronically Signed   By: Charlett Nose M.D.   On: 02/27/2024 21:42   DG Chest Port 1 View Result Date: 02/27/2024 CLINICAL DATA:  Central line placement EXAM: PORTABLE CHEST 1 VIEW COMPARISON:  12/17/2018 FINDINGS: Right central line tip in the right atrium. No pneumothorax. Prior median sternotomy. Heart is borderline in size. No confluent airspace opacities or effusions. No acute bony abnormality. IMPRESSION: Right central line tip in the right atrium.  No pneumothorax. No acute cardiopulmonary disease. Electronically Signed   By: Charlett Nose M.D.   On: 02/27/2024 20:36    ECHO 01/2024: 1. Severe centrally directed tricuspid regurgitation.  2. Severe mitral regurgitation - two separate jets of centrally directed regurgitation.  3. Moderately reduced LV systolic function. E ~35% 4. Severely reduced RV systolic function.  5. There is a left atrial appendage occlusive device in place with a gap seen by 2D and peri device leak seen by color Doppler between the inferior aspect of the device and the left atrium.  6. Small, likely iatrogenic atrial septal defect s/p atrial septostomy. By color Doppler, inter-atrial flow appears to be bidirectional.   TELEMETRY reviewed by me 03/04/2024: atrial fibrillation rate 80-90s  EKG reviewed by me: atrial fibrillation rate 86 bpm  Data reviewed by me 03/04/2024: last 24h vitals tele labs imaging I/O hospitalist progress note, critical care progress note,  surgery progress note  Principal Problem:   Severe sepsis with septic shock (CODE) (HCC) Active Problems:   Pancolitis (HCC)   Clostridium difficile colitis   Sepsis with acute renal failure and septic shock (HCC)    ASSESSMENT AND PLAN:  Tammy Arias is a 68 y.o. female  with a past medical history of coronary artery disease s/p CABG, ischemic cardiomyopathy, history of atrial fibrillation s/p Watchman, pulmonary hypertension who presented to the ED on 02/27/2024 for diarrhea. Cardiology was consulted for further evaluation.   # Severe C. Difficilis colitis # s/p total colectomy, and end ileostomy # Septic Shock # AKI Presents to the ED with diarrhea and generalized weakness and found to have C. difficile and underwent surgery on 04/02.  Weaned off pressors over the weekend.  Renal function normalized. -As per general surgery and critical care teams.  #Coronary artery disease s/p CABG # Ischemic Cardiomyopathy (EF 35% - 01/2024) # Hx of Atrial fibrillation s/p Watchman device (01/25) # Pulmonary Hypertension  Patient denies any chest pain or shortness of breath and states she has had no issues with swelling prior to her hospitalization. EKG in the ED and tele showed atrial fibrillation and this is not new for patient.  BNP 580.  Troponins trended flat 78 > 89, consistent with mismatch demand/supply in the setting of septic shock.  BP has been stable today and slightly tachycardic. - Recommend resuming home GDMT as BP stabilizes.  If remains  soft throughout admission, may consider resuming GDMT as outpatient. -No indication for diuretics at this time as patient appears euvolemic. -Patient on heparin drip, to transition to Eliquis today. -No plan for further cardiac diagnostics at this time.  Cardiology will sign off. Please haiku with questions or re-engage if needed.    This patient's plan of care was discussed and created with Dr. Melton Alar and she is in agreement.  Signed: Gale Journey, PA-C  03/04/2024, 8:52 AM [facility] Cardiology

## 2024-03-05 DIAGNOSIS — R6521 Severe sepsis with septic shock: Secondary | ICD-10-CM | POA: Diagnosis not present

## 2024-03-05 DIAGNOSIS — D500 Iron deficiency anemia secondary to blood loss (chronic): Secondary | ICD-10-CM

## 2024-03-05 DIAGNOSIS — Z932 Ileostomy status: Secondary | ICD-10-CM

## 2024-03-05 DIAGNOSIS — E8721 Acute metabolic acidosis: Secondary | ICD-10-CM

## 2024-03-05 DIAGNOSIS — E118 Type 2 diabetes mellitus with unspecified complications: Secondary | ICD-10-CM

## 2024-03-05 DIAGNOSIS — E039 Hypothyroidism, unspecified: Secondary | ICD-10-CM

## 2024-03-05 LAB — CBC
HCT: 25.2 % — ABNORMAL LOW (ref 36.0–46.0)
Hemoglobin: 8.2 g/dL — ABNORMAL LOW (ref 12.0–15.0)
MCH: 31.5 pg (ref 26.0–34.0)
MCHC: 32.5 g/dL (ref 30.0–36.0)
MCV: 96.9 fL (ref 80.0–100.0)
Platelets: 114 10*3/uL — ABNORMAL LOW (ref 150–400)
RBC: 2.6 MIL/uL — ABNORMAL LOW (ref 3.87–5.11)
RDW: 18.2 % — ABNORMAL HIGH (ref 11.5–15.5)
WBC: 14.2 10*3/uL — ABNORMAL HIGH (ref 4.0–10.5)
nRBC: 0.8 % — ABNORMAL HIGH (ref 0.0–0.2)

## 2024-03-05 LAB — GLUCOSE, CAPILLARY
Glucose-Capillary: 98 mg/dL (ref 70–99)
Glucose-Capillary: 98 mg/dL (ref 70–99)
Glucose-Capillary: 218 mg/dL — ABNORMAL HIGH (ref 70–99)
Glucose-Capillary: 180 mg/dL — ABNORMAL HIGH (ref 70–99)
Glucose-Capillary: 192 mg/dL — ABNORMAL HIGH (ref 70–99)

## 2024-03-05 LAB — RENAL FUNCTION PANEL
Albumin: 2.8 g/dL — ABNORMAL LOW (ref 3.5–5.0)
Anion gap: 7 (ref 5–15)
BUN: 31 mg/dL — ABNORMAL HIGH (ref 8–23)
CO2: 24 mmol/L (ref 22–32)
Calcium: 8.1 mg/dL — ABNORMAL LOW (ref 8.9–10.3)
Chloride: 102 mmol/L (ref 98–111)
Creatinine, Ser: 0.78 mg/dL (ref 0.44–1.00)
GFR, Estimated: >60 mL/min (ref >60)
Glucose, Bld: 117 mg/dL — ABNORMAL HIGH (ref 70–99)
Phosphorus: 2 mg/dL — ABNORMAL LOW (ref 2.5–4.6)
Potassium: 4.7 mmol/L (ref 3.5–5.1)
Sodium: 133 mmol/L — ABNORMAL LOW (ref 135–145)

## 2024-03-05 LAB — MAGNESIUM: Magnesium: 1.8 mg/dL (ref 1.7–2.4)

## 2024-03-05 MED ORDER — SODIUM PHOSPHATES 45 MMOLE/15ML IV SOLN
30.0000 mmol | Freq: Once | INTRAVENOUS | Status: AC
  Administered 2024-03-05: 30 mmol via INTRAVENOUS
  Filled 2024-03-05: qty 10

## 2024-03-05 MED ORDER — MIDODRINE HCL 5 MG PO TABS
10.0000 mg | ORAL_TABLET | Freq: Three times a day (TID) | ORAL | Status: DC
  Administered 2024-03-05 – 2024-03-12 (×20): 10 mg via ORAL
  Filled 2024-03-05 (×20): qty 2

## 2024-03-05 NOTE — Progress Notes (Signed)
 Inpatient Diabetes Program Recommendations  AACE/ADA: New Consensus Statement on Inpatient Glycemic Control (2015)  Target Ranges:  Prepandial:   less than 140 mg/dL      Peak postprandial:   less than 180 mg/dL (1-2 hours)      Critically ill patients:  140 - 180 mg/dL   Lab Results  Component Value Date   GLUCAP 98 03/05/2024   HGBA1C 5.9 (H) 02/28/2024    Review of Glycemic Control  Latest Reference Range & Units 03/04/24 07:39 03/04/24 11:57 03/04/24 16:29 03/04/24 19:23 03/04/24 23:31 03/05/24 03:59  Glucose-Capillary 70 - 99 mg/dL 098 (H) 119 (H) 147 (H) 320 (H) 172 (H) 98   Diabetes history: DM 2 Outpatient Diabetes medications:  Metformin 1000 mg bid Current orders for Inpatient glycemic control:  Novolog 0-20 units q 4 hours Semglee 20 units q HS Inpatient Diabetes Program Recommendations:    Consider reducing Novolog correction to moderate (0-15 units tid with meals and HS) and add Novolog 2 units tid with meals (hold if patient eats less than 50% or NPO).   Thanks,  Lorenza Cambridge, RN, BC-ADM Inpatient Diabetes Coordinator Pager [phone]  (8a-5p)

## 2024-03-05 NOTE — Progress Notes (Signed)
 Physical Therapy Treatment Patient Details Name: Tammy Arias MRN: [id number] DOB: March 06, 1956 Today's Date: 03/05/2024   History of Present Illness Patient is a 68 year old female  s/p exploratory laparotomy, total colectomy, and end ileostomy creation for septic shock secondary to C diff colitis. Vasopressor support required. History of CAD s/p CABG, heart failure    PT Comments  Patient progressing towards physical therapy goals. Patient semi reclined in bed on arrival and eager to participate with PT. Required minA for bed mobility and CGA for sit to stand. Ambulated ~21' with RW and CGA. Endorsing fatigue at end of ambulation with very slow gait speed. Discharge plan remains appropriate.     If plan is discharge home, recommend the following: A little help with walking and/or transfers;A little help with bathing/dressing/bathroom;Assist for transportation;Help with stairs or ramp for entrance   Can travel by private vehicle        Equipment Recommendations  None recommended by PT    Recommendations for Other Services       Precautions / Restrictions Precautions Precautions: Fall Recall of Precautions/Restrictions: Intact Precaution/Restrictions Comments: ostomy Restrictions Weight Bearing Restrictions Per Provider Order: No     Mobility  Bed Mobility Overal bed mobility: Needs Assistance Bed Mobility: Supine to Sit     Supine to sit: Min assist     General bed mobility comments: assistance for trunk    Transfers Overall transfer level: Needs assistance Equipment used: Rolling Yanett Conkright (2 wheels) Transfers: Sit to/from Stand Sit to Stand: Contact guard assist           General transfer comment: increased time to come into standing, cues for hand placement    Ambulation/Gait Ambulation/Gait assistance: Contact guard assist Gait Distance (Feet): 80 Feet Assistive device: Rolling Praveen Coia (2 wheels) Gait Pattern/deviations: Step-through pattern, Decreased  stride length Gait velocity: decreased     General Gait Details: CGA for safety. Good tolerance to activity this date. Expressing fatigue at end of ambulation   Stairs             Wheelchair Mobility     Tilt Bed    Modified Rankin (Stroke Patients Only)       Balance Overall balance assessment: Needs assistance Sitting-balance support: Feet supported Sitting balance-Leahy Scale: Fair     Standing balance support: Bilateral upper extremity supported, Reliant on assistive device for balance, During functional activity Standing balance-Leahy Scale: Poor                              Communication Communication Communication: No apparent difficulties  Cognition Arousal: Alert Behavior During Therapy: WFL for tasks assessed/performed   PT - Cognitive impairments: No apparent impairments                         Following commands: Intact      Cueing    Exercises      General Comments        Pertinent Vitals/Pain Pain Assessment Pain Assessment: No/denies pain    Home Living                          Prior Function            PT Goals (current goals can now be found in the care plan section) Acute Rehab PT Goals Patient Stated Goal: to go home PT Goal Formulation: With patient Time  For Goal Achievement: 03/18/24 Potential to Achieve Goals: Good Progress towards PT goals: Progressing toward goals    Frequency    Min 3X/week      PT Plan      Co-evaluation              AM-PAC PT "6 Clicks" Mobility   Outcome Measure  Help needed turning from your back to your side while in a flat bed without using bedrails?: A Little Help needed moving from lying on your back to sitting on the side of a flat bed without using bedrails?: A Little Help needed moving to and from a bed to a chair (including a wheelchair)?: A Little Help needed standing up from a chair using your arms (e.g., wheelchair or bedside  chair)?: A Little Help needed to walk in hospital room?: A Little Help needed climbing 3-5 steps with a railing? : A Little 6 Click Score: 18    End of Session   Activity Tolerance: Patient tolerated treatment well Patient left: in chair;with call bell/phone within reach Nurse Communication: Mobility status PT Visit Diagnosis: Muscle weakness (generalized) (M62.81);Unsteadiness on feet (R26.81)     Time: 6213-0865 PT Time Calculation (min) (ACUTE ONLY): 25 min  Charges:    $Therapeutic Activity: 23-37 mins PT General Charges $$ ACUTE PT VISIT: 1 Visit                     Maylon Peppers, PT, DPT Physical Therapist - [facility]  [facility]    Kaimen Peine A Hadasa Gasner 03/05/2024, 3:32 PM

## 2024-03-05 NOTE — TOC Progression Note (Signed)
 Transition of Care [facility]) - Progression Note    Patient Details  Name: Tammy Arias MRN: [id number] Date of Birth: 21-Apr-1956  Transition of Care [facility]) CM/SW Contact  Alesia Richards, RN 03/05/2024, 11:08 AM  Clinical Narrative:    CM to patient's room regarding home health PT/OT referral and DME recommendations. Patient's best friend, [name] at patient's bedside. Per patient patient, patient's best friend, [name] will provide transportation at discharge. CM and patient and patient's best friend, [name] discussed home health recommendations and 3 in 1 bedside commode recommendations. Per patient, does not recall name of previous home health agency that provided home health PT. Per patient requests for 3 in 1 bedside commode to be delivered to home address on file. CM call to [name] [facility], phone: [phone] regarding home health referral. Per [name], agency has accepted patient and will begin to follow. CM call to [name], phone: [phone] regarding 3 in 1 [facility] referral/order and home delivery. Per [name], will process order for home delivery.    Expected Discharge Plan: Home w Home Health Services Barriers to Discharge: Continued Medical Work up  Expected Discharge Plan and Services   Discharge Planning Services: CM Consult Post Acute Care Choice: Durable Medical Equipment, Home Health Living arrangements for the past 2 months: Apartment                 DME Arranged: 3-N-1 DME Agency: [employer] Date DME Agency Contacted: 03/05/24 Time DME Agency Contacted: 1100 Representative spoke with at DME Agency: [name] HH Arranged: PT, OT HH Agency: [facility] Date [facility] Agency Contacted: 03/05/24 Time HH Agency Contacted: 1059 Representative spoke with at [facility] Agency: [name]   Social Determinants of Health (SDOH) Interventions SDOH Screenings   Food Insecurity: No Food Insecurity (02/28/2024)  Recent Concern: Food Insecurity - Food  Insecurity Present (12/13/2023)   Received from [facility]  Housing: High Risk (02/28/2024)  Transportation Needs: No Transportation Needs (02/28/2024)  Utilities: Not At Risk (02/28/2024)  Financial Resource Strain: Low Risk  (02/11/2024)   Received from [facility]  Recent Concern: Financial Resource Strain - Medium Risk (12/13/2023)   Received from [facility]  Social Connections: Moderately Integrated (02/28/2024)  Tobacco Use: Medium Risk (02/28/2024)    Readmission Risk Interventions    03/01/2024   12:07 PM 02/29/2024   11:10 AM  Readmission Risk Prevention Plan  Transportation Screening Complete   PCP or Specialist Appt within 3-5 Days Complete Complete  HRI or Home Care Consult Complete Complete  Social Work Consult for Recovery Care Planning/Counseling Complete Complete  Palliative Care Screening Not Applicable Not Applicable  Medication Review Oceanographer) Complete

## 2024-03-05 NOTE — Consult Note (Signed)
 WOC Nurse ostomy follow up Placed patient on bedpan when I arrived, discarded 100 cc dark yellow urine, notified bedside nurse of stool and urine output for patients records.  Stoma type/location: RLQ, end ileostomy  Stomal assessment/size: 1 3/4, round, budded, moist, pink, os at center Peristomal assessment: intact; mild medical adhesive skin injury (MASRI) at the distal aspect of the tape border from 6-9 o'clock  Treatment options for stomal/peristomal skin: treated MARSI with no-sting skin barrier wipe Output liquid brown; 100CC emptied  Ostomy pouching: 1pc.soft convex with 2" barrier ring, most likely will not need convexity moving forward.  Education provided:  Explained role of ostomy nurse and creation of stoma  Explained stoma characteristics (budded, flush, color, texture, care) Demonstrated pouch change (cutting new skin barrier, measuring stoma, cleaning peristomal skin and stoma, use of barrier ring) Education on emptying when 1/3 to 1/2 full and how to empty Demonstrated use of wick to clean spout    Enrolled patient in [employer] Discharge program: Yes  Patient has CG that she will go to live with at DC for a period of time [name], I contacted her today while in the room with the patient and made arrangements to meet her for teaching session Thursday 4/10 at 9am in the patient's room  Educational materials left in the patients room with pattern for current pouch. 1 pouch in the room.   WOC Nurse will follow along with you for continued support with ostomy teaching and care Makelle [facility], RN, [place], CNS, [place] [phone]

## 2024-03-05 NOTE — Progress Notes (Signed)
 [facility] Progress Note  Patient: Tammy Arias    ZOX:[id number]  DOA: 02/27/2024     Date of Service: the patient was seen and examined on 03/05/2024  Chief Complaint  Patient presents with   Diarrhea   Brief hospital course: 68 yo F presenting to [facility] ED from home for evaluation of diarrhea and vomiting since 02/25/24.   History obtained per chart review and patient bedside report. Patient has had multiple hospitalizations since January 2025 with GI bleeding s/p clipping, watchman device placement and most recently acute CHF exacerbation from 01/26/24- 02/13/24.  During her most recent admission, she required loop diuretics as well as inotrope assistance for adequate diuresis. She was changed from entresto to valsartan due to hypotension and her BB was stopped. She was continued on Eliquis post watchman device per EP recommendations after 6 week TEE on 01/23/24 noted a small flow jet from the LAA back into the LA proper at the edge of the inferior aspect of the occluder device.  She also tested positive for C.Diff completing a 10 day course of PO vancomycin for treatment on 02/10/24.   After discharge she was in her normal state of health until noticing recurrent diarrhea on 02/17/24, which she thought might just be an IBS flare. Diarrhea however worsened on 02/21/24 which got much worse on 3/28 with the addition of persistent non-bloody bilious emesis. Her best friend, who has been helping care for her stated she noticed some blood in her stool that seemed to be from the patient's hemorrhoids. However this bleeding significantly increased on 02/27/24 and the patient has had at least 2 bloody bowel movements. The patient also endorses generalized weakness and fatigue, diffuse abdominal tenderness, blurred vision with green spots. She denies chest pain, dyspnea, urinary symptoms, fever/chills, LOC or palpitations. She did fall about 3 days ago, landing on her buttocks, she denies hitting her head.    She reports continuing to take all her prescribed medications including her valsartan, torsemide and Eliquis.   ED course: Upon arrival patient significantly hypotensive and drowsy. Sepsis protocol initiated and due to sever hypotension, central venous catheter placed and vasopressor started. Labs significant for hyponatremia, hypochloremia, AGMA, AKI on CKD, elevated Alk Phos, leukocytosis and significant lactic acidosis with fecal occult positive. Small amount of hematochezia noted in ED. Imaging significant for pan colitis without signs of toxic megacolon and cholelithiasis without cholecystis.  Medications given: cefepime/vancomycin/flagyl, levophed drip, 2.75 L IVF bolus Initial Vitals: 97.4, 15, 90, 62/35 & 100% on RA  C.Diff testing: Positive   CXR 02/27/24: no acute cardiopulmonary disease CT abdomen/pelvis wo contrast 02/27/24: Diffuse colonic wall thickening and surrounding inflammation compatible with pancolitis. Cholelithiasis.   PCCM consulted for admission due to severe sepsis with shock secondary to recurrent C.Diff colitis requiring vasopressor support.  02/27/24: Admit to ICU with severe sepsis and shock secondary to recurrent C.Diff colitis requiring vasopressor support. 02/28/24: on two pressors, rising lactic acidosis, surgery consulted, underwent colectomy 02/29/24: awake and follows commands off sedation, vasopressor requirements improved, extubated 03/01/24: remains weak but continues to improve, on low dose nor-epinephrine 03/02/24: pressor requirements improving, denies pain, tolerating sips 03/03/24: Vasopressor requirements continue to downtrend.  Tolerating clear liquids.  Patient was transferred to [facility] service on 03/04/2024  Assessment and Plan:  #Septic shock due to C. difficile colitis S/p vasopressor, gradually weaned off Monitor vitals 4/8 increased midodrine 10 mg p.o. 3 times daily    # C. 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 Monitor electrolytes and replete as needed.  # Hyperkalemia, mild, resolved after Lokelma 10 g one-time dose given Monitor electrolytes daily # Hyponatremia, monitor sodium level daily # Respiratory failure postop, required ventilator S/p intubation, successfully extubated.  Currently saturating well on room air.  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  Nutrition Problem: Inadequate oral intake Etiology: acute illness Interventions:  Diet: Soft diet DVT Prophylaxis: Therapeutic Anticoagulation with Eliquis    Advance goals of care discussion: Full code  Family Communication: family was not present at bedside, at the time of interview.  The pt provided permission to discuss medical plan with the family. Opportunity was given to ask question and all questions were answered satisfactorily.   Disposition:  Pt is from home, admitted with septic shock due to C. difficile colitis s/p ex lap, total colectomy and ileostomy, still has low BP on midodrine, advance diet to soft diet on 4/8, which precludes a safe discharge. Discharge to home, when stable and cleared by general surgery.  Subjective: No significant events overnight, still has abdominal pain off and  on, 3-4/10, ileostomy is working well, denied any worsening of shortness of breath, no chest pain or palpitation.  Diet is tolerating well, and has been advanced to soft diet.   Physical Exam: General: NAD, lying comfortably Appear in no distress, affect appropriate Eyes: PERRLA ENT: Oral Mucosa Clear, moist  Neck: no JVD,  Cardiovascular: S1 and S2 Present, no Murmur,  Respiratory: good respiratory effort, Bilateral Air entry equal and Decreased, no Crackles, no wheezes Abdomen: Bowel Sound present, Soft and mild postop tenderness, ileostomy intact Skin: no rashes Extremities: no Pedal edema, no calf tenderness Neurologic: without any new focal findings Gait not checked due to patient safety concerns  Vitals:   03/05/24 1300 03/05/24 1400 03/05/24 1500 03/05/24 1642  BP: 109/60 119/64 116/66 (!) 102/91  Pulse: 90 98 92 94  Resp: 17 17 15 18   Temp:    97.8 F (36.6 C)  TempSrc:    Oral  SpO2: 98% 98% 97% 100%  Weight:      Height:        Intake/Output Summary (Last 24 hours) at 03/05/2024 1646 Last data filed at 03/05/2024 1521 Gross per 24 hour  Intake 1005.99 ml  Output 1775 ml  Net -769.01 ml   Filed Weights   02/29/24 0308 03/03/24 0400 03/05/24 0430  Weight: 62.6 kg 63.9 kg 64.9 kg    Data Reviewed: I have personally reviewed and interpreted daily labs, tele strips, imagings as discussed above. I reviewed all nursing notes, pharmacy notes, vitals, pertinent old records I have discussed plan of care as described above with RN and patient/family.  CBC: Recent Labs  Lab 02/27/24 1901 02/28/24 8657 02/28/24 1528 02/28/24 1749 03/01/24 8469 03/01/24 0701 03/02/24 6295 03/02/24 1727 03/03/24 0004 03/03/24 0531 03/04/24 0345  03/05/24 0518  WBC 36.5*   < > 32.9*   < > 13.5*  --  12.1*  --   --  14.5* 12.6* 14.2*  NEUTROABS 33.3*  --  29.6*  --   --   --   --   --   --   --   --   --   HGB 12.6   < > 10.2*   < > 8.3*   < > 8.0* 7.7* 8.3* 8.6* 7.9* 8.2*  HCT  39.2   < > 30.6*   < > 23.9*   < > 23.5* 23.3* 25.4* 25.8* 24.5* 25.2*  MCV 96.3   < > 95.0   < > 90.9  --  95.5  --   --  93.8 97.2 96.9  PLT 279   < > 268   < > 115*  --  126*  --   --  143* 104* 114*   < > = values in this interval not displayed.   Basic Metabolic Panel: Recent Labs  Lab 03/01/24 0452 03/02/24 0610 03/03/24 0531 03/04/24 0345 03/04/24 0346 03/04/24 1600 03/05/24 0518  NA 130* 133* 137 135 135 133* 133*  K 3.6 3.2* 4.6 5.3* 5.3* 4.5 4.7  CL 90* 95* 101 103 102 102 102  CO2 25 28 29 26 26 23 24   GLUCOSE 148* 235* 101* 131* 132* 304* 117*  BUN 68* 58* 41* 35* 36* 35* 31*  CREATININE 3.82* 2.42* 1.25* 1.03* 0.98 0.93 0.78  CALCIUM 8.4* 8.5* 8.6* 8.4* 8.4* 7.9* 8.1*  MG 2.0 2.0 1.7 2.2  --   --  1.8  PHOS 4.4 3.3 1.3*  --  <1.0* 1.9* 2.0*    Studies: No results found.  Scheduled Meds:  apixaban  5 mg Oral BID   Chlorhexidine Gluconate Cloth  6 each Topical Daily   feeding supplement (GLUCERNA SHAKE)  237 mL Oral TID BM   insulin aspart  0-20 Units Subcutaneous Q4H   insulin glargine-yfgn  20 Units Subcutaneous Q2200   latanoprost  1 drop Both Eyes QHS   levothyroxine  50 mcg Oral Q0600   lidocaine  1 patch Transdermal Q0600   midodrine  5 mg Oral TID WC   multivitamin with minerals  1 tablet Oral Daily   pantoprazole  40 mg Oral Daily   thiamine  100 mg Oral Daily   Continuous Infusions: PRN Meds: acetaminophen, albuterol, morphine injection, mouth rinse, oxyCODONE  Time spent: 40 minutes  Author: Gillis Santa. MD [facility] 03/05/2024 4:46 PM  To reach On-call, see care teams to locate the attending and reach out to them via [email or web address]. If 7PM-7AM, please contact night-coverage If you still have difficulty reaching the attending provider, please page the [facility] (Director on Call) for [facility] on amion for assistance.

## 2024-03-05 NOTE — Progress Notes (Signed)
 [facility] SURGICAL PROGRESS NOTE  Hospital Day(s): 7.   Post op day(s): 6 Days Post-Op.   Interval History:  Patient seen and examined No acute events or new complaints overnight.  She is sitting up in bed; much brighter spirits  Reports abdomen is sore; comes and goes in waves No fever, chills, nausea, emesis  Leukocytosis fluctuates; WBC 14.2K Hgb to 8.2 Renal function improving; sCr - 0.78; UO - 1025 ccs + unmeasured Ileostomy; 650 ccs out; loose FLD; tolerating  Vital signs in last 24 hours: [min-max] current  Temp:  [97.7 F (36.5 C)-98.9 F (37.2 C)] 98.3 F (36.8 C) (04/08 0400) Pulse Rate:  [78-107] 97 (04/08 0600) Resp:  [13-24] 14 (04/08 0600) BP: (91-116)/(46-71) 108/59 (04/08 0600) SpO2:  [97 %-100 %] 97 % (04/08 0600) Weight:  [64.9 kg] 64.9 kg (04/08 0430)     Height: 5\' 1"  (154.9 cm) Weight: 64.9 kg (bed technical error) BMI (Calculated): 27.05   Intake/Output last 2 shifts:  04/07 0701 - 04/08 0700 In: 798 [P.O.:540; IV Piggyback:258] Out: 1675 [Urine:1025; Stool:650]   Physical Exam:  Constitutional: Sitting up in chair, NAD Respiratory: On [place], no respiratory distress Cardiovascular: tachycardic to 100 bpm and irregular Gastrointestinal: soft, expected tenderness, non-distended, no rebound/guarding. Ileostomy in RLQ; loose stool in bag  Integumentary: Laparotomy is CDI with staples, no erythema or drainage   Labs:     Latest Ref Rng & Units 03/05/2024    5:18 AM 03/04/2024    3:45 AM 03/03/2024    5:31 AM  CBC  WBC 4.0 - 10.5 K/uL 14.2  12.6  14.5   Hemoglobin 12.0 - 15.0 g/dL 8.2  7.9  8.6   Hematocrit 36.0 - 46.0 % 25.2  24.5  25.8   Platelets 150 - 400 K/uL 114  104  143       Latest Ref Rng & Units 03/05/2024    5:18 AM 03/04/2024    4:00 PM 03/04/2024    3:46 AM  CMP  Glucose 70 - 99 mg/dL 960  454  098   BUN 8 - 23 mg/dL 31  35  36   Creatinine 0.44 - 1.00 mg/dL 1.19  1.47  8.29   Sodium 135 - 145 mmol/L 133  133  135    Potassium 3.5 - 5.1 mmol/L 4.7  4.5  5.3   Chloride 98 - 111 mmol/L 102  102  102   CO2 22 - 32 mmol/L 24  23  26    Calcium 8.9 - 10.3 mg/dL 8.1  7.9  8.4     Imaging studies: No new pertinent imaging studies   Assessment/Plan:  68 y.o. female 6 Days Post-Op s/p exploratory laparotomy, total colectomy, and end ileostomy creation for septic shock secondary to C diff colitis   - Okay to do soft diet for lunch - Monitor abdominal examination - Monitor ileostomy output; as she recovers, will likely need to start antidiarrheal to prevent dehydration - we can hold for now   - Pain control prn; okay to resume PO medications  - WOC RN for ostomy care/teaching   - Monitor leukocytosis; fluctuating   - Working with therapies  - Further management per primary service; we will follow   All of the above findings and recommendations were discussed with the patient and the medical team.  -- Lynden Oxford, PA-C [facility] 03/05/2024, 7:27 AM M-F: 7am - 4pm

## 2024-03-05 NOTE — Plan of Care (Signed)
  Problem: Education: Goal: Knowledge of General Education information will improve Description: Including pain rating scale, medication(s)/side effects and non-pharmacologic comfort measures Outcome: Progressing   Problem: Health Behavior/Discharge Planning: Goal: Ability to manage health-related needs will improve Outcome: Progressing   Problem: Clinical Measurements: Goal: Will remain free from infection Outcome: Progressing Goal: Diagnostic test results will improve Outcome: Progressing Goal: Cardiovascular complication will be avoided Outcome: Progressing   Problem: Activity: Goal: Risk for activity intolerance will decrease Outcome: Progressing   Problem: Nutrition: Goal: Adequate nutrition will be maintained Outcome: Progressing   Problem: Coping: Goal: Level of anxiety will decrease Outcome: Progressing   Problem: Elimination: Goal: Will not experience complications related to bowel motility Outcome: Progressing   Problem: Skin Integrity: Goal: Risk for impaired skin integrity will decrease Outcome: Progressing   Problem: Coping: Goal: Ability to adjust to condition or change in health will improve Outcome: Progressing   Problem: Fluid Volume: Goal: Ability to maintain a balanced intake and output will improve Outcome: Progressing

## 2024-03-06 DIAGNOSIS — R6521 Severe sepsis with septic shock: Secondary | ICD-10-CM | POA: Diagnosis not present

## 2024-03-06 LAB — RENAL FUNCTION PANEL
Albumin: 2.9 g/dL — ABNORMAL LOW (ref 3.5–5.0)
Anion gap: 6 (ref 5–15)
BUN: 29 mg/dL — ABNORMAL HIGH (ref 8–23)
CO2: 25 mmol/L (ref 22–32)
Calcium: 8.4 mg/dL — ABNORMAL LOW (ref 8.9–10.3)
Chloride: 101 mmol/L (ref 98–111)
Creatinine, Ser: 0.75 mg/dL (ref 0.44–1.00)
GFR, Estimated: >60 mL/min (ref >60)
Glucose, Bld: 117 mg/dL — ABNORMAL HIGH (ref 70–99)
Phosphorus: 2.6 mg/dL (ref 2.5–4.6)
Potassium: 4.9 mmol/L (ref 3.5–5.1)
Sodium: 132 mmol/L — ABNORMAL LOW (ref 135–145)

## 2024-03-06 LAB — CBC
HCT: 26.2 % — ABNORMAL LOW (ref 36.0–46.0)
Hemoglobin: 8.6 g/dL — ABNORMAL LOW (ref 12.0–15.0)
MCH: 31.6 pg (ref 26.0–34.0)
MCHC: 32.8 g/dL (ref 30.0–36.0)
MCV: 96.3 fL (ref 80.0–100.0)
Platelets: 136 10*3/uL — ABNORMAL LOW (ref 150–400)
RBC: 2.72 MIL/uL — ABNORMAL LOW (ref 3.87–5.11)
RDW: 19.7 % — ABNORMAL HIGH (ref 11.5–15.5)
WBC: 13.7 10*3/uL — ABNORMAL HIGH (ref 4.0–10.5)
nRBC: 0.5 % — ABNORMAL HIGH (ref 0.0–0.2)

## 2024-03-06 LAB — GLUCOSE, CAPILLARY
Glucose-Capillary: 101 mg/dL — ABNORMAL HIGH (ref 70–99)
Glucose-Capillary: 115 mg/dL — ABNORMAL HIGH (ref 70–99)
Glucose-Capillary: 162 mg/dL — ABNORMAL HIGH (ref 70–99)
Glucose-Capillary: 183 mg/dL — ABNORMAL HIGH (ref 70–99)
Glucose-Capillary: 212 mg/dL — ABNORMAL HIGH (ref 70–99)
Glucose-Capillary: 226 mg/dL — ABNORMAL HIGH (ref 70–99)

## 2024-03-06 LAB — MAGNESIUM: Magnesium: 1.9 mg/dL (ref 1.7–2.4)

## 2024-03-06 NOTE — Progress Notes (Signed)
 Left copy of IMM with nurse at nurse station, patient is Enteric precautions patient.

## 2024-03-06 NOTE — Progress Notes (Signed)
 Mobility Specialist - Progress Note   03/06/24 1454  Mobility  Activity Ambulated with assistance in hallway  Level of Assistance Standby assist, set-up cues, supervision of patient - no hands on  Assistive Device Front wheel walker  Distance Ambulated (ft) 160 ft  Activity Response Tolerated well  Mobility visit 1 Mobility  Mobility Specialist Start Time (ACUTE ONLY) 1418  Mobility Specialist Stop Time (ACUTE ONLY) 1429  Mobility Specialist Time Calculation (min) (ACUTE ONLY) 11 min   Pt sitting in the recliner upon entry, utilizing RA. Pt motivated and agreeable to OOB amb this date. Pt STS to RW and amb one lap around the NS with supervision. Pt endorsed 4/10 pain during amb, RN notified. Pt returned to the room, left supine with alarm set and needs within reach.  Zetta Bills Mobility Specialist 03/06/24 2:57 PM

## 2024-03-06 NOTE — Progress Notes (Signed)
 [facility] Progress Note  Patient: Tammy Arias    XLK:[id number]  DOA: 02/27/2024     Date of Service: the patient was seen and examined on 03/06/2024  Chief Complaint  Patient presents with   Diarrhea   Brief hospital course: 68 yo F presenting to [facility] ED from home for evaluation of diarrhea and vomiting since 02/25/24.   History obtained per chart review and patient bedside report. Patient has had multiple hospitalizations since January 2025 with GI bleeding s/p clipping, watchman device placement and most recently acute CHF exacerbation from 01/26/24- 02/13/24.  During her most recent admission, she required loop diuretics as well as inotrope assistance for adequate diuresis. She was changed from entresto to valsartan due to hypotension and her BB was stopped. She was continued on Eliquis post watchman device per EP recommendations after 6 week TEE on 01/23/24 noted a small flow jet from the LAA back into the LA proper at the edge of the inferior aspect of the occluder device.  She also tested positive for C.Diff completing a 10 day course of PO vancomycin for treatment on 02/10/24.   After discharge she was in her normal state of health until noticing recurrent diarrhea on 02/17/24, which she thought might just be an IBS flare. Diarrhea however worsened on 02/21/24 which got much worse on 3/28 with the addition of persistent non-bloody bilious emesis. Her best friend, who has been helping care for her stated she noticed some blood in her stool that seemed to be from the patient's hemorrhoids. However this bleeding significantly increased on 02/27/24 and the patient has had at least 2 bloody bowel movements. The patient also endorses generalized weakness and fatigue, diffuse abdominal tenderness, blurred vision with green spots. She denies chest pain, dyspnea, urinary symptoms, fever/chills, LOC or palpitations. She did fall about 3 days ago, landing on her buttocks, she denies hitting her head.    She reports continuing to take all her prescribed medications including her valsartan, torsemide and Eliquis.   ED course: Upon arrival patient significantly hypotensive and drowsy. Sepsis protocol initiated and due to sever hypotension, central venous catheter placed and vasopressor started. Labs significant for hyponatremia, hypochloremia, AGMA, AKI on CKD, elevated Alk Phos, leukocytosis and significant lactic acidosis with fecal occult positive. Small amount of hematochezia noted in ED. Imaging significant for pan colitis without signs of toxic megacolon and cholelithiasis without cholecystis.  Medications given: cefepime/vancomycin/flagyl, levophed drip, 2.75 L IVF bolus Initial Vitals: 97.4, 15, 90, 62/35 & 100% on RA  C.Diff testing: Positive   CXR 02/27/24: no acute cardiopulmonary disease CT abdomen/pelvis wo contrast 02/27/24: Diffuse colonic wall thickening and surrounding inflammation compatible with pancolitis. Cholelithiasis.   PCCM consulted for admission due to severe sepsis with shock secondary to recurrent C.Diff colitis requiring vasopressor support.  02/27/24: Admit to ICU with severe sepsis and shock secondary to recurrent C.Diff colitis requiring vasopressor support. 02/28/24: on two pressors, rising lactic acidosis, surgery consulted, underwent colectomy 02/29/24: awake and follows commands off sedation, vasopressor requirements improved, extubated 03/01/24: remains weak but continues to improve, on low dose nor-epinephrine 03/02/24: pressor requirements improving, denies pain, tolerating sips 03/03/24: Vasopressor requirements continue to downtrend.  Tolerating clear liquids.  Patient was transferred to [facility] service on 03/04/2024  Assessment and Plan:  #Septic shock due to C. difficile colitis S/p vasopressor, gradually weaned off Monitor vitals 4/8 increased midodrine 10 mg p.o. 3 times daily    # C. 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 Staples can be removed on POD 14 (04/16)  S/p antibiotics Started diet, tolerating well  # Hypophosphatemia due to nutritional deficiency Phos repleted. Monitor electrolytes and replete as needed.  # Hyperkalemia, mild, resolved after Lokelma 10 g one-time dose given Monitor electrolytes daily # Hyponatremia, monitor sodium level daily # Respiratory failure postop, required ventilator S/p intubation, successfully extubated.  Currently saturating well on room air.  # AKI and metabolic acidosis Cr 4.17--->0.78, AKI resolved  #Heart failure with reduced ejection fraction #Atrial fibrillation  #s/p Watchman procedure (11/2023) #CAD status post CABG S/p heparin IV infusion, transition to Eliquis Hold GDMT for now due to hypotension, resume when BP stable 4/7 Lasix 40 mg one-time dose given due to shortness of breath 4/9 follow 2D echocardiogram to eval EF ?  # Acute blood loss anemia Monitor H&H  # IDDM T2 Hyperglycemia Started Semglee 20 units nightly Continue with NovoLog sliding scale, monitor CBG, continue diabetic diet  # Hypothyroid, continue Synthroid    Body mass index is 27.03 kg/m.  Nutrition Problem: Inadequate oral intake Etiology: acute illness Interventions:  Diet: Soft diet DVT Prophylaxis: Therapeutic Anticoagulation with Eliquis    Advance goals of care discussion: Full code  Family Communication: family was not present at bedside, at the time of interview.  The pt provided permission to discuss medical plan with the family. Opportunity was given to ask question and all questions were answered satisfactorily.   Disposition:  Pt is from home, admitted with septic shock due to C. difficile colitis s/p ex lap, total colectomy and ileostomy, still has low BP on midodrine, advance diet to soft diet on 4/8, which  precludes a safe discharge. Discharge to home, when stable, still has low blood pressure on midodrine.  Ostomy will be changed yesterday and patient will receive instructions. Most likely discharge tomorrow a.m. if remains stable.   Subjective: No significant events overnight, feeling mild shortness of breath, abdominal pain 3/10, ileostomy working well, denied any other complaints.   Physical Exam: General: NAD, lying comfortably Appear in no distress, affect appropriate Eyes: PERRLA ENT: Oral Mucosa Clear, moist  Neck: no JVD,  Cardiovascular: S1 and S2 Present, no Murmur,  Respiratory: good respiratory effort, Bilateral Air entry equal and Decreased, no Crackles, no wheezes Abdomen: Bowel Sound present, Soft and mild postop tenderness, ileostomy intact Skin: no rashes Extremities: no Pedal edema, no calf tenderness Neurologic: without any new focal findings Gait not checked due to patient safety concerns  Vitals:   03/05/24 1642 03/05/24 2100 03/06/24 0356 03/06/24 0818  BP: (!) 102/91 108/84 (!) 108/57 97/61  Pulse: 94 90 92 96  Resp: 18 16 16 18   Temp: 97.8 F (36.6 C) 98.7 F (37.1 C) 97.7 F (36.5 C) 97.8 F (36.6 C)  TempSrc: Oral     SpO2: 100% 100% 100% 98%  Weight:      Height:        Intake/Output Summary (Last 24 hours) at 03/06/2024 1514 Last data filed at 03/06/2024 1051 Gross per 24 hour  Intake 208.03 ml  Output 250 ml  Net -41.97 ml   Filed Weights   02/29/24 0308 03/03/24 0400 03/05/24 0430  Weight: 62.6 kg 63.9 kg 64.9 kg    Data Reviewed: I have personally reviewed and interpreted daily labs, tele strips, imagings as discussed above. I reviewed all nursing notes, pharmacy notes, vitals, pertinent old records  I have discussed plan of care as described above with RN and patient/family.  CBC: Recent Labs  Lab 02/28/24 1528 02/28/24 1749 03/02/24 0610 03/02/24 1727 03/03/24 0004 03/03/24 0531 03/04/24 0345 03/05/24 0518 03/06/24 0407   WBC 32.9*   < > 12.1*  --   --  14.5* 12.6* 14.2* 13.7*  NEUTROABS 29.6*  --   --   --   --   --   --   --   --   HGB 10.2*   < > 8.0*   < > 8.3* 8.6* 7.9* 8.2* 8.6*  HCT 30.6*   < > 23.5*   < > 25.4* 25.8* 24.5* 25.2* 26.2*  MCV 95.0   < > 95.5  --   --  93.8 97.2 96.9 96.3  PLT 268   < > 126*  --   --  143* 104* 114* 136*   < > = values in this interval not displayed.   Basic Metabolic Panel: Recent Labs  Lab 03/02/24 0610 03/03/24 0531 03/04/24 0345 03/04/24 0346 03/04/24 1600 03/05/24 0518 03/06/24 0407  NA 133* 137 135 135 133* 133* 132*  K 3.2* 4.6 5.3* 5.3* 4.5 4.7 4.9  CL 95* 101 103 102 102 102 101  CO2 28 29 26 26 23 24 25   GLUCOSE 235* 101* 131* 132* 304* 117* 117*  BUN 58* 41* 35* 36* 35* 31* 29*  CREATININE 2.42* 1.25* 1.03* 0.98 0.93 0.78 0.75  CALCIUM 8.5* 8.6* 8.4* 8.4* 7.9* 8.1* 8.4*  MG 2.0 1.7 2.2  --   --  1.8 1.9  PHOS 3.3 1.3*  --  <1.0* 1.9* 2.0* 2.6    Studies: No results found.  Scheduled Meds:  apixaban  5 mg Oral BID   Chlorhexidine Gluconate Cloth  6 each Topical Daily   feeding supplement (GLUCERNA SHAKE)  237 mL Oral TID BM   insulin aspart  0-20 Units Subcutaneous Q4H   insulin glargine-yfgn  20 Units Subcutaneous Q2200   latanoprost  1 drop Both Eyes QHS   levothyroxine  50 mcg Oral Q0600   lidocaine  1 patch Transdermal Q0600   midodrine  10 mg Oral TID WC   multivitamin with minerals  1 tablet Oral Daily   pantoprazole  40 mg Oral Daily   thiamine  100 mg Oral Daily   Continuous Infusions: PRN Meds: acetaminophen, albuterol, morphine injection, mouth rinse, oxyCODONE  Time spent: 40 minutes  Author: Gillis Santa. MD [facility] 03/06/2024 3:14 PM  To reach On-call, see care teams to locate the attending and reach out to them via [email or web address]. If 7PM-7AM, please contact night-coverage If you still have difficulty reaching the attending provider, please page the [facility] (Director on Call) for [facility] on amion for  assistance.

## 2024-03-06 NOTE — Progress Notes (Signed)
 Physical Therapy Treatment Patient Details Name: Tammy Arias MRN: [id number] DOB: [birth year] Today's Date: 03/06/2024   History of Present Illness Patient is a 68 year old female  s/p exploratory laparotomy, total colectomy, and end ileostomy creation for septic shock secondary to C diff colitis. Vasopressor support required. History of CAD s/p CABG, heart failure    PT Comments  Patient alert, agreeable to PT, seated in recliner at start/end of session. She was able to transfer with supervision, use of RW. She ambulated ~248ft with RW and supervision as well. Steady, safe, decreased velocity, but pt did report fatigue at end of session. Due to pt progress, pt frequency adjusted to reflect her good progress towards goals. She would benefit from continued skilled PT to maximize function and return to PLOF.    If plan is discharge home, recommend the following: A little help with walking and/or transfers;A little help with bathing/dressing/bathroom;Assist for transportation;Help with stairs or ramp for entrance   Can travel by private vehicle        Equipment Recommendations  None recommended by PT    Recommendations for Other Services       Precautions / Restrictions Precautions Precautions: Fall Recall of Precautions/Restrictions: Intact Precaution/Restrictions Comments: ostomy     Mobility  Bed Mobility               General bed mobility comments: pt up in recliner at start/end of session    Transfers Overall transfer level: Needs assistance Equipment used: Rolling walker (2 wheels) Transfers: Sit to/from Stand Sit to Stand: Supervision                Ambulation/Gait Ambulation/Gait assistance: Contact guard assist, Supervision Gait Distance (Feet): 220 Feet Assistive device: Rolling walker (2 wheels)         General Gait Details: decreased velocity but safe, steady, no LOB. pt did report fatigue at end of activity   Stairs              Wheelchair Mobility     Tilt Bed    Modified Rankin (Stroke Patients Only)       Balance Overall balance assessment: Needs assistance Sitting-balance support: Feet supported Sitting balance-Leahy Scale: Fair     Standing balance support: Bilateral upper extremity supported, Reliant on assistive device for balance, During functional activity Standing balance-Leahy Scale: Fair                              Communication    Cognition Arousal: Alert Behavior During Therapy: WFL for tasks assessed/performed   PT - Cognitive impairments: No apparent impairments                         Following commands: Intact      Cueing    Exercises      General Comments        Pertinent Vitals/Pain Pain Assessment Pain Assessment: No/denies pain    Home Living                          Prior Function            PT Goals (current goals can now be found in the care plan section) Progress towards PT goals: Progressing toward goals    Frequency    Min 2X/week      PT Plan      Co-evaluation  AM-PAC PT "6 Clicks" Mobility   Outcome Measure  Help needed turning from your back to your side while in a flat bed without using bedrails?: A Little Help needed moving from lying on your back to sitting on the side of a flat bed without using bedrails?: A Little Help needed moving to and from a bed to a chair (including a wheelchair)?: A Little Help needed standing up from a chair using your arms (e.g., wheelchair or bedside chair)?: A Little Help needed to walk in hospital room?: A Little Help needed climbing 3-5 steps with a railing? : A Little 6 Click Score: 18    End of Session   Activity Tolerance: Patient tolerated treatment well Patient left: in chair;with call bell/phone within reach Nurse Communication: Mobility status PT Visit Diagnosis: Muscle weakness (generalized) (M62.81);Unsteadiness on feet (R26.81)      Time: 6045-4098 PT Time Calculation (min) (ACUTE ONLY): 12 min  Charges:    $Therapeutic Activity: 8-22 mins PT General Charges $$ ACUTE PT VISIT: 1 Visit                     Olga Coaster PT, DPT 1:36 PM,03/06/24

## 2024-03-06 NOTE — Plan of Care (Signed)
?  Problem: Clinical Measurements: ?Goal: Will remain free from infection ?Outcome: Progressing ?Goal: Diagnostic test results will improve ?Outcome: Progressing ?Goal: Cardiovascular complication will be avoided ?Outcome: Progressing ?  ?Problem: Activity: ?Goal: Risk for activity intolerance will decrease ?Outcome: Progressing ?  ?Problem: Nutrition: ?Goal: Adequate nutrition will be maintained ?Outcome: Progressing ?  ?

## 2024-03-06 NOTE — Progress Notes (Signed)
 [facility] SURGICAL PROGRESS NOTE  Hospital Day(s): 8.   Post op day(s): 7 Days Post-Op.   Interval History:  Patient seen and examined No acute events or new complaints overnight.  Now on floor She is doing well  Reports abdomen is sore; comes and goes in waves No fever, chills, nausea, emesis  Leukocytosis fluctuates; WBC 13.7K Hgb to 8.6 Renal function improving; sCr - 0.75; UO - 350 ccs + unmeasured Ileostomy; 200 ccs out; loose Soft diet; tolerating  Vital signs in last 24 hours: [min-max] current  Temp:  [97.7 F (36.5 C)-98.7 F (37.1 C)] 97.7 F (36.5 C) (04/09 0356) Pulse Rate:  [90-104] 92 (04/09 0356) Resp:  [13-23] 16 (04/09 0356) BP: (101-121)/(53-91) 108/57 (04/09 0356) SpO2:  [97 %-100 %] 100 % (04/09 0356)     Height: 5\' 1"  (154.9 cm) Weight: 64.9 kg (bed technical error) BMI (Calculated): 27.05   Intake/Output last 2 shifts:  04/08 0701 - 04/09 0700 In: 448 [P.O.:240; IV Piggyback:208] Out: 550 [Urine:350; Stool:200]   Physical Exam:  Constitutional: Sitting up in chair, NAD Respiratory: breathing non-labored, no respiratory distress Gastrointestinal: soft, expected tenderness, non-distended, no rebound/guarding. Ileostomy in RLQ; loose stool in bag  Integumentary: Laparotomy is CDI with staples, no erythema or drainage   Labs:     Latest Ref Rng & Units 03/06/2024    4:07 AM 03/05/2024    5:18 AM 03/04/2024    3:45 AM  CBC  WBC 4.0 - 10.5 K/uL 13.7  14.2  12.6   Hemoglobin 12.0 - 15.0 g/dL 8.6  8.2  7.9   Hematocrit 36.0 - 46.0 % 26.2  25.2  24.5   Platelets 150 - 400 K/uL 136  114  104       Latest Ref Rng & Units 03/06/2024    4:07 AM 03/05/2024    5:18 AM 03/04/2024    4:00 PM  CMP  Glucose 70 - 99 mg/dL 161  096  045   BUN 8 - 23 mg/dL 29  31  35   Creatinine 0.44 - 1.00 mg/dL 4.09  8.11  9.14   Sodium 135 - 145 mmol/L 132  133  133   Potassium 3.5 - 5.1 mmol/L 4.9  4.7  4.5   Chloride 98 - 111 mmol/L 101  102  102   CO2 22  - 32 mmol/L 25  24  23    Calcium 8.9 - 10.3 mg/dL 8.4  8.1  7.9     Imaging studies: No new pertinent imaging studies   Assessment/Plan:  68 y.o. female 7 Days Post-Op s/p exploratory laparotomy, total colectomy, and end ileostomy creation for septic shock secondary to C diff colitis   - Continue diet as tolerated  - Monitor abdominal examination - Monitor ileostomy output; as she recovers, will likely need to start antidiarrheal to prevent dehydration - we can hold for now   - Pain control prn; okay to resume PO medications  - WOC RN for ostomy care/teaching   - Monitor leukocytosis; fluctuating   - Working with therapies  - Further management per primary service   - Discharge Planning: From a surgical perspective, she is doing well with ROBF and tolerating diet. Nothing further to add at this time from surgical perspective. Need to closely monitor ileostomy output and quantify. Staples can be removed on POD 14 (04/16). We will follow peripherally and intermittently for now. Please call with changes/concerns.   All of the above findings and recommendations were discussed  with the patient and the medical team.  -- Lynden Oxford, PA-C [facility] 03/06/2024, 7:17 AM M-F: 7am - 4pm

## 2024-03-07 ENCOUNTER — Other Ambulatory Visit: Payer: Self-pay | Admitting: Surgery

## 2024-03-07 ENCOUNTER — Inpatient Hospital Stay
Admit: 2024-03-07 | Discharge: 2024-03-07 | Disposition: A | Discharge status: Home / Self Care | Attending: Student | Admitting: Student

## 2024-03-07 DIAGNOSIS — E1169 Type 2 diabetes mellitus with other specified complication: Secondary | ICD-10-CM

## 2024-03-07 DIAGNOSIS — R6521 Severe sepsis with septic shock: Secondary | ICD-10-CM | POA: Diagnosis not present

## 2024-03-07 DIAGNOSIS — Z932 Ileostomy status: Secondary | ICD-10-CM

## 2024-03-07 DIAGNOSIS — D62 Acute posthemorrhagic anemia: Secondary | ICD-10-CM

## 2024-03-07 LAB — GLUCOSE, CAPILLARY
Glucose-Capillary: 109 mg/dL — ABNORMAL HIGH (ref 70–99)
Glucose-Capillary: 109 mg/dL — ABNORMAL HIGH (ref 70–99)
Glucose-Capillary: 110 mg/dL — ABNORMAL HIGH (ref 70–99)
Glucose-Capillary: 205 mg/dL — ABNORMAL HIGH (ref 70–99)
Glucose-Capillary: 129 mg/dL — ABNORMAL HIGH (ref 70–99)
Glucose-Capillary: 227 mg/dL — ABNORMAL HIGH (ref 70–99)

## 2024-03-07 LAB — CBC
HCT: 24.7 % — ABNORMAL LOW (ref 36.0–46.0)
Hemoglobin: 8.3 g/dL — ABNORMAL LOW (ref 12.0–15.0)
MCH: 32.3 pg (ref 26.0–34.0)
MCHC: 33.6 g/dL (ref 30.0–36.0)
MCV: 96.1 fL (ref 80.0–100.0)
Platelets: 114 10*3/uL — ABNORMAL LOW (ref 150–400)
RBC: 2.57 MIL/uL — ABNORMAL LOW (ref 3.87–5.11)
RDW: 20.5 % — ABNORMAL HIGH (ref 11.5–15.5)
WBC: 10.4 10*3/uL (ref 4.0–10.5)
nRBC: 0.2 % (ref 0.0–0.2)

## 2024-03-07 LAB — BASIC METABOLIC PANEL WITH GFR
Anion gap: 6 (ref 5–15)
BUN: 26 mg/dL — ABNORMAL HIGH (ref 8–23)
CO2: 23 mmol/L (ref 22–32)
Calcium: 8.7 mg/dL — ABNORMAL LOW (ref 8.9–10.3)
Chloride: 101 mmol/L (ref 98–111)
Creatinine, Ser: 0.81 mg/dL (ref 0.44–1.00)
GFR, Estimated: >60 mL/min (ref >60)
Glucose, Bld: 110 mg/dL — ABNORMAL HIGH (ref 70–99)
Potassium: 5.3 mmol/L — ABNORMAL HIGH (ref 3.5–5.1)
Sodium: 130 mmol/L — ABNORMAL LOW (ref 135–145)

## 2024-03-07 LAB — ECHOCARDIOGRAM COMPLETE
AR max vel: 1.54 cm2
AV Area VTI: 1.67 cm2
AV Area mean vel: 1.64 cm2
AV Mean grad: 3.3 mmHg
AV Peak grad: 5.6 mmHg
Ao pk vel: 1.19 m/s
Area-P 1/2: 4.36 cm2
Height: 61 in
S' Lateral: 4.5 cm
Weight: 2289.26 [oz_av]

## 2024-03-07 LAB — MAGNESIUM: Magnesium: 1.7 mg/dL (ref 1.7–2.4)

## 2024-03-07 LAB — BRAIN NATRIURETIC PEPTIDE: B Natriuretic Peptide: 936.6 pg/mL — ABNORMAL HIGH (ref 0.0–100.0)

## 2024-03-07 LAB — PHOSPHORUS: Phosphorus: 2.9 mg/dL (ref 2.5–4.6)

## 2024-03-07 MED ORDER — FUROSEMIDE 10 MG/ML IJ SOLN
20.0000 mg | Freq: Four times a day (QID) | INTRAMUSCULAR | Status: DC
  Administered 2024-03-07 – 2024-03-08 (×4): 20 mg via INTRAVENOUS
  Filled 2024-03-07 (×4): qty 4

## 2024-03-07 MED ORDER — GERHARDT'S BUTT CREAM
TOPICAL_CREAM | Freq: Two times a day (BID) | CUTANEOUS | Status: DC
Start: 2024-03-07 — End: 2024-03-12
  Filled 2024-03-07: qty 60

## 2024-03-07 NOTE — Progress Notes (Signed)
 [facility] Progress Note  Patient: Tammy Arias    WRU:[id number]  DOA: 02/27/2024     Date of Service: the patient was seen and examined on 03/07/2024  Chief Complaint  Patient presents with   Diarrhea   Brief hospital course: 68 yo F presenting to [facility] ED from home for evaluation of diarrhea and vomiting since 02/25/24.   History obtained per chart review and patient bedside report. Patient has had multiple hospitalizations since January 2025 with GI bleeding s/p clipping, watchman device placement and most recently acute CHF exacerbation from 01/26/24- 02/13/24.  During her most recent admission, she required loop diuretics as well as inotrope assistance for adequate diuresis. She was changed from entresto to valsartan due to hypotension and her BB was stopped. She was continued on Eliquis post watchman device per EP recommendations after 6 week TEE on 01/23/24 noted a small flow jet from the LAA back into the LA proper at the edge of the inferior aspect of the occluder device.  She also tested positive for C.Diff completing a 10 day course of PO vancomycin for treatment on 02/10/24.   After discharge she was in her normal state of health until noticing recurrent diarrhea on 02/17/24, which she thought might just be an IBS flare. Diarrhea however worsened on 02/21/24 which got much worse on 3/28 with the addition of persistent non-bloody bilious emesis. Her best friend, who has been helping care for her stated she noticed some blood in her stool that seemed to be from the patient's hemorrhoids. However this bleeding significantly increased on 02/27/24 and the patient has had at least 2 bloody bowel movements. The patient also endorses generalized weakness and fatigue, diffuse abdominal tenderness, blurred vision with green spots. She denies chest pain, dyspnea, urinary symptoms, fever/chills, LOC or palpitations. She did fall about 3 days ago, landing on her buttocks, she denies hitting her  head.   She reports continuing to take all her prescribed medications including her valsartan, torsemide and Eliquis.   ED course: Upon arrival patient significantly hypotensive and drowsy. Sepsis protocol initiated and due to sever hypotension, central venous catheter placed and vasopressor started. Labs significant for hyponatremia, hypochloremia, AGMA, AKI on CKD, elevated Alk Phos, leukocytosis and significant lactic acidosis with fecal occult positive. Small amount of hematochezia noted in ED. Imaging significant for pan colitis without signs of toxic megacolon and cholelithiasis without cholecystis.  Medications given: cefepime/vancomycin/flagyl, levophed drip, 2.75 L IVF bolus Initial Vitals: 97.4, 15, 90, 62/35 & 100% on RA  C.Diff testing: Positive   CXR 02/27/24: no acute cardiopulmonary disease CT abdomen/pelvis wo contrast 02/27/24: Diffuse colonic wall thickening and surrounding inflammation compatible with pancolitis. Cholelithiasis.   PCCM consulted for admission due to severe sepsis with shock secondary to recurrent C.Diff colitis requiring vasopressor support.  02/27/24: Admit to ICU with severe sepsis and shock secondary to recurrent C.Diff colitis requiring vasopressor support. 02/28/24: on two pressors, rising lactic acidosis, surgery consulted, underwent colectomy 02/29/24: awake and follows commands off sedation, vasopressor requirements improved, extubated 03/01/24: remains weak but continues to improve, on low dose nor-epinephrine 03/02/24: pressor requirements improving, denies pain, tolerating sips 03/03/24: Vasopressor requirements continue to downtrend.  Tolerating clear liquids.  Patient was transferred to [facility] service on 03/04/2024  Assessment and Plan:  #Septic shock due to C. difficile colitis S/p vasopressor, gradually weaned off Monitor vitals 4/8 increased midodrine 10 mg p.o. 3 times daily    # C. 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 Staples can be removed on POD 14 (04/16)  S/p antibiotics Started diet, tolerating well  # Hypophosphatemia due to nutritional deficiency Phos repleted. Monitor electrolytes and replete as needed.  # Hyperkalemia, mild, resolved after Lokelma 10 g one-time dose given Monitor electrolytes daily # Hyponatremia, monitor sodium level daily # Respiratory failure postop, required ventilator S/p intubation, successfully extubated.  Currently saturating well on room air.  # AKI and metabolic acidosis Cr 4.17--->0.78, AKI resolved  #Heart failure with reduced ejection fraction #Atrial fibrillation  #s/p Watchman procedure (11/2023) #CAD status post CABG S/p heparin IV infusion, transition to Eliquis Hold GDMT for now due to hypotension, resume when BP stable 4/7 Lasix 40 mg one-time dose given due to shortness of breath 4/10 started Lasix 20 mg IV every 6 hourly, slow diuresis due to soft blood pressure follow 2D echocardiogram to eval EF ?  # Acute blood loss anemia Monitor H&H  # IDDM T2 Hyperglycemia Started Semglee 20 units nightly Continue with NovoLog sliding scale, monitor CBG, continue diabetic diet  # Hypothyroid, continue Synthroid    Body mass index is 28.08 kg/m.  Nutrition Problem: Inadequate oral intake Etiology: acute illness Interventions:  Diet: Soft diet DVT Prophylaxis: Therapeutic Anticoagulation with Eliquis    Advance goals of care discussion: Full code  Family Communication: family was not present at bedside, at the time of interview.  The pt provided permission to discuss medical plan with the family. Opportunity was given to ask question and all questions were answered satisfactorily.   Disposition:  Pt is from home, admitted with septic shock due to C. difficile colitis s/p ex lap, total  colectomy and ileostomy, still has low BP on midodrine, advance diet to soft diet on 4/8, which precludes a safe discharge. Discharge to home, when stable, still has low blood pressure on midodrine.  4/10 started low-dose Lasix IV due to soft blood pressure.  Patient will be monitored for 1 to 2 days    Subjective: No significant events overnight, patient feels mild shortness of breath and lower extremity edema, requesting for diuretics. Patient blood pressure was soft, started low-dose IV Lasix. Patient will be monitored closely.   Physical Exam: General: NAD, sitting comfortably in the recliner Appear in no distress, affect appropriate Eyes: PERRLA ENT: Oral Mucosa Clear, moist  Neck: no JVD,  Cardiovascular: S1 and S2 Present, no Murmur,  Respiratory: good respiratory effort, Bilateral Air entry equal and Decreased, no Crackles, no wheezes Abdomen: Bowel Sound present, Soft and mild postop tenderness, ileostomy intact Skin: no rashes Extremities: 4+ Pedal edema, no calf tenderness Neurologic: without any new focal findings Gait not checked due to patient safety concerns  Vitals:   03/07/24 0357 03/07/24 0820 03/07/24 1434 03/07/24 1448  BP: (!) 98/59 104/68  117/73  Pulse: 85 99  76  Resp: 18 16    Temp: 97.6 F (36.4 C)   98.8 F (37.1 C)  TempSrc: Oral     SpO2: 99% 97%  100%  Weight:   67.4 kg   Height:        Intake/Output Summary (Last 24 hours) at 03/07/2024 1506 Last data filed at 03/07/2024 1432 Gross per 24 hour  Intake --  Output 1119 ml  Net -1119 ml   Filed Weights   03/03/24 0400 03/05/24 0430 03/07/24 1434  Weight: 63.9 kg 64.9 kg 67.4 kg  Data Reviewed: I have personally reviewed and interpreted daily labs, tele strips, imagings as discussed above. I reviewed all nursing notes, pharmacy notes, vitals, pertinent old records I have discussed plan of care as described above with RN and patient/family.  CBC: Recent Labs  Lab 03/03/24 0531  03/04/24 0345 03/05/24 0518 03/06/24 0407 03/07/24 0507  WBC 14.5* 12.6* 14.2* 13.7* 10.4  HGB 8.6* 7.9* 8.2* 8.6* 8.3*  HCT 25.8* 24.5* 25.2* 26.2* 24.7*  MCV 93.8 97.2 96.9 96.3 96.1  PLT 143* 104* 114* 136* 114*   Basic Metabolic Panel: Recent Labs  Lab 03/03/24 0531 03/04/24 0345 03/04/24 0346 03/04/24 1600 03/05/24 0518 03/06/24 0407 03/07/24 0507  NA 137 135 135 133* 133* 132* 130*  K 4.6 5.3* 5.3* 4.5 4.7 4.9 5.3*  CL 101 103 102 102 102 101 101  CO2 29 26 26 23 24 25 23   GLUCOSE 101* 131* 132* 304* 117* 117* 110*  BUN 41* 35* 36* 35* 31* 29* 26*  CREATININE 1.25* 1.03* 0.98 0.93 0.78 0.75 0.81  CALCIUM 8.6* 8.4* 8.4* 7.9* 8.1* 8.4* 8.7*  MG 1.7 2.2  --   --  1.8 1.9 1.7  PHOS 1.3*  --  <1.0* 1.9* 2.0* 2.6 2.9    Studies: No results found.  Scheduled Meds:  apixaban  5 mg Oral BID   Chlorhexidine Gluconate Cloth  6 each Topical Daily   feeding supplement (GLUCERNA SHAKE)  237 mL Oral TID BM   furosemide  20 mg Intravenous Q6H   Gerhardt's butt cream   Topical BID   insulin aspart  0-20 Units Subcutaneous Q4H   insulin glargine-yfgn  20 Units Subcutaneous Q2200   latanoprost  1 drop Both Eyes QHS   levothyroxine  50 mcg Oral Q0600   lidocaine  1 patch Transdermal Q0600   midodrine  10 mg Oral TID WC   multivitamin with minerals  1 tablet Oral Daily   pantoprazole  40 mg Oral Daily   thiamine  100 mg Oral Daily   Continuous Infusions: PRN Meds: acetaminophen, albuterol, morphine injection, mouth rinse, oxyCODONE  Time spent: 55 minutes  Author: Gillis Santa. MD [facility] 03/07/2024 3:06 PM  To reach On-call, see care teams to locate the attending and reach out to them via [email or web address]. If 7PM-7AM, please contact night-coverage If you still have difficulty reaching the attending provider, please page the [facility] (Director on Call) for [facility] on amion for assistance.

## 2024-03-07 NOTE — Progress Notes (Signed)
 Physical Therapy Treatment Patient Details Name: Tammy Arias MRN: [id number] DOB: Feb 10, 1956 Today's Date: 03/07/2024   History of Present Illness Patient is a 68 year old female  s/p exploratory laparotomy, total colectomy, and end ileostomy creation for septic shock secondary to C diff colitis. Vasopressor support required. History of CAD s/p CABG, heart failure    PT Comments  Pt upright at Eob upon arrival and agreeable to PT.  Pt able to transfer without any complications and then ambulate around the nursing station, down her hallway, and then back to the room.  Pt denied any complications and was able to ambulate with greater speed.  Pt noted no increase in pain as well.  Upon arrival, pt receiving a bladder scan.  Pt and caregiver concerned about fluid in the LE's and were going to be speaking with the MD about the increased fluid retention.      If plan is discharge home, recommend the following: A little help with walking and/or transfers;A little help with bathing/dressing/bathroom;Assist for transportation;Help with stairs or ramp for entrance   Can travel by private vehicle        Equipment Recommendations  None recommended by PT    Recommendations for Other Services       Precautions / Restrictions Precautions Precautions: Fall Recall of Precautions/Restrictions: Intact Precaution/Restrictions Comments: ostomy     Mobility  Bed Mobility Overal bed mobility: Needs Assistance Bed Mobility: Sit to Supine       Sit to supine: Modified independent (Device/Increase time)   General bed mobility comments: pt upright at EOB upon arrival to the room.  returned to the bed at the conclusion of the session.    Transfers Overall transfer level: Needs assistance Equipment used: Rolling walker (2 wheels) Transfers: Sit to/from Stand Sit to Stand: Supervision                Ambulation/Gait Ambulation/Gait assistance: Contact guard assist, Supervision    Assistive device: Rolling walker (2 wheels) Gait Pattern/deviations: Step-through pattern, Decreased stride length Gait velocity: decreased     General Gait Details: Pt able to ambulate well with safe and asteady gait.   Stairs             Wheelchair Mobility     Tilt Bed    Modified Rankin (Stroke Patients Only)       Balance Overall balance assessment: Needs assistance Sitting-balance support: Feet supported Sitting balance-Leahy Scale: Fair     Standing balance support: Bilateral upper extremity supported, Reliant on assistive device for balance, During functional activity Standing balance-Leahy Scale: Fair                              Communication    Cognition Arousal: Alert Behavior During Therapy: WFL for tasks assessed/performed   PT - Cognitive impairments: No apparent impairments                         Following commands: Intact      Cueing    Exercises      General Comments        Pertinent Vitals/Pain Pain Assessment Pain Assessment: No/denies pain    Home Living                          Prior Function            PT Goals (current  goals can now be found in the care plan section) Acute Rehab PT Goals Patient Stated Goal: to go home PT Goal Formulation: With patient Time For Goal Achievement: 03/18/24 Potential to Achieve Goals: Good Progress towards PT goals: Progressing toward goals    Frequency    Min 2X/week      PT Plan      Co-evaluation              AM-PAC PT "6 Clicks" Mobility   Outcome Measure  Help needed turning from your back to your side while in a flat bed without using bedrails?: A Little Help needed moving from lying on your back to sitting on the side of a flat bed without using bedrails?: A Little Help needed moving to and from a bed to a chair (including a wheelchair)?: A Little Help needed standing up from a chair using your arms (e.g., wheelchair or  bedside chair)?: A Little Help needed to walk in hospital room?: A Little Help needed climbing 3-5 steps with a railing? : A Little 6 Click Score: 18    End of Session Equipment Utilized During Treatment: Gait belt Activity Tolerance: Patient tolerated treatment well Patient left: in chair;with call bell/phone within reach Nurse Communication: Mobility status PT Visit Diagnosis: Muscle weakness (generalized) (M62.81);Unsteadiness on feet (R26.81)     Time: 7106-2694 PT Time Calculation (min) (ACUTE ONLY): 20 min  Charges:    $Therapeutic Activity: 8-22 mins PT General Charges $$ ACUTE PT VISIT: 1 Visit                     Nolon Bussing, PT, DPT Physical Therapist - [facility]  03/07/24, 11:55 AM

## 2024-03-07 NOTE — Consult Note (Signed)
 WOC Nurse ostomy follow up Stoma type/location: end ileostomy  Stomal assessment/size: 1 3/4", well budded, os at center, pink and moist  Peristomal assessment: mild contact dermatitis; treated with no-sting barrier wipes  Treatment options for stomal/peristomal skin: 2" barrier ring  Output gelatinous brown stool Ostomy pouching: 1pc.soft convex in the room, used this, however can transition to 1pc flat (well budded stoma), and 2" barrier ring  Education provided:  Met with patient's planned CG and patient today Explained role of ostomy nurse and creation of stoma  Explained stoma characteristics (budded, flush, color, texture, care) Demonstrated pouch change (cutting new skin barrier, measuring stoma, cleaning peristomal skin and stoma, use of barrier ring) Education on emptying when 1/3 to 1/2 full and how to empty. When I arrived pouch (lock and roll closure was not closed, however it had not come completely open, great time to teach about the importance of closing the lock and roll closure.  Also pouch was almost completely full, patient reported she was aware, no staff had been in to assist with emptying. We emptied and CG and patient observed  Demonstrated use of wick to clean spout  Discussed bathing, diet, gas, medication use, constipation, diarrhea, dehydration  Discussed food blockage   Provided patient with [employer] and marked items currently using Patient to have [facility] for continued support with ostomy teaching and care (6) 1pc flat pouches and barrier rings ordered to room to prep for DC to home.     Enrolled patient in [place] Secure Start Discharge program: Yes  [place] to Dr. Claudine Mouton and [facility], needs for [facility] and for referral to the outpatient clinic.   WOC Nurse will follow along with you for continued support with ostomy teaching and care Kaidynce Pfister [facility], RN, [place], CNS, [place] [phone]

## 2024-03-07 NOTE — Plan of Care (Signed)
  Problem: Clinical Measurements: Goal: Ability to maintain clinical measurements within normal limits will improve Outcome: Progressing Goal: Will remain free from infection Outcome: Progressing Goal: Diagnostic test results will improve Outcome: Progressing Goal: Respiratory complications will improve Outcome: Progressing Goal: Cardiovascular complication will be avoided Outcome: Progressing   Problem: Activity: Goal: Risk for activity intolerance will decrease Outcome: Progressing   Problem: Nutrition: Goal: Adequate nutrition will be maintained Outcome: Progressing   Problem: Coping: Goal: Level of anxiety will decrease Outcome: Progressing   Problem: Elimination: Goal: Will not experience complications related to bowel motility Outcome: Progressing Goal: Will not experience complications related to urinary retention Outcome: Progressing   Problem: Pain Managment: Goal: General experience of comfort will improve and/or be controlled Outcome: Progressing   Problem: Safety: Goal: Ability to remain free from injury will improve Outcome: Progressing

## 2024-03-07 NOTE — Progress Notes (Signed)
 Occupational Therapy Treatment Patient Details Name: Tammy Arias MRN: [id number] DOB: [birth year] Today's Date: 03/07/2024   History of present illness Patient is a 68 year old female  s/p exploratory laparotomy, total colectomy, and end ileostomy creation for septic shock secondary to C diff colitis. Vasopressor support required. History of CAD s/p CABG, heart failure   OT comments  Chart reviewed to date, pt agreeable to OT tx session but also eager to see WOC for ostomy training. Pt in agreement to trial toilet transfers, reports itching/discomfort in peri care. Bed mobility performed with CGA-MIN A, amb to bathroom with RW with supervision, peri care with MAX A for thoroughness. Educated Hospital doctor re: safe ADL completion and appropriate body mechanics for bed mobility. Pt is left as received, all needs met. OT will continue to follow.       If plan is discharge home, recommend the following:  A little help with walking and/or transfers;A little help with bathing/dressing/bathroom;Assistance with cooking/housework;Assist for transportation;Help with stairs or ramp for entrance   Equipment Recommendations  BSC/3in1    Recommendations for Other Services      Precautions / Restrictions Precautions Precautions: Fall Recall of Precautions/Restrictions: Intact Precaution/Restrictions Comments: ostomy Restrictions Weight Bearing Restrictions Per Provider Order: No       Mobility Bed Mobility Overal bed mobility: Needs Assistance Bed Mobility: Sit to Supine, Supine to Sit     Supine to sit: Contact guard, HOB elevated Sit to supine: Min assist, Used rails   General bed mobility comments: intermittent vcs for body mechanics    Transfers Overall transfer level: Needs assistance Equipment used: Rolling walker (2 wheels) Transfers: Sit to/from Stand Sit to Stand: Supervision, Contact guard assist                 Balance Overall balance assessment: Needs  assistance Sitting-balance support: Feet supported Sitting balance-Leahy Scale: Good     Standing balance support: Bilateral upper extremity supported, Reliant on assistive device for balance, During functional activity Standing balance-Leahy Scale: Fair                             ADL either performed or assessed with clinical judgement   ADL Overall ADL's : Needs assistance/impaired     Grooming: Supervision/safety;Standing Grooming Details (indicate cue type and reason):                  Toilet Transfer: Supervision;Rolling walker (2 wheels);Regular Toilet   Toileting- Clothing Manipulation and Hygiene: Sit to/from stand;Maximal assistance Toileting - Clothing Manipulation Details (indicate cue type and reason): for thorough cleansing of peri area per pt request     Functional mobility during ADLs: Supervision Rolling walker (2 wheels) (approx 10' two attempts)      Extremity/Trunk Assessment              Vision       Perception     Praxis     Communication Communication Communication: No apparent difficulties   Cognition Arousal: Alert Behavior During Therapy: WFL for tasks assessed/performed Cognition: No apparent impairments                               Following commands: Intact        Cueing   Cueing Techniques: Verbal cues  Exercises Other Exercises Other Exercises: edu pt re: role of OT, role of rehab, dishcarge recommendations  Shoulder Instructions       General Comments vss throughout;    Pertinent Vitals/ Pain       Pain Assessment Pain Assessment: Faces Faces Pain Scale: Hurts little more Pain Location: peri area, flaky skin itching Pain Descriptors / Indicators: Discomfort Pain Intervention(s): Monitored during session, Repositioned, Limited activity within patient's tolerance  Home Living                                          Prior Functioning/Environment               Frequency  Min 2X/week        Progress Toward Goals  OT Goals(current goals can now be found in the care plan section)  Progress towards OT goals: Progressing toward goals  Acute Rehab OT Goals Time For Goal Achievement: 03/18/24  Plan      Co-evaluation                 AM-PAC OT "6 Clicks" Daily Activity     Outcome Measure   Help from another person eating meals?: None Help from another person taking care of personal grooming?: None Help from another person toileting, which includes using toliet, bedpan, or urinal?: A Little Help from another person bathing (including washing, rinsing, drying)?: A Little Help from another person to put on and taking off regular upper body clothing?: None Help from another person to put on and taking off regular lower body clothing?: A Little 6 Click Score: 21    End of Session Equipment Utilized During Treatment: Rolling walker (2 wheels)  OT Visit Diagnosis: Unsteadiness on feet (R26.81);Repeated falls (R29.6);Muscle weakness (generalized) (M62.81)   Activity Tolerance Patient tolerated treatment well   Patient Left in bed;with call bell/phone within reach;with bed alarm set;with family/visitor present   Nurse Communication Mobility status        Time: 1610-9604 OT Time Calculation (min): 12 min  Charges: OT General Charges $OT Visit: 1 Visit OT Treatments $Self Care/Home Management : 8-22 mins Oleta Mouse, OTD OTR/L  03/07/24, 12:55 PM

## 2024-03-08 DIAGNOSIS — R6521 Severe sepsis with septic shock: Secondary | ICD-10-CM | POA: Diagnosis not present

## 2024-03-08 LAB — GLUCOSE, CAPILLARY
Glucose-Capillary: 105 mg/dL — ABNORMAL HIGH (ref 70–99)
Glucose-Capillary: 109 mg/dL — ABNORMAL HIGH (ref 70–99)
Glucose-Capillary: 121 mg/dL — ABNORMAL HIGH (ref 70–99)
Glucose-Capillary: 169 mg/dL — ABNORMAL HIGH (ref 70–99)
Glucose-Capillary: 170 mg/dL — ABNORMAL HIGH (ref 70–99)
Glucose-Capillary: 207 mg/dL — ABNORMAL HIGH (ref 70–99)
Glucose-Capillary: 237 mg/dL — ABNORMAL HIGH (ref 70–99)

## 2024-03-08 LAB — CBC
HCT: 23.1 % — ABNORMAL LOW (ref 36.0–46.0)
Hemoglobin: 7.8 g/dL — ABNORMAL LOW (ref 12.0–15.0)
MCH: 32.6 pg (ref 26.0–34.0)
MCHC: 33.8 g/dL (ref 30.0–36.0)
MCV: 96.7 fL (ref 80.0–100.0)
Platelets: 127 10*3/uL — ABNORMAL LOW (ref 150–400)
RBC: 2.39 MIL/uL — ABNORMAL LOW (ref 3.87–5.11)
RDW: 21.2 % — ABNORMAL HIGH (ref 11.5–15.5)
WBC: 8.4 10*3/uL (ref 4.0–10.5)
nRBC: 0 % (ref 0.0–0.2)

## 2024-03-08 LAB — BASIC METABOLIC PANEL WITH GFR
Anion gap: 9 (ref 5–15)
BUN: 27 mg/dL — ABNORMAL HIGH (ref 8–23)
CO2: 21 mmol/L — ABNORMAL LOW (ref 22–32)
Calcium: 8.9 mg/dL (ref 8.9–10.3)
Chloride: 100 mmol/L (ref 98–111)
Creatinine, Ser: 0.81 mg/dL (ref 0.44–1.00)
GFR, Estimated: >60 mL/min (ref >60)
Glucose, Bld: 98 mg/dL (ref 70–99)
Potassium: 4.6 mmol/L (ref 3.5–5.1)
Sodium: 130 mmol/L — ABNORMAL LOW (ref 135–145)

## 2024-03-08 LAB — PHOSPHORUS: Phosphorus: 3.7 mg/dL (ref 2.5–4.6)

## 2024-03-08 LAB — MAGNESIUM: Magnesium: 1.7 mg/dL (ref 1.7–2.4)

## 2024-03-08 MED ORDER — FUROSEMIDE 10 MG/ML IJ SOLN
20.0000 mg | INTRAMUSCULAR | Status: DC
  Administered 2024-03-08 – 2024-03-09 (×6): 20 mg via INTRAVENOUS
  Filled 2024-03-08 (×6): qty 4

## 2024-03-08 NOTE — Progress Notes (Signed)
 Heart Failure Navigator Progress Note  Assessed for Heart & Vascular TOC clinic readiness.  Patient does not meet criteria due to current [facility] patient.   Navigator will sign off at this time.  Roxy Horseman, RN, BSN [facility] Heart Failure Navigator Secure Chat Only

## 2024-03-08 NOTE — TOC Progression Note (Signed)
 Transition of Care [facility]) - Progression Note    Patient Details  Name: Tammy Arias MRN: [id number] Date of Birth: 09-Jun-1956  Transition of Care [facility]) CM/SW Contact  Chapman Fitch, RN Phone Number: 03/08/2024, 2:58 PM  Clinical Narrative:     Patient remains on IV lasix BSC was delivered to bedside by [employer] today TOC to notify [facility] home health at discharge   Expected Discharge Plan: Home w Home Health Services Barriers to Discharge: Continued Medical Work up  Expected Discharge Plan and Services   Discharge Planning Services: CM Consult Post Acute Care Choice: Durable Medical Equipment, Home Health Living arrangements for the past 2 months: Apartment                 DME Arranged: 3-N-1 DME Agency: [facility] Date DME Agency Contacted: 03/05/24 Time DME Agency Contacted: 1100 Representative spoke with at DME Agency: [name] HH Arranged: PT, OT HH Agency: [facility] Date [facility] Agency Contacted: 03/05/24 Time HH Agency Contacted: 1059 Representative spoke with at [facility] Agency: [name]   Social Determinants of Health (SDOH) Interventions SDOH Screenings   Food Insecurity: No Food Insecurity (02/28/2024)  Recent Concern: Food Insecurity - Food Insecurity Present (12/13/2023)   Received from [facility]  Housing: High Risk (02/28/2024)  Transportation Needs: No Transportation Needs (02/28/2024)  Utilities: Not At Risk (02/28/2024)  Financial Resource Strain: Low Risk  (02/11/2024)   Received from [facility]  Recent Concern: Financial Resource Strain - Medium Risk (12/13/2023)   [facility]  Social Connections: Moderately Integrated (02/28/2024)  Tobacco Use: Medium Risk (02/28/2024)    Readmission Risk Interventions    03/01/2024   12:07 PM 02/29/2024   11:10 AM  Readmission Risk Prevention Plan  Transportation Screening Complete   PCP or Specialist Appt within 3-5 Days Complete Complete   HRI or Home Care Consult Complete Complete  Social Work Consult for Recovery Care Planning/Counseling Complete Complete  Palliative Care Screening Not Applicable Not Applicable  Medication Review Oceanographer) Complete

## 2024-03-08 NOTE — Plan of Care (Signed)
  Problem: Education: Goal: Knowledge of General Education information will improve Description: Including pain rating scale, medication(s)/side effects and non-pharmacologic comfort measures Outcome: Progressing   Problem: Clinical Measurements: Goal: Ability to maintain clinical measurements within normal limits will improve Outcome: Progressing   Problem: Clinical Measurements: Goal: Ability to maintain clinical measurements within normal limits will improve Outcome: Progressing Goal: Will remain free from infection Outcome: Progressing Goal: Diagnostic test results will improve Outcome: Progressing Goal: Respiratory complications will improve Outcome: Progressing Goal: Cardiovascular complication will be avoided Outcome: Progressing

## 2024-03-08 NOTE — Plan of Care (Signed)
  Problem: Health Behavior/Discharge Planning: Goal: Ability to manage health-related needs will improve Outcome: Progressing   Problem: Clinical Measurements: Goal: Ability to maintain clinical measurements within normal limits will improve Outcome: Progressing Goal: Will remain free from infection Outcome: Progressing Goal: Diagnostic test results will improve Outcome: Progressing Goal: Respiratory complications will improve Outcome: Progressing Goal: Cardiovascular complication will be avoided Outcome: Progressing   Problem: Activity: Goal: Risk for activity intolerance will decrease Outcome: Progressing   Problem: Nutrition: Goal: Adequate nutrition will be maintained Outcome: Progressing   Problem: Elimination: Goal: Will not experience complications related to bowel motility Outcome: Progressing Goal: Will not experience complications related to urinary retention Outcome: Progressing   Problem: Pain Managment: Goal: General experience of comfort will improve and/or be controlled Outcome: Progressing   Problem: Safety: Goal: Ability to remain free from injury will improve Outcome: Progressing   Problem: Skin Integrity: Goal: Risk for impaired skin integrity will decrease Outcome: Progressing

## 2024-03-08 NOTE — Progress Notes (Signed)
 Mobility Specialist - Progress Note   03/08/24 1547  Mobility  Activity Ambulated with assistance in hallway  Level of Assistance Standby assist, set-up cues, supervision of patient - no hands on  Assistive Device Front wheel walker  Distance Ambulated (ft) 320 ft  Activity Response Tolerated well  Mobility visit 1 Mobility  Mobility Specialist Start Time (ACUTE ONLY) 1532  Mobility Specialist Stop Time (ACUTE ONLY) 1543  Mobility Specialist Time Calculation (min) (ACUTE ONLY) 11 min   Zetta Bills Mobility Specialist 03/08/24 3:49 PM

## 2024-03-08 NOTE — Progress Notes (Signed)
 [facility] Progress Note  Patient: Tammy Arias    NFA:[id number]  DOA: 02/27/2024     Date of Service: the patient was seen and examined on 03/08/2024  Chief Complaint  Patient presents with   Diarrhea   Brief hospital course: 68 yo F presenting to [facility] ED from home for evaluation of diarrhea and vomiting since 02/25/24.   History obtained per chart review and patient bedside report. Patient has had multiple hospitalizations since January 2025 with GI bleeding s/p clipping, watchman device placement and most recently acute CHF exacerbation from 01/26/24- 02/13/24.  During her most recent admission, she required loop diuretics as well as inotrope assistance for adequate diuresis. She was changed from entresto to valsartan due to hypotension and her BB was stopped. She was continued on Eliquis post watchman device per EP recommendations after 6 week TEE on 01/23/24 noted a small flow jet from the LAA back into the LA proper at the edge of the inferior aspect of the occluder device.  She also tested positive for C.Diff completing a 10 day course of PO vancomycin for treatment on 02/10/24.   After discharge she was in her normal state of health until noticing recurrent diarrhea on 02/17/24, which she thought might just be an IBS flare. Diarrhea however worsened on 02/21/24 which got much worse on 3/28 with the addition of persistent non-bloody bilious emesis. Her best friend, who has been helping care for her stated she noticed some blood in her stool that seemed to be from the patient's hemorrhoids. However this bleeding significantly increased on 02/27/24 and the patient has had at least 2 bloody bowel movements. The patient also endorses generalized weakness and fatigue, diffuse abdominal tenderness, blurred vision with green spots. She denies chest pain, dyspnea, urinary symptoms, fever/chills, LOC or palpitations. She did fall about 3 days ago, landing on her buttocks, she denies hitting her  head.   She reports continuing to take all her prescribed medications including her valsartan, torsemide and Eliquis.   ED course: Upon arrival patient significantly hypotensive and drowsy. Sepsis protocol initiated and due to sever hypotension, central venous catheter placed and vasopressor started. Labs significant for hyponatremia, hypochloremia, AGMA, AKI on CKD, elevated Alk Phos, leukocytosis and significant lactic acidosis with fecal occult positive. Small amount of hematochezia noted in ED. Imaging significant for pan colitis without signs of toxic megacolon and cholelithiasis without cholecystis.  Medications given: cefepime/vancomycin/flagyl, levophed drip, 2.75 L IVF bolus Initial Vitals: 97.4, 15, 90, 62/35 & 100% on RA  C.Diff testing: Positive   CXR 02/27/24: no acute cardiopulmonary disease CT abdomen/pelvis wo contrast 02/27/24: Diffuse colonic wall thickening and surrounding inflammation compatible with pancolitis. Cholelithiasis.   PCCM consulted for admission due to severe sepsis with shock secondary to recurrent C.Diff colitis requiring vasopressor support.  02/27/24: Admit to ICU with severe sepsis and shock secondary to recurrent C.Diff colitis requiring vasopressor support. 02/28/24: on two pressors, rising lactic acidosis, surgery consulted, underwent colectomy 02/29/24: awake and follows commands off sedation, vasopressor requirements improved, extubated 03/01/24: remains weak but continues to improve, on low dose nor-epinephrine 03/02/24: pressor requirements improving, denies pain, tolerating sips 03/03/24: Vasopressor requirements continue to downtrend.  Tolerating clear liquids.  Patient was transferred to [facility] service on 03/04/2024  Assessment and Plan:  #Septic shock due to C. difficile colitis S/p vasopressor, gradually weaned off Monitor vitals 4/8 increased midodrine 10 mg p.o. 3 times daily    # C. 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 Staples can be removed on POD 14 (04/16)  S/p antibiotics Started diet, tolerating well  # Hypophosphatemia due to nutritional deficiency Phos repleted. Monitor electrolytes and replete as needed.  # Hyperkalemia, mild, resolved after Lokelma 10 g one-time dose given Monitor electrolytes daily # Hyponatremia, monitor sodium level daily # Respiratory failure postop, required ventilator S/p intubation, successfully extubated.  Currently saturating well on room air.  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 S/p heparin IV infusion, transition to Eliquis Hold GDMT for now due to hypotension, resume when BP stable 4/7 Lasix 40 mg one-time dose given due to shortness of breath 4/10 started Lasix 20 mg IV every 6 hourly, slow diuresis due to soft blood pressure 4/11 BNP 936 elevated and TTE shows LVEF 35 to 40%, moderately reduced RV systolic function, moderate PAH, severely enlarged left and right atrium, severe MR, severe TR 4/11 increased Lasix 20 mg IV every 4 hourly Monitor renal functions and urine output   # Acute blood loss anemia Monitor H&H  # IDDM T2 Hyperglycemia Started Semglee 20 units nightly Continue with NovoLog sliding scale, monitor CBG, continue diabetic diet  # Hypothyroid, continue Synthroid   Body mass index is 27.62 kg/m.  Nutrition Problem: Inadequate oral intake Etiology: acute illness Interventions:  Diet: Soft diet DVT Prophylaxis: Therapeutic Anticoagulation with Eliquis    Advance goals of care discussion: Full code  Family Communication: family was not present at bedside, at the time of interview.  The pt provided  permission to discuss medical plan with the family. Opportunity was given to ask question and all questions were answered satisfactorily.   Disposition:  Pt is from home, admitted with septic shock due to C. difficile colitis s/p ex lap, total colectomy and ileostomy, still has low BP on midodrine, advance diet to soft diet on 4/8, started IV Lasix due to significant lower extremity edema, which precludes a safe discharge. Discharge to home, when stable, may need few days to improve   Subjective: No significant events overnight, patient feels improvement in the lower extremity edema, making enough urine.  Denied any worsening of shortness of breath, abdominal pain is under control. Patient has some chronic backache and shoulder pain, she thinks it is osteoarthritis can be taken care of as an outpatient.   Physical Exam: General: NAD, sitting comfortably in the recliner Appear in no distress, affect appropriate Eyes: PERRLA ENT: Oral Mucosa Clear, moist  Neck: no JVD,  Cardiovascular: S1 and S2 Present, no Murmur,  Respiratory: good respiratory effort, Bilateral Air entry equal and Decreased, no Crackles, no wheezes Abdomen: Bowel Sound present, Soft and mild postop tenderness, ileostomy intact Skin: no rashes Extremities: 4+ Pedal edema, no calf tenderness Neurologic: without any new focal findings Gait not checked due to patient safety concerns  Vitals:   03/08/24 0233 03/08/24 0331 03/08/24 0733 03/08/24 1000  BP: (!) 107/57 112/70 (!) 114/57   Pulse:  78 73   Resp:  20 18   Temp:  97.6 F (36.4 C) 98.6 F (37 C)   TempSrc:  Oral    SpO2:  99% 100%   Weight:    66.3 kg  Height:        Intake/Output Summary (Last 24 hours) at  03/08/2024 1424 Last data filed at 03/08/2024 1334 Gross per 24 hour  Intake --  Output 2769 ml  Net -2769 ml   Filed Weights   03/05/24 0430 03/07/24 1434 03/08/24 1000  Weight: 64.9 kg 67.4 kg 66.3 kg    Data Reviewed: I have personally  reviewed and interpreted daily labs, tele strips, imagings as discussed above. I reviewed all nursing notes, pharmacy notes, vitals, pertinent old records I have discussed plan of care as described above with RN and patient/family.  CBC: Recent Labs  Lab 03/04/24 0345 03/05/24 0518 03/06/24 0407 03/07/24 0507 03/08/24 0452  WBC 12.6* 14.2* 13.7* 10.4 8.4  HGB 7.9* 8.2* 8.6* 8.3* 7.8*  HCT 24.5* 25.2* 26.2* 24.7* 23.1*  MCV 97.2 96.9 96.3 96.1 96.7  PLT 104* 114* 136* 114* 127*   Basic Metabolic Panel: Recent Labs  Lab 03/04/24 0345 03/04/24 0346 03/04/24 1600 03/05/24 0518 03/06/24 0407 03/07/24 0507 03/08/24 0452  NA 135   < > 133* 133* 132* 130* 130*  K 5.3*   < > 4.5 4.7 4.9 5.3* 4.6  CL 103   < > 102 102 101 101 100  CO2 26   < > 23 24 25 23  21*  GLUCOSE 131*   < > 304* 117* 117* 110* 98  BUN 35*   < > 35* 31* 29* 26* 27*  CREATININE 1.03*   < > 0.93 0.78 0.75 0.81 0.81  CALCIUM 8.4*   < > 7.9* 8.1* 8.4* 8.7* 8.9  MG 2.2  --   --  1.8 1.9 1.7 1.7  PHOS  --    < > 1.9* 2.0* 2.6 2.9 3.7   < > = values in this interval not displayed.    Studies: ECHOCARDIOGRAM COMPLETE Result Date: 03/07/2024    ECHOCARDIOGRAM REPORT   Patient Name:   Tammy Arias [facility] Date of Exam: 03/07/2024 Medical Rec #:  [id number]       Height:       61.0 in Accession #:    [id number]      Weight:       143.1 lb Date of Birth:  [birth year]       BSA:          1.638 m Patient Age:    68 years        BP:           104/68 mmHg Patient Gender: F               HR:           99 bpm. Exam Location:  [facility] Procedure: 2D Echo, Cardiac Doppler and Color Doppler (Both Spectral and Color            Flow Doppler were utilized during procedure). Indications:     CHF--acute systolic I50.21  History:         Patient has no prior history of Echocardiogram examinations.                  CHF, Arrythmias:Atrial Fibrillation; Risk Factors:Hypertension.                  Migraines.  Sonographer:     Cristela Blue Referring Phys:   [id number] Wava Kildow Diagnosing Phys: Mellody Drown Alluri IMPRESSIONS  1. Left ventricular ejection fraction, by estimation, is 35 to 40%. The left ventricle has moderately decreased function. The left ventricle demonstrates global hypokinesis. Left ventricular diastolic parameters are indeterminate.  2. Right ventricular systolic function is moderately  reduced. The right ventricular size is mildly enlarged. There is moderately elevated pulmonary artery systolic pressure. The estimated right ventricular systolic pressure is 55.9 mmHg.  3. Left atrial size was severely dilated.  4. Right atrial size was severely dilated.  5. The mitral valve is grossly normal. Severe mitral valve regurgitation.  6. Tricuspid valve regurgitation is severe.  7. The aortic valve was not well visualized. Aortic valve regurgitation is trivial. FINDINGS  Left Ventricle: Left ventricular ejection fraction, by estimation, is 35 to 40%. The left ventricle has moderately decreased function. The left ventricle demonstrates global hypokinesis. The left ventricular internal cavity size was normal in size. There is no left ventricular hypertrophy. Left ventricular diastolic parameters are indeterminate. Right Ventricle: The right ventricular size is mildly enlarged. No increase in right ventricular wall thickness. Right ventricular systolic function is moderately reduced. There is moderately elevated pulmonary artery systolic pressure. The tricuspid regurgitant velocity is 3.46 m/s, and with an assumed right atrial pressure of 8 mmHg, the estimated right ventricular systolic pressure is 55.9 mmHg. Left Atrium: Left atrial size was severely dilated. Right Atrium: Right atrial size was severely dilated. Pericardium: There is no evidence of pericardial effusion. Mitral Valve: The mitral valve is grossly normal. There is mild thickening of the mitral valve leaflet(s). Severe mitral valve regurgitation. Tricuspid Valve: The tricuspid valve is grossly  normal. Tricuspid valve regurgitation is severe. Aortic Valve: The aortic valve was not well visualized. Aortic valve regurgitation is trivial. Aortic valve mean gradient measures 3.3 mmHg. Aortic valve peak gradient measures 5.6 mmHg. Aortic valve area, by VTI measures 1.67 cm. Pulmonic Valve: The pulmonic valve was not well visualized. Pulmonic valve regurgitation is trivial. Aorta: The aortic root is normal in size and structure. IAS/Shunts: The atrial septum is grossly normal.  LEFT VENTRICLE PLAX 2D LVIDd:         5.30 cm LVIDs:         4.50 cm LV PW:         1.10 cm LV IVS:        0.90 cm LVOT diam:     2.00 cm LV SV:         36 LV SV Index:   22 LVOT Area:     3.14 cm  RIGHT VENTRICLE RV Basal diam:  4.60 cm RV Mid diam:    4.00 cm RV S prime:     6.74 cm/s TAPSE (M-mode): 1.1 cm LEFT ATRIUM             Index        RIGHT ATRIUM           Index LA diam:        5.60 cm 3.42 cm/m   RA Area:     26.50 cm LA Vol (A2C):   64.4 ml 39.31 ml/m  RA Volume:   96.00 ml  58.60 ml/m LA Vol (A4C):   77.8 ml 47.49 ml/m LA Biplane Vol: 71.8 ml 43.83 ml/m  AORTIC VALVE AV Area (Vmax):    1.54 cm AV Area (Vmean):   1.64 cm AV Area (VTI):     1.67 cm AV Vmax:           118.63 cm/s AV Vmean:          81.233 cm/s AV VTI:            0.216 m AV Peak Grad:      5.6 mmHg AV Mean Grad:      3.3  mmHg LVOT Vmax:         58.30 cm/s LVOT Vmean:        42.400 cm/s LVOT VTI:          0.115 m LVOT/AV VTI ratio: 0.53  AORTA Ao Root diam: 2.60 cm MITRAL VALVE                TRICUSPID VALVE MV Area (PHT): 4.36 cm     TR Peak grad:   47.9 mmHg MV Decel Time: 174 msec     TR Vmax:        346.00 cm/s MV E velocity: 147.00 cm/s                             SHUNTS                             Systemic VTI:  0.12 m                             Systemic Diam: 2.00 cm Windell Norfolk Electronically signed by Windell Norfolk Signature Date/Time: 03/07/2024/5:26:18 PM    Final     Scheduled Meds:  apixaban  5 mg Oral BID   Chlorhexidine  Gluconate Cloth  6 each Topical Daily   feeding supplement (GLUCERNA SHAKE)  237 mL Oral TID BM   furosemide  20 mg Intravenous Q4H   Gerhardt's butt cream   Topical BID   insulin aspart  0-20 Units Subcutaneous Q4H   insulin glargine-yfgn  20 Units Subcutaneous Q2200   latanoprost  1 drop Both Eyes QHS   levothyroxine  50 mcg Oral Q0600   lidocaine  1 patch Transdermal Q0600   midodrine  10 mg Oral TID WC   multivitamin with minerals  1 tablet Oral Daily   pantoprazole  40 mg Oral Daily   thiamine  100 mg Oral Daily   Continuous Infusions: PRN Meds: acetaminophen, albuterol, morphine injection, mouth rinse, oxyCODONE  Time spent: 55 minutes  Author: Gillis Santa. MD [facility] 03/08/2024 2:24 PM  To reach On-call, see care teams to locate the attending and reach out to them via [email or web address]. If 7PM-7AM, please contact night-coverage If you still have difficulty reaching the attending provider, please page the [facility] (Director on Call) for [facility] on amion for assistance.

## 2024-03-09 DIAGNOSIS — R6521 Severe sepsis with septic shock: Secondary | ICD-10-CM | POA: Diagnosis not present

## 2024-03-09 LAB — BASIC METABOLIC PANEL WITH GFR
Anion gap: 9 (ref 5–15)
BUN: 27 mg/dL — ABNORMAL HIGH (ref 8–23)
CO2: 27 mmol/L (ref 22–32)
Calcium: 8.7 mg/dL — ABNORMAL LOW (ref 8.9–10.3)
Chloride: 100 mmol/L (ref 98–111)
Creatinine, Ser: 0.89 mg/dL (ref 0.44–1.00)
GFR, Estimated: >60 mL/min (ref >60)
Glucose, Bld: 98 mg/dL (ref 70–99)
Potassium: 4.4 mmol/L (ref 3.5–5.1)
Sodium: 136 mmol/L (ref 135–145)

## 2024-03-09 LAB — CBC
HCT: 24.2 % — ABNORMAL LOW (ref 36.0–46.0)
Hemoglobin: 8.1 g/dL — ABNORMAL LOW (ref 12.0–15.0)
MCH: 32.9 pg (ref 26.0–34.0)
MCHC: 33.5 g/dL (ref 30.0–36.0)
MCV: 98.4 fL (ref 80.0–100.0)
Platelets: 120 10*3/uL — ABNORMAL LOW (ref 150–400)
RBC: 2.46 MIL/uL — ABNORMAL LOW (ref 3.87–5.11)
RDW: 22.2 % — ABNORMAL HIGH (ref 11.5–15.5)
WBC: 6.3 10*3/uL (ref 4.0–10.5)
nRBC: 0.3 % — ABNORMAL HIGH (ref 0.0–0.2)

## 2024-03-09 LAB — GLUCOSE, CAPILLARY
Glucose-Capillary: 133 mg/dL — ABNORMAL HIGH (ref 70–99)
Glucose-Capillary: 138 mg/dL — ABNORMAL HIGH (ref 70–99)
Glucose-Capillary: 169 mg/dL — ABNORMAL HIGH (ref 70–99)
Glucose-Capillary: 220 mg/dL — ABNORMAL HIGH (ref 70–99)
Glucose-Capillary: 227 mg/dL — ABNORMAL HIGH (ref 70–99)
Glucose-Capillary: 71 mg/dL (ref 70–99)

## 2024-03-09 LAB — PHOSPHORUS: Phosphorus: 4.1 mg/dL (ref 2.5–4.6)

## 2024-03-09 LAB — MAGNESIUM: Magnesium: 1.6 mg/dL — ABNORMAL LOW (ref 1.7–2.4)

## 2024-03-09 MED ORDER — MAGNESIUM SULFATE 2 GM/50ML IV SOLN
2.0000 g | Freq: Once | INTRAVENOUS | Status: AC
Start: 2024-03-09 — End: 2024-03-09
  Administered 2024-03-09: 2 g via INTRAVENOUS
  Filled 2024-03-09: qty 50

## 2024-03-09 MED ORDER — FUROSEMIDE 10 MG/ML IJ SOLN
40.0000 mg | Freq: Four times a day (QID) | INTRAMUSCULAR | Status: DC
  Administered 2024-03-09 – 2024-03-12 (×12): 40 mg via INTRAVENOUS
  Filled 2024-03-09 (×14): qty 4

## 2024-03-09 NOTE — Plan of Care (Signed)
  Problem: Clinical Measurements: Goal: Ability to maintain clinical measurements within normal limits will improve Outcome: Progressing Goal: Will remain free from infection Outcome: Progressing Goal: Diagnostic test results will improve Outcome: Progressing Goal: Respiratory complications will improve Outcome: Progressing   Problem: Activity: Goal: Risk for activity intolerance will decrease Outcome: Progressing   Problem: Nutrition: Goal: Adequate nutrition will be maintained Outcome: Progressing   Problem: Coping: Goal: Level of anxiety will decrease Outcome: Progressing   Problem: Elimination: Goal: Will not experience complications related to bowel motility Outcome: Progressing   Problem: Safety: Goal: Ability to remain free from injury will improve Outcome: Progressing   Problem: Skin Integrity: Goal: Risk for impaired skin integrity will decrease Outcome: Progressing   Problem: Nutritional: Goal: Progress toward achieving an optimal weight will improve Outcome: Progressing   Problem: Skin Integrity: Goal: Risk for impaired skin integrity will decrease Outcome: Progressing   Problem: Tissue Perfusion: Goal: Adequacy of tissue perfusion will improve Outcome: Progressing

## 2024-03-09 NOTE — Plan of Care (Signed)

## 2024-03-09 NOTE — Progress Notes (Signed)
 [facility] Progress Note  Patient: Tammy Arias    ZOX:[id number]  DOA: 02/27/2024     Date of Service: the patient was seen and examined on 03/09/2024  Chief Complaint  Patient presents with   Diarrhea   Brief hospital course: 68 yo F presenting to [facility] ED from home for evaluation of diarrhea and vomiting since 02/25/24.   History obtained per chart review and patient bedside report. Patient has had multiple hospitalizations since January 2025 with GI bleeding s/p clipping, watchman device placement and most recently acute CHF exacerbation from 01/26/24- 02/13/24.  During her most recent admission, she required loop diuretics as well as inotrope assistance for adequate diuresis. She was changed from entresto to valsartan due to hypotension and her BB was stopped. She was continued on Eliquis post watchman device per EP recommendations after 6 week TEE on 01/23/24 noted a small flow jet from the LAA back into the LA proper at the edge of the inferior aspect of the occluder device.  She also tested positive for C.Diff completing a 10 day course of PO vancomycin for treatment on 02/10/24.   After discharge she was in her normal state of health until noticing recurrent diarrhea on 02/17/24, which she thought might just be an IBS flare. Diarrhea however worsened on 02/21/24 which got much worse on 3/28 with the addition of persistent non-bloody bilious emesis. Her best friend, who has been helping care for her stated she noticed some blood in her stool that seemed to be from the patient's hemorrhoids. However this bleeding significantly increased on 02/27/24 and the patient has had at least 2 bloody bowel movements. The patient also endorses generalized weakness and fatigue, diffuse abdominal tenderness, blurred vision with green spots. She denies chest pain, dyspnea, urinary symptoms, fever/chills, LOC or palpitations. She did fall about 3 days ago, landing on her buttocks, she denies hitting her  head.   She reports continuing to take all her prescribed medications including her valsartan, torsemide and Eliquis.   ED course: Upon arrival patient significantly hypotensive and drowsy. Sepsis protocol initiated and due to sever hypotension, central venous catheter placed and vasopressor started. Labs significant for hyponatremia, hypochloremia, AGMA, AKI on CKD, elevated Alk Phos, leukocytosis and significant lactic acidosis with fecal occult positive. Small amount of hematochezia noted in ED. Imaging significant for pan colitis without signs of toxic megacolon and cholelithiasis without cholecystis.  Medications given: cefepime/vancomycin/flagyl, levophed drip, 2.75 L IVF bolus Initial Vitals: 97.4, 15, 90, 62/35 & 100% on RA  C.Diff testing: Positive   CXR 02/27/24: no acute cardiopulmonary disease CT abdomen/pelvis wo contrast 02/27/24: Diffuse colonic wall thickening and surrounding inflammation compatible with pancolitis. Cholelithiasis.   PCCM consulted for admission due to severe sepsis with shock secondary to recurrent C.Diff colitis requiring vasopressor support.  02/27/24: Admit to ICU with severe sepsis and shock secondary to recurrent C.Diff colitis requiring vasopressor support. 02/28/24: on two pressors, rising lactic acidosis, surgery consulted, underwent colectomy 02/29/24: awake and follows commands off sedation, vasopressor requirements improved, extubated 03/01/24: remains weak but continues to improve, on low dose nor-epinephrine 03/02/24: pressor requirements improving, denies pain, tolerating sips 03/03/24: Vasopressor requirements continue to downtrend.  Tolerating clear liquids.  Patient was transferred to [facility] service on 03/04/2024  Assessment and Plan:  #Septic shock due to C. difficile colitis S/p vasopressor, gradually weaned off Monitor vitals 4/8 increased midodrine 10 mg p.o. 3 times daily    # C. 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 Staples can be removed on POD 14 (04/16)  S/p antibiotics Started diet, tolerating well  # Hypomagnesemia, mag repleted. # Hypophosphatemia due to nutritional deficiency Phos repleted. Monitor electrolytes and replete as needed.  # Hyperkalemia, mild, resolved after Lokelma 10 g one-time dose given Monitor electrolytes daily # Hyponatremia, monitor sodium level daily # Respiratory failure postop, required ventilator S/p intubation, successfully extubated.  Currently saturating well on room air.  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 S/p heparin IV infusion, transition to Eliquis Hold GDMT for now due to hypotension, resume when BP stable 4/7 Lasix 40 mg one-time dose given due to shortness of breath 4/10 started Lasix 20 mg IV every 6 hourly, slow diuresis due to soft blood pressure 4/11 BNP 936 elevated and TTE shows LVEF 35 to 40%, moderately reduced RV systolic function, moderate PAH, severely enlarged left and right atrium, severe MR, severe TR 4/11 increased Lasix 20 mg IV every 4 hourly 4/12 increased to Lasix 40 mg IV every 6 hours Monitor renal functions and urine output   # Acute blood loss anemia Monitor H&H  # IDDM T2 Hyperglycemia Started Semglee 20 units nightly Continue with NovoLog sliding scale, monitor CBG, continue diabetic diet  # Hypothyroid, continue Synthroid   Body mass index is 26.99 kg/m.  Nutrition Problem: Inadequate oral intake Etiology: acute illness Interventions:  Diet: Soft diet DVT Prophylaxis: Therapeutic Anticoagulation with Eliquis    Advance goals of care discussion: Full code  Family Communication:  family was not present at bedside, at the time of interview.  The pt provided permission to discuss medical plan with the family. Opportunity was given to ask question and all questions were answered satisfactorily.   Disposition:  Pt is from home, admitted with septic shock due to C. difficile colitis s/p ex lap, total colectomy and ileostomy, still has low BP on midodrine, advance diet to soft diet on 4/8, started IV Lasix due to significant lower extremity edema, which precludes a safe discharge. Discharge to home, when stable, may need few days to improve   Subjective: No significant events overnight, patient is making enough urine, feels improvement in the lower extremity edema but still has a lot of fluid in the lower extremities.  Feels improvement in the shortness of breath and dyspnea on exertion.  No chest pain or palpitations. Blood pressure is stable now, can increase Lasix dose.   Physical Exam: General: NAD, sitting comfortably in the recliner Appear in no distress, affect appropriate Eyes: PERRLA ENT: Oral Mucosa Clear, moist  Neck: no JVD,  Cardiovascular: S1 and S2 Present, no Murmur,  Respiratory: good respiratory effort, Bilateral Air entry equal and Decreased, no Crackles, no wheezes Abdomen: Bowel Sound present, Soft and mild postop tenderness, ileostomy intact Skin: no rashes Extremities: 4+ Pedal edema, no calf tenderness Neurologic: without any new focal findings Gait not checked due to patient safety concerns  Vitals:   03/09/24 0339 03/09/24 0500 03/09/24 0754 03/09/24 1408  BP: 125/82  119/61 (!) 141/73  Pulse: 90  89 89  Resp: 20  18 18   Temp: 97.7 F (36.5 C)  98.4 F (36.9 C) 98.2 F (36.8 C)  TempSrc: Oral     SpO2: 100%  100% 99%  Weight:  64.8 kg    Height:        Intake/Output Summary (Last 24 hours) at 03/09/2024 1459 Last data filed at 03/09/2024 1300 Gross per 24 hour  Intake --  Output 2450 ml  Net -2450 ml   Filed Weights    03/07/24 1434 03/08/24 1000 03/09/24 0500  Weight: 67.4 kg 66.3 kg 64.8 kg    Data Reviewed: I have personally reviewed and interpreted daily labs, tele strips, imagings as discussed above. I reviewed all nursing notes, pharmacy notes, vitals, pertinent old records I have discussed plan of care as described above with RN and patient/family.  CBC: Recent Labs  Lab 03/05/24 0518 03/06/24 0407 03/07/24 0507 03/08/24 0452 03/09/24 0526  WBC 14.2* 13.7* 10.4 8.4 6.3  HGB 8.2* 8.6* 8.3* 7.8* 8.1*  HCT 25.2* 26.2* 24.7* 23.1* 24.2*  MCV 96.9 96.3 96.1 96.7 98.4  PLT 114* 136* 114* 127* 120*   Basic Metabolic Panel: Recent Labs  Lab 03/05/24 0518 03/06/24 0407 03/07/24 0507 03/08/24 0452 03/09/24 0526  NA 133* 132* 130* 130* 136  K 4.7 4.9 5.3* 4.6 4.4  CL 102 101 101 100 100  CO2 24 25 23  21* 27  GLUCOSE 117* 117* 110* 98 98  BUN 31* 29* 26* 27* 27*  CREATININE 0.78 0.75 0.81 0.81 0.89  CALCIUM 8.1* 8.4* 8.7* 8.9 8.7*  MG 1.8 1.9 1.7 1.7 1.6*  PHOS 2.0* 2.6 2.9 3.7 4.1    Studies: No results found.   Scheduled Meds:  apixaban  5 mg Oral BID   Chlorhexidine Gluconate Cloth  6 each Topical Daily   feeding supplement (GLUCERNA SHAKE)  237 mL Oral TID BM   furosemide  40 mg Intravenous Q6H   Gerhardt's butt cream   Topical BID   insulin aspart  0-20 Units Subcutaneous Q4H   insulin glargine-yfgn  20 Units Subcutaneous Q2200   latanoprost  1 drop Both Eyes QHS   levothyroxine  50 mcg Oral Q0600   lidocaine  1 patch Transdermal Q0600   midodrine  10 mg Oral TID WC   multivitamin with minerals  1 tablet Oral Daily   pantoprazole  40 mg Oral Daily   thiamine  100 mg Oral Daily   Continuous Infusions: PRN Meds: acetaminophen, albuterol, morphine injection, mouth rinse, oxyCODONE  Time spent: 55 minutes  Author: Althia Atlas. MD [facility] 03/09/2024 2:59 PM  To reach On-call, see care teams to locate the attending and reach out to them via  [email or web address]. If 7PM-7AM, please contact night-coverage If you still have difficulty reaching the attending provider, please page the [facility] (Director on Call) for [facility] on amion for assistance.

## 2024-03-10 DIAGNOSIS — R6521 Severe sepsis with septic shock: Secondary | ICD-10-CM | POA: Diagnosis not present

## 2024-03-10 LAB — BASIC METABOLIC PANEL WITH GFR
Anion gap: 10 (ref 5–15)
BUN: 27 mg/dL — ABNORMAL HIGH (ref 8–23)
CO2: 26 mmol/L (ref 22–32)
Calcium: 8.7 mg/dL — ABNORMAL LOW (ref 8.9–10.3)
Chloride: 99 mmol/L (ref 98–111)
Creatinine, Ser: 0.82 mg/dL (ref 0.44–1.00)
GFR, Estimated: >60 mL/min (ref >60)
Glucose, Bld: 86 mg/dL (ref 70–99)
Potassium: 3.7 mmol/L (ref 3.5–5.1)
Sodium: 135 mmol/L (ref 135–145)

## 2024-03-10 LAB — GLUCOSE, CAPILLARY
Glucose-Capillary: 101 mg/dL — ABNORMAL HIGH (ref 70–99)
Glucose-Capillary: 70 mg/dL (ref 70–99)
Glucose-Capillary: 113 mg/dL — ABNORMAL HIGH (ref 70–99)
Glucose-Capillary: 202 mg/dL — ABNORMAL HIGH (ref 70–99)
Glucose-Capillary: 203 mg/dL — ABNORMAL HIGH (ref 70–99)
Glucose-Capillary: 169 mg/dL — ABNORMAL HIGH (ref 70–99)
Glucose-Capillary: 132 mg/dL — ABNORMAL HIGH (ref 70–99)

## 2024-03-10 LAB — CBC
HCT: 22.5 % — ABNORMAL LOW (ref 36.0–46.0)
Hemoglobin: 7.8 g/dL — ABNORMAL LOW (ref 12.0–15.0)
MCH: 34.4 pg — ABNORMAL HIGH (ref 26.0–34.0)
MCHC: 34.7 g/dL (ref 30.0–36.0)
MCV: 99.1 fL (ref 80.0–100.0)
Platelets: 111 10*3/uL — ABNORMAL LOW (ref 150–400)
RBC: 2.27 MIL/uL — ABNORMAL LOW (ref 3.87–5.11)
RDW: 22.5 % — ABNORMAL HIGH (ref 11.5–15.5)
WBC: 5.6 10*3/uL (ref 4.0–10.5)
nRBC: 0 % (ref 0.0–0.2)

## 2024-03-10 LAB — PHOSPHORUS: Phosphorus: 3.7 mg/dL (ref 2.5–4.6)

## 2024-03-10 LAB — MAGNESIUM: Magnesium: 1.8 mg/dL (ref 1.7–2.4)

## 2024-03-10 MED ORDER — INSULIN GLARGINE-YFGN 100 UNIT/ML ~~LOC~~ SOLN
15.0000 [IU] | Freq: Every day | SUBCUTANEOUS | Status: DC
  Administered 2024-03-10 – 2024-03-11 (×2): 15 [IU] via SUBCUTANEOUS
  Filled 2024-03-10 (×3): qty 0.15

## 2024-03-10 MED ORDER — MAGNESIUM OXIDE -MG SUPPLEMENT 400 (240 MG) MG PO TABS
400.0000 mg | ORAL_TABLET | Freq: Two times a day (BID) | ORAL | Status: DC
  Administered 2024-03-10 – 2024-03-11 (×3): 400 mg via ORAL
  Filled 2024-03-10 (×3): qty 1

## 2024-03-10 MED ORDER — INSULIN ASPART 100 UNIT/ML IJ SOLN
0.0000 [IU] | Freq: Three times a day (TID) | INTRAMUSCULAR | Status: DC
  Administered 2024-03-10: 3 [IU] via SUBCUTANEOUS
  Administered 2024-03-10 – 2024-03-11 (×2): 7 [IU] via SUBCUTANEOUS
  Administered 2024-03-11: 3 [IU] via SUBCUTANEOUS
  Administered 2024-03-11: 7 [IU] via SUBCUTANEOUS
  Administered 2024-03-12: 3 [IU] via SUBCUTANEOUS
  Filled 2024-03-10 (×6): qty 1

## 2024-03-10 MED ORDER — POTASSIUM CHLORIDE CRYS ER 20 MEQ PO TBCR
40.0000 meq | EXTENDED_RELEASE_TABLET | Freq: Once | ORAL | Status: AC
  Administered 2024-03-10: 40 meq via ORAL
  Filled 2024-03-10: qty 2

## 2024-03-10 NOTE — Progress Notes (Signed)
 [facility] Progress Note  Patient: Tammy Arias    ZOX:[id number]  DOA: 02/27/2024     Date of Service: the patient was seen and examined on 03/10/2024  Chief Complaint  Patient presents with   Diarrhea   Brief hospital course: 68 yo F presenting to [facility] ED from home for evaluation of diarrhea and vomiting since 02/25/24.   History obtained per chart review and patient bedside report. Patient has had multiple hospitalizations since January 2025 with GI bleeding s/p clipping, watchman device placement and most recently acute CHF exacerbation from 01/26/24- 02/13/24.  During her most recent admission, she required loop diuretics as well as inotrope assistance for adequate diuresis. She was changed from entresto to valsartan due to hypotension and her BB was stopped. She was continued on Eliquis post watchman device per EP recommendations after 6 week TEE on 01/23/24 noted a small flow jet from the LAA back into the LA proper at the edge of the inferior aspect of the occluder device.  She also tested positive for C.Diff completing a 10 day course of PO vancomycin for treatment on 02/10/24.   After discharge she was in her normal state of health until noticing recurrent diarrhea on 02/17/24, which she thought might just be an IBS flare. Diarrhea however worsened on 02/21/24 which got much worse on 3/28 with the addition of persistent non-bloody bilious emesis. Her best friend, who has been helping care for her stated she noticed some blood in her stool that seemed to be from the patient's hemorrhoids. However this bleeding significantly increased on 02/27/24 and the patient has had at least 2 bloody bowel movements. The patient also endorses generalized weakness and fatigue, diffuse abdominal tenderness, blurred vision with green spots. She denies chest pain, dyspnea, urinary symptoms, fever/chills, LOC or palpitations. She did fall about 3 days ago, landing on her buttocks, she denies hitting her  head.   She reports continuing to take all her prescribed medications including her valsartan, torsemide and Eliquis.   ED course: Upon arrival patient significantly hypotensive and drowsy. Sepsis protocol initiated and due to sever hypotension, central venous catheter placed and vasopressor started. Labs significant for hyponatremia, hypochloremia, AGMA, AKI on CKD, elevated Alk Phos, leukocytosis and significant lactic acidosis with fecal occult positive. Small amount of hematochezia noted in ED. Imaging significant for pan colitis without signs of toxic megacolon and cholelithiasis without cholecystis.  Medications given: cefepime/vancomycin/flagyl, levophed drip, 2.75 L IVF bolus Initial Vitals: 97.4, 15, 90, 62/35 & 100% on RA  C.Diff testing: Positive   CXR 02/27/24: no acute cardiopulmonary disease CT abdomen/pelvis wo contrast 02/27/24: Diffuse colonic wall thickening and surrounding inflammation compatible with pancolitis. Cholelithiasis.   PCCM consulted for admission due to severe sepsis with shock secondary to recurrent C.Diff colitis requiring vasopressor support.  02/27/24: Admit to ICU with severe sepsis and shock secondary to recurrent C.Diff colitis requiring vasopressor support. 02/28/24: on two pressors, rising lactic acidosis, surgery consulted, underwent colectomy 02/29/24: awake and follows commands off sedation, vasopressor requirements improved, extubated 03/01/24: remains weak but continues to improve, on low dose nor-epinephrine 03/02/24: pressor requirements improving, denies pain, tolerating sips 03/03/24: Vasopressor requirements continue to downtrend.  Tolerating clear liquids.  Patient was transferred to [facility] service on 03/04/2024  Assessment and Plan:  #Septic shock due to C. difficile colitis S/p vasopressor, gradually weaned off Monitor vitals 4/8 increased midodrine 10 mg p.o. 3 times daily    # C. 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 Staples can be removed on POD 14 (04/16)  S/p antibiotics Started diet, tolerating well  # Hypomagnesemia, mag repleted. # Hypophosphatemia due to nutritional deficiency Phos repleted. Monitor electrolytes and replete as needed.  # Hyperkalemia, mild, resolved after Lokelma 10 g one-time dose given Monitor electrolytes daily # Hyponatremia, monitor sodium level daily # Respiratory failure postop, required ventilator S/p intubation, successfully extubated.  Currently saturating well on room air.  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 S/p heparin IV infusion, transition to Eliquis Hold GDMT for now due to hypotension, resume when BP stable 4/7 Lasix 40 mg one-time dose given due to shortness of breath 4/10 started Lasix 20 mg IV every 6 hourly, slow diuresis due to soft blood pressure 4/11 BNP 936 elevated and TTE shows LVEF 35 to 40%, moderately reduced RV systolic function, moderate PAH, severely enlarged left and right atrium, severe MR, severe TR 4/11 increased Lasix 20 mg IV every 4 hourly 4/12 increased to Lasix 40 mg IV every 6 hours Monitor renal functions and urine output   # Acute blood loss anemia Monitor H&H  # IDDM T2 Hyperglycemia 4/13 noticed early morning hypoglycemic episodes, decreased Semglee from 20 to 15 units nightly  Continue with NovoLog sliding scale, monitor CBG, continue diabetic diet Monitor CBG and titrate dose accordingly  # Hypothyroid, continue Synthroid   Body mass index is 26.53 kg/m.  Nutrition Problem: Inadequate oral intake Etiology: acute illness Interventions:  Diet: Soft diet DVT Prophylaxis: Therapeutic  Anticoagulation with Eliquis    Advance goals of care discussion: Full code  Family Communication: family was not present at bedside, at the time of interview.  The pt provided permission to discuss medical plan with the family. Opportunity was given to ask question and all questions were answered satisfactorily.   Disposition:  Pt is from home, admitted with septic shock due to C. difficile colitis s/p ex lap, total colectomy and ileostomy, still has low BP on midodrine, advance diet to soft diet on 4/8, started IV Lasix due to significant lower extremity edema, which precludes a safe discharge. Discharge to home, when stable, may need few days to improve   Subjective: No significant events overnight, patient is making enough urine, feels improvement in the lower extremity edema.  Tolerating diet well, abdominal pain is under control.  Denied any active issues.   Physical Exam: General: NAD, sitting comfortably in the recliner Appear in no distress, affect appropriate Eyes: PERRLA ENT: Oral Mucosa Clear, moist  Neck: no JVD,  Cardiovascular: S1 and S2 Present, no Murmur,  Respiratory: good respiratory effort, Bilateral Air entry equal and Decreased, no Crackles, no wheezes Abdomen: Bowel Sound present, Soft and mild postop tenderness, ileostomy intact Skin: no rashes Extremities: 4+ Pedal edema, no calf tenderness Neurologic: without any new focal findings Gait not checked due to patient safety concerns  Vitals:   03/09/24 2025 03/10/24 0353 03/10/24 0500 03/10/24 0848  BP: (!) 111/46 (!) 114/57  112/67  Pulse: 85 91  91  Resp: 16 16    Temp: 97.7 F (36.5 C) 98 F (36.7 C)  98.2 F (36.8 C)  TempSrc:      SpO2: 99% 98%  98%  Weight:   63.7 kg  Height:        Intake/Output Summary (Last 24 hours) at 03/10/2024 1336 Last data filed at 03/10/2024 1253 Gross per 24 hour  Intake 237 ml  Output 2200 ml  Net -1963 ml   Filed Weights   03/08/24 1000 03/09/24 0500 03/10/24  0500  Weight: 66.3 kg 64.8 kg 63.7 kg    Data Reviewed: I have personally reviewed and interpreted daily labs, tele strips, imagings as discussed above. I reviewed all nursing notes, pharmacy notes, vitals, pertinent old records I have discussed plan of care as described above with RN and patient/family.  CBC: Recent Labs  Lab 03/06/24 0407 03/07/24 0507 03/08/24 0452 03/09/24 0526 03/10/24 0510  WBC 13.7* 10.4 8.4 6.3 5.6  HGB 8.6* 8.3* 7.8* 8.1* 7.8*  HCT 26.2* 24.7* 23.1* 24.2* 22.5*  MCV 96.3 96.1 96.7 98.4 99.1  PLT 136* 114* 127* 120* 111*   Basic Metabolic Panel: Recent Labs  Lab 03/06/24 0407 03/07/24 0507 03/08/24 0452 03/09/24 0526 03/10/24 0510  NA 132* 130* 130* 136 135  K 4.9 5.3* 4.6 4.4 3.7  CL 101 101 100 100 99  CO2 25 23 21* 27 26  GLUCOSE 117* 110* 98 98 86  BUN 29* 26* 27* 27* 27*  CREATININE 0.75 0.81 0.81 0.89 0.82  CALCIUM 8.4* 8.7* 8.9 8.7* 8.7*  MG 1.9 1.7 1.7 1.6* 1.8  PHOS 2.6 2.9 3.7 4.1 3.7    Studies: No results found.   Scheduled Meds:  apixaban  5 mg Oral BID   Chlorhexidine Gluconate Cloth  6 each Topical Daily   feeding supplement (GLUCERNA SHAKE)  237 mL Oral TID BM   furosemide  40 mg Intravenous Q6H   Gerhardt's butt cream   Topical BID   insulin aspart  0-20 Units Subcutaneous Q4H   insulin glargine-yfgn  20 Units Subcutaneous Q2200   latanoprost  1 drop Both Eyes QHS   levothyroxine  50 mcg Oral Q0600   lidocaine  1 patch Transdermal Q0600   magnesium oxide  400 mg Oral BID   midodrine  10 mg Oral TID WC   multivitamin with minerals  1 tablet Oral Daily   pantoprazole  40 mg Oral Daily   thiamine  100 mg Oral Daily   Continuous Infusions: PRN Meds: acetaminophen, albuterol, morphine injection, mouth rinse, oxyCODONE  Time spent: 55 minutes  Author: Althia Atlas. MD [facility] 03/10/2024 1:36 PM  To reach On-call, see care teams to locate the attending and reach out to them via [email or web address]. If  7PM-7AM, please contact night-coverage If you still have difficulty reaching the attending provider, please page the [facility] (Director on Call) for [facility] on amion for assistance.

## 2024-03-10 NOTE — Plan of Care (Signed)

## 2024-03-10 NOTE — Progress Notes (Signed)
 Mobility Specialist - Progress Note   03/10/24 1702  Mobility  Activity Ambulated independently in hallway  Level of Assistance Independent  Assistive Device Front wheel walker  Distance Ambulated (ft) 340 ft  Activity Response Tolerated well  Mobility Referral Yes  Mobility visit 1 Mobility  Mobility Specialist Start Time (ACUTE ONLY) 1642  Mobility Specialist Stop Time (ACUTE ONLY) 1700  Mobility Specialist Time Calculation (min) (ACUTE ONLY) 18 min   Pt OOB in doorway upon arrival. Pt ambulates 2 laps around NS indep. Pt returns to recliner with needs in reach.   Wash Hack  Mobility Specialist  03/10/24 5:02 PM

## 2024-03-11 DIAGNOSIS — R6521 Severe sepsis with septic shock: Secondary | ICD-10-CM | POA: Diagnosis not present

## 2024-03-11 LAB — BASIC METABOLIC PANEL WITH GFR
Anion gap: 11 (ref 5–15)
BUN: 28 mg/dL — ABNORMAL HIGH (ref 8–23)
CO2: 27 mmol/L (ref 22–32)
Calcium: 8.9 mg/dL (ref 8.9–10.3)
Chloride: 100 mmol/L (ref 98–111)
Creatinine, Ser: 0.92 mg/dL (ref 0.44–1.00)
GFR, Estimated: >60 mL/min (ref >60)
Glucose, Bld: 230 mg/dL — ABNORMAL HIGH (ref 70–99)
Potassium: 4 mmol/L (ref 3.5–5.1)
Sodium: 138 mmol/L (ref 135–145)

## 2024-03-11 LAB — CBC
HCT: 26.3 % — ABNORMAL LOW (ref 36.0–46.0)
Hemoglobin: 8.5 g/dL — ABNORMAL LOW (ref 12.0–15.0)
MCH: 32.8 pg (ref 26.0–34.0)
MCHC: 32.3 g/dL (ref 30.0–36.0)
MCV: 101.5 fL — ABNORMAL HIGH (ref 80.0–100.0)
Platelets: 121 10*3/uL — ABNORMAL LOW (ref 150–400)
RBC: 2.59 MIL/uL — ABNORMAL LOW (ref 3.87–5.11)
RDW: 23.7 % — ABNORMAL HIGH (ref 11.5–15.5)
WBC: 6.2 10*3/uL (ref 4.0–10.5)
nRBC: 0 % (ref 0.0–0.2)

## 2024-03-11 LAB — GLUCOSE, CAPILLARY
Glucose-Capillary: 118 mg/dL — ABNORMAL HIGH (ref 70–99)
Glucose-Capillary: 107 mg/dL — ABNORMAL HIGH (ref 70–99)
Glucose-Capillary: 205 mg/dL — ABNORMAL HIGH (ref 70–99)
Glucose-Capillary: 144 mg/dL — ABNORMAL HIGH (ref 70–99)
Glucose-Capillary: 243 mg/dL — ABNORMAL HIGH (ref 70–99)

## 2024-03-11 LAB — MAGNESIUM: Magnesium: 1.6 mg/dL — ABNORMAL LOW (ref 1.7–2.4)

## 2024-03-11 LAB — PHOSPHORUS: Phosphorus: 3 mg/dL (ref 2.5–4.6)

## 2024-03-11 MED ORDER — MAGNESIUM OXIDE -MG SUPPLEMENT 400 (240 MG) MG PO TABS
400.0000 mg | ORAL_TABLET | Freq: Two times a day (BID) | ORAL | Status: DC
  Administered 2024-03-12: 400 mg via ORAL
  Filled 2024-03-11: qty 1

## 2024-03-11 MED ORDER — MAGNESIUM SULFATE 2 GM/50ML IV SOLN
2.0000 g | Freq: Once | INTRAVENOUS | Status: AC
  Administered 2024-03-11: 2 g via INTRAVENOUS
  Filled 2024-03-11: qty 50

## 2024-03-11 MED ORDER — MELATONIN 5 MG PO TABS
5.0000 mg | ORAL_TABLET | Freq: Every evening | ORAL | Status: DC | PRN
  Administered 2024-03-11: 5 mg via ORAL
  Filled 2024-03-11: qty 1

## 2024-03-11 NOTE — Progress Notes (Signed)
 Mobility Specialist - Progress Note   03/11/24 1445  Mobility  Activity Ambulated independently in hallway  Level of Assistance Modified independent, requires aide device or extra time  Assistive Device Front wheel walker  Distance Ambulated (ft) 320 ft  Activity Response Tolerated well  Mobility visit 1 Mobility  Mobility Specialist Start Time (ACUTE ONLY) 1427  Mobility Specialist Stop Time (ACUTE ONLY) 1437  Mobility Specialist Time Calculation (min) (ACUTE ONLY) 10 min   Pt amb two laps around the NS ModI, tolerated well. Pt expressed feeling better this date in comparison to previous days. Pt returned to the room, left seated in the recliner with needs within reach. Family present at bedside.  Versa Gore Mobility Specialist 03/11/24 2:56 PM

## 2024-03-11 NOTE — Progress Notes (Signed)
 [facility] Progress Note  Patient: Tammy Arias    NWG:[id number]  DOA: 02/27/2024     Date of Service: the patient was seen and examined on 03/11/2024  Chief Complaint  Patient presents with   Diarrhea   Brief hospital course: 68 yo F presenting to [facility] ED from home for evaluation of diarrhea and vomiting since 02/25/24.   History obtained per chart review and patient bedside report. Patient has had multiple hospitalizations since January 2025 with GI bleeding s/p clipping, watchman device placement and most recently acute CHF exacerbation from 01/26/24- 02/13/24.  During her most recent admission, she required loop diuretics as well as inotrope assistance for adequate diuresis. She was changed from entresto to valsartan due to hypotension and her BB was stopped. She was continued on Eliquis post watchman device per EP recommendations after 6 week TEE on 01/23/24 noted a small flow jet from the LAA back into the LA proper at the edge of the inferior aspect of the occluder device.  She also tested positive for C.Diff completing a 10 day course of PO vancomycin for treatment on 02/10/24.   After discharge she was in her normal state of health until noticing recurrent diarrhea on 02/17/24, which she thought might just be an IBS flare. Diarrhea however worsened on 02/21/24 which got much worse on 3/28 with the addition of persistent non-bloody bilious emesis. Her best friend, who has been helping care for her stated she noticed some blood in her stool that seemed to be from the patient's hemorrhoids. However this bleeding significantly increased on 02/27/24 and the patient has had at least 2 bloody bowel movements. The patient also endorses generalized weakness and fatigue, diffuse abdominal tenderness, blurred vision with green spots. She denies chest pain, dyspnea, urinary symptoms, fever/chills, LOC or palpitations. She did fall about 3 days ago, landing on her buttocks, she denies hitting her  head.   She reports continuing to take all her prescribed medications including her valsartan, torsemide and Eliquis.   ED course: Upon arrival patient significantly hypotensive and drowsy. Sepsis protocol initiated and due to sever hypotension, central venous catheter placed and vasopressor started. Labs significant for hyponatremia, hypochloremia, AGMA, AKI on CKD, elevated Alk Phos, leukocytosis and significant lactic acidosis with fecal occult positive. Small amount of hematochezia noted in ED. Imaging significant for pan colitis without signs of toxic megacolon and cholelithiasis without cholecystis.  Medications given: cefepime/vancomycin/flagyl, levophed drip, 2.75 L IVF bolus Initial Vitals: 97.4, 15, 90, 62/35 & 100% on RA  C.Diff testing: Positive   CXR 02/27/24: no acute cardiopulmonary disease CT abdomen/pelvis wo contrast 02/27/24: Diffuse colonic wall thickening and surrounding inflammation compatible with pancolitis. Cholelithiasis.   PCCM consulted for admission due to severe sepsis with shock secondary to recurrent C.Diff colitis requiring vasopressor support.  02/27/24: Admit to ICU with severe sepsis and shock secondary to recurrent C.Diff colitis requiring vasopressor support. 02/28/24: on two pressors, rising lactic acidosis, surgery consulted, underwent colectomy 02/29/24: awake and follows commands off sedation, vasopressor requirements improved, extubated 03/01/24: remains weak but continues to improve, on low dose nor-epinephrine 03/02/24: pressor requirements improving, denies pain, tolerating sips 03/03/24: Vasopressor requirements continue to downtrend.  Tolerating clear liquids.  Patient was transferred to TRH service on 03/04/2024  Assessment and Plan:  #Septic shock due to C. difficile colitis S/p vasopressor, gradually weaned off Monitor vitals 4/8 increased midodrine 10 mg p.o. 3 times daily    # C. 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 Staples can be removed on POD 14 (04/16) but since pt is in the hospital and will be discharged on 4/15 and then staples can be removed tomorrow a.m. on 4/15 S/p antibiotics. Started diet, tolerating well  # Hypomagnesemia, mag repleted. # Hypophosphatemia due to nutritional deficiency Phos repleted. Monitor electrolytes and replete as needed.  # Hyperkalemia, mild, resolved after Lokelma 10 g one-time dose given Monitor electrolytes daily # Hyponatremia, monitor sodium level daily # Respiratory failure postop, required ventilator S/p intubation, successfully extubated.  Currently saturating well on room air.  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 S/p heparin IV infusion, transition to Eliquis Hold GDMT for now due to hypotension, resume when BP stable 4/7 Lasix 40 mg one-time dose given due to shortness of breath 4/10 started Lasix 20 mg IV every 6 hourly, slow diuresis due to soft blood pressure 4/11 BNP 936 elevated and TTE shows LVEF 35 to 40%, moderately reduced RV systolic function, moderate PAH, severely enlarged left and right atrium, severe MR, severe TR 4/11 increased Lasix 20 mg IV every 4 hourly 4/12 increased to Lasix 40 mg IV every 6 hours Monitor renal functions and urine output   # Acute blood loss anemia: H&H stable Monitor H&H  # IDDM T2 Hyperglycemia 4/13 noticed early morning hypoglycemic episodes, decreased Semglee from 20 to 15 units nightly  Continue with NovoLog sliding scale, monitor CBG, continue diabetic diet Monitor CBG and titrate dose accordingly  # Hypothyroid, continue Synthroid   Body mass index is 25.89 kg/m.   Nutrition Problem: Inadequate oral intake Etiology: acute illness Interventions:  Diet: Soft diet DVT Prophylaxis: Therapeutic Anticoagulation with Eliquis    Advance goals of care discussion: Full code  Family Communication: family was not present at bedside, at the time of interview.  The pt provided permission to discuss medical plan with the family. Opportunity was given to ask question and all questions were answered satisfactorily.   Disposition:  Pt is from home, admitted with septic shock due to C. difficile colitis s/p ex lap, total colectomy and ileostomy, still has low BP on midodrine, advance diet to soft diet on 4/8, started IV Lasix due to significant lower extremity edema, which precludes a safe discharge. Discharge to home, when stable, may need few days to improve   Subjective: No significant events overnight, patient feels improvement in the lower extremity edema, making enough urine.  Denies worsening of shortness of breath. Patient was asking about staple removal, discussed with general surgery, staples will be removed most likely tomorrow a.m.   Physical Exam: General: NAD, sitting comfortably in the recliner Appear in no distress, affect appropriate Eyes: PERRLA ENT: Oral Mucosa Clear, moist  Neck: no JVD,  Cardiovascular: S1 and S2 Present, no Murmur,  Respiratory: good respiratory effort, Bilateral Air entry equal and Decreased, no Crackles, no wheezes Abdomen: Bowel Sound present, Soft and mild postop tenderness, ileostomy intact Skin: no rashes Extremities: 3-4+ Pedal edema, no calf tenderness, edema improving Neurologic: without any new focal findings Gait not checked due to patient safety concerns  Vitals:   03/10/24 1924 03/11/24 0429 03/11/24 0749 03/11/24 1559  BP: 105/78 (!) 104/54 115/69 105/69  Pulse: 83 75 89 80  Resp: 18 16 (!) 22  17  Temp: 99 F (37.2 C) 98.4 F (36.9 C) 98.2 F (36.8 C) 99 F (37.2 C)  TempSrc: Oral Oral Oral Oral   SpO2: 100% 100% 100% 97%  Weight:  62.1 kg    Height:        Intake/Output Summary (Last 24 hours) at 03/11/2024 1620 Last data filed at 03/11/2024 1300 Gross per 24 hour  Intake 477 ml  Output 2850 ml  Net -2373 ml   Filed Weights   03/09/24 0500 03/10/24 0500 03/11/24 0429  Weight: 64.8 kg 63.7 kg 62.1 kg    Data Reviewed: I have personally reviewed and interpreted daily labs, tele strips, imagings as discussed above. I reviewed all nursing notes, pharmacy notes, vitals, pertinent old records I have discussed plan of care as described above with RN and patient/family.  CBC: Recent Labs  Lab 03/07/24 0507 03/08/24 0452 03/09/24 0526 03/10/24 0510 03/11/24 1015  WBC 10.4 8.4 6.3 5.6 6.2  HGB 8.3* 7.8* 8.1* 7.8* 8.5*  HCT 24.7* 23.1* 24.2* 22.5* 26.3*  MCV 96.1 96.7 98.4 99.1 101.5*  PLT 114* 127* 120* 111* 121*   Basic Metabolic Panel: Recent Labs  Lab 03/07/24 0507 03/08/24 0452 03/09/24 0526 03/10/24 0510 03/11/24 1015  NA 130* 130* 136 135 138  K 5.3* 4.6 4.4 3.7 4.0  CL 101 100 100 99 100  CO2 23 21* 27 26 27   GLUCOSE 110* 98 98 86 230*  BUN 26* 27* 27* 27* 28*  CREATININE 0.81 0.81 0.89 0.82 0.92  CALCIUM 8.7* 8.9 8.7* 8.7* 8.9  MG 1.7 1.7 1.6* 1.8 1.6*  PHOS 2.9 3.7 4.1 3.7 3.0    Studies: No results found.   Scheduled Meds:  apixaban  5 mg Oral BID   Chlorhexidine Gluconate Cloth  6 each Topical Daily   feeding supplement (GLUCERNA SHAKE)  237 mL Oral TID BM   furosemide  40 mg Intravenous Q6H   Gerhardt's butt cream   Topical BID   insulin aspart  0-20 Units Subcutaneous TID AC & HS   insulin glargine-yfgn  15 Units Subcutaneous Q2200   latanoprost  1 drop Both Eyes QHS   levothyroxine  50 mcg Oral Q0600   lidocaine  1 patch Transdermal Q0600   [START ON 03/12/2024] magnesium oxide  400 mg Oral BID   midodrine  10 mg Oral TID WC   multivitamin with minerals  1 tablet Oral Daily   pantoprazole  40 mg Oral Daily   thiamine  100 mg Oral  Daily   Continuous Infusions: PRN Meds: acetaminophen, albuterol, morphine injection, mouth rinse, oxyCODONE  Time spent: 40 minutes  Author: Althia Atlas. MD [facility] 03/11/2024 4:20 PM  To reach On-call, see care teams to locate the attending and reach out to them via [email or web address]. If 7PM-7AM, please contact night-coverage If you still have difficulty reaching the attending provider, please page the [facility] (Director on Call) for [facility] on amion for assistance.

## 2024-03-11 NOTE — TOC Progression Note (Signed)
 Transition of Care [facility]) - Progression Note    Patient Details  Name: Tammy Arias MRN: [id number] Date of Birth: [birth year]  Transition of Care [facility]) CM/SW Contact  Odilia Bennett, LCSW Phone Number: 03/11/2024, 10:38 AM  Clinical Narrative:   TOC continues to follow progress.  Expected Discharge Plan: Home w Home Health Services Barriers to Discharge: Continued Medical Work up  Expected Discharge Plan and Services   Discharge Planning Services: CM Consult Post Acute Care Choice: Durable Medical Equipment, Home Health Living arrangements for the past 2 months: Apartment                 DME Arranged: 3-N-1 DME Agency: [employer] Date DME Agency Contacted: 03/05/24 Time DME Agency Contacted: 1100 Representative spoke with at DME Agency: [name] HH Arranged: PT, OT HH Agency: [facility] Date [facility] Agency Contacted: 03/05/24 Time HH Agency Contacted: 1059 Representative spoke with at [facility] Agency: Bartholomew Light   Social Determinants of Health (SDOH) Interventions SDOH Screenings   Food Insecurity: No Food Insecurity (02/28/2024)  Recent Concern: Food Insecurity - Food Insecurity Present (12/13/2023)   Received from [facility]  Housing: High Risk (02/28/2024)  Transportation Needs: No Transportation Needs (02/28/2024)  Utilities: Not At Risk (02/28/2024)  Financial Resource Strain: Low Risk  (02/11/2024)   Received from [facility]  Recent Concern: Financial Resource Strain - Medium Risk (12/13/2023)   Received from [facility]  Social Connections: Moderately Integrated (02/28/2024)  Tobacco Use: Medium Risk (02/28/2024)    Readmission Risk Interventions    03/01/2024   12:07 PM 02/29/2024   11:10 AM  Readmission Risk Prevention Plan  Transportation Screening Complete   PCP or Specialist Appt within 3-5 Days Complete Complete  HRI or Home Care Consult Complete Complete  Social Work Consult for Recovery Care  Planning/Counseling Complete Complete  Palliative Care Screening Not Applicable Not Applicable  Medication Review [employer]) Complete

## 2024-03-11 NOTE — Progress Notes (Signed)
 Occupational Therapy Treatment Patient Details Name: Tammy Arias MRN: [id number] DOB: [birth year] Today's Date: 03/11/2024   History of present illness Patient is a 68 year old female  s/p exploratory laparotomy, total colectomy, and end ileostomy creation for septic shock secondary to C diff colitis. Vasopressor support required. History of CAD s/p CABG, heart failure   OT comments  Upon entering the room, pt seated in recliner chair and agreeable to OT intervention. OT assisted pt with donning her TED hose and she was able to demonstrate figure four position to don B socks without assistance. Pt stands and ambulates in room with RW at mod I level to bathroom. Pt performs toileting without assistance for clothing management, hygiene, and transfer. Pt also checking ostomy back for attention while seated on commode. Pt performs hand hygiene in standing and then returns back to recliner chair. Pt is at goal level of Mod I for all mobility and self care needs. OT to complete orders at this time.          Equipment Recommendations  BSC/3in1       Precautions / Restrictions Precautions Precautions: Fall Recall of Precautions/Restrictions: Intact Precaution/Restrictions Comments: ostomy       Mobility Bed Mobility               General bed mobility comments: pt up in recliner at start/end of session    Transfers Overall transfer level: Needs assistance Equipment used: Rolling walker (2 wheels) Transfers: Sit to/from Stand Sit to Stand: Modified independent (Device/Increase time)                 Balance Overall balance assessment: Needs assistance Sitting-balance support: Feet supported Sitting balance-Leahy Scale: Good     Standing balance support: Bilateral upper extremity supported, Reliant on assistive device for balance, During functional activity Standing balance-Leahy Scale: Good                             ADL either performed or assessed with  clinical judgement   ADL Overall ADL's : Modified independent                         Toilet Transfer: Modified Independent;Rolling walker (2 wheels);Comfort height toilet   Toileting- Clothing Manipulation and Hygiene: Modified independent;Sit to/from stand               Communication Communication Communication: No apparent difficulties   Cognition Arousal: Alert Behavior During Therapy: WFL for tasks assessed/performed Cognition: No apparent impairments                               Following commands: Intact                      Pertinent Vitals/ Pain       Pain Assessment Pain Assessment: No/denies pain            Progress Toward Goals  OT Goals(current goals can now be found in the care plan section)  Progress towards OT goals: Goals met/education completed, patient discharged from OT      AM-PAC OT "6 Clicks" Daily Activity     Outcome Measure   Help from another person eating meals?: None Help from another person taking care of personal grooming?: None Help from another person toileting, which includes using toliet, bedpan, or urinal?: None Help  from another person bathing (including washing, rinsing, drying)?: None Help from another person to put on and taking off regular upper body clothing?: None   6 Click Score: 20       Activity Tolerance Patient tolerated treatment well   Patient Left with call bell/phone within reach;in chair;with family/visitor present   Nurse Communication          Time: 1044-1100 OT Time Calculation (min): 16 min  Charges: OT General Charges $OT Visit: 1 Visit OT Treatments $Self Care/Home Management : 8-22 mins  George Kinder, MS, OTR/L , CBIS ascom [phone]  03/11/24, 11:29 AM

## 2024-03-11 NOTE — Progress Notes (Signed)
 Physical Therapy Treatment Patient Details Name: Tammy Arias MRN: [id number] DOB: January 29, 1956 Today's Date: 03/11/2024   History of Present Illness Patient is a 68 year old female  s/p exploratory laparotomy, total colectomy, and end ileostomy creation for septic shock secondary to C diff colitis. Vasopressor support required. History of CAD s/p CABG, heart failure    PT Comments  Patient alert, up in recliner at start and end of session, denied pain with mobility but did report some fatigue. She was able to perform transfers modI, ambulate ~478ft, and performed stairs with CGA and rail (minA handheld assist to practice assistance if needed). Pt/PT also discussed DME, no current needs from PT (has access to rollator, has RW, and a SPC at discharge). The patient would benefit from further skilled PT intervention to continue to progress towards goals.    If plan is discharge home, recommend the following: A little help with walking and/or transfers;A little help with bathing/dressing/bathroom;Assist for transportation;Help with stairs or ramp for entrance   Can travel by private vehicle        Equipment Recommendations  None recommended by PT    Recommendations for Other Services       Precautions / Restrictions Precautions Precautions: Fall Recall of Precautions/Restrictions: Intact Precaution/Restrictions Comments: ostomy Restrictions Weight Bearing Restrictions Per Provider Order: No     Mobility  Bed Mobility               General bed mobility comments: pt up in recliner at start/end of session    Transfers Overall transfer level: Needs assistance Equipment used: Rolling walker (2 wheels) Transfers: Sit to/from Stand Sit to Stand: Modified independent (Device/Increase time)                Ambulation/Gait Ambulation/Gait assistance: Supervision Gait Distance (Feet): 400 Feet Assistive device: Rolling walker (2 wheels)             Stairs Stairs:  Yes Stairs assistance: Contact guard assist, Min assist Stair Management: One rail Right Number of Stairs: 6 General stair comments: able to use railing with CGA, but also with handheld minA at pt request to attempt in case that is the help she needs at home   Wheelchair Mobility     Tilt Bed    Modified Rankin (Stroke Patients Only)       Balance Overall balance assessment: Needs assistance Sitting-balance support: Feet supported Sitting balance-Leahy Scale: Good     Standing balance support: Bilateral upper extremity supported, Reliant on assistive device for balance, During functional activity Standing balance-Leahy Scale: Good                              Communication    Cognition Arousal: Alert Behavior During Therapy: WFL for tasks assessed/performed   PT - Cognitive impairments: No apparent impairments                                Cueing    Exercises      General Comments        Pertinent Vitals/Pain Pain Assessment Pain Assessment: No/denies pain    Home Living                          Prior Function            PT Goals (current goals can now be  found in the care plan section) Progress towards PT goals: Progressing toward goals    Frequency    Min 2X/week      PT Plan      Co-evaluation              AM-PAC PT "6 Clicks" Mobility   Outcome Measure  Help needed turning from your back to your side while in a flat bed without using bedrails?: None Help needed moving from lying on your back to sitting on the side of a flat bed without using bedrails?: None Help needed moving to and from a bed to a chair (including a wheelchair)?: None Help needed standing up from a chair using your arms (e.g., wheelchair or bedside chair)?: None Help needed to walk in hospital room?: None Help needed climbing 3-5 steps with a railing? : A Little 6 Click Score: 23    End of Session   Activity Tolerance:  Patient tolerated treatment well Patient left: in chair;with call bell/phone within reach Nurse Communication: Mobility status PT Visit Diagnosis: Muscle weakness (generalized) (M62.81);Unsteadiness on feet (R26.81)     Time: 1191-4782 PT Time Calculation (min) (ACUTE ONLY): 15 min  Charges:    $Therapeutic Activity: 8-22 mins PT General Charges $$ ACUTE PT VISIT: 1 Visit                     Darien Eden PT, DPT 10:21 AM,03/11/24

## 2024-03-12 DIAGNOSIS — R6521 Severe sepsis with septic shock: Secondary | ICD-10-CM | POA: Diagnosis not present

## 2024-03-12 LAB — CBC
HCT: 24.6 % — ABNORMAL LOW (ref 36.0–46.0)
Hemoglobin: 8.1 g/dL — ABNORMAL LOW (ref 12.0–15.0)
MCH: 32.7 pg (ref 26.0–34.0)
MCHC: 32.9 g/dL (ref 30.0–36.0)
MCV: 99.2 fL (ref 80.0–100.0)
Platelets: 111 10*3/uL — ABNORMAL LOW (ref 150–400)
RBC: 2.48 MIL/uL — ABNORMAL LOW (ref 3.87–5.11)
RDW: 23.3 % — ABNORMAL HIGH (ref 11.5–15.5)
WBC: 4.2 10*3/uL (ref 4.0–10.5)
nRBC: 0 % (ref 0.0–0.2)

## 2024-03-12 LAB — MAGNESIUM: Magnesium: 2.1 mg/dL (ref 1.7–2.4)

## 2024-03-12 LAB — BASIC METABOLIC PANEL WITH GFR
Anion gap: 10 (ref 5–15)
BUN: 27 mg/dL — ABNORMAL HIGH (ref 8–23)
CO2: 27 mmol/L (ref 22–32)
Calcium: 9 mg/dL (ref 8.9–10.3)
Chloride: 101 mmol/L (ref 98–111)
Creatinine, Ser: 0.82 mg/dL (ref 0.44–1.00)
GFR, Estimated: >60 mL/min (ref >60)
Glucose, Bld: 133 mg/dL — ABNORMAL HIGH (ref 70–99)
Potassium: 3.7 mmol/L (ref 3.5–5.1)
Sodium: 138 mmol/L (ref 135–145)

## 2024-03-12 LAB — GLUCOSE, CAPILLARY
Glucose-Capillary: 143 mg/dL — ABNORMAL HIGH (ref 70–99)
Glucose-Capillary: 172 mg/dL — ABNORMAL HIGH (ref 70–99)

## 2024-03-12 LAB — PHOSPHORUS: Phosphorus: 3.6 mg/dL (ref 2.5–4.6)

## 2024-03-12 MED ORDER — MIDODRINE HCL 10 MG PO TABS: 10.0000 mg | ORAL_TABLET | Freq: Three times a day (TID) | ORAL | 2 refills | Status: DC

## 2024-03-12 MED ORDER — POLYSACCHARIDE IRON COMPLEX 150 MG PO CAPS
150.0000 mg | ORAL_CAPSULE | Freq: Every day | ORAL | Status: DC
  Filled 2024-03-12: qty 1

## 2024-03-12 MED ORDER — ASCORBIC ACID 500 MG PO TABS: 500.0000 mg | ORAL_TABLET | Freq: Every day | ORAL | 2 refills | Status: DC

## 2024-03-12 MED ORDER — VITAMIN C 500 MG PO TABS: 500.0000 mg | ORAL_TABLET | Freq: Every day | ORAL | Status: DC

## 2024-03-12 MED ORDER — POLYSACCHARIDE IRON COMPLEX 150 MG PO CAPS: 150.0000 mg | ORAL_CAPSULE | Freq: Every day | ORAL | 2 refills | Status: DC

## 2024-03-12 MED ORDER — TORSEMIDE 40 MG PO TABS: 40.0000 mg | ORAL_TABLET | Freq: Two times a day (BID) | ORAL | 0 refills | Status: DC

## 2024-03-12 NOTE — Progress Notes (Signed)
 [facility] SURGICAL PROGRESS NOTE  Hospital Day(s): 14.   Post op day(s): 13 Days Post-Op.   Interval History:  Patient seen and examined No acute events or new complaints overnight.  Patient reports she is doing well No abdominal pain, nausea, emesis Up walking in the room Labs reviewed; reassuring Tolerating diet Ostomy functioning; managing well   Vital signs in last 24 hours: [min-max] current  Temp:  [98.3 F (36.8 C)-99 F (37.2 C)] 98.3 F (36.8 C) (04/15 0039) Pulse Rate:  [80-92] 92 (04/15 0039) Resp:  [16-18] 16 (04/15 0039) BP: (105-107)/(64-69) 107/64 (04/15 0039) SpO2:  [97 %-98 %] 98 % (04/15 0039)     Height: 5\' 1"  (154.9 cm) Weight: 62.1 kg BMI (Calculated): 25.9   Intake/Output last 2 shifts:  04/14 0701 - 04/15 0700 In: 360 [P.O.:360] Out: 1850 [Urine:1350; Stool:500]   Physical Exam:  Constitutional: Sitting up in chair, NAD Respiratory: breathing non-labored, no respiratory distress Gastrointestinal: soft, non-tender, non-distended, no rebound/guarding. Ileostomy in RLQ; loose stool in bag  Integumentary: Laparotomy is CDI with staples (removed), no erythema or drainage   Labs:     Latest Ref Rng & Units 03/12/2024    4:40 AM 03/11/2024   10:15 AM 03/10/2024    5:10 AM  CBC  WBC 4.0 - 10.5 K/uL 4.2  6.2  5.6   Hemoglobin 12.0 - 15.0 g/dL 8.1  8.5  7.8   Hematocrit 36.0 - 46.0 % 24.6  26.3  22.5   Platelets 150 - 400 K/uL 111  121  111       Latest Ref Rng & Units 03/12/2024    4:40 AM 03/11/2024   10:15 AM 03/10/2024    5:10 AM  CMP  Glucose 70 - 99 mg/dL 161  096  86   BUN 8 - 23 mg/dL 27  28  27    Creatinine 0.44 - 1.00 mg/dL 0.45  4.09  8.11   Sodium 135 - 145 mmol/L 138  138  135   Potassium 3.5 - 5.1 mmol/L 3.7  4.0  3.7   Chloride 98 - 111 mmol/L 101  100  99   CO2 22 - 32 mmol/L 27  27  26    Calcium 8.9 - 10.3 mg/dL 9.0  8.9  8.7     Imaging studies: No new pertinent imaging studies   Assessment/Plan:  68  y.o. female 13 Days Post-Op s/p exploratory laparotomy, total colectomy, and end ileostomy creation for septic shock secondary to C diff colitis    - Staples removed              - Continue diet as tolerated  - Monitor abdominal examination - Monitor ileostomy output; she understands to monitor consistency of this, carries risk of dehydration             - Pain control prn; antiemetics prn - WOC RN for ostomy care/teaching - place outpatient referral as well              - Working with therapies             - Further management per primary service               - Discharge Planning: Staples removed. Otherwise, she has done extremely well from surgical perspective. We will follow up in 3-4 weeks as outpatient. She understands to call with questions/concerns. Also placed outpatient ostomy clinic referral.   All of the above findings and recommendations were discussed  with the patient and the medical team.   -- Apolonio Bay, PA-C [facility] 03/12/2024, 8:00 AM M-F: 7am - 4pm

## 2024-03-12 NOTE — Discharge Summary (Signed)
 [facility] Discharge Summary   Patient: Tammy Arias NFA:[id number]  PCP: Lyle San, MD  Date of admission: 02/27/2024   Date of discharge:  03/12/2024     Discharge Diagnoses:  Principal Problem:   Severe sepsis with septic shock (CODE) (HCC) Active Problems:   Pancolitis (HCC)   Clostridium difficile colitis   Sepsis with acute renal failure and septic shock (HCC)   Admitted From: Home Disposition:  Home with [facility]  Recommendations for Outpatient Follow-up:  F/u with PCP in 1 wk, monitor BP at home and titrate meds F/u Sx in 3-4 weeks for post op check F/u wuth Heme/onc, will need Iron IV infusion Started midodrine 10 mg p.o. 3 times daily, skip the dose if systolic BP greater than 120 mmHg  Follow up LABS/TEST:     Follow-up Information     Antonette Batters, MD Follow up on 03/20/2024.   Specialties: Cardiology, Internal Medicine Why: Go @ 9:30am. Contact information: [address] [place] [address] [phone]         [facility], [facility] Follow up.   Why: 04/08--Per Bartholomew Light, within 48-72 of discharge, will continue to follow. Contact information: [address] [place] [address] [phone]         [facility] Follow up.   Why: 04/08--per [name], will process 3 in 1 BSC Contact information: [address] [address] [phone]         [facility]. Call.   Specialty: General Surgery Why: As needed Contact information: [address] Entrance [address] [place]  [address] [phone]        Flynn Hylan, MD. Go on 04/04/2024.   Specialty: General Surgery Why: Go to appointment on 05/08 at 900 AM Contact information: [address] [place] [address] [phone]         Lyle San, MD Follow up on 03/19/2024.   Specialty: Family Medicine Why: Go @ 2:45pm. Contact information: [address] [facility] [place] [address] [phone]                Diet recommendation: Cardiac and Carb modified diet  Activity: The patient is advised to gradually reintroduce usual activities, as tolerated  Discharge Condition: stable  Code Status: Full code   History of present illness: As per the H and P dictated on admission Hospital Course:  68 yo F presenting to [facility] ED from home for evaluation of diarrhea and vomiting since 02/25/24.   History obtained per chart review and patient bedside report. Patient has had multiple hospitalizations since January 2025 with GI bleeding s/p clipping, watchman device placement and most recently acute CHF exacerbation from 01/26/24- 02/13/24.  During her most recent admission, she required loop diuretics as well as inotrope assistance for adequate diuresis. She was changed from entresto to valsartan due to hypotension and her BB was stopped. She was continued on Eliquis post watchman device per EP recommendations after 6 week TEE on 01/23/24 noted a small flow jet from the LAA back into the LA proper at the edge of the inferior aspect of the occluder device.  She also tested positive for C.Diff completing a 10 day course of PO vancomycin for treatment on 02/10/24.   After discharge she was in her normal state of health until noticing recurrent diarrhea on 02/17/24, which she thought might just be an IBS flare. Diarrhea however worsened on 02/21/24 which got  much worse on 3/28 with the addition of persistent non-bloody bilious emesis. Her best friend, who has been helping care for her stated she noticed some blood in her stool that seemed to be from the patient's hemorrhoids. However this bleeding significantly increased on 02/27/24 and the patient has had at least 2 bloody bowel movements. The patient also endorses generalized weakness and fatigue, diffuse abdominal tenderness, blurred vision with green spots. She denies chest pain, dyspnea, urinary  symptoms, fever/chills, LOC or palpitations. She did fall about 3 days ago, landing on her buttocks, she denies hitting her head.   She reports continuing to take all her prescribed medications including her valsartan, torsemide and Eliquis.   ED course: Upon arrival patient significantly hypotensive and drowsy. Sepsis protocol initiated and due to sever hypotension, central venous catheter placed and vasopressor started. Labs significant for hyponatremia, hypochloremia, AGMA, AKI on CKD, elevated Alk Phos, leukocytosis and significant lactic acidosis with fecal occult positive. Small amount of hematochezia noted in ED. Imaging significant for pan colitis without signs of toxic megacolon and cholelithiasis without cholecystis.   Medications given: cefepime/vancomycin/flagyl, levophed drip, 2.75 L IVF bolus Initial Vitals: 97.4, 15, 90, 62/35 & 100% on RA  C.Diff testing: Positive   CXR 02/27/24: no acute cardiopulmonary disease CT abdomen/pelvis wo contrast 02/27/24: Diffuse colonic wall thickening and surrounding inflammation compatible with pancolitis. Cholelithiasis.   PCCM consulted for admission due to severe sepsis with shock secondary to recurrent C.Diff colitis requiring vasopressor support.   02/27/24: Admit to ICU with severe sepsis and shock secondary to recurrent C.Diff colitis requiring vasopressor support. 02/28/24: on two pressors, rising lactic acidosis, surgery consulted, underwent colectomy 02/29/24: awake and follows commands off sedation, vasopressor requirements improved, extubated 03/01/24: remains weak but continues to improve, on low dose nor-epinephrine 03/02/24: pressor requirements improving, denies pain, tolerating sips 03/03/24: Vasopressor requirements continue to downtrend.  Tolerating clear liquids.   Patient was transferred to [facility] service on 03/04/2024   Assessment and Plan:   # Septic shock due to C. difficile colitis S/p vasopressor, gradually weaned off.  4/8 increased  midodrine 10 mg p.o. 3 times daily.  Patient was advised to monitor BP at home and skip the dose if systolic BP greater than 120 mmHg   # C. 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 Staples on POD 14 (04/16) but since pt remained in the hospital so staples removed today on 4/15.   S/p antibiotics. Started diet, tolerating well   # Hypomagnesemia, mag repleted.  Resolved # Hypophosphatemia due to nutritional deficiency. Phos repleted.  Resolved # Hyperkalemia, mild, resolved after Lokelma 10 g one-time dose given # Hyponatremia, monitor sodium level daily # Respiratory failure postop, required ventilator S/p intubation, successfully extubated.  Currently saturating well on room air. # AKI and metabolic acidosis: Cr 4.17--->0.78, AKI resolved # Acute on chronic systolic CHF exacerbation and right ventricular failure #Atrial fibrillation  #s/p Watchman procedure (11/2023) #CAD status post CABG S/p heparin IV infusion, transition to Eliquis Hold GDMT for now due to hypotension, resume when BP stable 4/7 Lasix 40 mg one-time dose given due to shortness of breath 4/10 started Lasix 20 mg IV every 6 hourly, slow diuresis due to soft blood pressure 4/11 BNP 936 elevated and TTE shows LVEF 35 to 40%, moderately reduced RV systolic function, moderate PAH, severely enlarged left and right atrium, severe MR,  severe TR 4/11 increased Lasix 20 mg IV every 4 hourly 4/12 increased to Lasix 40 mg IV every 6 hours. Lower extremity edema significantly improved.  Started torsemide 40 mg p.o. twice daily for 3 days followed by 20 mg p.o. twice daily home dose.   # Acute blood loss anemia and iron deficiency Tsat 10%.  Started Niferex 150 mg p.o. daily.  Patient is on IV iron infusion as an outpatient.  Recommended to follow-up with  hematology. # IDDM T2: Hyperglycemia 4/13 noticed early morning hypoglycemic episodes, decreased Semglee from 20 to 15 units nightly. S/p NovoLog sliding scale, monitor CBG, continue diabetic diet.  # Hypothyroid, continue Synthroid   Body mass index is 25.89 kg/m.  Nutrition Problem: Inadequate oral intake Etiology: acute illness Nutrition Interventions:  Patient was seen by physical therapy, who recommended Home health, which was arranged. On the day of the discharge the patient's vitals were stable, and no other acute medical condition were reported by patient. the patient was felt safe to be discharge at Home with Home health.  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 Cardiovascular: S1 and S2 Present, no Murmur, Respiratory: normal respiratory effort, Bilateral Air entry present and no Crackles, no wheezes Abdomen: Bowel Sound present, Soft and mild postop tenderness, no hernia Extremities: 2-3+ pedal edema, no calf tenderness Neurology: alert and oriented to time, place, and person affect appropriate.  Filed Weights   03/09/24 0500 03/10/24 0500 03/11/24 0429  Weight: 64.8 kg 63.7 kg 62.1 kg   Vitals:   03/12/24 0039 03/12/24 0823  BP: 107/64 114/69  Pulse: 92 82  Resp: 16 18  Temp: 98.3 F (36.8 C) 97.6 F (36.4 C)  SpO2: 98% 100%    DISCHARGE MEDICATION: Allergies as of 03/12/2024       Reactions   Vioxx [rofecoxib] Swelling        Medication List     PAUSE taking these medications    cyanocobalamin 1000 MCG tablet Wait to take this until your doctor or other care provider tells you to start again. Commonly known as: VITAMIN B12 Take 1,000 mcg by mouth daily.   valsartan 40 MG tablet Wait to take this until your doctor or other care provider tells you to start again. Commonly known as: DIOVAN Take 20  mg by mouth 2 (two) times daily.       TAKE these medications    albuterol 108 (90 Base) MCG/ACT inhaler Commonly known as: VENTOLIN HFA Inhale 2 puffs into the lungs every 6 (six) hours as needed for wheezing or shortness of breath.   allopurinol 300 MG tablet Commonly known as: ZYLOPRIM Take 300 mg by mouth daily.   ascorbic acid 500 MG tablet Commonly known as: VITAMIN C Take 1 tablet (500 mg total) by mouth daily.   colchicine 0.6 MG tablet Take 0.6-1.8 mg by mouth as needed (take 1.2 mg (2 tablets) at onset of gout flare, Take one additinaltablet one hour later if symptoms persist. Maximum of 3 tablets per day).   Eliquis 5 MG Tabs tablet Generic drug: apixaban Take 5 mg by mouth 2 (two) times daily.   iron polysaccharides 150 MG capsule Commonly known as: NIFEREX Take 1 capsule (150 mg total) by mouth daily.   Jardiance 10 MG Tabs tablet Generic drug: empagliflozin Take 10 mg by mouth daily.   latanoprost 0.005 % ophthalmic solution Commonly  known as: XALATAN Place 1 drop into both eyes at bedtime.   levothyroxine 50 MCG tablet Commonly known as: SYNTHROID Take 50 mcg by mouth daily before breakfast.   metFORMIN 1000 MG tablet Commonly known as: GLUCOPHAGE Take 1,000 mg by mouth 2 (two) times daily with a meal.   midodrine 10 MG tablet Commonly known as: PROAMATINE Take 1 tablet (10 mg total) by mouth 3 (three) times daily with meals.   pantoprazole 40 MG tablet Commonly known as: PROTONIX Take 40 mg by mouth daily.   rosuvastatin 5 MG tablet Commonly known as: CRESTOR Take 5 mg by mouth daily.   spironolactone 25 MG tablet Commonly known as: ALDACTONE Take 25 mg by mouth daily.   sucralfate 1 g tablet Commonly known as: CARAFATE Take 1 g by mouth 4 (four) times daily -  with meals and at bedtime.   Torsemide 40 MG Tabs Take 40 mg by mouth 2 (two) times daily for 3 days. What changed: You were already taking a medication with the same name,  and this prescription was added. Make sure you understand how and when to take each.   torsemide 20 MG tablet Commonly known as: DEMADEX Take 1 tablet (20 mg total) by mouth 2 (two) times daily. Start taking on: March 16, 2024 What changed: These instructions start on March 16, 2024. If you are unsure what to do until then, ask your doctor or other care provider.               Durable Medical Equipment  (From admission, onward)           Start     Ordered   03/05/24 1105  For home use only DME 3 n 1  Once        03/05/24 1104           Allergies  Allergen Reactions   Vioxx [Rofecoxib] Swelling   Discharge Instructions     Call MD for:   Complete by: As directed    Lower extremity edema   Call MD for:  difficulty breathing, headache or visual disturbances   Complete by: As directed    Call MD for:  extreme fatigue   Complete by: As directed    Call MD for:  persistant dizziness or light-headedness   Complete by: As directed    Call MD for:  persistant nausea and vomiting   Complete by: As directed    Call MD for:  severe uncontrolled pain   Complete by: As directed    Call MD for:  temperature >100.4   Complete by: As directed    Diet - low sodium heart healthy   Complete by: As directed    Diet Carb Modified   Complete by: As directed    Discharge instructions   Complete by: As directed    F/u with PCP in 1 wk, monitor BP at home and titrate meds F/u Sx in 3-4 weeks for post op check F/u wuth Heme/onc, will need Iron IV infusion Started midodrine 10 mg p.o. 3 times daily, skip the dose if systolic BP greater than 120 mmHg   Increase activity slowly   Complete by: As directed        The results of significant diagnostics from this hospitalization (including imaging, microbiology, ancillary and laboratory) are listed below for reference.    Significant Diagnostic Studies: ECHOCARDIOGRAM COMPLETE Result Date: 03/07/2024    ECHOCARDIOGRAM REPORT    Patient Name:   Tammy Arias  Hartman Date of Exam: 03/07/2024 Medical Rec #:  [id number]       Height:       61.0 in Accession #:    [id number]      Weight:       143.1 lb Date of Birth:  1956-09-19       BSA:          1.638 m Patient Age:    68 years        BP:           104/68 mmHg Patient Gender: F               HR:           99 bpm. Exam Location:  [facility] Procedure: 2D Echo, Cardiac Doppler and Color Doppler (Both Spectral and Color            Flow Doppler were utilized during procedure). Indications:     CHF--acute systolic I50.21  History:         Patient has no prior history of Echocardiogram examinations.                  CHF, Arrythmias:Atrial Fibrillation; Risk Factors:Hypertension.                  Migraines.  Sonographer:     Cristela Blue Referring Phys:  [id number] Shainna Faux Diagnosing Phys: Mellody Drown Alluri IMPRESSIONS  1. Left ventricular ejection fraction, by estimation, is 35 to 40%. The left ventricle has moderately decreased function. The left ventricle demonstrates global hypokinesis. Left ventricular diastolic parameters are indeterminate.  2. Right ventricular systolic function is moderately reduced. The right ventricular size is mildly enlarged. There is moderately elevated pulmonary artery systolic pressure. The estimated right ventricular systolic pressure is 55.9 mmHg.  3. Left atrial size was severely dilated.  4. Right atrial size was severely dilated.  5. The mitral valve is grossly normal. Severe mitral valve regurgitation.  6. Tricuspid valve regurgitation is severe.  7. The aortic valve was not well visualized. Aortic valve regurgitation is trivial. FINDINGS  Left Ventricle: Left ventricular ejection fraction, by estimation, is 35 to 40%. The left ventricle has moderately decreased function. The left ventricle demonstrates global hypokinesis. The left ventricular internal cavity size was normal in size. There is no left ventricular hypertrophy. Left ventricular diastolic parameters are  indeterminate. Right Ventricle: The right ventricular size is mildly enlarged. No increase in right ventricular wall thickness. Right ventricular systolic function is moderately reduced. There is moderately elevated pulmonary artery systolic pressure. The tricuspid regurgitant velocity is 3.46 m/s, and with an assumed right atrial pressure of 8 mmHg, the estimated right ventricular systolic pressure is 55.9 mmHg. Left Atrium: Left atrial size was severely dilated. Right Atrium: Right atrial size was severely dilated. Pericardium: There is no evidence of pericardial effusion. Mitral Valve: The mitral valve is grossly normal. There is mild thickening of the mitral valve leaflet(s). Severe mitral valve regurgitation. Tricuspid Valve: The tricuspid valve is grossly normal. Tricuspid valve regurgitation is severe. Aortic Valve: The aortic valve was not well visualized. Aortic valve regurgitation is trivial. Aortic valve mean gradient measures 3.3 mmHg. Aortic valve peak gradient measures 5.6 mmHg. Aortic valve area, by VTI measures 1.67 cm. Pulmonic Valve: The pulmonic valve was not well visualized. Pulmonic valve regurgitation is trivial. Aorta: The aortic root is normal in size and structure. IAS/Shunts: The atrial septum is grossly normal.  LEFT VENTRICLE PLAX 2D LVIDd:  5.30 cm LVIDs:         4.50 cm LV PW:         1.10 cm LV IVS:        0.90 cm LVOT diam:     2.00 cm LV SV:         36 LV SV Index:   22 LVOT Area:     3.14 cm  RIGHT VENTRICLE RV Basal diam:  4.60 cm RV Mid diam:    4.00 cm RV S prime:     6.74 cm/s TAPSE (M-mode): 1.1 cm LEFT ATRIUM             Index        RIGHT ATRIUM           Index LA diam:        5.60 cm 3.42 cm/m   RA Area:     26.50 cm LA Vol (A2C):   64.4 ml 39.31 ml/m  RA Volume:   96.00 ml  58.60 ml/m LA Vol (A4C):   77.8 ml 47.49 ml/m LA Biplane Vol: 71.8 ml 43.83 ml/m  AORTIC VALVE AV Area (Vmax):    1.54 cm AV Area (Vmean):   1.64 cm AV Area (VTI):     1.67 cm AV Vmax:            118.63 cm/s AV Vmean:          81.233 cm/s AV VTI:            0.216 m AV Peak Grad:      5.6 mmHg AV Mean Grad:      3.3 mmHg LVOT Vmax:         58.30 cm/s LVOT Vmean:        42.400 cm/s LVOT VTI:          0.115 m LVOT/AV VTI ratio: 0.53  AORTA Ao Root diam: 2.60 cm MITRAL VALVE                TRICUSPID VALVE MV Area (PHT): 4.36 cm     TR Peak grad:   47.9 mmHg MV Decel Time: 174 msec     TR Vmax:        346.00 cm/s MV E velocity: 147.00 cm/s                             SHUNTS                             Systemic VTI:  0.12 m                             Systemic Diam: 2.00 cm Joetta Mustache Electronically signed by Joetta Mustache Signature Date/Time: 03/07/2024/5:26:18 PM    Final    DG Chest Port 1 View Result Date: 02/28/2024 CLINICAL DATA:  Intubated EXAM: PORTABLE CHEST 1 VIEW COMPARISON:  02/27/2024 FINDINGS: Single frontal view of the chest demonstrates endotracheal tube overlying tracheal air column, tip just below thoracic inlet. Enteric catheter passes below diaphragm, tip and side port projecting over the gastric fundus. Right internal jugular catheter tip overlies the right atrium. Abandoned epicardial pacing wires are noted. Occlusion device in the region of the left atrial appendage unchanged. Cardiac silhouette is mildly enlarged but stable. No acute airspace disease, effusion, or pneumothorax. No acute bony abnormalities. IMPRESSION: 1.  Support devices as above. 2. No acute airspace disease. Electronically Signed   By: Sharlet Salina M.D.   On: 02/28/2024 17:05   CT ABDOMEN PELVIS WO CONTRAST Result Date: 02/27/2024 CLINICAL DATA:  Diarrhea, sepsis, vomiting EXAM: CT ABDOMEN AND PELVIS WITHOUT CONTRAST TECHNIQUE: Multidetector CT imaging of the abdomen and pelvis was performed following the standard protocol without IV contrast. RADIATION DOSE REDUCTION: This exam was performed according to the departmental dose-optimization program which includes automated exposure control, adjustment of  the mA and/or kV according to patient size and/or use of iterative reconstruction technique. COMPARISON:  06/06/2023 FINDINGS: Lower chest: Cardiomegaly. Minimal right base linear atelectasis or scarring. No effusions. Hepatobiliary: Layering gallstones within the gallbladder, stable. No focal hepatic abnormality or biliary ductal dilatation. Pancreas: No focal abnormality or ductal dilatation. Spleen: No focal abnormality.  Normal size. Adrenals/Urinary Tract: No adrenal abnormality. No focal renal abnormality. No stones or hydronephrosis. Urinary bladder is unremarkable. Stomach/Bowel: Diffuse colonic wall thickening and surrounding inflammation compatible with pancolitis. Stomach and small bowel decompressed. No bowel obstruction. Vascular/Lymphatic: No evidence of aneurysm or adenopathy. Aortic atherosclerosis. Reproductive: Uterus and adnexa unremarkable.  No mass. Other: No free fluid or free air. Musculoskeletal: No acute bony abnormality. IMPRESSION: Diffuse colonic wall thickening and surrounding inflammation compatible with pancolitis. Cholelithiasis. Aortic atherosclerosis. Electronically Signed   By: Charlett Nose M.D.   On: 02/27/2024 21:42   DG Chest Port 1 View Result Date: 02/27/2024 CLINICAL DATA:  Central line placement EXAM: PORTABLE CHEST 1 VIEW COMPARISON:  12/17/2018 FINDINGS: Right central line tip in the right atrium. No pneumothorax. Prior median sternotomy. Heart is borderline in size. No confluent airspace opacities or effusions. No acute bony abnormality. IMPRESSION: Right central line tip in the right atrium.  No pneumothorax. No acute cardiopulmonary disease. Electronically Signed   By: Charlett Nose M.D.   On: 02/27/2024 20:36    Microbiology: No results found for this or any previous visit (from the past 240 hours).   Labs: CBC: Recent Labs  Lab 03/08/24 0452 03/09/24 0526 03/10/24 0510 03/11/24 1015 03/12/24 0440  WBC 8.4 6.3 5.6 6.2 4.2  HGB 7.8* 8.1* 7.8* 8.5* 8.1*   HCT 23.1* 24.2* 22.5* 26.3* 24.6*  MCV 96.7 98.4 99.1 101.5* 99.2  PLT 127* 120* 111* 121* 111*   Basic Metabolic Panel: Recent Labs  Lab 03/08/24 0452 03/09/24 0526 03/10/24 0510 03/11/24 1015 03/12/24 0440  NA 130* 136 135 138 138  K 4.6 4.4 3.7 4.0 3.7  CL 100 100 99 100 101  CO2 21* 27 26 27 27   GLUCOSE 98 98 86 230* 133*  BUN 27* 27* 27* 28* 27*  CREATININE 0.81 0.89 0.82 0.92 0.82  CALCIUM 8.9 8.7* 8.7* 8.9 9.0  MG 1.7 1.6* 1.8 1.6* 2.1  PHOS 3.7 4.1 3.7 3.0 3.6   Liver Function Tests: Recent Labs  Lab 03/06/24 0407  ALBUMIN 2.9*   No results for input(s): "LIPASE", "AMYLASE" in the last 168 hours. No results for input(s): "AMMONIA" in the last 168 hours. Cardiac Enzymes: No results for input(s): "CKTOTAL", "CKMB", "CKMBINDEX", "TROPONINI" in the last 168 hours. BNP (last 3 results) Recent Labs    02/20/24 1127 02/28/24 0156 03/07/24 0507  BNP 199.8* 579.8* 936.6*   CBG: Recent Labs  Lab 03/11/24 1137 03/11/24 1601 03/11/24 2041 03/12/24 0038 03/12/24 0824  GLUCAP 205* 144* 243* 172* 143*    Time spent: 35 minutes  Signed:  Gillis Santa  [facility] 03/12/2024 1:00 PM

## 2024-03-12 NOTE — Consult Note (Signed)
 WOC Nurse ostomy follow up Stoma type/location: end ileostomy  Stomal assessment/size: 1 3/4" pink, moist, budded. Visualized through pouch, just changed last pm by the bedside nursing staff. Apparently issues with leaking towards the umbilicus, staff not taking time to change, however patient and CG report night nurse took time to reapply. We discussed using barrier extenders because she is tending to leak towards the umbilicus.  Also apparently the telebox was hitting the pouch Peristomal assessment:  Treatment options for stomal/peristomal skin: 2" barrier ring  Output yellow/brown Ostomy pouching: 2pc 2 1/4 in place however I have provided 2 3/4" and 2" barrier rings for DC to home  Education provided:  Allowed patient's CG to go over the steps at the bedside, including frequency of change, cleaning skin, applying barrier ring, CG cut new skin barrier and talked through the remainder of the steps. Encouraged CG and patient to put the pouch on going down the leg to allow for easier emptying   Enrolled patient in [place] Secure Start Discharge program: Yes  WOC Nurse will follow along with you for continued support with ostomy teaching and care Tyreck Bell [facility] MSN, RN, [place], CNS, [place] [phone]

## 2024-03-12 NOTE — Plan of Care (Signed)

## 2024-03-12 NOTE — Progress Notes (Signed)
Discharge teaching complete. Meds, diet, activity, follow up appointments and incision care reviewed and all questions answered. Copy of instructions given to patient and prescriptions sent to pharmacy.  

## 2024-03-12 NOTE — TOC Transition Note (Signed)
 Transition of Care [facility]) - Discharge Note   Patient Details  Name: Tammy Arias MRN: [id number] Date of Birth: [birth year]  Transition of [facility] CM/SW Contact:  Loman Risk, RN Phone Number: 03/12/2024, 12:01 PM   Clinical Narrative:     Patient to discharge today [facility] notified notified of discharge BSC was delivered last week   Final next level of care: Home w Home Health Services Barriers to Discharge: Continued Medical Work up   Patient Goals and CMS Choice     Choice offered to / list presented to : Patient      Discharge Placement                       Discharge Plan and Services Additional resources added to the After Visit Summary for     Discharge Planning Services: CM Consult Post Acute Care Choice: Durable Medical Equipment, Home Health          DME Arranged: 3-N-1 DME Agency: [employer] Date DME Agency Contacted: 03/05/24 Time DME Agency Contacted: 1100 Representative spoke with at DME Agency: Harriet Limber HH Arranged: PT, OT HH Agency: [facility] Date [facility] Agency Contacted: 03/05/24 Time HH Agency Contacted: 1059 Representative spoke with at [facility] Agency: Bartholomew Light  Social Drivers of Health (SDOH) Interventions SDOH Screenings   Food Insecurity: No Food Insecurity (02/28/2024)  Recent Concern: Food Insecurity - Food Insecurity Present (12/13/2023)   Received from [facility]  Housing: High Risk (02/28/2024)  Transportation Needs: No Transportation Needs (02/28/2024)  Utilities: Not At Risk (02/28/2024)  Financial Resource Strain: Low Risk  (02/11/2024)   Received from [facility]  Recent Concern: Financial Resource Strain - Medium Risk (12/13/2023)   Received from [facility]  Social Connections: Moderately Integrated (02/28/2024)  Tobacco Use: Medium Risk (02/28/2024)     Readmission Risk Interventions    03/01/2024   12:07 PM 02/29/2024   11:10 AM  Readmission Risk  Prevention Plan  Transportation Screening Complete   PCP or Specialist Appt within 3-5 Days Complete Complete  HRI or Home Care Consult Complete Complete  Social Work Consult for Recovery Care Planning/Counseling Complete Complete  Palliative Care Screening Not Applicable Not Applicable  Medication Review Oceanographer) Complete

## 2024-03-12 NOTE — Progress Notes (Signed)
 Waiting for Dr. Hubert Madden to complete med rec for discharge.

## 2024-03-17 ENCOUNTER — Other Ambulatory Visit: Payer: Self-pay

## 2024-03-17 ENCOUNTER — Inpatient Hospital Stay
Admission: EM | Admit: 2024-03-17 | Discharge: 2024-03-19 | DRG: 683 | Disposition: A | Discharge status: Home Health | Attending: Internal Medicine | Admitting: Internal Medicine

## 2024-03-17 ENCOUNTER — Emergency Department

## 2024-03-17 DIAGNOSIS — R112 Nausea with vomiting, unspecified: Secondary | ICD-10-CM

## 2024-03-17 DIAGNOSIS — Z7901 Long term (current) use of anticoagulants: Secondary | ICD-10-CM

## 2024-03-17 DIAGNOSIS — G4733 Obstructive sleep apnea (adult) (pediatric): Secondary | ICD-10-CM

## 2024-03-17 DIAGNOSIS — I255 Ischemic cardiomyopathy: Secondary | ICD-10-CM | POA: Diagnosis present

## 2024-03-17 DIAGNOSIS — Z79899 Other long term (current) drug therapy: Secondary | ICD-10-CM

## 2024-03-17 DIAGNOSIS — Z8049 Family history of malignant neoplasm of other genital organs: Secondary | ICD-10-CM

## 2024-03-17 DIAGNOSIS — N179 Acute kidney failure, unspecified: Principal | ICD-10-CM | POA: Diagnosis present

## 2024-03-17 DIAGNOSIS — Z96651 Presence of right artificial knee joint: Secondary | ICD-10-CM | POA: Diagnosis present

## 2024-03-17 DIAGNOSIS — I700 Atherosclerosis of aorta: Secondary | ICD-10-CM | POA: Diagnosis present

## 2024-03-17 DIAGNOSIS — K7460 Unspecified cirrhosis of liver: Secondary | ICD-10-CM | POA: Diagnosis present

## 2024-03-17 DIAGNOSIS — D509 Iron deficiency anemia, unspecified: Secondary | ICD-10-CM | POA: Diagnosis present

## 2024-03-17 DIAGNOSIS — Z888 Allergy status to other drugs, medicaments and biological substances status: Secondary | ICD-10-CM

## 2024-03-17 DIAGNOSIS — Z9841 Cataract extraction status, right eye: Secondary | ICD-10-CM

## 2024-03-17 DIAGNOSIS — Z7984 Long term (current) use of oral hypoglycemic drugs: Secondary | ICD-10-CM

## 2024-03-17 DIAGNOSIS — Z79890 Hormone replacement therapy (postmenopausal): Secondary | ICD-10-CM

## 2024-03-17 DIAGNOSIS — Z8249 Family history of ischemic heart disease and other diseases of the circulatory system: Secondary | ICD-10-CM

## 2024-03-17 DIAGNOSIS — M109 Gout, unspecified: Secondary | ICD-10-CM | POA: Diagnosis present

## 2024-03-17 DIAGNOSIS — K219 Gastro-esophageal reflux disease without esophagitis: Secondary | ICD-10-CM | POA: Diagnosis present

## 2024-03-17 DIAGNOSIS — I2510 Atherosclerotic heart disease of native coronary artery without angina pectoris: Secondary | ICD-10-CM | POA: Diagnosis present

## 2024-03-17 DIAGNOSIS — Z833 Family history of diabetes mellitus: Secondary | ICD-10-CM

## 2024-03-17 DIAGNOSIS — I482 Chronic atrial fibrillation, unspecified: Secondary | ICD-10-CM | POA: Diagnosis not present

## 2024-03-17 DIAGNOSIS — X58XXXA Exposure to other specified factors, initial encounter: Secondary | ICD-10-CM | POA: Diagnosis present

## 2024-03-17 DIAGNOSIS — T502X5A Adverse effect of carbonic-anhydrase inhibitors, benzothiadiazides and other diuretics, initial encounter: Secondary | ICD-10-CM | POA: Diagnosis present

## 2024-03-17 DIAGNOSIS — E860 Dehydration: Secondary | ICD-10-CM | POA: Diagnosis not present

## 2024-03-17 DIAGNOSIS — Z9842 Cataract extraction status, left eye: Secondary | ICD-10-CM

## 2024-03-17 DIAGNOSIS — I10 Essential (primary) hypertension: Secondary | ICD-10-CM | POA: Diagnosis present

## 2024-03-17 DIAGNOSIS — I480 Paroxysmal atrial fibrillation: Secondary | ICD-10-CM | POA: Diagnosis present

## 2024-03-17 DIAGNOSIS — K8020 Calculus of gallbladder without cholecystitis without obstruction: Secondary | ICD-10-CM | POA: Diagnosis present

## 2024-03-17 DIAGNOSIS — E119 Type 2 diabetes mellitus without complications: Secondary | ICD-10-CM | POA: Diagnosis present

## 2024-03-17 DIAGNOSIS — Z87891 Personal history of nicotine dependence: Secondary | ICD-10-CM

## 2024-03-17 DIAGNOSIS — Z801 Family history of malignant neoplasm of trachea, bronchus and lung: Secondary | ICD-10-CM

## 2024-03-17 DIAGNOSIS — R000 Tachycardia, unspecified: Secondary | ICD-10-CM | POA: Diagnosis present

## 2024-03-17 DIAGNOSIS — Z961 Presence of intraocular lens: Secondary | ICD-10-CM | POA: Diagnosis present

## 2024-03-17 DIAGNOSIS — E785 Hyperlipidemia, unspecified: Secondary | ICD-10-CM | POA: Diagnosis present

## 2024-03-17 DIAGNOSIS — I5022 Chronic systolic (congestive) heart failure: Secondary | ICD-10-CM | POA: Diagnosis present

## 2024-03-17 DIAGNOSIS — I272 Pulmonary hypertension, unspecified: Secondary | ICD-10-CM | POA: Diagnosis present

## 2024-03-17 DIAGNOSIS — I110 Hypertensive heart disease with heart failure: Secondary | ICD-10-CM | POA: Diagnosis present

## 2024-03-17 DIAGNOSIS — Z951 Presence of aortocoronary bypass graft: Secondary | ICD-10-CM

## 2024-03-17 DIAGNOSIS — Z8619 Personal history of other infectious and parasitic diseases: Secondary | ICD-10-CM

## 2024-03-17 DIAGNOSIS — E871 Hypo-osmolality and hyponatremia: Secondary | ICD-10-CM | POA: Diagnosis present

## 2024-03-17 DIAGNOSIS — R748 Abnormal levels of other serum enzymes: Secondary | ICD-10-CM | POA: Diagnosis present

## 2024-03-17 DIAGNOSIS — Z932 Ileostomy status: Secondary | ICD-10-CM

## 2024-03-17 DIAGNOSIS — E039 Hypothyroidism, unspecified: Secondary | ICD-10-CM | POA: Diagnosis present

## 2024-03-17 DIAGNOSIS — Z803 Family history of malignant neoplasm of breast: Secondary | ICD-10-CM

## 2024-03-17 DIAGNOSIS — Z8673 Personal history of transient ischemic attack (TIA), and cerebral infarction without residual deficits: Secondary | ICD-10-CM

## 2024-03-17 DIAGNOSIS — M810 Age-related osteoporosis without current pathological fracture: Secondary | ICD-10-CM | POA: Diagnosis present

## 2024-03-17 LAB — COMPREHENSIVE METABOLIC PANEL WITH GFR
ALT: 29 U/L (ref 0–44)
AST: 49 U/L — ABNORMAL HIGH (ref 15–41)
Albumin: 4.9 g/dL (ref 3.5–5.0)
Alkaline Phosphatase: 341 U/L — ABNORMAL HIGH (ref 38–126)
Anion gap: 19 — ABNORMAL HIGH (ref 5–15)
BUN: 65 mg/dL — ABNORMAL HIGH (ref 8–23)
CO2: 25 mmol/L (ref 22–32)
Calcium: 10.3 mg/dL (ref 8.9–10.3)
Chloride: 86 mmol/L — ABNORMAL LOW (ref 98–111)
Creatinine, Ser: 2.98 mg/dL — ABNORMAL HIGH (ref 0.44–1.00)
GFR, Estimated: 16 mL/min — ABNORMAL LOW (ref >60)
Glucose, Bld: 149 mg/dL — ABNORMAL HIGH (ref 70–99)
Potassium: 4.7 mmol/L (ref 3.5–5.1)
Sodium: 130 mmol/L — ABNORMAL LOW (ref 135–145)
Total Bilirubin: 1 mg/dL (ref 0.0–1.2)
Total Protein: 9.8 g/dL — ABNORMAL HIGH (ref 6.5–8.1)

## 2024-03-17 LAB — MAGNESIUM: Magnesium: 1.6 mg/dL — ABNORMAL LOW (ref 1.7–2.4)

## 2024-03-17 LAB — CBC
HCT: 38.2 % (ref 36.0–46.0)
Hemoglobin: 12.2 g/dL (ref 12.0–15.0)
MCH: 33 pg (ref 26.0–34.0)
MCHC: 31.9 g/dL (ref 30.0–36.0)
MCV: 103.2 fL — ABNORMAL HIGH (ref 80.0–100.0)
Platelets: 144 10*3/uL — ABNORMAL LOW (ref 150–400)
RBC: 3.7 MIL/uL — ABNORMAL LOW (ref 3.87–5.11)
RDW: 21.6 % — ABNORMAL HIGH (ref 11.5–15.5)
WBC: 4.5 10*3/uL (ref 4.0–10.5)
nRBC: 0 % (ref 0.0–0.2)

## 2024-03-17 LAB — PHOSPHORUS: Phosphorus: 4.2 mg/dL (ref 2.5–4.6)

## 2024-03-17 LAB — CBG MONITORING, ED: Glucose-Capillary: 159 mg/dL — ABNORMAL HIGH (ref 70–99)

## 2024-03-17 LAB — LIPASE, BLOOD: Lipase: 351 U/L — ABNORMAL HIGH (ref 11–51)

## 2024-03-17 LAB — GLUCOSE, CAPILLARY: Glucose-Capillary: 122 mg/dL — ABNORMAL HIGH (ref 70–99)

## 2024-03-17 LAB — BRAIN NATRIURETIC PEPTIDE: B Natriuretic Peptide: 166.8 pg/mL — ABNORMAL HIGH (ref 0.0–100.0)

## 2024-03-17 MED ORDER — ONDANSETRON HCL 4 MG/2ML IJ SOLN: 4.0000 mg | Freq: Three times a day (TID) | INTRAMUSCULAR | Status: DC | PRN

## 2024-03-17 MED ORDER — ACETAMINOPHEN 325 MG PO TABS
650.0000 mg | ORAL_TABLET | Freq: Four times a day (QID) | ORAL | Status: DC | PRN
  Administered 2024-03-18: 650 mg via ORAL
  Filled 2024-03-17: qty 2

## 2024-03-17 MED ORDER — SUCRALFATE 1 G PO TABS
1.0000 g | ORAL_TABLET | Freq: Three times a day (TID) | ORAL | Status: DC
  Administered 2024-03-17 – 2024-03-19 (×6): 1 g via ORAL
  Filled 2024-03-17 (×6): qty 1

## 2024-03-17 MED ORDER — POLYSACCHARIDE IRON COMPLEX 150 MG PO CAPS
150.0000 mg | ORAL_CAPSULE | Freq: Every day | ORAL | Status: DC
  Administered 2024-03-18 – 2024-03-19 (×2): 150 mg via ORAL
  Filled 2024-03-17 (×2): qty 1

## 2024-03-17 MED ORDER — PANTOPRAZOLE SODIUM 40 MG PO TBEC
40.0000 mg | DELAYED_RELEASE_TABLET | Freq: Every day | ORAL | Status: DC
  Administered 2024-03-18 – 2024-03-19 (×2): 40 mg via ORAL
  Filled 2024-03-17 (×2): qty 1

## 2024-03-17 MED ORDER — LATANOPROST 0.005 % OP SOLN
1.0000 [drp] | Freq: Every day | OPHTHALMIC | Status: DC
  Administered 2024-03-18: 1 [drp] via OPHTHALMIC
  Filled 2024-03-17: qty 2.5

## 2024-03-17 MED ORDER — COLCHICINE 0.6 MG PO TABS: 0.6000 mg | ORAL_TABLET | ORAL | Status: DC | PRN

## 2024-03-17 MED ORDER — INSULIN ASPART 100 UNIT/ML IJ SOLN
0.0000 [IU] | Freq: Three times a day (TID) | INTRAMUSCULAR | Status: DC
  Administered 2024-03-18 – 2024-03-19 (×3): 1 [IU] via SUBCUTANEOUS
  Filled 2024-03-17 (×2): qty 1

## 2024-03-17 MED ORDER — ALLOPURINOL 100 MG PO TABS
300.0000 mg | ORAL_TABLET | Freq: Every day | ORAL | Status: DC
  Administered 2024-03-18 – 2024-03-19 (×2): 300 mg via ORAL
  Filled 2024-03-17 (×2): qty 3

## 2024-03-17 MED ORDER — SODIUM CHLORIDE 0.9 % IV BOLUS
250.0000 mL | Freq: Once | INTRAVENOUS | Status: AC
  Administered 2024-03-17: 250 mL via INTRAVENOUS

## 2024-03-17 MED ORDER — MAGNESIUM SULFATE 2 GM/50ML IV SOLN
2.0000 g | Freq: Once | INTRAVENOUS | Status: AC
  Administered 2024-03-17: 2 g via INTRAVENOUS
  Filled 2024-03-17: qty 50

## 2024-03-17 MED ORDER — ONDANSETRON HCL 4 MG/2ML IJ SOLN
4.0000 mg | Freq: Once | INTRAMUSCULAR | Status: AC
  Administered 2024-03-17: 4 mg via INTRAVENOUS
  Filled 2024-03-17: qty 2

## 2024-03-17 MED ORDER — APIXABAN 2.5 MG PO TABS
2.5000 mg | ORAL_TABLET | Freq: Two times a day (BID) | ORAL | Status: DC
  Administered 2024-03-17 – 2024-03-19 (×4): 2.5 mg via ORAL
  Filled 2024-03-17 (×5): qty 1

## 2024-03-17 MED ORDER — MIDODRINE HCL 5 MG PO TABS
10.0000 mg | ORAL_TABLET | Freq: Three times a day (TID) | ORAL | Status: DC
  Administered 2024-03-17 – 2024-03-19 (×5): 10 mg via ORAL
  Filled 2024-03-17 (×5): qty 2

## 2024-03-17 MED ORDER — ROSUVASTATIN CALCIUM 10 MG PO TABS
5.0000 mg | ORAL_TABLET | Freq: Every day | ORAL | Status: DC
  Administered 2024-03-18 – 2024-03-19 (×2): 5 mg via ORAL
  Filled 2024-03-17 (×2): qty 1

## 2024-03-17 MED ORDER — INSULIN ASPART 100 UNIT/ML IJ SOLN: 0.0000 [IU] | Freq: Every day | INTRAMUSCULAR | Status: DC

## 2024-03-17 MED ORDER — LEVOTHYROXINE SODIUM 50 MCG PO TABS
50.0000 ug | ORAL_TABLET | Freq: Every day | ORAL | Status: DC
  Administered 2024-03-18 – 2024-03-19 (×2): 50 ug via ORAL
  Filled 2024-03-17 (×2): qty 1

## 2024-03-17 MED ORDER — ALBUTEROL SULFATE (2.5 MG/3ML) 0.083% IN NEBU: 2.5000 mg | INHALATION_SOLUTION | RESPIRATORY_TRACT | Status: DC | PRN

## 2024-03-17 MED ORDER — VITAMIN C 500 MG PO TABS
500.0000 mg | ORAL_TABLET | Freq: Every day | ORAL | Status: DC
  Administered 2024-03-18 – 2024-03-19 (×2): 500 mg via ORAL
  Filled 2024-03-17 (×2): qty 1

## 2024-03-17 MED ORDER — ACETAMINOPHEN 325 MG PO TABS
650.0000 mg | ORAL_TABLET | Freq: Once | ORAL | Status: AC
  Administered 2024-03-17: 650 mg via ORAL
  Filled 2024-03-17: qty 2

## 2024-03-17 MED ORDER — SODIUM CHLORIDE 0.9 % IV SOLN: INTRAVENOUS | Status: DC

## 2024-03-17 NOTE — ED Notes (Signed)
 This RN received report from [place] RN and performed bedside care handoff. This RN introduced self to pt. Call light in reach, bed wheels locked, side rail raised, pt updated on plan of care. Rounding completed. High fall risk precautions in place.

## 2024-03-17 NOTE — ED Notes (Signed)
 Patient drinking oral contrast and wait 2 hours before CT scan.

## 2024-03-17 NOTE — ED Triage Notes (Signed)
 Pt had partial colectomy on 4/1. Pt was discharged home on 4/15 with instructions to double diuretic x3 days. Pt went back to regular dosing Saturday. This morning, pt was weaker than normal and had an episode of emesis. Pt reports normal amount of output from ileostomy. Pt concerned for dehydration.

## 2024-03-17 NOTE — ED Provider Notes (Addendum)
 [facility] Provider Note    Event Date/Time   First MD Initiated Contact with Patient 03/17/24 1656     (approximate)   History   Emesis   HPI  Tammy Arias is a 68 year old female with history of HTN, diabetes, paroxysmal A-fib on Eliquis , CHF, CAD presenting to the emergency department for evaluation of vomiting.  Patient reports that since leaving the hospital a few days ago she has been taking her increased diuretic as directed.  She think she has lost about 10 pounds over the past 5 days.  Today she felt shaky and had 1 episode of vomiting leading her to present to the ER.  No abdominal pain.  Has had slightly thinner output from her ostomy, but normal volume.  I reviewed her discharge summary from 03/12/2024.  At that time, she presented with vomiting and diarrhea found to have septic shock secondary to C. difficile colitis requiring vasopressor support.  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  She had a AKI that improved as well as lower extremity edema for which she was diuresed.  She is instructed take torsemide  40 mg for 3 days and then return to her 20 mg daily.       Physical Exam   Triage Vital Signs: ED Triage Vitals  Encounter Vitals Group     BP 03/17/24 1649 108/78     Systolic BP Percentile --      Diastolic BP Percentile --      Pulse Rate 03/17/24 1649 (!) 103     Resp 03/17/24 1649 17     Temp 03/17/24 1649 98.6 F (37 C)     Temp Source 03/17/24 1649 Oral     SpO2 03/17/24 1649 97 %     Weight 03/17/24 1650 112 lb (50.8 kg)     Height 03/17/24 1650 5\' 1"  (1.549 m)     Head Circumference --      Peak Flow --      Pain Score --      Pain Loc --      Pain Education --      Exclude from Growth Chart --     Most recent vital signs: Vitals:   03/17/24 2130 03/17/24 2205  BP: (!) 112/57 108/65  Pulse: 81 83  Resp: 11 18  Temp:  97.9 F (36.6 C)  SpO2: 96% 98%     General: Awake,  interactive  HEENT: Dry mucous membranes and lips CV:  Mild tachycardia, good peripheral perfusion, no appreciable lower extremity edema Resp:  Unlabored respirations, lungs good auscultation Abd:  Nondistended, soft, nontender, dark green thin ostomy output Neuro:  Symmetric facial movement, fluid speech   ED Results / Procedures / Treatments   Labs (all labs ordered are listed, but only abnormal results are displayed) Labs Reviewed  CBC - Abnormal; Notable for the following components:      Result Value   RBC 3.70 (*)    MCV 103.2 (*)    RDW 21.6 (*)    Platelets 144 (*)    All other components within normal limits  COMPREHENSIVE METABOLIC PANEL WITH GFR - Abnormal; Notable for the following components:   Sodium 130 (*)    Chloride 86 (*)    Glucose, Bld 149 (*)    BUN 65 (*)    Creatinine, Ser 2.98 (*)    Total Protein 9.8 (*)    AST 49 (*)    Alkaline  Phosphatase 341 (*)    GFR, Estimated 16 (*)    Anion gap 19 (*)    All other components within normal limits  LIPASE, BLOOD - Abnormal; Notable for the following components:   Lipase 351 (*)    All other components within normal limits  BRAIN NATRIURETIC PEPTIDE - Abnormal; Notable for the following components:   B Natriuretic Peptide 166.8 (*)    All other components within normal limits  MAGNESIUM  - Abnormal; Notable for the following components:   Magnesium  1.6 (*)    All other components within normal limits  GLUCOSE, CAPILLARY - Abnormal; Notable for the following components:   Glucose-Capillary 122 (*)    All other components within normal limits  CBG MONITORING, ED - Abnormal; Notable for the following components:   Glucose-Capillary 159 (*)    All other components within normal limits  PHOSPHORUS  BASIC METABOLIC PANEL WITH GFR  CBC  MAGNESIUM   LIPASE, BLOOD     EKG EKG independently reviewed interpreted by myself (ER attending) demonstrates:  EKG demonstrates sinus with atrial flutter at 93, QRS 96,  QTc 454, no acute ST changes  RADIOLOGY Imaging independently reviewed and interpreted by myself demonstrates:  CT A/P without bowel obstruction, formal radiology read pending  Formal Radiology Read:  CT ABDOMEN PELVIS WO CONTRAST Result Date: 03/17/2024 CLINICAL DATA:  Postop emesis.  Recent colectomy.  Abdominal pain. EXAM: CT ABDOMEN AND PELVIS WITHOUT CONTRAST TECHNIQUE: Multidetector CT imaging of the abdomen and pelvis was performed following the standard protocol without IV contrast. RADIATION DOSE REDUCTION: This exam was performed according to the departmental dose-optimization program which includes automated exposure control, adjustment of the mA and/or kV according to patient size and/or use of iterative reconstruction technique. COMPARISON:  CT abdomen pelvis 02/27/2024 FINDINGS: Lower chest: Cardiomegaly.  No acute abnormality. Hepatobiliary: Cholelithiasis without evidence of acute cholecystitis. Unremarkable noncontrast appearance of the liver. Pancreas: Unremarkable. Spleen: Unremarkable. Adrenals/Urinary Tract: Stable adrenal glands. No urinary calculi or hydronephrosis. Unremarkable bladder. 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 Right lower quadrant ileostomy. Enteric contrast is visualized within the stomach and small bowel. No bowel wall thickening or obstruction. Vascular/Lymphatic: Aortic atherosclerosis. No enlarged abdominal or pelvic lymph nodes. Reproductive: Uterus and bilateral adnexa are unremarkable. Other: Small volume free fluid in the pelvis is likely postoperative. No free intraperitoneal air. Musculoskeletal: No acute fracture. IMPRESSION: 1. 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 No bowel obstruction. 2. Small volume free fluid in the pelvis is likely postoperative. 3. Cholelithiasis without evidence of acute cholecystitis. 4. Aortic Atherosclerosis (ICD10-I70.0). Electronically Signed   By: Rozell Cornet M.D.   On:  03/17/2024 20:45    PROCEDURES:  Critical Care performed: Yes, see critical care procedure note(s)  CRITICAL CARE Performed by: Claria Crofts   Total critical care time: 31 minutes  Critical care time was exclusive of separately billable procedures and treating other patients.  Critical care was necessary to treat or prevent imminent or life-threatening deterioration.  Critical care was time spent personally by me on the following activities: development of treatment plan with patient and/or surrogate as well as nursing, discussions with consultants, evaluation of patient's response to treatment, examination of patient, obtaining history from patient or surrogate, ordering and performing treatments and interventions, ordering and review of laboratory studies, ordering and review of radiographic studies, pulse oximetry and re-evaluation of patient's condition.   Procedures   MEDICATIONS ORDERED IN ED: Medications  albuterol  (PROVENTIL ) (2.5 MG/3ML) 0.083% nebulizer solution 2.5 mg (  has no administration in time range)  0.9 %  sodium chloride  infusion ( Intravenous New Bag/Given 03/17/24 2308)  ondansetron  (ZOFRAN ) injection 4 mg (has no administration in time range)  acetaminophen  (TYLENOL ) tablet 650 mg (has no administration in time range)  insulin  aspart (novoLOG ) injection 0-9 Units (has no administration in time range)  insulin  aspart (novoLOG ) injection 0-5 Units ( Subcutaneous Not Given 03/17/24 2243)  allopurinol  (ZYLOPRIM ) tablet 300 mg (has no administration in time range)  colchicine  tablet 0.6-1.8 mg (has no administration in time range)  midodrine  (PROAMATINE ) tablet 10 mg (10 mg Oral Given 03/17/24 2305)  rosuvastatin  (CRESTOR ) tablet 5 mg (has no administration in time range)  levothyroxine  (SYNTHROID ) tablet 50 mcg (has no administration in time range)  pantoprazole  (PROTONIX ) EC tablet 40 mg (has no administration in time range)  sucralfate  (CARAFATE ) tablet 1 g (1 g Oral  Given 03/17/24 2305)  apixaban  (ELIQUIS ) tablet 2.5 mg (2.5 mg Oral Given 03/17/24 2305)  iron  polysaccharides (NIFEREX) capsule 150 mg (has no administration in time range)  ascorbic acid  (VITAMIN C ) tablet 500 mg (has no administration in time range)  latanoprost  (XALATAN ) 0.005 % ophthalmic solution 1 drop (1 drop Both Eyes Not Given 03/17/24 2310)  sodium chloride  0.9 % bolus 250 mL (0 mLs Intravenous Stopped 03/17/24 1934)  acetaminophen  (TYLENOL ) tablet 650 mg (650 mg Oral Given 03/17/24 1835)  ondansetron  (ZOFRAN ) injection 4 mg (4 mg Intravenous Given 03/17/24 1835)  magnesium  sulfate IVPB 2 g 50 mL (0 g Intravenous Stopped 03/17/24 2122)     IMPRESSION / MDM / ASSESSMENT AND PLAN / ED COURSE  I reviewed the triage vital signs and the nursing notes.  Differential diagnosis includes, but is not limited to, bowel obstruction, other acute intra-abdominal process, dehydration, electrolyte abnormality  Patient's presentation is most consistent with acute presentation with potential threat to life or bodily function.  68 year old female presenting with weakness and vomiting with recent complicated hospital course.  Mild tachycardia already on presentation.  Labs with normal white blood cell count.  CMP unfortunately demonstrates recurrent AKI with creatinine of 2.98, was 0.82 at the time of discharge.  BNP minimally elevated at 166.  Lipase elevated at 351, but patient without abdominal pain on exam, consideration for lipase elevation related to recent surgery.  Abdomen is not significantly tender, but with recent complicated history will obtain CT abdomen pelvis with oral contrast in the setting of recent vomiting.  Suspect that patient's AKI may be related to overdiuresis in the setting of her recently increased torsemide  dosing.  Will give small fluid bolus here.  Magnesium  low at 1.6, IV repletion ordered.  Awaiting CT completion, but anticipate patient will be appropriate for admission given her  significant AKI and tenuous fluid balance.  Clinical Course as of 03/17/24 2348  Sun Mar 17, 2024  2036 Case discussed with Dr. Mauri Sous who reviewed the patient's imaging.  He fortunately does not see any complications with patient's bowel.  Notes limited evaluation of pancreas on a non-IV contrasted study, but does not see any obvious pancreatic abnormalities or other acute surgical concerns.  Will reach out to hospitalist team for admission. [NR]  2048 CT ABDOMEN PELVIS WO CONTRAST Radiology confirms no acute findings on patient CT. [NR]    Clinical Course User Index [NR] Claria Crofts, MD   Case discussed with Dr. Rosalea Collin. Will evaluate for anticipated admission.     FINAL CLINICAL IMPRESSION(S) / ED DIAGNOSES   Final diagnoses:  AKI (acute kidney injury) (HCC)  Nausea and vomiting, unspecified vomiting type     Rx / DC Orders   ED Discharge Orders     None        Note:  This document was prepared using Dragon voice recognition software and may include unintentional dictation errors.   Claria Crofts, MD 03/17/24 6962    Claria Crofts, MD 03/17/24 2350

## 2024-03-17 NOTE — H&P (Signed)
 History and Physical    Tammy Arias ZOX:[id number] DOB: May 28, 1956 DOA: 03/17/2024  Referring MD/NP/PA:   PCP: Lyle San, MD   Patient coming from:  The patient is coming from home.     Chief Complaint: Weakness  HPI: Tammy Arias is a 68 y.o. female with medical history significant of HTN,  HLD, DM, CAD with CABG, sCHF with EF35-40%, TIA, hypothyroidism, gout, GERD, GI bleeding, AVM, liver cirrhosis, OSA on CPAP, anemia, A-fib on Eliquis  (s/p of Watchman procedure), pulmonary hypertension, IBS, Cor Atrarium (s/p of repair), lumbar stenosis, recent admission due to pancolitis and C. difficile colitis (s/p of colectomy end ileostomy), who presents with weakness.  Patient was recently hospitalized from 4/1 - 4/15 due to severe sepsis with septic shock secondary to pancolitis and C. difficile colitis.  Patient is s/p of colectomy and ileostomy. At discharge, her torsemide  dose was increased from 20 to 40 mg twice daily. She is also taking spironolactone  25 mg daily.  Patient states that she was instructed to take 40 mg torsemide  twice daily for 3 days, but she took extra dose due to leg edema. She lost more than 10 pounds over the past 5 days.  She feels generalized weak.  She she has nausea, and vomited once, no abdominal pain. She has had slightly thinner output from her ostomy, but normal volume. No fever or chills.  Denies chest pain, cough, SOB.  No symptoms of UTI.   Data reviewed independently and ED Course: pt was found to have AKI recent creatinine 2.98, BUN 65, GFR 16 (baseline creatinine 0.82 on 03/12/2024), WBC 4.5, magnesium  1.6, potassium 4.7, BNP 166.8.  Temperature normal, blood pressure 109/62, heart rate of 103, 99, 21, 12, oxygen saturation 97% on room air.  Patient is placed in telemetry bed for observation.  CT of abdomen/pelvis: 1. 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 No bowel obstruction. 2. Small volume free fluid in the pelvis is likely  postoperative. 3. Cholelithiasis without evidence of acute cholecystitis. 4. Aortic Atherosclerosis (ICD10-I70.0).   EKG: I have personally reviewed.  A-fib, QTc 463, LAD, T wave inversion in precordial leads.   Review of Systems:   General: no fevers, chills, no body weight gain, has fatigue HEENT: no blurry vision, hearing changes or sore throat Respiratory: no dyspnea, coughing, wheezing CV: no chest pain, no palpitations GI: has nausea, vomiting, no abdominal pain, diarrhea, constipation GU: no dysuria, burning on urination, increased urinary frequency, hematuria  Ext: no leg edema Neuro: no unilateral weakness, numbness, or tingling, no vision change or hearing loss Skin: no rash, no skin tear. MSK: No muscle spasm, no deformity, no limitation of range of movement in spin Heme: No easy bruising.  Travel history: No recent Arias distant travel.   Allergy:  Allergies  Allergen Reactions   Vioxx [Rofecoxib] Swelling    Past Medical History:  Diagnosis Date   AC (acromioclavicular) joint bone spurs, right    Anemia    Arthritis    Atrial fibrillation (HCC)    a.) CHA2DS2-VASc = 7 (age, sex, HFrEF, HTN, TIA x 2, T2DM). b.) rate/rhythm controlled on oral carvedilol ; chronically anticoagulated with warfarin.   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 b.) R/LHC 03/29/2017: small RCA with occluded SVG; no significant Dz. Chronically occluded LAD  with patent SVG to D1/LAD. Insignificant Dz in LCx. LM normal.   Cortical cataract    DOE (dyspnea on exertion)    DOE (dyspnea on exertion)    GERD (gastroesophageal reflux disease)    HFrEF (heart failure with reduced ejection fraction) (HCC)    a.) TTE 10/27/2014: EF 40%; glob HK; mild BAE; triv AR, mild MR/PR, mod TR.  b.) TTE 03/15/2017: EF 30%; glob HK; mod BAE; mod pHTN  (RVSP 54.8 mmHg); mild PR, mod MR; sev TR.  c.) R/LHC 03/29/2017: EF 30-35%. d.)TTE 10/25/2018: EF 35%; mod BAE; mod BVE; glob HK; triv PR, mod MR, sev TR; RVSP 57.7 mmHg; G2DD.   History of shingles 2004   Hyperlipidemia    Hypertension    Hypothyroidism    IBS (irritable bowel syndrome)    Ischemic cardiomyopathy    a.) TTE 10/27/2014: EF 40%. b.) TTE 03/15/2017: EF 30%. c.) R/LHC 04/01/2017: EF 30-35%. d.) TTE 10/25/2018: EF 35%   Lumbar scoliosis    Lumbar spinal stenosis    Migraines    Osteoporosis    Pulmonary HTN (HCC)    a.) TTE 03/15/2017: EF 30-35%; RVSP 54.8 mmHg. 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   Sleep apnea    T2DM (type 2 diabetes mellitus) (HCC)    TIA (transient ischemic attack) 05/29/2016   Vitamin B 12 deficiency     Past Surgical History:  Procedure Laterality Date   CARDIAC CATHETERIZATION     CATARACT EXTRACTION W/ INTRAOCULAR LENS IMPLANT Bilateral    Cataract Extraction with IOL   COLECTOMY  02/28/2024   Procedure: COLECTOMY, TOTAL;  Surgeon: Flynn Hylan, MD;  Location: [facility] ORS;  Service: General;;   COLONOSCOPY N/A 12/19/2018   Procedure: COLONOSCOPY;  Surgeon: Irby Mannan, MD;  Location: [facility] ENDOSCOPY;  Service: Endoscopy;  Laterality: N/A;   COLONOSCOPY WITH PROPOFOL  N/A 06/17/2020   Procedure: COLONOSCOPY WITH PROPOFOL ;  Surgeon: Irby Mannan, MD;  Location: [facility] ENDOSCOPY;  Service: Endoscopy;  Laterality: N/A;   COLONOSCOPY WITH PROPOFOL  N/A 03/30/2023   Procedure: COLONOSCOPY WITH PROPOFOL ;  Surgeon: Quintin Buckle, DO;  Location: [facility] ENDOSCOPY;  Service: Endoscopy;  Laterality: N/A;   COR TRIATRIATUM REPAIR N/A 01/28/2004   CORONARY ARTERY BYPASS GRAFT N/A 01/28/2004   3v CABG   ESOPHAGOGASTRODUODENOSCOPY N/A 12/19/2018   Procedure: ESOPHAGOGASTRODUODENOSCOPY (EGD);  Surgeon: Irby Mannan, MD;   Location: [facility] ENDOSCOPY;  Service: Endoscopy;  Laterality: N/A;   ESOPHAGOGASTRODUODENOSCOPY (EGD) WITH PROPOFOL  N/A 06/17/2020   Procedure: ESOPHAGOGASTRODUODENOSCOPY (EGD) WITH PROPOFOL ;  Surgeon: Irby Mannan, MD;  Location: [facility] ENDOSCOPY;  Service: Endoscopy;  Laterality: N/A;   ESOPHAGOGASTRODUODENOSCOPY (EGD) WITH PROPOFOL  N/A 03/30/2023   Procedure: ESOPHAGOGASTRODUODENOSCOPY (EGD) WITH PROPOFOL ;  Surgeon: Quintin Buckle, DO;  Location: [facility] ENDOSCOPY;  Service: Endoscopy;  Laterality: N/A;   LAPAROTOMY N/A 02/28/2024   Procedure: LAPAROTOMY, EXPLORATORY;  Surgeon: Flynn Hylan, MD;  Location: [facility] ORS;  Service: General;  Laterality: N/A;   RIGHT/LEFT HEART CATH AND CORONARY ANGIOGRAPHY Bilateral 03/29/2017   Procedure: Right/Left Heart Cath and Coronary Angiography;  Surgeon: Ronney Cola, MD;  Location: [facility] INVASIVE CV LAB;  Service: Cardiovascular;  Laterality: Bilateral;   TOTAL KNEE ARTHROPLASTY Right 04/05/2022   Procedure: TOTAL KNEE ARTHROPLASTY;  Surgeon: Molli Angelucci, MD;  Location: [facility] ORS;  Service: Orthopedics;  Laterality: Right;   TUBAL LIGATION  VENTRAL HERNIA REPAIR N/A 04/21/2017   Procedure: HERNIA REPAIR VENTRAL ADULT;  Surgeon: Benancio Bracket, MD;  Location: [facility] ORS;  Service: General;  Laterality: N/A;    Social History:  reports that she quit smoking about 24 years ago. Her smoking use included cigarettes. She has never used smokeless tobacco. She reports that she does not drink alcohol and does not use drugs.  Family History:  Family History  Problem Relation Age of Onset   Valvular heart disease Mother    Diabetes Father    Cancer Sister        lung   Cancer Sister        cervical   Breast cancer Cousin      Prior to Admission medications   Medication Sig Start Date End Date Taking? Authorizing Provider  albuterol  (PROVENTIL  HFA;VENTOLIN  HFA) 108 (90 Base) MCG/ACT inhaler Inhale 2 puffs into the lungs every 6 (six) hours  as needed for wheezing or shortness of breath.    [provider]  allopurinol  (ZYLOPRIM ) 300 MG tablet Take 300 mg by mouth daily.    [provider]  ascorbic acid  (VITAMIN C ) 500 MG tablet Take 1 tablet (500 mg total) by mouth daily. 03/12/24 06/10/24  Althia Atlas, MD  colchicine  0.6 MG tablet Take 0.6-1.8 mg by mouth as needed (take 1.2 mg (2 tablets) at onset of gout flare, Take one additinaltablet one hour later if symptoms persist. Maximum of 3 tablets per day).    [provider]  ELIQUIS  5 MG TABS tablet Take 5 mg by mouth 2 (two) times daily.    [provider]  iron  polysaccharides (NIFEREX) 150 MG capsule Take 1 capsule (150 mg total) by mouth daily. 03/12/24 06/10/24  Althia Atlas, MD  JARDIANCE 10 MG TABS tablet Take 10 mg by mouth daily.    [provider]  latanoprost  (XALATAN ) 0.005 % ophthalmic solution Place 1 drop into both eyes at bedtime.    [provider]  levothyroxine  (SYNTHROID ) 50 MCG tablet Take 50 mcg by mouth daily before breakfast.    [provider]  metFORMIN  (GLUCOPHAGE ) 1000 MG tablet Take 1,000 mg by mouth 2 (two) times daily with a meal.    [provider]  midodrine  (PROAMATINE ) 10 MG tablet Take 1 tablet (10 mg total) by mouth 3 (three) times daily with meals. 03/12/24 06/10/24  Althia Atlas, MD  pantoprazole  (PROTONIX ) 40 MG tablet Take 40 mg by mouth daily. 01/28/21   [provider]  rosuvastatin  (CRESTOR ) 5 MG tablet Take 5 mg by mouth daily. 02/20/24 02/19/25  [provider]  spironolactone  (ALDACTONE ) 25 MG tablet Take 25 mg by mouth daily.    [provider]  sucralfate  (CARAFATE ) 1 g tablet Take 1 g by mouth 4 (four) times daily -  with meals and at bedtime.    [provider]  torsemide  (DEMADEX ) 20 MG tablet Take 1 tablet (20 mg total) by mouth 2 (two) times daily. 03/16/24   Althia Atlas, MD  torsemide  40 MG TABS Take 40 mg by mouth 2 (two) times  daily for 3 days. 03/12/24 03/15/24  Althia Atlas, MD  valsartan  (DIOVAN ) 40 MG tablet Take 20 mg by mouth 2 (two) times daily. 02/20/24 02/19/25  [provider]  vitamin B-12 (CYANOCOBALAMIN ) 1000 MCG tablet Take 1,000 mcg by mouth daily.    [provider]    Physical Exam: Vitals:   03/17/24 1805 03/17/24 1900 03/17/24 1915 03/17/24 1930  BP: 110/60  109/62  Pulse: 84 84 96 99  Resp: 12 15 (!) 21 12  Temp:      TempSrc:      SpO2: 98% 97% 97% 97%  Weight:      Height:       General: Not in acute distress.  Dry mucous membrane HEENT:       Eyes: PERRL, EOMI, no jaundice       ENT: No discharge from the ears and nose, no pharynx injection, no tonsillar enlargement.        Neck: No JVD, no bruit, no mass felt. Heme: No neck lymph node enlargement. Cardiac: S1/S2, irregularly irregular rhythm, no gallops or rubs. Respiratory: No rales, wheezing, rhonchi or rubs. GI: Soft, nondistended, nontender, no rebound pain, no organomegaly, BS present.  S/p of ileostomy with clean surroundings, no blood in the bag GU: No hematuria Ext: No pitting leg edema bilaterally. 1+DP/PT pulse bilaterally. Musculoskeletal: No joint deformities, No joint redness or warmth, no limitation of ROM in spin. Skin: No rashes.  Neuro: Alert, oriented X3, cranial nerves II-XII grossly intact, moves all extremities normally.  Psych: Patient is not psychotic, no suicidal or hemocidal ideation.  Labs on Admission: I have personally reviewed following labs and imaging studies  CBC: Recent Labs  Lab 03/11/24 1015 03/12/24 0440 03/17/24 1723  WBC 6.2 4.2 4.5  HGB 8.5* 8.1* 12.2  HCT 26.3* 24.6* 38.2  MCV 101.5* 99.2 103.2*  PLT 121* 111* 144*   Basic Metabolic Panel: Recent Labs  Lab 03/11/24 1015 03/12/24 0440 03/17/24 1723  NA 138 138 130*  K 4.0 3.7 4.7  CL 100 101 86*  CO2 27 27 25   GLUCOSE 230* 133* 149*  BUN 28* 27* 65*  CREATININE 0.92 0.82 2.98*  CALCIUM  8.9 9.0 10.3   MG 1.6* 2.1 1.6*  PHOS 3.0 3.6  --    GFR: Estimated Creatinine Clearance: 13.4 mL/min (A) (by C-G formula based on SCr of 2.98 mg/dL (H)). Liver Function Tests: Recent Labs  Lab 03/17/24 1723  AST 49*  ALT 29  ALKPHOS 341*  BILITOT 1.0  PROT 9.8*  ALBUMIN  4.9   Recent Labs  Lab 03/17/24 1723  LIPASE 351*   No results for input(s): "AMMONIA" in the last 168 hours. Coagulation Profile: No results for input(s): "INR", "PROTIME" in the last 168 hours. Cardiac Enzymes: No results for input(s): "CKTOTAL", "CKMB", "CKMBINDEX", "TROPONINI" in the last 168 hours. BNP (last 3 results) No results for input(s): "PROBNP" in the last 8760 hours. HbA1C: No results for input(s): "HGBA1C" in the last 72 hours. CBG: Recent Labs  Lab 03/11/24 1601 03/11/24 2041 03/12/24 0038 03/12/24 0824 03/17/24 1734  GLUCAP 144* 243* 172* 143* 159*   Lipid Profile: No results for input(s): "CHOL", "HDL", "LDLCALC", "TRIG", "CHOLHDL", "LDLDIRECT" in the last 72 hours. Thyroid  Function Tests: No results for input(s): "TSH", "T4TOTAL", "FREET4", "T3FREE", "THYROIDAB" in the last 72 hours. Anemia Panel: No results for input(s): "VITAMINB12", "FOLATE", "FERRITIN", "TIBC", "IRON ", "RETICCTPCT" in the last 72 hours. Urine analysis:    Component Value Date/Time   COLORURINE YELLOW (A) 02/29/2024 0431   APPEARANCEUR HAZY (A) 02/29/2024 0431   LABSPEC 1.009 02/29/2024 0431   PHURINE 6.0 02/29/2024 0431   GLUCOSEU 150 (A) 02/29/2024 0431   HGBUR LARGE (A) 02/29/2024 0431   BILIRUBINUR NEGATIVE 02/29/2024 0431   KETONESUR NEGATIVE 02/29/2024 0431   PROTEINUR 30 (A) 02/29/2024 0431   NITRITE NEGATIVE 02/29/2024 0431   LEUKOCYTESUR NEGATIVE 02/29/2024 0431   Sepsis Labs: @  LABRCNTIP(procalcitonin:4,lacticidven:4) )No results found for this or any previous visit (from the past 240 hours).   Radiological Exams on Admission:   Assessment/Plan Principal Problem:   AKI (acute kidney injury)  (HCC) Active Problems:   Dehydration   CAD with hx of CABG   Atrial fibrillation, chronic (HCC)   Chronic systolic CHF (congestive heart failure) (HCC)   HTN (hypertension)   Hyperlipidemia   Hypothyroidism   Iron  deficiency anemia   Hyponatremia   Hypomagnesemia   Gout   GERD (gastroesophageal reflux disease)   Elevated lipase   OSA on CPAP   Assessment and Plan:  AKI (acute kidney injury) (HCC): Likely due to dehydration and overdiuresis.  CT of abdomen/pelvis is negative for hydronephrosis.  No symptoms of UTI.  Will give gentle IV fluid.  -Place in telemetry bed for observation - Hold torsemide , spironolactone  - Hold Diovan  (patient is not taking this medications) - IV fluid: 250 cc normal saline bolus in the ED, then 50 cc/h - Repeat BMP in morning  Dehydration: Patient is clinically dry -See above  CAD with hx of CABG: No chest pain -Continue Crestor   Atrial fibrillation, chronic (HCC): Heart rate 90-100 -Continue Eliquis   Chronic systolic CHF (congestive heart failure) (HCC): 2D echo on 03/07/2024 showed EF 35-40%.  No leg edema or SOB.  Clinically patient is dry. - Hold torsemide  and spironolactone   HTN (hypertension): Blood pressure 109/62.  Patient is taking midodrine  for hypotension - Continue midodrine  10 mg 3 times daily with holding parameter of SBP > 130  Hyperlipidemia -Crestor   Hypothyroidism -Synthroid   Iron  deficiency anemia: Hemoglobin stable, 12.2 today -Continue iron  supplement  Hyponatremia: Mild.  Sodium 130. -On normal saline IV as above  Hypomagnesemia: Magnesium  1.6.  Potassium 4.7 -Repleted with magnesium  sulfate 2 g - Check phosphorus level  Gout -Continue home allopurinol  and as needed colchicine   GERD (gastroesophageal reflux disease) -Protonix  and Carafate   Elevated lipase: Lipase 351, no abdominal pain.  Pancreas is unremarkable on CT scan, clinically does not have pancreatitis. -Repeat the lipase in the  morning  OSA - on CPAP      DVT ppx: on Eliquis   Code Status: Full code   Family Communication:  Yes, patient's friend  at bed side.       Disposition Plan:  Anticipate discharge back to previous environment  Consults called:  none  Admission status and Level of care: Telemetry Medical:    for obs     Dispo: The patient is from: Home              Anticipated d/c is to: Home              Anticipated d/c date is: 1 day              Patient currently is not medically stable to d/c.    Severity of Illness:  The appropriate patient status for this patient is OBSERVATION. Observation status is judged to be reasonable and necessary in order to provide the required intensity of service to ensure the patient's safety. The patient's presenting symptoms, physical exam findings, and initial radiographic and laboratory data in the context of their medical condition is felt to place them at decreased risk for further clinical deterioration. Furthermore, it is anticipated that the patient will be medically stable for discharge from the hospital within 2 midnights of admission.        Date of Service 03/17/2024    Fidencio Hue [facility]   If 7PM-7AM,  please contact night-coverage [email or web address] 03/17/2024, 9:40 PM

## 2024-03-18 DIAGNOSIS — E860 Dehydration: Secondary | ICD-10-CM | POA: Diagnosis present

## 2024-03-18 DIAGNOSIS — I2510 Atherosclerotic heart disease of native coronary artery without angina pectoris: Secondary | ICD-10-CM | POA: Diagnosis present

## 2024-03-18 DIAGNOSIS — T502X5A Adverse effect of carbonic-anhydrase inhibitors, benzothiadiazides and other diuretics, initial encounter: Secondary | ICD-10-CM | POA: Diagnosis present

## 2024-03-18 DIAGNOSIS — Z7984 Long term (current) use of oral hypoglycemic drugs: Secondary | ICD-10-CM | POA: Diagnosis not present

## 2024-03-18 DIAGNOSIS — Z932 Ileostomy status: Secondary | ICD-10-CM | POA: Diagnosis not present

## 2024-03-18 DIAGNOSIS — I272 Pulmonary hypertension, unspecified: Secondary | ICD-10-CM | POA: Diagnosis present

## 2024-03-18 DIAGNOSIS — N179 Acute kidney failure, unspecified: Secondary | ICD-10-CM | POA: Diagnosis present

## 2024-03-18 DIAGNOSIS — I110 Hypertensive heart disease with heart failure: Secondary | ICD-10-CM | POA: Diagnosis present

## 2024-03-18 DIAGNOSIS — I480 Paroxysmal atrial fibrillation: Secondary | ICD-10-CM | POA: Diagnosis present

## 2024-03-18 DIAGNOSIS — I482 Chronic atrial fibrillation, unspecified: Secondary | ICD-10-CM | POA: Diagnosis present

## 2024-03-18 DIAGNOSIS — M109 Gout, unspecified: Secondary | ICD-10-CM | POA: Diagnosis present

## 2024-03-18 DIAGNOSIS — E039 Hypothyroidism, unspecified: Secondary | ICD-10-CM | POA: Diagnosis present

## 2024-03-18 DIAGNOSIS — K7460 Unspecified cirrhosis of liver: Secondary | ICD-10-CM | POA: Diagnosis present

## 2024-03-18 DIAGNOSIS — K219 Gastro-esophageal reflux disease without esophagitis: Secondary | ICD-10-CM | POA: Diagnosis present

## 2024-03-18 DIAGNOSIS — E119 Type 2 diabetes mellitus without complications: Secondary | ICD-10-CM | POA: Diagnosis present

## 2024-03-18 DIAGNOSIS — G4733 Obstructive sleep apnea (adult) (pediatric): Secondary | ICD-10-CM | POA: Diagnosis present

## 2024-03-18 DIAGNOSIS — I700 Atherosclerosis of aorta: Secondary | ICD-10-CM | POA: Diagnosis present

## 2024-03-18 DIAGNOSIS — I255 Ischemic cardiomyopathy: Secondary | ICD-10-CM | POA: Diagnosis present

## 2024-03-18 DIAGNOSIS — E871 Hypo-osmolality and hyponatremia: Secondary | ICD-10-CM | POA: Diagnosis present

## 2024-03-18 DIAGNOSIS — D509 Iron deficiency anemia, unspecified: Secondary | ICD-10-CM | POA: Diagnosis present

## 2024-03-18 DIAGNOSIS — X58XXXA Exposure to other specified factors, initial encounter: Secondary | ICD-10-CM | POA: Diagnosis present

## 2024-03-18 DIAGNOSIS — I5022 Chronic systolic (congestive) heart failure: Secondary | ICD-10-CM | POA: Diagnosis present

## 2024-03-18 DIAGNOSIS — Z7901 Long term (current) use of anticoagulants: Secondary | ICD-10-CM | POA: Diagnosis not present

## 2024-03-18 DIAGNOSIS — E785 Hyperlipidemia, unspecified: Secondary | ICD-10-CM | POA: Diagnosis present

## 2024-03-18 LAB — BASIC METABOLIC PANEL WITH GFR
Anion gap: 13 (ref 5–15)
BUN: 62 mg/dL — ABNORMAL HIGH (ref 8–23)
CO2: 27 mmol/L (ref 22–32)
Calcium: 9.5 mg/dL (ref 8.9–10.3)
Chloride: 90 mmol/L — ABNORMAL LOW (ref 98–111)
Creatinine, Ser: 2.83 mg/dL — ABNORMAL HIGH (ref 0.44–1.00)
GFR, Estimated: 18 mL/min — ABNORMAL LOW (ref >60)
Glucose, Bld: 128 mg/dL — ABNORMAL HIGH (ref 70–99)
Potassium: 4 mmol/L (ref 3.5–5.1)
Sodium: 130 mmol/L — ABNORMAL LOW (ref 135–145)

## 2024-03-18 LAB — CBC
HCT: 32.5 % — ABNORMAL LOW (ref 36.0–46.0)
Hemoglobin: 10.7 g/dL — ABNORMAL LOW (ref 12.0–15.0)
MCH: 33 pg (ref 26.0–34.0)
MCHC: 32.9 g/dL (ref 30.0–36.0)
MCV: 100.3 fL — ABNORMAL HIGH (ref 80.0–100.0)
Platelets: 122 10*3/uL — ABNORMAL LOW (ref 150–400)
RBC: 3.24 MIL/uL — ABNORMAL LOW (ref 3.87–5.11)
RDW: 21 % — ABNORMAL HIGH (ref 11.5–15.5)
WBC: 3.3 10*3/uL — ABNORMAL LOW (ref 4.0–10.5)
nRBC: 0 % (ref 0.0–0.2)

## 2024-03-18 LAB — GLUCOSE, CAPILLARY
Glucose-Capillary: 128 mg/dL — ABNORMAL HIGH (ref 70–99)
Glucose-Capillary: 155 mg/dL — ABNORMAL HIGH (ref 70–99)
Glucose-Capillary: 136 mg/dL — ABNORMAL HIGH (ref 70–99)
Glucose-Capillary: 122 mg/dL — ABNORMAL HIGH (ref 70–99)
Glucose-Capillary: 176 mg/dL — ABNORMAL HIGH (ref 70–99)

## 2024-03-18 LAB — MAGNESIUM: Magnesium: 2.3 mg/dL (ref 1.7–2.4)

## 2024-03-18 LAB — LIPASE, BLOOD: Lipase: 269 U/L — ABNORMAL HIGH (ref 11–51)

## 2024-03-18 NOTE — Progress Notes (Signed)
  Progress Note   Patient: Tammy Arias ZOX:[id number] DOB: [birth year] DOA: 03/17/2024     0 DOS: the patient was seen and examined on 03/18/2024    Assessment and Plan: AKI (acute kidney injury) (HCC) - IV NS 75 cc/hr   Dehydration: Patient is clinically dry -See above   CAD with hx of CABG: No chest pain -Continue Crestor    Atrial fibrillation, chronic (HCC): Heart rate 90-100 -Continue Eliquis    Chronic systolic CHF (congestive heart failure) (HCC): 2D echo on 03/07/2024 showed EF 35-40%.  No leg edema or SOB.  Clinically patient is dry. - IV fluids as above    HTN (hypertension): Blood pressure 109/62.  Patient is taking midodrine  for hypotension - Continue midodrine  10 mg 3 times daily with holding parameter of SBP > 130   Hyperlipidemia -Crestor    Hypothyroidism -Synthroid    Iron  deficiency anemia: Hemoglobin stable, 12.2 today -Continue iron  supplement   Hyponatremia: Mild.  Sodium 130. -On normal saline IV as above   Hypomagnesemia:  - Resolved    Gout -Continue home allopurinol  and as needed colchicine    GERD (gastroesophageal reflux disease) -Protonix  and Carafate    Elevated lipase:  - IV fluids and lipase in AM   OSA - on CPAP  Subjective: Pt seen and examined at the bedside. IV fluid rate has been modestly increased today as there has been slight improvement in her kidney function. Continue IV fluids today until BUN/Cr returns to normal (kidney function was wnl just 6 days ago).  Physical Exam: Vitals:   03/17/24 2300 03/18/24 0440 03/18/24 0510 03/18/24 0811  BP:   115/86 (!) 109/55  Pulse:   77 83  Resp:   18 16  Temp:   97.6 F (36.4 C) (!) 97.3 F (36.3 C)  TempSrc:      SpO2:   99% 99%  Weight: 49.1 kg 56.8 kg    Height: 5\' 1"  (1.549 m)      Physical Exam Constitutional:      Appearance: Normal appearance.  HENT:     Head: Normocephalic.     Mouth/Throat:     Mouth: Mucous membranes are dry.  Cardiovascular:     Rate and  Rhythm: Normal rate and regular rhythm.  Pulmonary:     Effort: Pulmonary effort is normal.  Abdominal:     Palpations: Abdomen is soft.  Musculoskeletal:        General: Normal range of motion.  Skin:    General: Skin is warm.  Neurological:     Mental Status: She is alert. Mental status is at baseline.  Psychiatric:        Mood and Affect: Mood normal.       Disposition: Status is: Inpatient Remains inpatient appropriate because: IV fluids and serial labs   Planned Discharge Destination: Home    Time spent: 35 minutes  Author: Patryce Depriest , MD 03/18/2024 12:19 PM  For on call review [email or web address].

## 2024-03-18 NOTE — Plan of Care (Signed)

## 2024-03-19 ENCOUNTER — Ambulatory Visit (HOSPITAL_COMMUNITY): Admitting: Nurse Practitioner

## 2024-03-19 DIAGNOSIS — I482 Chronic atrial fibrillation, unspecified: Secondary | ICD-10-CM | POA: Diagnosis not present

## 2024-03-19 DIAGNOSIS — N179 Acute kidney failure, unspecified: Secondary | ICD-10-CM | POA: Diagnosis not present

## 2024-03-19 DIAGNOSIS — E860 Dehydration: Secondary | ICD-10-CM | POA: Diagnosis not present

## 2024-03-19 DIAGNOSIS — I5022 Chronic systolic (congestive) heart failure: Secondary | ICD-10-CM | POA: Diagnosis not present

## 2024-03-19 LAB — COMPREHENSIVE METABOLIC PANEL WITH GFR
ALT: 22 U/L (ref 0–44)
AST: 39 U/L (ref 15–41)
Albumin: 3.8 g/dL (ref 3.5–5.0)
Alkaline Phosphatase: 237 U/L — ABNORMAL HIGH (ref 38–126)
Anion gap: 11 (ref 5–15)
BUN: 55 mg/dL — ABNORMAL HIGH (ref 8–23)
CO2: 24 mmol/L (ref 22–32)
Calcium: 9.6 mg/dL (ref 8.9–10.3)
Chloride: 97 mmol/L — ABNORMAL LOW (ref 98–111)
Creatinine, Ser: 1.47 mg/dL — ABNORMAL HIGH (ref 0.44–1.00)
GFR, Estimated: 38 mL/min — ABNORMAL LOW (ref >60)
Glucose, Bld: 142 mg/dL — ABNORMAL HIGH (ref 70–99)
Potassium: 4 mmol/L (ref 3.5–5.1)
Sodium: 132 mmol/L — ABNORMAL LOW (ref 135–145)
Total Bilirubin: 0.6 mg/dL (ref 0.0–1.2)
Total Protein: 7.2 g/dL (ref 6.5–8.1)

## 2024-03-19 LAB — CBC
HCT: 31.2 % — ABNORMAL LOW (ref 36.0–46.0)
Hemoglobin: 10.3 g/dL — ABNORMAL LOW (ref 12.0–15.0)
MCH: 33.2 pg (ref 26.0–34.0)
MCHC: 33 g/dL (ref 30.0–36.0)
MCV: 100.6 fL — ABNORMAL HIGH (ref 80.0–100.0)
Platelets: 112 10*3/uL — ABNORMAL LOW (ref 150–400)
RBC: 3.1 MIL/uL — ABNORMAL LOW (ref 3.87–5.11)
RDW: 21 % — ABNORMAL HIGH (ref 11.5–15.5)
WBC: 3 10*3/uL — ABNORMAL LOW (ref 4.0–10.5)
nRBC: 0 % (ref 0.0–0.2)

## 2024-03-19 LAB — GLUCOSE, CAPILLARY: Glucose-Capillary: 137 mg/dL — ABNORMAL HIGH (ref 70–99)

## 2024-03-19 LAB — MAGNESIUM: Magnesium: 2.2 mg/dL (ref 1.7–2.4)

## 2024-03-19 LAB — LIPASE, BLOOD: Lipase: 308 U/L — ABNORMAL HIGH (ref 11–51)

## 2024-03-19 MED ORDER — TORSEMIDE 20 MG PO TABS: 20.0000 mg | ORAL_TABLET | Freq: Every day | ORAL | 0 refills | Status: DC

## 2024-03-19 NOTE — Evaluation (Signed)
 Occupational Therapy Evaluation Patient Details Name: Tammy Arias MRN: [id number] DOB: June 27, 1956 Today's Date: 03/19/2024   History of Present Illness   Patient is a 68 year old female that returned to ED after recent admission for vomiting, dehydration, workup for AKI. Recent  exploratory laparotomy, total colectomy, and end ileostomy creation for septic shock secondary to C diff colitis. History of CAD s/p CABG, heart failure.     Clinical Impressions Upon entering the room, pt supine in bed and agreeable to OT evaluation. Pt reports being mod I at baseline with use of RW and having had recent hospital stay. She does endorse feeling much better since admission. Pt demonstrates ability ambulating, toilet transfer, clothing management, and hygiene without physical assistance or LOB. Pt returning to recliner chair at end of session with MD arriving for assessment. Call bell and all needed items within reach. Pt does not need skilled OT intervention at this time.        Equipment Recommendations   None recommended by OT      Precautions/Restrictions   Precautions Precautions: Fall Recall of Precautions/Restrictions: Intact Precaution/Restrictions Comments: ostomy     Mobility Bed Mobility Overal bed mobility: Modified Independent                  Transfers Overall transfer level: Modified independent Equipment used: Rolling walker (2 wheels)                      Balance Overall balance assessment: Modified Independent                                         ADL either performed or assessed with clinical judgement   ADL Overall ADL's : Modified independent                                             Vision Baseline Vision/History: 1 Wears glasses Patient Visual Report: No change from baseline              Pertinent Vitals/Pain Pain Assessment Pain Assessment: No/denies pain     Extremity/Trunk  Assessment Upper Extremity Assessment Upper Extremity Assessment: Overall WFL for tasks assessed   Lower Extremity Assessment Lower Extremity Assessment: Overall WFL for tasks assessed       Communication Communication Communication: No apparent difficulties   Cognition Arousal: Alert Behavior During Therapy: WFL for tasks assessed/performed Cognition: No apparent impairments                               Following commands: Intact       Cueing  General Comments   Cueing Techniques: Verbal cues              Home Living Family/patient expects to be discharged to:: Private residence Living Arrangements: Alone Available Help at Discharge: Friend(s);Available 24 hours/day Type of Home: House Home Access: Stairs to enter [employer] of Steps: 4 Entrance Stairs-Rails: Right;Left Home Layout: One level     Bathroom Shower/Tub: Chief Strategy Officer: Standard     Home Equipment: Agricultural consultant (2 wheels);Cane - single point   Additional Comments: the above information is at her friend's home where patient will be going  at discharge. The patient's home has no steps to etner and a walk in shower      Prior Functioning/Environment Prior Level of Function : Independent/Modified Independent             Mobility Comments: intermittently using cane for ambulation ADLs Comments: Mod I for ADLs an IADLs            OT Goals(Current goals can be found in the care plan section)   Acute Rehab OT Goals Patient Stated Goal: to go home OT Goal Formulation: With patient Time For Goal Achievement: 03/19/24 Potential to Achieve Goals: Fair   AM-PAC OT "6 Clicks" Daily Activity     Outcome Measure Help from another person eating meals?: None Help from another person taking care of personal grooming?: None Help from another person toileting, which includes using toliet, bedpan, or urinal?: None Help from another person bathing  (including washing, rinsing, drying)?: None Help from another person to put on and taking off regular upper body clothing?: None Help from another person to put on and taking off regular lower body clothing?: None 6 Click Score: 24   End of Session Equipment Utilized During Treatment: Rolling walker (2 wheels) Nurse Communication: Mobility status  Activity Tolerance: Patient tolerated treatment well Patient left: with call bell/phone within reach;with family/visitor present;in bed;with nursing/sitter in room                   Time: 0957-1007 OT Time Calculation (min): 10 min Charges:  OT General Charges $OT Visit: 1 Visit OT Evaluation $OT Eval Low Complexity: 1 Low  George Kinder, MS, OTR/L , CBIS ascom [phone]  03/19/24, 12:38 PM

## 2024-03-19 NOTE — TOC Transition Note (Signed)
 Transition of Care [facility]) - Discharge Note   Patient Details  Name: Tammy Arias MRN: [id number] Date of Birth: 1956-02-06  Transition of Care [facility]) CM/SW Contact:  Odilia Bennett, LCSW Phone Number: 03/19/2024, 10:53 AM   Clinical Narrative:   Patient has orders to discharge home today. [facility] liaison is aware. She was active with PT, OT, RN prior to admission. This CSW completed readmission prevention screen prior to admission:   "Readmission prevention screen complete. Patient is AOx2 but friend [name] is at bedside. PCP is Lyle San, MD. Patient was driving herself to appointments but has supports that can drive her to appointments if needed. She uses [facility]. No issues obtaining medications. Patient lives home alone but [name] and/or her daughter either stay with her or have her stay with them if needed. Patient has a son that lives in [place] . Patient has rails at toilet, a new walk-in shower but no seat, a RW, cane, and CPAP at home. She is not on oxygen at home. No further concerns. CSW will continue to follow patient and [name] for support and facilitate return home once stable. [name] will transport her home at discharge."   No further concerns. CSW signing off.  Final next level of care: Home w Home Health Services Barriers to Discharge: No Barriers Identified   Patient Goals and CMS Choice            Discharge Placement                    Patient and family notified of of transfer: 03/19/24  Discharge Plan and Services Additional resources added to the After Visit Summary for                            [facility] Arranged: RN, PT, OT [facility] Agency: [facility] Date [facility] Agency Contacted: 03/19/24   Representative spoke with at [facility] Agency: Bartholomew Light  Social Drivers of Health (SDOH) Interventions SDOH Screenings   Food Insecurity: No Food Insecurity (03/17/2024)  Housing: Low Risk  (03/17/2024)  Recent Concern: Housing -  High Risk (02/28/2024)  Transportation Needs: No Transportation Needs (03/17/2024)  Utilities: Not At Risk (03/17/2024)  Financial Resource Strain: Low Risk  (02/11/2024)   Received from [facility]  Recent Concern: Financial Resource Strain - Medium Risk (12/13/2023)   Received from [facility]  Social Connections: Moderately Integrated (03/17/2024)  Tobacco Use: Medium Risk (03/17/2024)     Readmission Risk Interventions    03/19/2024   10:52 AM 03/01/2024   12:07 PM 02/29/2024   11:10 AM  Readmission Risk Prevention Plan  Transportation Screening Complete Complete   PCP or Specialist Appt within 3-5 Days Complete Complete Complete  HRI or Home Care Consult Complete Complete Complete  Social Work Consult for Recovery Care Planning/Counseling Complete Complete Complete  Palliative Care Screening Not Applicable Not Applicable Not Applicable  Medication Review Oceanographer) Complete Complete

## 2024-03-19 NOTE — Plan of Care (Signed)
  Problem: Skin Integrity: Goal: Risk for impaired skin integrity will decrease Outcome: Progressing   Problem: Elimination: Goal: Will not experience complications related to bowel motility Outcome: Progressing   Problem: Safety: Goal: Ability to remain free from injury will improve Outcome: Progressing   Problem: Clinical Measurements: Goal: Cardiovascular complication will be avoided Outcome: Progressing

## 2024-03-19 NOTE — Hospital Course (Addendum)
 Taken from prior notes.   Tammy Arias is a 68 y.o. female with medical history significant of HTN,  HLD, DM, CAD with CABG, sCHF with EF35-40%, TIA, hypothyroidism, gout, GERD, GI bleeding, AVM, liver cirrhosis, OSA on CPAP, anemia, A-fib on Eliquis  (s/p of Watchman procedure), pulmonary hypertension, IBS, Cor Atrarium (s/p of repair), lumbar stenosis, recent admission due to pancolitis and C. difficile colitis (s/p of colectomy end ileostomy), who presents with weakness.   Patient was recently hospitalized from 4/1 - 4/15 due to severe sepsis with septic shock secondary to pancolitis and C. difficile colitis.  Patient is s/p of colectomy and ileostomy. At discharge, her torsemide  dose was increased from 20 to 40 mg twice daily. She is also taking spironolactone  25 mg daily.  Patient states that she was instructed to take 40 mg torsemide  twice daily for 3 days, but she took extra dose due to leg edema. She lost more than 10 pounds over the past 5 days.    On presentations vitals were stable, found to have AKI with creatinine of 2.98, BUN 65, baseline creatinine less than 1,WBC 4.5, magnesium  1.6, potassium 4.7, BNP 166.8.  CT of abdomen/pelvis: 1. 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 No bowel obstruction. 2. Small volume free fluid in the pelvis is likely postoperative. 3. Cholelithiasis without evidence of acute cholecystitis. 4. Aortic Atherosclerosis (ICD10-I70.0).   EKG: A-fib, QTc 463, LAD, T wave inversion in precordial leads.  Patient was admitted for AKI likely due to overdiuresis.  Diuretics were held and patient was given IV fluid.  4/22: Vitals stable.  Hypomagnesemia resolved, renal functions continue to improve with creatinine at 1.47 today.  PT is recommending home health.  Patient is at baseline and had her outpatient medical appointment scheduled which she does not want to miss.  She was advised to hold diuretic for another day, she can resume spironolactone   at her normal dose on Thursday and torsemide  at 20 mg daily from Thursday.  She had her cardiology appointment coming up and they can adjust the dose as appropriate. Patient clinically appears dry.  She will continue taking rest of your home medications and follow-up with her providers for further assistance.

## 2024-03-19 NOTE — Discharge Summary (Signed)
 Physician Discharge Summary   Patient: Tammy Arias MRN: [id number] DOB: 04/24/1956  Admit date:     03/17/2024  Discharge date: 03/19/24  Discharge Physician: Luna Salinas   PCP: Lyle San, MD   Recommendations at discharge:  Please obtain CBC and BMP on follow-up We decreased the dose of torsemide  to 20 mg daily as patient had AKI due to overdiuresis, please adjust as appropriate Follow-up with primary care provider Follow-up with cardiology  Discharge Diagnoses: Principal Problem:   AKI (acute kidney injury) (HCC) Active Problems:   Dehydration   CAD with hx of CABG   Atrial fibrillation, chronic (HCC)   Chronic systolic CHF (congestive heart failure) (HCC)   HTN (hypertension)   Hyperlipidemia   Hypothyroidism   Iron  deficiency anemia   Hyponatremia   Hypomagnesemia   Gout   GERD (gastroesophageal reflux disease)   Elevated lipase   OSA on CPAP   Hospital Course: Taken from prior notes.   Tammy Arias is a 68 y.o. female with medical history significant of HTN,  HLD, DM, CAD with CABG, sCHF with EF35-40%, TIA, hypothyroidism, gout, GERD, GI bleeding, AVM, liver cirrhosis, OSA on CPAP, anemia, A-fib on Eliquis  (s/p of Watchman procedure), pulmonary hypertension, IBS, Cor Atrarium (s/p of repair), lumbar stenosis, recent admission due to pancolitis and C. difficile colitis (s/p of colectomy end ileostomy), who presents with weakness.   Patient was recently hospitalized from 4/1 - 4/15 due to severe sepsis with septic shock secondary to pancolitis and C. difficile colitis.  Patient is s/p of colectomy and ileostomy. At discharge, her torsemide  dose was increased from 20 to 40 mg twice daily. She is also taking spironolactone  25 mg daily.  Patient states that she was instructed to take 40 mg torsemide  twice daily for 3 days, but she took extra dose due to leg edema. She lost more than 10 pounds over the past 5 days.    On presentations vitals were stable, found to  have AKI with creatinine of 2.98, BUN 65, baseline creatinine less than 1,WBC 4.5, magnesium  1.6, potassium 4.7, BNP 166.8.  CT of abdomen/pelvis: 1. 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 No bowel obstruction. 2. Small volume free fluid in the pelvis is likely postoperative. 3. Cholelithiasis without evidence of acute cholecystitis. 4. Aortic Atherosclerosis (ICD10-I70.0).   EKG: A-fib, QTc 463, LAD, T wave inversion in precordial leads.  Patient was admitted for AKI likely due to overdiuresis.  Diuretics were held and patient was given IV fluid.  4/22: Vitals stable.  Hypomagnesemia resolved, renal functions continue to improve with creatinine at 1.47 today.  PT is recommending home health.  Patient is at baseline and had her outpatient medical appointment scheduled which she does not want to miss.  She was advised to hold diuretic for another day, she can resume spironolactone  at her normal dose on Thursday and torsemide  at 20 mg daily from Thursday.  She had her cardiology appointment coming up and they can adjust the dose as appropriate. Patient clinically appears dry.  She will continue taking rest of your home medications and follow-up with her providers for further assistance.  Consultants: None Procedures performed: None Disposition: Home health Diet recommendation:  Discharge Diet Orders (From admission, onward)     Start     Ordered   03/19/24 0000  Diet - low sodium heart healthy        03/19/24 1013           Cardiac diet DISCHARGE MEDICATION: Allergies  as of 03/19/2024       Reactions   Vioxx [rofecoxib] Swelling        Medication List     STOP taking these medications    cyanocobalamin  1000 MCG tablet Commonly known as: VITAMIN B12   valsartan  40 MG tablet Commonly known as: DIOVAN        TAKE these medications    albuterol  108 (90 Base) MCG/ACT inhaler Commonly known as: VENTOLIN  HFA Inhale 2 puffs into the lungs every 6  (six) hours as needed for wheezing or shortness of breath.   allopurinol  300 MG tablet Commonly known as: ZYLOPRIM  Take 300 mg by mouth daily.   ascorbic acid  500 MG tablet Commonly known as: VITAMIN C  Take 1 tablet (500 mg total) by mouth daily.   colchicine  0.6 MG tablet Take 0.6-1.8 mg by mouth as needed (take 1.2 mg (2 tablets) at onset of gout flare, Take one additional tablet one hour later if symptoms persist. Maximum of 3 tablets per day).   Eliquis  5 MG Tabs tablet Generic drug: apixaban  Take 5 mg by mouth 2 (two) times daily.   iron  polysaccharides 150 MG capsule Commonly known as: NIFEREX Take 1 capsule (150 mg total) by mouth daily.   Jardiance 10 MG Tabs tablet Generic drug: empagliflozin Take 10 mg by mouth daily.   latanoprost  0.005 % ophthalmic solution Commonly known as: XALATAN  Place 1 drop into both eyes at bedtime.   levothyroxine  50 MCG tablet Commonly known as: SYNTHROID  Take 50 mcg by mouth daily before breakfast.   metFORMIN  1000 MG tablet Commonly known as: GLUCOPHAGE  Take 1,000 mg by mouth 2 (two) times daily with a meal.   midodrine  10 MG tablet Commonly known as: PROAMATINE  Take 1 tablet (10 mg total) by mouth 3 (three) times daily with meals.   pantoprazole  40 MG tablet Commonly known as: PROTONIX  Take 40 mg by mouth daily.   rosuvastatin  5 MG tablet Commonly known as: CRESTOR  Take 5 mg by mouth daily.   spironolactone  25 MG tablet Commonly known as: ALDACTONE  Take 25 mg by mouth daily.   sucralfate  1 g tablet Commonly known as: CARAFATE  Take 1 g by mouth 4 (four) times daily -  with meals and at bedtime.   torsemide  20 MG tablet Commonly known as: DEMADEX  Take 1 tablet (20 mg total) by mouth daily. What changed:  when to take this Another medication with the same name was removed. Continue taking this medication, and follow the directions you see here.        Follow-up Information     Lyle San, MD. Schedule an  appointment as soon as possible for a visit in 1 week(s).   Specialty: Family Medicine Contact information: [address] [place] [address] [phone]                Discharge Exam: Tammy Arias Weights   03/17/24 2300 03/18/24 0440 03/19/24 0607  Weight: 49.1 kg 56.8 kg 56.5 kg   General.  Frail lady, in no acute distress. Pulmonary.  Lungs clear bilaterally, normal respiratory effort. CV.  Regular rate and rhythm, no JVD, rub or murmur. Abdomen.  Soft, nontender, nondistended, BS positive. CNS.  Alert and oriented .  No focal neurologic deficit. Extremities.  No edema, no cyanosis, pulses intact and symmetrical. Psychiatry.  Judgment and insight appears normal.   Condition at discharge: stable  The results of significant diagnostics from this hospitalization (including imaging, microbiology, ancillary and laboratory) are listed below  for reference.   Imaging Studies: CT ABDOMEN PELVIS WO CONTRAST Result Date: 03/17/2024 CLINICAL DATA:  Postop emesis.  Recent colectomy.  Abdominal pain. EXAM: CT ABDOMEN AND PELVIS WITHOUT CONTRAST TECHNIQUE: Multidetector CT imaging of the abdomen and pelvis was performed following the standard protocol without IV contrast. RADIATION DOSE REDUCTION: This exam was performed according to the departmental dose-optimization program which includes automated exposure control, adjustment of the mA and/or kV according to patient size and/or use of iterative reconstruction technique. COMPARISON:  CT abdomen pelvis 02/27/2024 FINDINGS: Lower chest: Cardiomegaly.  No acute abnormality. Hepatobiliary: Cholelithiasis without evidence of acute cholecystitis. Unremarkable noncontrast appearance of the liver. Pancreas: Unremarkable. Spleen: Unremarkable. Adrenals/Urinary Tract: Stable adrenal glands. No urinary calculi or hydronephrosis. Unremarkable bladder. 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 Right lower  quadrant ileostomy. Enteric contrast is visualized within the stomach and small bowel. No bowel wall thickening or obstruction. Vascular/Lymphatic: Aortic atherosclerosis. No enlarged abdominal or pelvic lymph nodes. Reproductive: Uterus and bilateral adnexa are unremarkable. Other: Small volume free fluid in the pelvis is likely postoperative. No free intraperitoneal air. Musculoskeletal: No acute fracture. IMPRESSION: 1. 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 No bowel obstruction. 2. Small volume free fluid in the pelvis is likely postoperative. 3. Cholelithiasis without evidence of acute cholecystitis. 4. Aortic Atherosclerosis (ICD10-I70.0). Electronically Signed   By: Rozell Cornet M.D.   On: 03/17/2024 20:45   ECHOCARDIOGRAM COMPLETE Result Date: 03/07/2024    ECHOCARDIOGRAM REPORT   Patient Name:   HAIDYN KILBURG [facility] Date of Exam: 03/07/2024 Medical Rec #:  [id number]       Height:       61.0 in Accession #:    [id number]      Weight:       143.1 lb Date of Birth:  1956-08-23       BSA:          1.638 m Patient Age:    68 years        BP:           104/68 mmHg Patient Gender: F               HR:           99 bpm. Exam Location:  [facility] Procedure: 2D Echo, Cardiac Doppler and Color Doppler (Both Spectral and Color            Flow Doppler were utilized during procedure). Indications:     CHF--acute systolic I50.21  History:         Patient has no prior history of Echocardiogram examinations.                  CHF, Arrythmias:Atrial Fibrillation; Risk Factors:Hypertension.                  Migraines.  Sonographer:     Broadus Canes Referring Phys:  [id number] DILEEP KUMAR Diagnosing Phys: Lida Reeks Alluri IMPRESSIONS  1. Left ventricular ejection fraction, by estimation, is 35 to 40%. The left ventricle has moderately decreased function. The left ventricle demonstrates global hypokinesis. Left ventricular diastolic parameters are indeterminate.  2. Right ventricular systolic function is moderately reduced.  The right ventricular size is mildly enlarged. There is moderately elevated pulmonary artery systolic pressure. The estimated right ventricular systolic pressure is 55.9 mmHg.  3. Left atrial size was severely dilated.  4. Right atrial size was severely dilated.  5. The mitral valve is grossly normal. Severe mitral valve regurgitation.  6. Tricuspid  valve regurgitation is severe.  7. The aortic valve was not well visualized. Aortic valve regurgitation is trivial. FINDINGS  Left Ventricle: Left ventricular ejection fraction, by estimation, is 35 to 40%. The left ventricle has moderately decreased function. The left ventricle demonstrates global hypokinesis. The left ventricular internal cavity size was normal in size. There is no left ventricular hypertrophy. Left ventricular diastolic parameters are indeterminate. Right Ventricle: The right ventricular size is mildly enlarged. No increase in right ventricular wall thickness. Right ventricular systolic function is moderately reduced. There is moderately elevated pulmonary artery systolic pressure. The tricuspid regurgitant velocity is 3.46 m/s, and with an assumed right atrial pressure of 8 mmHg, the estimated right ventricular systolic pressure is 55.9 mmHg. Left Atrium: Left atrial size was severely dilated. Right Atrium: Right atrial size was severely dilated. Pericardium: There is no evidence of pericardial effusion. Mitral Valve: The mitral valve is grossly normal. There is mild thickening of the mitral valve leaflet(s). Severe mitral valve regurgitation. Tricuspid Valve: The tricuspid valve is grossly normal. Tricuspid valve regurgitation is severe. Aortic Valve: The aortic valve was not well visualized. Aortic valve regurgitation is trivial. Aortic valve mean gradient measures 3.3 mmHg. Aortic valve peak gradient measures 5.6 mmHg. Aortic valve area, by VTI measures 1.67 cm. Pulmonic Valve: The pulmonic valve was not well visualized. Pulmonic valve  regurgitation is trivial. Aorta: The aortic root is normal in size and structure. IAS/Shunts: The atrial septum is grossly normal.  LEFT VENTRICLE PLAX 2D LVIDd:         5.30 cm LVIDs:         4.50 cm LV PW:         1.10 cm LV IVS:        0.90 cm LVOT diam:     2.00 cm LV SV:         36 LV SV Index:   22 LVOT Area:     3.14 cm  RIGHT VENTRICLE RV Basal diam:  4.60 cm RV Mid diam:    4.00 cm RV S prime:     6.74 cm/s TAPSE (M-mode): 1.1 cm LEFT ATRIUM             Index        RIGHT ATRIUM           Index LA diam:        5.60 cm 3.42 cm/m   RA Area:     26.50 cm LA Vol (A2C):   64.4 ml 39.31 ml/m  RA Volume:   96.00 ml  58.60 ml/m LA Vol (A4C):   77.8 ml 47.49 ml/m LA Biplane Vol: 71.8 ml 43.83 ml/m  AORTIC VALVE AV Area (Vmax):    1.54 cm AV Area (Vmean):   1.64 cm AV Area (VTI):     1.67 cm AV Vmax:           118.63 cm/s AV Vmean:          81.233 cm/s AV VTI:            0.216 m AV Peak Grad:      5.6 mmHg AV Mean Grad:      3.3 mmHg LVOT Vmax:         58.30 cm/s LVOT Vmean:        42.400 cm/s LVOT VTI:          0.115 m LVOT/AV VTI ratio: 0.53  AORTA Ao Root diam: 2.60 cm MITRAL VALVE  TRICUSPID VALVE MV Area (PHT): 4.36 cm     TR Peak grad:   47.9 mmHg MV Decel Time: 174 msec     TR Vmax:        346.00 cm/s MV E velocity: 147.00 cm/s                             SHUNTS                             Systemic VTI:  0.12 m                             Systemic Diam: 2.00 cm Joetta Mustache Electronically signed by Joetta Mustache Signature Date/Time: 03/07/2024/5:26:18 PM    Final    DG Chest Port 1 View Result Date: 02/28/2024 CLINICAL DATA:  Intubated EXAM: PORTABLE CHEST 1 VIEW COMPARISON:  02/27/2024 FINDINGS: Single frontal view of the chest demonstrates endotracheal tube overlying tracheal air column, tip just below thoracic inlet. Enteric catheter passes below diaphragm, tip and side port projecting over the gastric fundus. Right internal jugular catheter tip overlies the right atrium.  Abandoned epicardial pacing wires are noted. Occlusion device in the region of the left atrial appendage unchanged. Cardiac silhouette is mildly enlarged but stable. No acute airspace disease, effusion, or pneumothorax. No acute bony abnormalities. IMPRESSION: 1. Support devices as above. 2. No acute airspace disease. Electronically Signed   By: Bobbye Burrow M.D.   On: 02/28/2024 17:05   CT ABDOMEN PELVIS WO CONTRAST Result Date: 02/27/2024 CLINICAL DATA:  Diarrhea, sepsis, vomiting EXAM: CT ABDOMEN AND PELVIS WITHOUT CONTRAST TECHNIQUE: Multidetector CT imaging of the abdomen and pelvis was performed following the standard protocol without IV contrast. RADIATION DOSE REDUCTION: This exam was performed according to the departmental dose-optimization program which includes automated exposure control, adjustment of the mA and/or kV according to patient size and/or use of iterative reconstruction technique. COMPARISON:  06/06/2023 FINDINGS: Lower chest: Cardiomegaly. Minimal right base linear atelectasis or scarring. No effusions. Hepatobiliary: Layering gallstones within the gallbladder, stable. No focal hepatic abnormality or biliary ductal dilatation. Pancreas: No focal abnormality or ductal dilatation. Spleen: No focal abnormality.  Normal size. Adrenals/Urinary Tract: No adrenal abnormality. No focal renal abnormality. No stones or hydronephrosis. Urinary bladder is unremarkable. Stomach/Bowel: Diffuse colonic wall thickening and surrounding inflammation compatible with pancolitis. Stomach and small bowel decompressed. No bowel obstruction. Vascular/Lymphatic: No evidence of aneurysm or adenopathy. Aortic atherosclerosis. Reproductive: Uterus and adnexa unremarkable.  No mass. Other: No free fluid or free air. Musculoskeletal: No acute bony abnormality. IMPRESSION: Diffuse colonic wall thickening and surrounding inflammation compatible with pancolitis. Cholelithiasis. Aortic atherosclerosis. Electronically  Signed   By: Janeece Mechanic M.D.   On: 02/27/2024 21:42   DG Chest Port 1 View Result Date: 02/27/2024 CLINICAL DATA:  Central line placement EXAM: PORTABLE CHEST 1 VIEW COMPARISON:  12/17/2018 FINDINGS: Right central line tip in the right atrium. No pneumothorax. Prior median sternotomy. Heart is borderline in size. No confluent airspace opacities or effusions. No acute bony abnormality. IMPRESSION: Right central line tip in the right atrium.  No pneumothorax. No acute cardiopulmonary disease. Electronically Signed   By: Janeece Mechanic M.D.   On: 02/27/2024 20:36    Microbiology: Results for orders placed or performed during the hospital encounter of 02/27/24  C Difficile Quick Screen w PCR reflex  Status: Abnormal   Collection Time: 02/27/24  7:34 PM   Specimen: STOOL  Result Value Ref Range Status   C Diff antigen POSITIVE (A) NEGATIVE Final   C Diff toxin POSITIVE (A) NEGATIVE Final   C Diff interpretation Toxin producing C. difficile detected.  Final    Comment: CRITICAL RESULT CALLED TO, READ BACK BY AND VERIFIED WITH: AMANDA FOX RN @[id number]  02/27/24 ASW Performed at [facility], [address] [place], [place] [address]   Gastrointestinal Panel by PCR , Stool     Status: None   Collection Time: 02/27/24  7:34 PM   Specimen: STOOL  Result Value Ref Range Status   Campylobacter species NOT DETECTED NOT DETECTED Final   Plesimonas shigelloides NOT DETECTED NOT DETECTED Final   Salmonella species NOT DETECTED NOT DETECTED Final   Yersinia enterocolitica NOT DETECTED NOT DETECTED Final   Vibrio species NOT DETECTED NOT DETECTED Final   Vibrio cholerae NOT DETECTED NOT DETECTED Final   Enteroaggregative E coli (EAEC) NOT DETECTED NOT DETECTED Final   Enteropathogenic E coli (EPEC) NOT DETECTED NOT DETECTED Final   Enterotoxigenic E coli (ETEC) NOT DETECTED NOT DETECTED Final   Shiga like toxin producing E coli (STEC) NOT DETECTED NOT DETECTED Final   Shigella/Enteroinvasive E  coli (EIEC) NOT DETECTED NOT DETECTED Final   Cryptosporidium NOT DETECTED NOT DETECTED Final   Cyclospora cayetanensis NOT DETECTED NOT DETECTED Final   Entamoeba histolytica NOT DETECTED NOT DETECTED Final   Giardia lamblia NOT DETECTED NOT DETECTED Final   Adenovirus F40/41 NOT DETECTED NOT DETECTED Final   Astrovirus NOT DETECTED NOT DETECTED Final   Norovirus GI/GII NOT DETECTED NOT DETECTED Final   Rotavirus A NOT DETECTED NOT DETECTED Final   Sapovirus (I, II, IV, and V) NOT DETECTED NOT DETECTED Final    Comment: Performed at [facility], [address] [place], [place] [address]  MRSA Next Gen by PCR, Nasal     Status: None   Collection Time: 02/27/24 10:01 PM   Specimen: Peripheral; Nasal Swab  Result Value Ref Range Status   MRSA by PCR Next Gen NOT DETECTED NOT DETECTED Final    Comment: (NOTE) The GeneXpert MRSA Assay (FDA approved for NASAL specimens only), is one component of a comprehensive MRSA colonization surveillance program. It is not intended to diagnose MRSA infection nor to guide or monitor treatment for MRSA infections. Test performance is not FDA approved in patients less than 69 years old. Performed at [facility], [address] [place], [place] [address]   Culture, blood (Routine X 2) w Reflex to ID Panel     Status: None   Collection Time: 02/28/24  2:17 AM   Specimen: BLOOD LEFT HAND  Result Value Ref Range Status   Specimen Description BLOOD LEFT HAND  Final   Special Requests   Final    BOTTLES DRAWN AEROBIC AND ANAEROBIC Blood Culture results may not be optimal due to an inadequate volume of blood received in culture bottles   Culture   Final    NO GROWTH 5 DAYS Performed at [facility], [address] [place], [place] [address]    Report Status 03/04/2024 FINAL  Final  Culture, blood (Routine X 2) w Reflex to ID Panel     Status: None   Collection Time: 02/28/24  2:17 AM   Specimen: BLOOD LEFT ARM  Result  Value Ref Range Status   Specimen Description BLOOD LEFT ARM  Final  Special Requests   Final    BOTTLES DRAWN AEROBIC AND ANAEROBIC Blood Culture results may not be optimal due to an inadequate volume of blood received in culture bottles   Culture   Final    NO GROWTH 5 DAYS Performed at [facility], [address] [place], [place] [address]    Report Status 03/04/2024 FINAL  Final    Labs: CBC: Recent Labs  Lab 03/17/24 1723 03/18/24 0620 03/19/24 0608  WBC 4.5 3.3* 3.0*  HGB 12.2 10.7* 10.3*  HCT 38.2 32.5* 31.2*  MCV 103.2* 100.3* 100.6*  PLT 144* 122* 112*   Basic Metabolic Panel: Recent Labs  Lab 03/17/24 1723 03/18/24 0620 03/19/24 0608  NA 130* 130* 132*  K 4.7 4.0 4.0  CL 86* 90* 97*  CO2 25 27 24   GLUCOSE 149* 128* 142*  BUN 65* 62* 55*  CREATININE 2.98* 2.83* 1.47*  CALCIUM  10.3 9.5 9.6  MG 1.6* 2.3 2.2  PHOS 4.2  --   --    Liver Function Tests: Recent Labs  Lab 03/17/24 1723 03/19/24 0608  AST 49* 39  ALT 29 22  ALKPHOS 341* 237*  BILITOT 1.0 0.6  PROT 9.8* 7.2  ALBUMIN  4.9 3.8   CBG: Recent Labs  Lab 03/18/24 1152 03/18/24 1400 03/18/24 1744 03/18/24 2154 03/19/24 0749  GLUCAP 155* 136* 122* 176* 137*    Discharge time spent: greater than 30 minutes.  This record has been created using Conservation officer, historic buildings. Errors have been sought and corrected,but may not always be located. Such creation errors do not reflect on the standard of care.   Signed: Luna Salinas, MD [facility] 03/19/2024

## 2024-03-19 NOTE — Progress Notes (Signed)
 Aleen Ammons RN reviewed discharge instructions with patient. Questions were encouraged and answered.

## 2024-03-19 NOTE — Evaluation (Signed)
 Physical Therapy Evaluation Patient Details Name: Tammy Arias MRN: [id number] DOB: 14-Oct-1956 Today's Date: 03/19/2024  History of Present Illness  Patient is a 68 year old female that returned to ED after recent admission for vomiting, dehydration, workup for AKI. Recent  exploratory laparotomy, total colectomy, and end ileostomy creation for septic shock secondary to C diff colitis. History of CAD s/p CABG, heart failure.  Clinical Impression  Pt A&Ox4, reported feeling better since admission. Per pt she will be returning to her friends home at discharge, had been using her RW and was starting HHPT (pt previously independent). She was able to perform bed mobility and transfers modI, ambulated ~49ft with RW, supervision. Pt still reporting fatigue and demonstrated decreased activity tolerance/endurance from independent baseline. Would benefit from further skilled PT intervention to return to PLOF.         If plan is discharge home, recommend the following: A little help with bathing/dressing/bathroom;Assist for transportation;Help with stairs or ramp for entrance   Can travel by private vehicle        Equipment Recommendations None recommended by PT  Recommendations for Other Services       Functional Status Assessment Patient has had a recent decline in their functional status and demonstrates the ability to make significant improvements in function in a reasonable and predictable amount of time.     Precautions / Restrictions Precautions Precautions: Fall Recall of Precautions/Restrictions: Intact Precaution/Restrictions Comments: ostomy Restrictions Weight Bearing Restrictions Per Provider Order: No      Mobility  Bed Mobility Overal bed mobility: Modified Independent                  Transfers Overall transfer level: Modified independent Equipment used: Rolling walker (2 wheels) Transfers: Sit to/from Stand                   Ambulation/Gait Ambulation/Gait assistance: Supervision Gait Distance (Feet): 400 Feet Assistive device: Rolling walker (2 wheels)         General Gait Details: some decreased gait velocity but overall steady, safe no LOB  Stairs            Wheelchair Mobility     Tilt Bed    Modified Rankin (Stroke Patients Only)       Balance Overall balance assessment: Needs assistance Sitting-balance support: Feet supported Sitting balance-Leahy Scale: Good     Standing balance support: Bilateral upper extremity supported, Reliant on assistive device for balance, During functional activity Standing balance-Leahy Scale: Good                               Pertinent Vitals/Pain Pain Assessment Pain Assessment: No/denies pain    Home Living Family/patient expects to be discharged to:: Private residence (pt will be staying with her friend) Living Arrangements: Alone Available Help at Discharge: Friend(s);Available 24 hours/day Type of Home: House Home Access: Stairs to enter Entrance Stairs-Rails: Doctor, general practice of Steps: 4   Home Layout: One level Home Equipment: Agricultural consultant (2 wheels);Cane - single point Additional Comments: the above information is at her friend's home where patient will be going at discharge. The patient's home has no steps to etner and a walk in shower    Prior Function Prior Level of Function : Independent/Modified Independent             Mobility Comments: intermittently using cane for ambulation ADLs Comments: Mod I for ADLs an IADLs  Extremity/Trunk Assessment   Upper Extremity Assessment Upper Extremity Assessment: Overall WFL for tasks assessed    Lower Extremity Assessment Lower Extremity Assessment:  (LE movements symmetrical, able to move independently against gravity)       Communication        Cognition Arousal: Alert Behavior During Therapy: WFL for tasks assessed/performed    PT - Cognitive impairments: No apparent impairments                                 Cueing       General Comments      Exercises     Assessment/Plan    PT Assessment Patient needs continued PT services  PT Problem List Decreased strength;Decreased range of motion;Decreased activity tolerance;Decreased balance;Decreased mobility       PT Treatment Interventions DME instruction;Gait training;Stair training;Functional mobility training;Therapeutic activities;Therapeutic exercise;Balance training;Patient/family education    PT Goals (Current goals can be found in the Care Plan section)  Acute Rehab PT Goals Patient Stated Goal: to go home PT Goal Formulation: With patient Time For Goal Achievement: 04/02/24 Potential to Achieve Goals: Good    Frequency Min 1X/week     Co-evaluation               AM-PAC PT "6 Clicks" Mobility  Outcome Measure Help needed turning from your back to your side while in a flat bed without using bedrails?: None Help needed moving from lying on your back to sitting on the side of a flat bed without using bedrails?: None Help needed moving to and from a bed to a chair (including a wheelchair)?: None Help needed standing up from a chair using your arms (e.g., wheelchair or bedside chair)?: None Help needed to walk in hospital room?: None Help needed climbing 3-5 steps with a railing? : A Little 6 Click Score: 23    End of Session   Activity Tolerance: Patient tolerated treatment well Patient left: Other (comment) (seated EOB with visitor) Nurse Communication: Mobility status PT Visit Diagnosis: Muscle weakness (generalized) (M62.81);Unsteadiness on feet (R26.81)    Time: 4540-9811 PT Time Calculation (min) (ACUTE ONLY): 13 min   Charges:   PT Evaluation $PT Eval Low Complexity: 1 Low PT Treatments $Therapeutic Activity: 8-22 mins PT General Charges $$ ACUTE PT VISIT: 1 Visit        Darien Eden PT, DPT 9:58  AM,03/19/24

## 2024-03-20 ENCOUNTER — Ambulatory Visit: Payer: Medicare Other | Admitting: Oncology

## 2024-03-20 ENCOUNTER — Other Ambulatory Visit: Payer: Medicare Other

## 2024-03-26 ENCOUNTER — Other Ambulatory Visit: Payer: Self-pay

## 2024-03-26 ENCOUNTER — Emergency Department
Admission: EM | Admit: 2024-03-26 | Discharge: 2024-03-26 | Disposition: A | Discharge status: Home / Self Care | Source: Home / Self Care | Attending: Emergency Medicine | Admitting: Emergency Medicine

## 2024-03-26 DIAGNOSIS — Z951 Presence of aortocoronary bypass graft: Secondary | ICD-10-CM | POA: Insufficient documentation

## 2024-03-26 DIAGNOSIS — E860 Dehydration: Secondary | ICD-10-CM | POA: Insufficient documentation

## 2024-03-26 DIAGNOSIS — N289 Disorder of kidney and ureter, unspecified: Secondary | ICD-10-CM | POA: Insufficient documentation

## 2024-03-26 DIAGNOSIS — Z96651 Presence of right artificial knee joint: Secondary | ICD-10-CM | POA: Insufficient documentation

## 2024-03-26 DIAGNOSIS — I5020 Unspecified systolic (congestive) heart failure: Secondary | ICD-10-CM | POA: Insufficient documentation

## 2024-03-26 DIAGNOSIS — E871 Hypo-osmolality and hyponatremia: Secondary | ICD-10-CM | POA: Insufficient documentation

## 2024-03-26 DIAGNOSIS — E119 Type 2 diabetes mellitus without complications: Secondary | ICD-10-CM | POA: Insufficient documentation

## 2024-03-26 DIAGNOSIS — I2510 Atherosclerotic heart disease of native coronary artery without angina pectoris: Secondary | ICD-10-CM | POA: Insufficient documentation

## 2024-03-26 DIAGNOSIS — Z7901 Long term (current) use of anticoagulants: Secondary | ICD-10-CM | POA: Insufficient documentation

## 2024-03-26 DIAGNOSIS — E039 Hypothyroidism, unspecified: Secondary | ICD-10-CM | POA: Insufficient documentation

## 2024-03-26 DIAGNOSIS — I110 Hypertensive heart disease with heart failure: Secondary | ICD-10-CM | POA: Insufficient documentation

## 2024-03-26 DIAGNOSIS — N179 Acute kidney failure, unspecified: Secondary | ICD-10-CM | POA: Diagnosis not present

## 2024-03-26 LAB — URINALYSIS, ROUTINE W REFLEX MICROSCOPIC
Bilirubin Urine: NEGATIVE
Glucose, UA: NEGATIVE mg/dL
Hgb urine dipstick: NEGATIVE
Ketones, ur: NEGATIVE mg/dL
Nitrite: NEGATIVE
Protein, ur: NEGATIVE mg/dL
RBC / HPF: 0 RBC/hpf (ref 0–5)
Specific Gravity, Urine: 1.01 (ref 1.005–1.030)
pH: 5 (ref 5.0–8.0)

## 2024-03-26 LAB — CBC
HCT: 39.1 % (ref 36.0–46.0)
Hemoglobin: 13.1 g/dL (ref 12.0–15.0)
MCH: 32.9 pg (ref 26.0–34.0)
MCHC: 33.5 g/dL (ref 30.0–36.0)
MCV: 98.2 fL (ref 80.0–100.0)
Platelets: 235 10*3/uL (ref 150–400)
RBC: 3.98 MIL/uL (ref 3.87–5.11)
RDW: 18.7 % — ABNORMAL HIGH (ref 11.5–15.5)
WBC: 5.8 10*3/uL (ref 4.0–10.5)
nRBC: 0 % (ref 0.0–0.2)

## 2024-03-26 LAB — BASIC METABOLIC PANEL WITH GFR
Anion gap: 16 — ABNORMAL HIGH (ref 5–15)
BUN: 55 mg/dL — ABNORMAL HIGH (ref 8–23)
CO2: 14 mmol/L — ABNORMAL LOW (ref 22–32)
Calcium: 10.2 mg/dL (ref 8.9–10.3)
Chloride: 94 mmol/L — ABNORMAL LOW (ref 98–111)
Creatinine, Ser: 2.72 mg/dL — ABNORMAL HIGH (ref 0.44–1.00)
GFR, Estimated: 18 mL/min — ABNORMAL LOW (ref >60)
Glucose, Bld: 169 mg/dL — ABNORMAL HIGH (ref 70–99)
Potassium: 5.1 mmol/L (ref 3.5–5.1)
Sodium: 124 mmol/L — ABNORMAL LOW (ref 135–145)

## 2024-03-26 MED ORDER — SODIUM CHLORIDE 0.9 % IV BOLUS
1000.0000 mL | Freq: Once | INTRAVENOUS | Status: AC
  Administered 2024-03-26: 1000 mL via INTRAVENOUS

## 2024-03-26 NOTE — ED Triage Notes (Signed)
 Patient comes in from home via pov with complaints of weakness and urine retention. Pt was hospitalized the 20-22nd with dehydration and poor kidney function. Pt is experiencing some of the same symptoms, and family is concerned the patient may be getting dehydrated again. Pt is alert and oriented x4, with no signs of acute distress at this time.

## 2024-03-26 NOTE — ED Provider Notes (Signed)
 [facility] Provider Note    Event Date/Time   First MD Initiated Contact with Patient 03/26/24 1745     (approximate)   History   Chief Complaint: Weakness   HPI  Tammy Arias is a 68 y.o. female with a history of atrial fibrillation on anticoagulation, CHF, diabetes who comes to the ED due to generalized weakness.  She was hospitalized recently due to dehydration and AKI, and after being discharged back home resume taking her diuretics.  Felt like her appetite has been good.  Over the last few days, she has been titrating down the diuretic, but also noticed a decreased in urine output, and over the last 2 days has become very fatigued with unsteadiness with standing.  Denies headache or motor weakness or paresthesias.        Past Medical History:  Diagnosis Date   AC (acromioclavicular) joint bone spurs, right    Anemia    Arthritis    Atrial fibrillation (HCC)    a.) CHA2DS2-VASc = 7 (age, sex, HFrEF, HTN, TIA x 2, T2DM). b.) rate/rhythm controlled on oral carvedilol ; chronically anticoagulated with warfarin.   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 b.) R/LHC 03/29/2017: small RCA with occluded SVG; no significant Dz. Chronically occluded LAD with patent SVG to D1/LAD. Insignificant Dz in LCx. LM normal.   Cortical cataract    DOE (dyspnea on exertion)    DOE (dyspnea on exertion)    GERD (gastroesophageal reflux disease)    HFrEF (heart failure with reduced ejection fraction) (HCC)    a.) TTE 10/27/2014: EF 40%; glob HK; mild BAE; triv AR, mild MR/PR, mod TR.  b.) TTE 03/15/2017: EF 30%; glob HK; mod BAE; mod pHTN (RVSP 54.8 mmHg); mild PR, mod MR; sev TR.  c.) R/LHC 03/29/2017: EF 30-35%. d.)TTE 10/25/2018: EF 35%; mod BAE; mod BVE; glob HK; triv PR, mod MR, sev TR; RVSP 57.7 mmHg; G2DD.    History of shingles 2004   Hyperlipidemia    Hypertension    Hypothyroidism    IBS (irritable bowel syndrome)    Ischemic cardiomyopathy    a.) TTE 10/27/2014: EF 40%. b.) TTE 03/15/2017: EF 30%. c.) R/LHC 04/01/2017: EF 30-35%. d.) TTE 10/25/2018: EF 35%   Lumbar scoliosis    Lumbar spinal stenosis    Migraines    Osteoporosis    Pulmonary HTN (HCC)    a.) TTE 03/15/2017: EF 30-35%; RVSP 54.8 mmHg. 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   Sleep apnea    T2DM (type 2 diabetes mellitus) (HCC)    TIA (transient ischemic attack) 05/29/2016   Vitamin B 12 deficiency     Current Outpatient Rx   Order #: [id number] Class: Historical Med   Order #: [id number] Class: Historical Med   Order #: [id number] Class: Normal   Order #: [id number] Class: Historical Med   Order #: [id number] Class: Historical Med   Order #: [id number] Class: Normal   Order #: [id number] Class: Historical Med   Order #: [id number] Class: Historical Med   Order #: [id number] Class: Historical Med   Order #: [id number] Class: Historical Med   Order #: [id number] Class: Normal   Order #: [id number] Class: Historical  Med   Order #: [id number] Class: Historical Med   Order #: [id number] Class: Historical Med   Order #: [id number] Class: Historical Med   Order #: [id number] Class: Normal    Past Surgical History:  Procedure Laterality Date   CARDIAC CATHETERIZATION     CATARACT EXTRACTION W/ INTRAOCULAR LENS IMPLANT Bilateral    Cataract Extraction with IOL   COLECTOMY  02/28/2024   Procedure: COLECTOMY, TOTAL;  Surgeon: Flynn Hylan, MD;  Location: [facility] ORS;  Service: General;;   COLONOSCOPY N/A 12/19/2018   Procedure: COLONOSCOPY;  Surgeon: Irby Mannan, MD;  Location: [facility] ENDOSCOPY;  Service: Endoscopy;  Laterality: N/A;   COLONOSCOPY WITH PROPOFOL  N/A 06/17/2020   Procedure: COLONOSCOPY WITH PROPOFOL ;  Surgeon:  Irby Mannan, MD;  Location: [facility] ENDOSCOPY;  Service: Endoscopy;  Laterality: N/A;   COLONOSCOPY WITH PROPOFOL  N/A 03/30/2023   Procedure: COLONOSCOPY WITH PROPOFOL ;  Surgeon: Quintin Buckle, DO;  Location: [facility] ENDOSCOPY;  Service: Endoscopy;  Laterality: N/A;   COR TRIATRIATUM REPAIR N/A 01/28/2004   CORONARY ARTERY BYPASS GRAFT N/A 01/28/2004   3v CABG   ESOPHAGOGASTRODUODENOSCOPY N/A 12/19/2018   Procedure: ESOPHAGOGASTRODUODENOSCOPY (EGD);  Surgeon: Irby Mannan, MD;  Location: [facility] ENDOSCOPY;  Service: Endoscopy;  Laterality: N/A;   ESOPHAGOGASTRODUODENOSCOPY (EGD) WITH PROPOFOL  N/A 06/17/2020   Procedure: ESOPHAGOGASTRODUODENOSCOPY (EGD) WITH PROPOFOL ;  Surgeon: Irby Mannan, MD;  Location: [facility] ENDOSCOPY;  Service: Endoscopy;  Laterality: N/A;   ESOPHAGOGASTRODUODENOSCOPY (EGD) WITH PROPOFOL  N/A 03/30/2023   Procedure: ESOPHAGOGASTRODUODENOSCOPY (EGD) WITH PROPOFOL ;  Surgeon: Quintin Buckle, DO;  Location: [facility] ENDOSCOPY;  Service: Endoscopy;  Laterality: N/A;   LAPAROTOMY N/A 02/28/2024   Procedure: LAPAROTOMY, EXPLORATORY;  Surgeon: Flynn Hylan, MD;  Location: [facility] ORS;  Service: General;  Laterality: N/A;   RIGHT/LEFT HEART CATH AND CORONARY ANGIOGRAPHY Bilateral 03/29/2017   Procedure: Right/Left Heart Cath and Coronary Angiography;  Surgeon: Ronney Cola, MD;  Location: [facility] INVASIVE CV LAB;  Service: Cardiovascular;  Laterality: Bilateral;   TOTAL KNEE ARTHROPLASTY Right 04/05/2022   Procedure: TOTAL KNEE ARTHROPLASTY;  Surgeon: Molli Angelucci, MD;  Location: [facility] ORS;  Service: Orthopedics;  Laterality: Right;   TUBAL LIGATION     VENTRAL HERNIA REPAIR N/A 04/21/2017   Procedure: HERNIA REPAIR VENTRAL ADULT;  Surgeon: Benancio Bracket, MD;  Location: [facility] ORS;  Service: General;  Laterality: N/A;    Physical Exam   Triage Vital Signs: ED Triage Vitals  Encounter Vitals Group     BP 03/26/24 1430 120/74     Systolic BP Percentile --       Diastolic BP Percentile --      Pulse Rate 03/26/24 1430 88     Resp 03/26/24 1430 19     Temp 03/26/24 1430 98.4 F (36.9 C)     Temp src --      SpO2 03/26/24 1430 98 %     Weight 03/26/24 1431 110 lb (49.9 kg)     Height 03/26/24 1431 5\' 1"  (1.549 m)     Head Circumference --      Peak Flow --      Pain Score 03/26/24 1431 0     Pain Loc --      Pain Education --      Exclude from Growth Chart --     Most recent vital signs: Vitals:   03/26/24 2000 03/26/24 2030  BP: (!) 109/53 110/61  Pulse: 74 77  Resp: 17 (!) 26  Temp:    SpO2: 100% 100%  General: Awake, no distress.  CV:  Good peripheral perfusion.  Regular rate rhythm Resp:  Normal effort.  Clear to auscultation bilaterally Abd:  No distention.  Soft nontender Other:  No lower extremity edema.  Dry oral mucosa.   ED Results / Procedures / Treatments   Labs (all labs ordered are listed, but only abnormal results are displayed) Labs Reviewed  BASIC METABOLIC PANEL WITH GFR - Abnormal; Notable for the following components:      Result Value   Sodium 124 (*)    Chloride 94 (*)    CO2 14 (*)    Glucose, Bld 169 (*)    BUN 55 (*)    Creatinine, Ser 2.72 (*)    GFR, Estimated 18 (*)    Anion gap 16 (*)    All other components within normal limits  CBC - Abnormal; Notable for the following components:   RDW 18.7 (*)    All other components within normal limits  URINALYSIS, ROUTINE W REFLEX MICROSCOPIC - Abnormal; Notable for the following components:   Color, Urine YELLOW (*)    APPearance CLEAR (*)    Leukocytes,Ua TRACE (*)    Bacteria, UA RARE (*)    All other components within normal limits  CBG MONITORING, ED     EKG Interpreted by me Normal sinus rhythm rate of 80.  Left axis, normal intervals.  Poor R wave progression.  Normal ST segments, inferior T wave inversions.   RADIOLOGY    PROCEDURES:  Procedures   MEDICATIONS ORDERED IN ED: Medications  sodium chloride  0.9 % bolus  1,000 mL (0 mLs Intravenous Stopped 03/26/24 2022)     IMPRESSION / MDM / ASSESSMENT AND PLAN / ED COURSE  I reviewed the triage vital signs and the nursing notes.  DDx: Dehydration, AKI, electrolyte derangement, anemia  Patient's presentation is most consistent with acute presentation with potential threat to life or bodily function.  Patient presents to the ED with generalized weakness in the setting of a 4 pound weight loss over the past week and diuretic use.  No signs of infection or bleeding complication from her anticoagulation.  Clinically she appears dehydrated.  Labs show AKI, hyponatremia.  Will give 1 L saline bolus and reassess.  Has follow-up with her doctors tomorrow and next week.  Clinical Course as of 03/26/24 2135  Tue Mar 26, 2024  2133 Patient is feeling well.  Vital signs remain normal.  Urinalysis unremarkable.  Think the lab abnormalities are due to overdiuresis and she is not symptomatic.  They will continue to monitor weight, and plan to take Lasix  only when weight is above her euvolemic weight of 114 pounds.  She will keep her follow-up appointments coming up.  Discussed hospitalization with them, but they are comfortable with outpatient management. [PS]    Clinical Course User Index [PS] Jacquie Maudlin, MD     FINAL CLINICAL IMPRESSION(S) / ED DIAGNOSES   Final diagnoses:  Dehydration  Hyponatremia  Acute renal insufficiency     Rx / DC Orders   ED Discharge Orders     None        Note:  This document was prepared using Dragon voice recognition software and may include unintentional dictation errors.   Jacquie Maudlin, MD 03/26/24 2135

## 2024-03-26 NOTE — ED Notes (Signed)
 This RN received report from Exie Holler RN and performed care handoff. This RN introduced self to pt. Call light in reach, bed wheels locked, side rails raised, pt updated on plan of care. Rounding completed. High fall risk precautions in place.

## 2024-03-28 ENCOUNTER — Inpatient Hospital Stay
Admission: EM | Admit: 2024-03-28 | Discharge: 2024-03-31 | DRG: 683 | Disposition: A | Discharge status: Home / Self Care | Attending: Internal Medicine | Admitting: Internal Medicine

## 2024-03-28 ENCOUNTER — Encounter: Payer: Self-pay | Admitting: Intensive Care

## 2024-03-28 ENCOUNTER — Emergency Department

## 2024-03-28 ENCOUNTER — Telehealth: Payer: Self-pay | Admitting: *Deleted

## 2024-03-28 ENCOUNTER — Other Ambulatory Visit: Payer: Self-pay

## 2024-03-28 DIAGNOSIS — I272 Pulmonary hypertension, unspecified: Secondary | ICD-10-CM | POA: Diagnosis present

## 2024-03-28 DIAGNOSIS — Z888 Allergy status to other drugs, medicaments and biological substances status: Secondary | ICD-10-CM

## 2024-03-28 DIAGNOSIS — N179 Acute kidney failure, unspecified: Secondary | ICD-10-CM | POA: Diagnosis not present

## 2024-03-28 DIAGNOSIS — I255 Ischemic cardiomyopathy: Secondary | ICD-10-CM | POA: Diagnosis present

## 2024-03-28 DIAGNOSIS — E860 Dehydration: Secondary | ICD-10-CM | POA: Diagnosis present

## 2024-03-28 DIAGNOSIS — T502X5A Adverse effect of carbonic-anhydrase inhibitors, benzothiadiazides and other diuretics, initial encounter: Secondary | ICD-10-CM | POA: Diagnosis present

## 2024-03-28 DIAGNOSIS — N1832 Chronic kidney disease, stage 3b: Secondary | ICD-10-CM | POA: Diagnosis present

## 2024-03-28 DIAGNOSIS — Z8619 Personal history of other infectious and parasitic diseases: Secondary | ICD-10-CM

## 2024-03-28 DIAGNOSIS — K8020 Calculus of gallbladder without cholecystitis without obstruction: Secondary | ICD-10-CM | POA: Diagnosis present

## 2024-03-28 DIAGNOSIS — Z8249 Family history of ischemic heart disease and other diseases of the circulatory system: Secondary | ICD-10-CM

## 2024-03-28 DIAGNOSIS — Z7984 Long term (current) use of oral hypoglycemic drugs: Secondary | ICD-10-CM

## 2024-03-28 DIAGNOSIS — N189 Chronic kidney disease, unspecified: Secondary | ICD-10-CM

## 2024-03-28 DIAGNOSIS — Z9049 Acquired absence of other specified parts of digestive tract: Secondary | ICD-10-CM

## 2024-03-28 DIAGNOSIS — Z951 Presence of aortocoronary bypass graft: Secondary | ICD-10-CM

## 2024-03-28 DIAGNOSIS — I482 Chronic atrial fibrillation, unspecified: Secondary | ICD-10-CM | POA: Diagnosis present

## 2024-03-28 DIAGNOSIS — R748 Abnormal levels of other serum enzymes: Secondary | ICD-10-CM | POA: Diagnosis not present

## 2024-03-28 DIAGNOSIS — I5022 Chronic systolic (congestive) heart failure: Secondary | ICD-10-CM | POA: Diagnosis present

## 2024-03-28 DIAGNOSIS — Y92009 Unspecified place in unspecified non-institutional (private) residence as the place of occurrence of the external cause: Secondary | ICD-10-CM

## 2024-03-28 DIAGNOSIS — E039 Hypothyroidism, unspecified: Secondary | ICD-10-CM | POA: Diagnosis present

## 2024-03-28 DIAGNOSIS — K219 Gastro-esophageal reflux disease without esophagitis: Secondary | ICD-10-CM | POA: Diagnosis present

## 2024-03-28 DIAGNOSIS — I130 Hypertensive heart and chronic kidney disease with heart failure and stage 1 through stage 4 chronic kidney disease, or unspecified chronic kidney disease: Secondary | ICD-10-CM | POA: Diagnosis present

## 2024-03-28 DIAGNOSIS — Z96651 Presence of right artificial knee joint: Secondary | ICD-10-CM | POA: Diagnosis present

## 2024-03-28 DIAGNOSIS — E872 Acidosis, unspecified: Secondary | ICD-10-CM | POA: Diagnosis present

## 2024-03-28 DIAGNOSIS — Z79890 Hormone replacement therapy (postmenopausal): Secondary | ICD-10-CM

## 2024-03-28 DIAGNOSIS — K7460 Unspecified cirrhosis of liver: Secondary | ICD-10-CM | POA: Diagnosis present

## 2024-03-28 DIAGNOSIS — Z932 Ileostomy status: Secondary | ICD-10-CM

## 2024-03-28 DIAGNOSIS — E861 Hypovolemia: Secondary | ICD-10-CM | POA: Diagnosis present

## 2024-03-28 DIAGNOSIS — Z833 Family history of diabetes mellitus: Secondary | ICD-10-CM

## 2024-03-28 DIAGNOSIS — E119 Type 2 diabetes mellitus without complications: Secondary | ICD-10-CM

## 2024-03-28 DIAGNOSIS — Z87891 Personal history of nicotine dependence: Secondary | ICD-10-CM

## 2024-03-28 DIAGNOSIS — I2510 Atherosclerotic heart disease of native coronary artery without angina pectoris: Secondary | ICD-10-CM | POA: Diagnosis present

## 2024-03-28 DIAGNOSIS — E785 Hyperlipidemia, unspecified: Secondary | ICD-10-CM | POA: Diagnosis present

## 2024-03-28 DIAGNOSIS — Z961 Presence of intraocular lens: Secondary | ICD-10-CM | POA: Diagnosis present

## 2024-03-28 DIAGNOSIS — K766 Portal hypertension: Secondary | ICD-10-CM | POA: Diagnosis present

## 2024-03-28 DIAGNOSIS — R54 Age-related physical debility: Secondary | ICD-10-CM | POA: Diagnosis present

## 2024-03-28 DIAGNOSIS — D7589 Other specified diseases of blood and blood-forming organs: Secondary | ICD-10-CM | POA: Diagnosis present

## 2024-03-28 DIAGNOSIS — Z7901 Long term (current) use of anticoagulants: Secondary | ICD-10-CM

## 2024-03-28 DIAGNOSIS — E871 Hypo-osmolality and hyponatremia: Secondary | ICD-10-CM | POA: Diagnosis present

## 2024-03-28 DIAGNOSIS — Z79899 Other long term (current) drug therapy: Secondary | ICD-10-CM

## 2024-03-28 DIAGNOSIS — Z9842 Cataract extraction status, left eye: Secondary | ICD-10-CM

## 2024-03-28 DIAGNOSIS — E1122 Type 2 diabetes mellitus with diabetic chronic kidney disease: Secondary | ICD-10-CM | POA: Diagnosis present

## 2024-03-28 DIAGNOSIS — M109 Gout, unspecified: Secondary | ICD-10-CM | POA: Diagnosis present

## 2024-03-28 DIAGNOSIS — Z9841 Cataract extraction status, right eye: Secondary | ICD-10-CM

## 2024-03-28 DIAGNOSIS — Z8673 Personal history of transient ischemic attack (TIA), and cerebral infarction without residual deficits: Secondary | ICD-10-CM

## 2024-03-28 DIAGNOSIS — Z803 Family history of malignant neoplasm of breast: Secondary | ICD-10-CM

## 2024-03-28 DIAGNOSIS — I081 Rheumatic disorders of both mitral and tricuspid valves: Secondary | ICD-10-CM | POA: Diagnosis present

## 2024-03-28 DIAGNOSIS — I10 Essential (primary) hypertension: Secondary | ICD-10-CM | POA: Diagnosis present

## 2024-03-28 LAB — URINALYSIS, COMPLETE (UACMP) WITH MICROSCOPIC
Bacteria, UA: NONE SEEN
Bilirubin Urine: NEGATIVE
Glucose, UA: 50 mg/dL — AB
Hgb urine dipstick: NEGATIVE
Ketones, ur: NEGATIVE mg/dL
Leukocytes,Ua: NEGATIVE
Nitrite: NEGATIVE
Protein, ur: NEGATIVE mg/dL
Specific Gravity, Urine: 1.012 (ref 1.005–1.030)
pH: 5 (ref 5.0–8.0)

## 2024-03-28 LAB — BASIC METABOLIC PANEL WITH GFR
Anion gap: 11 (ref 5–15)
BUN: 60 mg/dL — ABNORMAL HIGH (ref 8–23)
CO2: 13 mmol/L — ABNORMAL LOW (ref 22–32)
Calcium: 9 mg/dL (ref 8.9–10.3)
Chloride: 101 mmol/L (ref 98–111)
Creatinine, Ser: 2.54 mg/dL — ABNORMAL HIGH (ref 0.44–1.00)
GFR, Estimated: 20 mL/min — ABNORMAL LOW (ref >60)
Glucose, Bld: 120 mg/dL — ABNORMAL HIGH (ref 70–99)
Potassium: 4.9 mmol/L (ref 3.5–5.1)
Sodium: 125 mmol/L — ABNORMAL LOW (ref 135–145)

## 2024-03-28 LAB — CBC WITH DIFFERENTIAL/PLATELET
Abs Immature Granulocytes: 0.04 10*3/uL (ref 0.00–0.07)
Basophils Absolute: 0 10*3/uL (ref 0.0–0.1)
Basophils Relative: 0 %
Eosinophils Absolute: 0.1 10*3/uL (ref 0.0–0.5)
Eosinophils Relative: 2 %
HCT: 40.4 % (ref 36.0–46.0)
Hemoglobin: 13.2 g/dL (ref 12.0–15.0)
Immature Granulocytes: 1 %
Lymphocytes Relative: 5 %
Lymphs Abs: 0.4 10*3/uL — ABNORMAL LOW (ref 0.7–4.0)
MCH: 32.9 pg (ref 26.0–34.0)
MCHC: 32.7 g/dL (ref 30.0–36.0)
MCV: 100.7 fL — ABNORMAL HIGH (ref 80.0–100.0)
Monocytes Absolute: 0.6 10*3/uL (ref 0.1–1.0)
Monocytes Relative: 8 %
Neutro Abs: 5.6 10*3/uL (ref 1.7–7.7)
Neutrophils Relative %: 84 %
Platelets: 166 10*3/uL (ref 150–400)
RBC: 4.01 MIL/uL (ref 3.87–5.11)
RDW: 18.5 % — ABNORMAL HIGH (ref 11.5–15.5)
WBC: 6.7 10*3/uL (ref 4.0–10.5)
nRBC: 0 % (ref 0.0–0.2)

## 2024-03-28 LAB — COMPREHENSIVE METABOLIC PANEL WITH GFR
ALT: 45 U/L — ABNORMAL HIGH (ref 0–44)
AST: 51 U/L — ABNORMAL HIGH (ref 15–41)
Albumin: 5 g/dL (ref 3.5–5.0)
Alkaline Phosphatase: 283 U/L — ABNORMAL HIGH (ref 38–126)
Anion gap: 14 (ref 5–15)
BUN: 62 mg/dL — ABNORMAL HIGH (ref 8–23)
CO2: 11 mmol/L — ABNORMAL LOW (ref 22–32)
Calcium: 10 mg/dL (ref 8.9–10.3)
Chloride: 99 mmol/L (ref 98–111)
Creatinine, Ser: 2.55 mg/dL — ABNORMAL HIGH (ref 0.44–1.00)
GFR, Estimated: 20 mL/min — ABNORMAL LOW (ref >60)
Glucose, Bld: 134 mg/dL — ABNORMAL HIGH (ref 70–99)
Potassium: 4.9 mmol/L (ref 3.5–5.1)
Sodium: 124 mmol/L — ABNORMAL LOW (ref 135–145)
Total Bilirubin: 0.8 mg/dL (ref 0.0–1.2)
Total Protein: 9 g/dL — ABNORMAL HIGH (ref 6.5–8.1)

## 2024-03-28 LAB — LIPASE, BLOOD: Lipase: 301 U/L — ABNORMAL HIGH (ref 11–51)

## 2024-03-28 MED ORDER — APIXABAN 2.5 MG PO TABS
2.5000 mg | ORAL_TABLET | Freq: Two times a day (BID) | ORAL | Status: DC
  Administered 2024-03-29 – 2024-03-31 (×5): 2.5 mg via ORAL
  Filled 2024-03-28 (×7): qty 1

## 2024-03-28 MED ORDER — COLCHICINE 0.6 MG PO TABS: 0.6000 mg | ORAL_TABLET | ORAL | Status: DC | PRN

## 2024-03-28 MED ORDER — MAGNESIUM HYDROXIDE 400 MG/5ML PO SUSP: 30.0000 mL | Freq: Every day | ORAL | Status: DC | PRN

## 2024-03-28 MED ORDER — SUCRALFATE 1 G PO TABS
1.0000 g | ORAL_TABLET | Freq: Three times a day (TID) | ORAL | Status: DC
Start: 2024-03-29 — End: 2024-03-29

## 2024-03-28 MED ORDER — SPIRONOLACTONE 25 MG PO TABS: 25.0000 mg | ORAL_TABLET | Freq: Every day | ORAL | Status: DC

## 2024-03-28 MED ORDER — ALBUTEROL SULFATE HFA 108 (90 BASE) MCG/ACT IN AERS: 2.0000 | INHALATION_SPRAY | Freq: Four times a day (QID) | RESPIRATORY_TRACT | Status: DC | PRN

## 2024-03-28 MED ORDER — ONDANSETRON HCL 4 MG/2ML IJ SOLN
4.0000 mg | Freq: Four times a day (QID) | INTRAMUSCULAR | Status: DC | PRN
Start: 2024-03-28 — End: 2024-03-31

## 2024-03-28 MED ORDER — SODIUM CHLORIDE 0.9 % IV SOLN: INTRAVENOUS | Status: AC

## 2024-03-28 MED ORDER — ROSUVASTATIN CALCIUM 10 MG PO TABS
5.0000 mg | ORAL_TABLET | Freq: Every day | ORAL | Status: DC
  Administered 2024-03-29 – 2024-03-31 (×3): 5 mg via ORAL
  Filled 2024-03-28 (×4): qty 1

## 2024-03-28 MED ORDER — VITAMIN C 500 MG PO TABS
500.0000 mg | ORAL_TABLET | Freq: Every day | ORAL | Status: DC
  Administered 2024-03-29 – 2024-03-31 (×3): 500 mg via ORAL
  Filled 2024-03-28 (×3): qty 1

## 2024-03-28 MED ORDER — ONDANSETRON HCL 4 MG/2ML IJ SOLN
4.0000 mg | Freq: Once | INTRAMUSCULAR | Status: AC
  Administered 2024-03-28: 4 mg via INTRAVENOUS
  Filled 2024-03-28: qty 2

## 2024-03-28 MED ORDER — SODIUM CHLORIDE 0.9 % IV BOLUS
1000.0000 mL | Freq: Once | INTRAVENOUS | Status: AC
  Administered 2024-03-28: 1000 mL via INTRAVENOUS

## 2024-03-28 MED ORDER — ACETAMINOPHEN 650 MG RE SUPP: 650.0000 mg | Freq: Four times a day (QID) | RECTAL | Status: DC | PRN

## 2024-03-28 MED ORDER — MIDODRINE HCL 5 MG PO TABS
10.0000 mg | ORAL_TABLET | Freq: Three times a day (TID) | ORAL | Status: DC
  Administered 2024-03-29 – 2024-03-31 (×8): 10 mg via ORAL
  Filled 2024-03-28 (×8): qty 2

## 2024-03-28 MED ORDER — LATANOPROST 0.005 % OP SOLN
1.0000 [drp] | Freq: Every day | OPHTHALMIC | Status: DC
  Administered 2024-03-29 – 2024-03-30 (×2): 1 [drp] via OPHTHALMIC
  Filled 2024-03-28 (×2): qty 2.5

## 2024-03-28 MED ORDER — ALLOPURINOL 100 MG PO TABS
300.0000 mg | ORAL_TABLET | Freq: Every day | ORAL | Status: DC
  Administered 2024-03-29 – 2024-03-31 (×3): 300 mg via ORAL
  Filled 2024-03-28 (×2): qty 3
  Filled 2024-03-28 (×2): qty 1

## 2024-03-28 MED ORDER — TORSEMIDE 20 MG PO TABS
20.0000 mg | ORAL_TABLET | Freq: Every day | ORAL | Status: DC
Start: 2024-03-29 — End: 2024-03-29

## 2024-03-28 MED ORDER — EMPAGLIFLOZIN 10 MG PO TABS
10.0000 mg | ORAL_TABLET | Freq: Every day | ORAL | Status: DC
  Administered 2024-03-30 – 2024-03-31 (×2): 10 mg via ORAL
  Filled 2024-03-28 (×2): qty 1

## 2024-03-28 MED ORDER — ONDANSETRON HCL 4 MG PO TABS: 4.0000 mg | ORAL_TABLET | Freq: Four times a day (QID) | ORAL | Status: DC | PRN

## 2024-03-28 MED ORDER — ALBUTEROL SULFATE (2.5 MG/3ML) 0.083% IN NEBU: 2.5000 mg | INHALATION_SOLUTION | Freq: Four times a day (QID) | RESPIRATORY_TRACT | Status: DC | PRN

## 2024-03-28 MED ORDER — ACETAMINOPHEN 325 MG PO TABS
650.0000 mg | ORAL_TABLET | Freq: Four times a day (QID) | ORAL | Status: DC | PRN
  Administered 2024-03-29 – 2024-03-30 (×2): 650 mg via ORAL
  Filled 2024-03-28 (×2): qty 2

## 2024-03-28 MED ORDER — PANTOPRAZOLE SODIUM 40 MG PO TBEC
40.0000 mg | DELAYED_RELEASE_TABLET | Freq: Every day | ORAL | Status: DC
  Administered 2024-03-29 – 2024-03-31 (×3): 40 mg via ORAL
  Filled 2024-03-28 (×3): qty 1

## 2024-03-28 MED ORDER — TRAZODONE HCL 50 MG PO TABS: 25.0000 mg | ORAL_TABLET | Freq: Every evening | ORAL | Status: DC | PRN

## 2024-03-28 MED ORDER — LEVOTHYROXINE SODIUM 50 MCG PO TABS
50.0000 ug | ORAL_TABLET | Freq: Every day | ORAL | Status: DC
Start: 2024-03-29 — End: 2024-03-31
  Administered 2024-03-29 – 2024-03-31 (×3): 50 ug via ORAL
  Filled 2024-03-28 (×3): qty 1

## 2024-03-28 MED ORDER — POLYSACCHARIDE IRON COMPLEX 150 MG PO CAPS
150.0000 mg | ORAL_CAPSULE | Freq: Every day | ORAL | Status: DC
  Administered 2024-03-29 – 2024-03-31 (×3): 150 mg via ORAL
  Filled 2024-03-28 (×4): qty 1

## 2024-03-28 NOTE — ED Provider Notes (Signed)
 [facility] Provider Note    Event Date/Time   First MD Initiated Contact with Patient 03/28/24 1719     (approximate)   History   Dehydration  Patient had all of colon removed 02/27/24. C/o dehydration intermittently since surgery. Today c/o emesis, fullness and shaky.   Daughter reports once she gets dehydrated, her kidneys began to shut down   Patient has ileostomy on right side    HPI Tammy Arias is a 68 y.o. female PMH A-fib on Eliquis , CHF, CAD with prior CABG, diabetes, recent admission for pancolitis and C. difficile colitis (s/p colectomy and ileostomy) presents for evaluation of emesis and concern for dehydration - Multiple prior visits for dehydration, see summary of recent visit below - Brought in by daughter today due to concern for dehydration.  Patient had an episode of emesis about 1 hour after eating and daughter became concerned about inability to tolerate p.o. in the setting of recent dehydration.  Ostomy has been having good output. -Decreased urination, no urinary symptoms however - No abdominal pain, chest pain, shortness of breath.  Per chart review, last seen in our emergency department 2 days ago due to concern for dehydration in the setting of restarting her diuretics after hospitalization for dehydration and AKI.  Labs with AKI, hyponatremia.  Responded well to single liter normal saline.  To hold Lasix  and only restart if euvolemic to 114 pounds.     Physical Exam   Triage Vital Signs: ED Triage Vitals  Encounter Vitals Group     BP 03/28/24 1536 118/72     Systolic BP Percentile --      Diastolic BP Percentile --      Pulse Rate 03/28/24 1536 85     Resp 03/28/24 1536 17     Temp 03/28/24 1536 98.1 F (36.7 C)     Temp Source 03/28/24 1536 Oral     SpO2 03/28/24 1536 99 %     Weight 03/28/24 1541 109 lb (49.4 kg)     Height 03/28/24 1541 5\' 1"  (1.549 m)     Head Circumference --      Peak Flow --      Pain Score  03/28/24 1541 0     Pain Loc --      Pain Education --      Exclude from Growth Chart --     Most recent vital signs: Vitals:   03/28/24 2045 03/28/24 2245  BP: 124/76   Pulse: 87 86  Resp: 13 15  Temp: 97.9 F (36.6 C)   SpO2: 100% 99%     General: Awake, no distress. Well-appearing.  CV:  Good peripheral perfusion. RRR, RP 2+ Resp:  Normal effort. CTAB Abd:  No distention. Nontender to deep palpation throughout. Ostomy w/ normal outptut.     ED Results / Procedures / Treatments   Labs (all labs ordered are listed, but only abnormal results are displayed) Labs Reviewed  CBC WITH DIFFERENTIAL/PLATELET - Abnormal; Notable for the following components:      Result Value   MCV 100.7 (*)    RDW 18.5 (*)    Lymphs Abs 0.4 (*)    All other components within normal limits  COMPREHENSIVE METABOLIC PANEL WITH GFR - Abnormal; Notable for the following components:   Sodium 124 (*)    CO2 11 (*)    Glucose, Bld 134 (*)    BUN 62 (*)    Creatinine, Ser 2.55 (*)  Total Protein 9.0 (*)    AST 51 (*)    ALT 45 (*)    Alkaline Phosphatase 283 (*)    GFR, Estimated 20 (*)    All other components within normal limits  LIPASE, BLOOD - Abnormal; Notable for the following components:   Lipase 301 (*)    All other components within normal limits  BASIC METABOLIC PANEL WITH GFR - Abnormal; Notable for the following components:   Sodium 125 (*)    CO2 13 (*)    Glucose, Bld 120 (*)    BUN 60 (*)    Creatinine, Ser 2.54 (*)    GFR, Estimated 20 (*)    All other components within normal limits  URINALYSIS, COMPLETE (UACMP) WITH MICROSCOPIC - Abnormal; Notable for the following components:   Color, Urine YELLOW (*)    APPearance HAZY (*)    Glucose, UA 50 (*)    All other components within normal limits  COMPREHENSIVE METABOLIC PANEL WITH GFR  CBC  LIPASE, BLOOD     EKG  See ED course below   RADIOLOGY See ED course below    PROCEDURES:  Critical Care  performed: No  Procedures   MEDICATIONS ORDERED IN ED: Medications  allopurinol  (ZYLOPRIM ) tablet 300 mg (has no administration in time range)  colchicine  tablet 0.6-1.8 mg (has no administration in time range)  midodrine  (PROAMATINE ) tablet 10 mg (has no administration in time range)  rosuvastatin  (CRESTOR ) tablet 5 mg (has no administration in time range)  spironolactone  (ALDACTONE ) tablet 25 mg (has no administration in time range)  torsemide  (DEMADEX ) tablet 20 mg (has no administration in time range)  empagliflozin  (JARDIANCE ) tablet 10 mg (has no administration in time range)  levothyroxine  (SYNTHROID ) tablet 50 mcg (has no administration in time range)  pantoprazole  (PROTONIX ) EC tablet 40 mg (has no administration in time range)  sucralfate  (CARAFATE ) tablet 1 g (has no administration in time range)  apixaban  (ELIQUIS ) tablet 2.5 mg (has no administration in time range)  iron  polysaccharides (NIFEREX) capsule 150 mg (has no administration in time range)  ascorbic acid  (VITAMIN C ) tablet 500 mg (has no administration in time range)  latanoprost  (XALATAN ) 0.005 % ophthalmic solution 1 drop (has no administration in time range)  0.9 %  sodium chloride  infusion ( Intravenous New Bag/Given 03/28/24 2254)  acetaminophen  (TYLENOL ) tablet 650 mg (has no administration in time range)    Or  acetaminophen  (TYLENOL ) suppository 650 mg (has no administration in time range)  traZODone  (DESYREL ) tablet 25 mg (has no administration in time range)  magnesium  hydroxide (MILK OF MAGNESIA) suspension 30 mL (has no administration in time range)  ondansetron  (ZOFRAN ) tablet 4 mg (has no administration in time range)    Or  ondansetron  (ZOFRAN ) injection 4 mg (has no administration in time range)  albuterol  (PROVENTIL ) (2.5 MG/3ML) 0.083% nebulizer solution 2.5 mg (has no administration in time range)  sodium chloride  0.9 % bolus 1,000 mL (0 mLs Intravenous Stopped 03/28/24 1927)  ondansetron  (ZOFRAN )  injection 4 mg (4 mg Intravenous Given 03/28/24 1818)     IMPRESSION / MDM / ASSESSMENT AND PLAN / ED COURSE  I reviewed the triage vital signs and the nursing notes.                              DDX/MDM/AP: Differential diagnosis includes, but is not limited to, ongoing dehydration in the setting of recent overdiuresis and now reduced p.o. intake.  Do not clinically suspect underlying bowel obstruction given good ostomy output, no abdominal complaints other than 1 episode of emesis and very reassuring abdominal exam.  Doubt underlying urinary obstruction.  Plan: - Labs - IV fluid, Zofran  -Bedside ultrasound to ensure no urinary tract obstruction --no hydronephrosis, PVR less than 200 - Indication for repeat imaging at this time  Patient's presentation is most consistent with acute presentation with potential threat to life or bodily function.  The patient is on the cardiac monitor to evaluate for evidence of arrhythmia and/or significant heart rate changes.  ED course below.  Workup with notable AKI on CKD, appears baseline creatinine is around 1.5.  Also notable hyponatremia, similar to about 2 days ago, now improved.  Patient received 1 L IV fluid, repeat BMP essentially unchanged.  No evidence of underlying urologic obstruction.  Discussed with hospitalist given patient recently failed outpatient management and will likely require further hydration with monitoring of hyponatremia.  Admitted to hospitalist service.  Clinical Course as of 03/29/24 0054  Thu Mar 28, 2024  1852 Ecg = sinus tachycardia, rate 113, no appreciable ST elevation or depression that is have some T wave inversions in leads II, aVF and V3-V6.  Asymptomatic. [MM]  1939 Right upper quadrant ultrasound interpreted by myself, cholelithiasis but not consistent with cholecystitis  Radiology report reviewed  IMPRESSION: 1. Cholelithiasis. 2. Hepatic cirrhosis.   [MM]  2009 Repeat BMP with minimally improved  hyponatremia and unchanged creatinine [MM]  2111 Bedside ultrasound with no hydronephrosis.  PVR less than 200.  Urine sample collected.  Shared decision making, patient and family are interested in admission which I believe is reasonable.  Hospitalist consult order placed. [MM]    Clinical Course User Index [MM] Collis Deaner, MD     FINAL CLINICAL IMPRESSION(S) / ED DIAGNOSES   Final diagnoses:  AKI (acute kidney injury) (HCC)  Hyponatremia  Dehydration     Rx / DC Orders   ED Discharge Orders     None        Note:  This document was prepared using Dragon voice recognition software and may include unintentional dictation errors.   Collis Deaner, MD 03/29/24 [phone]

## 2024-03-28 NOTE — ED Notes (Signed)
 Called pt on number listed in chart, they are in the restroom and will room her when she comes out

## 2024-03-28 NOTE — ED Provider Triage Note (Signed)
 Emergency Medicine Provider Triage Evaluation Note  Tammy Arias , a 68 y.o. female  was evaluated in triage.  Pt complains of concerns for dehydration, patient was recently hospitalized for AKI and hyponatremia. Was seen on 4/29 and given IV fluids. She has been drinking water  but is not making a lot of urine.  Review of Systems  Positive: weakness Negative: fever  Physical Exam  BP 118/72 (BP Location: Left Arm)   Pulse 85   Temp 98.1 F (36.7 C) (Oral)   Resp 17   Ht 5\' 1"  (1.549 m)   Wt 49.4 kg   SpO2 99%   BMI 20.60 kg/m  Gen:   Awake, no distress   Resp:  Normal effort  MSK:   Moves extremities without difficulty  Other:    Medical Decision Making  Medically screening exam initiated at 3:44 PM.  Appropriate orders placed.  Loletha Ripper Fraticelli was informed that the remainder of the evaluation will be completed by another provider, this initial triage assessment does not replace that evaluation, and the importance of remaining in the ED until their evaluation is complete.     Phyliss Breen, PA-C 03/28/24 1546

## 2024-03-28 NOTE — H&P (Addendum)
 [facility]   PATIENT NAME: Tammy Arias    MR#:  [id number]  DATE OF BIRTH:  1956-07-11  DATE OF ADMISSION:  03/28/2024  PRIMARY CARE PHYSICIAN: Lyle San, MD   Patient is coming from: Home  REQUESTING/REFERRING PHYSICIAN: Bunny Caroli, MD  CHIEF COMPLAINT:   Chief Complaint  Patient presents with   Dehydration    HISTORY OF PRESENT ILLNESS:  Tammy Arias is a 68 y.o. female with medical history significant for CHF, osteoarthritis, atrial fibrillation, coronary artery disease, HFrEF, GERD, hypertension and hypothyroidism as well as type 2 diabetes mellitus, who comes to the emergency room with acute onset of diminished appetite and feeling dehydrated as well as vomiting today.  She stated that she lost appetite last couple of days and has been trying to force herself to eat.  She feels weak and shaky.  She lost 2 to 3 pounds.  She has been having diminished urine output.  Her daughter took her out of diuretic therapy.  No fever or chills.  No chest pain or palpitations.  No cough or wheezing or dyspnea.  No dysuria, oliguria or hematuria or flank pain.  She denied increased output from her ileostomy.  ED Course: When the patient came to the ER, vital signs were within normal.  Heart rate was later 109.  Labs revealed hyponatremia 120 4 repeat levels 125 CO2 of 11 and later 13, blood glucose 134 later 120 and BUN 62 with creatinine 2.55 mL 60/2.54 after hydration.  LFTs revealed increased alk phos of 283 with lipase of 301 and AST 51 ALT 45 with total protein of 9 albumin  and 5 with total bili of 0.8.  CBC showed macrocytosis.  UA showed 6010 WBCs with no bacteria and negative leukocytes and 50 glucose. EKG as reviewed by me : EKG showed sinus tachycardia with rate of 113 with poor R wave progression. Imaging: Right upper quadrant ultrasound showed cholelithiasis and hepatic cirrhosis.  The patient was given 1 L bolus of IV normal saline and 4 mg of IV Zofran .  She will  be admitted to a medical telemetry observed bed for further evaluation and management PAST MEDICAL HISTORY:   Past Medical History:  Diagnosis Date   AC (acromioclavicular) joint bone spurs, right    Anemia    Arthritis    Atrial fibrillation (HCC)    a.) CHA2DS2-VASc = 7 (age, sex, HFrEF, HTN, TIA x 2, T2DM). b.) rate/rhythm controlled on oral carvedilol ; chronically anticoagulated with warfarin.   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 b.) R/LHC 03/29/2017: small RCA with occluded SVG; no significant Dz. Chronically occluded LAD with patent SVG to D1/LAD. Insignificant Dz in LCx. LM normal.   Cortical cataract    DOE (dyspnea on exertion)    DOE (dyspnea on exertion)    GERD (gastroesophageal reflux disease)    HFrEF (heart failure with reduced ejection fraction) (HCC)    a.) TTE 10/27/2014: EF 40%; glob HK; mild BAE; triv AR, mild MR/PR, mod TR.  b.) TTE 03/15/2017: EF 30%; glob HK; mod BAE; mod pHTN (RVSP 54.8 mmHg); mild PR, mod MR; sev TR.  c.) R/LHC 03/29/2017: EF 30-35%. d.)TTE 10/25/2018: EF 35%; mod BAE; mod BVE; glob HK; triv PR, mod MR, sev TR; RVSP 57.7 mmHg;  G2DD.   History of shingles 2004   Hyperlipidemia    Hypertension    Hypothyroidism    IBS (irritable bowel syndrome)    Ischemic cardiomyopathy    a.) TTE 10/27/2014: EF 40%. b.) TTE 03/15/2017: EF 30%. c.) R/LHC 04/01/2017: EF 30-35%. d.) TTE 10/25/2018: EF 35%   Lumbar scoliosis    Lumbar spinal stenosis    Migraines    Osteoporosis    Pulmonary HTN (HCC)    a.) TTE 03/15/2017: EF 30-35%; RVSP 54.8 mmHg. 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   Sleep apnea    T2DM (type 2 diabetes mellitus) (HCC)    TIA (transient ischemic attack) 05/29/2016    Vitamin B 12 deficiency     PAST SURGICAL HISTORY:   Past Surgical History:  Procedure Laterality Date   CARDIAC CATHETERIZATION     CATARACT EXTRACTION W/ INTRAOCULAR LENS IMPLANT Bilateral    Cataract Extraction with IOL   COLECTOMY  02/28/2024   Procedure: COLECTOMY, TOTAL;  Surgeon: Flynn Hylan, MD;  Location: [facility] ORS;  Service: General;;   COLONOSCOPY N/A 12/19/2018   Procedure: COLONOSCOPY;  Surgeon: Irby Mannan, MD;  Location: [facility] ENDOSCOPY;  Service: Endoscopy;  Laterality: N/A;   COLONOSCOPY WITH PROPOFOL  N/A 06/17/2020   Procedure: COLONOSCOPY WITH PROPOFOL ;  Surgeon: Irby Mannan, MD;  Location: [facility] ENDOSCOPY;  Service: Endoscopy;  Laterality: N/A;   COLONOSCOPY WITH PROPOFOL  N/A 03/30/2023   Procedure: COLONOSCOPY WITH PROPOFOL ;  Surgeon: Quintin Buckle, DO;  Location: [facility] ENDOSCOPY;  Service: Endoscopy;  Laterality: N/A;   COR TRIATRIATUM REPAIR N/A 01/28/2004   CORONARY ARTERY BYPASS GRAFT N/A 01/28/2004   3v CABG   ESOPHAGOGASTRODUODENOSCOPY N/A 12/19/2018   Procedure: ESOPHAGOGASTRODUODENOSCOPY (EGD);  Surgeon: Irby Mannan, MD;  Location: [facility] ENDOSCOPY;  Service: Endoscopy;  Laterality: N/A;   ESOPHAGOGASTRODUODENOSCOPY (EGD) WITH PROPOFOL  N/A 06/17/2020   Procedure: ESOPHAGOGASTRODUODENOSCOPY (EGD) WITH PROPOFOL ;  Surgeon: Irby Mannan, MD;  Location: [facility] ENDOSCOPY;  Service: Endoscopy;  Laterality: N/A;   ESOPHAGOGASTRODUODENOSCOPY (EGD) WITH PROPOFOL  N/A 03/30/2023   Procedure: ESOPHAGOGASTRODUODENOSCOPY (EGD) WITH PROPOFOL ;  Surgeon: Quintin Buckle, DO;  Location[facility] ENDOSCOPY;  Service: Endoscopy;  Laterality: N/A;   LAPAROTOMY N/A 02/28/2024   Procedure: LAPAROTOMY, EXPLORATORY;  Surgeon: Flynn Hylan, MD;  Location: [facility] ORS;  Service: General;  Laterality: N/A;   RIGHT/LEFT HEART CATH AND CORONARY ANGIOGRAPHY Bilateral 03/29/2017   Procedure: Right/Left Heart Cath and Coronary Angiography;  Surgeon: Ronney Cola, MD;  Location: [facility] INVASIVE CV LAB;  Service: Cardiovascular;  Laterality: Bilateral;   TOTAL KNEE ARTHROPLASTY Right 04/05/2022   Procedure: TOTAL KNEE ARTHROPLASTY;  Surgeon: Molli Angelucci, MD;  Location: [facility] ORS;  Service: Orthopedics;  Laterality: Right;   TUBAL LIGATION     VENTRAL HERNIA REPAIR N/A 04/21/2017   Procedure: HERNIA REPAIR VENTRAL ADULT;  Surgeon: Benancio Bracket, MD;  Location: [facility] ORS;  Service: General;  Laterality: N/A;    SOCIAL HISTORY:   Social History   Tobacco Use   Smoking status: Former    Current packs/day: 0.00    Types: Cigarettes    Quit date: 03/30/1999    Years since quitting: 25.0   Smokeless tobacco: Never  Substance Use Topics   Alcohol use: No    FAMILY HISTORY:   Family History  Problem Relation Age of Onset   Valvular heart disease Mother  Diabetes Father    Cancer Sister        lung   Cancer Sister        cervical   Breast cancer Cousin     DRUG ALLERGIES:   Allergies  Allergen Reactions   Vioxx [Rofecoxib] Swelling    REVIEW OF SYSTEMS:   ROS As per history of present illness. All pertinent systems were reviewed above. Constitutional, HEENT, cardiovascular, respiratory, GI, GU, musculoskeletal, neuro, psychiatric, endocrine, integumentary and hematologic systems were reviewed and are otherwise negative/unremarkable except for positive findings mentioned above in the HPI.   MEDICATIONS AT HOME:   Prior to Admission medications   Medication Sig Start Date End Date Taking? Authorizing Provider  albuterol  (PROVENTIL  HFA;VENTOLIN  HFA) 108 (90 Base) MCG/ACT inhaler Inhale 2 puffs into the lungs every 6 (six) hours as needed for wheezing or shortness of breath.    [provider]  allopurinol  (ZYLOPRIM ) 300 MG tablet Take 300 mg by mouth daily.    [provider]  ascorbic acid  (VITAMIN C ) 500 MG tablet Take 1 tablet (500 mg total) by mouth daily. 03/12/24 06/10/24  Althia Atlas, MD  colchicine   0.6 MG tablet Take 0.6-1.8 mg by mouth as needed (take 1.2 mg (2 tablets) at onset of gout flare, Take one additional tablet one hour later if symptoms persist. Maximum of 3 tablets per day).    [provider]  ELIQUIS  5 MG TABS tablet Take 5 mg by mouth 2 (two) times daily.    [provider]  iron  polysaccharides (NIFEREX) 150 MG capsule Take 1 capsule (150 mg total) by mouth daily. 03/12/24 06/10/24  Althia Atlas, MD  JARDIANCE  10 MG TABS tablet Take 10 mg by mouth daily.    [provider]  latanoprost  (XALATAN ) 0.005 % ophthalmic solution Place 1 drop into both eyes at bedtime.    [provider]  levothyroxine  (SYNTHROID ) 50 MCG tablet Take 50 mcg by mouth daily before breakfast.    [provider]  metFORMIN  (GLUCOPHAGE ) 1000 MG tablet Take 1,000 mg by mouth 2 (two) times daily with a meal.    [provider]  midodrine  (PROAMATINE ) 10 MG tablet Take 1 tablet (10 mg total) by mouth 3 (three) times daily with meals. 03/12/24 06/10/24  Althia Atlas, MD  pantoprazole  (PROTONIX ) 40 MG tablet Take 40 mg by mouth daily. 01/28/21   [provider]  rosuvastatin  (CRESTOR ) 5 MG tablet Take 5 mg by mouth daily. 02/20/24 02/19/25  [provider]  spironolactone  (ALDACTONE ) 25 MG tablet Take 25 mg by mouth daily.    [provider]  sucralfate  (CARAFATE ) 1 g tablet Take 1 g by mouth 4 (four) times daily -  with meals and at bedtime.    [provider]  torsemide  (DEMADEX ) 20 MG tablet Take 1 tablet (20 mg total) by mouth daily. 03/19/24   Luna Salinas, MD      VITAL SIGNS:  Blood pressure 124/76, pulse 86, temperature 97.9 F (36.6 C), temperature source Oral, resp. rate 15, height 5\' 1"  (1.549 m), weight 49.4 kg, SpO2 99%.  PHYSICAL EXAMINATION:  Physical Exam  GENERAL:  68 y.o.-year-old Caucasian female patient lying in the bed with no acute distress.  EYES: Pupils equal, round, reactive to light and  accommodation. No scleral icterus. Extraocular muscles intact.  HEENT: Head atraumatic, normocephalic. Oropharynx and nasopharynx clear.  NECK:  Supple, no jugular venous distention. No thyroid  enlargement, no tenderness.  LUNGS: Normal breath sounds bilaterally, no wheezing, rales,rhonchi  or crepitation. No use of accessory muscles of respiration.  CARDIOVASCULAR: Irregularly irregular rhythm, S1, S2 normal. No murmurs, rubs, or gallops.  ABDOMEN: Soft, nondistended, with mild right upper quadrant tenderness without rebound tenderness guarding or rigidity.. Bowel sounds present. No organomegaly or mass.  EXTREMITIES: No pedal edema, cyanosis, or clubbing.  NEUROLOGIC: Cranial nerves II through XII are intact. Muscle strength 5/5 in all extremities. Sensation intact. Gait not checked.  PSYCHIATRIC: The patient is alert and oriented x 3.  Normal affect and good eye contact. SKIN: No obvious rash, lesion, or ulcer.   LABORATORY PANEL:   CBC Recent Labs  Lab 03/28/24 1535  WBC 6.7  HGB 13.2  HCT 40.4  PLT 166   ------------------------------------------------------------------------------------------------------------------  Chemistries  Recent Labs  Lab 03/28/24 1535 03/28/24 1923  NA 124* 125*  K 4.9 4.9  CL 99 101  CO2 11* 13*  GLUCOSE 134* 120*  BUN 62* 60*  CREATININE 2.55* 2.54*  CALCIUM  10.0 9.0  AST 51*  --   ALT 45*  --   ALKPHOS 283*  --   BILITOT 0.8  --    ------------------------------------------------------------------------------------------------------------------  Cardiac Enzymes No results for input(s): "TROPONINI" in the last 168 hours. ------------------------------------------------------------------------------------------------------------------  RADIOLOGY:  US  ABDOMEN LIMITED RUQ (LIVER/GB) Result Date: 03/28/2024 CLINICAL DATA:  Transaminitis. EXAM: ULTRASOUND ABDOMEN LIMITED RIGHT UPPER QUADRANT COMPARISON:  January 05, 2023 FINDINGS:  Gallbladder: Multiple shadowing, echogenic gallstones are seen within the lumen of a contracted gallbladder (the largest measures 1.1 cm). The gallbladder wall measures 2.1 mm in thickness. No sonographic Murphy sign noted by sonographer. Common bile duct: Diameter: 3.6 mm Liver: No focal lesion identified. The liver parenchyma is nodular in contour and heterogeneous in echotexture. Portal vein is patent on color Doppler imaging with normal direction of blood flow towards the liver. Other: Limited study secondary to overlying bowel gas, as per the ultrasound technologist. IMPRESSION: 1. Cholelithiasis. 2. Hepatic cirrhosis. Electronically Signed   By: Virgle Grime M.D.   On: 03/28/2024 19:29      IMPRESSION AND PLAN:  Assessment and Plan: * Acute kidney injury superimposed on chronic kidney disease (HCC) - This is likely prerenal hypovolemic due to anorexia and vomiting. - The patient be admitted to a medical telemetry observation bed. - Will continue addition with IV normal saline.  Will follow BMP. - Will avoid nephrotoxins. - The patient's baseline creatinine was 1.47 on 03/19/2024.  Increased serum lipase level - This could be early pancreatitis or related to her vomiting. - Will keep her n.p.o. except for medications. - Will continue hydration as mentioned above and follow lipase levels. - Despite cholelithiasis she has no clear signs of obstruction or jaundice.  Hypothyroidism - Will continue Synthroid  and follow TSH.  Atrial fibrillation, chronic (HCC) - Will continue Eliquis  and Coreg .  Dyslipidemia - Will continue statin therapy.  Type 2 diabetes mellitus with chronic kidney disease, without long-term current use of insulin  (HCC) - The patient will be placed on supplemental coverage with NovoLog . - Will continue Jardiance  and hold off metformin .  GERD without esophagitis - Will continue PPI therapy.   DVT prophylaxis: Eliquis . Advanced Care Planning:  Code Status:  full code.  Family Communication:  The plan of care was discussed in details with the patient (and family). I answered all questions. The patient agreed to proceed with the above mentioned plan. Further management will depend upon hospital course. Disposition Plan: Back to previous home environment Consults called: none.  All the records are reviewed and  case discussed with ED provider.  Status is: Observation  I certify that at the time of admission, it is my clinical judgment that the patient will require hospital care extending less than 2 midnights.                            Dispo: The patient is from: Home              Anticipated d/c is to: Home              Patient currently is not medically stable to d/c.              Difficult to place patient: No  Virgene Griffin M.D on 03/29/2024 at 12:15 AM  [facility]   From 7 PM-7 AM, contact night-coverage [email or web address]  CC: Primary care physician; Lyle San, MD

## 2024-03-28 NOTE — ED Notes (Signed)
 Pt called for a treatment room. Pt did not answer. Pt not visualized in the lobby.

## 2024-03-28 NOTE — Telephone Encounter (Signed)
 The friend wants to see to come here for IVF but she is only a patient for anemia and the problems are kidney and heart issues and she has ileostomy. I did check with Randy Buttery and she checked with Ruthann Cover and we can't do IVF. Tammy Arias is ok about it

## 2024-03-28 NOTE — ED Notes (Signed)
 EDP at [facility]

## 2024-03-28 NOTE — ED Triage Notes (Signed)
 Patient had all of colon removed 02/27/24. C/o dehydration intermittently since surgery. Today c/o emesis, fullness and shaky.   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

## 2024-03-29 ENCOUNTER — Ambulatory Visit (HOSPITAL_COMMUNITY): Admitting: Nurse Practitioner

## 2024-03-29 DIAGNOSIS — E785 Hyperlipidemia, unspecified: Secondary | ICD-10-CM | POA: Diagnosis present

## 2024-03-29 DIAGNOSIS — M109 Gout, unspecified: Secondary | ICD-10-CM | POA: Diagnosis present

## 2024-03-29 DIAGNOSIS — E1122 Type 2 diabetes mellitus with diabetic chronic kidney disease: Secondary | ICD-10-CM | POA: Diagnosis present

## 2024-03-29 DIAGNOSIS — I482 Chronic atrial fibrillation, unspecified: Secondary | ICD-10-CM | POA: Diagnosis present

## 2024-03-29 DIAGNOSIS — N1831 Chronic kidney disease, stage 3a: Secondary | ICD-10-CM | POA: Diagnosis not present

## 2024-03-29 DIAGNOSIS — Z932 Ileostomy status: Secondary | ICD-10-CM | POA: Diagnosis not present

## 2024-03-29 DIAGNOSIS — E039 Hypothyroidism, unspecified: Secondary | ICD-10-CM | POA: Diagnosis present

## 2024-03-29 DIAGNOSIS — N189 Chronic kidney disease, unspecified: Secondary | ICD-10-CM | POA: Diagnosis not present

## 2024-03-29 DIAGNOSIS — E871 Hypo-osmolality and hyponatremia: Secondary | ICD-10-CM | POA: Diagnosis present

## 2024-03-29 DIAGNOSIS — E872 Acidosis, unspecified: Secondary | ICD-10-CM | POA: Diagnosis present

## 2024-03-29 DIAGNOSIS — K766 Portal hypertension: Secondary | ICD-10-CM | POA: Diagnosis present

## 2024-03-29 DIAGNOSIS — I130 Hypertensive heart and chronic kidney disease with heart failure and stage 1 through stage 4 chronic kidney disease, or unspecified chronic kidney disease: Secondary | ICD-10-CM | POA: Diagnosis present

## 2024-03-29 DIAGNOSIS — I081 Rheumatic disorders of both mitral and tricuspid valves: Secondary | ICD-10-CM | POA: Diagnosis present

## 2024-03-29 DIAGNOSIS — K219 Gastro-esophageal reflux disease without esophagitis: Secondary | ICD-10-CM | POA: Diagnosis present

## 2024-03-29 DIAGNOSIS — Z7984 Long term (current) use of oral hypoglycemic drugs: Secondary | ICD-10-CM | POA: Diagnosis not present

## 2024-03-29 DIAGNOSIS — Z7901 Long term (current) use of anticoagulants: Secondary | ICD-10-CM | POA: Diagnosis not present

## 2024-03-29 DIAGNOSIS — I5022 Chronic systolic (congestive) heart failure: Secondary | ICD-10-CM | POA: Diagnosis present

## 2024-03-29 DIAGNOSIS — I255 Ischemic cardiomyopathy: Secondary | ICD-10-CM | POA: Diagnosis present

## 2024-03-29 DIAGNOSIS — Y92009 Unspecified place in unspecified non-institutional (private) residence as the place of occurrence of the external cause: Secondary | ICD-10-CM | POA: Diagnosis not present

## 2024-03-29 DIAGNOSIS — N1832 Chronic kidney disease, stage 3b: Secondary | ICD-10-CM | POA: Diagnosis present

## 2024-03-29 DIAGNOSIS — R748 Abnormal levels of other serum enzymes: Secondary | ICD-10-CM | POA: Diagnosis present

## 2024-03-29 DIAGNOSIS — I272 Pulmonary hypertension, unspecified: Secondary | ICD-10-CM | POA: Diagnosis present

## 2024-03-29 DIAGNOSIS — K7460 Unspecified cirrhosis of liver: Secondary | ICD-10-CM | POA: Diagnosis present

## 2024-03-29 DIAGNOSIS — E860 Dehydration: Secondary | ICD-10-CM | POA: Diagnosis present

## 2024-03-29 DIAGNOSIS — D7589 Other specified diseases of blood and blood-forming organs: Secondary | ICD-10-CM | POA: Diagnosis present

## 2024-03-29 DIAGNOSIS — N179 Acute kidney failure, unspecified: Secondary | ICD-10-CM | POA: Diagnosis present

## 2024-03-29 DIAGNOSIS — I2510 Atherosclerotic heart disease of native coronary artery without angina pectoris: Secondary | ICD-10-CM | POA: Diagnosis present

## 2024-03-29 LAB — BASIC METABOLIC PANEL WITH GFR
Anion gap: 11 (ref 5–15)
BUN: 42 mg/dL — ABNORMAL HIGH (ref 8–23)
CO2: 13 mmol/L — ABNORMAL LOW (ref 22–32)
Calcium: 9.2 mg/dL (ref 8.9–10.3)
Chloride: 104 mmol/L (ref 98–111)
Creatinine, Ser: 1.52 mg/dL — ABNORMAL HIGH (ref 0.44–1.00)
GFR, Estimated: 37 mL/min — ABNORMAL LOW (ref >60)
Glucose, Bld: 107 mg/dL — ABNORMAL HIGH (ref 70–99)
Potassium: 4.6 mmol/L (ref 3.5–5.1)
Sodium: 128 mmol/L — ABNORMAL LOW (ref 135–145)

## 2024-03-29 LAB — COMPREHENSIVE METABOLIC PANEL WITH GFR
ALT: 36 U/L (ref 0–44)
AST: 39 U/L (ref 15–41)
Albumin: 4.1 g/dL (ref 3.5–5.0)
Alkaline Phosphatase: 228 U/L — ABNORMAL HIGH (ref 38–126)
Anion gap: 10 (ref 5–15)
BUN: 54 mg/dL — ABNORMAL HIGH (ref 8–23)
CO2: 12 mmol/L — ABNORMAL LOW (ref 22–32)
Calcium: 9.2 mg/dL (ref 8.9–10.3)
Chloride: 104 mmol/L (ref 98–111)
Creatinine, Ser: 2.12 mg/dL — ABNORMAL HIGH (ref 0.44–1.00)
GFR, Estimated: 25 mL/min — ABNORMAL LOW (ref >60)
Glucose, Bld: 92 mg/dL (ref 70–99)
Potassium: 4.6 mmol/L (ref 3.5–5.1)
Sodium: 126 mmol/L — ABNORMAL LOW (ref 135–145)
Total Bilirubin: 0.9 mg/dL (ref 0.0–1.2)
Total Protein: 7.5 g/dL (ref 6.5–8.1)

## 2024-03-29 LAB — OSMOLALITY: Osmolality: 289 mosm/kg (ref 275–295)

## 2024-03-29 LAB — GLUCOSE, CAPILLARY: Glucose-Capillary: 115 mg/dL — ABNORMAL HIGH (ref 70–99)

## 2024-03-29 LAB — CBC
HCT: 31.6 % — ABNORMAL LOW (ref 36.0–46.0)
Hemoglobin: 10.2 g/dL — ABNORMAL LOW (ref 12.0–15.0)
MCH: 32.9 pg (ref 26.0–34.0)
MCHC: 32.3 g/dL (ref 30.0–36.0)
MCV: 101.9 fL — ABNORMAL HIGH (ref 80.0–100.0)
Platelets: 109 10*3/uL — ABNORMAL LOW (ref 150–400)
RBC: 3.1 MIL/uL — ABNORMAL LOW (ref 3.87–5.11)
RDW: 18.5 % — ABNORMAL HIGH (ref 11.5–15.5)
WBC: 4.5 10*3/uL (ref 4.0–10.5)
nRBC: 0 % (ref 0.0–0.2)

## 2024-03-29 LAB — CBG MONITORING, ED
Glucose-Capillary: 80 mg/dL (ref 70–99)
Glucose-Capillary: 90 mg/dL (ref 70–99)
Glucose-Capillary: 122 mg/dL — ABNORMAL HIGH (ref 70–99)
Glucose-Capillary: 99 mg/dL (ref 70–99)

## 2024-03-29 LAB — LIPASE, BLOOD: Lipase: 194 U/L — ABNORMAL HIGH (ref 11–51)

## 2024-03-29 MED ORDER — INSULIN ASPART 100 UNIT/ML IJ SOLN
0.0000 [IU] | Freq: Three times a day (TID) | INTRAMUSCULAR | Status: DC
  Administered 2024-03-30 (×2): 3 [IU] via SUBCUTANEOUS
  Administered 2024-03-31: 5 [IU] via SUBCUTANEOUS
  Administered 2024-03-31: 2 [IU] via SUBCUTANEOUS
  Filled 2024-03-29 (×4): qty 1

## 2024-03-29 MED ORDER — HYDRALAZINE HCL 25 MG PO TABS: 25.0000 mg | ORAL_TABLET | Freq: Four times a day (QID) | ORAL | Status: DC | PRN

## 2024-03-29 MED ORDER — SODIUM BICARBONATE 650 MG PO TABS
650.0000 mg | ORAL_TABLET | Freq: Three times a day (TID) | ORAL | Status: DC
  Administered 2024-03-29 – 2024-03-31 (×5): 650 mg via ORAL
  Filled 2024-03-29 (×6): qty 1

## 2024-03-29 MED ORDER — INSULIN ASPART 100 UNIT/ML IJ SOLN: 0.0000 [IU] | Freq: Every day | INTRAMUSCULAR | Status: DC

## 2024-03-29 NOTE — Assessment & Plan Note (Signed)
-   This could be early pancreatitis or related to her vomiting. - Will keep her n.p.o. except for medications. - Will continue hydration as mentioned above and follow lipase levels. - Despite cholelithiasis she has no clear signs of obstruction or jaundice.

## 2024-03-29 NOTE — Assessment & Plan Note (Signed)
-   Will continue Synthroid  and follow TSH.

## 2024-03-29 NOTE — Assessment & Plan Note (Signed)
 Will continue PPI therapy.

## 2024-03-29 NOTE — Progress Notes (Signed)
 Progress Note   Patient: Tammy Arias:[id number] DOB: 1956-01-28 DOA: 03/28/2024     0 DOS: the patient was seen and examined on 03/29/2024   Brief hospital course: HPI from admission 03/28/24 PM: "DAMITA CIACCIA is a 68 y.o. female with medical history significant for CHF, CKD stage IIIb, osteoarthritis, atrial fibrillation, coronary artery disease, HFrEF, GERD, hypertension and hypothyroidism as well as type 2 diabetes mellitus, who comes to the emergency room with acute onset of diminished appetite and feeling dehydrated as well as vomiting today.  She stated that she lost appetite last couple of days and has been trying to force herself to eat.  She feels weak and shaky.  She lost 2 to 3 pounds.  She has been having diminished urine output. ..." See H&P for full HPI on admission & ED course.  Patient was admitted for further evaluation and management of AKI, hyponatremia, elevated lipase as outlined in detail below.    Assessment and Plan:  AKI superimposed on CKD stage IIIb Baseline creatinine was 1.47 on 03/19/2024. Cr on admission 2.55 Improving with IV hydration --Continue NS @ 75 cc/hr --Hold diuretics and other nephrotoxins --Maintain MAP > 65 for adequate renal perfusion --Renally dose meds  Hyponatremia - Na 124 on admission, likely hypovolemic in setting of diuretics. Mildly hypertonic - serum osmolality 289 Improving with NS fluids 124 >> 125 >> 126 --Trend Na levels --Continue NS fluids, reduce rate given low EF and valvular disease --Repeat BMP this afternoon and AM --Holding diuretics  Increased serum lipase level Despite cholelithiasis she has no signs of obstruction or jaundice. Suspect early pancreatitis or related to her vomiting. - Pt tolerating diet with N/V currently.  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  Dyslipidemia - Continue statin therapy.  Type 2 diabetes mellitus with chronic kidney disease, without long-term current use of insulin  (HCC) - Sliding scale NovoLog . - Continue Jardiance  --Hold metformin . GERD without esophagitis - Continue PPI therapy.    Subjective: Pt seen in the Ed holding for a bed today. She reports feeling better since admission.  Abdominal pain improved, no N/V after meal this afternoon.  No other acute complaints.  She expresses frustration about recurrent admissions for same problem.  States she has TEE scheduled Monday at [facility] and hopes to make it.   Physical Exam: Vitals:   03/29/24 0908 03/29/24 0909 03/29/24 1302 03/29/24 1621  BP:  115/69 118/70 124/75  Pulse:   74 78  Resp:   18 20  Temp: 97.7 F (36.5 C) 97.7 F (36.5 C) 98.2 F (36.8 C) 98.7 F (37.1 C)  TempSrc: Oral Oral Oral Oral  SpO2: 100%  98% 97%  Weight:      Height:       General exam: awake, alert, no acute distress HEENT: atraumatic, clear conjunctiva, anicteric sclera, moist mucus membranes, hearing grossly normal  Respiratory system: CTAB, no wheezes, rales or rhonchi, normal respiratory effort. Cardiovascular system: normal S1/S2, RRR, no pedal edema.   Gastrointestinal system: ileoileostomy present, soft, NT, ND Central nervous system: A&O x3. no gross focal neurologic deficits, normal speech Extremities: moves all, no edema, normal tone Skin: dry, intact, normal temperature Psychiatry:  normal mood, congruent affect, judgement and insight appear normal   Data Reviewed:  Notable labs --  Na 124 >> 125 >> 126 Bicarb 12 (gap 10) BUN 54 Cr 2.55 >> 2.54 >> 2.12 Alk phoe 228 Lipase 301 >> 194  Hbg diluted 13.2 >> 10.2 on fluids Platelets 166 >> 109 also  likely dilutional   Family Communication: None present. Will attempt to call as time allows.  Disposition: Status is: Inpatient Remains inpatient appropriate because: remains on IV fluids pending improvement in sodium and renal function   Planned Discharge Destination: Home    Time spent: 52 minutes including time at bedside and coordination of care  Author: Montey Apa, DO 03/29/2024 5:03 PM  For on call review [email or web address].

## 2024-03-29 NOTE — Care Management Obs Status (Signed)
 MEDICARE OBSERVATION STATUS NOTIFICATION   Patient Details  Name: Tammy Arias MRN: [id number] Date of Birth: Sep 18, 1956   Medicare Observation Status Notification Given:  Rudolph Cost, CMA 03/29/2024, 2:46 PM

## 2024-03-29 NOTE — Assessment & Plan Note (Signed)
-   Will continue Eliquis and Coreg.

## 2024-03-29 NOTE — Assessment & Plan Note (Signed)
 Will continue statin therapy

## 2024-03-29 NOTE — Assessment & Plan Note (Signed)
-   The patient will be placed on supplemental coverage with NovoLog . - Will continue Jardiance  and hold off metformin .

## 2024-03-29 NOTE — Assessment & Plan Note (Signed)
-   This is likely prerenal hypovolemic due to anorexia and vomiting. - The patient be admitted to a medical telemetry observation bed. - Will continue addition with IV normal saline.  Will follow BMP. - Will avoid nephrotoxins. - The patient's baseline creatinine was 1.47 on 03/19/2024.

## 2024-03-30 DIAGNOSIS — N179 Acute kidney failure, unspecified: Secondary | ICD-10-CM | POA: Diagnosis not present

## 2024-03-30 DIAGNOSIS — N189 Chronic kidney disease, unspecified: Secondary | ICD-10-CM | POA: Diagnosis not present

## 2024-03-30 LAB — GLUCOSE, CAPILLARY
Glucose-Capillary: 91 mg/dL (ref 70–99)
Glucose-Capillary: 330 mg/dL — ABNORMAL HIGH (ref 70–99)
Glucose-Capillary: 187 mg/dL — ABNORMAL HIGH (ref 70–99)
Glucose-Capillary: 180 mg/dL — ABNORMAL HIGH (ref 70–99)
Glucose-Capillary: 134 mg/dL — ABNORMAL HIGH (ref 70–99)

## 2024-03-30 LAB — CBC
HCT: 34.1 % — ABNORMAL LOW (ref 36.0–46.0)
Hemoglobin: 11.1 g/dL — ABNORMAL LOW (ref 12.0–15.0)
MCH: 33.1 pg (ref 26.0–34.0)
MCHC: 32.6 g/dL (ref 30.0–36.0)
MCV: 101.8 fL — ABNORMAL HIGH (ref 80.0–100.0)
Platelets: 109 10*3/uL — ABNORMAL LOW (ref 150–400)
RBC: 3.35 MIL/uL — ABNORMAL LOW (ref 3.87–5.11)
RDW: 18.7 % — ABNORMAL HIGH (ref 11.5–15.5)
WBC: 3.1 10*3/uL — ABNORMAL LOW (ref 4.0–10.5)
nRBC: 0 % (ref 0.0–0.2)

## 2024-03-30 LAB — COMPREHENSIVE METABOLIC PANEL WITH GFR
ALT: 33 U/L (ref 0–44)
AST: 37 U/L (ref 15–41)
Albumin: 4 g/dL (ref 3.5–5.0)
Alkaline Phosphatase: 206 U/L — ABNORMAL HIGH (ref 38–126)
Anion gap: 11 (ref 5–15)
BUN: 36 mg/dL — ABNORMAL HIGH (ref 8–23)
CO2: 15 mmol/L — ABNORMAL LOW (ref 22–32)
Calcium: 9.7 mg/dL (ref 8.9–10.3)
Chloride: 107 mmol/L (ref 98–111)
Creatinine, Ser: 1.3 mg/dL — ABNORMAL HIGH (ref 0.44–1.00)
GFR, Estimated: 45 mL/min — ABNORMAL LOW (ref >60)
Glucose, Bld: 106 mg/dL — ABNORMAL HIGH (ref 70–99)
Potassium: 4 mmol/L (ref 3.5–5.1)
Sodium: 133 mmol/L — ABNORMAL LOW (ref 135–145)
Total Bilirubin: 0.7 mg/dL (ref 0.0–1.2)
Total Protein: 7.5 g/dL (ref 6.5–8.1)

## 2024-03-30 LAB — MAGNESIUM: Magnesium: 1.6 mg/dL — ABNORMAL LOW (ref 1.7–2.4)

## 2024-03-30 LAB — LIPASE, BLOOD: Lipase: 272 U/L — ABNORMAL HIGH (ref 11–51)

## 2024-03-30 LAB — PHOSPHORUS: Phosphorus: 3.2 mg/dL (ref 2.5–4.6)

## 2024-03-30 MED ORDER — SODIUM CHLORIDE 0.9 % IV SOLN: INTRAVENOUS | Status: AC

## 2024-03-30 MED ORDER — MAGNESIUM SULFATE 2 GM/50ML IV SOLN
2.0000 g | Freq: Once | INTRAVENOUS | Status: AC
  Administered 2024-03-30: 2 g via INTRAVENOUS
  Filled 2024-03-30: qty 50

## 2024-03-30 NOTE — Progress Notes (Signed)
 Progress Note   Patient: Tammy Arias ZOX:[id number] DOB: Aug 03, 1956 DOA: 03/28/2024     1 DOS: the patient was seen and examined on 03/30/2024   Brief hospital course: HPI from admission 03/28/24 PM: "Tammy Arias is a 68 y.o. female with medical history significant for CHF, CKD stage IIIb, osteoarthritis, atrial fibrillation, coronary artery disease, HFrEF, GERD, hypertension and hypothyroidism as well as type 2 diabetes mellitus, who comes to the emergency room with acute onset of diminished appetite and feeling dehydrated as well as vomiting today.  She stated that she lost appetite last couple of days and has been trying to force herself to eat.  She feels weak and shaky.  She lost 2 to 3 pounds.  She has been having diminished urine output. ..." See H&P for full HPI on admission & ED course.  Patient was admitted for further evaluation and management of AKI, hyponatremia, elevated lipase as outlined in detail below.    Assessment and Plan:  AKI superimposed on CKD stage IIIb Baseline creatinine was 1.47 on 03/19/2024. Cr on admission 2.55 >> ... 1.52 >> 1.30 this AM AKI RESOLVED with IV hydration --Continue NS @ 50 cc/hr to further improve hyponatremia --Hold diuretics and other nephrotoxins --Maintain MAP > 65 for adequate renal perfusion --Renally dose meds  Hyponatremia - Na 124 on admission, likely hypovolemic in setting of diuretics. Mildly hypertonic - serum osmolality 289 Improving with NS fluids 124 >> 125 >> 126 >> 128 >> 133 --Trend Na levels --Will continue gentle NS fluids today --Repeat BMP in AM --Holding diuretics - will plan to d/c on daily PRN diuretic only given frequent recurrent dehydration and electrolyte derangements   Hypomagnesemia - Mg 1.6 --2g IV Mg-sulfate to replace --Monitor Mg and replace PRN  Increased serum lipase level Despite cholelithiasis she has no signs of obstruction or jaundice. Suspect early pancreatitis or related to her  vomiting. - Pt tolerating diet with N/V currently.  Low threshold to de-escalate diet to liquids --Trend lipase and monitor LFT's - IV fluids  Chronic HFrEF  Valvular heart disease (severe MR, severe TR) --Holding diuretics --Monitor volume status closely on IV fluids --Io's and daily weights --Monitor renal function and electrolytes  Hypothyroidism - Continue Synthroid    Atrial fibrillation, chronic (HCC) - Continue Eliquis  and Coreg .  Dyslipidemia - Continue statin therapy.  Type 2 diabetes mellitus with chronic kidney disease, without long-term current use of insulin  (HCC) - Sliding scale NovoLog . - Continue Jardiance  --Hold metformin   GERD without esophagitis - Continue PPI     Subjective: Pt seen wit her friend at bedside this AM.  She reports feeling better.  She denies abdominal pain or nausea/vomiting. Tolerating meals well.     Physical Exam: Vitals:   03/29/24 2021 03/30/24 0329 03/30/24 0753 03/30/24 1341  BP: (!) 98/57 110/61 (!) 106/54 (!) 115/59  Pulse: 72 82 74 81  Resp: 15 15 16 16   Temp:  98.1 F (36.7 C) 97.6 F (36.4 C) 97.7 F (36.5 C)  TempSrc: Oral     SpO2: 99% 100% 100% 98%  Weight:  50.2 kg    Height:       General exam: awake, alert, no acute distress Respiratory system: CTAB, no wheezes, rales or rhonchi, normal respiratory effort. Cardiovascular system: normal S1/S2, RRR, no pedal edema.   Gastrointestinal system: ileoileostomy present, soft, NT, ND Central nervous system: A&O x3. no gross focal neurologic deficits, normal speech Extremities: moves all, no edema, normal tone Skin: dry, intact, normal  temperature Psychiatry: normal mood, congruent affect, judgement and insight appear normal   Data Reviewed:  Notable labs --  Na 124 >> 125 >> 126 >> 128 >> 133 after NS fluids Bicarb 12 >> 13 >> 15 BUN 54 >> 42 >> 36 Cr 2.55 >> 2.54 >> 2.12 >> 1.53 >> 1.30 Mg 1.6  Alk phos 228 >> 206 Lipase 301 >> 194 >> 272 (pt  asymptomatic)  WBC 3.1 Hbg diluted 13.2 >> 10.2 on fluids >> 11.1 Platelets 166 >> 109 also likely dilutional >> 109   Family Communication: Patient's friend [name] at bedside on rounds today  Disposition: Status is: Inpatient Remains inpatient appropriate because: remains on IV fluids pending improvement in sodium and renal function   Planned Discharge Destination: Home    Time spent: 45 minutes  Author: Montey Apa, DO 03/30/2024 2:07 PM  For on call review [email or web address].

## 2024-03-31 DIAGNOSIS — N1831 Chronic kidney disease, stage 3a: Secondary | ICD-10-CM

## 2024-03-31 LAB — BASIC METABOLIC PANEL WITH GFR
Anion gap: 10 (ref 5–15)
BUN: 26 mg/dL — ABNORMAL HIGH (ref 8–23)
CO2: 13 mmol/L — ABNORMAL LOW (ref 22–32)
Calcium: 9.8 mg/dL (ref 8.9–10.3)
Chloride: 110 mmol/L (ref 98–111)
Creatinine, Ser: 0.96 mg/dL (ref 0.44–1.00)
GFR, Estimated: >60 mL/min (ref >60)
Glucose, Bld: 126 mg/dL — ABNORMAL HIGH (ref 70–99)
Potassium: 4.2 mmol/L (ref 3.5–5.1)
Sodium: 133 mmol/L — ABNORMAL LOW (ref 135–145)

## 2024-03-31 LAB — GLUCOSE, CAPILLARY
Glucose-Capillary: 128 mg/dL — ABNORMAL HIGH (ref 70–99)
Glucose-Capillary: 209 mg/dL — ABNORMAL HIGH (ref 70–99)

## 2024-03-31 LAB — LIPASE, BLOOD: Lipase: 273 U/L — ABNORMAL HIGH (ref 11–51)

## 2024-03-31 LAB — MAGNESIUM: Magnesium: 2.2 mg/dL (ref 1.7–2.4)

## 2024-03-31 MED ORDER — TORSEMIDE 20 MG PO TABS: 20.0000 mg | ORAL_TABLET | Freq: Every day | ORAL | Status: DC | PRN

## 2024-03-31 MED ORDER — APIXABAN 2.5 MG PO TABS: 2.5000 mg | ORAL_TABLET | Freq: Two times a day (BID) | ORAL | Status: DC

## 2024-03-31 MED ORDER — SPIRONOLACTONE 25 MG PO TABS: 12.5000 mg | ORAL_TABLET | Freq: Every day | ORAL | Status: DC

## 2024-03-31 MED ORDER — POTASSIUM CHLORIDE ER 10 MEQ PO TBCR: 10.0000 meq | EXTENDED_RELEASE_TABLET | Freq: Every day | ORAL | Status: DC | PRN

## 2024-03-31 MED ORDER — SODIUM BICARBONATE 650 MG PO TABS: 650.0000 mg | ORAL_TABLET | Freq: Three times a day (TID) | ORAL | 0 refills | Status: DC

## 2024-03-31 NOTE — Discharge Summary (Signed)
 Physician Discharge Summary   Patient: Tammy Arias MRN: [id number] DOB: [birth year]  Admit date:     03/28/2024  Discharge date: 03/31/2024  Discharge Physician: Montey Apa   PCP: Lyle San, MD   Recommendations at discharge:    Follow up tomorrow at [facility] as scheduled for TEE Follow up within 1 week at [facility] Repeat CBC, CMP, Mg, lipase within 1 week   Discharge Diagnoses: Principal Problem:   Acute kidney injury superimposed on chronic kidney disease (HCC) Active Problems:   AKI (acute kidney injury) (HCC)   CAD with hx of CABG   Increased serum lipase level   Pulmonary hypertension (HCC)   Hypothyroidism   Atrial fibrillation, chronic (HCC)   Chronic systolic CHF (congestive heart failure) (HCC)   HTN (hypertension)   Hyperlipidemia   Gout   Elevated lipase   Ischemic cardiomyopathy   Type 2 diabetes mellitus (HCC)   Cirrhosis and Portal hypertension  on CT(HCC)   GERD without esophagitis   Type 2 diabetes mellitus with chronic kidney disease, without long-term current use of insulin  (HCC)   Dyslipidemia  Resolved problems - AKI   Hospital Course:  HPI from admission 03/28/24 PM: "Tammy Arias is a 68 y.o. female with medical history significant for CHF, CKD stage IIIb, osteoarthritis, atrial fibrillation, coronary artery disease, HFrEF, GERD, hypertension and hypothyroidism as well as type 2 diabetes mellitus, who comes to the emergency room with acute onset of diminished appetite and feeling dehydrated as well as vomiting today.  She stated that she lost appetite last couple of days and has been trying to force herself to eat.  She feels weak and shaky.  She lost 2 to 3 pounds.  She has been having diminished urine output. ..." See H&P for full HPI on admission & ED course.   Patient was admitted for further evaluation and management of AKI, hyponatremia, elevated lipase as outlined in detail below.  5/4 -- pt seen this AM with her  friend/caregiver at bedside.  She reports feeling well.  Denies abdominal pain, nausea/vomiting, appetite is improved.    Patient has TEE scheduled at [facility] tomorrow for surgical planning for mitral clip and  watchman device repair/replacement. We discussed her overall improved labs but ongoing need for close monitoring if her ostomy output continued to be high.  We discussed ongoing hospital stay for further close monitoring and IV fluids, but patient does not want to miss appointment tomorrow, since she feels heart issues are driving a lot of her recent issues and admissions.  We discussed as needed diuretic use and pt and friend were agreeable with the approach.       Assessment and Plan:  AKI superimposed on CKD stage IIIb Baseline creatinine was 1.47 on 03/19/2024. Cr on admission 2.55 >> ... 1.52 >> 1.30 this AM AKI RESOLVED with IV hydration Suspect over-diuresis with torsemide , in addition to relatively new ileostomy's impact on overall fluid status --Treated with IV NS fluids --Hold diuretics and other nephrotoxins --Maintain MAP > 65 for adequate renal perfusion --Renally dose meds   Hyponatremia - Na 124 on admission, likely hypovolemic in setting of diuretics. Mildly hypertonic - serum osmolality 289 Improving with NS fluids 124 >> 125 >> 126 >> 128 >> 133 --Repeat BMP in 2-3 days --Holding diuretics -  d/c on daily PRN diuretic only given frequent recurrent dehydration and electrolyte derangements     Hypomagnesemia - Mg 1.6 was replaced with 2g IV Mg-sulfate  --Monitor Mg and  replace PRN   Increased serum lipase level Despite cholelithiasis she has no signs of obstruction or jaundice.  Pt tolerating diet without  N/V since admission.  -Trend lipase and monitor LFT's - Treated with IV fluids   Chronic HFrEF  Valvular heart disease (severe MR, severe TR) --Holding diuretics --Monitor volume status closely on IV fluids --Io's and daily weights --Monitor renal function  and electrolytes   Hypothyroidism - Continue Synthroid     Atrial fibrillation, chronic (HCC) - Continue Eliquis  and Coreg .   Dyslipidemia - Continue statin therapy.   Type 2 diabetes mellitus with chronic kidney disease, without long-term current use of insulin  (HCC) - covered with Sliding scale NovoLog  - Continue Jardiance  --Hold metformin  - resume on discharge    GERD without esophagitis - Continue PPI   Non-anion Gap Metabolic Acidosis - likely high ileostomy output in addition to normal saline fluids. --Discussed with patient, typically would continue admission and run bicarb IV fluids.  However, pt has a long-awaited TEE scheduled tomorrow for evaluation of mitral clip and her Watchman device, and would prefer discharge today --Will send on PO sodium bicarb tablets --Repeat labs within 1 week --Stool bulking agent like Citrocel or Metamucil recommended       Consultants: none Procedures performed: none  Disposition: Home Diet recommendation:  Cardiac and Carb modified diet DISCHARGE MEDICATION: Allergies as of 03/31/2024       Reactions   Vioxx [rofecoxib] Swelling        Medication List     STOP taking these medications    carvedilol  6.25 MG tablet Commonly known as: COREG    sucralfate  1 g tablet Commonly known as: CARAFATE        TAKE these medications    albuterol  108 (90 Base) MCG/ACT inhaler Commonly known as: VENTOLIN  HFA Inhale 2 puffs into the lungs every 6 (six) hours as needed for wheezing or shortness of breath.   allopurinol  300 MG tablet Commonly known as: ZYLOPRIM  Take 300 mg by mouth daily.   apixaban  2.5 MG Tabs tablet Commonly known as: ELIQUIS  Take 1 tablet (2.5 mg total) by mouth 2 (two) times daily. What changed:  medication strength how much to take   ascorbic acid  500 MG tablet Commonly known as: VITAMIN C  Take 1 tablet (500 mg total) by mouth daily.   colchicine  0.6 MG tablet Take 0.6-1.8 mg by mouth as needed  (take 1.2 mg (2 tablets) at onset of gout flare, Take one additional tablet one hour later if symptoms persist. Maximum of 3 tablets per day).   iron  polysaccharides 150 MG capsule Commonly known as: NIFEREX Take 1 capsule (150 mg total) by mouth daily.   Jardiance  10 MG Tabs tablet Generic drug: empagliflozin  Take 10 mg by mouth daily.   latanoprost  0.005 % ophthalmic solution Commonly known as: XALATAN  Place 1 drop into both eyes at bedtime.   levothyroxine  50 MCG tablet Commonly known as: SYNTHROID  Take 50 mcg by mouth daily before breakfast.   metFORMIN  1000 MG tablet Commonly known as: GLUCOPHAGE  Take 1,000 mg by mouth 2 (two) times daily with a meal.   midodrine  10 MG tablet Commonly known as: PROAMATINE  Take 1 tablet (10 mg total) by mouth 3 (three) times daily with meals.   pantoprazole  40 MG tablet Commonly known as: PROTONIX  Take 40 mg by mouth daily.   potassium chloride  10 MEQ tablet Commonly known as: KLOR-CON  Take 1 tablet (10 mEq total) by mouth daily as needed (if you need to take torsemide , also take  potassium). What changed:  when to take this reasons to take this   rosuvastatin  5 MG tablet Commonly known as: CRESTOR  Take 5 mg by mouth daily.   sodium bicarbonate  650 MG tablet Take 1 tablet (650 mg total) by mouth 3 (three) times daily.   spironolactone  25 MG tablet Commonly known as: ALDACTONE  Take 0.5 tablets (12.5 mg total) by mouth daily. What changed: how much to take   torsemide  20 MG tablet Commonly known as: DEMADEX  Take 1 tablet (20 mg total) by mouth daily as needed (for weight gain or swelling). What changed:  when to take this reasons to take this        Discharge Exam: Filed Weights   03/28/24 1541 03/30/24 0329 03/31/24 0500  Weight: 49.4 kg 50.2 kg 50.8 kg   General exam: awake, alert, no acute distress, frail HEENT: atraumatic, clear conjunctiva, anicteric sclera, moist mucus membranes, hearing grossly normal   Respiratory system: CTAB, no wheezes, rales or rhonchi, normal respiratory effort. Cardiovascular system: normal S1/S2, RRR, no JVD, murmurs, rubs, gallops, no pedal edema.   Gastrointestinal system: soft, NT, ND, no HSM felt, +bowel sounds. Ileostomy with dark brown liquid stool Central nervous system: A&O x4. no gross focal neurologic deficits, normal speech Extremities: moves all, no edema, normal tone Skin: dry, intact, normal temperature, normal color, No rashes, lesions or ulcers Psychiatry: normal mood, congruent affect, judgement and insight appear normal   Condition at discharge: stable  The results of significant diagnostics from this hospitalization (including imaging, microbiology, ancillary and laboratory) are listed below for reference.   Imaging Studies: US  ABDOMEN LIMITED RUQ (LIVER/GB) Result Date: 03/28/2024 CLINICAL DATA:  Transaminitis. EXAM: ULTRASOUND ABDOMEN LIMITED RIGHT UPPER QUADRANT COMPARISON:  January 05, 2023 FINDINGS: Gallbladder: Multiple shadowing, echogenic gallstones are seen within the lumen of a contracted gallbladder (the largest measures 1.1 cm). The gallbladder wall measures 2.1 mm in thickness. No sonographic Murphy sign noted by sonographer. Common bile duct: Diameter: 3.6 mm Liver: No focal lesion identified. The liver parenchyma is nodular in contour and heterogeneous in echotexture. Portal vein is patent on color Doppler imaging with normal direction of blood flow towards the liver. Other: Limited study secondary to overlying bowel gas, as per the ultrasound technologist. IMPRESSION: 1. Cholelithiasis. 2. Hepatic cirrhosis. Electronically Signed   By: Virgle Grime M.D.   On: 03/28/2024 19:29   CT ABDOMEN PELVIS WO CONTRAST Result Date: 03/17/2024 CLINICAL DATA:  Postop emesis.  Recent colectomy.  Abdominal pain. EXAM: CT ABDOMEN AND PELVIS WITHOUT CONTRAST TECHNIQUE: Multidetector CT imaging of the abdomen and pelvis was performed following the  standard protocol without IV contrast. RADIATION DOSE REDUCTION: This exam was performed according to the departmental dose-optimization program which includes automated exposure control, adjustment of the mA and/or kV according to patient size and/or use of iterative reconstruction technique. COMPARISON:  CT abdomen pelvis 02/27/2024 FINDINGS: Lower chest: Cardiomegaly.  No acute abnormality. Hepatobiliary: Cholelithiasis without evidence of acute cholecystitis. Unremarkable noncontrast appearance of the liver. Pancreas: Unremarkable. Spleen: Unremarkable. Adrenals/Urinary Tract: Stable adrenal glands. No urinary calculi or hydronephrosis. Unremarkable bladder. 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 Right lower quadrant ileostomy. Enteric contrast is visualized within the stomach and small bowel. No bowel wall thickening or obstruction. Vascular/Lymphatic: Aortic atherosclerosis. No enlarged abdominal or pelvic lymph nodes. Reproductive: Uterus and bilateral adnexa are unremarkable. Other: Small volume free fluid in the pelvis is likely postoperative. No free intraperitoneal air. Musculoskeletal: No acute fracture. IMPRESSION: 1. Interval colectomy with right lower  quadrant ileostomy. No bowel obstruction. 2. Small volume free fluid in the pelvis is likely postoperative. 3. Cholelithiasis without evidence of acute cholecystitis. 4. Aortic Atherosclerosis (ICD10-I70.0). Electronically Signed   By: Rozell Cornet M.D.   On: 03/17/2024 20:45   ECHOCARDIOGRAM COMPLETE Result Date: 03/07/2024    ECHOCARDIOGRAM REPORT   Patient Name:   LANESHIA GRUBER [facility] Date of Exam: 03/07/2024 Medical Rec #:  [id number]       Height:       61.0 in Accession #:    [id number]      Weight:       143.1 lb Date of Birth:  [birth year]       BSA:          1.638 m Patient Age:    68 years        BP:           104/68 mmHg Patient Gender: F               HR:           99 bpm. Exam Location:  [facility] Procedure: 2D Echo,  Cardiac Doppler and Color Doppler (Both Spectral and Color            Flow Doppler were utilized during procedure). Indications:     CHF--acute systolic I50.21  History:         Patient has no prior history of Echocardiogram examinations.                  CHF, Arrythmias:Atrial Fibrillation; Risk Factors:Hypertension.                  Migraines.  Sonographer:     Broadus Canes Referring Phys:  [id number] DILEEP KUMAR Diagnosing Phys: Lida Reeks Alluri IMPRESSIONS  1. Left ventricular ejection fraction, by estimation, is 35 to 40%. The left ventricle has moderately decreased function. The left ventricle demonstrates global hypokinesis. Left ventricular diastolic parameters are indeterminate.  2. Right ventricular systolic function is moderately reduced. The right ventricular size is mildly enlarged. There is moderately elevated pulmonary artery systolic pressure. The estimated right ventricular systolic pressure is 55.9 mmHg.  3. Left atrial size was severely dilated.  4. Right atrial size was severely dilated.  5. The mitral valve is grossly normal. Severe mitral valve regurgitation.  6. Tricuspid valve regurgitation is severe.  7. The aortic valve was not well visualized. Aortic valve regurgitation is trivial. FINDINGS  Left Ventricle: Left ventricular ejection fraction, by estimation, is 35 to 40%. The left ventricle has moderately decreased function. The left ventricle demonstrates global hypokinesis. The left ventricular internal cavity size was normal in size. There is no left ventricular hypertrophy. Left ventricular diastolic parameters are indeterminate. Right Ventricle: The right ventricular size is mildly enlarged. No increase in right ventricular wall thickness. Right ventricular systolic function is moderately reduced. There is moderately elevated pulmonary artery systolic pressure. The tricuspid regurgitant velocity is 3.46 m/s, and with an assumed right atrial pressure of 8 mmHg, the estimated right ventricular  systolic pressure is 55.9 mmHg. Left Atrium: Left atrial size was severely dilated. Right Atrium: Right atrial size was severely dilated. Pericardium: There is no evidence of pericardial effusion. Mitral Valve: The mitral valve is grossly normal. There is mild thickening of the mitral valve leaflet(s). Severe mitral valve regurgitation. Tricuspid Valve: The tricuspid valve is grossly normal. Tricuspid valve regurgitation is severe. Aortic Valve: The aortic valve was not well visualized. Aortic valve regurgitation is trivial. Aortic valve mean  gradient measures 3.3 mmHg. Aortic valve peak gradient measures 5.6 mmHg. Aortic valve area, by VTI measures 1.67 cm. Pulmonic Valve: The pulmonic valve was not well visualized. Pulmonic valve regurgitation is trivial. Aorta: The aortic root is normal in size and structure. IAS/Shunts: The atrial septum is grossly normal.  LEFT VENTRICLE PLAX 2D LVIDd:         5.30 cm LVIDs:         4.50 cm LV PW:         1.10 cm LV IVS:        0.90 cm LVOT diam:     2.00 cm LV SV:         36 LV SV Index:   22 LVOT Area:     3.14 cm  RIGHT VENTRICLE RV Basal diam:  4.60 cm RV Mid diam:    4.00 cm RV S prime:     6.74 cm/s TAPSE (M-mode): 1.1 cm LEFT ATRIUM             Index        RIGHT ATRIUM           Index LA diam:        5.60 cm 3.42 cm/m   RA Area:     26.50 cm LA Vol (A2C):   64.4 ml 39.31 ml/m  RA Volume:   96.00 ml  58.60 ml/m LA Vol (A4C):   77.8 ml 47.49 ml/m LA Biplane Vol: 71.8 ml 43.83 ml/m  AORTIC VALVE AV Area (Vmax):    1.54 cm AV Area (Vmean):   1.64 cm AV Area (VTI):     1.67 cm AV Vmax:           118.63 cm/s AV Vmean:          81.233 cm/s AV VTI:            0.216 m AV Peak Grad:      5.6 mmHg AV Mean Grad:      3.3 mmHg LVOT Vmax:         58.30 cm/s LVOT Vmean:        42.400 cm/s LVOT VTI:          0.115 m LVOT/AV VTI ratio: 0.53  AORTA Ao Root diam: 2.60 cm MITRAL VALVE                TRICUSPID VALVE MV Area (PHT): 4.36 cm     TR Peak grad:   47.9 mmHg MV Decel  Time: 174 msec     TR Vmax:        346.00 cm/s MV E velocity: 147.00 cm/s                             SHUNTS                             Systemic VTI:  0.12 m                             Systemic Diam: 2.00 cm Joetta Mustache Electronically signed by Joetta Mustache Signature Date/Time: 03/07/2024/5:26:18 PM    Final     Microbiology: Results for orders placed or performed during the hospital encounter of 02/27/24  C Difficile Quick Screen w PCR reflex     Status: Abnormal   Collection Time: 02/27/24  7:34  PM   Specimen: STOOL  Result Value Ref Range Status   C Diff antigen POSITIVE (A) NEGATIVE Final   C Diff toxin POSITIVE (A) NEGATIVE Final   C Diff interpretation Toxin producing C. difficile detected.  Final    Comment: CRITICAL RESULT CALLED TO, READ BACK BY AND VERIFIED WITH: AMANDA FOX RN @2254  02/27/24 ASW Performed at [facility], [address] [place], [place] [address]   Gastrointestinal Panel by PCR , Stool     Status: None   Collection Time: 02/27/24  7:34 PM   Specimen: STOOL  Result Value Ref Range Status   Campylobacter species NOT DETECTED NOT DETECTED Final   Plesimonas shigelloides NOT DETECTED NOT DETECTED Final   Salmonella species NOT DETECTED NOT DETECTED Final   Yersinia enterocolitica NOT DETECTED NOT DETECTED Final   Vibrio species NOT DETECTED NOT DETECTED Final   Vibrio cholerae NOT DETECTED NOT DETECTED Final   Enteroaggregative E coli (EAEC) NOT DETECTED NOT DETECTED Final   Enteropathogenic E coli (EPEC) NOT DETECTED NOT DETECTED Final   Enterotoxigenic E coli (ETEC) NOT DETECTED NOT DETECTED Final   Shiga like toxin producing E coli (STEC) NOT DETECTED NOT DETECTED Final   Shigella/Enteroinvasive E coli (EIEC) NOT DETECTED NOT DETECTED Final   Cryptosporidium NOT DETECTED NOT DETECTED Final   Cyclospora cayetanensis NOT DETECTED NOT DETECTED Final   Entamoeba histolytica NOT DETECTED NOT DETECTED Final   Giardia lamblia NOT DETECTED NOT  DETECTED Final   Adenovirus F40/41 NOT DETECTED NOT DETECTED Final   Astrovirus NOT DETECTED NOT DETECTED Final   Norovirus GI/GII NOT DETECTED NOT DETECTED Final   Rotavirus A NOT DETECTED NOT DETECTED Final   Sapovirus (I, II, IV, and V) NOT DETECTED NOT DETECTED Final    Comment: Performed at [facility], [address] [place], [place] [address]  MRSA Next Gen by PCR, Nasal     Status: None   Collection Time: 02/27/24 10:01 PM   Specimen: Peripheral; Nasal Swab  Result Value Ref Range Status   MRSA by PCR Next Gen NOT DETECTED NOT DETECTED Final    Comment: (NOTE) The GeneXpert MRSA Assay (FDA approved for NASAL specimens only), is one component of a comprehensive MRSA colonization surveillance program. It is not intended to diagnose MRSA infection nor to guide or monitor treatment for MRSA infections. Test performance is not FDA approved in patients less than 36 years old. Performed at [facility], [address] [place], [place] [address]   Culture, blood (Routine X 2) w Reflex to ID Panel     Status: None   Collection Time: 02/28/24  2:17 AM   Specimen: BLOOD LEFT HAND  Result Value Ref Range Status   Specimen Description BLOOD LEFT HAND  Final   Special Requests   Final    BOTTLES DRAWN AEROBIC AND ANAEROBIC Blood Culture results may not be optimal due to an inadequate volume of blood received in culture bottles   Culture   Final    NO GROWTH 5 DAYS Performed at [facility], [address] [place], [place] [address]    Report Status 03/04/2024 FINAL  Final  Culture, blood (Routine X 2) w Reflex to ID Panel     Status: None   Collection Time: 02/28/24  2:17 AM   Specimen: BLOOD LEFT ARM  Result Value Ref Range Status   Specimen Description BLOOD LEFT ARM  Final   Special Requests   Final    BOTTLES DRAWN  AEROBIC AND ANAEROBIC Blood Culture results may not be optimal due to an inadequate volume of blood received in culture bottles    Culture   Final    NO GROWTH 5 DAYS Performed at [facility], [address] [place], [place] [address]    Report Status 03/04/2024 FINAL  Final    Labs: CBC: Recent Labs  Lab 03/26/24 1436 03/28/24 1535 03/29/24 0413 03/30/24 0425  WBC 5.8 6.7 4.5 3.1*  NEUTROABS  --  5.6  --   --   HGB 13.1 13.2 10.2* 11.1*  HCT 39.1 40.4 31.6* 34.1*  MCV 98.2 100.7* 101.9* 101.8*  PLT 235 166 109* 109*   Basic Metabolic Panel: Recent Labs  Lab 03/28/24 1923 03/29/24 0413 03/29/24 1910 03/30/24 0425 03/31/24 0447  NA 125* 126* 128* 133* 133*  K 4.9 4.6 4.6 4.0 4.2  CL 101 104 104 107 110  CO2 13* 12* 13* 15* 13*  GLUCOSE 120* 92 107* 106* 126*  BUN 60* 54* 42* 36* 26*  CREATININE 2.54* 2.12* 1.52* 1.30* 0.96  CALCIUM  9.0 9.2 9.2 9.7 9.8  MG  --   --   --  1.6* 2.2  PHOS  --   --   --  3.2  --    Liver Function Tests: Recent Labs  Lab 03/28/24 1535 03/29/24 0413 03/30/24 0425  AST 51* 39 37  ALT 45* 36 33  ALKPHOS 283* 228* 206*  BILITOT 0.8 0.9 0.7  PROT 9.0* 7.5 7.5  ALBUMIN  5.0 4.1 4.0   CBG: Recent Labs  Lab 03/30/24 1200 03/30/24 1314 03/30/24 1713 03/30/24 2046 03/31/24 0826  GLUCAP 330* 187* 180* 134* 128*    Discharge time spent: greater than 30 minutes.  Signed: Montey Apa, DO [facility] 03/31/2024

## 2024-04-02 ENCOUNTER — Ambulatory Visit
Admission: RE | Admit: 2024-04-02 | Discharge: 2024-04-02 | Disposition: A | Discharge status: Home / Self Care | Source: Ambulatory Visit | Attending: Cardiovascular Disease | Admitting: Cardiovascular Disease

## 2024-04-02 DIAGNOSIS — A0811 Acute gastroenteropathy due to Norwalk agent: Secondary | ICD-10-CM | POA: Diagnosis not present

## 2024-04-02 DIAGNOSIS — N179 Acute kidney failure, unspecified: Secondary | ICD-10-CM | POA: Insufficient documentation

## 2024-04-02 MED ORDER — SODIUM CHLORIDE 0.9 % IV SOLN: INTRAVENOUS | Status: DC

## 2024-04-03 NOTE — Progress Notes (Unsigned)
 [facility] POST-OP OFFICE VISIT  04/04/2024  HPI: Tammy Arias is a 68 y.o. 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  Unfortunately she has been in and out of the ER, and even day hospital for IV fluids.  She has had a couple short hospitalizations of 2 to 3 days for rehydration IV fluids.  She has made a little trial of fiber, but to my knowledge has never been on an antimotility agent.  She takes some liquid IV at home, has attempted drinking Gatorade, but most recently drank some boost supplement and has very liquid ileostomy output, and is apparently not able to keep up with her hydration all needs.  She presents today with a degree of tachycardia, and when she gets dehydrated she also becomes nauseated, and therefore anorexic.  She denies any fevers, chills or abdominal pain or tenderness.  No significant rectal output.  Vital signs: BP 118/84   Pulse (!) 112   Temp 97.7 F (36.5 C) (Oral)   Ht 5\' 1"  (1.549 m)   Wt 111 lb 3.2 oz (50.4 kg)   SpO2 98%   BMI 21.01 kg/m    Physical Exam: Constitutional: She is alert, and appears well. Abdomen: Benign soft nontender, ileostomy intact with well-fitted appliance very liquid output. Skin: Turgor diminished, significant with dehydration.  Assessment/Plan: This is a 68 y.o. female history of total abdominal colectomy with end ileostomy, now with persisting high volume liquid ileostomy output.  No prior history of antimotility use.  Has had her diuretics discontinued.  Patient Active Problem List   Diagnosis Date Noted   Increased serum lipase level 03/29/2024   GERD without esophagitis 03/29/2024   Type 2 diabetes mellitus with chronic kidney disease, without long-term current use of insulin  (HCC) 03/29/2024   Dyslipidemia 03/29/2024   Acute kidney injury superimposed on chronic kidney disease (HCC) 03/28/2024   AKI (acute kidney injury) (HCC) 03/17/2024    Dehydration 03/17/2024   Chronic systolic CHF (congestive heart failure) (HCC) 03/17/2024   HTN (hypertension) 03/17/2024   Hypothyroidism 03/17/2024   Gout 03/17/2024   Hyponatremia 03/17/2024   Elevated lipase 03/17/2024   Hypomagnesemia 03/17/2024   Pancolitis (HCC) 02/28/2024   Clostridium difficile colitis 02/28/2024   Sepsis with acute renal failure and septic shock (HCC) 02/28/2024   Severe sepsis with septic shock (CODE) (HCC) 02/27/2024   Chronic diastolic CHF (congestive heart failure) (HCC) 06/07/2023   Lower GI bleed 06/07/2023   Chronic anticoagulation 06/07/2023   Hypotension 06/07/2023   Cirrhosis and Portal hypertension  on CT(HCC) 06/07/2023   Thrombocytopenia (HCC) 06/06/2023   CAD with hx of CABG 06/06/2023   OSA on CPAP 05/23/2023   S/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, right 04/05/2022   S/P TKR (total knee replacement) 04/05/2022   Colon cancer screening    Iron  deficiency anemia    Gastric AVM    Barrett's esophagus without dysplasia    Angiodysplasia of intestinal tract    Internal hemorrhoids    Melena 12/17/2018   Pulmonary hypertension (HCC) 03/29/2017   Coronary artery disease involving native coronary artery of native heart with unstable angina pectoris (HCC) 03/21/2017   Ischemic cardiomyopathy 03/21/2017   Atrial fibrillation, chronic (HCC) 10/08/2014   Hyperlipidemia 10/08/2014   Hypertension 10/08/2014   Type 2 diabetes mellitus (HCC) 10/08/2014    - Will admit/refer to day hospital for IV fluids, prescription for Imodium 4 mg 4 times daily  as needed.  - Will defer addition of cholestyramine for now.  Discussed that additional fiber may be helpful.  She is to avoid plain water , and avoid simple sugars.  - Will check CMP and renal panel.   Flynn Hylan M.D., FACS 04/04/2024, 10:58 AM

## 2024-04-04 ENCOUNTER — Other Ambulatory Visit: Payer: Self-pay | Admitting: *Deleted

## 2024-04-04 ENCOUNTER — Encounter: Payer: Self-pay | Admitting: Surgery

## 2024-04-04 ENCOUNTER — Ambulatory Visit: Admitting: Surgery

## 2024-04-04 ENCOUNTER — Other Ambulatory Visit: Payer: Self-pay | Admitting: Surgery

## 2024-04-04 ENCOUNTER — Other Ambulatory Visit: Payer: Self-pay

## 2024-04-04 ENCOUNTER — Inpatient Hospital Stay
Admission: EM | Admit: 2024-04-04 | Discharge: 2024-04-07 | DRG: 391 | Disposition: A | Discharge status: Home Health | Attending: Internal Medicine | Admitting: Internal Medicine

## 2024-04-04 ENCOUNTER — Ambulatory Visit
Admission: RE | Admit: 2024-04-04 | Discharge: 2024-04-04 | Disposition: A | Discharge status: Home / Self Care | Source: Ambulatory Visit | Attending: Surgery | Admitting: Surgery

## 2024-04-04 VITALS — BP 124/65 | HR 106 | Temp 97.5°F | Resp 18 | Ht 61.0 in | Wt 110.0 lb

## 2024-04-04 VITALS — BP 118/84 | HR 112 | Temp 97.7°F | Ht 61.0 in | Wt 111.2 lb

## 2024-04-04 DIAGNOSIS — I5022 Chronic systolic (congestive) heart failure: Secondary | ICD-10-CM | POA: Diagnosis present

## 2024-04-04 DIAGNOSIS — G43909 Migraine, unspecified, not intractable, without status migrainosus: Secondary | ICD-10-CM | POA: Diagnosis present

## 2024-04-04 DIAGNOSIS — E871 Hypo-osmolality and hyponatremia: Secondary | ICD-10-CM | POA: Diagnosis present

## 2024-04-04 DIAGNOSIS — Z932 Ileostomy status: Secondary | ICD-10-CM | POA: Insufficient documentation

## 2024-04-04 DIAGNOSIS — I255 Ischemic cardiomyopathy: Secondary | ICD-10-CM | POA: Diagnosis present

## 2024-04-04 DIAGNOSIS — N179 Acute kidney failure, unspecified: Secondary | ICD-10-CM | POA: Diagnosis present

## 2024-04-04 DIAGNOSIS — M810 Age-related osteoporosis without current pathological fracture: Secondary | ICD-10-CM | POA: Diagnosis present

## 2024-04-04 DIAGNOSIS — E8721 Acute metabolic acidosis: Secondary | ICD-10-CM | POA: Diagnosis present

## 2024-04-04 DIAGNOSIS — M109 Gout, unspecified: Secondary | ICD-10-CM | POA: Diagnosis present

## 2024-04-04 DIAGNOSIS — I2510 Atherosclerotic heart disease of native coronary artery without angina pectoris: Secondary | ICD-10-CM | POA: Diagnosis present

## 2024-04-04 DIAGNOSIS — E860 Dehydration: Secondary | ICD-10-CM

## 2024-04-04 DIAGNOSIS — I130 Hypertensive heart and chronic kidney disease with heart failure and stage 1 through stage 4 chronic kidney disease, or unspecified chronic kidney disease: Secondary | ICD-10-CM | POA: Diagnosis present

## 2024-04-04 DIAGNOSIS — Z888 Allergy status to other drugs, medicaments and biological substances status: Secondary | ICD-10-CM

## 2024-04-04 DIAGNOSIS — E875 Hyperkalemia: Secondary | ICD-10-CM | POA: Diagnosis present

## 2024-04-04 DIAGNOSIS — I482 Chronic atrial fibrillation, unspecified: Secondary | ICD-10-CM | POA: Diagnosis present

## 2024-04-04 DIAGNOSIS — Z96651 Presence of right artificial knee joint: Secondary | ICD-10-CM | POA: Diagnosis present

## 2024-04-04 DIAGNOSIS — Z8673 Personal history of transient ischemic attack (TIA), and cerebral infarction without residual deficits: Secondary | ICD-10-CM

## 2024-04-04 DIAGNOSIS — E785 Hyperlipidemia, unspecified: Secondary | ICD-10-CM | POA: Diagnosis present

## 2024-04-04 DIAGNOSIS — N1831 Chronic kidney disease, stage 3a: Secondary | ICD-10-CM | POA: Diagnosis present

## 2024-04-04 DIAGNOSIS — Z79890 Hormone replacement therapy (postmenopausal): Secondary | ICD-10-CM

## 2024-04-04 DIAGNOSIS — A0811 Acute gastroenteropathy due to Norwalk agent: Principal | ICD-10-CM

## 2024-04-04 DIAGNOSIS — Z7901 Long term (current) use of anticoagulants: Secondary | ICD-10-CM

## 2024-04-04 DIAGNOSIS — I272 Pulmonary hypertension, unspecified: Secondary | ICD-10-CM | POA: Diagnosis present

## 2024-04-04 DIAGNOSIS — Z7984 Long term (current) use of oral hypoglycemic drugs: Secondary | ICD-10-CM

## 2024-04-04 DIAGNOSIS — R7989 Other specified abnormal findings of blood chemistry: Secondary | ICD-10-CM

## 2024-04-04 DIAGNOSIS — Z8619 Personal history of other infectious and parasitic diseases: Secondary | ICD-10-CM

## 2024-04-04 DIAGNOSIS — E8722 Chronic metabolic acidosis: Secondary | ICD-10-CM | POA: Insufficient documentation

## 2024-04-04 DIAGNOSIS — H409 Unspecified glaucoma: Secondary | ICD-10-CM

## 2024-04-04 DIAGNOSIS — Z432 Encounter for attention to ileostomy: Secondary | ICD-10-CM | POA: Diagnosis not present

## 2024-04-04 DIAGNOSIS — Z803 Family history of malignant neoplasm of breast: Secondary | ICD-10-CM

## 2024-04-04 DIAGNOSIS — K529 Noninfective gastroenteritis and colitis, unspecified: Secondary | ICD-10-CM

## 2024-04-04 DIAGNOSIS — Z87891 Personal history of nicotine dependence: Secondary | ICD-10-CM

## 2024-04-04 DIAGNOSIS — Z79899 Other long term (current) drug therapy: Secondary | ICD-10-CM

## 2024-04-04 DIAGNOSIS — E861 Hypovolemia: Secondary | ICD-10-CM | POA: Diagnosis present

## 2024-04-04 DIAGNOSIS — Z833 Family history of diabetes mellitus: Secondary | ICD-10-CM

## 2024-04-04 DIAGNOSIS — M419 Scoliosis, unspecified: Secondary | ICD-10-CM | POA: Diagnosis present

## 2024-04-04 DIAGNOSIS — E1122 Type 2 diabetes mellitus with diabetic chronic kidney disease: Secondary | ICD-10-CM | POA: Diagnosis present

## 2024-04-04 DIAGNOSIS — E039 Hypothyroidism, unspecified: Secondary | ICD-10-CM | POA: Diagnosis present

## 2024-04-04 DIAGNOSIS — Z9049 Acquired absence of other specified parts of digestive tract: Secondary | ICD-10-CM

## 2024-04-04 DIAGNOSIS — E876 Hypokalemia: Secondary | ICD-10-CM

## 2024-04-04 DIAGNOSIS — E872 Acidosis, unspecified: Secondary | ICD-10-CM

## 2024-04-04 DIAGNOSIS — Z8249 Family history of ischemic heart disease and other diseases of the circulatory system: Secondary | ICD-10-CM

## 2024-04-04 DIAGNOSIS — N170 Acute kidney failure with tubular necrosis: Secondary | ICD-10-CM | POA: Diagnosis present

## 2024-04-04 DIAGNOSIS — I9589 Other hypotension: Secondary | ICD-10-CM | POA: Diagnosis present

## 2024-04-04 DIAGNOSIS — K31819 Angiodysplasia of stomach and duodenum without bleeding: Secondary | ICD-10-CM | POA: Diagnosis present

## 2024-04-04 DIAGNOSIS — K219 Gastro-esophageal reflux disease without esophagitis: Secondary | ICD-10-CM | POA: Diagnosis present

## 2024-04-04 LAB — CBC WITH DIFFERENTIAL/PLATELET
Abs Immature Granulocytes: 0.07 10*3/uL (ref 0.00–0.07)
Basophils Absolute: 0 10*3/uL (ref 0.0–0.1)
Basophils Relative: 0 %
Eosinophils Absolute: 0.8 10*3/uL — ABNORMAL HIGH (ref 0.0–0.5)
Eosinophils Relative: 8 %
HCT: 36.2 % (ref 36.0–46.0)
Hemoglobin: 11.8 g/dL — ABNORMAL LOW (ref 12.0–15.0)
Immature Granulocytes: 1 %
Lymphocytes Relative: 7 %
Lymphs Abs: 0.6 10*3/uL — ABNORMAL LOW (ref 0.7–4.0)
MCH: 33.2 pg (ref 26.0–34.0)
MCHC: 32.6 g/dL (ref 30.0–36.0)
MCV: 102 fL — ABNORMAL HIGH (ref 80.0–100.0)
Monocytes Absolute: 0.5 10*3/uL (ref 0.1–1.0)
Monocytes Relative: 6 %
Neutro Abs: 7.3 10*3/uL (ref 1.7–7.7)
Neutrophils Relative %: 78 %
Platelets: 98 10*3/uL — ABNORMAL LOW (ref 150–400)
RBC: 3.55 MIL/uL — ABNORMAL LOW (ref 3.87–5.11)
RDW: 18.4 % — ABNORMAL HIGH (ref 11.5–15.5)
WBC: 9.3 10*3/uL (ref 4.0–10.5)
nRBC: 0 % (ref 0.0–0.2)

## 2024-04-04 LAB — BASIC METABOLIC PANEL WITH GFR
Anion gap: 13 (ref 5–15)
Anion gap: 8 (ref 5–15)
BUN: 40 mg/dL — ABNORMAL HIGH (ref 8–23)
BUN: 38 mg/dL — ABNORMAL HIGH (ref 8–23)
CO2: 13 mmol/L — ABNORMAL LOW (ref 22–32)
CO2: 14 mmol/L — ABNORMAL LOW (ref 22–32)
Calcium: 9.5 mg/dL (ref 8.9–10.3)
Calcium: 9.2 mg/dL (ref 8.9–10.3)
Chloride: 102 mmol/L (ref 98–111)
Chloride: 106 mmol/L (ref 98–111)
Creatinine, Ser: 2.11 mg/dL — ABNORMAL HIGH (ref 0.44–1.00)
Creatinine, Ser: 1.73 mg/dL — ABNORMAL HIGH (ref 0.44–1.00)
GFR, Estimated: 25 mL/min — ABNORMAL LOW (ref >60)
GFR, Estimated: 32 mL/min — ABNORMAL LOW (ref >60)
Glucose, Bld: 100 mg/dL — ABNORMAL HIGH (ref 70–99)
Glucose, Bld: 149 mg/dL — ABNORMAL HIGH (ref 70–99)
Potassium: 5.6 mmol/L — ABNORMAL HIGH (ref 3.5–5.1)
Potassium: 4.6 mmol/L (ref 3.5–5.1)
Sodium: 128 mmol/L — ABNORMAL LOW (ref 135–145)
Sodium: 128 mmol/L — ABNORMAL LOW (ref 135–145)

## 2024-04-04 LAB — COMPREHENSIVE METABOLIC PANEL WITH GFR
ALT: 58 U/L — ABNORMAL HIGH (ref 0–44)
AST: 51 U/L — ABNORMAL HIGH (ref 15–41)
Albumin: 4.4 g/dL (ref 3.5–5.0)
Alkaline Phosphatase: 275 U/L — ABNORMAL HIGH (ref 38–126)
Anion gap: 12 (ref 5–15)
BUN: 38 mg/dL — ABNORMAL HIGH (ref 8–23)
CO2: 15 mmol/L — ABNORMAL LOW (ref 22–32)
Calcium: 10.7 mg/dL — ABNORMAL HIGH (ref 8.9–10.3)
Chloride: 100 mmol/L (ref 98–111)
Creatinine, Ser: 2.17 mg/dL — ABNORMAL HIGH (ref 0.44–1.00)
GFR, Estimated: 24 mL/min — ABNORMAL LOW (ref >60)
Glucose, Bld: 128 mg/dL — ABNORMAL HIGH (ref 70–99)
Potassium: 5.7 mmol/L — ABNORMAL HIGH (ref 3.5–5.1)
Sodium: 127 mmol/L — ABNORMAL LOW (ref 135–145)
Total Bilirubin: 0.8 mg/dL (ref 0.0–1.2)
Total Protein: 8.2 g/dL — ABNORMAL HIGH (ref 6.5–8.1)

## 2024-04-04 LAB — BILIRUBIN, DIRECT: Bilirubin, Direct: 0.2 mg/dL (ref 0.0–0.2)

## 2024-04-04 LAB — OSMOLALITY: Osmolality: 285 mosm/kg (ref 275–295)

## 2024-04-04 LAB — CBG MONITORING, ED: Glucose-Capillary: 150 mg/dL — ABNORMAL HIGH (ref 70–99)

## 2024-04-04 MED ORDER — SODIUM ZIRCONIUM CYCLOSILICATE 10 G PO PACK
10.0000 g | PACK | ORAL | Status: AC
Start: 2024-04-04 — End: 2024-04-04
  Administered 2024-04-04: 10 g via ORAL
  Filled 2024-04-04: qty 1

## 2024-04-04 MED ORDER — PANTOPRAZOLE SODIUM 40 MG PO TBEC
40.0000 mg | DELAYED_RELEASE_TABLET | Freq: Every day | ORAL | Status: DC
  Administered 2024-04-05 – 2024-04-07 (×3): 40 mg via ORAL
  Filled 2024-04-04 (×3): qty 1

## 2024-04-04 MED ORDER — HEPARIN SODIUM (PORCINE) 5000 UNIT/ML IJ SOLN: 5000.0000 [IU] | Freq: Three times a day (TID) | INTRAMUSCULAR | Status: DC

## 2024-04-04 MED ORDER — SODIUM BICARBONATE 650 MG PO TABS
650.0000 mg | ORAL_TABLET | Freq: Three times a day (TID) | ORAL | Status: DC
  Administered 2024-04-04 – 2024-04-07 (×8): 650 mg via ORAL
  Filled 2024-04-04 (×8): qty 1

## 2024-04-04 MED ORDER — ALLOPURINOL 100 MG PO TABS
300.0000 mg | ORAL_TABLET | Freq: Every day | ORAL | Status: DC
  Administered 2024-04-05 – 2024-04-07 (×3): 300 mg via ORAL
  Filled 2024-04-04: qty 3
  Filled 2024-04-04: qty 1
  Filled 2024-04-04: qty 3

## 2024-04-04 MED ORDER — SODIUM CHLORIDE 0.9 % IV BOLUS
1000.0000 mL | Freq: Once | INTRAVENOUS | Status: AC
  Administered 2024-04-04: 1000 mL via INTRAVENOUS

## 2024-04-04 MED ORDER — ACETAMINOPHEN 325 MG PO TABS
650.0000 mg | ORAL_TABLET | Freq: Four times a day (QID) | ORAL | Status: DC | PRN
  Administered 2024-04-07: 650 mg via ORAL
  Filled 2024-04-04: qty 2

## 2024-04-04 MED ORDER — SODIUM CHLORIDE 0.9 % IV BOLUS
500.0000 mL | Freq: Once | INTRAVENOUS | Status: AC
  Administered 2024-04-04: 500 mL via INTRAVENOUS

## 2024-04-04 MED ORDER — INSULIN ASPART 100 UNIT/ML IJ SOLN
0.0000 [IU] | Freq: Three times a day (TID) | INTRAMUSCULAR | Status: DC
  Administered 2024-04-05: 2 [IU] via SUBCUTANEOUS
  Administered 2024-04-05: 3 [IU] via SUBCUTANEOUS
  Administered 2024-04-05 – 2024-04-06 (×2): 1 [IU] via SUBCUTANEOUS
  Administered 2024-04-06 (×2): 2 [IU] via SUBCUTANEOUS
  Filled 2024-04-04 (×6): qty 1

## 2024-04-04 MED ORDER — SODIUM BICARBONATE 8.4 % IV SOLN
INTRAVENOUS | Status: DC
  Filled 2024-04-04 (×3): qty 1000
  Filled 2024-04-04: qty 150
  Filled 2024-04-04: qty 1000
  Filled 2024-04-04: qty 150

## 2024-04-04 MED ORDER — APIXABAN 2.5 MG PO TABS
2.5000 mg | ORAL_TABLET | Freq: Two times a day (BID) | ORAL | Status: DC
  Administered 2024-04-04 – 2024-04-07 (×6): 2.5 mg via ORAL
  Filled 2024-04-04 (×6): qty 1

## 2024-04-04 MED ORDER — SODIUM CHLORIDE 0.9% FLUSH
3.0000 mL | Freq: Two times a day (BID) | INTRAVENOUS | Status: DC
Start: 2024-04-04 — End: 2024-04-07
  Administered 2024-04-04 – 2024-04-07 (×4): 3 mL via INTRAVENOUS

## 2024-04-04 MED ORDER — POLYETHYLENE GLYCOL 3350 17 G PO PACK: 17.0000 g | PACK | Freq: Every day | ORAL | Status: DC | PRN

## 2024-04-04 MED ORDER — INSULIN ASPART 100 UNIT/ML IJ SOLN
0.0000 [IU] | Freq: Every day | INTRAMUSCULAR | Status: DC
  Administered 2024-04-06: 3 [IU] via SUBCUTANEOUS
  Filled 2024-04-04: qty 1

## 2024-04-04 MED ORDER — LATANOPROST 0.005 % OP SOLN
1.0000 [drp] | Freq: Every day | OPHTHALMIC | Status: DC
  Administered 2024-04-05 – 2024-04-06 (×2): 1 [drp] via OPHTHALMIC
  Filled 2024-04-04 (×2): qty 2.5

## 2024-04-04 MED ORDER — ALBUTEROL SULFATE (2.5 MG/3ML) 0.083% IN NEBU: 3.0000 mL | INHALATION_SOLUTION | Freq: Four times a day (QID) | RESPIRATORY_TRACT | Status: DC | PRN

## 2024-04-04 MED ORDER — LOPERAMIDE HCL 2 MG PO TABS: 4.0000 mg | ORAL_TABLET | Freq: Four times a day (QID) | ORAL | 2 refills | Status: DC | PRN

## 2024-04-04 MED ORDER — LEVOTHYROXINE SODIUM 50 MCG PO TABS
50.0000 ug | ORAL_TABLET | Freq: Every day | ORAL | Status: DC
  Administered 2024-04-05 – 2024-04-07 (×3): 50 ug via ORAL
  Filled 2024-04-04 (×3): qty 1

## 2024-04-04 MED ORDER — MIDODRINE HCL 5 MG PO TABS
5.0000 mg | ORAL_TABLET | Freq: Three times a day (TID) | ORAL | Status: DC
  Administered 2024-04-05 – 2024-04-07 (×7): 5 mg via ORAL
  Filled 2024-04-04 (×7): qty 1

## 2024-04-04 MED ORDER — ROSUVASTATIN CALCIUM 10 MG PO TABS
5.0000 mg | ORAL_TABLET | Freq: Every day | ORAL | Status: DC
  Administered 2024-04-05 – 2024-04-07 (×3): 5 mg via ORAL
  Filled 2024-04-04 (×3): qty 1

## 2024-04-04 MED ORDER — SODIUM CHLORIDE 0.9 % IV SOLN: INTRAVENOUS | Status: DC

## 2024-04-04 MED ORDER — DEXTROSE-SODIUM CHLORIDE 5-0.9 % IV SOLN: INTRAVENOUS | Status: DC

## 2024-04-04 MED ORDER — ENOXAPARIN SODIUM 40 MG/0.4ML IJ SOSY: 40.0000 mg | PREFILLED_SYRINGE | INTRAMUSCULAR | Status: DC

## 2024-04-04 MED ORDER — ACETAMINOPHEN 650 MG RE SUPP: 650.0000 mg | Freq: Four times a day (QID) | RECTAL | Status: DC | PRN

## 2024-04-04 NOTE — Assessment & Plan Note (Addendum)
 04-05-2024 Continue with Xalatan   04-07-2024 stable.

## 2024-04-04 NOTE — Assessment & Plan Note (Addendum)
 crhonic, continue with pantoprazole 

## 2024-04-04 NOTE — Assessment & Plan Note (Addendum)
 On admission. Given AKI, hold metformin .  Treat with insulin  sliding scale sensitive  04-05-2024 CBG stable  04-06-2024 stable.   04-07-2024 stable.

## 2024-04-04 NOTE — Assessment & Plan Note (Signed)
 Continue with Synthroid 

## 2024-04-04 NOTE — Assessment & Plan Note (Addendum)
 On admission. Please minimal, appears to be chronic on outpatient lab review.  Outpatient workup  04-05-2024 stable.  04-07-2024 stable.

## 2024-04-04 NOTE — Assessment & Plan Note (Signed)
 Not in flare, continue with allopurinol 

## 2024-04-04 NOTE — Assessment & Plan Note (Addendum)
 04-05-2024 continue with Eliquis .  04-06-2024 stable. continue with Eliquis .  04-07-2024 stable. Continue eliquis 

## 2024-04-04 NOTE — Assessment & Plan Note (Addendum)
 On admission. At this time patient is pending urine studies including urine sodium, however given the entirety of the clinical picture this is most consistent with prerenal AKI with possibility of ATN on top.  This is complicated by moderate asymptomatic hyponatremia, mild hyperkalemia without peaked T waves on the EKG. Treating with saline bolus in the ER.  Continue with sodium bicarbonate  infusion IV got Lokelma  once.  Will trend laboratory markers. And urine output.. hold spironolactone  and lasix .  04-05-2024 Scr 1.29 today. Scr was 1.73 yesterday. AKI improving with IVF. AKI due to gastroenteritis caused by Norovirus.

## 2024-04-04 NOTE — Patient Instructions (Signed)
 We have you schedule for IV Fluids at [facility] Same day at 2pm  We also order you some labs that can be done at [facility]  We sent in a prescription for imodium to your pharmacy   Please call the office if you have any questions or concerns

## 2024-04-04 NOTE — Assessment & Plan Note (Addendum)
 On admission. At this time patient is pending urine studies including urine sodium, however given the entirety of the clinical picture this is most consistent with prerenal AKI with possibility of ATN on top.  This is complicated by moderate asymptomatic hyponatremia, mild hyperkalemia without peaked T waves on the EKG. Treating with saline bolus in the ER.  Continue with sodium bicarbonate  infusion IV got Lokelma  once.  Will trend laboratory markers. And urine output.. hold spironolactone  and lasix .  04-05-2024 Scr 1.29 today. Scr was 1.73 yesterday. AKI improving with IVF. AKI due to gastroenteritis caused by Norovirus.  04-06-2024 resolved. Scr normal now at 0.73. stop IV bicarb gtts. Change to IV LR. Monitoring ileostomy output. Potential for DC to home tomorrow.  04-07-2024 resolved. Scr 0.68. Stable for DC to home.

## 2024-04-04 NOTE — H&P (Signed)
 History and Physical    Patient: Tammy Arias:[id number] DOB: 1956/10/31 DOA: 04/04/2024 DOS: the patient was seen and examined on 04/04/2024 PCP: Lyle San, MD  Patient coming from: Home>sent from surgical offices.  Chief Complaint:  Chief Complaint  Patient presents with   Weakness   HPI: Tammy Arias is a 68 y.o. female with medical history significant of CHF, osteoarthritis, atrial fibrillation, coronary artery disease, HFrEF, GERD, hypertension and hypothyroidism as well as type 2 diabetes mellitus, s/p exploratory laparotomy, total colectomy, and end ileostomy creation for septic shock secondary to C diff colitis in April 2025 . [facility] last hospitalist just 6 days ago for  "acute onset of diminished appetite and feeling dehydrated as well as vomiting today. " DC 03/31/2024   Patient reports being in her usual state of health until about 2 days ago when she reports a new onset of marked green-colored liquid stool output from the ileostomy requiring emptying the bag several times a day.  Not associated with any melena or bleeding.  Not associated with any vomiting or abdominal pain or fevers.  However it was associated with marked reduced appetite and some nausea.  Patient became generally weak and fatigued.  Presented to surgical clinic earlier today where she had labs done.  Notable for moderate hyponatremia hyperkalemia acidosis as well as AKI.  Patient is sent to the ER medical evaluation is sought. [facility] remains vitally stable (she has chronic hypotension for wich she take midodrine )     Review of Systems: As mentioned in the history of present illness. All other systems reviewed and are negative. Past Medical History:  Diagnosis Date   AC (acromioclavicular) joint bone spurs, right    Anemia    Arthritis    Atrial fibrillation (HCC)    a.) CHA2DS2-VASc = 7 (age, sex, HFrEF, HTN, TIA x 2, T2DM). b.) rate/rhythm controlled on oral carvedilol ; chronically  anticoagulated with warfarin.   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 b.) R/LHC 03/29/2017: small RCA with occluded SVG; no significant Dz. Chronically occluded LAD with patent SVG to D1/LAD. Insignificant Dz in LCx. LM normal.   Cortical cataract    DOE (dyspnea on exertion)    DOE (dyspnea on exertion)    GERD (gastroesophageal reflux disease)    HFrEF (heart failure with reduced ejection fraction) (HCC)    a.) TTE 10/27/2014: EF 40%; glob HK; mild BAE; triv AR, mild MR/PR, mod TR.  b.) TTE 03/15/2017: EF 30%; glob HK; mod BAE; mod pHTN (RVSP 54.8 mmHg); mild PR, mod MR; sev TR.  c.) R/LHC 03/29/2017: EF 30-35%. d.)TTE 10/25/2018: EF 35%; mod BAE; mod BVE; glob HK; triv PR, mod MR, sev TR; RVSP 57.7 mmHg; G2DD.   History of shingles 2004   Hyperlipidemia    Hypertension    Hypothyroidism    IBS (irritable bowel syndrome)    Ischemic cardiomyopathy    a.) TTE 10/27/2014: EF 40%. b.) TTE 03/15/2017: EF 30%. c.) R/LHC 04/01/2017: EF 30-35%. d.) TTE 10/25/2018: EF 35%   Lower GI bleed 06/07/2023   Lumbar scoliosis    Lumbar spinal stenosis    Melena 12/17/2018   Migraines    Osteoporosis    Pancolitis (HCC) 02/28/2024   Pulmonary HTN (HCC)  a.) TTE 03/15/2017: EF 30-35%; RVSP 54.8 mmHg. 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   Sleep apnea    T2DM (type 2 diabetes mellitus) (HCC)    TIA (transient ischemic attack) 05/29/2016   Vitamin B 12 deficiency    Past Surgical History:  Procedure Laterality Date   CARDIAC CATHETERIZATION     CATARACT EXTRACTION W/ INTRAOCULAR LENS IMPLANT Bilateral    Cataract Extraction with IOL   COLECTOMY  02/28/2024   Procedure: COLECTOMY, TOTAL;   Surgeon: Flynn Hylan, MD;  Location: [facility] ORS;  Service: General;;   COLONOSCOPY N/A 12/19/2018   Procedure: COLONOSCOPY;  Surgeon: Irby Mannan, MD;  Location: [facility] ENDOSCOPY;  Service: Endoscopy;  Laterality: N/A;   COLONOSCOPY WITH PROPOFOL  N/A 06/17/2020   Procedure: COLONOSCOPY WITH PROPOFOL ;  Surgeon: Irby Mannan, MD;  Location: [facility] ENDOSCOPY;  Service: Endoscopy;  Laterality: N/A;   COLONOSCOPY WITH PROPOFOL  N/A 03/30/2023   Procedure: COLONOSCOPY WITH PROPOFOL ;  Surgeon: Quintin Buckle, DO;  [facility] ENDOSCOPY;  Service: Endoscopy;  Laterality: N/A;   COR TRIATRIATUM REPAIR N/A 01/28/2004   CORONARY ARTERY BYPASS GRAFT N/A 01/28/2004   3v CABG   ESOPHAGOGASTRODUODENOSCOPY N/A 12/19/2018   Procedure: ESOPHAGOGASTRODUODENOSCOPY (EGD);  Surgeon: Irby Mannan, MD;  Location: [facility] ENDOSCOPY;  Service: Endoscopy;  Laterality: N/A;   ESOPHAGOGASTRODUODENOSCOPY (EGD) WITH PROPOFOL  N/A 06/17/2020   Procedure: ESOPHAGOGASTRODUODENOSCOPY (EGD) WITH PROPOFOL ;  Surgeon: Irby Mannan, MD;  Location: [facility] ENDOSCOPY;  Service: Endoscopy;  Laterality: N/A;   ESOPHAGOGASTRODUODENOSCOPY (EGD) WITH PROPOFOL  N/A 03/30/2023   Procedure: ESOPHAGOGASTRODUODENOSCOPY (EGD) WITH PROPOFOL ;  Surgeon: Quintin Buckle, DO;  Location: [facility] ENDOSCOPY;  Service: Endoscopy;  Laterality: N/A;   LAPAROTOMY N/A 02/28/2024   Procedure: LAPAROTOMY, EXPLORATORY;  Surgeon: Flynn Hylan, MD;  Location: [facility] ORS;  Service: General;  Laterality: N/A;   RIGHT/LEFT HEART CATH AND CORONARY ANGIOGRAPHY Bilateral 03/29/2017   Procedure: Right/Left Heart Cath and Coronary Angiography;  Surgeon: Ronney Cola, MD;  Location: [facility] INVASIVE CV LAB;  Service: Cardiovascular;  Laterality: Bilateral;   TOTAL KNEE ARTHROPLASTY Right 04/05/2022   Procedure: TOTAL KNEE ARTHROPLASTY;  Surgeon: Molli Angelucci, MD;  Location: [facility] ORS;  Service: Orthopedics;  Laterality: Right;   TUBAL LIGATION      VENTRAL HERNIA REPAIR N/A 04/21/2017   Procedure: HERNIA REPAIR VENTRAL ADULT;  Surgeon: Benancio Bracket, MD;  Location: [facility] ORS;  Service: General;  Laterality: N/A;   Social History:  reports that she quit smoking about 25 years ago. Her smoking use included cigarettes. She has never used smokeless tobacco. She reports that she does not drink alcohol and does not use drugs.  Allergies  Allergen Reactions   Vioxx [Rofecoxib] Swelling    Family History  Problem Relation Age of Onset   Valvular heart disease Mother    Diabetes Father    Cancer Sister        lung   Cancer Sister        cervical   Breast cancer Cousin     Prior to Admission medications   Medication Sig Start Date End Date Taking? Authorizing Provider  albuterol  (PROVENTIL  HFA;VENTOLIN  HFA) 108 (90 Base) MCG/ACT inhaler Inhale 2 puffs into the lungs every 6 (six) hours as needed for wheezing or shortness of breath.   Yes [provider]  allopurinol  (ZYLOPRIM ) 300 MG tablet Take 300 mg by  mouth daily.   Yes [provider]  apixaban  (ELIQUIS ) 2.5 MG TABS tablet Take 1 tablet (2.5 mg total) by mouth 2 (two) times daily. 03/31/24  Yes Darus Engels A, DO  ascorbic acid  (VITAMIN C ) 500 MG tablet Take 1 tablet (500 mg total) by mouth daily. 03/12/24 06/10/24 Yes Althia Atlas, MD  iron  polysaccharides (NIFEREX) 150 MG capsule Take 1 capsule (150 mg total) by mouth daily. 03/12/24 06/10/24 Yes Althia Atlas, MD  latanoprost  (XALATAN ) 0.005 % ophthalmic solution Place 1 drop into both eyes at bedtime.   Yes [provider]  levothyroxine  (SYNTHROID ) 50 MCG tablet Take 50 mcg by mouth daily before breakfast.   Yes [provider]  loperamide (IMODIUM A-D) 2 MG tablet Take 2 tablets (4 mg total) by mouth 4 (four) times daily as needed for diarrhea or loose stools. 04/04/24  Yes Flynn Hylan, MD  metFORMIN  (GLUCOPHAGE ) 1000 MG tablet Take 1,000 mg by mouth 2 (two) times daily with a meal.    Yes [provider]  midodrine  (PROAMATINE ) 10 MG tablet Take 1 tablet (10 mg total) by mouth 3 (three) times daily with meals. Patient taking differently: Take 5 mg by mouth 3 (three) times daily with meals. 03/12/24 06/10/24 Yes Althia Atlas, MD  pantoprazole  (PROTONIX ) 40 MG tablet Take 40 mg by mouth daily. 01/28/21  Yes [provider]  rosuvastatin  (CRESTOR ) 5 MG tablet Take 5 mg by mouth daily. 02/20/24 02/19/25 Yes [provider]  sodium bicarbonate  650 MG tablet Take 1 tablet (650 mg total) by mouth 3 (three) times daily. 03/31/24  Yes Darus Engels A, DO  spironolactone  (ALDACTONE ) 25 MG tablet Take 0.5 tablets (12.5 mg total) by mouth daily. 03/31/24  Yes Darus Engels A, DO  colchicine  0.6 MG tablet Take 0.6-1.8 mg by mouth as needed (take 1.2 mg (2 tablets) at onset of gout flare, Take one additional tablet one hour later if symptoms persist. Maximum of 3 tablets per day). Patient not taking: Reported on 04/04/2024    [provider]  JARDIANCE  10 MG TABS tablet Take 10 mg by mouth daily. Patient not taking: Reported on 04/04/2024    [provider]  potassium chloride  (KLOR-CON ) 10 MEQ tablet Take 1 tablet (10 mEq total) by mouth daily as needed (if you need to take torsemide , also take potassium). Patient not taking: Reported on 04/04/2024 03/31/24   Darus Engels A, DO  torsemide  (DEMADEX ) 20 MG tablet Take 1 tablet (20 mg total) by mouth daily as needed (for weight gain or swelling). Patient not taking: Reported on 04/04/2024 03/31/24   Montey Apa, DO    Physical Exam: Vitals:   04/04/24 1830 04/04/24 1929 04/04/24 2052 04/04/24 2101  BP: 99/61  (!) 96/58   Pulse: 94  98   Resp: 19  16   Temp:    (!) 97.4 F (36.3 C)  TempSrc:    Oral  SpO2: 96% 96% 100%   Weight:      Height:       General: Thin lady appears to be in no distress, fully awake and alert and gives a coherent account of her symptoms Respiratory exam: Bilateral air  entry vesicular Cardiovascular exam S1-S2 normal Abdomen all quadrants are soft nontender ileostomy in situ with liquid green stool in the bag no guarding or rebound Extremities warm without edema no focal deficit. Data Reviewed:  Labs on Admission:  Results for orders placed or performed during the hospital encounter of 04/04/24 (from the  past 24 hours)  Basic metabolic panel     Status: Abnormal   Collection Time: 04/04/24  8:17 PM  Result Value Ref Range   Sodium 128 (L) 135 - 145 mmol/L   Potassium 5.6 (H) 3.5 - 5.1 mmol/L   Chloride 102 98 - 111 mmol/L   CO2 13 (L) 22 - 32 mmol/L   Glucose, Bld 100 (H) 70 - 99 mg/dL   BUN 40 (H) 8 - 23 mg/dL   Creatinine, Ser 5.40 (H) 0.44 - 1.00 mg/dL   Calcium  9.5 8.9 - 10.3 mg/dL   GFR, Estimated 25 (L) >60 mL/min   Anion gap 13 5 - 15  CBC with Differential/Platelet     Status: Abnormal   Collection Time: 04/04/24  8:45 PM  Result Value Ref Range   WBC 9.3 4.0 - 10.5 K/uL   RBC 3.55 (L) 3.87 - 5.11 MIL/uL   Hemoglobin 11.8 (L) 12.0 - 15.0 g/dL   HCT 98.1 19.1 - 47.8 %   MCV 102.0 (H) 80.0 - 100.0 fL   MCH 33.2 26.0 - 34.0 pg   MCHC 32.6 30.0 - 36.0 g/dL   RDW 29.5 (H) 62.1 - 30.8 %   Platelets 98 (L) 150 - 400 K/uL   nRBC 0.0 0.0 - 0.2 %   Neutrophils Relative % 78 %   Neutro Abs 7.3 1.7 - 7.7 K/uL   Lymphocytes Relative 7 %   Lymphs Abs 0.6 (L) 0.7 - 4.0 K/uL   Monocytes Relative 6 %   Monocytes Absolute 0.5 0.1 - 1.0 K/uL   Eosinophils Relative 8 %   Eosinophils Absolute 0.8 (H) 0.0 - 0.5 K/uL   Basophils Relative 0 %   Basophils Absolute 0.0 0.0 - 0.1 K/uL   Immature Granulocytes 1 %   Abs Immature Granulocytes 0.07 0.00 - 0.07 K/uL   Basic Metabolic Panel: Recent Labs  Lab 03/29/24 1910 03/30/24 0425 03/31/24 0447 04/04/24 1404 04/04/24 2017  NA 128* 133* 133* 127* 128*  K 4.6 4.0 4.2 5.7* 5.6*  CL 104 107 110 100 102  CO2 13* 15* 13* 15* 13*  GLUCOSE 107* 106* 126* 128* 100*  BUN 42* 36* 26* 38* 40*   CREATININE 1.52* 1.30* 0.96 2.17* 2.11*  CALCIUM  9.2 9.7 9.8 10.7* 9.5  MG  --  1.6* 2.2  --   --   PHOS  --  3.2  --   --   --    Liver Function Tests: Recent Labs  Lab 03/29/24 0413 03/30/24 0425 04/04/24 1404  AST 39 37 51*  ALT 36 33 58*  ALKPHOS 228* 206* 275*  BILITOT 0.9 0.7 0.8  PROT 7.5 7.5 8.2*  ALBUMIN  4.1 4.0 4.4   Recent Labs  Lab 03/29/24 0413 03/30/24 0425 03/31/24 0447  LIPASE 194* 272* 273*   No results for input(s): "AMMONIA" in the last 168 hours. CBC: Recent Labs  Lab 03/29/24 0413 03/30/24 0425 04/04/24 2045  WBC 4.5 3.1* 9.3  NEUTROABS  --   --  7.3  HGB 10.2* 11.1* 11.8*  HCT 31.6* 34.1* 36.2  MCV 101.9* 101.8* 102.0*  PLT 109* 109* 98*   Cardiac Enzymes: No results for input(s): "CKTOTAL", "CKMB", "CKMBINDEX", "TROPONINIHS" in the last 168 hours.  BNP (last 3 results) No results for input(s): "PROBNP" in the last 8760 hours. CBG: Recent Labs  Lab 03/30/24 1314 03/30/24 1713 03/30/24 2046 03/31/24 0826 03/31/24 1146  GLUCAP 187* 180* 134* 128* 209*    Radiological Exams on Admission:  No results found.  chest X-ray  EKG: Independently reviewed.  A-fib, no.  Peaked T waves  No intake/output data recorded. No intake/output data recorded.        Assessment and Plan: * AKI (acute kidney injury) (HCC) At this time patient is pending urine studies including urine sodium, however given the entirety of the clinical picture this is most consistent with prerenal AKI with possibility of ATN on top.  This is complicated by moderate asymptomatic hyponatremia, mild hyperkalemia without peaked T waves on the EKG  Treating with saline bolus in the ER.  Continue with sodium bicarbonate  infusion IV got Lokelma  once.  Will trend laboratory markers. And urine output.. hold spironolactone  and lasix .  Hypothyroidism Continue with Synthroid   Atrial fibrillation, chronic (HCC) C.w apixaban   Gout Not in flare, continue with  allopurinol   Glaucoma Continue with Xalatan   Elevated LFTs Please minimal, appears to be chronic on outpatient lab review.  Outpatient workup  Enteritis See HPI for clinical course, check C. difficile and stool pathogen panel.  At this time do not see indication for antibiotic therapy given no fever no leukocytosis and no toxic symptoms  Metabolic acidosis Nonanion gap likely due to enteric losses.  Review of labs shows that this is chronic, uncontrolled.  Will add sodium bicarbonate  infusion.  To oral sodium bicarbonate  therapy  Type 2 diabetes mellitus with chronic kidney disease, without long-term current use of insulin  (HCC) Given AKI, hold metformin .  Treat with insulin  sliding scale sensitive  Gastric AVM crhonic, continue with pantoprazole       Advance Care Planning:   Code Status: Full Code   Consults: non  Family Communication: per patient.  Severity of Illness: The appropriate patient status for this patient is INPATIENT. Inpatient status is judged to be reasonable and necessary in order to provide the required intensity of service to ensure the patient's safety. The patient's presenting symptoms, physical exam findings, and initial radiographic and laboratory data in the context of their chronic comorbidities is felt to place them at high risk for further clinical deterioration. Furthermore, it is not anticipated that the patient will be medically stable for discharge from the hospital within 2 midnights of admission.   * I certify that at the point of admission it is my clinical judgment that the patient will require inpatient hospital care spanning beyond 2 midnights from the point of admission due to high intensity of service, high risk for further deterioration and high frequency of surveillance required.*  Author: Bennie Brave, MD 04/04/2024 9:46 PM  For on call review [email or web address].

## 2024-04-04 NOTE — Progress Notes (Signed)
 Pt arrived to SDS to receive IV bolus x2, pt tolerated well. Pt c/o minimal nausea. Orthostatic signs taken and recorded. Labs drawn. Dr Ofilia Benton notified of labs and wanted pt to be admitted to floor but no beds are available. Potassium 5.7, pt taken to ED report given to triage RN. 22 g IV in right arm, flushed and saline locked.Dr Ofilia Benton notified hospitalist. .

## 2024-04-04 NOTE — Assessment & Plan Note (Addendum)
 On admission. Nonanion gap likely due to enteric losses.  Review of labs shows that this is chronic, uncontrolled.  Will add sodium bicarbonate  infusion.  To oral sodium bicarbonate  therapy  04-05-2024 pt has acute on chronic metabolic acidosis. Chronic acidosis due to ongoing bicarbonate losses from her ileostomy. Now exacerbated due to more output from ileostom from Novorovirus. Continue with IV bicarb and po bicarb.  04-06-2024 Serum bicarb up to 35 on IV bicarb gtts. Bicarb gtts stopped. Continue po bicarb  04-07-2024 resolved. Serum bicarb up to 34. Will have pt hold po bicarb and have repeat BMET in PCP office in 1 week to see if she needs continue treatment with po bicarb. Pt does not have any hx of COPD or chronic lung disease. No reason to suspect she had PCO2 retention.

## 2024-04-04 NOTE — ED Provider Notes (Signed)
 [facility] Provider Note    Event Date/Time   First MD Initiated Contact with Patient 04/04/24 1743     (approximate)   History   Weakness   HPI  Tammy Arias is a 68 y.o. female who was seen today by Dr. Ofilia Benton from general surgery due to concerns for dehydration.  Patient had surgery on February 28, 2024 with an end ileostomy for C. difficile colitis.  Patient had labs outpatient that showed a K of 5.7 sodium of 127 BUN of 30 and creatinine of 2.17 she was sent in for admission  I reviewed the notes from 5/1 until 5/4 where patient was admitted for dehydration, AKI  Patient already received a liter of fluids from the outpatient area and they were trying to do a direct admit from this area for monitoring for her bile in her ileostomy and her AKI however they were not able to do a direct admit therefore they were sent down to the ER to be admitted from the ER.  She just reports some mild weakness but states that this has been intermittent since having the ileostomy and she has had prior admissions for dehydration.     Physical Exam   Triage Vital Signs: ED Triage Vitals [04/04/24 1625]  Encounter Vitals Group     BP 123/64     Systolic BP Percentile      Diastolic BP Percentile      Pulse Rate (!) 105     Resp 20     Temp 98.1 F (36.7 C)     Temp Source Oral     SpO2 97 %     Weight 110 lb (49.9 kg)     Height 5\' 1"  (1.549 m)     Head Circumference      Peak Flow      Pain Score 4     Pain Loc      Pain Education      Exclude from Growth Chart     Most recent vital signs: Vitals:   04/04/24 1745 04/04/24 1750  BP: (!) 109/59   Pulse: 99 96  Resp: (!) 21 17  Temp:    SpO2: 100% 97%     General: Awake, no distress.  CV:  Good peripheral perfusion.  Resp:  Normal effort.  Abd:  No distention.  Ileostomy bag with greenish discharge coming out of it.  Abdomen soft and nontender Other:     ED Results / Procedures / Treatments    Labs (all labs ordered are listed, but only abnormal results are displayed) Labs Reviewed  URINALYSIS, ROUTINE W REFLEX MICROSCOPIC     EKG  My interpretation of EKG:  Atrial fibrillation with a rate of 99 without any ST elevation, T wave versions in the inferior lateral leads.  Reviewed patient's prior EKGs and she has had these similar T wave inversions  RADIOLOGY   PROCEDURES:  Critical Care performed: No  Procedures   MEDICATIONS ORDERED IN ED: Medications  sodium chloride  0.9 % bolus 1,000 mL (has no administration in time range)  sodium zirconium cyclosilicate  (LOKELMA ) packet 10 g (has no administration in time range)  enoxaparin  (LOVENOX ) injection 40 mg (has no administration in time range)  acetaminophen  (TYLENOL ) tablet 650 mg (has no administration in time range)    Or  acetaminophen  (TYLENOL ) suppository 650 mg (has no administration in time range)  polyethylene glycol (MIRALAX  / GLYCOLAX ) packet 17 g (has no administration in time range)  sodium chloride  flush (NS) 0.9 % injection 3 mL (has no administration in time range)  dextrose  5 %-0.9 % sodium chloride  infusion (has no administration in time range)     IMPRESSION / MDM / ASSESSMENT AND PLAN / ED COURSE  I reviewed the triage vital signs and the nursing notes.   Patient's presentation is most consistent with acute presentation with potential threat to life or bodily function.   Patient comes in with concerns for dehydration with ileostomy in place with the plan for admission.  There is no black stool in there to suggest it being related to GI bleed but I will add on CBC.  CMP was reviewed from today at 2:00 with low bicarb, creatinine of 2.17 potassium of 4.7.  I discussed the case the hospitalist for admission.  I placed a repeat BMP given patient is now had some fluids to see if we need to give any medications for the potassium. These are still pending at time of admission.   The patient is on  the cardiac monitor to evaluate for evidence of arrhythmia and/or significant heart rate changes.      FINAL CLINICAL IMPRESSION(S) / ED DIAGNOSES   Final diagnoses:  Dehydration  AKI (acute kidney injury) (HCC)  Ileostomy in place [facility])     Rx / DC Orders   ED Discharge Orders     None        Note:  This document was prepared using Dragon voice recognition software and may include unintentional dictation errors.   Lubertha Rush, MD 04/04/24 [place]

## 2024-04-04 NOTE — Assessment & Plan Note (Addendum)
 On admission. See HPI for clinical course, check C. difficile and stool pathogen panel.  At this time do not see indication for antibiotic therapy given no fever no leukocytosis and no toxic symptoms  04-05-2024 GI panel positive for Novovirus. Continue with supportive care. Do NOT give any anti-diarrheals.  04-06-2024 high output from ileostomy has resolved. Scr normal now at 0.73. stop IV bicarb gtts. Change to IV LR. Monitoring ileostomy output. Potential for DC to home tomorrow.  04-07-2024 resolved. Stable for DC to home.

## 2024-04-04 NOTE — ED Triage Notes (Signed)
 Patient states she had outpatient labs today; sent over for K 5.7, Na 127, BUN 38 and Creatinine 2.17

## 2024-04-05 ENCOUNTER — Other Ambulatory Visit: Payer: Self-pay

## 2024-04-05 DIAGNOSIS — E8722 Chronic metabolic acidosis: Secondary | ICD-10-CM

## 2024-04-05 DIAGNOSIS — I482 Chronic atrial fibrillation, unspecified: Secondary | ICD-10-CM

## 2024-04-05 DIAGNOSIS — N1831 Chronic kidney disease, stage 3a: Secondary | ICD-10-CM

## 2024-04-05 DIAGNOSIS — N179 Acute kidney failure, unspecified: Secondary | ICD-10-CM | POA: Diagnosis not present

## 2024-04-05 DIAGNOSIS — A0811 Acute gastroenteropathy due to Norwalk agent: Secondary | ICD-10-CM

## 2024-04-05 DIAGNOSIS — I5022 Chronic systolic (congestive) heart failure: Secondary | ICD-10-CM

## 2024-04-05 DIAGNOSIS — E871 Hypo-osmolality and hyponatremia: Secondary | ICD-10-CM

## 2024-04-05 LAB — URINALYSIS, ROUTINE W REFLEX MICROSCOPIC
Bilirubin Urine: NEGATIVE
Glucose, UA: NEGATIVE mg/dL
Hgb urine dipstick: NEGATIVE
Ketones, ur: NEGATIVE mg/dL
Nitrite: NEGATIVE
Protein, ur: NEGATIVE mg/dL
Specific Gravity, Urine: 1.006 (ref 1.005–1.030)
pH: 5 (ref 5.0–8.0)

## 2024-04-05 LAB — CBC
HCT: 33.2 % — ABNORMAL LOW (ref 36.0–46.0)
Hemoglobin: 10.7 g/dL — ABNORMAL LOW (ref 12.0–15.0)
MCH: 32.3 pg (ref 26.0–34.0)
MCHC: 32.2 g/dL (ref 30.0–36.0)
MCV: 100.3 fL — ABNORMAL HIGH (ref 80.0–100.0)
Platelets: 73 10*3/uL — ABNORMAL LOW (ref 150–400)
RBC: 3.31 MIL/uL — ABNORMAL LOW (ref 3.87–5.11)
RDW: 18.5 % — ABNORMAL HIGH (ref 11.5–15.5)
WBC: 5.3 10*3/uL (ref 4.0–10.5)
nRBC: 0 % (ref 0.0–0.2)

## 2024-04-05 LAB — GASTROINTESTINAL PANEL BY PCR, STOOL (REPLACES STOOL CULTURE)

## 2024-04-05 LAB — GLUCOSE, CAPILLARY
Glucose-Capillary: 149 mg/dL — ABNORMAL HIGH (ref 70–99)
Glucose-Capillary: 188 mg/dL — ABNORMAL HIGH (ref 70–99)

## 2024-04-05 LAB — BASIC METABOLIC PANEL WITH GFR
Anion gap: 10 (ref 5–15)
BUN: 33 mg/dL — ABNORMAL HIGH (ref 8–23)
CO2: 18 mmol/L — ABNORMAL LOW (ref 22–32)
Calcium: 9.2 mg/dL (ref 8.9–10.3)
Chloride: 101 mmol/L (ref 98–111)
Creatinine, Ser: 1.29 mg/dL — ABNORMAL HIGH (ref 0.44–1.00)
GFR, Estimated: 45 mL/min — ABNORMAL LOW (ref >60)
Glucose, Bld: 158 mg/dL — ABNORMAL HIGH (ref 70–99)
Potassium: 3.9 mmol/L (ref 3.5–5.1)
Sodium: 129 mmol/L — ABNORMAL LOW (ref 135–145)

## 2024-04-05 LAB — PROTIME-INR
INR: 1.5 — ABNORMAL HIGH (ref 0.8–1.2)
Prothrombin Time: 18.7 s — ABNORMAL HIGH (ref 11.4–15.2)

## 2024-04-05 LAB — CBG MONITORING, ED
Glucose-Capillary: 167 mg/dL — ABNORMAL HIGH (ref 70–99)
Glucose-Capillary: 229 mg/dL — ABNORMAL HIGH (ref 70–99)

## 2024-04-05 LAB — APTT: aPTT: 35 s (ref 24–36)

## 2024-04-05 LAB — C DIFFICILE QUICK SCREEN W PCR REFLEX
C Diff antigen: NEGATIVE
C Diff interpretation: NOT DETECTED
C Diff toxin: NEGATIVE

## 2024-04-05 LAB — SODIUM, URINE, RANDOM: Sodium, Ur: <10 mmol/L

## 2024-04-05 NOTE — Plan of Care (Signed)

## 2024-04-05 NOTE — Subjective & Objective (Signed)
 Pt seen and examined. Ostomy output normal. Walked 400 feet yesterday with PT. Eating well. No concerns. Ready to go home today.

## 2024-04-05 NOTE — Hospital Course (Signed)
 HPI:  Tammy Arias is a 68 y.o. female with medical history significant of CHF, osteoarthritis, atrial fibrillation, coronary artery disease, HFrEF, GERD, hypertension and hypothyroidism as well as type 2 diabetes mellitus, s/p exploratory laparotomy, total colectomy, and end ileostomy creation for septic shock secondary to C diff colitis in April 2025 . [facility] last hospitalist just 6 days ago for  "acute onset of diminished appetite and feeling dehydrated as well as vomiting today. " DC 03/31/2024     Patient reports being in her usual state of health until about 2 days ago when she reports a new onset of marked green-colored liquid stool output from the ileostomy requiring emptying the bag several times a day.  Not associated with any melena or bleeding.  Not associated with any vomiting or abdominal pain or fevers.  However it was associated with marked reduced appetite and some nausea.   Patient became generally weak and fatigued.  Presented to surgical clinic earlier today where she had labs done.  Notable for moderate hyponatremia hyperkalemia acidosis as well as AKI.  Patient is sent to the ER medical evaluation is sought. Patinet remains vitally stable (she has chronic hypotension for wich she take midodrine )  Significant Events: Admitted 04/04/2024 for AKI   Significant Labs: Na 128, K 5.6, CO2 of 13, BUN 40, Scr 2.11, glu 100 WBC 9.3, HgB 11.8, plt 98 Serum osm 285 Urine Na < 10  Significant Imaging Studies:   Antibiotic Therapy: Anti-infectives (From admission, onward)    None       Procedures:   Consultants:

## 2024-04-05 NOTE — Assessment & Plan Note (Signed)
 04-05-2024 monitor for volume overload while on IVF.  04-06-2024 stable.   04-07-2024 stable. No volume overload. On RA.

## 2024-04-05 NOTE — Assessment & Plan Note (Signed)
 04-05-2024 stable.  04-06-2024 stable.   04-07-2024 stable.

## 2024-04-05 NOTE — Assessment & Plan Note (Signed)
 04-05-2024 continue with Eliquis .  04-06-2024 stable. continue with Eliquis .  04-07-2024 stable. Continue eliquis 

## 2024-04-05 NOTE — Progress Notes (Signed)
 PROGRESS NOTE    Tammy Arias  BMW:[id number] DOB: 18-Jul-1956 DOA: 04/04/2024 PCP: Lyle San, MD  Subjective: Pt seen and examined. Pt's GI diarrhea panel positive for Norovirus. Pt remains on IV bicarb gtts. No N/V. Copious amounts out of her ileostomy   Hospital Course: HPI:  Tammy Arias is a 68 y.o. female with medical history significant of CHF, osteoarthritis, atrial fibrillation, coronary artery disease, HFrEF, GERD, hypertension and hypothyroidism as well as type 2 diabetes mellitus, s/p exploratory laparotomy, total colectomy, and end ileostomy creation for septic shock secondary to C diff colitis in April 2025 . [facility] last hospitalist just 6 days ago for  "acute onset of diminished appetite and feeling dehydrated as well as vomiting today. " DC 03/31/2024     Patient reports being in her usual state of health until about 2 days ago when she reports a new onset of marked green-colored liquid stool output from the ileostomy requiring emptying the bag several times a day.  Not associated with any melena or bleeding.  Not associated with any vomiting or abdominal pain or fevers.  However it was associated with marked reduced appetite and some nausea.   Patient became generally weak and fatigued.  Presented to surgical clinic earlier today where she had labs done.  Notable for moderate hyponatremia hyperkalemia acidosis as well as AKI.  Patient is sent to the ER medical evaluation is sought. Patinet remains vitally stable (she has chronic hypotension for wich she take midodrine )  Significant Events: Admitted 04/04/2024 for AKI   Significant Labs: Na 128, K 5.6, CO2 of 13, BUN 40, Scr 2.11, glu 100 WBC 9.3, HgB 11.8, plt 98 Serum osm 285 Urine Na < 10  Significant Imaging Studies:   Antibiotic Therapy: Anti-infectives (From admission, onward)    None       Procedures:   Consultants:     Assessment and Plan: * Acute kidney injury superimposed on stage 3a  chronic kidney disease (HCC) - baseline scr 1.2 On admission. At this time patient is pending urine studies including urine sodium, however given the entirety of the clinical picture this is most consistent with prerenal AKI with possibility of ATN on top.  This is complicated by moderate asymptomatic hyponatremia, mild hyperkalemia without peaked T waves on the EKG. Treating with saline bolus in the ER.  Continue with sodium bicarbonate  infusion IV got Lokelma  once.  Will trend laboratory markers. And urine output.. hold spironolactone  and lasix .  04-05-2024 Scr 1.29 today. Scr was 1.73 yesterday. AKI improving with IVF. AKI due to gastroenteritis caused by Norovirus.  Enteritis due to Norovirus On admission. See HPI for clinical course, check C. difficile and stool pathogen panel.  At this time do not see indication for antibiotic therapy given no fever no leukocytosis and no toxic symptoms  04-05-2024 GI panel positive for Novovirus. Continue with supportive care. Do NOT give any anti-diarrheals.  Chronic metabolic acidosis On admission. Nonanion gap likely due to enteric losses.  Review of labs shows that this is chronic, uncontrolled.  Will add sodium bicarbonate  infusion.  To oral sodium bicarbonate  therapy  04-05-2024 pt has acute on chronic metabolic acidosis. Chronic acidosis due to ongoing bicarbonate losses from her ileostomy. Now exacerbated due to more output from ileostom from Novorovirus. Continue with IV bicarb and po bicarb.  Hyponatremia 04-05-2024 hyponatremia is due to hypovolemia.  Glaucoma 04-05-2024 Continue with Xalatan   Elevated LFTs On admission. Please minimal, appears to be chronic on outpatient lab review.  Outpatient  workup  04-05-2024 stable.  Ileostomy in place [facility]) 04-05-2024 stable.  Type 2 diabetes mellitus with chronic kidney disease, without long-term current use of insulin  (HCC) On admission. Given AKI, hold metformin .  Treat with insulin  sliding  scale sensitive  04-05-2024 CBG stable  Chronic systolic CHF (congestive heart failure) (HCC) - LVEF 35 to 40% 04-05-2024 monitor for volume overload while on IVF.  Chronic anticoagulation - on Eliquis  for chronic afib 04-05-2024 continue with Eliquis .  Atrial fibrillation, chronic (HCC) 04-05-2024 continue with Eliquis .  Hypothyroidism Continue with Synthroid   Gout Not in flare, continue with allopurinol   Gastric AVM crhonic, continue with pantoprazole    DVT prophylaxis: apixaban  (ELIQUIS ) tablet 2.5 mg Start: 04/04/24 2200 SCDs Start: 04/04/24 1906 apixaban  (ELIQUIS ) tablet 2.5 mg     Code Status: Full Code Family Communication: no family at bedside Disposition Plan: return home Reason for continuing need for hospitalization: remains on IVF, monitoring labs and ileostomy output.  Objective: Vitals:   04/05/24 0700 04/05/24 0830 04/05/24 0900 04/05/24 0930  BP: (!) 102/49 109/60 105/61 (!) 101/54  Pulse: 77 93 91 89  Resp: 15 12 14 17   Temp:   98.3 F (36.8 C)   TempSrc:   Oral   SpO2: 100% 100% 100% 100%  Weight:      Height:       No intake or output data in the 24 hours ending 04/05/24 1131 Filed Weights   04/04/24 1625  Weight: 49.9 kg    Examination:  Physical Exam Vitals and nursing note reviewed.  Constitutional:      General: She is not in acute distress.    Appearance: She is not toxic-appearing.     Comments: Appears chronically ill  HENT:     Head: Normocephalic and atraumatic.     Nose: Nose normal.  Cardiovascular:     Rate and Rhythm: Normal rate and regular rhythm.  Pulmonary:     Effort: Pulmonary effort is normal.     Breath sounds: Normal breath sounds.  Abdominal:     General: Bowel sounds are normal.     Palpations: Abdomen is soft.     Comments: +ileostomy  Musculoskeletal:     Right lower leg: No edema.     Left lower leg: No edema.  Skin:    General: Skin is warm and dry.     Capillary Refill: Capillary refill takes  less than 2 seconds.  Neurological:     Mental Status: She is alert and oriented to person, place, and time.     Data Reviewed: I have personally reviewed following labs and imaging studies  CBC: Recent Labs  Lab 03/30/24 0425 04/04/24 2045 04/05/24 0629  WBC 3.1* 9.3 5.3  NEUTROABS  --  7.3  --   HGB 11.1* 11.8* 10.7*  HCT 34.1* 36.2 33.2*  MCV 101.8* 102.0* 100.3*  PLT 109* 98* 73*   Basic Metabolic Panel: Recent Labs  Lab 03/30/24 0425 03/31/24 0447 04/04/24 1404 04/04/24 2017 04/04/24 2237 04/05/24 0629  NA 133* 133* 127* 128* 128* 129*  K 4.0 4.2 5.7* 5.6* 4.6 3.9  CL 107 110 100 102 106 101  CO2 15* 13* 15* 13* 14* 18*  GLUCOSE 106* 126* 128* 100* 149* 158*  BUN 36* 26* 38* 40* 38* 33*  CREATININE 1.30* 0.96 2.17* 2.11* 1.73* 1.29*  CALCIUM  9.7 9.8 10.7* 9.5 9.2 9.2  MG 1.6* 2.2  --   --   --   --   PHOS 3.2  --   --   --   --   --  GFR: Estimated Creatinine Clearance: 31.1 mL/min (A) (by C-G formula based on SCr of 1.29 mg/dL (H)). Liver Function Tests: Recent Labs  Lab 03/30/24 0425 04/04/24 1404  AST 37 51*  ALT 33 58*  ALKPHOS 206* 275*  BILITOT 0.7 0.8  PROT 7.5 8.2*  ALBUMIN  4.0 4.4   Recent Labs  Lab 03/30/24 0425 03/31/24 0447  LIPASE 272* 273*   Coagulation Profile: Recent Labs  Lab 04/05/24 0629  INR 1.5*   BNP (last 3 results) Recent Labs    02/28/24 0156 03/07/24 0507 03/17/24 1723  BNP 579.8* 936.6* 166.8*   CBG: Recent Labs  Lab 03/30/24 2046 03/31/24 0826 03/31/24 1146 04/04/24 2235 04/05/24 0802  GLUCAP 134* 128* 209* 150* 167*    Recent Results (from the past 240 hours)  C Difficile Quick Screen w PCR reflex     Status: None   Collection Time: 04/05/24  6:07 AM   Specimen: STOOL  Result Value Ref Range Status   C Diff antigen NEGATIVE NEGATIVE Final   C Diff toxin NEGATIVE NEGATIVE Final   C Diff interpretation No C. difficile detected.  Final    Comment: Performed at [facility], [address] [place], [place] [address]  Gastrointestinal Panel by PCR , Stool     Status: Abnormal   Collection Time: 04/05/24  6:07 AM   Specimen: STOOL  Result Value Ref Range Status   Campylobacter species NOT DETECTED NOT DETECTED Final   Plesimonas shigelloides NOT DETECTED NOT DETECTED Final   Salmonella species NOT DETECTED NOT DETECTED Final   Yersinia enterocolitica NOT DETECTED NOT DETECTED Final   Vibrio species NOT DETECTED NOT DETECTED Final   Vibrio cholerae NOT DETECTED NOT DETECTED Final   Enteroaggregative E coli (EAEC) NOT DETECTED NOT DETECTED Final   Enteropathogenic E coli (EPEC) NOT DETECTED NOT DETECTED Final   Enterotoxigenic E coli (ETEC) NOT DETECTED NOT DETECTED Final   Shiga like toxin producing E coli (STEC) NOT DETECTED NOT DETECTED Final   Shigella/Enteroinvasive E coli (EIEC) NOT DETECTED NOT DETECTED Final   Cryptosporidium NOT DETECTED NOT DETECTED Final   Cyclospora cayetanensis NOT DETECTED NOT DETECTED Final   Entamoeba histolytica NOT DETECTED NOT DETECTED Final   Giardia lamblia NOT DETECTED NOT DETECTED Final   Adenovirus F40/41 NOT DETECTED NOT DETECTED Final   Astrovirus NOT DETECTED NOT DETECTED Final   Norovirus GI/GII DETECTED (A) NOT DETECTED Final    Comment: RESULT CALLED TO, READ BACK BY AND VERIFIED WITH: ASHLEY DAVIS AT 0740 04/05/24.PMF    Rotavirus A NOT DETECTED NOT DETECTED Final   Sapovirus (I, II, IV, and V) NOT DETECTED NOT DETECTED Final    Comment: Performed at [facility], [address] [place], [place] [address]     Scheduled Meds:  allopurinol   300 mg Oral Daily   apixaban   2.5 mg Oral BID   insulin  aspart  0-5 Units Subcutaneous QHS   insulin  aspart  0-9 Units Subcutaneous TID WC   latanoprost   1 drop Both Eyes QHS   levothyroxine   50 mcg Oral Q0600   midodrine   5 mg Oral TID WC   pantoprazole   40 mg Oral Daily   rosuvastatin   5 mg Oral Daily   sodium bicarbonate   650 mg Oral TID   sodium chloride   flush  3 mL Intravenous Q12H   Continuous Infusions:  sodium bicarbonate  150 mEq in dextrose  5 % 1,150 mL infusion 125 mL/hr at 04/05/24 1122     LOS:  1 day   Time spent: 50 minutes  Unk Garb, DO  [facility]  04/05/2024, 11:31 AM

## 2024-04-05 NOTE — Assessment & Plan Note (Signed)
 04-05-2024 hyponatremia is due to hypovolemia.  04-06-2024 much improved. Na 132 now.  04-07-2024 resolved. Discharge Na of 134.

## 2024-04-06 DIAGNOSIS — E876 Hypokalemia: Secondary | ICD-10-CM

## 2024-04-06 DIAGNOSIS — N179 Acute kidney failure, unspecified: Secondary | ICD-10-CM | POA: Diagnosis not present

## 2024-04-06 DIAGNOSIS — Z7901 Long term (current) use of anticoagulants: Secondary | ICD-10-CM

## 2024-04-06 DIAGNOSIS — E871 Hypo-osmolality and hyponatremia: Secondary | ICD-10-CM | POA: Diagnosis not present

## 2024-04-06 DIAGNOSIS — A0811 Acute gastroenteropathy due to Norwalk agent: Secondary | ICD-10-CM | POA: Diagnosis not present

## 2024-04-06 DIAGNOSIS — E8722 Chronic metabolic acidosis: Secondary | ICD-10-CM | POA: Diagnosis not present

## 2024-04-06 LAB — CBC WITH DIFFERENTIAL/PLATELET
Abs Immature Granulocytes: 0.02 10*3/uL (ref 0.00–0.07)
Abs Immature Granulocytes: 0.03 10*3/uL (ref 0.00–0.07)
Basophils Absolute: 0 10*3/uL (ref 0.0–0.1)
Basophils Absolute: 0 10*3/uL (ref 0.0–0.1)
Basophils Relative: 0 %
Basophils Relative: 0 %
Eosinophils Absolute: 0.2 10*3/uL (ref 0.0–0.5)
Eosinophils Absolute: 0.2 10*3/uL (ref 0.0–0.5)
Eosinophils Relative: 10 %
Eosinophils Relative: 6 %
HCT: 27.4 % — ABNORMAL LOW (ref 36.0–46.0)
HCT: 30.4 % — ABNORMAL LOW (ref 36.0–46.0)
Hemoglobin: 9.3 g/dL — ABNORMAL LOW (ref 12.0–15.0)
Hemoglobin: 10.4 g/dL — ABNORMAL LOW (ref 12.0–15.0)
Immature Granulocytes: 1 %
Immature Granulocytes: 1 %
Lymphocytes Relative: 13 %
Lymphocytes Relative: 7 %
Lymphs Abs: 0.3 10*3/uL — ABNORMAL LOW (ref 0.7–4.0)
Lymphs Abs: 0.3 10*3/uL — ABNORMAL LOW (ref 0.7–4.0)
MCH: 33 pg (ref 26.0–34.0)
MCH: 33 pg (ref 26.0–34.0)
MCHC: 33.9 g/dL (ref 30.0–36.0)
MCHC: 34.2 g/dL (ref 30.0–36.0)
MCV: 97.2 fL (ref 80.0–100.0)
MCV: 96.5 fL (ref 80.0–100.0)
Monocytes Absolute: 0.3 10*3/uL (ref 0.1–1.0)
Monocytes Absolute: 0.3 10*3/uL (ref 0.1–1.0)
Monocytes Relative: 11 %
Monocytes Relative: 8 %
Neutro Abs: 1.5 10*3/uL — ABNORMAL LOW (ref 1.7–7.7)
Neutro Abs: 2.9 10*3/uL (ref 1.7–7.7)
Neutrophils Relative %: 65 %
Neutrophils Relative %: 78 %
Platelets: 58 10*3/uL — ABNORMAL LOW (ref 150–400)
Platelets: 82 10*3/uL — ABNORMAL LOW (ref 150–400)
RBC: 2.82 MIL/uL — ABNORMAL LOW (ref 3.87–5.11)
RBC: 3.15 MIL/uL — ABNORMAL LOW (ref 3.87–5.11)
RDW: 17.9 % — ABNORMAL HIGH (ref 11.5–15.5)
RDW: 18.1 % — ABNORMAL HIGH (ref 11.5–15.5)
WBC: 2.3 10*3/uL — ABNORMAL LOW (ref 4.0–10.5)
WBC: 3.7 10*3/uL — ABNORMAL LOW (ref 4.0–10.5)
nRBC: 0 % (ref 0.0–0.2)
nRBC: 0 % (ref 0.0–0.2)

## 2024-04-06 LAB — MAGNESIUM: Magnesium: 1.1 mg/dL — ABNORMAL LOW (ref 1.7–2.4)

## 2024-04-06 LAB — GLUCOSE, CAPILLARY
Glucose-Capillary: 164 mg/dL — ABNORMAL HIGH (ref 70–99)
Glucose-Capillary: 170 mg/dL — ABNORMAL HIGH (ref 70–99)
Glucose-Capillary: 135 mg/dL — ABNORMAL HIGH (ref 70–99)
Glucose-Capillary: 151 mg/dL — ABNORMAL HIGH (ref 70–99)

## 2024-04-06 LAB — BASIC METABOLIC PANEL WITH GFR
Anion gap: 12 (ref 5–15)
BUN: 18 mg/dL (ref 8–23)
CO2: 35 mmol/L — ABNORMAL HIGH (ref 22–32)
Calcium: 8 mg/dL — ABNORMAL LOW (ref 8.9–10.3)
Chloride: 85 mmol/L — ABNORMAL LOW (ref 98–111)
Creatinine, Ser: 0.73 mg/dL (ref 0.44–1.00)
GFR, Estimated: >60 mL/min (ref >60)
Glucose, Bld: 344 mg/dL — ABNORMAL HIGH (ref 70–99)
Potassium: 2.8 mmol/L — ABNORMAL LOW (ref 3.5–5.1)
Sodium: 132 mmol/L — ABNORMAL LOW (ref 135–145)

## 2024-04-06 MED ORDER — POTASSIUM CHLORIDE 20 MEQ PO PACK
40.0000 meq | PACK | ORAL | Status: AC
  Administered 2024-04-06 (×2): 40 meq via ORAL
  Filled 2024-04-06 (×2): qty 2

## 2024-04-06 MED ORDER — MAGNESIUM SULFATE 4 GM/100ML IV SOLN
4.0000 g | Freq: Once | INTRAVENOUS | Status: AC
  Administered 2024-04-06: 4 g via INTRAVENOUS
  Filled 2024-04-06: qty 100

## 2024-04-06 MED ORDER — LACTATED RINGERS IV SOLN: INTRAVENOUS | Status: DC

## 2024-04-06 NOTE — Evaluation (Signed)
 Physical Therapy Evaluation Patient Details Name: Tammy Arias MRN: [id number] DOB: [birth year] Today's Date: 04/06/2024  History of Present Illness  Patient is a 68 year old female that returned to ED after recent admission for vomiting, dehydration, workup for AKI. Recent  exploratory laparotomy, total colectomy, and end ileostomy creation for septic shock secondary to C diff colitis. History of CAD s/p CABG, heart failure.  Clinical Impression  Patient received in bed, she is pleasant and agrees to PT assessment. Patient is mod I for bed mobility transfers and ambulation with RW 400 feet. She is able to stand from commode independently and performed self care independently. Patient will continue to benefit from mobility while here via nursing staff or mobility specialists. No skilled PT needs at this time. Signing off.          If plan is discharge home, recommend the following: Help with stairs or ramp for entrance   Can travel by private vehicle    yes    Equipment Recommendations None recommended by PT  Recommendations for Other Services       Functional Status Assessment Patient has not had a recent decline in their functional status     Precautions / Restrictions Precautions Recall of Precautions/Restrictions: Intact Precaution/Restrictions Comments: mod fall, ostomy Restrictions Weight Bearing Restrictions Per Provider Order: No      Mobility  Bed Mobility Overal bed mobility: Modified Independent                  Transfers Overall transfer level: Modified independent Equipment used: Rolling walker (2 wheels) Transfers: Sit to/from Stand Sit to Stand: Modified independent (Device/Increase time)                Ambulation/Gait Ambulation/Gait assistance: Supervision Gait Distance (Feet): 400 Feet Assistive device: Rolling walker (2 wheels) Gait Pattern/deviations: Step-through pattern, WFL(Within Functional Limits) Gait velocity: WFL      General Gait Details: no difficulties noted  Stairs            Wheelchair Mobility     Tilt Bed    Modified Rankin (Stroke Patients Only)       Balance Overall balance assessment: Modified Independent Sitting-balance support: Feet supported Sitting balance-Leahy Scale: Normal     Standing balance support: Bilateral upper extremity supported, Single extremity supported, No upper extremity supported, During functional activity Standing balance-Leahy Scale:  (can walk short distances with Single UE support or no UE support without difficulty)                               Pertinent Vitals/Pain Pain Assessment Pain Assessment: No/denies pain    Home Living Family/patient expects to be discharged to:: Private residence Living Arrangements: Non-relatives/Friends Available Help at Discharge: Friend(s);Available 24 hours/day Type of Home: House Home Access: Stairs to enter Entrance Stairs-Rails: Doctor, general practice of Steps: 4   Home Layout: One level Home Equipment: Agricultural consultant (2 wheels);Cane - single point Additional Comments: the above information is at her friend's home where patient will be going at discharge. The patient's home has no steps to enter and a walk in shower    Prior Function Prior Level of Function : Independent/Modified Independent             Mobility Comments: intermittently using cane or RW for ambulation ADLs Comments: Mod I for ADLs an IADLs     Extremity/Trunk Assessment   Upper Extremity Assessment Upper Extremity Assessment: Overall  WFL for tasks assessed    Lower Extremity Assessment Lower Extremity Assessment: Overall WFL for tasks assessed    Cervical / Trunk Assessment Cervical / Trunk Assessment: Normal  Communication   Communication Communication: Impaired Factors Affecting Communication: Hearing impaired    Cognition Arousal: Alert Behavior During Therapy: WFL for tasks  assessed/performed   PT - Cognitive impairments: No apparent impairments                         Following commands: Intact       Cueing Cueing Techniques: Verbal cues     General Comments      Exercises     Assessment/Plan    PT Assessment Patient does not need any further PT services  PT Problem List         PT Treatment Interventions      PT Goals (Current goals can be found in the Care Plan section)  Acute Rehab PT Goals Patient Stated Goal: to go home, but may go back to stay with friend initially PT Goal Formulation: With patient Time For Goal Achievement: 04/13/24 Potential to Achieve Goals: Good    Frequency       Co-evaluation               AM-PAC PT "6 Clicks" Mobility  Outcome Measure Help needed turning from your back to your side while in a flat bed without using bedrails?: None Help needed moving from lying on your back to sitting on the side of a flat bed without using bedrails?: None Help needed moving to and from a bed to a chair (including a wheelchair)?: None Help needed standing up from a chair using your arms (e.g., wheelchair or bedside chair)?: None Help needed to walk in hospital room?: None Help needed climbing 3-5 steps with a railing? : A Little 6 Click Score: 23    End of Session   Activity Tolerance: Patient tolerated treatment well Patient left: in bed;with call bell/phone within reach Nurse Communication: Mobility status      Time: 1610-9604 PT Time Calculation (min) (ACUTE ONLY): 22 min   Charges:   PT Evaluation $PT Eval Low Complexity: 1 Low PT Treatments $Gait Training: 8-22 mins PT General Charges $$ ACUTE PT VISIT: 1 Visit         Sherlyn Ebbert, PT, GCS 04/06/24,3:20 PM

## 2024-04-06 NOTE — Progress Notes (Signed)
 PROGRESS NOTE    Tammy Arias  HYQ:[id number] DOB: 06-05-1956 DOA: 04/04/2024 PCP: Lyle San, MD  Subjective: Pt seen and examined. Ostomy output has slowed. Serum bicarb up to 35 on IV bicarb gtts. Bicarb gtts stopped. Change IVF to LR.  Eating adequately. No N/V. Drinking fluids well.   Hospital Course: HPI:  Tammy Arias is a 68 y.o. female with medical history significant of CHF, osteoarthritis, atrial fibrillation, coronary artery disease, HFrEF, GERD, hypertension and hypothyroidism as well as type 2 diabetes mellitus, s/p exploratory laparotomy, total colectomy, and end ileostomy creation for septic shock secondary to C diff colitis in April 2025 . [facility] last hospitalist just 6 days ago for  "acute onset of diminished appetite and feeling dehydrated as well as vomiting today. " DC 03/31/2024     Patient reports being in her usual state of health until about 2 days ago when she reports a new onset of marked green-colored liquid stool output from the ileostomy requiring emptying the bag several times a day.  Not associated with any melena or bleeding.  Not associated with any vomiting or abdominal pain or fevers.  However it was associated with marked reduced appetite and some nausea.   Patient became generally weak and fatigued.  Presented to surgical clinic earlier today where she had labs done.  Notable for moderate hyponatremia hyperkalemia acidosis as well as AKI.  Patient is sent to the ER medical evaluation is sought. Pat[facility] remains vitally stable (she has chronic hypotension for wich she take midodrine )  Significant Events: Admitted 04/04/2024 for AKI   Significant Labs: Na 128, K 5.6, CO2 of 13, BUN 40, Scr 2.11, glu 100 WBC 9.3, HgB 11.8, plt 98 Serum osm 285 Urine Na < 10  Significant Imaging Studies:   Antibiotic Therapy: Anti-infectives (From admission, onward)    None       Procedures:   Consultants:     Assessment and Plan: * Acute kidney  injury superimposed on stage 3a chronic kidney disease (HCC) - baseline scr 1.2 On admission. At this time patient is pending urine studies including urine sodium, however given the entirety of the clinical picture this is most consistent with prerenal AKI with possibility of ATN on top.  This is complicated by moderate asymptomatic hyponatremia, mild hyperkalemia without peaked T waves on the EKG. Treating with saline bolus in the ER.  Continue with sodium bicarbonate  infusion IV got Lokelma  once.  Will trend laboratory markers. And urine output.. hold spironolactone  and lasix .  04-05-2024 Scr 1.29 today. Scr was 1.73 yesterday. AKI improving with IVF. AKI due to gastroenteritis caused by Norovirus.  04-06-2024 resolved. Scr normal now at 0.73. stop IV bicarb gtts. Change to IV LR. Monitoring ileostomy output. Potential for DC to home tomorrow.  Enteritis due to Norovirus On admission. See HPI for clinical course, check C. difficile and stool pathogen panel.  At this time do not see indication for antibiotic therapy given no fever no leukocytosis and no toxic symptoms  04-05-2024 GI panel positive for Novovirus. Continue with supportive care. Do NOT give any anti-diarrheals.  04-06-2024 high output from ileostomy has resolved. Scr normal now at 0.73. stop IV bicarb gtts. Change to IV LR. Monitoring ileostomy output. Potential for DC to home tomorrow.  Chronic metabolic acidosis On admission. Nonanion gap likely due to enteric losses.  Review of labs shows that this is chronic, uncontrolled.  Will add sodium bicarbonate  infusion.  To oral sodium bicarbonate  therapy  04-05-2024 pt has acute  on chronic metabolic acidosis. Chronic acidosis due to ongoing bicarbonate losses from her ileostomy. Now exacerbated due to more output from ileostom from Novorovirus. Continue with IV bicarb and po bicarb.  04-06-2024 Serum bicarb up to 35 on IV bicarb gtts. Bicarb gtts stopped. Continue po  bicarb  Hyponatremia 04-05-2024 hyponatremia is due to hypovolemia.  04-06-2024 much improved. Na 132 now.  Hypokalemia 04-06-2024 replete with po kcl  Glaucoma 04-05-2024 Continue with Xalatan   Elevated LFTs On admission. Please minimal, appears to be chronic on outpatient lab review.  Outpatient workup  04-05-2024 stable.    Ileostomy in place [facility]) 04-05-2024 stable.  04-06-2024 stable.   Type 2 diabetes mellitus with chronic kidney disease, without long-term current use of insulin  (HCC) On admission. Given AKI, hold metformin .  Treat with insulin  sliding scale sensitive  04-05-2024 CBG stable  04-06-2024 stable.   Hypomagnesemia 04-06-2024 replete with IV mg  Chronic systolic CHF (congestive heart failure) (HCC) - LVEF 35 to 40% 04-05-2024 monitor for volume overload while on IVF.  04-06-2024 stable.   Chronic anticoagulation - on Eliquis  for chronic afib 04-05-2024 continue with Eliquis .  04-06-2024 stable.   Atrial fibrillation, chronic (HCC) 04-05-2024 continue with Eliquis .  04-06-2024 stable. continue with Eliquis .  Hypothyroidism Continue with Synthroid   Gout Not in flare, continue with allopurinol   Gastric AVM crhonic, continue with pantoprazole    DVT prophylaxis: apixaban  (ELIQUIS ) tablet 2.5 mg Start: 04/04/24 2200 SCDs Start: 04/04/24 1906 apixaban  (ELIQUIS ) tablet 2.5 mg     Code Status: Full Code Family Communication: no family at bedside Disposition Plan: return home Reason for continuing need for hospitalization: monitor off IV bicarb gtts. Monitor ileostomy output.  Objective: Vitals:   04/05/24 1639 04/05/24 1937 04/06/24 0325 04/06/24 0826  BP: (!) 119/58 112/66 109/72 112/62  Pulse: 85 81 80 94  Resp: 18 16 16 19   Temp: 98.2 F (36.8 C) 97.7 F (36.5 C) 97.9 F (36.6 C) 98.7 F (37.1 C)  TempSrc: Oral  Oral Oral  SpO2: 100% 100% 98% 99%  Weight:      Height:        Intake/Output Summary (Last 24 hours) at  04/06/2024 1118 Last data filed at 04/06/2024 0736 Gross per 24 hour  Intake 3570 ml  Output 675 ml  Net 2895 ml   Filed Weights   04/04/24 1625  Weight: 49.9 kg    Examination:  Physical Exam Vitals and nursing note reviewed.  Constitutional:      General: She is not in acute distress.    Appearance: She is not toxic-appearing.     Comments: Looks better today  HENT:     Head: Normocephalic and atraumatic.     Nose: Nose normal.  Cardiovascular:     Rate and Rhythm: Normal rate and regular rhythm.  Pulmonary:     Effort: Pulmonary effort is normal.  Abdominal:     General: Bowel sounds are normal.     Palpations: Abdomen is soft.     Comments: Brown liquid in ileostomy  Musculoskeletal:     Right lower leg: No edema.     Left lower leg: No edema.  Skin:    General: Skin is warm and dry.     Capillary Refill: Capillary refill takes less than 2 seconds.  Neurological:     General: No focal deficit present.     Mental Status: She is alert and oriented to person, place, and time.     Data Reviewed: I have personally reviewed following labs  and imaging studies  CBC: Recent Labs  Lab 04/04/24 2045 04/05/24 0629 04/06/24 0452  WBC 9.3 5.3 2.3*  NEUTROABS 7.3  --  1.5*  HGB 11.8* 10.7* 9.3*  HCT 36.2 33.2* 27.4*  MCV 102.0* 100.3* 97.2  PLT 98* 73* 58*   Basic Metabolic Panel: Recent Labs  Lab 03/31/24 0447 04/04/24 1404 04/04/24 2017 04/04/24 2237 04/05/24 0629 04/06/24 0452  NA 133* 127* 128* 128* 129* 132*  K 4.2 5.7* 5.6* 4.6 3.9 2.8*  CL 110 100 102 106 101 85*  CO2 13* 15* 13* 14* 18* 35*  GLUCOSE 126* 128* 100* 149* 158* 344*  BUN 26* 38* 40* 38* 33* 18  CREATININE 0.96 2.17* 2.11* 1.73* 1.29* 0.73  CALCIUM  9.8 10.7* 9.5 9.2 9.2 8.0*  MG 2.2  --   --   --   --  1.1*   GFR: Estimated Creatinine Clearance: 50.1 mL/min (by C-G formula based on SCr of 0.73 mg/dL). Liver Function Tests: Recent Labs  Lab 04/04/24 1404  AST 51*  ALT 58*   ALKPHOS 275*  BILITOT 0.8  PROT 8.2*  ALBUMIN  4.4   Recent Labs  Lab 03/31/24 0447  LIPASE 273*   Coagulation Profile: Recent Labs  Lab 04/05/24 0629  INR 1.5*   BNP (last 3 results) Recent Labs    02/28/24 0156 03/07/24 0507 03/17/24 1723  BNP 579.8* 936.6* 166.8*   CBG: Recent Labs  Lab 04/05/24 0802 04/05/24 1157 04/05/24 1623 04/05/24 2117 04/06/24 0823  GLUCAP 167* 229* 149* 188* 164*    Recent Results (from the past 240 hours)  C Difficile Quick Screen w PCR reflex     Status: None   Collection Time: 04/05/24  6:07 AM   Specimen: STOOL  Result Value Ref Range Status   C Diff antigen NEGATIVE NEGATIVE Final   C Diff toxin NEGATIVE NEGATIVE Final   C Diff interpretation No C. difficile detected.  Final    Comment: Performed at [facility], [address] [place], [place] [address]  Gastrointestinal Panel by PCR , Stool     Status: Abnormal   Collection Time: 04/05/24  6:07 AM   Specimen: STOOL  Result Value Ref Range Status   Campylobacter species NOT DETECTED NOT DETECTED Final   Plesimonas shigelloides NOT DETECTED NOT DETECTED Final   Salmonella species NOT DETECTED NOT DETECTED Final   Yersinia enterocolitica NOT DETECTED NOT DETECTED Final   Vibrio species NOT DETECTED NOT DETECTED Final   Vibrio cholerae NOT DETECTED NOT DETECTED Final   Enteroaggregative E coli (EAEC) NOT DETECTED NOT DETECTED Final   Enteropathogenic E coli (EPEC) NOT DETECTED NOT DETECTED Final   Enterotoxigenic E coli (ETEC) NOT DETECTED NOT DETECTED Final   Shiga like toxin producing E coli (STEC) NOT DETECTED NOT DETECTED Final   Shigella/Enteroinvasive E coli (EIEC) NOT DETECTED NOT DETECTED Final   Cryptosporidium NOT DETECTED NOT DETECTED Final   Cyclospora cayetanensis NOT DETECTED NOT DETECTED Final   Entamoeba histolytica NOT DETECTED NOT DETECTED Final   Giardia lamblia NOT DETECTED NOT DETECTED Final   Adenovirus F40/41 NOT DETECTED NOT DETECTED  Final   Astrovirus NOT DETECTED NOT DETECTED Final   Norovirus GI/GII DETECTED (A) NOT DETECTED Final    Comment: RESULT CALLED TO, READ BACK BY AND VERIFIED WITH: ASHLEY DAVIS AT 0740 04/05/24.PMF    Rotavirus A NOT DETECTED NOT DETECTED Final   Sapovirus (I, II, IV, and V) NOT DETECTED NOT DETECTED Final    Comment: Performed at  [facility], [address] [place], [place] [address]    Scheduled Meds:  allopurinol   300 mg Oral Daily   apixaban   2.5 mg Oral BID   insulin  aspart  0-5 Units Subcutaneous QHS   insulin  aspart  0-9 Units Subcutaneous TID WC   latanoprost   1 drop Both Eyes QHS   levothyroxine   50 mcg Oral Q0600   midodrine   5 mg Oral TID WC   pantoprazole   40 mg Oral Daily   potassium chloride   40 mEq Oral Q4H   rosuvastatin   5 mg Oral Daily   sodium bicarbonate   650 mg Oral TID   sodium chloride  flush  3 mL Intravenous Q12H   Continuous Infusions:  lactated ringers  100 mL/hr at 04/06/24 1048   magnesium  sulfate bolus IVPB 4 g (04/06/24 1052)     LOS: 2 days   Time spent: 50 minutes  Unk Garb, DO  [facility]  04/06/2024, 11:18 AM

## 2024-04-06 NOTE — Plan of Care (Signed)

## 2024-04-06 NOTE — Assessment & Plan Note (Signed)
 04-06-2024 replete with po kcl  04-07-2024 resolved. DC K of 4.7

## 2024-04-06 NOTE — Assessment & Plan Note (Signed)
 04-06-2024 replete with IV mg  04-07-2024 resolved. Discharge Mg of 2.2

## 2024-04-06 NOTE — TOC Initial Note (Signed)
 Transition of Care [facility]) - Initial/Assessment Note    Patient Details  Name: Tammy Arias MRN: [id number] Date of Birth: 1956/11/04  [facility]) CM/SW Contact:    Mulan Adan E Jhade Berko, LCSW Phone Number: 04/06/2024, 9:06 AM  Clinical Narrative:                 Patient known to [facility] for recent admission. Patient is active with [facility] for HHPT and RN - CSW confirmed with Bartholomew Light with [facility]. Patient's friend [name]) is involved and supportive.  Expected Discharge Plan: Home w Home Health Services Barriers to Discharge: Continued Medical Work up   Patient Goals and CMS Choice   CMS Medicare.gov Compare Post Acute Care list provided to:: Patient Choice offered to / list presented to : Patient [place] ownership interest in [facility].provided to:: Patient    Expected Discharge Plan and Services       Living arrangements for the past 2 months: Apartment                                      Prior Living Arrangements/Services Living arrangements for the past 2 months: Apartment Lives with:: Self Patient language and need for interpreter reviewed:: Yes Do you feel safe going back to the place where you live?: Yes      Need for Family Participation in Patient Care: Yes (Comment) Care giver support system in place?: Yes (comment) Current home services: DME, Home PT, Home RN Criminal Activity/Legal Involvement Pertinent to Current Situation/Hospitalization: No - Comment as needed  Activities of Daily Living   ADL Screening (condition at time of admission) Independently performs ADLs?: Yes (appropriate for developmental age) Is the patient deaf or have difficulty hearing?: No Does the patient have difficulty seeing, even when wearing glasses/contacts?: No Does the patient have difficulty concentrating, remembering, or making decisions?: No  Permission Sought/Granted Permission sought to share information with : Facility Games developer granted to share information with : Yes, Verbal Permission Granted              Emotional Assessment         Alcohol / Substance Use: Not Applicable Psych Involvement: No (comment)  Admission diagnosis:  Dehydration [E86.0] Ileostomy in place [facility]) [Z93.2] AKI (acute kidney injury) (HCC) [N17.9] Patient Active Problem List   Diagnosis Date Noted   Ileostomy in place (HCC) 04/04/2024   Status post total colectomy 04/04/2024   Chronic metabolic acidosis 04/04/2024   Enteritis due to Norovirus 04/04/2024   Elevated LFTs 04/04/2024   Glaucoma 04/04/2024   GERD without esophagitis 03/29/2024   Type 2 diabetes mellitus with chronic kidney disease, without long-term current use of insulin  (HCC) 03/29/2024   Dyslipidemia 03/29/2024   Chronic systolic CHF (congestive heart failure) (HCC) - LVEF 35 to 40% 03/17/2024   HTN (hypertension) 03/17/2024   Hypothyroidism 03/17/2024   Gout 03/17/2024   Hyponatremia 03/17/2024   Acute kidney injury superimposed on stage 3a chronic kidney disease (HCC) - baseline scr 1.2 03/17/2024   Chronic diastolic CHF (congestive heart failure) (HCC) 06/07/2023   Chronic anticoagulation - on Eliquis  for chronic afib 06/07/2023   Cirrhosis and Portal hypertension  on CT(HCC) 06/07/2023   Thrombocytopenia (HCC) 06/06/2023   CAD with hx of CABG 06/06/2023   OSA on CPAP 05/23/2023   S/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, right 04/05/2022   S/P TKR (total knee replacement)  04/05/2022   Colon cancer screening    Iron  deficiency anemia    Gastric AVM    Barrett's esophagus without dysplasia    Angiodysplasia of intestinal tract    Internal hemorrhoids    Pulmonary hypertension (HCC) 03/29/2017   Coronary artery disease involving native coronary artery of native heart with unstable angina pectoris (HCC) 03/21/2017   Ischemic cardiomyopathy 03/21/2017   Atrial fibrillation, chronic (HCC) 10/08/2014   Hyperlipidemia  10/08/2014   Type 2 diabetes mellitus (HCC) 10/08/2014   PCP:  Lyle San, MD Pharmacy:   [facility] - [place], [place] - [address] [place] [address] Phone: [phone] Fax: [phone]     Social Drivers of Health (SDOH) Social History: SDOH Screenings   Food Insecurity: No Food Insecurity (04/05/2024)  Housing: Low Risk  (04/05/2024)  Recent Concern: Housing - High Risk (02/28/2024)  Transportation Needs: No Transportation Needs (04/05/2024)  Utilities: Not At Risk (04/05/2024)  Financial Resource Strain: Low Risk  (02/11/2024)   Received from [facility]  Recent Concern: Financial Resource Strain - Medium Risk (12/13/2023)   [facility]  Social Connections: Moderately Integrated (04/05/2024)  Tobacco Use: Medium Risk (04/04/2024)   SDOH Interventions:     Readmission Risk Interventions    03/19/2024   10:52 AM 03/01/2024   12:07 PM 02/29/2024   11:10 AM  Readmission Risk Prevention Plan  Transportation Screening Complete Complete   PCP or Specialist Appt within 3-5 Days Complete Complete Complete  HRI or Home Care Consult Complete Complete Complete  Social Work Consult for Recovery Care Planning/Counseling Complete Complete Complete  Palliative Care Screening Not Applicable Not Applicable Not Applicable  Medication Review Oceanographer) Complete Complete

## 2024-04-07 DIAGNOSIS — E871 Hypo-osmolality and hyponatremia: Secondary | ICD-10-CM | POA: Diagnosis not present

## 2024-04-07 DIAGNOSIS — N179 Acute kidney failure, unspecified: Secondary | ICD-10-CM | POA: Diagnosis not present

## 2024-04-07 DIAGNOSIS — E8722 Chronic metabolic acidosis: Secondary | ICD-10-CM | POA: Diagnosis not present

## 2024-04-07 DIAGNOSIS — A0811 Acute gastroenteropathy due to Norwalk agent: Secondary | ICD-10-CM | POA: Diagnosis not present

## 2024-04-07 LAB — CBC WITH DIFFERENTIAL/PLATELET
Abs Immature Granulocytes: 0.04 10*3/uL (ref 0.00–0.07)
Basophils Absolute: 0 10*3/uL (ref 0.0–0.1)
Basophils Relative: 0 %
Eosinophils Absolute: 0.4 10*3/uL (ref 0.0–0.5)
Eosinophils Relative: 14 %
HCT: 31.2 % — ABNORMAL LOW (ref 36.0–46.0)
Hemoglobin: 10.5 g/dL — ABNORMAL LOW (ref 12.0–15.0)
Immature Granulocytes: 1 %
Lymphocytes Relative: 13 %
Lymphs Abs: 0.4 10*3/uL — ABNORMAL LOW (ref 0.7–4.0)
MCH: 33.5 pg (ref 26.0–34.0)
MCHC: 33.7 g/dL (ref 30.0–36.0)
MCV: 99.7 fL (ref 80.0–100.0)
Monocytes Absolute: 0.3 10*3/uL (ref 0.1–1.0)
Monocytes Relative: 11 %
Neutro Abs: 1.7 10*3/uL (ref 1.7–7.7)
Neutrophils Relative %: 61 %
Platelets: 73 10*3/uL — ABNORMAL LOW (ref 150–400)
RBC: 3.13 MIL/uL — ABNORMAL LOW (ref 3.87–5.11)
RDW: 18.2 % — ABNORMAL HIGH (ref 11.5–15.5)
WBC: 2.8 10*3/uL — ABNORMAL LOW (ref 4.0–10.5)
nRBC: 0 % (ref 0.0–0.2)

## 2024-04-07 LAB — BASIC METABOLIC PANEL WITH GFR
Anion gap: 9 (ref 5–15)
BUN: 15 mg/dL (ref 8–23)
CO2: 34 mmol/L — ABNORMAL HIGH (ref 22–32)
Calcium: 9.1 mg/dL (ref 8.9–10.3)
Chloride: 91 mmol/L — ABNORMAL LOW (ref 98–111)
Creatinine, Ser: 0.68 mg/dL (ref 0.44–1.00)
GFR, Estimated: >60 mL/min (ref >60)
Glucose, Bld: 122 mg/dL — ABNORMAL HIGH (ref 70–99)
Potassium: 4.7 mmol/L (ref 3.5–5.1)
Sodium: 134 mmol/L — ABNORMAL LOW (ref 135–145)

## 2024-04-07 LAB — GLUCOSE, CAPILLARY: Glucose-Capillary: 107 mg/dL — ABNORMAL HIGH (ref 70–99)

## 2024-04-07 LAB — MAGNESIUM: Magnesium: 2.2 mg/dL (ref 1.7–2.4)

## 2024-04-07 MED ORDER — ONDANSETRON HCL 4 MG PO TABS: 4.0000 mg | ORAL_TABLET | Freq: Four times a day (QID) | ORAL | 0 refills | Status: AC | PRN

## 2024-04-07 NOTE — Discharge Summary (Signed)
 [facility] Physician Discharge Summary   Patient name: Tammy Arias  Admit date:     04/04/2024  Discharge date: 04/07/2024  Attending Physician: Bennie Brave [[id number]]  Discharge Physician: Unk Garb   PCP: Lyle San, MD  Admitted From: Home  Disposition:  Home  Recommendations for Outpatient Follow-up:  Follow up with PCP in 1-2 weeks Needs repeat BMET in PCP f/u visit in 1 week to check serum bicarbonate level to see if she needs to restart po bicarb  Home Health:No Equipment/Devices: None    Discharge Condition:Stable CODE STATUS:FULL Diet recommendation: Diabetic Fluid Restriction: None  Hospital Summary: HPI:  Tammy Arias is a 68 y.o. female with medical history significant of CHF, osteoarthritis, atrial fibrillation, coronary artery disease, HFrEF, GERD, hypertension and hypothyroidism as well as type 2 diabetes mellitus, s/p exploratory laparotomy, total colectomy, and end ileostomy creation for septic shock secondary to C diff colitis in April 2025 . Patinet last hospitalist just 6 days ago for  "acute onset of diminished appetite and feeling dehydrated as well as vomiting today. " DC 03/31/2024     Patient reports being in her usual state of health until about 2 days ago when she reports a new onset of marked green-colored liquid stool output from the ileostomy requiring emptying the bag several times a day.  Not associated with any melena or bleeding.  Not associated with any vomiting or abdominal pain or fevers.  However it was associated with marked reduced appetite and some nausea.   Patient became generally weak and fatigued.  Presented to surgical clinic earlier today where she had labs done.  Notable for moderate hyponatremia hyperkalemia acidosis as well as AKI.  Patient is sent to the ER medical evaluation is sought. Pat[facility] remains vitally stable (she has chronic hypotension for wich she take midodrine )  Significant Events: Admitted 04/04/2024  for AKI   Significant Labs: Na 128, K 5.6, CO2 of 13, BUN 40, Scr 2.11, glu 100 WBC 9.3, HgB 11.8, plt 98 Serum osm 285 Urine Na < 10  Significant Imaging Studies:   Antibiotic Therapy: Anti-infectives (From admission, onward)    None       Procedures:   Consultants:    Hospital Course by Problem: * Acute kidney injury superimposed on stage 3a chronic kidney disease (HCC) - baseline scr 1.2 On admission. At this time patient is pending urine studies including urine sodium, however given the entirety of the clinical picture this is most consistent with prerenal AKI with possibility of ATN on top.  This is complicated by moderate asymptomatic hyponatremia, mild hyperkalemia without peaked T waves on the EKG. Treating with saline bolus in the ER.  Continue with sodium bicarbonate  infusion IV got Lokelma  once.  Will trend laboratory markers. And urine output.. hold spironolactone  and lasix .  04-05-2024 Scr 1.29 today. Scr was 1.73 yesterday. AKI improving with IVF. AKI due to gastroenteritis caused by Norovirus.  04-06-2024 resolved. Scr normal now at 0.73. stop IV bicarb gtts. Change to IV LR. Monitoring ileostomy output. Potential for DC to home tomorrow.  04-07-2024 resolved. Scr 0.68. Stable for DC to home.  Enteritis due to Norovirus On admission. See HPI for clinical course, check C. difficile and stool pathogen panel.  At this time do not see indication for antibiotic therapy given no fever no leukocytosis and no toxic symptoms  04-05-2024 GI panel positive for Novovirus. Continue with supportive care. Do NOT give any anti-diarrheals.  04-06-2024 high output from ileostomy has resolved. Scr  normal now at 0.73. stop IV bicarb gtts. Change to IV LR. Monitoring ileostomy output. Potential for DC to home tomorrow.  04-07-2024 resolved. Stable for DC to home.  Chronic metabolic acidosis On admission. Nonanion gap likely due to enteric losses.  Review of labs shows that  this is chronic, uncontrolled.  Will add sodium bicarbonate  infusion.  To oral sodium bicarbonate  therapy  04-05-2024 pt has acute on chronic metabolic acidosis. Chronic acidosis due to ongoing bicarbonate losses from her ileostomy. Now exacerbated due to more output from ileostom from Novorovirus. Continue with IV bicarb and po bicarb.  04-06-2024 Serum bicarb up to 35 on IV bicarb gtts. Bicarb gtts stopped. Continue po bicarb  04-07-2024 resolved. Serum bicarb up to 34. Will have pt hold po bicarb and have repeat BMET in PCP office in 1 week to see if she needs continue treatment with po bicarb. Pt does not have any hx of COPD or chronic lung disease. No reason to suspect she had PCO2 retention.  Hyponatremia 04-05-2024 hyponatremia is due to hypovolemia.  04-06-2024 much improved. Na 132 now.  04-07-2024 resolved. Discharge Na of 134.  Hypokalemia 04-06-2024 replete with po kcl  04-07-2024 resolved. DC K of 4.7  Glaucoma 04-05-2024 Continue with Xalatan   04-07-2024 stable.  Elevated LFTs On admission. Please minimal, appears to be chronic on outpatient lab review.  Outpatient workup  04-05-2024 stable.  04-07-2024 stable.   Ileostomy in place [facility]) 04-05-2024 stable.  04-06-2024 stable.   04-07-2024 stable.  Type 2 diabetes mellitus with chronic kidney disease, without long-term current use of insulin  (HCC) On admission. Given AKI, hold metformin .  Treat with insulin  sliding scale sensitive  04-05-2024 CBG stable  04-06-2024 stable.   04-07-2024 stable.  Hypomagnesemia 04-06-2024 replete with IV mg  04-07-2024 resolved. Discharge Mg of 2.2  Chronic systolic CHF (congestive heart failure) (HCC) - LVEF 35 to 40% 04-05-2024 monitor for volume overload while on IVF.  04-06-2024 stable.   04-07-2024 stable. No volume overload. On RA.   Chronic anticoagulation - on Eliquis  for chronic afib 04-05-2024 continue with Eliquis .  04-06-2024 stable.    04-07-2024 stable.  Atrial fibrillation, chronic (HCC) 04-05-2024 continue with Eliquis .  04-06-2024 stable. continue with Eliquis .  04-07-2024 stable. Continue eliquis   Hypothyroidism Continue with Synthroid   Gout Not in flare, continue with allopurinol   Gastric AVM crhonic, continue with pantoprazole     Discharge Diagnoses:  Principal Problem:   Acute kidney injury superimposed on stage 3a chronic kidney disease (HCC) - baseline scr 1.2 Active Problems:   Enteritis due to Norovirus   Hyponatremia   Chronic metabolic acidosis   Atrial fibrillation, chronic (HCC)   Chronic anticoagulation - on Eliquis  for chronic afib   Chronic systolic CHF (congestive heart failure) (HCC) - LVEF 35 to 40%   Hypomagnesemia   Type 2 diabetes mellitus with chronic kidney disease, without long-term current use of insulin  (HCC)   Ileostomy in place [facility])   Elevated LFTs   Glaucoma   Hypokalemia   Discharge Instructions  Discharge Instructions     Call MD for:  difficulty breathing, headache or visual disturbances   Complete by: As directed    Call MD for:  extreme fatigue   Complete by: As directed    Call MD for:  hives   Complete by: As directed    Call MD for:  persistant dizziness or light-headedness   Complete by: As directed    Call MD for:  persistant nausea and vomiting   Complete by:  As directed    Call MD for:  redness, tenderness, or signs of infection (pain, swelling, redness, odor or green/yellow discharge around incision site)   Complete by: As directed    Call MD for:  severe uncontrolled pain   Complete by: As directed    Call MD for:  temperature >100.4   Complete by: As directed    Diet - low sodium heart healthy   Complete by: As directed    Diet Carb Modified   Complete by: As directed    Discharge instructions   Complete by: As directed    1. Follow up with your primary care provider in 1-2 weeks following discharge from hospital.   Increase  activity slowly   Complete by: As directed       Allergies as of 04/07/2024       Reactions   Vioxx [rofecoxib] Swelling        Medication List     PAUSE taking these medications    sodium bicarbonate  650 MG tablet Wait to take this until your doctor or other care provider tells you to start again. Hold until seen by your PCP in 1 week Take 1 tablet (650 mg total) by mouth 3 (three) times daily.       STOP taking these medications    Jardiance  10 MG Tabs tablet Generic drug: empagliflozin        TAKE these medications    albuterol  108 (90 Base) MCG/ACT inhaler Commonly known as: VENTOLIN  HFA Inhale 2 puffs into the lungs every 6 (six) hours as needed for wheezing or shortness of breath.   allopurinol  300 MG tablet Commonly known as: ZYLOPRIM  Take 300 mg by mouth daily.   apixaban  2.5 MG Tabs tablet Commonly known as: ELIQUIS  Take 1 tablet (2.5 mg total) by mouth 2 (two) times daily.   ascorbic acid  500 MG tablet Commonly known as: VITAMIN C  Take 1 tablet (500 mg total) by mouth daily.   colchicine  0.6 MG tablet Take 0.6-1.8 mg by mouth as needed (take 1.2 mg (2 tablets) at onset of gout flare, Take one additional tablet one hour later if symptoms persist. Maximum of 3 tablets per day).   iron  polysaccharides 150 MG capsule Commonly known as: NIFEREX Take 1 capsule (150 mg total) by mouth daily.   latanoprost  0.005 % ophthalmic solution Commonly known as: XALATAN  Place 1 drop into both eyes at bedtime.   levothyroxine  50 MCG tablet Commonly known as: SYNTHROID  Take 50 mcg by mouth daily before breakfast.   loperamide  2 MG tablet Commonly known as: Imodium  A-D Take 2 tablets (4 mg total) by mouth 4 (four) times daily as needed for diarrhea or loose stools.   metFORMIN  1000 MG tablet Commonly known as: GLUCOPHAGE  Take 1,000 mg by mouth 2 (two) times daily with a meal.   midodrine  10 MG tablet Commonly known as: PROAMATINE  Take 1 tablet (10 mg  total) by mouth 3 (three) times daily with meals. What changed: how much to take   ondansetron  4 MG tablet Commonly known as: Zofran  Take 1 tablet (4 mg total) by mouth every 6 (six) hours as needed for nausea or vomiting.   pantoprazole  40 MG tablet Commonly known as: PROTONIX  Take 40 mg by mouth daily.   potassium chloride  10 MEQ tablet Commonly known as: KLOR-CON  Take 1 tablet (10 mEq total) by mouth daily as needed (if you need to take torsemide , also take potassium).   rosuvastatin  5 MG tablet Commonly known as: CRESTOR   Take 5 mg by mouth daily.   spironolactone  25 MG tablet Commonly known as: ALDACTONE  Take 0.5 tablets (12.5 mg total) by mouth daily.   torsemide  20 MG tablet Commonly known as: DEMADEX  Take 1 tablet (20 mg total) by mouth daily as needed (for weight gain or swelling).        Allergies  Allergen Reactions   Vioxx [Rofecoxib] Swelling    Discharge Exam: Vitals:   04/07/24 0315 04/07/24 0730  BP: 107/68 (!) 106/55  Pulse: 68 75  Resp: 20   Temp: 97.7 F (36.5 C) 97.8 F (36.6 C)  SpO2: 100% 100%    Physical Exam Vitals and nursing note reviewed.  Constitutional:      General: She is not in acute distress.    Appearance: She is normal weight. She is not toxic-appearing or diaphoretic.  HENT:     Head: Normocephalic and atraumatic.     Nose: Nose normal.  Eyes:     General: No scleral icterus. Pulmonary:     Effort: Pulmonary effort is normal. No respiratory distress.  Abdominal:     General: Abdomen is flat.     Comments: Ileostomy with brown liquid stool. No blood  Skin:    General: Skin is warm and dry.     Capillary Refill: Capillary refill takes less than 2 seconds.  Neurological:     General: No focal deficit present.     Mental Status: She is alert and oriented to person, place, and time.     The results of significant diagnostics from this hospitalization (including imaging, microbiology, ancillary and laboratory) are  listed below for reference.    Microbiology: Recent Results (from the past 240 hours)  C Difficile Quick Screen w PCR reflex     Status: None   Collection Time: 04/05/24  6:07 AM   Specimen: STOOL  Result Value Ref Range Status   C Diff antigen NEGATIVE NEGATIVE Final   C Diff toxin NEGATIVE NEGATIVE Final   C Diff interpretation No C. difficile detected.  Final    Comment: Performed at [facility], [address] [place], [place] [address]  Gastrointestinal Panel by PCR , Stool     Status: Abnormal   Collection Time: 04/05/24  6:07 AM   Specimen: STOOL  Result Value Ref Range Status   Campylobacter species NOT DETECTED NOT DETECTED Final   Plesimonas shigelloides NOT DETECTED NOT DETECTED Final   Salmonella species NOT DETECTED NOT DETECTED Final   Yersinia enterocolitica NOT DETECTED NOT DETECTED Final   Vibrio species NOT DETECTED NOT DETECTED Final   Vibrio cholerae NOT DETECTED NOT DETECTED Final   Enteroaggregative E coli (EAEC) NOT DETECTED NOT DETECTED Final   Enteropathogenic E coli (EPEC) NOT DETECTED NOT DETECTED Final   Enterotoxigenic E coli (ETEC) NOT DETECTED NOT DETECTED Final   Shiga like toxin producing E coli (STEC) NOT DETECTED NOT DETECTED Final   Shigella/Enteroinvasive E coli (EIEC) NOT DETECTED NOT DETECTED Final   Cryptosporidium NOT DETECTED NOT DETECTED Final   Cyclospora cayetanensis NOT DETECTED NOT DETECTED Final   Entamoeba histolytica NOT DETECTED NOT DETECTED Final   Giardia lamblia NOT DETECTED NOT DETECTED Final   Adenovirus F40/41 NOT DETECTED NOT DETECTED Final   Astrovirus NOT DETECTED NOT DETECTED Final   Norovirus GI/GII DETECTED (A) NOT DETECTED Final    Comment: RESULT CALLED TO, READ BACK BY AND VERIFIED WITH: ASHLEY DAVIS AT 0740 04/05/24.PMF    Rotavirus A NOT DETECTED NOT DETECTED Final  Sapovirus (I, II, IV, and V) NOT DETECTED NOT DETECTED Final    Comment: Performed at [facility], [address]  [place], [place] [address]     Labs: BNP (last 3 results) Recent Labs    02/28/24 0156 03/07/24 0507 03/17/24 1723  BNP 579.8* 936.6* 166.8*   Basic Metabolic Panel: Recent Labs  Lab 04/04/24 2017 04/04/24 2237 04/05/24 0629 04/06/24 0452 04/07/24 0458  NA 128* 128* 129* 132* 134*  K 5.6* 4.6 3.9 2.8* 4.7  CL 102 106 101 85* 91*  CO2 13* 14* 18* 35* 34*  GLUCOSE 100* 149* 158* 344* 122*  BUN 40* 38* 33* 18 15  CREATININE 2.11* 1.73* 1.29* 0.73 0.68  CALCIUM  9.5 9.2 9.2 8.0* 9.1  MG  --   --   --  1.1* 2.2   Liver Function Tests: Recent Labs  Lab 04/04/24 1404  AST 51*  ALT 58*  ALKPHOS 275*  BILITOT 0.8  PROT 8.2*  ALBUMIN  4.4   CBC: Recent Labs  Lab 04/04/24 2045 04/05/24 0629 04/06/24 0452 04/06/24 1215 04/07/24 0458  WBC 9.3 5.3 2.3* 3.7* 2.8*  NEUTROABS 7.3  --  1.5* 2.9 1.7  HGB 11.8* 10.7* 9.3* 10.4* 10.5*  HCT 36.2 33.2* 27.4* 30.4* 31.2*  MCV 102.0* 100.3* 97.2 96.5 99.7  PLT 98* 73* 58* 82* 73*   CBG: Recent Labs  Lab 04/06/24 0823 04/06/24 1138 04/06/24 1643 04/06/24 2103 04/07/24 0737  GLUCAP 164* 170* 135* 151* 107*   Urinalysis    Component Value Date/Time   COLORURINE YELLOW (A) 04/05/2024 0331   APPEARANCEUR CLEAR (A) 04/05/2024 0331   LABSPEC 1.006 04/05/2024 0331   PHURINE 5.0 04/05/2024 0331   GLUCOSEU NEGATIVE 04/05/2024 0331   HGBUR NEGATIVE 04/05/2024 0331   BILIRUBINUR NEGATIVE 04/05/2024 0331   KETONESUR NEGATIVE 04/05/2024 0331   PROTEINUR NEGATIVE 04/05/2024 0331   NITRITE NEGATIVE 04/05/2024 0331   LEUKOCYTESUR TRACE (A) 04/05/2024 0331   Sepsis Labs Recent Labs  Lab 04/05/24 0629 04/06/24 0452 04/06/24 1215 04/07/24 0458  WBC 5.3 2.3* 3.7* 2.8*    Procedures/Studies: US  ABDOMEN LIMITED RUQ (LIVER/GB) Result Date: 03/28/2024 CLINICAL DATA:  Transaminitis. EXAM: ULTRASOUND ABDOMEN LIMITED RIGHT UPPER QUADRANT COMPARISON:  January 05, 2023 FINDINGS: Gallbladder: Multiple shadowing, echogenic gallstones are  seen within the lumen of a contracted gallbladder (the largest measures 1.1 cm). The gallbladder wall measures 2.1 mm in thickness. No sonographic Murphy sign noted by sonographer. Common bile duct: Diameter: 3.6 mm Liver: No focal lesion identified. The liver parenchyma is nodular in contour and heterogeneous in echotexture. Portal vein is patent on color Doppler imaging with normal direction of blood flow towards the liver. Other: Limited study secondary to overlying bowel gas, as per the ultrasound technologist. IMPRESSION: 1. Cholelithiasis. 2. Hepatic cirrhosis. Electronically Signed   By: Virgle Grime M.D.   On: 03/28/2024 19:29   CT ABDOMEN PELVIS WO CONTRAST Result Date: 03/17/2024 CLINICAL DATA:  Postop emesis.  Recent colectomy.  Abdominal pain. EXAM: CT ABDOMEN AND PELVIS WITHOUT CONTRAST TECHNIQUE: Multidetector CT imaging of the abdomen and pelvis was performed following the standard protocol without IV contrast. RADIATION DOSE REDUCTION: This exam was performed according to the departmental dose-optimization program which includes automated exposure control, adjustment of the mA and/or kV according to patient size and/or use of iterative reconstruction technique. COMPARISON:  CT abdomen pelvis 02/27/2024 FINDINGS: Lower chest: Cardiomegaly.  No acute abnormality. Hepatobiliary: Cholelithiasis without evidence of acute cholecystitis. Unremarkable noncontrast appearance of the liver. Pancreas: Unremarkable.  Spleen: Unremarkable. Adrenals/Urinary Tract: Stable adrenal glands. No urinary calculi or hydronephrosis. Unremarkable bladder. 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 Right lower quadrant ileostomy. Enteric contrast is visualized within the stomach and small bowel. No bowel wall thickening or obstruction. Vascular/Lymphatic: Aortic atherosclerosis. No enlarged abdominal or pelvic lymph nodes. Reproductive: Uterus and bilateral adnexa are unremarkable. Other:  Small volume free fluid in the pelvis is likely postoperative. No free intraperitoneal air. Musculoskeletal: No acute fracture. IMPRESSION: 1. 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 No bowel obstruction. 2. Small volume free fluid in the pelvis is likely postoperative. 3. Cholelithiasis without evidence of acute cholecystitis. 4. Aortic Atherosclerosis (ICD10-I70.0). Electronically Signed   By: Rozell Cornet M.D.   On: 03/17/2024 20:45    Time coordinating discharge: 50 mins  SIGNED:  Unk Garb, DO [facility] 04/07/24, 8:48 AM

## 2024-04-07 NOTE — Progress Notes (Signed)
 PROGRESS NOTE    Tammy Arias  WUJ:[id number] DOB: 01/15/1956 DOA: 04/04/2024 PCP: Lyle San, MD  Subjective: Pt seen and examined. Ostomy output normal. Walked 400 feet yesterday with PT. Eating well. No concerns. Ready to go home today.   Hospital Course: HPI:  Tammy Arias is a 68 y.o. female with medical history significant of CHF, osteoarthritis, atrial fibrillation, coronary artery disease, HFrEF, GERD, hypertension and hypothyroidism as well as type 2 diabetes mellitus, s/p exploratory laparotomy, total colectomy, and end ileostomy creation for septic shock secondary to C diff colitis in April 2025 . [facility] last hospitalist just 6 days ago for  "acute onset of diminished appetite and feeling dehydrated as well as vomiting today. " DC 03/31/2024     Patient reports being in her usual state of health until about 2 days ago when she reports a new onset of marked green-colored liquid stool output from the ileostomy requiring emptying the bag several times a day.  Not associated with any melena or bleeding.  Not associated with any vomiting or abdominal pain or fevers.  However it was associated with marked reduced appetite and some nausea.   Patient became generally weak and fatigued.  Presented to surgical clinic earlier today where she had labs done.  Notable for moderate hyponatremia hyperkalemia acidosis as well as AKI.  Patient is sent to the ER medical evaluation is sought. [facility] remains vitally stable (she has chronic hypotension for wich she take midodrine )  Significant Events: Admitted 04/04/2024 for AKI   Significant Labs: Na 128, K 5.6, CO2 of 13, BUN 40, Scr 2.11, glu 100 WBC 9.3, HgB 11.8, plt 98 Serum osm 285 Urine Na < 10  Significant Imaging Studies:   Antibiotic Therapy: Anti-infectives (From admission, onward)    None       Procedures:   Consultants:     Assessment and Plan: * Acute kidney injury superimposed on stage 3a chronic kidney  disease (HCC) - baseline scr 1.2 On admission. At this time patient is pending urine studies including urine sodium, however given the entirety of the clinical picture this is most consistent with prerenal AKI with possibility of ATN on top.  This is complicated by moderate asymptomatic hyponatremia, mild hyperkalemia without peaked T waves on the EKG. Treating with saline bolus in the ER.  Continue with sodium bicarbonate  infusion IV got Lokelma  once.  Will trend laboratory markers. And urine output.. hold spironolactone  and lasix .  04-05-2024 Scr 1.29 today. Scr was 1.73 yesterday. AKI improving with IVF. AKI due to gastroenteritis caused by Norovirus.  04-06-2024 resolved. Scr normal now at 0.73. stop IV bicarb gtts. Change to IV LR. Monitoring ileostomy output. Potential for DC to home tomorrow.  04-07-2024 resolved. Scr 0.68. Stable for DC to home.  Enteritis due to Norovirus On admission. See HPI for clinical course, check C. difficile and stool pathogen panel.  At this time do not see indication for antibiotic therapy given no fever no leukocytosis and no toxic symptoms  04-05-2024 GI panel positive for Novovirus. Continue with supportive care. Do NOT give any anti-diarrheals.  04-06-2024 high output from ileostomy has resolved. Scr normal now at 0.73. stop IV bicarb gtts. Change to IV LR. Monitoring ileostomy output. Potential for DC to home tomorrow.  04-07-2024 resolved. Stable for DC to home.  Chronic metabolic acidosis On admission. Nonanion gap likely due to enteric losses.  Review of labs shows that this is chronic, uncontrolled.  Will add sodium bicarbonate  infusion.  To oral  sodium bicarbonate  therapy  04-05-2024 pt has acute on chronic metabolic acidosis. Chronic acidosis due to ongoing bicarbonate losses from her ileostomy. Now exacerbated due to more output from ileostom from Novorovirus. Continue with IV bicarb and po bicarb.  04-06-2024 Serum bicarb up to 35 on IV bicarb  gtts. Bicarb gtts stopped. Continue po bicarb  04-07-2024 resolved. Serum bicarb up to 34. Will have pt hold po bicarb and have repeat BMET in PCP office in 1 week to see if she needs continue treatment with po bicarb. Pt does not have any hx of COPD or chronic lung disease. No reason to suspect she had PCO2 retention.  Hyponatremia 04-05-2024 hyponatremia is due to hypovolemia.  04-06-2024 much improved. Na 132 now.  04-07-2024 resolved. Discharge Na of 134.  Hypokalemia 04-06-2024 replete with po kcl  04-07-2024 resolved. DC K of 4.7  Glaucoma 04-05-2024 Continue with Xalatan   04-07-2024 stable.  Elevated LFTs On admission. Please minimal, appears to be chronic on outpatient lab review.  Outpatient workup  04-05-2024 stable.  04-07-2024 stable.   Ileostomy in place [facility]) 04-05-2024 stable.  04-06-2024 stable.   04-07-2024 stable.  Type 2 diabetes mellitus with chronic kidney disease, without long-term current use of insulin  (HCC) On admission. Given AKI, hold metformin .  Treat with insulin  sliding scale sensitive  04-05-2024 CBG stable  04-06-2024 stable.   04-07-2024 stable.  Hypomagnesemia 04-06-2024 replete with IV mg  04-07-2024 resolved. Discharge Mg of 2.2  Chronic systolic CHF (congestive heart failure) (HCC) - LVEF 35 to 40% 04-05-2024 monitor for volume overload while on IVF.  04-06-2024 stable.   04-07-2024 stable. No volume overload. On RA.   Chronic anticoagulation - on Eliquis  for chronic afib 04-05-2024 continue with Eliquis .  04-06-2024 stable.   04-07-2024 stable.  Atrial fibrillation, chronic (HCC) 04-05-2024 continue with Eliquis .  04-06-2024 stable. continue with Eliquis .  04-07-2024 stable. Continue eliquis   Hypothyroidism Continue with Synthroid   Gout Not in flare, continue with allopurinol   Gastric AVM crhonic, continue with pantoprazole    DVT prophylaxis: apixaban  (ELIQUIS ) tablet 2.5 mg Start: 04/04/24  2200 SCDs Start: 04/04/24 1906 apixaban  (ELIQUIS ) tablet 2.5 mg     Code Status: Full Code Family Communication: no family at bedside. Pt is decisional. Disposition Plan: return home Reason for continuing need for hospitalization: stable for DC.  Objective: Vitals:   04/06/24 1520 04/06/24 2141 04/07/24 0315 04/07/24 0730  BP: (!) 105/58 103/74 107/68 (!) 106/55  Pulse: 84 79 68 75  Resp: 19 20 20    Temp: 98.2 F (36.8 C) 98.3 F (36.8 C) 97.7 F (36.5 C) 97.8 F (36.6 C)  TempSrc: Oral Oral Oral Oral  SpO2: 99% 96% 100% 100%  Weight:      Height:        Intake/Output Summary (Last 24 hours) at 04/07/2024 0846 Last data filed at 04/07/2024 0320 Gross per 24 hour  Intake 609.86 ml  Output 425 ml  Net 184.86 ml   Filed Weights   04/04/24 1625  Weight: 49.9 kg    Examination:  Physical Exam Vitals and nursing note reviewed.  Constitutional:      General: She is not in acute distress.    Appearance: She is normal weight. She is not toxic-appearing or diaphoretic.  HENT:     Head: Normocephalic and atraumatic.     Nose: Nose normal.  Eyes:     General: No scleral icterus. Pulmonary:     Effort: Pulmonary effort is normal. No respiratory distress.  Abdominal:  General: Abdomen is flat.     Comments: Ileostomy with brown liquid stool. No blood  Skin:    General: Skin is warm and dry.     Capillary Refill: Capillary refill takes less than 2 seconds.  Neurological:     General: No focal deficit present.     Mental Status: She is alert and oriented to person, place, and time.     Data Reviewed: I have personally reviewed following labs and imaging studies  CBC: Recent Labs  Lab 04/04/24 2045 04/05/24 0629 04/06/24 0452 04/06/24 1215 04/07/24 0458  WBC 9.3 5.3 2.3* 3.7* 2.8*  NEUTROABS 7.3  --  1.5* 2.9 1.7  HGB 11.8* 10.7* 9.3* 10.4* 10.5*  HCT 36.2 33.2* 27.4* 30.4* 31.2*  MCV 102.0* 100.3* 97.2 96.5 99.7  PLT 98* 73* 58* 82* 73*   Basic  Metabolic Panel: Recent Labs  Lab 04/04/24 2017 04/04/24 2237 04/05/24 0629 04/06/24 0452 04/07/24 0458  NA 128* 128* 129* 132* 134*  K 5.6* 4.6 3.9 2.8* 4.7  CL 102 106 101 85* 91*  CO2 13* 14* 18* 35* 34*  GLUCOSE 100* 149* 158* 344* 122*  BUN 40* 38* 33* 18 15  CREATININE 2.11* 1.73* 1.29* 0.73 0.68  CALCIUM  9.5 9.2 9.2 8.0* 9.1  MG  --   --   --  1.1* 2.2   GFR: Estimated Creatinine Clearance: 50.1 mL/min (by C-G formula based on SCr of 0.68 mg/dL). Liver Function Tests: Recent Labs  Lab 04/04/24 1404  AST 51*  ALT 58*  ALKPHOS 275*  BILITOT 0.8  PROT 8.2*  ALBUMIN  4.4   Coagulation Profile: Recent Labs  Lab 04/05/24 0629  INR 1.5*   BNP (last 3 results) Recent Labs    02/28/24 0156 03/07/24 0507 03/17/24 1723  BNP 579.8* 936.6* 166.8*   CBG: Recent Labs  Lab 04/06/24 0823 04/06/24 1138 04/06/24 1643 04/06/24 2103 04/07/24 0737  GLUCAP 164* 170* 135* 151* 107*    Recent Results (from the past 240 hours)  C Difficile Quick Screen w PCR reflex     Status: None   Collection Time: 04/05/24  6:07 AM   Specimen: STOOL  Result Value Ref Range Status   C Diff antigen NEGATIVE NEGATIVE Final   C Diff toxin NEGATIVE NEGATIVE Final   C Diff interpretation No C. difficile detected.  Final    Comment: Performed at [facility], [address] [place], [place] [address]  Gastrointestinal Panel by PCR , Stool     Status: Abnormal   Collection Time: 04/05/24  6:07 AM   Specimen: STOOL  Result Value Ref Range Status   Campylobacter species NOT DETECTED NOT DETECTED Final   Plesimonas shigelloides NOT DETECTED NOT DETECTED Final   Salmonella species NOT DETECTED NOT DETECTED Final   Yersinia enterocolitica NOT DETECTED NOT DETECTED Final   Vibrio species NOT DETECTED NOT DETECTED Final   Vibrio cholerae NOT DETECTED NOT DETECTED Final   Enteroaggregative E coli (EAEC) NOT DETECTED NOT DETECTED Final   Enteropathogenic E coli (EPEC) NOT DETECTED  NOT DETECTED Final   Enterotoxigenic E coli (ETEC) NOT DETECTED NOT DETECTED Final   Shiga like toxin producing E coli (STEC) NOT DETECTED NOT DETECTED Final   Shigella/Enteroinvasive E coli (EIEC) NOT DETECTED NOT DETECTED Final   Cryptosporidium NOT DETECTED NOT DETECTED Final   Cyclospora cayetanensis NOT DETECTED NOT DETECTED Final   Entamoeba histolytica NOT DETECTED NOT DETECTED Final   Giardia lamblia NOT DETECTED NOT DETECTED Final  Adenovirus F40/41 NOT DETECTED NOT DETECTED Final   Astrovirus NOT DETECTED NOT DETECTED Final   Norovirus GI/GII DETECTED (A) NOT DETECTED Final    Comment: RESULT CALLED TO, READ BACK BY AND VERIFIED WITH: ASHLEY DAVIS AT 0740 04/05/24.PMF    Rotavirus A NOT DETECTED NOT DETECTED Final   Sapovirus (I, II, IV, and V) NOT DETECTED NOT DETECTED Final    Comment: Performed at [facility], [address] [place], [place] [address]     Scheduled Meds:  allopurinol   300 mg Oral Daily   apixaban   2.5 mg Oral BID   insulin  aspart  0-5 Units Subcutaneous QHS   insulin  aspart  0-9 Units Subcutaneous TID WC   latanoprost   1 drop Both Eyes QHS   levothyroxine   50 mcg Oral Q0600   midodrine   5 mg Oral TID WC   pantoprazole   40 mg Oral Daily   rosuvastatin   5 mg Oral Daily   sodium bicarbonate   650 mg Oral TID   sodium chloride  flush  3 mL Intravenous Q12H   Continuous Infusions:   LOS: 3 days   Time spent: 45 minutes  Unk Garb, DO  [facility]  04/07/2024, 8:46 AM

## 2024-04-07 NOTE — Plan of Care (Signed)
 IV removed, discharge instructions reviewed and patient discharged to home with friend

## 2024-04-07 NOTE — Plan of Care (Signed)
  Problem: Coping: Goal: Ability to adjust to condition or change in health will improve Outcome: Progressing   Problem: Nutritional: Goal: Maintenance of adequate nutrition will improve Outcome: Progressing   Problem: Skin Integrity: Goal: Risk for impaired skin integrity will decrease Outcome: Progressing   Problem: Education: Goal: Knowledge of General Education information will improve Description: Including pain rating scale, medication(s)/side effects and non-pharmacologic comfort measures Outcome: Progressing   Problem: Coping: Goal: Level of anxiety will decrease Outcome: Progressing   Problem: Elimination: Goal: Will not experience complications related to bowel motility Outcome: Progressing Goal: Will not experience complications related to urinary retention Outcome: Progressing   Problem: Pain Managment: Goal: General experience of comfort will improve and/or be controlled Outcome: Progressing   Problem: Skin Integrity: Goal: Risk for impaired skin integrity will decrease Outcome: Progressing

## 2024-04-08 ENCOUNTER — Ambulatory Visit (HOSPITAL_COMMUNITY)
Admit: 2024-04-08 | Discharge: 2024-04-08 | Disposition: A | Discharge status: Home / Self Care | Source: Ambulatory Visit | Attending: *Deleted | Admitting: *Deleted

## 2024-04-08 DIAGNOSIS — Z432 Encounter for attention to ileostomy: Secondary | ICD-10-CM | POA: Diagnosis not present

## 2024-04-08 DIAGNOSIS — E860 Dehydration: Secondary | ICD-10-CM | POA: Insufficient documentation

## 2024-04-08 DIAGNOSIS — Z932 Ileostomy status: Secondary | ICD-10-CM | POA: Diagnosis present

## 2024-04-08 NOTE — Progress Notes (Signed)
 [facility]   Reason for visit:  RLQ ileostomy, has had some leaks at home.  HPI:  Pancolitis with sepsis and end ileostomy dehydration Past Medical History:  Diagnosis Date   AC (acromioclavicular) joint bone spurs, right    Anemia    Arthritis    Atrial fibrillation (HCC)    a.) CHA2DS2-VASc = 7 (age, sex, HFrEF, HTN, TIA x 2, T2DM). b.) rate/rhythm controlled on oral carvedilol ; chronically anticoagulated with warfarin.   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 b.) R/LHC 03/29/2017: small RCA with occluded SVG; no significant Dz. Chronically occluded LAD with patent SVG to D1/LAD. Insignificant Dz in LCx. LM normal.   Cortical cataract    DOE (dyspnea on exertion)    DOE (dyspnea on exertion)    GERD (gastroesophageal reflux disease)    HFrEF (heart failure with reduced ejection fraction) (HCC)    a.) TTE 10/27/2014: EF 40%; glob HK; mild BAE; triv AR, mild MR/PR, mod TR.  b.) TTE 03/15/2017: EF 30%; glob HK; mod BAE; mod pHTN (RVSP 54.8 mmHg); mild PR, mod MR; sev TR.  c.) R/LHC 03/29/2017: EF 30-35%. d.)TTE 10/25/2018: EF 35%; mod BAE; mod BVE; glob HK; triv PR, mod MR, sev TR; RVSP 57.7 mmHg; G2DD.   History of shingles 2004   Hyperlipidemia    Hypertension    Hypothyroidism    IBS (irritable bowel syndrome)    Ischemic cardiomyopathy    a.) TTE 10/27/2014: EF 40%. b.) TTE 03/15/2017: EF 30%. c.) R/LHC 04/01/2017: EF 30-35%. d.) TTE 10/25/2018: EF 35%   Lower GI bleed 06/07/2023   Lumbar scoliosis    Lumbar spinal stenosis    Melena 12/17/2018   Migraines    Osteoporosis    Pancolitis (HCC) 02/28/2024   Pulmonary HTN (HCC)    a.) TTE 03/15/2017: EF 30-35%; RVSP 54.8 mmHg. 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   Sleep apnea    T2DM (type 2 diabetes mellitus) (HCC)    TIA (transient ischemic attack) 05/29/2016   Vitamin B 12 deficiency    Family History  Problem Relation Age of Onset   Valvular heart disease Mother    Diabetes Father    Cancer Sister        lung   Cancer Sister        cervical   Breast cancer Cousin    Allergies  Allergen Reactions   Vioxx [Rofecoxib] Swelling   Current Outpatient Medications  Medication Sig Dispense Refill Last Dose/Taking   albuterol  (PROVENTIL  HFA;VENTOLIN  HFA) 108 (90 Base) MCG/ACT inhaler Inhale 2 puffs into the lungs every 6 (six) hours as needed for wheezing or shortness of breath.      allopurinol  (ZYLOPRIM ) 300 MG tablet Take 300 mg by mouth daily.      apixaban  (ELIQUIS ) 2.5 MG TABS tablet Take 1 tablet (2.5 mg total) by mouth 2 (two) times daily.      ascorbic acid  (VITAMIN C ) 500 MG tablet Take 1 tablet (500 mg total) by mouth daily. 30 tablet 2    colchicine  0.6 MG tablet Take 0.6-1.8 mg by mouth as needed (  take 1.2 mg (2 tablets) at onset of gout flare, Take one additional tablet one hour later if symptoms persist. Maximum of 3 tablets per day). (Patient not taking: Reported on 04/04/2024)      iron  polysaccharides (NIFEREX) 150 MG capsule Take 1 capsule (150 mg total) by mouth daily. 30 capsule 2    latanoprost  (XALATAN ) 0.005 % ophthalmic solution Place 1 drop into both eyes at bedtime.      levothyroxine  (SYNTHROID ) 50 MCG tablet Take 50 mcg by mouth daily before breakfast.      loperamide  (IMODIUM  A-D) 2 MG tablet Take 2 tablets (4 mg total) by mouth 4 (four) times daily as needed for diarrhea or loose stools. 100 tablet 2    metFORMIN  (GLUCOPHAGE ) 1000 MG tablet Take 1,000 mg by mouth 2 (two) times daily with a meal.      midodrine  (PROAMATINE ) 10 MG tablet Take 1 tablet (10 mg total) by mouth 3 (three) times daily with meals. (Patient taking differently: Take 5 mg by mouth 3 (three)  times daily with meals.) 90 tablet 2    ondansetron  (ZOFRAN ) 4 MG tablet Take 1 tablet (4 mg total) by mouth every 6 (six) hours as needed for nausea or vomiting. 30 tablet 0    pantoprazole  (PROTONIX ) 40 MG tablet Take 40 mg by mouth daily.      potassium chloride  (KLOR-CON ) 10 MEQ tablet Take 1 tablet (10 mEq total) by mouth daily as needed (if you need to take torsemide , also take potassium). (Patient not taking: Reported on 04/04/2024)      rosuvastatin  (CRESTOR ) 5 MG tablet Take 5 mg by mouth daily.      [Paused] sodium bicarbonate  650 MG tablet Take 1 tablet (650 mg total) by mouth 3 (three) times daily. 30 tablet 0    spironolactone  (ALDACTONE ) 25 MG tablet Take 0.5 tablets (12.5 mg total) by mouth daily.      torsemide  (DEMADEX ) 20 MG tablet Take 1 tablet (20 mg total) by mouth daily as needed (for weight gain or swelling). (Patient not taking: Reported on 04/04/2024)      No current facility-administered medications for this encounter.   ROS  Review of Systems  Constitutional:  Positive for fatigue.  Cardiovascular: Negative.   Gastrointestinal:        RLQ ileostomy  Skin:  Positive for color change and rash. Negative for pallor.  Neurological:  Negative for dizziness.  Psychiatric/Behavioral: Negative.    All other systems reviewed and are negative.  Vital signs:  BP (P) 133/74 (BP Location: Right Arm)   Pulse (P) 85   Temp (P) 97.9 F (36.6 C) (Oral)   Resp (P) 18   SpO2 (P) 98%  Exam:  Physical Exam Vitals reviewed.  Constitutional:      Appearance: Normal appearance.  HENT:     Mouth/Throat:     Mouth: Mucous membranes are moist.  Cardiovascular:     Rate and Rhythm: Normal rate and regular rhythm.     Pulses: Normal pulses.     Heart sounds: Normal heart sounds.  Pulmonary:     Breath sounds: Normal breath sounds.  Abdominal:     Palpations: Abdomen is soft.  Skin:    General: Skin is warm and dry.     Comments: Peristomal breakdown noted from leaks.    Neurological:     General: No focal deficit present.     Mental Status: She is alert and oriented to person, place, and time. Mental status is at  baseline.  Psychiatric:        Mood and Affect: Mood normal.        Behavior: Behavior normal.     Stoma type/location:  RLQ ileostomy with creasing at 3 o'clock Stomal assessment/size:  1 3/4" slightly budded Peristomal assessment:  creasing at 3 o'clock  denuded skin along bottom half of peristomal skin.  Treatment options for stomal/peristomal skin: stoma powder and skin prep  piece of barrier ring in crease and remainder around stoma.   Output: liquid brown stool Ostomy pouching: 2 pc. Convex with barrier ring  Adding barrier strips to perimeter to provide more secure fit.  Adding ostomy belt.  Education provided:  performed pouch change with above mentioned interventions.  Explained rationale.  Is drinking more water  and electrolyte replacement drinks.       Impression/dx  Ileostomy dehydration Discussion  Discussed risk of dehydration, diet with ileostomy, risk of blockage and hernia.  Samples provided for above mentioned interventions  Plan  See back 1 week..  No dizziness or dry mouth noted today.  She reports her urine is clear and yellow     Visit time: 55 minutes.   Branda Cain FNP-BC

## 2024-04-14 DIAGNOSIS — E860 Dehydration: Secondary | ICD-10-CM | POA: Insufficient documentation

## 2024-04-14 DIAGNOSIS — Z432 Encounter for attention to ileostomy: Secondary | ICD-10-CM | POA: Insufficient documentation

## 2024-04-14 DIAGNOSIS — L24B3 Irritant contact dermatitis related to fecal or urinary stoma or fistula: Secondary | ICD-10-CM | POA: Insufficient documentation

## 2024-04-23 ENCOUNTER — Other Ambulatory Visit (HOSPITAL_COMMUNITY): Payer: Self-pay | Admitting: Nurse Practitioner

## 2024-04-23 DIAGNOSIS — Z432 Encounter for attention to ileostomy: Secondary | ICD-10-CM

## 2024-04-23 DIAGNOSIS — L24B3 Irritant contact dermatitis related to fecal or urinary stoma or fistula: Secondary | ICD-10-CM

## 2024-06-05 ENCOUNTER — Other Ambulatory Visit: Payer: Self-pay | Admitting: Family Medicine

## 2024-06-05 ENCOUNTER — Telehealth: Payer: Self-pay | Admitting: Surgery

## 2024-06-05 DIAGNOSIS — Z1231 Encounter for screening mammogram for malignant neoplasm of breast: Secondary | ICD-10-CM

## 2024-06-05 NOTE — Telephone Encounter (Signed)
 Patient calls.  She is status post total colectomy with ileostomy in place.  She said that on 06/01/24 when changing the bag might have rubbed a bit too hard, stoma started bleeding.  Last night she said the bag was leaking and had some blood in it.  Today, the bleeding has stopped. She is calling to make sure there is nothing to worry about or if needs to come in for it to be checked.  Please call patient.

## 2024-06-07 ENCOUNTER — Inpatient Hospital Stay
Admission: EM | Admit: 2024-06-07 | Discharge: 2024-06-12 | DRG: 441 | Disposition: A | Discharge status: Home / Self Care | Attending: Internal Medicine | Admitting: Internal Medicine

## 2024-06-07 ENCOUNTER — Other Ambulatory Visit: Payer: Self-pay

## 2024-06-07 DIAGNOSIS — Z7901 Long term (current) use of anticoagulants: Secondary | ICD-10-CM

## 2024-06-07 DIAGNOSIS — Z9841 Cataract extraction status, right eye: Secondary | ICD-10-CM

## 2024-06-07 DIAGNOSIS — G4733 Obstructive sleep apnea (adult) (pediatric): Secondary | ICD-10-CM | POA: Diagnosis present

## 2024-06-07 DIAGNOSIS — I130 Hypertensive heart and chronic kidney disease with heart failure and stage 1 through stage 4 chronic kidney disease, or unspecified chronic kidney disease: Secondary | ICD-10-CM | POA: Diagnosis present

## 2024-06-07 DIAGNOSIS — Z79890 Hormone replacement therapy (postmenopausal): Secondary | ICD-10-CM

## 2024-06-07 DIAGNOSIS — E039 Hypothyroidism, unspecified: Secondary | ICD-10-CM | POA: Diagnosis present

## 2024-06-07 DIAGNOSIS — E860 Dehydration: Secondary | ICD-10-CM | POA: Diagnosis present

## 2024-06-07 DIAGNOSIS — Z79899 Other long term (current) drug therapy: Secondary | ICD-10-CM

## 2024-06-07 DIAGNOSIS — N189 Chronic kidney disease, unspecified: Secondary | ICD-10-CM | POA: Diagnosis present

## 2024-06-07 DIAGNOSIS — I8500 Esophageal varices without bleeding: Secondary | ICD-10-CM

## 2024-06-07 DIAGNOSIS — E785 Hyperlipidemia, unspecified: Secondary | ICD-10-CM | POA: Diagnosis present

## 2024-06-07 DIAGNOSIS — Z932 Ileostomy status: Secondary | ICD-10-CM | POA: Diagnosis not present

## 2024-06-07 DIAGNOSIS — I255 Ischemic cardiomyopathy: Secondary | ICD-10-CM | POA: Diagnosis present

## 2024-06-07 DIAGNOSIS — M419 Scoliosis, unspecified: Secondary | ICD-10-CM | POA: Diagnosis present

## 2024-06-07 DIAGNOSIS — Z888 Allergy status to other drugs, medicaments and biological substances status: Secondary | ICD-10-CM

## 2024-06-07 DIAGNOSIS — K31811 Angiodysplasia of stomach and duodenum with bleeding: Secondary | ICD-10-CM | POA: Diagnosis present

## 2024-06-07 DIAGNOSIS — D509 Iron deficiency anemia, unspecified: Secondary | ICD-10-CM | POA: Diagnosis present

## 2024-06-07 DIAGNOSIS — Z7902 Long term (current) use of antithrombotics/antiplatelets: Secondary | ICD-10-CM

## 2024-06-07 DIAGNOSIS — K219 Gastro-esophageal reflux disease without esophagitis: Secondary | ICD-10-CM | POA: Diagnosis present

## 2024-06-07 DIAGNOSIS — I5022 Chronic systolic (congestive) heart failure: Secondary | ICD-10-CM | POA: Diagnosis present

## 2024-06-07 DIAGNOSIS — I2510 Atherosclerotic heart disease of native coronary artery without angina pectoris: Secondary | ICD-10-CM | POA: Diagnosis present

## 2024-06-07 DIAGNOSIS — Z8249 Family history of ischemic heart disease and other diseases of the circulatory system: Secondary | ICD-10-CM

## 2024-06-07 DIAGNOSIS — Z96651 Presence of right artificial knee joint: Secondary | ICD-10-CM | POA: Diagnosis present

## 2024-06-07 DIAGNOSIS — K7469 Other cirrhosis of liver: Secondary | ICD-10-CM | POA: Diagnosis present

## 2024-06-07 DIAGNOSIS — I272 Pulmonary hypertension, unspecified: Secondary | ICD-10-CM | POA: Diagnosis present

## 2024-06-07 DIAGNOSIS — K766 Portal hypertension: Secondary | ICD-10-CM | POA: Diagnosis present

## 2024-06-07 DIAGNOSIS — Z8719 Personal history of other diseases of the digestive system: Secondary | ICD-10-CM

## 2024-06-07 DIAGNOSIS — D61818 Other pancytopenia: Secondary | ICD-10-CM | POA: Diagnosis present

## 2024-06-07 DIAGNOSIS — D62 Acute posthemorrhagic anemia: Secondary | ICD-10-CM | POA: Diagnosis present

## 2024-06-07 DIAGNOSIS — Z951 Presence of aortocoronary bypass graft: Secondary | ICD-10-CM

## 2024-06-07 DIAGNOSIS — D696 Thrombocytopenia, unspecified: Secondary | ICD-10-CM | POA: Diagnosis present

## 2024-06-07 DIAGNOSIS — E1122 Type 2 diabetes mellitus with diabetic chronic kidney disease: Secondary | ICD-10-CM | POA: Diagnosis present

## 2024-06-07 DIAGNOSIS — I10 Essential (primary) hypertension: Secondary | ICD-10-CM | POA: Diagnosis not present

## 2024-06-07 DIAGNOSIS — Z933 Colostomy status: Secondary | ICD-10-CM

## 2024-06-07 DIAGNOSIS — Z8619 Personal history of other infectious and parasitic diseases: Secondary | ICD-10-CM

## 2024-06-07 DIAGNOSIS — I8511 Secondary esophageal varices with bleeding: Secondary | ICD-10-CM | POA: Diagnosis present

## 2024-06-07 DIAGNOSIS — Z9049 Acquired absence of other specified parts of digestive tract: Secondary | ICD-10-CM

## 2024-06-07 DIAGNOSIS — I482 Chronic atrial fibrillation, unspecified: Secondary | ICD-10-CM | POA: Diagnosis present

## 2024-06-07 DIAGNOSIS — K3189 Other diseases of stomach and duodenum: Secondary | ICD-10-CM | POA: Diagnosis not present

## 2024-06-07 DIAGNOSIS — Z7984 Long term (current) use of oral hypoglycemic drugs: Secondary | ICD-10-CM

## 2024-06-07 DIAGNOSIS — M109 Gout, unspecified: Secondary | ICD-10-CM | POA: Diagnosis present

## 2024-06-07 DIAGNOSIS — Z833 Family history of diabetes mellitus: Secondary | ICD-10-CM

## 2024-06-07 DIAGNOSIS — Z87891 Personal history of nicotine dependence: Secondary | ICD-10-CM

## 2024-06-07 DIAGNOSIS — Z8673 Personal history of transient ischemic attack (TIA), and cerebral infarction without residual deficits: Secondary | ICD-10-CM

## 2024-06-07 DIAGNOSIS — I081 Rheumatic disorders of both mitral and tricuspid valves: Secondary | ICD-10-CM | POA: Diagnosis present

## 2024-06-07 DIAGNOSIS — Z9842 Cataract extraction status, left eye: Secondary | ICD-10-CM

## 2024-06-07 DIAGNOSIS — K922 Gastrointestinal hemorrhage, unspecified: Principal | ICD-10-CM | POA: Diagnosis present

## 2024-06-07 DIAGNOSIS — K921 Melena: Secondary | ICD-10-CM | POA: Diagnosis not present

## 2024-06-07 DIAGNOSIS — K589 Irritable bowel syndrome without diarrhea: Secondary | ICD-10-CM | POA: Diagnosis present

## 2024-06-07 DIAGNOSIS — Z961 Presence of intraocular lens: Secondary | ICD-10-CM | POA: Diagnosis present

## 2024-06-07 LAB — CBC
HCT: 29.5 % — ABNORMAL LOW (ref 36.0–46.0)
HCT: 24.2 % — ABNORMAL LOW (ref 36.0–46.0)
Hemoglobin: 9.3 g/dL — ABNORMAL LOW (ref 12.0–15.0)
Hemoglobin: 7.8 g/dL — ABNORMAL LOW (ref 12.0–15.0)
MCH: 33.3 pg (ref 26.0–34.0)
MCH: 33.8 pg (ref 26.0–34.0)
MCHC: 31.5 g/dL (ref 30.0–36.0)
MCHC: 32.2 g/dL (ref 30.0–36.0)
MCV: 105.7 fL — ABNORMAL HIGH (ref 80.0–100.0)
MCV: 104.8 fL — ABNORMAL HIGH (ref 80.0–100.0)
Platelets: 105 K/uL — ABNORMAL LOW (ref 150–400)
Platelets: 93 K/uL — ABNORMAL LOW (ref 150–400)
RBC: 2.79 MIL/uL — ABNORMAL LOW (ref 3.87–5.11)
RBC: 2.31 MIL/uL — ABNORMAL LOW (ref 3.87–5.11)
RDW: 15.6 % — ABNORMAL HIGH (ref 11.5–15.5)
RDW: 15.8 % — ABNORMAL HIGH (ref 11.5–15.5)
WBC: 4.7 K/uL (ref 4.0–10.5)
WBC: 5.1 K/uL (ref 4.0–10.5)
nRBC: 0 % (ref 0.0–0.2)
nRBC: 0 % (ref 0.0–0.2)

## 2024-06-07 LAB — COMPREHENSIVE METABOLIC PANEL WITH GFR
ALT: 22 U/L (ref 0–44)
AST: 40 U/L (ref 15–41)
Albumin: 4.1 g/dL (ref 3.5–5.0)
Alkaline Phosphatase: 124 U/L (ref 38–126)
Anion gap: 10 (ref 5–15)
BUN: 19 mg/dL (ref 8–23)
CO2: 23 mmol/L (ref 22–32)
Calcium: 9.2 mg/dL (ref 8.9–10.3)
Chloride: 104 mmol/L (ref 98–111)
Creatinine, Ser: 1.02 mg/dL — ABNORMAL HIGH (ref 0.44–1.00)
GFR, Estimated: 60 mL/min — ABNORMAL LOW (ref >60)
Glucose, Bld: 128 mg/dL — ABNORMAL HIGH (ref 70–99)
Potassium: 4.3 mmol/L (ref 3.5–5.1)
Sodium: 137 mmol/L (ref 135–145)
Total Bilirubin: 0.9 mg/dL (ref 0.0–1.2)
Total Protein: 7.3 g/dL (ref 6.5–8.1)

## 2024-06-07 LAB — PROTIME-INR
INR: 1.1 (ref 0.8–1.2)
Prothrombin Time: 15.1 s (ref 11.4–15.2)

## 2024-06-07 LAB — BRAIN NATRIURETIC PEPTIDE: B Natriuretic Peptide: 125.4 pg/mL — ABNORMAL HIGH (ref 0.0–100.0)

## 2024-06-07 MED ORDER — PANTOPRAZOLE SODIUM 40 MG IV SOLR
80.0000 mg | Freq: Once | INTRAVENOUS | Status: AC
  Administered 2024-06-07: 80 mg via INTRAVENOUS
  Filled 2024-06-07: qty 20

## 2024-06-07 MED ORDER — ACETAMINOPHEN 325 MG PO TABS: 650.0000 mg | ORAL_TABLET | Freq: Four times a day (QID) | ORAL | Status: DC | PRN

## 2024-06-07 MED ORDER — PANTOPRAZOLE SODIUM 40 MG IV SOLR
40.0000 mg | Freq: Two times a day (BID) | INTRAVENOUS | Status: DC
  Administered 2024-06-08 – 2024-06-12 (×9): 40 mg via INTRAVENOUS
  Filled 2024-06-07 (×9): qty 10

## 2024-06-07 MED ORDER — MAGNESIUM OXIDE 400 MG PO TABS
400.0000 mg | ORAL_TABLET | Freq: Every day | ORAL | Status: DC
  Administered 2024-06-08 – 2024-06-12 (×4): 400 mg via ORAL
  Filled 2024-06-07 (×9): qty 1

## 2024-06-07 MED ORDER — LEVOTHYROXINE SODIUM 50 MCG PO TABS
50.0000 ug | ORAL_TABLET | Freq: Every day | ORAL | Status: DC
  Administered 2024-06-08 – 2024-06-12 (×4): 50 ug via ORAL
  Filled 2024-06-07 (×4): qty 1

## 2024-06-07 MED ORDER — INSULIN ASPART 100 UNIT/ML IJ SOLN: 0.0000 [IU] | Freq: Every day | INTRAMUSCULAR | Status: DC

## 2024-06-07 MED ORDER — ONDANSETRON HCL 4 MG/2ML IJ SOLN: 4.0000 mg | Freq: Three times a day (TID) | INTRAMUSCULAR | Status: DC | PRN

## 2024-06-07 MED ORDER — OCTREOTIDE LOAD VIA INFUSION: 50.0000 ug | Freq: Once | INTRAVENOUS | Status: DC

## 2024-06-07 MED ORDER — ALLOPURINOL 100 MG PO TABS
300.0000 mg | ORAL_TABLET | Freq: Every day | ORAL | Status: DC
  Administered 2024-06-08 – 2024-06-12 (×4): 300 mg via ORAL
  Filled 2024-06-07 (×2): qty 3
  Filled 2024-06-07: qty 1
  Filled 2024-06-07: qty 3

## 2024-06-07 MED ORDER — OCTREOTIDE LOAD VIA INFUSION
50.0000 ug | Freq: Once | INTRAVENOUS | Status: AC
  Administered 2024-06-07: 50 ug via INTRAVENOUS
  Filled 2024-06-07: qty 25

## 2024-06-07 MED ORDER — SODIUM CHLORIDE 0.9 % IV SOLN
1.0000 g | Freq: Once | INTRAVENOUS | Status: AC
  Administered 2024-06-07: 1 g via INTRAVENOUS
  Filled 2024-06-07: qty 10

## 2024-06-07 MED ORDER — POLYSACCHARIDE IRON COMPLEX 150 MG PO CAPS
150.0000 mg | ORAL_CAPSULE | Freq: Every day | ORAL | Status: DC
  Administered 2024-06-08 – 2024-06-12 (×4): 150 mg via ORAL
  Filled 2024-06-07 (×5): qty 1

## 2024-06-07 MED ORDER — SODIUM CHLORIDE 0.9 % IV SOLN
50.0000 ug/h | INTRAVENOUS | Status: AC
  Administered 2024-06-07 – 2024-06-10 (×6): 50 ug/h via INTRAVENOUS
  Filled 2024-06-07 (×8): qty 1

## 2024-06-07 MED ORDER — INSULIN ASPART 100 UNIT/ML IJ SOLN
0.0000 [IU] | Freq: Three times a day (TID) | INTRAMUSCULAR | Status: DC
  Administered 2024-06-09: 3 [IU] via SUBCUTANEOUS
  Administered 2024-06-10 (×2): 2 [IU] via SUBCUTANEOUS
  Administered 2024-06-11: 1 [IU] via SUBCUTANEOUS
  Filled 2024-06-07 (×5): qty 1

## 2024-06-07 MED ORDER — LATANOPROST 0.005 % OP SOLN
1.0000 [drp] | Freq: Every day | OPHTHALMIC | Status: DC
  Administered 2024-06-08: 1 [drp] via OPHTHALMIC
  Filled 2024-06-07: qty 2.5

## 2024-06-07 MED ORDER — METOPROLOL TARTRATE 5 MG/5ML IV SOLN: 2.5000 mg | INTRAVENOUS | Status: DC | PRN

## 2024-06-07 MED ORDER — VITAMIN C 500 MG PO TABS
500.0000 mg | ORAL_TABLET | Freq: Every day | ORAL | Status: DC
  Administered 2024-06-08 – 2024-06-12 (×4): 500 mg via ORAL
  Filled 2024-06-07 (×4): qty 1

## 2024-06-07 MED ORDER — ROSUVASTATIN CALCIUM 10 MG PO TABS
5.0000 mg | ORAL_TABLET | Freq: Every day | ORAL | Status: DC
  Administered 2024-06-08 – 2024-06-12 (×4): 5 mg via ORAL
  Filled 2024-06-07 (×4): qty 1

## 2024-06-07 NOTE — ED Notes (Signed)
 Colostomy bag emptied, dark red blood noted; 250 mL output

## 2024-06-07 NOTE — ED Triage Notes (Signed)
 Patient C/O bloody, loose stool that began this morning. Patient has right upper colostomy in place and has emptied her colostomy bag around five times today. Patient was on Eliquis  until July 3rd, and is now on Plavix. Patient denies abdominal pain, vomiting, SOB or weakness at this time. Patient does have a history of GI bleeds in the past and has had to have clips placed.

## 2024-06-07 NOTE — ED Notes (Signed)
 Colostomy bag emptied, 225 mL output

## 2024-06-07 NOTE — ED Notes (Signed)
 ED Provider at bedside.

## 2024-06-07 NOTE — ED Provider Notes (Signed)
 Tammy Arias Provider Note    Event Date/Time   First MD Initiated Contact with Patient 06/07/24 1931     (approximate)   History   Rectal Bleeding   HPI  Tammy Arias is a 68 y.o. female with history of liver cirrhosis, atrial fibrillation on Plavix, presenting with dark black and maroon stool that started at 4 PM today.  Patient states that she had emptied out her colostomy bag once before coming in and now the bag is full here.  She denies any nausea vomiting, no chest pain shortness of breath.  No abdominal tenderness at this time.  States that she is been compliant with the Plavix.  Was taken off her Eliquis  due to prior GI bleed.  Patient states that she had a GI bleed in the past, that time was bright red blood, states that that time was due to polyps that got removed.  This was before she had the total colectomy and end ileostomy created after she had C. difficile in April 2025.   On independent chart review, she was admitted in April of this year for septic shock secondary to C. difficile status post total colectomy and end ileostomy.  In May 2024, she had an EGD done that showed grade 2 esophageal varices, had a colonoscopy done that showed nonbleeding internal hemorrhoids, found polyps in her colon that were removed.  Physical Exam   Triage Vital Signs: ED Triage Vitals  Encounter Vitals Group     BP 06/07/24 1922 (!) 110/57     Girls Systolic BP Percentile --      Girls Diastolic BP Percentile --      Boys Systolic BP Percentile --      Boys Diastolic BP Percentile --      Pulse Rate 06/07/24 1922 (!) 104     Resp 06/07/24 1922 18     Temp 06/07/24 1922 99.6 F (37.6 C)     Temp Source 06/07/24 1922 Oral     SpO2 06/07/24 1922 100 %     Weight 06/07/24 1923 123 lb 9.6 oz (56.1 kg)     Height 06/07/24 1923 5' 1 (1.549 m)     Head Circumference --      Peak Flow --      Pain Score 06/07/24 1923 0     Pain Loc --      Pain Education --       Exclude from Growth Chart --     Most recent vital signs: Vitals:   06/07/24 1922  BP: (!) 110/57  Pulse: (!) 104  Resp: 18  Temp: 99.6 F (37.6 C)  SpO2: 100%     General: Awake, no distress.  CV:  Good peripheral perfusion.  Resp:  Normal effort.  Abd:  No distention.  Soft nontender, she has dark maroon stool in her ileostomy bag. Other:  She is alert, moving all 4 extremities   ED Results / Procedures / Treatments   Labs (all labs ordered are listed, but only abnormal results are displayed) Labs Reviewed  COMPREHENSIVE METABOLIC PANEL WITH GFR - Abnormal; Notable for the following components:      Result Value   Glucose, Bld 128 (*)    Creatinine, Ser 1.02 (*)    GFR, Estimated 60 (*)    All other components within normal limits  CBC - Abnormal; Notable for the following components:   RBC 2.79 (*)    Hemoglobin 9.3 (*)  HCT 29.5 (*)    MCV 105.7 (*)    RDW 15.6 (*)    Platelets 105 (*)    All other components within normal limits  PROTIME-INR  APTT  POC OCCULT BLOOD, ED  TYPE AND SCREEN     PROCEDURES:  Critical Care performed: Yes, see critical care procedure note(s)  .Critical Care  Performed by: Waymond Lorelle Cummins, MD Authorized by: Waymond Lorelle Cummins, MD   Critical care provider statement:    Critical care time (minutes):  40   Critical care was time spent personally by me on the following activities:  Development of treatment plan with patient or surrogate, discussions with consultants, evaluation of patient's response to treatment, examination of patient, ordering and review of laboratory studies, ordering and review of radiographic studies, ordering and performing treatments and interventions, pulse oximetry, re-evaluation of patient's condition and review of old charts    MEDICATIONS ORDERED IN ED: Medications  octreotide  (SANDOSTATIN ) 2 mcg/mL load via infusion 50 mcg (50 mcg Intravenous Bolus from Bag 06/07/24 2015)    And  octreotide   (SANDOSTATIN ) 500 mcg in sodium chloride  0.9 % 250 mL (2 mcg/mL) infusion (50 mcg/hr Intravenous New Bag/Given 06/07/24 2015)  pantoprazole  (PROTONIX ) injection 80 mg (80 mg Intravenous Given 06/07/24 1957)  cefTRIAXone  (ROCEPHIN ) 1 g in sodium chloride  0.9 % 100 mL IVPB (0 g Intravenous Stopped 06/07/24 2012)     IMPRESSION / MDM / ASSESSMENT AND PLAN / ED COURSE  I reviewed the triage vital signs and the nursing notes.                              Differential diagnosis includes, but is not limited to, GI bleed, suspect this might be upper given that the stool is dark, esophageal varices, peptic ulcer disease, anemia.  Will get labs, PT/INR, will give her IV ceftriaxone , IV Protonix , IV octreotide .  Will reach out to GI, she will need to be admitted for further management.  Patient's presentation is most consistent with acute presentation with potential threat to life or bodily function.  Independent interpretation of labs and imaging below.  Consulted Dr. Aundria from GI who will see the patient tomorrow.  Agreed with the plan.  Consulted hospitalist who is agreeable with plan for admission and will evaluate the patient.  She is admitted.  The patient is on the cardiac monitor to evaluate for evidence of arrhythmia and/or significant heart rate changes.   Clinical Course as of 06/07/24 2036  Fri Jun 07, 2024  2036 Independent review of labs, INR is normal, hemoglobin is 9.3, no leukocytosis, electrolytes not severely deranged, LFTs are normal. [TT]    Clinical Course User Index [TT] Waymond, Lorelle Cummins, MD     FINAL CLINICAL IMPRESSION(S) / ED DIAGNOSES   Final diagnoses:  Gastrointestinal hemorrhage, unspecified gastrointestinal hemorrhage type  History of cirrhosis  History of esophageal varices     Rx / DC Orders   ED Discharge Orders     None        Note:  This document was prepared using Dragon voice recognition software and may include unintentional dictation errors.     Waymond Lorelle Cummins, MD 06/07/24 2036

## 2024-06-07 NOTE — H&P (Signed)
 History and Physical    Tammy Arias FMW:[id number] DOB: Jul 24, 1956 DOA: 06/07/2024  Referring MD/NP/PA:   PCP: Valora Agent, MD   Patient coming from:  The patient is coming from home.     Chief Complaint: dark bloody loose stool    HPI: Tammy Arias is a 68 y.o. female with medical history significant of liver cirrhosis with esophageal varices, AVM, GIB, HTN, HLD, DM, CAD with CABG, sCHF with EF35-40%, TIA, hypothyroidism, gout, GERD, OSA on CPAP, anemia, A-fib on plavix and ASA (s/p of Watchman procedure), pulmonary hypertension, IBS, Cor Atrarium (s/p of repair), lumbar stenosis, pancolitis and C. difficile colitis (s/p of total colectomy and ileostomy April 2025), who presents with dark bloody loose stool.  Pt states that she started having dark bloody loose stool in her ileostomy bag since 4 PM today.  She has changed her bag 7 times so far.  Denies nausea, vomiting, abdominal pain.  No dizziness or lightheadedness.  Denies SOB.  No fever or chills.  No chest pain, cough, symptoms of UTI.   Of note, patient was on Eliquis  until July 3rd, and is now on Plavix and baby aspirin .  She took last dose Plavix and aspirin  this morning.  In May 2024, she had an EGD done that showed grade 2 esophageal varices. Pthad a colonoscopy done that showed nonbleeding internal hemorrhoids, found polyps in her colon that were removed. This was before she had the total colectomy and end ileostomy created after she had C. difficile in April 2025.   Data reviewed independently and ED Course: pt was found to have Hgb 10.5 on 04/07/24 --> 9.3 --> 7.8, WBC 4.7, renal function at baseline, temperature normal, blood pressure 110/57, heart rate 104 --> 99, RR 18, oxygen saturation 100% on room air.   EKG: I have personally reviewed.  A-fib, low voltage, borderline left axis deviation.  Review of Systems:   General: no fevers, chills, no body weight gain. HEENT: no blurry vision, hearing changes or sore  throat Respiratory: no dyspnea, coughing, wheezing CV: no chest pain, no palpitations GI: no nausea, vomiting, abdominal pain, diarrhea, constipation. Has dark bloody loose stool GU: no dysuria, burning on urination, increased urinary frequency, hematuria  Ext: no leg edema Neuro: no unilateral weakness, numbness, or tingling, no vision change or hearing loss Skin: no rash, no skin tear. MSK: No muscle spasm, no deformity, no limitation of range of movement in spin Heme: No easy bruising.  Travel history: No recent long distant travel.   Allergy:  Allergies  Allergen Reactions   Vioxx [Rofecoxib] Swelling    Past Medical History:  Diagnosis Date   AC (acromioclavicular) joint bone spurs, right    Anemia    Arthritis    Atrial fibrillation (HCC)    a.) CHA2DS2-VASc = 7 (age, sex, HFrEF, HTN, TIA x 2, T2DM). b.) rate/rhythm controlled on oral carvedilol ; chronically anticoagulated with warfarin.   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 b.) R/LHC 03/29/2017: small RCA with occluded SVG; no significant Dz. Chronically occluded LAD with patent SVG to D1/LAD. Insignificant Dz in LCx. LM normal.   Cortical cataract    DOE (dyspnea on exertion)    DOE (dyspnea on exertion)    GERD (gastroesophageal reflux disease)  HFrEF (heart failure with reduced ejection fraction) (HCC)    a.) TTE 10/27/2014: EF 40%; glob HK; mild BAE; triv AR, mild MR/PR, mod TR.  b.) TTE 03/15/2017: EF 30%; glob HK; mod BAE; mod pHTN (RVSP 54.8 mmHg); mild PR, mod MR; sev TR.  c.) R/LHC 03/29/2017: EF 30-35%. d.)TTE 10/25/2018: EF 35%; mod BAE; mod BVE; glob HK; triv PR, mod MR, sev TR; RVSP 57.7 mmHg; G2DD.   History of shingles 2004   Hyperlipidemia    Hypertension    Hypothyroidism    IBS (irritable bowel  syndrome)    Ischemic cardiomyopathy    a.) TTE 10/27/2014: EF 40%. b.) TTE 03/15/2017: EF 30%. c.) R/LHC 04/01/2017: EF 30-35%. d.) TTE 10/25/2018: EF 35%   Lower GI bleed 06/07/2023   Lumbar scoliosis    Lumbar spinal stenosis    Melena 12/17/2018   Migraines    Osteoporosis    Pancolitis (HCC) 02/28/2024   Pulmonary HTN (HCC)    a.) TTE 03/15/2017: EF 30-35%; RVSP 54.8 mmHg. 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   Sleep apnea    T2DM (type 2 diabetes mellitus) (HCC)    TIA (transient ischemic attack) 05/29/2016   Vitamin B 12 deficiency     Past Surgical History:  Procedure Laterality Date   CARDIAC CATHETERIZATION     CATARACT EXTRACTION W/ INTRAOCULAR LENS IMPLANT Bilateral    Cataract Extraction with IOL   COLECTOMY  02/28/2024   Procedure: COLECTOMY, TOTAL;  Surgeon: Lane Shope, MD;  Location: [facility] ORS;  Service: General;;   COLONOSCOPY N/A 12/19/2018   Procedure: COLONOSCOPY;  Surgeon: Janalyn Keene NOVAK, MD;  Location: [facility] ENDOSCOPY;  Service: Endoscopy;  Laterality: N/A;   COLONOSCOPY WITH PROPOFOL  N/A 06/17/2020   Procedure: COLONOSCOPY WITH PROPOFOL ;  Surgeon: Janalyn Keene NOVAK, MD;  Location: [facility] ENDOSCOPY;  Service: Endoscopy;  Laterality: N/A;   COLONOSCOPY WITH PROPOFOL  N/A 03/30/2023   Procedure: COLONOSCOPY WITH PROPOFOL ;  Surgeon: Onita Elspeth Sharper, DO;  Location: [facility] ENDOSCOPY;  Service: Endoscopy;  Laterality: N/A;   COR TRIATRIATUM REPAIR N/A 01/28/2004   CORONARY ARTERY BYPASS GRAFT N/A 01/28/2004   3v CABG   ESOPHAGOGASTRODUODENOSCOPY N/A 12/19/2018   Procedure: ESOPHAGOGASTRODUODENOSCOPY (EGD);  Surgeon: Janalyn Keene NOVAK, MD;  Location: [facility] ENDOSCOPY;  Service: Endoscopy;  Laterality: N/A;   ESOPHAGOGASTRODUODENOSCOPY (EGD) WITH PROPOFOL  N/A 06/17/2020   Procedure: ESOPHAGOGASTRODUODENOSCOPY (EGD) WITH PROPOFOL ;  Surgeon: Janalyn Keene NOVAK, MD;  Location: [facility] ENDOSCOPY;  Service: Endoscopy;  Laterality: N/A;   ESOPHAGOGASTRODUODENOSCOPY (EGD) WITH PROPOFOL  N/A 03/30/2023   Procedure: ESOPHAGOGASTRODUODENOSCOPY (EGD) WITH PROPOFOL ;  Surgeon: Onita Elspeth Sharper, DO;  Location: [facility] ENDOSCOPY;  Service: Endoscopy;  Laterality: N/A;   LAPAROTOMY N/A 02/28/2024   Procedure: LAPAROTOMY, EXPLORATORY;  Surgeon: Lane Shope, MD;  Location: [facility] ORS;  Service: General;  Laterality: N/A;   RIGHT/LEFT HEART CATH AND CORONARY ANGIOGRAPHY Bilateral 03/29/2017   Procedure: Right/Left Heart Cath and Coronary Angiography;  Surgeon: Vinie DELENA Jude, MD;  Location: [facility] INVASIVE CV LAB;  Service: Cardiovascular;  Laterality: Bilateral;   TOTAL KNEE ARTHROPLASTY Right 04/05/2022   Procedure: TOTAL KNEE ARTHROPLASTY;  Surgeon: Kathlynn Sharper, MD;  Location: [facility] ORS;  Service: Orthopedics;  Laterality: Right;   TUBAL LIGATION     VENTRAL HERNIA REPAIR N/A 04/21/2017   Procedure: HERNIA REPAIR VENTRAL ADULT;  Surgeon: Claudene Larinda Bolder, MD;  Location: [facility] ORS;  Service: General;  Laterality: N/A;    Social History:  reports that she quit smoking about 25 years ago. Her smoking use included cigarettes. She has never used smokeless tobacco. She reports that she does not drink alcohol and does not use drugs.  Family History:  Family History  Problem Relation Age of Onset   Valvular heart disease Mother    Diabetes Father    Cancer Sister        lung   Cancer Sister        cervical   Breast cancer Cousin      Prior to Admission medications   Medication Sig Start Date End Date Taking? Authorizing Provider  albuterol  (PROVENTIL  HFA;VENTOLIN  HFA) 108 (90 Base) MCG/ACT inhaler Inhale 2 puffs into the lungs every 6 (six) hours as needed for wheezing or shortness of breath.    [provider]  allopurinol  (ZYLOPRIM ) 300 MG tablet Take 300 mg by mouth daily.    [provider]  apixaban  (ELIQUIS ) 2.5 MG TABS tablet Take  1 tablet (2.5 mg total) by mouth 2 (two) times daily. 03/31/24   Fausto Sor A, DO  ascorbic acid  (VITAMIN C ) 500 MG tablet Take 1 tablet (500 mg total) by mouth daily. 03/12/24 06/10/24  Von Bellis, MD  colchicine  0.6 MG tablet Take 0.6-1.8 mg by mouth as needed (take 1.2 mg (2 tablets) at onset of gout flare, Take one additional tablet one hour later if symptoms persist. Maximum of 3 tablets per day). Patient not taking: Reported on 04/04/2024    [provider]  iron  polysaccharides (NIFEREX) 150 MG capsule Take 1 capsule (150 mg total) by mouth daily. 03/12/24 06/10/24  Von Bellis, MD  latanoprost  (XALATAN ) 0.005 % ophthalmic solution Place 1 drop into both eyes at bedtime.    [provider]  levothyroxine  (SYNTHROID ) 50 MCG tablet Take 50 mcg by mouth daily before breakfast.    [provider]  loperamide  (IMODIUM  A-D) 2 MG tablet Take 2 tablets (4 mg total) by mouth 4 (four) times daily as needed for diarrhea or loose stools. 04/04/24   Lane Shope, MD  metFORMIN  (GLUCOPHAGE ) 1000 MG tablet Take 1,000 mg by mouth 2 (two) times daily with a meal.    [provider]  midodrine  (PROAMATINE ) 10 MG tablet Take 1 tablet (10 mg total) by mouth 3 (three) times daily with meals. Patient taking differently: Take 5 mg by mouth 3 (three) times daily with meals. 03/12/24 06/10/24  Von Bellis, MD  pantoprazole  (PROTONIX ) 40 MG tablet Take 40 mg by mouth daily. 01/28/21   [provider]  potassium chloride  (KLOR-CON ) 10 MEQ tablet Take 1 tablet (10 mEq total) by mouth daily as needed (if you need to take torsemide , also take potassium). Patient not taking: Reported on 04/04/2024 03/31/24   Fausto Sor A, DO  rosuvastatin  (CRESTOR ) 5 MG tablet Take 5 mg by mouth daily. 02/20/24 02/19/25  [provider]  sodium bicarbonate  650 MG tablet Take 1 tablet (650 mg total) by mouth 3 (three) times daily. 03/31/24   Fausto Sor LABOR, DO  spironolactone   (ALDACTONE ) 25 MG tablet Take 0.5 tablets (12.5 mg total) by mouth daily. 03/31/24   Fausto Sor LABOR, DO  torsemide  (DEMADEX ) 20 MG tablet Take 1 tablet (20 mg total) by mouth daily as needed (for weight gain or swelling). Patient not taking: Reported on 04/04/2024 03/31/24   Fausto Sor LABOR, DO    Physical Exam: Vitals:   06/07/24 1922 06/07/24 1923  BP: (!) 110/57   Pulse: (!) 104   Resp: 18   Temp: 99.6 F (37.6 C)   TempSrc: Oral   SpO2: 100%   Weight:  56.1 kg  Height:  5' 1 (1.549 m)   General: Not in acute distress HEENT:       Eyes: PERRL, EOMI, no jaundice       ENT: No discharge from the ears and nose, no pharynx injection, no tonsillar enlargement.        Neck: No JVD, no bruit, no mass felt. Heme: No neck lymph node enlargement. Cardiac: S1/S2, irregularly irregular rhythm, no gallops or rubs. Respiratory: No rales, wheezing, rhonchi or rubs. GI: Soft, nondistended, nontender, no rebound pain, no organomegaly, BS present. Has ileostomy bag in place, filled with dark bloody loose stool GU: No hematuria Ext: No pitting leg edema bilaterally. 1+DP/PT pulse bilaterally. Musculoskeletal: No joint deformities, No joint redness or warmth, no limitation of ROM in spin. Skin: No rashes.  Neuro: Alert, oriented X3, cranial nerves II-XII grossly intact, moves all extremities normally.  Psych: Patient is not psychotic, no suicidal or hemocidal ideation.  Labs on Admission: I have personally reviewed following labs and imaging studies  CBC: Recent Labs  Lab 06/07/24 1929 06/07/24 2256  WBC 4.7 5.1  HGB 9.3* 7.8*  HCT 29.5* 24.2*  MCV 105.7* 104.8*  PLT 105* 93*   Basic Metabolic Panel: Recent Labs  Lab 06/07/24 1929  NA 137  K 4.3  CL 104  CO2 23  GLUCOSE 128*  BUN 19  CREATININE 1.02*  CALCIUM  9.2   GFR: Estimated Creatinine Clearance: 39.3 mL/min (A) (by C-G formula based on SCr of 1.02 mg/dL (H)). Liver Function Tests: Recent Labs  Lab 06/07/24 1929   AST 40  ALT 22  ALKPHOS 124  BILITOT 0.9  PROT 7.3  ALBUMIN  4.1   No results for input(s): LIPASE, AMYLASE in the last 168 hours. No results for input(s): AMMONIA in the last 168 hours. Coagulation Profile: Recent Labs  Lab 06/07/24 1930  INR 1.1   Cardiac Enzymes: No results for input(s): CKTOTAL, CKMB, CKMBINDEX, TROPONINI in the last 168 hours. BNP (last 3 results) No results for input(s): PROBNP in the last 8760 hours. HbA1C: No results for input(s): HGBA1C in the last 72 hours. CBG: No results for input(s): GLUCAP in the last 168 hours. Lipid Profile: No results for input(s): CHOL, HDL, LDLCALC, TRIG, CHOLHDL, LDLDIRECT in the last 72 hours. Thyroid  Function Tests: No results for input(s): TSH, T4TOTAL, FREET4, T3FREE, THYROIDAB in the last 72 hours. Anemia Panel: No results for input(s): VITAMINB12, FOLATE, FERRITIN, TIBC, IRON , RETICCTPCT in the last 72 hours. Urine analysis:    Component Value Date/Time   COLORURINE YELLOW (A) 04/05/2024 0331   APPEARANCEUR CLEAR (A) 04/05/2024 0331   LABSPEC 1.006 04/05/2024 0331   PHURINE 5.0 04/05/2024 0331   GLUCOSEU NEGATIVE 04/05/2024 0331   HGBUR NEGATIVE 04/05/2024 0331   BILIRUBINUR NEGATIVE 04/05/2024 0331   KETONESUR NEGATIVE 04/05/2024 0331   PROTEINUR NEGATIVE 04/05/2024 0331   NITRITE NEGATIVE 04/05/2024 0331   LEUKOCYTESUR TRACE (A) 04/05/2024 0331   Sepsis Labs: @LABRCNTIP (procalcitonin:4,lacticidven:4) )No results found for this or any previous visit (from the past 240 hours).   Radiological Exams on Admission:   Assessment/Plan Principal Problem:   GI bleeding Active Problems:   Iron  deficiency anemia   Acute blood loss anemia   Cirrhosis and Portal hypertension  on CT(HCC)   Thrombocytopenia (HCC)   CAD with hx of CABG  Hypothyroidism   Chronic systolic CHF (congestive heart failure) (HCC) - LVEF 35 to 40%   HTN (hypertension)    Hyperlipidemia   Atrial fibrillation, chronic (HCC)   Gout   Type 2 diabetes mellitus with chronic kidney disease, without long-term current use of insulin  (HCC)   GERD without esophagitis   Ileostomy in place (HCC)   OSA on CPAP   Assessment and Plan:  GI bleeding, iron  deficiency anemia and acute blood loss anemia: Hgb 10.5 --> 9.3 --> 7.8.  Patient is asymptomatic.  Epic message is scheduled to  be sent to Dr. Aundria of GI for consult  - will admitted to tele bed as inpatient - Hold Plavix and aspirin  -Patient received 1 dose of Rocephin  in ED, will not continue antibiotics. - NPO after MN, pending GI consult.  - IVF:  will not IVF due to low EF 35-40% - Start IV pantoprazole  40 mg bid (patient received 1 dose of 80 mg Protonix  in ED) - Octreotide  gtt is started in the ED. - Zofran  IV for nausea - Avoid NSAIDs and SQ heparin  - Maintain IV access (2 large bore IVs if possible). - Monitor closely and follow q6h cbc, transfuse as necessary, if Hgb<7.0 - La[facility]: INR, PTT and type screen  Cirrhosis and Portal hypertension  on CT(HCC): Mental status normal - Check ammonia level  Thrombocytopenia (HCC): Platelet 105, this is chronic issue, likely related to cirrhosis. - Follow-up with CBC  CAD with hx of CABG: No chest pain -Hold aspirin  and Plavix - Continue Crestor   Hypothyroidism -Synthroid   Chronic systolic CHF (congestive heart failure) (HCC) - LVEF 35 to 40%: 2D echo on 03/07/2024 showed EF 35-40%.  No SOB or leg edema.  CHF seem to be compensated.  BNP 125. -Temporarily hold spironolactone  and torsemide  (Patient is taking torsemide  as needed)  HTN (hypertension) -IV hydralazine  as needed  Hyperlipidemia -Crestor   Atrial fibrillation, chronic (HCC): Heart rate 104- > 99 -Hold Plavix and aspirin  - As needed metoprolol  2.5 mg every 2 hours for heart rate higher than 125  Gout -Allopurinol   Type 2 diabetes mellitus with chronic kidney disease, without long-term  current use of insulin  [facility]): Recent A1c 5.9, well-controlled.  Patient is  taking metformin  -SSI  GERD without esophagitis -On Protonix  IV as above  Ileostomy in place [facility]): No acute issues.  OSA - on CPAP      DVT ppx: SCD  Code Status: Full code   Family Communication:    Yes, patient's friend   at bed side.    Disposition Plan:  Anticipate discharge back to previous environment  Consults called: Dr. Aundria for GI  Admission status and Level of care: Telemetry Medical:  as inpt        Dispo: The patient is from: Home              Anticipated d/c is to: Home              Anticipated d/c date is: 2 days              Patient currently is not medically stable to d/c.    Severity of Illness:  The appropriate patient status for this patient is INPATIENT. Inpatient status is judged to be reasonable and necessary in order to provide the required intensity of service to ensure the patient's safety. The patient's presenting symptoms, physical exam findings, and initial radiographic and laboratory data in the context of their chronic comorbidities is felt to place them  at high risk for further clinical deterioration. Furthermore, it is not anticipated that the patient will be medically stable for discharge from the hospital within 2 midnights of admission.   * I certify that at the point of admission it is my clinical judgment that the patient will require inpatient hospital care spanning beyond 2 midnights from the point of admission due to high intensity of service, high risk for further deterioration and high frequency of surveillance required.*       Date of Service 06/07/2024    Caleb Exon [facility]   If 7PM-7AM, please contact night-coverage [email or web address] 06/07/2024, 11:56 PM

## 2024-06-08 DIAGNOSIS — K922 Gastrointestinal hemorrhage, unspecified: Secondary | ICD-10-CM

## 2024-06-08 LAB — GLUCOSE, CAPILLARY
Glucose-Capillary: 95 mg/dL (ref 70–99)
Glucose-Capillary: 149 mg/dL — ABNORMAL HIGH (ref 70–99)

## 2024-06-08 LAB — BASIC METABOLIC PANEL WITH GFR
Anion gap: 7 (ref 5–15)
BUN: 21 mg/dL (ref 8–23)
CO2: 23 mmol/L (ref 22–32)
Calcium: 8.1 mg/dL — ABNORMAL LOW (ref 8.9–10.3)
Chloride: 111 mmol/L (ref 98–111)
Creatinine, Ser: 1.09 mg/dL — ABNORMAL HIGH (ref 0.44–1.00)
GFR, Estimated: 55 mL/min — ABNORMAL LOW (ref >60)
Glucose, Bld: 150 mg/dL — ABNORMAL HIGH (ref 70–99)
Potassium: 4.7 mmol/L (ref 3.5–5.1)
Sodium: 141 mmol/L (ref 135–145)

## 2024-06-08 LAB — CBC
HCT: 20.9 % — ABNORMAL LOW (ref 36.0–46.0)
HCT: 24.3 % — ABNORMAL LOW (ref 36.0–46.0)
HCT: 26.7 % — ABNORMAL LOW (ref 36.0–46.0)
HCT: 27.4 % — ABNORMAL LOW (ref 36.0–46.0)
Hemoglobin: 6.8 g/dL — ABNORMAL LOW (ref 12.0–15.0)
Hemoglobin: 7.6 g/dL — ABNORMAL LOW (ref 12.0–15.0)
Hemoglobin: 8.7 g/dL — ABNORMAL LOW (ref 12.0–15.0)
Hemoglobin: 9 g/dL — ABNORMAL LOW (ref 12.0–15.0)
MCH: 34.7 pg — ABNORMAL HIGH (ref 26.0–34.0)
MCH: 34.1 pg — ABNORMAL HIGH (ref 26.0–34.0)
MCH: 33.3 pg (ref 26.0–34.0)
MCH: 32.6 pg (ref 26.0–34.0)
MCHC: 32.5 g/dL (ref 30.0–36.0)
MCHC: 31.3 g/dL (ref 30.0–36.0)
MCHC: 32.6 g/dL (ref 30.0–36.0)
MCHC: 32.8 g/dL (ref 30.0–36.0)
MCV: 106.6 fL — ABNORMAL HIGH (ref 80.0–100.0)
MCV: 109 fL — ABNORMAL HIGH (ref 80.0–100.0)
MCV: 102.3 fL — ABNORMAL HIGH (ref 80.0–100.0)
MCV: 99.3 fL (ref 80.0–100.0)
Platelets: 74 K/uL — ABNORMAL LOW (ref 150–400)
Platelets: 92 K/uL — ABNORMAL LOW (ref 150–400)
Platelets: 77 K/uL — ABNORMAL LOW (ref 150–400)
Platelets: 76 K/uL — ABNORMAL LOW (ref 150–400)
RBC: 1.96 MIL/uL — ABNORMAL LOW (ref 3.87–5.11)
RBC: 2.23 MIL/uL — ABNORMAL LOW (ref 3.87–5.11)
RBC: 2.61 MIL/uL — ABNORMAL LOW (ref 3.87–5.11)
RBC: 2.76 MIL/uL — ABNORMAL LOW (ref 3.87–5.11)
RDW: 15.8 % — ABNORMAL HIGH (ref 11.5–15.5)
RDW: 15.9 % — ABNORMAL HIGH (ref 11.5–15.5)
RDW: 16.6 % — ABNORMAL HIGH (ref 11.5–15.5)
RDW: 17.2 % — ABNORMAL HIGH (ref 11.5–15.5)
WBC: 4.7 K/uL (ref 4.0–10.5)
WBC: 5.3 K/uL (ref 4.0–10.5)
WBC: 5.1 K/uL (ref 4.0–10.5)
WBC: 4.9 K/uL (ref 4.0–10.5)
nRBC: 0 % (ref 0.0–0.2)
nRBC: 0 % (ref 0.0–0.2)
nRBC: 0 % (ref 0.0–0.2)
nRBC: 0 % (ref 0.0–0.2)

## 2024-06-08 LAB — GASTROINTESTINAL PANEL BY PCR, STOOL (REPLACES STOOL CULTURE)

## 2024-06-08 LAB — APTT: aPTT: 32 s (ref 24–36)

## 2024-06-08 LAB — CBG MONITORING, ED
Glucose-Capillary: 143 mg/dL — ABNORMAL HIGH (ref 70–99)
Glucose-Capillary: 112 mg/dL — ABNORMAL HIGH (ref 70–99)
Glucose-Capillary: 111 mg/dL — ABNORMAL HIGH (ref 70–99)

## 2024-06-08 LAB — HIV ANTIBODY (ROUTINE TESTING W REFLEX): HIV Screen 4th Generation wRfx: NONREACTIVE

## 2024-06-08 LAB — AMMONIA: Ammonia: 28 umol/L (ref 9–35)

## 2024-06-08 MED ORDER — SODIUM CHLORIDE 0.9 % IV BOLUS
500.0000 mL | Freq: Once | INTRAVENOUS | Status: AC
  Administered 2024-06-08: 500 mL via INTRAVENOUS

## 2024-06-08 MED ORDER — SODIUM CHLORIDE 0.9 % IV BOLUS: 1000.0000 mL | Freq: Once | INTRAVENOUS | Status: DC

## 2024-06-08 MED ORDER — SODIUM CHLORIDE 0.9 % IV SOLN: Freq: Once | INTRAVENOUS | Status: AC

## 2024-06-08 NOTE — Consult Note (Signed)
 [facility] GI Inpatient Consult Note   Ladell Francis Boss, M.D.  Reason for Consult: Gastrointestinal bleeding   Attending Requesting Consult: Xilin Niu, M.D.  Outpatient Primary Physician: Lynwood Null, M.D.  History of Present Illness: Tammy Arias is a 68 y.o. female with a history of CAD s/p CABD, CHF, cryptogenic cirrhosis, Grade II esophageal varices who presented to the [facility] ED yesterday with complaint of gastrointestinal bleeding.  Initially had colonoscopy January 2025 at [facility] for rectal bleeding -had multiple AVMs and required transfusion.  April 2025 was admitted to ICU for shock and severe sepsis secondary to C. difficile colitis. She had exploratory laparotomy with total colectomy and end ileostomy creation for septic shock secondary to C. difficile.  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 Also has past medical history of hypertension, type 2 diabetes. Recently had MitraClip clip placement.  Initially after colostomy had issues with dehydration, had multiple hospitalizations during April and May..She was seen in our office by Romero Antigua on 05/23/24 and said she was able to eat well without any abdominal pain. She denied any nausea vomiting. She did not have any GERD symptoms as long she takes Protonix  40 mg once a day. She endorsed good output with colostomy. Reported that stools were darker since restart iron  supplement. Denied any abdominal pain. No obvious blood in colostomy at the time.  Patient presents at this time with roughly one day history of melenic stools in her ileostomy bag. She denies nausea, vomiting and abdominal pain. Patient takes Plavix and ASA which was taken yesterday and has since been held by the admitting team. She reports no further blood per ostomy bag since 4 AM this morning.  She now reports brown stool in the ostomy  bag.  She denies, again, abdominal pain  No alcohol tobacco or NSAIDs.  Past Medical History:  Past Medical History:  Diagnosis Date   AC (acromioclavicular) joint bone spurs, right    Anemia    Arthritis    Atrial fibrillation (HCC)    a.) CHA2DS2-VASc = 7 (age, sex, HFrEF, HTN, TIA x 2, T2DM). b.) rate/rhythm controlled on oral carvedilol ; chronically anticoagulated with warfarin.   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 b.) R/LHC 03/29/2017: small RCA with occluded SVG; no significant Dz. Chronically occluded LAD with patent SVG to D1/LAD. Insignificant Dz in LCx. LM normal.   Cortical cataract    DOE (dyspnea on exertion)    DOE (dyspnea on exertion)    GERD (gastroesophageal reflux disease)    HFrEF (heart failure with reduced ejection fraction) (HCC)    a.) TTE 10/27/2014: EF 40%; glob HK; mild BAE; triv AR, mild MR/PR, mod TR.  b.) TTE 03/15/2017: EF 30%; glob HK; mod BAE; mod pHTN (RVSP 54.8 mmHg); mild PR, mod MR; sev TR.  c.) R/LHC 03/29/2017: EF 30-35%. d.)TTE 10/25/2018: EF 35%; mod BAE; mod BVE; glob HK; triv PR, mod MR, sev TR; RVSP 57.7 mmHg; G2DD.   History of shingles 2004   Hyperlipidemia    Hypertension    Hypothyroidism    IBS (irritable bowel syndrome)    Ischemic cardiomyopathy    a.) TTE  10/27/2014: EF 40%. b.) TTE 03/15/2017: EF 30%. c.) R/LHC 04/01/2017: EF 30-35%. d.) TTE 10/25/2018: EF 35%   Lower GI bleed 06/07/2023   Lumbar scoliosis    Lumbar spinal stenosis    Melena 12/17/2018   Migraines    Osteoporosis    Pancolitis (HCC) 02/28/2024   Pulmonary HTN (HCC)    a.) TTE 03/15/2017: EF 30-35%; RVSP 54.8 mmHg. 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    Sleep apnea    T2DM (type 2 diabetes mellitus) (HCC)    TIA (transient ischemic attack) 05/29/2016   Vitamin B 12 deficiency     Problem List: Patient Active Problem List   Diagnosis Date Noted   GI bleeding 06/07/2024   Acute blood loss anemia 06/07/2024   Irritant contact dermatitis associated with fecal stoma 04/14/2024   Ileostomy care (HCC) 04/14/2024   Dehydration 04/14/2024   Hypokalemia 04/06/2024   Ileostomy in place (HCC) 04/04/2024   Status post total colectomy 04/04/2024   Chronic metabolic acidosis 04/04/2024   Enteritis due to Norovirus 04/04/2024   Elevated LFTs 04/04/2024   Glaucoma 04/04/2024   GERD without esophagitis 03/29/2024   Type 2 diabetes mellitus with chronic kidney disease, without long-term current use of insulin  (HCC) 03/29/2024   Dyslipidemia 03/29/2024   Chronic systolic CHF (congestive heart failure) (HCC) - LVEF 35 to 40% 03/17/2024   HTN (hypertension) 03/17/2024   Hypothyroidism 03/17/2024   Gout 03/17/2024   Hyponatremia 03/17/2024   Hypomagnesemia 03/17/2024   Acute kidney injury superimposed on stage 3a chronic kidney disease (HCC) - baseline scr 1.2 03/17/2024   Chronic diastolic CHF (congestive heart failure) (HCC) 06/07/2023   Chronic anticoagulation - on Eliquis  for chronic afib 06/07/2023   Cirrhosis and Portal hypertension  on CT(HCC) 06/07/2023   Thrombocytopenia (HCC) 06/06/2023   CAD with hx of CABG 06/06/2023   OSA on CPAP 05/23/2023   S/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, right 04/05/2022   S/P TKR (total knee replacement) 04/05/2022   Colon cancer screening    Iron  deficiency anemia    Gastric AVM    Barrett's esophagus without dysplasia    Angiodysplasia of intestinal tract    Internal hemorrhoids    Pulmonary hypertension (HCC) 03/29/2017   Coronary artery disease involving native coronary artery of native heart with unstable angina pectoris (HCC) 03/21/2017   Ischemic cardiomyopathy 03/21/2017   Atrial  fibrillation, chronic (HCC) 10/08/2014   Hyperlipidemia 10/08/2014   Type 2 diabetes mellitus (HCC) 10/08/2014    Past Surgical History: Past Surgical History:  Procedure Laterality Date   CARDIAC CATHETERIZATION     CATARACT EXTRACTION W/ INTRAOCULAR LENS IMPLANT Bilateral    Cataract Extraction with IOL   COLECTOMY  02/28/2024   Procedure: COLECTOMY, TOTAL;  Surgeon: Lane Shope, MD;  Location: [facility] ORS;  Service: General;;   COLONOSCOPY N/A 12/19/2018   Procedure: COLONOSCOPY;  Surgeon: Janalyn Keene NOVAK, MD;  Location: [facility] ENDOSCOPY;  Service: Endoscopy;  Laterality: N/A;   COLONOSCOPY WITH PROPOFOL  N/A 06/17/2020   Procedure: COLONOSCOPY WITH PROPOFOL ;  Surgeon: Janalyn Keene NOVAK, MD;  Location: [facility] ENDOSCOPY;  Service: Endoscopy;  Laterality: N/A;   COLONOSCOPY WITH PROPOFOL  N/A 03/30/2023   Procedure: COLONOSCOPY WITH PROPOFOL ;  Surgeon: Onita Elspeth Sharper, DO;  Location: [facility] ENDOSCOPY;  Service: Endoscopy;  Laterality: N/A;   COR TRIATRIATUM REPAIR N/A 01/28/2004   CORONARY ARTERY  BYPASS GRAFT N/A 01/28/2004   3v CABG   ESOPHAGOGASTRODUODENOSCOPY N/A 12/19/2018   Procedure: ESOPHAGOGASTRODUODENOSCOPY (EGD);  Surgeon: Janalyn Keene NOVAK, MD;  Location: [facility] ENDOSCOPY;  Service: Endoscopy;  Laterality: N/A;   ESOPHAGOGASTRODUODENOSCOPY (EGD) WITH PROPOFOL  N/A 06/17/2020   Procedure: ESOPHAGOGASTRODUODENOSCOPY (EGD) WITH PROPOFOL ;  Surgeon: Janalyn Keene NOVAK, MD;  Location: [facility] ENDOSCOPY;  Service: Endoscopy;  Laterality: N/A;   ESOPHAGOGASTRODUODENOSCOPY (EGD) WITH PROPOFOL  N/A 03/30/2023   Procedure: ESOPHAGOGASTRODUODENOSCOPY (EGD) WITH PROPOFOL ;  Surgeon: Onita Elspeth Sharper, DO;  Location: [facility] ENDOSCOPY;  Service: Endoscopy;  Laterality: N/A;   LAPAROTOMY N/A 02/28/2024   Procedure: LAPAROTOMY, EXPLORATORY;  Surgeon: Lane Shope, MD;  Location: [facility] ORS;  Service: General;  Laterality: N/A;   RIGHT/LEFT HEART CATH AND CORONARY ANGIOGRAPHY Bilateral  03/29/2017   Procedure: Right/Left Heart Cath and Coronary Angiography;  Surgeon: Vinie DELENA Jude, MD;  Location: [facility] INVASIVE CV LAB;  Service: Cardiovascular;  Laterality: Bilateral;   TOTAL KNEE ARTHROPLASTY Right 04/05/2022   Procedure: TOTAL KNEE ARTHROPLASTY;  Surgeon: Kathlynn Sharper, MD;  Location: [facility] ORS;  Service: Orthopedics;  Laterality: Right;   TUBAL LIGATION     VENTRAL HERNIA REPAIR N/A 04/21/2017   Procedure: HERNIA REPAIR VENTRAL ADULT;  Surgeon: Claudene Larinda Bolder, MD;  Location: [facility] ORS;  Service: General;  Laterality: N/A;    Allergies: Allergies  Allergen Reactions   Vioxx [Rofecoxib] Swelling    Home Medications: (Not in a hospital admission)  Home medication reconciliation was completed with the patient.   Scheduled Inpatient Medications:    allopurinol   300 mg Oral Daily   ascorbic acid   500 mg Oral Daily   insulin  aspart  0-5 Units Subcutaneous QHS   insulin  aspart  0-9 Units Subcutaneous TID WC   iron  polysaccharides  150 mg Oral Daily   latanoprost   1 drop Both Eyes QHS   levothyroxine   50 mcg Oral Q0600   magnesium  oxide  400 mg Oral Daily   pantoprazole  (PROTONIX ) IV  40 mg Intravenous Q12H   rosuvastatin   5 mg Oral Daily    Continuous Inpatient Infusions:    octreotide  (SANDOSTATIN ) 500 mcg in sodium chloride  0.9 % 250 mL (2 mcg/mL) infusion 50 mcg/hr (06/08/24 0604)    PRN Inpatient Medications:  acetaminophen , metoprolol  tartrate, ondansetron  (ZOFRAN ) IV  Family History: family history includes Breast cancer in her cousin; Cancer in her sister and sister; Diabetes in her father; Valvular heart disease in her mother.   GI Family History: Negative  Social History:   reports that she quit smoking about 25 years ago. Her smoking use included cigarettes. She has never used smokeless tobacco. She reports that she does not drink alcohol and does not use drugs. The patient denies ETOH, tobacco, or drug use.    Review of Systems: Review of  Systems - Negative except HPI  Physical Examination: BP (!) 90/51   Pulse 91   Temp 98.2 F (36.8 C) (Oral)   Resp 15   Ht 5' 1 (1.549 m)   Wt 56.1 kg   SpO2 100%   BMI 23.35 kg/m  Physical Exam Constitutional:      General: She is not in acute distress.    Appearance: Normal appearance. She is normal weight.  HENT:     Head: Normocephalic and atraumatic.     Right Ear: External ear normal.     Left Ear: External ear normal.     Nose: Nose normal.     Mouth/Throat:     Mouth: Mucous membranes are  moist.     Pharynx: Oropharynx is clear.  Eyes:     General: No scleral icterus.    Extraocular Movements: Extraocular movements intact.     Conjunctiva/sclera: Conjunctivae normal.     Pupils: Pupils are equal, round, and reactive to light.  Cardiovascular:     Rate and Rhythm: Normal rate.     Heart sounds: Normal heart sounds.     No gallop.  Pulmonary:     Effort: Pulmonary effort is normal.     Breath sounds: Normal breath sounds. No wheezing or rales.  Abdominal:     General: There is no distension.     Palpations: Abdomen is soft.     Tenderness: There is no abdominal tenderness. There is no guarding.   Musculoskeletal:        General: No swelling or tenderness. Normal range of motion.     Cervical back: Normal range of motion.  Skin:    General: Skin is warm and dry.     Capillary Refill: Capillary refill takes less than 2 seconds.  Neurological:     General: No focal deficit present.     Mental Status: She is alert.  Psychiatric:        Mood and Affect: Mood normal.        Thought Content: Thought content normal.        Judgment: Judgment normal.     Data: Lab Results  Component Value Date   WBC 5.3 06/08/2024   HGB 7.6 (L) 06/08/2024   HCT 24.3 (L) 06/08/2024   MCV 109.0 (H) 06/08/2024   PLT 92 (L) 06/08/2024   Recent Labs  Lab 06/07/24 2256 06/08/24 0357 06/08/24 0939  HGB 7.8* 6.8* 7.6*   Lab Results  Component Value Date   NA 141  06/08/2024   K 4.7 06/08/2024   CL 111 06/08/2024   CO2 23 06/08/2024   BUN 21 06/08/2024   CREATININE 1.09 (H) 06/08/2024   Lab Results  Component Value Date   ALT 22 06/07/2024   AST 40 06/07/2024   ALKPHOS 124 06/07/2024   BILITOT 0.9 06/07/2024   Recent Labs  Lab 06/07/24 1930 06/07/24 2341  APTT  --  32  INR 1.1  --       Latest Ref Rng & Units 06/08/2024    9:39 AM 06/08/2024    3:57 AM 06/07/2024   10:56 PM  CBC  WBC 4.0 - 10.5 K/uL 5.3  4.7  5.1   Hemoglobin 12.0 - 15.0 g/dL 7.6  6.8  7.8   Hematocrit 36.0 - 46.0 % 24.3  20.9  24.2   Platelets 150 - 400 K/uL 92  74  93     STUDIES: No results found. @IMAGES @  Assessment:  Gastrointestinal bleeding, likely UGI source. Ddx includes esophageal and/or gastric varices, GAVE, Peptic ulcer disease, Malignancy, etc. Currently stable. Chronic anticoagulant use - Plavix and aspirin . Being held for past 24 hrs. sCHF 4.   Thrombocytopenia presumably secondary to cirrhosis. 5.    Child Pugh Class A cirrhosis with 5 pts. MELD score = 11. Consistent with well-compensated disease.  Recommendations:  Continue to hold anticoagulants as deemed medically feasible by primary team. May use IV unfractionated heparin  gtt, if needed and stop 1-2 hours prior to EGD. D/W primary team. Agree with IV acid suprression and IV octreotide .  Serial H/H. EGD when clinically feasible. The patient understands the nature of the planned procedure, indications, risks, alternatives and potential complications including but not  limited to bleeding, infection, perforation, damage to internal organs and possible oversedation/side effects from anesthesia. The patient agrees and gives consent to proceed.  Please refer to procedure notes for findings, recommendations and patient disposition/instructions.   Thank you for the consult. Please call with questions or concerns.  Aundria Ladell Eck MD [facility] [address] [place], [place] [address] [phone]  06/08/2024 11:21 AM

## 2024-06-08 NOTE — Progress Notes (Signed)
 Progress Note   Patient: Tammy Arias FMW:[id number] DOB: 07/19/1956 DOA: 06/07/2024     1 DOS: the patient was seen and examined on 06/08/2024   Brief hospital course: From HPI Tammy Arias is a 68 y.o. female with medical history significant of liver cirrhosis with esophageal varices, AVM, GIB, HTN, HLD, DM, CAD with CABG, sCHF with EF35-40%, TIA, hypothyroidism, gout, GERD, OSA on CPAP, anemia, A-fib on plavix and ASA (s/p of Watchman procedure), pulmonary hypertension, IBS, Cor Atrarium (s/p of repair), lumbar stenosis, pancolitis and C. difficile colitis (s/p of total colectomy and ileostomy April 2025), who presents with dark bloody loose stool.  Patient used to be on Eliquis  which was switched to Plavix and aspirin  on 05/30/2024 by her cardiologist.    Assessment and Plan:  Acute GI bleeding, iron  deficiency anemia and acute blood loss anemia:  Hemoglobin today noted to be 6.8 Patient has 2 units of packed RBC ordered for transfusion -Continue to hold Plavix and aspirin  Plan of care discussed with gastroenterologist Continue IV PPI therapy as well as octreotide  - Zofran  IV for nausea Continue to avoid NSAIDs and SQ heparin  Monitor blood pressure as well as CBC closely   Cirrhosis and Portal hypertension  on CT(HCC): Mental status normal - Monitor closely   Thrombocytopenia (HCC): Platelet 105, this is chronic issue, likely related to cirrhosis. - Follow-up with CBC   CAD with hx of CABG: No chest pain -Hold aspirin  and Plavix - Continue Crestor    Hypothyroidism - Continue Synthroid    Chronic systolic CHF (congestive heart failure) (HCC) - LVEF 35 to 40%: 2D echo on 03/07/2024 showed EF 35-40%.  No SOB or leg edema.  CHF seem to be compensated.  BNP 125. -Temporarily hold spironolactone  and torsemide  in the setting of relative hypotension   HTN (hypertension) -IV hydralazine  as needed   Hyperlipidemia -Crestor    Atrial fibrillation, chronic (HCC): Heart rate 104- >  99 -Hold Plavix and aspirin  - Continue as needed metoprolol  2.5 mg every 2 hours for heart rate higher than 125   Gout -Allopurinol    Type 2 diabetes mellitus with chronic kidney disease, without long-term current use of insulin  [facility]): Recent A1c 5.9, well-controlled.  Patient takes metformin  at home - Continue SSI   GERD without esophagitis -On Protonix  IV as above   Ileostomy in place [facility]): No acute issues.   OSA - on CPAP  DVT ppx: SCD   Code Status: Full code     Disposition Plan:  Anticipate discharge back to previous environment   Consults called: Dr. Aundria for GI  Subjective:  Patient seen and examined at bedside this morning Denies any active bloody stools Has been seen by GI who is planning endoscopy Denies nausea vomiting abdominal pain chest pain  Physical Exam: General: Not in acute distress Heme: No neck lymph node enlargement. Cardiac: S1/S2, irregularly irregular rhythm, no gallops or rubs. Respiratory: No rales, wheezing, rhonchi or rubs. GI:  Has ileostomy bag in place, stool now appears greenish GU: No hematuria Ext: No pitting leg edema bilaterally. 1+DP/PT pulse bilaterally. Musculoskeletal: No joint deformities, No joint redness or warmth, no limitation of ROM in spin. Skin: No rashes.  Neuro: Alert, oriented X3, cranial nerves II-XII grossly intact, moves all extremities normally.  Psych: Patient is not p  Vitals:   06/08/24 1055 06/08/24 1115 06/08/24 1330 06/08/24 1331  BP: (!) 90/51 (!) 95/48 (!) 111/90 (!) 111/90  Pulse: 91 87  82  Resp: 15 15  18   Temp:  98.2 F (36.8 C)  98.2 F (36.8 C) 98.2 F (36.8 C)  TempSrc: Oral   Oral  SpO2:  100%  100%  Weight:      Height:        Data Reviewed:    Latest Ref Rng & Units 06/08/2024    9:39 AM 06/08/2024    3:57 AM 06/07/2024   10:56 PM  CBC  WBC 4.0 - 10.5 K/uL 5.3  4.7  5.1   Hemoglobin 12.0 - 15.0 g/dL 7.6  6.8  7.8   Hematocrit 36.0 - 46.0 % 24.3  20.9  24.2   Platelets 150 -  400 K/uL 92  74  93        Latest Ref Rng & Units 06/08/2024    5:37 AM 06/07/2024    7:29 PM 04/07/2024    4:58 AM  BMP  Glucose 70 - 99 mg/dL 849  871  877   BUN 8 - 23 mg/dL 21  19  15    Creatinine 0.44 - 1.00 mg/dL 8.90  8.97  9.31   Sodium 135 - 145 mmol/L 141  137  134   Potassium 3.5 - 5.1 mmol/L 4.7  4.3  4.7   Chloride 98 - 111 mmol/L 111  104  91   CO2 22 - 32 mmol/L 23  23  34   Calcium  8.9 - 10.3 mg/dL 8.1  9.2  9.1     Family Communication: None present at bedside  Disposition: Hopefully home when medically stable  Time spent: 52 minutes  Author: Drue ONEIDA Potter, MD 06/08/2024 2:31 PM  For on call review [email or web address].

## 2024-06-08 NOTE — ED Notes (Signed)
 This RN assessed ostomy bag. Bag has dried dark red clumps inside but does not need to be emptied. Pt denies any pain. POC discussed with pt. Cb within reach. NAD

## 2024-06-09 DIAGNOSIS — K922 Gastrointestinal hemorrhage, unspecified: Secondary | ICD-10-CM | POA: Diagnosis not present

## 2024-06-09 LAB — CBC WITH DIFFERENTIAL/PLATELET
Abs Immature Granulocytes: 0.02 K/uL (ref 0.00–0.07)
Basophils Absolute: 0 K/uL (ref 0.0–0.1)
Basophils Relative: 1 %
Eosinophils Absolute: 0.5 K/uL (ref 0.0–0.5)
Eosinophils Relative: 15 %
HCT: 28 % — ABNORMAL LOW (ref 36.0–46.0)
Hemoglobin: 9.4 g/dL — ABNORMAL LOW (ref 12.0–15.0)
Immature Granulocytes: 1 %
Lymphocytes Relative: 10 %
Lymphs Abs: 0.3 K/uL — ABNORMAL LOW (ref 0.7–4.0)
MCH: 33.7 pg (ref 26.0–34.0)
MCHC: 33.6 g/dL (ref 30.0–36.0)
MCV: 100.4 fL — ABNORMAL HIGH (ref 80.0–100.0)
Monocytes Absolute: 0.4 K/uL (ref 0.1–1.0)
Monocytes Relative: 11 %
Neutro Abs: 2.2 K/uL (ref 1.7–7.7)
Neutrophils Relative %: 62 %
Platelets: 69 K/uL — ABNORMAL LOW (ref 150–400)
RBC: 2.79 MIL/uL — ABNORMAL LOW (ref 3.87–5.11)
RDW: 17.5 % — ABNORMAL HIGH (ref 11.5–15.5)
WBC: 3.5 K/uL — ABNORMAL LOW (ref 4.0–10.5)
nRBC: 0 % (ref 0.0–0.2)

## 2024-06-09 LAB — GLUCOSE, CAPILLARY
Glucose-Capillary: 110 mg/dL — ABNORMAL HIGH (ref 70–99)
Glucose-Capillary: 206 mg/dL — ABNORMAL HIGH (ref 70–99)
Glucose-Capillary: 82 mg/dL (ref 70–99)
Glucose-Capillary: 164 mg/dL — ABNORMAL HIGH (ref 70–99)

## 2024-06-09 LAB — BASIC METABOLIC PANEL WITH GFR
Anion gap: 6 (ref 5–15)
BUN: 15 mg/dL (ref 8–23)
CO2: 23 mmol/L (ref 22–32)
Calcium: 7.7 mg/dL — ABNORMAL LOW (ref 8.9–10.3)
Chloride: 107 mmol/L (ref 98–111)
Creatinine, Ser: 0.83 mg/dL (ref 0.44–1.00)
GFR, Estimated: >60 mL/min (ref >60)
Glucose, Bld: 126 mg/dL — ABNORMAL HIGH (ref 70–99)
Potassium: 4.3 mmol/L (ref 3.5–5.1)
Sodium: 136 mmol/L (ref 135–145)

## 2024-06-09 NOTE — Progress Notes (Signed)
 [facility] Inpatient Progress Note    Subjective: Patient seen for f/u melena, blood in ileostomy bag. No further bleeding from ostomy bag since yesterday AM. Today I day #3 off PLAVIX.Patient c/o mild diffuse abdominal pressure but no frank swelling of the extremities.  Objective: Vital signs in last 24 hours: Temp:  [98 F (36.7 C)-98.6 F (37 C)] 98.4 F (36.9 C) (07/13 0731) Pulse Rate:  [67-78] 77 (07/13 0731) Resp:  [16-19] 18 (07/13 0731) BP: (96-112)/(51-63) 105/63 (07/13 0731) SpO2:  [98 %-100 %] 99 % (07/13 0731) Weight:  [59.9 kg] 59.9 kg (07/13 0500) Blood pressure 105/63, pulse 77, temperature 98.4 F (36.9 C), temperature source Oral, resp. rate 18, height 5' 1 (1.549 m), weight 59.9 kg, SpO2 99%.    Intake/Output from previous day: 07/12 0701 - 07/13 0700 In: 2140.6 [P.O.:400; I.V.:600.6; Blood:640; IV Piggyback:500] Out: 100 [Stool:100]  Intake/Output this shift: Total I/O In: 897.7 [P.O.:670; I.V.:227.7] Out: -    Gen: NAD. Appears comfortable.  HEENT: /AT. PERRLA. Normal external ear exam.  Chest: CTA, no wheezes.  CV: RR nl S1, S2. No gallops.  Abd: soft, nt, mildly distended. Intact ileostomy with Ostomy bag functioning with stool present.  BS+  Ext: no edema. Pulses 2+  Neuro: Alert and oriented. Judgement appears normal. Nonfocal.   Lab Results: Results for orders placed or performed during the hospital encounter of 06/07/24 (from the past 24 hours)  Gastrointestinal Panel by PCR , Stool     Status: None   Collection Time: 06/08/24  3:37 PM   Specimen: Stool  Result Value Ref Range   Campylobacter species NOT DETECTED NOT DETECTED   Plesimonas shigelloides NOT DETECTED NOT DETECTED   Salmonella species NOT DETECTED NOT DETECTED   Yersinia enterocolitica NOT DETECTED NOT DETECTED   Vibrio species NOT DETECTED NOT DETECTED   Vibrio cholerae NOT DETECTED NOT DETECTED   Enteroaggregative E coli (EAEC) NOT DETECTED  NOT DETECTED   Enteropathogenic E coli (EPEC) NOT DETECTED NOT DETECTED   Enterotoxigenic E coli (ETEC) NOT DETECTED NOT DETECTED   Shiga like toxin producing E coli (STEC) NOT DETECTED NOT DETECTED   Shigella/Enteroinvasive E coli (EIEC) NOT DETECTED NOT DETECTED   Cryptosporidium NOT DETECTED NOT DETECTED   Cyclospora cayetanensis NOT DETECTED NOT DETECTED   Entamoeba histolytica NOT DETECTED NOT DETECTED   Giardia lamblia NOT DETECTED NOT DETECTED   Adenovirus F40/41 NOT DETECTED NOT DETECTED   Astrovirus NOT DETECTED NOT DETECTED   Norovirus GI/GII NOT DETECTED NOT DETECTED   Rotavirus A NOT DETECTED NOT DETECTED   Sapovirus (I, II, IV, and V) NOT DETECTED NOT DETECTED  CBC     Status: Abnormal   Collection Time: 06/08/24  4:02 PM  Result Value Ref Range   WBC 5.1 4.0 - 10.5 K/uL   RBC 2.61 (L) 3.87 - 5.11 MIL/uL   Hemoglobin 8.7 (L) 12.0 - 15.0 g/dL   HCT 73.2 (L) 63.9 - 53.9 %   MCV 102.3 (H) 80.0 - 100.0 fL   MCH 33.3 26.0 - 34.0 pg   MCHC 32.6 30.0 - 36.0 g/dL   RDW 83.3 (H) 88.4 - 84.4 %   Platelets 77 (L) 150 - 400 K/uL   nRBC 0.0 0.0 - 0.2 %  Glucose, capillary     Status: None   Collection Time: 06/08/24  4:09 PM  Result Value Ref Range   Glucose-Capillary 95 70 - 99 mg/dL   Comment 1 Notify RN    Comment 2 Document  in Chart   CBC     Status: Abnormal   Collection Time: 06/08/24  9:50 PM  Result Value Ref Range   WBC 4.9 4.0 - 10.5 K/uL   RBC 2.76 (L) 3.87 - 5.11 MIL/uL   Hemoglobin 9.0 (L) 12.0 - 15.0 g/dL   HCT 72.5 (L) 63.9 - 53.9 %   MCV 99.3 80.0 - 100.0 fL   MCH 32.6 26.0 - 34.0 pg   MCHC 32.8 30.0 - 36.0 g/dL   RDW 82.7 (H) 88.4 - 84.4 %   Platelets 76 (L) 150 - 400 K/uL   nRBC 0.0 0.0 - 0.2 %  Glucose, capillary     Status: Abnormal   Collection Time: 06/08/24  9:53 PM  Result Value Ref Range   Glucose-Capillary 149 (H) 70 - 99 mg/dL  Basic metabolic panel     Status: Abnormal   Collection Time: 06/09/24  5:45 AM  Result Value Ref Range    Sodium 136 135 - 145 mmol/L   Potassium 4.3 3.5 - 5.1 mmol/L   Chloride 107 98 - 111 mmol/L   CO2 23 22 - 32 mmol/L   Glucose, Bld 126 (H) 70 - 99 mg/dL   BUN 15 8 - 23 mg/dL   Creatinine, Ser 9.16 0.44 - 1.00 mg/dL   Calcium  7.7 (L) 8.9 - 10.3 mg/dL   GFR, Estimated >39 >39 mL/min   Anion gap 6 5 - 15  CBC with Differential/Platelet     Status: Abnormal   Collection Time: 06/09/24  5:45 AM  Result Value Ref Range   WBC 3.5 (L) 4.0 - 10.5 K/uL   RBC 2.79 (L) 3.87 - 5.11 MIL/uL   Hemoglobin 9.4 (L) 12.0 - 15.0 g/dL   HCT 71.9 (L) 63.9 - 53.9 %   MCV 100.4 (H) 80.0 - 100.0 fL   MCH 33.7 26.0 - 34.0 pg   MCHC 33.6 30.0 - 36.0 g/dL   RDW 82.4 (H) 88.4 - 84.4 %   Platelets 69 (L) 150 - 400 K/uL   nRBC 0.0 0.0 - 0.2 %   Neutrophils Relative % 62 %   Neutro Abs 2.2 1.7 - 7.7 K/uL   Lymphocytes Relative 10 %   Lymphs Abs 0.3 (L) 0.7 - 4.0 K/uL   Monocytes Relative 11 %   Monocytes Absolute 0.4 0.1 - 1.0 K/uL   Eosinophils Relative 15 %   Eosinophils Absolute 0.5 0.0 - 0.5 K/uL   Basophils Relative 1 %   Basophils Absolute 0.0 0.0 - 0.1 K/uL   Immature Granulocytes 1 %   Abs Immature Granulocytes 0.02 0.00 - 0.07 K/uL  Glucose, capillary     Status: Abnormal   Collection Time: 06/09/24  7:29 AM  Result Value Ref Range   Glucose-Capillary 110 (H) 70 - 99 mg/dL  Glucose, capillary     Status: Abnormal   Collection Time: 06/09/24 11:28 AM  Result Value Ref Range   Glucose-Capillary 206 (H) 70 - 99 mg/dL     Recent Labs    92/87/74 1602 06/08/24 2150 06/09/24 0545  WBC 5.1 4.9 3.5*  HGB 8.7* 9.0* 9.4*  HCT 26.7* 27.4* 28.0*  PLT 77* 76* 69*   BMET Recent Labs    06/07/24 1929 06/08/24 0537 06/09/24 0545  NA 137 141 136  K 4.3 4.7 4.3  CL 104 111 107  CO2 23 23 23   GLUCOSE 128* 150* 126*  BUN 19 21 15   CREATININE 1.02* 1.09* 0.83  CALCIUM  9.2 8.1* 7.7*  LFT Recent Labs    06/07/24 1929  PROT 7.3  ALBUMIN  4.1  AST 40  ALT 22  ALKPHOS 124  BILITOT 0.9    PT/INR Recent Labs    06/07/24 1930  LABPROT 15.1  INR 1.1   Hepatitis Panel No results for input(s): HEPBSAG, HCVAB, HEPAIGM, HEPBIGM in the last 72 hours. C-Diff No results for input(s): CDIFFTOX in the last 72 hours. No results for input(s): CDIFFPCR in the last 72 hours.   Studies/Results: No results found.  Scheduled Inpatient Medications:    allopurinol   300 mg Oral Daily   ascorbic acid   500 mg Oral Daily   insulin  aspart  0-5 Units Subcutaneous QHS   insulin  aspart  0-9 Units Subcutaneous TID WC   iron  polysaccharides  150 mg Oral Daily   latanoprost   1 drop Both Eyes QHS   levothyroxine   50 mcg Oral Q0600   magnesium  oxide  400 mg Oral Daily   pantoprazole  (PROTONIX ) IV  40 mg Intravenous Q12H   rosuvastatin   5 mg Oral Daily    Continuous Inpatient Infusions:    octreotide  (SANDOSTATIN ) 500 mcg in sodium chloride  0.9 % 250 mL (2 mcg/mL) infusion 50 mcg/hr (06/09/24 0916)    PRN Inpatient Medications:  acetaminophen , metoprolol  tartrate, ondansetron  (ZOFRAN ) IV  Miscellaneous: Nn/A  Assessment:  Patient Active Problem List   Diagnosis Date Noted   GI bleeding 06/07/2024   Acute blood loss anemia 06/07/2024   Irritant contact dermatitis associated with fecal stoma 04/14/2024   Ileostomy care (HCC) 04/14/2024   Dehydration 04/14/2024   Hypokalemia 04/06/2024   Ileostomy in place (HCC) 04/04/2024   Status post total colectomy 04/04/2024   Chronic metabolic acidosis 04/04/2024   Enteritis due to Norovirus 04/04/2024   Elevated LFTs 04/04/2024   Glaucoma 04/04/2024   GERD without esophagitis 03/29/2024   Type 2 diabetes mellitus with chronic kidney disease, without long-term current use of insulin  (HCC) 03/29/2024   Dyslipidemia 03/29/2024   Chronic systolic CHF (congestive heart failure) (HCC) - LVEF 35 to 40% 03/17/2024   HTN (hypertension) 03/17/2024   Hypothyroidism 03/17/2024   Gout 03/17/2024   Hyponatremia 03/17/2024    Hypomagnesemia 03/17/2024   Acute kidney injury superimposed on stage 3a chronic kidney disease (HCC) - baseline scr 1.2 03/17/2024   Chronic diastolic CHF (congestive heart failure) (HCC) 06/07/2023   Chronic anticoagulation - on Eliquis  for chronic afib 06/07/2023   Cirrhosis and Portal hypertension  on CT(HCC) 06/07/2023   Thrombocytopenia (HCC) 06/06/2023   CAD with hx of CABG 06/06/2023   OSA on CPAP 05/23/2023   S/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, right 04/05/2022   S/P TKR (total knee replacement) 04/05/2022   Colon cancer screening    Iron  deficiency anemia    Gastric AVM    Barrett's esophagus without dysplasia    Angiodysplasia of intestinal tract    Internal hemorrhoids    Pulmonary hypertension (HCC) 03/29/2017   Coronary artery disease involving native coronary artery of native heart with unstable angina pectoris (HCC) 03/21/2017   Ischemic cardiomyopathy 03/21/2017   Atrial fibrillation, chronic (HCC) 10/08/2014   Hyperlipidemia 10/08/2014   Type 2 diabetes mellitus (HCC) 10/08/2014  Gastrointestinal bleeding, likely UGI source. Ddx includes esophageal and/or gastric varices, GAVE, Peptic ulcer disease, Malignancy, etc. Currently stable. Chronic anticoagulant use - Plavix and aspirin . Medication on hold for past 48 hrs. sCHF 4.   Thrombocytopenia presumably secondary to cirrhosis. 5.    Child Pugh Class A cirrhosis with 5 pts. MELD  score = 11. Consistent with well-compensated disease. 6.   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    Plan:  Advance to full liquid diet today Continue to hold Plavix. EGD when clinically feasible. Dr. Jinny. Following along  Dajour Pierpoint K. Aundria, M.D. 06/09/2024, 3:10 PM

## 2024-06-09 NOTE — Plan of Care (Signed)

## 2024-06-09 NOTE — Progress Notes (Signed)
 Progress Note   Patient: Tammy Arias FMW:[id number] DOB: 04-Mar-1956 DOA: 06/07/2024     2 DOS: the patient was seen and examined on 06/09/2024   Brief hospital course: From HPI Tammy Arias is a 68 y.o. female with medical history significant of liver cirrhosis with esophageal varices, AVM, GIB, HTN, HLD, DM, CAD with CABG, sCHF with EF35-40%, TIA, hypothyroidism, gout, GERD, OSA on CPAP, anemia, A-fib on plavix and ASA (s/p of Watchman procedure), pulmonary hypertension, IBS, Cor Atrarium (s/p of repair), lumbar stenosis, pancolitis and C. difficile colitis (s/p of total colectomy and ileostomy April 2025), who presents with dark bloody loose stool.  Patient used to be on Eliquis  which was switched to Plavix and aspirin  on 05/30/2024 by her cardiologist.     Assessment and Plan:   Acute GI bleeding, iron  deficiency anemia and acute blood loss anemia:  Hemoglobin on 06/08/2024 noted to be 6.8 Status post 2 units of packed RBC  -Continue to hold Plavix and aspirin  Plan of care discussed with gastroenterologist Continue IV PPI therapy as well as octreotide  Gastroenterologist on board and planning possible EGD Continue to avoid NSAIDs and SQ heparin  Monitor blood pressure as well as CBC closely   Cirrhosis and Portal hypertension  on CT(HCC): Mental status normal - Monitor closely   Thrombocytopenia (HCC): Platelet 105, this is chronic issue, likely related to cirrhosis. Continue to monitor CBC closely   CAD with hx of CABG: No chest pain -Hold aspirin  and Plavix - Continue Crestor    Hypothyroidism - Continue Synthroid    Chronic systolic CHF (congestive heart failure) (HCC) - LVEF 35 to 40%: 2D echo on 03/07/2024 showed EF 35-40%.  No SOB or leg edema.  CHF seem to be compensated.  BNP 125. -Temporarily hold spironolactone  and torsemide  in the setting of relative hypotension   HTN (hypertension) -IV hydralazine  as needed   Hyperlipidemia -Crestor    Atrial fibrillation,  chronic (HCC): Heart rate 104- > 99 -Hold Plavix and aspirin  - Continue as needed metoprolol  2.5 mg every 2 hours for heart rate higher than 125   Gout - Continue allopurinol    Type 2 diabetes mellitus with chronic kidney disease, without long-term current use of insulin  [facility]): Recent A1c 5.9, well-controlled.  Patient takes metformin  at home - Continue SSI   GERD without esophagitis -On Protonix  IV as above   Ileostomy in place [facility]): No acute issues.   OSA - on CPAP   DVT ppx: SCD   Code Status: Full code      Disposition Plan:  Anticipate discharge back to previous environment   Consults called: Dr. Aundria for GI   Subjective:  Patient seen and examined at bedside this morning Denies nausea vomiting abdominal pain GI have advanced patient's diet GI is looking for an option for EGD whenever feasible   Physical Exam: General: Not in acute distress Heme: No neck lymph node enlargement. Cardiac: S1/S2, irregularly irregular rhythm, no gallops or rubs. Respiratory: No rales, wheezing, rhonchi or rubs. GI:  Has ileostomy bag in place, stool now appears greenish GU: No hematuria Ext: No pitting leg edema bilaterally. 1+DP/PT pulse bilaterally. Musculoskeletal: No joint deformities, No joint redness or warmth, no limitation of ROM in spin. Skin: No rashes.  Neuro: Alert, oriented X3, cranial nerves II-XII grossly intact, moves all extremities normally.  Psych: Patient is not p   Family Communication: None present at bedside   Disposition: Hopefully home when medically stable   Time spent: 40  minutes  Data Reviewed:       Latest Ref Rng & Units 06/09/2024    5:45 AM 06/08/2024    9:50 PM 06/08/2024    4:02 PM  CBC  WBC 4.0 - 10.5 K/uL 3.5  4.9  5.1   Hemoglobin 12.0 - 15.0 g/dL 9.4  9.0  8.7   Hematocrit 36.0 - 46.0 % 28.0  27.4  26.7   Platelets 150 - 400 K/uL 69  76  77        Latest Ref Rng & Units 06/09/2024    5:45 AM 06/08/2024    5:37 AM 06/07/2024     7:29 PM  BMP  Glucose 70 - 99 mg/dL 873  849  871   BUN 8 - 23 mg/dL 15  21  19    Creatinine 0.44 - 1.00 mg/dL 9.16  8.90  8.97   Sodium 135 - 145 mmol/L 136  141  137   Potassium 3.5 - 5.1 mmol/L 4.3  4.7  4.3   Chloride 98 - 111 mmol/L 107  111  104   CO2 22 - 32 mmol/L 23  23  23    Calcium  8.9 - 10.3 mg/dL 7.7  8.1  9.2     Vitals:   06/09/24 0335 06/09/24 0500 06/09/24 0731 06/09/24 1540  BP: 107/62  105/63 95/67  Pulse: 76  77 68  Resp: 16  18 16   Temp: 98 F (36.7 C)  98.4 F (36.9 C) 97.8 F (36.6 C)  TempSrc: Oral  Oral   SpO2: 100%  99% 100%  Weight:  59.9 kg    Height:         Author: Drue ONEIDA Potter, MD 06/09/2024 4:55 PM  For on call review [email or web address].

## 2024-06-09 NOTE — Care Management Important Message (Signed)
 Important Message  Patient Details  Name: Tammy Arias MRN: [id number] Date of Birth: 12/07/1955   Important Message Given:  Yes - Medicare IM     Rojelio [name] 06/09/2024, 2:37 PM

## 2024-06-10 DIAGNOSIS — K921 Melena: Secondary | ICD-10-CM | POA: Diagnosis not present

## 2024-06-10 DIAGNOSIS — K922 Gastrointestinal hemorrhage, unspecified: Secondary | ICD-10-CM | POA: Diagnosis not present

## 2024-06-10 LAB — CBC WITH DIFFERENTIAL/PLATELET
Abs Immature Granulocytes: 0.01 K/uL (ref 0.00–0.07)
Basophils Absolute: 0 K/uL (ref 0.0–0.1)
Basophils Relative: 0 %
Eosinophils Absolute: 0.3 K/uL (ref 0.0–0.5)
Eosinophils Relative: 12 %
HCT: 27.2 % — ABNORMAL LOW (ref 36.0–46.0)
Hemoglobin: 9 g/dL — ABNORMAL LOW (ref 12.0–15.0)
Immature Granulocytes: 0 %
Lymphocytes Relative: 11 %
Lymphs Abs: 0.3 K/uL — ABNORMAL LOW (ref 0.7–4.0)
MCH: 33.3 pg (ref 26.0–34.0)
MCHC: 33.1 g/dL (ref 30.0–36.0)
MCV: 100.7 fL — ABNORMAL HIGH (ref 80.0–100.0)
Monocytes Absolute: 0.3 K/uL (ref 0.1–1.0)
Monocytes Relative: 10 %
Neutro Abs: 1.6 K/uL — ABNORMAL LOW (ref 1.7–7.7)
Neutrophils Relative %: 67 %
Platelets: 61 K/uL — ABNORMAL LOW (ref 150–400)
RBC: 2.7 MIL/uL — ABNORMAL LOW (ref 3.87–5.11)
RDW: 17 % — ABNORMAL HIGH (ref 11.5–15.5)
WBC: 2.5 K/uL — ABNORMAL LOW (ref 4.0–10.5)
nRBC: 0 % (ref 0.0–0.2)

## 2024-06-10 LAB — GLUCOSE, CAPILLARY
Glucose-Capillary: 187 mg/dL — ABNORMAL HIGH (ref 70–99)
Glucose-Capillary: 156 mg/dL — ABNORMAL HIGH (ref 70–99)
Glucose-Capillary: 105 mg/dL — ABNORMAL HIGH (ref 70–99)
Glucose-Capillary: 143 mg/dL — ABNORMAL HIGH (ref 70–99)

## 2024-06-10 LAB — BASIC METABOLIC PANEL WITH GFR
Anion gap: 6 (ref 5–15)
BUN: 13 mg/dL (ref 8–23)
CO2: 21 mmol/L — ABNORMAL LOW (ref 22–32)
Calcium: 8 mg/dL — ABNORMAL LOW (ref 8.9–10.3)
Chloride: 110 mmol/L (ref 98–111)
Creatinine, Ser: 0.82 mg/dL (ref 0.44–1.00)
GFR, Estimated: >60 mL/min (ref >60)
Glucose, Bld: 128 mg/dL — ABNORMAL HIGH (ref 70–99)
Potassium: 4.6 mmol/L (ref 3.5–5.1)
Sodium: 137 mmol/L (ref 135–145)

## 2024-06-10 LAB — FOLATE: Folate: 16.5 ng/mL (ref >5.9)

## 2024-06-10 LAB — IRON AND TIBC
Iron: 85 ug/dL (ref 28–170)
Saturation Ratios: 22 % (ref 10.4–31.8)
TIBC: 379 ug/dL (ref 250–450)
UIBC: 294 ug/dL

## 2024-06-10 LAB — VITAMIN B12: Vitamin B-12: 469 pg/mL (ref 180–914)

## 2024-06-10 LAB — FERRITIN: Ferritin: 54 ng/mL (ref 11–307)

## 2024-06-10 NOTE — Progress Notes (Signed)
 Mobility Specialist - Progress Note   06/10/24 1536  Mobility  Activity Ambulated independently in hallway  Level of Assistance Independent  Assistive Device None (pushing IV pole)  Distance Ambulated (ft) 160 ft  Activity Response Tolerated well  Mobility visit 1 Mobility  Mobility Specialist Start Time (ACUTE ONLY) 1530  Mobility Specialist Stop Time (ACUTE ONLY) 1536  Mobility Specialist Time Calculation (min) (ACUTE ONLY) 6 min   Pt amb one lap around the NS indep no AD, pushing the IV pole. Pt returned to the room, left seated EOB with needs within reach.  Tammy Arias Mobility Specialist 06/10/24 3:41 PM

## 2024-06-10 NOTE — Progress Notes (Signed)
 Tammy Copping, MD [facility]   [address] [place], [place] [address] Phone: [phone] Fax : [phone]   Subjective: The patient reports that she has had no further blood in her ostomy bag and only about 2 drops of blood from her rectum.  She denies any abdominal pain nausea vomiting fevers or chills.  The patient had a hemoglobin yesterday of 9.4 which is 9.0 today.   Objective: Vital signs in last 24 hours: Vitals:   06/09/24 2024 06/10/24 0442 06/10/24 0600 06/10/24 0811  BP: 107/70  (P) 105/73 110/70  Pulse: 66  (P) 75 75  Resp: 17  (P) 16 17  Temp: 98.3 F (36.8 C)  (P) 97.8 F (36.6 C) 97.7 F (36.5 C)  TempSrc: Oral  (P) Oral Oral  SpO2: 97%  (P) 100% 100%  Weight:  62.6 kg    Height:       Weight change: 2.7 kg  Intake/Output Summary (Last 24 hours) at 06/10/2024 1241 Last data filed at 06/10/2024 0600 Gross per 24 hour  Intake 1286.61 ml  Output --  Net 1286.61 ml     Exam: General: Patient sitting up in bed in no apparent distress.   Lab Results: @LABTEST2 @ Micro Results: Recent Results (from the past 240 hours)  Gastrointestinal Panel by PCR , Stool     Status: None   Collection Time: 06/08/24  3:37 PM   Specimen: Stool  Result Value Ref Range Status   Campylobacter species NOT DETECTED NOT DETECTED Final   Plesimonas shigelloides NOT DETECTED NOT DETECTED Final   Salmonella species NOT DETECTED NOT DETECTED Final   Yersinia enterocolitica NOT DETECTED NOT DETECTED Final   Vibrio species NOT DETECTED NOT DETECTED Final   Vibrio cholerae NOT DETECTED NOT DETECTED Final   Enteroaggregative E coli (EAEC) NOT DETECTED NOT DETECTED Final   Enteropathogenic E coli (EPEC) NOT DETECTED NOT DETECTED Final   Enterotoxigenic E coli (ETEC) NOT DETECTED NOT DETECTED Final   Shiga like toxin producing E coli (STEC) NOT DETECTED NOT DETECTED Final   Shigella/Enteroinvasive E coli (EIEC) NOT DETECTED NOT DETECTED Final   Cryptosporidium NOT DETECTED  NOT DETECTED Final   Cyclospora cayetanensis NOT DETECTED NOT DETECTED Final   Entamoeba histolytica NOT DETECTED NOT DETECTED Final   Giardia lamblia NOT DETECTED NOT DETECTED Final   Adenovirus F40/41 NOT DETECTED NOT DETECTED Final   Astrovirus NOT DETECTED NOT DETECTED Final   Norovirus GI/GII NOT DETECTED NOT DETECTED Final   Rotavirus A NOT DETECTED NOT DETECTED Final   Sapovirus (I, II, IV, and V) NOT DETECTED NOT DETECTED Final    Comment: Performed at [facility], [address], [place], [place] [address]   Studies/Results: No results found. Medications: I have reviewed the patient's current medications. Scheduled Meds:  allopurinol   300 mg Oral Daily   ascorbic acid   500 mg Oral Daily   insulin  aspart  0-5 Units Subcutaneous QHS   insulin  aspart  0-9 Units Subcutaneous TID WC   iron  polysaccharides  150 mg Oral Daily   latanoprost   1 drop Both Eyes QHS   levothyroxine   50 mcg Oral Q0600   magnesium  oxide  400 mg Oral Daily   pantoprazole  (PROTONIX ) IV  40 mg Intravenous Q12H   rosuvastatin   5 mg Oral Daily   Continuous Infusions:  octreotide  (SANDOSTATIN ) 500 mcg in sodium chloride  0.9 % 250 mL (2 mcg/mL) infusion 50 mcg/hr (06/10/24 0650)   PRN Meds:.acetaminophen , metoprolol  tartrate, ondansetron  (ZOFRAN ) IV  Assessment: Principal Problem:   GI bleeding Active Problems:   Atrial fibrillation, chronic (HCC)   Iron  deficiency anemia   Hyperlipidemia   OSA on CPAP   Thrombocytopenia (HCC)   CAD with hx of CABG   Cirrhosis and Portal hypertension  on CT(HCC)   Chronic systolic CHF (congestive heart failure) (HCC) - LVEF 35 to 40%   HTN (hypertension)   Hypothyroidism   Gout   GERD without esophagitis   Type 2 diabetes mellitus with chronic kidney disease, without long-term current use of insulin  (HCC)   Ileostomy in place [facility])   Acute blood loss anemia    Plan: With a history of cirrhosis and what appears to be maroon stools in her ostomy  bag.  The patient will be set up for an upper endoscopy for tomorrow.  She has had no further sign of any GI bleeding.  The patient has been explained the plan and agrees with it.   LOS: 3 days   Tammy Copping, MD.FACG 06/10/2024, 12:41 PM Pager [phone] 7am-5pm  Check AMION for 5pm -7am coverage and on weekends

## 2024-06-10 NOTE — Progress Notes (Signed)
 Progress Note   Patient: Tammy Arias FMW:[id number] DOB: 04-09-1956 DOA: 06/07/2024     3 DOS: the patient was seen and examined on 06/10/2024    Brief hospital course: From HPI Tammy Arias is a 68 y.o. female with medical history significant of liver cirrhosis with esophageal varices, AVM, GIB, HTN, HLD, DM, CAD with CABG, sCHF with EF35-40%, TIA, hypothyroidism, gout, GERD, OSA on CPAP, anemia, A-fib on plavix and ASA (s/p of Watchman procedure), pulmonary hypertension, IBS, Cor Atrarium (s/p of repair), lumbar stenosis, pancolitis and C. difficile colitis (s/p of total colectomy and ileostomy April 2025), who presents with dark bloody loose stool.  Patient used to be on Eliquis  which was switched to Plavix and aspirin  on 05/30/2024 by her cardiologist.     Assessment and Plan:   Acute GI bleeding, iron  deficiency anemia and acute blood loss anemia:  Hemoglobin on 06/08/2024 noted to be 6.8 Status post 2 units of packed RBC  -Continue to hold Plavix and aspirin  Plan of care discussed with gastroenterologist Continue PPI therapy Continue octreotide  till 8 PM today as recommended by GI Case discussed with gastroenterology Dr. Alena who is planning EGD tomorrow Continue to avoid NSAIDs and SQ heparin  Monitor blood pressure as well as CBC closely   Cirrhosis and Portal hypertension  on CT(HCC): Mental status normal - Monitor closely   Pancytopenia Thrombocytopenia (HCC):  Patient has chronic thrombocytopenia however noted to also have leukopenia and anemia Follow-up on anemia panel Patient will benefit from hematology follow-up at discharge   CAD with hx of CABG: No chest pain -Hold aspirin  and Plavix - Continue Crestor    Hypothyroidism - Continue Synthroid    Chronic systolic CHF (congestive heart failure) (HCC) - LVEF 35 to 40%: 2D echo on 03/07/2024 showed EF 35-40%.  No SOB or leg edema.  CHF seem to be compensated.  BNP 125. -Temporarily hold spironolactone  and torsemide  in  the setting of relative hypotension   HTN (hypertension) -IV hydralazine  as needed   Hyperlipidemia -Crestor    Atrial fibrillation, chronic (HCC): Heart rate 104- > 99 -Hold Plavix and aspirin  - Continue as needed metoprolol  2.5 mg every 2 hours for heart rate higher than 125   Gout - Continue allopurinol    Type 2 diabetes mellitus with chronic kidney disease, without long-term current use of insulin  (HCC): Recent A1c 5.9, well-controlled.  Patient takes metformin  at home Continue current insulin  therapy  GERD without esophagitis -On Protonix  IV as above   Ileostomy in place [facility]): No acute issues.   OSA - on CPAP   DVT ppx: SCD   Code Status: Full code      Disposition Plan:  Anticipate discharge back to previous environment   Consults called: Dr. Aundria for GI   Subjective:  Patient seen and examined at bedside this morning I discussed with GI who is planning EGD tomorrow Patient denies any abdominal pain nausea vomiting or external bleeding   Physical Exam: General: Not in acute distress Heme: No neck lymph node enlargement. Cardiac: S1/S2, irregularly irregular rhythm, no gallops or rubs. Respiratory: No rales, wheezing, rhonchi or rubs. GI:  Has ileostomy bag in place, stool now appears greenish GU: No hematuria Ext: No pitting leg edema bilaterally. 1+DP/PT pulse bilaterally. Musculoskeletal: No joint deformities, No joint redness or warmth, no limitation of ROM in spin. Skin: No rashes.  Neuro: Alert, oriented X3, cranial nerves II-XII grossly intact, moves all extremities normally.  Psych: Patient is not p   Family Communication: None present at  bedside   Disposition: Hopefully home when medically stable   Time spent: 40  minutes     Data Reviewed:   Vitals:   06/09/24 2024 06/10/24 0442 06/10/24 0600 06/10/24 0811  BP: 107/70  (P) 105/73 110/70  Pulse: 66  (P) 75 75  Resp: 17  (P) 16 17  Temp: 98.3 F (36.8 C)  (P) 97.8 F (36.6 C) 97.7  F (36.5 C)  TempSrc: Oral  (P) Oral Oral  SpO2: 97%  (P) 100% 100%  Weight:  62.6 kg    Height:          Latest Ref Rng & Units 06/10/2024    5:11 AM 06/09/2024    5:45 AM 06/08/2024    9:50 PM  CBC  WBC 4.0 - 10.5 K/uL 2.5  3.5  4.9   Hemoglobin 12.0 - 15.0 g/dL 9.0  9.4  9.0   Hematocrit 36.0 - 46.0 % 27.2  28.0  27.4   Platelets 150 - 400 K/uL 61  69  76        Latest Ref Rng & Units 06/10/2024    5:11 AM 06/09/2024    5:45 AM 06/08/2024    5:37 AM  BMP  Glucose 70 - 99 mg/dL 871  873  849   BUN 8 - 23 mg/dL 13  15  21    Creatinine 0.44 - 1.00 mg/dL 9.17  9.16  8.90   Sodium 135 - 145 mmol/L 137  136  141   Potassium 3.5 - 5.1 mmol/L 4.6  4.3  4.7   Chloride 98 - 111 mmol/L 110  107  111   CO2 22 - 32 mmol/L 21  23  23    Calcium  8.9 - 10.3 mg/dL 8.0  7.7  8.1      Author: Drue ONEIDA Potter, MD 06/10/2024 12:24 PM  For on call review [email or web address]

## 2024-06-11 ENCOUNTER — Encounter: Payer: Self-pay | Admitting: Internal Medicine

## 2024-06-11 ENCOUNTER — Encounter
Admission: EM | Disposition: A | Discharge status: Home / Self Care | Payer: Self-pay | Source: Home / Self Care | Attending: Internal Medicine

## 2024-06-11 ENCOUNTER — Inpatient Hospital Stay: Admitting: Anesthesiology

## 2024-06-11 DIAGNOSIS — I8500 Esophageal varices without bleeding: Secondary | ICD-10-CM

## 2024-06-11 DIAGNOSIS — K922 Gastrointestinal hemorrhage, unspecified: Secondary | ICD-10-CM | POA: Diagnosis not present

## 2024-06-11 DIAGNOSIS — K3189 Other diseases of stomach and duodenum: Secondary | ICD-10-CM

## 2024-06-11 HISTORY — PX: ESOPHAGOGASTRODUODENOSCOPY: SHX5428

## 2024-06-11 LAB — BASIC METABOLIC PANEL WITH GFR
Anion gap: 3 — ABNORMAL LOW (ref 5–15)
BUN: 13 mg/dL (ref 8–23)
CO2: 23 mmol/L (ref 22–32)
Calcium: 8.1 mg/dL — ABNORMAL LOW (ref 8.9–10.3)
Chloride: 109 mmol/L (ref 98–111)
Creatinine, Ser: 0.79 mg/dL (ref 0.44–1.00)
GFR, Estimated: >60 mL/min (ref >60)
Glucose, Bld: 106 mg/dL — ABNORMAL HIGH (ref 70–99)
Potassium: 5.1 mmol/L (ref 3.5–5.1)
Sodium: 135 mmol/L (ref 135–145)

## 2024-06-11 LAB — GLUCOSE, CAPILLARY
Glucose-Capillary: 92 mg/dL (ref 70–99)
Glucose-Capillary: 99 mg/dL (ref 70–99)
Glucose-Capillary: 123 mg/dL — ABNORMAL HIGH (ref 70–99)
Glucose-Capillary: 149 mg/dL — ABNORMAL HIGH (ref 70–99)

## 2024-06-11 LAB — CBC WITH DIFFERENTIAL/PLATELET
Abs Immature Granulocytes: 0.01 K/uL (ref 0.00–0.07)
Abs Immature Granulocytes: 0.01 K/uL (ref 0.00–0.07)
Basophils Absolute: 0 K/uL (ref 0.0–0.1)
Basophils Absolute: 0 K/uL (ref 0.0–0.1)
Basophils Relative: 1 %
Basophils Relative: 1 %
Eosinophils Absolute: 0.5 K/uL (ref 0.0–0.5)
Eosinophils Absolute: 0.4 K/uL (ref 0.0–0.5)
Eosinophils Relative: 16 %
Eosinophils Relative: 11 %
HCT: 25.5 % — ABNORMAL LOW (ref 36.0–46.0)
HCT: 29.3 % — ABNORMAL LOW (ref 36.0–46.0)
Hemoglobin: 8.3 g/dL — ABNORMAL LOW (ref 12.0–15.0)
Hemoglobin: 9.4 g/dL — ABNORMAL LOW (ref 12.0–15.0)
Immature Granulocytes: 0 %
Immature Granulocytes: 0 %
Lymphocytes Relative: 11 %
Lymphocytes Relative: 9 %
Lymphs Abs: 0.3 K/uL — ABNORMAL LOW (ref 0.7–4.0)
Lymphs Abs: 0.3 K/uL — ABNORMAL LOW (ref 0.7–4.0)
MCH: 33.1 pg (ref 26.0–34.0)
MCH: 33.6 pg (ref 26.0–34.0)
MCHC: 32.5 g/dL (ref 30.0–36.0)
MCHC: 32.1 g/dL (ref 30.0–36.0)
MCV: 101.6 fL — ABNORMAL HIGH (ref 80.0–100.0)
MCV: 104.6 fL — ABNORMAL HIGH (ref 80.0–100.0)
Monocytes Absolute: 0.4 K/uL (ref 0.1–1.0)
Monocytes Absolute: 0.3 K/uL (ref 0.1–1.0)
Monocytes Relative: 13 %
Monocytes Relative: 7 %
Neutro Abs: 1.8 K/uL (ref 1.7–7.7)
Neutro Abs: 2.7 K/uL (ref 1.7–7.7)
Neutrophils Relative %: 59 %
Neutrophils Relative %: 72 %
Platelets: 67 K/uL — ABNORMAL LOW (ref 150–400)
Platelets: 68 K/uL — ABNORMAL LOW (ref 150–400)
RBC: 2.51 MIL/uL — ABNORMAL LOW (ref 3.87–5.11)
RBC: 2.8 MIL/uL — ABNORMAL LOW (ref 3.87–5.11)
RDW: 16 % — ABNORMAL HIGH (ref 11.5–15.5)
RDW: 16.6 % — ABNORMAL HIGH (ref 11.5–15.5)
WBC: 2.9 K/uL — ABNORMAL LOW (ref 4.0–10.5)
WBC: 3.8 K/uL — ABNORMAL LOW (ref 4.0–10.5)
nRBC: 0 % (ref 0.0–0.2)
nRBC: 0 % (ref 0.0–0.2)

## 2024-06-11 LAB — BPAM RBC
Blood Product Expiration Date: 202507292359
Blood Product Expiration Date: 202507312359
Blood Product Expiration Date: 202508072359
Blood Product Expiration Date: 202508102359
ISSUE DATE / TIME: 202507121028
ISSUE DATE / TIME: 202507121649
Unit Type and Rh: 5100
Unit Type and Rh: 5100
Unit Type and Rh: 600
Unit Type and Rh: 600

## 2024-06-11 LAB — TYPE AND SCREEN
ABO/RH(D): A NEG
Antibody Screen: POSITIVE
Donor AG Type: NEGATIVE
Unit division: 0
Unit division: 0
Unit division: 0
Unit division: 0
Unit division: 0
Unit division: 0

## 2024-06-11 LAB — PREPARE RBC (CROSSMATCH)

## 2024-06-11 MED ORDER — LIDOCAINE HCL (PF) 2 % IJ SOLN
INTRAMUSCULAR | Status: DC | PRN
  Administered 2024-06-11: 40 mg via INTRADERMAL

## 2024-06-11 MED ORDER — SODIUM CHLORIDE 0.9 % IV SOLN: INTRAVENOUS | Status: DC

## 2024-06-11 MED ORDER — PROPOFOL 1000 MG/100ML IV EMUL
INTRAVENOUS | Status: AC
  Filled 2024-06-11: qty 100

## 2024-06-11 MED ORDER — PROPOFOL 10 MG/ML IV BOLUS
INTRAVENOUS | Status: DC | PRN
  Administered 2024-06-11: 20 mg via INTRAVENOUS
  Administered 2024-06-11: 60 mg via INTRAVENOUS
  Administered 2024-06-11: 20 mg via INTRAVENOUS

## 2024-06-11 MED ORDER — LIDOCAINE HCL (PF) 2 % IJ SOLN
INTRAMUSCULAR | Status: AC
  Filled 2024-06-11: qty 5

## 2024-06-11 NOTE — Progress Notes (Signed)
 I have reviewed and concur with this student's documentation.   Carola Beal, RN 06/11/2024 2:07 PM

## 2024-06-11 NOTE — Anesthesia Preprocedure Evaluation (Addendum)
 Anesthesia Evaluation  Patient identified by MRN, date of birth, ID band Patient awake    Reviewed: Allergy & Precautions, NPO status , Patient's Chart, lab work & pertinent test results  History of Anesthesia Complications Negative for: history of anesthetic complications  Airway Mallampati: III  TM Distance: >3 FB Neck ROM: full    Dental  (+) Edentulous Upper, Edentulous Lower   Pulmonary sleep apnea , former smoker   Pulmonary exam normal        Cardiovascular hypertension, pulmonary hypertension(-) angina + CAD, + CABG and +CHF  (-) DOE + dysrhythmias Atrial Fibrillation + Valvular Problems/Murmurs   IMPRESSIONS     1. Left ventricular ejection fraction, by estimation, is 35 to 40%. The  left ventricle has moderately decreased function. The left ventricle  demonstrates global hypokinesis. Left ventricular diastolic parameters are  indeterminate.   2. Right ventricular systolic function is moderately reduced. The right  ventricular size is mildly enlarged. There is moderately elevated  pulmonary artery systolic pressure. The estimated right ventricular  systolic pressure is 55.9 mmHg.   3. Left atrial size was severely dilated.   4. Right atrial size was severely dilated.   5. The mitral valve is grossly normal. Severe mitral valve regurgitation.   6. Tricuspid valve regurgitation is severe.   7. The aortic valve was not well visualized. Aortic valve regurgitation  is trivial.     Neuro/Psych  Headaches TIA Neuromuscular disease  negative psych ROS   GI/Hepatic PUD,GERD  Medicated,,(+) Cirrhosis   Esophageal Varices      Endo/Other  diabetesHypothyroidism    Renal/GU negative Renal ROS  negative genitourinary   Musculoskeletal   Abdominal   Peds  Hematology negative hematology ROS (+)   Anesthesia Other Findings Past Medical History: No date: AC (acromioclavicular) joint bone spurs, right No date:  Anemia No date: Arthritis No date: Atrial fibrillation (HCC)     Comment:  a.) CHA2DS2-VASc = 7 (age, sex, HFrEF, HTN, TIA x 2,               T2DM). b.) rate/rhythm controlled on oral carvedilol ;               chronically anticoagulated with warfarin. No date: Cardiac murmur No date: CHF (congestive heart failure) (HCC) No date: Chronic anticoagulation     Comment:  a.) warfarin 02/28/2024: Clostridium difficile colitis No date: Cor triatriatum     Comment:  a.) s/p repair 01/2004 No date: Coronary artery disease     Comment:  a.) 3v CABG 01/28/2004. b.) R/LHC 03/29/2017: small RCA               with occluded SVG; no significant Dz. Chronically               occluded LAD with patent SVG to D1/LAD. Insignificant Dz               in LCx. LM normal. No date: Cortical cataract No date: DOE (dyspnea on exertion) No date: DOE (dyspnea on exertion) No date: GERD (gastroesophageal reflux disease) No date: HFrEF (heart failure with reduced ejection fraction) (HCC)     Comment:  a.) TTE 10/27/2014: EF 40%; glob HK; mild BAE; triv AR,               mild MR/PR, mod TR.  b.) TTE 03/15/2017: EF 30%; glob HK;              mod BAE; mod pHTN (RVSP 54.8 mmHg); mild PR,  mod MR; sev               TR.  c.) R/LHC 03/29/2017: EF 30-35%. d.)TTE 10/25/2018:               EF 35%; mod BAE; mod BVE; glob HK; triv PR, mod MR, sev               TR; RVSP 57.7 mmHg; G2DD. 2004: History of shingles No date: Hyperlipidemia No date: Hypertension No date: Hypothyroidism No date: IBS (irritable bowel syndrome) No date: Ischemic cardiomyopathy     Comment:  a.) TTE 10/27/2014: EF 40%. b.) TTE 03/15/2017: EF 30%.               c.) R/LHC 04/01/2017: EF 30-35%. d.) TTE 10/25/2018: EF               35% 06/07/2023: Lower GI bleed No date: Lumbar scoliosis No date: Lumbar spinal stenosis 12/17/2018: Melena No date: Migraines No date: Osteoporosis 02/28/2024: Pancolitis (HCC) No date: Pulmonary HTN (HCC)      Comment:  a.) TTE 03/15/2017: EF 30-35%; RVSP 54.8 mmHg. b.) R/LHC              03/29/2017: mean PA 33 mmHg, PCWP 22 mmHg, LVEDP 14 mmHg,              mean AO 79 mmHg; CO 7.89 L/min, CI 4.61 L/min/m 01/28/2004: S/P CABG x 3 No date: Sleep apnea No date: T2DM (type 2 diabetes mellitus) (HCC) 05/29/2016: TIA (transient ischemic attack) No date: Vitamin B 12 deficiency  Past Surgical History: No date: CARDIAC CATHETERIZATION No date: CATARACT EXTRACTION W/ INTRAOCULAR LENS IMPLANT; Bilateral     Comment:  Cataract Extraction with IOL 02/28/2024: COLECTOMY     Comment:  Procedure: COLECTOMY, TOTAL;  Surgeon: Lane Shope,              MD;  Location: [facility] ORS;  Service: General;; 12/19/2018: COLONOSCOPY; N/A     Comment:  Procedure: COLONOSCOPY;  Surgeon: Janalyn Keene NOVAK,               MD;  Location: [facility] ENDOSCOPY;  Service: Endoscopy;                Laterality: N/A; 06/17/2020: COLONOSCOPY WITH PROPOFOL ; N/A     Comment:  Procedure: COLONOSCOPY WITH PROPOFOL ;  Surgeon:               Janalyn Keene NOVAK, MD;  Location: [facility] ENDOSCOPY;                Service: Endoscopy;  Laterality: N/A; 03/30/2023: COLONOSCOPY WITH PROPOFOL ; N/A     Comment:  Procedure: COLONOSCOPY WITH PROPOFOL ;  Surgeon: Onita Elspeth Sharper, DO;  Location: [facility] ENDOSCOPY;  Service:               Endoscopy;  Laterality: N/A; 01/28/2004: COR TRIATRIATUM REPAIR; N/A 01/28/2004: CORONARY ARTERY BYPASS GRAFT; N/A     Comment:  3v CABG 12/19/2018: ESOPHAGOGASTRODUODENOSCOPY; N/A     Comment:  Procedure: ESOPHAGOGASTRODUODENOSCOPY (EGD);  Surgeon:               Janalyn Keene NOVAK, MD;  Location: [facility] ENDOSCOPY;                Service: Endoscopy;  Laterality: N/A; 06/17/2020: ESOPHAGOGASTRODUODENOSCOPY (EGD) WITH PROPOFOL ; N/A     Comment:  Procedure: ESOPHAGOGASTRODUODENOSCOPY (EGD) WITH  PROPOFOL ;  Surgeon: Janalyn Keene NOVAK, MD;  Location:               [facility] ENDOSCOPY;  Service:  Endoscopy;  Laterality: N/A; 03/30/2023: ESOPHAGOGASTRODUODENOSCOPY (EGD) WITH PROPOFOL ; N/A     Comment:  Procedure: ESOPHAGOGASTRODUODENOSCOPY (EGD) WITH               PROPOFOL ;  Surgeon: Onita Elspeth Sharper, DO;  Location:              [facility] ENDOSCOPY;  Service: Endoscopy;  Laterality: N/A; 02/28/2024: LAPAROTOMY; N/A     Comment:  Procedure: LAPAROTOMY, EXPLORATORY;  Surgeon: Lane Shope, MD;  Location: [facility] ORS;  Service: General;                Laterality: N/A; 03/29/2017: RIGHT/LEFT HEART CATH AND CORONARY ANGIOGRAPHY; Bilateral     Comment:  Procedure: Right/Left Heart Cath and Coronary               Angiography;  Surgeon: Vinie DELENA Jude, MD;  Location:               [facility] INVASIVE CV LAB;  Service: Cardiovascular;                Laterality: Bilateral; 04/05/2022: TOTAL KNEE ARTHROPLASTY; Right     Comment:  Procedure: TOTAL KNEE ARTHROPLASTY;  Surgeon: Kathlynn Sharper, MD;  Location: [facility] ORS;  Service: Orthopedics;               Laterality: Right; No date: TUBAL LIGATION 04/21/2017: VENTRAL HERNIA REPAIR; N/A     Comment:  Procedure: HERNIA REPAIR VENTRAL ADULT;  Surgeon: Claudene Larinda Bolder, MD;  Location: [facility] ORS;  Service:               General;  Laterality: N/A;  BMI    Body Mass Index: 26.08 kg/m      Reproductive/Obstetrics negative OB ROS                              Anesthesia Physical Anesthesia Plan  ASA: 4  Anesthesia Plan: General   Post-op Pain Management: Minimal or no pain anticipated   Induction: Intravenous  PONV Risk Score and Plan: 2 and Propofol  infusion and TIVA  Airway Management Planned: Natural Airway and Nasal Cannula  Additional Equipment:   Intra-op Plan:   Post-operative Plan:   Informed Consent: I have reviewed the patients History and Physical, chart, labs and discussed the procedure including the risks, benefits and alternatives for the proposed anesthesia with  the patient or authorized representative who has indicated his/her understanding and acceptance.     Dental Advisory Given  Plan Discussed with: Anesthesiologist, CRNA and Surgeon  Anesthesia Plan Comments: (Patient consented for risks of anesthesia including but not limited to:  - adverse reactions to medications - risk of airway placement if required - damage to eyes, teeth, lips or other oral mucosa - nerve damage due to positioning  - sore throat or hoarseness - Damage to heart, brain, nerves, lungs, other parts of body or loss of life  Patient voiced understanding and assent.)         Anesthesia Quick Evaluation

## 2024-06-11 NOTE — TOC CM/SW Note (Signed)
 Transition of Care [facility]) - Inpatient Brief Assessment   Patient Details  Name: Tammy Arias MRN: [id number] Date of Birth: [birth year]  [facility] of [facility]) CM/SW Contact:    Corean ONEIDA Haddock, RN Phone Number: 06/11/2024, 4:23 PM   Clinical Narrative:   Transition of Care [facility]) Screening Note   Patient Details  Name: Tammy Arias Date of Birth: 11/15/1956   Transition of Care [facility]) CM/SW Contact:    Corean ONEIDA Haddock, RN Phone Number: 06/11/2024, 4:23 PM    Transition of Care Department [facility]) has reviewed patient and no TOC needs have been identified at this time. . If new patient transition needs arise, please place a TOC consult.    Transition of Care Asessment: Insurance and Status: Insurance coverage has been reviewed Patient has primary care physician: Yes     Prior/Current Home Services: No current home services Social Drivers of Health Review: SDOH reviewed no interventions necessary Readmission risk has been reviewed: Yes Transition of care needs: no transition of care needs at this time

## 2024-06-11 NOTE — Plan of Care (Signed)

## 2024-06-11 NOTE — Op Note (Addendum)
 [facility] Gastroenterology Patient Name: Tammy Arias Procedure Date: 06/11/2024 12:58 PM MRN: [id number] Account #: [id number] Date of Birth: [birth year] Admit Type: Inpatient Age: 68 Room: 4 Gender: Female Note Status: Finalized Instrument Name: Upper Endoscope [id number] Procedure:             Upper GI endoscopy Indications:           Hematochezia Providers:             [name] MD, MD Referring MD:          Lynwood FALCON. Valora, MD (Referring MD) Medicines:             Propofol  per Anesthesia Complications:         No immediate complications. Procedure:             Pre-Anesthesia Assessment:                        - Prior to the procedure, a History and Physical was                         performed, and patient medications and allergies were                         reviewed. The patient's tolerance of previous                         anesthesia was also reviewed. The risks and benefits                         of the procedure and the sedation options and risks                         were discussed with the patient. All questions were                         answered, and informed consent was obtained. Prior                         Anticoagulants: The patient has taken no anticoagulant                         or antiplatelet agents. ASA Grade Assessment: II - A                         patient with mild systemic disease. After reviewing                         the risks and benefits, the patient was deemed in                         satisfactory condition to undergo the procedure.                        After obtaining informed consent, the endoscope was                         passed under direct vision. Throughout the procedure,  the patient's blood pressure, pulse, and oxygen                         saturations were monitored continuously. The Endoscope                         was introduced through the mouth, and advanced to the                          second part of duodenum. The upper GI endoscopy was                         accomplished without difficulty. The patient tolerated                         the procedure well. Findings:      Grade II varices were found in the lower third of the esophagus.      Moderate portal hypertensive gastropathy was found in the gastric body.      The examined duodenum was normal. Impression:            - Grade II esophageal varices.                        - Portal hypertensive gastropathy.                        - Normal examined duodenum.                        - No specimens collected. Recommendation:        - Return patient to hospital ward for ongoing care.                        - Resume regular diet.                        - Continue present medications. Procedure Code(s):     --- Professional ---                        [phone], Esophagogastroduodenoscopy, flexible,                         transoral; diagnostic, including collection of                         specimen(s) by brushing or washing, when performed                         (separate procedure) Diagnosis Code(s):     --- Professional ---                        K92.1, Melena (includes Hematochezia)                        I85.00, Esophageal varices without bleeding CPT copyright 2022 American Medical Association. All rights reserved. The codes documented in this report are preliminary and upon coder review may  be revised to meet current compliance requirements. [name] MD, MD 06/11/2024 1:12:27 PM This report has been signed  electronically. Number of Addenda: 0 Note Initiated On: 06/11/2024 12:58 PM Estimated Blood Loss:  Estimated blood loss: none.      [facility]

## 2024-06-11 NOTE — Progress Notes (Signed)
 The patient had no sign of any active GI bleeding.  The patient's varices that was seen on the previous EGD in the hypertensive portal gastropathy seen on this EGD were likely contributing to the patient's bleeding although no active bleeding was seen.  Nothing further to do from a GI point of view.  Have the patient follow-up with her primary GI doctor Dr. Onita as an outpatient.  I will sign off.  Please call if any further GI concerns or questions.  We would like to thank you for the opportunity to participate in the care of Tammy Arias.

## 2024-06-11 NOTE — Progress Notes (Signed)
 Progress Note   Patient: Tammy Arias FMW:[id number] DOB: 1956/10/26 DOA: 06/07/2024     4 DOS: the patient was seen and examined on 06/11/2024    Brief hospital course: From HPI YESIKA RISPOLI is a 68 y.o. female with medical history significant of liver cirrhosis with esophageal varices, AVM, GIB, HTN, HLD, DM, CAD with CABG, sCHF with EF35-40%, TIA, hypothyroidism, gout, GERD, OSA on CPAP, anemia, A-fib on plavix and ASA (s/p of Watchman procedure), pulmonary hypertension, IBS, Cor Atrarium (s/p of repair), lumbar stenosis, pancolitis and C. difficile colitis (s/p of total colectomy and ileostomy April 2025), who presents with dark bloody loose stool.  Patient used to be on Eliquis  which was switched to Plavix and aspirin  on 05/30/2024 by her cardiologist.     Assessment and Plan:   Acute GI bleeding, iron  deficiency anemia and acute blood loss anemia:  Hemoglobin on 06/08/2024 noted to be 6.8 Status post 2 units of packed RBC  Continue to hold Plavix and aspirin  Plan of care discussed with gastroenterologist Continue PPI therapy Continue octreotide  till 8 PM today as recommended by GI Case discussed with Dr. Jinny post EGD today Continue to avoid NSAIDs and SQ heparin  We will monitor hemoglobin for another 24 hours to ensure stabilization before discharge   Cirrhosis and Portal hypertension  on CT(HCC): Mental status normal - Monitor closely   Pancytopenia Thrombocytopenia (HCC):  Patient has chronic thrombocytopenia however noted to also have leukopenia and anemia Follow-up on anemia panel Patient will benefit from hematology follow-up at discharge   CAD with hx of CABG: No chest pain -Hold aspirin  and Plavix - Continue Crestor    Hypothyroidism - Continue Synthroid    Chronic systolic CHF (congestive heart failure) (HCC) - LVEF 35 to 40%: 2D echo on 03/07/2024 showed EF 35-40%.  No SOB or leg edema.  CHF seem to be compensated.  BNP 125. -Temporarily hold spironolactone  and  torsemide  in the setting of relative hypotension   HTN (hypertension) -IV hydralazine  as needed   Hyperlipidemia -Crestor    Atrial fibrillation, chronic (HCC): Heart rate 104- > 99 -Hold Plavix and aspirin  - Continue as needed metoprolol  2.5 mg every 2 hours for heart rate higher than 125   Gout - Continue allopurinol    Type 2 diabetes mellitus with chronic kidney disease, without long-term current use of insulin  (HCC): Recent A1c 5.9, well-controlled.  Patient takes metformin  at home Continue current insulin  therapy   GERD without esophagitis -On Protonix  IV as above   Ileostomy in place [facility]): No acute issues.   OSA - on CPAP   DVT ppx: SCD   Code Status: Full code      Disposition Plan:  Anticipate discharge back to previous environment   Consults called: GI   Subjective:  Patient seen and examined at bedside this morning Hemoglobin down trended to 8.3 today Patient denies any obvious external bleeding Patient underwent EGD today Denies worsening abdominal pain chest pain cough  Physical Exam: General: Not in acute distress Heme: No neck lymph node enlargement. Cardiac: S1/S2, irregularly irregular rhythm, no gallops or rubs. Respiratory: No rales, wheezing, rhonchi or rubs. GI:  Has ileostomy bag in place, stool now appears greenish GU: No hematuria Ext: No pitting leg edema bilaterally. 1+DP/PT pulse bilaterally. Musculoskeletal: No joint deformities, No joint redness or warmth, no limitation of ROM in spin. Skin: No rashes.  Neuro: Alert, oriented X3, cranial nerves II-XII grossly intact, moves all extremities normally.  Psych: Patient is not p   Family Communication:  None present at bedside   Disposition: Hopefully home when medically stable   Time spent: 41  minutes     Data Reviewed:    Latest Ref Rng & Units 06/11/2024    3:08 AM 06/10/2024    5:11 AM 06/09/2024    5:45 AM  CBC  WBC 4.0 - 10.5 K/uL 2.9  2.5  3.5   Hemoglobin 12.0 - 15.0  g/dL 8.3  9.0  9.4   Hematocrit 36.0 - 46.0 % 25.5  27.2  28.0   Platelets 150 - 400 K/uL 67  61  69        Latest Ref Rng & Units 06/11/2024    3:08 AM 06/10/2024    5:11 AM 06/09/2024    5:45 AM  BMP  Glucose 70 - 99 mg/dL 893  871  873   BUN 8 - 23 mg/dL 13  13  15    Creatinine 0.44 - 1.00 mg/dL 9.20  9.17  9.16   Sodium 135 - 145 mmol/L 135  137  136   Potassium 3.5 - 5.1 mmol/L 5.1  4.6  4.3   Chloride 98 - 111 mmol/L 109  110  107   CO2 22 - 32 mmol/L 23  21  23    Calcium  8.9 - 10.3 mg/dL 8.1  8.0  7.7     Vitals:   06/11/24 0240 06/11/24 0735 06/11/24 1210 06/11/24 1311  BP: 103/68 (!) 118/56 (!) 106/53   Pulse: 73 65 (!) 58 74  Resp: 16 17 16    Temp: 97.6 F (36.4 C) 97.8 F (36.6 C) (!) 96.9 F (36.1 C) (!) 96.5 F (35.8 C)  TempSrc: Oral Oral Temporal Tympanic  SpO2: 100% 100% 100%   Weight:      Height:         Author: Drue ONEIDA Potter, MD 06/11/2024 1:24 PM  For on call review [email or web address].

## 2024-06-11 NOTE — Anesthesia Postprocedure Evaluation (Signed)
 Anesthesia Post Note  Patient: Tammy Arias  Procedure(s) Performed: EGD (ESOPHAGOGASTRODUODENOSCOPY)  Patient location during evaluation: Endoscopy Anesthesia Type: General Level of consciousness: awake and alert Pain management: pain level controlled Vital Signs Assessment: post-procedure vital signs reviewed and stable Respiratory status: spontaneous breathing, nonlabored ventilation, respiratory function stable and patient connected to nasal cannula oxygen Cardiovascular status: blood pressure returned to baseline and stable Postop Assessment: no apparent nausea or vomiting Anesthetic complications: no   No notable events documented.   Last Vitals:  Vitals:   06/11/24 1210 06/11/24 1311  BP: (!) 106/53   Pulse: (!) 58 74  Resp: 16   Temp: (!) 36.1 C (!) 35.8 C  SpO2: 100%     Last Pain:  Vitals:   06/11/24 1311  TempSrc: Tympanic  PainSc:                  [name]

## 2024-06-11 NOTE — Progress Notes (Signed)
 Mobility Specialist - Progress Note   06/11/24 1524  Mobility  Activity Ambulated independently in hallway;Ambulated independently in room  Level of Assistance Independent  Assistive Device None  Distance Ambulated (ft) 320 ft  Activity Response Tolerated well  Mobility visit 1 Mobility  Mobility Specialist Start Time (ACUTE ONLY) 1513  Mobility Specialist Stop Time (ACUTE ONLY) 1521  Mobility Specialist Time Calculation (min) (ACUTE ONLY) 8 min   Pt supine upon entry, utilizing RA. Pt motivated and agreeable to OOB amb this date. Pt completed bed mob, STS, dons gown and amb two laps around the NS indep. Pt expressed feeling well and ready to go. Pt returned to the room, left in fowler position with needs within reach.  America Silvan Mobility Specialist 06/11/24 3:32 PM

## 2024-06-11 NOTE — Transfer of Care (Signed)
 Immediate Anesthesia Transfer of Care Note  Patient: Tammy Arias  Procedure(s) Performed: EGD (ESOPHAGOGASTRODUODENOSCOPY)  Patient Location: PACU and Endoscopy Unit  Anesthesia Type:MAC  Level of Consciousness: drowsy  Airway & Oxygen Therapy: Patient Spontanous Breathing and Patient connected to nasal cannula oxygen  Post-op Assessment: Report given to RN and Post -op Vital signs reviewed and stable  Post vital signs: Reviewed and stable  Last Vitals:  Vitals Value Taken Time  BP 96/66   Temp    Pulse 76   Resp 16   SpO2 97     Last Pain:  Vitals:   06/11/24 1210  TempSrc: Temporal  PainSc: 0-No pain         Complications: No notable events documented.

## 2024-06-11 NOTE — Progress Notes (Signed)
 Patient called this RN into the room with concerns for increased GI bleeding. Upon entering the room, the patient presents her underwear with a moderate amount of bright red blood. Patient denies any coffee ground emesis or blood in her colostomy. Primary provider notified via chat, pending further instructions.

## 2024-06-12 ENCOUNTER — Encounter: Payer: Self-pay | Admitting: Gastroenterology

## 2024-06-12 DIAGNOSIS — K922 Gastrointestinal hemorrhage, unspecified: Secondary | ICD-10-CM | POA: Diagnosis not present

## 2024-06-12 LAB — CBC WITH DIFFERENTIAL/PLATELET
Abs Immature Granulocytes: 0.02 K/uL (ref 0.00–0.07)
Basophils Absolute: 0 K/uL (ref 0.0–0.1)
Basophils Relative: 1 %
Eosinophils Absolute: 0.4 K/uL (ref 0.0–0.5)
Eosinophils Relative: 14 %
HCT: 26.1 % — ABNORMAL LOW (ref 36.0–46.0)
Hemoglobin: 8.4 g/dL — ABNORMAL LOW (ref 12.0–15.0)
Immature Granulocytes: 1 %
Lymphocytes Relative: 8 %
Lymphs Abs: 0.2 K/uL — ABNORMAL LOW (ref 0.7–4.0)
MCH: 32.9 pg (ref 26.0–34.0)
MCHC: 32.2 g/dL (ref 30.0–36.0)
MCV: 102.4 fL — ABNORMAL HIGH (ref 80.0–100.0)
Monocytes Absolute: 0.3 K/uL (ref 0.1–1.0)
Monocytes Relative: 12 %
Neutro Abs: 1.8 K/uL (ref 1.7–7.7)
Neutrophils Relative %: 64 %
Platelets: 67 K/uL — ABNORMAL LOW (ref 150–400)
RBC: 2.55 MIL/uL — ABNORMAL LOW (ref 3.87–5.11)
RDW: 16.5 % — ABNORMAL HIGH (ref 11.5–15.5)
WBC: 2.8 K/uL — ABNORMAL LOW (ref 4.0–10.5)
nRBC: 0 % (ref 0.0–0.2)

## 2024-06-12 LAB — BASIC METABOLIC PANEL WITH GFR
Anion gap: 4 — ABNORMAL LOW (ref 5–15)
BUN: 12 mg/dL (ref 8–23)
CO2: 21 mmol/L — ABNORMAL LOW (ref 22–32)
Calcium: 8.1 mg/dL — ABNORMAL LOW (ref 8.9–10.3)
Chloride: 111 mmol/L (ref 98–111)
Creatinine, Ser: 0.82 mg/dL (ref 0.44–1.00)
GFR, Estimated: >60 mL/min (ref >60)
Glucose, Bld: 152 mg/dL — ABNORMAL HIGH (ref 70–99)
Potassium: 4.6 mmol/L (ref 3.5–5.1)
Sodium: 136 mmol/L (ref 135–145)

## 2024-06-12 LAB — GLUCOSE, CAPILLARY: Glucose-Capillary: 117 mg/dL — ABNORMAL HIGH (ref 70–99)

## 2024-06-12 NOTE — Progress Notes (Signed)
 Discharge instructions reviewed with the patient. She was educated on medication administration, s/s of bleeding requiring a call to the doctor or trip to the ED, and on the importance of making follow up appointments with her GI. All of her questions and concerns were addressed to her satisfaction. She verbalized understanding of all education. Patient pending transport to d/c lounge at this time.

## 2024-06-12 NOTE — Progress Notes (Signed)
 Mobility Specialist - Progress Note   06/12/24 1216  Mobility  Activity Ambulated independently in room  Level of Assistance Independent  Assistive Device None  Distance Ambulated (ft) 40 ft  Activity Response Tolerated well  Mobility visit 1 Mobility  Mobility Specialist Start Time (ACUTE ONLY) 1111  Mobility Specialist Stop Time (ACUTE ONLY) 1118  Mobility Specialist Time Calculation (min) (ACUTE ONLY) 7 min   America Silvan Mobility Specialist 06/12/24 12:17 PM

## 2024-06-12 NOTE — Discharge Summary (Signed)
 Physician Discharge Summary   Patient: Tammy Arias MRN: [id number] DOB: [birth year]  Admit date:     06/07/2024  Discharge date: 06/12/24  Discharge Physician: Drue ONEIDA Potter   PCP: Valora Agent, MD   Recommendations at discharge:  Follow-up with cardiology as well as GI  Discharge Diagnoses:  Acute GI bleeding, iron  deficiency anemia and acute blood loss anemia:  Cirrhosis and Portal hypertension  on CT(HCC):  Pancytopenia Thrombocytopenia (HCC): CAD with hx of CABG:  Hypothyroidism Chronic systolic CHF (congestive heart failure) HTN (hypertension) Hyperlipidemia Atrial fibrillation, chronic (HCC):  Gout Type 2 diabetes mellitus with chronic kidney disease, without long-term current use of insulin  (HCC): GERD without esophagitis Ileostomy in place [facility]):  OSA   Hospital Course: Tammy Arias is a 68 y.o. female with medical history significant of liver cirrhosis with esophageal varices, AVM, GIB, HTN, HLD, DM, CAD with CABG, sCHF with EF35-40%, TIA, hypothyroidism, gout, GERD, OSA on CPAP, anemia, A-fib on plavix and ASA (s/p of Watchman procedure), pulmonary hypertension, IBS, Cor Atrarium (s/p of repair), lumbar stenosis, pancolitis and C. difficile colitis (s/p of total colectomy and ileostomy April 2025), who presents with dark bloody loose stool.  Patient used to be on Eliquis  which was switched to Plavix and aspirin  on 05/30/2024 by her cardiologist.  Patient's bleeding resolved underwent EGD that did not show any acute bleeding.  Patient was cleared by GI to resume Plavix as well as aspirin  therapy and patient was counseled about the risks of bleeding with this management.  Patient plans to follow-up with her cardiologist concerning her Plavix as well as aspirin .  Consultants: GI Procedures performed: None Disposition: Home Diet recommendation:  Cardiac diet DISCHARGE MEDICATION: Allergies as of 06/12/2024       Reactions   Vioxx [rofecoxib] Swelling         Medication List     STOP taking these medications    apixaban  2.5 MG Tabs tablet Commonly known as: ELIQUIS    ascorbic acid  500 MG tablet Commonly known as: VITAMIN C    colchicine  0.6 MG tablet   iron  polysaccharides 150 MG capsule Commonly known as: NIFEREX   midodrine  10 MG tablet Commonly known as: PROAMATINE    potassium chloride  10 MEQ tablet Commonly known as: KLOR-CON    sodium bicarbonate  650 MG tablet       TAKE these medications    albuterol  108 (90 Base) MCG/ACT inhaler Commonly known as: VENTOLIN  HFA Inhale 2 puffs into the lungs every 6 (six) hours as needed for wheezing or shortness of breath.   allopurinol  300 MG tablet Commonly known as: ZYLOPRIM  Take 300 mg by mouth daily.   aspirin  EC 81 MG tablet Take 81 mg by mouth. Take 1 tablet (81 mg total) by mouth once daily   clopidogrel 75 MG tablet Commonly known as: PLAVIX Take 75 mg by mouth. Take 1 tablet (75 mg total) by mouth once daily for 182 days   empagliflozin  10 MG Tabs tablet Commonly known as: JARDIANCE  Take 1 tablet by mouth daily.   latanoprost  0.005 % ophthalmic solution Commonly known as: XALATAN  Place 1 drop into both eyes at bedtime.   levothyroxine  50 MCG tablet Commonly known as: SYNTHROID  Take 50 mcg by mouth daily before breakfast.   loperamide  2 MG tablet Commonly known as: Imodium  A-D Take 2 tablets (4 mg total) by mouth 4 (four) times daily as needed for diarrhea or loose stools.   magnesium  oxide 400 MG tablet Commonly known as: MAG-OX Take 400 mg by  mouth. Take 1 tablet (400 mg total) by mouth once daily   metFORMIN  1000 MG tablet Commonly known as: GLUCOPHAGE  Take 1,000 mg by mouth 2 (two) times daily with a meal.   pantoprazole  40 MG tablet Commonly known as: PROTONIX  Take 40 mg by mouth daily.   rosuvastatin  5 MG tablet Commonly known as: CRESTOR  Take 5 mg by mouth daily.   spironolactone  25 MG tablet Commonly known as: ALDACTONE  Take 0.5 tablets  (12.5 mg total) by mouth daily.   torsemide  20 MG tablet Commonly known as: DEMADEX  Take 1 tablet (20 mg total) by mouth daily as needed (for weight gain or swelling).        Discharge Exam: Filed Weights   06/09/24 0500 06/10/24 0442 06/12/24 0500  Weight: 59.9 kg 62.6 kg 62.4 kg   General: Not in acute distress Heme: No neck lymph node enlargement. Cardiac: S1/S2, irregularly irregular rhythm, no gallops or rubs. Respiratory: No rales, wheezing, rhonchi or rubs. GI:  Has ileostomy bag in place, stool now appears greenish GU: No hematuria Ext: No pitting leg edema bilaterally. 1+DP/PT pulse bilaterally. Musculoskeletal: No joint deformities, No joint redness or warmth, no limitation of ROM in spin. Skin: No rashes.  Neuro: Alert, oriented X3, cranial nerves II-XII grossly intact, moves all extremities normally.   Condition at discharge: good  The results of significant diagnostics from this hospitalization (including imaging, microbiology, ancillary and laboratory) are listed below for reference.   Imaging Studies: No results found.  Microbiology: Results for orders placed or performed during the hospital encounter of 06/07/24  Gastrointestinal Panel by PCR , Stool     Status: None   Collection Time: 06/08/24  3:37 PM   Specimen: Stool  Result Value Ref Range Status   Campylobacter species NOT DETECTED NOT DETECTED Final   Plesimonas shigelloides NOT DETECTED NOT DETECTED Final   Salmonella species NOT DETECTED NOT DETECTED Final   Yersinia enterocolitica NOT DETECTED NOT DETECTED Final   Vibrio species NOT DETECTED NOT DETECTED Final   Vibrio cholerae NOT DETECTED NOT DETECTED Final   Enteroaggregative E coli (EAEC) NOT DETECTED NOT DETECTED Final   Enteropathogenic E coli (EPEC) NOT DETECTED NOT DETECTED Final   Enterotoxigenic E coli (ETEC) NOT DETECTED NOT DETECTED Final   Shiga like toxin producing E coli (STEC) NOT DETECTED NOT DETECTED Final    Shigella/Enteroinvasive E coli (EIEC) NOT DETECTED NOT DETECTED Final   Cryptosporidium NOT DETECTED NOT DETECTED Final   Cyclospora cayetanensis NOT DETECTED NOT DETECTED Final   Entamoeba histolytica NOT DETECTED NOT DETECTED Final   Giardia lamblia NOT DETECTED NOT DETECTED Final   Adenovirus F40/41 NOT DETECTED NOT DETECTED Final   Astrovirus NOT DETECTED NOT DETECTED Final   Norovirus GI/GII NOT DETECTED NOT DETECTED Final   Rotavirus A NOT DETECTED NOT DETECTED Final   Sapovirus (I, II, IV, and V) NOT DETECTED NOT DETECTED Final    Comment: Performed at [facility], [address] [place], [place] [address]    Labs: CBC: Recent Labs  Lab 06/09/24 0545 06/10/24 0511 06/11/24 0308 06/11/24 1813 06/12/24 0333  WBC 3.5* 2.5* 2.9* 3.8* 2.8*  NEUTROABS 2.2 1.6* 1.8 2.7 1.8  HGB 9.4* 9.0* 8.3* 9.4* 8.4*  HCT 28.0* 27.2* 25.5* 29.3* 26.1*  MCV 100.4* 100.7* 101.6* 104.6* 102.4*  PLT 69* 61* 67* 68* 67*   Basic Metabolic Panel: Recent Labs  Lab 06/08/24 0537 06/09/24 0545 06/10/24 0511 06/11/24 0308 06/12/24 0333  NA 141 136 137 135 136  K 4.7 4.3 4.6 5.1 4.6  CL 111 107 110 109 111  CO2 23 23 21* 23 21*  GLUCOSE 150* 126* 128* 106* 152*  BUN 21 15 13 13 12   CREATININE 1.09* 0.83 0.82 0.79 0.82  CALCIUM  8.1* 7.7* 8.0* 8.1* 8.1*   Liver Function Tests: Recent Labs  Lab 06/07/24 1929  AST 40  ALT 22  ALKPHOS 124  BILITOT 0.9  PROT 7.3  ALBUMIN  4.1   CBG: Recent Labs  Lab 06/11/24 0817 06/11/24 1236 06/11/24 1639 06/11/24 2136 06/12/24 0752  GLUCAP 92 99 123* 149* 117*    Discharge time spent:  37 minutes.  Signed: Drue ONEIDA Potter, MD [facility] 06/12/2024

## 2024-06-21 ENCOUNTER — Ambulatory Visit
Admission: RE | Admit: 2024-06-21 | Discharge: 2024-06-21 | Disposition: A | Discharge status: Home / Self Care | Source: Ambulatory Visit | Attending: Family Medicine | Admitting: Family Medicine

## 2024-06-21 DIAGNOSIS — Z1231 Encounter for screening mammogram for malignant neoplasm of breast: Secondary | ICD-10-CM | POA: Diagnosis present

## 2024-06-28 ENCOUNTER — Inpatient Hospital Stay: Attending: Oncology

## 2024-06-28 DIAGNOSIS — E538 Deficiency of other specified B group vitamins: Secondary | ICD-10-CM | POA: Insufficient documentation

## 2024-06-28 DIAGNOSIS — D708 Other neutropenia: Secondary | ICD-10-CM

## 2024-06-28 DIAGNOSIS — D509 Iron deficiency anemia, unspecified: Secondary | ICD-10-CM | POA: Insufficient documentation

## 2024-06-28 DIAGNOSIS — D500 Iron deficiency anemia secondary to blood loss (chronic): Secondary | ICD-10-CM

## 2024-06-28 LAB — CBC WITH DIFFERENTIAL (CANCER CENTER ONLY)
Abs Immature Granulocytes: 0.01 K/uL (ref 0.00–0.07)
Basophils Absolute: 0 K/uL (ref 0.0–0.1)
Basophils Relative: 0 %
Eosinophils Absolute: 0.2 K/uL (ref 0.0–0.5)
Eosinophils Relative: 8 %
HCT: 24.4 % — ABNORMAL LOW (ref 36.0–46.0)
Hemoglobin: 7.6 g/dL — ABNORMAL LOW (ref 12.0–15.0)
Immature Granulocytes: 0 %
Lymphocytes Relative: 8 %
Lymphs Abs: 0.2 K/uL — ABNORMAL LOW (ref 0.7–4.0)
MCH: 33.6 pg (ref 26.0–34.0)
MCHC: 31.1 g/dL (ref 30.0–36.0)
MCV: 108 fL — ABNORMAL HIGH (ref 80.0–100.0)
Monocytes Absolute: 0.2 K/uL (ref 0.1–1.0)
Monocytes Relative: 10 %
Neutro Abs: 1.7 K/uL (ref 1.7–7.7)
Neutrophils Relative %: 74 %
Platelet Count: 106 K/uL — ABNORMAL LOW (ref 150–400)
RBC: 2.26 MIL/uL — ABNORMAL LOW (ref 3.87–5.11)
RDW: 18.2 % — ABNORMAL HIGH (ref 11.5–15.5)
WBC Count: 2.3 K/uL — ABNORMAL LOW (ref 4.0–10.5)
nRBC: 0 % (ref 0.0–0.2)

## 2024-06-28 LAB — IRON AND TIBC
Iron: 71 ug/dL (ref 28–170)
Saturation Ratios: 16 % (ref 10.4–31.8)
TIBC: 434 ug/dL (ref 250–450)
UIBC: 363 ug/dL

## 2024-06-28 LAB — FERRITIN: Ferritin: 59 ng/mL (ref 11–307)

## 2024-06-28 LAB — VITAMIN B12: Vitamin B-12: 918 pg/mL — ABNORMAL HIGH (ref 180–914)

## 2024-06-28 LAB — FOLATE: Folate: 19.4 ng/mL (ref >5.9)

## 2024-07-02 ENCOUNTER — Telehealth: Payer: Self-pay | Admitting: *Deleted

## 2024-07-02 DIAGNOSIS — D500 Iron deficiency anemia secondary to blood loss (chronic): Secondary | ICD-10-CM

## 2024-07-02 NOTE — Telephone Encounter (Signed)
 Patient called and wants to have someone to call her back with the results of the labs of 06/28/2024 and let her know what it means

## 2024-07-04 NOTE — Telephone Encounter (Signed)
 Per Dr. Melanee Her hb at baseline is around 10. She was admitted for GI bleeding and presently hb low around 7. Iron  studies are not interpretable since she received blood in the hospital. She should get IV iron  at this point. Low platelets. Low wbc, high mcv all from cirrhosis.  c ferritin and iron  studies to be checked in 2 months.

## 2024-07-04 NOTE — Telephone Encounter (Signed)
 Her hb at baseline is around 10. She was admitted for GI bleeding and presently hb low around 7. Iron  studies are not interpretable since she received blood in the hospital. She should get IV iron  at this point.   Low platelets. Low wbc, high mcv all from cirrhosis  Cbc ferritin and iron  studies to be checked in 2 months

## 2024-07-09 NOTE — Telephone Encounter (Signed)
 Patient notified of MD recommendation of IV iron  and agrees.  Dr. Melanee, she is scheduled for endoscopy on 07/18/24.  How and what iron  should be scheduled, please?

## 2024-07-09 NOTE — Telephone Encounter (Signed)
 Megan- please look into scheduling iv iron  for her

## 2024-07-16 ENCOUNTER — Ambulatory Visit (HOSPITAL_COMMUNITY)
Admission: RE | Admit: 2024-07-16 | Discharge: 2024-07-16 | Disposition: A | Discharge status: Home / Self Care | Source: Ambulatory Visit | Attending: Nurse Practitioner | Admitting: Nurse Practitioner

## 2024-07-16 ENCOUNTER — Other Ambulatory Visit (HOSPITAL_COMMUNITY): Payer: Self-pay | Admitting: Nurse Practitioner

## 2024-07-16 DIAGNOSIS — K9410 Enterostomy complication, unspecified: Secondary | ICD-10-CM

## 2024-07-16 DIAGNOSIS — L24B3 Irritant contact dermatitis related to fecal or urinary stoma or fistula: Secondary | ICD-10-CM | POA: Diagnosis not present

## 2024-07-16 DIAGNOSIS — K435 Parastomal hernia without obstruction or  gangrene: Secondary | ICD-10-CM

## 2024-07-16 DIAGNOSIS — Z432 Encounter for attention to ileostomy: Secondary | ICD-10-CM

## 2024-07-16 DIAGNOSIS — K4020 Bilateral inguinal hernia, without obstruction or gangrene, not specified as recurrent: Secondary | ICD-10-CM

## 2024-07-16 NOTE — Progress Notes (Signed)
  Ostomy Clinic   Reason for visit:  RLQ ileostomy, excessive bleeding noted, parastomal hernia to abdomen, impacting pouch seal. HPI:  Pancolitis with laparotomy and  end ileostomy Past Medical History:  Diagnosis Date   AC (acromioclavicular) joint bone spurs, right    Anemia    Arthritis    Atrial fibrillation (HCC)    a.) CHA2DS2-VASc = 7 (age, sex, HFrEF, HTN, TIA x 2, T2DM). b.) rate/rhythm controlled on oral carvedilol ; chronically anticoagulated with warfarin.   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 b.) R/LHC 03/29/2017: small RCA with occluded SVG; no significant Dz. Chronically occluded LAD with patent SVG to D1/LAD. Insignificant Dz in LCx. LM normal.   Cortical cataract    DOE (dyspnea on exertion)    DOE (dyspnea on exertion)    GERD (gastroesophageal reflux disease)    HFrEF (heart failure with reduced ejection fraction) (HCC)    a.) TTE 10/27/2014: EF 40%; glob HK; mild BAE; triv AR, mild MR/PR, mod TR.  b.) TTE 03/15/2017: EF 30%; glob HK; mod BAE; mod pHTN (RVSP 54.8 mmHg); mild PR, mod MR; sev TR.  c.) R/LHC 03/29/2017: EF 30-35%. d.)TTE 10/25/2018: EF 35%; mod BAE; mod BVE; glob HK; triv PR, mod MR, sev TR; RVSP 57.7 mmHg; G2DD.   History of shingles 2004   Hyperlipidemia    Hypertension    Hypothyroidism    IBS (irritable bowel syndrome)    Ischemic cardiomyopathy    a.) TTE 10/27/2014: EF 40%. b.) TTE 03/15/2017: EF 30%. c.) R/LHC 04/01/2017: EF 30-35%. d.) TTE 10/25/2018: EF 35%   Lower GI bleed 06/07/2023   Lumbar scoliosis    Lumbar spinal stenosis    Melena 12/17/2018   Migraines    Osteoporosis    Pancolitis (HCC) 02/28/2024   Pulmonary HTN (HCC)    a.) TTE 03/15/2017: EF 30-35%; RVSP 54.8 mmHg. b.) R/LHC 03/29/2017: mean PA 33 mmHg, PCWP 22  mmHg, LVEDP 14 mmHg, mean AO 79 mmHg; CO 7.89 L/min, CI 4.61 L/min/m   S/P CABG x 3 01/28/2004   Sleep apnea    T2DM (type 2 diabetes mellitus) (HCC)    TIA (transient ischemic attack) 05/29/2016   Vitamin B 12 deficiency    Family History  Problem Relation Age of Onset   Valvular heart disease Mother    Diabetes Father    Cancer Sister        lung   Cancer Sister        cervical   Breast cancer Cousin    Allergies  Allergen Reactions   Vioxx [Rofecoxib] Swelling   No current facility-administered medications for this encounter.   No current outpatient medications on file.   Facility-Administered Medications Ordered in Other Encounters  Medication Dose Route Frequency Provider Last Rate Last Admin   albuterol  (PROVENTIL ) (2.5 MG/3ML) 0.083% nebulizer solution 2.5 mg  2.5 mg Inhalation Q6H PRN Laurita Manor T, MD       allopurinol  (ZYLOPRIM ) tablet 300 mg  300 mg Oral Daily Laurita Manor T, MD   300 mg at 07/22/24 9081   dextrose  50 % solution 50 mL  50 mL Intravenous Once Ponnala, Shruthi, MD       fluticasone  (FLONASE ) 50 MCG/ACT nasal spray 2 spray  2  spray Each Nare Daily Laurita Manor T, MD       hydrALAZINE  (APRESOLINE ) injection 5 mg  5 mg Intravenous Q6H PRN Laurita Manor T, MD       levothyroxine  (SYNTHROID ) tablet 50 mcg  50 mcg Oral Q0600 Laurita Manor T, MD   50 mcg at 07/22/24 9473   ondansetron  (ZOFRAN ) tablet 4 mg  4 mg Oral Q6H PRN Laurita Manor DASEN, MD       Or   ondansetron  (ZOFRAN ) injection 4 mg  4 mg Intravenous Q6H PRN Laurita Manor T, MD       pantoprazole  (PROTONIX ) EC tablet 40 mg  40 mg Oral Daily Zhang, Ping T, MD   40 mg at 07/22/24 9081   rosuvastatin  (CRESTOR ) tablet 5 mg  5 mg Oral QPM Laurita Manor T, MD   5 mg at 07/21/24 1714   ROS  Review of Systems  Constitutional:  Positive for fatigue.  Respiratory: Negative.    Cardiovascular: Negative.   Gastrointestinal:  Positive for blood in stool.       Parastomal hernia  Hematological:  Bruises/bleeds easily  (Holding Plavix).  Psychiatric/Behavioral: Negative.    All other systems reviewed and are negative.  Vital signs:  BP 118/79 (BP Location: Right Arm)   Pulse 87   Temp 97.6 F (36.4 C) (Oral)   Resp 18   SpO2 98%  Exam:  Physical Exam Vitals reviewed.  HENT:     Mouth/Throat:     Mouth: Mucous membranes are moist.  Cardiovascular:     Rate and Rhythm: Normal rate.  Pulmonary:     Effort: Pulmonary effort is normal.  Abdominal:     Palpations: Abdomen is soft.     Hernia: A hernia is present.  Musculoskeletal:     Comments: Uses wheelchair in hospital  Skin:    General: Skin is warm and dry.     Findings: Erythema present.  Neurological:     Mental Status: She is alert and oriented to person, place, and time. Mental status is at baseline.  Psychiatric:        Mood and Affect: Mood normal.        Behavior: Behavior normal.     Stoma type/location:  RLQ end ileostomy Stomal assessment/size:  1 1/2, pink and moist  no bleeding noted today.  Peristomal assessment:  Bulging to peristomal area, consistent with hernia.  Has compromised seal and decreased wear time.  Treatment options for stomal/peristomal skin: Stoma powder and skin prep.  Switching to soft convex barrier, adding ostomy belt to improve seal and wear time and adding barrier strips to aid in pouch security .  Output: Liquid brown stool  Ostomy pouching: 2pc. Convex with barrier ring and belt.  Education provided:  we perform pouch change and I demonstrate new items (Belt, barrier strips)  I will update [employer] for supplies.  Is drinking fluids as she can, history dehydration.     Impression/dx  Ileostomy complication Risk for dehydration Parastomal hernia Irritant contact dermatitis Discussion   See back as needed.  Plan  Will update [employer]  fax [phone]    Visit time: 50 minutes.   Darice Cooley FNP-BC

## 2024-07-17 NOTE — Anesthesia Preprocedure Evaluation (Signed)
 Anesthesia Evaluation  Patient identified by MRN, date of birth, ID band Patient awake    Reviewed: Allergy & Precautions, NPO status , Patient's Chart, lab work & pertinent test results  History of Anesthesia Complications Negative for: history of anesthetic complications  Airway Mallampati: III  TM Distance: >3 FB Neck ROM: full    Dental  (+) Edentulous Upper, Edentulous Lower   Pulmonary sleep apnea , former smoker   Pulmonary exam normal        Cardiovascular hypertension, pulmonary hypertension(-) angina + CAD, + CABG and +CHF  (-) DOE + dysrhythmias Atrial Fibrillation + Valvular Problems/Murmurs   IMPRESSIONS     1. Left ventricular ejection fraction, by estimation, is 35 to 40%. The  left ventricle has moderately decreased function. The left ventricle  demonstrates global hypokinesis. Left ventricular diastolic parameters are  indeterminate.   2. Right ventricular systolic function is moderately reduced. The right  ventricular size is mildly enlarged. There is moderately elevated  pulmonary artery systolic pressure. The estimated right ventricular  systolic pressure is 55.9 mmHg.   3. Left atrial size was severely dilated.   4. Right atrial size was severely dilated.   5. The mitral valve is grossly normal. Severe mitral valve regurgitation.   6. Tricuspid valve regurgitation is severe.   7. The aortic valve was not well visualized. Aortic valve regurgitation  is trivial.     Neuro/Psych  Headaches TIA Neuromuscular disease  negative psych ROS   GI/Hepatic PUD,GERD  Medicated,,(+) Cirrhosis   Esophageal Varices      Endo/Other  diabetesHypothyroidism    Renal/GU negative Renal ROS  negative genitourinary   Musculoskeletal   Abdominal Normal abdominal exam  (+)   Peds  Hematology negative hematology ROS (+)   Anesthesia Other Findings Past Medical History: No date: AC (acromioclavicular) joint  bone spurs, right No date: Anemia No date: Arthritis No date: Atrial fibrillation (HCC)     Comment:  a.) CHA2DS2-VASc = 7 (age, sex, HFrEF, HTN, TIA x 2,               T2DM). b.) rate/rhythm controlled on oral carvedilol ;               chronically anticoagulated with warfarin. No date: Cardiac murmur No date: CHF (congestive heart failure) (HCC) No date: Chronic anticoagulation     Comment:  a.) warfarin 02/28/2024: Clostridium difficile colitis No date: Cor triatriatum     Comment:  a.) s/p repair 01/2004 No date: Coronary artery disease     Comment:  a.) 3v CABG 01/28/2004. b.) R/LHC 03/29/2017: small RCA               with occluded SVG; no significant Dz. Chronically               occluded LAD with patent SVG to D1/LAD. Insignificant Dz               in LCx. LM normal. No date: Cortical cataract No date: DOE (dyspnea on exertion) No date: DOE (dyspnea on exertion) No date: GERD (gastroesophageal reflux disease) No date: HFrEF (heart failure with reduced ejection fraction) (HCC)     Comment:  a.) TTE 10/27/2014: EF 40%; glob HK; mild BAE; triv AR,               mild MR/PR, mod TR.  b.) TTE 03/15/2017: EF 30%; glob HK;              mod BAE; mod pHTN (  RVSP 54.8 mmHg); mild PR, mod MR; sev               TR.  c.) R/LHC 03/29/2017: EF 30-35%. d.)TTE 10/25/2018:               EF 35%; mod BAE; mod BVE; glob HK; triv PR, mod MR, sev               TR; RVSP 57.7 mmHg; G2DD. 2004: History of shingles No date: Hyperlipidemia No date: Hypertension No date: Hypothyroidism No date: IBS (irritable bowel syndrome) No date: Ischemic cardiomyopathy     Comment:  a.) TTE 10/27/2014: EF 40%. b.) TTE 03/15/2017: EF 30%.               c.) R/LHC 04/01/2017: EF 30-35%. d.) TTE 10/25/2018: EF               35% 06/07/2023: Lower GI bleed No date: Lumbar scoliosis No date: Lumbar spinal stenosis 12/17/2018: Melena No date: Migraines No date: Osteoporosis 02/28/2024: Pancolitis (HCC) No date:  Pulmonary HTN (HCC)     Comment:  a.) TTE 03/15/2017: EF 30-35%; RVSP 54.8 mmHg. b.) R/LHC              03/29/2017: mean PA 33 mmHg, PCWP 22 mmHg, LVEDP 14 mmHg,              mean AO 79 mmHg; CO 7.89 L/min, CI 4.61 L/min/m 01/28/2004: S/P CABG x 3 No date: Sleep apnea No date: T2DM (type 2 diabetes mellitus) (HCC) 05/29/2016: TIA (transient ischemic attack) No date: Vitamin B 12 deficiency  Past Surgical History: No date: CARDIAC CATHETERIZATION No date: CATARACT EXTRACTION W/ INTRAOCULAR LENS IMPLANT; Bilateral     Comment:  Cataract Extraction with IOL 02/28/2024: COLECTOMY     Comment:  Procedure: COLECTOMY, TOTAL;  Surgeon: Lane Shope,              MD;  Location: [facility] ORS;  Service: General;; 12/19/2018: COLONOSCOPY; N/A     Comment:  Procedure: COLONOSCOPY;  Surgeon: Janalyn Keene NOVAK,               MD;  Location: [facility] ENDOS[facility];  Service: Endoscopy;                Laterality: N/A; 06/17/2020: COLONOSCOPY WITH PROPOFOL ; N/A     Comment:  Procedure: COLONOSCOPY WITH PROPOFOL ;  Surgeon:               Janalyn Keene NOVAK, MD;  Location: [facility] ENDOS[facility];                Service: Endoscopy;  Laterality: N/A; 03/30/2023: COLONOSCOPY WITH PROPOFOL ; N/A     Comment:  Procedure: COLONOSCOPY WITH PROPOFOL ;  Surgeon: Onita Elspeth Sharper, DO;  Location: [facility] ENDOSCOPY;  Service:               Endoscopy;  Laterality: N/A; 01/28/2004: COR TRIATRIATUM REPAIR; N/A 01/28/2004: CORONARY ARTERY BYPASS GRAFT; N/A     Comment:  3v CABG 12/19/2018: ESOPHAGOGASTRODUODENOSCOPY; N/A     Comment:  Procedure: ESOPHAGOGASTRODUODENOSCOPY (EGD);  Surgeon:               Janalyn Keene NOVAK, MD;  Location: [facility];                Service: Endoscopy;  Laterality: N/A; 06/17/2020: ESOPHAGOGASTRODUODENOSCOPY (EGD) WITH PROPOFOL ; N/A     Comment:  Procedure: ESOPHAGOGASTRODUODENOSCOPY (  EGD) WITH               PROPOFOL ;  Surgeon: Janalyn Keene NOVAK, MD;  Location:                [facility] ENDOSCOPY;  Service: Endoscopy;  Laterality: N/A; 03/30/2023: ESOPHAGOGASTRODUODENOSCOPY (EGD) WITH PROPOFOL ; N/A     Comment:  Procedure: ESOPHAGOGASTRODUODENOSCOPY (EGD) WITH               PROPOFOL ;  Surgeon: Onita Elspeth Sharper, DO;  Location:              [facility] ENDOSCOPY;  Service: Endoscopy;  Laterality: N/A; 02/28/2024: LAPAROTOMY; N/A     Comment:  Procedure: LAPAROTOMY, EXPLORATORY;  Surgeon: Lane Shope, MD;  Location: [facility] ORS;  Service: General;                Laterality: N/A; 03/29/2017: RIGHT/LEFT HEART CATH AND CORONARY ANGIOGRAPHY; Bilateral     Comment:  Procedure: Right/Left Heart Cath and Coronary               Angiography;  Surgeon: Vinie DELENA Jude, MD;  Location:               [facility];  Service: Cardiovascular;                Laterality: Bilateral; 04/05/2022: TOTAL KNEE ARTHROPLASTY; Right     Comment:  Procedure: TOTAL KNEE ARTHROPLASTY;  Surgeon: Kathlynn Sharper, MD;  Location: [facility] ORS;  Service: Orthopedics;               Laterality: Right; No date: TUBAL LIGATION 04/21/2017: VENTRAL HERNIA REPAIR; N/A     Comment:  Procedure: HERNIA REPAIR VENTRAL ADULT;  Surgeon: Claudene Larinda Bolder, MD;  Location: [facility] OR[facility];  Service:               General;  Laterality: N/A;  BMI    Body Mass Index: 26.08 kg/m      Reproductive/Obstetrics negative OB ROS                              Anesthesia Physical Anesthesia Plan  ASA: 3  Anesthesia Plan: General   Post-op Pain Management: Minimal or no pain anticipated   Induction: Intravenous  PONV Risk Score and Plan: 2 and Propofol  infusion and TIVA  Airway Management Planned: Natural Airway and Nasal Cannula  Additional Equipment:   Intra-op Plan:   Post-operative Plan:   Informed Consent: I have reviewed the patients History and Physical, chart, labs and discussed the procedure including the risks, benefits and alternatives for  the proposed anesthesia with the patient or authorized representative who has indicated his/her understanding and acceptance.     Dental Advisory Given  Plan Discussed with: Anesthesiologist, CRNA and Surgeon  Anesthesia Plan Comments:          Anesthesia Quick Evaluation

## 2024-07-18 ENCOUNTER — Ambulatory Visit: Admitting: Anesthesiology

## 2024-07-18 ENCOUNTER — Encounter
Admission: RE | Disposition: A | Discharge status: Home / Self Care | Payer: Self-pay | Source: Home / Self Care | Attending: Gastroenterology

## 2024-07-18 ENCOUNTER — Ambulatory Visit
Admission: RE | Admit: 2024-07-18 | Discharge: 2024-07-18 | Disposition: A | Discharge status: Home / Self Care | Attending: Gastroenterology | Admitting: Gastroenterology

## 2024-07-18 ENCOUNTER — Encounter: Payer: Self-pay | Admitting: Gastroenterology

## 2024-07-18 DIAGNOSIS — K31819 Angiodysplasia of stomach and duodenum without bleeding: Secondary | ICD-10-CM | POA: Insufficient documentation

## 2024-07-18 DIAGNOSIS — G4730 Sleep apnea, unspecified: Secondary | ICD-10-CM | POA: Insufficient documentation

## 2024-07-18 DIAGNOSIS — K7460 Unspecified cirrhosis of liver: Secondary | ICD-10-CM | POA: Insufficient documentation

## 2024-07-18 DIAGNOSIS — Z8711 Personal history of peptic ulcer disease: Secondary | ICD-10-CM | POA: Insufficient documentation

## 2024-07-18 DIAGNOSIS — K3189 Other diseases of stomach and duodenum: Secondary | ICD-10-CM | POA: Insufficient documentation

## 2024-07-18 DIAGNOSIS — G709 Myoneural disorder, unspecified: Secondary | ICD-10-CM | POA: Insufficient documentation

## 2024-07-18 DIAGNOSIS — I8500 Esophageal varices without bleeding: Secondary | ICD-10-CM | POA: Insufficient documentation

## 2024-07-18 DIAGNOSIS — K219 Gastro-esophageal reflux disease without esophagitis: Secondary | ICD-10-CM | POA: Insufficient documentation

## 2024-07-18 DIAGNOSIS — Z7984 Long term (current) use of oral hypoglycemic drugs: Secondary | ICD-10-CM | POA: Insufficient documentation

## 2024-07-18 DIAGNOSIS — Z87891 Personal history of nicotine dependence: Secondary | ICD-10-CM | POA: Insufficient documentation

## 2024-07-18 DIAGNOSIS — I5022 Chronic systolic (congestive) heart failure: Secondary | ICD-10-CM | POA: Insufficient documentation

## 2024-07-18 DIAGNOSIS — Z8673 Personal history of transient ischemic attack (TIA), and cerebral infarction without residual deficits: Secondary | ICD-10-CM | POA: Insufficient documentation

## 2024-07-18 DIAGNOSIS — I272 Pulmonary hypertension, unspecified: Secondary | ICD-10-CM | POA: Insufficient documentation

## 2024-07-18 DIAGNOSIS — Z7901 Long term (current) use of anticoagulants: Secondary | ICD-10-CM | POA: Insufficient documentation

## 2024-07-18 DIAGNOSIS — Z951 Presence of aortocoronary bypass graft: Secondary | ICD-10-CM | POA: Insufficient documentation

## 2024-07-18 DIAGNOSIS — I2510 Atherosclerotic heart disease of native coronary artery without angina pectoris: Secondary | ICD-10-CM | POA: Insufficient documentation

## 2024-07-18 DIAGNOSIS — I110 Hypertensive heart disease with heart failure: Secondary | ICD-10-CM | POA: Insufficient documentation

## 2024-07-18 DIAGNOSIS — I4891 Unspecified atrial fibrillation: Secondary | ICD-10-CM | POA: Insufficient documentation

## 2024-07-18 DIAGNOSIS — D500 Iron deficiency anemia secondary to blood loss (chronic): Secondary | ICD-10-CM | POA: Insufficient documentation

## 2024-07-18 DIAGNOSIS — K766 Portal hypertension: Secondary | ICD-10-CM | POA: Insufficient documentation

## 2024-07-18 DIAGNOSIS — E039 Hypothyroidism, unspecified: Secondary | ICD-10-CM | POA: Insufficient documentation

## 2024-07-18 DIAGNOSIS — K922 Gastrointestinal hemorrhage, unspecified: Secondary | ICD-10-CM | POA: Diagnosis not present

## 2024-07-18 DIAGNOSIS — K31811 Angiodysplasia of stomach and duodenum with bleeding: Secondary | ICD-10-CM | POA: Diagnosis not present

## 2024-07-18 DIAGNOSIS — Z79899 Other long term (current) drug therapy: Secondary | ICD-10-CM | POA: Insufficient documentation

## 2024-07-18 DIAGNOSIS — E119 Type 2 diabetes mellitus without complications: Secondary | ICD-10-CM | POA: Insufficient documentation

## 2024-07-18 HISTORY — PX: ESOPHAGOGASTRODUODENOSCOPY: SHX5428

## 2024-07-18 LAB — GLUCOSE, CAPILLARY: Glucose-Capillary: 125 mg/dL — ABNORMAL HIGH (ref 70–99)

## 2024-07-18 MED ORDER — SODIUM CHLORIDE 0.9 % IV SOLN: INTRAVENOUS | Status: DC

## 2024-07-18 MED ORDER — PHENYLEPHRINE 80 MCG/ML (10ML) SYRINGE FOR IV PUSH (FOR BLOOD PRESSURE SUPPORT)
PREFILLED_SYRINGE | INTRAVENOUS | Status: DC | PRN
  Administered 2024-07-18: 80 ug via INTRAVENOUS

## 2024-07-18 MED ORDER — PROPOFOL 10 MG/ML IV BOLUS
INTRAVENOUS | Status: DC | PRN
  Administered 2024-07-18: 60 mg via INTRAVENOUS

## 2024-07-18 MED ORDER — PROPOFOL 500 MG/50ML IV EMUL
INTRAVENOUS | Status: DC | PRN
  Administered 2024-07-18: 175 ug/kg/min via INTRAVENOUS

## 2024-07-18 MED ORDER — PROPOFOL 1000 MG/100ML IV EMUL
INTRAVENOUS | Status: AC
  Filled 2024-07-18: qty 300

## 2024-07-18 MED ORDER — LIDOCAINE HCL (CARDIAC) PF 100 MG/5ML IV SOSY
PREFILLED_SYRINGE | INTRAVENOUS | Status: DC | PRN
  Administered 2024-07-18: 50 mg via INTRAVENOUS

## 2024-07-18 NOTE — Interval H&P Note (Signed)
 History and Physical Interval Note: Preprocedure H&P from 07/18/24  was reviewed and there was no interval change after seeing and examining the patient.  Written consent was obtained from the patient after discussion of risks, benefits, and alternatives. Patient has consented to proceed with Push Enteroscopy with possible intervention   07/18/2024 7:45 AM  Ronal VEAR Harvest  has presented today for surgery, with the diagnosis of K92.2 (ICD-10-CM) - Gastrointestinal hemorrhage, unspecified gastrointestinal hemorrhage type.  The various methods of treatment have been discussed with the patient and family. After consideration of risks, benefits and other options for treatment, the patient has consented to  Procedure(s) with comments: EGD (ESOPHAGOGASTRODUODENOSCOPY) (N/A) - with Enteroscopy        DM on Plavix as a surgical intervention.  The patient's history has been reviewed, patient examined, no change in status, stable for surgery.  I have reviewed the patient's chart and labs.  Questions were answered to the patient's satisfaction.     Elspeth Ozell Jungling

## 2024-07-18 NOTE — Anesthesia Postprocedure Evaluation (Signed)
 Anesthesia Post Note  Patient: Tammy Arias  Procedure(s) Performed: EGD (ESOPHAGOGASTRODUODENOSCOPY)  Patient location during evaluation: PACU Anesthesia Type: General Level of consciousness: awake and alert Pain management: pain level controlled Vital Signs Assessment: post-procedure vital signs reviewed and stable Respiratory status: spontaneous breathing, nonlabored ventilation and respiratory function stable Cardiovascular status: blood pressure returned to baseline and stable Postop Assessment: no apparent nausea or vomiting Anesthetic complications: no   No notable events documented.   Last Vitals:  Vitals:   07/18/24 0836 07/18/24 0846  BP: (!) 86/52 98/78  Pulse: 78 78  Resp: 18 15  Temp:    SpO2: 100% 100%    Last Pain:  Vitals:   07/18/24 0836  TempSrc:   PainSc: 0-No pain                 Camellia Merilee Louder

## 2024-07-18 NOTE — Op Note (Addendum)
 [facility] Gastroenterology Patient Name: Tammy Arias Procedure Date: 07/18/2024 7:21 AM MRN: [id number] Account #: [id number] Date of Birth: 1955-12-16 Admit Type: Outpatient Age: 68 Room: [facility] ENDO ROOM 1 Gender: Female Note Status: Supervisor Override Instrument Name: Peds Colonoscope [id number] Procedure:             Upper GI endoscopy Indications:           Iron  deficiency anemia secondary to chronic blood                         loss, Follow-up of esophageal varices,                         Gastrointestinal Hemorrhage Providers:             Elspeth Ozell Onita ROSALEA, DO Referring MD:          Elspeth Ozell Onita DO, DO (Referring MD), Lynwood FALCON.                         Valora, MD (Referring MD) Medicines:             Monitored Anesthesia Care Complications:         No immediate complications. Estimated blood loss:                         Minimal. Procedure:             Pre-Anesthesia Assessment:                        - Prior to the procedure, a History and Physical was                         performed, and patient medications and allergies were                         reviewed. The patient is competent. The risks and                         benefits of the procedure and the sedation options and                         risks were discussed with the patient. All questions                         were answered and informed consent was obtained.                         Patient identification and proposed procedure were                         verified by the physician, the nurse, the anesthetist                         and the technician in the endoscopy suite. Mental                         Status Examination: alert and oriented. Airway  Examination: normal oropharyngeal airway and neck                         mobility. Respiratory Examination: clear to                         auscultation. CV Examination: irregularly irregular                          rate and rhythm. Prophylactic Antibiotics: The patient                         does not require prophylactic antibiotics. Prior                         Anticoagulants: The patient has taken Plavix                         (clopidogrel), last dose was 5 days prior to                         procedure. ASA Grade Assessment: IV - A patient with                         severe systemic disease that is a constant threat to                         life. After reviewing the risks and benefits, the                         patient was deemed in satisfactory condition to                         undergo the procedure. The anesthesia plan was to use                         monitored anesthesia care (MAC). Immediately prior to                         administration of medications, the patient was                         re-assessed for adequacy to receive sedatives. The                         heart rate, respiratory rate, oxygen saturations,                         blood pressure, adequacy of pulmonary ventilation, and                         response to care were monitored throughout the                         procedure. The physical status of the patient was                         re-assessed after the procedure.  After obtaining informed consent, the endoscope was                         passed under direct vision. Throughout the procedure,                         the patient's blood pressure, pulse, and oxygen                         saturations were monitored continuously. The                         Colonoscope was introduced through the mouth, and                         advanced to the jejunum. The upper GI endoscopy was                         accomplished without difficulty. The patient tolerated                         the procedure well. Findings:      The examined jejunum was normal. Estimated blood loss: none. Area was       tattooed with an injection  of 1 mL of [place] ink. Extent of exam marked       with tattoo      The examined duodenum was normal. Estimated blood loss: none.      Mild gastric antral vascular ectasia without bleeding was present in the       gastric antrum. Estimated blood loss: none.      Mild portal hypertensive gastropathy was found in the gastric body. No       active bleeding or old blood appreciated.      No gastric varices      Grade II varices were found in the lower third of the esophagus. No       stigmata of recent bleeding.      The exam of the esophagus was otherwise normal. Impression:            - Normal examined jejunum. Tattooed.                        - Normal examined duodenum.                        - Gastric antral vascular ectasia without bleeding.                        - Portal hypertensive gastropathy.                        - Grade II esophageal varices.                        - No specimens collected. Recommendation:        - Patient has a contact number available for                         emergencies. The signs and symptoms of potential  delayed complications were discussed with the patient.                         Return to normal activities tomorrow. Written                         discharge instructions were provided to the patient.                        - Discharge patient to home.                        - Resume previous diet.                        - Continue present medications.                        - Recommend increasing carvedilol  (coreg ) to 6.25mg ,                         if able to tolerate                        - Repeat upper endoscopy in 1 year for surveillance.                        - Return to GI office as previously scheduled.                        - The findings and recommendations were discussed with                         the patient. Procedure Code(s):     --- Professional ---                        [phone], Esophagogastroduodenoscopy,  flexible,                         transoral; with directed submucosal injection(s), any                         substance Diagnosis Code(s):     --- Professional ---                        K31.819, Angiodysplasia of stomach and duodenum                         without bleeding                        K76.6, Portal hypertension                        K31.89, Other diseases of stomach and duodenum                        I85.00, Esophageal varices without bleeding                        D50.0, Iron  deficiency anemia secondary to blood loss                         (  chronic) CPT copyright 2022 American Medical Association. All rights reserved. The codes documented in this report are preliminary and upon coder review may  be revised to meet current compliance requirements. Attending Participation:      I personally performed the entire procedure. Elspeth Jungling, DO Elspeth Ozell Jungling DO, DO 07/18/2024 8:23:29 AM This report has been signed electronically. Number of Addenda: 0 Note Initiated On: 07/18/2024 7:21 AM Estimated Blood Loss:  Estimated blood loss was minimal.      [facility]

## 2024-07-18 NOTE — H&P (Signed)
 Pre-Procedure H&P   Patient ID: Tammy Arias is a 68 y.o. female.  Gastroenterology Provider: Elspeth Ozell Jungling, DO  Referring Provider: Romero Antigua, PA PCP: Valora Agent, MD  Date: 07/18/2024  HPI Ms. Tammy Arias is a 68 y.o. female who presents today for Push Enteroscopy for Push Enteroscopy with possible intervention .  Plavix has been held for procedure (last dose Sunday)  She has no longer noticed any bloody or dark stools emanating from her ileostomy.  No pain.  Hemoglobin has improved to 8.8.  MCV is 108, iron  studies B12 and folate all normal.  Creatinine 1.09.  She is back on Coreg  3.125 twice a day. Taking PPI twice daily helped with bleeding.  Now on it once a day  Recently hospitalized in July for GI bleeding.  Previous EGD demonstrated esophageal varices along with hiatal hernia.  Biopsies negative for H. pylori and celiac disease   Past Medical History:  Diagnosis Date   AC (acromioclavicular) joint bone spurs, right    Anemia    Arthritis    Atrial fibrillation (HCC)    a.) CHA2DS2-VASc = 7 (age, sex, HFrEF, HTN, TIA x 2, T2DM). b.) rate/rhythm controlled on oral carvedilol ; chronically anticoagulated with warfarin.   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 b.) R/LHC 03/29/2017: small RCA with occluded SVG; no significant Dz. Chronically occluded LAD with patent SVG to D1/LAD. Insignificant Dz in LCx. LM normal.   Cortical cataract    DOE (dyspnea on exertion)    DOE (dyspnea on exertion)    GERD (gastroesophageal reflux disease)    HFrEF (heart failure with reduced ejection fraction) (HCC)    a.) TTE 10/27/2014: EF 40%; glob HK; mild BAE; triv AR, mild MR/PR, mod TR.  b.) TTE 03/15/2017: EF 30%; glob HK; mod BAE; mod pHTN (RVSP 54.8 mmHg); mild  PR, mod MR; sev TR.  c.) R/LHC 03/29/2017: EF 30-35%. d.)TTE 10/25/2018: EF 35%; mod BAE; mod BVE; glob HK; triv PR, mod MR, sev TR; RVSP 57.7 mmHg; G2DD.   History of shingles 2004   Hyperlipidemia    Hypertension    Hypothyroidism    IBS (irritable bowel syndrome)    Ischemic cardiomyopathy    a.) TTE 10/27/2014: EF 40%. b.) TTE 03/15/2017: EF 30%. c.) R/LHC 04/01/2017: EF 30-35%. d.) TTE 10/25/2018: EF 35%   Lower GI bleed 06/07/2023   Lumbar scoliosis    Lumbar spinal stenosis    Melena 12/17/2018   Migraines    Osteoporosis    Pancolitis (HCC) 02/28/2024   Pulmonary HTN (HCC)    a.) TTE 03/15/2017: EF 30-35%; RVSP 54.8 mmHg. 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   Sleep apnea    T2DM (type 2 diabetes mellitus) (HCC)    TIA (transient ischemic attack) 05/29/2016   Vitamin B 12 deficiency     Past Surgical History:  Procedure Laterality Date   CARDIAC CATHETERIZATION     CATARACT EXTRACTION W/ INTRAOCULAR LENS IMPLANT Bilateral    Cataract Extraction with IOL   COLECTOMY  02/28/2024   Procedure: COLECTOMY, TOTAL;  Surgeon: Lane Shope, MD;  Location:  [facility] ORS;  Service: General;;   COLONOSCOPY N/A 12/19/2018   Procedure: COLONOSCOPY;  Surgeon: Janalyn Keene NOVAK, MD;  Location: [facility] ENDOSCOPY;  Service: Endoscopy;  Laterality: N/A;   COLONOSCOPY WITH PROPOFOL  N/A 06/17/2020   Procedure: COLONOSCOPY WITH PROPOFOL ;  Surgeon: Janalyn Keene NOVAK, MD;  Location: [facility] ENDOSCOPY;  Service: Endoscopy;  Laterality: N/A;   COLONOSCOPY WITH PROPOFOL  N/A 03/30/2023   Procedure: COLONOSCOPY WITH PROPOFOL ;  Surgeon: Onita Elspeth Sharper, DO;  Location: [facility] ENDOSCOPY;  Service: Endoscopy;  Laterality: N/A;   COR TRIATRIATUM REPAIR N/A 01/28/2004   CORONARY ARTERY BYPASS GRAFT N/A 01/28/2004   3v CABG   ESOPHAGOGASTRODUODENOSCOPY N/A 12/19/2018   Procedure:  ESOPHAGOGASTRODUODENOSCOPY (EGD);  Surgeon: Janalyn Keene NOVAK, MD;  Location: [facility] ENDOSCOPY;  Service: Endoscopy;  Laterality: N/A;   ESOPHAGOGASTRODUODENOSCOPY N/A 06/11/2024   Procedure: EGD (ESOPHAGOGASTRODUODENOSCOPY);  Surgeon: Jinny Carmine, MD;  Location: [facility] ENDOSCOPY;  Service: Endoscopy;  Laterality: N/A;   ESOPHAGOGASTRODUODENOSCOPY (EGD) WITH PROPOFOL  N/A 06/17/2020   Procedure: ESOPHAGOGASTRODUODENOSCOPY (EGD) WITH PROPOFOL ;  Surgeon: Janalyn Keene NOVAK, MD;  Location: [facility] ENDOSCOPY;  Service: Endoscopy;  Laterality: N/A;   ESOPHAGOGASTRODUODENOSCOPY (EGD) WITH PROPOFOL  N/A 03/30/2023   Procedure: ESOPHAGOGASTRODUODENOSCOPY (EGD) WITH PROPOFOL ;  Surgeon: Onita Elspeth Sharper, DO;  Location: [facility] ENDOSCOPY;  Service: Endoscopy;  Laterality: N/A;   LAPAROTOMY N/A 02/28/2024   Procedure: LAPAROTOMY, EXPLORATORY;  Surgeon: Lane Shope, MD;  Location: [facility] ORS;  Service: General;  Laterality: N/A;   LEFT ATRIAL APPENDAGE OCCLUSION     RIGHT/LEFT HEART CATH AND CORONARY ANGIOGRAPHY Bilateral 03/29/2017   Procedure: Right/Left Heart Cath and Coronary Angiography;  Surgeon: Vinie DELENA Jude, MD;  Location: [facility] INVASIVE CV LAB;  Service: Cardiovascular;  Laterality: Bilateral;   TOTAL KNEE ARTHROPLASTY Right 04/05/2022   Procedure: TOTAL KNEE ARTHROPLASTY;  Surgeon: Kathlynn Sharper, MD;  Location: [facility] ORS;  Service: Orthopedics;  Laterality: Right;   TUBAL LIGATION     VENTRAL HERNIA REPAIR N/A 04/21/2017   Procedure: HERNIA REPAIR VENTRAL ADULT;  Surgeon: Claudene Larinda Bolder, MD;  Location: [facility] ORS;  Service: General;  Laterality: N/A;    Family History No h/o GI disease or malignancy  Review of Systems  Constitutional:  Negative for activity change, appetite change, chills, diaphoresis, fatigue, fever and unexpected weight change.  HENT:  Negative for trouble swallowing and voice change.   Respiratory:  Negative for shortness of breath and wheezing.   Cardiovascular:   Negative for chest pain, palpitations and leg swelling.  Gastrointestinal:  Negative for abdominal distention, abdominal pain, anal bleeding, blood in stool, constipation, diarrhea, nausea, rectal pain and vomiting.  Musculoskeletal:  Negative for arthralgias and myalgias.  Skin:  Negative for color change and pallor.  Neurological:  Negative for dizziness, syncope and weakness.  Psychiatric/Behavioral:  Negative for confusion.   All other systems reviewed and are negative.    Medications No current facility-administered medications on file prior to encounter.   Current Outpatient Medications on File Prior to Encounter  Medication Sig Dispense Refill   carvedilol  (COREG ) 3.125 MG tablet Take 3.125 mg by mouth 2 (two) times daily with a meal.     levothyroxine  (SYNTHROID ) 50 MCG tablet Take 50 mcg by mouth daily before breakfast.     albuterol  (PROVENTIL  HFA;VENTOLIN  HFA) 108 (90 Base) MCG/ACT inhaler Inhale 2 puffs into the lungs every 6 (six) hours as needed for wheezing or shortness of breath.     allopurinol  (ZYLOPRIM ) 300 MG tablet Take 300 mg by mouth daily.     aspirin  EC 81  MG tablet Take 81 mg by mouth. Take 1 tablet (81 mg total) by mouth once daily     clopidogrel (PLAVIX) 75 MG tablet Take 75 mg by mouth. Take 1 tablet (75 mg total) by mouth once daily for 182 days     empagliflozin  (JARDIANCE ) 10 MG TABS tablet Take 1 tablet by mouth daily.     latanoprost  (XALATAN ) 0.005 % ophthalmic solution Place 1 drop into both eyes at bedtime.     loperamide  (IMODIUM  A-D) 2 MG tablet Take 2 tablets (4 mg total) by mouth 4 (four) times daily as needed for diarrhea or loose stools. (Patient not taking: Reported on 06/07/2024) 100 tablet 2   magnesium  oxide (MAG-OX) 400 MG tablet Take 400 mg by mouth. Take 1 tablet (400 mg total) by mouth once daily     metFORMIN  (GLUCOPHAGE ) 1000 MG tablet Take 1,000 mg by mouth 2 (two) times daily with a meal.     pantoprazole  (PROTONIX ) 40 MG tablet Take 40  mg by mouth daily.     rosuvastatin  (CRESTOR ) 5 MG tablet Take 5 mg by mouth daily.     spironolactone  (ALDACTONE ) 25 MG tablet Take 0.5 tablets (12.5 mg total) by mouth daily.     torsemide  (DEMADEX ) 20 MG tablet Take 1 tablet (20 mg total) by mouth daily as needed (for weight gain or swelling). (Patient not taking: Reported on 04/04/2024)      Pertinent medications related to GI and procedure were reviewed by me with the patient prior to the procedure   Current Facility-Administered Medications:    0.9 %  sodium chloride  infusion, , Intravenous, Continuous, Onita Elspeth Sharper, DO  sodium chloride          Allergies  Allergen Reactions   Vioxx [Rofecoxib] Swelling   Allergies were reviewed by me prior to the procedure  Objective   Body mass index is 24.19 kg/m. Vitals:   07/18/24 0719 07/18/24 0732  BP:  105/60  Pulse:  64  Resp:  18  Temp:  (!) 96.6 F (35.9 C)  TempSrc:  Temporal  SpO2:  100%  Weight: 58.1 kg   Height: 5' 1 (1.549 m)     Physical Exam Vitals and nursing note reviewed.  Constitutional:      General: She is not in acute distress.    Appearance: Normal appearance. She is not ill-appearing, toxic-appearing or diaphoretic.  HENT:     Head: Normocephalic and atraumatic.     Nose: Nose normal.     Mouth/Throat:     Mouth: Mucous membranes are moist.     Pharynx: Oropharynx is clear.  Eyes:     General: No scleral icterus.    Extraocular Movements: Extraocular movements intact.  Cardiovascular:     Rate and Rhythm: Normal rate. Rhythm irregular.     Heart sounds: Normal heart sounds. No murmur heard.    No friction rub. No gallop.  Pulmonary:     Effort: Pulmonary effort is normal. No respiratory distress.     Breath sounds: Normal breath sounds. No wheezing, rhonchi or rales.  Abdominal:     General: Bowel sounds are normal. There is no distension.     Palpations: Abdomen is soft.     Tenderness: There is no abdominal tenderness. There is no  guarding or rebound.     Comments: Ostomy present.  Bag is not opaque but patient reports brown bowel movement  Musculoskeletal:     Cervical back: Neck supple.     Right  lower leg: No edema.     Left lower leg: No edema.  Skin:    General: Skin is warm and dry.     Coloration: Skin is not jaundiced or pale.  Neurological:     General: No focal deficit present.     Mental Status: She is alert and oriented to person, place, and time. Mental status is at baseline.  Psychiatric:        Mood and Affect: Mood normal.        Behavior: Behavior normal.        Thought Content: Thought content normal.        Judgment: Judgment normal.      Assessment:  Ms. Tammy Arias is a 68 y.o. female  who presents today for Push Enteroscopy for Push Enteroscopy with possible intervention .  Plan:  Push Enteroscopy with possible intervention today  Push Enteroscopy with possible biopsy, control of bleeding, polypectomy, and interventions as necessary has been discussed with the patient/patient representative. Informed consent was obtained from the patient/patient representative after explaining the indication, nature, and risks of the procedure including but not limited to death, bleeding, perforation, missed neoplasm/lesions, cardiorespiratory compromise, and reaction to medications. Opportunity for questions was given and appropriate answers were provided. Patient/patient representative has verbalized understanding is amenable to undergoing the procedure.   Elspeth Ozell Jungling, DO  [facility]  Portions of the record may have been created with voice recognition software. Occasional wrong-word or 'sound-a-like' substitutions may have occurred due to the inherent limitations of voice recognition software.  Read the chart carefully and recognize, using context, where substitutions may have occurred.

## 2024-07-18 NOTE — Transfer of Care (Signed)
 Immediate Anesthesia Transfer of Care Note  Patient: Paetyn H Brunson  Procedure(s) Performed: EGD (ESOPHAGOGASTRODUODENOSCOPY)  Patient Location: Endoscopy Unit  Anesthesia Type:General  Level of Consciousness: drowsy and patient cooperative  Airway & Oxygen Therapy: Patient Spontanous Breathing and Patient connected to face mask oxygen  Post-op Assessment: Report given to RN, Post -op Vital signs reviewed and stable, and Patient moving all extremities X 4  Post vital signs: Reviewed and stable  Last Vitals:  Vitals Value Taken Time  BP    Temp    Pulse    Resp    SpO2      Last Pain:  Vitals:   07/18/24 0732  TempSrc: Temporal  PainSc: 0-No pain         Complications: No notable events documented.

## 2024-07-19 ENCOUNTER — Emergency Department

## 2024-07-19 ENCOUNTER — Inpatient Hospital Stay
Admission: EM | Admit: 2024-07-19 | Discharge: 2024-07-22 | DRG: 378 | Disposition: A | Discharge status: Home / Self Care

## 2024-07-19 ENCOUNTER — Other Ambulatory Visit: Payer: Self-pay

## 2024-07-19 DIAGNOSIS — I25810 Atherosclerosis of coronary artery bypass graft(s) without angina pectoris: Secondary | ICD-10-CM | POA: Diagnosis present

## 2024-07-19 DIAGNOSIS — Z7982 Long term (current) use of aspirin: Secondary | ICD-10-CM

## 2024-07-19 DIAGNOSIS — M419 Scoliosis, unspecified: Secondary | ICD-10-CM | POA: Diagnosis present

## 2024-07-19 DIAGNOSIS — D539 Nutritional anemia, unspecified: Secondary | ICD-10-CM | POA: Diagnosis present

## 2024-07-19 DIAGNOSIS — K921 Melena: Secondary | ICD-10-CM | POA: Diagnosis not present

## 2024-07-19 DIAGNOSIS — M1990 Unspecified osteoarthritis, unspecified site: Secondary | ICD-10-CM | POA: Diagnosis present

## 2024-07-19 DIAGNOSIS — D61818 Other pancytopenia: Secondary | ICD-10-CM | POA: Diagnosis present

## 2024-07-19 DIAGNOSIS — Z9841 Cataract extraction status, right eye: Secondary | ICD-10-CM

## 2024-07-19 DIAGNOSIS — I8510 Secondary esophageal varices without bleeding: Secondary | ICD-10-CM | POA: Diagnosis present

## 2024-07-19 DIAGNOSIS — I110 Hypertensive heart disease with heart failure: Secondary | ICD-10-CM | POA: Diagnosis present

## 2024-07-19 DIAGNOSIS — Z681 Body mass index (BMI) 19 or less, adult: Secondary | ICD-10-CM

## 2024-07-19 DIAGNOSIS — K922 Gastrointestinal hemorrhage, unspecified: Principal | ICD-10-CM | POA: Diagnosis present

## 2024-07-19 DIAGNOSIS — Z8673 Personal history of transient ischemic attack (TIA), and cerebral infarction without residual deficits: Secondary | ICD-10-CM

## 2024-07-19 DIAGNOSIS — K219 Gastro-esophageal reflux disease without esophagitis: Secondary | ICD-10-CM | POA: Diagnosis present

## 2024-07-19 DIAGNOSIS — Z96651 Presence of right artificial knee joint: Secondary | ICD-10-CM | POA: Diagnosis present

## 2024-07-19 DIAGNOSIS — E1122 Type 2 diabetes mellitus with diabetic chronic kidney disease: Secondary | ICD-10-CM | POA: Diagnosis present

## 2024-07-19 DIAGNOSIS — I255 Ischemic cardiomyopathy: Secondary | ICD-10-CM | POA: Diagnosis present

## 2024-07-19 DIAGNOSIS — G4733 Obstructive sleep apnea (adult) (pediatric): Secondary | ICD-10-CM | POA: Diagnosis present

## 2024-07-19 DIAGNOSIS — Z87891 Personal history of nicotine dependence: Secondary | ICD-10-CM

## 2024-07-19 DIAGNOSIS — Z932 Ileostomy status: Secondary | ICD-10-CM

## 2024-07-19 DIAGNOSIS — I081 Rheumatic disorders of both mitral and tricuspid valves: Secondary | ICD-10-CM | POA: Diagnosis present

## 2024-07-19 DIAGNOSIS — K766 Portal hypertension: Secondary | ICD-10-CM | POA: Diagnosis present

## 2024-07-19 DIAGNOSIS — Z79899 Other long term (current) drug therapy: Secondary | ICD-10-CM

## 2024-07-19 DIAGNOSIS — R636 Underweight: Secondary | ICD-10-CM | POA: Diagnosis present

## 2024-07-19 DIAGNOSIS — K7469 Other cirrhosis of liver: Secondary | ICD-10-CM | POA: Diagnosis present

## 2024-07-19 DIAGNOSIS — Z7902 Long term (current) use of antithrombotics/antiplatelets: Secondary | ICD-10-CM

## 2024-07-19 DIAGNOSIS — E11649 Type 2 diabetes mellitus with hypoglycemia without coma: Secondary | ICD-10-CM | POA: Diagnosis not present

## 2024-07-19 DIAGNOSIS — R011 Cardiac murmur, unspecified: Secondary | ICD-10-CM | POA: Diagnosis present

## 2024-07-19 DIAGNOSIS — Z888 Allergy status to other drugs, medicaments and biological substances status: Secondary | ICD-10-CM

## 2024-07-19 DIAGNOSIS — N179 Acute kidney failure, unspecified: Secondary | ICD-10-CM

## 2024-07-19 DIAGNOSIS — K9401 Colostomy hemorrhage: Secondary | ICD-10-CM | POA: Diagnosis not present

## 2024-07-19 DIAGNOSIS — M109 Gout, unspecified: Secondary | ICD-10-CM | POA: Diagnosis present

## 2024-07-19 DIAGNOSIS — Z95818 Presence of other cardiac implants and grafts: Secondary | ICD-10-CM

## 2024-07-19 DIAGNOSIS — Z7901 Long term (current) use of anticoagulants: Secondary | ICD-10-CM

## 2024-07-19 DIAGNOSIS — K589 Irritable bowel syndrome without diarrhea: Secondary | ICD-10-CM | POA: Diagnosis present

## 2024-07-19 DIAGNOSIS — E039 Hypothyroidism, unspecified: Secondary | ICD-10-CM | POA: Diagnosis present

## 2024-07-19 DIAGNOSIS — Z9842 Cataract extraction status, left eye: Secondary | ICD-10-CM

## 2024-07-19 DIAGNOSIS — D62 Acute posthemorrhagic anemia: Secondary | ICD-10-CM | POA: Diagnosis present

## 2024-07-19 DIAGNOSIS — Z9049 Acquired absence of other specified parts of digestive tract: Secondary | ICD-10-CM

## 2024-07-19 DIAGNOSIS — E785 Hyperlipidemia, unspecified: Secondary | ICD-10-CM | POA: Diagnosis present

## 2024-07-19 DIAGNOSIS — Z79890 Hormone replacement therapy (postmenopausal): Secondary | ICD-10-CM

## 2024-07-19 DIAGNOSIS — I5022 Chronic systolic (congestive) heart failure: Secondary | ICD-10-CM | POA: Diagnosis present

## 2024-07-19 DIAGNOSIS — K449 Diaphragmatic hernia without obstruction or gangrene: Secondary | ICD-10-CM | POA: Diagnosis present

## 2024-07-19 DIAGNOSIS — K3189 Other diseases of stomach and duodenum: Secondary | ICD-10-CM | POA: Diagnosis present

## 2024-07-19 DIAGNOSIS — Z8249 Family history of ischemic heart disease and other diseases of the circulatory system: Secondary | ICD-10-CM

## 2024-07-19 DIAGNOSIS — I2510 Atherosclerotic heart disease of native coronary artery without angina pectoris: Secondary | ICD-10-CM | POA: Diagnosis present

## 2024-07-19 DIAGNOSIS — K8020 Calculus of gallbladder without cholecystitis without obstruction: Secondary | ICD-10-CM | POA: Diagnosis present

## 2024-07-19 DIAGNOSIS — Z961 Presence of intraocular lens: Secondary | ICD-10-CM | POA: Diagnosis present

## 2024-07-19 DIAGNOSIS — I482 Chronic atrial fibrillation, unspecified: Secondary | ICD-10-CM | POA: Diagnosis present

## 2024-07-19 DIAGNOSIS — I480 Paroxysmal atrial fibrillation: Secondary | ICD-10-CM | POA: Diagnosis present

## 2024-07-19 DIAGNOSIS — I272 Pulmonary hypertension, unspecified: Secondary | ICD-10-CM | POA: Diagnosis present

## 2024-07-19 DIAGNOSIS — Z833 Family history of diabetes mellitus: Secondary | ICD-10-CM

## 2024-07-19 DIAGNOSIS — M810 Age-related osteoporosis without current pathological fracture: Secondary | ICD-10-CM | POA: Diagnosis present

## 2024-07-19 DIAGNOSIS — K31811 Angiodysplasia of stomach and duodenum with bleeding: Secondary | ICD-10-CM | POA: Diagnosis present

## 2024-07-19 DIAGNOSIS — Z7984 Long term (current) use of oral hypoglycemic drugs: Secondary | ICD-10-CM

## 2024-07-19 DIAGNOSIS — Z8619 Personal history of other infectious and parasitic diseases: Secondary | ICD-10-CM

## 2024-07-19 LAB — COMPREHENSIVE METABOLIC PANEL WITH GFR
ALT: 17 U/L (ref 0–44)
AST: 37 U/L (ref 15–41)
Albumin: 4.1 g/dL (ref 3.5–5.0)
Alkaline Phosphatase: 139 U/L — ABNORMAL HIGH (ref 38–126)
Anion gap: 11 (ref 5–15)
BUN: 33 mg/dL — ABNORMAL HIGH (ref 8–23)
CO2: 17 mmol/L — ABNORMAL LOW (ref 22–32)
Calcium: 9.3 mg/dL (ref 8.9–10.3)
Chloride: 109 mmol/L (ref 98–111)
Creatinine, Ser: 1.2 mg/dL — ABNORMAL HIGH (ref 0.44–1.00)
GFR, Estimated: 49 mL/min — ABNORMAL LOW (ref >60)
Glucose, Bld: 113 mg/dL — ABNORMAL HIGH (ref 70–99)
Potassium: 4.4 mmol/L (ref 3.5–5.1)
Sodium: 137 mmol/L (ref 135–145)
Total Bilirubin: 0.8 mg/dL (ref 0.0–1.2)
Total Protein: 7.3 g/dL (ref 6.5–8.1)

## 2024-07-19 LAB — CBC
HCT: 28.6 % — ABNORMAL LOW (ref 36.0–46.0)
Hemoglobin: 8.7 g/dL — ABNORMAL LOW (ref 12.0–15.0)
MCH: 32.6 pg (ref 26.0–34.0)
MCHC: 30.4 g/dL (ref 30.0–36.0)
MCV: 107.1 fL — ABNORMAL HIGH (ref 80.0–100.0)
Platelets: 115 K/uL — ABNORMAL LOW (ref 150–400)
RBC: 2.67 MIL/uL — ABNORMAL LOW (ref 3.87–5.11)
RDW: 15.9 % — ABNORMAL HIGH (ref 11.5–15.5)
WBC: 4.3 K/uL (ref 4.0–10.5)
nRBC: 0 % (ref 0.0–0.2)

## 2024-07-19 LAB — HEMOGLOBIN AND HEMATOCRIT, BLOOD
HCT: 27.1 % — ABNORMAL LOW (ref 36.0–46.0)
HCT: 26.4 % — ABNORMAL LOW (ref 36.0–46.0)
Hemoglobin: 8.4 g/dL — ABNORMAL LOW (ref 12.0–15.0)
Hemoglobin: 8.3 g/dL — ABNORMAL LOW (ref 12.0–15.0)

## 2024-07-19 LAB — PROTIME-INR
INR: 1.2 (ref 0.8–1.2)
Prothrombin Time: 16.3 s — ABNORMAL HIGH (ref 11.4–15.2)

## 2024-07-19 MED ORDER — FLUTICASONE PROPIONATE 50 MCG/ACT NA SUSP
2.0000 | Freq: Every day | NASAL | Status: DC
  Filled 2024-07-19: qty 16

## 2024-07-19 MED ORDER — SODIUM CHLORIDE 0.9 % IV SOLN
50.0000 ug/h | INTRAVENOUS | Status: DC
  Filled 2024-07-19 (×2): qty 1

## 2024-07-19 MED ORDER — ONDANSETRON HCL 4 MG PO TABS
4.0000 mg | ORAL_TABLET | Freq: Four times a day (QID) | ORAL | Status: DC | PRN
Start: 2024-07-19 — End: 2024-07-22

## 2024-07-19 MED ORDER — ROSUVASTATIN CALCIUM 5 MG PO TABS
5.0000 mg | ORAL_TABLET | Freq: Every evening | ORAL | Status: DC
  Administered 2024-07-19 – 2024-07-21 (×3): 5 mg via ORAL
  Filled 2024-07-19 (×3): qty 1

## 2024-07-19 MED ORDER — ONDANSETRON HCL 4 MG/2ML IJ SOLN
4.0000 mg | Freq: Four times a day (QID) | INTRAMUSCULAR | Status: DC | PRN
Start: 2024-07-19 — End: 2024-07-22

## 2024-07-19 MED ORDER — ALBUTEROL SULFATE (2.5 MG/3ML) 0.083% IN NEBU: 2.5000 mg | INHALATION_SOLUTION | Freq: Four times a day (QID) | RESPIRATORY_TRACT | Status: DC | PRN

## 2024-07-19 MED ORDER — IOHEXOL 350 MG/ML SOLN
100.0000 mL | Freq: Once | INTRAVENOUS | Status: AC | PRN
  Administered 2024-07-19: 100 mL via INTRAVENOUS

## 2024-07-19 MED ORDER — HYDRALAZINE HCL 20 MG/ML IJ SOLN: 5.0000 mg | Freq: Four times a day (QID) | INTRAMUSCULAR | Status: DC | PRN

## 2024-07-19 MED ORDER — PANTOPRAZOLE SODIUM 40 MG PO TBEC
40.0000 mg | DELAYED_RELEASE_TABLET | Freq: Every day | ORAL | Status: DC
  Administered 2024-07-20 – 2024-07-22 (×3): 40 mg via ORAL
  Filled 2024-07-19 (×3): qty 1

## 2024-07-19 MED ORDER — LEVOTHYROXINE SODIUM 50 MCG PO TABS
50.0000 ug | ORAL_TABLET | Freq: Every day | ORAL | Status: DC
  Administered 2024-07-20 – 2024-07-22 (×3): 50 ug via ORAL
  Filled 2024-07-19 (×3): qty 1

## 2024-07-19 MED ORDER — SODIUM CHLORIDE 0.9 % IV BOLUS
500.0000 mL | Freq: Once | INTRAVENOUS | Status: AC
  Administered 2024-07-19: 500 mL via INTRAVENOUS

## 2024-07-19 MED ORDER — SODIUM CHLORIDE 0.9 % IV SOLN
2.0000 g | INTRAVENOUS | Status: DC
  Administered 2024-07-19 – 2024-07-20 (×2): 2 g via INTRAVENOUS
  Filled 2024-07-19 (×2): qty 20

## 2024-07-19 MED ORDER — ALLOPURINOL 100 MG PO TABS
300.0000 mg | ORAL_TABLET | Freq: Every day | ORAL | Status: DC
  Administered 2024-07-20 – 2024-07-22 (×3): 300 mg via ORAL
  Filled 2024-07-19 (×3): qty 3

## 2024-07-19 NOTE — ED Provider Notes (Signed)
 [facility] Provider Note    Event Date/Time   First MD Initiated Contact with Patient 07/19/24 1206     (approximate)   History   GI Problem  Pt to ED via POV from home. Pt reports has been dealing with bleeding from ostomy for a few weeks. Pt has EGD yesterday with no signs of bleeding. Pt reports started having some ostomy bleeding this am and it stopped. Pt went ahead and took plavix at 0800 and then noticed bleeding again at 0840. Pt reports has been bleeding since with blood clots.    HPI Tammy Arias is a 68 y.o. female PMH multiple medical comorbidities including paroxysmal A-fib not on anticoagulation, cirrhosis, esophageal varices, CHF, CAD with prior CABG, T2DM, hyperlipidemia, hypertension, ischemic cardiomyopathy, gastric AVM presents for evaluation of reported blood around ostomy site - Patient notes she has been having intermittent bleeding into her ostomy, underwent EGD yesterday, had been doing well and then this morning started having blood in her ostomy bag again.  Initially had some bright red blood, felt it went away, took her Plavix around 8 AM and then about 45 minutes later has been having ongoing blood since then.  Has passed large clots of blood into her ostomy.  Feels some fatigue though is otherwise asymptomatic.  No abdominal pain. - Not on anticoagulants - Is on Plavix for 6 months for recent Watchman procedure - Took an extra dose of her Protonix  this morning after bleeding started  Per chart review, appears was last seen in GI clinic on 07/08/2024 noting dark and bright red blood in her ostomy bag, planned for rpt EGD for eval.  Patient underwent an EGD yesterday: Impression: - Normal examined jejunum. Tattooed.  - Normal examined duodenum.  - Gastric antral vascular ectasia without bleeding.  - Portal hypertensive gastropathy.  - Grade II esophageal varices.  - No specimens collected. - Last p.o. intake about 8 AM  Appears  patient has had rectal bleeding in the past with multiple AVMs requiring transfusion (January 2025).  In April was admitted to ICU for shock and severe sepsis secondary to C. difficile colitis.  Had exploratory laparotomy at that time with total colectomy and end ileostomy creation.   Per chart review, was also admitted in July of this year for dark bloody stools.  EGD unremarkable at that time.  Cleared by GI to resume Plavix.       Physical Exam   Triage Vital Signs: ED Triage Vitals [07/19/24 1133]  Encounter Vitals Group     BP (!) 113/52     Girls Systolic BP Percentile      Girls Diastolic BP Percentile      Boys Systolic BP Percentile      Boys Diastolic BP Percentile      Pulse Rate 81     Resp 16     Temp 97.9 F (36.6 C)     Temp Source Oral     SpO2 98 %     Weight 127 lb 13.9 oz (58 kg)     Height 5' 1 (1.549 m)     Head Circumference      Peak Flow      Pain Score 0     Pain Loc      Pain Education      Exclude from Growth Chart     Most recent vital signs: Vitals:   07/19/24 1133  BP: (!) 113/52  Pulse: 81  Resp: 16  Temp: 97.9 F (36.6 C)  SpO2: 98%     General: Awake, no distress.  CV:  Good peripheral perfusion. RRR, RP 2+ Resp:  Normal effort. CTAB Abd:  No distention. Nontender to deep palpation throughout Ostomy: Moderate amount of frank dark blood (~ 30-50 cc) with large clot.  No significant stool    ED Results / Procedures / Treatments   Labs (all labs ordered are listed, but only abnormal results are displayed) Labs Reviewed  COMPREHENSIVE METABOLIC PANEL WITH GFR - Abnormal; Notable for the following components:      Result Value   CO2 17 (*)    Glucose, Bld 113 (*)    BUN 33 (*)    Creatinine, Ser 1.20 (*)    Alkaline Phosphatase 139 (*)    GFR, Estimated 49 (*)    All other components within normal limits  CBC - Abnormal; Notable for the following components:   RBC 2.67 (*)    Hemoglobin 8.7 (*)    HCT 28.6 (*)     MCV 107.1 (*)    RDW 15.9 (*)    Platelets 115 (*)    All other components within normal limits  HEMOGLOBIN AND HEMATOCRIT, BLOOD - Abnormal; Notable for the following components:   Hemoglobin 8.4 (*)    HCT 27.1 (*)    All other components within normal limits  PROTIME-INR  POC OCCULT BLOOD, ED  TYPE AND SCREEN     EKG  N/a   RADIOLOGY pending    PROCEDURES:  Critical Care performed: No  Procedures   MEDICATIONS ORDERED IN ED: Medications  sodium chloride  0.9 % bolus 500 mL (has no administration in time range)     IMPRESSION / MDM / ASSESSMENT AND PLAN / ED COURSE  I reviewed the triage vital signs and the nursing notes.                              DDX/MDM/AP: Differential diagnosis includes, but is not limited to, apparent recurrent GI bleed though unclear source --suspect likely due to gastric anesthesia, do not suspect variceal bleeding at this time though will continue to monitor closely.  Fortunately initial blood work shows hemoglobin is improved from prior though patient does have a notable amount of blood in her ostomy bag--Will check H&H.  Has already taken 2 dose of Protonix  this morning.  Will maintain n.p.o. and discuss with GI.  Plan: - Labs - N.p.o. - Consult GI, will discuss whether imaging may be indicated - Already took double dose of her Protonix   Patient's presentation is most consistent with acute presentation with potential threat to life or bodily function.  The patient is on the cardiac monitor to evaluate for evidence of arrhythmia and/or significant heart rate changes.  ED course below.  Discussed with GI who recommends CTA.  If positive, would need IR embolization.  If negative, GI will consider possible repeat procedural evaluation/intervention though will need to be mindful of her being on Plavix.  Already took double Protonix  dose this morning.  Admitted to hospitalist service pending CTA.  Repeat CBC overall stable.   Well-appearing and hemodynamically stable here in emergency department.  Clinical Course as of 07/19/24 1646  Fri Jul 19, 2024  1243 CBC with no leukocytosis, improved anemia, mild improved thrombocytopenia [MM]  1244 CMP with mild AKI, somewhat low bicarb, otherwise unremarkable [MM]  1308 GI paged to discuss [MM]  1344 Repeat H&H with  very mild drop in hemoglobin, overall stable [MM]  1421 D/w Dr. Therisa of GI CTA GI Bleed, if positive, will need embolization - if negative, admit and allow plavix to wear off before possible GI repeat procedural eval   [MM]  1459 Hospitalist consult order placed [MM]  1644 Call from radiology - No definitive GI bleeding [MM]    Clinical Course User Index [MM] Clarine Ozell LABOR, MD     FINAL CLINICAL IMPRESSION(S) / ED DIAGNOSES   Final diagnoses:  Upper GI bleed     Rx / DC Orders   ED Discharge Orders     None        Note:  This document was prepared using Dragon voice recognition software and may include unintentional dictation errors.   Clarine Ozell LABOR, MD 07/19/24 [phone]

## 2024-07-19 NOTE — ED Triage Notes (Addendum)
 Pt to ED via POV from home. Pt reports has been dealing with bleeding from ostomy for a few weeks. Pt has EGD yesterday with no signs of bleeding. Pt reports started having some ostomy bleeding this am and it stopped. Pt went ahead and took plavix at 0800 and then noticed bleeding again at 0840. Pt reports has been bleeding since with blood clots.

## 2024-07-19 NOTE — ED Notes (Signed)
 The pt was able to ambulate without assistance to the restroom but she did become sob. The pt advised she has been sob for an extended period of time.

## 2024-07-19 NOTE — ED Notes (Signed)
 Patient transported to CT

## 2024-07-19 NOTE — H&P (Addendum)
 History and Physical    Tammy Arias FMW:[id number] DOB: 1956/02/13 DOA: 07/19/2024  PCP: Valora Agent, MD (Confirm with patient/family/NH records and if not entered, this has to be entered at [facility] point of entry) Patient coming from: Home  I have personally briefly reviewed patient's old medical records in [facility]  Chief Complaint: GI bleed  HPI: Tammy Arias is a 68 y.o. female with medical history significant of liver cirrhosis with esophageal varices, AVM, history of GI bleed, HTN, HLD, IIDM, CAD status post CABG, chronic HFrEF with LVEF 35-40%, TIA, hypothyroidism, gout, OSA on CPAP at bedtime, PAF on ASA and Plavix status post Watchman procedure, pulmonary hypertension, IBS, GERD, C. 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  Patient started noticed bright red blood in the ileostomy this morning.  Before today she has been having intermittent maroon-colored stool in ostomy bag for about 1 month and eventually went to see GI and underwent EGD yesterday which showed esophageal varices but no bleedings and gastritis without bleeding.  Patient denies any abdominal pain no nauseous vomiting no chest pain, no fever or chills.  ED Course: Blood pressure borderline low 100/59 tachycardia not hypoxic.  Hemoglobin 8.7 compared to baseline 8.4-9.4 WBC 4.3 platelet 115 bicarb 17 BUN 35 creatinine 1.2 albumin  4.1.  GI was consulted who ordered stat CTA abdominal  Review of Systems: As per HPI otherwise 14 point review of systems negative.    Past Medical History:  Diagnosis Date   AC (acromioclavicular) joint bone spurs, right    Anemia    Arthritis    Atrial fibrillation (HCC)    a.) CHA2DS2-VASc = 7 (age, sex, HFrEF, HTN, TIA x 2, T2DM). b.) rate/rhythm controlled on oral carvedilol ; chronically anticoagulated with warfarin.   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 b.) R/LHC 03/29/2017: small RCA with occluded SVG; no significant Dz. Chronically occluded LAD with patent SVG to D1/LAD. Insignificant Dz in LCx. LM normal.   Cortical cataract    DOE (dyspnea on exertion)    DOE (dyspnea on exertion)    GERD (gastroesophageal reflux disease)    HFrEF (heart failure with reduced ejection fraction) (HCC)    a.) TTE 10/27/2014: EF 40%; glob HK; mild BAE; triv AR, mild MR/PR, mod TR.  b.) TTE 03/15/2017: EF 30%; glob HK; mod BAE; mod pHTN (RVSP 54.8 mmHg); mild PR, mod MR; sev TR.  c.) R/LHC 03/29/2017: EF 30-35%. d.)TTE 10/25/2018: EF 35%; mod BAE; mod BVE; glob HK; triv PR, mod MR, sev TR; RVSP 57.7 mmHg; G2DD.   History of shingles 2004   Hyperlipidemia    Hypertension    Hypothyroidism    IBS (irritable bowel syndrome)    Ischemic cardiomyopathy    a.) TTE 10/27/2014: EF 40%. b.) TTE 03/15/2017: EF 30%. c.) R/LHC 04/01/2017: EF 30-35%. d.) TTE 10/25/2018: EF 35%   Lower GI bleed 06/07/2023   Lumbar scoliosis    Lumbar spinal stenosis    Melena 12/17/2018   Migraines    Osteoporosis    Pancolitis (HCC) 02/28/2024   Pulmonary HTN (HCC)    a.) TTE 03/15/2017: EF 30-35%; RVSP 54.8 mmHg. b.) R/LHC 03/29/2017: mean PA 33 mmHg, PCWP 22  mmHg, LVEDP 14 mmHg, mean AO 79 mmHg; CO 7.89 L/min, CI 4.61 L/min/m   S/P CABG x 3 01/28/2004   Sleep apnea    T2DM (type 2 diabetes mellitus) (HCC)    TIA (transient ischemic attack) 05/29/2016   Vitamin B 12 deficiency     Past Surgical History:  Procedure Laterality Date   CARDIAC CATHETERIZATION     CATARACT EXTRACTION W/ INTRAOCULAR LENS IMPLANT Bilateral    Cataract Extraction with IOL   COLECTOMY  02/28/2024   Procedure: COLECTOMY, TOTAL;  Surgeon: Lane Shope, MD;  Location: [facility] ORS;  Service: General;;   COLONOSCOPY N/A  12/19/2018   Procedure: COLONOSCOPY;  Surgeon: Janalyn Keene NOVAK, MD;  Location: [facility] ENDOSCOPY;  Service: Endoscopy;  Laterality: N/A;   COLONOSCOPY WITH PROPOFOL  N/A 06/17/2020   Procedure: COLONOSCOPY WITH PROPOFOL ;  Surgeon: Janalyn Keene NOVAK, MD;  Location: [facility] ENDOSCOPY;  Service: Endoscopy;  Laterality: N/A;   COLONOSCOPY WITH PROPOFOL  N/A 03/30/2023   Procedure: COLONOSCOPY WITH PROPOFOL ;  Surgeon: Onita Elspeth Sharper, DO;  Location: [facility] ENDOSCOPY;  Service: Endoscopy;  Laterality: N/A;   COR TRIATRIATUM REPAIR N/A 01/28/2004   CORONARY ARTERY BYPASS GRAFT N/A 01/28/2004   3v CABG   ESOPHAGOGASTRODUODENOSCOPY N/A 12/19/2018   Procedure: ESOPHAGOGASTRODUODENOSCOPY (EGD);  Surgeon: Janalyn Keene NOVAK, MD;  Location: [facility] ENDOSCOPY;  Service: Endoscopy;  Laterality: N/A;   ESOPHAGOGASTRODUODENOSCOPY N/A 06/11/2024   Procedure: EGD (ESOPHAGOGASTRODUODENOSCOPY);  Surgeon: Jinny Carmine, MD;  Location: [facility] ENDOSCOPY;  Service: Endoscopy;  Laterality: N/A;   ESOPHAGOGASTRODUODENOSCOPY N/A 07/18/2024   Procedure: EGD (ESOPHAGOGASTRODUODENOSCOPY);  Surgeon: Onita Elspeth Sharper, DO;  Location: [facility] ENDOSCOPY;  Service: Gastroenterology;  Laterality: N/A;  with Enteroscopy        DM on Plavix   ESOPHAGOGASTRODUODENOSCOPY (EGD) WITH PROPOFOL  N/A 06/17/2020   Procedure: ESOPHAGOGASTRODUODENOSCOPY (EGD) WITH PROPOFOL ;  Surgeon: Janalyn Keene NOVAK, MD;  Location: [facility] ENDOSCOPY;  Service: Endoscopy;  Laterality: N/A;   ESOPHAGOGASTRODUODENOSCOPY (EGD) WITH PROPOFOL  N/A 03/30/2023   Procedure: ESOPHAGOGASTRODUODENOSCOPY (EGD) WITH PROPOFOL ;  Surgeon: Onita Elspeth Sharper, DO;  Location: [facility] ENDOSCOPY;  Service: Endoscopy;  Laterality: N/A;   LAPAROTOMY N/A 02/28/2024   Procedure: LAPAROTOMY, EXPLORATORY;  Surgeon: Lane Shope, MD;  Location: [facility] ORS;  Service: General;  Laterality: N/A;   LEFT ATRIAL APPENDAGE OCCLUSION     RIGHT/LEFT HEART CATH AND CORONARY ANGIOGRAPHY Bilateral  03/29/2017   Procedure: Right/Left Heart Cath and Coronary Angiography;  Surgeon: Vinie DELENA Jude, MD;  Location: [facility] INVASIVE CV LAB;  Service: Cardiovascular;  Laterality: Bilateral;   TOTAL KNEE ARTHROPLASTY Right 04/05/2022   Procedure: TOTAL KNEE ARTHROPLASTY;  Surgeon: Kathlynn Sharper, MD;  Location: [facility] ORS;  Service: Orthopedics;  Laterality: Right;   TUBAL LIGATION     VENTRAL HERNIA REPAIR N/A 04/21/2017   Procedure: HERNIA REPAIR VENTRAL ADULT;  Surgeon: Claudene Larinda Bolder, MD;  Location: [facility] ORS;  Service: General;  Laterality: N/A;     reports that she quit smoking about 25 years ago. Her smoking use included cigarettes. She has never used smokeless tobacco. She reports that she does not drink alcohol and does not use drugs.  Allergies  Allergen Reactions   Vioxx [Rofecoxib] Swelling    Family History  Problem Relation Age of Onset   Valvular heart disease Mother    Diabetes Father    Cancer Sister        lung   Cancer Sister        cervical   Breast cancer Cousin      Prior to Admission  medications   Medication Sig Start Date End Date Taking? Authorizing Provider  albuterol  (PROVENTIL  HFA;VENTOLIN  HFA) 108 (90 Base) MCG/ACT inhaler Inhale 2 puffs into the lungs every 6 (six) hours as needed for wheezing or shortness of breath.   Yes [provider]  allopurinol  (ZYLOPRIM ) 300 MG tablet Take 300 mg by mouth daily.   Yes [provider]  carvedilol  (COREG ) 3.125 MG tablet Take 3.125 mg by mouth 2 (two) times daily with a meal.   Yes [provider]  clopidogrel (PLAVIX) 75 MG tablet Take 75 mg by mouth. Take 1 tablet (75 mg total) by mouth once daily for 182 days 05/29/24 11/27/24 Yes [provider]  cyanocobalamin  (VITAMIN B12) 1000 MCG tablet Take 1,000 mcg by mouth daily.   Yes [provider]  empagliflozin  (JARDIANCE ) 10 MG TABS tablet Take 1 tablet by mouth daily. 04/17/24  Yes [provider]  fluticasone   (FLONASE ) 50 MCG/ACT nasal spray Place 2 sprays into the nose daily. 05/20/24  Yes [provider]  iron  polysaccharides (NIFEREX) 150 MG capsule Take 300 mg by mouth.  Take 2 capsules (300 mg total) by mouth once daily for 90 days 06/20/24 09/18/24 Yes [provider]  latanoprost  (XALATAN ) 0.005 % ophthalmic solution Place 1 drop into both eyes at bedtime.   Yes [provider]  levothyroxine  (SYNTHROID ) 50 MCG tablet Take 50 mcg by mouth daily before breakfast.   Yes [provider]  magnesium  oxide (MAG-OX) 400 MG tablet Take 400 mg by mouth. Take 1 tablet (400 mg total) by mouth once daily 05/20/24 05/20/25 Yes [provider]  metFORMIN  (GLUCOPHAGE ) 1000 MG tablet Take 1,000 mg by mouth 2 (two) times daily with a meal.   Yes [provider]  pantoprazole  (PROTONIX ) 40 MG tablet Take 40 mg by mouth daily. 01/28/21  Yes [provider]  rosuvastatin  (CRESTOR ) 5 MG tablet Take 5 mg by mouth daily. 02/20/24 02/19/25 Yes [provider]  spironolactone  (ALDACTONE ) 25 MG tablet Take 0.5 tablets (12.5 mg total) by mouth daily. 03/31/24  Yes Fausto Sor A, DO  aspirin  EC 81 MG tablet Take 81 mg by mouth. Take 1 tablet (81 mg total) by mouth once daily 05/29/24 05/29/25  [provider]  loperamide  (IMODIUM  A-D) 2 MG tablet Take 2 tablets (4 mg total) by mouth 4 (four) times daily as needed for diarrhea or loose stools. Patient not taking: Reported on 06/07/2024 04/04/24   Lane Shope, MD  torsemide  (DEMADEX ) 20 MG tablet Take 1 tablet (20 mg total) by mouth daily as needed (for weight gain or swelling). Patient not taking: Reported on 04/04/2024 03/31/24   Fausto Sor LABOR, DO    Physical Exam: Vitals:   07/19/24 1133 07/19/24 1530 07/19/24 1533  BP: (!) 113/52 (!) 90/50   Pulse: 81 70   Resp: 16 18   Temp: 97.9 F (36.6 C) 98 F (36.7 C)   TempSrc: Oral Oral   SpO2: 98% 97% 97%  Weight: 58 kg    Height: 5' 1 (1.549 m)       Constitutional: NAD, calm, comfortable Vitals:   07/19/24 1133 07/19/24 1530 07/19/24 1533  BP: (!) 113/52 (!) 90/50   Pulse: 81 70   Resp: 16 18   Temp: 97.9 F (36.6 C) 98 F (36.7 C)   TempSrc: Oral Oral   SpO2: 98% 97% 97%  Weight: 58 kg    Height: 5' 1 (1.549 m)     Eyes: PERRL, lids  and conjunctivae normal ENMT: Mucous membranes are moist. Posterior pharynx clear of any exudate or lesions.Normal dentition.  Neck: normal, supple, no masses, no thyromegaly Respiratory: clear to auscultation bilaterally, no wheezing, no crackles. Normal respiratory effort. No accessory muscle use.  Cardiovascular: Regular rate and rhythm, no murmurs / rubs / gallops. No extremity edema. 2+ pedal pulses. No carotid bruits.  Abdomen: no tenderness, no masses palpated. No hepatosplenomegaly. Bowel sounds positive.  Musculoskeletal: no clubbing / cyanosis. No joint deformity upper and lower extremities. Good ROM, no contractures. Normal muscle tone.  Skin: no rashes, lesions, ulcers. No induration Neurologic: CN 2-12 grossly intact. Sensation intact, DTR normal. Strength 5/5 in all 4.  Psychiatric: Normal judgment and insight. Alert and oriented x 3. Normal mood.     Labs on Admission: I have personally reviewed following labs and imaging studies  CBC: Recent Labs  Lab 07/19/24 1135 07/19/24 1332  WBC 4.3  --   HGB 8.7* 8.4*  HCT 28.6* 27.1*  MCV 107.1*  --   PLT 115*  --    Basic Metabolic Panel: Recent Labs  Lab 07/19/24 1135  NA 137  K 4.4  CL 109  CO2 17*  GLUCOSE 113*  BUN 33*  CREATININE 1.20*  CALCIUM  9.3   GFR: Estimated Creatinine Clearance: 36.3 mL/min (A) (by C-G formula based on SCr of 1.2 mg/dL (H)). Liver Function Tests: Recent Labs  Lab 07/19/24 1135  AST 37  ALT 17  ALKPHOS 139*  BILITOT 0.8  PROT 7.3  ALBUMIN  4.1   No results for input(s): LIPASE, AMYLASE in the last 168 hours. No results for input(s): AMMONIA in the last 168  hours. Coagulation Profile: No results for input(s): INR, PROTIME in the last 168 hours. Cardiac Enzymes: No results for input(s): CKTOTAL, CKMB, CKMBINDEX, TROPONINI in the last 168 hours. BNP (last 3 results) No results for input(s): PROBNP in the last 8760 hours. HbA1C: No results for input(s): HGBA1C in the last 72 hours. CBG: Recent Labs  Lab 07/18/24 0737  GLUCAP 125*   Lipid Profile: No results for input(s): CHOL, HDL, LDLCALC, TRIG, CHOLHDL, LDLDIRECT in the last 72 hours. Thyroid  Function Tests: No results for input(s): TSH, T4TOTAL, FREET4, T3FREE, THYROIDAB in the last 72 hours. Anemia Panel: No results for input(s): VITAMINB12, FOLATE, FERRITIN, TIBC, IRON , RETICCTPCT in the last 72 hours. Urine analysis:    Component Value Date/Time   COLORURINE YELLOW (A) 04/05/2024 0331   APPEARANCEUR CLEAR (A) 04/05/2024 0331   LABSPEC 1.006 04/05/2024 0331   PHURINE 5.0 04/05/2024 0331   GLUCOSEU NEGATIVE 04/05/2024 0331   HGBUR NEGATIVE 04/05/2024 0331   BILIRUBINUR NEGATIVE 04/05/2024 0331   KETONESUR NEGATIVE 04/05/2024 0331   PROTEINUR NEGATIVE 04/05/2024 0331   NITRITE NEGATIVE 04/05/2024 0331   LEUKOCYTESUR TRACE (A) 04/05/2024 0331    Radiological Exams on Admission: No results found.  EKG: None  Assessment/Plan Principal Problem:   GI bleed Active Problems:   Acute blood loss anemia  (please populate well all problems here in Problem List. (For example, if patient is on BP meds at home and you resume or decide to hold them, it is a problem that needs to be her. Same for CAD, COPD, HLD and so on)  Recurrent GI bleeding - Trending of hemoglobin 8.7> 8.4 - No tachycardia or hypotension, hold off transfusion this afternoon.  Continue to trend hemoglobin, consider transfusion for hemodynamic instability significant drop of H&H. - Continue PPI, source of bleeding likely recurrent AVM bleeding, as patient had  a  rather normal EGD yesterday. - GI consulted and CTA pending - Ceftriaxone  for SBP prophylaxis - Will discuss with GI regarding indication for octreotide   Cirrhosis with chronic total hypertension and esophageal varices Chronic thrombocytopenia - Appears to be compensated - Bleeding less likely caused by esophageal varices - Check A1c  CAD status post CABG - Hold off aspirin  and Plavix - Continue Crestor   Hypothyroidism - Continue Synthroid   Chronic HFrEF - Euvolemic - Hold off home BP and CHF medications including torsemide  and spironolactone   Total time spent on patient care 75 minutes.  DVT prophylaxis: SCD Code Status: Full code Family Communication: None at bedside Disposition Plan: Patient is sick with recurrent GI bleed likely will need acute GI intervention, expect more than 2 midnight hospital stay Consults called: GI Admission status: PCU admit   Cort ONEIDA Mana MD [facility] Pager [phone]  07/19/2024, 4:19 PM

## 2024-07-19 NOTE — Progress Notes (Signed)
 Patient reported that the bleeding in the ostomy bag has stopped last hour.  CTA reviewed, no active bleeding found.  Will hold off octreotide  given the bleeding has stopped.  Recheck H&H this evening.

## 2024-07-20 DIAGNOSIS — D62 Acute posthemorrhagic anemia: Secondary | ICD-10-CM

## 2024-07-20 DIAGNOSIS — K9401 Colostomy hemorrhage: Secondary | ICD-10-CM

## 2024-07-20 LAB — BASIC METABOLIC PANEL WITH GFR
Anion gap: 6 (ref 5–15)
BUN: 27 mg/dL — ABNORMAL HIGH (ref 8–23)
CO2: 19 mmol/L — ABNORMAL LOW (ref 22–32)
Calcium: 8.8 mg/dL — ABNORMAL LOW (ref 8.9–10.3)
Chloride: 111 mmol/L (ref 98–111)
Creatinine, Ser: 0.92 mg/dL (ref 0.44–1.00)
GFR, Estimated: >60 mL/min (ref >60)
Glucose, Bld: 68 mg/dL — ABNORMAL LOW (ref 70–99)
Potassium: 3.8 mmol/L (ref 3.5–5.1)
Sodium: 136 mmol/L (ref 135–145)

## 2024-07-20 LAB — FOLATE: Folate: 19.5 ng/mL (ref >5.9)

## 2024-07-20 LAB — CBC
HCT: 22.6 % — ABNORMAL LOW (ref 36.0–46.0)
Hemoglobin: 7.1 g/dL — ABNORMAL LOW (ref 12.0–15.0)
MCH: 32.9 pg (ref 26.0–34.0)
MCHC: 31.4 g/dL (ref 30.0–36.0)
MCV: 104.6 fL — ABNORMAL HIGH (ref 80.0–100.0)
Platelets: 71 K/uL — ABNORMAL LOW (ref 150–400)
RBC: 2.16 MIL/uL — ABNORMAL LOW (ref 3.87–5.11)
RDW: 15.8 % — ABNORMAL HIGH (ref 11.5–15.5)
WBC: 2.4 K/uL — ABNORMAL LOW (ref 4.0–10.5)
nRBC: 0.8 % — ABNORMAL HIGH (ref 0.0–0.2)

## 2024-07-20 LAB — PREPARE RBC (CROSSMATCH)

## 2024-07-20 LAB — GLUCOSE, CAPILLARY
Glucose-Capillary: 66 mg/dL — ABNORMAL LOW (ref 70–99)
Glucose-Capillary: 82 mg/dL (ref 70–99)

## 2024-07-20 LAB — IRON AND TIBC
Iron: 104 ug/dL (ref 28–170)
Saturation Ratios: 24 % (ref 10.4–31.8)
TIBC: 441 ug/dL (ref 250–450)
UIBC: 337 ug/dL

## 2024-07-20 LAB — HEMOGLOBIN AND HEMATOCRIT, BLOOD
HCT: 24.9 % — ABNORMAL LOW (ref 36.0–46.0)
Hemoglobin: 8.4 g/dL — ABNORMAL LOW (ref 12.0–15.0)

## 2024-07-20 LAB — HEMOGLOBIN: Hemoglobin: 7.1 g/dL — ABNORMAL LOW (ref 12.0–15.0)

## 2024-07-20 LAB — FERRITIN: Ferritin: 30 ng/mL (ref 11–307)

## 2024-07-20 MED ORDER — LACTATED RINGERS IV SOLN: INTRAVENOUS | Status: DC

## 2024-07-20 MED ORDER — DEXTROSE 50 % IV SOLN
50.0000 mL | Freq: Once | INTRAVENOUS | Status: DC
  Filled 2024-07-20: qty 50

## 2024-07-20 MED ORDER — SODIUM CHLORIDE 0.9% IV SOLUTION: Freq: Once | INTRAVENOUS | Status: AC

## 2024-07-20 NOTE — Plan of Care (Signed)
   Problem: Education: Goal: Knowledge of General Education information will improve Description: Including pain rating scale, medication(s)/side effects and non-pharmacologic comfort measures Outcome: Progressing   Problem: Clinical Measurements: Goal: Diagnostic test results will improve Outcome: Progressing   Problem: Activity: Goal: Risk for activity intolerance will decrease Outcome: Progressing

## 2024-07-20 NOTE — Consult Note (Signed)
 Ruel Kung , MD [address], [address], [place], [place], [address] Phone: [phone] Fax: [phone]  Consultation  Referring Provider:   Dr Jerelene  Primary Care Physician:  Valora Agent, MD Primary Gastroenterologist:  Dr. Onita         Reason for Consultation:     GI bleed  Date of Admission:  07/19/2024 Date of Consultation:  07/20/2024         HPI:   Tammy Arias is a 68 y.o. female is a patient established with Dr. Onita with a history of CAD, CABG cryptogenic cirrhosis esophageal varices has been admitted in the past with a GI bleed.  [facility] in January [facility] for [facility] bleeding and multiple AVMs required transfusion and in April 2025 admitted to the ICU for shock and severe sepsis secondary to C. difficile colitis had exploratory laparotomy with total colectomy and end ileostomy for septic shock secondary to C. difficile.  # Severe mitral regurgitation, moderate RV systolic dysfunction severe tricuspid regurgitation.  In July when admitted to the hospital had black stools in her ileostomy bag.  It appears that the bleeding was attributed to portal hypertensive gastropathyUnderwent a push enteroscopy on 07/18/2024 mild gastric antral vascular ectasia was seen no bleeding.  Grade 2 esophageal varices were seen.  The plan at the last office visit on 07/04/2024 was to perform an ileoscopy.  She presented to the hospital on 07/19/2024 with bright red blood in the ileostomy. I was contacted from the emergency room and I recommended a CT angiogram GI bleed protocol that showed high density material within the lumen of the colostomy there were noted enlarged venous collaterals around the ostomy that also extended to the subcutaneous tissue of the anterior abdominal wall suggesting portal hypertension there is also possible extravasation of contrast in the rectum but once again this also may represent enhancing mucosa.  03/28/2024 ultrasound right upper quadrant shows  cholelithiasis and hepatic cirrhosis. Patient is on Plavix.  On months hemoglobin is 8.4 with MCV of 192 on admission was 8.3 g and this morning is 7.1 g.  She states that yesterday morning she had a bout of bright red blood in her bag which stopped it was rapid subsequently she was doing fine for a while then took her Plavix yesterday i.e. Friday and then had a bigger bout of bleeding which brought her into the hospital then that as well stopped right away and has not had 1 since presently her ileostomy bag is empty.  She does not recollect any blood oozing out from outside the bag.  Denies any hematemesis denies any abdominal pain. Past Medical History:  Diagnosis Date   AC (acromioclavicular) joint bone spurs, right    Anemia    Arthritis    Atrial fibrillation (HCC)    a.) CHA2DS2-VASc = 7 (age, sex, HFrEF, HTN, TIA x 2, T2DM). b.) rate/rhythm controlled on oral carvedilol ; chronically anticoagulated with warfarin.   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 b.) R/LHC 03/29/2017: small RCA with occluded SVG; no significant Dz. Chronically occluded LAD with patent SVG to D1/LAD. Insignificant Dz in LCx. LM normal.   Cortical cataract    DOE (dyspnea on exertion)    DOE (dyspnea on exertion)  GERD (gastroesophageal reflux disease)    HFrEF (heart failure with reduced ejection fraction) (HCC)    a.) TTE 10/27/2014: EF 40%; glob HK; mild BAE; triv AR, mild MR/PR, mod TR.  b.) TTE 03/15/2017: EF 30%; glob HK; mod BAE; mod pHTN (RVSP 54.8 mmHg); mild PR, mod MR; sev TR.  c.) R/LHC 03/29/2017: EF 30-35%. d.)TTE 10/25/2018: EF 35%; mod BAE; mod BVE; glob HK; triv PR, mod MR, sev TR; RVSP 57.7 mmHg; G2DD.   History of shingles 2004   Hyperlipidemia    Hypertension    Hypothyroidism    IBS  (irritable bowel syndrome)    Ischemic cardiomyopathy    a.) TTE 10/27/2014: EF 40%. b.) TTE 03/15/2017: EF 30%. c.) R/LHC 04/01/2017: EF 30-35%. d.) TTE 10/25/2018: EF 35%   Lower GI bleed 06/07/2023   Lumbar scoliosis    Lumbar spinal stenosis    Melena 12/17/2018   Migraines    Osteoporosis    Pancolitis (HCC) 02/28/2024   Pulmonary HTN (HCC)    a.) TTE 03/15/2017: EF 30-35%; RVSP 54.8 mmHg. 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   Sleep apnea    T2DM (type 2 diabetes mellitus) (HCC)    TIA (transient ischemic attack) 05/29/2016   Vitamin B 12 deficiency     Past Surgical History:  Procedure Laterality Date   CARDIAC CATHETERIZATION     CATARACT EXTRACTION W/ INTRAOCULAR LENS IMPLANT Bilateral    Cataract Extraction with IOL   COLECTOMY  02/28/2024   Procedure: COLECTOMY, TOTAL;  Surgeon: Lane Shope, MD;  Location: [facility] ORS;  Service: General;;   COLONOSCOPY N/A 12/19/2018   Procedure: COLONOSCOPY;  Surgeon: Janalyn Keene NOVAK, MD;  Location: [facility] ENDOSCOPY;  Service: Endoscopy;  Laterality: N/A;   COLONOSCOPY WITH PROPOFOL  N/A 06/17/2020   Procedure: COLONOSCOPY WITH PROPOFOL ;  Surgeon: Janalyn Keene NOVAK, MD;  Location: [facility] ENDOSCOPY;  Service: Endoscopy;  Laterality: N/A;   COLONOSCOPY WITH PROPOFOL  N/A 03/30/2023   Procedure: COLONOSCOPY WITH PROPOFOL ;  Surgeon: Onita Elspeth Sharper, DO;  Location: [facility] ENDOSCOPY;  Service: Endoscopy;  Laterality: N/A;   COR TRIATRIATUM REPAIR N/A 01/28/2004   CORONARY ARTERY BYPASS GRAFT N/A 01/28/2004   3v CABG   ESOPHAGOGASTRODUODENOSCOPY N/A 12/19/2018   Procedure: ESOPHAGOGASTRODUODENOSCOPY (EGD);  Surgeon: Janalyn Keene NOVAK, MD;  Location: [facility] ENDOSCOPY;  Service: Endoscopy;  Laterality: N/A;   ESOPHAGOGASTRODUODENOSCOPY N/A 06/11/2024   Procedure: EGD (ESOPHAGOGASTRODUODENOSCOPY);  Surgeon: Jinny Carmine, MD;   Location: [facility] ENDOSCOPY;  Service: Endoscopy;  Laterality: N/A;   ESOPHAGOGASTRODUODENOSCOPY N/A 07/18/2024   Procedure: EGD (ESOPHAGOGASTRODUODENOSCOPY);  Surgeon: Onita Elspeth Sharper, DO;  Location: [facility] ENDOSCOPY;  Service: Gastroenterology;  Laterality: N/A;  with Enteroscopy        DM on Plavix   ESOPHAGOGASTRODUODENOSCOPY (EGD) WITH PROPOFOL  N/A 06/17/2020   Procedure: ESOPHAGOGASTRODUODENOSCOPY (EGD) WITH PROPOFOL ;  Surgeon: Janalyn Keene NOVAK, MD;  Location: [facility] ENDOSCOPY;  Service: Endoscopy;  Laterality: N/A;   ESOPHAGOGASTRODUODENOSCOPY (EGD) WITH PROPOFOL  N/A 03/30/2023   Procedure: ESOPHAGOGASTRODUODENOSCOPY (EGD) WITH PROPOFOL ;  Surgeon: Onita Elspeth Sharper, DO;  Location: [facility] ENDOSCOPY;  Service: Endoscopy;  Laterality: N/A;   LAPAROTOMY N/A 02/28/2024   Procedure: LAPAROTOMY, EXPLORATORY;  Surgeon: Lane Shope, MD;  Location: [facility] ORS;  Service: General;  Laterality: N/A;   LEFT ATRIAL APPENDAGE OCCLUSION     RIGHT/LEFT HEART CATH AND CORONARY ANGIOGRAPHY Bilateral 03/29/2017   Procedure: Right/Left Heart Cath and  Coronary Angiography;  Surgeon: Vinie DELENA Jude, MD;  Location: [facility] INVASIVE CV LAB;  Service: Cardiovascular;  Laterality: Bilateral;   TOTAL KNEE ARTHROPLASTY Right 04/05/2022   Procedure: TOTAL KNEE ARTHROPLASTY;  Surgeon: Kathlynn Sharper, MD;  Location: [facility] ORS;  Service: Orthopedics;  Laterality: Right;   TUBAL LIGATION     VENTRAL HERNIA REPAIR N/A 04/21/2017   Procedure: HERNIA REPAIR VENTRAL ADULT;  Surgeon: Claudene Larinda Bolder, MD;  Location: [facility] ORS;  Service: General;  Laterality: N/A;    Prior to Admission medications   Medication Sig Start Date End Date Taking? Authorizing Provider  albuterol  (PROVENTIL  HFA;VENTOLIN  HFA) 108 (90 Base) MCG/ACT inhaler Inhale 2 puffs into the lungs every 6 (six) hours as needed for wheezing or shortness of breath.   Yes [provider]  allopurinol  (ZYLOPRIM ) 300 MG tablet Take 300 mg by mouth daily.    Yes [provider]  carvedilol  (COREG ) 3.125 MG tablet Take 3.125 mg by mouth 2 (two) times daily with a meal.   Yes [provider]  clopidogrel (PLAVIX) 75 MG tablet Take 75 mg by mouth. Take 1 tablet (75 mg total) by mouth once daily for 182 days 05/29/24 11/27/24 Yes [provider]  cyanocobalamin  (VITAMIN B12) 1000 MCG tablet Take 1,000 mcg by mouth daily.   Yes [provider]  empagliflozin  (JARDIANCE ) 10 MG TABS tablet Take 1 tablet by mouth daily. 04/17/24  Yes [provider]  fluticasone  (FLONASE ) 50 MCG/ACT nasal spray Place 2 sprays into the nose daily. 05/20/24  Yes [provider]  iron  polysaccharides (NIFEREX) 150 MG capsule Take 300 mg by mouth.  Take 2 capsules (300 mg total) by mouth once daily for 90 days 06/20/24 09/18/24 Yes [provider]  latanoprost  (XALATAN ) 0.005 % ophthalmic solution Place 1 drop into both eyes at bedtime.   Yes [provider]  levothyroxine  (SYNTHROID ) 50 MCG tablet Take 50 mcg by mouth daily before breakfast.   Yes [provider]  magnesium  oxide (MAG-OX) 400 MG tablet Take 400 mg by mouth. Take 1 tablet (400 mg total) by mouth once daily 05/20/24 05/20/25 Yes [provider]  metFORMIN  (GLUCOPHAGE ) 1000 MG tablet Take 1,000 mg by mouth 2 (two) times daily with a meal.   Yes [provider]  pantoprazole  (PROTONIX ) 40 MG tablet Take 40 mg by mouth daily. 01/28/21  Yes [provider]  rosuvastatin  (CRESTOR ) 5 MG tablet Take 5 mg by mouth daily. 02/20/24 02/19/25 Yes [provider]  spironolactone  (ALDACTONE ) 25 MG tablet Take 0.5 tablets (12.5 mg total) by mouth daily. 03/31/24  Yes Fausto Burnard DELENA, DO  aspirin  EC 81 MG tablet Take 81 mg by mouth. Take 1 tablet (81 mg total) by mouth once daily 05/29/24 05/29/25  [provider]  loperamide  (IMODIUM  A-D) 2 MG tablet Take 2 tablets (4 mg total) by mouth 4 (four) times daily as needed for  diarrhea or loose stools. Patient not taking: Reported on 06/07/2024 04/04/24   Lane Shope, MD  torsemide  (DEMADEX ) 20 MG tablet Take 1 tablet (20 mg total) by mouth daily as needed (for weight gain or swelling). Patient not taking: Reported on 04/04/2024 03/31/24   Fausto Burnard DELENA, DO    Family History  Problem Relation Age of Onset   Valvular heart disease Mother    Diabetes Father    Cancer Sister        lung   Cancer Sister        cervical  Breast cancer Cousin      Social History   Tobacco Use   Smoking status: Former    Current packs/day: 0.00    Types: Cigarettes    Quit date: 03/30/1999    Years since quitting: 25.3   Smokeless tobacco: Never  Vaping Use   Vaping status: Never Used  Substance Use Topics   Alcohol use: No   Drug use: No    Allergies as of 07/19/2024 - Review Complete 07/19/2024  Allergen Reaction Noted   Vioxx [rofecoxib] Swelling 03/24/2017    Review of Systems:    All systems reviewed and negative except where noted in HPI.   Physical Exam:  Vital signs in last 24 hours: Temp:  [97.8 F (36.6 C)-98.2 F (36.8 C)] 97.8 F (36.6 C) (08/23 0805) Pulse Rate:  [70-83] 75 (08/23 0805) Resp:  [16-21] 17 (08/23 0805) BP: (90-113)/(48-75) 108/55 (08/23 0805) SpO2:  [97 %-100 %] 100 % (08/23 0805) Weight:  [58 kg-59.3 kg] 59.3 kg (08/22 2159) Last BM Date : 07/19/24 General:   Pleasant, cooperative in NAD Head:  Normocephalic and atraumatic. Eyes:   No icterus.   Conjunctiva pink. PERRLA. Ears:  Normal auditory acuity. Neck:  Supple; no masses or thyroidomegaly Lungs: Respirations even and unlabored. Lungs clear to auscultation bilaterally.   No wheezes, crackles, or rhonchi.  Heart:  Regular rate and rhythm;  Without murmur, clicks, rubs or gallops Abdomen:  Soft, nondistended, nontender. Normal bowel sounds. No appreciable masses or hepatomegaly.  No rebound or guarding.  Ileostomy bag in the right lower quadrant empty.  Skin around the  ileostomy bag appears normal I did not take out the bag today Neurologic:  Alert and oriented x3;  grossly normal neurologically. Skin:  Intact without significant lesions or rashes. Cervical Nodes:  No significant cervical adenopathy. Psych:  Alert and cooperative. Normal affect.  LAB RESULTS: Recent Labs    07/19/24 1135 07/19/24 1332 07/19/24 2202 07/20/24 0458  WBC 4.3  --   --  2.4*  HGB 8.7* 8.4* 8.3* 7.1*  HCT 28.6* 27.1* 26.4* 22.6*  PLT 115*  --   --  71*   BMET Recent Labs    07/19/24 1135 07/20/24 0458  NA 137 136  K 4.4 3.8  CL 109 111  CO2 17* 19*  GLUCOSE 113* 68*  BUN 33* 27*  CREATININE 1.20* 0.92  CALCIUM  9.3 8.8*   LFT Recent Labs    07/19/24 1135  PROT 7.3  ALBUMIN  4.1  AST 37  ALT 17  ALKPHOS 139*  BILITOT 0.8   PT/INR Recent Labs    07/19/24 1519  LABPROT 16.3*  INR 1.2    STUDIES: CT ANGIO GI BLEED Result Date: 07/19/2024 CLINICAL DATA:  Bleeding from ostomy. EXAM: CTA ABDOMEN AND PELVIS WITHOUT AND WITH CONTRAST TECHNIQUE: Multidetector CT imaging of the abdomen and pelvis was performed using the standard protocol during bolus administration of intravenous contrast. Multiplanar reconstructed images and MIPs were obtained and reviewed to evaluate the vascular anatomy. RADIATION DOSE REDUCTION: This exam was performed according to the departmental dose-optimization program which includes automated exposure control, adjustment of the mA and/or kV according to patient size and/or use of iterative reconstruction technique. CONTRAST:  OMNIPAQUE  IOHEXOL  350 MG/ML SOLN COMPARISON:  March 17, 2024. FINDINGS: VASCULAR Aorta: Atherosclerosis of abdominal aorta is noted without aneurysm or dissection. Celiac: Patent without evidence of aneurysm, dissection, vasculitis or significant stenosis. SMA: Patent without evidence of aneurysm, dissection, vasculitis or significant stenosis. Renals: Both  renal arteries are patent without evidence of  aneurysm, dissection, vasculitis, fibromuscular dysplasia or significant stenosis. IMA: Patent without evidence of aneurysm, dissection, vasculitis or significant stenosis. Inflow: Patent without evidence of aneurysm, dissection, vasculitis or significant stenosis. Proximal Outflow: Bilateral common femoral and visualized portions of the superficial and profunda femoral arteries are patent without evidence of aneurysm, dissection, vasculitis or significant stenosis. Veins: Enlarged collateral veins are noted around the ostomy site as well as in in the anterior subcutaneous tissues of the abdomen. This suggest possible portal hypertension. Enlarged hepatic veins are also noted suggesting right ventricular failure. Review of the MIP images confirms the above findings. NON-VASCULAR Lower chest: Small right pleural effusion is noted with minimal adjacent subsegmental atelectasis. Hepatobiliary: Cholelithiasis. No biliary dilatation is noted. Possible hepatic cirrhosis. Pancreas: Unremarkable. No pancreatic ductal dilatation or surrounding inflammatory changes. Spleen: Mild splenomegaly is noted. Adrenals/Urinary Tract: Adrenal glands are unremarkable. Kidneys are normal, without renal calculi, focal lesion, or hydronephrosis. Bladder is unremarkable. Stomach/Bowel: The stomach is unremarkable. Colostomy is noted in right lower quadrant. There is no evidence of bowel obstruction. There is the possibility of extravasation of contrast around the rectum. There is high density material seen within the lumen of the ostomy, but this may simply represent enhancing mucosa as opposed to bleeding. Lymphatic: No adenopathy. Reproductive: No definite abnormality seen. Other: Mild ascites is noted in the pelvis and around the liver and spleen. Musculoskeletal: No acute or significant osseous findings. IMPRESSION: Colostomy is noted in right lower quadrant. There is noted high density material seen within the lumen of the colostomy,  but this may simply represent enhancing mucosa as opposed to active gastrointestinal bleeding. There are noted enlarged venous collaterals around the ostomy that also extend through the subcutaneous tissues of the anterior dominant wall suggesting portal hypertension. There is also noted possible extravasation of contrast in the rectum, but once again this also may simply represent enhancing mucosa. These results were called by telephone at the time of interpretation on 07/19/2024 at 4:46 pm to provider MICHAEL MIAN , who verbally acknowledged these results. Slightly nodular hepatic contours are noted suggesting hepatic cirrhosis. Mild ascites. Mild splenomegaly. Cholelithiasis. Small right pleural effusion with adjacent subsegmental atelectasis. Aortic Atherosclerosis (ICD10-I70.0). Electronically Signed   By: Lynwood Landy Raddle M.D.   On: 07/19/2024 16:47      Impression / Plan:   Tammy Arias is a 68 y.o. y/o female with with a history of liver cirrhosis, coronary artery disease on Plavix, pulmonary hypertension with severe tricuspid regurgitation recent episode of C. difficile sepsis shock requiring colectomy and end ileostomy subsequently complicated by recurrent episodes of blood in the ileostomy bag recent upper endoscopy as well as push enteroscopy has only shown mild GAVE and grade 2 nonbleeding esophageal varices.  Plan as per outpatient was to perform ileoscopy.  The patient in the interim has come back with recurrent bleeding with blood noted in the ileostomy bag.  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  CT angiogram performed in the emergency room showed no active bleeding but showed dilated blood vessels collaterals in the vicinity of the differentials at this point of time would include AVMs of the small bowel as well as bleeding from the collaterals into the ostomy site.   Plan 1.   Continue PPI 2.  Would recommend capsule study on Sunday or Monday to evaluate small bowel 3.  Would recommend ileoscopy with possible ablation of any AVMs noted on the capsule once Plavix has been held for 5 days which would be next Thursday 4.  If the patient is having an active bleed I would recommend the on-call physician to look at the ostomy site and see if there is bleeding from the edge of the ostomy site which is usually where the collaterals bleed from and if active bleeding is noted would recommend discussion with either interventional radiology or vascular surgery to inject sclerosing agent into the blood vessel at the ostomy site that is actively bleeding to help with hemostasis.  It would not be possible for GI to stop the bleeding from the ostomy site with clips or cautery. 5.  Monitor CBC and transfuse as needed 6.  recommend checking B12 and iron  studies and if low to replace 7.  Since hemoglobin has been stable and has not had any further bleeding I have commenced her on a soft diet if she continues to be stable clinically then can advance the diet  Thank you for involving me in the care of this patient.    Risks, benefits, alternatives of Givens capsule discussed with patient to include but not limited to the rare risk of Given's capsule becoming lodged in the GI tract requiring surgical removal.  The patient agrees with this plan & consent will be obtained.    LOS: 1 day   Ruel Kung, MD  07/20/2024, 9:10 AM

## 2024-07-20 NOTE — Plan of Care (Signed)

## 2024-07-20 NOTE — Progress Notes (Addendum)
 PROGRESS NOTE    Tammy Arias  FMW:[id number] DOB: [birth year] DOA: 07/19/2024 PCP: Valora Agent, MD  Chief Complaint  Patient presents with   GI Problem    Hospital Course:  Tammy Arias is a 68 y.o. female with medical history significant of liver cirrhosis with esophageal varices, AVM, history of GI bleed, HTN, HLD, IIDM, CAD status post CABG, chronic HFrEF with LVEF 35-40%, TIA, hypothyroidism, gout, OSA on CPAP at bedtime, PAF on ASA and Plavix status post Watchman procedure, pulmonary hypertension, IBS, GERD, C. 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 Seen by GI, No further bleeding today Hospital course as below  Subjective: Patient was examined, denies ongoing bleeding from ileostomy site.  Was hypoglycemic this morning, improved after starting diet Monitor Hb, Seen by GI   Objective: Vitals:   07/19/24 2159 07/19/24 2337 07/20/24 0405 07/20/24 0805  BP: 108/75 (!) 98/49 (!) 109/54 (!) 108/55  Pulse:  82 72 75  Resp: 18 18 17 17   Temp: 97.8 F (36.6 C) 98 F (36.7 C) 97.9 F (36.6 C) 97.8 F (36.6 C)  TempSrc: Oral Oral  Oral  SpO2: 100% 99% 100% 100%  Weight: 59.3 kg     Height: 5' 11 (1.803 m)       Intake/Output Summary (Last 24 hours) at 07/20/2024 0906 Last data filed at 07/20/2024 0805 Gross per 24 hour  Intake 340 ml  Output 102 ml  Net 238 ml   Filed Weights   07/19/24 1133 07/19/24 2159  Weight: 58 kg 59.3 kg    Examination: Constitutional: NAD, calm, comfortable  Neck: normal, supple, no masses, no thyromegaly Respiratory: clear to auscultation bilaterally, no wheezing, no crackles. Normal respiratory effort. No accessory muscle use.  Cardiovascular: Regular rate and rhythm, no murmurs / rubs / gallops. No extremity edema. 2+ pedal pulses. No carotid bruits.  Abdomen: no tenderness, no masses palpated. No hepatosplenomegaly. Bowel sounds positive. Ileostomy  bag + Skin: no rashes, lesions, ulcers. No induration Neurologic: CN 2-12 grossly intact. Sensation intact, DTR normal. Strength 5/5 in all 4  Assessment & Plan:  Principal Problem:   GI bleed Active Problems:   Acute blood loss anemia  Recurrent GI bleeding - Hb downtrending 8.7 -> 8.3 -> 7.1 - CTA negative for active bleeding.  Dilated blood vessel collaterals in the vicinity - EGD 08/22 which showed esophageal varices but no bleedings and mild GAVE - Seen by GI, appreciate recs - Plan for Capsule endoscopy tomorrow to evaluate small bowel, NPO pm - Recommend ileoscopy with possible ablation of any AVMs on the capsule once Plavix has been held for 5 days - If active bleeding from age of ostomy site, where the collaterals bleed from, commend IR/vascular surgery to inject sclerosing agent into the blood vessel at the ostomy site  Acute blood loss anemia Macrocytic Anemia - Hb down trended to 7.1, transfuse 1u pRBC - Monitor hemoglobin and transfuse < 8 with active bleeding - FA normal - Iron  panel with IDA, B12 pending - On IV Iron  infusions outpatient   Cirrhosis with chronic total hypertension and esophageal varices Pancytopenia, Chronic thrombocytopenia - Appears to be compensated - Hematology outpatient follow up   CAD status post CABG - Hold off aspirin  and Plavix - Continue Crestor    Hypothyroidism - Continue Synthroid    Chronic HFrEF - Euvolemic - Hold off home BP and CHF medications including torsemide  and spironolactone   Hyperlipidemia -Crestor    Atrial  fibrillation, chronic - Hold Plavix and aspirin   Gout - Continue allopurinol   Type 2 diabetes mellitus with chronic kidney disease, without long-term current use of insulin   Recent A1c 5.9, well-controlled.  Patient takes metformin  at home Continue current insulin  therapy   GERD without esophagitis -On Protonix  IV as above   Ileostomy in place: No acute issues.   OSA - on CPAP  DVT prophylaxis:  SCD   Code Status: Full Code Disposition:  Home  Consultants:  Gastroenterology  Procedures:  None  Antimicrobials:  Anti-infectives (From admission, onward)    Start     Dose/Rate Route Frequency Ordered Stop   07/19/24 1630  cefTRIAXone  (ROCEPHIN ) 2 g in sodium chloride  0.9 % 100 mL IVPB        2 g 200 mL/hr over 30 Minutes Intravenous Every 24 hours 07/19/24 1628         Data Reviewed: I have personally reviewed following labs and imaging studies CBC: Recent Labs  Lab 07/19/24 1135 07/19/24 1332 07/19/24 2202 07/20/24 0458  WBC 4.3  --   --  2.4*  HGB 8.7* 8.4* 8.3* 7.1*  HCT 28.6* 27.1* 26.4* 22.6*  MCV 107.1*  --   --  104.6*  PLT 115*  --   --  71*   Basic Metabolic Panel: Recent Labs  Lab 07/19/24 1135 07/20/24 0458  NA 137 136  K 4.4 3.8  CL 109 111  CO2 17* 19*  GLUCOSE 113* 68*  BUN 33* 27*  CREATININE 1.20* 0.92  CALCIUM  9.3 8.8*   GFR: Estimated Creatinine Clearance: 54 mL/min (by C-G formula based on SCr of 0.92 mg/dL). Liver Function Tests: Recent Labs  Lab 07/19/24 1135  AST 37  ALT 17  ALKPHOS 139*  BILITOT 0.8  PROT 7.3  ALBUMIN  4.1   CBG: Recent Labs  Lab 07/18/24 0737  GLUCAP 125*    No results found for this or any previous visit (from the past 240 hours).   Radiology Studies: CT ANGIO GI BLEED Result Date: 07/19/2024 CLINICAL DATA:  Bleeding from ostomy. EXAM: CTA ABDOMEN AND PELVIS WITHOUT AND WITH CONTRAST TECHNIQUE: Multidetector CT imaging of the abdomen and pelvis was performed using the standard protocol during bolus administration of intravenous contrast. Multiplanar reconstructed images and MIPs were obtained and reviewed to evaluate the vascular anatomy. RADIATION DOSE REDUCTION: This exam was performed according to the departmental dose-optimization program which includes automated exposure control, adjustment of the mA and/or kV according to patient size and/or use of iterative reconstruction technique. CONTRAST:   OMNIPAQUE  IOHEXOL  350 MG/ML SOLN COMPARISON:  March 17, 2024. FINDINGS: VASCULAR Aorta: Atherosclerosis of abdominal aorta is noted without aneurysm or dissection. Celiac: Patent without evidence of aneurysm, dissection, vasculitis or significant stenosis. SMA: Patent without evidence of aneurysm, dissection, vasculitis or significant stenosis. Renals: Both renal arteries are patent without evidence of aneurysm, dissection, vasculitis, fibromuscular dysplasia or significant stenosis. IMA: Patent without evidence of aneurysm, dissection, vasculitis or significant stenosis. Inflow: Patent without evidence of aneurysm, dissection, vasculitis or significant stenosis. Proximal Outflow: Bilateral common femoral and visualized portions of the superficial and profunda femoral arteries are patent without evidence of aneurysm, dissection, vasculitis or significant stenosis. Veins: Enlarged collateral veins are noted around the ostomy site as well as in in the anterior subcutaneous tissues of the abdomen. This suggest possible portal hypertension. Enlarged hepatic veins are also noted suggesting right ventricular failure. Review of the MIP images confirms the above findings. NON-VASCULAR Lower chest: Small right pleural effusion is  noted with minimal adjacent subsegmental atelectasis. Hepatobiliary: Cholelithiasis. No biliary dilatation is noted. Possible hepatic cirrhosis. Pancreas: Unremarkable. No pancreatic ductal dilatation or surrounding inflammatory changes. Spleen: Mild splenomegaly is noted. Adrenals/Urinary Tract: Adrenal glands are unremarkable. Kidneys are normal, without renal calculi, focal lesion, or hydronephrosis. Bladder is unremarkable. Stomach/Bowel: The stomach is unremarkable. Colostomy is noted in right lower quadrant. There is no evidence of bowel obstruction. There is the possibility of extravasation of contrast around the rectum. There is high density material seen within the lumen of the  ostomy, but this may simply represent enhancing mucosa as opposed to bleeding. Lymphatic: No adenopathy. Reproductive: No definite abnormality seen. Other: Mild ascites is noted in the pelvis and around the liver and spleen. Musculoskeletal: No acute or significant osseous findings. IMPRESSION: Colostomy is noted in right lower quadrant. There is noted high density material seen within the lumen of the colostomy, but this may simply represent enhancing mucosa as opposed to active gastrointestinal bleeding. There are noted enlarged venous collaterals around the ostomy that also extend through the subcutaneous tissues of the anterior dominant wall suggesting portal hypertension. There is also noted possible extravasation of contrast in the rectum, but once again this also may simply represent enhancing mucosa. These results were called by telephone at the time of interpretation on 07/19/2024 at 4:46 pm to provider MICHAEL MIAN , who verbally acknowledged these results. Slightly nodular hepatic contours are noted suggesting hepatic cirrhosis. Mild ascites. Mild splenomegaly. Cholelithiasis. Small right pleural effusion with adjacent subsegmental atelectasis. Aortic Atherosclerosis (ICD10-I70.0). Electronically Signed   By: Lynwood Landy Raddle M.D.   On: 07/19/2024 16:47    Scheduled Meds:  allopurinol   300 mg Oral Daily   fluticasone   2 spray Each Nare Daily   levothyroxine   50 mcg Oral Q0600   pantoprazole   40 mg Oral Daily   rosuvastatin   5 mg Oral QPM   Continuous Infusions:  cefTRIAXone  (ROCEPHIN )  IV Stopped (07/19/24 1724)     LOS: 1 day  MDM: Patient is high risk for one or more organ failure.  They necessitate ongoing hospitalization for continued IV therapies and subsequent lab monitoring. Total time spent interpreting labs and vitals, reviewing the medical record, coordinating care amongst consultants and care team members, directly assessing and discussing care with the patient and/or family: 55  min Laree Lock, MD [facility]  To contact the attending physician between 7A-7P please use Epic Chat. To contact the covering physician during after hours 7P-7A, please review Amion.  07/20/2024, 9:06 AM   *This document has been created with the assistance of dictation software. Please excuse typographical errors. *

## 2024-07-21 ENCOUNTER — Encounter: Admission: EM | Disposition: A | Discharge status: Home / Self Care | Payer: Self-pay | Source: Ambulatory Visit

## 2024-07-21 ENCOUNTER — Encounter: Payer: Self-pay | Admitting: Gastroenterology

## 2024-07-21 DIAGNOSIS — K922 Gastrointestinal hemorrhage, unspecified: Secondary | ICD-10-CM

## 2024-07-21 DIAGNOSIS — D62 Acute posthemorrhagic anemia: Secondary | ICD-10-CM | POA: Diagnosis not present

## 2024-07-21 HISTORY — PX: GIVENS CAPSULE STUDY: SHX5432

## 2024-07-21 LAB — CBC
HCT: 25.2 % — ABNORMAL LOW (ref 36.0–46.0)
Hemoglobin: 8.2 g/dL — ABNORMAL LOW (ref 12.0–15.0)
MCH: 31.8 pg (ref 26.0–34.0)
MCHC: 32.5 g/dL (ref 30.0–36.0)
MCV: 97.7 fL (ref 80.0–100.0)
Platelets: 80 K/uL — ABNORMAL LOW (ref 150–400)
RBC: 2.58 MIL/uL — ABNORMAL LOW (ref 3.87–5.11)
RDW: 17.8 % — ABNORMAL HIGH (ref 11.5–15.5)
WBC: 2.8 K/uL — ABNORMAL LOW (ref 4.0–10.5)
nRBC: 0 % (ref 0.0–0.2)

## 2024-07-21 LAB — BASIC METABOLIC PANEL WITH GFR
Anion gap: 6 (ref 5–15)
BUN: 24 mg/dL — ABNORMAL HIGH (ref 8–23)
CO2: 18 mmol/L — ABNORMAL LOW (ref 22–32)
Calcium: 8.3 mg/dL — ABNORMAL LOW (ref 8.9–10.3)
Chloride: 108 mmol/L (ref 98–111)
Creatinine, Ser: 0.94 mg/dL (ref 0.44–1.00)
GFR, Estimated: >60 mL/min (ref >60)
Glucose, Bld: 143 mg/dL — ABNORMAL HIGH (ref 70–99)
Potassium: 3.9 mmol/L (ref 3.5–5.1)
Sodium: 132 mmol/L — ABNORMAL LOW (ref 135–145)

## 2024-07-21 LAB — VITAMIN B12: Vitamin B-12: 1015 pg/mL — ABNORMAL HIGH (ref 180–914)

## 2024-07-21 NOTE — Plan of Care (Signed)

## 2024-07-21 NOTE — Progress Notes (Signed)
 GI capusle swallowed around 0700 per Terry, endo RN.

## 2024-07-21 NOTE — Progress Notes (Signed)
 PROGRESS NOTE    Tammy Arias  FMW:[id number] DOB: 04-15-1956 DOA: 07/19/2024 PCP: Valora Agent, MD  Chief Complaint  Patient presents with   GI Problem    Hospital Course:  Tammy Arias is a 68 y.o. female with medical history significant of liver cirrhosis with esophageal varices, AVM, history of GI bleed, HTN, HLD, IIDM, CAD status post CABG, chronic HFrEF with LVEF 35-40%, TIA, hypothyroidism, gout, OSA on CPAP at bedtime, PAF on ASA and Plavix status post Watchman procedure, pulmonary hypertension, IBS, GERD, C. 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 Seen by GI, No further bleeding today Hospital course as below  Subjective: Patient was examined, denies bleeding from ileostomy site in last 24 hrs. Capsule endoscopy by GI today  Objective: Vitals:   07/21/24 0308 07/21/24 0843 07/21/24 1130 07/21/24 1548  BP: 112/70 104/60 (!) 109/51 103/66  Pulse: 83 72 66 72  Resp: 20 20 17 16   Temp: 98.2 F (36.8 C) 98 F (36.7 C) 97.9 F (36.6 C) 97.7 F (36.5 C)  TempSrc: Oral Oral    SpO2: 98% 100% 98% 100%  Weight:      Height:        Intake/Output Summary (Last 24 hours) at 07/21/2024 1633 Last data filed at 07/21/2024 1419 Gross per 24 hour  Intake 1244 ml  Output 400 ml  Net 844 ml   Filed Weights   07/19/24 1133 07/19/24 2159  Weight: 58 kg 59.3 kg    Examination: Constitutional: NAD, calm, comfortable  Neck: normal, supple, no masses, no thyromegaly Respiratory: clear to auscultation bilaterally, no wheezing, no crackles. Normal respiratory effort. No accessory muscle use.  Cardiovascular: Regular rate and rhythm, no murmurs / rubs / gallops. No extremity edema. 2+ pedal pulses. No carotid bruits.  Abdomen: no tenderness, no masses palpated. No hepatosplenomegaly. Bowel sounds positive. Ileostomy bag + Skin: no rashes, lesions, ulcers. No induration Neurologic: CN 2-12 grossly  intact. Sensation intact, DTR normal. Strength 5/5 in all 4  Assessment & Plan:  Principal Problem:   GI bleed Active Problems:   Acute blood loss anemia  Recurrent GI bleeding - Hb downtrending 8.7 -> 8.3 -> 7.1 s/p 1u pRBC 8.2 - CTA negative for active bleeding.  Dilated blood vessel collaterals in the vicinity - EGD 08/22 which showed esophageal varices but no bleedings and mild GAVE - Seen by GI, appreciate recs - Capsule endoscopy today to evaluate small bowel - Recommend ileoscopy with possible ablation of any AVMs on the capsule once Plavix has been held for 5 days - If active bleeding from age of ostomy site, where the collaterals bleed from, commend IR/vascular surgery to inject sclerosing agent into the blood vessel at the ostomy site  Acute blood loss anemia Macrocytic Anemia - Hb down trended to 7.1, s/p 1u pRBC, Hb improved to 8.2 - Monitor hemoglobin and transfuse < 8 with active bleeding - FA normal - Iron  panel with IDA, B12 normal - On IV Iron  infusions outpatient   Cirrhosis with chronic total hypertension and esophageal varices Pancytopenia, Chronic thrombocytopenia - Appears to be compensated - Hematology outpatient follow up   CAD status post CABG - Hold off aspirin  and Plavix - Continue Crestor    Hypothyroidism - Continue Synthroid    Chronic HFrEF - Euvolemic - Hold off home BP and CHF medications including torsemide  and spironolactone  - resume tomorrow  Hyperlipidemia -Crestor    Atrial fibrillation, chronic - Hold Plavix and  aspirin   Gout - Continue allopurinol   Type 2 diabetes mellitus with chronic kidney disease, without long-term current use of insulin   Recent A1c 5.9, well-controlled.  Patient takes metformin  at home Continue current insulin  therapy   GERD without esophagitis -On Protonix  IV as above   Ileostomy in place: No acute issues.   OSA - on CPAP  DVT prophylaxis: SCD   Code Status: Full Code Disposition:   Home  Consultants:  Gastroenterology  Procedures:  None  Antimicrobials:  Anti-infectives (From admission, onward)    Start     Dose/Rate Route Frequency Ordered Stop   07/19/24 1630  cefTRIAXone  (ROCEPHIN ) 2 g in sodium chloride  0.9 % 100 mL IVPB  Status:  Discontinued        2 g 200 mL/hr over 30 Minutes Intravenous Every 24 hours 07/19/24 1628 07/20/24 1638       Data Reviewed: I have personally reviewed following labs and imaging studies CBC: Recent Labs  Lab 07/19/24 1135 07/19/24 1332 07/19/24 2202 07/20/24 0458 07/20/24 1647 07/20/24 2303 07/21/24 0531  WBC 4.3  --   --  2.4*  --   --  2.8*  HGB 8.7* 8.4* 8.3* 7.1* 7.1* 8.4* 8.2*  HCT 28.6* 27.1* 26.4* 22.6*  --  24.9* 25.2*  MCV 107.1*  --   --  104.6*  --   --  97.7  PLT 115*  --   --  71*  --   --  80*   Basic Metabolic Panel: Recent Labs  Lab 07/19/24 1135 07/20/24 0458 07/21/24 0531  NA 137 136 132*  K 4.4 3.8 3.9  CL 109 111 108  CO2 17* 19* 18*  GLUCOSE 113* 68* 143*  BUN 33* 27* 24*  CREATININE 1.20* 0.92 0.94  CALCIUM  9.3 8.8* 8.3*   GFR: Estimated Creatinine Clearance: 52.9 mL/min (by C-G formula based on SCr of 0.94 mg/dL). Liver Function Tests: Recent Labs  Lab 07/19/24 1135  AST 37  ALT 17  ALKPHOS 139*  BILITOT 0.8  PROT 7.3  ALBUMIN  4.1   CBG: Recent Labs  Lab 07/18/24 0737 07/20/24 1115 07/20/24 1215  GLUCAP 125* 66* 82    No results found for this or any previous visit (from the past 240 hours).   Radiology Studies: No results found.   Scheduled Meds:  allopurinol   300 mg Oral Daily   dextrose   50 mL Intravenous Once   fluticasone   2 spray Each Nare Daily   levothyroxine   50 mcg Oral Q0600   pantoprazole   40 mg Oral Daily   rosuvastatin   5 mg Oral QPM   Continuous Infusions:     LOS: 2 days  MDM: Patient is high risk for one or more organ failure.  They necessitate ongoing hospitalization for continued IV therapies and subsequent lab monitoring. Total  time spent interpreting labs and vitals, reviewing the medical record, coordinating care amongst consultants and care team members, directly assessing and discussing care with the patient and/or family: 55 min Laree Lock, MD [facility]  To contact the attending physician between 7A-7P please use Epic Chat. To contact the covering physician during after hours 7P-7A, please review Amion.  07/21/2024, 4:33 PM   *This document has been created with the assistance of dictation software. Please excuse typographical errors. *

## 2024-07-22 ENCOUNTER — Ambulatory Visit

## 2024-07-22 DIAGNOSIS — Z558 Other problems related to education and literacy: Secondary | ICD-10-CM | POA: Insufficient documentation

## 2024-07-22 DIAGNOSIS — K4020 Bilateral inguinal hernia, without obstruction or gangrene, not specified as recurrent: Secondary | ICD-10-CM | POA: Insufficient documentation

## 2024-07-22 DIAGNOSIS — K921 Melena: Secondary | ICD-10-CM

## 2024-07-22 DIAGNOSIS — D62 Acute posthemorrhagic anemia: Secondary | ICD-10-CM | POA: Diagnosis not present

## 2024-07-22 LAB — CBC
HCT: 27.2 % — ABNORMAL LOW (ref 36.0–46.0)
Hemoglobin: 8.8 g/dL — ABNORMAL LOW (ref 12.0–15.0)
MCH: 31.7 pg (ref 26.0–34.0)
MCHC: 32.4 g/dL (ref 30.0–36.0)
MCV: 97.8 fL (ref 80.0–100.0)
Platelets: 78 K/uL — ABNORMAL LOW (ref 150–400)
RBC: 2.78 MIL/uL — ABNORMAL LOW (ref 3.87–5.11)
RDW: 17.4 % — ABNORMAL HIGH (ref 11.5–15.5)
WBC: 2.3 K/uL — ABNORMAL LOW (ref 4.0–10.5)
nRBC: 0 % (ref 0.0–0.2)

## 2024-07-22 LAB — BASIC METABOLIC PANEL WITH GFR
Anion gap: 9 (ref 5–15)
BUN: 19 mg/dL (ref 8–23)
CO2: 18 mmol/L — ABNORMAL LOW (ref 22–32)
Calcium: 8.8 mg/dL — ABNORMAL LOW (ref 8.9–10.3)
Chloride: 108 mmol/L (ref 98–111)
Creatinine, Ser: 0.88 mg/dL (ref 0.44–1.00)
GFR, Estimated: >60 mL/min (ref >60)
Glucose, Bld: 126 mg/dL — ABNORMAL HIGH (ref 70–99)
Potassium: 4.6 mmol/L (ref 3.5–5.1)
Sodium: 135 mmol/L (ref 135–145)

## 2024-07-22 MED ORDER — SPIRONOLACTONE 12.5 MG HALF TABLET
12.5000 mg | ORAL_TABLET | Freq: Every day | ORAL | Status: DC
  Filled 2024-07-22: qty 1

## 2024-07-22 MED ORDER — CARVEDILOL 3.125 MG PO TABS: 3.1250 mg | ORAL_TABLET | Freq: Two times a day (BID) | ORAL | Status: DC

## 2024-07-22 NOTE — Plan of Care (Signed)
   Problem: Education: Goal: Knowledge of General Education information will improve Description: Including pain rating scale, medication(s)/side effects and non-pharmacologic comfort measures Outcome: Progressing   Problem: Activity: Goal: Risk for activity intolerance will decrease Outcome: Progressing   Problem: Coping: Goal: Level of anxiety will decrease Outcome: Progressing

## 2024-07-22 NOTE — Care Management Important Message (Signed)
 Important Message  Patient Details  Name: Tammy Arias MRN: [id number] Date of Birth: [birth year]   Important Message Given:  Yes - Medicare IM     Tammy Arias 07/22/2024, 12:39 PM

## 2024-07-22 NOTE — Plan of Care (Signed)

## 2024-07-22 NOTE — Progress Notes (Signed)
  Tammy Copping, MD [facility]   [address], [address] [place], [place] [address] Phone: [phone] Fax : [phone]   Subjective: The patient had a capsule endoscopy that did not show any bleeding throughout the entire GI tract and mated all the way to ostomy bag.  Patient reports that she is not having any further bleeding.   Objective: Vital signs in last 24 hours: Vitals:   07/22/24 0528 07/22/24 0819 07/22/24 1000 07/22/24 1146  BP: (!) 110/56 (!) 112/54  104/66  Pulse: 73 71  80  Resp: 18  16   Temp: 97.7 F (36.5 C) 97.7 F (36.5 C)  98.1 F (36.7 C)  TempSrc: Oral     SpO2: 100% 100%  99%  Weight:      Height:       Weight change:   Intake/Output Summary (Last 24 hours) at 07/22/2024 1333 Last data filed at 07/22/2024 1000 Gross per 24 hour  Intake 1380 ml  Output 200 ml  Net 1180 ml     Exam: General: The patient is sitting up in bed in no apparent distress.   Lab Results: @LABTEST2 @ Micro Results: No results found for this or any previous visit (from the past 240 hours). Studies/Results: No results found. Medications: I have reviewed the patient's current medications. Scheduled Meds:  allopurinol   300 mg Oral Daily   dextrose   50 mL Intravenous Once   fluticasone   2 spray Each Nare Daily   levothyroxine   50 mcg Oral Q0600   pantoprazole   40 mg Oral Daily   rosuvastatin   5 mg Oral QPM   spironolactone   12.5 mg Oral Daily   Continuous Infusions: PRN Meds:.albuterol , hydrALAZINE , ondansetron  **OR** ondansetron  (ZOFRAN ) IV   Assessment: Principal Problem:   GI bleed Active Problems:   Acute blood loss anemia    Plan: The patient has appeared to have stopped bleeding with her capsule endoscopy showing no sign of any fresh or old blood or source of the bleeding.  I have discussed this with the hospitalist and the patient and have given her a copy of her report.  If the patient should continue any GI bleeding then a bleeding scan and possible  embolization would be needed.   LOS: 3 days   Tammy Copping, MD.FACG 07/22/2024, 1:33 PM Pager [phone] 7am-5pm  Check AMION for 5pm -7am coverage and on weekends

## 2024-07-22 NOTE — Discharge Summary (Signed)
 Physician Discharge Summary   Patient: Tammy Arias MRN: [id number] DOB: 05/17/1956  Admit date:     07/19/2024  Discharge date: 07/22/24  Discharge Physician: Laree Lock   PCP: Valora Agent, MD   Recommendations at discharge:  Follow up with PCP within 1 week - Repeat BMP, CBC Monitor BP/HR and resume Coreg  as tolerated  Follow up with Cardiology outpatient Follow up with Hematology outpatient for pancytopenia  Discharge Diagnoses: Principal Problem:   GI bleed Active Problems:   Acute blood loss anemia  Resolved Problems:   * No resolved hospital problems. [facility] Course: Tammy Arias is a 68 y.o. female with medical history significant of liver cirrhosis with esophageal varices, AVM, history of GI bleed, HTN, HLD, IIDM, CAD status post CABG, chronic HFrEF with LVEF 35-40%, TIA, hypothyroidism, gout, OSA on CPAP at bedtime, PAF on Plavix status post Watchman procedure, pulmonary hypertension, IBS, GERD, C. 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 Seen by GI, No further bleeding > 48 hrs Hospital course as below  GI bleeding - resolved - Hb downtrending 8.7 -> 8.3 -> 7.1 s/p 1u pRBC 8.8 - CTA negative for active bleeding.  Dilated blood vessel collaterals in the vicinity - EGD 08/22 which showed esophageal varices but no bleedings and mild GAVE - Seen by GI, Capsule endoscopy no sign of fresh or old blood or source of bleeding - Patient is being discharged home to follow-up with PCP and repeat hemoglobin in 1 week - If patient develops rebleeding then will need bleeding scan and possible embolization  Acute blood loss anemia Macrocytic Anemia - Hb down trended to 7.1, s/p 1u pRBC, Hb improved to 8.8 - Monitor hemoglobin and transfuse < 8 with active bleeding - FA normal - Iron  panel with IDA, B12 normal - On IV Iron  infusions outpatient   Cirrhosis with chronic total  hypertension and esophageal varices Pancytopenia, Chronic thrombocytopenia - Appears to be compensated - Hematology outpatient follow up   CAD status post CABG - on Plavix - Continue Crestor    Chronic HFrEF HTN - Euvolemic - On spironolactone   and torsemide  prn - Coreg  held - resume outpatient as BP tolerates   Atrial fibrillation, chronic S/p Watchman procedure - Resume Plavix, plan to follow up with her Cardiologist concerning plavix duration - not taking asa   GERD without esophagitis PPI  Consultants: Gastroenterology Procedures performed: capsule study  Disposition: Home Diet recommendation:  Cardiac diet DISCHARGE MEDICATION: Allergies as of 07/22/2024       Reactions   Vioxx [rofecoxib] Swelling        Medication List     PAUSE taking these medications    carvedilol  3.125 MG tablet Wait to take this until your doctor or other care provider tells you to start again. Commonly known as: COREG  Take 3.125 mg by mouth 2 (two) times daily with a meal.       STOP taking these medications    aspirin  EC 81 MG tablet       TAKE these medications    albuterol  108 (90 Base) MCG/ACT inhaler Commonly known as: VENTOLIN  HFA Inhale 2 puffs into the lungs every 6 (six) hours as needed for wheezing or shortness of breath.   allopurinol  300 MG tablet Commonly known as: ZYLOPRIM  Take 300 mg by mouth daily.   clopidogrel 75 MG tablet Commonly known as: PLAVIX Take 75 mg by mouth. Take 1 tablet (75 mg total)  by mouth once daily for 182 days   cyanocobalamin  1000 MCG tablet Commonly known as: VITAMIN B12 Take 1,000 mcg by mouth daily.   empagliflozin  10 MG Tabs tablet Commonly known as: JARDIANCE  Take 1 tablet by mouth daily.   fluticasone  50 MCG/ACT nasal spray Commonly known as: FLONASE  Place 2 sprays into the nose daily.   iron  polysaccharides 150 MG capsule Commonly known as: NIFEREX Take 300 mg by mouth.  Take 2 capsules (300 mg total) by mouth  once daily for 90 days   latanoprost  0.005 % ophthalmic solution Commonly known as: XALATAN  Place 1 drop into both eyes at bedtime.   levothyroxine  50 MCG tablet Commonly known as: SYNTHROID  Take 50 mcg by mouth daily before breakfast.   loperamide  2 MG tablet Commonly known as: Imodium  A-D Take 2 tablets (4 mg total) by mouth 4 (four) times daily as needed for diarrhea or loose stools.   magnesium  oxide 400 MG tablet Commonly known as: MAG-OX Take 400 mg by mouth. Take 1 tablet (400 mg total) by mouth once daily   metFORMIN  1000 MG tablet Commonly known as: GLUCOPHAGE  Take 1,000 mg by mouth 2 (two) times daily with a meal.   pantoprazole  40 MG tablet Commonly known as: PROTONIX  Take 40 mg by mouth daily.   rosuvastatin  5 MG tablet Commonly known as: CRESTOR  Take 5 mg by mouth daily.   spironolactone  25 MG tablet Commonly known as: ALDACTONE  Take 0.5 tablets (12.5 mg total) by mouth daily.   torsemide  20 MG tablet Commonly known as: DEMADEX  Take 1 tablet (20 mg total) by mouth daily as needed (for weight gain or swelling).        Discharge Exam: Filed Weights   07/19/24 1133 07/19/24 2159  Weight: 58 kg 59.3 kg    Constitutional: NAD, calm, comfortable  Neck: normal, supple, no masses, no thyromegaly Respiratory: clear to auscultation bilaterally, no wheezing, no crackles. Normal respiratory effort. No accessory muscle use.  Cardiovascular: Regular rate and rhythm, no murmurs / rubs / gallops. No extremity edema. 2+ pedal pulses. No carotid bruits.  Abdomen: no tenderness, no masses palpated. No hepatosplenomegaly. Bowel sounds positive. Ileostomy bag + Skin: no rashes, lesions, ulcers. No induration Neurologic: CN 2-12 grossly intact. Sensation intact, DTR normal. Strength 5/5 in all 4  Condition at discharge: good  The results of significant diagnostics from this hospitalization (including imaging, microbiology, ancillary and laboratory) are listed below  for reference.   Imaging Studies: CT ANGIO GI BLEED Result Date: 07/19/2024 CLINICAL DATA:  Bleeding from ostomy. EXAM: CTA ABDOMEN AND PELVIS WITHOUT AND WITH CONTRAST TECHNIQUE: Multidetector CT imaging of the abdomen and pelvis was performed using the standard protocol during bolus administration of intravenous contrast. Multiplanar reconstructed images and MIPs were obtained and reviewed to evaluate the vascular anatomy. RADIATION DOSE REDUCTION: This exam was performed according to the departmental dose-optimization program which includes automated exposure control, adjustment of the mA and/or kV according to patient size and/or use of iterative reconstruction technique. CONTRAST:  OMNIPAQUE  IOHEXOL  350 MG/ML SOLN COMPARISON:  March 17, 2024. FINDINGS: VASCULAR Aorta: Atherosclerosis of abdominal aorta is noted without aneurysm or dissection. Celiac: Patent without evidence of aneurysm, dissection, vasculitis or significant stenosis. SMA: Patent without evidence of aneurysm, dissection, vasculitis or significant stenosis. Renals: Both renal arteries are patent without evidence of aneurysm, dissection, vasculitis, fibromuscular dysplasia or significant stenosis. IMA: Patent without evidence of aneurysm, dissection, vasculitis or significant stenosis. Inflow: Patent without evidence of aneurysm, dissection, vasculitis or significant  stenosis. Proximal Outflow: Bilateral common femoral and visualized portions of the superficial and profunda femoral arteries are patent without evidence of aneurysm, dissection, vasculitis or significant stenosis. Veins: Enlarged collateral veins are noted around the ostomy site as well as in in the anterior subcutaneous tissues of the abdomen. This suggest possible portal hypertension. Enlarged hepatic veins are also noted suggesting right ventricular failure. Review of the MIP images confirms the above findings. NON-VASCULAR Lower chest: Small right pleural effusion is  noted with minimal adjacent subsegmental atelectasis. Hepatobiliary: Cholelithiasis. No biliary dilatation is noted. Possible hepatic cirrhosis. Pancreas: Unremarkable. No pancreatic ductal dilatation or surrounding inflammatory changes. Spleen: Mild splenomegaly is noted. Adrenals/Urinary Tract: Adrenal glands are unremarkable. Kidneys are normal, without renal calculi, focal lesion, or hydronephrosis. Bladder is unremarkable. Stomach/Bowel: The stomach is unremarkable. Colostomy is noted in right lower quadrant. There is no evidence of bowel obstruction. There is the possibility of extravasation of contrast around the rectum. There is high density material seen within the lumen of the ostomy, but this may simply represent enhancing mucosa as opposed to bleeding. Lymphatic: No adenopathy. Reproductive: No definite abnormality seen. Other: Mild ascites is noted in the pelvis and around the liver and spleen. Musculoskeletal: No acute or significant osseous findings. IMPRESSION: Colostomy is noted in right lower quadrant. There is noted high density material seen within the lumen of the colostomy, but this may simply represent enhancing mucosa as opposed to active gastrointestinal bleeding. There are noted enlarged venous collaterals around the ostomy that also extend through the subcutaneous tissues of the anterior dominant wall suggesting portal hypertension. There is also noted possible extravasation of contrast in the rectum, but once again this also may simply represent enhancing mucosa. These results were called by telephone at the time of interpretation on 07/19/2024 at 4:46 pm to provider MICHAEL MIAN , who verbally acknowledged these results. Slightly nodular hepatic contours are noted suggesting hepatic cirrhosis. Mild ascites. Mild splenomegaly. Cholelithiasis. Small right pleural effusion with adjacent subsegmental atelectasis. Aortic Atherosclerosis (ICD10-I70.0). Electronically Signed   By: Lynwood Landy Raddle M.D.   On: 07/19/2024 16:47    Microbiology: Results for orders placed or performed during the hospital encounter of 06/07/24  Gastrointestinal Panel by PCR , Stool     Status: None   Collection Time: 06/08/24  3:37 PM   Specimen: Stool  Result Value Ref Range Status   Campylobacter species NOT DETECTED NOT DETECTED Final   Plesimonas shigelloides NOT DETECTED NOT DETECTED Final   Salmonella species NOT DETECTED NOT DETECTED Final   Yersinia enterocolitica NOT DETECTED NOT DETECTED Final   Vibrio species NOT DETECTED NOT DETECTED Final   Vibrio cholerae NOT DETECTED NOT DETECTED Final   Enteroaggregative E coli (EAEC) NOT DETECTED NOT DETECTED Final   Enteropathogenic E coli (EPEC) NOT DETECTED NOT DETECTED Final   Enterotoxigenic E coli (ETEC) NOT DETECTED NOT DETECTED Final   Shiga like toxin producing E coli (STEC) NOT DETECTED NOT DETECTED Final   Shigella/Enteroinvasive E coli (EIEC) NOT DETECTED NOT DETECTED Final   Cryptosporidium NOT DETECTED NOT DETECTED Final   Cyclospora cayetanensis NOT DETECTED NOT DETECTED Final   Entamoeba histolytica NOT DETECTED NOT DETECTED Final   Giardia lamblia NOT DETECTED NOT DETECTED Final   Adenovirus F40/41 NOT DETECTED NOT DETECTED Final   Astrovirus NOT DETECTED NOT DETECTED Final   Norovirus GI/GII NOT DETECTED NOT DETECTED Final   Rotavirus A NOT DETECTED NOT DETECTED Final   Sapovirus (I, II, IV, and V) NOT DETECTED NOT  DETECTED Final    Comment: Performed at [facility], [address] [place], [place] [address]    Labs: CBC: Recent Labs  Lab 07/19/24 1135 07/19/24 1332 07/19/24 2202 07/20/24 0458 07/20/24 1647 07/20/24 2303 07/21/24 0531 07/22/24 0519  WBC 4.3  --   --  2.4*  --   --  2.8* 2.3*  HGB 8.7*   < > 8.3* 7.1* 7.1* 8.4* 8.2* 8.8*  HCT 28.6*   < > 26.4* 22.6*  --  24.9* 25.2* 27.2*  MCV 107.1*  --   --  104.6*  --   --  97.7 97.8  PLT 115*  --   --  71*  --   --  80* 78*   < > = values in this  interval not displayed.   Basic Metabolic Panel: Recent Labs  Lab 07/19/24 1135 07/20/24 0458 07/21/24 0531 07/22/24 0519  NA 137 136 132* 135  K 4.4 3.8 3.9 4.6  CL 109 111 108 108  CO2 17* 19* 18* 18*  GLUCOSE 113* 68* 143* 126*  BUN 33* 27* 24* 19  CREATININE 1.20* 0.92 0.94 0.88  CALCIUM  9.3 8.8* 8.3* 8.8*   Liver Function Tests: Recent Labs  Lab 07/19/24 1135  AST 37  ALT 17  ALKPHOS 139*  BILITOT 0.8  PROT 7.3  ALBUMIN  4.1   CBG: Recent Labs  Lab 07/18/24 0737 07/20/24 1115 07/20/24 1215  GLUCAP 125* 66* 82    Discharge time spent: greater than 30 minutes.  Signed: Laree Lock, MD [facility] 07/22/2024

## 2024-07-22 NOTE — Discharge Instructions (Signed)
 Convex barrier Will add ostomy belt Adding barrier strips Update [employer] for supplies.

## 2024-07-22 NOTE — TOC Initial Note (Signed)
 Transition of Care [facility]) - Initial/Assessment Note    Patient Details  Name: Tammy Arias MRN: [id number] Date of Birth: May 26, 1956  Transition of Care [facility]) CM/SW Contact:    Racheal LITTIE Schimke, RN Phone Number: 07/22/2024, 11:30 AM  Clinical Narrative:  Transition of Care (TOC) Screening Note   Patient Details  Name: Tammy Arias Date of Birth: 1956-04-17   Transition of Care [facility]) CM/SW Contact:    Racheal LITTIE Schimke, RN Phone Number: 07/22/2024, 11:30 AM    Transition of Care Department [facility]) has reviewed patient and no TOC needs have been identified at this time. We will continue to monitor patient advancement through interdisciplinary progression rounds. If new patient transition needs arise, please place a TOC consult.                 Patient Goals and CMS Choice            Expected Discharge Plan and Services                                              Prior Living Arrangements/Services                       Activities of Daily Living   ADL Screening (condition at time of admission) Independently performs ADLs?: Yes (appropriate for developmental age) Is the patient deaf or have difficulty hearing?: No Does the patient have difficulty seeing, even when wearing glasses/contacts?: No Does the patient have difficulty concentrating, remembering, or making decisions?: No  Permission Sought/Granted                  Emotional Assessment              Admission diagnosis:  GI bleed [K92.2] Upper GI bleed [K92.2] AKI (acute kidney injury) (HCC) [N17.9] Patient Active Problem List   Diagnosis Date Noted   Bilateral inguinal hernia without obstruction or gangrene 07/22/2024   Deficient knowledge of ileostomy 07/22/2024   GI bleed 07/19/2024   Esophageal varices without bleeding (HCC) 06/11/2024   Blood in stool 06/10/2024   GI bleeding 06/07/2024   Acute blood loss anemia 06/07/2024   Irritant contact dermatitis associated  with fecal stoma 04/14/2024   Ileostomy care (HCC) 04/14/2024   Dehydration 04/14/2024   Hypokalemia 04/06/2024   Ileostomy in place (HCC) 04/04/2024   Status post total colectomy 04/04/2024   Chronic metabolic acidosis 04/04/2024   Enteritis due to Norovirus 04/04/2024   Elevated LFTs 04/04/2024   Glaucoma 04/04/2024   GERD without esophagitis 03/29/2024   Type 2 diabetes mellitus with chronic kidney disease, without long-term current use of insulin  (HCC) 03/29/2024   Dyslipidemia 03/29/2024   Chronic systolic CHF (congestive heart failure) (HCC) - LVEF 35 to 40% 03/17/2024   HTN (hypertension) 03/17/2024   Hypothyroidism 03/17/2024   Gout 03/17/2024   Hyponatremia 03/17/2024   Hypomagnesemia 03/17/2024   Acute kidney injury superimposed on stage 3a chronic kidney disease (HCC) - baseline scr 1.2 03/17/2024   Chronic diastolic CHF (congestive heart failure) (HCC) 06/07/2023   Chronic anticoagulation - on Eliquis  for chronic afib 06/07/2023   Cirrhosis and Portal hypertension  on CT(HCC) 06/07/2023   Thrombocytopenia (HCC) 06/06/2023   CAD with hx of CABG 06/06/2023   OSA on CPAP 05/23/2023   S/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, right 04/05/2022  S/P TKR (total knee replacement) 04/05/2022   Colon cancer screening    Iron  deficiency anemia    Gastric AVM    Barrett's esophagus without dysplasia    Angiodysplasia of intestinal tract    Internal hemorrhoids    Pulmonary hypertension (HCC) 03/29/2017   Coronary artery disease involving native coronary artery of native heart with unstable angina pectoris (HCC) 03/21/2017   Ischemic cardiomyopathy 03/21/2017   Atrial fibrillation, chronic (HCC) 10/08/2014   Hyperlipidemia 10/08/2014   Type 2 diabetes mellitus (HCC) 10/08/2014   PCP:  Valora Agent, MD Pharmacy:   [facility] [place] [place] - [address] [place] [place] [address] Phone: [phone] Fax: [phone]     Social Drivers of Health  (SDOH) Social History: SDOH Screenings   Food Insecurity: No Food Insecurity (07/19/2024)  Housing: Low Risk  (07/19/2024)  Transportation Needs: No Transportation Needs (07/19/2024)  Utilities: Not At Risk (07/19/2024)  Financial Resource Strain: Medium Risk (05/20/2024)   Received from [facility]  Social Connections: Moderately Integrated (07/19/2024)  Tobacco Use: Medium Risk (07/19/2024)   SDOH Interventions:     Readmission Risk Interventions    06/11/2024    4:23 PM 03/19/2024   10:52 AM 03/01/2024   12:07 PM  Readmission Risk Prevention Plan  Transportation Screening Complete Complete Complete  PCP or Specialist Appt within 3-5 Days  Complete Complete  HRI or Home Care Consult  Complete Complete  Social Work Consult for Recovery Care Planning/Counseling  Complete Complete  Palliative Care Screening  Not Applicable Not Applicable  Medication Review [employer]) Complete Complete Complete  HRI or Home Care Consult --    Palliative Care Screening Not Applicable    Skilled Nursing Facility Not Applicable

## 2024-07-23 LAB — BPAM RBC
Blood Product Expiration Date: 202509082359
Blood Product Expiration Date: 202509212359
Blood Product Expiration Date: 202509222359
ISSUE DATE / TIME: 202508231812
Unit Type and Rh: 600
Unit Type and Rh: 600
Unit Type and Rh: 600

## 2024-07-23 LAB — TYPE AND SCREEN
ABO/RH(D): A NEG
Antibody Screen: POSITIVE
Unit division: 0
Unit division: 0
Unit division: 0
Unit division: 0
Unit division: 0

## 2024-07-31 ENCOUNTER — Inpatient Hospital Stay: Attending: Oncology

## 2024-07-31 VITALS — BP 94/48 | HR 78 | Temp 98.6°F | Resp 17

## 2024-07-31 DIAGNOSIS — D500 Iron deficiency anemia secondary to blood loss (chronic): Secondary | ICD-10-CM

## 2024-07-31 DIAGNOSIS — D509 Iron deficiency anemia, unspecified: Secondary | ICD-10-CM | POA: Insufficient documentation

## 2024-07-31 MED ORDER — SODIUM CHLORIDE 0.9 % IV SOLN
INTRAVENOUS | Status: DC
  Filled 2024-07-31: qty 250

## 2024-07-31 MED ORDER — SODIUM CHLORIDE 0.9 % IV SOLN
510.0000 mg | Freq: Once | INTRAVENOUS | Status: AC
  Administered 2024-07-31: 510 mg via INTRAVENOUS
  Filled 2024-07-31: qty 510

## 2024-07-31 NOTE — Patient Instructions (Signed)
 [facility] - A DEPT OF [facility]  Discharge Instructions: Thank you for choosing [facility] to provide your oncology and hematology care.  If you have a lab appointment with the Cancer Center, please go directly to the Cancer Center and check in at the registration area.  Wear comfortable clothing and clothing appropriate for easy access to any Portacath or PICC line.   We strive to give you quality time with your provider. You may need to reschedule your appointment if you arrive late (15 or more minutes).  Arriving late affects you and other patients whose appointments are after yours.  Also, if you miss three or more appointments without notifying the office, you may be dismissed from the clinic at the provider's discretion.      For prescription refill requests, have your pharmacy contact our office and allow 72 hours for refills to be completed.    Today you received the following chemotherapy and/or immunotherapy agents feraheme.      To help prevent nausea and vomiting after your treatment, we encourage you to take your nausea medication as directed.  BELOW ARE SYMPTOMS THAT SHOULD BE REPORTED IMMEDIATELY: *FEVER GREATER THAN 100.4 F (38 C) OR HIGHER *CHILLS OR SWEATING *NAUSEA AND VOMITING THAT IS NOT CONTROLLED WITH YOUR NAUSEA MEDICATION *UNUSUAL SHORTNESS OF BREATH *UNUSUAL BRUISING OR BLEEDING *URINARY PROBLEMS (pain or burning when urinating, or frequent urination) *BOWEL PROBLEMS (unusual diarrhea, constipation, pain near the anus) TENDERNESS IN MOUTH AND THROAT WITH OR WITHOUT PRESENCE OF ULCERS (sore throat, sores in mouth, or a toothache) UNUSUAL RASH, SWELLING OR PAIN  UNUSUAL VAGINAL DISCHARGE OR ITCHING   Items with * indicate a potential emergency and should be followed up as soon as possible or go to the Emergency Department if any problems should occur.  Please show the CHEMOTHERAPY ALERT CARD or IMMUNOTHERAPY  ALERT CARD at check-in to the Emergency Department and triage nurse.  Should you have questions after your visit or need to cancel or reschedule your appointment, please contact [facility] - A DEPT OF [facility]  [phone] and follow the prompts.  Office hours are 8:00 a.m. to 4:30 p.m. Monday - Friday. Please note that voicemails left after 4:00 p.m. may not be returned until the following business day.  We are closed weekends and major holidays. You have access to a nurse at all times for urgent questions. Please call the main number to the clinic [phone] and follow the prompts.  For any non-urgent questions, you may also contact your provider using MyChart. We now offer e-Visits for anyone 27 and older to request care online for non-urgent symptoms. For details visit [email or web address].   Also download the MyChart app! Go to the app store, search "MyChart", open the app, select [place], and log in with your MyChart username and password.

## 2024-08-01 ENCOUNTER — Ambulatory Visit: Admit: 2024-08-01 | Admitting: Gastroenterology

## 2024-08-01 SURGERY — EGD (ESOPHAGOGASTRODUODENOSCOPY)
Anesthesia: General

## 2024-08-05 ENCOUNTER — Other Ambulatory Visit: Payer: Self-pay | Admitting: Specialist

## 2024-08-05 ENCOUNTER — Ambulatory Visit
Admission: RE | Admit: 2024-08-05 | Discharge: 2024-08-05 | Disposition: A | Discharge status: Home / Self Care | Source: Ambulatory Visit | Attending: Specialist | Admitting: Specialist

## 2024-08-05 ENCOUNTER — Ambulatory Visit: Admission: RE | Admit: 2024-08-05 | Source: Ambulatory Visit

## 2024-08-05 DIAGNOSIS — R600 Localized edema: Secondary | ICD-10-CM | POA: Diagnosis present

## 2024-08-05 DIAGNOSIS — R0602 Shortness of breath: Secondary | ICD-10-CM | POA: Diagnosis present

## 2024-08-08 ENCOUNTER — Inpatient Hospital Stay

## 2024-08-08 VITALS — BP 110/65 | HR 90 | Temp 97.2°F | Resp 19

## 2024-08-08 DIAGNOSIS — D509 Iron deficiency anemia, unspecified: Secondary | ICD-10-CM | POA: Diagnosis not present

## 2024-08-08 DIAGNOSIS — D500 Iron deficiency anemia secondary to blood loss (chronic): Secondary | ICD-10-CM

## 2024-08-08 MED ORDER — SODIUM CHLORIDE 0.9 % IV SOLN
510.0000 mg | Freq: Once | INTRAVENOUS | Status: AC
  Administered 2024-08-08: 510 mg via INTRAVENOUS
  Filled 2024-08-08: qty 510

## 2024-08-14 ENCOUNTER — Encounter: Payer: Self-pay | Admitting: Student in an Organized Health Care Education/Training Program

## 2024-08-14 ENCOUNTER — Other Ambulatory Visit: Payer: Self-pay

## 2024-08-14 ENCOUNTER — Emergency Department

## 2024-08-14 ENCOUNTER — Inpatient Hospital Stay
Admission: EM | Admit: 2024-08-14 | Discharge: 2024-08-22 | DRG: 432 | Disposition: A | Discharge status: Home / Self Care | Attending: Internal Medicine | Admitting: Internal Medicine

## 2024-08-14 DIAGNOSIS — N179 Acute kidney failure, unspecified: Secondary | ICD-10-CM | POA: Diagnosis present

## 2024-08-14 DIAGNOSIS — I5021 Acute systolic (congestive) heart failure: Secondary | ICD-10-CM

## 2024-08-14 DIAGNOSIS — I480 Paroxysmal atrial fibrillation: Secondary | ICD-10-CM | POA: Diagnosis present

## 2024-08-14 DIAGNOSIS — E872 Acidosis, unspecified: Secondary | ICD-10-CM | POA: Diagnosis present

## 2024-08-14 DIAGNOSIS — N1832 Chronic kidney disease, stage 3b: Secondary | ICD-10-CM | POA: Diagnosis present

## 2024-08-14 DIAGNOSIS — M810 Age-related osteoporosis without current pathological fracture: Secondary | ICD-10-CM | POA: Diagnosis present

## 2024-08-14 DIAGNOSIS — Z8249 Family history of ischemic heart disease and other diseases of the circulatory system: Secondary | ICD-10-CM

## 2024-08-14 DIAGNOSIS — A0471 Enterocolitis due to Clostridium difficile, recurrent: Secondary | ICD-10-CM | POA: Diagnosis present

## 2024-08-14 DIAGNOSIS — D539 Nutritional anemia, unspecified: Secondary | ICD-10-CM | POA: Diagnosis present

## 2024-08-14 DIAGNOSIS — I5082 Biventricular heart failure: Secondary | ICD-10-CM | POA: Diagnosis present

## 2024-08-14 DIAGNOSIS — E039 Hypothyroidism, unspecified: Secondary | ICD-10-CM | POA: Diagnosis present

## 2024-08-14 DIAGNOSIS — Z5986 Financial insecurity: Secondary | ICD-10-CM

## 2024-08-14 DIAGNOSIS — E1122 Type 2 diabetes mellitus with diabetic chronic kidney disease: Secondary | ICD-10-CM | POA: Diagnosis present

## 2024-08-14 DIAGNOSIS — E440 Moderate protein-calorie malnutrition: Secondary | ICD-10-CM | POA: Diagnosis present

## 2024-08-14 DIAGNOSIS — Z9049 Acquired absence of other specified parts of digestive tract: Secondary | ICD-10-CM

## 2024-08-14 DIAGNOSIS — G4733 Obstructive sleep apnea (adult) (pediatric): Secondary | ICD-10-CM | POA: Diagnosis present

## 2024-08-14 DIAGNOSIS — Z87891 Personal history of nicotine dependence: Secondary | ICD-10-CM | POA: Diagnosis not present

## 2024-08-14 DIAGNOSIS — I8510 Secondary esophageal varices without bleeding: Secondary | ICD-10-CM | POA: Diagnosis present

## 2024-08-14 DIAGNOSIS — D62 Acute posthemorrhagic anemia: Secondary | ICD-10-CM | POA: Diagnosis present

## 2024-08-14 DIAGNOSIS — R578 Other shock: Secondary | ICD-10-CM | POA: Diagnosis not present

## 2024-08-14 DIAGNOSIS — I5020 Unspecified systolic (congestive) heart failure: Secondary | ICD-10-CM

## 2024-08-14 DIAGNOSIS — K7460 Unspecified cirrhosis of liver: Secondary | ICD-10-CM

## 2024-08-14 DIAGNOSIS — D696 Thrombocytopenia, unspecified: Secondary | ICD-10-CM | POA: Diagnosis present

## 2024-08-14 DIAGNOSIS — I2510 Atherosclerotic heart disease of native coronary artery without angina pectoris: Secondary | ICD-10-CM | POA: Diagnosis present

## 2024-08-14 DIAGNOSIS — Z7984 Long term (current) use of oral hypoglycemic drugs: Secondary | ICD-10-CM

## 2024-08-14 DIAGNOSIS — Z932 Ileostomy status: Secondary | ICD-10-CM

## 2024-08-14 DIAGNOSIS — Z8049 Family history of malignant neoplasm of other genital organs: Secondary | ICD-10-CM

## 2024-08-14 DIAGNOSIS — A0472 Enterocolitis due to Clostridium difficile, not specified as recurrent: Secondary | ICD-10-CM | POA: Diagnosis not present

## 2024-08-14 DIAGNOSIS — I130 Hypertensive heart and chronic kidney disease with heart failure and stage 1 through stage 4 chronic kidney disease, or unspecified chronic kidney disease: Secondary | ICD-10-CM | POA: Diagnosis present

## 2024-08-14 DIAGNOSIS — K766 Portal hypertension: Secondary | ICD-10-CM | POA: Diagnosis present

## 2024-08-14 DIAGNOSIS — I959 Hypotension, unspecified: Secondary | ICD-10-CM

## 2024-08-14 DIAGNOSIS — Z961 Presence of intraocular lens: Secondary | ICD-10-CM | POA: Diagnosis present

## 2024-08-14 DIAGNOSIS — I4891 Unspecified atrial fibrillation: Secondary | ICD-10-CM | POA: Diagnosis not present

## 2024-08-14 DIAGNOSIS — Z8673 Personal history of transient ischemic attack (TIA), and cerebral infarction without residual deficits: Secondary | ICD-10-CM

## 2024-08-14 DIAGNOSIS — I272 Pulmonary hypertension, unspecified: Secondary | ICD-10-CM | POA: Diagnosis present

## 2024-08-14 DIAGNOSIS — Z95818 Presence of other cardiac implants and grafts: Secondary | ICD-10-CM

## 2024-08-14 DIAGNOSIS — K922 Gastrointestinal hemorrhage, unspecified: Secondary | ICD-10-CM | POA: Diagnosis not present

## 2024-08-14 DIAGNOSIS — K219 Gastro-esophageal reflux disease without esophagitis: Secondary | ICD-10-CM | POA: Diagnosis present

## 2024-08-14 DIAGNOSIS — Z8619 Personal history of other infectious and parasitic diseases: Secondary | ICD-10-CM

## 2024-08-14 DIAGNOSIS — Z7982 Long term (current) use of aspirin: Secondary | ICD-10-CM

## 2024-08-14 DIAGNOSIS — I5023 Acute on chronic systolic (congestive) heart failure: Secondary | ICD-10-CM | POA: Diagnosis present

## 2024-08-14 DIAGNOSIS — Z833 Family history of diabetes mellitus: Secondary | ICD-10-CM

## 2024-08-14 DIAGNOSIS — K31811 Angiodysplasia of stomach and duodenum with bleeding: Secondary | ICD-10-CM | POA: Diagnosis present

## 2024-08-14 DIAGNOSIS — Z96651 Presence of right artificial knee joint: Secondary | ICD-10-CM | POA: Diagnosis present

## 2024-08-14 DIAGNOSIS — Z7901 Long term (current) use of anticoagulants: Secondary | ICD-10-CM

## 2024-08-14 DIAGNOSIS — K7469 Other cirrhosis of liver: Secondary | ICD-10-CM | POA: Diagnosis present

## 2024-08-14 DIAGNOSIS — I255 Ischemic cardiomyopathy: Secondary | ICD-10-CM | POA: Diagnosis present

## 2024-08-14 DIAGNOSIS — E785 Hyperlipidemia, unspecified: Secondary | ICD-10-CM | POA: Diagnosis present

## 2024-08-14 DIAGNOSIS — Z801 Family history of malignant neoplasm of trachea, bronchus and lung: Secondary | ICD-10-CM

## 2024-08-14 DIAGNOSIS — Z803 Family history of malignant neoplasm of breast: Secondary | ICD-10-CM

## 2024-08-14 DIAGNOSIS — Z79890 Hormone replacement therapy (postmenopausal): Secondary | ICD-10-CM

## 2024-08-14 DIAGNOSIS — K760 Fatty (change of) liver, not elsewhere classified: Secondary | ICD-10-CM | POA: Diagnosis present

## 2024-08-14 DIAGNOSIS — K3189 Other diseases of stomach and duodenum: Secondary | ICD-10-CM | POA: Diagnosis present

## 2024-08-14 DIAGNOSIS — Z79899 Other long term (current) drug therapy: Secondary | ICD-10-CM

## 2024-08-14 DIAGNOSIS — I340 Nonrheumatic mitral (valve) insufficiency: Secondary | ICD-10-CM

## 2024-08-14 DIAGNOSIS — K589 Irritable bowel syndrome without diarrhea: Secondary | ICD-10-CM | POA: Diagnosis present

## 2024-08-14 LAB — GLUCOSE, CAPILLARY: Glucose-Capillary: 154 mg/dL — ABNORMAL HIGH (ref 70–99)

## 2024-08-14 LAB — COMPREHENSIVE METABOLIC PANEL WITH GFR
ALT: 16 U/L (ref 0–44)
AST: 34 U/L (ref 15–41)
Albumin: 3.2 g/dL — ABNORMAL LOW (ref 3.5–5.0)
Alkaline Phosphatase: 98 U/L (ref 38–126)
Anion gap: 10 (ref 5–15)
BUN: 31 mg/dL — ABNORMAL HIGH (ref 8–23)
CO2: 22 mmol/L (ref 22–32)
Calcium: 8.6 mg/dL — ABNORMAL LOW (ref 8.9–10.3)
Chloride: 102 mmol/L (ref 98–111)
Creatinine, Ser: 1.68 mg/dL — ABNORMAL HIGH (ref 0.44–1.00)
GFR, Estimated: 33 mL/min — ABNORMAL LOW (ref >60)
Glucose, Bld: 110 mg/dL — ABNORMAL HIGH (ref 70–99)
Potassium: 3.6 mmol/L (ref 3.5–5.1)
Sodium: 134 mmol/L — ABNORMAL LOW (ref 135–145)
Total Bilirubin: 1 mg/dL (ref 0.0–1.2)
Total Protein: 5.9 g/dL — ABNORMAL LOW (ref 6.5–8.1)

## 2024-08-14 LAB — HEMOGLOBIN AND HEMATOCRIT, BLOOD
HCT: 23.9 % — ABNORMAL LOW (ref 36.0–46.0)
Hemoglobin: 7.9 g/dL — ABNORMAL LOW (ref 12.0–15.0)

## 2024-08-14 LAB — CBC WITH DIFFERENTIAL/PLATELET
Abs Immature Granulocytes: 0.04 K/uL (ref 0.00–0.07)
Basophils Absolute: 0 K/uL (ref 0.0–0.1)
Basophils Relative: 1 %
Eosinophils Absolute: 1 K/uL — ABNORMAL HIGH (ref 0.0–0.5)
Eosinophils Relative: 18 %
HCT: 24.9 % — ABNORMAL LOW (ref 36.0–46.0)
Hemoglobin: 8 g/dL — ABNORMAL LOW (ref 12.0–15.0)
Immature Granulocytes: 1 %
Lymphocytes Relative: 10 %
Lymphs Abs: 0.5 K/uL — ABNORMAL LOW (ref 0.7–4.0)
MCH: 33.3 pg (ref 26.0–34.0)
MCHC: 32.1 g/dL (ref 30.0–36.0)
MCV: 103.8 fL — ABNORMAL HIGH (ref 80.0–100.0)
Monocytes Absolute: 0.6 K/uL (ref 0.1–1.0)
Monocytes Relative: 11 %
Neutro Abs: 3.2 K/uL (ref 1.7–7.7)
Neutrophils Relative %: 59 %
Platelets: 95 K/uL — ABNORMAL LOW (ref 150–400)
RBC: 2.4 MIL/uL — ABNORMAL LOW (ref 3.87–5.11)
RDW: 19.1 % — ABNORMAL HIGH (ref 11.5–15.5)
WBC: 5.3 K/uL (ref 4.0–10.5)
nRBC: 0 % (ref 0.0–0.2)

## 2024-08-14 LAB — CBC
HCT: 27.5 % — ABNORMAL LOW (ref 36.0–46.0)
Hemoglobin: 8.9 g/dL — ABNORMAL LOW (ref 12.0–15.0)
MCH: 31.2 pg (ref 26.0–34.0)
MCHC: 32.4 g/dL (ref 30.0–36.0)
MCV: 96.5 fL (ref 80.0–100.0)
Platelets: 113 K/uL — ABNORMAL LOW (ref 150–400)
RBC: 2.85 MIL/uL — ABNORMAL LOW (ref 3.87–5.11)
RDW: 19.2 % — ABNORMAL HIGH (ref 11.5–15.5)
WBC: 7.4 K/uL (ref 4.0–10.5)
nRBC: 0.3 % — ABNORMAL HIGH (ref 0.0–0.2)

## 2024-08-14 LAB — PREPARE RBC (CROSSMATCH)

## 2024-08-14 LAB — PROTIME-INR
INR: 1.3 — ABNORMAL HIGH (ref 0.8–1.2)
Prothrombin Time: 16.9 s — ABNORMAL HIGH (ref 11.4–15.2)

## 2024-08-14 LAB — MRSA NEXT GEN BY PCR, NASAL: MRSA by PCR Next Gen: DETECTED — AB

## 2024-08-14 LAB — APTT: aPTT: 32 s (ref 24–36)

## 2024-08-14 MED ORDER — POTASSIUM CHLORIDE 10 MEQ/100ML IV SOLN
10.0000 meq | INTRAVENOUS | Status: AC
  Administered 2024-08-14 – 2024-08-15 (×4): 10 meq via INTRAVENOUS
  Filled 2024-08-14 (×4): qty 100

## 2024-08-14 MED ORDER — SODIUM CHLORIDE 0.9 % IV BOLUS: 1000.0000 mL | Freq: Once | INTRAVENOUS | Status: DC

## 2024-08-14 MED ORDER — SODIUM CHLORIDE 0.9% IV SOLUTION
Freq: Once | INTRAVENOUS | Status: AC
Start: 2024-08-14 — End: 2024-08-14
  Filled 2024-08-14: qty 250

## 2024-08-14 MED ORDER — MUPIROCIN 2 % EX OINT
1.0000 | TOPICAL_OINTMENT | Freq: Two times a day (BID) | CUTANEOUS | Status: AC
  Administered 2024-08-15 – 2024-08-19 (×10): 1 via NASAL
  Filled 2024-08-14: qty 22

## 2024-08-14 MED ORDER — IOHEXOL 350 MG/ML SOLN
80.0000 mL | Freq: Once | INTRAVENOUS | Status: AC | PRN
  Administered 2024-08-14: 80 mL via INTRAVENOUS

## 2024-08-14 MED ORDER — SODIUM CHLORIDE 0.9 % IV SOLN: INTRAVENOUS | Status: DC

## 2024-08-14 MED ORDER — PANTOPRAZOLE SODIUM 40 MG IV SOLR
40.0000 mg | Freq: Once | INTRAVENOUS | Status: AC
  Administered 2024-08-14: 40 mg via INTRAVENOUS
  Filled 2024-08-14: qty 10

## 2024-08-14 MED ORDER — NOREPINEPHRINE 4 MG/250ML-% IV SOLN
0.0000 ug/min | INTRAVENOUS | Status: DC
  Administered 2024-08-14: 2 ug/min via INTRAVENOUS
  Administered 2024-08-15 (×2): 8 ug/min via INTRAVENOUS
  Administered 2024-08-15: 9 ug/min via INTRAVENOUS
  Administered 2024-08-16: 4 ug/min via INTRAVENOUS
  Administered 2024-08-16: 9 ug/min via INTRAVENOUS
  Filled 2024-08-14 (×6): qty 250

## 2024-08-14 MED ORDER — DOCUSATE SODIUM 100 MG PO CAPS: 100.0000 mg | ORAL_CAPSULE | Freq: Two times a day (BID) | ORAL | Status: DC | PRN

## 2024-08-14 MED ORDER — SODIUM CHLORIDE 0.9 % IV BOLUS
500.0000 mL | Freq: Once | INTRAVENOUS | Status: AC
  Administered 2024-08-14: 500 mL via INTRAVENOUS

## 2024-08-14 MED ORDER — PANTOPRAZOLE SODIUM 40 MG IV SOLR
40.0000 mg | Freq: Two times a day (BID) | INTRAVENOUS | Status: DC
  Administered 2024-08-14 – 2024-08-17 (×7): 40 mg via INTRAVENOUS
  Filled 2024-08-14 (×7): qty 10

## 2024-08-14 MED ORDER — OCTREOTIDE LOAD VIA INFUSION
50.0000 ug | Freq: Once | INTRAVENOUS | Status: AC
Start: 2024-08-14 — End: 2024-08-14
  Administered 2024-08-14: 50 ug via INTRAVENOUS
  Filled 2024-08-14: qty 25

## 2024-08-14 MED ORDER — SODIUM CHLORIDE 0.9 % IV SOLN
50.0000 ug/h | INTRAVENOUS | Status: DC
  Administered 2024-08-14 – 2024-08-15 (×2): 50 ug/h via INTRAVENOUS
  Filled 2024-08-14 (×4): qty 1

## 2024-08-14 MED ORDER — LATANOPROST 0.005 % OP SOLN
1.0000 [drp] | Freq: Every day | OPHTHALMIC | Status: DC
  Administered 2024-08-14 – 2024-08-21 (×8): 1 [drp] via OPHTHALMIC
  Filled 2024-08-14: qty 2.5

## 2024-08-14 MED ORDER — POLYETHYLENE GLYCOL 3350 17 G PO PACK: 17.0000 g | PACK | Freq: Every day | ORAL | Status: DC | PRN

## 2024-08-14 MED ORDER — SODIUM CHLORIDE 0.9 % IV SOLN
250.0000 mL | INTRAVENOUS | Status: DC
Start: 2024-08-14 — End: 2024-08-15
  Administered 2024-08-15: 250 mL via INTRAVENOUS

## 2024-08-14 MED ORDER — CHLORHEXIDINE GLUCONATE CLOTH 2 % EX PADS
6.0000 | MEDICATED_PAD | Freq: Every day | CUTANEOUS | Status: DC
  Administered 2024-08-14 – 2024-08-21 (×7): 6 via TOPICAL
  Filled 2024-08-14: qty 6

## 2024-08-14 MED ORDER — SODIUM CHLORIDE 0.9 % IV SOLN
Freq: Once | INTRAVENOUS | Status: AC
Start: 2024-08-14 — End: 2024-08-14

## 2024-08-14 MED ORDER — SODIUM CHLORIDE 0.9% IV SOLUTION: Freq: Once | INTRAVENOUS | Status: DC

## 2024-08-14 NOTE — ED Provider Notes (Signed)
 CT Angio Abd/Pel W and/or Wo Contrast Result Date: 08/14/2024 CLINICAL DATA:  Lower GI bleed. EXAM: CTA ABDOMEN AND PELVIS WITHOUT AND WITH CONTRAST TECHNIQUE: Multidetector CT imaging of the abdomen and pelvis was performed using the standard protocol during bolus administration of intravenous contrast. Multiplanar reconstructed images and MIPs were obtained and reviewed to evaluate the vascular anatomy. RADIATION DOSE REDUCTION: This exam was performed according to the departmental dose-optimization program which includes automated exposure control, adjustment of the mA and/or kV according to patient size and/or use of iterative reconstruction technique. CONTRAST:  80mL OMNIPAQUE  IOHEXOL  350 MG/ML SOLN COMPARISON:  CT dated 07/19/2024. FINDINGS: VASCULAR Aorta: Moderate atherosclerotic calcification of the thoracic aorta. No adrenal dilatation or dissection. No periaortic fluid collection. Celiac: Atherosclerotic calcification of the origin of the celiac trunk. The celiac artery is patent. SMA: Atherosclerotic calcification of the origin of the SMA. The SMA is patent. Renals: Atherosclerotic calcification of the renal artery ostia. There is luminal narrowing with diminished flow in the renal arteries. IMA: The origin of the IMA is not visualized. There is reconstitution of flow within the IMA. Inflow: Mild atherosclerotic calcification of the iliac arteries. No aneurysmal dilatation or dissection. The iliac arteries are patent. Proximal Outflow: The visualized proximal of fluid patent. Veins: The IVC is unremarkable. The SMV, splenic vein, and main portal vein are patent. No portal venous gas. Review of the MIP images confirms the above findings. NON-VASCULAR Lower chest: Trace right pleural effusion. The visualized lung bases are clear. Mild cardiomegaly. No intra-abdominal free air.  Small ascites. Hepatobiliary: Cirrhosis. Multiple gallstones. The gallbladder is contracted. Pancreas: Unremarkable. No  pancreatic ductal dilatation or surrounding inflammatory changes. Spleen: Normal in size without focal abnormality. Adrenals/Urinary Tract: The adrenal glands unremarkable. There is no hydronephrosis or nephrolithiasis on either side. The visualized ureters and urinary bladder appear unremarkable. 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 Small parastomal hernia. No evidence of bowel obstruction. Residual oral contrast noted in the stomach. There is diffuse enhancement of the small bowel mucosa. No evidence of active GI bleed. Appendectomy. Lymphatic: No adenopathy. Reproductive: No suspicious pelvic mass. Other: Midline vertical anterior pelvic wall incisional scar and subcutaneous edema. No fluid collection. Musculoskeletal: Degenerative changes of the spine and scoliosis. No acute osseous pathology. IMPRESSION: 1. No evidence of active GI bleed. 2. 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 No evidence of bowel obstruction. 3. Cirrhosis with small ascites. 4. Cholelithiasis. 5.  Aortic Atherosclerosis (ICD10-I70.0). Electronically Signed   By: Vanetta Chou M.D.   On: 08/14/2024 16:25   No active bleeding noted on bleeding CTA.  Dr. Edith advises transfuse as needed, symptomatic management and medications as they have already been ordered.  No acute intervention planned at this time.  No obvious indication for embolization or vascular surgery involvement noted.  Consulted Dr. Isadora for ICU team evaluation for possible ICU level admission vs hospitalist. Awaiting plan from CCM   ----------------------------------------- 7:27 PM on 08/14/2024 ----------------------------------------- Patient admitted to ICU service.   Dicky Anes, MD 08/14/24 [phone]

## 2024-08-14 NOTE — Consult Note (Signed)
 Tammy Copping, MD [facility]  [address], [address] [place], [place] [address] Phone: [phone] Fax : [phone]  Consultation  Referring Provider:     Dr. Ernest Primary Care Physician:  Valora Agent, MD Primary Gastroenterologist: [name]         Reason for Consultation:     GI bleeding  Date of Admission:  08/14/2024 Date of Consultation:  08/14/2024         HPI:   Tammy Arias is a 68 y.o. female who was recently in the hospital for GI bleed at the end of August and was seen by Dr. Therisa.  The patient has a history of coronary artery disease, cryptogenic cirrhosis with esophageal varices and a history of ischemic cardiomyopathy with a history of GI bleed in the past.  The patient had a colonoscopy in January 2025 for rectal bleeding with multiple AVMs found.  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 Onita with gastric antral vascular ectasia without any bleeding with grade 2 esophageal varices and portal hypertensive gastropathy.  The patient had a capsule endoscopy done on 24 August that did not show any source of the bleeding and the capsule passed all the way through to the ostomy bag.  Last July the patient had an upper endoscopy by me at that time that also showed the portal hypertensive gastropathy with esophageal varices. The patient had been taking Plavix but stopped it on September 9.  Her hemoglobin prior to admission was 9.2 and has needed blood transfusions in the past.  The patient now reports that she has been having bleeding from her ostomy with intermittent bleeding since she left the hospital but it became much more of an issue today. The patient was noted to have hypotension and on admission her hemoglobin was 8.0 with a repeat 7 hours later of 7.9.  The patient had imaging with a CT angiography showing:  IMPRESSION: 1. No evidence of active GI bleed. 2. Postsurgical changes of the bowel with a right lower  quadrant ileostomy. No evidence of bowel obstruction. 3. Cirrhosis with small ascites. 4. Cholelithiasis. 5.  Aortic Atherosclerosis  Due to the hypotension and GI bleeding the patient was admitted to the ICU and a GI consult was called.  The patient's previous CT angiography's done on August 07, 2023 and again on August 22 of this year did not show any sign of active bleeding but the August CT angiography showed enlarged venous collections around the ostomy that extend through the subcutaneous tissue of the anterior abdominal wall suggesting portal hypertension.  Past Medical History:  Diagnosis Date   AC (acromioclavicular) joint bone spurs, right    Anemia    Arthritis    Atrial fibrillation (HCC)    a.) CHA2DS2-VASc = 7 (age, sex, HFrEF, HTN, TIA x 2, T2DM). b.) rate/rhythm controlled on oral carvedilol ; chronically anticoagulated with warfarin.   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 b.) R/LHC 03/29/2017: small RCA with occluded SVG; no significant Dz. Chronically occluded LAD with patent SVG to D1/LAD. Insignificant Dz in LCx. LM normal.   Cortical cataract    DOE (dyspnea on exertion)    DOE (dyspnea  on exertion)    GERD (gastroesophageal reflux disease)    HFrEF (heart failure with reduced ejection fraction) (HCC)    a.) TTE 10/27/2014: EF 40%; glob HK; mild BAE; triv AR, mild MR/PR, mod TR.  b.) TTE 03/15/2017: EF 30%; glob HK; mod BAE; mod pHTN (RVSP 54.8 mmHg); mild PR, mod MR; sev TR.  c.) R/LHC 03/29/2017: EF 30-35%. d.)TTE 10/25/2018: EF 35%; mod BAE; mod BVE; glob HK; triv PR, mod MR, sev TR; RVSP 57.7 mmHg; G2DD.   History of shingles 2004   Hyperlipidemia    Hypertension    Hypothyroidism    IBS (irritable bowel syndrome)    Ischemic cardiomyopathy    a.) TTE  10/27/2014: EF 40%. b.) TTE 03/15/2017: EF 30%. c.) R/LHC 04/01/2017: EF 30-35%. d.) TTE 10/25/2018: EF 35%   Lower GI bleed 06/07/2023   Lumbar scoliosis    Lumbar spinal stenosis    Melena 12/17/2018   Migraines    Osteoporosis    Pancolitis (HCC) 02/28/2024   Pulmonary HTN (HCC)    a.) TTE 03/15/2017: EF 30-35%; RVSP 54.8 mmHg. 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   Sleep apnea    T2DM (type 2 diabetes mellitus) (HCC)    TIA (transient ischemic attack) 05/29/2016   Vitamin B 12 deficiency     Past Surgical History:  Procedure Laterality Date   CARDIAC CATHETERIZATION     CATARACT EXTRACTION W/ INTRAOCULAR LENS IMPLANT Bilateral    Cataract Extraction with IOL   COLECTOMY  02/28/2024   Procedure: COLECTOMY, TOTAL;  Surgeon: Lane Shope, MD;  Location: [facility] ORS;  Service: General;;   COLONOSCOPY N/A 12/19/2018   Procedure: COLONOSCOPY;  Surgeon: Janalyn Keene NOVAK, MD;  Location: [facility] ENDOSCOPY;  Service: Endoscopy;  Laterality: N/A;   COLONOSCOPY WITH PROPOFOL  N/A 06/17/2020   Procedure: COLONOSCOPY WITH PROPOFOL ;  Surgeon: Janalyn Keene NOVAK, MD;  Location: [facility] ENDOSCOPY;  Service: Endoscopy;  Laterality: N/A;   COLONOSCOPY WITH PROPOFOL  N/A 03/30/2023   Procedure: COLONOSCOPY WITH PROPOFOL ;  Surgeon: Onita Elspeth Sharper, DO;  Location: [facility] ENDOSCOPY;  Service: Endoscopy;  Laterality: N/A;   COR TRIATRIATUM REPAIR N/A 01/28/2004   CORONARY ARTERY BYPASS GRAFT N/A 01/28/2004   3v CABG   ESOPHAGOGASTRODUODENOSCOPY N/A 12/19/2018   Procedure: ESOPHAGOGASTRODUODENOSCOPY (EGD);  Surgeon: Janalyn Keene NOVAK, MD;  Location: [facility]COPY;  Service: Endoscopy;  Laterality: N/A;   ESOPHAGOGASTRODUODENOSCOPY N/A 06/11/2024   Procedure: EGD (ESOPHAGOGASTRODUODENOSCOPY);  Surgeon: Jinny Carmine, MD;  Location: [facility] ENDOSCOPY;  Service: Endoscopy;  Laterality: N/A;    ESOPHAGOGASTRODUODENOSCOPY N/A 07/18/2024   Procedure: EGD (ESOPHAGOGASTRODUODENOSCOPY);  Surgeon: Onita Elspeth Sharper, DO;  Location: [facility] ENDOSCOPY;  Service: Gastroenterology;  Laterality: N/A;  with Enteroscopy        DM on Plavix   ESOPHAGOGASTRODUODENOSCOPY (EGD) WITH PROPOFOL  N/A 06/17/2020   Procedure: ESOPHAGOGASTRODUODENOSCOPY (EGD) WITH PROPOFOL ;  Surgeon: Janalyn Keene NOVAK, MD;  Location: [facility] ENDOSCOPY;  Service: Endoscopy;  Laterality: N/A;   ESOPHAGOGASTRODUODENOSCOPY (EGD) WITH PROPOFOL  N/A 03/30/2023   Procedure: ESOPHAGOGASTRODUODENOSCOPY (EGD) WITH PROPOFOL ;  Surgeon: Onita Elspeth Sharper, DO;  Location: [facility] ENDOSCOPY;  Service: Endoscopy;  Laterality: N/A;   GIVENS CAPSULE STUDY N/A 07/21/2024   Procedure: IMAGING PROCEDURE, GI TRACT, INTRALUMINAL, VIA CAPSULE;  Surgeon: Therisa Bi, MD;  Location: [facility] ENDOSCOPY;  Service: Gastroenterology;  Laterality: N/A;   LAPAROTOMY N/A 02/28/2024   Procedure: LAPAROTOMY, EXPLORATORY;  Surgeon: Lane,  Honor, MD;  Location: [facility] ORS;  Service: General;  Laterality: N/A;   LEFT ATRIAL APPENDAGE OCCLUSION     RIGHT/LEFT HEART CATH AND CORONARY ANGIOGRAPHY Bilateral 03/29/2017   Procedure: Right/Left Heart Cath and Coronary Angiography;  Surgeon: Vinie DELENA Jude, MD;  Location: [facility] INVASIVE CV LAB;  Service: Cardiovascular;  Laterality: Bilateral;   TOTAL KNEE ARTHROPLASTY Right 04/05/2022   Procedure: TOTAL KNEE ARTHROPLASTY;  Surgeon: Kathlynn Sharper, MD;  Location: [facility] ORS;  Service: Orthopedics;  Laterality: Right;   TUBAL LIGATION     VENTRAL HERNIA REPAIR N/A 04/21/2017   Procedure: HERNIA REPAIR VENTRAL ADULT;  Surgeon: Claudene Larinda Bolder, MD;  Location: [facility] ORS;  Service: General;  Laterality: N/A;    Prior to Admission medications   Medication Sig Start Date End Date Taking? Authorizing Provider  albuterol  (PROVENTIL  HFA;VENTOLIN  HFA) 108 (90 Base) MCG/ACT inhaler Inhale 2 puffs into the lungs every 6 (six) hours as  needed for wheezing or shortness of breath.   Yes [provider]  allopurinol  (ZYLOPRIM ) 300 MG tablet Take 300 mg by mouth daily.   Yes [provider]  aspirin  EC 81 MG tablet Take 81 mg by mouth daily. Swallow whole.   Yes [provider]  Blood Glucose Monitoring Suppl (ACCU-CHEK GUIDE ME) w/Device KIT  08/01/24  Yes [provider]  cyanocobalamin  (VITAMIN B12) 1000 MCG tablet Take 1,000 mcg by mouth daily.   Yes [provider]  empagliflozin  (JARDIANCE ) 10 MG TABS tablet Take 1 tablet by mouth daily. 04/17/24  Yes [provider]  fluticasone  (FLONASE ) 50 MCG/ACT nasal spray Place 2 sprays into the nose daily. 05/20/24  Yes [provider]  iron  polysaccharides (NIFEREX) 150 MG capsule Take 300 mg by mouth.  Take 2 capsules (300 mg total) by mouth once daily for 90 days 06/20/24 09/18/24 Yes [provider]  latanoprost  (XALATAN ) 0.005 % ophthalmic solution Place 1 drop into both eyes at bedtime.   Yes [provider]  levothyroxine  (SYNTHROID ) 50 MCG tablet Take 50 mcg by mouth daily before breakfast.   Yes [provider]  magnesium  oxide (MAG-OX) 400 MG tablet Take 400 mg by mouth. Take 1 tablet (400 mg total) by mouth once daily 05/20/24 05/20/25 Yes [provider]  metFORMIN  (GLUCOPHAGE ) 1000 MG tablet Take 1,000 mg by mouth 2 (two) times daily with a meal.   Yes [provider]  nadolol (CORGARD) 20 MG tablet Take 20 mg by mouth daily. 08/12/24 08/12/25 Yes [provider]  pantoprazole  (PROTONIX ) 40 MG tablet Take 40 mg by mouth daily. 01/28/21  Yes [provider]  rosuvastatin  (CRESTOR ) 5 MG tablet Take 5 mg by mouth daily. 02/20/24 02/19/25 Yes [provider]  spironolactone  (ALDACTONE ) 25 MG tablet Take 0.5 tablets (12.5 mg total) by mouth daily. 03/31/24  Yes Fausto Sor A, DO  torsemide  (DEMADEX ) 20 MG tablet Take 1 tablet (20 mg total) by mouth daily as  needed (for weight gain or swelling). 03/31/24  Yes Fausto Sor A, DO  carvedilol  (COREG ) 3.125 MG tablet Take 3.125 mg by mouth 2 (two) times daily with a meal.    [provider]  clopidogrel (PLAVIX) 75 MG tablet Take 75 mg by mouth. Take 1 tablet (75 mg total) by mouth once daily for 182 days Patient not taking: Reported on 08/14/2024 05/29/24 11/27/24  [provider]  loperamide  (IMODIUM  A-D) 2 MG tablet Take 2 tablets (4 mg total) by mouth 4 (four) times daily as needed for  diarrhea or loose stools. Patient not taking: Reported on 06/07/2024 04/04/24   Lane Shope, MD    Family History  Problem Relation Age of Onset   Valvular heart disease Mother    Diabetes Father    Cancer Sister        lung   Cancer Sister        cervical   Breast cancer Cousin      Social History   Tobacco Use   Smoking status: Former    Current packs/day: 0.00    Types: Cigarettes    Quit date: 03/30/1999    Years since quitting: 25.3   Smokeless tobacco: Never  Vaping Use   Vaping status: Never Used  Substance Use Topics   Alcohol use: No   Drug use: No    Allergies as of 08/14/2024 - Review Complete 08/14/2024  Allergen Reaction Noted   Vioxx [rofecoxib] Swelling 03/24/2017    Review of Systems:    All systems reviewed and negative except where noted in HPI.   Physical Exam:  Vital signs in last 24 hours: Temp:  [97.6 F (36.4 C)-98.3 F (36.8 C)] 97.7 F (36.5 C) (09/17 2105) Pulse Rate:  [60-135] 67 (09/17 2105) Resp:  [12-28] 14 (09/17 2105) BP: (68-95)/(38-69) 80/47 (09/17 2030) SpO2:  [98 %-100 %] 98 % (09/17 2105) Weight:  [57.2 kg-58.1 kg] 58.1 kg (09/17 2006)   General:   Pleasant, cooperative in NAD Head:  Normocephalic and atraumatic. Eyes:   No icterus.   Conjunctiva pink. PERRLA. Ears:  Normal auditory acuity. Neck:  Supple; no masses or thyroidomegaly Lungs: Respirations even and unlabored. Lungs clear to auscultation bilaterally.   No wheezes,  crackles, or rhonchi.  Heart:  Regular rate and rhythm;  Without murmur, clicks, rubs or gallops Abdomen:  Soft, nondistended, nontender. Normal bowel sounds. No appreciable masses or hepatomegaly.  No rebound or guarding.  Rectal:  Not performed. Msk:  Symmetrical without gross deformities.    Extremities:  Without edema, cyanosis or clubbing. Neurologic:  Alert and oriented x3;  grossly normal neurologically. Skin:  Intact without significant lesions or rashes. Cervical Nodes:  No significant cervical adenopathy. Psych:  Alert and cooperative. Normal affect.  LAB RESULTS: Recent Labs    08/14/24 1356  WBC 5.3  HGB 8.0*  HCT 24.9*  PLT 95*   BMET Recent Labs    08/14/24 1356  NA 134*  K 3.6  CL 102  CO2 22  GLUCOSE 110*  BUN 31*  CREATININE 1.68*  CALCIUM  8.6*   LFT Recent Labs    08/14/24 1356  PROT 5.9*  ALBUMIN  3.2*  AST 34  ALT 16  ALKPHOS 98  BILITOT 1.0   PT/INR Recent Labs    08/14/24 1356  LABPROT 16.9*  INR 1.3*    STUDIES: CT Angio Abd/Pel W and/or Wo Contrast Result Date: 08/14/2024 CLINICAL DATA:  Lower GI bleed. EXAM: CTA ABDOMEN AND PELVIS WITHOUT AND WITH CONTRAST TECHNIQUE: Multidetector CT imaging of the abdomen and pelvis was performed using the standard protocol during bolus administration of intravenous contrast. Multiplanar reconstructed images and MIPs were obtained and reviewed to evaluate the vascular anatomy. RADIATION DOSE REDUCTION: This exam was performed according to the departmental dose-optimization program which includes automated exposure control, adjustment of the mA and/or kV according to patient size and/or use of iterative reconstruction technique. CONTRAST:  80mL OMNIPAQUE  IOHEXOL  350 MG/ML SOLN COMPARISON:  CT dated 07/19/2024. FINDINGS: VASCULAR Aorta: Moderate atherosclerotic calcification of the thoracic aorta. No adrenal  dilatation or dissection. No periaortic fluid collection. Celiac: Atherosclerotic calcification of  the origin of the celiac trunk. The celiac artery is patent. SMA: Atherosclerotic calcification of the origin of the SMA. The SMA is patent. Renals: Atherosclerotic calcification of the renal artery ostia. There is luminal narrowing with diminished flow in the renal arteries. IMA: The origin of the IMA is not visualized. There is reconstitution of flow within the IMA. Inflow: Mild atherosclerotic calcification of the iliac arteries. No aneurysmal dilatation or dissection. The iliac arteries are patent. Proximal Outflow: The visualized proximal of fluid patent. Veins: The IVC is unremarkable. The SMV, splenic vein, and main portal vein are patent. No portal venous gas. Review of the MIP images confirms the above findings. NON-VASCULAR Lower chest: Trace right pleural effusion. The visualized lung bases are clear. Mild cardiomegaly. No intra-abdominal free air.  Small ascites. Hepatobiliary: Cirrhosis. Multiple gallstones. The gallbladder is contracted. Pancreas: Unremarkable. No pancreatic ductal dilatation or surrounding inflammatory changes. Spleen: Normal in size without focal abnormality. Adrenals/Urinary Tract: The adrenal glands unremarkable. There is no hydronephrosis or nephrolithiasis on either side. The visualized ureters and urinary bladder appear unremarkable. 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 Small parastomal hernia. No evidence of bowel obstruction. Residual oral contrast noted in the stomach. There is diffuse enhancement of the small bowel mucosa. No evidence of active GI bleed. Appendectomy. Lymphatic: No adenopathy. Reproductive: No suspicious pelvic mass. Other: Midline vertical anterior pelvic wall incisional scar and subcutaneous edema. No fluid collection. Musculoskeletal: Degenerative changes of the spine and scoliosis. No acute osseous pathology. IMPRESSION: 1. No evidence of active GI bleed. 2. Postsurgical changes of the bowel with a right  lower quadrant ileostomy. No evidence of bowel obstruction. 3. Cirrhosis with small ascites. 4. Cholelithiasis. 5.  Aortic Atherosclerosis (ICD10-I70.0). Electronically Signed   By: Vanetta Chou M.D.   On: 08/14/2024 16:25      Impression / Plan:   Assessment: Principal Problem:   Hemorrhagic shock (HCC)   Tammy Arias is a 68 y.o. y/o female with with a history of cirrhosis and varices with portal hypertensive gastropathy and multiple CT angiography's in the past with an upper endoscopy and a push enteroscopy without a source of the GI bleeding being seen.  The patient also had a capsule endoscopy that did not show any source of the bleeding.  The patient now comes in with hypotension bleeding from her ostomy bag and a repeat CT angiography that was negative.  Plan:  The patient has been admitted to the ICU and should be resuscitated appropriately with blood transfusions and due to the CT angiography being negative and a negative workup in the past the source of the GI bleeding at this point is unknown.  It can represent any area proximal to the ostomy and if the patient should have any further bleeding a red tagged cell scan would be the next step with possible vascular surgery embolization if the bleeding source is not amenable to being reach with the scope.  The patient and the ER doctor have been explained this plan.  I will follow the patient along with you.  Thank you for involving me in the care of this patient.      LOS: 0 days   Tammy Copping, MD, MD. NOLIA 08/14/2024, 9:17 PM,  Pager [phone] 7am-5pm  Check AMION for 5pm -7am coverage and on weekends   Note: This dictation was prepared with Dragon dictation along with smaller phrase technology.  Any transcriptional errors that result from this process are unintentional.

## 2024-08-14 NOTE — ED Provider Notes (Addendum)
 [facility] Provider Note    Event Date/Time   First MD Initiated Contact with Patient 08/14/24 1343     (approximate)   History   GI Bleeding   HPI  Tammy Arias is a 68 y.o. female with history of ischemic cardiomyopathy who comes in with concerns for bleeding from her ileostomy.  Patient reports having issues with bleeding from her ileostomy previously.  She reports that it started to worsen yesterday she did stop taking her Plavix on 9/9.  She reports her hemoglobin yesterday was 9.2.  She has needed blood transfusions previously.  She reports endoscopies as well as capsule studies done without being able to figure out the cause of the bleeding.  I reviewed the note from 8/25 where patient does have a history of liver cirrhosis with esophageal varices, AVMs.  She had GI bleeding that time that was negative CTA with an EGD that did not show active bleeding varices  Physical Exam   Triage Vital Signs: ED Triage Vitals  Encounter Vitals Group     BP 08/14/24 1331 (!) 85/42     Girls Systolic BP Percentile --      Girls Diastolic BP Percentile --      Boys Systolic BP Percentile --      Boys Diastolic BP Percentile --      Pulse Rate 08/14/24 1331 66     Resp 08/14/24 1331 17     Temp 08/14/24 1331 97.7 F (36.5 C)     Temp Source 08/14/24 1331 Oral     SpO2 08/14/24 1331 99 %     Weight 08/14/24 1335 126 lb (57.2 kg)     Height 08/14/24 1335 5' 1 (1.549 m)     Head Circumference --      Peak Flow --      Pain Score 08/14/24 1335 0     Pain Loc --      Pain Education --      Exclude from Growth Chart --     Most recent vital signs: Vitals:   08/14/24 1331  BP: (!) 85/42  Pulse: 66  Resp: 17  Temp: 97.7 F (36.5 C)  SpO2: 99%     General: Awake, no distress.  CV:  Good peripheral perfusion.  Resp:  Normal effort.  Abd:  No distention.  Soft and nontender.  Bright red blood through ileostomy Other:     ED Results / Procedures  / Treatments   Labs (all labs ordered are listed, but only abnormal results are displayed) Labs Reviewed  CBC WITH DIFFERENTIAL/PLATELET  COMPREHENSIVE METABOLIC PANEL WITH GFR  PROTIME-INR  APTT  TYPE AND SCREEN     EKG  My interpretation of EKG:    RADIOLOGY I have reviewed the xray personally and interpreted    PROCEDURES:  Critical Care performed: Yes, see critical care procedure note(s)  .Critical Care  Performed by: Ernest Ronal BRAVO, MD Authorized by: Ernest Ronal BRAVO, MD   Critical care provider statement:    Critical care time (minutes):  30   Critical care was necessary to treat or prevent imminent or life-threatening deterioration of the following conditions: gi bleed.   Critical care was time spent personally by me on the following activities:  Development of treatment plan with patient or surrogate, discussions with consultants, evaluation of patient's response to treatment, examination of patient, ordering and review of laboratory studies, ordering and review of radiographic studies, ordering and performing treatments and interventions,  pulse oximetry, re-evaluation of patient's condition and review of old charts .1-3 Lead EKG Interpretation  Performed by: Ernest Ronal BRAVO, MD Authorized by: Ernest Ronal BRAVO, MD     Interpretation: normal     ECG rate assessment: normal     Rhythm: sinus rhythm     Ectopy: none     Conduction: normal      MEDICATIONS ORDERED IN ED: Medications  0.9 %  sodium chloride  infusion (Manually program via Guardrails IV Fluids) (has no administration in time range)  octreotide  (SANDOSTATIN ) 2 mcg/mL load via infusion 50 mcg (has no administration in time range)    And  octreotide  (SANDOSTATIN ) 500 mcg in sodium chloride  0.9 % 250 mL (2 mcg/mL) infusion (has no administration in time range)  sodium chloride  0.9 % bolus 500 mL (500 mLs Intravenous New Bag/Given 08/14/24 1430)  pantoprazole  (PROTONIX ) injection 40 mg (40 mg Intravenous Given  08/14/24 1505)  iohexol  (OMNIPAQUE ) 350 MG/ML injection 80 mL (80 mLs Intravenous Contrast Given 08/14/24 1523)     IMPRESSION / MDM / ASSESSMENT AND PLAN / ED COURSE  I reviewed the triage vital signs and the nursing notes.   Patient's presentation is most consistent with acute presentation with potential threat to life or bodily function.   Patient comes in with GI bleeding hypotensive she is otherwise mentating well we will start off with 500 cc of fluid as her repeat blood pressure was actually in the 90s systolic.  However I am concerned about this active bleeding I will call GI.  Patient will need most likely need blood transfusions after hemoglobin results.   3:01 PM  Repeat blood pressures show maps over 65 and she is really has not had any of the fluids except for about 100 cc.  Discussed with Dr. Jinny who did recommend CT angio, octreotide ,   Patient did have some more bright red blood per ileostomy bag but she remains hemodynamically stable her maps are now over 65.  Her hemoglobin did drop to 8.  I will put in for 1 unit of blood given active GI bleeding and I suspect that she will drop lower.  If she gets hypotensive again over the bleeding is increasing we may need to consider emergency release blood.  Patient will be handed off to oncoming team pending CT imaging admission, blood tranfsuion.  3:51 PM reevaluated patient she reports feeling similar to when she initially got here does not feel any worse.  Her maps remain over 65  4:00 PM discussed with blood bank who stated that I would be about 30 minutes to an hour before blood would be ready given she does have positive antibodies.  They did recommend trying to wait to help prevent any reaction to the blood.  Patient also preferred to wait for type and cross blood.  She reports feeling no different than she first got here and her pressures however are in the 80s upon recheck.  We will give another 500 cc of fluid but both  oncoming doctor and nurse are aware that if she starts decompensating at all that we should do the emergency blood but patient preferred to wait for type and cross blood to help prevent interactions.  on the cardiac monitor to evaluate for evidence of arrhythmia and/or significant heart rate changes.      FINAL CLINICAL IMPRESSION(S) / ED DIAGNOSES   Final diagnoses:  Gastrointestinal hemorrhage, unspecified gastrointestinal hemorrhage type     Rx / DC Orders   ED  Discharge Orders     None        Note:  This document was prepared using Dragon voice recognition software and may include unintentional dictation errors.   Ernest Ronal BRAVO, MD 08/14/24 1555    Ernest Ronal BRAVO, MD 08/14/24 1555    Ernest Ronal BRAVO, MD 08/14/24 [phone]

## 2024-08-14 NOTE — ED Notes (Signed)
 of bright red blood emptied from ostomy. MD notified at this time

## 2024-08-14 NOTE — ED Triage Notes (Signed)
 Pt reports blood in her ileostomy bag worsening since yesterday. Pt takes ASA, discontinued Plavix 08/06/24. Pt appears pale. HGB 9.2 yesterday. Pt had blood transfusion at the end of August. Pt denies dizziness or SOB.

## 2024-08-14 NOTE — H&P (Signed)
 NAME:  Tammy Arias, MRN:  [id number], DOB:  1956/07/17, LOS: 0 ADMISSION DATE:  08/14/2024, CONSULTATION DATE:  08/14/24 REFERRING MD:  Dr. Dicky, CHIEF COMPLAINT:  GI bleeding   History of Present Illness:  68 yo F presenting to [facility] ED from home for evaluation of GI bleeding.  History obtained through chart review and patient bedside report. Patient reports noting blood mixed with her stool in her ileostomy bag for an unknown amount of time. Then on 9/17 she noted bright red blood and a large clot when emptying the bag. This bleeding had not stopped causing her to come in for evaluation. She reports generalized fatigue and headache just today. She denies blurred vision, vomiting/ nausea/ acute abdominal pain, dysuria, chest pain, cough, dizziness, falls or LOC. She endorses chronic dyspnea and chronic RUQ tenderness due to a hernia and post surgical changes after her ileostomy was placed. She also reports being changed to Nadolol from metoprolol  and that she had been checking her BP at home because of this change, noting it was low today. Because of this she took a midodrine  this morning.  Of note patient has had previous episodes of bleeding requiring hospitalization and resuscitation. She has undergone extensive GI work up including EGD and capsule studies without identification of a source. Previous CT GI bleed imaging from 07/19/24 identified extensive venous collaterals raising suspicion that this might be the source in the setting of liver cirrhosis, esophageal varices and AVM's. Bleeding has previously stopped and started without specific intervention. She is not on chronic anticoagulation and Plavix was stopped after most recent hospitalization in August. She does endorse taking a baby aspirin  daily. Patient also has chronic anemia, receiving iron  infusions and has chronic pancytopenia. She was scheduled for her first outpatient consultation with heme/onc for her pancytopenia next week. She  was also recently started on protonix  BID.  ED course: Upon arrival patient alert and oriented, pale with soft BP. Labs significant for acutely dropped Hgb, Hgb on 9/4 was 9.2 > now 8.0.  Medications given: Octreotide  bolus followed by infusion, protonix , 1 L IVF bolus, IV contrast Initial Vitals: 97.7, 17, 70, 90/40 & 100% on RA Significant labs: (Labs/ Imaging personally reviewed) Chemistry: Na+: 134, K+: 3.6, BUN/Cr.: 31/1.68, Serum CO2/ AG: 22/10 Hematology: WBC: 5.3, Hgb: 8.0, platelets: 95  CT angio abdomen/pelvis w/wo contrast 08/14/24:  No evidence of active GI bleed. 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 No evidence of bowel obstruction. Cirrhosis with small ascites. Cholelithiasis.  Aortic Atherosclerosis  PCCM consulted for admission due to circulatory shock secondary to acute blood loss anemia in the setting of pancytopenia and chronic GIB requiring vasopressor support.  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  Interim History / Subjective:  Patient alert and oriented, plan of care discussed all questions and concerns  answered at this time.  Objective    Blood pressure (!)  79/51, pulse 66, temperature 98.3 F (36.8 C), temperature source Oral, resp. rate 16, height 5' 1 (1.549 m), weight 57.2 kg, SpO2 100%.        Intake/Output Summary (Last 24 hours) at 08/14/2024 1915 Last data filed at 08/14/2024 1845 Gross per 24 hour  Intake 1315 ml  Output 800 ml  Net 515 ml   Filed Weights   08/14/24 1335  Weight: 57.2 kg    Examination: General: Adult female, critically ill, lying in bed, NAD HEENT: MM pink/moist, anicteric, atraumatic, neck supple Neuro: A&O x 4, able to follow commands, PERRL +4 , MAE CV: s1s2 RRR, NSR on monitor, no r/m/g Pulm: Regular, non labored on RA, breath sounds clear-BUL & diminished-BLL GI: soft, rounded, ostomy in place, mild chronic tenderness RUQ where hernia is, bs x 4 Skin: scattered ecchymosis and excoriation from scratching noted Extremities: warm/dry, pulses + 2 R/P, no edema noted  Resolved problem list   Assessment and Plan  #Suspected Acute Gastrointestinal Bleed secondary to unknown etiology #Circulatory Shock s/t ABLA #Chronic Pancytopenia #Acute on Chronic Anemia  PMHx: Liver Cirrhosis with esophageal varices and portal hypertension - Monitor for s/s of bleeding - Transfuse for Hgb <8 due to cardiac Hx and ongoing bleeding.   - Consider transfusion of platelets if < 10 - PPI BID - continue Octreotide  infusion - levophed  drip PRN to maintain MAP > 65 - Maintain up to date type & screen - continuous cardiac monitoring - NPO - Daily CBC, PT/ INR monitoring PRN - GI & Vascular consulted, appreciate input. Both recommend resuscitation and tagged bleeding scan if bleeding continues - consider Heme/Onc consultation while inpatient   Acute Kidney Injury superimposed on CKD Stage 3b suspect pre-renal secondary to hemorrhagic shock Baseline Cr: 0.88, Cr on admission:1.68 - Strict I/O's: alert provider if UOP < 0.5 mL/kg/hr - gentle IVF hydration  - Daily BMP, replace electrolytes PRN - Avoid nephrotoxic  agents as able, ensure adequate renal perfusion  OSA on CPAP PMHx: former smoker - CPAP at bedtime - Supplemental O2 PRN to maintain SpO2 > 90%  Chronic HFrEF without exacerbation CAD s/p CABG HLD PAF s/p watchman device PMHx: HTN, pulmonary HTN, mitral/tricuspid regurgitation - Hold outpatient regimen while hypotensive and NPO, consider restarting as patient stabilizes  Type 2 Diabetes Mellitus Hemoglobin A1C: 5.9 - Monitor CBG Q 4 hours while NPO - target range while in ICU: 140-180, start SSI PRN - follow ICU hyper/hypo-glycemia protocol  Hypothyroidism - hold outpatient PO levothyroxine , consider restarting as patient stabilizes  Labs   CBC: Recent Labs  Lab 08/14/24 1356  WBC 5.3  NEUTROABS 3.2  HGB 8.0*  HCT 24.9*  MCV 103.8*  PLT 95*    Basic Metabolic Panel: Recent Labs  Lab 08/14/24 1356  NA 134*  K 3.6  CL 102  CO2 22  GLUCOSE 110*  BUN 31*  CREATININE 1.68*  CALCIUM  8.6*   GFR: Estimated Creatinine Clearance: 23.8 mL/min (A) (by C-G formula based on SCr of 1.68 mg/dL (H)). Recent Labs  Lab 08/14/24 1356  WBC 5.3    Liver Function Tests: Recent Labs  Lab 08/14/24 1356  AST 34  ALT 16  ALKPHOS 98  BILITOT 1.0  PROT 5.9*  ALBUMIN  3.2*   No results for input(s): LIPASE, AMYLASE in the last 168 hours. No results for input(s): AMMONIA in the last 168 hours.  ABG    Component Value Date/Time  PHART 7.47 (H) 03/01/2024 1055   PCO2ART 40 03/01/2024 1055   PO2ART 112 (H) 03/01/2024 1055   HCO3 29.1 (H) 03/01/2024 1055   ACIDBASEDEF 2.7 (H) 02/29/2024 0952   O2SAT 100 03/02/2024 0813     Coagulation Profile: Recent Labs  Lab 08/14/24 1356  INR 1.3*    Cardiac Enzymes: No results for input(s): CKTOTAL, CKMB, CKMBINDEX, TROPONINI in the last 168 hours.  HbA1C: Hgb A1c MFr Bld  Date/Time Value Ref Range Status  02/28/2024 06:08 AM 5.9 (H) 4.8 - 5.6 % Final    Comment:    (NOTE) Pre diabetes:           5.7%-6.4%  Diabetes:              >6.4%  Glycemic control for   <7.0% adults with diabetes   06/07/2023 02:58 AM 6.3 (H) 4.8 - 5.6 % Final    Comment:    (NOTE)         Prediabetes: 5.7 - 6.4         Diabetes: >6.4         Glycemic control for adults with diabetes: <7.0     CBG: No results for input(s): GLUCAP in the last 168 hours.  Review of Systems: Positives in BOLD  Gen: Denies fever, chills, weight change, fatigue, night sweats HEENT: Denies blurred vision, double vision, hearing loss, tinnitus, sinus congestion, rhinorrhea, sore throat, neck stiffness, dysphagia PULM: Denies shortness of breath, cough, sputum production, hemoptysis, wheezing CV: Denies chest pain, edema, orthopnea, paroxysmal nocturnal dyspnea, palpitations GI: Denies abdominal pain, nausea, vomiting, diarrhea, hematochezia, melena, constipation, change in bowel habits GU: Denies dysuria, hematuria, polyuria, oliguria, urethral discharge Endocrine: Denies hot or cold intolerance, polyuria, polyphagia or appetite change Derm: Denies rash, dry skin, scaling or peeling skin change Heme: Denies easy bruising, bleeding, bleeding gums Neuro: Denies headache, numbness, weakness, slurred speech, loss of memory or consciousness  Past Medical History:  She,  has a past medical history of AC (acromioclavicular) joint bone spurs, right, Anemia, Arthritis, Atrial fibrillation (HCC), Cardiac murmur, CHF (congestive heart failure) (HCC), Chronic anticoagulation, Clostridium difficile colitis (02/28/2024), Cor triatriatum, Coronary artery disease, Cortical cataract, DOE (dyspnea on exertion), DOE (dyspnea on exertion), GERD (gastroesophageal reflux disease), HFrEF (heart failure with reduced ejection fraction) (HCC), History of shingles (2004), Hyperlipidemia, Hypertension, Hypothyroidism, IBS (irritable bowel syndrome), Ischemic cardiomyopathy, Lower GI bleed (06/07/2023), Lumbar scoliosis, Lumbar spinal stenosis, Melena  (12/17/2018), Migraines, Osteoporosis, Pancolitis (HCC) (02/28/2024), Pulmonary HTN (HCC), S/P CABG x 3 (01/28/2004), Sleep apnea, T2DM (type 2 diabetes mellitus) (HCC), TIA (transient ischemic attack) (05/29/2016), and Vitamin B 12 deficiency.   Surgical History:   Past Surgical History:  Procedure Laterality Date   CARDIAC CATHETERIZATION     CATARACT EXTRACTION W/ INTRAOCULAR LENS IMPLANT Bilateral    Cataract Extraction with IOL   COLECTOMY  02/28/2024   Procedure: COLECTOMY, TOTAL;  Surgeon: Lane Shope, MD;  Location: [facility] ORS;  Service: General;;   COLONOSCOPY N/A 12/19/2018   Procedure: COLONOSCOPY;  Surgeon: Janalyn Keene NOVAK, MD;  Location: [facility] ENDOSCOPY;  Service: Endoscopy;  Laterality: N/A;   COLONOSCOPY WITH PROPOFOL  N/A 06/17/2020   Procedure: COLONOSCOPY WITH PROPOFOL ;  Surgeon: Janalyn Keene NOVAK, MD;  Location: [facility] ENDOSCOPY;  Service: Endoscopy;  Laterality: N/A;   COLONOSCOPY WITH PROPOFOL  N/A 03/30/2023   Procedure: COLONOSCOPY WITH PROPOFOL ;  Surgeon: Onita Elspeth Sharper, DO;  Location: [facility] ENDOSCOPY;  Service: Endoscopy;  Laterality: N/A;   COR TRIATRIATUM REPAIR N/A 01/28/2004   CORONARY ARTERY  BYPASS GRAFT N/A 01/28/2004   3v CABG   ESOPHAGOGASTRODUODENOSCOPY N/A 12/19/2018   Procedure: ESOPHAGOGASTRODUODENOSCOPY (EGD);  Surgeon: Janalyn Keene NOVAK, MD;  Location: [facility] ENDOSCOPY;  Service: Endoscopy;  Laterality: N/A;   ESOPHAGOGASTRODUODENOSCOPY N/A 06/11/2024   Procedure: EGD (ESOPHAGOGASTRODUODENOSCOPY);  Surgeon: Jinny Carmine, MD;  Location: [facility] ENDOSCOPY;  Service: Endoscopy;  Laterality: N/A;   ESOPHAGOGASTRODUODENOSCOPY N/A 07/18/2024   Procedure: EGD (ESOPHAGOGASTRODUODENOSCOPY);  Surgeon: Onita Elspeth Sharper, DO;  Location: [facility] ENDOSCOPY;  Service: Gastroenterology;  Laterality: N/A;  with Enteroscopy        DM on Plavix   ESOPHAGOGASTRODUODENOSCOPY (EGD) WITH PROPOFOL  N/A 06/17/2020   Procedure: ESOPHAGOGASTRODUODENOSCOPY (EGD) WITH  PROPOFOL ;  Surgeon: Janalyn Keene NOVAK, MD;  Location: [facility] ENDOSCOPY;  Service: Endoscopy;  Laterality: N/A;   ESOPHAGOGASTRODUODENOSCOPY (EGD) WITH PROPOFOL  N/A 03/30/2023   Procedure: ESOPHAGOGASTRODUODENOSCOPY (EGD) WITH PROPOFOL ;  Surgeon: Onita Elspeth Sharper, DO;  Location: [facility] ENDOSCOPY;  Service: Endoscopy;  Laterality: N/A;   GIVENS CAPSULE STUDY N/A 07/21/2024   Procedure: IMAGING PROCEDURE, GI TRACT, INTRALUMINAL, VIA CAPSULE;  Surgeon: Therisa Bi, MD;  Location: [facility] ENDOSCOPY;  Service: Gastroenterology;  Laterality: N/A;   LAPAROTOMY N/A 02/28/2024   Procedure: LAPAROTOMY, EXPLORATORY;  Surgeon: Lane Shope, MD;  Location: [facility] ORS;  Service: General;  Laterality: N/A;   LEFT ATRIAL APPENDAGE OCCLUSION     RIGHT/LEFT HEART CATH AND CORONARY ANGIOGRAPHY Bilateral 03/29/2017   Procedure: Right/Left Heart Cath and Coronary Angiography;  Surgeon: Vinie DELENA Jude, MD;  Location: [facility] INVASIVE CV LAB;  Service: Cardiovascular;  Laterality: Bilateral;   TOTAL KNEE ARTHROPLASTY Right 04/05/2022   Procedure: TOTAL KNEE ARTHROPLASTY;  Surgeon: Kathlynn Sharper, MD;  Location: [facility] ORS;  Service: Orthopedics;  Laterality: Right;   TUBAL LIGATION     VENTRAL HERNIA REPAIR N/A 04/21/2017   Procedure: HERNIA REPAIR VENTRAL ADULT;  Surgeon: Claudene Larinda Bolder, MD;  Location: [facility] ORS;  Service: General;  Laterality: N/A;     Social History:   reports that she quit smoking about 25 years ago. Her smoking use included cigarettes. She has never used smokeless tobacco. She reports that she does not drink alcohol and does not use drugs.   Family History:  Her family history includes Breast cancer in her cousin; Cancer in her sister and sister; Diabetes in her father; Valvular heart disease in her mother.   Allergies Allergies  Allergen Reactions   Vioxx [Rofecoxib] Swelling     Home Medications  Prior to Admission medications   Medication Sig Start Date End Date Taking? Authorizing  Provider  albuterol  (PROVENTIL  HFA;VENTOLIN  HFA) 108 (90 Base) MCG/ACT inhaler Inhale 2 puffs into the lungs every 6 (six) hours as needed for wheezing or shortness of breath.   Yes [provider]  allopurinol  (ZYLOPRIM ) 300 MG tablet Take 300 mg by mouth daily.   Yes [provider]  aspirin  EC 81 MG tablet Take 81 mg by mouth daily. Swallow whole.   Yes [provider]  Blood Glucose Monitoring Suppl (ACCU-CHEK GUIDE ME) w/Device KIT  08/01/24  Yes [provider]  cyanocobalamin  (VITAMIN B12) 1000 MCG tablet Take 1,000 mcg by mouth daily.   Yes [provider]  empagliflozin  (JARDIANCE ) 10 MG TABS tablet Take 1 tablet by mouth daily. 04/17/24  Yes [provider]  fluticasone  (FLONASE ) 50 MCG/ACT nasal spray Place 2 sprays into the nose daily. 05/20/24  Yes [provider]  iron  polysaccharides (NIFEREX) 150 MG capsule Take 300 mg by mouth.  Take 2 capsules (300 mg total) by  mouth once daily for 90 days 06/20/24 09/18/24 Yes [provider]  latanoprost  (XALATAN ) 0.005 % ophthalmic solution Place 1 drop into both eyes at bedtime.   Yes [provider]  levothyroxine  (SYNTHROID ) 50 MCG tablet Take 50 mcg by mouth daily before breakfast.   Yes [provider]  magnesium  oxide (MAG-OX) 400 MG tablet Take 400 mg by mouth. Take 1 tablet (400 mg total) by mouth once daily 05/20/24 05/20/25 Yes [provider]  metFORMIN  (GLUCOPHAGE ) 1000 MG tablet Take 1,000 mg by mouth 2 (two) times daily with a meal.   Yes [provider]  nadolol (CORGARD) 20 MG tablet Take 20 mg by mouth daily. 08/12/24 08/12/25 Yes [provider]  pantoprazole  (PROTONIX ) 40 MG tablet Take 40 mg by mouth daily. 01/28/21  Yes [provider]  rosuvastatin  (CRESTOR ) 5 MG tablet Take 5 mg by mouth daily. 02/20/24 02/19/25 Yes [provider]  spironolactone  (ALDACTONE ) 25 MG tablet Take 0.5 tablets (12.5 mg total)  by mouth daily. 03/31/24  Yes Fausto Sor A, DO  torsemide  (DEMADEX ) 20 MG tablet Take 1 tablet (20 mg total) by mouth daily as needed (for weight gain or swelling). 03/31/24  Yes Fausto Sor A, DO  carvedilol  (COREG ) 3.125 MG tablet Take 3.125 mg by mouth 2 (two) times daily with a meal.    [provider]  clopidogrel (PLAVIX) 75 MG tablet Take 75 mg by mouth. Take 1 tablet (75 mg total) by mouth once daily for 182 days Patient not taking: Reported on 08/14/2024 05/29/24 11/27/24  [provider]  loperamide  (IMODIUM  A-D) 2 MG tablet Take 2 tablets (4 mg total) by mouth 4 (four) times daily as needed for diarrhea or loose stools. Patient not taking: Reported on 06/07/2024 04/04/24   Lane Shope, MD     Critical care time: 68 minutes       Jenita Jama Meek, AGACNP-BC Acute Care Nurse Practitioner [facility]   [phone] / [phone] Please see Amion for details.

## 2024-08-14 NOTE — Progress Notes (Signed)
 Received request to evaluate the patient for hypotension in the setting of GI bleed with frank blood noted from the ostomy tube. Patient with lower repeat hemoglobin compared to outpatient baseline.  Recommend initiation of blood product transfusion with PRBC. If she remains hypotensive after administration of 1 unit of PRBC we will admit to the ICU.  Belva November, MD [facility] 08/14/2024 5:21 PM

## 2024-08-14 NOTE — Progress Notes (Signed)
 PHARMACY CONSULT NOTE - ELECTROLYTES  Pharmacy Consult for Electrolyte Monitoring and Replacement   Recent Labs: Height: 5' 1 (154.9 cm) Weight: 57.2 kg (126 lb) IBW/kg (Calculated) : 47.8 Estimated Creatinine Clearance: 23.8 mL/min (A) (by C-G formula based on SCr of 1.68 mg/dL (H)). Potassium (mmol/L)  Date Value  08/14/2024 3.6   Magnesium  (mg/dL)  Date Value  94/88/7974 2.2   Calcium  (mg/dL)  Date Value  90/82/7974 8.6 (L)   Albumin  (g/dL)  Date Value  90/82/7974 3.2 (L)  05/18/2020 4.7   Phosphorus (mg/dL)  Date Value  94/96/7974 3.2   Sodium (mmol/L)  Date Value  08/14/2024 134 (L)   Corrected Ca: 9.2 mg/dL  Assessment  Tammy Arias is a 68 y.o. female presenting with bleeding from her ileostomy. PMH significant for ischemic cardiomyopathy . Pharmacy has been consulted to monitor and replace electrolytes.  Diet: NPO MIVF: None Pertinent medications: None  Goal of Therapy: K>4, Mag>2 given cardiac history  Plan:  Give KCl 10 mEq IV x 4 Check BMP, Mg, Phos with AM labs  Thank you for allowing pharmacy to be a part of this patient's care.  Lum VEAR Mania, PharmD Clinical Pharmacist 08/14/2024 7:18 PM

## 2024-08-14 NOTE — Progress Notes (Signed)
 eLink Physician-Brief Progress Note Patient Name: Tammy Arias DOB: Jul 17, 1956 MRN: [id number]   Date of Service  08/14/2024  HPI/Events of Note  68 year old female with a history of heart failure with reduced ejection fraction (35%), pulmonary hypertension, cirrhosis with portal hypertension, A-fib, history of GI bleed, and metabolic syndrome who presents to the emergency department for bleeding from her ileostomy-previous endoscopies and capsule studies have been unremarkable for similar bleeding.  On examination, the patient is hypotensive but otherwise normal vitals, saturating 100% on room air.  Results with mild electrolyte disturbances, elevated creatinine, macrocytic anemia and thrombocytopenia.  Imaging reviewed  eICU Interventions  Undergoing transfusion of PRBC, holding home dual antiplatelet therapy  GDMT on hold, vasopressors if needed to maintain MAP greater than 65  Scheduled PPI, octreotide , consultation with GI at some point  DVT prophylaxis with SCDs, chemoprophylaxis contraindicated with bleeding GI prophylaxis with therapeutic PPI     Intervention Category Evaluation Type: New Patient Evaluation  Enzio Buchler 08/14/2024, 8:19 PM

## 2024-08-15 ENCOUNTER — Encounter: Payer: Self-pay | Admitting: Student in an Organized Health Care Education/Training Program

## 2024-08-15 ENCOUNTER — Other Ambulatory Visit: Payer: Self-pay

## 2024-08-15 ENCOUNTER — Inpatient Hospital Stay

## 2024-08-15 DIAGNOSIS — R578 Other shock: Secondary | ICD-10-CM | POA: Diagnosis not present

## 2024-08-15 DIAGNOSIS — N179 Acute kidney failure, unspecified: Secondary | ICD-10-CM | POA: Diagnosis not present

## 2024-08-15 DIAGNOSIS — I4891 Unspecified atrial fibrillation: Secondary | ICD-10-CM | POA: Diagnosis not present

## 2024-08-15 DIAGNOSIS — I5020 Unspecified systolic (congestive) heart failure: Secondary | ICD-10-CM

## 2024-08-15 DIAGNOSIS — I2510 Atherosclerotic heart disease of native coronary artery without angina pectoris: Secondary | ICD-10-CM

## 2024-08-15 DIAGNOSIS — I340 Nonrheumatic mitral (valve) insufficiency: Secondary | ICD-10-CM

## 2024-08-15 DIAGNOSIS — I5023 Acute on chronic systolic (congestive) heart failure: Secondary | ICD-10-CM

## 2024-08-15 DIAGNOSIS — K922 Gastrointestinal hemorrhage, unspecified: Secondary | ICD-10-CM | POA: Diagnosis not present

## 2024-08-15 LAB — CBC WITH DIFFERENTIAL/PLATELET
Abs Immature Granulocytes: 0.07 K/uL (ref 0.00–0.07)
Basophils Absolute: 0.1 K/uL (ref 0.0–0.1)
Basophils Relative: 1 %
Eosinophils Absolute: 1.2 K/uL — ABNORMAL HIGH (ref 0.0–0.5)
Eosinophils Relative: 13 %
HCT: 26.3 % — ABNORMAL LOW (ref 36.0–46.0)
Hemoglobin: 8.7 g/dL — ABNORMAL LOW (ref 12.0–15.0)
Immature Granulocytes: 1 %
Lymphocytes Relative: 9 %
Lymphs Abs: 0.9 K/uL (ref 0.7–4.0)
MCH: 31.2 pg (ref 26.0–34.0)
MCHC: 33.1 g/dL (ref 30.0–36.0)
MCV: 94.3 fL (ref 80.0–100.0)
Monocytes Absolute: 0.8 K/uL (ref 0.1–1.0)
Monocytes Relative: 9 %
Neutro Abs: 6 K/uL (ref 1.7–7.7)
Neutrophils Relative %: 67 %
Platelets: 144 K/uL — ABNORMAL LOW (ref 150–400)
RBC: 2.79 MIL/uL — ABNORMAL LOW (ref 3.87–5.11)
RDW: 19.9 % — ABNORMAL HIGH (ref 11.5–15.5)
WBC: 9 K/uL (ref 4.0–10.5)
nRBC: 0.2 % (ref 0.0–0.2)

## 2024-08-15 LAB — PREPARE RBC (CROSSMATCH)

## 2024-08-15 LAB — LACTIC ACID, PLASMA
Lactic Acid, Venous: 2.5 mmol/L (ref 0.5–1.9)
Lactic Acid, Venous: 1.9 mmol/L (ref 0.5–1.9)
Lactic Acid, Venous: 2.2 mmol/L (ref 0.5–1.9)

## 2024-08-15 LAB — CORTISOL: Cortisol, Plasma: 9.9 ug/dL

## 2024-08-15 LAB — BASIC METABOLIC PANEL WITH GFR
Anion gap: 11 (ref 5–15)
BUN: 34 mg/dL — ABNORMAL HIGH (ref 8–23)
CO2: 20 mmol/L — ABNORMAL LOW (ref 22–32)
Calcium: 7.9 mg/dL — ABNORMAL LOW (ref 8.9–10.3)
Chloride: 102 mmol/L (ref 98–111)
Creatinine, Ser: 1.77 mg/dL — ABNORMAL HIGH (ref 0.44–1.00)
GFR, Estimated: 31 mL/min — ABNORMAL LOW (ref >60)
Glucose, Bld: 164 mg/dL — ABNORMAL HIGH (ref 70–99)
Potassium: 4.4 mmol/L (ref 3.5–5.1)
Sodium: 133 mmol/L — ABNORMAL LOW (ref 135–145)

## 2024-08-15 LAB — GLUCOSE, CAPILLARY
Glucose-Capillary: 97 mg/dL (ref 70–99)
Glucose-Capillary: 168 mg/dL — ABNORMAL HIGH (ref 70–99)
Glucose-Capillary: 170 mg/dL — ABNORMAL HIGH (ref 70–99)
Glucose-Capillary: 189 mg/dL — ABNORMAL HIGH (ref 70–99)
Glucose-Capillary: 142 mg/dL — ABNORMAL HIGH (ref 70–99)
Glucose-Capillary: 180 mg/dL — ABNORMAL HIGH (ref 70–99)

## 2024-08-15 LAB — COOXEMETRY PANEL
Carboxyhemoglobin: 2.3 % — ABNORMAL HIGH (ref 0.5–1.5)
Methemoglobin: 0.8 % (ref 0.0–1.5)
O2 Saturation: 55.4 %
Total hemoglobin: 9.8 g/dL — ABNORMAL LOW (ref 12.0–16.0)
Total oxygen content: 53.7 %

## 2024-08-15 LAB — PHOSPHORUS: Phosphorus: 2.7 mg/dL (ref 2.5–4.6)

## 2024-08-15 LAB — MAGNESIUM
Magnesium: 1.2 mg/dL — ABNORMAL LOW (ref 1.7–2.4)
Magnesium: 2.2 mg/dL (ref 1.7–2.4)

## 2024-08-15 LAB — HEMOGLOBIN AND HEMATOCRIT, BLOOD
HCT: 27 % — ABNORMAL LOW (ref 36.0–46.0)
HCT: 26.8 % — ABNORMAL LOW (ref 36.0–46.0)
HCT: 26.9 % — ABNORMAL LOW (ref 36.0–46.0)
Hemoglobin: 9 g/dL — ABNORMAL LOW (ref 12.0–15.0)
Hemoglobin: 9 g/dL — ABNORMAL LOW (ref 12.0–15.0)
Hemoglobin: 9.4 g/dL — ABNORMAL LOW (ref 12.0–15.0)

## 2024-08-15 LAB — TROPONIN I (HIGH SENSITIVITY): Troponin I (High Sensitivity): 30 ng/L — ABNORMAL HIGH (ref <18)

## 2024-08-15 LAB — C DIFFICILE QUICK SCREEN W PCR REFLEX
C Diff antigen: POSITIVE — AB
C Diff interpretation: DETECTED
C Diff toxin: POSITIVE — AB

## 2024-08-15 LAB — BRAIN NATRIURETIC PEPTIDE: B Natriuretic Peptide: 391.6 pg/mL — ABNORMAL HIGH (ref 0.0–100.0)

## 2024-08-15 MED ORDER — LEVOTHYROXINE SODIUM 50 MCG PO TABS
50.0000 ug | ORAL_TABLET | Freq: Every day | ORAL | Status: DC
  Administered 2024-08-15 – 2024-08-22 (×7): 50 ug via ORAL
  Filled 2024-08-15 (×7): qty 1

## 2024-08-15 MED ORDER — VANCOMYCIN HCL 125 MG PO CAPS: 125.0000 mg | ORAL_CAPSULE | Freq: Four times a day (QID) | ORAL | Status: DC

## 2024-08-15 MED ORDER — MAGNESIUM SULFATE 4 GM/100ML IV SOLN
4.0000 g | Freq: Once | INTRAVENOUS | Status: AC
  Administered 2024-08-15: 4 g via INTRAVENOUS
  Filled 2024-08-15: qty 100

## 2024-08-15 MED ORDER — ORAL CARE MOUTH RINSE: 15.0000 mL | OROMUCOSAL | Status: DC | PRN

## 2024-08-15 MED ORDER — HYDROXYZINE HCL 25 MG PO TABS
25.0000 mg | ORAL_TABLET | Freq: Three times a day (TID) | ORAL | Status: DC | PRN
  Administered 2024-08-15: 25 mg via ORAL
  Filled 2024-08-15: qty 1

## 2024-08-15 MED ORDER — INSULIN ASPART 100 UNIT/ML IJ SOLN: 1.0000 [IU] | INTRAMUSCULAR | Status: DC

## 2024-08-15 MED ORDER — METRONIDAZOLE 500 MG/100ML IV SOLN
500.0000 mg | Freq: Three times a day (TID) | INTRAVENOUS | Status: DC
  Administered 2024-08-15 – 2024-08-17 (×5): 500 mg via INTRAVENOUS
  Filled 2024-08-15 (×7): qty 100

## 2024-08-15 MED ORDER — SODIUM CHLORIDE 0.9% IV SOLUTION: Freq: Once | INTRAVENOUS | Status: DC

## 2024-08-15 MED ORDER — SODIUM CHLORIDE 0.9% FLUSH
10.0000 mL | Freq: Two times a day (BID) | INTRAVENOUS | Status: DC
  Administered 2024-08-15: 10 mL
  Administered 2024-08-16: 30 mL
  Administered 2024-08-16: 10 mL
  Administered 2024-08-17: 30 mL
  Administered 2024-08-17 – 2024-08-18 (×2): 10 mL
  Administered 2024-08-18: 30 mL
  Administered 2024-08-19: 40 mL
  Administered 2024-08-19: 10 mL
  Administered 2024-08-20: 20 mL
  Administered 2024-08-20 – 2024-08-22 (×4): 10 mL

## 2024-08-15 MED ORDER — INSULIN ASPART 100 UNIT/ML IJ SOLN
0.0000 [IU] | Freq: Three times a day (TID) | INTRAMUSCULAR | Status: DC
  Administered 2024-08-15: 1 [IU] via SUBCUTANEOUS
  Administered 2024-08-15: 2 [IU] via SUBCUTANEOUS
  Administered 2024-08-16: 1 [IU] via SUBCUTANEOUS
  Administered 2024-08-16: 2 [IU] via SUBCUTANEOUS
  Administered 2024-08-17: 7 [IU] via SUBCUTANEOUS
  Administered 2024-08-17 (×2): 1 [IU] via SUBCUTANEOUS
  Administered 2024-08-18: 5 [IU] via SUBCUTANEOUS
  Administered 2024-08-18: 3 [IU] via SUBCUTANEOUS
  Administered 2024-08-18: 5 [IU] via SUBCUTANEOUS
  Administered 2024-08-19: 7 [IU] via SUBCUTANEOUS
  Administered 2024-08-19: 3 [IU] via SUBCUTANEOUS
  Administered 2024-08-20: 1 [IU] via SUBCUTANEOUS
  Administered 2024-08-20 – 2024-08-21 (×2): 3 [IU] via SUBCUTANEOUS
  Administered 2024-08-21: 1 [IU] via SUBCUTANEOUS
  Administered 2024-08-21: 3 [IU] via SUBCUTANEOUS
  Administered 2024-08-22: 1 [IU] via SUBCUTANEOUS
  Filled 2024-08-15 (×18): qty 1

## 2024-08-15 MED ORDER — DOBUTAMINE-DEXTROSE 4-5 MG/ML-% IV SOLN
2.5000 ug/kg/min | INTRAVENOUS | Status: DC
  Administered 2024-08-15: 2.5 ug/kg/min via INTRAVENOUS
  Filled 2024-08-15: qty 250

## 2024-08-15 MED ORDER — SODIUM CHLORIDE 0.9% FLUSH: 10.0000 mL | INTRAVENOUS | Status: DC | PRN

## 2024-08-15 MED ORDER — VANCOMYCIN HCL 125 MG PO CAPS
500.0000 mg | ORAL_CAPSULE | Freq: Four times a day (QID) | ORAL | Status: DC
Start: 2024-08-15 — End: 2024-08-17
  Administered 2024-08-15 – 2024-08-17 (×4): 500 mg via ORAL
  Filled 2024-08-15 (×4): qty 4

## 2024-08-15 MED ORDER — INSULIN ASPART 100 UNIT/ML IJ SOLN
0.0000 [IU] | Freq: Every day | INTRAMUSCULAR | Status: DC
  Administered 2024-08-16: 3 [IU] via SUBCUTANEOUS
  Administered 2024-08-17: 4 [IU] via SUBCUTANEOUS
  Administered 2024-08-18: 3 [IU] via SUBCUTANEOUS
  Administered 2024-08-19 – 2024-08-20 (×2): 2 [IU] via SUBCUTANEOUS
  Administered 2024-08-21: 3 [IU] via SUBCUTANEOUS
  Filled 2024-08-15 (×6): qty 1

## 2024-08-15 NOTE — Progress Notes (Signed)
 Peripherally Inserted Central Catheter Placement  The IV Nurse has discussed with the patient and/or persons authorized to consent for the patient, the purpose of this procedure and the potential benefits and risks involved with this procedure.  The benefits include less needle sticks, lab draws from the catheter, and the patient may be discharged home with the catheter. Risks include, but not limited to, infection, bleeding, blood clot (thrombus formation), and puncture of an artery; nerve damage and irregular heartbeat and possibility to perform a PICC exchange if needed/ordered by physician.  Alternatives to this procedure were also discussed.  Bard Power PICC patient education guide, fact sheet on infection prevention and patient information card has been provided to patient /or left at bedside.    PICC Placement Documentation  PICC Double Lumen 08/15/24 Left Basilic 39 cm 0 cm (Active)  Indication for Insertion or Continuance of Line Vasoactive infusions 08/15/24 1641  Exposed Catheter (cm) 0 cm 08/15/24 1641  Site Assessment Clean, Dry, Intact 08/15/24 1641  Lumen #1 Status Flushed;Saline locked;Blood return noted 08/15/24 1641  Lumen #2 Status Flushed;Saline locked;Blood return noted 08/15/24 1641  Dressing Type Transparent;Securing device 08/15/24 1641  Dressing Status Antimicrobial disc/dressing in place;Clean, Dry, Intact 08/15/24 1641  Line Care Connections checked and tightened 08/15/24 1641  Line Adjustment (NICU/IV Team Only) No 08/15/24 1641  Dressing Intervention New dressing;Adhesive placed at insertion site (IV team only) 08/15/24 1641  Dressing Change Due 08/22/24 08/15/24 1641       Renaee Notice Albarece 08/15/2024, 4:41 PM

## 2024-08-15 NOTE — Consult Note (Signed)
 Advanced Heart Failure Team Consult Note   Primary Physician: Valora Agent, MD Cardiologist:  None  Reason for Consultation: CHF  HPI:    Tammy Arias is seen today for evaluation of CHF at the request of Dr. Isadora.   68 y.o. 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  Patient had CABG in 2005, no recent chest pain.  She has had paroxysmal atrial fibrillation, due to recurrent bleeding had Watchman in 1/25.  Plavix post-Watchman was just stopped on 08/06/24. She had more GI bleeding on Plavix than she had had on Eliquis  in the past.  In 4/25, she had fulminant C difficile infection and ended up with colectomy and end ileostomy.  She has had systolic dysfunction due to ischemic cardiomyopathy and developed severe functional MR.  She had mTEER in 5/25.  Echo in 6/25 post-mTEER showed EF 30%, normal RV size/systolic function, s/p mTEER with 1 XTW Mitraclip with residual moderate-severe MR, severe TR, severe biatrial enlargement, PASP 58 mmHg. 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  In 8/25, she was hospitalized with GI bleeding.   She is followed at [facility] for cardiology, titration of GDMT has been difficult due to low BP.  She has had to take midodrine  on and off.    On 9/17, she again noted profuse bleeding from her ileostomy and was readmitted. She was hypotensive and was started on norepinephrine , currently running at 9. She had AKI with creatinine up to 1.77.  Hgb 7.9 yesterday, she was transfused. CTA abdomen/pelvis did not show site of active bleeding.  This morning, she no longer has bright red blood in her ostomy bag. She was noted to be positive for C difficile again and has been started on Flagyl  and po vancomycin . Lactate elevated at 2.5 today, hgb up to  8.7.  She is awake and alert, denies lightheadedness or dyspnea.  She is on an octreotide  gtt in addition to the NE.   PMH: 1. Atrial fibrillation: Paroxysmal.  S/p Watchman 1/25.  2. H/o GI bleeding: AVMs, portal hypertension in setting of cirrhosis.  3. C difficile colitis: With severe sepsis in 4/25, ended up requiring colectomy and end ileostomy.  4. CAD: s/p CABG in 2005.  5. Cirrhosis: With portal hypertension (esophageal varices).  Suspect NAFLD.  6. DM 7. HTN 8. Hyperlipidemia 9. Chronic systolic CHF: Ischemic cardiomyopathy.   - Echo (6/25): EF 30%, normal RV size/systolic function, s/p mTEER with 1 XTW Mitraclip with residual moderate-severe MR, severe TR, severe biatrial enlargement, PASP 58 mmHg.  10. Mitral regurgitation: Functional.  S/p mTEER, echo in 6/25 showed residual moderate-severe MR.   Home Medications Prior to Admission medications   Medication Sig Start Date End Date Taking? Authorizing Provider  albuterol  (PROVENTIL  HFA;VENTOLIN  HFA) 108 (90 Base) MCG/ACT inhaler Inhale 2 puffs into the lungs every 6 (six) hours as needed for wheezing or shortness of breath.   Yes [provider]  allopurinol  (ZYLOPRIM ) 300 MG tablet Take 300 mg by mouth daily.   Yes [provider]  aspirin  EC 81 MG tablet Take 81 mg by mouth daily. Swallow whole.   Yes [provider]  Blood Glucose Monitoring Suppl (ACCU-CHEK GUIDE ME) w/Device KIT  08/01/24  Yes [provider]  cyanocobalamin  (VITAMIN B12) 1000 MCG tablet Take 1,000 mcg  by mouth daily.   Yes [provider]  empagliflozin  (JARDIANCE ) 10 MG TABS tablet Take 1 tablet by mouth daily. 04/17/24  Yes [provider]  fluticasone  (FLONASE ) 50 MCG/ACT nasal spray Place 2 sprays into the nose daily. 05/20/24  Yes [provider]  iron  polysaccharides (NIFEREX) 150 MG capsule Take 300 mg by mouth.  Take 2 capsules (300 mg total) by mouth once daily for 90 days 06/20/24 09/18/24  Yes [provider]  latanoprost  (XALATAN ) 0.005 % ophthalmic solution Place 1 drop into both eyes at bedtime.   Yes [provider]  levothyroxine  (SYNTHROID ) 50 MCG tablet Take 50 mcg by mouth daily before breakfast.   Yes [provider]  magnesium  oxide (MAG-OX) 400 MG tablet Take 400 mg by mouth. Take 1 tablet (400 mg total) by mouth once daily 05/20/24 05/20/25 Yes [provider]  metFORMIN  (GLUCOPHAGE ) 1000 MG tablet Take 1,000 mg by mouth 2 (two) times daily with a meal.   Yes [provider]  nadolol (CORGARD) 20 MG tablet Take 20 mg by mouth daily. 08/12/24 08/12/25 Yes [provider]  pantoprazole  (PROTONIX ) 40 MG tablet Take 40 mg by mouth daily. 01/28/21  Yes [provider]  rosuvastatin  (CRESTOR ) 5 MG tablet Take 5 mg by mouth daily. 02/20/24 02/19/25 Yes [provider]  spironolactone  (ALDACTONE ) 25 MG tablet Take 0.5 tablets (12.5 mg total) by mouth daily. 03/31/24  Yes Fausto Sor A, DO  torsemide  (DEMADEX ) 20 MG tablet Take 1 tablet (20 mg total) by mouth daily as needed (for weight gain or swelling). 03/31/24  Yes Fausto Sor A, DO  carvedilol  (COREG ) 3.125 MG tablet Take 3.125 mg by mouth 2 (two) times daily with a meal.    [provider]  clopidogrel (PLAVIX) 75 MG tablet Take 75 mg by mouth. Take 1 tablet (75 mg total) by mouth once daily for 182 days Patient not taking: Reported on 08/14/2024 05/29/24 11/27/24  [provider]  loperamide  (IMODIUM  A-D) 2 MG tablet Take 2 tablets (4 mg total) by mouth 4 (four) times daily as needed for diarrhea or loose stools. Patient not taking: Reported on 06/07/2024 04/04/24   Lane Shope, MD    Past Medical History: Past Medical History:  Diagnosis Date   AC (acromioclavicular) joint bone spurs, right    Anemia    Arthritis    Atrial fibrillation (HCC)    a.) CHA2DS2-VASc = 7 (age, sex, HFrEF, HTN, TIA x 2, T2DM). b.) rate/rhythm controlled on  oral carvedilol ; chronically anticoagulated with warfarin.   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 b.) R/LHC 03/29/2017: small RCA with occluded SVG; no significant Dz. Chronically occluded LAD with patent SVG to D1/LAD. Insignificant Dz in LCx. LM normal.   Cortical cataract    DOE (dyspnea on exertion)    DOE (dyspnea on exertion)    GERD (gastroesophageal reflux disease)    HFrEF (heart failure with reduced ejection fraction) (HCC)    a.) TTE 10/27/2014: EF 40%; glob HK; mild BAE; triv AR, mild MR/PR, mod TR.  b.) TTE 03/15/2017: EF 30%; glob HK; mod BAE; mod pHTN (RVSP 54.8 mmHg); mild PR, mod MR; sev TR.  c.) R/LHC 03/29/2017: EF 30-35%. d.)TTE 10/25/2018: EF 35%; mod BAE;  mod BVE; glob HK; triv PR, mod MR, sev TR; RVSP 57.7 mmHg; G2DD.   History of shingles 2004   Hyperlipidemia    Hypertension    Hypothyroidism    IBS (irritable bowel syndrome)    Ischemic cardiomyopathy    a.) TTE 10/27/2014: EF 40%. b.) TTE 03/15/2017: EF 30%. c.) R/LHC 04/01/2017: EF 30-35%. d.) TTE 10/25/2018: EF 35%   Lower GI bleed 06/07/2023   Lumbar scoliosis    Lumbar spinal stenosis    Melena 12/17/2018   Migraines    Osteoporosis    Pancolitis (HCC) 02/28/2024   Pulmonary HTN (HCC)    a.) TTE 03/15/2017: EF 30-35%; RVSP 54.8 mmHg. 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   Sleep apnea    T2DM (type 2 diabetes mellitus) (HCC)    TIA (transient ischemic attack) 05/29/2016   Vitamin B 12 deficiency     Past Surgical History: Past Surgical History:  Procedure Laterality Date   CARDIAC CATHETERIZATION     CATARACT EXTRACTION W/ INTRAOCULAR LENS IMPLANT Bilateral    Cataract Extraction with IOL    COLECTOMY  02/28/2024   Procedure: COLECTOMY, TOTAL;  Surgeon: Lane Shope, MD;  Location: [facility] ORS;  Service: General;;   COLONOSCOPY N/A 12/19/2018   Procedure: COLONOSCOPY;  Surgeon: Janalyn Keene NOVAK, MD;  Location: [facility] ENDOSCOPY;  Service: Endoscopy;  Laterality: N/A;   COLONOSCOPY WITH PROPOFOL  N/A 06/17/2020   Procedure: COLONOSCOPY WITH PROPOFOL ;  Surgeon: Janalyn Keene NOVAK, MD;  Location: [facility] ENDOSCOPY;  Service: Endoscopy;  Laterality: N/A;   COLONOSCOPY WITH PROPOFOL  N/A 03/30/2023   Procedure: COLONOSCOPY WITH PROPOFOL ;  Surgeon: Onita Elspeth Sharper, DO;  Location: [facility] ENDOSCOPY;  Service: Endoscopy;  Laterality: N/A;   COR TRIATRIATUM REPAIR N/A 01/28/2004   CORONARY ARTERY BYPASS GRAFT N/A 01/28/2004   3v CABG   ESOPHAGOGASTRODUODENOSCOPY N/A 12/19/2018   Procedure: ESOPHAGOGASTRODUODENOSCOPY (EGD);  Surgeon: Janalyn Keene NOVAK, MD;  Location: [facility] ENDOSCOPY;  Service: Endoscopy;  Laterality: N/A;   ESOPHAGOGASTRODUODENOSCOPY N/A 06/11/2024   Procedure: EGD (ESOPHAGOGASTRODUODENOSCOPY);  Surgeon: Jinny Carmine, MD;  Location: [facility] ENDOSCOPY;  Service: Endoscopy;  Laterality: N/A;   ESOPHAGOGASTRODUODENOSCOPY N/A 07/18/2024   Procedure: EGD (ESOPHAGOGASTRODUODENOSCOPY);  Surgeon: Onita Elspeth Sharper, DO;  Location: [facility] ENDOSCOPY;  Service: Gastroenterology;  Laterality: N/A;  with Enteroscopy        DM on Plavix   ESOPHAGOGASTRODUODENOSCOPY (EGD) WITH PROPOFOL  N/A 06/17/2020   Procedure: ESOPHAGOGASTRODUODENOSCOPY (EGD) WITH PROPOFOL ;  Surgeon: Janalyn Keene NOVAK, MD;  Location: [facility] ENDOSCOPY;  Service: Endoscopy;  Laterality: N/A;   ESOPHAGOGASTRODUODENOSCOPY (EGD) WITH PROPOFOL  N/A 03/30/2023   Procedure: ESOPHAGOGASTRODUODENOSCOPY (EGD) WITH PROPOFOL ;  Surgeon: Onita Elspeth Sharper, DO;  Location: [facility] ENDOSCOPY;  Service: Endoscopy;  Laterality: N/A;   GIVENS CAPSULE STUDY N/A 07/21/2024   Procedure: IMAGING PROCEDURE, GI TRACT, INTRALUMINAL, VIA CAPSULE;   Surgeon: Therisa Bi, MD;  Location: [facility] ENDOSCOPY;  Service: Gastroenterology;  Laterality: N/A;   LAPAROTOMY N/A 02/28/2024   Procedure: LAPAROTOMY, EXPLORATORY;  Surgeon: Lane Shope, MD;  Location: [facility] ORS;  Service: General;  Laterality: N/A;   LEFT ATRIAL APPENDAGE OCCLUSION     RIGHT/LEFT HEART CATH AND CORONARY ANGIOGRAPHY Bilateral 03/29/2017   Procedure: Right/Left Heart Cath and Coronary Angiography;  Surgeon: Vinie DELENA Jude, MD;  Location: [facility] INVASIVE CV LAB;  Service: Cardiovascular;  Laterality: Bilateral;   TOTAL KNEE ARTHROPLASTY Right 04/05/2022   Procedure: TOTAL  KNEE ARTHROPLASTY;  Surgeon: Kathlynn Sharper, MD;  Location: [facility] ORS;  Service: Orthopedics;  Laterality: Right;   TUBAL LIGATION     VENTRAL HERNIA REPAIR N/A 04/21/2017   Procedure: HERNIA REPAIR VENTRAL ADULT;  Surgeon: Claudene Larinda Bolder, MD;  Location: [facility] ORS;  Service: General;  Laterality: N/A;    Family History: Family History  Problem Relation Age of Onset   Valvular heart disease Mother    Diabetes Father    Cancer Sister        lung   Cancer Sister        cervical   Breast cancer Cousin     Social History: Social History   Socioeconomic History   Marital status: Widowed    Spouse name: Not on file   Number of children: Not on file   Years of education: Not on file   Highest education level: Not on file  Occupational History   Occupation: certified nursing assistant  Tobacco Use   Smoking status: Former    Current packs/day: 0.00    Types: Cigarettes    Quit date: 03/30/1999    Years since quitting: 25.3   Smokeless tobacco: Never  Vaping Use   Vaping status: Never Used  Substance and Sexual Activity   Alcohol use: No   Drug use: No   Sexual activity: Not Currently  Other Topics Concern   Not on file  Social History Narrative   Not on file   Social Drivers of Health   Financial Resource Strain: Medium Risk (05/20/2024)   Received from [facility]    Overall Financial Resource Strain (CARDIA)    Difficulty of Paying Living Expenses: Somewhat hard  Food Insecurity: No Food Insecurity (08/14/2024)   Hunger Vital Sign    Worried About Running Out of Food in the Last Year: Never true    Ran Out of Food in the Last Year: Never true  Transportation Needs: No Transportation Needs (08/14/2024)   PRAPARE - Administrator, Civil Service (Medical): No    Lack of Transportation (Non-Medical): No  Physical Activity: Not on file  Stress: Not on file  Social Connections: Moderately Integrated (08/14/2024)   Social Connection and Isolation Panel    Frequency of Communication with Friends and Family: Three times a week    Frequency of Social Gatherings with Friends and Family: Three times a week    Attends Religious Services: More than 4 times per year    Active Member of Clubs or Organizations: Yes    Attends Banker Meetings: More than 4 times per year    Marital Status: Widowed    Allergies:  Allergies  Allergen Reactions   Vioxx [Rofecoxib] Swelling    Objective:    Vital Signs:   Temp:  [97.6 F (36.4 C)-98.3 F (36.8 C)] 98 F (36.7 C) (09/18 0800) Pulse Rate:  [60-135] 62 (09/18 1200) Resp:  [10-28] 17 (09/18 1200) BP: (68-112)/(38-69) 104/63 (09/18 1200) SpO2:  [93 %-100 %] 100 % (09/18 1200) Weight:  [57.2 kg-58.1 kg] 58.1 kg (09/18 0500) Last BM Date : 08/14/24  Weight change: Filed Weights   08/14/24 1335 08/14/24 2006 08/15/24 0500  Weight: 57.2 kg 58.1 kg 58.1 kg    Intake/Output:   Intake/Output Summary (Last 24 hours) at 08/15/2024 1331 Last data filed at 08/15/2024 1200 Gross per 24 hour  Intake 3923.67 ml  Output 1825 ml  Net 2098.67 ml      Physical Exam  General:  Well appearing. No resp difficulty HEENT: normal Neck: supple. Prominent carotid pulsations, no definite JVD. Carotids 2+ bilat; no bruits. No lymphadenopathy or thyromegaly appreciated. Cor: PMI nondisplaced.  Regular rate & rhythm. No rubs, gallops.  1/6 HSM LLSB apex.  Lungs: clear Abdomen: soft, nontender, nondistended. No hepatosplenomegaly. No bruits or masses. Good bowel sounds. Extremities: no cyanosis, clubbing, rash, edema Neuro: alert & orientedx3, cranial nerves grossly intact. moves all 4 extremities w/o difficulty. Affect pleasant   Telemetry   ?NSR, difficult (personally reviewed)  EKG    Pending  Labs   Basic Metabolic Panel: Recent Labs  Lab 08/14/24 1356 08/15/24 0117  NA 134* 133*  K 3.6 4.4  CL 102 102  CO2 22 20*  GLUCOSE 110* 164*  BUN 31* 34*  CREATININE 1.68* 1.77*  CALCIUM  8.6* 7.9*  MG  --  1.2*  PHOS  --  2.7    Liver Function Tests: Recent Labs  Lab 08/14/24 1356  AST 34  ALT 16  ALKPHOS 98  BILITOT 1.0  PROT 5.9*  ALBUMIN  3.2*   No results for input(s): LIPASE, AMYLASE in the last 168 hours. No results for input(s): AMMONIA in the last 168 hours.  CBC: Recent Labs  Lab 08/14/24 1356 08/14/24 2028 08/14/24 2200 08/15/24 0120 08/15/24 0802  WBC 5.3  --  7.4  --  9.0  NEUTROABS 3.2  --   --   --  6.0  HGB 8.0* 7.9* 8.9* 9.0* 8.7*  HCT 24.9* 23.9* 27.5* 27.0* 26.3*  MCV 103.8*  --  96.5  --  94.3  PLT 95*  --  113*  --  144*    Cardiac Enzymes: No results for input(s): CKTOTAL, CKMB, CKMBINDEX, TROPONINI in the last 168 hours.  BNP: BNP (last 3 results) Recent Labs    03/17/24 1723 06/07/24 1929 08/15/24 1022  BNP 166.8* 125.4* 391.6*    ProBNP (last 3 results) No results for input(s): PROBNP in the last 8760 hours.   CBG: Recent Labs  Lab 08/14/24 1959 08/14/24 2311 08/15/24 0323 08/15/24 0732 08/15/24 1147  GLUCAP 97 154* 168* 170* 189*    Coagulation Studies: Recent Labs    08/14/24 1356  LABPROT 16.9*  INR 1.3*     Imaging   US  EKG SITE RITE Result Date: 08/15/2024 If Site Rite image not attached, placement could not be confirmed due to current cardiac rhythm.  US  EKG SITE  RITE Result Date: 08/15/2024 If Site Rite image not attached, placement could not be confirmed due to current cardiac rhythm.  CT Angio Abd/Pel W and/or Wo Contrast Result Date: 08/14/2024 CLINICAL DATA:  Lower GI bleed. EXAM: CTA ABDOMEN AND PELVIS WITHOUT AND WITH CONTRAST TECHNIQUE: Multidetector CT imaging of the abdomen and pelvis was performed using the standard protocol during bolus administration of intravenous contrast. Multiplanar reconstructed images and MIPs were obtained and reviewed to evaluate the vascular anatomy. RADIATION DOSE REDUCTION: This exam was performed according to the departmental dose-optimization program which includes automated exposure control, adjustment of the mA and/or kV according to patient size and/or use of iterative reconstruction technique. CONTRAST:  80mL OMNIPAQUE  IOHEXOL  350 MG/ML SOLN COMPARISON:  CT dated 07/19/2024. FINDINGS: VASCULAR Aorta: Moderate atherosclerotic calcification of the thoracic aorta. No adrenal dilatation or dissection. No periaortic fluid collection. Celiac: Atherosclerotic calcification of the origin of the celiac trunk. The celiac artery is patent. SMA: Atherosclerotic calcification of the origin of the SMA. The SMA is patent. Renals: Atherosclerotic calcification of the renal artery  ostia. There is luminal narrowing with diminished flow in the renal arteries. IMA: The origin of the IMA is not visualized. There is reconstitution of flow within the IMA. Inflow: Mild atherosclerotic calcification of the iliac arteries. No aneurysmal dilatation or dissection. The iliac arteries are patent. Proximal Outflow: The visualized proximal of fluid patent. Veins: The IVC is unremarkable. The SMV, splenic vein, and main portal vein are patent. No portal venous gas. Review of the MIP images confirms the above findings. NON-VASCULAR Lower chest: Trace right pleural effusion. The visualized lung bases are clear. Mild cardiomegaly. No intra-abdominal free air.   Small ascites. Hepatobiliary: Cirrhosis. Multiple gallstones. The gallbladder is contracted. Pancreas: Unremarkable. No pancreatic ductal dilatation or surrounding inflammatory changes. Spleen: Normal in size without focal abnormality. Adrenals/Urinary Tract: The adrenal glands unremarkable. There is no hydronephrosis or nephrolithiasis on either side. The visualized ureters and urinary bladder appear unremarkable. 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 Small parastomal hernia. No evidence of bowel obstruction. Residual oral contrast noted in the stomach. There is diffuse enhancement of the small bowel mucosa. No evidence of active GI bleed. Appendectomy. Lymphatic: No adenopathy. Reproductive: No suspicious pelvic mass. Other: Midline vertical anterior pelvic wall incisional scar and subcutaneous edema. No fluid collection. Musculoskeletal: Degenerative changes of the spine and scoliosis. No acute osseous pathology. IMPRESSION: 1. No evidence of active GI bleed. 2. 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 No evidence of bowel obstruction. 3. Cirrhosis with small ascites. 4. Cholelithiasis. 5.  Aortic Atherosclerosis (ICD10-I70.0). Electronically Signed   By: Vanetta Chou M.D.   On: 08/14/2024 16:25     Medications:     Current Medications:  Chlorhexidine  Gluconate Cloth  6 each Topical Daily   insulin  aspart  0-5 Units Subcutaneous QHS   insulin  aspart  0-9 Units Subcutaneous TID WC   latanoprost   1 drop Both Eyes QHS   levothyroxine   50 mcg Oral Q0600   mupirocin  ointment  1 Application Nasal BID   pantoprazole  (PROTONIX ) IV  40 mg Intravenous Q12H   vancomycin   500 mg Oral Q6H    Infusions:  metronidazole  500 mg (08/15/24 1314)   norepinephrine  (LEVOPHED ) Adult infusion 9 mcg/min (08/15/24 1321)      Assessment/Plan   1. Shock: Suspect mixed hemorrhagic, cardiogenic, septic/distributive shock. Lactate  2.5 today and now on NE 9 with stable MAP.  Baseline ischemic cardiomyopathy, last echo showed EF 30%, normal RV size/systolic function, s/p mTEER with 1 XTW Mitraclip with residual moderate-severe MR, severe TR, severe biatrial enlargement, PASP 58 mmHg. She also was admitted with GI bleeding requiring transfusion and was positive for C difficile again and may have a component of distributive shock.  Exam is difficult for volume but she does not appear markedly volume overloaded.  - Repeat echo.  - Continue treatment for C difficile.  - Wean NE as tolerated, will trend lactate.  - I will place PICC line for NE infusion and to follow CVP and co-ox.  Can add dobutamine  low dose if co-ox is low.  2. Acute on chronic systolic CHF:  Ischemic cardiomyopathy.  Echo in 6/25 showed  EF 30%, normal RV size/systolic function, s/p mTEER with 1 XTW Mitraclip with residual moderate-severe MR, severe TR, severe biatrial enlargement, PASP 58 mmHg.  She does not appear significantly volume up.  GDMT has been limited historically by low BP, she has had to take midodrine  at times.  - Wean NE as tolerated.  -  As above, PICC to follow CVP and co-ox.  - No diuresis until CVP assessed.  - Hold Jardiance  and spironolactone  for now.  - I will arrange for repeat echo.  3. Atrial fibrillation: Rhythm is difficult today, not tachycardic and relatively regular. No anticoagulation given Watchman. She is now off Plavix as well with GI bleeding.  - ECG to confirm rhythm.  4. AKI: Creatinine up to 1.77, baseline 1.1.  Suspect due to shock.  5. CAD: S/p CABG in 2005. No chest pain, troponin minimally elevated (suspect demand ischemia with hypotension).  6. Cirrhosis: NAFLD with portal hypertension (esophageal varices).  7. GI bleeding: In setting of AVMs and portal hypertension.  Recurrent GI bleed this admission.  She is not on any antiplatelet or anticoagulant meds at this time.  - Transfuse hgb < 8 - Octreotide  infusion per  primary team.  8. Valvular heart disease: S/p mTEER, had residual moderate-severe MR as well as severe TR on last echo.  9. C difficile colitis: Fulminant C difficile infection in 4/25 with colectomy/end ileostomy. She is C difficile positive again this admission.  - Vancomycin /Flagyl  per primary.   CRITICAL CARE Performed by: Ezra Shuck  Total critical care time: 75 minutes  Critical care time was exclusive of separately billable procedures and treating other patients.  Critical care was necessary to treat or prevent imminent or life-threatening deterioration.  Critical care was time spent personally by me on the following activities: development of treatment plan with patient and/or surrogate as well as nursing, discussions with consultants, evaluation of patient's response to treatment, examination of patient, obtaining history from patient or surrogate, ordering and performing treatments and interventions, ordering and review of laboratory studies, ordering and review of radiographic studies, pulse oximetry and re-evaluation of patient's condition.   Length of Stay: 1  Ezra Shuck, MD  08/15/2024, 1:31 PM    Advanced Heart Failure Team Pager [phone] (M-F; 7a - 5p)  Please contact [facility] for night-coverage after hours (4p -7a ) and weekends on [email or web address]

## 2024-08-15 NOTE — Plan of Care (Signed)

## 2024-08-15 NOTE — Progress Notes (Signed)
 [name], MD [facility]   [address], [address] [place], [place] [address] Phone: [phone] Fax : [phone]   Subjective: The patient was admitted with a GI bleed.  The patient has had the same sewed of bleeding in the past with blood in her ostomy bag.  The patient has had a workup in the past for this without a cause being found.  The patient has a history of cirrhosis.  She also had a EGD with a capsule study at the last admission.  She has had no further signs of any GI bleeding since admission.  The patient has been found to have C. difficile positive.   Objective: Vital signs in last 24 hours: Vitals:   08/15/24 1245 08/15/24 1300 08/15/24 1315 08/15/24 1330  BP: (!) 106/40 (!) 107/40 99/65 109/89  Pulse: 69 60 61 (!) 117  Resp: 17 (!) 26 18 19   Temp:      TempSrc:      SpO2: (!) 62% 100% 100% 100%  Weight:      Height:       Weight change:   Intake/Output Summary (Last 24 hours) at 08/15/2024 1444 Last data filed at 08/15/2024 1300 Gross per 24 hour  Intake 3957.48 ml  Output 2025 ml  Net 1932.48 ml     Exam: Patient is sitting up in bed in no apparent distress without any complaints at present time alert and oriented x 3   Lab Results: @LABTEST2 @ Micro Results: Recent Results (from the past 240 hours)  MRSA Next Gen by PCR, Nasal     Status: Abnormal   Collection Time: 08/14/24  8:08 PM   Specimen: Nasal Mucosa; Nasal Swab  Result Value Ref Range Status   MRSA by PCR Next Gen DETECTED (A) NOT DETECTED Final    Comment: RESULT CALLED TO, READ BACK BY AND VERIFIED WITH:  HOLLY CHANDLER AT 2313 08/14/24 JG (NOTE) The GeneXpert MRSA Assay (FDA approved for NASAL specimens only), is one component of a comprehensive MRSA colonization surveillance program. It is not intended to diagnose MRSA infection nor to guide or monitor treatment for MRSA infections. Test performance is not FDA approved in patients less than 28 years old. Performed at [facility], [address] [place], [place] [address]   C Difficile Quick Screen w PCR reflex     Status: Abnormal   Collection Time: 08/15/24  9:29 AM   Specimen: STOOL  Result Value Ref Range Status   C Diff antigen POSITIVE (A) NEGATIVE Final   C Diff toxin POSITIVE (A) NEGATIVE Final   C Diff interpretation Toxin producing C. difficile detected.  Final    Comment: CRITICAL RESULT CALLED TO, READ BACK BY AND VERIFIED WITH: MEDFORD MATT RN 1027 08/15/24 HNM Performed at [facility], [address] [place], [place] [address]    Studies/Results: US  EKG SITE RITE Result Date: 08/15/2024 If [facility] image not attached, placement could not be confirmed due to current cardiac rhythm.  US  EKG SITE RITE Result Date: 08/15/2024 If Site Rite image not attached, placement could not be confirmed due to current cardiac rhythm.  CT Angio Abd/Pel W and/or Wo Contrast Result Date: 08/14/2024 CLINICAL DATA:  Lower GI bleed. EXAM: CTA ABDOMEN AND PELVIS WITHOUT AND WITH CONTRAST TECHNIQUE: Multidetector CT imaging of the abdomen and pelvis was performed using the standard protocol during bolus administration of intravenous contrast. Multiplanar reconstructed images and MIPs were obtained and reviewed to evaluate the vascular anatomy.  RADIATION DOSE REDUCTION: This exam was performed according to the departmental dose-optimization program which includes automated exposure control, adjustment of the mA and/or kV according to patient size and/or use of iterative reconstruction technique. CONTRAST:  80mL OMNIPAQUE  IOHEXOL  350 MG/ML SOLN COMPARISON:  CT dated 07/19/2024. FINDINGS: VASCULAR Aorta: Moderate atherosclerotic calcification of the thoracic aorta. No adrenal dilatation or dissection. No periaortic fluid collection. Celiac: Atherosclerotic calcification of the origin of the celiac trunk. The celiac artery is patent. SMA: Atherosclerotic calcification of the origin of the SMA. The SMA is  patent. Renals: Atherosclerotic calcification of the renal artery ostia. There is luminal narrowing with diminished flow in the renal arteries. IMA: The origin of the IMA is not visualized. There is reconstitution of flow within the IMA. Inflow: Mild atherosclerotic calcification of the iliac arteries. No aneurysmal dilatation or dissection. The iliac arteries are patent. Proximal Outflow: The visualized proximal of fluid patent. Veins: The IVC is unremarkable. The SMV, splenic vein, and main portal vein are patent. No portal venous gas. Review of the MIP images confirms the above findings. NON-VASCULAR Lower chest: Trace right pleural effusion. The visualized lung bases are clear. Mild cardiomegaly. No intra-abdominal free air.  Small ascites. Hepatobiliary: Cirrhosis. Multiple gallstones. The gallbladder is contracted. Pancreas: Unremarkable. No pancreatic ductal dilatation or surrounding inflammatory changes. Spleen: Normal in size without focal abnormality. Adrenals/Urinary Tract: The adrenal glands unremarkable. There is no hydronephrosis or nephrolithiasis on either side. The visualized ureters and urinary bladder appear unremarkable. 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 Small parastomal hernia. No evidence of bowel obstruction. Residual oral contrast noted in the stomach. There is diffuse enhancement of the small bowel mucosa. No evidence of active GI bleed. Appendectomy. Lymphatic: No adenopathy. Reproductive: No suspicious pelvic mass. Other: Midline vertical anterior pelvic wall incisional scar and subcutaneous edema. No fluid collection. Musculoskeletal: Degenerative changes of the spine and scoliosis. No acute osseous pathology. IMPRESSION: 1. No evidence of active GI bleed. 2. 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 No evidence of bowel obstruction. 3. Cirrhosis with small ascites. 4. Cholelithiasis. 5.  Aortic  Atherosclerosis (ICD10-I70.0). Electronically Signed   By: Vanetta Chou M.D.   On: 08/14/2024 16:25   Medications: I have reviewed the patient's current medications. Scheduled Meds:  Chlorhexidine  Gluconate Cloth  6 each Topical Daily   insulin  aspart  0-5 Units Subcutaneous QHS   insulin  aspart  0-9 Units Subcutaneous TID WC   latanoprost   1 drop Both Eyes QHS   levothyroxine   50 mcg Oral Q0600   mupirocin  ointment  1 Application Nasal BID   pantoprazole  (PROTONIX ) IV  40 mg Intravenous Q12H   vancomycin   500 mg Oral Q6H   Continuous Infusions:  metronidazole  500 mg (08/15/24 1314)   norepinephrine  (LEVOPHED ) Adult infusion 9 mcg/min (08/15/24 1321)   PRN Meds:.docusate sodium , mouth rinse, polyethylene glycol   Assessment: Principal Problem:   Hemorrhagic shock (HCC)    Plan: With C. difficile positive findings and GI bleeding and the drop in her hemoglobin.  The patient was admitted with hypotension and GI bleed and was found to have C. difficile.  She has not had any further sign of GI bleeding.  I have discussed with the ICU team that if she has any further bleeding that localization of the source is paramount to finding out how we can treated.  If she does have rebleeding and the bleeding source is found and amenable to endoscopic therapy then  we will not hesitate to pursue that.  If the site is not reachable by luminal evaluation then the patient may need vascular surgery intervention.   LOS: 1 day   [name], MD.FACG 08/15/2024, 2:44 PM Pager [phone] 7am-5pm  Check AMION for 5pm -7am coverage and on weekends

## 2024-08-15 NOTE — Progress Notes (Signed)
 PICC was approved for use by Dr. Isadora.

## 2024-08-15 NOTE — Progress Notes (Signed)
 NAME:  Tammy Arias, MRN:  [id number], DOB:  02/06/1956, LOS: 1 ADMISSION DATE:  08/14/2024, CONSULTATION DATE:  08/14/24 REFERRING MD:  Dr. Dicky, CHIEF COMPLAINT:  GI bleeding   History of Present Illness:  68 yo F presenting to [facility] ED from home for evaluation of GI bleeding.  History obtained through chart review and patient bedside report. Patient reports noting blood mixed with her stool in her ileostomy bag for an unknown amount of time. Then on 9/17 she noted bright red blood and a large clot when emptying the bag. This bleeding had not stopped causing her to come in for evaluation. She reports generalized fatigue and headache just today. She denies blurred vision, vomiting/ nausea/ acute abdominal pain, dysuria, chest pain, cough, dizziness, falls or LOC. She endorses chronic dyspnea and chronic RUQ tenderness due to a hernia and post surgical changes after her ileostomy was placed. She also reports being changed to Nadolol from metoprolol  and that she had been checking her BP at home because of this change, noting it was low today. Because of this she took a midodrine  this morning.  Of note patient has had previous episodes of bleeding requiring hospitalization and resuscitation. She has undergone extensive GI work up including EGD and capsule studies without identification of a source. Previous CT GI bleed imaging from 07/19/24 identified extensive venous collaterals raising suspicion that this might be the source in the setting of liver cirrhosis, esophageal varices and AVM's. Bleeding has previously stopped and started without specific intervention. She is not on chronic anticoagulation and Plavix was stopped after most recent hospitalization in August. She does endorse taking a baby aspirin  daily. Patient also has chronic anemia, receiving iron  infusions and has chronic pancytopenia. She was scheduled for her first outpatient consultation with heme/onc for her pancytopenia next week. She  was also recently started on protonix  BID.  ED course: Upon arrival patient alert and oriented, pale with soft BP. Labs significant for acutely dropped Hgb, Hgb on 9/4 was 9.2 > now 8.0.  Medications given: Octreotide  bolus followed by infusion, protonix , 1 L IVF bolus, IV contrast Initial Vitals: 97.7, 17, 70, 90/40 & 100% on RA Significant labs: (Labs/ Imaging personally reviewed) Chemistry: Na+: 134, K+: 3.6, BUN/Cr.: 31/1.68, Serum CO2/ AG: 22/10 Hematology: WBC: 5.3, Hgb: 8.0, platelets: 95  CT angio abdomen/pelvis w/wo contrast 08/14/24:  No evidence of active GI bleed. 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 No evidence of bowel obstruction. Cirrhosis with small ascites. Cholelithiasis.  Aortic Atherosclerosis  PCCM consulted for admission due to circulatory shock secondary to acute blood loss anemia in the setting of pancytopenia and chronic GIB requiring vasopressor support.  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 08/15/24: Pts hgb remains stable following transfusion of 2 units pRBC's, however she's still requiring levophed  gtt @  9 mcg/min to maintain map >65.  Per GI recommendations  if she has active bleeding will place order for tagged bleeding scan   Interim History / Subjective:  No acute events overnight   Objective    Blood pressure (!) 100/53, pulse 62, temperature 97.8 F (36.6 C), temperature source Oral, resp. rate 14, height 5' 1 (1.549 m), weight 58.1 kg, SpO2 96%.        Intake/Output Summary (Last 24 hours) at 08/15/2024 0724 Last data filed at 08/15/2024 0700 Gross per 24 hour  Intake 3235.8 ml  Output 1575 ml  Net 1660.8 ml   Filed Weights   08/14/24 1335 08/14/24 2006 08/15/24 0500  Weight: 57.2 kg 58.1 kg 58.1 kg    Examination: General: Acutely-ill appearing female, NAD on RA HEENT: MM pink/moist, anicteric, atraumatic, neck supple Neuro: A&O x 4, follows commands, PERRLA  CV: NSR, s1s2, no m/r/g, 2+ radial/2+ distal pulses, no edema  Pulm: Clear throughout, even, non labored GI: +BS x4, soft, rounded, ostomy in place, mild chronic tenderness at RUQ hernia  Skin: Scattered ecchymosis and excoriation from scratching noted Extremities: Warm/dry, moves all extremities   Resolved problem list   Assessment and Plan  #Acute gastrointestinal bleed secondary to unknown etiology #Circulatory shock s/t ABLA #Chronic pancytopenia #Acute on chronic anemia  PMHx: liver cirrhosis with esophageal varices and portal hypertension - CBC and coags daily  - H&H q6hrs  - Transfuse for Hgb <8 due to cardiac Hx and ongoing bleeding.   - PPI BID - Octreotide  gtt discontinued  - Prn levophed  gtt to maintain map >65 - Clear liquid diet advance as tolerated  - GI & Vascular consulted, appreciate input: both recommend resuscitation and tagged bleeding scan if bleeding continues  #Chronic HFrEF without exacerbation #CAD s/p CABG #Circulatory shock: hemorrhagic and possible cardiogenic shock  #HLD #PAF s/p watchman device #Possible cardiorenal syndrome   Hx: HTN, pulmonary HTN, and mitral/tricuspid regurgitation - Continuous telemetry monitoring  -  Troponin pending  - Echo pending  - Heart failure team consulted appreciate input  - Hold outpatient GDMT for now  - Pending PICC line placement once placed will order coox and monitor CVP's  #OSA on CPAP Hx: Former smoker - CPAP at bedtime - Supplemental O2 PRN to maintain SpO2 > 90%  #Acute kidney injury superimposed on CKD stage 3b suspect pre-renal secondary to hemorrhagic shock Baseline Cr: 0.88, Cr on admission:1.68 - Trend BMP  - Replace electrolytes as indicated  - Strict I&O's  - Avoid nephrotoxic agents as able   #Type 2 diabetes mellitus Hemoglobin A1C: 5.9 - CBG's ac/hs - SSI  - Target CBG reading 140 to 180 - Follow ICU hyper/hypo-glycemia protocol  #Hypothyroidism - Resume outpatient synthroid    Labs   CBC: Recent Labs  Lab 08/14/24 1356 08/14/24 2028 08/14/24 2200 08/15/24 0120  WBC 5.3  --  7.4  --   NEUTROABS 3.2  --   --   --   HGB 8.0* 7.9* 8.9* 9.0*  HCT 24.9* 23.9* 27.5* 27.0*  MCV 103.8*  --  96.5  --   PLT 95*  --  113*  --     Basic Metabolic Panel: Recent Labs  Lab 08/14/24 1356 08/15/24 0117  NA 134* 133*  K 3.6 4.4  CL 102 102  CO2 22 20*  GLUCOSE 110* 164*  BUN 31* 34*  CREATININE 1.68* 1.77*  CALCIUM  8.6* 7.9*  MG  --  1.2*  PHOS  --  2.7  GFR: Estimated Creatinine Clearance: 24.6 mL/min (A) (by C-G formula based on SCr of 1.77 mg/dL (H)). Recent Labs  Lab 08/14/24 1356 08/14/24 2200  WBC 5.3 7.4    Liver Function Tests: Recent Labs  Lab 08/14/24 1356  AST 34  ALT 16  ALKPHOS 98  BILITOT 1.0  PROT 5.9*  ALBUMIN  3.2*   No results for input(s): LIPASE, AMYLASE in the last 168 hours. No results for input(s): AMMONIA in the last 168 hours.  ABG    Component Value Date/Time   PHART 7.47 (H) 03/01/2024 1055   PCO2ART 40 03/01/2024 1055   PO2ART 112 (H) 03/01/2024 1055   HCO3 29.1 (H) 03/01/2024 1055   ACIDBASEDEF 2.7 (H) 02/29/2024 0952   O2SAT 100 03/02/2024 0813     Coagulation  Profile: Recent Labs  Lab 08/14/24 1356  INR 1.3*    Cardiac Enzymes: No results for input(s): CKTOTAL, CKMB, CKMBINDEX, TROPONINI in the last 168 hours.  HbA1C: Hgb A1c MFr Bld  Date/Time Value Ref Range Status  02/28/2024 06:08 AM 5.9 (H) 4.8 - 5.6 % Final    Comment:    (NOTE) Pre diabetes:          5.7%-6.4%  Diabetes:              >6.4%  Glycemic control for   <7.0% adults with diabetes   06/07/2023 02:58 AM 6.3 (H) 4.8 - 5.6 % Final    Comment:    (NOTE)         Prediabetes: 5.7 - 6.4         Diabetes: >6.4         Glycemic control for adults with diabetes: <7.0     CBG: Recent Labs  Lab 08/14/24 2311 08/15/24 0323  GLUCAP 154* 168*    Review of Systems: Positives in BOLD  Gen: Denies fever, chills, weight change, fatigue, night sweats HEENT: Denies blurred vision, double vision, hearing loss, tinnitus, sinus congestion, rhinorrhea, sore throat, neck stiffness, dysphagia PULM: Denies shortness of breath, cough, sputum production, hemoptysis, wheezing CV: Denies chest pain, edema, orthopnea, paroxysmal nocturnal dyspnea, palpitations GI: Denies abdominal pain, nausea, vomiting, diarrhea, hematochezia, melena, constipation, change in bowel habits GU: Denies dysuria, hematuria, polyuria, oliguria, urethral discharge Endocrine: Denies hot or cold intolerance, polyuria, polyphagia or appetite change Derm: Denies rash, dry skin, scaling or peeling skin change Heme: Denies easy bruising, bleeding, bleeding gums Neuro: Denies headache, numbness, weakness, slurred speech, loss of memory or consciousness  Past Medical History:  She,  has a past medical history of AC (acromioclavicular) joint bone spurs, right, Anemia, Arthritis, Atrial fibrillation (HCC), Cardiac murmur, CHF (congestive heart failure) (HCC), Chronic anticoagulation, Clostridium difficile colitis (02/28/2024), Cor triatriatum, Coronary artery disease, Cortical cataract, DOE (dyspnea on  exertion), DOE (dyspnea on exertion), GERD (gastroesophageal reflux disease), HFrEF (heart failure with reduced ejection fraction) (HCC), History of shingles (2004), Hyperlipidemia, Hypertension, Hypothyroidism, IBS (irritable bowel syndrome), Ischemic cardiomyopathy, Lower GI bleed (06/07/2023), Lumbar scoliosis, Lumbar spinal stenosis, Melena (12/17/2018), Migraines, Osteoporosis, Pancolitis (HCC) (02/28/2024), Pulmonary HTN (HCC), S/P CABG x 3 (01/28/2004), Sleep apnea, T2DM (type 2 diabetes mellitus) (HCC), TIA (transient ischemic attack) (05/29/2016), and Vitamin B 12 deficiency.   Surgical History:   Past Surgical History:  Procedure Laterality Date   CARDIAC CATHETERIZATION     CATARACT EXTRACTION W/ INTRAOCULAR LENS IMPLANT Bilateral    Cataract Extraction with IOL   COLECTOMY  02/28/2024   Procedure: COLECTOMY, TOTAL;  Surgeon: Lane Shope, MD;  Location: [facility] ORS;  Service: General;;   COLONOSCOPY N/A 12/19/2018   Procedure: COLONOSCOPY;  Surgeon: Janalyn Keene NOVAK, MD;  Location: [facility] ENDOSCOPY;  Service: Endoscopy;  Laterality: N/A;   COLONOSCOPY WITH PROPOFOL  N/A 06/17/2020   Procedure: COLONOSCOPY WITH PROPOFOL ;  Surgeon: Janalyn Keene NOVAK, MD;  Location: [facility] ENDOSCOPY;  Service: Endoscopy;  Laterality: N/A;   COLONOSCOPY WITH PROPOFOL  N/A 03/30/2023   Procedure: COLONOSCOPY WITH PROPOFOL ;  Surgeon: Onita Elspeth Sharper, DO;  Location: [facility] ENDOSCOPY;  Service: Endoscopy;  Laterality: N/A;   COR TRIATRIATUM REPAIR N/A 01/28/2004   CORONARY ARTERY BYPASS GRAFT N/A 01/28/2004   3v CABG   ESOPHAGOGASTRODUODENOSCOPY N/A 12/19/2018   Procedure: ESOPHAGOGASTRODUODENOSCOPY (EGD);  Surgeon: Janalyn Keene NOVAK, MD;  Location: [facility] ENDOSCOPY;  Service: Endoscopy;  Laterality: N/A;   ESOPHAGOGASTRODUODENOSCOPY N/A 06/11/2024   Procedure: EGD (ESOPHAGOGASTRODUODENOSCOPY);  Surgeon: Jinny Carmine, MD;  Location: [facility] ENDOSCOPY;  Service: Endoscopy;  Laterality: N/A;    ESOPHAGOGASTRODUODENOSCOPY N/A 07/18/2024   Procedure: EGD (ESOPHAGOGASTRODUODENOSCOPY);  Surgeon: Onita Elspeth Sharper, DO;  Location: [facility] ENDOSCOPY;  Service: Gastroenterology;  Laterality: N/A;  with Enteroscopy        DM on Plavix   ESOPHAGOGASTRODUODENOSCOPY (EGD) WITH PROPOFOL  N/A 06/17/2020   Procedure: ESOPHAGOGASTRODUODENOSCOPY (EGD) WITH PROPOFOL ;  Surgeon: Janalyn Keene NOVAK, MD;  Location: [facility] ENDOSCOPY;  Service: Endoscopy;  Laterality: N/A;   ESOPHAGOGASTRODUODENOSCOPY (EGD) WITH PROPOFOL  N/A 03/30/2023   Procedure: ESOPHAGOGASTRODUODENOSCOPY (EGD) WITH PROPOFOL ;  Surgeon: Onita Elspeth Sharper, DO;  Location: [facility] ENDOSCOPY;  Service: Endoscopy;  Laterality: N/A;   GIVENS CAPSULE STUDY N/A 07/21/2024   Procedure: IMAGING PROCEDURE, GI TRACT, INTRALUMINAL, VIA CAPSULE;  Surgeon: Therisa Bi, MD;  Location: [facility] ENDOSCOPY;  Service: Gastroenterology;  Laterality: N/A;   LAPAROTOMY N/A 02/28/2024   Procedure: LAPAROTOMY, EXPLORATORY;  Surgeon: Lane Shope, MD;  Location: [facility] ORS;  Service: General;  Laterality: N/A;   LEFT ATRIAL APPENDAGE OCCLUSION     RIGHT/LEFT HEART CATH AND CORONARY ANGIOGRAPHY Bilateral 03/29/2017   Procedure: Right/Left Heart Cath and Coronary Angiography;  Surgeon: Vinie DELENA Jude, MD;  Location: [facility] INVASIVE CV LAB;  Service: Cardiovascular;  Laterality: Bilateral;   TOTAL KNEE ARTHROPLASTY Right 04/05/2022   Procedure: TOTAL KNEE ARTHROPLASTY;  Surgeon: Kathlynn Sharper, MD;  Location: [facility] ORS;  Service: Orthopedics;  Laterality: Right;   TUBAL LIGATION     VENTRAL HERNIA REPAIR N/A 04/21/2017   Procedure: HERNIA REPAIR VENTRAL ADULT;  Surgeon: Claudene Larinda Bolder, MD;  Location: [facility] ORS;  Service: General;  Laterality: N/A;     Social History:   reports that she quit smoking about 25 years ago. Her smoking use included cigarettes. She has never used smokeless tobacco. She reports that she does not drink alcohol and does not use drugs.   Family  History:  Her family history includes Breast cancer in her cousin; Cancer in her sister and sister; Diabetes in her father; Valvular heart disease in her mother.   Allergies Allergies  Allergen Reactions   Vioxx [Rofecoxib] Swelling     Home Medications  Prior to Admission medications   Medication Sig Start Date End Date Taking? Authorizing Provider  albuterol  (PROVENTIL  HFA;VENTOLIN  HFA) 108 (90 Base) MCG/ACT inhaler Inhale 2 puffs into the lungs every 6 (six) hours as needed for wheezing or shortness of breath.   Yes [provider]  allopurinol  (ZYLOPRIM ) 300 MG tablet Take 300 mg by mouth daily.   Yes [provider]  aspirin  EC 81 MG tablet Take 81 mg by mouth daily. Swallow whole.   Yes [provider]  Blood Glucose Monitoring Suppl (ACCU-CHEK GUIDE ME) w/Device KIT  08/01/24  Yes [provider]  cyanocobalamin  (VITAMIN B12) 1000 MCG tablet Take 1,000 mcg by mouth daily.   Yes [provider]  empagliflozin  (JARDIANCE ) 10 MG TABS tablet Take 1 tablet by mouth daily. 04/17/24  Yes [provider]  fluticasone  (FLONASE ) 50 MCG/ACT nasal spray Place 2 sprays into the nose daily. 05/20/24  Yes [provider]  iron  polysaccharides (NIFEREX) 150 MG capsule Take 300 mg by mouth.  Take 2 capsules (300 mg total) by mouth once daily for 90 days 06/20/24 09/18/24 Yes [provider]  latanoprost  (XALATAN ) 0.005 % ophthalmic solution Place 1 drop into both eyes at bedtime.   Yes [provider]  levothyroxine  (SYNTHROID ) 50 MCG tablet Take 50 mcg by mouth daily before breakfast.   Yes [provider]  magnesium  oxide (MAG-OX) 400 MG tablet Take 400 mg by mouth. Take 1 tablet (400 mg total) by mouth once daily 05/20/24 05/20/25 Yes [provider]  metFORMIN  (GLUCOPHAGE ) 1000 MG tablet Take 1,000 mg by mouth 2 (two) times daily with a meal.   Yes [provider]  nadolol (CORGARD) 20 MG tablet  Take 20 mg by mouth daily. 08/12/24 08/12/25 Yes [provider]  pantoprazole  (PROTONIX ) 40 MG tablet Take 40 mg by mouth daily. 01/28/21  Yes [provider]  rosuvastatin  (CRESTOR ) 5 MG tablet Take 5 mg by mouth daily. 02/20/24 02/19/25 Yes [provider]  spironolactone  (ALDACTONE ) 25 MG tablet Take 0.5 tablets (12.5 mg total) by mouth daily. 03/31/24  Yes Fausto Sor A, DO  torsemide  (DEMADEX ) 20 MG tablet Take 1 tablet (20 mg total) by mouth daily as needed (for weight gain or swelling). 03/31/24  Yes Fausto Sor A, DO  carvedilol  (COREG ) 3.125 MG tablet Take 3.125 mg by mouth 2 (two) times daily with a meal.    [provider]  clopidogrel (PLAVIX) 75 MG tablet Take 75 mg by mouth. Take 1 tablet (75 mg total) by mouth once daily for 182 days Patient not taking: Reported on 08/14/2024 05/29/24 11/27/24  [provider]  loperamide  (IMODIUM  A-D) 2 MG tablet Take 2 tablets (4 mg total) by mouth 4 (four) times daily as needed for diarrhea or loose stools. Patient not taking: Reported on 06/07/2024 04/04/24   Lane Shope, MD     Critical care time: 36 minutes      Lonell Moose, AGNP  Pulmonary/Critical Care Pager [phone] (please enter 7 digits) PCCM Consult Pager [phone] (please enter 7 digits)

## 2024-08-15 NOTE — Progress Notes (Signed)
 PHARMACY CONSULT NOTE - ELECTROLYTES  Pharmacy Consult for Electrolyte Monitoring and Replacement   Recent Labs: Height: 5' 1 (154.9 cm) Weight: 58.1 kg (128 lb 1.4 oz) IBW/kg (Calculated) : 47.8 Estimated Creatinine Clearance: 24.6 mL/min (A) (by C-G formula based on SCr of 1.77 mg/dL (H)). Potassium (mmol/L)  Date Value  08/15/2024 4.4   Magnesium  (mg/dL)  Date Value  90/81/7974 1.2 (L)   Calcium  (mg/dL)  Date Value  90/81/7974 7.9 (L)   Albumin  (g/dL)  Date Value  90/82/7974 3.2 (L)  05/18/2020 4.7   Phosphorus (mg/dL)  Date Value  90/81/7974 2.7   Sodium (mmol/L)  Date Value  08/15/2024 133 (L)   Corrected Ca: 9.2 mg/dL  Assessment  Tammy Arias is a 68 y.o. female presenting with bleeding from her ileostomy. PMH significant for ischemic cardiomyopathy . Pharmacy has been consulted to monitor and replace electrolytes.  Diet: NPO MIVF: NS @ 50 mL/hr  Pertinent medications: None  Goal of Therapy: K>4, Mag>2 given cardiac history  9/18: K 4.4, Mg 1.2, Phos 2.7  Plan:  Mg sulfate 4 g IV x 1 ordered by provider  Check BMP, Mg, Phos with AM labs  Thank you for allowing pharmacy to be a part of this patient's care.   Ransom Blanch PGY-1 Pharmacy Resident  [facility]  08/15/2024 7:20 AM

## 2024-08-15 NOTE — Progress Notes (Signed)
 Spoke to Lonell Blush NP re: chest X-ray result of LUA PICC line. CT scan recommended for further evaluation per radiology.

## 2024-08-15 NOTE — Progress Notes (Signed)
 Brief Progress Note  LUE PICC line placed by IV team post insertion CXR showing distal tip appeared to be to the left of the lower thoracic spine, with the more proximal portion of the catheter appearing in the lateral contours of the descending thoracic aorta making intra-aortic location less likely.  However, CTA showed the pt may have a left-sided SVC which enters inferior portion of right atrium.  ICU Intensivist reviewed CXR imaging and recommended transducing a CVP to determine if PICC line is venous vs arterial.  CVP reading 20-24 verifying LUE PICC line not in the artery it is correctly position in the SVC. Dr. Dgayli gave orders to RN ok to use PICC line.  Will continue to monitor and assess pt.   Tammy Arias, AGNP  Pulmonary/Critical Care Pager [phone] (please enter 7 digits) PCCM Consult Pager [phone] (please enter 7 digits)

## 2024-08-16 ENCOUNTER — Inpatient Hospital Stay

## 2024-08-16 ENCOUNTER — Telehealth (HOSPITAL_COMMUNITY): Payer: Self-pay | Admitting: Pharmacy Technician

## 2024-08-16 ENCOUNTER — Encounter: Payer: Self-pay | Admitting: Oncology

## 2024-08-16 ENCOUNTER — Inpatient Hospital Stay (HOSPITAL_COMMUNITY)
Admit: 2024-08-16 | Discharge: 2024-08-16 | Disposition: A | Discharge status: Home / Self Care | Attending: Critical Care Medicine | Admitting: Critical Care Medicine

## 2024-08-16 ENCOUNTER — Other Ambulatory Visit (HOSPITAL_COMMUNITY): Payer: Self-pay

## 2024-08-16 DIAGNOSIS — K922 Gastrointestinal hemorrhage, unspecified: Secondary | ICD-10-CM | POA: Diagnosis not present

## 2024-08-16 DIAGNOSIS — E440 Moderate protein-calorie malnutrition: Secondary | ICD-10-CM | POA: Insufficient documentation

## 2024-08-16 DIAGNOSIS — I4891 Unspecified atrial fibrillation: Secondary | ICD-10-CM | POA: Diagnosis not present

## 2024-08-16 DIAGNOSIS — I5020 Unspecified systolic (congestive) heart failure: Secondary | ICD-10-CM | POA: Diagnosis not present

## 2024-08-16 DIAGNOSIS — R578 Other shock: Secondary | ICD-10-CM | POA: Diagnosis not present

## 2024-08-16 DIAGNOSIS — I5023 Acute on chronic systolic (congestive) heart failure: Secondary | ICD-10-CM | POA: Diagnosis not present

## 2024-08-16 DIAGNOSIS — N179 Acute kidney failure, unspecified: Secondary | ICD-10-CM | POA: Diagnosis not present

## 2024-08-16 LAB — CBC WITH DIFFERENTIAL/PLATELET
Abs Immature Granulocytes: 0.02 K/uL (ref 0.00–0.07)
Basophils Absolute: 0 K/uL (ref 0.0–0.1)
Basophils Relative: 1 %
Eosinophils Absolute: 0.5 K/uL (ref 0.0–0.5)
Eosinophils Relative: 13 %
HCT: 23.7 % — ABNORMAL LOW (ref 36.0–46.0)
Hemoglobin: 8 g/dL — ABNORMAL LOW (ref 12.0–15.0)
Immature Granulocytes: 1 %
Lymphocytes Relative: 11 %
Lymphs Abs: 0.4 K/uL — ABNORMAL LOW (ref 0.7–4.0)
MCH: 31 pg (ref 26.0–34.0)
MCHC: 33.8 g/dL (ref 30.0–36.0)
MCV: 91.9 fL (ref 80.0–100.0)
Monocytes Absolute: 0.5 K/uL (ref 0.1–1.0)
Monocytes Relative: 12 %
Neutro Abs: 2.4 K/uL (ref 1.7–7.7)
Neutrophils Relative %: 62 %
Platelets: 70 K/uL — ABNORMAL LOW (ref 150–400)
RBC: 2.58 MIL/uL — ABNORMAL LOW (ref 3.87–5.11)
RDW: 19.3 % — ABNORMAL HIGH (ref 11.5–15.5)
Smear Review: NORMAL
WBC: 3.9 K/uL — ABNORMAL LOW (ref 4.0–10.5)
nRBC: 0 % (ref 0.0–0.2)

## 2024-08-16 LAB — BASIC METABOLIC PANEL WITH GFR
Anion gap: 5 (ref 5–15)
BUN: 33 mg/dL — ABNORMAL HIGH (ref 8–23)
CO2: 21 mmol/L — ABNORMAL LOW (ref 22–32)
Calcium: 7.4 mg/dL — ABNORMAL LOW (ref 8.9–10.3)
Chloride: 100 mmol/L (ref 98–111)
Creatinine, Ser: 1.19 mg/dL — ABNORMAL HIGH (ref 0.44–1.00)
GFR, Estimated: 49 mL/min — ABNORMAL LOW
Glucose, Bld: 205 mg/dL — ABNORMAL HIGH (ref 70–99)
Potassium: 3.9 mmol/L (ref 3.5–5.1)
Sodium: 128 mmol/L — ABNORMAL LOW (ref 135–145)

## 2024-08-16 LAB — LACTIC ACID, PLASMA: Lactic Acid, Venous: 0.7 mmol/L (ref 0.5–1.9)

## 2024-08-16 LAB — ECHOCARDIOGRAM COMPLETE
AR max vel: 1.61 cm2
AV Area VTI: 1.51 cm2
AV Area mean vel: 1.54 cm2
AV Mean grad: 4 mmHg
AV Peak grad: 6.9 mmHg
Ao pk vel: 1.31 m/s
Area-P 1/2: 1.87 cm2
Calc EF: 53.1 %
Height: 61 in
MV M vel: 5.06 m/s
MV Peak grad: 102.4 mmHg
MV VTI: 0.91 cm2
S' Lateral: 3.9 cm
Single Plane A2C EF: 58.9 %
Single Plane A4C EF: 50.3 %
Weight: 2201.07 [oz_av]

## 2024-08-16 LAB — PREPARE RBC (CROSSMATCH)

## 2024-08-16 LAB — HEMOGLOBIN AND HEMATOCRIT, BLOOD
HCT: 24.4 % — ABNORMAL LOW (ref 36.0–46.0)
HCT: 22.2 % — ABNORMAL LOW (ref 36.0–46.0)
HCT: 25.1 % — ABNORMAL LOW (ref 36.0–46.0)
Hemoglobin: 8.4 g/dL — ABNORMAL LOW (ref 12.0–15.0)
Hemoglobin: 7.7 g/dL — ABNORMAL LOW (ref 12.0–15.0)
Hemoglobin: 8.7 g/dL — ABNORMAL LOW (ref 12.0–15.0)

## 2024-08-16 LAB — PHOSPHORUS: Phosphorus: 3.1 mg/dL (ref 2.5–4.6)

## 2024-08-16 LAB — COOXEMETRY PANEL
Carboxyhemoglobin: 3 % — ABNORMAL HIGH (ref 0.5–1.5)
Methemoglobin: <0.7 % (ref 0.0–1.5)
O2 Saturation: 63 %
Total hemoglobin: 8.4 g/dL — ABNORMAL LOW (ref 12.0–16.0)
Total oxygen content: 61.1 %

## 2024-08-16 LAB — GLUCOSE, CAPILLARY
Glucose-Capillary: 137 mg/dL — ABNORMAL HIGH (ref 70–99)
Glucose-Capillary: 154 mg/dL — ABNORMAL HIGH (ref 70–99)
Glucose-Capillary: 284 mg/dL — ABNORMAL HIGH (ref 70–99)

## 2024-08-16 LAB — MAGNESIUM: Magnesium: 2.3 mg/dL (ref 1.7–2.4)

## 2024-08-16 MED ORDER — HYDROCORTISONE SOD SUC (PF) 100 MG IJ SOLR
50.0000 mg | Freq: Three times a day (TID) | INTRAMUSCULAR | Status: DC
  Administered 2024-08-16 – 2024-08-19 (×9): 50 mg via INTRAVENOUS
  Filled 2024-08-16 (×3): qty 1
  Filled 2024-08-16 (×2): qty 2
  Filled 2024-08-16: qty 1
  Filled 2024-08-16: qty 2
  Filled 2024-08-16: qty 1
  Filled 2024-08-16: qty 2
  Filled 2024-08-16: qty 1

## 2024-08-16 MED ORDER — TECHNETIUM TC 99M-LABELED RED BLOOD CELLS IV KIT
26.4900 | PACK | Freq: Once | INTRAVENOUS | Status: AC | PRN
Start: 2024-08-16 — End: 2024-08-16
  Administered 2024-08-16: 26.49 via INTRAVENOUS

## 2024-08-16 MED ORDER — SODIUM CHLORIDE 0.9% IV SOLUTION: Freq: Once | INTRAVENOUS | Status: DC

## 2024-08-16 MED ORDER — FUROSEMIDE 10 MG/ML IJ SOLN
80.0000 mg | Freq: Two times a day (BID) | INTRAMUSCULAR | Status: DC
  Administered 2024-08-16 – 2024-08-19 (×7): 80 mg via INTRAVENOUS
  Filled 2024-08-16 (×7): qty 8

## 2024-08-16 MED ORDER — POTASSIUM CHLORIDE 10 MEQ/100ML IV SOLN
10.0000 meq | Freq: Once | INTRAVENOUS | Status: DC
  Filled 2024-08-16: qty 100

## 2024-08-16 MED ORDER — POTASSIUM CHLORIDE 10 MEQ/100ML IV SOLN
10.0000 meq | INTRAVENOUS | Status: AC
Start: 2024-08-16 — End: 2024-08-16
  Administered 2024-08-16 (×2): 10 meq via INTRAVENOUS
  Filled 2024-08-16 (×2): qty 100

## 2024-08-16 NOTE — Progress Notes (Signed)
 NAME:  Tammy Arias, MRN:  [id number], DOB:  1956/03/10, LOS: 2 ADMISSION DATE:  08/14/2024, CONSULTATION DATE:  08/14/24 REFERRING MD:  Dr. Dicky , CHIEF COMPLAINT:  GI Bleeding   Brief Pt Description / Synopsis:  68 year old female with history of HFrEF, Afib (s/p watchman, now off antiplatelets) and recurrent GI bleed who presents to the hospital with weakness and blood through the stoma. She's previously had an extensive investigation (including EGD, capsule study, CTA) without definitive identification of source. She now presents with same and was noted to be anemic as well as multifactorial shock (hemorrhagic +cardiogenic +/- Septic) requiring vasopressor support and inotropes, along with C. Difficile infection.  History of Present Illness:  67 yo F presenting to [facility] ED from home for evaluation of GI bleeding.   History obtained through chart review and patient bedside report. Patient reports noting blood mixed with her stool in her ileostomy bag for an unknown amount of time. Then on 9/17 she noted bright red blood and a large clot when emptying the bag. This bleeding had not stopped causing her to come in for evaluation. She reports generalized fatigue and headache just today. She denies blurred vision, vomiting/ nausea/ acute abdominal pain, dysuria, chest pain, cough, dizziness, falls or LOC. She endorses chronic dyspnea and chronic RUQ tenderness due to a hernia and post surgical changes after her ileostomy was placed. She also reports being changed to Nadolol from metoprolol  and that she had been checking her BP at home because of this change, noting it was low today. Because of this she took a midodrine  this morning.   Of note patient has had previous episodes of bleeding requiring hospitalization and resuscitation. She has undergone extensive GI work up including EGD and capsule studies without identification of a source. Previous CT GI bleed imaging from 07/19/24 identified  extensive venous collaterals raising suspicion that this might be the source in the setting of liver cirrhosis, esophageal varices and AVM's. Bleeding has previously stopped and started without specific intervention. She is not on chronic anticoagulation and Plavix was stopped after most recent hospitalization in August. She does endorse taking a baby aspirin  daily. Patient also has chronic anemia, receiving iron  infusions and has chronic pancytopenia. She was scheduled for her first outpatient consultation with heme/onc for her pancytopenia next week. She was also recently started on protonix  BID.   ED course: Upon arrival patient alert and oriented, pale with soft BP. Labs significant for acutely dropped Hgb, Hgb on 9/4 was 9.2 > now 8.0.  Medications given: Octreotide  bolus followed by infusion, protonix , 1 L IVF bolus, IV contrast Initial Vitals: 97.7, 17, 70, 90/40 & 100% on RA Significant labs: (Labs/ Imaging personally reviewed) Chemistry: Na+: 134, K+: 3.6, BUN/Cr.: 31/1.68, Serum CO2/ AG: 22/10 Hematology: WBC: 5.3, Hgb: 8.0, platelets: 95   CT angio abdomen/pelvis w/wo contrast 08/14/24:  No evidence of active GI bleed. 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 No evidence of bowel obstruction. Cirrhosis with small ascites. Cholelithiasis.  Aortic Atherosclerosis   PCCM consulted for admission due to circulatory shock secondary to acute blood loss anemia in the setting of pancytopenia and chronic GIB requiring vasopressor support.  Please see Significant Hospital Events section below for full detailed hospital course.   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 Difficile   Antimicrobials:   Anti-infectives (From admission, onward)    Start     Dose/Rate Route Frequency Ordered Stop   08/15/24 1400  vancomycin  (VANCOCIN ) capsule 125 mg  Status:  Discontinued        125 mg Oral 4 times daily 08/15/24 1122 08/15/24 1125   08/15/24 1400  metroNIDAZOLE  (FLAGYL ) IVPB 500 mg        500 mg 100 mL/hr over 60 Minutes Intravenous Every 8 hours 08/15/24 1125 08/29/24 1359   08/15/24 1215  vancomycin  (VANCOCIN ) capsule 500 mg        500 mg Oral Every 6 hours 08/15/24 1125 08/29/24 1159       Significant Hospital Events: Including procedures, antibiotic start and stop dates in addition to other pertinent events   08/14/24: Admit to ICU due to circulatory shock secondary to acute blood loss anemia in the setting of pancytopenia and chronic GIB requiring vasopressor support. 08/15/24: Pts hgb remains stable following transfusion of 2 units pRBC's, however she's still requiring levophed  gtt @9  mcg/min to maintain map >65.  Per GI recommendations if she has active bleeding will place order for tagged bleeding scan  08/16/24: No significant events overnight.  Hgb continues to slowly trend down but no reports of bleeding, Tagged RBC scan pending.  Started on Dobutamine  overnight due to Coox 55, improved to 63 this morning with normal lactic acid, Levophed  requirements improving (down to 9 mcg); repeat Echocardiogram is pending. Lasix  80 mg BID initiated by CHF team.  Interim History / Subjective:  As outlined above under Significant Hospital Events section  Objective   Blood pressure (!) 97/58, pulse 71, temperature 98.2 F (36.8 C), temperature source Oral, resp. rate 12, height 5' 1 (1.549 m), weight 62.4 kg, SpO2  98%. CVP:  [15 mmHg-39 mmHg] 15 mmHg      Intake/Output Summary (Last 24 hours) at 08/16/2024 0751 Last data filed at 08/16/2024 0600 Gross per 24 hour  Intake 1777.87 ml  Output 1725 ml  Net 52.87 ml   Filed Weights   08/14/24 2006 08/15/24 0500 08/16/24 0500  Weight: 58.1 kg 58.1 kg 62.4 kg    Examination: General: Acutely-ill appearing female, sitting in bed, on room air, in NAD HEENT: MM pink/moist, anicteric, atraumatic, neck supple Neuro: A&O x 4, follows commands, no focal deficits, speech clear, PERRLA  CV: NSR (intermittent A. Fib), s1s2, no m/r/g, 2+ radial/2+ distal pulses, no edema  Pulm: Clear throughout, even, non labored, normal effort  GI: +BS x4, soft, rounded, ostomy in place, mild chronic tenderness at RUQ hernia  Skin: Scattered ecchymosis and excoriation from scratching noted Extremities: Warm/dry, moves all extremities, trace edema   Resolved Hospital Problem list     Assessment & Plan:   #Multifactorial Shock: Hemorrhagic + Cardiogenic +/- Septic #Acute on Chronic HFrEF #Atrial Fibrillation s/p Watchman's Procedure  PMHx: CAD, moderate to severe Mitral Regurgitation, severe Tricuspid Regurgitation -Echocardiogram 03/07/24: LVEF 35-40%, indeterminate diastolic parameters, RV systolic function moderately reduced, moderately elevated pulmonary artery systolic pressure (55.9 mmHg), severe mitral regurgitation, severe tricuspid regurgitation -Continuous cardiac monitoring -Maintain MAP >65 -Vasopressors as needed to maintain MAP goal -Advanced Heart Failure is following, appreciate input  -  Continue Dobutamine  ~ follow Coox -Lactic acid has normalized -Repeat Echocardiogram pending 9/19 -Diuresis as BP and renal function permits ~ started on 80 mg IV Lasix  BID by HF team on 9/19 -No anticoagulation due to acute GI bleed   #Acute Blood Loss Anemia due to ... #Acute GI Bleed #Cirrhosis with history of Varices  #Thrombocytopenia  -CT angio Abdomen 9/17:  negative for active GI bleed -Monitor for S/Sx of bleeding -Trend CBC -SCD's for VTE Prophylaxis  -Transfuse for Hgb <7 -Transfuse Platelets for Platelets count: <10K; <50K with active bleeding; <100 K with Neurosurgical procedures/processes -Continue PPI BID -GI following, appreciate input -Plan for Tagged RBC scan 9/19 -Peripheral smear with normal platelets  #C. Difficile Infection -Monitor fever curve -Trend WBC's  -Follow cultures as above -Continue empiric Flagyl  and PO Vancomycin  pending cultures & sensitivities  #AKI on CKD Stage IIIb -Monitor I&O's / urinary output -Follow BMP -Ensure adequate renal perfusion -Avoid nephrotoxic agents as able -Replace electrolytes as indicated ~ Pharmacy following for assistance with electrolyte replacement  #OSA on CPAP PMHx: former smoker  -Supplemental O2 as needed to maintain O2 sats >92% -CPAP at bedtime  -Follow intermittent Chest X-ray & ABG as needed -Bronchodilators prn -Pulmonary toilet as able  #Type II Diabetes Mellitus  #Hypothyroidism -CBG's q4h; Target range of 140 to 180 -SSI -Follow ICU Hypo/Hyperglycemia protocol -Continue outpatient Synthroid      Best Practice (right click and Reselect all SmartList Selections daily)   Diet/type: NPO DVT prophylaxis: SCD GI prophylaxis: PPI Lines: Central line and yes and it is still needed Foley:  N/A Code Status:  full code Last date of multidisciplinary goals of care discussion [9/19]  9/19: Pt updated at bedside on plan of care.  Labs   CBC: Recent Labs  Lab 08/14/24 1356 08/14/24 2028 08/14/24 2200 08/15/24 0120 08/15/24 0802 08/15/24 1446 08/15/24 2206 08/16/24 0155 08/16/24 0447  WBC 5.3  --  7.4  --  9.0  --   --   --  3.9*  NEUTROABS 3.2  --   --   --  6.0  --   --   --  2.4  HGB 8.0*   < > 8.9*   < > 8.7* 9.0* 9.4* 8.4* 8.0*  HCT 24.9*   < > 27.5*   < > 26.3* 26.8* 26.9* 24.4* 23.7*  MCV 103.8*  --  96.5  --  94.3  --   --   --  91.9  PLT  95*  --  113*  --  144*  --   --   --  70*   < > = values in this interval not displayed.    Basic Metabolic Panel: Recent Labs  Lab 08/14/24 1356 08/15/24 0117 08/15/24 2313 08/16/24 0447  NA 134* 133*  --  128*  K 3.6 4.4  --  3.9  CL 102 102  --  100  CO2 22 20*  --  21*  GLUCOSE 110* 164*  --  205*  BUN 31* 34*  --  33*  CREATININE 1.68* 1.77*  --  1.19*  CALCIUM  8.6* 7.9*  --  7.4*  MG  --  1.2* 2.2 2.3  PHOS  --  2.7  --  3.1   GFR: Estimated Creatinine Clearance: 37.8 mL/min (A) (by C-G formula based on SCr of 1.19 mg/dL (H)). Recent Labs  Lab 08/14/24 1356 08/14/24 2200 08/15/24 0802 08/15/24 1023 08/15/24 1301 08/15/24 1446 08/16/24 0447  WBC 5.3 7.4 9.0  --   --   --  3.9*  LATICACIDVEN  --   --   --  2.5* 1.9 2.2* 0.7    Liver Function Tests: Recent Labs  Lab 08/14/24 1356  AST 34  ALT 16  ALKPHOS 98  BILITOT 1.0  PROT 5.9*  ALBUMIN  3.2*   No results for input(s): LIPASE, AMYLASE in the last 168 hours. No results for input(s): AMMONIA in the last 168 hours.  ABG    Component Value Date/Time   PHART 7.47 (H) 03/01/2024 1055   PCO2ART 40 03/01/2024 1055   PO2ART 112 (H) 03/01/2024 1055   HCO3 29.1 (H) 03/01/2024 1055   ACIDBASEDEF 2.7 (H) 02/29/2024 0952   O2SAT 63 08/16/2024 0500     Coagulation Profile: Recent Labs  Lab 08/14/24 1356  INR 1.3*    Cardiac Enzymes: No results for input(s): CKTOTAL, CKMB, CKMBINDEX, TROPONINI in the last 168 hours.  HbA1C: Hgb A1c MFr Bld  Date/Time Value Ref Range Status  02/28/2024 06:08 AM 5.9 (H) 4.8 - 5.6 % Final    Comment:    (NOTE) Pre diabetes:          5.7%-6.4%  Diabetes:              >6.4%  Glycemic control for   <7.0% adults with diabetes   06/07/2023 02:58 AM 6.3 (H) 4.8 - 5.6 % Final    Comment:    (NOTE)         Prediabetes: 5.7 - 6.4         Diabetes: >6.4         Glycemic control for adults with diabetes: <7.0     CBG: Recent Labs  Lab  08/15/24 0732 08/15/24 1147 08/15/24 1721 08/15/24 2213 08/16/24 0736  GLUCAP 170* 189* 142* 180* 137*    Review of Systems:   Positives in BOLD: Gen: Denies fever, chills, weight change, fatigue, night sweats HEENT: Denies blurred vision, double vision, hearing loss, tinnitus, sinus congestion, rhinorrhea, sore throat, neck stiffness, dysphagia PULM: Denies shortness of breath, cough, sputum production, hemoptysis, wheezing CV: Denies chest pain, edema, orthopnea, paroxysmal nocturnal dyspnea, palpitations GI: Denies abdominal pain, nausea, vomiting, diarrhea, hematochezia, melena, constipation, change in bowel habits GU: Denies dysuria, hematuria, polyuria, oliguria, urethral discharge Endocrine: Denies hot or cold intolerance, polyuria, polyphagia or appetite change Derm: Denies rash, dry skin, scaling or peeling skin change Heme: Denies easy bruising, bleeding, bleeding gums Neuro: Denies headache, numbness, weakness, slurred speech, loss of memory or consciousness   Past Medical History:  She,  has a past medical history of AC (acromioclavicular) joint bone spurs, right, Anemia, Arthritis, Atrial fibrillation (HCC), Cardiac murmur, CHF (congestive heart failure) (HCC), Chronic anticoagulation, Clostridium difficile colitis (02/28/2024), Cor triatriatum, Coronary artery disease, Cortical cataract, DOE (dyspnea on exertion), DOE (dyspnea on exertion), GERD (gastroesophageal reflux disease), HFrEF (heart failure with reduced ejection fraction) (HCC), History of shingles (2004), Hyperlipidemia, Hypertension, Hypothyroidism, IBS (irritable bowel syndrome), Ischemic cardiomyopathy, Lower GI bleed (06/07/2023), Lumbar scoliosis, Lumbar spinal stenosis, Melena (12/17/2018), Migraines, Osteoporosis, Pancolitis (HCC) (02/28/2024), Pulmonary HTN (HCC), S/P CABG x 3 (01/28/2004), Sleep apnea, T2DM (type 2 diabetes mellitus) (HCC), TIA (transient ischemic attack) (05/29/2016), and Vitamin B 12  deficiency.   Surgical History:   Past Surgical History:  Procedure Laterality Date   CARDIAC CATHETERIZATION     CATARACT EXTRACTION W/ INTRAOCULAR LENS IMPLANT Bilateral    Cataract Extraction with IOL   COLECTOMY  02/28/2024   Procedure: COLECTOMY, TOTAL;  Surgeon: Lane Shope, MD;  Location: [facility] ORS;  Service: General;;  COLONOSCOPY N/A 12/19/2018   Procedure: COLONOSCOPY;  Surgeon: Janalyn Shanzay Hepworth NOVAK, MD;  Location: [facility] ENDOSCOPY;  Service: Endoscopy;  Laterality: N/A;   COLONOSCOPY WITH PROPOFOL  N/A 06/17/2020   Procedure: COLONOSCOPY WITH PROPOFOL ;  Surgeon: Janalyn Henessy Rohrer NOVAK, MD;  Location: [facility] ENDOSCOPY;  Service: Endoscopy;  Laterality: N/A;   COLONOSCOPY WITH PROPOFOL  N/A 03/30/2023   Procedure: COLONOSCOPY WITH PROPOFOL ;  Surgeon: Onita Elspeth Sharper, DO;  Location: [facility] ENDOSCOPY;  Service: Endoscopy;  Laterality: N/A;   COR TRIATRIATUM REPAIR N/A 01/28/2004   CORONARY ARTERY BYPASS GRAFT N/A 01/28/2004   3v CABG   ESOPHAGOGASTRODUODENOSCOPY N/A 12/19/2018   Procedure: ESOPHAGOGASTRODUODENOSCOPY (EGD);  Surgeon: Janalyn Tkeyah Burkman NOVAK, MD;  Location: [facility] ENDOSCOPY;  Service: Endoscopy;  Laterality: N/A;   ESOPHAGOGASTRODUODENOSCOPY N/A 06/11/2024   Procedure: EGD (ESOPHAGOGASTRODUODENOSCOPY);  Surgeon: Jinny Carmine, MD;  Location: [facility] ENDOSCOPY;  Service: Endoscopy;  Laterality: N/A;   ESOPHAGOGASTRODUODENOSCOPY N/A 07/18/2024   Procedure: EGD (ESOPHAGOGASTRODUODENOSCOPY);  Surgeon: Onita Elspeth Sharper, DO;  Location: [facility] ENDOSCOPY;  Service: Gastroenterology;  Laterality: N/A;  with Enteroscopy        DM on Plavix   ESOPHAGOGASTRODUODENOSCOPY (EGD) WITH PROPOFOL  N/A 06/17/2020   Procedure: ESOPHAGOGASTRODUODENOSCOPY (EGD) WITH PROPOFOL ;  Surgeon: Janalyn Margarit Minshall NOVAK, MD;  Location: [facility] ENDOSCOPY;  Service: Endoscopy;  Laterality: N/A;   ESOPHAGOGASTRODUODENOSCOPY (EGD) WITH PROPOFOL  N/A 03/30/2023   Procedure: ESOPHAGOGASTRODUODENOSCOPY (EGD) WITH  PROPOFOL ;  Surgeon: Onita Elspeth Sharper, DO;  Location: [facility] ENDOSCOPY;  Service: Endoscopy;  Laterality: N/A;   GIVENS CAPSULE STUDY N/A 07/21/2024   Procedure: IMAGING PROCEDURE, GI TRACT, INTRALUMINAL, VIA CAPSULE;  Surgeon: Therisa Bi, MD;  Location: [facility] ENDOSCOPY;  Service: Gastroenterology;  Laterality: N/A;   LAPAROTOMY N/A 02/28/2024   Procedure: LAPAROTOMY, EXPLORATORY;  Surgeon: Lane Shope, MD;  Location: [facility] ORS;  Service: General;  Laterality: N/A;   LEFT ATRIAL APPENDAGE OCCLUSION     RIGHT/LEFT HEART CATH AND CORONARY ANGIOGRAPHY Bilateral 03/29/2017   Procedure: Right/Left Heart Cath and Coronary Angiography;  Surgeon: Vinie DELENA Jude, MD;  Location: [facility] INVASIVE CV LAB;  Service: Cardiovascular;  Laterality: Bilateral;   TOTAL KNEE ARTHROPLASTY Right 04/05/2022   Procedure: TOTAL KNEE ARTHROPLASTY;  Surgeon: Kathlynn Sharper, MD;  Location: [facility] ORS;  Service: Orthopedics;  Laterality: Right;   TUBAL LIGATION     VENTRAL HERNIA REPAIR N/A 04/21/2017   Procedure: HERNIA REPAIR VENTRAL ADULT;  Surgeon: Claudene Larinda Bolder, MD;  Location: [facility] ORS;  Service: General;  Laterality: N/A;     Social History:   reports that she quit smoking about 25 years ago. Her smoking use included cigarettes. She has never used smokeless tobacco. She reports that she does not drink alcohol and does not use drugs.   Family History:  Her family history includes Breast cancer in her cousin; Cancer in her sister and sister; Diabetes in her father; Valvular heart disease in her mother.   Allergies Allergies  Allergen Reactions   Vioxx [Rofecoxib] Swelling     Home Medications  Prior to Admission medications   Medication Sig Start Date End Date Taking? Authorizing Provider  albuterol  (PROVENTIL  HFA;VENTOLIN  HFA) 108 (90 Base) MCG/ACT inhaler Inhale 2 puffs into the lungs every 6 (six) hours as needed for wheezing or shortness of breath.   Yes [provider]  allopurinol   (ZYLOPRIM ) 300 MG tablet Take 300 mg by mouth daily.   Yes [provider]  aspirin  EC 81 MG tablet Take 81 mg by mouth daily. Swallow whole.   Yes [provider]  Blood Glucose  Monitoring Suppl (ACCU-CHEK GUIDE ME) w/Device KIT  08/01/24  Yes [provider]  cyanocobalamin  (VITAMIN B12) 1000 MCG tablet Take 1,000 mcg by mouth daily.   Yes [provider]  empagliflozin  (JARDIANCE ) 10 MG TABS tablet Take 1 tablet by mouth daily. 04/17/24  Yes [provider]  fluticasone  (FLONASE ) 50 MCG/ACT nasal spray Place 2 sprays into the nose daily. 05/20/24  Yes [provider]  iron  polysaccharides (NIFEREX) 150 MG capsule Take 300 mg by mouth.  Take 2 capsules (300 mg total) by mouth once daily for 90 days 06/20/24 09/18/24 Yes [provider]  latanoprost  (XALATAN ) 0.005 % ophthalmic solution Place 1 drop into both eyes at bedtime.   Yes [provider]  levothyroxine  (SYNTHROID ) 50 MCG tablet Take 50 mcg by mouth daily before breakfast.   Yes [provider]  magnesium  oxide (MAG-OX) 400 MG tablet Take 400 mg by mouth. Take 1 tablet (400 mg total) by mouth once daily 05/20/24 05/20/25 Yes [provider]  metFORMIN  (GLUCOPHAGE ) 1000 MG tablet Take 1,000 mg by mouth 2 (two) times daily with a meal.   Yes [provider]  nadolol (CORGARD) 20 MG tablet Take 20 mg by mouth daily. 08/12/24 08/12/25 Yes [provider]  pantoprazole  (PROTONIX ) 40 MG tablet Take 40 mg by mouth daily. 01/28/21  Yes [provider]  rosuvastatin  (CRESTOR ) 5 MG tablet Take 5 mg by mouth daily. 02/20/24 02/19/25 Yes [provider]  spironolactone  (ALDACTONE ) 25 MG tablet Take 0.5 tablets (12.5 mg total) by mouth daily. 03/31/24  Yes Fausto Burnard LABOR, DO  torsemide  (DEMADEX ) 20 MG tablet Take 1 tablet (20 mg total) by mouth daily as needed (for weight gain or swelling). 03/31/24  Yes Fausto Burnard LABOR, DO  carvedilol   (COREG ) 3.125 MG tablet Take 3.125 mg by mouth 2 (two) times daily with a meal.    [provider]  clopidogrel (PLAVIX) 75 MG tablet Take 75 mg by mouth. Take 1 tablet (75 mg total) by mouth once daily for 182 days Patient not taking: Reported on 08/14/2024 05/29/24 11/27/24  [provider]  loperamide  (IMODIUM  A-D) 2 MG tablet Take 2 tablets (4 mg total) by mouth 4 (four) times daily as needed for diarrhea or loose stools. Patient not taking: Reported on 06/07/2024 04/04/24   Lane Shope, MD     Critical care time: 40 minutes     Inge Lecher, AGACNP-BC [facility] Prefer epic messenger for cross cover needs If after hours, please call E-link

## 2024-08-16 NOTE — TOC Initial Note (Signed)
 Transition of Care [facility]) - Initial/Assessment Note    Patient Details  Name: Tammy Arias MRN: [id number] Date of Birth: 1956-01-04  Transition of Care [facility]) CM/SW Contact:    Corrie JINNY Ruts, LCSW Phone Number: 08/16/2024, 1:37 PM  Clinical Narrative:                 Chart reviewed. The patient was admitted for Hemorrhagic Shock. I spoke with the patient via phone call. I introduced myself, my role, and reason for consult. The patient reports that she has been doing well. Per chart the patient PCP is Lynwood Null. The patient reports that she lives alone and she is independent. The patient reports that she drives her self to her medical appointments. The patient reports that she her son lives in another state and that her church family and friends will help her during discharge.  The patient reports that she uses [facility]. The patient report that she has had HH in April or may but does not remember the agency. The patient report that she has never been in a SNF and will not go into one. The patient reports that she has a cane and walker in the home but doesn't use them.   The patient did not report any concerns or questions during the consult. TOC will follow the patient until discharge. There are no TOC needs at this time.     Barriers to Discharge: Continued Medical Work up   Patient Goals and CMS Choice            Expected Discharge Plan and Services       Living arrangements for the past 2 months: Single Family Home                                      Prior Living Arrangements/Services Living arrangements for the past 2 months: Single Family Home Lives with:: Self Patient language and need for interpreter reviewed:: Yes              Criminal Activity/Legal Involvement Pertinent to Current Situation/Hospitalization: No - Comment as needed  Activities of Daily Living   ADL Screening (condition at time of admission) Independently performs ADLs?:  Yes (appropriate for developmental age) Is the patient deaf or have difficulty hearing?: No Does the patient have difficulty seeing, even when wearing glasses/contacts?: No Does the patient have difficulty concentrating, remembering, or making decisions?: No  Permission Sought/Granted                  Emotional Assessment   Attitude/Demeanor/Rapport: Gracious Affect (typically observed): Calm Orientation: : Oriented to Self, Oriented to Place, Oriented to  Time, Oriented to Situation Alcohol / Substance Use: Not Applicable Psych Involvement: No (comment)  Admission diagnosis:  Hemorrhagic shock (HCC) [R57.8] Gastrointestinal hemorrhage, unspecified gastrointestinal hemorrhage type [K92.2] Patient Active Problem List   Diagnosis Date Noted   HFrEF (heart failure with reduced ejection fraction) (HCC) 08/15/2024   Severe mitral regurgitation 08/15/2024   Hemorrhagic shock (HCC) 08/14/2024   Bilateral inguinal hernia without obstruction or gangrene 07/22/2024   Deficient knowledge of ileostomy 07/22/2024   GI bleed 07/19/2024   Esophageal varices without bleeding (HCC) 06/11/2024   Blood in stool 06/10/2024   GI bleeding 06/07/2024   Acute blood loss anemia 06/07/2024   Irritant contact dermatitis associated with fecal stoma 04/14/2024   Ileostomy care (HCC) 04/14/2024   Dehydration 04/14/2024  Hypokalemia 04/06/2024   Ileostomy in place [facility]) 04/04/2024   Status post total colectomy 04/04/2024   Chronic metabolic acidosis 04/04/2024   Enteritis due to Norovirus 04/04/2024   Elevated LFTs 04/04/2024   Glaucoma 04/04/2024   GERD without esophagitis 03/29/2024   Type 2 diabetes mellitus with chronic kidney disease, without long-term current use of insulin  (HCC) 03/29/2024   Dyslipidemia 03/29/2024   Chronic systolic CHF (congestive heart failure) (HCC) - LVEF 35 to 40% 03/17/2024   HTN (hypertension) 03/17/2024   Hypothyroidism 03/17/2024   Gout 03/17/2024    Hyponatremia 03/17/2024   Hypomagnesemia 03/17/2024   AKI (acute kidney injury) (HCC) 03/17/2024   Chronic diastolic CHF (congestive heart failure) (HCC) 06/07/2023   Chronic anticoagulation - on Eliquis  for chronic afib 06/07/2023   Cirrhosis and Portal hypertension  on CT(HCC) 06/07/2023   Thrombocytopenia (HCC) 06/06/2023   CAD with hx of CABG 06/06/2023   OSA on CPAP 05/23/2023   S/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, right 04/05/2022   S/P TKR (total knee replacement) 04/05/2022   Colon cancer screening    Iron  deficiency anemia    Gastric AVM    Barrett's esophagus without dysplasia    Angiodysplasia of intestinal tract    Internal hemorrhoids    Pulmonary hypertension (HCC) 03/29/2017   Coronary artery disease involving native coronary artery of native heart with unstable angina pectoris (HCC) 03/21/2017   Ischemic cardiomyopathy 03/21/2017   Atrial fibrillation, chronic (HCC) 10/08/2014   Hyperlipidemia 10/08/2014   Type 2 diabetes mellitus (HCC) 10/08/2014   PCP:  Valora Agent, MD Pharmacy:   [facility] - [place], [place] - [address] [place] [address] Phone: [phone] Fax: [phone]     Social Drivers of Health (SDOH) Social History: SDOH Screenings   Food Insecurity: No Food Insecurity (08/14/2024)  Housing: Low Risk  (08/14/2024)  Transportation Needs: No Transportation Needs (08/14/2024)  Utilities: Not At Risk (08/14/2024)  Depression (PHQ2-9): Low Risk  (08/08/2024)  Financial Resource Strain: Medium Risk (05/20/2024)   Received from [facility]  Social Connections: Moderately Integrated (08/14/2024)  Tobacco Use: Medium Risk (08/15/2024)   SDOH Interventions:     Readmission Risk Interventions    08/16/2024    1:36 PM 06/11/2024    4:23 PM 03/19/2024   10:52 AM  Readmission Risk Prevention Plan  Transportation Screening Complete Complete Complete  PCP or Specialist Appt within 3-5 Days   Complete   HRI or Home Care Consult   Complete  Social Work Consult for Recovery Care Planning/Counseling   Complete  Palliative Care Screening   Not Applicable  Medication Review [facility]) Complete Complete Complete  PCP or Specialist appointment within 3-5 days of discharge Complete    HRI or Home Care Consult  --   SW Recovery Care/Counseling Consult Complete    Palliative Care Screening Not Applicable Not Applicable   Skilled Nursing Facility Not Applicable Not Applicable

## 2024-08-16 NOTE — Progress Notes (Signed)
 Patient ID: Tammy Arias, female   DOB: 1956/09/08, 68 y.o.   MRN: [id number]     Advanced Heart Failure Rounding Note  Cardiologist: None  Chief Complaint: CHF Subjective:    Patient is now on dobutamine  2.5 + NE 9. SBP 90s-100s.  Co-ox 63% this morning.  CVP high at 18 with prominent CV waves.  Creatinine down to 1.19 and lactate normalized at 0.7.   Telemetry looks like atrial fibrillation with controlled rate, no ECG done yet.   No further overt bleeding noted from ostomy contents.  Hgb 8 today, plts lower at 70K.   She denies dyspnea.    I reviewed echo at bedside, EF 35-40% range, severe RV dilation with moderate RV dysfunction and mildly D-shaped IV septum, s/p mTEER with 1 Mitraclip and residual moderate MR (mean gradient 5), severe TR, IVC not visualized.    Objective:   Weight Range: 62.4 kg Body mass index is 25.99 kg/m.   Vital Signs:   Temp:  [97.5 F (36.4 C)-98.4 F (36.9 C)] 98.2 F (36.8 C) (09/19 0000) Pulse Rate:  [26-118] 71 (09/19 0630) Resp:  [11-31] 12 (09/19 0630) BP: (63-117)/(29-89) 97/58 (09/19 0630) SpO2:  [62 %-100 %] 98 % (09/19 0630) Weight:  [62.4 kg] 62.4 kg (09/19 0500) Last BM Date : 08/15/24  Weight change: Filed Weights   08/14/24 2006 08/15/24 0500 08/16/24 0500  Weight: 58.1 kg 58.1 kg 62.4 kg    Intake/Output:   Intake/Output Summary (Last 24 hours) at 08/16/2024 0944 Last data filed at 08/16/2024 0600 Gross per 24 hour  Intake 1246.35 ml  Output 1475 ml  Net -228.65 ml      Physical Exam    General:  Well appearing. No resp difficulty HEENT: Normal Neck: Supple. JVP 16 cm. Carotids 2+ bilat; no bruits. No lymphadenopathy or thyromegaly appreciated. Cor: PMI nondisplaced. Irregular rate & rhythm. No rubs, gallops or murmurs. Lungs: Clear Abdomen: Soft, nontender, mildly distended. No hepatosplenomegaly. No bruits or masses. Good bowel sounds. Has ostomy.  Extremities: No cyanosis, clubbing, rash, edema Neuro: Alert  & orientedx3, cranial nerves grossly intact. moves all 4 extremities w/o difficulty. Affect pleasant   Telemetry   Atrial fibrillation rate 80s (personally reviewed)  Labs    CBC Recent Labs    08/15/24 0802 08/15/24 1446 08/16/24 0155 08/16/24 0447  WBC 9.0  --   --  3.9*  NEUTROABS 6.0  --   --  2.4  HGB 8.7*   < > 8.4* 8.0*  HCT 26.3*   < > 24.4* 23.7*  MCV 94.3  --   --  91.9  PLT 144*  --   --  70*   < > = values in this interval not displayed.   Basic Metabolic Panel Recent Labs    90/81/74 0117 08/15/24 2313 08/16/24 0447  NA 133*  --  128*  K 4.4  --  3.9  CL 102  --  100  CO2 20*  --  21*  GLUCOSE 164*  --  205*  BUN 34*  --  33*  CREATININE 1.77*  --  1.19*  CALCIUM  7.9*  --  7.4*  MG 1.2* 2.2 2.3  PHOS 2.7  --  3.1   Liver Function Tests Recent Labs    08/14/24 1356  AST 34  ALT 16  ALKPHOS 98  BILITOT 1.0  PROT 5.9*  ALBUMIN  3.2*   No results for input(s): LIPASE, AMYLASE in the last 72 hours. Cardiac Enzymes No results  for input(s): CKTOTAL, CKMB, CKMBINDEX, TROPONINI in the last 72 hours.  BNP: BNP (last 3 results) Recent Labs    03/17/24 1723 06/07/24 1929 08/15/24 1022  BNP 166.8* 125.4* 391.6*    ProBNP (last 3 results) No results for input(s): PROBNP in the last 8760 hours.   D-Dimer No results for input(s): DDIMER in the last 72 hours. Hemoglobin A1C No results for input(s): HGBA1C in the last 72 hours. Fasting Lipid Panel No results for input(s): CHOL, HDL, LDLCALC, TRIG, CHOLHDL, LDLDIRECT in the last 72 hours. Thyroid  Function Tests No results for input(s): TSH, T4TOTAL, T3FREE, THYROIDAB in the last 72 hours.  Invalid input(s): FREET3  Other results:   Imaging    DG Chest Port 1 View Result Date: 08/15/2024 CLINICAL DATA:  Status post PICC placement. EXAM: PORTABLE CHEST 1 VIEW COMPARISON:  February 28, 2024.  August 14, 2024. FINDINGS: Stable cardiomegaly. Sternotomy  wires are noted. Endotracheal and nasogastric tube have been removed. Lungs are clear. There has been interval placement of left-sided PICC line. However, the distal tip appears to be to the left of the lower thoracic spine. The more proximal portion of the catheter appears to be lateral to the contours of the descending thoracic aorta, making intra-aortic location less likely. Upon review of yesterday's CTA, the patient may have a left-sided SVC which enters inferior portion of right atrium. Potentially the distal tip of the PICC line is within this. IMPRESSION: Interval placement of left-sided PICC line. However, the distal tip appears to be to the left of the lower thoracic spine. The more proximal portion of the catheter appears to be lateral to the contours of the descending thoracic aorta, making intra-aortic location less likely. Upon review of yesterday's CTA, the patient may have a left-sided SVC which enters inferior portion of right atrium. Potentially the distal tip of the PICC line is within this. CT scan is recommended for further evaluation. Electronically Signed   By: Lynwood Landy Raddle M.D.   On: 08/15/2024 17:04   US  EKG SITE RITE Result Date: 08/15/2024 If Site Rite image not attached, placement could not be confirmed due to current cardiac rhythm.  US  EKG SITE RITE Result Date: 08/15/2024 If Site Rite image not attached, placement could not be confirmed due to current cardiac rhythm.    Medications:     Scheduled Medications:  sodium chloride    Intravenous Once   Chlorhexidine  Gluconate Cloth  6 each Topical Daily   furosemide   80 mg Intravenous BID   insulin  aspart  0-5 Units Subcutaneous QHS   insulin  aspart  0-9 Units Subcutaneous TID WC   latanoprost   1 drop Both Eyes QHS   levothyroxine   50 mcg Oral Q0600   mupirocin  ointment  1 Application Nasal BID   pantoprazole  (PROTONIX ) IV  40 mg Intravenous Q12H   sodium chloride  flush  10-40 mL Intracatheter Q12H   vancomycin    500 mg Oral Q6H    Infusions:  DOBUTamine  2.5 mcg/kg/min (08/16/24 0600)   metronidazole  100 mL/hr at 08/16/24 0600   norepinephrine  (LEVOPHED ) Adult infusion 9 mcg/min (08/16/24 0852)   potassium chloride       PRN Medications: docusate sodium , hydrOXYzine , mouth rinse, polyethylene glycol, sodium chloride  flush   Assessment/Plan   1. Shock: Suspect mixed hemorrhagic, cardiogenic, septic/distributive shock. Lactate 2.5 initially, now down to 0.7. Baseline ischemic cardiomyopathy, I reviewed today's echo at bedside, EF 35-40% range, severe RV dilation with moderate RV dysfunction and mildly D-shaped IV septum, s/p mTEER with 1  Mitraclip and residual moderate MR (mean gradient 5), severe TR, IVC not visualized.  She also was admitted with GI bleeding requiring transfusion and was positive for C difficile again and may have a component of distributive shock.  She is currently on dobutamine  2.5 + NE 9. Co-ox adequate at 63% with CVP 18 with prominent CV waves (TR).  Hgb seems stable at 8.  - Continue dobutamine  2.5, wean NE for MAP 65.  2. Acute on chronic systolic CHF:  Ischemic cardiomyopathy but there is prominent RV failure as well with severe TR.  Echo in 6/25 showed  EF 30%, normal RV size/systolic function, s/p mTEER with 1 XTW Mitraclip with residual moderate-severe MR, severe TR, severe biatrial enlargement, PASP 58 mmHg.  I reviewed echo this admission at bedside, EF 35-40% range, severe RV dilation with moderate RV dysfunction and mildly D-shaped IV septum, s/p mTEER with 1 Mitraclip and residual moderate MR (mean gradient 5), severe TR, IVC not visualized. GDMT has been limited historically by low BP, she has had to take midodrine  at times.  Co-ox 63% on dobutamine  2.5 and NE 9.  CVP 18 with prominent CV waves in setting of severe TR. Creatinine back down to 1.19.  - Wean NE as tolerated as above.  - Continue dobutamine  2.5 for now.  - Lasix  80 mg IV bid with CVP 18. Follow response.  -  Hold Jardiance  and spironolactone  for now.   2. Atrial fibrillation: She appears to be in atrial fibrillation today.  No anticoagulation given Watchman. She is now off Plavix as well with GI bleeding.  - ECG to confirm rhythm.  4. AKI: Creatinine up to 1.77, baseline 1.1.  Suspect due to shock. Creatinine now back down to 1.19.  5. CAD: S/p CABG in 2005. No chest pain, troponin minimally elevated (suspect demand ischemia with hypotension).  6. Cirrhosis: NAFLD with portal hypertension (esophageal varices).  7. GI bleeding: In setting of AVMs and portal hypertension.  Recurrent GI bleed this admission.  She is not on any antiplatelet or anticoagulant meds at this time.  - Transfuse hgb < 8 (8 today).  - Octreotide  infusion per primary team.  - No plans as of yet for repeat endoscopy.  8. Valvular heart disease: S/p mTEER, had residual moderate-severe MR as well as severe TR on last echo.  - I reviewed echo this admission, she has severe TR and moderate residual MR s/p mTEER.  9. C difficile colitis: Fulminant C difficile infection in 4/25 with colectomy/end ileostomy. She is C difficile positive again this admission.  - Vancomycin /Flagyl  per primary.   CRITICAL CARE Performed by: Ezra Shuck  Total critical care time: 40 minutes  Critical care time was exclusive of separately billable procedures and treating other patients.  Critical care was necessary to treat or prevent imminent or life-threatening deterioration.  Critical care was time spent personally by me on the following activities: development of treatment plan with patient and/or surrogate as well as nursing, discussions with consultants, evaluation of patient's response to treatment, examination of patient, obtaining history from patient or surrogate, ordering and performing treatments and interventions, ordering and review of laboratory studies, ordering and review of radiographic studies, pulse oximetry and re-evaluation of  patient's condition.   Length of Stay: 2  Ezra Shuck, MD  08/16/2024, 9:44 AM  Advanced Heart Failure Team Pager [phone] (M-F; 7a - 5p)  Please contact [facility] for night-coverage after hours (5p -7a ) and weekends on [email or web address]

## 2024-08-16 NOTE — Progress Notes (Signed)
 Initial Nutrition Assessment  DOCUMENTATION CODES:   Non-severe (moderate) malnutrition in context of chronic illness  INTERVENTION:   Glucerna Shake po TID with diet advancement, each supplement provides 220 kcal and 10 grams of protein  MVI po daily with diet advancement   Vitamin C  500mg  po BID with diet advancement   Pt at high refeed risk; recommend monitor potassium, magnesium  and phosphorus labs daily until stable  Daily weights   Check vitamins K and C level  NUTRITION DIAGNOSIS:   Moderate Malnutrition related to chronic illness as evidenced by moderate fat depletion, moderate muscle depletion.  GOAL:   Patient will meet greater than or equal to 90% of their needs  MONITOR:   PO intake, Supplement acceptance, Labs, Weight trends, I & O's, Skin  REASON FOR ASSESSMENT:   Rounds    ASSESSMENT:   68 y/o female with h/o ischemic cardiomyopathy, gastric AVM, GI angiodysplasia, IBS-D, IDA, s/p watchman device, cholelithiasis, TIA, hypothyroidism, pulmonary hypertension, GERD, barrett's esophagus, HLD, HTN, OSA, CAD s/p CABG x 3, PAF, CHF, cirrhosis, portal hypertension, gout, CKD III, B12 deficiency, ventral hernia s/p mesh repair 2018, colonic angioectasias, diverticulosis, DDD, hiatal hernia, esophageal varices, C-diff and recent admission for pancolitis s/p exploratory laparotomy, 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  Met with pt in room today. Pt is well known to this RD from a recent previous admission. Pt reports good appetite and oral intake pta. Pt does report that she developed a hernia after surgery and has been having issues with reflux at home. Pt reports that she is hungry today but she is currently NPO for nuclear medicine bleeding scan today. Pt has been drinking the herbalife supplements at home (prefers vanilla). Pt takes vitamin B12 daily but not a multivitamin. RD  discussed with pt the importance of adequate nutrition needed to preserve lean muscle. Pt is agreeable to drink Glucerna with diet advancement. Recommended for patient to take a multivitamin daily at home. RD will add supplements and vitamins with diet advancement. Pt with recurrent GIB and ecchymosis on exam today; will check vitamin K and C levels as pt is at risk for deficiency secondary to her cirrhosis. Pt is likely at refeed risk.   Per chart, pt appears to be down ~25lbs(17%) over the past year. It is difficult to determine how much true weight loss pt has experienced as she has had frequent admissions for significant volume overload. Pt does have increased muscle and fat depletions on exam today and meets criteria for malnutrition.   Medications reviewed and include: lasix , insulin , synthroid , protonix , vancomycin , dobutamine , metronidazole , levophed , KCl  Labs reviewed: Na 128(L), K 3.9 wnl, BUN 33(H), creat 1.19(H), P 3.1 wnl, Mg 2.3 wnl Wbc- 3.9(L), Hgb 7.7(L), Hct 22.2(L) Cbgs- 154, 137 x 24 hrs   Vitamin D  36.20- 4/7 B12 1015(H)- 8/24  UOP-    NUTRITION - FOCUSED PHYSICAL EXAM:  Flowsheet Row Most Recent Value  Orbital Region Mild depletion  Upper Arm Region Moderate depletion  Thoracic and Lumbar Region Moderate depletion  Buccal Region No depletion  Temple Region Moderate depletion  Clavicle Bone Region Moderate depletion  Clavicle and Acromion Bone Region Moderate depletion  Scapular Bone Region Moderate depletion  Dorsal Hand Mild depletion  Patellar Region Moderate depletion  Anterior Thigh Region Moderate depletion  Posterior Calf Region Severe depletion  Edema (RD Assessment) Mild  Hair Reviewed  Eyes Reviewed  Mouth Reviewed  Skin Reviewed  Nails Reviewed   Diet Order:   Diet Order             Diet NPO time specified  Diet effective midnight                  EDUCATION NEEDS:   Education needs have been addressed  Skin:  Skin Assessment:  Reviewed RN Assessment (ecchymosis)  Last BM:  9/19-  Height:   Ht Readings from Last 1 Encounters:  08/14/24 5' 1 (1.549 m)    Weight:   Wt Readings from Last 1 Encounters:  08/16/24 62.4 kg    Ideal Body Weight:  47.7 kg  BMI:  Body mass index is 25.99 kg/m.  Estimated Nutritional Needs:   Kcal:  1600-1800kcal/day  Protein:  80-90g/day  Fluid:  1.3-1.5L/day  Augustin Shams MS, RD, LDN If unable to be reached, please send secure chat to RD inpatient available from 8:00a-4:00p daily

## 2024-08-16 NOTE — Progress Notes (Signed)
 PHARMACY CONSULT NOTE - ELECTROLYTES  Pharmacy Consult for Electrolyte Monitoring and Replacement   Recent Labs: Height: 5' 1 (154.9 cm) Weight: 62.4 kg (137 lb 9.1 oz) IBW/kg (Calculated) : 47.8 Estimated Creatinine Clearance: 37.8 mL/min (A) (by C-G formula based on SCr of 1.19 mg/dL (H)). Potassium (mmol/L)  Date Value  08/16/2024 3.9   Magnesium  (mg/dL)  Date Value  90/80/7974 2.3   Calcium  (mg/dL)  Date Value  90/80/7974 7.4 (L)   Albumin  (g/dL)  Date Value  90/82/7974 3.2 (L)  05/18/2020 4.7   Phosphorus (mg/dL)  Date Value  90/80/7974 3.1   Sodium (mmol/L)  Date Value  08/16/2024 128 (L)   Corrected Ca: 9.2 mg/dL  Assessment  Tammy Arias is a 68 y.o. female presenting with bleeding from her ileostomy. PMH significant for ischemic cardiomyopathy . Pharmacy has been consulted to monitor and replace electrolytes.  Diet: NPO MIVF:  Pertinent medications: Lasix  80 mg BID   Goal of Therapy: K>4, Mag>2 given cardiac history  9/19: K 3.9, Mg 2.3, Phos 3.1  Plan:  Kcl 10 mEq IV q1h x 2 ordered by HF RPh  Check BMP, Mg, Phos with AM labs  Thank you for allowing pharmacy to be a part of this patient's care.   Ransom Blanch PGY-1 Pharmacy Resident  [place] [facility]  08/16/2024 7:39 AM

## 2024-08-16 NOTE — Progress Notes (Signed)
 Tammy Copping, MD [facility]   [address], [address] [place], [place] [address] Phone: [phone] Fax : [phone]   Subjective: The patient has had no further sign of GI bleeding and her blood pressure is much more stable today.  Her hemoglobin this morning was 8.0 with a being 8.4 last night and 9.4 yesterday.  She denies any abdominal pain nausea vomiting fevers or chills.  The patient has been found to have C. difficile.   Objective: Vital signs in last 24 hours: Vitals:   08/16/24 0545 08/16/24 0600 08/16/24 0615 08/16/24 0630  BP: (!) 103/54 117/63 98/61 (!) 97/58  Pulse: 61 60 (!) 53 71  Resp: 14 15 18 12   Temp:      TempSrc:      SpO2: 99% 98% 99% 98%  Weight:      Height:       Weight change: 5.247 kg  Intake/Output Summary (Last 24 hours) at 08/16/2024 0826 Last data filed at 08/16/2024 0600 Gross per 24 hour  Intake 1669.01 ml  Output 1725 ml  Net -55.99 ml     Exam: General: The patient is in no apparent distress sitting up in bed alert and oriented x 3   Lab Results: @LABTEST2 @ Micro Results: Recent Results (from the past 240 hours)  MRSA Next Gen by PCR, Nasal     Status: Abnormal   Collection Time: 08/14/24  8:08 PM   Specimen: Nasal Mucosa; Nasal Swab  Result Value Ref Range Status   MRSA by PCR Next Gen DETECTED (A) NOT DETECTED Final    Comment: RESULT CALLED TO, READ BACK BY AND VERIFIED WITH:  HOLLY CHANDLER AT 2313 08/14/24 JG (NOTE) The GeneXpert MRSA Assay (FDA approved for NASAL specimens only), is one component of a comprehensive MRSA colonization surveillance program. It is not intended to diagnose MRSA infection nor to guide or monitor treatment for MRSA infections. Test performance is not FDA approved in patients less than 77 years old. Performed at [facility], [address] [place], [place] [address]   C Difficile Quick Screen w PCR reflex     Status: Abnormal   Collection Time: 08/15/24  9:29 AM   Specimen: STOOL   Result Value Ref Range Status   C Diff antigen POSITIVE (A) NEGATIVE Final   C Diff toxin POSITIVE (A) NEGATIVE Final   C Diff interpretation Toxin producing C. difficile detected.  Final    Comment: CRITICAL RESULT CALLED TO, READ BACK BY AND VERIFIED WITH: MEDFORD MATT RN 1027 08/15/24 HNM Performed at [facility], [address] [place], [place] [address]    Studies/Results: [facility] Chest Port 1 View Result Date: 08/15/2024 CLINICAL DATA:  Status post PICC placement. EXAM: PORTABLE CHEST 1 VIEW COMPARISON:  February 28, 2024.  August 14, 2024. FINDINGS: Stable cardiomegaly. Sternotomy wires are noted. Endotracheal and nasogastric tube have been removed. Lungs are clear. There has been interval placement of left-sided PICC line. However, the distal tip appears to be to the left of the lower thoracic spine. The more proximal portion of the catheter appears to be lateral to the contours of the descending thoracic aorta, making intra-aortic location less likely. Upon review of yesterday's CTA, the patient may have a left-sided SVC which enters inferior portion of right atrium. Potentially the distal tip of the PICC line is within this. IMPRESSION: Interval placement of left-sided PICC line. However, the distal tip appears to be to the left of the lower thoracic spine. The  more proximal portion of the catheter appears to be lateral to the contours of the descending thoracic aorta, making intra-aortic location less likely. Upon review of yesterday's CTA, the patient may have a left-sided SVC which enters inferior portion of right atrium. Potentially the distal tip of the PICC line is within this. CT scan is recommended for further evaluation. Electronically Signed   By: Lynwood Landy Raddle M.D.   On: 08/15/2024 17:04   US  EKG SITE RITE Result Date: 08/15/2024 If Site Rite image not attached, placement could not be confirmed due to current cardiac rhythm.  US  EKG SITE RITE Result Date:  08/15/2024 If Site Rite image not attached, placement could not be confirmed due to current cardiac rhythm.  CT Angio Abd/Pel W and/or Wo Contrast Result Date: 08/14/2024 CLINICAL DATA:  Lower GI bleed. EXAM: CTA ABDOMEN AND PELVIS WITHOUT AND WITH CONTRAST TECHNIQUE: Multidetector CT imaging of the abdomen and pelvis was performed using the standard protocol during bolus administration of intravenous contrast. Multiplanar reconstructed images and MIPs were obtained and reviewed to evaluate the vascular anatomy. RADIATION DOSE REDUCTION: This exam was performed according to the departmental dose-optimization program which includes automated exposure control, adjustment of the mA and/or kV according to patient size and/or use of iterative reconstruction technique. CONTRAST:  80mL OMNIPAQUE  IOHEXOL  350 MG/ML SOLN COMPARISON:  CT dated 07/19/2024. FINDINGS: VASCULAR Aorta: Moderate atherosclerotic calcification of the thoracic aorta. No adrenal dilatation or dissection. No periaortic fluid collection. Celiac: Atherosclerotic calcification of the origin of the celiac trunk. The celiac artery is patent. SMA: Atherosclerotic calcification of the origin of the SMA. The SMA is patent. Renals: Atherosclerotic calcification of the renal artery ostia. There is luminal narrowing with diminished flow in the renal arteries. IMA: The origin of the IMA is not visualized. There is reconstitution of flow within the IMA. Inflow: Mild atherosclerotic calcification of the iliac arteries. No aneurysmal dilatation or dissection. The iliac arteries are patent. Proximal Outflow: The visualized proximal of fluid patent. Veins: The IVC is unremarkable. The SMV, splenic vein, and main portal vein are patent. No portal venous gas. Review of the MIP images confirms the above findings. NON-VASCULAR Lower chest: Trace right pleural effusion. The visualized lung bases are clear. Mild cardiomegaly. No intra-abdominal free air.  Small ascites.  Hepatobiliary: Cirrhosis. Multiple gallstones. The gallbladder is contracted. Pancreas: Unremarkable. No pancreatic ductal dilatation or surrounding inflammatory changes. Spleen: Normal in size without focal abnormality. Adrenals/Urinary Tract: The adrenal glands unremarkable. There is no hydronephrosis or nephrolithiasis on either side. The visualized ureters and urinary bladder appear unremarkable. 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 Small parastomal hernia. No evidence of bowel obstruction. Residual oral contrast noted in the stomach. There is diffuse enhancement of the small bowel mucosa. No evidence of active GI bleed. Appendectomy. Lymphatic: No adenopathy. Reproductive: No suspicious pelvic mass. Other: Midline vertical anterior pelvic wall incisional scar and subcutaneous edema. No fluid collection. Musculoskeletal: Degenerative changes of the spine and scoliosis. No acute osseous pathology. IMPRESSION: 1. No evidence of active GI bleed. 2. 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 No evidence of bowel obstruction. 3. Cirrhosis with small ascites. 4. Cholelithiasis. 5.  Aortic Atherosclerosis (ICD10-I70.0). Electronically Signed   By: Vanetta Chou M.D.   On: 08/14/2024 16:25   Medications: I have reviewed the patient's current medications. Scheduled Meds:  sodium chloride    Intravenous Once   Chlorhexidine  Gluconate Cloth  6 each Topical Daily  insulin  aspart  0-5 Units Subcutaneous QHS   insulin  aspart  0-9 Units Subcutaneous TID WC   latanoprost   1 drop Both Eyes QHS   levothyroxine   50 mcg Oral Q0600   mupirocin  ointment  1 Application Nasal BID   pantoprazole  (PROTONIX ) IV  40 mg Intravenous Q12H   sodium chloride  flush  10-40 mL Intracatheter Q12H   vancomycin   500 mg Oral Q6H   Continuous Infusions:  DOBUTamine  2.5 mcg/kg/min (08/16/24 0600)   metronidazole  100 mL/hr at 08/16/24 0600    norepinephrine  (LEVOPHED ) Adult infusion 5 mcg/min (08/16/24 0600)   potassium chloride      PRN Meds:.docusate sodium , hydrOXYzine , mouth rinse, polyethylene glycol, sodium chloride  flush   Assessment: Principal Problem:   Hemorrhagic shock (HCC) Active Problems:   AKI (acute kidney injury) (HCC)   HFrEF (heart failure with reduced ejection fraction) (HCC)   Severe mitral regurgitation    Plan: The patient has had no further sign of any GI bleeding and has dropped her hemoglobin slightly since yesterday although it is not much lower than yesterday morning.  She denies any abdominal pain nausea vomiting fevers or chills.  I would suggest further supportive treatment and try to avoid anticoagulation if possible and pursue a source of bleeding if the bleeding should recur.   LOS: 2 days   Tammy Copping, MD.FACG 08/16/2024, 8:26 AM Pager [phone] 7am-5pm  Check AMION for 5pm -7am coverage and on weekends

## 2024-08-16 NOTE — Care Management Important Message (Signed)
 Important Message  Patient Details  Name: Tammy Arias MRN: [id number] Date of Birth: 1956-04-16   Important Message Given:  Yes - Medicare IM     Rojelio [name] 08/16/2024, 4:18 PM

## 2024-08-16 NOTE — Progress Notes (Signed)
 0800  Awake and alert. NPO since midnight. Talkative. Levophed  and Dobutamine  infusing per right upper arm double lumen PICC.  1030 K+ replacement started. Lasix  80 mg given per orders. 1200 Afebrile.Up and down to [facility] to void.. Vancomycin  po held per DOROTHA Lecher NP until after scan.1430 Down to [facility]. Via bed.1555 Nuclear Med started.

## 2024-08-16 NOTE — Telephone Encounter (Signed)
 Pharmacy Patient Advocate Encounter  Insurance verification completed.    The patient is insured through [facility].     Ran test claim for Vancomycin  125mg , fidaxomicin 200mg  and the current 10 day co-pay is $0.00.   This test claim was processed through  [facility]- copay amounts may vary at other pharmacies due to pharmacy/plan contracts, or as the patient moves through the different stages of their insurance plan.

## 2024-08-17 LAB — HEMOGLOBIN AND HEMATOCRIT, BLOOD
HCT: 25.5 % — ABNORMAL LOW (ref 36.0–46.0)
HCT: 28.6 % — ABNORMAL LOW (ref 36.0–46.0)
Hemoglobin: 8.7 g/dL — ABNORMAL LOW (ref 12.0–15.0)
Hemoglobin: 9.8 g/dL — ABNORMAL LOW (ref 12.0–15.0)

## 2024-08-17 LAB — RETICULOCYTES
Immature Retic Fract: 26.7 % — ABNORMAL HIGH (ref 2.3–15.9)
RBC.: 2.76 MIL/uL — ABNORMAL LOW (ref 3.87–5.11)
Retic Count, Absolute: 85.6 K/uL (ref 19.0–186.0)
Retic Ct Pct: 3.1 % (ref 0.4–3.1)

## 2024-08-17 LAB — COOXEMETRY PANEL
Carboxyhemoglobin: 2 % — ABNORMAL HIGH (ref 0.5–1.5)
Carboxyhemoglobin: 1.5 % (ref 0.5–1.5)
Methemoglobin: <0.7 % (ref 0.0–1.5)
Methemoglobin: 0.8 % (ref 0.0–1.5)
O2 Saturation: 73.9 %
O2 Saturation: 58.8 %
Total hemoglobin: 9.2 g/dL — ABNORMAL LOW (ref 12.0–16.0)
Total hemoglobin: 9.9 g/dL — ABNORMAL LOW (ref 12.0–16.0)
Total oxygen content: 72.2 %
Total oxygen content: 57.4 %

## 2024-08-17 LAB — IRON AND TIBC
Iron: 94 ug/dL (ref 28–170)
Saturation Ratios: 32 % — ABNORMAL HIGH (ref 10.4–31.8)
TIBC: 298 ug/dL (ref 250–450)
UIBC: 204 ug/dL

## 2024-08-17 LAB — MAGNESIUM: Magnesium: 1.9 mg/dL (ref 1.7–2.4)

## 2024-08-17 LAB — FOLATE: Folate: 19.7 ng/mL (ref >5.9)

## 2024-08-17 LAB — GLUCOSE, CAPILLARY
Glucose-Capillary: 147 mg/dL — ABNORMAL HIGH (ref 70–99)
Glucose-Capillary: 139 mg/dL — ABNORMAL HIGH (ref 70–99)
Glucose-Capillary: 302 mg/dL — ABNORMAL HIGH (ref 70–99)
Glucose-Capillary: 349 mg/dL — ABNORMAL HIGH (ref 70–99)

## 2024-08-17 LAB — VITAMIN B12: Vitamin B-12: 992 pg/mL — ABNORMAL HIGH (ref 180–914)

## 2024-08-17 LAB — CBC
HCT: 27.1 % — ABNORMAL LOW (ref 36.0–46.0)
Hemoglobin: 9.1 g/dL — ABNORMAL LOW (ref 12.0–15.0)
MCH: 31.1 pg (ref 26.0–34.0)
MCHC: 33.6 g/dL (ref 30.0–36.0)
MCV: 92.5 fL (ref 80.0–100.0)
Platelets: 60 K/uL — ABNORMAL LOW (ref 150–400)
RBC: 2.93 MIL/uL — ABNORMAL LOW (ref 3.87–5.11)
RDW: 18.1 % — ABNORMAL HIGH (ref 11.5–15.5)
WBC: 1.9 K/uL — ABNORMAL LOW (ref 4.0–10.5)
nRBC: 0 % (ref 0.0–0.2)

## 2024-08-17 LAB — RENAL FUNCTION PANEL
Albumin: 3.2 g/dL — ABNORMAL LOW (ref 3.5–5.0)
Anion gap: 10 (ref 5–15)
BUN: 26 mg/dL — ABNORMAL HIGH (ref 8–23)
CO2: 23 mmol/L (ref 22–32)
Calcium: 7.7 mg/dL — ABNORMAL LOW (ref 8.9–10.3)
Chloride: 101 mmol/L (ref 98–111)
Creatinine, Ser: 1.02 mg/dL — ABNORMAL HIGH (ref 0.44–1.00)
GFR, Estimated: 60 mL/min — ABNORMAL LOW (ref >60)
Glucose, Bld: 231 mg/dL — ABNORMAL HIGH (ref 70–99)
Phosphorus: 3.9 mg/dL (ref 2.5–4.6)
Potassium: 3.3 mmol/L — ABNORMAL LOW (ref 3.5–5.1)
Sodium: 134 mmol/L — ABNORMAL LOW (ref 135–145)

## 2024-08-17 LAB — FERRITIN: Ferritin: 717 ng/mL — ABNORMAL HIGH (ref 11–307)

## 2024-08-17 MED ORDER — MAGNESIUM SULFATE 2 GM/50ML IV SOLN
2.0000 g | Freq: Once | INTRAVENOUS | Status: AC
  Administered 2024-08-17: 2 g via INTRAVENOUS
  Filled 2024-08-17: qty 50

## 2024-08-17 MED ORDER — GLUCERNA SHAKE PO LIQD
237.0000 mL | Freq: Three times a day (TID) | ORAL | Status: DC
  Administered 2024-08-18 – 2024-08-22 (×9): 237 mL via ORAL

## 2024-08-17 MED ORDER — POTASSIUM CHLORIDE 10 MEQ/100ML IV SOLN
10.0000 meq | INTRAVENOUS | Status: AC
  Administered 2024-08-17 (×2): 10 meq via INTRAVENOUS
  Filled 2024-08-17 (×2): qty 100

## 2024-08-17 MED ORDER — VANCOMYCIN HCL 125 MG PO CAPS
125.0000 mg | ORAL_CAPSULE | Freq: Four times a day (QID) | ORAL | Status: DC
  Administered 2024-08-17 – 2024-08-19 (×9): 125 mg via ORAL
  Filled 2024-08-17 (×10): qty 1

## 2024-08-17 MED ORDER — POTASSIUM CHLORIDE 10 MEQ/100ML IV SOLN: 10.0000 meq | INTRAVENOUS | Status: DC

## 2024-08-17 MED ORDER — VITAMIN C 500 MG PO TABS
500.0000 mg | ORAL_TABLET | Freq: Two times a day (BID) | ORAL | Status: DC
  Administered 2024-08-17 – 2024-08-22 (×10): 500 mg via ORAL
  Filled 2024-08-17 (×10): qty 1

## 2024-08-17 MED ORDER — ADULT MULTIVITAMIN W/MINERALS CH
1.0000 | ORAL_TABLET | Freq: Every day | ORAL | Status: DC
  Administered 2024-08-18 – 2024-08-22 (×5): 1 via ORAL
  Filled 2024-08-17 (×5): qty 1

## 2024-08-17 NOTE — Progress Notes (Signed)
 NAME:  Tammy Arias, MRN:  [id number], DOB:  Apr 24, 1956, LOS: 3 ADMISSION DATE:  08/14/2024, CHIEF COMPLAINT:  GI Bleed   History of Present Illness:  68 yo F presenting to [facility] ED from home for evaluation of GI bleeding.   History obtained through chart review and patient bedside report. Patient reports noting blood mixed with her stool in her ileostomy bag for an unknown amount of time. Then on 9/17 she noted bright red blood and a large clot when emptying the bag. This bleeding had not stopped causing her to come in for evaluation. She reports generalized fatigue and headache just today. She denies blurred vision, vomiting/ nausea/ acute abdominal pain, dysuria, chest pain, cough, dizziness, falls or LOC. She endorses chronic dyspnea and chronic RUQ tenderness due to a hernia and post surgical changes after her ileostomy was placed. She also reports being changed to Nadolol from metoprolol  and that she had been checking her BP at home because of this change, noting it was low today. Because of this she took a midodrine  this morning.   Of note patient has had previous episodes of bleeding requiring hospitalization and resuscitation. She has undergone extensive GI work up including EGD and capsule studies without identification of a source. Previous CT GI bleed imaging from 07/19/24 identified extensive venous collaterals raising suspicion that this might be the source in the setting of liver cirrhosis, esophageal varices and AVM's. Bleeding has previously stopped and started without specific intervention. She is not on chronic anticoagulation and Plavix was stopped after most recent hospitalization in August. She does endorse taking a baby aspirin  daily. Patient also has chronic anemia, receiving iron  infusions and has chronic pancytopenia. She was scheduled for her first outpatient consultation with heme/onc for her pancytopenia next week. She was also recently started on protonix  BID.   ED  course: Upon arrival patient alert and oriented, pale with soft BP. Labs significant for acutely dropped Hgb, Hgb on 9/4 was 9.2 > now 8.0.  Medications given: Octreotide  bolus followed by infusion, protonix , 1 L IVF bolus, IV contrast Initial Vitals: 97.7, 17, 70, 90/40 & 100% on RA Significant labs: (Labs/ Imaging personally reviewed) Chemistry: Na+: 134, K+: 3.6, BUN/Cr.: 31/1.68, Serum CO2/ AG: 22/10 Hematology: WBC: 5.3, Hgb: 8.0, platelets: 95   CT angio abdomen/pelvis w/wo contrast 08/14/24:  No evidence of active GI bleed. 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 No evidence of bowel obstruction. Cirrhosis with small ascites. Cholelithiasis.  Aortic Atherosclerosis   PCCM consulted for admission due to circulatory shock secondary to acute blood loss anemia in the setting of pancytopenia and chronic GIB requiring vasopressor support.   Please see Significant Hospital Events section below for full detailed hospital course.  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 08/15/24: Pts hgb remains stable following transfusion  of 2 units pRBC's, however she's still requiring levophed  gtt @9   mcg/min to maintain map >65.  Per GI recommendations if she has active bleeding will place order for tagged bleeding scan  08/16/24: No significant events overnight.  Hgb continues to slowly trend down but no reports of bleeding, Tagged RBC scan pending.  Started on Dobutamine  overnight due to Coox 55, improved to 63 this morning with normal lactic acid, Levophed  requirements improving (down to 9 mcg); repeat Echocardiogram is pending. Lasix  80 mg BID initiated by CHF team. 08/17/24: remains on dobutamine , off nor-epinephrine . Another episode of GI bleed overnight. Tagged RBC scan negative  Interim History / Subjective:  Reports another episode of blood in the ostomy bag overnight and this morning. No abdominal pain.  Objective    Blood pressure 128/64, pulse 72, temperature 98 F (36.7 C), temperature source Oral, resp. rate (!) 23, height 5' 1 (1.549 m), weight 61 kg, SpO2 100%. CVP:  [14 mmHg-34 mmHg] 18 mmHg      Intake/Output Summary (Last 24 hours) at 08/17/2024 0854 Last data filed at 08/17/2024 0655 Gross per 24 hour  Intake 1186.3 ml  Output 4200 ml  Net -3013.7 ml   Filed Weights   08/15/24 0500 08/16/24 0500 08/17/24 0500  Weight: 58.1 kg 62.4 kg 61 kg     Micro Data:  9/17: MRSA PCR + 9/18: + C. Difficile   Examination: Physical Exam Constitutional:      Appearance: Normal appearance.  Cardiovascular:     Rate and Rhythm: Normal rate and regular rhythm.     Pulses: Normal pulses.     Heart sounds: Normal heart sounds.  Pulmonary:     Effort: Pulmonary effort is normal.     Breath sounds: Normal breath sounds.  Abdominal:     Palpations: Abdomen is soft.     Tenderness: There is no abdominal tenderness.     Comments: Ostomy in place, dark blood in bag.  Neurological:     General: No focal deficit present.     Mental Status: She is alert and oriented to person, place, and time. Mental status is at baseline.     Assessment and Plan   68 year old female with  history of HFrEF, Afib (s/p watchman, now off antiplatelets) and recurrent GI bleed who presents to the hospital with weakness and blood through the stoma. She's previously had an extensive investigation (including EGD, capsule study, CTA) without definitive identification of source. She now presents with same and was noted to be anemic as well as hypotensive requiring vasopressor support and ICU admission.   #Circulatory Shock (mixed distributive, hemorrhagic, and cardiogenic) #Acute Blood Loss Anemia #Acute Gastrointestinal Bleed #Acute Decompensated HFrEF #AKI on CKD #Atrial Fibrillation s/p Watchman's procedure #Hypothyroidism #Liver Cirrhosis   Neuro - patient's mentation is at baseline and she has no complaints.   CV - history of HFrEF with most recent TTE from June of 2025 showing ischemic cardiomyopathy with biventricular failure and elevated RV pressures. She also has severe mitral regurgitation that has not improved with mitral clip procedure. Repeat TTE shows similar findings.   Her initial hypotension was suspicious of hemorrhagic shock, though she remains hypotensive despite resuscitation and return of her hemoglobin to baseline. Investigated possible sources of septic shock with C. Diff testing returning positive (given history). Started on dobutamine  given elevated CVP and low central venous sat in coordination with advanced heart failure. She is also started in IV furosemide  for diuresis. Nor-epinephrine  has been weaned off,  and she remains on dobutamine . Central venous saturation this morning is improved significantly. Will consider discontinuing dobutamine  and trending central venous sat again this PM. Finally, did start stress dose steroids given abnormally low cortisol levels in the setting of significant physiologic stress.             -trend co-ox             -cortisol level quite low (expecting higher in setting of shock)             -heart failure and cardiology input  appreciated             -goal MAP > 65 mmHg  -continue furosemide  80 mg IV bid  -wean dobutamine  off   Pulm - no active issues. Closely monitoring for hypoxia as could indicate volume overload.   GI - history of liver cirrhosis with previously noted varices. No sign of variceal bleed at this point. GI bleed likely from ectatic vessels around the ostomy anastomosis site. GI has been consulted and recommend a tagged RBC scan should she continue to bleed (negative CTA and no role of endoscopic evaluation from their end). Discontinued Octreotide , continued on PPI (given history of peptic ulcer). Will treat C. Diff (see ID below). Tagged scan has been negative. Discussed with GI today, unfortunately we've exhausted our tools to investigate the source of the bleed. Will continue to conservatively manage and hold all anti-coagulation. Will consider repeat tagged scan depending on symptoms.  Renal - AKI on CKD in the setting of shock (septic vs hemorrhagic vs cardiogenic) and liver cirrhosis. Suspect this represents a pre-renal injury, and is more likely secondary to her hypotension (GI bleed) or advanced heart failure. Will hold off on albumin  (empiric for hepato-renal syndrome). Kidney function is further improved today.   Endo - ICU glycemic protocol started. Cortisol level checked and given state of shock and bleed is quite low and would expect higher. We started stress dose steroids with hydrocortisone  with improvement in blood pressure. Continued on home levothyroxine .   Hem/Onc - holding all anti-coagulation in the setting of active GI bleed. No sign of ACS. Given severe heart failure will liberalize transfusion strategy to a goal hemoglobin > 8.   ID - start treatment for C. Diff which is present on admission.  Labs   CBC: Recent Labs  Lab 08/14/24 1356 08/14/24 2028 08/14/24 2200 08/15/24 0120 08/15/24 0802 08/15/24 1446 08/16/24 0447 08/16/24 1055 08/16/24 1821 08/17/24 0303  08/17/24 0608  WBC 5.3  --  7.4  --  9.0  --  3.9*  --   --  1.9*  --   NEUTROABS 3.2  --   --   --  6.0  --  2.4  --   --   --   --   HGB 8.0*   < > 8.9*   < > 8.7*   < > 8.0* 7.7* 8.7* 9.1* 8.7*  HCT 24.9*   < > 27.5*   < > 26.3*   < > 23.7* 22.2* 25.1* 27.1* 25.5*  MCV 103.8*  --  96.5  --  94.3  --  91.9  --   --  92.5  --   PLT 95*  --  113*  --  144*  --  70*  --   --  60*  --    < > = values in this interval not displayed.    Basic Metabolic Panel: Recent Labs  Lab 08/14/24 1356 08/15/24 0117  08/15/24 2313 08/16/24 0447 08/17/24 0303  NA 134* 133*  --  128* 134*  K 3.6 4.4  --  3.9 3.3*  CL 102 102  --  100 101  CO2 22 20*  --  21* 23  GLUCOSE 110* 164*  --  205* 231*  BUN 31* 34*  --  33* 26*  CREATININE 1.68* 1.77*  --  1.19* 1.02*  CALCIUM  8.6* 7.9*  --  7.4* 7.7*  MG  --  1.2* 2.2 2.3 1.9  PHOS  --  2.7  --  3.1 3.9   GFR: Estimated Creatinine Clearance: 43.6 mL/min (A) (by C-G formula based on SCr of 1.02 mg/dL (H)). Recent Labs  Lab 08/14/24 2200 08/15/24 0802 08/15/24 1023 08/15/24 1301 08/15/24 1446 08/16/24 0447 08/17/24 0303  WBC 7.4 9.0  --   --   --  3.9* 1.9*  LATICACIDVEN  --   --  2.5* 1.9 2.2* 0.7  --     Liver Function Tests: Recent Labs  Lab 08/14/24 1356 08/17/24 0303  AST 34  --   ALT 16  --   ALKPHOS 98  --   BILITOT 1.0  --   PROT 5.9*  --   ALBUMIN  3.2* 3.2*   No results for input(s): LIPASE, AMYLASE in the last 168 hours. No results for input(s): AMMONIA in the last 168 hours.  ABG    Component Value Date/Time   PHART 7.47 (H) 03/01/2024 1055   PCO2ART 40 03/01/2024 1055   PO2ART 112 (H) 03/01/2024 1055   HCO3 29.1 (H) 03/01/2024 1055   ACIDBASEDEF 2.7 (H) 02/29/2024 0952   O2SAT 73.9 08/17/2024 0608     Coagulation Profile: Recent Labs  Lab 08/14/24 1356  INR 1.3*    Cardiac Enzymes: No results for input(s): CKTOTAL, CKMB, CKMBINDEX, TROPONINI in the last 168 hours.  HbA1C: Hgb A1c MFr Bld   Date/Time Value Ref Range Status  02/28/2024 06:08 AM 5.9 (H) 4.8 - 5.6 % Final    Comment:    (NOTE) Pre diabetes:          5.7%-6.4%  Diabetes:              >6.4%  Glycemic control for   <7.0% adults with diabetes   06/07/2023 02:58 AM 6.3 (H) 4.8 - 5.6 % Final    Comment:    (NOTE)         Prediabetes: 5.7 - 6.4         Diabetes: >6.4         Glycemic control for adults with diabetes: <7.0     CBG: Recent Labs  Lab 08/15/24 2213 08/16/24 0736 08/16/24 1146 08/16/24 2120 08/17/24 0746  GLUCAP 180* 137* 154* 284* 147*    Review of Systems:   N/A  Past Medical History:  She,  has a past medical history of AC (acromioclavicular) joint bone spurs, right, Anemia, Arthritis, Atrial fibrillation (HCC), Cardiac murmur, CHF (congestive heart failure) (HCC), Chronic anticoagulation, Clostridium difficile colitis (02/28/2024), Cor triatriatum, Coronary artery disease, Cortical cataract, DOE (dyspnea on exertion), DOE (dyspnea on exertion), GERD (gastroesophageal reflux disease), HFrEF (heart failure with reduced ejection fraction) (HCC), History of shingles (2004), Hyperlipidemia, Hypertension, Hypothyroidism, IBS (irritable bowel syndrome), Ischemic cardiomyopathy, Lower GI bleed (06/07/2023), Lumbar scoliosis, Lumbar spinal stenosis, Melena (12/17/2018), Migraines, Osteoporosis, Pancolitis (HCC) (02/28/2024), Pulmonary HTN (HCC), S/P CABG x 3 (01/28/2004), Sleep apnea, T2DM (type 2 diabetes mellitus) (HCC), TIA (transient ischemic attack) (05/29/2016), and Vitamin B 12 deficiency.  Surgical History:   Past Surgical History:  Procedure Laterality Date   CARDIAC CATHETERIZATION     CATARACT EXTRACTION W/ INTRAOCULAR LENS IMPLANT Bilateral    Cataract Extraction with IOL   COLECTOMY  02/28/2024   Procedure: COLECTOMY, TOTAL;  Surgeon: Lane Shope, MD;  Location: [facility] ORS;  Service: General;;   COLONOSCOPY N/A 12/19/2018   Procedure: COLONOSCOPY;  Surgeon: Janalyn Keene NOVAK, MD;  Location: [facility] ENDOSCOPY;  Service: Endoscopy;  Laterality: N/A;   COLONOSCOPY WITH PROPOFOL  N/A 06/17/2020   Procedure: COLONOSCOPY WITH PROPOFOL ;  Surgeon: Janalyn Keene NOVAK, MD;  Location: [facility] ENDOSCOPY;  Service: Endoscopy;  Laterality: N/A;   COLONOSCOPY WITH PROPOFOL  N/A 03/30/2023   Procedure: COLONOSCOPY WITH PROPOFOL ;  Surgeon: Onita Elspeth Sharper, DO;  Location: [facility] ENDOSCOPY;  Service: Endoscopy;  Laterality: N/A;   COR TRIATRIATUM REPAIR N/A 01/28/2004   CORONARY ARTERY BYPASS GRAFT N/A 01/28/2004   3v CABG   ESOPHAGOGASTRODUODENOSCOPY N/A 12/19/2018   Procedure: ESOPHAGOGASTRODUODENOSCOPY (EGD);  Surgeon: Janalyn Keene NOVAK, MD;  Location: [facility] ENDOSCOPY;  Service: Endoscopy;  Laterality: N/A;   ESOPHAGOGASTRODUODENOSCOPY N/A 06/11/2024   Procedure: EGD (ESOPHAGOGASTRODUODENOSCOPY);  Surgeon: Jinny Carmine, MD;  Location: [facility] ENDOSCOPY;  Service: Endoscopy;  Laterality: N/A;   ESOPHAGOGASTRODUODENOSCOPY N/A 07/18/2024   Procedure: EGD (ESOPHAGOGASTRODUODENOSCOPY);  Surgeon: Onita Elspeth Sharper, DO;  Location: [facility] ENDOSCOPY;  Service: Gastroenterology;  Laterality: N/A;  with Enteroscopy        DM on Plavix   ESOPHAGOGASTRODUODENOSCOPY (EGD) WITH PROPOFOL  N/A 06/17/2020   Procedure: ESOPHAGOGASTRODUODENOSCOPY (EGD) WITH PROPOFOL ;  Surgeon: Janalyn Keene NOVAK, MD;  Location: [facility] ENDOSCOPY;  Service: Endoscopy;  Laterality: N/A;   ESOPHAGOGASTRODUODENOSCOPY (EGD) WITH PROPOFOL  N/A 03/30/2023   Procedure: ESOPHAGOGASTRODUODENOSCOPY (EGD) WITH PROPOFOL ;  Surgeon: Onita Elspeth Sharper, DO;  Location: [facility] ENDOSCOPY;  Service: Endoscopy;  Laterality: N/A;   GIVENS CAPSULE STUDY N/A 07/21/2024   Procedure: IMAGING PROCEDURE, GI TRACT, INTRALUMINAL, VIA CAPSULE;  Surgeon: Therisa Bi, MD;  Location: [facility] ENDOSCOPY;  Service: Gastroenterology;  Laterality: N/A;   LAPAROTOMY N/A 02/28/2024   Procedure: LAPAROTOMY, EXPLORATORY;  Surgeon: Lane Shope, MD;   Location: [facility] ORS;  Service: General;  Laterality: N/A;   LEFT ATRIAL APPENDAGE OCCLUSION     RIGHT/LEFT HEART CATH AND CORONARY ANGIOGRAPHY Bilateral 03/29/2017   Procedure: Right/Left Heart Cath and Coronary Angiography;  Surgeon: Vinie DELENA Jude, MD;  Location: [facility] INVASIVE CV LAB;  Service: Cardiovascular;  Laterality: Bilateral;   TOTAL KNEE ARTHROPLASTY Right 04/05/2022   Procedure: TOTAL KNEE ARTHROPLASTY;  Surgeon: Kathlynn Sharper, MD;  Location: [facility] ORS;  Service: Orthopedics;  Laterality: Right;   TUBAL LIGATION     VENTRAL HERNIA REPAIR N/A 04/21/2017   Procedure: HERNIA REPAIR VENTRAL ADULT;  Surgeon: Claudene Larinda Bolder, MD;  Location: [facility] ORS;  Service: General;  Laterality: N/A;     Social History:   reports that she quit smoking about 25 years ago. Her smoking use included cigarettes. She has never used smokeless tobacco. She reports that she does not drink alcohol and does not use drugs.   Family History:  Her family history includes Breast cancer in her cousin; Cancer in her sister and sister; Diabetes in her father; Valvular heart disease in her mother.   Allergies Allergies  Allergen Reactions   Vioxx [Rofecoxib] Swelling     Home Medications  Prior to Admission medications   Medication Sig Start Date End Date Taking? Authorizing Provider  albuterol  (PROVENTIL  HFA;VENTOLIN  HFA) 108 (90 Base) MCG/ACT inhaler Inhale 2 puffs into the lungs every 6 (  six) hours as needed for wheezing or shortness of breath.   Yes [provider]  allopurinol  (ZYLOPRIM ) 300 MG tablet Take 300 mg by mouth daily.   Yes [provider]  aspirin  EC 81 MG tablet Take 81 mg by mouth daily. Swallow whole.   Yes [provider]  Blood Glucose Monitoring Suppl (ACCU-CHEK GUIDE ME) w/Device KIT  08/01/24  Yes [provider]  cyanocobalamin  (VITAMIN B12) 1000 MCG tablet Take 1,000 mcg by mouth daily.   Yes [provider]  empagliflozin  (JARDIANCE ) 10 MG  TABS tablet Take 1 tablet by mouth daily. 04/17/24  Yes [provider]  fluticasone  (FLONASE ) 50 MCG/ACT nasal spray Place 2 sprays into the nose daily. 05/20/24  Yes [provider]  iron  polysaccharides (NIFEREX) 150 MG capsule Take 300 mg by mouth.  Take 2 capsules (300 mg total) by mouth once daily for 90 days 06/20/24 09/18/24 Yes [provider]  latanoprost  (XALATAN ) 0.005 % ophthalmic solution Place 1 drop into both eyes at bedtime.   Yes [provider]  levothyroxine  (SYNTHROID ) 50 MCG tablet Take 50 mcg by mouth daily before breakfast.   Yes [provider]  magnesium  oxide (MAG-OX) 400 MG tablet Take 400 mg by mouth. Take 1 tablet (400 mg total) by mouth once daily 05/20/24 05/20/25 Yes [provider]  metFORMIN  (GLUCOPHAGE ) 1000 MG tablet Take 1,000 mg by mouth 2 (two) times daily with a meal.   Yes [provider]  nadolol (CORGARD) 20 MG tablet Take 20 mg by mouth daily. 08/12/24 08/12/25 Yes [provider]  pantoprazole  (PROTONIX ) 40 MG tablet Take 40 mg by mouth daily. 01/28/21  Yes [provider]  rosuvastatin  (CRESTOR ) 5 MG tablet Take 5 mg by mouth daily. 02/20/24 02/19/25 Yes [provider]  spironolactone  (ALDACTONE ) 25 MG tablet Take 0.5 tablets (12.5 mg total) by mouth daily. 03/31/24  Yes Fausto Sor A, DO  torsemide  (DEMADEX ) 20 MG tablet Take 1 tablet (20 mg total) by mouth daily as needed (for weight gain or swelling). 03/31/24  Yes Fausto Sor A, DO  carvedilol  (COREG ) 3.125 MG tablet Take 3.125 mg by mouth 2 (two) times daily with a meal.    [provider]  clopidogrel (PLAVIX) 75 MG tablet Take 75 mg by mouth. Take 1 tablet (75 mg total) by mouth once daily for 182 days Patient not taking: Reported on 08/14/2024 05/29/24 11/27/24  [provider]  loperamide  (IMODIUM  A-D) 2 MG tablet Take 2 tablets (4 mg total) by mouth 4 (four) times daily as needed for diarrhea or  loose stools. Patient not taking: Reported on 06/07/2024 04/04/24   Lane Shope, MD     The patient is critically ill due to circulatory shock, GI bleed, advanced heart failure.  Critical care was necessary to treat or prevent imminent or life-threatening deterioration. Critical care time was spent by me on the following activities: development of a treatment plan with the patient and/or surrogate as well as nursing, discussions with consultants, evaluation of the patient's response to treatment, examination of the patient, obtaining a history from the patient or surrogate, ordering and performing treatments and interventions, ordering and review of laboratory studies, ordering and review of radiographic studies, review of telemetry data including pulse oximetry, re-evaluation of patient's condition and participation in multidisciplinary rounds.   I personally spent 36 minutes providing critical care not including any separately billable procedures.   Belva November, MD [facility] 08/17/2024 9:00 AM

## 2024-08-17 NOTE — Plan of Care (Signed)
  Problem: Education: Goal: Knowledge of General Education information will improve Description: Including pain rating scale, medication(s)/side effects and non-pharmacologic comfort measures Outcome: Progressing   Problem: Clinical Measurements: Goal: Will remain free from infection Outcome: Progressing Goal: Diagnostic test results will improve Outcome: Progressing   Problem: Safety: Goal: Ability to remain free from injury will improve Outcome: Progressing

## 2024-08-17 NOTE — Progress Notes (Signed)
 Inpatient Follow-up/Progress Note   Patient ID: Tammy Arias is a 68 y.o. female.  Overnight Events / Subjective Findings Discussed case with PCCM Physician. Small amount of red blood in ostomy bag (~30cc) present this morning. Upon eval today, it is mixed with brownish liquid. She denies abdominal pain. No fever or chills. Extended discussion regarding limitations and difficulty finding her bleeding source as there have been several assessments by different modalities recently in attempt to capture source. Was initially cutting back her diet in case of possible procedure, but she is cleared to advance as tolerated. Rcd 4 u prbc in total during hospitalization   Review of Systems  Constitutional:  Negative for activity change, appetite change, chills, diaphoresis, fatigue, fever and unexpected weight change.  HENT:  Negative for trouble swallowing and voice change.   Respiratory:  Negative for shortness of breath and wheezing.   Cardiovascular:  Negative for chest pain, palpitations and leg swelling.  Gastrointestinal:  Positive for blood in stool (in ostomy bag). Negative for abdominal distention, abdominal pain, anal bleeding, constipation, diarrhea, nausea, rectal pain and vomiting.  Musculoskeletal:  Negative for arthralgias and myalgias.  Skin:  Negative for color change and pallor.  Neurological:  Negative for dizziness, syncope and weakness.  Psychiatric/Behavioral:  Negative for confusion.   All other systems reviewed and are negative.    Medications  Current Facility-Administered Medications:    0.9 %  sodium chloride  infusion (Manually program via Guardrails IV Fluids), , Intravenous, Once, Dgayli, Khabib, MD   Chlorhexidine  Gluconate Cloth 2 % PADS 6 each, 6 each, Topical, Daily, Rust-Chester, Britton L, NP, 6 each at 08/15/24 1129   DOBUTamine  (DOBUTREX ) infusion 4000 mcg/mL, 2.5 mcg/kg/min, Intravenous, Continuous, Rust-Chester, Britton L, NP, Last Rate: 2.18 mL/hr at  08/17/24 0531, 2.5 mcg/kg/min at 08/17/24 0531   docusate sodium  (COLACE) capsule 100 mg, 100 mg, Oral, BID PRN, Rust-Chester, Jenita L, NP   furosemide  (LASIX ) injection 80 mg, 80 mg, Intravenous, BID, McLean, Dalton S, MD, 80 mg at 08/16/24 1840   hydrocortisone  sodium succinate  (SOLU-CORTEF ) 100 MG injection 50 mg, 50 mg, Intravenous, Q8H, Dgayli, Khabib, MD, 50 mg at 08/17/24 0131   hydrOXYzine  (ATARAX ) tablet 25 mg, 25 mg, Oral, TID PRN, Rust-Chester, Britton L, NP, 25 mg at 08/15/24 2101   insulin  aspart (novoLOG ) injection 0-5 Units, 0-5 Units, Subcutaneous, QHS, Nelson, Dana G, NP, 3 Units at 08/16/24 2137   insulin  aspart (novoLOG ) injection 0-9 Units, 0-9 Units, Subcutaneous, TID WC, Nelson, Dana G, NP, 2 Units at 08/16/24 1235   latanoprost  (XALATAN ) 0.005 % ophthalmic solution 1 drop, 1 drop, Both Eyes, QHS, Rust-Chester, Britton L, NP, 1 drop at 08/16/24 2137   levothyroxine  (SYNTHROID ) tablet 50 mcg, 50 mcg, Oral, Q0600, Nelson, Dana G, NP, 50 mcg at 08/17/24 0557   metroNIDAZOLE  (FLAGYL ) IVPB 500 mg, 500 mg, Intravenous, Q8H, Nelson, Dana G, NP, Last Rate: 100 mL/hr at 08/17/24 0609, 500 mg at 08/17/24 0609   mupirocin  ointment (BACTROBAN ) 2 % 1 Application, 1 Application, Nasal, BID, Dgayli, Khabib, MD, 1 Application at 08/16/24 2137   norepinephrine  (LEVOPHED ) 4mg  in (0.016 mg/mL) premix infusion, 0-10 mcg/min, Intravenous, Titrated, Rust-Chester, Jenita CROME, NP, Stopped at 08/17/24 0406   Oral care mouth rinse, 15 mL, Mouth Rinse, PRN, Dgayli, Khabib, MD   pantoprazole  (PROTONIX ) injection 40 mg, 40 mg, Intravenous, Q12H, Rust-Chester, Britton L, NP, 40 mg at 08/16/24 2137   polyethylene glycol (MIRALAX  / GLYCOLAX ) packet 17 g, 17 g, Oral, Daily PRN, Rust-Chester, Jenita  L, NP   potassium chloride  10 mEq in 100 mL IVPB, 10 mEq, Intravenous, Q1 Hr x 2, Ouma, Almarie Bake, NP, Last Rate: 100 mL/hr at 08/17/24 0655, 10 mEq at 08/17/24 0655   sodium chloride  flush (NS) 0.9 %  injection 10-40 mL, 10-40 mL, Intracatheter, Q12H, Dgayli, Khabib, MD, 30 mL at 08/16/24 2141   sodium chloride  flush (NS) 0.9 % injection 10-40 mL, 10-40 mL, Intracatheter, PRN, Dgayli, Khabib, MD   vancomycin  (VANCOCIN ) capsule 500 mg, 500 mg, Oral, Q6H, Nelson, Dana G, NP, 500 mg at 08/17/24 0557  DOBUTamine  2.5 mcg/kg/min (08/17/24 0531)   metronidazole  500 mg (08/17/24 9390)   norepinephrine  (LEVOPHED ) Adult infusion Stopped (08/17/24 0406)   potassium chloride  10 mEq (08/17/24 0655)    docusate sodium , hydrOXYzine , mouth rinse, polyethylene glycol, sodium chloride  flush   Objective    Vitals:   08/17/24 0600 08/17/24 0615 08/17/24 0630 08/17/24 0645  BP: 100/65 (!) 102/46 (!) 104/50 (!) 108/56  Pulse: 69 62 68 80  Resp: 14 19 (!) 21 (!) 21  Temp:      TempSrc:      SpO2: 98% 97% 98% 100%  Weight:      Height:         Physical Exam Vitals and nursing note reviewed.  Constitutional:      General: She is not in acute distress.    Appearance: She is ill-appearing (chronically). She is not toxic-appearing or diaphoretic.  HENT:     Head: Normocephalic and atraumatic.     Nose: Nose normal.     Mouth/Throat:     Mouth: Mucous membranes are moist.     Pharynx: Oropharynx is clear.  Eyes:     General: No scleral icterus.    Extraocular Movements: Extraocular movements intact.  Cardiovascular:     Rate and Rhythm: Normal rate and regular rhythm.  Pulmonary:     Effort: Pulmonary effort is normal. No respiratory distress.     Breath sounds: Normal breath sounds. No wheezing, rhonchi or rales.  Abdominal:     General: Bowel sounds are normal. There is no distension.     Palpations: Abdomen is soft.     Tenderness: There is no abdominal tenderness. There is no guarding or rebound.     Comments: Ileostomy present; stoma is pink and viable and slight herniation. Some dark stool mixed with old blood still present in ostomy bag.  Musculoskeletal:     Cervical back: Neck  supple.     Right lower leg: No edema.     Left lower leg: No edema.  Skin:    General: Skin is warm and dry.     Coloration: Skin is not jaundiced or pale.  Neurological:     General: No focal deficit present.     Mental Status: She is alert and oriented to person, place, and time. Mental status is at baseline.  Psychiatric:        Mood and Affect: Mood normal.        Behavior: Behavior normal.        Thought Content: Thought content normal.        Judgment: Judgment normal.      Laboratory Data Recent Labs  Lab 08/14/24 1356 08/14/24 2028 08/15/24 0802 08/15/24 1446 08/16/24 0447 08/16/24 1055 08/16/24 1821 08/17/24 0303 08/17/24 0608  WBC 5.3   < > 9.0  --  3.9*  --   --  1.9*  --   HGB 8.0*   < >  8.7*   < > 8.0*   < > 8.7* 9.1* 8.7*  HCT 24.9*   < > 26.3*   < > 23.7*   < > 25.1* 27.1* 25.5*  PLT 95*   < > 144*  --  70*  --   --  60*  --   NEUTOPHILPCT 59  --  67  --  62  --   --   --   --   LYMPHOPCT 10  --  9  --  11  --   --   --   --   MONOPCT 11  --  9  --  12  --   --   --   --   EOSPCT 18  --  13  --  13  --   --   --   --    < > = values in this interval not displayed.   Recent Labs  Lab 08/14/24 1356 08/15/24 0117 08/16/24 0447 08/17/24 0303  NA 134* 133* 128* 134*  K 3.6 4.4 3.9 3.3*  CL 102 102 100 101  CO2 22 20* 21* 23  BUN 31* 34* 33* 26*  CREATININE 1.68* 1.77* 1.19* 1.02*  CALCIUM  8.6* 7.9* 7.4* 7.7*  PROT 5.9*  --   --   --   BILITOT 1.0  --   --   --   ALKPHOS 98  --   --   --   ALT 16  --   --   --   AST 34  --   --   --   GLUCOSE 110* 164* 205* 231*   Recent Labs  Lab 08/14/24 1356  INR 1.3*      Imaging Studies: NM GI Blood Loss Result Date: 08/16/2024 CLINICAL DATA:  Bright red blood per rectum, anemia EXAM: NUCLEAR MEDICINE GASTROINTESTINAL BLEEDING SCAN TECHNIQUE: Sequential abdominal images were obtained following intravenous administration of Tc-35m labeled red blood cells. RADIOPHARMACEUTICALS:  26.49 mCi Tc-82m  pertechnetate in-vitro labeled red cells. COMPARISON:  08/14/2024 FINDINGS: Sequential anterior planar images of the abdomen and pelvis are obtained for 2 hours after radiotracer administration. There is normal physiologic radiotracer distribution within the heart, liver, spleen, kidneys, and vascular structures. There is no abnormal radiotracer activity to suggest active gastrointestinal bleeding. IMPRESSION: 1. No evidence of active gastrointestinal bleeding. Electronically Signed   By: Ozell Daring M.D.   On: 08/16/2024 18:30   ECHOCARDIOGRAM COMPLETE Result Date: 08/16/2024    ECHOCARDIOGRAM REPORT   Patient Name:   Tammy Arias [facility] Date of Exam: 08/16/2024 Medical Rec #:  [id number]       Height:       61.0 in Accession #:    [id number]      Weight:       137.6 lb Date of Birth:  06-Jun-1956       BSA:          1.611 m Patient Age:    68 years        BP:           97/58 mmHg Patient Gender: F               HR:           62 bpm. Exam Location:  [facility] Procedure: 2D Echo, Cardiac Doppler and Color Doppler (Both Spectral and Color            Flow Doppler were utilized during procedure). Indications:     Congestive Heart  Failure I50.9  History:         Patient has prior history of Echocardiogram examinations, most                  recent 03/07/2024. CHF, Watchman Device; Mitral Valve Disease                  and MitraClip.  Sonographer:     Ashley McNeely-Sloane Referring Phys:  [id number] DANA G NELSON Diagnosing Phys: Ezra McleanMD IMPRESSIONS  1. Left ventricular ejection fraction, by estimation, is 35 to 40%. The left ventricle has moderately decreased function. The left ventricle demonstrates global hypokinesis. Left ventricular diastolic parameters are indeterminate.  2. Mildly D-shaped interventricular septum suggestive of RV pressure/volume overload. Peak RV-RA gradient 36 mmHg. Right ventricular systolic function is moderately reduced. The right ventricular size is severely enlarged.  3. Left atrial size was  severely dilated.  4. Right atrial size was severely dilated.  5. 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 Mean gradient 5 mmHg. Moderate residual mitral regurgitation.  6. The tricuspid valve is abnormal. Tricuspid valve regurgitation is severe.  7. The aortic valve is tricuspid. There is mild calcification of the aortic valve. Aortic valve regurgitation is not visualized. No aortic stenosis is present.  8. The IVC was not visualized.  9. The patient was in atrial fibrillation. FINDINGS  Left Ventricle: Left ventricular ejection fraction, by estimation, is 35 to 40%. The left ventricle has moderately decreased function. The left ventricle demonstrates global hypokinesis. The left ventricular internal cavity size was normal in size. There is no left ventricular hypertrophy. Left ventricular diastolic parameters are indeterminate. Right Ventricle: Mildly D-shaped interventricular septum suggestive of RV pressure/volume overload. Peak RV-RA gradient 36 mmHg. The right ventricular size is severely enlarged. No increase in right ventricular wall thickness. Right ventricular systolic function is moderately reduced. Left Atrium: Left atrial size was severely dilated. Right Atrium: Right atrial size was severely dilated. Pericardium: There is no evidence of pericardial effusion. Mitral Valve: S/p mitral valve repair via mTEER with 1 Mitraclip, position appears A2/P2. Mean gradient 5 mmHg. Moderate residual mitral regurgitation. MV peak gradient, 16.0 mmHg. The mean mitral valve gradient is 5.0 mmHg. Tricuspid Valve: The tricuspid valve is abnormal. Tricuspid valve regurgitation is severe. Aortic Valve: The aortic valve is tricuspid. There is mild calcification of the aortic valve. Aortic valve regurgitation is not visualized. No aortic stenosis is present. Aortic valve mean gradient measures 4.0 mmHg. Aortic valve peak gradient measures 6.9 mmHg. Aortic valve area, by VTI measures 1.51  cm. Pulmonic Valve: The pulmonic valve was abnormal. Pulmonic valve regurgitation is mild to moderate. Aorta: The aortic root is normal in size and structure. Venous: The inferior vena cava was not well visualized. IAS/Shunts: No atrial level shunt detected by color flow Doppler.  LEFT VENTRICLE PLAX 2D LVIDd:         4.40 cm     Diastology LVIDs:         3.90 cm     LV e' medial:    5.18 cm/s LV PW:         0.90 cm     LV E/e' medial:  35.7 LV IVS:        0.90 cm     LV e' lateral:   10.90 cm/s LVOT diam:     1.90 cm     LV E/e' lateral: 17.0 LV SV:         43  LV SV Index:   27 LVOT Area:     2.84 cm  LV Volumes (MOD) LV vol d, MOD A2C: 74.4 ml LV vol d, MOD A4C: 65.6 ml LV vol s, MOD A2C: 30.6 ml LV vol s, MOD A4C: 32.6 ml LV SV MOD A2C:     43.8 ml LV SV MOD A4C:     65.6 ml LV SV MOD BP:      37.1 ml RIGHT VENTRICLE RV S prime:     5.00 cm/s TAPSE (M-mode): 1.0 cm LEFT ATRIUM             Index        RIGHT ATRIUM           Index LA diam:        4.10 cm 2.54 cm/m   RA Area:     26.50 cm LA Vol (A2C):   81.9 ml 50.84 ml/m  RA Volume:   98.40 ml  61.08 ml/m LA Vol (A4C):   73.3 ml 45.50 ml/m LA Biplane Vol: 83.0 ml 51.52 ml/m  AORTIC VALVE                    PULMONIC VALVE AV Area (Vmax):    1.61 cm     PV Vmax:          0.89 m/s AV Area (Vmean):   1.54 cm     PV Vmean:         63.300 cm/s AV Area (VTI):     1.51 cm     PV VTI:           0.218 m AV Vmax:           131.00 cm/s  PV Peak grad:     3.2 mmHg AV Vmean:          89.800 cm/s  PV Mean grad:     2.0 mmHg AV VTI:            0.283 m      PR End Diast Vel: 6.15 msec AV Peak Grad:      6.9 mmHg     RVOT Peak grad:   2 mmHg AV Mean Grad:      4.0 mmHg LVOT Vmax:         74.40 cm/s LVOT Vmean:        48.700 cm/s LVOT VTI:          0.151 m LVOT/AV VTI ratio: 0.53  AORTA Ao Root diam: 2.70 cm Ao Asc diam:  2.10 cm MITRAL VALVE                TRICUSPID VALVE MV Area (PHT): 1.87 cm     TR Peak grad:   35.5 mmHg MV Area VTI:   0.91 cm     TR Mean grad:    23.0 mmHg MV Peak grad:  16.0 mmHg    TR Vmax:        298.00 cm/s MV Mean grad:  5.0 mmHg     TR Vmean:       229.0 cm/s MV Vmax:       2.00 m/s MV Vmean:      93.2 cm/s    SHUNTS MV Decel Time: 406 msec     Systemic VTI:  0.15 m MR Peak grad: 102.4 mmHg    Systemic Diam: 1.90 cm MR Mean grad: 63.0 mmHg     Pulmonic VTI:  0.170 m MR Vmax:  506.00 cm/s MR Vmean:     369.0 cm/s MV E velocity: 185.00 cm/s MV A velocity: 44.70 cm/s MV E/A ratio:  4.14 Dalton McleanMD Electronically signed by Ezra Kanner Signature Date/Time: 08/16/2024/11:31:59 AM    Final    DG Chest Port 1 View Result Date: 08/15/2024 CLINICAL DATA:  Status post PICC placement. EXAM: PORTABLE CHEST 1 VIEW COMPARISON:  February 28, 2024.  August 14, 2024. FINDINGS: Stable cardiomegaly. Sternotomy wires are noted. Endotracheal and nasogastric tube have been removed. Lungs are clear. There has been interval placement of left-sided PICC line. However, the distal tip appears to be to the left of the lower thoracic spine. The more proximal portion of the catheter appears to be lateral to the contours of the descending thoracic aorta, making intra-aortic location less likely. Upon review of yesterday's CTA, the patient may have a left-sided SVC which enters inferior portion of right atrium. Potentially the distal tip of the PICC line is within this. IMPRESSION: Interval placement of left-sided PICC line. However, the distal tip appears to be to the left of the lower thoracic spine. The more proximal portion of the catheter appears to be lateral to the contours of the descending thoracic aorta, making intra-aortic location less likely. Upon review of yesterday's CTA, the patient may have a left-sided SVC which enters inferior portion of right atrium. Potentially the distal tip of the PICC line is within this. CT scan is recommended for further evaluation. Electronically Signed   By: Lynwood Landy Raddle M.D.   On: 08/15/2024 17:04   US  EKG SITE  RITE Result Date: 08/15/2024 If Site Rite image not attached, placement could not be confirmed due to current cardiac rhythm.  US  EKG SITE RITE Result Date: 08/15/2024 If Site Rite image not attached, placement could not be confirmed due to current cardiac rhythm.   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 Continue transfusion as needed Monitor H&H.  Transfusion and resuscitation as per primary team Avoid frequent lab draws to prevent lab induced anemia  Will consider referral to hepatology as outpatient, although there may not be much change given age and comorbidities  GI to sign off. Available as needed. Please do not hesitate to call regarding questions or concerns. Call back if needed  Management of other medical comorbidities as per primary team  I personally performed the service.  Thank you for allowing us  to participate in this patient's care. Please don't hesitate to call if any questions or concerns arise.   Elspeth Ozell Jungling, DO [facility]  Portions of the record may have been created with voice recognition software. Occasional wrong-word or 'sound-a-like'  substitutions may have occurred due to the inherent limitations of voice recognition software.  Read the chart carefully and recognize, using context, where substitutions may have occurred.

## 2024-08-17 NOTE — Progress Notes (Addendum)
 0800 Small amount (39ml)of maroon colored blood noted in colostomy by patient. B/P stable. Patient states she feels fine. 0900 No increase more blood since 0800. 1000 B/P stable. No complaints. 1030 Dobutamine  drip stopped per order. 1300 Up in Chair for 4 hours. 1700 Back to bed without issues.

## 2024-08-17 NOTE — Progress Notes (Signed)
 PHARMACY CONSULT NOTE - ELECTROLYTES  Pharmacy Consult for Electrolyte Monitoring and Replacement   Recent Labs: Height: 5' 1 (154.9 cm) Weight: 61 kg (134 lb 7.7 oz) IBW/kg (Calculated) : 47.8 Estimated Creatinine Clearance: 43.6 mL/min (A) (by C-G formula based on SCr of 1.02 mg/dL (H)). Potassium (mmol/L)  Date Value  08/17/2024 3.3 (L)   Magnesium  (mg/dL)  Date Value  90/79/7974 1.9   Calcium  (mg/dL)  Date Value  90/79/7974 7.7 (L)   Albumin  (g/dL)  Date Value  90/79/7974 3.2 (L)  05/18/2020 4.7   Phosphorus (mg/dL)  Date Value  90/79/7974 3.9   Sodium (mmol/L)  Date Value  08/17/2024 134 (L)   Corrected Ca: 9.2 mg/dL  Assessment  Tammy Arias is a 68 y.o. female presenting with bleeding from her ileostomy. PMH significant for ischemic cardiomyopathy . Pharmacy has been consulted to monitor and replace electrolytes.  Diet: NPO MIVF:  Pertinent medications: Lasix  80 mg BID IV  Goal of Therapy: K>4, Mag>2 given cardiac history   Plan:  Kcl 10 mEq x 2 in the AM and another x 2 in the PM.  Mg 2 g IV x 1  F/u with AM labs.   Thank you for allowing pharmacy to be a part of this patient's care.  Cathaleen Blanch, PharmD, BCPS [place] [facility]  08/17/2024 8:43 AM

## 2024-08-18 DIAGNOSIS — R578 Other shock: Secondary | ICD-10-CM | POA: Diagnosis not present

## 2024-08-18 LAB — CBC
HCT: 27.2 % — ABNORMAL LOW (ref 36.0–46.0)
Hemoglobin: 9.2 g/dL — ABNORMAL LOW (ref 12.0–15.0)
MCH: 31.5 pg (ref 26.0–34.0)
MCHC: 33.8 g/dL (ref 30.0–36.0)
MCV: 93.2 fL (ref 80.0–100.0)
Platelets: 66 K/uL — ABNORMAL LOW (ref 150–400)
RBC: 2.92 MIL/uL — ABNORMAL LOW (ref 3.87–5.11)
RDW: 18.2 % — ABNORMAL HIGH (ref 11.5–15.5)
WBC: 4.7 K/uL (ref 4.0–10.5)
nRBC: 0 % (ref 0.0–0.2)

## 2024-08-18 LAB — GLUCOSE, CAPILLARY
Glucose-Capillary: 202 mg/dL — ABNORMAL HIGH (ref 70–99)
Glucose-Capillary: 267 mg/dL — ABNORMAL HIGH (ref 70–99)
Glucose-Capillary: 262 mg/dL — ABNORMAL HIGH (ref 70–99)
Glucose-Capillary: 254 mg/dL — ABNORMAL HIGH (ref 70–99)

## 2024-08-18 LAB — RENAL FUNCTION PANEL
Albumin: 3.4 g/dL — ABNORMAL LOW (ref 3.5–5.0)
Anion gap: 9 (ref 5–15)
BUN: 31 mg/dL — ABNORMAL HIGH (ref 8–23)
CO2: 22 mmol/L (ref 22–32)
Calcium: 7.8 mg/dL — ABNORMAL LOW (ref 8.9–10.3)
Chloride: 100 mmol/L (ref 98–111)
Creatinine, Ser: 1.13 mg/dL — ABNORMAL HIGH (ref 0.44–1.00)
GFR, Estimated: 53 mL/min — ABNORMAL LOW (ref >60)
Glucose, Bld: 141 mg/dL — ABNORMAL HIGH (ref 70–99)
Phosphorus: 3.2 mg/dL (ref 2.5–4.6)
Potassium: 3.7 mmol/L (ref 3.5–5.1)
Sodium: 131 mmol/L — ABNORMAL LOW (ref 135–145)

## 2024-08-18 LAB — COOXEMETRY PANEL
Carboxyhemoglobin: 1 % (ref 0.5–1.5)
Methemoglobin: <0.7 % (ref 0.0–1.5)
O2 Saturation: 57.2 %
Total hemoglobin: 10.4 g/dL — ABNORMAL LOW (ref 12.0–16.0)
Total oxygen content: 56.6 %

## 2024-08-18 LAB — HEMOGLOBIN AND HEMATOCRIT, BLOOD
HCT: 27.9 % — ABNORMAL LOW (ref 36.0–46.0)
Hemoglobin: 9.4 g/dL — ABNORMAL LOW (ref 12.0–15.0)

## 2024-08-18 MED ORDER — PANTOPRAZOLE SODIUM 40 MG IV SOLR
40.0000 mg | Freq: Two times a day (BID) | INTRAVENOUS | Status: DC
  Administered 2024-08-18 – 2024-08-22 (×9): 40 mg via INTRAVENOUS
  Filled 2024-08-18 (×9): qty 10

## 2024-08-18 MED ORDER — POTASSIUM CHLORIDE 10 MEQ/100ML IV SOLN
10.0000 meq | INTRAVENOUS | Status: DC
  Filled 2024-08-18 (×2): qty 100

## 2024-08-18 MED ORDER — POTASSIUM CHLORIDE CRYS ER 20 MEQ PO TBCR
40.0000 meq | EXTENDED_RELEASE_TABLET | Freq: Once | ORAL | Status: AC
  Administered 2024-08-18: 40 meq via ORAL
  Filled 2024-08-18: qty 2

## 2024-08-18 NOTE — Progress Notes (Signed)
 The patient has been transferred to room 242 in 2A. Report was given to Michaelle DASEN, Charity fundraiser. The patient was placed on tele monitoring up on arrival.

## 2024-08-18 NOTE — Progress Notes (Signed)
 Progress Note   Patient: Tammy Arias FMW:[id number] DOB: [birth year] DOA: 08/14/2024     4 DOS: the patient was seen and examined on 08/18/2024   Brief hospital course:  68 yo F presenting to [facility] ED from home for evaluation of GI bleeding, was admitted to ICU on 08/14/24.  See H&P and PCCM notes for full HPI on admission and hospital course to date. Significant events below.  08/14/24: Admit to ICU due to circulatory shock secondary to acute blood loss anemia in the setting of pancytopenia and chronic GIB requiring vasopressor support. 08/15/24: Pts hgb remains stable following transfusion of 2 units pRBC's, however she's still requiring levophed  gtt @9  mcg/min to maintain map >65.  Per GI recommendations if she has active bleeding will place order for tagged bleeding scan  08/16/24: No significant events overnight.  Hgb continues to slowly trend down but no reports of bleeding, Tagged RBC scan pending.  Started on Dobutamine  overnight due to Coox 55, improved to 63 this morning with normal lactic acid, Levophed  requirements improving (down to 9 mcg); repeat Echocardiogram is pending. Lasix  80 mg BID initiated by CHF team. 08/17/24: remains on dobutamine , off nor-epinephrine . Another episode of GI bleed overnight. Tagged RBC scan negative  Hospitalist service assumed care 08/18/24.  Further hospital course and management as outlined below.   Assessment and Plan:  Circulatory shock (mixed distributive, hemorrhagic, cardiogenic) - resolved.  Pt weaned off pressors as of 9/20. --Maintain MAP > 65 --Monitor hemodynamics closely  Acute blood loss anemia Acute GI bleeding Hx of Gastric AVM Portal hypertensive gastropathy - complication of cirrhosis.   Bleeding source this admission has not been identified despite multiple via imaging and endoscopy.  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  --Enteric precautions --Continue PO Vancomycin  --Monitor stool output, volume status --Cautious volume resuscitation as needed --Will consult ID tomorrow for treatment recommendations   Paroxysmal A-Fib s/p watchman device (not on chronic DOAC due to GIB) Acute on Chronic HFrEF (04/2024: LVEF 30%) Ischemic cardiomyopathy Severe Mitral Regurgitation Severe Tricuspid Regurgitation CAD s/p CABG (2005) - stable, no chest pain HLD HTN --Heart Failure team was following in ICU --On Lasix  80 mg IV bid  --Monitor volume status & electrolytes --Holding: Jardiance , spironolactone  --Off Plavix with bleeding   Pulmonary Hypertension --Monitor & supplement O2 as needed  T2DM --Sliding scale Novolog   Pancytopenia (followed by Heme/onc) --Monitor CBC  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  She reports ongoing high stool output but it's not as liquid, starting to get more mushy.  She denies SOB, CP, N/V or other complaints.    Physical Exam: Vitals:   08/18/24 1000 08/18/24 1100 08/18/24 1200 08/18/24 1300  BP: (!) 151/77 (!) 140/79 133/75 129/74  Pulse: 86 82 75  80  Resp: 19 15 (!) 21 16  Temp:      TempSrc:      SpO2: 100% 98% 100% 100%  Weight:      Height:       General exam: awake, alert, no acute distress HEENT: moist mucus membranes,  hearing grossly normal  Respiratory system: CTAB diminished bases, no wheezes, rales or rhonchi, normal respiratory effort. Cardiovascular system: normal S1/S2, RRR, no pedal edema.   Gastrointestinal system: soft, colostomy with liquid stool Central nervous system: A&O x 3. no gross focal neurologic deficits, normal speech Extremities: moves all, no edema, normal tone Skin: dry, intact, normal temperature Psychiatry: normal mood, congruent affect, judgement and insight appear normal    Data Reviewed:  Notable labs -- Na 131, glucose 141, BUN 31, Cr 1.13, Ca 7.8, albumin  3.4, Hbg trend 9.8 >> 9.2 >> 9.4 today  Family Communication:  none present. Pt updated in detail  Disposition: Status is: Inpatient Remains inpatient appropriate because: closely monitoring for further bleeding, remains on IV lasix    Planned Discharge Destination: Home    Time spent: 45 minutes  Author: Burnard DELENA Cunning, DO 08/18/2024 2:43 PM  For on call review [email or web address].

## 2024-08-18 NOTE — Plan of Care (Signed)

## 2024-08-18 NOTE — Progress Notes (Addendum)
 PHARMACY CONSULT NOTE - ELECTROLYTES  Pharmacy Consult for Electrolyte Monitoring and Replacement   Recent Labs: Height: 5' 1 (154.9 cm) Weight: 60.4 kg (133 lb 2.5 oz) IBW/kg (Calculated) : 47.8 Estimated Creatinine Clearance: 39.2 mL/min (A) (by C-G formula based on SCr of 1.13 mg/dL (H)). Potassium (mmol/L)  Date Value  08/18/2024 3.7   Magnesium  (mg/dL)  Date Value  90/79/7974 1.9   Calcium  (mg/dL)  Date Value  90/78/7974 7.8 (L)   Albumin  (g/dL)  Date Value  90/78/7974 3.4 (L)  05/18/2020 4.7   Phosphorus (mg/dL)  Date Value  90/78/7974 3.2   Sodium (mmol/L)  Date Value  08/18/2024 131 (L)   Corrected Ca: 9.2 mg/dL  Assessment  Tammy Arias is a 68 y.o. female presenting with bleeding from her ileostomy. PMH significant for ischemic cardiomyopathy . Pharmacy has been consulted to monitor and replace electrolytes.  Diet: Thin MIVF:  Pertinent medications: Lasix  80 mg BID IV  Goal of Therapy: K>4, Mag>2 given cardiac history   Plan:  Kcl 40 mEq x 1 F/u with AM labs.   Thank you for allowing pharmacy to be a part of this patient's care.  Cathaleen Blanch, PharmD, BCPS [place] [facility]  08/18/2024 9:20 AM

## 2024-08-19 ENCOUNTER — Inpatient Hospital Stay: Admitting: Oncology

## 2024-08-19 ENCOUNTER — Telehealth: Payer: Self-pay

## 2024-08-19 ENCOUNTER — Telehealth: Payer: Self-pay | Admitting: Oncology

## 2024-08-19 DIAGNOSIS — I5023 Acute on chronic systolic (congestive) heart failure: Secondary | ICD-10-CM | POA: Diagnosis not present

## 2024-08-19 DIAGNOSIS — Z87891 Personal history of nicotine dependence: Secondary | ICD-10-CM

## 2024-08-19 DIAGNOSIS — D696 Thrombocytopenia, unspecified: Secondary | ICD-10-CM

## 2024-08-19 DIAGNOSIS — A0472 Enterocolitis due to Clostridium difficile, not specified as recurrent: Secondary | ICD-10-CM

## 2024-08-19 DIAGNOSIS — R578 Other shock: Secondary | ICD-10-CM | POA: Diagnosis not present

## 2024-08-19 DIAGNOSIS — N179 Acute kidney failure, unspecified: Secondary | ICD-10-CM | POA: Diagnosis not present

## 2024-08-19 DIAGNOSIS — I8510 Secondary esophageal varices without bleeding: Secondary | ICD-10-CM

## 2024-08-19 DIAGNOSIS — I4891 Unspecified atrial fibrillation: Secondary | ICD-10-CM | POA: Diagnosis not present

## 2024-08-19 LAB — RENAL FUNCTION PANEL
Albumin: 3.2 g/dL — ABNORMAL LOW (ref 3.5–5.0)
Anion gap: 10 (ref 5–15)
BUN: 35 mg/dL — ABNORMAL HIGH (ref 8–23)
CO2: 24 mmol/L (ref 22–32)
Calcium: 8 mg/dL — ABNORMAL LOW (ref 8.9–10.3)
Chloride: 101 mmol/L (ref 98–111)
Creatinine, Ser: 1.1 mg/dL — ABNORMAL HIGH (ref 0.44–1.00)
GFR, Estimated: 54 mL/min — ABNORMAL LOW (ref >60)
Glucose, Bld: 182 mg/dL — ABNORMAL HIGH (ref 70–99)
Phosphorus: 2.1 mg/dL — ABNORMAL LOW (ref 2.5–4.6)
Potassium: 4 mmol/L (ref 3.5–5.1)
Sodium: 135 mmol/L (ref 135–145)

## 2024-08-19 LAB — CBC
HCT: 24.4 % — ABNORMAL LOW (ref 36.0–46.0)
Hemoglobin: 8.5 g/dL — ABNORMAL LOW (ref 12.0–15.0)
MCH: 32.3 pg (ref 26.0–34.0)
MCHC: 34.8 g/dL (ref 30.0–36.0)
MCV: 92.8 fL (ref 80.0–100.0)
Platelets: 78 K/uL — ABNORMAL LOW (ref 150–400)
RBC: 2.63 MIL/uL — ABNORMAL LOW (ref 3.87–5.11)
RDW: 18.6 % — ABNORMAL HIGH (ref 11.5–15.5)
WBC: 5.4 K/uL (ref 4.0–10.5)
nRBC: 0 % (ref 0.0–0.2)

## 2024-08-19 LAB — COOXEMETRY PANEL
Carboxyhemoglobin: 1.6 % — ABNORMAL HIGH (ref 0.5–1.5)
Methemoglobin: 0.9 % (ref 0.0–1.5)
O2 Saturation: 88.4 %
Total hemoglobin: 8.9 g/dL — ABNORMAL LOW (ref 12.0–16.0)
Total oxygen content: 86.2 %

## 2024-08-19 LAB — PREPARE RBC (CROSSMATCH)

## 2024-08-19 LAB — HEMOGLOBIN AND HEMATOCRIT, BLOOD
HCT: 23.1 % — ABNORMAL LOW (ref 36.0–46.0)
Hemoglobin: 7.9 g/dL — ABNORMAL LOW (ref 12.0–15.0)

## 2024-08-19 LAB — GLUCOSE, CAPILLARY
Glucose-Capillary: 201 mg/dL — ABNORMAL HIGH (ref 70–99)
Glucose-Capillary: 316 mg/dL — ABNORMAL HIGH (ref 70–99)
Glucose-Capillary: 426 mg/dL — ABNORMAL HIGH (ref 70–99)
Glucose-Capillary: 223 mg/dL — ABNORMAL HIGH (ref 70–99)

## 2024-08-19 MED ORDER — SODIUM CHLORIDE 0.9% IV SOLUTION: Freq: Once | INTRAVENOUS | Status: AC

## 2024-08-19 MED ORDER — TORSEMIDE 20 MG PO TABS
10.0000 mg | ORAL_TABLET | Freq: Every day | ORAL | Status: DC
  Administered 2024-08-21: 10 mg via ORAL
  Filled 2024-08-19: qty 1

## 2024-08-19 MED ORDER — TORSEMIDE 20 MG PO TABS
20.0000 mg | ORAL_TABLET | ORAL | Status: DC
Start: 2024-08-20 — End: 2024-08-22
  Administered 2024-08-20 – 2024-08-22 (×2): 20 mg via ORAL
  Filled 2024-08-19 (×3): qty 1

## 2024-08-19 MED ORDER — EMPAGLIFLOZIN 10 MG PO TABS
10.0000 mg | ORAL_TABLET | Freq: Every day | ORAL | Status: DC
  Administered 2024-08-19 – 2024-08-22 (×4): 10 mg via ORAL
  Filled 2024-08-19 (×4): qty 1

## 2024-08-19 MED ORDER — INSULIN ASPART 100 UNIT/ML IJ SOLN
15.0000 [IU] | Freq: Once | INTRAMUSCULAR | Status: AC
  Administered 2024-08-19: 15 [IU] via SUBCUTANEOUS
  Filled 2024-08-19: qty 1

## 2024-08-19 MED ORDER — K PHOS MONO-SOD PHOS DI & MONO 155-852-130 MG PO TABS
500.0000 mg | ORAL_TABLET | ORAL | Status: AC
  Administered 2024-08-19 (×2): 500 mg via ORAL
  Filled 2024-08-19 (×2): qty 2

## 2024-08-19 MED ORDER — FIDAXOMICIN 200 MG PO TABS
200.0000 mg | ORAL_TABLET | Freq: Two times a day (BID) | ORAL | Status: DC
Start: 2024-08-19 — End: 2024-08-29
  Administered 2024-08-19 – 2024-08-22 (×6): 200 mg via ORAL
  Filled 2024-08-19 (×6): qty 1

## 2024-08-19 MED ORDER — SPIRONOLACTONE 12.5 MG HALF TABLET
12.5000 mg | ORAL_TABLET | Freq: Every day | ORAL | Status: DC
  Administered 2024-08-19 – 2024-08-22 (×4): 12.5 mg via ORAL
  Filled 2024-08-19 (×4): qty 1

## 2024-08-19 NOTE — Progress Notes (Signed)
 Patient is independent in emptying her Ileostomy.   Per patient, her stool is back to normal consistency and amount however patient did state there was some blood in her stool yesterday 08/18/24 after eating her lunch and dinner.  RN requested patient notify RN if this occurs today and to not dispose of stool so that RN can assess, container provided to patient and patient voiced understanding to RN.

## 2024-08-19 NOTE — Consult Note (Signed)
 NAME: Tammy Arias  DOB: 1955/12/14  MRN: [id number]  Date/Time: 08/19/2024 4:30 PM  REQUESTING PROVIDER: Dr.Griffith Subjective:  REASON FOR CONSULT: Cdiff recurrent ? Tammy Arias is a 68 y.o. 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 She has had previous admissions for the same and has had Gi workup including EGD, and capsule studies . Gi bleed a combination of AVM, GAVE, esophageal varices   08/14/24 13:31  BP 85/42 (L)  Temp 97.7 F (36.5 C)  Pulse Rate 66  Resp 17  SpO2 99 %    Latest Reference Range & Units 08/14/24 13:56  WBC 4.0 - 10.5 K/uL 5.3  Hemoglobin 12.0 - 15.0 g/dL 8.0 (L)  HCT 63.9 - 53.9 % 24.9 (L)  Platelets 150 - 400 K/uL 95 (L) [1]  Creatinine 0.44 - 1.00 mg/dL 8.31 (H)   She was admitted to ICU for circulatory shock, had received octreotide , PRBC, received pressors. She was seen by GI- no intervention  Stool test was cdiff and was given Vanco PO high dose Pt was transferred out of ICU I am asked to see her She is stable Looks better No fever or chills No pain abdomen   Past Medical History:  Diagnosis Date   AC (acromioclavicular) joint bone spurs, right    Anemia    Arthritis    Atrial fibrillation (HCC)    a.) CHA2DS2-VASc = 7 (age, sex, HFrEF, HTN, TIA x 2, T2DM). b.) rate/rhythm controlled on oral carvedilol ; chronically anticoagulated with warfarin.   Cardiac murmur    CHF (congestive heart failure) (HCC)    Chronic anticoagulation    a.) warfarin   Clostridium difficile colitis 02/28/2024   Cor triatriatum    a.) s/p repair 01/2004   Coronary artery disease     a.) 3v CABG 01/28/2004. b.) R/LHC 03/29/2017: small RCA with occluded SVG; no significant Dz. Chronically occluded LAD with patent SVG to D1/LAD. Insignificant Dz in LCx. LM normal.   Cortical cataract    DOE (dyspnea on exertion)    DOE (dyspnea on exertion)    GERD (gastroesophageal reflux disease)    HFrEF (heart failure with reduced ejection fraction) (HCC)    a.) TTE 10/27/2014: EF 40%; glob HK; mild BAE; triv AR, mild MR/PR, mod TR.  b.) TTE 03/15/2017: EF 30%; glob HK; mod BAE; mod pHTN (RVSP 54.8 mmHg); mild PR, mod MR; sev TR.  c.) R/LHC 03/29/2017: EF 30-35%. d.)TTE 10/25/2018: EF 35%; mod BAE; mod BVE; glob HK; triv PR, mod MR, sev TR; RVSP 57.7 mmHg; G2DD.   History of shingles 2004   Hyperlipidemia    Hypertension    Hypothyroidism    IBS (irritable bowel syndrome)    Ischemic cardiomyopathy    a.) TTE 10/27/2014: EF 40%. b.) TTE 03/15/2017: EF 30%. c.) R/LHC 04/01/2017: EF 30-35%. d.) TTE 10/25/2018: EF 35%   Lower GI bleed 06/07/2023   Lumbar scoliosis    Lumbar spinal stenosis    Melena 12/17/2018   Migraines    Osteoporosis    Pancolitis 02/28/2024   Pulmonary HTN (HCC)    a.) TTE 03/15/2017: EF 30-35%; RVSP 54.8 mmHg.  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   Sleep apnea    T2DM (type 2 diabetes mellitus) (HCC)    TIA (transient ischemic attack) 05/29/2016   Vitamin B 12 deficiency     Past Surgical History:  Procedure Laterality Date   CARDIAC CATHETERIZATION     CATARACT EXTRACTION W/ INTRAOCULAR LENS IMPLANT Bilateral    Cataract Extraction with IOL   COLECTOMY  02/28/2024   Procedure: COLECTOMY, TOTAL;  Surgeon: Lane Shope, MD;  Location: [facility] ORS;  Service: General;;   COLONOSCOPY N/A 12/19/2018   Procedure: COLONOSCOPY;  Surgeon: Janalyn Keene NOVAK, MD;  Location: [facility] ENDOSCOPY;  Service: Endoscopy;  Laterality: N/A;   COLONOSCOPY WITH PROPOFOL   N/A 06/17/2020   Procedure: COLONOSCOPY WITH PROPOFOL ;  Surgeon: Janalyn Keene NOVAK, MD;  Location: [facility] ENDOSCOPY;  Service: Endoscopy;  Laterality: N/A;   COLONOSCOPY WITH PROPOFOL  N/A 03/30/2023   Procedure: COLONOSCOPY WITH PROPOFOL ;  Surgeon: Onita Elspeth Sharper, DO;  Location: [facility] ENDOSCOPY;  Service: Endoscopy;  Laterality: N/A;   COR TRIATRIATUM REPAIR N/A 01/28/2004   CORONARY ARTERY BYPASS GRAFT N/A 01/28/2004   3v CABG   ESOPHAGOGASTRODUODENOSCOPY N/A 12/19/2018   Procedure: ESOPHAGOGASTRODUODENOSCOPY (EGD);  Surgeon: Janalyn Keene NOVAK, MD;  Location: [facility] ENDOSCOPY;  Service: Endoscopy;  Laterality: N/A;   ESOPHAGOGASTRODUODENOSCOPY N/A 06/11/2024   Procedure: EGD (ESOPHAGOGASTRODUODENOSCOPY);  Surgeon: Jinny Carmine, MD;  Location: [facility] ENDOSCOPY;  Service: Endoscopy;  Laterality: N/A;   ESOPHAGOGASTRODUODENOSCOPY N/A 07/18/2024   Procedure: EGD (ESOPHAGOGASTRODUODENOSCOPY);  Surgeon: Onita Elspeth Sharper, DO;  Location: [facility] ENDOSCOPY;  Service: Gastroenterology;  Laterality: N/A;  with Enteroscopy        DM on Plavix   ESOPHAGOGASTRODUODENOSCOPY (EGD) WITH PROPOFOL  N/A 06/17/2020   Procedure: ESOPHAGOGASTRODUODENOSCOPY (EGD) WITH PROPOFOL ;  Surgeon: Janalyn Keene NOVAK, MD;  Location: [facility] ENDOSCOPY;  Service: Endoscopy;  Laterality: N/A;   ESOPHAGOGASTRODUODENOSCOPY (EGD) WITH PROPOFOL  N/A 03/30/2023   Procedure: ESOPHAGOGASTRODUODENOSCOPY (EGD) WITH PROPOFOL ;  Surgeon: Onita Elspeth Sharper, DO;  Location: [facility] ENDOSCOPY;  Service: Endoscopy;  Laterality: N/A;   GIVENS CAPSULE STUDY N/A 07/21/2024   Procedure: IMAGING PROCEDURE, GI TRACT, INTRALUMINAL, VIA CAPSULE;  Surgeon: Therisa Bi, MD;  Location: [facility] ENDOSCOPY;  Service: Gastroenterology;  Laterality: N/A;   LAPAROTOMY N/A 02/28/2024   Procedure: LAPAROTOMY, EXPLORATORY;  Surgeon: Lane Shope, MD;  Location: [facility] ORS;  Service: General;  Laterality: N/A;   LEFT ATRIAL APPENDAGE OCCLUSION     RIGHT/LEFT HEART  CATH AND CORONARY ANGIOGRAPHY Bilateral 03/29/2017   Procedure: Right/Left Heart Cath and Coronary Angiography;  Surgeon: Vinie DELENA Jude, MD;  Location: [facility] INVASIVE CV LAB;  Service: Cardiovascular;  Laterality: Bilateral;   TOTAL KNEE ARTHROPLASTY Right 04/05/2022   Procedure: TOTAL KNEE ARTHROPLASTY;  Surgeon: Kathlynn Sharper, MD;  Location: [facility] ORS;  Service: Orthopedics;  Laterality: Right;   TUBAL LIGATION     VENTRAL HERNIA REPAIR N/A 04/21/2017   Procedure: HERNIA REPAIR VENTRAL ADULT;  Surgeon: Claudene Larinda Bolder, MD;  Location: [facility] ORS;  Service: General;  Laterality: N/A;    Social History   Socioeconomic History   Marital status: Widowed    Spouse name: Not on file   Number of children: Not on file   Years of education: Not on file   Highest education level: Not on file  Occupational History   Occupation: certified nursing assistant  Tobacco Use   Smoking status: Former  Current packs/day: 0.00    Types: Cigarettes    Quit date: 03/30/1999    Years since quitting: 25.4   Smokeless tobacco: Never  Vaping Use   Vaping status: Never Used  Substance and Sexual Activity   Alcohol use: No   Drug use: No   Sexual activity: Not Currently  Other Topics Concern   Not on file  Social History Narrative   Not on file   Social Drivers of Health   Financial Resource Strain: Medium Risk (05/20/2024)   [facility] from [facility]   Overall Financial Resource Strain (CARDIA)    Difficulty of Paying Living Expenses: Somewhat hard  Food Insecurity: No Food Insecurity (08/14/2024)   Hunger Vital Sign    Worried About Running Out of Food in the Last Year: Never true    Ran Out of Food in the Last Year: Never true  Transportation Needs: No Transportation Needs (08/14/2024)   PRAPARE - Administrator, Civil Service (Medical): No    Lack of Transportation (Non-Medical): No  Physical Activity: Not on file  Stress: Not on file  Social Connections:  Moderately Integrated (08/14/2024)   Social Connection and Isolation Panel    Frequency of Communication with Friends and Family: Three times a week    Frequency of Social Gatherings with Friends and Family: Three times a week    Attends Religious Services: More than 4 times per year    Active Member of Clubs or Organizations: Yes    Attends Banker Meetings: More than 4 times per year    Marital Status: Widowed  Intimate Partner Violence: Not At Risk (08/14/2024)   Humiliation, Afraid, Rape, and Kick questionnaire    Fear of Current or Ex-Partner: No    Emotionally Abused: No    Physically Abused: No    Sexually Abused: No    Family History  Problem Relation Age of Onset   Valvular heart disease Mother    Diabetes Father    Cancer Sister        lung   Cancer Sister        cervical   Breast cancer Cousin    Allergies  Allergen Reactions   Vioxx [Rofecoxib] Swelling   I? Current Facility-Administered Medications  Medication Dose Route Frequency Provider Last Rate Last Admin   0.9 %  sodium chloride  infusion (Manually program via Guardrails IV Fluids)   Intravenous Once Dgayli, Khabib, MD       0.9 %  sodium chloride  infusion (Manually program via Guardrails IV Fluids)   Intravenous Once Fausto Sor A, DO       ascorbic acid  (VITAMIN C ) tablet 500 mg  500 mg Oral BID Isadora Hose, MD   500 mg at 08/19/24 0844   Chlorhexidine  Gluconate Cloth 2 % PADS 6 each  6 each Topical Daily Rust-Chester, Jenita CROME, NP   6 each at 08/19/24 1341   docusate sodium  (COLACE) capsule 100 mg  100 mg Oral BID PRN Rust-Chester, Britton L, NP       empagliflozin  (JARDIANCE ) tablet 10 mg  10 mg Oral Daily Bensimhon, Daniel R, MD   10 mg at 08/19/24 1158   feeding supplement (GLUCERNA SHAKE) (GLUCERNA SHAKE) liquid 237 mL  237 mL Oral TID BM Dgayli, Khabib, MD   237 mL at 08/19/24 0845   fidaxomicin  (DIFICID ) tablet 200 mg  200 mg Oral BID Fayette Bodily, MD       hydrOXYzine   (ATARAX ) tablet 25 mg  25 mg Oral TID PRN Rust-Chester, Britton L, NP   25 mg at 08/15/24 2101   insulin  aspart (novoLOG ) injection 0-5 Units  0-5 Units Subcutaneous QHS Nelson, Dana G, NP   3 Units at 08/18/24 2249   insulin  aspart (novoLOG ) injection 0-9 Units  0-9 Units Subcutaneous TID WC Nelson, Dana G, NP   7 Units at 08/19/24 1157   insulin  aspart (novoLOG ) injection 15 Units  15 Units Subcutaneous Once Fausto Sor A, DO       latanoprost  (XALATAN ) 0.005 % ophthalmic solution 1 drop  1 drop Both Eyes QHS Rust-Chester, Britton L, NP   1 drop at 08/18/24 2242   levothyroxine  (SYNTHROID ) tablet 50 mcg  50 mcg Oral Q0600 Nelson, Dana G, NP   50 mcg at 08/19/24 9450   multivitamin with minerals tablet 1 tablet  1 tablet Oral Daily Isadora Hose, MD   1 tablet at 08/19/24 9155   Oral care mouth rinse  15 mL Mouth Rinse PRN Isadora Hose, MD       pantoprazole  (PROTONIX ) injection 40 mg  40 mg Intravenous Q12H Fausto Sor A, DO   40 mg at 08/19/24 9160   polyethylene glycol (MIRALAX  / GLYCOLAX ) packet 17 g  17 g Oral Daily PRN Rust-Chester, Jenita CROME, NP       sodium chloride  flush (NS) 0.9 % injection 10-40 mL  10-40 mL Intracatheter Q12H Dgayli, Hose, MD   40 mL at 08/19/24 0846   sodium chloride  flush (NS) 0.9 % injection 10-40 mL  10-40 mL Intracatheter PRN Isadora Hose, MD       spironolactone  (ALDACTONE ) tablet 12.5 mg  12.5 mg Oral Daily Bensimhon, Toribio SAUNDERS, MD   12.5 mg at 08/19/24 1158   [START ON 08/21/2024] torsemide  (DEMADEX ) tablet 10 mg  10 mg Oral Daily Bensimhon, Toribio SAUNDERS, MD       NOREEN ON 08/20/2024] torsemide  (DEMADEX ) tablet 20 mg  20 mg Oral QODAY Bensimhon, Toribio SAUNDERS, MD         Abtx:  Anti-infectives (From admission, onward)    Start     Dose/Rate Route Frequency Ordered Stop   08/19/24 1800  fidaxomicin  (DIFICID ) tablet 200 mg        200 mg Oral 2 times daily 08/19/24 1544 08/29/24 2159   08/17/24 1200  vancomycin  (VANCOCIN ) capsule 125 mg  Status:   Discontinued        125 mg Oral Every 6 hours 08/17/24 1044 08/19/24 1544   08/15/24 1400  vancomycin  (VANCOCIN ) capsule 125 mg  Status:  Discontinued        125 mg Oral 4 times daily 08/15/24 1122 08/15/24 1125   08/15/24 1400  metroNIDAZOLE  (FLAGYL ) IVPB 500 mg  Status:  Discontinued        500 mg 100 mL/hr over 60 Minutes Intravenous Every 8 hours 08/15/24 1125 08/17/24 1011   08/15/24 1215  vancomycin  (VANCOCIN ) capsule 500 mg  Status:  Discontinued        500 mg Oral Every 6 hours 08/15/24 1125 08/17/24 1044       REVIEW OF SYSTEMS:  Const: negative fever, negative chills,  40 pound weight loss Eyes: negative diplopia or visual changes, negative eye pain ENT: negative coryza, negative sore throat Resp: negative cough, hemoptysis, dyspnea Cards: negative for chest pain, palpitations, lower extremity edema GU: negative for frequency, dysuria and hematuria GI:  bleeding,  Skin: negative for rash and pruritus Heme: negative for easy bruising and gum/nose bleeding MS: weakness Neurolo:negative  for headaches, dizziness, vertigo, memory problems  Psych: negative for feelings of anxiety, depression  Endocrine: hypothyroidism Allergy/Immunology- Vioxx Objective:  VITALS:  BP 121/64 (BP Location: Right Leg)   Pulse 82   Temp 98.4 F (36.9 C)   Resp 18   Ht 5' 1 (1.549 m)   Wt 58.2 kg   SpO2 100%   BMI 24.24 kg/m   PHYSICAL EXAM:  General: Alert, cooperative, no distress, appears stated age.  Head: Normocephalic, without obvious abnormality, atraumatic. Eyes: Conjunctivae clear, anicteric sclerae. Pupils are equal ENT Nares normal. No drainage or sinus tenderness. Lips, mucosa, and tongue normal. No Thrush Neck: Supple, symmetrical, no adenopathy, thyroid : non tender no carotid bruit and no JVD. Back: No CVA tenderness. Lungs: Clear to auscultation bilaterally. No Wheezing or Rhonchi. No rales. Heart: irregular Abdomen: Soft, ileostomy- some dark red stool Extremities:  atraumatic, no cyanosis. No edema. No clubbing Skin: No rashes or lesions. Or bruising Lymph: Cervical, supraclavicular normal. Neurologic: Grossly non-focal Pertinent Labs Lab Results CBC    Component Value Date/Time   WBC 5.4 08/19/2024 0603   RBC 2.63 (L) 08/19/2024 0603   HGB 7.9 (L) 08/19/2024 1540   HGB 7.6 (L) 06/28/2024 1120   HCT 23.1 (L) 08/19/2024 1540   PLT 78 (L) 08/19/2024 0603   PLT 106 (L) 06/28/2024 1120   MCV 92.8 08/19/2024 0603   MCH 32.3 08/19/2024 0603   MCHC 34.8 08/19/2024 0603   RDW 18.6 (H) 08/19/2024 0603   LYMPHSABS 0.4 (L) 08/16/2024 0447   MONOABS 0.5 08/16/2024 0447   EOSABS 0.5 08/16/2024 0447   BASOSABS 0.0 08/16/2024 0447       Latest Ref Rng & Units 08/19/2024    6:03 AM 08/18/2024    5:14 AM 08/17/2024    3:03 AM  CMP  Glucose 70 - 99 mg/dL 817  858  768   BUN 8 - 23 mg/dL 35  31  26   Creatinine 0.44 - 1.00 mg/dL 8.89  8.86  8.97   Sodium 135 - 145 mmol/L 135  131  134   Potassium 3.5 - 5.1 mmol/L 4.0  3.7  3.3   Chloride 98 - 111 mmol/L 101  100  101   CO2 22 - 32 mmol/L 24  22  23    Calcium  8.9 - 10.3 mg/dL 8.0  7.8  7.7       Microbiology: Recent Results (from the past 240 hours)  MRSA Next Gen by PCR, Nasal     Status: Abnormal   Collection Time: 08/14/24  8:08 PM   Specimen: Nasal Mucosa; Nasal Swab  Result Value Ref Range Status   MRSA by PCR Next Gen DETECTED (A) NOT DETECTED Final    Comment: RESULT CALLED TO, READ BACK BY AND VERIFIED WITH:  HOLLY CHANDLER AT 2313 08/14/24 JG (NOTE) The GeneXpert MRSA Assay (FDA approved for NASAL specimens only), is one component of a comprehensive MRSA colonization surveillance program. It is not intended to diagnose MRSA infection nor to guide or monitor treatment for MRSA infections. Test performance is not FDA approved in patients less than 69 years old. Performed at [facility], [address] [place], [place] [address]   C Difficile Quick Screen w PCR reflex      Status: Abnormal   Collection Time: 08/15/24  9:29 AM   Specimen: STOOL  Result Value Ref Range Status   C Diff antigen POSITIVE (A) NEGATIVE Final   C Diff toxin POSITIVE (A) NEGATIVE Final  C Diff interpretation Toxin producing C. difficile detected.  Final    Comment: CRITICAL RESULT CALLED TO, READ BACK BY AND VERIFIED WITH: MEDFORD MATT RN 1027 08/15/24 HNM Performed at [facility], [address] [place], [place] [address]     Lines and Device Date on insertion # of days DC  Central line     Foley     ETT       IMAGING RESULTS: I have personally reviewed the films ? Impression/Recommendation ? ?Cdiff diarrhea in a patient with total colectomy and has end ileostomy The pathology specimen in April when she had colectomy showed extensive pseudomembrane in the colon and also involved the appendix At that time patient was only treated till she had colectomy- post colectomy vanco was stopped So this time will treat her completely for 10 days with fidoximicin 200mg  BID This is recurrent cdiff  Gi bleed- recurrent compbnation of AVM, esophageal varices Circulatory shock resolved Acute blood loss anemia- received units of PRBC- stable  Cirrhosis liver with thrombocytopenia, esophageal varices  HFrEF  Ischemic cardiomyopathy CABG Cor triatutum repair Pulmonary HTN Atrial fibrillation  Watchman  Hypothyroidism  Avoid antibiotics This consult involved complex antimicrobial management? I have personally spent  -60--minutes involved in face-to-face and non-face-to-face activities for this patient on the day of the visit. Professional time spent includes the following activities: Preparing to see the patient (review of tests), Obtaining and/or reviewing separately obtained history (admission/discharge record), Performing a medically appropriate examination and/or evaluation , Ordering medications/tests/procedures, referring and communicating with other health  care professionals, Documenting clinical information in the EMR, Independently interpreting results (not separately reported), Communicating results to the patient/family/caregiver, Counseling and educating the patient/family/caregiver and Care coordination (not separately reported) with ID pharmacist  ID will sign off- call if needed.    ________________________________________________

## 2024-08-19 NOTE — Progress Notes (Signed)
 PHARMACY CONSULT NOTE - ELECTROLYTES  Pharmacy Consult for Electrolyte Monitoring and Replacement   Recent Labs: Height: 5' 1 (154.9 cm) Weight: 58.2 kg (128 lb 4.9 oz) IBW/kg (Calculated) : 47.8 Estimated Creatinine Clearance: 39.6 mL/min (A) (by C-G formula based on SCr of 1.1 mg/dL (H)). Potassium (mmol/L)  Date Value  08/19/2024 4.0   Magnesium  (mg/dL)  Date Value  90/79/7974 1.9   Calcium  (mg/dL)  Date Value  90/77/7974 8.0 (L)   Albumin  (g/dL)  Date Value  90/77/7974 3.2 (L)  05/18/2020 4.7   Phosphorus (mg/dL)  Date Value  90/77/7974 2.1 (L)   Sodium (mmol/L)  Date Value  08/19/2024 135   Corrected Ca: 9.2 mg/dL  Assessment  Tammy Arias is a 68 y.o. female presenting with bleeding from her ileostomy. PMH significant for ischemic cardiomyopathy . Pharmacy has been consulted to monitor and replace electrolytes.  Diet: Thin MIVF:  Pertinent medications: Lasix  80 mg BID IV  Goal of Therapy: K>4, Mag>2 given cardiac history   Plan:  Kphos 2 tabs x 2. Pt transferred to 2A from ICU. Pharmacy will sign off. Please re-consult if needed.   Thank you for allowing pharmacy to be a part of this patient's care.  Cathaleen Blanch, PharmD, BCPS [facility]  08/19/2024 7:37 AM

## 2024-08-19 NOTE — TOC CM/SW Note (Signed)
 Transition of Care [facility]) - Inpatient Brief Assessment   Patient Details  Name: Tammy Arias MRN: [id number] Date of Birth: 1956-10-10  [facility] CM/SW Contact:    Alfonso Rummer, LCSW Phone Number: 08/19/2024, 5:38 PM   Clinical Narrative: Chart review completed no toc needs at this time    Transition of Care Asessment:

## 2024-08-19 NOTE — Plan of Care (Signed)
  Problem: Education: Goal: Knowledge of General Education information will improve Description: Including pain rating scale, medication(s)/side effects and non-pharmacologic comfort measures Outcome: Progressing   Problem: Health Behavior/Discharge Planning: Goal: Ability to manage health-related needs will improve Outcome: Progressing   Problem: Clinical Measurements: Goal: Ability to maintain clinical measurements within normal limits will improve Outcome: Progressing Goal: Will remain free from infection Outcome: Progressing Goal: Diagnostic test results will improve Outcome: Progressing Goal: Respiratory complications will improve Outcome: Progressing Goal: Cardiovascular complication will be avoided Outcome: Progressing   Problem: Elimination: Goal: Will not experience complications related to bowel motility Outcome: Progressing Goal: Will not experience complications related to urinary retention Outcome: Progressing   Problem: Pain Managment: Goal: General experience of comfort will improve and/or be controlled Outcome: Progressing   Problem: Skin Integrity: Goal: Risk for impaired skin integrity will decrease Outcome: Progressing   Problem: Safety: Goal: Ability to remain free from injury will improve Outcome: Progressing

## 2024-08-19 NOTE — Telephone Encounter (Signed)
 Pt called and stated the heart doctor came in and told her she will not be discharged today. Pt canceled MD appt today

## 2024-08-19 NOTE — Progress Notes (Addendum)
 Progress Note   Patient: Tammy Arias FMW:[id number] DOB: February 06, 1956 DOA: 08/14/2024     5 DOS: the patient was seen and examined on 08/19/2024   Brief hospital course:  68 yo F presenting to [facility] ED from home for evaluation of GI bleeding, was admitted to ICU on 08/14/24.  See H&P and PCCM notes for full HPI on admission and hospital course to date. Significant events below.  08/14/24: Admit to ICU due to circulatory shock secondary to acute blood loss anemia in the setting of pancytopenia and chronic GIB requiring vasopressor support. 08/15/24: Pts hgb remains stable following transfusion of 2 units pRBC's, however she's still requiring levophed  gtt @9  mcg/min to maintain map >65.  Per GI recommendations if she has active bleeding will place order for tagged bleeding scan  08/16/24: No significant events overnight.  Hgb continues to slowly trend down but no reports of bleeding, Tagged RBC scan pending.  Started on Dobutamine  overnight due to Coox 55, improved to 63 this morning with normal lactic acid, Levophed  requirements improving (down to 9 mcg); repeat Echocardiogram is pending. Lasix  80 mg BID initiated by CHF team. 08/17/24: remains on dobutamine , off nor-epinephrine . Another episode of GI bleed overnight. Tagged RBC scan negative  Hospitalist service assumed care 08/18/24.  Further hospital course and management as outlined below.   Assessment and Plan:  Circulatory shock (mixed distributive, hemorrhagic, cardiogenic) - resolved.  Pt weaned off pressors as of 9/20. --Maintain MAP > 65 --Monitor hemodynamics closely  Acute blood loss anemia Acute GI bleeding Hx of Gastric AVM Portal hypertensive gastropathy - complication of cirrhosis.   Bleeding source this admission has not been identified despite multiple via imaging and endoscopy.  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  --Enteric precautions --Continue PO Vancomycin  --Monitor stool output, volume status --Cautious volume resuscitation as needed --Will consult ID tomorrow for treatment recommendations   Paroxysmal A-Fib s/p watchman device (not on chronic DOAC due to GIB) Acute on Chronic HFrEF (04/2024: LVEF 30%) Ischemic cardiomyopathy Severe Mitral Regurgitation Severe Tricuspid Regurgitation CAD s/p CABG (2005) - stable, no chest pain HLD HTN --Heart Failure team was following in ICU --On Lasix  80 mg IV bid  --Monitor volume status & electrolytes --Holding: Jardiance , spironolactone  --Off Plavix with bleeding   Pulmonary Hypertension --Monitor & supplement O2 as needed  T2DM --Sliding scale Novolog   Pancytopenia (followed by Heme/onc) --Monitor CBC  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  She denies signs of blood in her ostomy today, but had multiple episodes yesterday that started after she ate lunch.  No other acute complaints.  She is agreeable to further blood  transfusion if needed.   Physical Exam: Vitals:   08/19/24 0414 08/19/24 0500 08/19/24 0807 08/19/24 1144  BP: (!) 110/59  (!) 124/57 126/75  Pulse: 73  80 90  Resp: 17   17 18   Temp: 98 F (36.7 C)  98 F (36.7 C) 98.2 F (36.8 C)  TempSrc:   Oral   SpO2: 99%  100% 100%  Weight:  58.2 kg    Height:       General exam: awake, alert, no acute distress HEENT: moist mucus membranes, hearing grossly normal  Respiratory system: on room air, normal respiratory effort. Cardiovascular system: normal S1/S2, RRR, no pedal edema.   Gastrointestinal system: soft, colostomy with liquid stool Central nervous system: A&O x 3. no gross focal neurologic deficits, normal speech Extremities: moves all, no edema, normal tone Skin: dry, intact, normal temperature Psychiatry: normal mood, congruent affect, judgement and insight appear normal    Data Reviewed:  Notable labs -- glucose 182, BUN 35, Cr 1.10, Ca 8.0, albumin  3.2, Hbg trend 9.8 >> 9.2 >> 9.4 >> 8.5 today Platelets improved 66 >> 78  Family Communication:  none present. Pt updated in detail  Disposition: Status is: Inpatient Remains inpatient appropriate because: closely monitoring for further bleeding, remains on IV lasix , Hbg declined this AM may require further blood transfusion   Planned Discharge Destination: Home    Time spent: 42 minutes  Author: Burnard DELENA Cunning, DO 08/19/2024 2:57 PM  For on call review [email or web address].

## 2024-08-19 NOTE — Plan of Care (Signed)
  Problem: Clinical Measurements: Goal: Diagnostic test results will improve Outcome: Adequate for Discharge Goal: Respiratory complications will improve Outcome: Adequate for Discharge Goal: Cardiovascular complication will be avoided Outcome: Adequate for Discharge   Problem: Activity: Goal: Risk for activity intolerance will decrease Outcome: Adequate for Discharge

## 2024-08-19 NOTE — Telephone Encounter (Signed)
 She will need iv iron  upon discharge. Not sure when she is getting discharged. I will keep an eye out for it

## 2024-08-19 NOTE — Progress Notes (Signed)
 Patient ID: Tammy Arias, female   DOB: 12-Jan-1956, 68 y.o.   MRN: [id number]     Advanced Heart Failure Rounding Note  Cardiologist: None  Chief Complaint: CHF Subjective:     Off DBA and NE. Co-ox this am 88% (likely inaccurate)   Continues with dark stools/blood in ileostomy  Tagger RBC scan negative Hgb 9.4 -> 8.5   Remains off AC  Getting lasix  80 IV bid  Weight down 9 pounds over past few days SCr stable at 1.1  Denies SOB,orthopnea or PND  ECHO this admit  EF 35-40% range, severe RV dilation with moderate RV dysfunction and mildly D-shaped IV septum, s/p mTEER with 1 Mitraclip and residual moderate MR (mean gradient 5), severe TR, IVC not visualized.    Objective:   Weight Range: 58.2 kg Body mass index is 24.24 kg/m.   Vital Signs:   Temp:  [97.8 F (36.6 C)-98.1 F (36.7 C)] 98 F (36.7 C) (09/22 0807) Pulse Rate:  [67-86] 80 (09/22 0807) Resp:  [15-21] 17 (09/22 0807) BP: (91-151)/(50-79) 124/57 (09/22 0807) SpO2:  [98 %-100 %] 100 % (09/22 0807) Weight:  [58.2 kg] 58.2 kg (09/22 0500) Last BM Date : 08/18/24  Weight change: Filed Weights   08/17/24 0500 08/18/24 0500 08/19/24 0500  Weight: 61 kg 60.4 kg 58.2 kg    Intake/Output:   Intake/Output Summary (Last 24 hours) at 08/19/2024 0908 Last data filed at 08/18/2024 1943 Gross per 24 hour  Intake 310.61 ml  Output 1900 ml  Net -1589.39 ml      Physical Exam    General:  Sitting up in bed  No resp difficulty HEENT: normal Neck: supple. JVP 6 Carotids 2+ bilat; no bruits. No lymphadenopathy or thryomegaly appreciated. Cor: PMI nondisplaced. Irregular rate & rhythm.2/6 TR Lungs: clear Abdomen: soft, nontender, nondistended. + ileostomy. Good bowel sounds. Extremities: no cyanosis, clubbing, rash, edema Neuro: alert & orientedx3, cranial nerves grossly intact. moves all 4 extremities w/o difficulty. Affect pleasant   Telemetry   Atrial fibrillation rate 80s Personally reviewed  Labs     CBC Recent Labs    08/18/24 0514 08/18/24 1610 08/19/24 0603  WBC 4.7  --  5.4  HGB 9.2* 9.4* 8.5*  HCT 27.2* 27.9* 24.4*  MCV 93.2  --  92.8  PLT 66*  --  78*   Basic Metabolic Panel Recent Labs    90/79/74 0303 08/18/24 0514 08/19/24 0603  NA 134* 131* 135  K 3.3* 3.7 4.0  CL 101 100 101  CO2 23 22 24   GLUCOSE 231* 141* 182*  BUN 26* 31* 35*  CREATININE 1.02* 1.13* 1.10*  CALCIUM  7.7* 7.8* 8.0*  MG 1.9  --   --   PHOS 3.9 3.2 2.1*   Liver Function Tests Recent Labs    08/18/24 0514 08/19/24 0603  ALBUMIN  3.4* 3.2*   No results for input(s): LIPASE, AMYLASE in the last 72 hours. Cardiac Enzymes No results for input(s): CKTOTAL, CKMB, CKMBINDEX, TROPONINI in the last 72 hours.  BNP: BNP (last 3 results) Recent Labs    03/17/24 1723 06/07/24 1929 08/15/24 1022  BNP 166.8* 125.4* 391.6*    ProBNP (last 3 results) No results for input(s): PROBNP in the last 8760 hours.   D-Dimer No results for input(s): DDIMER in the last 72 hours. Hemoglobin A1C No results for input(s): HGBA1C in the last 72 hours. Fasting Lipid Panel No results for input(s): CHOL, HDL, LDLCALC, TRIG, CHOLHDL, LDLDIRECT in the last 72 hours. Thyroid   Function Tests No results for input(s): TSH, T4TOTAL, T3FREE, THYROIDAB in the last 72 hours.  Invalid input(s): FREET3  Other results:   Imaging    No results found.    Medications:     Scheduled Medications:  sodium chloride    Intravenous Once   vitamin C   500 mg Oral BID   Chlorhexidine  Gluconate Cloth  6 each Topical Daily   feeding supplement (GLUCERNA SHAKE)  237 mL Oral TID BM   furosemide   80 mg Intravenous BID   hydrocortisone  sod succinate (SOLU-CORTEF ) inj  50 mg Intravenous Q8H   insulin  aspart  0-5 Units Subcutaneous QHS   insulin  aspart  0-9 Units Subcutaneous TID WC   latanoprost   1 drop Both Eyes QHS   levothyroxine   50 mcg Oral Q0600   multivitamin with  minerals  1 tablet Oral Daily   pantoprazole  (PROTONIX ) IV  40 mg Intravenous Q12H   phosphorus  500 mg Oral Q4H   sodium chloride  flush  10-40 mL Intracatheter Q12H   vancomycin   125 mg Oral Q6H    Infusions:    PRN Medications: docusate sodium , hydrOXYzine , mouth rinse, polyethylene glycol, sodium chloride  flush   Assessment/Plan   1. Shock: Suspect mixed hemorrhagic, cardiogenic, septic/distributive shock. Lactate 2.5 initially, now down to 0.7. Baseline ischemic cardiomyopathy, I reviewed today's echo at bedside, EF 35-40% range, severe RV dilation with moderate RV dysfunction and mildly D-shaped IV septum, s/p mTEER with 1 Mitraclip and residual moderate MR (mean gradient 5), severe TR, IVC not visualized.  She also was admitted with GI bleeding requiring transfusion and was positive for C difficile again and may have a component of distributive shock.  - Now off NE and DBA. - Hemodynamically stable.  2. Acute on chronic systolic CHF:  Ischemic cardiomyopathy but there is prominent RV failure as well with severe TR.  Echo in 6/25 showed  EF 30%, normal RV size/systolic function, s/p mTEER with 1 XTW Mitraclip with residual moderate-severe MR, severe TR, severe biatrial enlargement, PASP 58 mmHg.  I reviewed echo this admission at bedside, EF 35-40% range, severe RV dilation with moderate RV dysfunction and mildly D-shaped IV septum, s/p mTEER with 1 Mitraclip and residual moderate MR (mean gradient 5), severe TR, IVC not visualized. GDMT has been limited historically by low BP, she has had to take midodrine  at times.  - Off DBA and NE  - Volume status looks good. Stop IV lasix  - Restart Jardiance  and spironolactone   - Start torsemide  20 daily alternating with 10 daily 2. Atrial fibrillation: She appears to be in atrial fibrillation today.  No anticoagulation given Watchman. She is now off Plavix as well with GI bleeding.  - Rate controlled. No AC or ASA with bleeding 4. AKI:  Creatinine up to 1.77, baseline 1.1.  Suspect due to shock.  - resolved. Scr 1.1 5. CAD: S/p CABG in 2005. No s/s angina 6. Cirrhosis: NAFLD with portal hypertension (esophageal varices).  7. Acute blood loss anemia due to GI bleeding: In setting of AVMs and portal hypertension.  Recurrent GI bleed this admission.  She is not on any antiplatelet or anticoagulant meds at this time.  - GI following. Tagged RBC scan negative - options limites - Octreotide  infusion per primary team.  8. Valvular heart disease: S/p mTEER, had residual moderate-severe MR as well as severe TR on last echo.  -  echo this admission, she has severe TR and moderate residual MR s/p mTEER.  9. C difficile colitis: Fulminant  C difficile infection in 4/25 with colectomy/end ileostomy. She is C difficile positive again this admission.  - Vancomycin /Flagyl  per primary.    She is stable from a HF/volume status. Recs as above. AHF team will sign off. Please call with questions.   Patient has outpatient f/u in [facility] of Stay: 5  Toribio Fuel, MD  08/19/2024, 9:08 AM  Advanced Heart Failure Team Pager [phone] (M-F; 7a - 5p)  Please contact [facility] for night-coverage after hours (5p -7a ) and weekends on [email or web address]

## 2024-08-19 NOTE — Telephone Encounter (Signed)
 Voicemail received from patient today 08/19/24 at 10:04AM indicating she's currently hospitalized (gastrointestinal hemorrage) and was scheduled for an appointment today at 1:15pm.  Was told that the doctor's would reach out to Dr. Melanee and discuss.  Patient is requesting a call back [phone].  Dr. Melanee I didn't know if you've touched based with the hospitalist already.

## 2024-08-19 NOTE — Inpatient Diabetes Management (Signed)
 Inpatient Diabetes Program Recommendations  AACE/ADA: New Consensus Statement on Inpatient Glycemic Control   Target Ranges:  Prepandial:   less than 140 mg/dL      Peak postprandial:   less than 180 mg/dL (1-2 hours)      Critically ill patients:  140 - 180 mg/dL    Latest Reference Range & Units 08/18/24 07:34 08/18/24 11:29 08/18/24 16:12 08/18/24 22:40 08/19/24 08:05  Glucose-Capillary 70 - 99 mg/dL 797 (H) 732 (H) 737 (H) 254 (H) 201 (H)   Review of Glycemic Control  Diabetes history: DM2 Outpatient Diabetes medications: Jardiance  10 mg daily, Metformin  1000 mg BID Current orders for Inpatient glycemic control: Novolog  0-9 units TID with meals, Novolog  0-5 units at bedtime; Solucortef 50 mg Q8H  Inpatient Diabetes Program Recommendations:    Insulin : If steroids are continued as ordered, please consider ordering Novolog  4 units TID with meals for meal coverage if patient eats at least 50% of meals.  Thanks, Earnie Gainer, RN, MSN, CDCES Diabetes Coordinator Inpatient Diabetes Program [phone] (Team Pager from 8am to 5pm)

## 2024-08-20 DIAGNOSIS — R578 Other shock: Secondary | ICD-10-CM | POA: Diagnosis not present

## 2024-08-20 LAB — GLUCOSE, CAPILLARY
Glucose-Capillary: 118 mg/dL — ABNORMAL HIGH (ref 70–99)
Glucose-Capillary: 246 mg/dL — ABNORMAL HIGH (ref 70–99)
Glucose-Capillary: 130 mg/dL — ABNORMAL HIGH (ref 70–99)
Glucose-Capillary: 222 mg/dL — ABNORMAL HIGH (ref 70–99)

## 2024-08-20 LAB — RENAL FUNCTION PANEL
Albumin: 3.2 g/dL — ABNORMAL LOW (ref 3.5–5.0)
Anion gap: 9 (ref 5–15)
BUN: 31 mg/dL — ABNORMAL HIGH (ref 8–23)
CO2: 25 mmol/L (ref 22–32)
Calcium: 7.8 mg/dL — ABNORMAL LOW (ref 8.9–10.3)
Chloride: 103 mmol/L (ref 98–111)
Creatinine, Ser: 0.95 mg/dL (ref 0.44–1.00)
GFR, Estimated: >60 mL/min (ref >60)
Glucose, Bld: 119 mg/dL — ABNORMAL HIGH (ref 70–99)
Phosphorus: 2.3 mg/dL — ABNORMAL LOW (ref 2.5–4.6)
Potassium: 3.4 mmol/L — ABNORMAL LOW (ref 3.5–5.1)
Sodium: 137 mmol/L (ref 135–145)

## 2024-08-20 LAB — CBC
HCT: 26.8 % — ABNORMAL LOW (ref 36.0–46.0)
Hemoglobin: 9 g/dL — ABNORMAL LOW (ref 12.0–15.0)
MCH: 31.5 pg (ref 26.0–34.0)
MCHC: 33.6 g/dL (ref 30.0–36.0)
MCV: 93.7 fL (ref 80.0–100.0)
Platelets: 66 K/uL — ABNORMAL LOW (ref 150–400)
RBC: 2.86 MIL/uL — ABNORMAL LOW (ref 3.87–5.11)
RDW: 18.4 % — ABNORMAL HIGH (ref 11.5–15.5)
WBC: 3.5 K/uL — ABNORMAL LOW (ref 4.0–10.5)
nRBC: 0.6 % — ABNORMAL HIGH (ref 0.0–0.2)

## 2024-08-20 NOTE — Plan of Care (Signed)

## 2024-08-20 NOTE — Progress Notes (Signed)
 Progress Note   Patient: Tammy Arias FMW:[id number] DOB: [birth year] DOA: 08/14/2024     6 DOS: the patient was seen and examined on 08/20/2024   Brief hospital course:  68 yo F presenting to [facility] ED from home for evaluation of GI bleeding, was admitted to ICU on 08/14/24.  See H&P and PCCM notes for full HPI on admission and hospital course to date. Significant events below.  08/14/24: Admit to ICU due to circulatory shock secondary to acute blood loss anemia in the setting of pancytopenia and chronic GIB requiring vasopressor support. 08/15/24: Pts hgb remains stable following transfusion of 2 units pRBC's, however she's still requiring levophed  gtt @9  mcg/min to maintain map >65.  Per GI recommendations if she has active bleeding will place order for tagged bleeding scan  08/16/24: No significant events overnight.  Hgb continues to slowly trend down but no reports of bleeding, Tagged RBC scan pending.  Started on Dobutamine  overnight due to Coox 55, improved to 63 this morning with normal lactic acid, Levophed  requirements improving (down to 9 mcg); repeat Echocardiogram is pending. Lasix  80 mg BID initiated by CHF team. 08/17/24: remains on dobutamine , off nor-epinephrine . Another episode of GI bleed overnight. Tagged RBC scan negative  Hospitalist service assumed care 08/18/24.  Further hospital course and management as outlined below.   Assessment and Plan:  Circulatory shock (mixed distributive, hemorrhagic, cardiogenic) - resolved.  Pt weaned off pressors as of 9/20. --Maintain MAP > 65 --Monitor hemodynamics closely  Acute blood loss anemia Acute GI bleeding Hx of Gastric AVM Portal hypertensive gastropathy - complication of cirrhosis.   Bleeding source this admission has not been identified despite multiple via imaging and endoscopy.  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  --Enteric precautions --Initially on PO Vancomycin  --ID consulted --Now on Dificid   --Monitor stool output, volume status  Paroxysmal A-Fib s/p watchman device (not on chronic DOAC due to GIB) Acute on Chronic HFrEF (04/2024: LVEF 30%) Ischemic cardiomyopathy Severe Mitral Regurgitation Severe Tricuspid Regurgitation CAD s/p CABG (2005) - stable, no chest pain HLD HTN --Heart Failure team was following in ICU --Treated Lasix  80 mg IV bid which is now stopped --Monitor volume status & electrolytes --Holding: Jardiance , spironolactone  --Off Plavix with bleeding   Pulmonary Hypertension --Monitor & supplement O2 as needed  T2DM --Sliding scale Novolog   Pancytopenia (followed by Heme/onc) --Monitor CBC  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  She reports not having any bleeding overnight or today yet.  Feels better since blood transfusion yesterday.  She denies  other complaints & hopes she'll be able to go home soon.   Physical Exam: Vitals:   08/20/24 0341 08/20/24 0501 08/20/24 0820 08/20/24 1133  BP: (!) 124/58  138/71 137/69  Pulse:  64  73 81  Resp: 18  19   Temp: 97.7 F (36.5 C)  98.7 F (37.1 C)   TempSrc:      SpO2: 99%  100% 99%  Weight:  58.2 kg    Height:       General exam: awake, alert, no acute distress HEENT: moist mucus membranes, hearing grossly normal  Respiratory system: on room air, normal respiratory effort. Cardiovascular system: normal S1/S2, RRR, no pedal edema.   Gastrointestinal system: soft, colostomy with liquid stool Central nervous system: A&O x 3. no gross focal neurologic deficits, normal speech Extremities: moves all, no edema, normal tone Skin: dry, intact, normal temperature Psychiatry: normal mood, congruent affect, judgement and insight appear normal    Data Reviewed:  Notable labs -- K 3.4, glucose 119, BUN 31, Ca 7.8, albumin  3.2, Hbg trend 9.8 >> 9.2 >> 9.4 >> 8.5 >> 7.9 (RBC transfusion) >> 9.0 this AM    Platelets improved 66     Family Communication:  none present. Pt updated in detail  Disposition: Status is: Inpatient Remains inpatient appropriate because: closely monitoring for further bleeding and Hbg stability for 24-48 hours to minimize risk of readmission.   Planned Discharge Destination: Home    Time spent: 38 minutes  Author: Burnard DELENA Cunning, DO 08/20/2024 2:05 PM  For on call review [email or web address].

## 2024-08-21 ENCOUNTER — Inpatient Hospital Stay

## 2024-08-21 DIAGNOSIS — E440 Moderate protein-calorie malnutrition: Secondary | ICD-10-CM | POA: Diagnosis not present

## 2024-08-21 DIAGNOSIS — N179 Acute kidney failure, unspecified: Secondary | ICD-10-CM | POA: Diagnosis not present

## 2024-08-21 DIAGNOSIS — I5020 Unspecified systolic (congestive) heart failure: Secondary | ICD-10-CM | POA: Diagnosis not present

## 2024-08-21 DIAGNOSIS — R578 Other shock: Secondary | ICD-10-CM | POA: Diagnosis not present

## 2024-08-21 LAB — TYPE AND SCREEN
ABO/RH(D): A NEG
Antibody Screen: POSITIVE
DAT, IgG: NEGATIVE
DAT, complement: NEGATIVE
Unit division: 0
Unit division: 0
Unit division: 0
Unit division: 0
Unit division: 0
Unit division: 0
Unit division: 0

## 2024-08-21 LAB — CBC
HCT: 26.9 % — ABNORMAL LOW (ref 36.0–46.0)
Hemoglobin: 9.2 g/dL — ABNORMAL LOW (ref 12.0–15.0)
MCH: 31.9 pg (ref 26.0–34.0)
MCHC: 34.2 g/dL (ref 30.0–36.0)
MCV: 93.4 fL (ref 80.0–100.0)
Platelets: 58 K/uL — ABNORMAL LOW (ref 150–400)
RBC: 2.88 MIL/uL — ABNORMAL LOW (ref 3.87–5.11)
RDW: 19.1 % — ABNORMAL HIGH (ref 11.5–15.5)
WBC: 2.5 K/uL — ABNORMAL LOW (ref 4.0–10.5)
nRBC: 0.8 % — ABNORMAL HIGH (ref 0.0–0.2)

## 2024-08-21 LAB — RENAL FUNCTION PANEL
Albumin: 3 g/dL — ABNORMAL LOW (ref 3.5–5.0)
Anion gap: 8 (ref 5–15)
BUN: 29 mg/dL — ABNORMAL HIGH (ref 8–23)
CO2: 26 mmol/L (ref 22–32)
Calcium: 8 mg/dL — ABNORMAL LOW (ref 8.9–10.3)
Chloride: 104 mmol/L (ref 98–111)
Creatinine, Ser: 1 mg/dL (ref 0.44–1.00)
GFR, Estimated: >60 mL/min (ref >60)
Glucose, Bld: 122 mg/dL — ABNORMAL HIGH (ref 70–99)
Phosphorus: 3.1 mg/dL (ref 2.5–4.6)
Potassium: 3.3 mmol/L — ABNORMAL LOW (ref 3.5–5.1)
Sodium: 138 mmol/L (ref 135–145)

## 2024-08-21 LAB — BPAM RBC
Blood Product Expiration Date: 202509212359
Blood Product Expiration Date: 202509212359
Blood Product Expiration Date: 202509222359
Blood Product Expiration Date: 202510292359
Blood Product Unit Number: 202510022359
ISSUE DATE / TIME: 202509171730
ISSUE DATE / TIME: 202509171911
ISSUE DATE / TIME: 202509181800
ISSUE DATE / TIME: 202509192036
ISSUE DATE / TIME: 202509222219
ISSUE DATE / TIME: 202510072359
Unit Type and Rh: 202510022359
Unit Type and Rh: 202510072359
Unit Type and Rh: 202510292359
Unit Type and Rh: 600
Unit Type and Rh: 600
Unit Type and Rh: 600
Unit Type and Rh: 600
Unit Type and Rh: 600
Unit Type and Rh: 9500

## 2024-08-21 LAB — COOXEMETRY PANEL
Carboxyhemoglobin: 1.5 % (ref 0.5–1.5)
Methemoglobin: <0.7 % (ref 0.0–1.5)
O2 Saturation: 60.2 %
Total hemoglobin: 9.8 g/dL — ABNORMAL LOW (ref 12.0–16.0)
Total oxygen content: 59.3 %

## 2024-08-21 LAB — GLUCOSE, CAPILLARY
Glucose-Capillary: 121 mg/dL — ABNORMAL HIGH (ref 70–99)
Glucose-Capillary: 219 mg/dL — ABNORMAL HIGH (ref 70–99)
Glucose-Capillary: 246 mg/dL — ABNORMAL HIGH (ref 70–99)
Glucose-Capillary: 263 mg/dL — ABNORMAL HIGH (ref 70–99)

## 2024-08-21 LAB — VITAMIN K1, SERUM: VITAMIN K1: <0.1 ng/mL — ABNORMAL LOW (ref 0.10–2.20)

## 2024-08-21 MED ORDER — IOHEXOL 350 MG/ML SOLN
100.0000 mL | Freq: Once | INTRAVENOUS | Status: AC | PRN
  Administered 2024-08-21: 100 mL via INTRAVENOUS

## 2024-08-21 NOTE — Progress Notes (Signed)
 Progress Note   Patient: Tammy Arias FMW:[id number] DOB: [birth year] DOA: 08/14/2024     7 DOS: the patient was seen and examined on 08/21/2024   Brief hospital course:  68 yo F presenting to [facility] ED from home for evaluation of GI bleeding, was admitted to ICU on 08/14/24.  See H&P and PCCM notes for full HPI on admission and hospital course to date. Significant events below.  08/14/24: Admit to ICU due to circulatory shock secondary to acute blood loss anemia in the setting of pancytopenia and chronic GIB requiring vasopressor support. 08/15/24: Pts hgb remains stable following transfusion of 2 units pRBC's, however she's still requiring levophed  gtt @9  mcg/min to maintain map >65.  Per GI recommendations if she has active bleeding will place order for tagged bleeding scan  08/16/24: No significant events overnight.  Hgb continues to slowly trend down but no reports of bleeding, Tagged RBC scan pending.  Started on Dobutamine  overnight due to Coox 55, improved to 63 this morning with normal lactic acid, Levophed  requirements improving (down to 9 mcg); repeat Echocardiogram is pending. Lasix  80 mg BID initiated by CHF team. 08/17/24: remains on dobutamine , off nor-epinephrine . Another episode of GI bleed overnight. Tagged RBC scan negative  Hospitalist service assumed care 08/18/24.  Further hospital course and management as outlined below.  9/24: reported another bleed last night around 3 am. CT Angio to eval for active bleed   Assessment and Plan:  Circulatory shock (mixed distributive, hemorrhagic, cardiogenic) - resolved.  Pt weaned off pressors as of 9/20. - now resolved  Acute blood loss anemia Acute GI bleeding Hx of Gastric AVM Portal hypertensive gastropathy - complication of cirrhosis.   Bleeding source this admission has not been identified despite multiple via imaging and endoscopy.  GI suspects potentially her portal hypertensive gastropathy, but pt would be poor candidate for  TIPS with her underlying HFrEF and pulmonary HTN 9/20 -- Hbg dropped to 8.5 this AM, from 9.4 yesterday after recurrent bleeding yesterday 9/22 -- 1 unit RBC's for Hbg 7.9 after multiple bleeding episodes day before --GI was consulted, have signed off --Trend Hbg & transfuse if < 8 or active rapid bleeding - on 08/21/24 around 3 am she had another bleed (per pt) mixed with the stool - she is worried about active bleed - agreeable for CT Angio - no Active bleed --Hold anticoagulation --Continue PPI - H & H stable --Was on octreotide  drip in ICU - now off  C diff infection, 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  --Enteric precautions --Initially on PO Vancomycin  --ID following --Now on Dificid  - ID recommends total 10 days with fidoximicin 200mg  BID  --Monitor stool output, volume status  Paroxysmal A-Fib s/p watchman device (not on chronic DOAC due to GIB) Acute on Chronic HFrEF (04/2024: LVEF 30%) Ischemic cardiomyopathy Severe Mitral Regurgitation Severe Tricuspid Regurgitation CAD s/p CABG (2005) - stable, no chest pain HLD HTN --Heart Failure team was following in ICU --Treated Lasix  80 mg IV bid which is now stopped --Monitor volume status & electrolytes --Off Plavix with bleeding   Pulmonary Hypertension --Monitor & supplement O2 as needed  T2DM --Sliding scale Novolog   Pancytopenia (followed by Heme/onc) --Monitor CBC  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  She reports having another bleeding overnight around 3 am and concerned  about active bleed. Agreeable for CT Angio   Physical Exam: Vitals:   08/21/24 0746 08/21/24 1130 08/21/24 1559 08/21/24 2039  BP: 134/72 (!) 123/59 137/63 (!) 108/56  Pulse: 67 82 81 76  Resp: 17 18 18 18   Temp: 98.1 F (36.7 C) 98 F (36.7 C) 98.2 F (36.8 C) 98.7 F (37.1 C)  TempSrc: Oral Oral Oral   SpO2: 100% 100% 99% 99%  Weight:      Height:       General exam: awake, alert, no acute distress HEENT: moist mucus membranes, hearing grossly normal  Respiratory system: on room air, normal respiratory effort. Cardiovascular system: normal S1/S2, RRR, no pedal edema.   Gastrointestinal system: soft, colostomy with liquid stool Central nervous system: A&O x 3. no gross focal neurologic deficits, normal speech Extremities: moves all, no edema, normal tone Skin: dry, intact, normal temperature Psychiatry: normal mood, congruent affect, judgement and insight appear normal    Data Reviewed:  Notable labs -- K 3.4, glucose 119, BUN 31, Ca 7.8, albumin  3.2, Hbg trend 9.8 >> 9.2 >> 9.4 >> 8.5 >> 7.9 (RBC transfusion) >> 9.0 this AM     Family Communication:  none present. Pt updated in detail  Disposition: Status is: Inpatient Remains inpatient appropriate because: closely monitoring for further bleeding and Hbg stability for 24 hours to minimize risk of readmission.   Planned Discharge Destination: Home  tomorrow if no further bleeding    Time spent: 35 minutes  Author: Cresencio Fairly, MD 08/21/2024 8:39 PM  For on call review [email or web address].

## 2024-08-21 NOTE — Progress Notes (Signed)
 The patient has BM mixed with bright red blood. She's asymptomatic, Notified Dr. Cleatus without any new order but to pass it on to day shift. Will continue to monitor.

## 2024-08-21 NOTE — Plan of Care (Signed)
  Problem: Education: Goal: Knowledge of General Education information will improve Description: Including pain rating scale, medication(s)/side effects and non-pharmacologic comfort measures Outcome: Progressing   Problem: Clinical Measurements: Goal: Ability to maintain clinical measurements within normal limits will improve Outcome: Progressing Goal: Will remain free from infection Outcome: Progressing Goal: Respiratory complications will improve Outcome: Progressing Goal: Cardiovascular complication will be avoided Outcome: Progressing   Problem: Activity: Goal: Risk for activity intolerance will decrease Outcome: Progressing   Problem: Pain Managment: Goal: General experience of comfort will improve and/or be controlled Outcome: Progressing   Problem: Elimination: Goal: Will not experience complications related to bowel motility Outcome: Not Progressing

## 2024-08-21 NOTE — Plan of Care (Signed)
  Problem: Education: Goal: Knowledge of General Education information will improve Description: Including pain rating scale, medication(s)/side effects and non-pharmacologic comfort measures Outcome: Progressing   Problem: Health Behavior/Discharge Planning: Goal: Ability to manage health-related needs will improve Outcome: Progressing   Problem: Clinical Measurements: Goal: Ability to maintain clinical measurements within normal limits will improve Outcome: Progressing Goal: Will remain free from infection Outcome: Progressing Goal: Cardiovascular complication will be avoided Outcome: Progressing   Problem: Nutrition: Goal: Adequate nutrition will be maintained Outcome: Progressing   Problem: Elimination: Goal: Will not experience complications related to bowel motility Outcome: Progressing Goal: Will not experience complications related to urinary retention Outcome: Progressing   Problem: Pain Managment: Goal: General experience of comfort will improve and/or be controlled Outcome: Progressing   Problem: Safety: Goal: Ability to remain free from injury will improve Outcome: Progressing   Problem: Skin Integrity: Goal: Risk for impaired skin integrity will decrease Outcome: Progressing

## 2024-08-21 NOTE — Progress Notes (Signed)
 Nutrition Follow-up  DOCUMENTATION CODES:   Non-severe (moderate) malnutrition in context of chronic illness  INTERVENTION:   -Continue 500 mg vitamin C  BID -Continue Glucerna Shake po TID, each supplement provides 220 kcal and 10 grams of protein  -Liberalize diet to carb modified for wider variety of meal selections -Continue MVI with minerals daily   NUTRITION DIAGNOSIS:   Moderate Malnutrition related to chronic illness as evidenced by moderate fat depletion, moderate muscle depletion.  Ongoing  GOAL:   Patient will meet greater than or equal to 90% of their needs  Progressing   MONITOR:   PO intake, Supplement acceptance, Labs, Weight trends, I & O's, Skin  REASON FOR ASSESSMENT:   Rounds    ASSESSMENT:   68 y/o female with h/o ischemic cardiomyopathy, gastric AVM, GI angiodysplasia, IBS-D, IDA, s/p watchman device, cholelithiasis, TIA, hypothyroidism, pulmonary hypertension, GERD, barrett's esophagus, HLD, HTN, OSA, CAD s/p CABG x 3, PAF, CHF, cirrhosis, portal hypertension, gout, CKD III, B12 deficiency, ventral hernia s/p mesh repair 2018, colonic angioectasias, diverticulosis, DDD, hiatal hernia, esophageal varices, C-diff and recent admission for pancolitis s/p exploratory laparotomy, 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  Reviewed I/O's: +1 L x 24 hours and -1.7 L since admission   Pt sitting up in bed at time of visit, pleasant and in good spirits. Pt reports that she is starving and waiting on breakfast. She shares that he has a good appetite, but expresses frustration over limitations on Heart Healthy diet, especially after being in the hospital for several days. RD liberalized diet for wider variety of meal selections. Pt very thankful. Meal completions 100%.   Pt expressed frustration over hospitalization and reports feeling fine and ready to get out of here. Per pt, she  had a blood BM last night and I continue to bleed, but they can't find out why. Emotional support provided.   Per oncology notes, pt will need IV iron  on discharge.   Wt stable since admission.   Discussed importance of good meal and supplement intake to promote healing. Reviewed plan to care and pt amenable to continue.   Medications reviewed and include aldactone  and demadex .   Labs reviewed: K: 3.3, CBGS: 118-246  (inpatient orders for glycemic control are 0-5 units insulin  aspart daily and 0-9 units insulin  aspart TID with meals). Vitamin K and vitamin C  pending. Noted DM coordinator recommendation for 4 units insulin  aspart TID with meals.   Diet Order:   Diet Order             Diet Carb Modified Fluid consistency: Thin  Diet effective now                   EDUCATION NEEDS:   Education needs have been addressed  Skin:  Skin Assessment: Reviewed RN Assessment (ecchymosis)  Last BM:  9/19-  Height:   Ht Readings from Last 1 Encounters:  08/14/24 5' 1 (1.549 m)    Weight:   Wt Readings from Last 1 Encounters:  08/21/24 56.7 kg    Ideal Body Weight:  47.7 kg  BMI:  Body mass index is 23.62 kg/m.  Estimated Nutritional Needs:   Kcal:  1600-1800kcal/day  Protein:  80-90g/day  Fluid:  1.3-1.5L/day    Margery ORN, RD, LDN, CDCES Registered Dietitian III Certified Diabetes Care and Education Specialist If unable to reach this RD, please use RD Inpatient group chat on secure chat  between hours of 8am-4 pm daily

## 2024-08-22 ENCOUNTER — Other Ambulatory Visit: Payer: Self-pay | Admitting: Internal Medicine

## 2024-08-22 ENCOUNTER — Other Ambulatory Visit: Payer: Self-pay

## 2024-08-22 ENCOUNTER — Encounter: Payer: Self-pay | Admitting: Oncology

## 2024-08-22 DIAGNOSIS — N179 Acute kidney failure, unspecified: Secondary | ICD-10-CM | POA: Diagnosis not present

## 2024-08-22 DIAGNOSIS — I5020 Unspecified systolic (congestive) heart failure: Secondary | ICD-10-CM | POA: Diagnosis not present

## 2024-08-22 DIAGNOSIS — R578 Other shock: Secondary | ICD-10-CM | POA: Diagnosis not present

## 2024-08-22 DIAGNOSIS — D500 Iron deficiency anemia secondary to blood loss (chronic): Secondary | ICD-10-CM

## 2024-08-22 DIAGNOSIS — K922 Gastrointestinal hemorrhage, unspecified: Secondary | ICD-10-CM | POA: Diagnosis not present

## 2024-08-22 LAB — BASIC METABOLIC PANEL WITH GFR
Anion gap: 9 (ref 5–15)
BUN: 23 mg/dL (ref 8–23)
CO2: 25 mmol/L (ref 22–32)
Calcium: 8.1 mg/dL — ABNORMAL LOW (ref 8.9–10.3)
Chloride: 105 mmol/L (ref 98–111)
Creatinine, Ser: 0.92 mg/dL (ref 0.44–1.00)
GFR, Estimated: >60 mL/min (ref >60)
Glucose, Bld: 140 mg/dL — ABNORMAL HIGH (ref 70–99)
Potassium: 3.6 mmol/L (ref 3.5–5.1)
Sodium: 139 mmol/L (ref 135–145)

## 2024-08-22 LAB — CBC
HCT: 24.8 % — ABNORMAL LOW (ref 36.0–46.0)
Hemoglobin: 8.1 g/dL — ABNORMAL LOW (ref 12.0–15.0)
MCH: 31.3 pg (ref 26.0–34.0)
MCHC: 32.7 g/dL (ref 30.0–36.0)
MCV: 95.8 fL (ref 80.0–100.0)
Platelets: 62 K/uL — ABNORMAL LOW (ref 150–400)
RBC: 2.59 MIL/uL — ABNORMAL LOW (ref 3.87–5.11)
RDW: 19.6 % — ABNORMAL HIGH (ref 11.5–15.5)
WBC: 2.1 K/uL — ABNORMAL LOW (ref 4.0–10.5)
nRBC: 0.9 % — ABNORMAL HIGH (ref 0.0–0.2)

## 2024-08-22 LAB — GLUCOSE, CAPILLARY: Glucose-Capillary: 139 mg/dL — ABNORMAL HIGH (ref 70–99)

## 2024-08-22 LAB — COOXEMETRY PANEL
Carboxyhemoglobin: 1.4 % (ref 0.5–1.5)
Methemoglobin: <0.7 % (ref 0.0–1.5)
O2 Saturation: 67.3 %
Total hemoglobin: 8.2 g/dL — ABNORMAL LOW (ref 12.0–16.0)
Total oxygen content: 66.4 %

## 2024-08-22 MED ORDER — FIDAXOMICIN 200 MG PO TABS
200.0000 mg | ORAL_TABLET | Freq: Two times a day (BID) | ORAL | 0 refills | Status: AC
  Filled 2024-08-22: qty 14, 7d supply, fill #0

## 2024-08-22 MED ORDER — POLYSACCHARIDE IRON COMPLEX 150 MG PO CAPS
150.0000 mg | ORAL_CAPSULE | Freq: Every day | ORAL | 2 refills | Status: AC
  Filled 2024-08-22: qty 30, 30d supply, fill #0

## 2024-08-22 MED ORDER — PHYTONADIONE 5 MG PO TABS
2.5000 mg | ORAL_TABLET | Freq: Every day | ORAL | 0 refills | Status: DC
  Filled 2024-08-22: qty 3, 6d supply, fill #0

## 2024-08-22 NOTE — Plan of Care (Signed)

## 2024-08-22 NOTE — Evaluation (Signed)
 Physical Therapy Evaluation Patient Details Name: Tammy Arias MRN: [id number] DOB: September 01, 1956 Today's Date: 08/22/2024  History of Present Illness  Pt is a 68 year old female with history of HFrEF, CVA, ileostomy, Afib (s/p watchman, now off antiplatelets) and recurrent GI bleed who presents to the hospital with weakness and blood through the stoma. MD assessment includes: circulatory shock, ABLA, GI bleeding, portal hypertensive gastropathy, C-diff, acute on chronic CHF, severe mitral and tricuspid regurgitation, pulmonary HTN, and weakness.   Clinical Impression  Pt was pleasant and motivated to participate during the session and put forth good effort throughout. Pt required no physical assistance with below functional tasks but during amb the pt presented with some mild drifting and cadence variation but no overt LOB.  Pt declined follow up PT services at discharge to address gait impairments but was agreeable to using her SPC or RW for improved stability once home. Pt reported no adverse symptoms during the session with SpO2 and HR WNL throughout on room air.            If plan is discharge home, recommend the following: A little help with walking and/or transfers;Assist for transportation   Can travel by private vehicle        Equipment Recommendations None recommended by PT  Recommendations for Other Services       Functional Status Assessment Patient has had a recent decline in their functional status and demonstrates the ability to make significant improvements in function in a reasonable and predictable amount of time.     Precautions / Restrictions Precautions Precautions: None Restrictions Weight Bearing Restrictions Per Provider Order: No Other Position/Activity Restrictions: Ileostomy      Mobility  Bed Mobility Overal bed mobility: Independent                  Transfers Overall transfer level: Independent                 General transfer  comment: Good eccentric and concentric control and stability    Ambulation/Gait Ambulation/Gait assistance: Supervision Gait Distance (Feet): 150 Feet Assistive device: None Gait Pattern/deviations: Step-through pattern, Decreased stride length, Drifts right/left Gait velocity: decreased     General Gait Details: Mild drifting left/right along with min cadence variation but generally steady with no overt LOB  Stairs            Wheelchair Mobility     Tilt Bed    Modified Rankin (Stroke Patients Only)       Balance Overall balance assessment: Needs assistance   Sitting balance-Leahy Scale: Normal     Standing balance support: No upper extremity supported, During functional activity Standing balance-Leahy Scale: Good Standing balance comment: Mild drifting and cadence variation during gait without UE support but no overt LOB                             Pertinent Vitals/Pain Pain Assessment Pain Assessment: No/denies pain    Home Living Family/patient expects to be discharged to:: Private residence Living Arrangements: Alone Available Help at Discharge: Friend(s);Available 24 hours/day Type of Home: Apartment Home Access: Level entry       Home Layout: One level Home Equipment: Agricultural consultant (2 wheels);Rollator (4 wheels);Cane - quad;Cane - single point;BSC/3in1      Prior Function Prior Level of Function : Independent/Modified Independent;History of Falls (last six months)             Mobility  Comments: Ind amb without an AD community distances, one fall in the last 6 months ADLs Comments: Ind with ADLs     Extremity/Trunk Assessment   Upper Extremity Assessment Upper Extremity Assessment: Overall WFL for tasks assessed    Lower Extremity Assessment Lower Extremity Assessment: Overall WFL for tasks assessed       Communication   Communication Communication: No apparent difficulties    Cognition Arousal: Alert Behavior  During Therapy: WFL for tasks assessed/performed   PT - Cognitive impairments: No apparent impairments                         Following commands: Intact       Cueing Cueing Techniques: Verbal cues     General Comments      Exercises     Assessment/Plan    PT Assessment Patient needs continued PT services  PT Problem List Decreased balance;Decreased activity tolerance       PT Treatment Interventions DME instruction;Gait training;Functional mobility training;Therapeutic activities;Therapeutic exercise;Balance training;Patient/family education    PT Goals (Current goals can be found in the Care Plan section)  Acute Rehab PT Goals Patient Stated Goal: To return home PT Goal Formulation: With patient Time For Goal Achievement: 09/04/24 Potential to Achieve Goals: Good    Frequency Min 2X/week     Co-evaluation               AM-PAC PT 6 Clicks Mobility  Outcome Measure Help needed turning from your back to your side while in a flat bed without using bedrails?: None Help needed moving from lying on your back to sitting on the side of a flat bed without using bedrails?: None Help needed moving to and from a bed to a chair (including a wheelchair)?: None Help needed standing up from a chair using your arms (e.g., wheelchair or bedside chair)?: None Help needed to walk in hospital room?: A Little Help needed climbing 3-5 steps with a railing? : A Little 6 Click Score: 22    End of Session Equipment Utilized During Treatment: Gait belt (Placed high under the arms to avoid ileostomy bag) Activity Tolerance: Patient tolerated treatment well Patient left: in bed;with call bell/phone within reach Nurse Communication: Mobility status PT Visit Diagnosis: Unsteadiness on feet (R26.81);Difficulty in walking, not elsewhere classified (R26.2)    Time: 9060-9041 PT Time Calculation (min) (ACUTE ONLY): 19 min   Charges:   PT Evaluation $PT Eval Moderate  Complexity: 1 Mod   PT General Charges $$ ACUTE PT VISIT: 1 Visit       D. Scott Oriana Horiuchi PT, DPT 08/22/24, 11:41 AM

## 2024-08-22 NOTE — TOC Transition Note (Addendum)
 Transition of Care [facility]) - Discharge Note   Patient Details  Name: Tammy Arias MRN: [id number] Date of Birth: 23-Jul-1956  [facility] CM/SW Contact:  Lauraine JAYSON Carpen, LCSW Phone Number: 08/22/2024, 9:37 AM   Clinical Narrative:   Patient has orders to discharge home today. No further concerns. CSW signing off.  10:05 am: Per PT, patient declined home health. No DME needs.  Final next level of care: Home/Self Care Barriers to Discharge: Barriers Resolved   Patient Goals and CMS Choice            Discharge Placement                    Patient and family notified of of transfer: 08/22/24  Discharge Plan and Services Additional resources added to the After Visit Summary for                                       Social Drivers of Health (SDOH) Interventions SDOH Screenings   Food Insecurity: No Food Insecurity (08/14/2024)  Housing: Low Risk  (08/14/2024)  Transportation Needs: No Transportation Needs (08/14/2024)  Utilities: Not At Risk (08/14/2024)  Depression (PHQ2-9): Low Risk  (08/08/2024)  Financial Resource Strain: Medium Risk (05/20/2024)   Received from [facility]  Social Connections: Moderately Integrated (08/14/2024)  Tobacco Use: Medium Risk (08/15/2024)     Readmission Risk Interventions    08/16/2024    1:36 PM 06/11/2024    4:23 PM 03/19/2024   10:52 AM  Readmission Risk Prevention Plan  Transportation Screening Complete Complete Complete  PCP or Specialist Appt within 3-5 Days   Complete  HRI or Home Care Consult   Complete  Social Work Consult for Recovery Care Planning/Counseling   Complete  Palliative Care Screening   Not Applicable  Medication Review Oceanographer) Complete Complete Complete  PCP or Specialist appointment within 3-5 days of discharge Complete    HRI or Home Care Consult  --   SW Recovery Care/Counseling Consult Complete    Palliative Care Screening Not Applicable Not Applicable    Skilled Nursing Facility Not Applicable Not Applicable

## 2024-08-22 NOTE — Telephone Encounter (Signed)
 Patient has been scheduled for IV iron .

## 2024-08-22 NOTE — Plan of Care (Signed)
 Consult placed to PMT for GOC 08/21/24 at 14:24. After reviewing chart, up to see patient and patient has been discharged. Consult canceled.

## 2024-08-22 NOTE — Progress Notes (Signed)
 Discharge instructions given to and reviewed with patient. Patient verbalized understanding of all discharge instructions, new medications and follow up care. Discharging home with friend driving her.

## 2024-08-23 LAB — BPAM RBC
Blood Product Expiration Date: 202510262359
Blood Product Expiration Date: 202510312359
Blood Product Expiration Date: 202510072359
ISSUE DATE / TIME: 202509222219
Unit Type and Rh: 600
Unit Type and Rh: 600
Unit Type and Rh: 600

## 2024-08-23 LAB — TYPE AND SCREEN
ABO/RH(D): A NEG
Antibody Screen: POSITIVE
DAT, IgG: NEGATIVE
DAT, complement: NEGATIVE
Unit division: 0
Unit division: 0
Unit division: 0
Unit division: 0
Unit division: 0

## 2024-08-23 LAB — VITAMIN C: Vitamin C: <0.1 mg/dL — ABNORMAL LOW (ref 0.4–2.0)

## 2024-08-23 NOTE — Discharge Summary (Signed)
 Physician Discharge Summary   Patient: Tammy Arias MRN: [id number] DOB: December 28, 1955  Admit date:     08/14/2024  Discharge date: 08/22/2024  Discharge Physician: Cresencio Fairly   PCP: Valora Agent, MD   Recommendations at discharge:    F/up with outpt providers as requested  Discharge Diagnoses: Principal Problem:   Hemorrhagic shock (HCC) Active Problems:   AKI (acute kidney injury)   HFrEF (heart failure with reduced ejection fraction) (HCC)   Severe mitral regurgitation   Malnutrition of moderate degree  Hospital Course: Assessment and Plan:  68 yo F presenting to [facility] ED from home for evaluation of GI bleeding, was admitted to ICU on 08/14/24.  See H&P and PCCM notes for full HPI on admission and hospital course to date. Significant events below.   08/14/24: Admit to ICU due to circulatory shock secondary to acute blood loss anemia in the setting of pancytopenia and chronic GIB requiring vasopressor support. 08/15/24: Pts hgb remains stable following transfusion of 2 units pRBC's, however she's still requiring levophed  gtt @9  mcg/min to maintain map >65.  Per GI recommendations if she has active bleeding will place order for tagged bleeding scan  08/16/24: No significant events overnight.  Hgb continues to slowly trend down but no reports of bleeding, Tagged RBC scan pending.  Started on Dobutamine  overnight due to Coox 55, improved to 63 this morning with normal lactic acid, Levophed  requirements improving (down to 9 mcg); repeat Echocardiogram is pending. Lasix  80 mg BID initiated by CHF team. 08/17/24: remains on dobutamine , off nor-epinephrine . Another episode of GI bleed overnight. Tagged RBC scan negative   Hospitalist service assumed care 08/18/24.   Further hospital course and management as outlined below.   9/24: reported another bleed last night around 3 am. CT Angio neg for active bleed     Assessment and Plan:   Circulatory shock (mixed distributive, hemorrhagic,  cardiogenic) - resolved.  Pt weaned off pressors as of 9/20. - now resolved   Acute blood loss anemia Acute GI bleeding Hx of Gastric AVM Portal hypertensive gastropathy - complication of cirrhosis.   Bleeding source this admission has not been identified despite multiple via imaging and endoscopy.  GI suspects potentially her portal hypertensive gastropathy, but pt would be poor candidate for TIPS with her underlying HFrEF and pulmonary HTN 9/20 -- Hbg dropped to 8.5  from 9.4 yesterday after recurrent bleeding  9/22 -- 1 unit RBC's for Hbg 7.9 after multiple bleeding episodes day before --GI was consulted, have signed off - on 08/21/24 around 3 am she had another bleed (per pt) mixed with the stool - she is worried about active bleed - agreeable for CT Angio - no Active bleed --Hold anticoagulation, as Vit K is low - oral Vit K has been sent to her home post DC to finish 5 days course. 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  --Initially on PO Vancomycin  --ID recommended Dificid  - total 10 days with fidoximicin 200mg  BID    Paroxysmal A-Fib s/p watchman device (not on chronic DOAC due to GIB) Acute on Chronic HFrEF (04/2024: LVEF 30%) Ischemic cardiomyopathy Severe Mitral Regurgitation Severe Tricuspid Regurgitation CAD s/p CABG (2005) - stable, no chest pain HLD HTN --Heart Failure team was following in ICU --Treated Lasix  80 mg IV bid which is now stopped --Off Plavix with bleeding  Pulmonary Hypertension T2DM Pancytopenia - f/by hemonc   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   CKD stage 3b Hx of CVA --Off Plavix with bleeding   OSA on CPAP   Hypothyroidism --Synthroid    IBS --Now with C diff - avoid anti-diarrheals   Former smoker (40 + pack year history)        Consultants: GI, PCCM,  ID, Advanced CHF Disposition: Home Diet recommendation:  Discharge Diet Orders (From admission, onward)     Start     Ordered   08/22/24 0000  Diet - low sodium heart healthy        08/22/24 0935           Carb modified diet DISCHARGE MEDICATION: Allergies as of 08/22/2024       Reactions   Vioxx [rofecoxib] Swelling        Medication List     PAUSE taking these medications    aspirin  EC 81 MG tablet Wait to take this until your doctor or other care provider tells you to start again. Take 81 mg by mouth daily. Swallow whole.   carvedilol  3.125 MG tablet Wait to take this until your doctor or other care provider tells you to start again. Commonly known as: COREG  Take 3.125 mg by mouth 2 (two) times daily with a meal.       STOP taking these medications    clopidogrel 75 MG tablet Commonly known as: PLAVIX   loperamide  2 MG tablet Commonly known as: Imodium  A-D       TAKE these medications    Accu-Chek Guide Me w/Device Kit   albuterol  108 (90 Base) MCG/ACT inhaler Commonly known as: VENTOLIN  HFA Inhale 2 puffs into the lungs every 6 (six) hours as needed for wheezing or shortness of breath.   allopurinol  300 MG tablet Commonly known as: ZYLOPRIM  Take 300 mg by mouth daily.   cyanocobalamin  1000 MCG tablet Commonly known as: VITAMIN B12 Take 1,000 mcg by mouth daily.   Dificid  200 MG Tabs tablet Generic drug: fidaxomicin  Take 1 tablet (200 mg total) by mouth 2 (two) times daily for 7 days.   empagliflozin  10 MG Tabs tablet Commonly known as: JARDIANCE  Take 1 tablet by mouth daily.   Ferrex 150 150 MG capsule Generic drug: iron  polysaccharides Take 1 capsule (150 mg total) by mouth daily. What changed:  how much to take when to take this additional instructions   fluticasone  50 MCG/ACT nasal spray Commonly known as: FLONASE  Place 2 sprays into the nose daily.   latanoprost  0.005 % ophthalmic solution Commonly known as:  XALATAN  Place 1 drop into both eyes at bedtime.   levothyroxine  50 MCG tablet Commonly known as: SYNTHROID  Take 50 mcg by mouth daily before breakfast.   magnesium  oxide 400 MG tablet Commonly known as: MAG-OX Take 400 mg by mouth. Take 1 tablet (400 mg total) by mouth once daily   metFORMIN  1000 MG tablet Commonly known as: GLUCOPHAGE  Take 1,000 mg by mouth 2 (two) times daily with a meal.   nadolol 20 MG tablet Commonly known as: CORGARD Take 20 mg by mouth daily.   pantoprazole  40 MG tablet Commonly known as: PROTONIX  Take 40 mg by mouth daily.   rosuvastatin  5 MG tablet Commonly known as: CRESTOR  Take 5 mg by mouth daily.   spironolactone  25 MG tablet Commonly known as: ALDACTONE  Take 0.5 tablets (12.5 mg total) by mouth daily.   torsemide  20 MG tablet Commonly known as: DEMADEX   Take 1 tablet (20 mg total) by mouth daily as needed (for weight gain or swelling).        Follow-up Information     [facility]. Go in 1 week(s).   Why: [facility] Heart Failure Clinic on 9/30 at 12:30 PM Contact information: [address] [place], [place] [address] [phone]        Valora Agent, MD. Schedule an appointment as soon as possible for a visit on 09/02/2024.   Specialty: Family Medicine Why: [facility] Discharge F/UP  Hopsital Follow-Up: Momday October 6th at @ 3:30 PM please bring your Photo ID and insurance card to the appointment Contact information: [address] [facility] [place] [address] [phone]         Melanee Annah BROCKS, MD. Schedule an appointment as soon as possible for a visit in 1 week(s).   Specialty: Oncology Why: [facility] Discharge F/UP  Please follow up after discharge Contact information: [address] [place] [address] [phone]                Discharge Exam: Filed Weights   08/20/24 0501 08/21/24 0520 08/22/24 0500  Weight: 58.2 kg 56.7 kg  56.4 kg   General exam: awake, alert, no acute distress HEENT: moist mucus membranes, hearing grossly normal  Respiratory system: on room air, normal respiratory effort. Cardiovascular system: normal S1/S2, RRR, no pedal edema.   Gastrointestinal system: soft, colostomy with liquid stool Central nervous system: A&O x 3. no gross focal neurologic deficits, normal speech Extremities: moves all, no edema, normal tone Skin: dry, intact, normal temperature Psychiatry: normal mood, congruent affect, judgement and insight appear normal  Condition at discharge: fair  The results of significant diagnostics from this hospitalization (including imaging, microbiology, ancillary and laboratory) are listed below for reference.   Imaging Studies: CT ANGIO GI BLEED Result Date: 08/21/2024 CLINICAL DATA:  Lower GI bleeding. EXAM: CTA ABDOMEN AND PELVIS WITHOUT AND WITH CONTRAST TECHNIQUE: Multidetector CT imaging of the abdomen and pelvis was performed using the standard protocol during bolus administration of intravenous contrast. Multiplanar reconstructed images and MIPs were obtained and reviewed to evaluate the vascular anatomy. RADIATION DOSE REDUCTION: This exam was performed according to the departmental dose-optimization program which includes automated exposure control, adjustment of the mA and/or kV according to patient size and/or use of iterative reconstruction technique. CONTRAST:  OMNIPAQUE  IOHEXOL  350 MG/ML SOLN COMPARISON:  CTA abdomen and pelvis 08/14/2024. FINDINGS: VASCULAR Aorta: Normal caliber aorta without aneurysm, dissection, vasculitis or significant stenosis. There is moderate calcified atherosclerotic disease throughout the aorta. Celiac: Patent without evidence of aneurysm, dissection, vasculitis or significant stenosis. SMA: Patent without evidence of aneurysm, dissection, vasculitis or significant stenosis. Renals: Both renal arteries are patent without evidence of aneurysm,  dissection, vasculitis, fibromuscular dysplasia or significant stenosis. There is mild stenosis of the origin of the renal artery secondary to calcified atherosclerotic disease. IMA: Small in caliber, but grossly patent. Inflow: Patent without evidence of aneurysm, dissection, vasculitis or significant stenosis. Proximal Outflow: Bilateral common femoral and visualized portions of the superficial and profunda femoral arteries are patent without evidence of aneurysm, dissection, vasculitis or significant stenosis. Veins: No obvious venous abnormality within the limitations of this arterial phase study. The portal veins, splenic vein and superior mesenteric vein appear patent. Review of the MIP images confirms the above findings. NON-VASCULAR Lower chest: There is a trace right pleural effusion. The heart is enlarged. Hepatobiliary: Gallstones are present. There is  no biliary ductal dilatation. Nodular liver contour is again seen. No focal liver lesion identified. Pancreas: Choose lung Spleen: Spleen is mildly enlarged, unchanged. Adrenals/Urinary Tract: Subcentimeter hypodensities are seen in both kidneys which are too small to characterize, likely cysts. There is no hydronephrosis or perinephric stranding. The adrenal glands and bladder are within normal limits. Stomach/Bowel: There are mildly dilated small bowel loops in the left abdomen with wall thickening and mesenteric edema. There is no pneumatosis. No active gastrointestinal bleeding identified. Right-sided ostomy is present. The stomach is within normal limits. There is no free air. Lymphatic: No enlarged lymph nodes are identified. Reproductive: Uterus and bilateral adnexa are unremarkable. Other: There is a small amount of ascites. There is diffuse body wall edema. There is a small upper abdominal ventral hernia containing ascites, unchanged. Musculoskeletal: Degenerative changes affect the spine. IMPRESSION: 1. No evidence for active gastrointestinal  bleeding. 2. Mildly dilated small bowel loops in the left abdomen with wall thickening and mesenteric edema. Findings may be related to enteritis or ischemia. No pneumatosis or free air. 3. Stable findings of cirrhosis and portal hypertension. 4. Small amount of ascites. 5. Diffuse body wall edema. 6. Trace right pleural effusion. 7. Cholelithiasis. 8. Aortic atherosclerosis. Aortic Atherosclerosis (ICD10-I70.0). Electronically Signed   By: Greig Pique M.D.   On: 08/21/2024 16:45   NM GI Blood Loss Result Date: 08/16/2024 CLINICAL DATA:  Bright red blood per rectum, anemia EXAM: NUCLEAR MEDICINE GASTROINTESTINAL BLEEDING SCAN TECHNIQUE: Sequential abdominal images were obtained following intravenous administration of Tc-50m labeled red blood cells. RADIOPHARMACEUTICALS:  26.49 mCi Tc-33m pertechnetate in-vitro labeled red cells. COMPARISON:  08/14/2024 FINDINGS: Sequential anterior planar images of the abdomen and pelvis are obtained for 2 hours after radiotracer administration. There is normal physiologic radiotracer distribution within the heart, liver, spleen, kidneys, and vascular structures. There is no abnormal radiotracer activity to suggest active gastrointestinal bleeding. IMPRESSION: 1. No evidence of active gastrointestinal bleeding. Electronically Signed   By: Ozell Daring M.D.   On: 08/16/2024 18:30   ECHOCARDIOGRAM COMPLETE Result Date: 08/16/2024    ECHOCARDIOGRAM REPORT   Patient Name:   MASHANDA ISHIBASHI [facility] Date of Exam: 08/16/2024 Medical Rec #:  [id number]       Height:       61.0 in Accession #:    [id number]      Weight:       137.6 lb Date of Birth:  03/11/1956       BSA:          1.611 m Patient Age:    68 years        BP:           97/58 mmHg Patient Gender: F               HR:           62 bpm. Exam Location:  [facility] Procedure: 2D Echo, Cardiac Doppler and Color Doppler (Both Spectral and Color            Flow Doppler were utilized during procedure). Indications:     Congestive Heart  Failure I50.9  History:         Patient has prior history of Echocardiogram examinations, most                  recent 03/07/2024. CHF, Watchman Device; Mitral Valve Disease                  and MitraClip.  Sonographer:  Ashley McNeely-Sloane Referring Phys:  [id number] DANA G NELSON Diagnosing Phys: Ezra McleanMD IMPRESSIONS  1. Left ventricular ejection fraction, by estimation, is 35 to 40%. The left ventricle has moderately decreased function. The left ventricle demonstrates global hypokinesis. Left ventricular diastolic parameters are indeterminate.  2. Mildly D-shaped interventricular septum suggestive of RV pressure/volume overload. Peak RV-RA gradient 36 mmHg. Right ventricular systolic function is moderately reduced. The right ventricular size is severely enlarged.  3. Left atrial size was severely dilated.  4. Right atrial size was severely dilated.  5. 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 Mean gradient 5 mmHg. Moderate residual mitral regurgitation.  6. The tricuspid valve is abnormal. Tricuspid valve regurgitation is severe.  7. The aortic valve is tricuspid. There is mild calcification of the aortic valve. Aortic valve regurgitation is not visualized. No aortic stenosis is present.  8. The IVC was not visualized.  9. The patient was in atrial fibrillation. FINDINGS  Left Ventricle: Left ventricular ejection fraction, by estimation, is 35 to 40%. The left ventricle has moderately decreased function. The left ventricle demonstrates global hypokinesis. The left ventricular internal cavity size was normal in size. There is no left ventricular hypertrophy. Left ventricular diastolic parameters are indeterminate. Right Ventricle: Mildly D-shaped interventricular septum suggestive of RV pressure/volume overload. Peak RV-RA gradient 36 mmHg. The right ventricular size is severely enlarged. No increase in right ventricular wall thickness. Right ventricular systolic function  is moderately reduced. Left Atrium: Left atrial size was severely dilated. Right Atrium: Right atrial size was severely dilated. Pericardium: There is no evidence of pericardial effusion. Mitral Valve: S/p mitral valve repair via mTEER with 1 Mitraclip, position appears A2/P2. Mean gradient 5 mmHg. Moderate residual mitral regurgitation. MV peak gradient, 16.0 mmHg. The mean mitral valve gradient is 5.0 mmHg. Tricuspid Valve: The tricuspid valve is abnormal. Tricuspid valve regurgitation is severe. Aortic Valve: The aortic valve is tricuspid. There is mild calcification of the aortic valve. Aortic valve regurgitation is not visualized. No aortic stenosis is present. Aortic valve mean gradient measures 4.0 mmHg. Aortic valve peak gradient measures 6.9 mmHg. Aortic valve area, by VTI measures 1.51 cm. Pulmonic Valve: The pulmonic valve was abnormal. Pulmonic valve regurgitation is mild to moderate. Aorta: The aortic root is normal in size and structure. Venous: The inferior vena cava was not well visualized. IAS/Shunts: No atrial level shunt detected by color flow Doppler.  LEFT VENTRICLE PLAX 2D LVIDd:         4.40 cm     Diastology LVIDs:         3.90 cm     LV e' medial:    5.18 cm/s LV PW:         0.90 cm     LV E/e' medial:  35.7 LV IVS:        0.90 cm     LV e' lateral:   10.90 cm/s LVOT diam:     1.90 cm     LV E/e' lateral: 17.0 LV SV:         43 LV SV Index:   27 LVOT Area:     2.84 cm  LV Volumes (MOD) LV vol d, MOD A2C: 74.4 ml LV vol d, MOD A4C: 65.6 ml LV vol s, MOD A2C: 30.6 ml LV vol s, MOD A4C: 32.6 ml LV SV MOD A2C:     43.8 ml LV SV MOD A4C:     65.6 ml LV SV MOD BP:  37.1 ml RIGHT VENTRICLE RV S prime:     5.00 cm/s TAPSE (M-mode): 1.0 cm LEFT ATRIUM             Index        RIGHT ATRIUM           Index LA diam:        4.10 cm 2.54 cm/m   RA Area:     26.50 cm LA Vol (A2C):   81.9 ml 50.84 ml/m  RA Volume:   98.40 ml  61.08 ml/m LA Vol (A4C):   73.3 ml 45.50 ml/m LA Biplane Vol: 83.0 ml  51.52 ml/m  AORTIC VALVE                    PULMONIC VALVE AV Area (Vmax):    1.61 cm     PV Vmax:          0.89 m/s AV Area (Vmean):   1.54 cm     PV Vmean:         63.300 cm/s AV Area (VTI):     1.51 cm     PV VTI:           0.218 m AV Vmax:           131.00 cm/s  PV Peak grad:     3.2 mmHg AV Vmean:          89.800 cm/s  PV Mean grad:     2.0 mmHg AV VTI:            0.283 m      PR End Diast Vel: 6.15 msec AV Peak Grad:      6.9 mmHg     RVOT Peak grad:   2 mmHg AV Mean Grad:      4.0 mmHg LVOT Vmax:         74.40 cm/s LVOT Vmean:        48.700 cm/s LVOT VTI:          0.151 m LVOT/AV VTI ratio: 0.53  AORTA Ao Root diam: 2.70 cm Ao Asc diam:  2.10 cm MITRAL VALVE                TRICUSPID VALVE MV Area (PHT): 1.87 cm     TR Peak grad:   35.5 mmHg MV Area VTI:   0.91 cm     TR Mean grad:   23.0 mmHg MV Peak grad:  16.0 mmHg    TR Vmax:        298.00 cm/s MV Mean grad:  5.0 mmHg     TR Vmean:       229.0 cm/s MV Vmax:       2.00 m/s MV Vmean:      93.2 cm/s    SHUNTS MV Decel Time: 406 msec     Systemic VTI:  0.15 m MR Peak grad: 102.4 mmHg    Systemic Diam: 1.90 cm MR Mean grad: 63.0 mmHg     Pulmonic VTI:  0.170 m MR Vmax:      506.00 cm/s MR Vmean:     369.0 cm/s MV E velocity: 185.00 cm/s MV A velocity: 44.70 cm/s MV E/A ratio:  4.14 Dalton McleanMD Electronically signed by Ezra Kanner Signature Date/Time: 08/16/2024/11:31:59 AM    Final    DG Chest Port 1 View Result Date: 08/15/2024 CLINICAL DATA:  Status post PICC placement. EXAM: PORTABLE CHEST 1 VIEW COMPARISON:  February 28, 2024.  August 14, 2024. FINDINGS: Stable cardiomegaly. Sternotomy wires are noted. Endotracheal and nasogastric tube have been removed. Lungs are clear. There has been interval placement of left-sided PICC line. However, the distal tip appears to be to the left of the lower thoracic spine. The more proximal portion of the catheter appears to be lateral to the contours of the descending thoracic aorta, making intra-aortic  location less likely. Upon review of yesterday's CTA, the patient may have a left-sided SVC which enters inferior portion of right atrium. Potentially the distal tip of the PICC line is within this. IMPRESSION: Interval placement of left-sided PICC line. However, the distal tip appears to be to the left of the lower thoracic spine. The more proximal portion of the catheter appears to be lateral to the contours of the descending thoracic aorta, making intra-aortic location less likely. Upon review of yesterday's CTA, the patient may have a left-sided SVC which enters inferior portion of right atrium. Potentially the distal tip of the PICC line is within this. CT scan is recommended for further evaluation. Electronically Signed   By: Lynwood Landy Raddle M.D.   On: 08/15/2024 17:04   US  EKG SITE RITE Result Date: 08/15/2024 If Site Rite image not attached, placement could not be confirmed due to current cardiac rhythm.  US  EKG SITE RITE Result Date: 08/15/2024 If Site Rite image not attached, placement could not be confirmed due to current cardiac rhythm.  CT Angio Abd/Pel W and/or Wo Contrast Result Date: 08/14/2024 CLINICAL DATA:  Lower GI bleed. EXAM: CTA ABDOMEN AND PELVIS WITHOUT AND WITH CONTRAST TECHNIQUE: Multidetector CT imaging of the abdomen and pelvis was performed using the standard protocol during bolus administration of intravenous contrast. Multiplanar reconstructed images and MIPs were obtained and reviewed to evaluate the vascular anatomy. RADIATION DOSE REDUCTION: This exam was performed according to the departmental dose-optimization program which includes automated exposure control, adjustment of the mA and/or kV according to patient size and/or use of iterative reconstruction technique. CONTRAST:  80mL OMNIPAQUE  IOHEXOL  350 MG/ML SOLN COMPARISON:  CT dated 07/19/2024. FINDINGS: VASCULAR Aorta: Moderate atherosclerotic calcification of the thoracic aorta. No adrenal dilatation or dissection.  No periaortic fluid collection. Celiac: Atherosclerotic calcification of the origin of the celiac trunk. The celiac artery is patent. SMA: Atherosclerotic calcification of the origin of the SMA. The SMA is patent. Renals: Atherosclerotic calcification of the renal artery ostia. There is luminal narrowing with diminished flow in the renal arteries. IMA: The origin of the IMA is not visualized. There is reconstitution of flow within the IMA. Inflow: Mild atherosclerotic calcification of the iliac arteries. No aneurysmal dilatation or dissection. The iliac arteries are patent. Proximal Outflow: The visualized proximal of fluid patent. Veins: The IVC is unremarkable. The SMV, splenic vein, and main portal vein are patent. No portal venous gas. Review of the MIP images confirms the above findings. NON-VASCULAR Lower chest: Trace right pleural effusion. The visualized lung bases are clear. Mild cardiomegaly. No intra-abdominal free air.  Small ascites. Hepatobiliary: Cirrhosis. Multiple gallstones. The gallbladder is contracted. Pancreas: Unremarkable. No pancreatic ductal dilatation or surrounding inflammatory changes. Spleen: Normal in size without focal abnormality. Adrenals/Urinary Tract: The adrenal glands unremarkable. There is no hydronephrosis or nephrolithiasis on either side. The visualized ureters and urinary bladder appear unremarkable. 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 Small parastomal hernia. No evidence of bowel obstruction. Residual oral contrast noted in the stomach. There is diffuse enhancement of the small bowel mucosa. No evidence of  active GI bleed. Appendectomy. Lymphatic: No adenopathy. Reproductive: No suspicious pelvic mass. Other: Midline vertical anterior pelvic wall incisional scar and subcutaneous edema. No fluid collection. Musculoskeletal: Degenerative changes of the spine and scoliosis. No acute osseous pathology. IMPRESSION: 1. No  evidence of active GI bleed. 2. 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 No evidence of bowel obstruction. 3. Cirrhosis with small ascites. 4. Cholelithiasis. 5.  Aortic Atherosclerosis (ICD10-I70.0). Electronically Signed   By: Vanetta Chou M.D.   On: 08/14/2024 16:25   US  Venous Img Lower Bilateral (DVT) Result Date: 08/05/2024 CLINICAL DATA:  Lower extremity edema for 2 weeks EXAM: BILATERAL LOWER EXTREMITY VENOUS DOPPLER ULTRASOUND TECHNIQUE: Gray-scale sonography with graded compression, as well as color Doppler and duplex ultrasound were performed to evaluate the lower extremity deep venous systems from the level of the common femoral vein and including the common femoral, femoral, profunda femoral, popliteal and calf veins including the posterior tibial, peroneal and gastrocnemius veins when visible. The superficial great saphenous vein was also interrogated. Spectral Doppler was utilized to evaluate flow at rest and with distal augmentation maneuvers in the common femoral, femoral and popliteal veins. COMPARISON:  None Available. FINDINGS: RIGHT LOWER EXTREMITY Common Femoral Vein: No evidence of thrombus. Normal compressibility, respiratory phasicity and response to augmentation. Saphenofemoral Junction: No evidence of thrombus. Normal compressibility and flow on color Doppler imaging. Profunda Femoral Vein: No evidence of thrombus. Normal compressibility and flow on color Doppler imaging. Femoral Vein: No evidence of thrombus. Normal compressibility, respiratory phasicity and response to augmentation. Popliteal Vein: No evidence of thrombus. Normal compressibility, respiratory phasicity and response to augmentation. Calf Veins: No evidence of thrombus. Normal compressibility and flow on color Doppler imaging. Superficial Great Saphenous Vein: No evidence of thrombus. Normal compressibility. Venous Reflux:  None. Other Findings:  None. LEFT LOWER EXTREMITY Common  Femoral Vein: No evidence of thrombus. Normal compressibility, respiratory phasicity and response to augmentation. Saphenofemoral Junction: No evidence of thrombus. Normal compressibility and flow on color Doppler imaging. Profunda Femoral Vein: No evidence of thrombus. Normal compressibility and flow on color Doppler imaging. Femoral Vein: No evidence of thrombus. Normal compressibility, respiratory phasicity and response to augmentation. Popliteal Vein: No evidence of thrombus. Normal compressibility, respiratory phasicity and response to augmentation. Calf Veins: No evidence of thrombus. Normal compressibility and flow on color Doppler imaging. Superficial Great Saphenous Vein: No evidence of thrombus. Normal compressibility. Venous Reflux:  None. Other Findings:  None. IMPRESSION: No evidence of deep venous thrombosis in either lower extremity. Electronically Signed   By: Wilkie Lent M.D.   On: 08/05/2024 16:47    Microbiology: Results for orders placed or performed during the hospital encounter of 08/14/24  MRSA Next Gen by PCR, Nasal     Status: Abnormal   Collection Time: 08/14/24  8:08 PM   Specimen: Nasal Mucosa; Nasal Swab  Result Value Ref Range Status   MRSA by PCR Next Gen DETECTED (A) NOT DETECTED Final    Comment: RESULT CALLED TO, READ BACK BY AND VERIFIED WITH:  HOLLY CHANDLER AT 2313 08/14/24 JG (NOTE) The GeneXpert MRSA Assay (FDA approved for NASAL specimens only), is one component of a comprehensive MRSA colonization surveillance program. It is not intended to diagnose MRSA infection nor to guide or monitor treatment for MRSA infections. Test performance is not FDA approved in patients less than 74 years old. Performed at [facility], [address], [place], [place] [address]   C Difficile Quick Screen w PCR reflex  Status: Abnormal   Collection Time: 08/15/24  9:29 AM   Specimen: STOOL  Result Value Ref Range Status   C Diff antigen POSITIVE (A)  NEGATIVE Final   C Diff toxin POSITIVE (A) NEGATIVE Final   C Diff interpretation Toxin producing C. difficile detected.  Final    Comment: CRITICAL RESULT CALLED TO, READ BACK BY AND VERIFIED WITH: CHRIS MCCOLLUM RN 1027 08/15/24 HNM Performed at [facility], [address] [place], [place] [address]     Labs: CBC: Recent Labs  Lab 08/18/24 [phone] 08/18/24 1610 08/19/24 0603 08/19/24 1540 08/20/24 0337 08/21/24 0534 08/22/24 0558  WBC 4.7  --  5.4  --  3.5* 2.5* 2.1*  HGB 9.2*   < > 8.5* 7.9* 9.0* 9.2* 8.1*  HCT 27.2*   < > 24.4* 23.1* 26.8* 26.9* 24.8*  MCV 93.2  --  92.8  --  93.7 93.4 95.8  PLT 66*  --  78*  --  66* 58* 62*   < > = values in this interval not displayed.   Basic Metabolic Panel: Recent Labs  Lab 08/17/24 0303 08/18/24 0514 08/19/24 0603 08/20/24 0337 08/21/24 0534 08/22/24 0550  NA 134* 131* 135 137 138 139  K 3.3* 3.7 4.0 3.4* 3.3* 3.6  CL 101 100 101 103 104 105  CO2 23 22 24 25 26 25   GLUCOSE 231* 141* 182* 119* 122* 140*  BUN 26* 31* 35* 31* 29* 23  CREATININE 1.02* 1.13* 1.10* 0.95 1.00 0.92  CALCIUM  7.7* 7.8* 8.0* 7.8* 8.0* 8.1*  MG 1.9  --   --   --   --   --   PHOS 3.9 3.2 2.1* 2.3* 3.1  --    Liver Function Tests: Recent Labs  Lab 08/17/24 0303 08/18/24 0514 08/19/24 0603 08/20/24 0337 08/21/24 0534  ALBUMIN  3.2* 3.4* 3.2* 3.2* 3.0*   CBG: Recent Labs  Lab 08/21/24 0744 08/21/24 1129 08/21/24 1703 08/21/24 2045 08/22/24 0901  GLUCAP 121* 219* 246* 263* 139*    Discharge time spent: greater than 30 minutes.  Signed: Cresencio Fairly, MD [facility] 08/23/2024

## 2024-08-26 ENCOUNTER — Encounter: Payer: Self-pay | Admitting: Dietician

## 2024-08-26 NOTE — Progress Notes (Signed)
 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   Pt had vitamins labs sent out while hospitalized r/t ongoing bleeding and pt at risk for deficiency secondary to cirrhosis. Vitamin labs returned as follows. Spoke with patient via the phone, notified her of vitamin lab results and recommendations for supplementation. Pt reports that she was prescribed vitamin K supplementation for 6 days. Pt did receive vitamin C  supplementation in hospital but will need to continue for 30 days.   Recommendations:  -Continue vitamin K supplementation as prescribed by MD -Vitamin C  500mg  po BID x 30 days  Pt will need vitamin labs rechecked in 30-60 days after supplementation complete. Pt aware of recommendations.   Augustin Shams MS, RD, LDN If unable to be reached, please send secure chat to RD inpatient available from 8:00a-4:00p daily

## 2024-08-28 ENCOUNTER — Inpatient Hospital Stay: Attending: Oncology

## 2024-08-28 VITALS — BP 95/55 | HR 54 | Temp 97.8°F | Resp 16

## 2024-08-28 DIAGNOSIS — D509 Iron deficiency anemia, unspecified: Secondary | ICD-10-CM | POA: Insufficient documentation

## 2024-08-28 DIAGNOSIS — D500 Iron deficiency anemia secondary to blood loss (chronic): Secondary | ICD-10-CM

## 2024-08-28 MED ORDER — SODIUM CHLORIDE 0.9 % IV SOLN
510.0000 mg | Freq: Once | INTRAVENOUS | Status: AC
  Administered 2024-08-28: 510 mg via INTRAVENOUS
  Filled 2024-08-28: qty 510

## 2024-08-28 NOTE — Progress Notes (Signed)
 Declined post observation. Aware of risks. Stable at discharge.

## 2024-08-28 NOTE — Patient Instructions (Signed)

## 2024-09-04 ENCOUNTER — Inpatient Hospital Stay

## 2024-09-04 VITALS — BP 98/65 | HR 55 | Temp 97.2°F | Resp 18

## 2024-09-04 DIAGNOSIS — D509 Iron deficiency anemia, unspecified: Secondary | ICD-10-CM | POA: Diagnosis not present

## 2024-09-04 DIAGNOSIS — D500 Iron deficiency anemia secondary to blood loss (chronic): Secondary | ICD-10-CM

## 2024-09-04 LAB — CBC WITH DIFFERENTIAL (CANCER CENTER ONLY)
Abs Immature Granulocytes: 0.01 K/uL (ref 0.00–0.07)
Basophils Absolute: 0 K/uL (ref 0.0–0.1)
Basophils Relative: 1 %
Eosinophils Absolute: 0.3 K/uL (ref 0.0–0.5)
Eosinophils Relative: 12 %
HCT: 28.2 % — ABNORMAL LOW (ref 36.0–46.0)
Hemoglobin: 9 g/dL — ABNORMAL LOW (ref 12.0–15.0)
Immature Granulocytes: 0 %
Lymphocytes Relative: 12 %
Lymphs Abs: 0.3 K/uL — ABNORMAL LOW (ref 0.7–4.0)
MCH: 32.8 pg (ref 26.0–34.0)
MCHC: 31.9 g/dL (ref 30.0–36.0)
MCV: 102.9 fL — ABNORMAL HIGH (ref 80.0–100.0)
Monocytes Absolute: 0.2 K/uL (ref 0.1–1.0)
Monocytes Relative: 8 %
Neutro Abs: 1.7 K/uL (ref 1.7–7.7)
Neutrophils Relative %: 67 %
Platelet Count: 77 K/uL — ABNORMAL LOW (ref 150–400)
RBC: 2.74 MIL/uL — ABNORMAL LOW (ref 3.87–5.11)
RDW: 20.8 % — ABNORMAL HIGH (ref 11.5–15.5)
WBC Count: 2.6 K/uL — ABNORMAL LOW (ref 4.0–10.5)
nRBC: 0 % (ref 0.0–0.2)

## 2024-09-04 LAB — FERRITIN: Ferritin: 712 ng/mL — ABNORMAL HIGH (ref 11–307)

## 2024-09-04 LAB — IRON AND TIBC
Iron: 147 ug/dL (ref 28–170)
Saturation Ratios: 36 % — ABNORMAL HIGH (ref 10.4–31.8)
TIBC: 405 ug/dL (ref 250–450)
UIBC: 258 ug/dL

## 2024-09-04 MED ORDER — SODIUM CHLORIDE 0.9 % IV SOLN
510.0000 mg | Freq: Once | INTRAVENOUS | Status: AC
  Administered 2024-09-04: 510 mg via INTRAVENOUS
  Filled 2024-09-04: qty 510

## 2024-09-04 NOTE — Patient Instructions (Signed)

## 2024-09-18 ENCOUNTER — Inpatient Hospital Stay

## 2024-09-18 DIAGNOSIS — D509 Iron deficiency anemia, unspecified: Secondary | ICD-10-CM | POA: Diagnosis not present

## 2024-09-18 DIAGNOSIS — D500 Iron deficiency anemia secondary to blood loss (chronic): Secondary | ICD-10-CM

## 2024-09-18 LAB — CBC (CANCER CENTER ONLY)
HCT: 28.8 % — ABNORMAL LOW (ref 36.0–46.0)
Hemoglobin: 9.4 g/dL — ABNORMAL LOW (ref 12.0–15.0)
MCH: 34.6 pg — ABNORMAL HIGH (ref 26.0–34.0)
MCHC: 32.6 g/dL (ref 30.0–36.0)
MCV: 105.9 fL — ABNORMAL HIGH (ref 80.0–100.0)
Platelet Count: 70 K/uL — ABNORMAL LOW (ref 150–400)
RBC: 2.72 MIL/uL — ABNORMAL LOW (ref 3.87–5.11)
RDW: 20.4 % — ABNORMAL HIGH (ref 11.5–15.5)
WBC Count: 2.9 K/uL — ABNORMAL LOW (ref 4.0–10.5)
nRBC: 0 % (ref 0.0–0.2)

## 2024-09-27 ENCOUNTER — Encounter: Payer: Self-pay | Admitting: Oncology

## 2024-10-27 ENCOUNTER — Inpatient Hospital Stay
Admission: EM | Admit: 2024-10-27 | Discharge: 2024-11-18 | DRG: 393 | Disposition: A | Discharge status: Home Health | Attending: Internal Medicine | Admitting: Internal Medicine

## 2024-10-27 ENCOUNTER — Inpatient Hospital Stay

## 2024-10-27 ENCOUNTER — Other Ambulatory Visit: Payer: Self-pay

## 2024-10-27 ENCOUNTER — Emergency Department

## 2024-10-27 ENCOUNTER — Encounter: Payer: Self-pay | Admitting: Emergency Medicine

## 2024-10-27 DIAGNOSIS — Z833 Family history of diabetes mellitus: Secondary | ICD-10-CM

## 2024-10-27 DIAGNOSIS — Z961 Presence of intraocular lens: Secondary | ICD-10-CM | POA: Diagnosis present

## 2024-10-27 DIAGNOSIS — R579 Shock, unspecified: Secondary | ICD-10-CM | POA: Diagnosis not present

## 2024-10-27 DIAGNOSIS — I5023 Acute on chronic systolic (congestive) heart failure: Secondary | ICD-10-CM | POA: Diagnosis not present

## 2024-10-27 DIAGNOSIS — Z794 Long term (current) use of insulin: Secondary | ICD-10-CM

## 2024-10-27 DIAGNOSIS — K766 Portal hypertension: Secondary | ICD-10-CM | POA: Diagnosis present

## 2024-10-27 DIAGNOSIS — Z66 Do not resuscitate: Secondary | ICD-10-CM | POA: Diagnosis present

## 2024-10-27 DIAGNOSIS — I130 Hypertensive heart and chronic kidney disease with heart failure and stage 1 through stage 4 chronic kidney disease, or unspecified chronic kidney disease: Secondary | ICD-10-CM | POA: Diagnosis present

## 2024-10-27 DIAGNOSIS — Z79899 Other long term (current) drug therapy: Secondary | ICD-10-CM

## 2024-10-27 DIAGNOSIS — D721 Eosinophilia, unspecified: Secondary | ICD-10-CM | POA: Diagnosis present

## 2024-10-27 DIAGNOSIS — I480 Paroxysmal atrial fibrillation: Secondary | ICD-10-CM | POA: Diagnosis present

## 2024-10-27 DIAGNOSIS — I272 Pulmonary hypertension, unspecified: Secondary | ICD-10-CM | POA: Diagnosis present

## 2024-10-27 DIAGNOSIS — Z803 Family history of malignant neoplasm of breast: Secondary | ICD-10-CM

## 2024-10-27 DIAGNOSIS — Z7982 Long term (current) use of aspirin: Secondary | ICD-10-CM

## 2024-10-27 DIAGNOSIS — Z8673 Personal history of transient ischemic attack (TIA), and cerebral infarction without residual deficits: Secondary | ICD-10-CM

## 2024-10-27 DIAGNOSIS — D62 Acute posthemorrhagic anemia: Secondary | ICD-10-CM | POA: Diagnosis not present

## 2024-10-27 DIAGNOSIS — R7401 Elevation of levels of liver transaminase levels: Secondary | ICD-10-CM | POA: Diagnosis not present

## 2024-10-27 DIAGNOSIS — Z1152 Encounter for screening for COVID-19: Secondary | ICD-10-CM

## 2024-10-27 DIAGNOSIS — A0471 Enterocolitis due to Clostridium difficile, recurrent: Secondary | ICD-10-CM | POA: Diagnosis present

## 2024-10-27 DIAGNOSIS — N170 Acute kidney failure with tubular necrosis: Secondary | ICD-10-CM | POA: Diagnosis present

## 2024-10-27 DIAGNOSIS — E785 Hyperlipidemia, unspecified: Secondary | ICD-10-CM | POA: Diagnosis present

## 2024-10-27 DIAGNOSIS — R188 Other ascites: Secondary | ICD-10-CM | POA: Diagnosis present

## 2024-10-27 DIAGNOSIS — R578 Other shock: Secondary | ICD-10-CM | POA: Diagnosis present

## 2024-10-27 DIAGNOSIS — Z515 Encounter for palliative care: Secondary | ICD-10-CM | POA: Diagnosis not present

## 2024-10-27 DIAGNOSIS — I5043 Acute on chronic combined systolic (congestive) and diastolic (congestive) heart failure: Secondary | ICD-10-CM | POA: Diagnosis present

## 2024-10-27 DIAGNOSIS — I4891 Unspecified atrial fibrillation: Secondary | ICD-10-CM | POA: Diagnosis not present

## 2024-10-27 DIAGNOSIS — K922 Gastrointestinal hemorrhage, unspecified: Secondary | ICD-10-CM | POA: Diagnosis not present

## 2024-10-27 DIAGNOSIS — T380X5A Adverse effect of glucocorticoids and synthetic analogues, initial encounter: Secondary | ICD-10-CM | POA: Diagnosis present

## 2024-10-27 DIAGNOSIS — Z7984 Long term (current) use of oral hypoglycemic drugs: Secondary | ICD-10-CM

## 2024-10-27 DIAGNOSIS — K5730 Diverticulosis of large intestine without perforation or abscess without bleeding: Secondary | ICD-10-CM | POA: Diagnosis present

## 2024-10-27 DIAGNOSIS — M810 Age-related osteoporosis without current pathological fracture: Secondary | ICD-10-CM | POA: Diagnosis present

## 2024-10-27 DIAGNOSIS — I8511 Secondary esophageal varices with bleeding: Secondary | ICD-10-CM | POA: Diagnosis present

## 2024-10-27 DIAGNOSIS — I5022 Chronic systolic (congestive) heart failure: Secondary | ICD-10-CM | POA: Diagnosis not present

## 2024-10-27 DIAGNOSIS — I482 Chronic atrial fibrillation, unspecified: Secondary | ICD-10-CM | POA: Diagnosis present

## 2024-10-27 DIAGNOSIS — E278 Other specified disorders of adrenal gland: Secondary | ICD-10-CM | POA: Diagnosis present

## 2024-10-27 DIAGNOSIS — D684 Acquired coagulation factor deficiency: Secondary | ICD-10-CM | POA: Diagnosis present

## 2024-10-27 DIAGNOSIS — E440 Moderate protein-calorie malnutrition: Secondary | ICD-10-CM | POA: Diagnosis present

## 2024-10-27 DIAGNOSIS — N1832 Chronic kidney disease, stage 3b: Secondary | ICD-10-CM | POA: Diagnosis present

## 2024-10-27 DIAGNOSIS — E46 Unspecified protein-calorie malnutrition: Secondary | ICD-10-CM | POA: Diagnosis present

## 2024-10-27 DIAGNOSIS — Z8249 Family history of ischemic heart disease and other diseases of the circulatory system: Secondary | ICD-10-CM

## 2024-10-27 DIAGNOSIS — Z932 Ileostomy status: Secondary | ICD-10-CM

## 2024-10-27 DIAGNOSIS — E875 Hyperkalemia: Secondary | ICD-10-CM | POA: Diagnosis present

## 2024-10-27 DIAGNOSIS — Z9049 Acquired absence of other specified parts of digestive tract: Secondary | ICD-10-CM

## 2024-10-27 DIAGNOSIS — Z7189 Other specified counseling: Secondary | ICD-10-CM | POA: Diagnosis not present

## 2024-10-27 DIAGNOSIS — Z6827 Body mass index (BMI) 27.0-27.9, adult: Secondary | ICD-10-CM

## 2024-10-27 DIAGNOSIS — R570 Cardiogenic shock: Secondary | ICD-10-CM | POA: Diagnosis present

## 2024-10-27 DIAGNOSIS — Z9842 Cataract extraction status, left eye: Secondary | ICD-10-CM

## 2024-10-27 DIAGNOSIS — E876 Hypokalemia: Secondary | ICD-10-CM | POA: Diagnosis present

## 2024-10-27 DIAGNOSIS — E1122 Type 2 diabetes mellitus with diabetic chronic kidney disease: Secondary | ICD-10-CM | POA: Diagnosis present

## 2024-10-27 DIAGNOSIS — I5082 Biventricular heart failure: Secondary | ICD-10-CM | POA: Diagnosis present

## 2024-10-27 DIAGNOSIS — D61818 Other pancytopenia: Secondary | ICD-10-CM | POA: Diagnosis present

## 2024-10-27 DIAGNOSIS — I082 Rheumatic disorders of both aortic and tricuspid valves: Secondary | ICD-10-CM | POA: Diagnosis present

## 2024-10-27 DIAGNOSIS — Z96651 Presence of right artificial knee joint: Secondary | ICD-10-CM | POA: Diagnosis present

## 2024-10-27 DIAGNOSIS — K9411 Enterostomy hemorrhage: Secondary | ICD-10-CM | POA: Diagnosis present

## 2024-10-27 DIAGNOSIS — I25810 Atherosclerosis of coronary artery bypass graft(s) without angina pectoris: Secondary | ICD-10-CM | POA: Diagnosis present

## 2024-10-27 DIAGNOSIS — I2510 Atherosclerotic heart disease of native coronary artery without angina pectoris: Secondary | ICD-10-CM | POA: Diagnosis present

## 2024-10-27 DIAGNOSIS — M109 Gout, unspecified: Secondary | ICD-10-CM | POA: Diagnosis present

## 2024-10-27 DIAGNOSIS — Z951 Presence of aortocoronary bypass graft: Secondary | ICD-10-CM

## 2024-10-27 DIAGNOSIS — F32A Depression, unspecified: Secondary | ICD-10-CM | POA: Diagnosis present

## 2024-10-27 DIAGNOSIS — K9401 Colostomy hemorrhage: Secondary | ICD-10-CM | POA: Diagnosis present

## 2024-10-27 DIAGNOSIS — G4733 Obstructive sleep apnea (adult) (pediatric): Secondary | ICD-10-CM | POA: Diagnosis not present

## 2024-10-27 DIAGNOSIS — E860 Dehydration: Secondary | ICD-10-CM | POA: Diagnosis present

## 2024-10-27 DIAGNOSIS — Z95818 Presence of other cardiac implants and grafts: Secondary | ICD-10-CM

## 2024-10-27 DIAGNOSIS — Z59868 Other specified financial insecurity: Secondary | ICD-10-CM

## 2024-10-27 DIAGNOSIS — E039 Hypothyroidism, unspecified: Secondary | ICD-10-CM | POA: Diagnosis present

## 2024-10-27 DIAGNOSIS — Z87891 Personal history of nicotine dependence: Secondary | ICD-10-CM

## 2024-10-27 DIAGNOSIS — I255 Ischemic cardiomyopathy: Secondary | ICD-10-CM | POA: Diagnosis present

## 2024-10-27 DIAGNOSIS — M419 Scoliosis, unspecified: Secondary | ICD-10-CM | POA: Diagnosis present

## 2024-10-27 DIAGNOSIS — Z7901 Long term (current) use of anticoagulants: Secondary | ICD-10-CM

## 2024-10-27 DIAGNOSIS — R600 Localized edema: Secondary | ICD-10-CM | POA: Diagnosis not present

## 2024-10-27 DIAGNOSIS — I8500 Esophageal varices without bleeding: Secondary | ICD-10-CM | POA: Diagnosis present

## 2024-10-27 DIAGNOSIS — E1165 Type 2 diabetes mellitus with hyperglycemia: Secondary | ICD-10-CM | POA: Diagnosis present

## 2024-10-27 DIAGNOSIS — D649 Anemia, unspecified: Secondary | ICD-10-CM | POA: Diagnosis not present

## 2024-10-27 DIAGNOSIS — R571 Hypovolemic shock: Secondary | ICD-10-CM | POA: Diagnosis not present

## 2024-10-27 DIAGNOSIS — E8721 Acute metabolic acidosis: Secondary | ICD-10-CM | POA: Diagnosis present

## 2024-10-27 DIAGNOSIS — Z79890 Hormone replacement therapy (postmenopausal): Secondary | ICD-10-CM

## 2024-10-27 DIAGNOSIS — E11649 Type 2 diabetes mellitus with hypoglycemia without coma: Secondary | ICD-10-CM | POA: Diagnosis present

## 2024-10-27 DIAGNOSIS — E871 Hypo-osmolality and hyponatremia: Secondary | ICD-10-CM | POA: Diagnosis present

## 2024-10-27 DIAGNOSIS — K7460 Unspecified cirrhosis of liver: Secondary | ICD-10-CM | POA: Diagnosis present

## 2024-10-27 DIAGNOSIS — Z9841 Cataract extraction status, right eye: Secondary | ICD-10-CM

## 2024-10-27 DIAGNOSIS — Z91199 Patient's noncompliance with other medical treatment and regimen due to unspecified reason: Secondary | ICD-10-CM

## 2024-10-27 DIAGNOSIS — N179 Acute kidney failure, unspecified: Secondary | ICD-10-CM | POA: Diagnosis present

## 2024-10-27 DIAGNOSIS — E119 Type 2 diabetes mellitus without complications: Secondary | ICD-10-CM

## 2024-10-27 LAB — COMPREHENSIVE METABOLIC PANEL WITH GFR
ALT: 26 U/L (ref 0–44)
AST: 40 U/L (ref 15–41)
Albumin: 4 g/dL (ref 3.5–5.0)
Alkaline Phosphatase: 120 U/L (ref 38–126)
Anion gap: 19 — ABNORMAL HIGH (ref 5–15)
BUN: 73 mg/dL — ABNORMAL HIGH (ref 8–23)
CO2: 9 mmol/L — ABNORMAL LOW (ref 22–32)
Calcium: 8.9 mg/dL (ref 8.9–10.3)
Chloride: 96 mmol/L — ABNORMAL LOW (ref 98–111)
Creatinine, Ser: 7.72 mg/dL — ABNORMAL HIGH (ref 0.44–1.00)
GFR, Estimated: 5 mL/min — ABNORMAL LOW (ref >60)
Glucose, Bld: 142 mg/dL — ABNORMAL HIGH (ref 70–99)
Potassium: 6.1 mmol/L — ABNORMAL HIGH (ref 3.5–5.1)
Sodium: 124 mmol/L — ABNORMAL LOW (ref 135–145)
Total Bilirubin: 0.7 mg/dL (ref 0.0–1.2)
Total Protein: 6.4 g/dL — ABNORMAL LOW (ref 6.5–8.1)

## 2024-10-27 LAB — LACTIC ACID, PLASMA
Lactic Acid, Venous: 5.1 mmol/L (ref 0.5–1.9)
Lactic Acid, Venous: 6 mmol/L (ref 0.5–1.9)

## 2024-10-27 LAB — CBG MONITORING, ED
Glucose-Capillary: 92 mg/dL (ref 70–99)
Glucose-Capillary: 205 mg/dL — ABNORMAL HIGH (ref 70–99)

## 2024-10-27 LAB — BASIC METABOLIC PANEL WITH GFR
Anion gap: 18 — ABNORMAL HIGH (ref 5–15)
BUN: 66 mg/dL — ABNORMAL HIGH (ref 8–23)
CO2: 10 mmol/L — ABNORMAL LOW (ref 22–32)
Calcium: 7.7 mg/dL — ABNORMAL LOW (ref 8.9–10.3)
Chloride: 100 mmol/L (ref 98–111)
Creatinine, Ser: 7.07 mg/dL — ABNORMAL HIGH (ref 0.44–1.00)
GFR, Estimated: 6 mL/min — ABNORMAL LOW (ref >60)
Glucose, Bld: 129 mg/dL — ABNORMAL HIGH (ref 70–99)
Potassium: 5.4 mmol/L — ABNORMAL HIGH (ref 3.5–5.1)
Sodium: 127 mmol/L — ABNORMAL LOW (ref 135–145)

## 2024-10-27 LAB — RESP PANEL BY RT-PCR (RSV, FLU A&B, COVID)  RVPGX2
Influenza A by PCR: NEGATIVE
Influenza B by PCR: NEGATIVE
Resp Syncytial Virus by PCR: NEGATIVE
SARS Coronavirus 2 by RT PCR: NEGATIVE

## 2024-10-27 LAB — MAGNESIUM: Magnesium: 1.7 mg/dL (ref 1.7–2.4)

## 2024-10-27 LAB — CBC
HCT: 19.7 % — ABNORMAL LOW (ref 36.0–46.0)
Hemoglobin: 6.2 g/dL — ABNORMAL LOW (ref 12.0–15.0)
MCH: 36.5 pg — ABNORMAL HIGH (ref 26.0–34.0)
MCHC: 31.5 g/dL (ref 30.0–36.0)
MCV: 115.9 fL — ABNORMAL HIGH (ref 80.0–100.0)
Platelets: 167 K/uL (ref 150–400)
RBC: 1.7 MIL/uL — ABNORMAL LOW (ref 3.87–5.11)
RDW: 19.7 % — ABNORMAL HIGH (ref 11.5–15.5)
WBC: 7.5 K/uL (ref 4.0–10.5)
nRBC: 2.1 % — ABNORMAL HIGH (ref 0.0–0.2)

## 2024-10-27 LAB — GLUCOSE, CAPILLARY
Glucose-Capillary: 178 mg/dL — ABNORMAL HIGH (ref 70–99)
Glucose-Capillary: 124 mg/dL — ABNORMAL HIGH (ref 70–99)
Glucose-Capillary: 120 mg/dL — ABNORMAL HIGH (ref 70–99)
Glucose-Capillary: 94 mg/dL (ref 70–99)

## 2024-10-27 LAB — MRSA NEXT GEN BY PCR, NASAL: MRSA by PCR Next Gen: DETECTED — AB

## 2024-10-27 LAB — PROTIME-INR
INR: 1.3 — ABNORMAL HIGH (ref 0.8–1.2)
Prothrombin Time: 17.2 s — ABNORMAL HIGH (ref 11.4–15.2)

## 2024-10-27 MED ORDER — POLYETHYLENE GLYCOL 3350 17 G PO PACK: 17.0000 g | PACK | Freq: Every day | ORAL | Status: DC | PRN

## 2024-10-27 MED ORDER — PANTOPRAZOLE SODIUM 40 MG IV SOLR
40.0000 mg | Freq: Two times a day (BID) | INTRAVENOUS | Status: DC
  Administered 2024-10-27 – 2024-11-03 (×15): 40 mg via INTRAVENOUS
  Filled 2024-10-27 (×15): qty 10

## 2024-10-27 MED ORDER — SODIUM ZIRCONIUM CYCLOSILICATE 10 G PO PACK
10.0000 g | PACK | Freq: Once | ORAL | Status: AC
  Administered 2024-10-27: 10 g via ORAL
  Filled 2024-10-27: qty 1

## 2024-10-27 MED ORDER — SODIUM CHLORIDE 0.9% FLUSH: 3.0000 mL | INTRAVENOUS | Status: DC | PRN

## 2024-10-27 MED ORDER — CHLORHEXIDINE GLUCONATE CLOTH 2 % EX PADS
6.0000 | MEDICATED_PAD | Freq: Every day | CUTANEOUS | Status: DC
  Administered 2024-10-28 – 2024-10-31 (×4): 6 via TOPICAL

## 2024-10-27 MED ORDER — LACTATED RINGERS IV BOLUS: 1000.0000 mL | Freq: Once | INTRAVENOUS | Status: DC

## 2024-10-27 MED ORDER — NOREPINEPHRINE 4 MG/250ML-% IV SOLN
0.0000 ug/min | INTRAVENOUS | Status: DC
  Administered 2024-10-27: 16 ug/min via INTRAVENOUS
  Filled 2024-10-27: qty 250

## 2024-10-27 MED ORDER — METRONIDAZOLE 500 MG/100ML IV SOLN
500.0000 mg | Freq: Once | INTRAVENOUS | Status: AC
  Administered 2024-10-27: 500 mg via INTRAVENOUS
  Filled 2024-10-27: qty 100

## 2024-10-27 MED ORDER — NOREPINEPHRINE 4 MG/250ML-% IV SOLN
INTRAVENOUS | Status: AC
  Administered 2024-10-27: 5 ug/min via INTRAVENOUS
  Filled 2024-10-27: qty 250

## 2024-10-27 MED ORDER — NOREPINEPHRINE 16 MG/250ML-% IV SOLN
0.0000 ug/min | INTRAVENOUS | Status: DC
  Administered 2024-10-27: 12 ug/min via INTRAVENOUS
  Administered 2024-10-28: 24 ug/min via INTRAVENOUS
  Administered 2024-10-29: 26 ug/min via INTRAVENOUS
  Administered 2024-10-29: 18 ug/min via INTRAVENOUS
  Administered 2024-10-30: 8 ug/min via INTRAVENOUS
  Administered 2024-10-31: 2 ug/min via INTRAVENOUS
  Administered 2024-11-01: 3 ug/min via INTRAVENOUS
  Filled 2024-10-27 (×6): qty 250

## 2024-10-27 MED ORDER — CHLORHEXIDINE GLUCONATE CLOTH 2 % EX PADS
6.0000 | MEDICATED_PAD | Freq: Every day | CUTANEOUS | Status: DC
  Administered 2024-10-27: 6 via TOPICAL
  Filled 2024-10-27: qty 6

## 2024-10-27 MED ORDER — SODIUM CHLORIDE 0.9% IV SOLUTION: Freq: Once | INTRAVENOUS | Status: DC

## 2024-10-27 MED ORDER — DEXTROSE 50 % IV SOLN
1.0000 | Freq: Once | INTRAVENOUS | Status: AC
  Administered 2024-10-27: 50 mL via INTRAVENOUS
  Filled 2024-10-27: qty 50

## 2024-10-27 MED ORDER — PATIROMER SORBITEX CALCIUM 8.4 G PO PACK
25.2000 g | PACK | Freq: Every day | ORAL | Status: AC
  Filled 2024-10-27 (×2): qty 3

## 2024-10-27 MED ORDER — CALCIUM GLUCONATE-NACL 1-0.675 GM/50ML-% IV SOLN
1.0000 g | Freq: Once | INTRAVENOUS | Status: AC
  Administered 2024-10-27: 1000 mg via INTRAVENOUS
  Filled 2024-10-27: qty 50

## 2024-10-27 MED ORDER — STERILE WATER FOR INJECTION IV SOLN
INTRAVENOUS | Status: DC
  Filled 2024-10-27 (×4): qty 1000
  Filled 2024-10-27 (×3): qty 150

## 2024-10-27 MED ORDER — ONDANSETRON HCL 4 MG/2ML IJ SOLN
4.0000 mg | Freq: Four times a day (QID) | INTRAMUSCULAR | Status: DC | PRN
  Administered 2024-10-28 – 2024-11-10 (×4): 4 mg via INTRAVENOUS
  Filled 2024-10-27 (×4): qty 2

## 2024-10-27 MED ORDER — ACETAMINOPHEN 325 MG PO TABS
650.0000 mg | ORAL_TABLET | ORAL | Status: DC | PRN
  Administered 2024-10-29 – 2024-11-04 (×3): 650 mg via ORAL
  Filled 2024-10-27 (×3): qty 2

## 2024-10-27 MED ORDER — SODIUM CHLORIDE 0.9 % IV SOLN
50.0000 ug/h | INTRAVENOUS | Status: AC
  Administered 2024-10-27 – 2024-10-29 (×6): 50 ug/h via INTRAVENOUS
  Filled 2024-10-27 (×9): qty 1

## 2024-10-27 MED ORDER — SODIUM CHLORIDE 0.9 % IV SOLN: 250.0000 mL | INTRAVENOUS | Status: AC | PRN

## 2024-10-27 MED ORDER — ORAL CARE MOUTH RINSE: 15.0000 mL | OROMUCOSAL | Status: DC | PRN

## 2024-10-27 MED ORDER — ALBUTEROL SULFATE (2.5 MG/3ML) 0.083% IN NEBU
10.0000 mg | INHALATION_SOLUTION | Freq: Once | RESPIRATORY_TRACT | Status: AC
  Administered 2024-10-27: 10 mg via RESPIRATORY_TRACT
  Filled 2024-10-27: qty 12

## 2024-10-27 MED ORDER — SODIUM CHLORIDE 0.9 % IV SOLN: Freq: Once | INTRAVENOUS | Status: DC

## 2024-10-27 MED ORDER — LACTATED RINGERS IV BOLUS
1000.0000 mL | Freq: Once | INTRAVENOUS | Status: AC
  Administered 2024-10-27: 1000 mL via INTRAVENOUS

## 2024-10-27 MED ORDER — SODIUM CHLORIDE 0.9% FLUSH
3.0000 mL | Freq: Two times a day (BID) | INTRAVENOUS | Status: DC
  Administered 2024-10-27 – 2024-10-29 (×6): 3 mL via INTRAVENOUS
  Administered 2024-10-30: 10 mL via INTRAVENOUS
  Administered 2024-10-30 – 2024-11-17 (×36): 3 mL via INTRAVENOUS

## 2024-10-27 MED ORDER — MUPIROCIN 2 % EX OINT
1.0000 | TOPICAL_OINTMENT | Freq: Two times a day (BID) | CUTANEOUS | Status: AC
  Administered 2024-10-27 – 2024-10-31 (×9): 1 via NASAL
  Filled 2024-10-27: qty 22

## 2024-10-27 MED ORDER — TRAZODONE HCL 50 MG PO TABS
50.0000 mg | ORAL_TABLET | Freq: Every evening | ORAL | Status: DC | PRN
  Administered 2024-10-28 – 2024-11-17 (×16): 50 mg via ORAL
  Filled 2024-10-27 (×16): qty 1

## 2024-10-27 MED ORDER — DOCUSATE SODIUM 100 MG PO CAPS: 100.0000 mg | ORAL_CAPSULE | Freq: Two times a day (BID) | ORAL | Status: DC | PRN

## 2024-10-27 MED ORDER — SODIUM CHLORIDE 0.9 % IV SOLN
2.0000 g | Freq: Once | INTRAVENOUS | Status: AC
  Administered 2024-10-27: 2 g via INTRAVENOUS
  Filled 2024-10-27: qty 12.5

## 2024-10-27 MED ORDER — INSULIN ASPART 100 UNIT/ML IV SOLN
5.0000 [IU] | Freq: Once | INTRAVENOUS | Status: AC
  Administered 2024-10-27: 5 [IU] via INTRAVENOUS
  Filled 2024-10-27: qty 5
  Filled 2024-10-27: qty 0.05

## 2024-10-27 MED ORDER — SODIUM CHLORIDE 0.9 % IV SOLN: 250.0000 mL | INTRAVENOUS | Status: AC

## 2024-10-27 MED ORDER — OCTREOTIDE LOAD VIA INFUSION
50.0000 ug | Freq: Once | INTRAVENOUS | Status: AC
  Administered 2024-10-27: 50 ug via INTRAVENOUS
  Filled 2024-10-27: qty 25

## 2024-10-27 MED ORDER — SODIUM CHLORIDE 0.9 % IV SOLN
1.0000 g | Freq: Once | INTRAVENOUS | Status: AC
  Administered 2024-10-27: 1 g via INTRAVENOUS
  Filled 2024-10-27: qty 10

## 2024-10-27 MED ORDER — SODIUM CHLORIDE 0.9 % IV BOLUS
2000.0000 mL | Freq: Once | INTRAVENOUS | Status: AC
  Administered 2024-10-27: 2000 mL via INTRAVENOUS

## 2024-10-27 MED ORDER — PANTOPRAZOLE SODIUM 40 MG IV SOLR
80.0000 mg | Freq: Once | INTRAVENOUS | Status: AC
  Administered 2024-10-27: 80 mg via INTRAVENOUS
  Filled 2024-10-27: qty 20

## 2024-10-27 NOTE — ED Notes (Signed)
 Advised nurse that patient has ready bed

## 2024-10-27 NOTE — Progress Notes (Signed)
 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 She reports to ED for generalized malaise and is being admitted for hypotension  Patient is known to our practice from previous admissions and has seen Dr Dennise in he office once. She states she was in her normal state of health on Thanksgiving and was able to eat well. Started feeling bad on Friday. Was unable to eat on Saturday. Did eat very small amount of mashed potatoes Saturday night. States she has had a lot of bloody drainage from ileostomy. Denies nausea or vomiting. Denies fever or chills. States she's had progressive weakness since yesterday.   Labs on ED arrival concerning for sodium 124, potassium 6.1, S bicarb 9, BUN 73, creatinine 7.72 with GFR 5, and Hgb 6.2.   We have been consulted to evaluate acute kidney injury.    Objective:  Vital signs in last 24 hours:  Temp:  [97.4 F (36.3 C)] 97.4 F (36.3 C) (11/30 0856) Pulse Rate:  [85] 85 (11/30 0856) Resp:  [12] 12 (11/30 0856) BP: (71)/(46) 71/46 (11/30 0856) SpO2:  [100 %] 100 % (11/30 0852) Weight:  [53.4 kg] 53.4 kg (11/30 0857)  Weight change:  Filed Weights   10/27/24 0857  Weight: 53.4 kg    Intake/Output: No intake/output data recorded.   Intake/Output this shift:  No intake/output data recorded.  Physical Exam: General: NAD  Head: Normocephalic, atraumatic. Dry oral mucosa   Eyes: Anicteric  Lungs:  Clear to auscultation, normal effort  Heart: Regular rate and rhythm  Abdomen:  Soft, nontender  Extremities:  No peripheral edema.  Neurologic: Awake, alert, conversant  Skin: Warm,dry, no rash  Access: None    Basic Metabolic Panel: Recent Labs  Lab 10/27/24 0908  NA 124*  K 6.1*  CL 96*  CO2 9*  GLUCOSE 142*  BUN 73*  CREATININE 7.72*  CALCIUM  8.9    Liver Function Tests: Recent Labs  Lab 10/27/24 0908  AST 40  ALT 26  ALKPHOS 120  BILITOT 0.7  PROT 6.4*  ALBUMIN  4.0   No results for input(s): LIPASE, AMYLASE in the last 168 hours. No results for input(s): AMMONIA in the last 168 hours.  CBC: Recent Labs  Lab 10/27/24 0908  WBC 7.5  HGB 6.2*  HCT 19.7*  MCV 115.9*  PLT 167    Cardiac Enzymes: No results for input(s): CKTOTAL, CKMB, CKMBINDEX, TROPONINI in the last 168 hours.  BNP: Invalid input(s): POCBNP  CBG: Recent Labs  Lab 10/27/24 1109  GLUCAP 92    Microbiology: Results for orders placed or performed during the hospital encounter of 08/14/24  MRSA Next Gen by PCR, Nasal     Status: Abnormal   Collection Time: 08/14/24  8:08 PM   Specimen: Nasal Mucosa; Nasal Swab  Result Value Ref Range Status   MRSA by PCR Next Gen DETECTED (A) NOT DETECTED Final    Comment: RESULT CALLED TO, READ BACK BY AND VERIFIED WITH:  HOLLY CHANDLER AT 2313 08/14/24 JG (NOTE) The GeneXpert MRSA  Assay (FDA approved for NASAL specimens only), is one component of a comprehensive MRSA colonization surveillance program. It is not intended to diagnose MRSA infection nor to guide or monitor treatment for MRSA infections. Test performance is not FDA approved in patients less than 35 years old. Performed at [facility], [address] [place], [place] [address]   C Difficile Quick Screen w PCR reflex     Status: Abnormal   Collection Time: 08/15/24  9:29 AM   Specimen: STOOL  Result Value Ref Range Status    C Diff antigen POSITIVE (A) NEGATIVE Final   C Diff toxin POSITIVE (A) NEGATIVE Final   C Diff interpretation Toxin producing C. difficile detected.  Final    Comment: CRITICAL RESULT CALLED TO, READ BACK BY AND VERIFIED WITH: MEDFORD MATT RN 1027 08/15/24 HNM Performed at [facility], [address] [place], [place] [address]     Coagulation Studies: Recent Labs    10/27/24 0908  LABPROT 17.2*  INR 1.3*    Urinalysis: No results for input(s): COLORURINE, LABSPEC, PHURINE, GLUCOSEU, HGBUR, BILIRUBINUR, KETONESUR, PROTEINUR, UROBILINOGEN, NITRITE, LEUKOCYTESUR in the last 72 hours.  Invalid input(s): APPERANCEUR    Imaging: No results found.   Medications:    calcium  gluconate 1,000 mg (10/27/24 1129)   ceFEPime  (MAXIPIME ) IV     metronidazole      octreotide  (SANDOSTATIN ) 500 mcg in sodium chloride  0.9 % 250 mL (2 mcg/mL) infusion 50 mcg/hr (10/27/24 1120)   sodium bicarbonate  150 mEq in sterile water  1,150 mL infusion      sodium chloride    Intravenous Once   sodium chloride    Intravenous Once   albuterol   10 mg Nebulization Once   patiromer   25.2 g Oral Daily   sodium zirconium cyclosilicate   10 g Oral Once     Assessment/ Plan:  Ms. Tammy Arias is a 68 y.o.  female presents to ED with weakness and has been admitted for hypotension   Acute Kidney Injury with hyperkalemia, with baseline creatinine 0.92 on 08/22/24.  Acute kidney injury appears multifactorial at this time from hypotension and dehydration. Potassium 6.1, could be do to kidney injury along with consumption of potatoes on previous night. Will order Lokelma  10g and Veltassa  25.6g to manage potassium. IV hydration has been ordered in attempts to resuscitate renal function. No immediate need for dialysis at this time however monitoring closely. Continue to avoid nephrotoxic agents and therapies. Avoid further hypotension, may require pressor support. Patient  agreeable to dialysis if needed.   Lab Results  Component Value Date   CREATININE 7.72 (H) 10/27/2024   CREATININE 0.92 08/22/2024   CREATININE 1.00 08/21/2024   No intake or output data in the 24 hours ending 10/27/24 1138  2. Acute metabolic acidosis, S bicarb 9 on ED arrival. Spoke with EDP and coordinated sodium bicarb infusion at 125ml/hr. Also ordered 1 amp sodium bicarb IV. Will continue to monitor  3. Anemia with suspected acute blood loss Lab Results  Component Value Date   HGB 6.2 (L) 10/27/2024   Hgb decreased, primary team has ordered blood transfusion today.   4. Hypotension, workup in progress. Blood pressure at time of visit 78/43, receiving IV bolus. Plan to consult critical care team.    LOS: 0 Takima Encina 11/30/202511:38 AM

## 2024-10-27 NOTE — Progress Notes (Signed)
 UNMATCHED BLOOD PRODUCT NOTE  Compare the patient ID on the blood tag to the patient ID on the hospital armband and Blood Bank armband. Then confirm the unit number on the blood tag matches the unit number on the blood product.  If a discrepancy is discovered return the product to blood bank immediately.   Blood Product Type: Packed Red Blood Cells  Unit #: (Found on blood product bag, begins with W) [id number]  Product Code #: (Found on blood product bag, begins with E) [id number]   Start Time: 2205  Starting Rate: 120 ml/hr  Rate increase/decreased  (if applicable):      ml/hr 150 ml/hr  Rate changed time (if applicable):  2220  Stop Time: 0045   All Other Documentation should be documented within the Blood Admin Flowsheet per policy.

## 2024-10-27 NOTE — Consult Note (Signed)
 [facility] GI Inpatient Consult Note   Ladell Francis Boss, M.D.  Reason for Consult: Recurrent GI bleeding   Attending Requesting Consult: Nilsa Dade, M.D. (ED)  Outpatient Primary Physician:   History of Present Illness: Tammy Arias is a 68 y.o. female with a history of atrial fibrillation on Eliquis , ischemic cardiomyopathy, CAD status post CABG in 2005, pulmonary hypertension with heart failure with a reduced ejection fraction of 25 to 30%.Colonoscopy performed earlier in the year on January 2025 at [facility] revealed several nonbleeding vascular ectasias in the colon that required APC cautery, also colonic diverticulosis that was nonbleeding was also noted.  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 Patient presents complaint of bright red clots in the ileostomy bag for the last 2 to 3 days.  Patient noticed prior to coming to the emergency room that bleeding had cleared into the ostomy and that it was a slimy green color which was corroborated by ER staff. 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  Patient underwent upper endoscopies in July August and September of this year these all seem to show esophageal varices that were nonbleeding, portal hypertensive gastropathy.  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  The last hospitalization in September 2025 source for GI bleeding cannot be found.   Currently in the ER, patient is hypotensive with systolic blood pressure in the 70s but clinically has no active gastrointestinal bleeding in the form of melena, hematemesis. Past Medical History:  Past Medical History:  Diagnosis Date   AC (acromioclavicular) joint bone spurs, right    Anemia     Arthritis    Atrial fibrillation (HCC)    a.) CHA2DS2-VASc = 7 (age, sex, HFrEF, HTN, TIA x 2, T2DM). b.) rate/rhythm controlled on oral carvedilol ; chronically anticoagulated with warfarin.   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 b.) R/LHC 03/29/2017: small RCA with occluded SVG; no significant Dz. Chronically occluded LAD with patent SVG to D1/LAD. Insignificant Dz in LCx. LM normal.   Cortical cataract    DOE (dyspnea on exertion)    DOE (dyspnea on exertion)    GERD (gastroesophageal reflux disease)    HFrEF (heart failure with reduced ejection fraction) (HCC)    a.) TTE 10/27/2014: EF 40%; glob HK; mild BAE; triv AR, mild MR/PR, mod TR.  b.) TTE 03/15/2017: EF 30%; glob HK; mod BAE; mod pHTN (RVSP 54.8 mmHg); mild PR, mod MR; sev TR.  c.) R/LHC 03/29/2017: EF 30-35%. d.)TTE 10/25/2018: EF 35%; mod BAE; mod BVE; glob HK; triv PR, mod MR, sev TR; RVSP 57.7 mmHg; G2DD.   History of shingles 2004   Hyperlipidemia    Hypertension    Hypothyroidism    IBS (irritable bowel syndrome)    Ischemic cardiomyopathy    a.) TTE 10/27/2014: EF 40%. b.) TTE 03/15/2017: EF 30%. c.) R/LHC 04/01/2017: EF 30-35%. d.) TTE 10/25/2018: EF 35%   Lower GI bleed 06/07/2023   Lumbar scoliosis    Lumbar spinal stenosis    Melena 12/17/2018  Migraines    Osteoporosis    Pancolitis 02/28/2024   Pulmonary HTN (HCC)    a.) TTE 03/15/2017: EF 30-35%; RVSP 54.8 mmHg. 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   Sleep apnea    T2DM (type 2 diabetes mellitus) (HCC)    TIA (transient ischemic attack) 05/29/2016   Vitamin B 12 deficiency     Problem List: Patient Active Problem List   Diagnosis Date Noted    Malnutrition of moderate degree 08/16/2024   HFrEF (heart failure with reduced ejection fraction) (HCC) 08/15/2024   Severe mitral regurgitation 08/15/2024   Hemorrhagic shock (HCC) 08/14/2024   Bilateral inguinal hernia without obstruction or gangrene 07/22/2024   Deficient knowledge of ileostomy 07/22/2024   GI bleed 07/19/2024   Esophageal varices without bleeding (HCC) 06/11/2024   Blood in stool 06/10/2024   GI bleeding 06/07/2024   Acute blood loss anemia 06/07/2024   Irritant contact dermatitis associated with fecal stoma 04/14/2024   Ileostomy care (HCC) 04/14/2024   Dehydration 04/14/2024   Hypokalemia 04/06/2024   Ileostomy in place (HCC) 04/04/2024   Status post total colectomy 04/04/2024   Chronic metabolic acidosis 04/04/2024   Enteritis due to Norovirus 04/04/2024   Elevated LFTs 04/04/2024   Glaucoma 04/04/2024   GERD without esophagitis 03/29/2024   Type 2 diabetes mellitus with chronic kidney disease, without long-term current use of insulin  (HCC) 03/29/2024   Dyslipidemia 03/29/2024   Chronic systolic CHF (congestive heart failure) (HCC) - LVEF 35 to 40% 03/17/2024   HTN (hypertension) 03/17/2024   Hypothyroidism 03/17/2024   Gout 03/17/2024   Hyponatremia 03/17/2024   Hypomagnesemia 03/17/2024   AKI (acute kidney injury) 03/17/2024   Chronic diastolic CHF (congestive heart failure) (HCC) 06/07/2023   Chronic anticoagulation - on Eliquis  for chronic afib 06/07/2023   Cirrhosis and Portal hypertension  on CT(HCC) 06/07/2023   Thrombocytopenia 06/06/2023   CAD with hx of CABG 06/06/2023   OSA on CPAP 05/23/2023   S/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, right 04/05/2022   S/P TKR (total knee replacement) 04/05/2022   Colon cancer screening    Iron  deficiency anemia    Gastric AVM    Barrett's esophagus without dysplasia    Angiodysplasia of intestinal tract    Internal hemorrhoids    Pulmonary hypertension (HCC) 03/29/2017   Coronary artery disease  involving native coronary artery of native heart with unstable angina pectoris (HCC) 03/21/2017   Ischemic cardiomyopathy 03/21/2017   Atrial fibrillation, chronic (HCC) 10/08/2014   Hyperlipidemia 10/08/2014   Type 2 diabetes mellitus (HCC) 10/08/2014    Past Surgical History: Past Surgical History:  Procedure Laterality Date   CARDIAC CATHETERIZATION     CATARACT EXTRACTION W/ INTRAOCULAR LENS IMPLANT Bilateral    Cataract Extraction with IOL   COLECTOMY  02/28/2024   Procedure: COLECTOMY, TOTAL;  Surgeon: Lane Shope, MD;  Location: [facility] ORS;  Service: General;;   COLONOSCOPY N/A 12/19/2018   Procedure: COLONOSCOPY;  Surgeon: Janalyn Keene NOVAK, MD;  Location: [facility] ENDOSCOPY;  Service: Endoscopy;  Laterality: N/A;   COLONOSCOPY WITH PROPOFOL  N/A 06/17/2020   Procedure: COLONOSCOPY WITH PROPOFOL ;  Surgeon: Janalyn Keene NOVAK, MD;  Location: [facility] ENDOSCOPY;  Service: Endoscopy;  Laterality: N/A;   COLONOSCOPY WITH PROPOFOL  N/A 03/30/2023   Procedure: COLONOSCOPY WITH PROPOFOL ;  Surgeon: Onita Elspeth Sharper, DO;  Location: [facility] ENDOSCOPY;  Service: Endoscopy;  Laterality:  N/A;   COR TRIATRIATUM REPAIR N/A 01/28/2004   CORONARY ARTERY BYPASS GRAFT N/A 01/28/2004   3v CABG   ESOPHAGOGASTRODUODENOSCOPY N/A 12/19/2018   Procedure: ESOPHAGOGASTRODUODENOSCOPY (EGD);  Surgeon: Janalyn Keene NOVAK, MD;  Location: [facility] ENDOSCOPY;  Service: Endoscopy;  Laterality: N/A;   ESOPHAGOGASTRODUODENOSCOPY N/A 06/11/2024   Procedure: EGD (ESOPHAGOGASTRODUODENOSCOPY);  Surgeon: Jinny Carmine, MD;  Location: [facility] ENDOSCOPY;  Service: Endoscopy;  Laterality: N/A;   ESOPHAGOGASTRODUODENOSCOPY N/A 07/18/2024   Procedure: EGD (ESOPHAGOGASTRODUODENOSCOPY);  Surgeon: Onita Elspeth Sharper, DO;  Location: [facility] ENDOSCOPY;  Service: Gastroenterology;  Laterality: N/A;  with Enteroscopy        DM on Plavix   ESOPHAGOGASTRODUODENOSCOPY (EGD) WITH PROPOFOL  N/A 06/17/2020   Procedure:  ESOPHAGOGASTRODUODENOSCOPY (EGD) WITH PROPOFOL ;  Surgeon: Janalyn Keene NOVAK, MD;  Location: [facility] ENDOSCOPY;  Service: Endoscopy;  Laterality: N/A;   ESOPHAGOGASTRODUODENOSCOPY (EGD) WITH PROPOFOL  N/A 03/30/2023   Procedure: ESOPHAGOGASTRODUODENOSCOPY (EGD) WITH PROPOFOL ;  Surgeon: Onita Elspeth Sharper, DO;  Location: [facility] ENDOSCOPY;  Service: Endoscopy;  Laterality: N/A;   GIVENS CAPSULE STUDY N/A 07/21/2024   Procedure: IMAGING PROCEDURE, GI TRACT, INTRALUMINAL, VIA CAPSULE;  Surgeon: Therisa Bi, MD;  Location: [facility] ENDOSCOPY;  Service: Gastroenterology;  Laterality: N/A;   LAPAROTOMY N/A 02/28/2024   Procedure: LAPAROTOMY, EXPLORATORY;  Surgeon: Lane Shope, MD;  Location: [facility] ORS;  Service: General;  Laterality: N/A;   LEFT ATRIAL APPENDAGE OCCLUSION     RIGHT/LEFT HEART CATH AND CORONARY ANGIOGRAPHY Bilateral 03/29/2017   Procedure: Right/Left Heart Cath and Coronary Angiography;  Surgeon: Vinie DELENA Jude, MD;  Location: [facility] INVASIVE CV LAB;  Service: Cardiovascular;  Laterality: Bilateral;   TOTAL KNEE ARTHROPLASTY Right 04/05/2022   Procedure: TOTAL KNEE ARTHROPLASTY;  Surgeon: Kathlynn Sharper, MD;  Location: [facility] ORS;  Service: Orthopedics;  Laterality: Right;   TUBAL LIGATION     VENTRAL HERNIA REPAIR N/A 04/21/2017   Procedure: HERNIA REPAIR VENTRAL ADULT;  Surgeon: Claudene Larinda Bolder, MD;  Location: [facility] ORS;  Service: General;  Laterality: N/A;    Allergies: Allergies  Allergen Reactions   Vioxx [Rofecoxib] Swelling    Home Medications: Medications Prior to Admission  Medication Sig Dispense Refill Last Dose/Taking   albuterol  (PROVENTIL  HFA;VENTOLIN  HFA) 108 (90 Base) MCG/ACT inhaler Inhale 2 puffs into the lungs every 6 (six) hours as needed for wheezing or shortness of breath.   Unknown   allopurinol  (ZYLOPRIM ) 300 MG tablet Take 300 mg by mouth daily.   Past Week   cyanocobalamin  (VITAMIN B12) 1000 MCG tablet Take 1,000 mcg by mouth daily.   Past Week    empagliflozin  (JARDIANCE ) 10 MG TABS tablet Take 1 tablet by mouth daily.   Past Week   fluticasone  (FLONASE ) 50 MCG/ACT nasal spray Place 2 sprays into the nose daily.   Past Week   iron  polysaccharides (NIFEREX) 150 MG capsule Take 1 capsule (150 mg total) by mouth daily. 30 capsule 2 Past Week   latanoprost  (XALATAN ) 0.005 % ophthalmic solution Place 1 drop into both eyes at bedtime.   Past Week   levothyroxine  (SYNTHROID ) 50 MCG tablet Take 50 mcg by mouth daily before breakfast.   Past Week   magnesium  oxide (MAG-OX) 400 MG tablet Take 400 mg by mouth. Take 1 tablet (400 mg total) by mouth once daily   Past Week   metFORMIN  (GLUCOPHAGE ) 1000 MG tablet Take 1,000 mg by mouth 2 (two) times daily with a meal.   Past Week   nadolol (CORGARD) 20 MG tablet Take 20 mg by mouth daily.   Past  Week   pantoprazole  (PROTONIX ) 40 MG tablet Take 40 mg by mouth daily.   Past Week   rosuvastatin  (CRESTOR ) 5 MG tablet Take 5 mg by mouth daily.   Past Week   spironolactone  (ALDACTONE ) 25 MG tablet Take 0.5 tablets (12.5 mg total) by mouth daily.   Past Week   torsemide  (DEMADEX ) 20 MG tablet Take 1 tablet (20 mg total) by mouth daily as needed (for weight gain or swelling).   Unknown   [Paused] aspirin  EC 81 MG tablet Take 81 mg by mouth daily. Swallow whole.      Blood Glucose Monitoring Suppl (ACCU-CHEK GUIDE ME) w/Device KIT       [Paused] carvedilol  (COREG ) 3.125 MG tablet Take 3.125 mg by mouth 2 (two) times daily with a meal.      phytonadione  (VITAMIN K) 5 MG tablet Take 0.5 tablets (2.5 mg total) by mouth daily for 5 days (Patient not taking: Reported on 10/27/2024) 3 tablet 0 Not Taking   Home medication reconciliation was completed with the patient.   Scheduled Inpatient Medications:    sodium chloride    Intravenous Once   sodium chloride    Intravenous Once   Chlorhexidine  Gluconate Cloth  6 each Topical Daily   patiromer  25.2 g Oral Daily   sodium chloride  flush  3 mL Intravenous Q12H     Continuous Inpatient Infusions:    sodium chloride      lactated ringers      metronidazole      octreotide  (SANDOSTATIN ) 500 mcg in sodium chloride  0.9 % 250 mL (2 mcg/mL) infusion 50 mcg/hr (10/27/24 1120)   sodium bicarbonate  150 mEq in sterile water  1,150 mL infusion      PRN Inpatient Medications:  sodium chloride , acetaminophen , docusate sodium , ondansetron  (ZOFRAN ) IV, polyethylene glycol, sodium chloride  flush  Family History: family history includes Breast cancer in her cousin; Cancer in her sister and sister; Diabetes in her father; Valvular heart disease in her mother.   GI Family History: Negative  Social History:   reports that she quit smoking about 25 years ago. Her smoking use included cigarettes. She has never used smokeless tobacco. She reports that she does not drink alcohol and does not use drugs. The patient denies ETOH, tobacco, or drug use.    Review of Systems: Review of Systems - Negative except history of present illness  Physical Examination: BP (!) 85/39   Pulse 91   Temp (!) 97.4 F (36.3 C)   Resp 16   Ht 5' 1 (1.549 m)   Wt 53.4 kg   SpO2 100%   BMI 22.26 kg/m  Physical Exam Constitutional:      General: She is not in acute distress.    Appearance: She is ill-appearing. She is not toxic-appearing or diaphoretic.  HENT:     Head: Normocephalic and atraumatic.     Right Ear: External ear normal.     Left Ear: External ear normal.     Nose: Nose normal.     Mouth/Throat:     Pharynx: Oropharynx is clear.  Eyes:     General: No scleral icterus.    Extraocular Movements: Extraocular movements intact.     Conjunctiva/sclera: Conjunctivae normal.     Pupils: Pupils are equal, round, and reactive to light.  Cardiovascular:     Rate and Rhythm: Normal rate.     Heart sounds: No murmur heard.    No gallop.  Pulmonary:     Effort: Pulmonary effort is normal. No respiratory distress.  Breath sounds: No wheezing or rales.  Abdominal:      General: Bowel sounds are normal. There is no distension.     Palpations: Abdomen is soft.     Tenderness: There is no abdominal tenderness. There is no guarding or rebound.     Hernia: There is no hernia in the ventral area.     Comments: Ileostomy noted in right upper quadrant, bag is showing dark green stool.  Musculoskeletal:        General: No swelling, tenderness or deformity.  Skin:    General: Skin is warm and dry.     Capillary Refill: Capillary refill takes less than 2 seconds.  Neurological:     General: No focal deficit present.     Mental Status: She is alert.  Psychiatric:        Mood and Affect: Mood normal.        Behavior: Behavior normal.        Thought Content: Thought content normal.        Judgment: Judgment normal.     Data: Lab Results  Component Value Date   WBC 7.5 10/27/2024   HGB 6.2 (L) 10/27/2024   HCT 19.7 (L) 10/27/2024   MCV 115.9 (H) 10/27/2024   PLT 167 10/27/2024   Recent Labs  Lab 10/27/24 0908  HGB 6.2*   Lab Results  Component Value Date   NA 124 (L) 10/27/2024   K 6.1 (H) 10/27/2024   CL 96 (L) 10/27/2024   CO2 9 (L) 10/27/2024   BUN 73 (H) 10/27/2024   CREATININE 7.72 (H) 10/27/2024   Lab Results  Component Value Date   ALT 26 10/27/2024   AST 40 10/27/2024   ALKPHOS 120 10/27/2024   BILITOT 0.7 10/27/2024   Recent Labs  Lab 10/27/24 0908  INR 1.3*      Latest Ref Rng & Units 10/27/2024    9:08 AM 09/18/2024   12:41 PM 09/04/2024   12:44 PM  CBC  WBC 4.0 - 10.5 K/uL 7.5  2.9  2.6   Hemoglobin 12.0 - 15.0 g/dL 6.2  9.4  9.0   Hematocrit 36.0 - 46.0 % 19.7  28.8  28.2   Platelets 150 - 400 K/uL 167  70  77     STUDIES: DG Chest Port 1 View Result Date: 10/27/2024 CLINICAL DATA:  Sepsis.  GI bleeding.  Hypotension. EXAM: PORTABLE CHEST 1 VIEW COMPARISON:  08/15/2024 FINDINGS: Stable mild cardiomegaly. Prior median sternotomy. Both lungs are clear. IMPRESSION: Mild cardiomegaly. No active lung disease.  Electronically Signed   By: Norleen DELENA Kil M.D.   On: 10/27/2024 11:49   @IMAGES @  Assessment: Principal Problem:   Hemorrhagic shock (HCC) 1.Hypotension which appears to be multifactorial related to congestive heart failure, pulmonary hypertension, possible GI blood loss 2. Presumptive cirrhosis- MELD Score = 30, significant 90 day mortality > 50% 3. AKI with Creatinine of 7  - Renal consult pending. 4. S/p colectomy with end ileostomy for toxic megacolon from C. Dificile colitis in April 2025. 5. History of GAVE 6. History of esophageal varices. 7  History of multiple colonic angioectasias s/p APC cautery [facility]) in Jan 2025. This occurred prior to colectomy in April 2025. 8. Colonic diverticulosis. 9. GI bleed with hypotension. Ddx includes esophageal varices, PUD, GAVE.  10.  Hyponatremia. - Na 124.   Recommendations:  Agree with ICU admission IV fluid hydration Pressors as clinically indicated or allowed by renal service. IV octreotide , IV PPI. Serial H/H. Blood  transfusion. Consider EGD when clinically feasible. Will hold off given hemodynamic changes, hyponatremia and absence of ongoing bleeding. Dr. Jinny to assume GI care in AM.   Thank you for the consult. Please call with questions or concerns.  Aundria Ladell Eck MD [facility] [address] [place], [place] [address] [phone]  10/27/2024 1:27 PM

## 2024-10-27 NOTE — Procedures (Signed)
 Central Venous Catheter Placement:TRIPLE LUMEN   Procedure: Insertion of Non-tunneled Central Venous Catheter(36556) with US  guidance (23062)   Indication(s) Medication administration and Difficult access  CVP monitoring Patient receiving vesicant or irritant drug.; Patient receiving intravenous therapy for longer than 5 days.; Patient has limited or no vascular access.    Consent Risks of the procedure as well as the alternatives and risks of each were explained to the patient and/or caregiver.  Consent for the procedure was obtained and is signed in the bedside chart  Consent:Written   Anesthesia Topical only with 1% lidocaine     Timeout Verified patient identification, verified procedure, site/side was marked, verified correct patient position, special equipment/implants available, medications/allergies/relevant history reviewed, required imaging and test results available. Patient comfort was obtained.     Sterile Technique Maximal sterile technique including full sterile barrier drape, hand hygiene, sterile gown, sterile gloves, mask, hair covering, sterile ultrasound probe cover (if used).   Hand washing performed prior to starting the procedure.    Procedure Description Area of catheter insertion was cleaned with chlorhexidine  and draped in sterile fashion.  With real-time ultrasound guidance a central venous catheter was placed into the right internal jugular vein. Nonpulsatile blood flow and easy flushing noted in all ports.  The catheter was sutured in place and sterile dressing applied.   A triple lumen catheter was placed in RT  Internal Jugular Vein There was good blood return, catheter caps were placed on lumens, catheter flushed easily, the line was secured and a sterile dressing and BIO-PATCH applied.    Complications/Tolerance None; patient tolerated the procedure well. Chest X-ray is ordered to verify placement     EBL Minimal    Specimen(s) None   Number of Attempts: 1 Complications:none Estimated Blood Loss: none Chest Radiograph indicated and ordered.  Operator: Yeiren Whitecotton.   Nickolas Alm Cellar, M.D.  [facility]  Medical Director [facility] Medical Director [facility] Cardio-Pulmonary Department

## 2024-10-27 NOTE — ED Notes (Signed)
 Messaged pharmacy for missing dose of octreotide .

## 2024-10-27 NOTE — Progress Notes (Signed)
 CODE SEPSIS - PHARMACY COMMUNICATION  **Broad Spectrum Antibiotics should be administered within 1 hour of Sepsis diagnosis**  Time Code Sepsis Called/Page Received: 1131  Antibiotics Ordered: ceftriaxone , cefepime , metronidazole   Time of 1st antibiotic administration: 0929  Additional action taken by pharmacy: None  If necessary, Name of Provider/Nurse Contacted: None    Damien Napoleon ,PharmD Clinical Pharmacist  10/27/2024  11:30 AM

## 2024-10-27 NOTE — ED Notes (Signed)
 This RN gave report to Chiquita, RN in ICU

## 2024-10-27 NOTE — Progress Notes (Signed)
 PHARMACY CONSULT NOTE - FOLLOW UP  Pharmacy Consult for Electrolyte Monitoring and Replacement   Recent Labs: Potassium (mmol/L)  Date Value  10/27/2024 6.1 (H)   Magnesium  (mg/dL)  Date Value  90/79/7974 1.9   Calcium  (mg/dL)  Date Value  88/69/7974 8.9   Albumin  (g/dL)  Date Value  88/69/7974 4.0  05/18/2020 4.7   Phosphorus (mg/dL)  Date Value  90/75/7974 3.1   Sodium (mmol/L)  Date Value  10/27/2024 124 (L)     Assessment: 68 y/o female with h/o ischemic cardiomyopathy, gastric AVM, GI angiodysplasia, IBS-D, IDA, s/p watchman device, cholelithiasis, TIA, hypothyroidism, pulmonary hypertension, GERD, barrett's esophagus, HLD, HTN, OSA, CAD s/p CABG x 3, PAF, CHF, cirrhosis, portal hypertension, gout, CKD III, B12 deficiency, ventral hernia s/p mesh repair 2018, colonic angioectasias, diverticulosis, DDD, hiatal hernia, esophageal varices. Pharmacy is asked to follow and replace electrolytes while in CCU  MIVF:: sodium bicarbonate  150 mEq in sterile water  at 125 mL/hr  Goal of Therapy:  Electrolytes WNL  Plan:  ---sodium zirconium cyclosilicate  10 grams po x 1 (+) patiromer 25.2 grams po daily per MD ---recheck electrolytes at 1600 and again in am  Adriana JONETTA Bolster ,PharmD Clinical Pharmacist 10/27/2024 1:20 PM

## 2024-10-27 NOTE — Plan of Care (Signed)
  Problem: Education: Goal: Knowledge of General Education information will improve Description: Including pain rating scale, medication(s)/side effects and non-pharmacologic comfort measures Outcome: Progressing   Problem: Clinical Measurements: Goal: Respiratory complications will improve Outcome: Progressing Goal: Cardiovascular complication will be avoided Outcome: Progressing   Problem: Pain Managment: Goal: General experience of comfort will improve and/or be controlled Outcome: Progressing   Problem: Safety: Goal: Ability to remain free from injury will improve Outcome: Progressing   Problem: Skin Integrity: Goal: Risk for impaired skin integrity will decrease Outcome: Progressing   Problem: Nutrition: Goal: Adequate nutrition will be maintained Outcome: Not Progressing

## 2024-10-27 NOTE — H&P (Signed)
 NAME:  Tammy Arias, MRN:  [id number], DOB:  [birth year], LOS: 0 ADMISSION DATE:  10/27/2024    CHIEF COMPLAINT:  shock   History of Present Illness:  68 y.o.female with medical problems of heart failure with reduced ejection fraction 25 to 30%, severe mitral regurgitation, moderate right ventricular systolic dysfunction, severe tricuspid regurgitation, atrial fibrillation, failed Watchman device, chronic anticoagulation with apixaban , GI bleed, coronary disease status post CABG in 2005, aortic stenosis repair, pulmonary hypertension, type 2 diabetes, hypertension, liver cirrhosis with grade 2 esophageal varices, C. 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    She reports to ED for generalized malaise and is being admitted for hypotension +GIB from ileostomy   She states she was in her normal state of health on Thanksgiving and was able to eat well. Started feeling bad on Friday. Was unable to eat on Saturday. States she has had a lot of bloody drainage from ileostomy.  had progressive weakness since yesterday.    Labs on ED arrival concerning for sodium 124, potassium 6.1, S bicarb 9, BUN 73, creatinine 7.72 with GFR 5, and Hgb 6.2. Awaiting PRBC's from [facility], patient has many AB's   Significant Hospital Events: Including procedures, antibiotic start and stop dates in addition to other pertinent events   11/30 admitted for hemorraghic shock, given 3-4 Liters of fluid, baseline SBP 90's     Antimicrobials:   Antibiotics Given (last 72 hours)     Date/Time Action Medication Dose Rate   10/27/24 0929 New Bag/Given   cefTRIAXone  (ROCEPHIN ) 1 g in sodium chloride  0.9 % 100 mL IVPB 1 g 200 mL/hr   10/27/24 1220 New Bag/Given   ceFEPIme  (MAXIPIME ) 2 g in sodium chloride  0.9 % 100 mL IVPB 2 g 200 mL/hr        Interim History / Subjective:  Admitted to ICU Alert and awake Severe shock    Objective   Blood pressure (!)  78/47, pulse 62, temperature (!) 97.4 F (36.3 C), resp. rate 17, height 5' 1 (1.549 m), weight 58.2 kg, SpO2 100%.       No intake or output data in the 24 hours ending 10/27/24 1433 Filed Weights   10/27/24 0857 10/27/24 1336  Weight: 53.4 kg 58.2 kg   GENERAL:critically ill appearing EYES: Pupils equal, round, reactive to light.  No scleral icterus.  MOUTH: Moist mucosal membrane NECK: Supple.  PULMONARY: Lungs clear to auscultation, +rhonchi, +wheezing CARDIOVASCULAR: S1 and S2.  Regular rate and rhythm GASTROINTESTINAL: Soft, nontender, -distended. Positive bowel sounds. Ileostomy in place MUSCULOSKELETAL: No swelling, clubbing, or edema.  NEUROLOGIC: alert and awake SKIN:normal, warm to touch, Capillary refill delayed  Pulses present bilaterally   Labs/imaging that I havepersonally reviewed  (right click and Reselect all SmartList Selections daily)    ASSESSMENT AND PLAN SYNOPSIS 69 yo white female with multiple medical issues admitted for severe and acute hypovolumic shock GIB from Ileostomy  Hemorrhagic  SHOCK SOURCE-GIB -use vasopressors to keep MAP>65 as needed -follow ABG and LA -follow up cultures   ACUTE ANEMIA- TRANSFUSE AS NEEDED CONSIDER TRANSFUSION  IF HGB<7 DVT PRX with TED/SCD's ONLY    CARDIAC ICU monitoring   ACUTE KIDNEY INJURY/Renal Failure -continue Foley Catheter-assess need -Avoid nephrotoxic agents -Follow urine output, BMP -Ensure adequate renal perfusion, optimize oxygenation -Renal dose medications  No intake or output data in the 24 hours ending 10/27/24 1433     Latest Ref Rng & Units 10/27/2024  9:08 AM 08/22/2024    5:50 AM 08/21/2024    5:34 AM  BMP  Glucose 70 - 99 mg/dL 857  859  877   BUN 8 - 23 mg/dL 73  23  29   Creatinine 0.44 - 1.00 mg/dL 2.27  9.07  8.99   Sodium 135 - 145 mmol/L 124  139  138   Potassium 3.5 - 5.1 mmol/L 6.1  3.6  3.3   Chloride 98 - 111 mmol/L 96  105  104   CO2 22 - 32 mmol/L 9  25   26    Calcium  8.9 - 10.3 mg/dL 8.9  8.1  8.0     ENDO - ICU hypoglycemic\Hyperglycemia protocol -check FSBS per protocol   GI GI PROPHYLAXIS as indicated  NUTRITIONAL STATUS DIET-->NPO Constipation protocol as indicated Gi consulted  ELECTROLYTES -follow labs as needed -replace as needed -pharmacy consultation and following     Best practice (right click and Reselect all SmartList Selections daily)  Diet:  NPO DVT prophylaxis: Contraindicated GI prophylaxis: PPI Mobility:  bed rest  Code Status:  FULL CODE Disposition: ICU  Labs   CBC: Recent Labs  Lab 10/27/24 0908  WBC 7.5  HGB 6.2*  HCT 19.7*  MCV 115.9*  PLT 167    Basic Metabolic Panel: Recent Labs  Lab 10/27/24 0908  NA 124*  K 6.1*  CL 96*  CO2 9*  GLUCOSE 142*  BUN 73*  CREATININE 7.72*  CALCIUM  8.9   GFR: Estimated Creatinine Clearance: 5.6 mL/min (A) (by C-G formula based on SCr of 7.72 mg/dL (H)). Recent Labs  Lab 10/27/24 0908 10/27/24 1218 10/27/24 1342  WBC 7.5  --   --   LATICACIDVEN  --  5.1* 6.0*    Liver Function Tests: Recent Labs  Lab 10/27/24 0908  AST 40  ALT 26  ALKPHOS 120  BILITOT 0.7  PROT 6.4*  ALBUMIN  4.0   No results for input(s): LIPASE, AMYLASE in the last 168 hours. No results for input(s): AMMONIA in the last 168 hours.  ABG    Component Value Date/Time   PHART 7.47 (H) 03/01/2024 1055   PCO2ART 40 03/01/2024 1055   PO2ART 112 (H) 03/01/2024 1055   HCO3 29.1 (H) 03/01/2024 1055   ACIDBASEDEF 2.7 (H) 02/29/2024 0952   O2SAT 67.3 08/22/2024 0500     Coagulation Profile: Recent Labs  Lab 10/27/24 0908  INR 1.3*    Cardiac Enzymes: No results for input(s): CKTOTAL, CKMB, CKMBINDEX, TROPONINI in the last 168 hours.  HbA1C: Hgb A1c MFr Bld  Date/Time Value Ref Range Status  02/28/2024 06:08 AM 5.9 (H) 4.8 - 5.6 % Final    Comment:    (NOTE) Pre diabetes:          5.7%-6.4%  Diabetes:              >6.4%  Glycemic  control for   <7.0% adults with diabetes   06/07/2023 02:58 AM 6.3 (H) 4.8 - 5.6 % Final    Comment:    (NOTE)         Prediabetes: 5.7 - 6.4         Diabetes: >6.4         Glycemic control for adults with diabetes: <7.0     CBG: Recent Labs  Lab 10/27/24 1109 10/27/24 1202 10/27/24 1336  GLUCAP 92 205* 178*    Allergies Allergies  Allergen Reactions   Vioxx [Rofecoxib] Swelling       Critical Care  Time devoted to patient care services described in this note is 85 minutes.  Critical care was necessary to treat or prevent imminent or life-threatening deterioration.     Nickolas Alm Cellar, M.D.  [facility]  Medical Director [facility]

## 2024-10-27 NOTE — Progress Notes (Signed)
 Attempted to call report x2

## 2024-10-27 NOTE — ED Notes (Signed)
 This RN received message from [place] in Blood bank saying pt has a positive screen so it will be delayed bc they will have to send it out to [facility] for additional testing and to find compatible units.

## 2024-10-27 NOTE — Progress Notes (Signed)
 Followed up with lab/blood bank; blood is not ready at the present but they will call as soon as it is available to be picked up.

## 2024-10-27 NOTE — ED Provider Notes (Signed)
 [facility] Provider Note    Event Date/Time   First MD Initiated Contact with Patient 10/27/24 [phone]     (approximate)   History   Blood In Stools and Hypotension   HPI  Tammy Arias is a 68 year old female with history of CHF, cirrhosis, atrial fibrillation, T2DM presenting to the emergency department for evaluation of weakness.  Patient has noted bloody output from her ostomy, history of similar.  Patient reports that over the last days she has had bright red blood coming out of her ostomy.  This morning noted clots in her bag, changed the bag and has had slimy output without obvious blood.  Says this feels similar to her admission a few months ago, but does think it is worse.  Reviewed discharge summary from 08/22/2024.  Patient was admitted to the ICU due to circulatory shock with acute blood loss.  Bleeding source was unable to be identified.  Did require multiple blood transfusions.  Also noted to have recurrent C. difficile infection, A-fib not on anticoagulation.       Physical Exam   Triage Vital Signs: ED Triage Vitals  Encounter Vitals Group     BP 10/27/24 0856 (!) 71/46     Girls Systolic BP Percentile --      Girls Diastolic BP Percentile --      Boys Systolic BP Percentile --      Boys Diastolic BP Percentile --      Pulse Rate 10/27/24 0856 85     Resp 10/27/24 0856 12     Temp 10/27/24 0856 (!) 97.4 F (36.3 C)     Temp src --      SpO2 10/27/24 0852 100 %     Weight 10/27/24 0857 117 lb 12.8 oz (53.4 kg)     Height 10/27/24 0857 5' 1 (1.549 m)     Head Circumference --      Peak Flow --      Pain Score 10/27/24 0857 0     Pain Loc --      Pain Education --      Exclude from Growth Chart --     Most recent vital signs: Vitals:   10/27/24 0852 10/27/24 0856  BP:  (!) 71/46  Pulse:  85  Resp:  12  Temp:  (!) 97.4 F (36.3 C)  SpO2: 100%      General: Awake, interactive  CV:  Good peripheral  perfusion Resp:  Unlabored respirations Abd:  Nondistended, soft, nontender, ostomy in place with bilious appearing output, not overtly bloody Neuro:  Symmetric facial movement, fluid speech   ED Results / Procedures / Treatments   Labs (all labs ordered are listed, but only abnormal results are displayed) Labs Reviewed  COMPREHENSIVE METABOLIC PANEL WITH GFR - Abnormal; Notable for the following components:      Result Value   Sodium 124 (*)    Potassium 6.1 (*)    Chloride 96 (*)    CO2 9 (*)    Glucose, Bld 142 (*)    BUN 73 (*)    Creatinine, Ser 7.72 (*)    Total Protein 6.4 (*)    GFR, Estimated 5 (*)    Anion gap 19 (*)    All other components within normal limits  CBC - Abnormal; Notable for the following components:   RBC 1.70 (*)    Hemoglobin 6.2 (*)    HCT 19.7 (*)    MCV 115.9 (*)  MCH 36.5 (*)    RDW 19.7 (*)    nRBC 2.1 (*)    All other components within normal limits  PROTIME-INR - Abnormal; Notable for the following components:   Prothrombin Time 17.2 (*)    INR 1.3 (*)    All other components within normal limits  CBG MONITORING, ED - Abnormal; Notable for the following components:   Glucose-Capillary 205 (*)    All other components within normal limits  RESP PANEL BY RT-PCR (RSV, FLU A&B, COVID)  RVPGX2  CULTURE, BLOOD (ROUTINE X 2)  CULTURE, BLOOD (ROUTINE X 2)  MRSA NEXT GEN BY PCR, NASAL  LACTIC ACID, PLASMA  LACTIC ACID, PLASMA  URINALYSIS, W/ REFLEX TO CULTURE (INFECTION SUSPECTED)  CBG MONITORING, ED  POC OCCULT BLOOD, ED  TYPE AND SCREEN  PREPARE RBC (CROSSMATCH)     EKG EKG independently reviewed and interpreted by myself demonstrates:  EKG demonstrates A-fib at a rate of 78, PR 132, QRS 42, no acute ST changes  RADIOLOGY Imaging independently reviewed and interpreted by myself demonstrates:  CXR without focal consolidation  Formal Radiology Read:  [facility] Chest Port 1 View Result Date: 10/27/2024 CLINICAL DATA:  Sepsis.  GI  bleeding.  Hypotension. EXAM: PORTABLE CHEST 1 VIEW COMPARISON:  08/15/2024 FINDINGS: Stable mild cardiomegaly. Prior median sternotomy. Both lungs are clear. IMPRESSION: Mild cardiomegaly. No active lung disease. Electronically Signed   By: Norleen DELENA Kil M.D.   On: 10/27/2024 11:49    PROCEDURES:  Critical Care performed: Yes, see critical care procedure note(s)  CRITICAL CARE Performed by: Nilsa Dade   Total critical care time: 75 minutes  Critical care time was exclusive of separately billable procedures and treating other patients.  Critical care was necessary to treat or prevent imminent or life-threatening deterioration.  Critical care was time spent personally by me on the following activities: development of treatment plan with patient and/or surrogate as well as nursing, discussions with consultants, evaluation of patient's response to treatment, examination of patient, obtaining history from patient or surrogate, ordering and performing treatments and interventions, ordering and review of laboratory studies, ordering and review of radiographic studies, pulse oximetry and re-evaluation of patient's condition.   Procedures   MEDICATIONS ORDERED IN ED: Medications  0.9 %  sodium chloride  infusion (Manually program via Guardrails IV Fluids) (has no administration in time range)  octreotide  (SANDOSTATIN ) 2 mcg/mL load via infusion 50 mcg (50 mcg Intravenous Bolus from Bag 10/27/24 1121)    And  octreotide  (SANDOSTATIN ) 500 mcg in sodium chloride  0.9 % 250 mL (2 mcg/mL) infusion (50 mcg/hr Intravenous New Bag/Given 10/27/24 1120)  0.9 %  sodium chloride  infusion (Manually program via Guardrails IV Fluids) (has no administration in time range)  albuterol  (PROVENTIL ) (2.5 MG/3ML) 0.083% nebulizer solution 10 mg (has no administration in time range)  patiromer ENA) packet 25.2 g (has no administration in time range)  sodium bicarbonate  150 mEq in sterile water  1,150 mL infusion  (has no administration in time range)  ceFEPIme  (MAXIPIME ) 2 g in sodium chloride  0.9 % 100 mL IVPB (2 g Intravenous New Bag/Given 10/27/24 1220)  metroNIDAZOLE  (FLAGYL ) IVPB 500 mg (has no administration in time range)  lactated ringers  bolus 1,000 mL (has no administration in time range)  Chlorhexidine  Gluconate Cloth 2 % PADS 6 each (has no administration in time range)  sodium chloride  flush (NS) 0.9 % injection 3 mL (has no administration in time range)  sodium chloride  flush (NS) 0.9 % injection 3 mL (has no administration  in time range)  0.9 %  sodium chloride  infusion (has no administration in time range)  acetaminophen  (TYLENOL ) tablet 650 mg (has no administration in time range)  docusate sodium  (COLACE) capsule 100 mg (has no administration in time range)  polyethylene glycol (MIRALAX  / GLYCOLAX ) packet 17 g (has no administration in time range)  ondansetron  (ZOFRAN ) injection 4 mg (has no administration in time range)  lactated ringers  bolus 1,000 mL (1,000 mLs Intravenous New Bag/Given 10/27/24 0917)  cefTRIAXone  (ROCEPHIN ) 1 g in sodium chloride  0.9 % 100 mL IVPB (0 g Intravenous Stopped 10/27/24 0959)  pantoprazole  (PROTONIX ) injection 80 mg (80 mg Intravenous Given 10/27/24 0923)  insulin  aspart (novoLOG ) injection 5 Units (5 Units Intravenous Given 10/27/24 1110)    And  dextrose  50 % solution 50 mL (50 mLs Intravenous Given 10/27/24 1111)  sodium zirconium cyclosilicate  (LOKELMA ) packet 10 g (10 g Oral Given 10/27/24 1142)  calcium  gluconate 1 g/ 50 mL sodium chloride  IVPB (1,000 mg Intravenous New Bag/Given 10/27/24 1129)  sodium chloride  0.9 % bolus 2,000 mL (2,000 mLs Intravenous New Bag/Given 10/27/24 1131)     IMPRESSION / MDM / ASSESSMENT AND PLAN / ED COURSE  I reviewed the triage vital signs and the nursing notes.  Differential diagnosis includes, but is not limited to, acute GI bleed including variceal bleed, hemorrhagic shock, cardiogenic shock, lower suspicion  infection  Patient's presentation is most consistent with acute presentation with potential threat to life or bodily function.  68 year old female presenting to the emergency department for evaluation of ostomy bleeding.  No active bleeding noted in ostomy output.  Is hypotensive here with blood pressures of 70s over 40s but mentating appropriately.  I do note that patient had antibodies on prior type and screen.  We did discuss risk and benefits of emergency blood transfusion.  Given her intact mental status, will start with IV fluids while awaiting labs, ordered to cross and hold 2 units PRBCs.  Clinical Course as of 10/27/24 1248  Sun Oct 27, 2024  9082 Case discussed with Dr. Aundria. Does not feel CTA indicated without evidence of active bleeding currently. Will update when more diagnostics available.  [NR]  1039 D/w Dr. Marcelino with nephrology: Will evaluate at bedside, but suspects mixed volume depletion with hemorrhagic and nonhemorrhagic losses.  Recommends Veltassa, maintenance IV fluids with 100 cc of normal saline per hour.  Patient without improvement in hypotension after 1 L of IV fluids.  Mental status remains intact, so we will hold off on emergent blood but ordered to transfuse 1 unit of PRBCs. [NR]  1051 Received update from Dr. Marcelino.  With her low bicarb recommends bicarb drip at 125 an hour in sterile water , discontinuing normal saline maintenance fluids.  No current plans for HD. [NR]  1128 Case discussed with Dr. Isaiah. Recommends 2 L of additional IV fluids, agrees with blood transfusion, will evaluate for anticipated admission. [NR]  1244 Discussed with [place] with blood bank team.  Due to presence of antibodies, delayed availability of blood products.  She notes that if patient does not have any new antibodies from recent admission, we have 1 unit available here that could potentially be delivered within 1 to 2 hours.  However if patient has any new antibodies this may not be  appropriate for her.  Other units would likely require 3 to 4 hours as they will need to come in from [place].  She has requested 4 units be crossed and available for the patient. [NR]  Clinical Course User Index [NR] Levander Slate, MD     FINAL CLINICAL IMPRESSION(S) / ED DIAGNOSES   Final diagnoses:  Acute GI bleeding  Shock (HCC)  Acute renal failure, unspecified acute renal failure type     Rx / DC Orders   ED Discharge Orders     None        Note:  This document was prepared using Dragon voice recognition software and may include unintentional dictation errors.   Levander Slate, MD 10/27/24 [phone]

## 2024-10-27 NOTE — Sepsis Progress Note (Signed)
 Elink following code sepsis

## 2024-10-27 NOTE — ED Triage Notes (Signed)
 Pt to ED via [facility] from home for not feeling well. Per EMS pt c/o bright red blood coming out of her ostomy bag. Pt was also found to be hypotensive on EMS arrival, last Bp 70/40. Pt reports that this happened 1 month ago as well and she had to get several blood transfusions. Pt has hx/o A. Fib but denies use of blood thinners. Pt BP during triage 71/48.

## 2024-10-28 ENCOUNTER — Inpatient Hospital Stay

## 2024-10-28 ENCOUNTER — Ambulatory Visit: Admitting: Oncology

## 2024-10-28 ENCOUNTER — Other Ambulatory Visit

## 2024-10-28 DIAGNOSIS — I5023 Acute on chronic systolic (congestive) heart failure: Secondary | ICD-10-CM

## 2024-10-28 DIAGNOSIS — R578 Other shock: Secondary | ICD-10-CM | POA: Diagnosis not present

## 2024-10-28 LAB — CBC
HCT: 21 % — ABNORMAL LOW (ref 36.0–46.0)
Hemoglobin: 7 g/dL — ABNORMAL LOW (ref 12.0–15.0)
MCH: 33.5 pg (ref 26.0–34.0)
MCHC: 33.3 g/dL (ref 30.0–36.0)
MCV: 100.5 fL — ABNORMAL HIGH (ref 80.0–100.0)
Platelets: 154 K/uL (ref 150–400)
RBC: 2.09 MIL/uL — ABNORMAL LOW (ref 3.87–5.11)
RDW: 25 % — ABNORMAL HIGH (ref 11.5–15.5)
WBC: 9.2 K/uL (ref 4.0–10.5)
nRBC: 2.6 % — ABNORMAL HIGH (ref 0.0–0.2)

## 2024-10-28 LAB — HEPATIC FUNCTION PANEL
ALT: 47 U/L — ABNORMAL HIGH (ref 0–44)
AST: 72 U/L — ABNORMAL HIGH (ref 15–41)
Albumin: 3.6 g/dL (ref 3.5–5.0)
Alkaline Phosphatase: 107 U/L (ref 38–126)
Bilirubin, Direct: 0.9 mg/dL — ABNORMAL HIGH (ref 0.0–0.2)
Indirect Bilirubin: 0.6 mg/dL (ref 0.3–0.9)
Total Bilirubin: 1.5 mg/dL — ABNORMAL HIGH (ref 0.0–1.2)
Total Protein: 5.8 g/dL — ABNORMAL LOW (ref 6.5–8.1)

## 2024-10-28 LAB — HEMOGLOBIN AND HEMATOCRIT, BLOOD
HCT: 21.9 % — ABNORMAL LOW (ref 36.0–46.0)
HCT: 20.5 % — ABNORMAL LOW (ref 36.0–46.0)
HCT: 25 % — ABNORMAL LOW (ref 36.0–46.0)
HCT: 22.5 % — ABNORMAL LOW (ref 36.0–46.0)
Hemoglobin: 7.1 g/dL — ABNORMAL LOW (ref 12.0–15.0)
Hemoglobin: 7 g/dL — ABNORMAL LOW (ref 12.0–15.0)
Hemoglobin: 8.5 g/dL — ABNORMAL LOW (ref 12.0–15.0)
Hemoglobin: 7.8 g/dL — ABNORMAL LOW (ref 12.0–15.0)

## 2024-10-28 LAB — PROTIME-INR
INR: 1.4 — ABNORMAL HIGH (ref 0.8–1.2)
Prothrombin Time: 18 s — ABNORMAL HIGH (ref 11.4–15.2)

## 2024-10-28 LAB — RENAL FUNCTION PANEL
Albumin: 3.6 g/dL (ref 3.5–5.0)
Albumin: 3.4 g/dL — ABNORMAL LOW (ref 3.5–5.0)
Anion gap: 23 — ABNORMAL HIGH (ref 5–15)
Anion gap: 26 — ABNORMAL HIGH (ref 5–15)
BUN: 68 mg/dL — ABNORMAL HIGH (ref 8–23)
BUN: 64 mg/dL — ABNORMAL HIGH (ref 8–23)
CO2: 12 mmol/L — ABNORMAL LOW (ref 22–32)
CO2: 13 mmol/L — ABNORMAL LOW (ref 22–32)
Calcium: 7.8 mg/dL — ABNORMAL LOW (ref 8.9–10.3)
Calcium: 7.3 mg/dL — ABNORMAL LOW (ref 8.9–10.3)
Chloride: 93 mmol/L — ABNORMAL LOW (ref 98–111)
Chloride: 90 mmol/L — ABNORMAL LOW (ref 98–111)
Creatinine, Ser: 7.25 mg/dL — ABNORMAL HIGH (ref 0.44–1.00)
Creatinine, Ser: 6.25 mg/dL — ABNORMAL HIGH (ref 0.44–1.00)
GFR, Estimated: 6 mL/min — ABNORMAL LOW (ref >60)
GFR, Estimated: 7 mL/min — ABNORMAL LOW (ref >60)
Glucose, Bld: 130 mg/dL — ABNORMAL HIGH (ref 70–99)
Glucose, Bld: 205 mg/dL — ABNORMAL HIGH (ref 70–99)
Phosphorus: 5.1 mg/dL — ABNORMAL HIGH (ref 2.5–4.6)
Phosphorus: 4.8 mg/dL — ABNORMAL HIGH (ref 2.5–4.6)
Potassium: 5.2 mmol/L — ABNORMAL HIGH (ref 3.5–5.1)
Potassium: 4.5 mmol/L (ref 3.5–5.1)
Sodium: 127 mmol/L — ABNORMAL LOW (ref 135–145)
Sodium: 128 mmol/L — ABNORMAL LOW (ref 135–145)

## 2024-10-28 LAB — GLUCOSE, CAPILLARY
Glucose-Capillary: 119 mg/dL — ABNORMAL HIGH (ref 70–99)
Glucose-Capillary: 123 mg/dL — ABNORMAL HIGH (ref 70–99)
Glucose-Capillary: 142 mg/dL — ABNORMAL HIGH (ref 70–99)
Glucose-Capillary: 186 mg/dL — ABNORMAL HIGH (ref 70–99)
Glucose-Capillary: 194 mg/dL — ABNORMAL HIGH (ref 70–99)
Glucose-Capillary: 213 mg/dL — ABNORMAL HIGH (ref 70–99)
Glucose-Capillary: 236 mg/dL — ABNORMAL HIGH (ref 70–99)

## 2024-10-28 LAB — PREPARE RBC (CROSSMATCH)

## 2024-10-28 LAB — MAGNESIUM: Magnesium: 1.5 mg/dL — ABNORMAL LOW (ref 1.7–2.4)

## 2024-10-28 LAB — LACTIC ACID, PLASMA
Lactic Acid, Venous: 4 mmol/L (ref 0.5–1.9)
Lactic Acid, Venous: 3.8 mmol/L (ref 0.5–1.9)
Lactic Acid, Venous: 4.1 mmol/L (ref 0.5–1.9)
Lactic Acid, Venous: 4.6 mmol/L (ref 0.5–1.9)
Lactic Acid, Venous: 4.4 mmol/L (ref 0.5–1.9)

## 2024-10-28 LAB — LIPASE, BLOOD: Lipase: 230 U/L — ABNORMAL HIGH (ref 11–51)

## 2024-10-28 LAB — PROCALCITONIN: Procalcitonin: 0.34 ng/mL

## 2024-10-28 LAB — HEPATITIS B SURFACE ANTIGEN: Hepatitis B Surface Ag: NONREACTIVE

## 2024-10-28 MED ORDER — HEPARIN SODIUM (PORCINE) 1000 UNIT/ML DIALYSIS
1000.0000 [IU] | INTRAMUSCULAR | Status: DC | PRN
  Administered 2024-10-30 (×3): 1400 [IU] via INTRAVENOUS_CENTRAL
  Filled 2024-10-28 (×2): qty 3

## 2024-10-28 MED ORDER — SODIUM CHLORIDE 0.9% IV SOLUTION: Freq: Once | INTRAVENOUS | Status: AC

## 2024-10-28 MED ORDER — INSULIN ASPART 100 UNIT/ML IJ SOLN
0.0000 [IU] | Freq: Every day | INTRAMUSCULAR | Status: DC
  Administered 2024-10-28 – 2024-11-01 (×3): 2 [IU] via SUBCUTANEOUS
  Administered 2024-11-02: 3 [IU] via SUBCUTANEOUS
  Administered 2024-11-03: 2 [IU] via SUBCUTANEOUS
  Filled 2024-10-28 (×4): qty 2
  Filled 2024-10-28: qty 3

## 2024-10-28 MED ORDER — MAGNESIUM SULFATE 4 GM/100ML IV SOLN
4.0000 g | Freq: Once | INTRAVENOUS | Status: AC
  Administered 2024-10-28: 4 g via INTRAVENOUS
  Filled 2024-10-28: qty 100

## 2024-10-28 MED ORDER — PRISMASOL BGK 2/3.5 32-2-3.5 MEQ/L EC SOLN: Status: DC

## 2024-10-28 MED ORDER — BOOST / RESOURCE BREEZE PO LIQD CUSTOM
1.0000 | Freq: Three times a day (TID) | ORAL | Status: DC
  Administered 2024-10-29: 1 via ORAL

## 2024-10-28 MED ORDER — RENA-VITE PO TABS
1.0000 | ORAL_TABLET | Freq: Every day | ORAL | Status: DC
  Administered 2024-10-28 – 2024-11-05 (×9): 1 via ORAL
  Filled 2024-10-28 (×9): qty 1

## 2024-10-28 MED ORDER — INSULIN ASPART 100 UNIT/ML IJ SOLN
0.0000 [IU] | Freq: Three times a day (TID) | INTRAMUSCULAR | Status: DC
  Administered 2024-10-29: 3 [IU] via SUBCUTANEOUS
  Administered 2024-10-29: 1 [IU] via SUBCUTANEOUS
  Administered 2024-10-29: 3 [IU] via SUBCUTANEOUS
  Administered 2024-10-30: 2 [IU] via SUBCUTANEOUS
  Administered 2024-10-30: 5 [IU] via SUBCUTANEOUS
  Administered 2024-10-30 – 2024-10-31 (×2): 3 [IU] via SUBCUTANEOUS
  Administered 2024-10-31 (×2): 2 [IU] via SUBCUTANEOUS
  Administered 2024-11-01 (×2): 1 [IU] via SUBCUTANEOUS
  Administered 2024-11-01 – 2024-11-02 (×3): 3 [IU] via SUBCUTANEOUS
  Filled 2024-10-28: qty 1
  Filled 2024-10-28 (×2): qty 3
  Filled 2024-10-28 (×4): qty 2
  Filled 2024-10-28 (×2): qty 3
  Filled 2024-10-28 (×2): qty 5
  Filled 2024-10-28: qty 3
  Filled 2024-10-28: qty 2
  Filled 2024-10-28: qty 1

## 2024-10-28 MED ORDER — THIAMINE HCL 100 MG PO TABS
100.0000 mg | ORAL_TABLET | Freq: Every day | ORAL | Status: AC
  Administered 2024-10-29 – 2024-11-03 (×6): 100 mg via ORAL
  Filled 2024-10-28 (×12): qty 1

## 2024-10-28 NOTE — Progress Notes (Addendum)
 Patient found to be positive for MRSA in the nares. Standing order for Mupirocin  ointment entered; CHG bath order previously entered. Patient education provided and patient verbalized understanding.

## 2024-10-28 NOTE — Progress Notes (Signed)
 NAME:  Tammy Arias, MRN:  [id number], DOB:  1956-07-10, LOS: 1 ADMISSION DATE:  10/27/2024, CONSULTATION DATE: 10/27/2024 REFERRING MD: Dr. Levander, CHIEF COMPLAINT: Blood in Stools   History of Present Illness:  68 y.o.female with medical problems of heart failure with reduced ejection fraction 25 to 30%, severe mitral regurgitation, moderate right ventricular systolic dysfunction, severe tricuspid regurgitation, atrial fibrillation, failed Watchman device, chronic anticoagulation with apixaban , GI bleed, coronary disease status post CABG in 2005, aortic stenosis repair, pulmonary hypertension, type 2 diabetes, hypertension, liver cirrhosis with grade 2 esophageal varices, C. 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      She reports to ED for generalized malaise and is being admitted for hypotension +GIB from ileostomy   She states she was in her normal state of health on Thanksgiving and was able to eat well. Started feeling bad on Friday. Was unable to eat on Saturday. States she has had a lot of bloody drainage from ileostomy.  had progressive weakness since yesterday.    Labs on ED arrival concerning for sodium 124, potassium 6.1, S bicarb 9, BUN 73, creatinine 7.72 with GFR 5, and Hgb 6.2. 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 Hypertension CKD stage 3b GI bleeding Severe Mitral Regurgitation Non-rheumatic tricuspid valve regurgitation Pulmonary Hypertension Gastric AVM Pancytopenia (followed by Heme/onc)  Micro Data:   11/30: MRSA PCR>>positive  11/30: Blood x2>>NGTD   11/30: COVID>>negative  11/30: Influenza A&B>>negative  11/30: RSV>>negative   Anti-infectives (From admission, onward)    Start     Dose/Rate Route Frequency Ordered Stop   10/27/24 1130  ceFEPIme  (MAXIPIME ) 2 g in sodium chloride  0.9 % 100 mL IVPB        2 g 200 mL/hr over 30 Minutes Intravenous  Once 10/27/24 1127 10/27/24 1257   10/27/24 1130  metroNIDAZOLE  (FLAGYL ) IVPB 500 mg        500 mg 100 mL/hr over 60 Minutes Intravenous  Once 10/27/24 1127 10/27/24 1656   10/27/24 0930  cefTRIAXone  (ROCEPHIN ) 1 g in sodium chloride  0.9 % 100 mL IVPB        1 g 200 mL/hr over 30 Minutes Intravenous  Once 10/27/24 0917 10/27/24 0959       Significant Hospital Events: Including procedures, antibiotic start and stop dates in addition to other pertinent events   11/30: Admitted for hemorraghic shock, given 3-4 Liters of fluid, baseline SBP 90's  12/01: Pt requiring levophed  gtt @13  mcg/min to maintain map >65.  Nephrology ordered CRRT due to worsening renal function pending temporary dialysis catheter placement. Pending CT Abd Pelvis   Interim History / Subjective:  Pt reports nausea with difficulty tolerating po's   Objective    Blood pressure (!) 100/52, pulse 74, temperature 98.5 F (36.9 C), temperature source Oral, resp. rate 17, height 5' 1 (1.549 m), weight 61.4 kg, SpO2 100%.        Intake/Output Summary (Last 24 hours) at 10/28/2024 0909 Last data filed at 10/28/2024 0850 Gross per 24 hour  Intake 4806.12 ml  Output 0 ml  Net 4806.12 ml   Filed Weights   10/27/24  9142 10/27/24 1336 10/28/24 0441  Weight: 53.4 kg 58.2 kg 61.4 kg   Examination: General: Acutely-ill appearing female, NAD on RA  HENT: Supple, no JVD  Lungs: Clear throughout, even, non labored  Cardiovascular: NSR, s1s2, no m/r/g, 2+ radial/2+ distal pulses, no edema Abdomen: +BS x4, obese, soft, non tender, non distended  Extremities: Normal bulk and tone, moves all extremities  Neuro: Alert and  oriented, following commands, PERRLA  GU: Anuric   Resolved problem list   Assessment and Plan   #Acute gastrointestinal bleed secondary to unknown etiology #Circulatory shock s/t ABLA #Chronic pancytopenia #Acute on chronic anemia  Hx: liver cirrhosis with esophageal varices, gastic AVM, and portal hypertension - CBC and coags daily  - H&H q6hrs  - Transfuse for Hgb <8 due to cardiac Hx and ongoing bleeding; pending transfusion 1 unit pRBC's  - PPI BID - Octreotide  gtt  - Prn levophed  gtt to maintain map >65 - If ok with GI will start clear liquid diet  - GI consulted appreciate input    #Chronic HFrEF without exacerbation #CAD s/p CABG #Circulatory shock: hemorrhagic and possible cardiogenic shock  #HLD #PAF s/p watchman device #Possible cardiorenal syndrome   Hx: HTN, pulmonary HTN, and mitral/tricuspid regurgitation - Continuous telemetry monitoring  - Troponin pending  - Hold outpatient GDMT for now    #OSA on CPAP Hx: Former smoker - CPAP at bedtime - Supplemental O2 PRN to maintain SpO2 > 90%   #Acute kidney injury superimposed on CKD stage 3b suspect pre-renal secondary to hemorrhagic shock #Hypomagnesia  - Trend BMP  - Replace electrolytes as indicated  - Strict I&O's  - Avoid nephrotoxic agents as able  - Nephrology consulted appreciate input: CRRT per recommendations   #Elevated lipase  #Transaminitis  - Trend hepatic function panel and lipase  - Vitamin K level pending  - CT Abd Pelvis pending   #Type 2 diabetes mellitus - CBG's q4hrs  - Follow hypo/hyperglycemic protocol    #Hypothyroidism - Will resume outpatient synthroid  once able to tolerate po's    Labs   CBC: Recent Labs  Lab 10/27/24 0908 10/28/24 0156 10/28/24 0431 10/28/24 0746  WBC 7.5  --  9.2  --   HGB 6.2* 7.1* 7.0* 7.0*  HCT 19.7* 21.9* 21.0* 20.5*  MCV 115.9*  --  100.5*  --   PLT 167  --  154  --     Basic Metabolic Panel: Recent Labs  Lab 10/27/24 0908  10/27/24 1640 10/28/24 0431  NA 124* 127* 127*  K 6.1* 5.4* 5.2*  CL 96* 100 93*  CO2 9* 10* 12*  GLUCOSE 142* 129* 130*  BUN 73* 66* 68*  CREATININE 7.72* 7.07* 7.25*  CALCIUM  8.9 7.7* 7.8*  MG  --  1.7 1.5*  PHOS  --   --  5.1*   GFR: Estimated Creatinine Clearance: 6.2 mL/min (A) (by C-G formula based on SCr of 7.25 mg/dL (H)). Recent Labs  Lab 10/27/24 0908 10/27/24 1218 10/27/24 1342 10/28/24 0156 10/28/24 0431  WBC 7.5  --   --   --  9.2  LATICACIDVEN  --  5.1* 6.0* 4.0*  --     Liver Function Tests: Recent Labs  Lab 10/27/24 0908 10/28/24 0431 10/28/24 0746  AST 40  --  72*  ALT 26  --  47*  ALKPHOS 120  --  107  BILITOT 0.7  --  1.5*  PROT 6.4*  --  5.8*  ALBUMIN  4.0 3.6 3.6   Recent  Labs  Lab 10/28/24 0746  LIPASE 230*   No results for input(s): AMMONIA in the last 168 hours.  ABG    Component Value Date/Time   PHART 7.47 (H) 03/01/2024 1055   PCO2ART 40 03/01/2024 1055   PO2ART 112 (H) 03/01/2024 1055   HCO3 29.1 (H) 03/01/2024 1055   ACIDBASEDEF 2.7 (H) 02/29/2024 0952   O2SAT 67.3 08/22/2024 0500     Coagulation Profile: Recent Labs  Lab 10/27/24 0908  INR 1.3*    Cardiac Enzymes: No results for input(s): CKTOTAL, CKMB, CKMBINDEX, TROPONINI in the last 168 hours.  HbA1C: Hgb A1c MFr Bld  Date/Time Value Ref Range Status  02/28/2024 06:08 AM 5.9 (H) 4.8 - 5.6 % Final    Comment:    (NOTE) Pre diabetes:          5.7%-6.4%  Diabetes:              >6.4%  Glycemic control for   <7.0% adults with diabetes   06/07/2023 02:58 AM 6.3 (H) 4.8 - 5.6 % Final    Comment:    (NOTE)         Prediabetes: 5.7 - 6.4         Diabetes: >6.4         Glycemic control for adults with diabetes: <7.0     CBG: Recent Labs  Lab 10/27/24 1622 10/27/24 1937 10/27/24 2314 10/28/24 0315 10/28/24 0731  GLUCAP 124* 120* 94 123* 119*    Review of Systems: Positives in BOLD   Gen: Denies fever, chills, weight change, fatigue,  night sweats HEENT: Denies blurred vision, double vision, hearing loss, tinnitus, sinus congestion, rhinorrhea, sore throat, neck stiffness, dysphagia PULM: Denies shortness of breath, cough, sputum production, hemoptysis, wheezing CV: Denies chest pain, edema, orthopnea, paroxysmal nocturnal dyspnea, palpitations GI: abdominal pain, nausea, vomiting, diarrhea, hematochezia, melena, constipation, change in bowel habits GU: Denies dysuria, hematuria, polyuria, oliguria, urethral discharge Endocrine: Denies hot or cold intolerance, polyuria, polyphagia or appetite change Derm: Denies rash, dry skin, scaling or peeling skin change Heme: Denies easy bruising, bleeding, bleeding gums Neuro: headache, numbness, weakness, slurred speech, loss of memory or consciousness   Past Medical History:  She,  has a past medical history of AC (acromioclavicular) joint bone spurs, right, Anemia, Arthritis, Atrial fibrillation (HCC), Cardiac murmur, CHF (congestive heart failure) (HCC), Chronic anticoagulation, Clostridium difficile colitis (02/28/2024), Cor triatriatum, Coronary artery disease, Cortical cataract, DOE (dyspnea on exertion), DOE (dyspnea on exertion), GERD (gastroesophageal reflux disease), HFrEF (heart failure with reduced ejection fraction) (HCC), History of shingles (2004), Hyperlipidemia, Hypertension, Hypothyroidism, IBS (irritable bowel syndrome), Ischemic cardiomyopathy, Lower GI bleed (06/07/2023), Lumbar scoliosis, Lumbar spinal stenosis, Melena (12/17/2018), Migraines, Osteoporosis, Pancolitis (02/28/2024), Pulmonary HTN (HCC), S/P CABG x 3 (01/28/2004), Sleep apnea, T2DM (type 2 diabetes mellitus) (HCC), TIA (transient ischemic attack) (05/29/2016), and Vitamin B 12 deficiency.   Surgical History:   Past Surgical History:  Procedure Laterality Date   CARDIAC CATHETERIZATION     CATARACT EXTRACTION W/ INTRAOCULAR LENS IMPLANT Bilateral    Cataract Extraction with IOL   COLECTOMY   02/28/2024   Procedure: COLECTOMY, TOTAL;  Surgeon: Lane Shope, MD;  Location: [facility] ORS;  Service: General;;   COLONOSCOPY N/A 12/19/2018   Procedure: COLONOSCOPY;  Surgeon: Janalyn Keene NOVAK, MD;  Location: [facility] ENDOSCOPY;  Service: Endoscopy;  Laterality: N/A;   COLONOSCOPY WITH PROPOFOL  N/A 06/17/2020   Procedure: COLONOSCOPY WITH PROPOFOL ;  Surgeon: Janalyn Keene NOVAK, MD;  Location: [facility] ENDOSCOPY;  Service: Endoscopy;  Laterality: N/A;   COLONOSCOPY WITH PROPOFOL  N/A 03/30/2023   Procedure: COLONOSCOPY WITH PROPOFOL ;  Surgeon: Onita Elspeth Sharper, DO;  Location: [facility] ENDOSCOPY;  Service: Endoscopy;  Laterality: N/A;   COR TRIATRIATUM REPAIR N/A 01/28/2004   CORONARY ARTERY BYPASS GRAFT N/A 01/28/2004   3v CABG   ESOPHAGOGASTRODUODENOSCOPY N/A 12/19/2018   Procedure: ESOPHAGOGASTRODUODENOSCOPY (EGD);  Surgeon: Janalyn Keene NOVAK, MD;  Location: [facility] ENDOSCOPY;  Service: Endoscopy;  Laterality: N/A;   ESOPHAGOGASTRODUODENOSCOPY N/A 06/11/2024   Procedure: EGD (ESOPHAGOGASTRODUODENOSCOPY);  Surgeon: Jinny Carmine, MD;  Location: [facility] ENDOSCOPY;  Service: Endoscopy;  Laterality: N/A;   ESOPHAGOGASTRODUODENOSCOPY N/A 07/18/2024   Procedure: EGD (ESOPHAGOGASTRODUODENOSCOPY);  Surgeon: Onita Elspeth Sharper, DO;  Location: [facility] ENDOSCOPY;  Service: Gastroenterology;  Laterality: N/A;  with Enteroscopy        DM on Plavix   ESOPHAGOGASTRODUODENOSCOPY (EGD) WITH PROPOFOL  N/A 06/17/2020   Procedure: ESOPHAGOGASTRODUODENOSCOPY (EGD) WITH PROPOFOL ;  Surgeon: Janalyn Keene NOVAK, MD;  Location: [facility] ENDOSCOPY;  Service: Endoscopy;  Laterality: N/A;   ESOPHAGOGASTRODUODENOSCOPY (EGD) WITH PROPOFOL  N/A 03/30/2023   Procedure: ESOPHAGOGASTRODUODENOSCOPY (EGD) WITH PROPOFOL ;  Surgeon: Onita Elspeth Sharper, DO;  Location: [facility] ENDOSCOPY;  Service: Endoscopy;  Laterality: N/A;   GIVENS CAPSULE STUDY N/A 07/21/2024   Procedure: IMAGING PROCEDURE, GI TRACT, INTRALUMINAL, VIA CAPSULE;  Surgeon:  Therisa Bi, MD;  Location: [facility] ENDOSCOPY;  Service: Gastroenterology;  Laterality: N/A;   LAPAROTOMY N/A 02/28/2024   Procedure: LAPAROTOMY, EXPLORATORY;  Surgeon: Lane Shope, MD;  Location: [facility] ORS;  Service: General;  Laterality: N/A;   LEFT ATRIAL APPENDAGE OCCLUSION     RIGHT/LEFT HEART CATH AND CORONARY ANGIOGRAPHY Bilateral 03/29/2017   Procedure: Right/Left Heart Cath and Coronary Angiography;  Surgeon: Vinie DELENA Jude, MD;  Location: [facility] INVASIVE CV LAB;  Service: Cardiovascular;  Laterality: Bilateral;   TOTAL KNEE ARTHROPLASTY Right 04/05/2022   Procedure: TOTAL KNEE ARTHROPLASTY;  Surgeon: Kathlynn Sharper, MD;  Location: [facility] ORS;  Service: Orthopedics;  Laterality: Right;   TUBAL LIGATION     VENTRAL HERNIA REPAIR N/A 04/21/2017   Procedure: HERNIA REPAIR VENTRAL ADULT;  Surgeon: Claudene Larinda Bolder, MD;  Location: [facility] ORS;  Service: General;  Laterality: N/A;     Social History:   reports that she quit smoking about 25 years ago. Her smoking use included cigarettes. She has never used smokeless tobacco. She reports that she does not drink alcohol and does not use drugs.   Family History:  Her family history includes Breast cancer in her cousin; Cancer in her sister and sister; Diabetes in her father; Valvular heart disease in her mother.   Allergies Allergies  Allergen Reactions   Vioxx [Rofecoxib] Swelling     Home Medications  Prior to Admission medications   Medication Sig Start Date End Date Taking? Authorizing Provider  albuterol  (PROVENTIL  HFA;VENTOLIN  HFA) 108 (90 Base) MCG/ACT inhaler Inhale 2 puffs into the lungs every 6 (six) hours as needed for wheezing or shortness of breath.   Yes [provider]  allopurinol  (ZYLOPRIM ) 300 MG tablet Take 300 mg by mouth daily.   Yes [provider]  cyanocobalamin  (VITAMIN B12) 1000 MCG tablet Take 1,000 mcg by mouth daily.   Yes [provider]  empagliflozin  (JARDIANCE ) 10 MG TABS tablet  Take 1 tablet by mouth daily. 04/17/24  Yes [provider]  fluticasone  (FLONASE ) 50 MCG/ACT nasal spray Place 2 sprays into the nose daily. 05/20/24  Yes [provider]  iron  polysaccharides (NIFEREX) 150 MG capsule Take 1 capsule (150 mg total) by mouth  daily. 08/22/24 11/20/24 Yes Maree Hue, MD  latanoprost  (XALATAN ) 0.005 % ophthalmic solution Place 1 drop into both eyes at bedtime.   Yes [provider]  levothyroxine  (SYNTHROID ) 50 MCG tablet Take 50 mcg by mouth daily before breakfast.   Yes [provider]  magnesium  oxide (MAG-OX) 400 MG tablet Take 400 mg by mouth. Take 1 tablet (400 mg total) by mouth once daily 05/20/24 05/20/25 Yes [provider]  metFORMIN  (GLUCOPHAGE ) 1000 MG tablet Take 1,000 mg by mouth 2 (two) times daily with a meal.   Yes [provider]  nadolol (CORGARD) 20 MG tablet Take 20 mg by mouth daily. 08/12/24 08/12/25 Yes [provider]  pantoprazole  (PROTONIX ) 40 MG tablet Take 40 mg by mouth daily. 01/28/21  Yes [provider]  rosuvastatin  (CRESTOR ) 5 MG tablet Take 5 mg by mouth daily. 02/20/24 02/19/25 Yes [provider]  spironolactone  (ALDACTONE ) 25 MG tablet Take 0.5 tablets (12.5 mg total) by mouth daily. 03/31/24  Yes Fausto Sor A, DO  torsemide  (DEMADEX ) 20 MG tablet Take 1 tablet (20 mg total) by mouth daily as needed (for weight gain or swelling). 03/31/24  Yes Fausto Sor A, DO  aspirin  EC 81 MG tablet Take 81 mg by mouth daily. Swallow whole.    [provider]  Blood Glucose Monitoring Suppl (ACCU-CHEK GUIDE ME) w/Device KIT  08/01/24   [provider]  carvedilol  (COREG ) 3.125 MG tablet Take 3.125 mg by mouth 2 (two) times daily with a meal.    [provider]  phytonadione  (VITAMIN K) 5 MG tablet Take 0.5 tablets (2.5 mg total) by mouth daily for 5 days Patient not taking: Reported on 10/27/2024 08/22/24   Maree Hue, MD     Critical care  time: 45 minutes       Lonell Moose, AGNP  Pulmonary/Critical Care Pager [phone] (please enter 7 digits) PCCM Consult Pager [phone] (please enter 7 digits)

## 2024-10-28 NOTE — TOC Initial Note (Signed)
 Transition of Care [facility]) - Initial/Assessment Note    Patient Details  Name: Tammy Arias MRN: [id number] Date of Birth: Jul 08, 1956  [facility]) CM/SW Contact:    Corrie JINNY Ruts, LCSW Phone Number: 10/28/2024, 3:02 PM  Clinical Narrative:                 Chart reviewed. I was able to speak with the patient at bedside today. I introduced myself, my role, and reason for consult. The patient confirms that she has PCP. The patient reports that she lives in the home by herself.   The patient reports that she can complete task independently and drives her self to medical appointments. The patient reports that her son lives in [place]  but she will have someone help her during D/C. The patient reports that she uses [facility].   The patient reports that she has had HH before but didn't need it when it was time. The patient did report that she would be interest in [facility] services if it was recommended. The patient reports that she has never been admitted into a SNF and declined recommend if given.   The patient reports that she has a cane, walker, and shower seat in the home. The patient has no concerns or questions during the assessment.  TOC will follow unitl D/C.     Barriers to Discharge: Continued Medical Work up   Patient Goals and CMS Choice            Expected Discharge Plan and Services       Living arrangements for the past 2 months: Single Family Home                                      Prior Living Arrangements/Services Living arrangements for the past 2 months: Single Family Home Lives with:: Self Patient language and need for interpreter reviewed:: Yes        Need for Family Participation in Patient Care: Yes (Comment)     Criminal Activity/Legal Involvement Pertinent to Current Situation/Hospitalization: No - Comment as needed  Activities of Daily Living   ADL Screening (condition at time of admission) Independently performs  ADLs?: Yes (appropriate for developmental age)  Permission Sought/Granted                  Emotional Assessment Appearance:: Appears stated age Attitude/Demeanor/Rapport: Gracious Affect (typically observed): Calm Orientation: : Oriented to Self, Oriented to Place, Oriented to  Time, Oriented to Situation Alcohol / Substance Use: Not Applicable Psych Involvement: No (comment)  Admission diagnosis:  Hemorrhagic shock (HCC) [R57.8] Shock (HCC) [R57.9] Acute GI bleeding [K92.2] Acute renal failure, unspecified acute renal failure type [N17.9] Patient Active Problem List   Diagnosis Date Noted   Shock (HCC) 10/27/2024   Acute GI bleeding 10/27/2024   Malnutrition of moderate degree 08/16/2024   HFrEF (heart failure with reduced ejection fraction) (HCC) 08/15/2024   Severe mitral regurgitation 08/15/2024   Hemorrhagic shock (HCC) 08/14/2024   Bilateral inguinal hernia without obstruction or gangrene 07/22/2024   Deficient knowledge of ileostomy 07/22/2024   GI bleed 07/19/2024   Esophageal varices without bleeding (HCC) 06/11/2024   Blood in stool 06/10/2024   GI bleeding 06/07/2024   Acute blood loss anemia 06/07/2024   Irritant contact dermatitis associated with fecal stoma 04/14/2024   Ileostomy care (HCC) 04/14/2024   Dehydration 04/14/2024   Hypokalemia 04/06/2024  Ileostomy in place [facility]) 04/04/2024   Status post total colectomy 04/04/2024   Chronic metabolic acidosis 04/04/2024   Enteritis due to Norovirus 04/04/2024   Elevated LFTs 04/04/2024   Glaucoma 04/04/2024   GERD without esophagitis 03/29/2024   Type 2 diabetes mellitus with chronic kidney disease, without long-term current use of insulin  (HCC) 03/29/2024   Dyslipidemia 03/29/2024   Chronic systolic CHF (congestive heart failure) (HCC) - LVEF 35 to 40% 03/17/2024   HTN (hypertension) 03/17/2024   Hypothyroidism 03/17/2024   Gout 03/17/2024   Hyponatremia 03/17/2024   Hypomagnesemia 03/17/2024    Acute renal failure 03/17/2024   Chronic diastolic CHF (congestive heart failure) (HCC) 06/07/2023   Chronic anticoagulation - on Eliquis  for chronic afib 06/07/2023   Cirrhosis and Portal hypertension  on CT(HCC) 06/07/2023   Thrombocytopenia 06/06/2023   CAD with hx of CABG 06/06/2023   OSA on CPAP 05/23/2023   S/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, right 04/05/2022   S/P TKR (total knee replacement) 04/05/2022   Colon cancer screening    Iron  deficiency anemia    Gastric AVM    Barrett's esophagus without dysplasia    Angiodysplasia of intestinal tract    Internal hemorrhoids    Pulmonary hypertension (HCC) 03/29/2017   Coronary artery disease involving native coronary artery of native heart with unstable angina pectoris (HCC) 03/21/2017   Ischemic cardiomyopathy 03/21/2017   Atrial fibrillation, chronic (HCC) 10/08/2014   Hyperlipidemia 10/08/2014   Type 2 diabetes mellitus (HCC) 10/08/2014   PCP:  Valora Lynwood FALCON, MD Pharmacy:   [facility] [place] [place] - [address] [place] [place] [address] Phone: [phone] Fax: [phone]  [facility] [address] [place] [address] Phone: [phone] Fax: [phone]     Social Drivers of Health (SDOH) Social History: SDOH Screenings   Food Insecurity: No Food Insecurity (08/14/2024)  Housing: Low Risk  (08/14/2024)  Transportation Needs: No Transportation Needs (08/14/2024)  Utilities: Not At Risk (08/14/2024)  Depression (PHQ2-9): Low Risk  (09/04/2024)  Financial Resource Strain: Medium Risk (05/20/2024)   Received from [facility]  Social Connections: Moderately Integrated (08/14/2024)  Tobacco Use: Medium Risk (10/27/2024)   SDOH Interventions:     Readmission Risk Interventions    10/28/2024    3:01 PM 08/16/2024    1:36 PM 06/11/2024    4:23 PM  Readmission Risk Prevention Plan  Transportation Screening Complete  Complete Complete  Medication Review Oceanographer) Complete Complete Complete  PCP or Specialist appointment within 3-5 days of discharge Complete Complete   HRI or Home Care Consult   --  SW Recovery Care/Counseling Consult Complete Complete   Palliative Care Screening Not Applicable Not Applicable Not Applicable  Skilled Nursing Facility Not Applicable Not Applicable Not Applicable

## 2024-10-28 NOTE — Procedures (Signed)
 Central Venous Catheter Insertion Procedure Note  ALLANAH MCFARLAND  [id number]  03-24-1956  Date:10/28/24  Time:1:38 PM   Provider Performing:Amarise Lillo   Procedure: Insertion of Non-tunneled Central Venous Catheter(36556)with US  guidance (23062)    Indication(s) Hemodialysis  Consent Risks of the procedure as well as the alternatives and risks of each were explained to the patient and/or caregiver.  Consent for the procedure was obtained and is signed in the bedside chart  Anesthesia Topical only with 1% lidocaine    Timeout Verified patient identification, verified procedure, site/side was marked, verified correct patient position, special equipment/implants available, medications/allergies/relevant history reviewed, required imaging and test results available.  Sterile Technique Maximal sterile technique including full sterile barrier drape, hand hygiene, sterile gown, sterile gloves, mask, hair covering, sterile ultrasound probe cover (if used).  Procedure Description Area of catheter insertion was cleaned with chlorhexidine  and draped in sterile fashion.   With real-time ultrasound guidance a HD catheter was placed into the right femoral vein.  Nonpulsatile blood flow and easy flushing noted in all ports.  The catheter was sutured in place and sterile dressing applied.  Complications/Tolerance None; patient tolerated the procedure well. Chest X-ray is ordered to verify placement for internal jugular or subclavian cannulation.  Chest x-ray is not ordered for femoral cannulation.  EBL Minimal  Specimen(s) None   Robet Kim, PA-C [facility] PCCM Team Contact Info: [phone]

## 2024-10-28 NOTE — Consult Note (Signed)
 PHARMACY CONSULT NOTE  Pharmacy Consult for Electrolyte Monitoring and Replacement   Recent Labs: Potassium (mmol/L)  Date Value  10/28/2024 5.2 (H)   Magnesium  (mg/dL)  Date Value  87/98/7974 1.5 (L)   Calcium  (mg/dL)  Date Value  87/98/7974 7.8 (L)   Albumin  (g/dL)  Date Value  87/98/7974 3.6  05/18/2020 4.7   Phosphorus (mg/dL)  Date Value  87/98/7974 5.1 (H)   Sodium (mmol/L)  Date Value  10/28/2024 127 (L)   Assessment: 68 y/o female with h/o ischemic cardiomyopathy, gastric AVM, GI angiodysplasia, IBS-D, IDA, s/p watchman device, cholelithiasis, TIA, hypothyroidism, pulmonary hypertension, GERD, barrett's esophagus, HLD, HTN, OSA, CAD s/p CABG x 3, PAF, CHF, cirrhosis, portal hypertension, gout, CKD III, B12 deficiency, ventral hernia s/p mesh repair 2018, colonic angioectasias, diverticulosis, DDD, hiatal hernia, esophageal varices. Pharmacy is asked to follow and replace electrolytes while in CCU  MIVF: Sodium bicarbonate  150 mEq in sterile water  at 125 mL/hr  Goal of Therapy:  Electrolytes WNL  Plan:  --Na 127, Cl 93, stable and continue to monitor --K 5.2, Veltassa 25.2 g daily --Mg 1.5, magnesium  sulfate 4 g IV x 1 per [facility] team --Labs tomorrow  Tammy Arias 10/28/2024 7:56 AM

## 2024-10-28 NOTE — Plan of Care (Addendum)
 A&O patient admitted for hemorrhagic shock. Patient has been Afib and NSR this shift. Sodium Bicarb and octreotide  infusing per order. Levophed  titrated to keep MAP>65. Patient has not voided this shift, last bladder scan at 0315 showed 44 ml of urine in the bladder. Patient states that she does not feel the need to urinate at this time also.  Problem: Education: Goal: Knowledge of General Education information will improve Description: Including pain rating scale, medication(s)/side effects and non-pharmacologic comfort measures Outcome: Progressing   Problem: Clinical Measurements: Goal: Ability to maintain clinical measurements within normal limits will improve Outcome: Progressing Goal: Will remain free from infection Outcome: Progressing Goal: Cardiovascular complication will be avoided Outcome: Progressing   Problem: Pain Managment: Goal: General experience of comfort will improve and/or be controlled Outcome: Progressing   Problem: Safety: Goal: Ability to remain free from injury will improve Outcome: Progressing   Problem: Nutrition: Goal: Adequate nutrition will be maintained Outcome: Not Progressing

## 2024-10-28 NOTE — Plan of Care (Signed)
  Problem: Education: Goal: Knowledge of General Education information will improve Description: Including pain rating scale, medication(s)/side effects and non-pharmacologic comfort measures Outcome: Progressing   Problem: Clinical Measurements: Goal: Respiratory complications will improve Outcome: Progressing   Problem: Nutrition: Goal: Adequate nutrition will be maintained Outcome: Progressing   Problem: Coping: Goal: Level of anxiety will decrease Outcome: Progressing   Problem: Pain Managment: Goal: General experience of comfort will improve and/or be controlled Outcome: Progressing   Problem: Safety: Goal: Ability to remain free from injury will improve Outcome: Progressing   Problem: Skin Integrity: Goal: Risk for impaired skin integrity will decrease Outcome: Progressing

## 2024-10-28 NOTE — Progress Notes (Signed)
 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 She reports to ED for generalized malaise and is being admitted for Hemorrhagic shock (HCC) [R57.8] Shock (HCC) [R57.9] Acute GI bleeding [K92.2] Acute renal failure, unspecified acute renal failure type [N17.9]  Patient is known to our practice from previous admissions and has seen Dr Dennise in he office once.   Update: Patient seen and evaluated in ICU Alert and oriented Remains on room air Pressor-Levo IVF- Sodium bicarb at 125ml/hr Octreotide    No urine output Creatinine 7.25  Objective:  Vital signs in last 24 hours:  Temp:  [95.7 F (35.4 C)-99.6 F (37.6 C)] 98.1 F (36.7 C) (12/01 1050) Pulse Rate:  [40-139] 91 (12/01 1115) Resp:  [12-22] 15 (12/01 1115) BP: (63-117)/(26-70) 102/55 (12/01 1115) SpO2:  [95 %-100 %] 98 % (12/01 1115) Weight:  [58.2 kg-61.4 kg] 61.4 kg (12/01 0441)  Weight change:  Filed Weights   10/27/24 0857 10/27/24 1336 10/28/24 0441  Weight: 53.4 kg 58.2 kg 61.4 kg    Intake/Output: I/O last 3 completed shifts: In: 4508.7 [I.V.:2913.5; Blood:339; IV Piggyback:1256.2] Out: -    Intake/Output this shift:  Total I/O In: 756.1 [I.V.:432.2; Blood:315; IV Piggyback:8.8] Out: 50 [Stool:50]  Physical Exam: General: NAD  Head: Normocephalic, atraumatic. Dry oral mucosa  Eyes: Anicteric  Lungs:  Clear to auscultation,  normal effort  Heart: Regular rate and rhythm  Abdomen:  Soft, nontender. Ileostomy   Extremities:  No peripheral edema.  Neurologic: Awake, alert, conversant  Skin: Warm,dry, no rash  Access: None    Basic Metabolic Panel: Recent Labs  Lab 10/27/24 0908 10/27/24 1640 10/28/24 0431  NA 124* 127* 127*  K 6.1* 5.4* 5.2*  CL 96* 100 93*  CO2 9* 10* 12*  GLUCOSE 142* 129* 130*  BUN 73* 66* 68*  CREATININE 7.72* 7.07* 7.25*  CALCIUM  8.9 7.7* 7.8*  MG  --  1.7 1.5*  PHOS  --   --  5.1*    Liver Function Tests: Recent Labs  Lab 10/27/24 0908 10/28/24 0431 10/28/24 0746  AST 40  --  72*  ALT 26  --  47*  ALKPHOS 120  --  107  BILITOT 0.7  --  1.5*  PROT 6.4*  --  5.8*  ALBUMIN  4.0 3.6 3.6   Recent Labs  Lab 10/28/24 0746  LIPASE 230*   No results for input(s): AMMONIA in the last 168 hours.  CBC: Recent Labs  Lab 10/27/24 0908 10/28/24 0156 10/28/24 0431 10/28/24 0746  WBC 7.5  --  9.2  --   HGB 6.2* 7.1* 7.0* 7.0*  HCT 19.7* 21.9* 21.0* 20.5*  MCV 115.9*  --  100.5*  --   PLT 167  --  154  --     Cardiac Enzymes: No results for input(s): CKTOTAL, CKMB, CKMBINDEX, TROPONINI in the last 168 hours.  BNP: Invalid input(s): POCBNP  CBG: Recent Labs  Lab 10/27/24 1622 10/27/24 1937 10/27/24 2314 10/28/24 0315 10/28/24 0731  GLUCAP 124* 120* 94 123* 119*  Microbiology: Results for orders placed or performed during the hospital encounter of 10/27/24  Resp panel by RT-PCR (RSV, Flu A&B, Covid) Anterior Nasal Swab     Status: None   Collection Time: 10/27/24 11:01 AM   Specimen: Anterior Nasal Swab  Result Value Ref Range Status   SARS Coronavirus 2 by RT PCR NEGATIVE NEGATIVE Final    Comment: (NOTE) SARS-CoV-2 target nucleic acids are NOT DETECTED.  The SARS-CoV-2 RNA is generally detectable in upper respiratory specimens during the acute phase of infection. The lowest concentration of SARS-CoV-2 viral copies this assay can  detect is 138 copies/mL. A negative result does not preclude SARS-Cov-2 infection and should not be used as the sole basis for treatment or other patient management decisions. A negative result may occur with  improper specimen collection/handling, submission of specimen other than nasopharyngeal swab, presence of viral mutation(s) within the areas targeted by this assay, and inadequate number of viral copies(<138 copies/mL). A negative result must be combined with clinical observations, patient history, and epidemiological information. The expected result is Negative.  Fact Sheet for Patients:  bloggercourse.com  Fact Sheet for Healthcare Providers:  seriousbroker.it  This test is no t yet approved or cleared by the [place]  FDA and  has been authorized for detection and/or diagnosis of SARS-CoV-2 by FDA under an Emergency Use Authorization (EUA). This EUA will remain  in effect (meaning this test can be used) for the duration of the COVID-19 declaration under Section 564(b)(1) of the Act, 21 U.S.C.section 360bbb-3(b)(1), unless the authorization is terminated  or revoked sooner.       Influenza A by PCR NEGATIVE NEGATIVE Final   Influenza B by PCR NEGATIVE NEGATIVE Final    Comment: (NOTE) The Xpert Xpress SARS-CoV-2/FLU/RSV plus assay is intended as an aid in the diagnosis of influenza from Nasopharyngeal swab specimens and should not be used as a sole basis for treatment. Nasal washings and aspirates are unacceptable for Xpert Xpress SARS-CoV-2/FLU/RSV testing.  Fact Sheet for Patients: bloggercourse.com  Fact Sheet for Healthcare Providers: seriousbroker.it  This test is not yet approved or cleared by the [place]  FDA and has been authorized for detection and/or diagnosis of SARS-CoV-2 by FDA under an Emergency Use Authorization (EUA). This EUA will remain in  effect (meaning this test can be used) for the duration of the COVID-19 declaration under Section 564(b)(1) of the Act, 21 U.S.C. section 360bbb-3(b)(1), unless the authorization is terminated or revoked.     Resp Syncytial Virus by PCR NEGATIVE NEGATIVE Final    Comment: (NOTE) Fact Sheet for Patients: bloggercourse.com  Fact Sheet for Healthcare Providers: seriousbroker.it  This test is not yet approved or cleared by the [place]  FDA and has been authorized for detection and/or diagnosis of SARS-CoV-2 by FDA under an Emergency Use Authorization (EUA). This EUA will remain in effect (meaning this test can be used) for the duration of the COVID-19 declaration under Section 564(b)(1) of the Act, 21 U.S.C. section 360bbb-3(b)(1), unless the authorization is terminated or revoked.  Performed at [facility], [address] [place], [place] [address]   Blood Culture (routine x 2)     Status: None (Preliminary result)   Collection Time: 10/27/24 12:18 PM   Specimen: Right Antecubital; Blood  Result Value Ref Range Status   Specimen Description RIGHT ANTECUBITAL  Final   Special Requests   Final    BOTTLES DRAWN AEROBIC AND ANAEROBIC Blood Culture adequate volume   Culture   Final  NO GROWTH < 24 HOURS Performed at [facility], [address] [place], [place] [address]    Report Status PENDING  Incomplete  Blood Culture (routine x 2)     Status: None (Preliminary result)   Collection Time: 10/27/24 12:18 PM   Specimen: Left Antecubital; Blood  Result Value Ref Range Status   Specimen Description LEFT ANTECUBITAL  Final   Special Requests   Final    BOTTLES DRAWN AEROBIC AND ANAEROBIC Blood Culture results may not be optimal due to an inadequate volume of blood received in culture bottles   Culture   Final    NO GROWTH < 24 HOURS Performed at [facility], [address] [place],  [place] [address]    Report Status PENDING  Incomplete  MRSA Next Gen by PCR, Nasal     Status: Abnormal   Collection Time: 10/27/24  1:38 PM   Specimen: Nasal Mucosa; Nasal Swab  Result Value Ref Range Status   MRSA by PCR Next Gen DETECTED (A) NOT DETECTED Final    Comment: RESULT CALLED TO, READ BACK BY AND VERIFIED WITH: KATHERINE CLAYTON @1520  10/27/24 MJU (NOTE) The GeneXpert MRSA Assay (FDA approved for NASAL specimens only), is one component of a comprehensive MRSA colonization surveillance program. It is not intended to diagnose MRSA infection nor to guide or monitor treatment for MRSA infections. Test performance is not FDA approved in patients less than 77 years old. Performed at [facility], [address] [place], [place] [address]     Coagulation Studies: Recent Labs    10/27/24 0908  LABPROT 17.2*  INR 1.3*    Urinalysis: No results for input(s): COLORURINE, LABSPEC, PHURINE, GLUCOSEU, HGBUR, BILIRUBINUR, KETONESUR, PROTEINUR, UROBILINOGEN, NITRITE, LEUKOCYTESUR in the last 72 hours.  Invalid input(s): APPERANCEUR    Imaging: DG Chest Port 1 View Result Date: 10/27/2024 CLINICAL DATA:  Central line EXAM: PORTABLE CHEST 1 VIEW COMPARISON:  Chest x-ray 10/27/2024 FINDINGS: There is a new right-sided central venous catheter with distal tip in the proximal right atrium. The heart is enlarged. Patient is status post cardiac surgery. The lungs are clear. There is no pleural effusion or pneumothorax. No acute fractures are seen. IMPRESSION: New right-sided central venous catheter with distal tip in the proximal right atrium. No pneumothorax. Electronically Signed   By: Greig Pique M.D.   On: 10/27/2024 16:54   DG Chest Port 1 View Result Date: 10/27/2024 CLINICAL DATA:  Sepsis.  GI bleeding.  Hypotension. EXAM: PORTABLE CHEST 1 VIEW COMPARISON:  08/15/2024 FINDINGS: Stable mild cardiomegaly. Prior median sternotomy. Both lungs are clear.  IMPRESSION: Mild cardiomegaly. No active lung disease. Electronically Signed   By: Norleen DELENA Kil M.D.   On: 10/27/2024 11:49     Medications:    sodium chloride      sodium chloride      norepinephrine  (LEVOPHED ) Adult infusion 13 mcg/min (10/28/24 0932)   octreotide  (SANDOSTATIN ) 500 mcg in sodium chloride  0.9 % 250 mL (2 mcg/mL) infusion 50 mcg/hr (10/28/24 0932)   PrismaSol BGK 2/3.5     PrismaSol BGK 2/3.5     PrismaSol BGK 2/3.5     sodium bicarbonate  150 mEq in sterile water  1,150 mL infusion 125 mL/hr at 10/28/24 0932    sodium chloride    Intravenous Once   Chlorhexidine  Gluconate Cloth  6 each Topical Daily   mupirocin  ointment  1 Application Nasal BID   pantoprazole  (PROTONIX ) IV  40 mg Intravenous Q12H   patiromer  25.2 g Oral Daily  sodium chloride  flush  3 mL Intravenous Q12H   sodium chloride , acetaminophen , docusate sodium , heparin , ondansetron  (ZOFRAN ) IV, mouth rinse, polyethylene glycol, sodium chloride  flush, traZODone   Assessment/ Plan:  Ms. LIZABETH FELLNER is a 68 y.o.  female presents to ED with weakness and has been admitted for Hemorrhagic shock (HCC) [R57.8] Shock (HCC) [R57.9] Acute GI bleeding [K92.2] Acute renal failure, unspecified acute renal failure type [N17.9]   Acute Kidney Injury with hyperkalemia, with baseline creatinine 0.92 on 08/22/24.  Acute kidney injury appears multifactorial at this time from hypotension and dehydration. Potassium 6.1 on admission. Improved to 5.2 with Lokelma  and Veltassa.  Failed attempts to revive renal function with IVF. Discussed continued renal failure and lack of urine output with patient, patient agreeable to proceed with renal replacement therapy. Due to presence of pressor and labs, we feel CRRT would be appropriate for this patient. Orders placed and access requested from critical care team.  Will reassess in am.   Lab Results  Component Value Date   CREATININE 7.25 (H) 10/28/2024   CREATININE 7.07 (H) 10/27/2024    CREATININE 7.72 (H) 10/27/2024    Intake/Output Summary (Last 24 hours) at 10/28/2024 1126 Last data filed at 10/28/2024 1115 Gross per 24 hour  Intake 5264.73 ml  Output 50 ml  Net 5214.73 ml    2. Acute metabolic acidosis, S bicarb 9 on ED arrival. Spoke with EDP and coordinated sodium bicarb infusion at 125ml/hr. S bicarb slowly improving.   3. Anemia with suspected acute blood loss Lab Results  Component Value Date   HGB 7.0 (L) 10/28/2024  Hgb remains decreased. Received a blood transfusion yesterday. Octreotide  infusion ordered. GI consulted.   4. Hypotension, workup in progress. Suspected cardiogenic vs hemorrhagic shock. Blood pressure maintain with Levo   LOS: 1 Ladaysha Soutar 12/1/202511:26 AM

## 2024-10-28 NOTE — Progress Notes (Addendum)
 Patient ordered 1 unit pRBC's to be infused, per shift report patient has antibodies and blood to come from another location.   2047 - Unit W [phone] R issued from the blood bank  2059 - attempted to scan unit W [phone] R- unit not matched; AC contacted for assistance with protocol/policy when blood products unable to be scanned/unmatched.   2105 - Unit W [phone] R returned to blood bank due unit not matching patient and not being able to be scanned into EPIC.  2125 - AC delivered copy of blood administration policy and offered assistance with unmatched blood product note.   2155 - CNA returned with unit W [phone] R re-issued from blood bank.  2200 - Unit verified and matched with patient by this RN and 2nd RN, Geni BATTLE   2205 - Unit W [phone] R started at 120 ml/hr.  2220 - 15 minute post transfusion start time; Rate increased to 150 ml/hr.  0045 - Unit W [phone] [id number] R completed.

## 2024-10-28 NOTE — Progress Notes (Signed)
 Tammy Copping, MD [facility]   [address] [place], [place] [address] Phone: [phone] Fax : [phone]   Subjective: The patient reports doing well today without any complaints.  She states that she has not had any black stools or bloody stools.  Her stools while in the hospital have been green.  The concern is that the patient had an increased lipase of 230 but 7 months ago the lipase was 273 and was as high as 308.  She does report some nausea at this time.  The patient does have a direct bilirubin of 0.9 with a total bilirubin of 1.5 with a mildly elevated AST of 72 and an ALT of 47 with a normal alkaline phosphatase.  The patient's INR is also prolonged at 1.4. Her most recent hemoglobin 1 month ago was 9.4 and she came in yesterday with a hemoglobin of 6.2 which is now 7.0.   Objective: Vital signs in last 24 hours: Vitals:   10/28/24 1245 10/28/24 1300 10/28/24 1315 10/28/24 1330  BP: (!) 118/58 119/67 107/80 114/61  Pulse: 86 78 86 81  Resp: 15 13 (!) 21 14  Temp:    (!) 97.3 F (36.3 C)  TempSrc:    Oral  SpO2: 100% 100% 100% 100%  Weight:      Height:       Weight change:   Intake/Output Summary (Last 24 hours) at 10/28/2024 1415 Last data filed at 10/28/2024 1330 Gross per 24 hour  Intake 5801.09 ml  Output 50 ml  Net 5751.09 ml     Exam: Heart:: Regular rate and rhythm or without murmur or extra heart sounds Lungs: normal and clear to auscultation and percussion Abdomen: soft, nontender, normal bowel sounds   Lab Results: @LABTEST2 @ Micro Results: Recent Results (from the past 240 hours)  Resp panel by RT-PCR (RSV, Flu A&B, Covid) Anterior Nasal Swab     Status: None   Collection Time: 10/27/24 11:01 AM   Specimen: Anterior Nasal Swab  Result Value Ref Range Status   SARS Coronavirus 2 by RT PCR NEGATIVE NEGATIVE Final    Comment: (NOTE) SARS-CoV-2 target nucleic acids are NOT DETECTED.  The SARS-CoV-2 RNA is generally detectable in upper  respiratory specimens during the acute phase of infection. The lowest concentration of SARS-CoV-2 viral copies this assay can detect is 138 copies/mL. A negative result does not preclude SARS-Cov-2 infection and should not be used as the sole basis for treatment or other patient management decisions. A negative result may occur with  improper specimen collection/handling, submission of specimen other than nasopharyngeal swab, presence of viral mutation(s) within the areas targeted by this assay, and inadequate number of viral copies(<138 copies/mL). A negative result must be combined with clinical observations, patient history, and epidemiological information. The expected result is Negative.  Fact Sheet for Patients:  bloggercourse.com  Fact Sheet for Healthcare Providers:  seriousbroker.it  This test is no t yet approved or cleared by the [place]  FDA and  has been authorized for detection and/or diagnosis of SARS-CoV-2 by FDA under an Emergency Use Authorization (EUA). This EUA will remain  in effect (meaning this test can be used) for the duration of the COVID-19 declaration under Section 564(b)(1) of the Act, 21 U.S.C.section 360bbb-3(b)(1), unless the authorization is terminated  or revoked sooner.       Influenza A by PCR NEGATIVE NEGATIVE Final   Influenza B by PCR NEGATIVE NEGATIVE Final    Comment: (NOTE) The Xpert Xpress  SARS-CoV-2/FLU/RSV plus assay is intended as an aid in the diagnosis of influenza from Nasopharyngeal swab specimens and should not be used as a sole basis for treatment. Nasal washings and aspirates are unacceptable for Xpert Xpress SARS-CoV-2/FLU/RSV testing.  Fact Sheet for Patients: bloggercourse.com  Fact Sheet for Healthcare Providers: seriousbroker.it  This test is not yet approved or cleared by the [place]  FDA and has been  authorized for detection and/or diagnosis of SARS-CoV-2 by FDA under an Emergency Use Authorization (EUA). This EUA will remain in effect (meaning this test can be used) for the duration of the COVID-19 declaration under Section 564(b)(1) of the Act, 21 U.S.C. section 360bbb-3(b)(1), unless the authorization is terminated or revoked.     Resp Syncytial Virus by PCR NEGATIVE NEGATIVE Final    Comment: (NOTE) Fact Sheet for Patients: bloggercourse.com  Fact Sheet for Healthcare Providers: seriousbroker.it  This test is not yet approved or cleared by the [place]  FDA and has been authorized for detection and/or diagnosis of SARS-CoV-2 by FDA under an Emergency Use Authorization (EUA). This EUA will remain in effect (meaning this test can be used) for the duration of the COVID-19 declaration under Section 564(b)(1) of the Act, 21 U.S.C. section 360bbb-3(b)(1), unless the authorization is terminated or revoked.  Performed at [facility], [address] [place], [place] [address]   Blood Culture (routine x 2)     Status: None (Preliminary result)   Collection Time: 10/27/24 12:18 PM   Specimen: Right Antecubital; Blood  Result Value Ref Range Status   Specimen Description RIGHT ANTECUBITAL  Final   Special Requests   Final    BOTTLES DRAWN AEROBIC AND ANAEROBIC Blood Culture adequate volume   Culture   Final    NO GROWTH < 24 HOURS Performed at [facility], [address] [place], [place] [address]    Report Status PENDING  Incomplete  Blood Culture (routine x 2)     Status: None (Preliminary result)   Collection Time: 10/27/24 12:18 PM   Specimen: Left Antecubital; Blood  Result Value Ref Range Status   Specimen Description LEFT ANTECUBITAL  Final   Special Requests   Final    BOTTLES DRAWN AEROBIC AND ANAEROBIC Blood Culture results may not be optimal due to an inadequate volume of blood received in  culture bottles   Culture   Final    NO GROWTH < 24 HOURS Performed at [facility], [address] [place], [place] [address]    Report Status PENDING  Incomplete  MRSA Next Gen by PCR, Nasal     Status: Abnormal   Collection Time: 10/27/24  1:38 PM   Specimen: Nasal Mucosa; Nasal Swab  Result Value Ref Range Status   MRSA by PCR Next Gen DETECTED (A) NOT DETECTED Final    Comment: RESULT CALLED TO, READ BACK BY AND VERIFIED WITH: KATHERINE CLAYTON @1520  10/27/24 MJU (NOTE) The GeneXpert MRSA Assay (FDA approved for NASAL specimens only), is one component of a comprehensive MRSA colonization surveillance program. It is not intended to diagnose MRSA infection nor to guide or monitor treatment for MRSA infections. Test performance is not FDA approved in patients less than 58 years old. Performed at [facility], [address] [place], [place] [address]    Studies/Results: DG Chest Port 1 View Result Date: 10/27/2024 CLINICAL DATA:  Central line EXAM: PORTABLE CHEST 1 VIEW COMPARISON:  Chest x-ray 10/27/2024 FINDINGS: There is a new right-sided central venous catheter with distal  tip in the proximal right atrium. The heart is enlarged. Patient is status post cardiac surgery. The lungs are clear. There is no pleural effusion or pneumothorax. No acute fractures are seen. IMPRESSION: New right-sided central venous catheter with distal tip in the proximal right atrium. No pneumothorax. Electronically Signed   By: Greig Pique M.D.   On: 10/27/2024 16:54   DG Chest Port 1 View Result Date: 10/27/2024 CLINICAL DATA:  Sepsis.  GI bleeding.  Hypotension. EXAM: PORTABLE CHEST 1 VIEW COMPARISON:  08/15/2024 FINDINGS: Stable mild cardiomegaly. Prior median sternotomy. Both lungs are clear. IMPRESSION: Mild cardiomegaly. No active lung disease. Electronically Signed   By: Norleen DELENA Kil M.D.   On: 10/27/2024 11:49   Medications: I have reviewed the patient's current  medications. Scheduled Meds:  Chlorhexidine  Gluconate Cloth  6 each Topical Daily   mupirocin  ointment  1 Application Nasal BID   pantoprazole  (PROTONIX ) IV  40 mg Intravenous Q12H   patiromer   25.2 g Oral Daily   sodium chloride  flush  3 mL Intravenous Q12H   Continuous Infusions:  sodium chloride      norepinephrine  (LEVOPHED ) Adult infusion 24 mcg/min (10/28/24 1355)   octreotide  (SANDOSTATIN ) 500 mcg in sodium chloride  0.9 % 250 mL (2 mcg/mL) infusion 50 mcg/hr (10/28/24 1301)   PrismaSol  BGK 2/3.5     PrismaSol  BGK 2/3.5     PrismaSol  BGK 2/3.5     sodium bicarbonate  150 mEq in sterile water  1,150 mL infusion 125 mL/hr at 10/28/24 1301   PRN Meds:.acetaminophen , docusate sodium , heparin , ondansetron  (ZOFRAN ) IV, mouth rinse, polyethylene glycol, sodium chloride  flush, traZODone    Assessment: Principal Problem:   Hemorrhagic shock (HCC) Active Problems:   Acute renal failure   Shock (HCC)   Acute GI bleeding    Plan: The patient has a history of GI bleeding in the past and has had an extensive workup with a history of tricuspid regurgitation atrial fibrillation with right ventricular systolic dysfunction and ejection fraction between 20 and 30%.  The patient also has pulmonary hypertension diabetes hypertension and liver cirrhosis with grade 2 esophageal varices and a colectomy with a total colectomy and end ileostomy. The patient is being resuscitated at the present time.  Her last EGD was in September of this year and she had 1 previously in August and prior to that in May of this year.  She has also had a lower scope back in May.  The patient underwent a capsule endoscopy in August that also did not show any sign of active GI bleeding.  I discussed the options with the patient and we will continue to follow the hemoglobin while the patient is being resuscitated and stabilized in the ICU.   LOS: 1 day   Tammy Copping, MD.FACG 10/28/2024, 2:15 PM Pager [phone] 7am-5pm   Check AMION for 5pm -7am coverage and on weekends

## 2024-10-28 NOTE — Progress Notes (Addendum)
 Initial Nutrition Assessment  DOCUMENTATION CODES:   Non-severe (moderate) malnutrition in context of chronic illness  INTERVENTION:   Boost Breeze po TID, each supplement provides 250 kcal and 9 grams of protein  Glucerna Shake po TID with diet advancement, each supplement provides 220 kcal and 10 grams of protein  Rena-vit po daily   Thiamine  100mg  po daily x 7 days   Pt at high refeed risk; recommend monitor potassium, magnesium  and phosphorus labs daily until stable  Daily weights   Check vitamin C , K, B12, zinc and copper levels  NUTRITION DIAGNOSIS:   Moderate Malnutrition related to chronic illness as evidenced by moderate fat depletion, moderate muscle depletion.  GOAL:   Patient will meet greater than or equal to 90% of their needs  MONITOR:   PO intake, Supplement acceptance, Diet advancement, Labs, Weight trends, I & O's, Skin  REASON FOR ASSESSMENT:   Consult Assessment of nutrition requirement/status  ASSESSMENT:   68 y/o female with h/o ischemic cardiomyopathy, gastric AVM, GI angiodysplasia, esophageal varices, IBS-D, IDA, s/p watchman device, cholelithiasis, TIA, hypothyroidism, pulmonary hypertension, GERD, barrett's esophagus, HLD, HTN, OSA, CAD s/p CABG x 3, PAF, CHF, cirrhosis, portal hypertension, gout, CKD III, B12 deficiency, ventral hernia s/p mesh repair 2018, colonic angioectasias, diverticulosis, DDD, hiatal hernia, C-diff, pancolitis s/p exploratory laparotomy, 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  Met with pt in room today. Pt is well known to this RD from numerous previous admissions. Pt reports poor appetite and oral intake since Thanksgiving r/t nausea and early satiety. Pt reports that she has been having GI bleeding for several days at home that has been getting  progressively worse. Pt with a h/o numerous vitamin deficiencies. Pt reports that she has continued to take vitamin C  daily at home; pt reports that she completed a 6 day course of vitamin K at home after her last discharge. Ecchymosis is improved on exam today from her last admission. RD will recheck vitamin labs today. Pt is agreeable to vanilla Glucerna with diet advancement. Pt is at high refeed risk. Plan is for CRRT initiation today. Per chart, pt is currently up ~17lbs since admission and remains up ~11lbs from her UBW. Pt +6.3L on her I & Os.   Medications reviewed and include: protonix , veltessa, levophed , octreotide , sodium bicarbonate    Labs reviewed: Na 127(L), K 5.2(H), BUN 68(H), creat 7.25(H), P 5.1(H), Mg 1.5(L), tbili 1.5(H) Hgb 7.0(L), Hct 20.5(L) INR- 1.4(H) Cbgs- 142, 119, 123 x 24hrs  AIC 5.9(H)- 4/2  NUTRITION - FOCUSED PHYSICAL EXAM:  Flowsheet Row Most Recent Value  Orbital Region Mild depletion  Upper Arm Region Moderate depletion  Thoracic and Lumbar Region Moderate depletion  Buccal Region No depletion  Temple Region Moderate depletion  Clavicle Bone Region Moderate depletion  Clavicle and Acromion Bone Region Moderate depletion  Scapular Bone Region Moderate depletion  Dorsal Hand Mild depletion  Patellar Region Moderate depletion  Anterior Thigh Region Moderate depletion  Posterior Calf Region Moderate depletion  Edema (RD Assessment) None  Hair Reviewed  Eyes Reviewed  Mouth Reviewed  Skin Reviewed  Nails Reviewed   Diet Order:   Diet Order             Diet clear liquid Room service appropriate? Yes; Fluid consistency: Thin  Diet effective now  EDUCATION NEEDS:   Education needs have been addressed  Skin:  Skin Assessment: Reviewed RN Assessment  Last BM:  12/1- 50ml via ostomy  Height:   Ht Readings from Last 1 Encounters:  10/27/24 5' 1 (1.549 m)    Weight:   Wt Readings from Last 1 Encounters:  10/28/24  61.4 kg    Ideal Body Weight:  47.7 kg  BMI:  Body mass index is 25.58 kg/m.  Estimated Nutritional Needs:   Kcal:  1600-1800kcal/day  Protein:  80-90g/day  Fluid:  1.4-1.6L/day  Augustin Shams MS, RD, LDN If unable to be reached, please send secure chat to RD inpatient available from 8:00a-4:00p daily

## 2024-10-28 NOTE — Progress Notes (Signed)
Pt transported to and from CT without issues.

## 2024-10-29 LAB — CBC WITH DIFFERENTIAL/PLATELET
Abs Immature Granulocytes: 0.02 K/uL (ref 0.00–0.07)
Basophils Absolute: 0 K/uL (ref 0.0–0.1)
Basophils Relative: 0 %
Eosinophils Absolute: 0.5 K/uL (ref 0.0–0.5)
Eosinophils Relative: 9 %
HCT: 24.1 % — ABNORMAL LOW (ref 36.0–46.0)
Hemoglobin: 8.4 g/dL — ABNORMAL LOW (ref 12.0–15.0)
Immature Granulocytes: 0 %
Lymphocytes Relative: 4 %
Lymphs Abs: 0.2 K/uL — ABNORMAL LOW (ref 0.7–4.0)
MCH: 31.9 pg (ref 26.0–34.0)
MCHC: 34.9 g/dL (ref 30.0–36.0)
MCV: 91.6 fL (ref 80.0–100.0)
Monocytes Absolute: 0.2 K/uL (ref 0.1–1.0)
Monocytes Relative: 3 %
Neutro Abs: 4.5 K/uL (ref 1.7–7.7)
Neutrophils Relative %: 84 %
Platelets: 66 K/uL — ABNORMAL LOW (ref 150–400)
RBC: 2.63 MIL/uL — ABNORMAL LOW (ref 3.87–5.11)
RDW: 22.7 % — ABNORMAL HIGH (ref 11.5–15.5)
Smear Review: NORMAL
WBC: 5.4 K/uL (ref 4.0–10.5)
nRBC: 1.7 % — ABNORMAL HIGH (ref 0.0–0.2)

## 2024-10-29 LAB — RENAL FUNCTION PANEL
Albumin: 3.3 g/dL — ABNORMAL LOW (ref 3.5–5.0)
Albumin: 3.4 g/dL — ABNORMAL LOW (ref 3.5–5.0)
Anion gap: 13 (ref 5–15)
Anion gap: 10 (ref 5–15)
BUN: 32 mg/dL — ABNORMAL HIGH (ref 8–23)
BUN: 21 mg/dL (ref 8–23)
CO2: 24 mmol/L (ref 22–32)
CO2: 25 mmol/L (ref 22–32)
Calcium: 8.3 mg/dL — ABNORMAL LOW (ref 8.9–10.3)
Calcium: 8.1 mg/dL — ABNORMAL LOW (ref 8.9–10.3)
Chloride: 95 mmol/L — ABNORMAL LOW (ref 98–111)
Chloride: 97 mmol/L — ABNORMAL LOW (ref 98–111)
Creatinine, Ser: 3.16 mg/dL — ABNORMAL HIGH (ref 0.44–1.00)
Creatinine, Ser: 2.23 mg/dL — ABNORMAL HIGH (ref 0.44–1.00)
GFR, Estimated: 15 mL/min — ABNORMAL LOW (ref >60)
GFR, Estimated: 23 mL/min — ABNORMAL LOW (ref >60)
Glucose, Bld: 161 mg/dL — ABNORMAL HIGH (ref 70–99)
Glucose, Bld: 225 mg/dL — ABNORMAL HIGH (ref 70–99)
Phosphorus: 3.2 mg/dL (ref 2.5–4.6)
Phosphorus: 2.2 mg/dL — ABNORMAL LOW (ref 2.5–4.6)
Potassium: 3.8 mmol/L (ref 3.5–5.1)
Potassium: 3.4 mmol/L — ABNORMAL LOW (ref 3.5–5.1)
Sodium: 132 mmol/L — ABNORMAL LOW (ref 135–145)
Sodium: 131 mmol/L — ABNORMAL LOW (ref 135–145)

## 2024-10-29 LAB — VITAMIN B12: Vitamin B-12: 1902 pg/mL — ABNORMAL HIGH (ref 180–914)

## 2024-10-29 LAB — GLUCOSE, CAPILLARY
Glucose-Capillary: 170 mg/dL — ABNORMAL HIGH (ref 70–99)
Glucose-Capillary: 156 mg/dL — ABNORMAL HIGH (ref 70–99)
Glucose-Capillary: 246 mg/dL — ABNORMAL HIGH (ref 70–99)
Glucose-Capillary: 232 mg/dL — ABNORMAL HIGH (ref 70–99)
Glucose-Capillary: 234 mg/dL — ABNORMAL HIGH (ref 70–99)
Glucose-Capillary: 189 mg/dL — ABNORMAL HIGH (ref 70–99)
Glucose-Capillary: 208 mg/dL — ABNORMAL HIGH (ref 70–99)

## 2024-10-29 LAB — HEMOGLOBIN AND HEMATOCRIT, BLOOD
HCT: 20.9 % — ABNORMAL LOW (ref 36.0–46.0)
HCT: 22 % — ABNORMAL LOW (ref 36.0–46.0)
HCT: 21.4 % — ABNORMAL LOW (ref 36.0–46.0)
HCT: 20.7 % — ABNORMAL LOW (ref 36.0–46.0)
Hemoglobin: 7.2 g/dL — ABNORMAL LOW (ref 12.0–15.0)
Hemoglobin: 7.6 g/dL — ABNORMAL LOW (ref 12.0–15.0)
Hemoglobin: 7.2 g/dL — ABNORMAL LOW (ref 12.0–15.0)
Hemoglobin: 7.1 g/dL — ABNORMAL LOW (ref 12.0–15.0)

## 2024-10-29 LAB — PREPARE RBC (CROSSMATCH)

## 2024-10-29 MED ORDER — K PHOS MONO-SOD PHOS DI & MONO 155-852-130 MG PO TABS
500.0000 mg | ORAL_TABLET | ORAL | Status: AC
  Administered 2024-10-29 (×2): 500 mg via ORAL
  Filled 2024-10-29 (×2): qty 2

## 2024-10-29 MED ORDER — VASOPRESSIN 20 UNITS/100 ML INFUSION FOR SHOCK
0.0000 [IU]/min | INTRAVENOUS | Status: DC
  Administered 2024-10-29 (×2): 0.03 [IU]/min via INTRAVENOUS
  Filled 2024-10-29 (×3): qty 100

## 2024-10-29 MED ORDER — ALBUMIN HUMAN 25 % IV SOLN
25.0000 g | Freq: Two times a day (BID) | INTRAVENOUS | Status: AC
  Administered 2024-10-29: 25 g via INTRAVENOUS
  Administered 2024-10-29: 12.5 g via INTRAVENOUS
  Filled 2024-10-29 (×2): qty 100

## 2024-10-29 MED ORDER — VASOPRESSIN 20 UNIT/ML IV SOLN: 0.0000 [IU]/min | INTRAVENOUS | Status: DC

## 2024-10-29 MED ORDER — SODIUM CHLORIDE 0.9% IV SOLUTION: Freq: Once | INTRAVENOUS | Status: AC

## 2024-10-29 MED ORDER — LACTATED RINGERS IV BOLUS
1000.0000 mL | Freq: Once | INTRAVENOUS | Status: AC
  Administered 2024-10-29: 1000 mL via INTRAVENOUS

## 2024-10-29 MED ORDER — GLUCERNA SHAKE PO LIQD
237.0000 mL | Freq: Three times a day (TID) | ORAL | Status: DC
  Administered 2024-10-29 – 2024-11-17 (×53): 237 mL via ORAL

## 2024-10-29 MED ORDER — POTASSIUM CHLORIDE CRYS ER 20 MEQ PO TBCR
40.0000 meq | EXTENDED_RELEASE_TABLET | Freq: Once | ORAL | Status: AC
  Administered 2024-10-29: 40 meq via ORAL
  Filled 2024-10-29: qty 2

## 2024-10-29 MED ORDER — LACTATED RINGERS IV BOLUS: 1000.0000 mL | Freq: Once | INTRAVENOUS | Status: DC

## 2024-10-29 MED ORDER — POTASSIUM PHOSPHATES 15 MMOLE/5ML IV SOLN
15.0000 mmol | Freq: Once | INTRAVENOUS | Status: DC
  Filled 2024-10-29: qty 5

## 2024-10-29 MED ORDER — OXYCODONE HCL 5 MG PO TABS
5.0000 mg | ORAL_TABLET | Freq: Four times a day (QID) | ORAL | Status: DC | PRN
  Administered 2024-10-29: 5 mg via ORAL
  Filled 2024-10-29: qty 1

## 2024-10-29 NOTE — Progress Notes (Signed)
 Upon change of shift crrt filter clotted off, had to end up changing the filter set twice before it would start working correctly, issues with the duration chamber sucking in air. Restarted crrt at 915 am

## 2024-10-29 NOTE — Plan of Care (Signed)
  Problem: Education: Goal: Knowledge of General Education information will improve Description: Including pain rating scale, medication(s)/side effects and non-pharmacologic comfort measures Outcome: Progressing   Problem: Clinical Measurements: Goal: Will remain free from infection Outcome: Progressing Goal: Diagnostic test results will improve Outcome: Progressing Goal: Respiratory complications will improve Outcome: Progressing Goal: Cardiovascular complication will be avoided Outcome: Progressing   Problem: Elimination: Goal: Will not experience complications related to bowel motility Outcome: Progressing   Problem: Pain Managment: Goal: General experience of comfort will improve and/or be controlled Outcome: Progressing   Problem: Safety: Goal: Ability to remain free from injury will improve Outcome: Progressing   Problem: Clinical Measurements: Goal: Ability to maintain clinical measurements within normal limits will improve Outcome: Not Progressing   Problem: Nutrition: Goal: Adequate nutrition will be maintained Outcome: Not Progressing   Problem: Elimination: Goal: Will not experience complications related to urinary retention Outcome: Not Progressing

## 2024-10-29 NOTE — Progress Notes (Signed)
 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 She reports to ED for generalized malaise and is being admitted for Hemorrhagic shock (HCC) [R57.8] Shock (HCC) [R57.9] Acute GI bleeding [K92.2] Acute renal failure, unspecified acute renal failure type [N17.9]  Patient is known to our practice from previous admissions and has seen Dr Dennise in he office once.   Update: Patient seen and evaluated in ICU Alert and oriented Complains of right groin pain Room air Pressor-Levo and Vaso Octreotide  drip  Objective:  Vital signs in last 24 hours:  Temp:  [97.3 F (36.3 C)-99.3 F (37.4 C)] 98.2 F (36.8 C) (12/02 0530) Pulse Rate:  [27-105] 85 (12/02 1045) Resp:  [9-22] 10 (12/02 1045) BP: (91-122)/(38-87) 91/42 (12/02 1045) SpO2:  [88 %-100 %] 92 % (12/02 1045) Weight:  [63.4 kg] 63.4 kg (12/02 0500)  Weight change: 9.966 kg Filed Weights   10/27/24 1336 10/28/24 0441 10/29/24 0500  Weight: 58.2 kg 61.4 kg 63.4 kg    Intake/Output: I/O last 3 completed shifts: In: 8202 [P.O.:390; I.V.:6303; Blood:1409; IV Piggyback:100] Out: 3416.8 [Stool:450]   Intake/Output this shift:  Total I/O In: 727.9 [I.V.:692.5; IV Piggyback:35.4] Out: 500   Physical Exam: General: NAD  Head: Normocephalic, atraumatic. Moist oral mucosa  Eyes: Anicteric  Lungs:  Clear to auscultation, normal effort  Heart: Regular  rate and rhythm  Abdomen:  Soft, nontender. Ileostomy   Extremities:  No peripheral edema.  Neurologic: Awake, alert, conversant  Skin: Warm,dry, no rash  Access: Rt groin Trialysis     Basic Metabolic Panel: Recent Labs  Lab 10/27/24 0908 10/27/24 1640 10/28/24 0431 10/28/24 1602 10/29/24 0619  NA 124* 127* 127* 128* 132*  K 6.1* 5.4* 5.2* 4.5 3.8  CL 96* 100 93* 90* 95*  CO2 9* 10* 12* 13* 24  GLUCOSE 142* 129* 130* 205* 161*  BUN 73* 66* 68* 64* 32*  CREATININE 7.72* 7.07* 7.25* 6.25* 3.16*  CALCIUM  8.9 7.7* 7.8* 7.3* 8.3*  MG  --  1.7 1.5*  --   --   PHOS  --   --  5.1* 4.8* 3.2    Liver Function Tests: Recent Labs  Lab 10/27/24 0908 10/28/24 0431 10/28/24 0746 10/28/24 1602 10/29/24 0619  AST 40  --  72*  --   --   ALT 26  --  47*  --   --   ALKPHOS 120  --  107  --   --   BILITOT 0.7  --  1.5*  --   --   PROT 6.4*  --  5.8*  --   --   ALBUMIN  4.0 3.6 3.6 3.4* 3.3*   Recent Labs  Lab 10/28/24 0746  LIPASE 230*   No results for input(s): AMMONIA in the last 168 hours.  CBC: Recent Labs  Lab 10/27/24 0908 10/28/24 0156 10/28/24 0431 10/28/24 0746 10/28/24 1455 10/28/24 1802 10/28/24 2329 10/29/24 0619  WBC 7.5  --  9.2  --   --   --   --  5.4  NEUTROABS  --   --   --   --   --   --   --  4.5  HGB 6.2*   < > 7.0* 7.0* 8.5* 7.8* 7.2* 8.4*  HCT 19.7*   < > 21.0* 20.5* 25.0* 22.5* 20.9* 24.1*  MCV 115.9*  --  100.5*  --   --   --   --  91.6  PLT 167  --  154  --   --   --   --  66*   < > = values in this interval not displayed.    Cardiac Enzymes: No results for input(s): CKTOTAL, CKMB, CKMBINDEX, TROPONINI in the last 168 hours.  BNP: Invalid input(s): POCBNP  CBG: Recent Labs  Lab 10/28/24 1939 10/28/24 2111 10/28/24 2339 10/29/24 0427 10/29/24 0735  GLUCAP 194* 236* 186* 170* 156*    Microbiology: Results for orders placed or performed during the hospital encounter of 10/27/24  Resp panel by RT-PCR (RSV, Flu A&B,  Covid) Anterior Nasal Swab     Status: None   Collection Time: 10/27/24 11:01 AM   Specimen: Anterior Nasal Swab  Result Value Ref Range Status   SARS Coronavirus 2 by RT PCR NEGATIVE NEGATIVE Final    Comment: (NOTE) SARS-CoV-2 target nucleic acids are NOT DETECTED.  The SARS-CoV-2 RNA is generally detectable in upper respiratory specimens during the acute phase of infection. The lowest concentration of SARS-CoV-2 viral copies this assay can detect is 138 copies/mL. A negative result does not preclude SARS-Cov-2 infection and should not be used as the sole basis for treatment or other patient management decisions. A negative result may occur with  improper specimen collection/handling, submission of specimen other than nasopharyngeal swab, presence of viral mutation(s) within the areas targeted by this assay, and inadequate number of viral copies(<138 copies/mL). A negative result must be combined with clinical observations, patient history, and epidemiological information. The expected result is Negative.  Fact Sheet for Patients:  bloggercourse.com  Fact Sheet for Healthcare Providers:  seriousbroker.it  This test is no t yet approved or cleared by the [place]  FDA and  has been authorized for detection and/or diagnosis of SARS-CoV-2 by FDA under an Emergency Use Authorization (EUA). This EUA will remain  in effect (meaning this test can be used) for the duration of the COVID-19 declaration under Section 564(b)(1) of the Act, 21 U.S.C.section 360bbb-3(b)(1), unless the authorization is terminated  or revoked sooner.       Influenza A by PCR NEGATIVE NEGATIVE Final   Influenza B by PCR NEGATIVE NEGATIVE Final    Comment: (NOTE) The Xpert Xpress SARS-CoV-2/FLU/RSV plus assay is intended as an aid in the diagnosis of influenza from Nasopharyngeal swab specimens and should not be used as a sole basis for treatment. Nasal  washings and aspirates are unacceptable for Xpert Xpress SARS-CoV-2/FLU/RSV testing.  Fact Sheet for Patients: bloggercourse.com  Fact Sheet for Healthcare Providers: seriousbroker.it  This test is not yet approved or cleared by the [place]  FDA and has been authorized for detection and/or diagnosis of SARS-CoV-2 by FDA under an Emergency Use Authorization (EUA). This EUA will remain in effect (meaning this test can be used) for the duration of the COVID-19 declaration under Section 564(b)(1) of the Act, 21 U.S.C. section 360bbb-3(b)(1), unless the authorization is terminated or revoked.     Resp Syncytial Virus by PCR NEGATIVE NEGATIVE Final    Comment: (NOTE) Fact Sheet for Patients: bloggercourse.com  Fact Sheet for Healthcare Providers: seriousbroker.it  This test is not yet approved or cleared by the [place]  FDA and has been authorized for detection and/or diagnosis of SARS-CoV-2 by FDA under an Emergency Use Authorization (EUA). This EUA will remain in effect (meaning this test can be used) for the duration of the COVID-19 declaration under Section 564(b)(1) of the Act, 21 U.S.C. section 360bbb-3(b)(1), unless the authorization is terminated or revoked.  Performed at [facility], [address], [place], [place] [address]   Blood Culture (routine x 2)     Status: None (Preliminary result)   Collection Time: 10/27/24 12:18 PM   Specimen: Right Antecubital; Blood  Result Value Ref Range Status   Specimen Description RIGHT ANTECUBITAL  Final   Special Requests   Final    BOTTLES DRAWN AEROBIC AND ANAEROBIC Blood Culture adequate volume   Culture   Final    NO GROWTH 2 DAYS Performed at [facility], [address] [place], [place] [address]    Report Status PENDING  Incomplete  Blood Culture (routine x 2)     Status: None (Preliminary  result)   Collection Time: 10/27/24 12:18 PM   Specimen: Left Antecubital; Blood  Result Value Ref Range Status   Specimen Description LEFT ANTECUBITAL  Final   Special Requests   Final    BOTTLES DRAWN AEROBIC AND ANAEROBIC Blood Culture results may not be optimal due to an inadequate volume of blood received in culture bottles   Culture   Final    NO GROWTH 2 DAYS Performed at [facility], [address], [place], [place] [address]    Report Status PENDING  Incomplete  MRSA Next Gen by PCR, Nasal     Status: Abnormal   Collection Time: 10/27/24  1:38 PM   Specimen: Nasal Mucosa; Nasal Swab  Result Value Ref Range Status   MRSA by PCR Next Gen DETECTED (A) NOT DETECTED Final    Comment: RESULT CALLED TO, READ BACK BY AND VERIFIED WITH: KATHERINE CLAYTON @1520  10/27/24 MJU (NOTE) The GeneXpert MRSA Assay (FDA approved for NASAL specimens only), is one component of a comprehensive MRSA colonization surveillance program. It is not intended to diagnose MRSA infection nor to guide or monitor treatment for MRSA infections. Test performance is not FDA approved in patients less than 16 years old. Performed at [facility], [address] [place], [place] [address]     Coagulation Studies: Recent Labs    10/27/24 0908 10/28/24 1039  LABPROT 17.2* 18.0*  INR 1.3* 1.4*    Urinalysis: No results for input(s): COLORURINE, LABSPEC, PHURINE, GLUCOSEU, HGBUR, BILIRUBINUR, KETONESUR, PROTEINUR, UROBILINOGEN, NITRITE, LEUKOCYTESUR in the last 72 hours.  Invalid input(s): APPERANCEUR    Imaging: CT ABDOMEN PELVIS WO CONTRAST Result Date: 10/28/2024 CLINICAL DATA:  Sepsis. EXAM: CT ABDOMEN AND PELVIS WITHOUT CONTRAST TECHNIQUE: Multidetector CT imaging of the abdomen and pelvis was performed following the standard protocol without IV contrast. RADIATION DOSE REDUCTION: This exam was performed according to the departmental dose-optimization  program which includes automated exposure control, adjustment of the mA and/or kV according to patient size and/or use of iterative reconstruction technique. COMPARISON:  08/21/2024 FINDINGS: Lower chest: Trace bilateral pleural effusions. Hepatobiliary: Gallstones. No significant gallbladder distension but limited evaluation of the gallbladder. No acute abnormality involving the liver. Small amount of perihepatic ascites. Pancreas: Unremarkable. No pancreatic ductal dilatation or surrounding inflammatory changes. Spleen: Normal in size without focal abnormality. Adrenals/Urinary Tract: Adrenal glands within normal limits. Again noted are small hyperdense structures in  the kidneys are too small to definitively characterize. No definite stones or hydronephrosis. Small amount of fluid in the urinary bladder. Stomach/Bowel: History of colectomy with a Hartmann's pouch. Normal appearance of the stomach. No acute abnormality involving the ileostomy. Limited evaluation of the small bowel structures due to the minimal oral contrast. Difficult to exclude areas of small bowel wall thickening in the left abdomen. Mild mesenteric edema. Vascular/Lymphatic: Atherosclerotic calcifications in the abdominal aorta without aneurysm. Central line in the right common femoral vein that terminates near the junction of the IVC and right common iliac vein. No significant lymph node enlargement in the abdomen or pelvis. Reproductive: Limited evaluation but no gross abnormality to the uterus or adnexal structures. Other: Again noted is free fluid in the pelvis and small amount of upper abdominal ascites. Small amount of free fluid in left lower quadrant. Diffuse subcutaneous edema. Right paracentral ventral hernia in the upper abdomen contains a small amount of fluid and similar to the previous examination. Musculoskeletal: Scoliosis in the thoracolumbar spine. No acute bone abnormality. IMPRESSION: 1. Small amount of ascites in the abdomen  and pelvis and similar to the exam on 08/21/2024. 2. Trace bilateral pleural effusions. 3. Cholelithiasis.  Limited evaluation of the gallbladder. 4. Small hyperdense renal structures that are too small to definitively characterize and recommend attention on follow-up. 5. Limited evaluation of the bowel structures with an ileostomy. No evidence for a bowel obstruction. 6. Aortic Atherosclerosis (ICD10-I70.0). Electronically Signed   By: Juliene Balder M.D.   On: 10/28/2024 15:22   DG Chest Port 1 View Result Date: 10/27/2024 CLINICAL DATA:  Central line EXAM: PORTABLE CHEST 1 VIEW COMPARISON:  Chest x-ray 10/27/2024 FINDINGS: There is a new right-sided central venous catheter with distal tip in the proximal right atrium. The heart is enlarged. Patient is status post cardiac surgery. The lungs are clear. There is no pleural effusion or pneumothorax. No acute fractures are seen. IMPRESSION: New right-sided central venous catheter with distal tip in the proximal right atrium. No pneumothorax. Electronically Signed   By: Greig Pique M.D.   On: 10/27/2024 16:54   DG Chest Port 1 View Result Date: 10/27/2024 CLINICAL DATA:  Sepsis.  GI bleeding.  Hypotension. EXAM: PORTABLE CHEST 1 VIEW COMPARISON:  08/15/2024 FINDINGS: Stable mild cardiomegaly. Prior median sternotomy. Both lungs are clear. IMPRESSION: Mild cardiomegaly. No active lung disease. Electronically Signed   By: Norleen DELENA Kil M.D.   On: 10/27/2024 11:49     Medications:    albumin  human 45 mL/hr at 10/29/24 1100   norepinephrine  (LEVOPHED ) Adult infusion 19 mcg/min (10/29/24 1100)   octreotide  (SANDOSTATIN ) 500 mcg in sodium chloride  0.9 % 250 mL (2 mcg/mL) infusion 50 mcg/hr (10/29/24 1100)   PrismaSol BGK 2/3.5 400 mL/hr at 10/29/24 0900   PrismaSol BGK 2/3.5 400 mL/hr at 10/29/24 0900   PrismaSol BGK 2/3.5 1,500 mL/hr at 10/29/24 0900   vasopressin  0.01 Units/min (10/29/24 1100)    Chlorhexidine  Gluconate Cloth  6 each Topical Daily    feeding supplement  1 Container Oral TID BM   insulin  aspart  0-5 Units Subcutaneous QHS   insulin  aspart  0-9 Units Subcutaneous TID WC   multivitamin  1 tablet Oral QHS   mupirocin  ointment  1 Application Nasal BID   pantoprazole  (PROTONIX ) IV  40 mg Intravenous Q12H   sodium chloride  flush  3 mL Intravenous Q12H   thiamine   100 mg Oral Daily   acetaminophen , docusate sodium , heparin , ondansetron  (ZOFRAN ) IV, mouth rinse, oxyCODONE ,  polyethylene glycol, sodium chloride  flush, traZODone   Assessment/ Plan:  Tammy Arias is a 68 y.o.  female presents to ED with weakness and has been admitted for Hemorrhagic shock (HCC) [R57.8] Shock (HCC) [R57.9] Acute GI bleeding [K92.2] Acute renal failure, unspecified acute renal failure type [N17.9]   Acute Kidney Injury with hyperkalemia, with baseline creatinine 0.92 on 08/22/24.  Acute kidney injury appears multifactorial at this time from hypotension and dehydration. Potassium 6.1 on admission. Corrected some with shifting measures. CRRT initiated on 12/1, remains net even. Due to presence of pressors, will continue CRRT for now. Continue 2K bath.   Lab Results  Component Value Date   CREATININE 3.16 (H) 10/29/2024   CREATININE 6.25 (H) 10/28/2024   CREATININE 7.25 (H) 10/28/2024    Intake/Output Summary (Last 24 hours) at 10/29/2024 1116 Last data filed at 10/29/2024 1100 Gross per 24 hour  Intake 5796.17 ml  Output 3866.8 ml  Net 1929.37 ml    2. Acute metabolic acidosis, S bicarb 9 on ED arrival. Sodium bicarb infusion stopped, will continue to manage with CRRT   3. Anemia with suspected acute blood loss Lab Results  Component Value Date   HGB 8.4 (L) 10/29/2024  Hgb remains decreased. Received a blood transfusion yesterday. Octreotide  infusion ordered. GI consulted and will monitor for now.   4. Hypotension, workup in progress. Suspected cardiogenic vs hemorrhagic shock. Blood pressure managed with Levo and vaso   LOS:  2 Debhora Titus 12/2/202511:16 AM

## 2024-10-29 NOTE — Plan of Care (Signed)
  Problem: Education: Goal: Knowledge of General Education information will improve Description: Including pain rating scale, medication(s)/side effects and non-pharmacologic comfort measures Outcome: Progressing   Problem: Clinical Measurements: Goal: Will remain free from infection Outcome: Progressing Goal: Diagnostic test results will improve Outcome: Progressing Goal: Respiratory complications will improve Outcome: Progressing Goal: Cardiovascular complication will be avoided Outcome: Progressing   Problem: Nutrition: Goal: Adequate nutrition will be maintained Outcome: Progressing   Problem: Coping: Goal: Level of anxiety will decrease Outcome: Progressing   Problem: Elimination: Goal: Will not experience complications related to bowel motility Outcome: Progressing   Problem: Pain Managment: Goal: General experience of comfort will improve and/or be controlled Outcome: Progressing   Problem: Safety: Goal: Ability to remain free from injury will improve Outcome: Progressing   Problem: Health Behavior/Discharge Planning: Goal: Ability to manage health-related needs will improve Outcome: Not Progressing   Problem: Clinical Measurements: Goal: Ability to maintain clinical measurements within normal limits will improve Outcome: Not Progressing   Problem: Activity: Goal: Risk for activity intolerance will decrease Outcome: Not Progressing

## 2024-10-29 NOTE — Consult Note (Addendum)
 PHARMACY CONSULT NOTE  Pharmacy Consult for Electrolyte Monitoring and Replacement   Recent Labs: Potassium (mmol/L)  Date Value  10/29/2024 3.4 (L)   Magnesium  (mg/dL)  Date Value  87/98/7974 1.5 (L)   Calcium  (mg/dL)  Date Value  87/97/7974 8.1 (L)   Albumin  (g/dL)  Date Value  87/97/7974 3.4 (L)  05/18/2020 4.7   Phosphorus (mg/dL)  Date Value  87/97/7974 2.2 (L)   Sodium (mmol/L)  Date Value  10/29/2024 131 (L)   Assessment: 68 y/o female with h/o ischemic cardiomyopathy, gastric AVM, GI angiodysplasia, IBS-D, IDA, s/p watchman device, cholelithiasis, TIA, hypothyroidism, pulmonary hypertension, GERD, barrett's esophagus, HLD, HTN, OSA, CAD s/p CABG x 3, PAF, CHF, cirrhosis, portal hypertension, gout, CKD III, B12 deficiency, ventral hernia s/p mesh repair 2018, colonic angioectasias, diverticulosis, DDD, hiatal hernia, esophageal varices. Pharmacy is asked to follow and replace electrolytes while in CCU  MIVF: Sodium bicarbonate  150 mEq in sterile water  at 125 mL/hr  Goal of Therapy:  Electrolytes WNL  Patient is currently on CRRT  12/2 15:56: K 3.4, Phos 2.2  Plan:  -- Will order Kphos neutral 500 mg PO Q4H x 2 doses   -- Will continue to monitor renal function panel daily at 0500 and 1600   Ransom Blanch PGY-1 Pharmacy Resident  [place] [facility]  10/29/2024 6:06 PM

## 2024-10-29 NOTE — Progress Notes (Signed)
 UNMATCHED BLOOD PRODUCT NOTE  Compare the patient ID on the blood tag to the patient ID on the hospital armband and Blood Bank armband. Then confirm the unit number on the blood tag matches the unit number on the blood product.  If a discrepancy is discovered return the product to blood bank immediately.   Blood Product Type: Packed Red Blood Cells  Unit #: (Found on blood product bag, begins with W) [id number]  Product Code #: (Found on blood product bag, begins with E) [id number]   Start Time: 0245  Starting Rate: 120 ml/hr  Rate increase/decreased  (if applicable):       ml/hr 150  Rate changed time (if applicable):  0303  Stop Time:0520   All Other Documentation should be documented within the Blood Admin Flowsheet per policy.

## 2024-10-29 NOTE — Progress Notes (Signed)
 NAME:  Tammy Arias, MRN:  [id number], DOB:  March 26, 1956, LOS: 2 ADMISSION DATE:  10/27/2024    CHIEF COMPLAINT:  Circulatory shock  History of Present Illness:  68 y.o.female with medical problems of heart failure with reduced ejection fraction 25 to 30%, severe mitral regurgitation, moderate right ventricular systolic dysfunction, severe tricuspid regurgitation, atrial fibrillation, failed Watchman device, chronic anticoagulation with apixaban , GI bleed, coronary disease status post CABG in 2005, aortic stenosis repair, pulmonary hypertension, type 2 diabetes, hypertension, liver cirrhosis with grade 2 esophageal varices, C. 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    She reports to ED for generalized malaise and is being admitted for hypotension +GIB from ileostomy   She states she was in her normal state of health on Thanksgiving and was able to eat well. Started feeling bad on Friday. Was unable to eat on Saturday. States she has had a lot of bloody drainage from ileostomy.  had progressive weakness since yesterday.      Significant Hospital Events: Including procedures, antibiotic start and stop dates in addition to other pertinent events   11/30 admitted for hemorraghic shock, given 3-4 Liters of fluid, baseline SBP 90's 10/29/24- patient remains on vasopressors.  Platelet with significant drop overnight but appears to be chronic when trended over past 5 years.     Antimicrobials:   Antibiotics Given (last 72 hours)     Date/Time Action Medication Dose Rate   10/27/24 0929 New Bag/Given   cefTRIAXone  (ROCEPHIN ) 1 g in sodium chloride  0.9 % 100 mL IVPB 1 g 200 mL/hr   10/27/24 1220 New Bag/Given   ceFEPIme  (MAXIPIME ) 2 g in sodium chloride  0.9 % 100 mL IVPB 2 g 200 mL/hr   10/27/24 1440 New Bag/Given   metroNIDAZOLE  (FLAGYL ) IVPB 500 mg 500 mg 100 mL/hr        Objective   Blood pressure 106/84, pulse 80, temperature  98.2 F (36.8 C), temperature source Oral, resp. rate 15, height 5' 1 (1.549 m), weight 63.4 kg, SpO2 100%.        Intake/Output Summary (Last 24 hours) at 10/29/2024 0801 Last data filed at 10/29/2024 0700 Gross per 24 hour  Intake 5824.29 ml  Output 3301 ml  Net 2523.29 ml   Filed Weights   10/27/24 1336 10/28/24 0441 10/29/24 0500  Weight: 58.2 kg 61.4 kg 63.4 kg   GENERAL:critically ill appearing EYES: Pupils equal, round, reactive to light.  No scleral icterus.  MOUTH: Moist mucosal membrane NECK: Supple.  PULMONARY: Lungs clear to auscultation, +rhonchi, +wheezing CARDIOVASCULAR: S1 and S2.  Regular rate and rhythm GASTROINTESTINAL: Soft, nontender, -distended. Positive bowel sounds. Ileostomy in place MUSCULOSKELETAL: No swelling, clubbing, or edema.  NEUROLOGIC: alert and awake SKIN:normal, warm to touch, Capillary refill delayed  Pulses present bilaterally   Labs/imaging that I havepersonally reviewed  (right click and Reselect all SmartList Selections daily)    ASSESSMENT AND PLAN SYNOPSIS 68 yo white female with multiple medical issues admitted for severe and acute hypovolumic shock GIB from Ileostomy  Hemorrhagic  SHOCK SOURCE-GIB -use vasopressors to keep MAP>65 as needed -follow ABG and LA - cultures- negative thus far   ACUTE ANEMIA- TRANSFUSE AS NEEDED CONSIDER TRANSFUSION  IF HGB<7 DVT PRX with TED/SCD's ONLY    CARDIAC ICU monitoring   ACUTE KIDNEY INJURY/Renal Failure -continue Foley Catheter-assess need -Avoid nephrotoxic agents -Follow urine output, BMP -Ensure adequate renal perfusion, optimize oxygenation -Renal dose medications   Intake/Output Summary (Last  24 hours) at 10/29/2024 0801 Last data filed at 10/29/2024 0700 Gross per 24 hour  Intake 5824.29 ml  Output 3301 ml  Net 2523.29 ml       Latest Ref Rng & Units 10/29/2024    6:19 AM 10/28/2024    4:02 PM 10/28/2024    4:31 AM  BMP  Glucose 70 - 99 mg/dL 838  794   869   BUN 8 - 23 mg/dL 32  64  68   Creatinine 0.44 - 1.00 mg/dL 6.83  3.74  2.74   Sodium 135 - 145 mmol/L 132  128  127   Potassium 3.5 - 5.1 mmol/L 3.8  4.5  5.2   Chloride 98 - 111 mmol/L 95  90  93   CO2 22 - 32 mmol/L 24  13  12    Calcium  8.9 - 10.3 mg/dL 8.3  7.3  7.8     ENDO - ICU hypoglycemic\Hyperglycemia protocol -check FSBS per protocol   GI GI PROPHYLAXIS as indicated  NUTRITIONAL STATUS DIET-->NPO Constipation protocol as indicated Gi consulted  ELECTROLYTES -follow labs as needed -replace as needed -pharmacy consultation and following     Best practice (right click and Reselect all SmartList Selections daily)  Diet:  NPO DVT prophylaxis: Contraindicated GI prophylaxis: PPI Mobility:  bed rest  Code Status:  FULL CODE Disposition: ICU  Labs   CBC: Recent Labs  Lab 10/27/24 0908 10/28/24 0156 10/28/24 0431 10/28/24 0746 10/28/24 1455 10/28/24 1802 10/28/24 2329 10/29/24 0619  WBC 7.5  --  9.2  --   --   --   --  5.4  NEUTROABS  --   --   --   --   --   --   --  4.5  HGB 6.2*   < > 7.0* 7.0* 8.5* 7.8* 7.2* 8.4*  HCT 19.7*   < > 21.0* 20.5* 25.0* 22.5* 20.9* 24.1*  MCV 115.9*  --  100.5*  --   --   --   --  91.6  PLT 167  --  154  --   --   --   --  66*   < > = values in this interval not displayed.    Basic Metabolic Panel: Recent Labs  Lab 10/27/24 0908 10/27/24 1640 10/28/24 0431 10/28/24 1602 10/29/24 0619  NA 124* 127* 127* 128* 132*  K 6.1* 5.4* 5.2* 4.5 3.8  CL 96* 100 93* 90* 95*  CO2 9* 10* 12* 13* 24  GLUCOSE 142* 129* 130* 205* 161*  BUN 73* 66* 68* 64* 32*  CREATININE 7.72* 7.07* 7.25* 6.25* 3.16*  CALCIUM  8.9 7.7* 7.8* 7.3* 8.3*  MG  --  1.7 1.5*  --   --   PHOS  --   --  5.1* 4.8* 3.2   GFR: Estimated Creatinine Clearance: 14.3 mL/min (A) (by C-G formula based on SCr of 3.16 mg/dL (H)). Recent Labs  Lab 10/27/24 0908 10/27/24 1218 10/28/24 0431 10/28/24 1135 10/28/24 1325 10/28/24 1602 10/28/24 1930  10/29/24 0619  PROCALCITON  --   --   --   --   --  0.34  --   --   WBC 7.5  --  9.2  --   --   --   --  5.4  LATICACIDVEN  --    < >  --  3.8* 4.1* 4.6* 4.4*  --    < > = values in this interval not displayed.    Liver Function Tests: Recent  Labs  Lab 10/27/24 0908 10/28/24 0431 10/28/24 0746 10/28/24 1602 10/29/24 0619  AST 40  --  72*  --   --   ALT 26  --  47*  --   --   ALKPHOS 120  --  107  --   --   BILITOT 0.7  --  1.5*  --   --   PROT 6.4*  --  5.8*  --   --   ALBUMIN  4.0 3.6 3.6 3.4* 3.3*   Recent Labs  Lab 10/28/24 0746  LIPASE 230*   No results for input(s): AMMONIA in the last 168 hours.  ABG    Component Value Date/Time   PHART 7.47 (H) 03/01/2024 1055   PCO2ART 40 03/01/2024 1055   PO2ART 112 (H) 03/01/2024 1055   HCO3 29.1 (H) 03/01/2024 1055   ACIDBASEDEF 2.7 (H) 02/29/2024 0952   O2SAT 67.3 08/22/2024 0500     Coagulation Profile: Recent Labs  Lab 10/27/24 0908 10/28/24 1039  INR 1.3* 1.4*    Cardiac Enzymes: No results for input(s): CKTOTAL, CKMB, CKMBINDEX, TROPONINI in the last 168 hours.  HbA1C: Hgb A1c MFr Bld  Date/Time Value Ref Range Status  02/28/2024 06:08 AM 5.9 (H) 4.8 - 5.6 % Final    Comment:    (NOTE) Pre diabetes:          5.7%-6.4%  Diabetes:              >6.4%  Glycemic control for   <7.0% adults with diabetes   06/07/2023 02:58 AM 6.3 (H) 4.8 - 5.6 % Final    Comment:    (NOTE)         Prediabetes: 5.7 - 6.4         Diabetes: >6.4         Glycemic control for adults with diabetes: <7.0     CBG: Recent Labs  Lab 10/28/24 1939 10/28/24 2111 10/28/24 2339 10/29/24 0427 10/29/24 0735  GLUCAP 194* 236* 186* 170* 156*    Allergies Allergies  Allergen Reactions   Vioxx [Rofecoxib] Swelling       Critical care provider statement:   Total critical care time: 33 minutes   Performed by: Parris MD   Critical care time was exclusive of separately billable procedures and treating  other patients.   Critical care was necessary to treat or prevent imminent or life-threatening deterioration.   Critical care was time spent personally by me on the following activities: development of treatment plan with patient and/or surrogate as well as nursing, discussions with consultants, evaluation of patient's response to treatment, examination of patient, obtaining history from patient or surrogate, ordering and performing treatments and interventions, ordering and review of laboratory studies, ordering and review of radiographic studies, pulse oximetry and re-evaluation of patient's condition.    Mila Pair, M.D.  Pulmonary & Critical Care Medicine

## 2024-10-30 LAB — URINALYSIS, W/ REFLEX TO CULTURE (INFECTION SUSPECTED)
Bilirubin Urine: NEGATIVE
Glucose, UA: 150 mg/dL — AB
Ketones, ur: NEGATIVE mg/dL
Nitrite: NEGATIVE
Protein, ur: 100 mg/dL — AB
Specific Gravity, Urine: 1.016 (ref 1.005–1.030)
WBC, UA: >50 WBC/hpf (ref 0–5)
pH: 5 (ref 5.0–8.0)

## 2024-10-30 LAB — PREPARE RBC (CROSSMATCH)

## 2024-10-30 LAB — CBC WITH DIFFERENTIAL/PLATELET
Abs Immature Granulocytes: 0.02 K/uL (ref 0.00–0.07)
Basophils Absolute: 0 K/uL (ref 0.0–0.1)
Basophils Relative: 0 %
Eosinophils Absolute: 0.6 K/uL — ABNORMAL HIGH (ref 0.0–0.5)
Eosinophils Relative: 16 %
HCT: 20.9 % — ABNORMAL LOW (ref 36.0–46.0)
Hemoglobin: 7.1 g/dL — ABNORMAL LOW (ref 12.0–15.0)
Immature Granulocytes: 1 %
Lymphocytes Relative: 6 %
Lymphs Abs: 0.2 K/uL — ABNORMAL LOW (ref 0.7–4.0)
MCH: 32.3 pg (ref 26.0–34.0)
MCHC: 34 g/dL (ref 30.0–36.0)
MCV: 95 fL (ref 80.0–100.0)
Monocytes Absolute: 0.5 K/uL (ref 0.1–1.0)
Monocytes Relative: 13 %
Neutro Abs: 2.3 K/uL (ref 1.7–7.7)
Neutrophils Relative %: 64 %
Platelets: 40 K/uL — ABNORMAL LOW (ref 150–400)
RBC: 2.2 MIL/uL — ABNORMAL LOW (ref 3.87–5.11)
RDW: 23.8 % — ABNORMAL HIGH (ref 11.5–15.5)
WBC: 3.6 K/uL — ABNORMAL LOW (ref 4.0–10.5)
nRBC: 1.1 % — ABNORMAL HIGH (ref 0.0–0.2)

## 2024-10-30 LAB — HEMOGLOBIN A1C
Hgb A1c MFr Bld: 5.5 % (ref 4.8–5.6)
Mean Plasma Glucose: 111 mg/dL

## 2024-10-30 LAB — RENAL FUNCTION PANEL
Albumin: 3.6 g/dL (ref 3.5–5.0)
Anion gap: 8 (ref 5–15)
BUN: 13 mg/dL (ref 8–23)
CO2: 26 mmol/L (ref 22–32)
Calcium: 8.8 mg/dL — ABNORMAL LOW (ref 8.9–10.3)
Chloride: 98 mmol/L (ref 98–111)
Creatinine, Ser: 1.57 mg/dL — ABNORMAL HIGH (ref 0.44–1.00)
GFR, Estimated: 35 mL/min — ABNORMAL LOW (ref >60)
Glucose, Bld: 181 mg/dL — ABNORMAL HIGH (ref 70–99)
Phosphorus: 1.5 mg/dL — ABNORMAL LOW (ref 2.5–4.6)
Potassium: 3.5 mmol/L (ref 3.5–5.1)
Sodium: 133 mmol/L — ABNORMAL LOW (ref 135–145)

## 2024-10-30 LAB — HEPATITIS B CORE ANTIBODY, TOTAL: HEP B CORE AB: NEGATIVE

## 2024-10-30 LAB — HEMOGLOBIN AND HEMATOCRIT, BLOOD
HCT: 23.3 % — ABNORMAL LOW (ref 36.0–46.0)
HCT: 23.1 % — ABNORMAL LOW (ref 36.0–46.0)
HCT: 22.5 % — ABNORMAL LOW (ref 36.0–46.0)
Hemoglobin: 7.8 g/dL — ABNORMAL LOW (ref 12.0–15.0)
Hemoglobin: 7.7 g/dL — ABNORMAL LOW (ref 12.0–15.0)
Hemoglobin: 7.3 g/dL — ABNORMAL LOW (ref 12.0–15.0)

## 2024-10-30 LAB — GLUCOSE, CAPILLARY
Glucose-Capillary: 203 mg/dL — ABNORMAL HIGH (ref 70–99)
Glucose-Capillary: 297 mg/dL — ABNORMAL HIGH (ref 70–99)
Glucose-Capillary: 173 mg/dL — ABNORMAL HIGH (ref 70–99)
Glucose-Capillary: 209 mg/dL — ABNORMAL HIGH (ref 70–99)
Glucose-Capillary: 157 mg/dL — ABNORMAL HIGH (ref 70–99)

## 2024-10-30 LAB — ZINC: Zinc: 50 ug/dL (ref 44–115)

## 2024-10-30 LAB — PHOSPHORUS
Phosphorus: 1.6 mg/dL — ABNORMAL LOW (ref 2.5–4.6)
Phosphorus: <1 mg/dL — CL (ref 2.5–4.6)

## 2024-10-30 LAB — MAGNESIUM: Magnesium: 1.7 mg/dL (ref 1.7–2.4)

## 2024-10-30 LAB — HEPATITIS B SURFACE ANTIBODY, QUANTITATIVE: Hep B S AB Quant (Post): 52.3 m[IU]/mL

## 2024-10-30 LAB — COPPER, SERUM: Copper: 132 ug/dL (ref 80–158)

## 2024-10-30 MED ORDER — ALTEPLASE 2 MG IJ SOLR
2.0000 mg | Freq: Once | INTRAMUSCULAR | Status: DC
  Filled 2024-10-30: qty 2

## 2024-10-30 MED ORDER — SODIUM CHLORIDE 0.9% IV SOLUTION: Freq: Once | INTRAVENOUS | Status: AC

## 2024-10-30 MED ORDER — INSULIN GLARGINE-YFGN 100 UNIT/ML ~~LOC~~ SOLN
7.0000 [IU] | Freq: Every day | SUBCUTANEOUS | Status: DC
  Administered 2024-10-30 – 2024-11-04 (×6): 7 [IU] via SUBCUTANEOUS
  Filled 2024-10-30 (×6): qty 0.07

## 2024-10-30 MED ORDER — POTASSIUM PHOSPHATES 15 MMOLE/5ML IV SOLN
45.0000 mmol | Freq: Once | INTRAVENOUS | Status: AC
  Administered 2024-10-31: 45 mmol via INTRAVENOUS
  Filled 2024-10-30: qty 15

## 2024-10-30 MED ORDER — FENTANYL CITRATE (PF) 50 MCG/ML IJ SOSY
PREFILLED_SYRINGE | INTRAMUSCULAR | Status: AC
  Administered 2024-10-30: 25 ug
  Filled 2024-10-30: qty 1

## 2024-10-30 MED ORDER — MIDODRINE HCL 5 MG PO TABS
10.0000 mg | ORAL_TABLET | Freq: Three times a day (TID) | ORAL | Status: DC
  Administered 2024-10-30 – 2024-11-03 (×12): 10 mg via ORAL
  Filled 2024-10-30 (×12): qty 2

## 2024-10-30 MED ORDER — K PHOS MONO-SOD PHOS DI & MONO 155-852-130 MG PO TABS
500.0000 mg | ORAL_TABLET | ORAL | Status: AC
  Administered 2024-10-30 (×2): 500 mg via ORAL
  Filled 2024-10-30 (×2): qty 2

## 2024-10-30 MED ORDER — MAGNESIUM SULFATE IN D5W 1-5 GM/100ML-% IV SOLN
1.0000 g | Freq: Once | INTRAVENOUS | Status: AC
  Administered 2024-10-30: 1 g via INTRAVENOUS
  Filled 2024-10-30: qty 100

## 2024-10-30 MED ORDER — FENTANYL CITRATE (PF) 50 MCG/ML IJ SOSY: 25.0000 ug | PREFILLED_SYRINGE | Freq: Once | INTRAMUSCULAR | Status: AC

## 2024-10-30 MED ORDER — STERILE WATER FOR INJECTION IJ SOLN
INTRAMUSCULAR | Status: AC
  Administered 2024-10-30: 10 mL
  Filled 2024-10-30: qty 10

## 2024-10-30 NOTE — Progress Notes (Signed)
 NAME:  Tammy Arias, MRN:  [id number], DOB:  1955/12/03, LOS: 3 ADMISSION DATE:  10/27/2024    CHIEF COMPLAINT:  Circulatory shock  History of Present Illness:  68 y.o.female with medical problems of heart failure with reduced ejection fraction 25 to 30%, severe mitral regurgitation, moderate right ventricular systolic dysfunction, severe tricuspid regurgitation, atrial fibrillation, failed Watchman device, chronic anticoagulation with apixaban , GI bleed, coronary disease status post CABG in 2005, aortic stenosis repair, pulmonary hypertension, type 2 diabetes, hypertension, liver cirrhosis with grade 2 esophageal varices, C. 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    She reports to ED for generalized malaise and is being admitted for hypotension +GIB from ileostomy   She states she was in her normal state of health on Thanksgiving and was able to eat well. Started feeling bad on Friday. Was unable to eat on Saturday. States she has had a lot of bloody drainage from ileostomy.  had progressive weakness since yesterday.      Significant Hospital Events: Including procedures, antibiotic start and stop dates in addition to other pertinent events   11/30 admitted for hemorraghic shock, given 3-4 Liters of fluid, baseline SBP 90's 10/29/24- patient remains on vasopressors.  Platelet with significant drop overnight but appears to be chronic when trended over past 5 years.  10/30/24- patient remains critically ill, her renal function is improving, remains with electrolyte derrangements including low phos, low Na, low Ca, elevated glucose.  CBC with borderline low Hb, thrombocytopenia, eosinophilia. Blood cultures negative to date.  RRT is ongoing. S/p 1 unit prbc transfusion today.    Antimicrobials:   Antibiotics Given (last 72 hours)     Date/Time Action Medication Dose Rate   10/27/24 0929 New Bag/Given   cefTRIAXone  (ROCEPHIN ) 1 g in  sodium chloride  0.9 % 100 mL IVPB 1 g 200 mL/hr   10/27/24 1220 New Bag/Given   ceFEPIme  (MAXIPIME ) 2 g in sodium chloride  0.9 % 100 mL IVPB 2 g 200 mL/hr   10/27/24 1440 New Bag/Given   metroNIDAZOLE  (FLAGYL ) IVPB 500 mg 500 mg 100 mL/hr        Objective   Blood pressure (!) 117/58, pulse 85, temperature (!) 97 F (36.1 C), temperature source Axillary, resp. rate 11, height 5' 1 (1.549 m), weight 66.6 kg, SpO2 100%.        Intake/Output Summary (Last 24 hours) at 10/30/2024 0902 Last data filed at 10/30/2024 0800 Gross per 24 hour  Intake 2294.58 ml  Output 2388 ml  Net -93.42 ml   Filed Weights   10/28/24 0441 10/29/24 0500 10/30/24 0708  Weight: 61.4 kg 63.4 kg 66.6 kg   GENERAL:NAD age appropriate EYES: Pupils equal, round, reactive to light.  No scleral icterus.  MOUTH: Moist mucosal membrane NECK: Supple.  PULMONARY: Lungs clear to auscultation, +rhonchi, +wheezing CARDIOVASCULAR: S1 and S2.  Regular rate and rhythm GASTROINTESTINAL: Soft, nontender, -distended. Positive bowel sounds. Ileostomy in place MUSCULOSKELETAL: No swelling, clubbing, or edema.  NEUROLOGIC: alert and awake SKIN:normal, warm to touch, Capillary refill delayed  Pulses present bilaterally   Labs/imaging that I havepersonally reviewed  (right click and Reselect all SmartList Selections daily)    ASSESSMENT AND PLAN SYNOPSIS 68 yo white female with multiple medical issues admitted for severe and acute hypovolumic shock GIB from Ileostomy  Hemorrhagic  SHOCK- present on admission SOURCE-GIB -use vasopressors to keep MAP>65 as needed -follow ABG and LA - cultures- negative thus far   2.  ACUTE blood loss ANEMIA- -hx of total colectomy and ileostomy with bleeding from ostomy site at home -GI on case -appreciate input  -TRANSFUSE AS NEEDED IF HGB<7 DVT PRX with TED/SCD's ONLY    3. Chronic CARDIAC dysfunction   - advanced systolic CHF EF 30% and chronic diastolic CHF   -pulmonary  hypertension   - Valvular AF ICU monitoring   4. ACUTE KDIGO 4 on chronic KIDNEY disease  -likely due to ischemia post bleeding -continue Foley Catheter-assess need -Avoid nephrotoxic agents -Follow urine output, BMP -Ensure adequate renal perfusion, optimize oxygenation -Renal dose medications  5. Liver cirrhosis with portal hypertensive disease             - esophageal varices             - recurrent GI bleeding             - coagulopathy            - poor prognosis   Intake/Output Summary (Last 24 hours) at 10/30/2024 0902 Last data filed at 10/30/2024 0800 Gross per 24 hour  Intake 2294.58 ml  Output 2388 ml  Net -93.42 ml       Latest Ref Rng & Units 10/30/2024    3:55 AM 10/29/2024    3:56 PM 10/29/2024    6:19 AM  BMP  Glucose 70 - 99 mg/dL 818  774  838   BUN 8 - 23 mg/dL 13  21  32   Creatinine 0.44 - 1.00 mg/dL 8.42  7.76  6.83   Sodium 135 - 145 mmol/L 133  131  132   Potassium 3.5 - 5.1 mmol/L 3.5  3.4  3.8   Chloride 98 - 111 mmol/L 98  97  95   CO2 22 - 32 mmol/L 26  25  24    Calcium  8.9 - 10.3 mg/dL 8.8  8.1  8.3     ENDO - ICU hypoglycemic\Hyperglycemia protocol -check FSBS per protocol   GI GI PROPHYLAXIS as indicated  NUTRITIONAL STATUS DIET-->NPO Constipation protocol as indicated Gi consulted  ELECTROLYTES -follow labs as needed -replace as needed -pharmacy consultation and following     Best practice (right click and Reselect all SmartList Selections daily)  Diet:  NPO DVT prophylaxis: Contraindicated GI prophylaxis: PPI Mobility:  bed rest  Code Status:  FULL CODE Disposition: ICU  Labs   CBC: Recent Labs  Lab 10/27/24 0908 10/28/24 0156 10/28/24 0431 10/28/24 0746 10/29/24 0619 10/29/24 1133 10/29/24 1736 10/29/24 2305 10/30/24 0355  WBC 7.5  --  9.2  --  5.4  --   --   --  3.6*  NEUTROABS  --   --   --   --  4.5  --   --   --  2.3  HGB 6.2*   < > 7.0*   < > 8.4* 7.6* 7.2* 7.1* 7.1*  HCT 19.7*   < > 21.0*   <  > 24.1* 22.0* 21.4* 20.7* 20.9*  MCV 115.9*  --  100.5*  --  91.6  --   --   --  95.0  PLT 167  --  154  --  66*  --   --   --  40*   < > = values in this interval not displayed.    Basic Metabolic Panel: Recent Labs  Lab 10/27/24 1640 10/28/24 0431 10/28/24 1602 10/29/24 0619 10/29/24 1556 10/30/24 0355  NA 127* 127* 128* 132* 131* 133*  K 5.4* 5.2*  4.5 3.8 3.4* 3.5  CL 100 93* 90* 95* 97* 98  CO2 10* 12* 13* 24 25 26   GLUCOSE 129* 130* 205* 161* 225* 181*  BUN 66* 68* 64* 32* 21 13  CREATININE 7.07* 7.25* 6.25* 3.16* 2.23* 1.57*  CALCIUM  7.7* 7.8* 7.3* 8.3* 8.1* 8.8*  MG 1.7 1.5*  --   --   --  1.7  PHOS  --  5.1* 4.8* 3.2 2.2* 1.6*  1.5*   GFR: Estimated Creatinine Clearance: 29.5 mL/min (A) (by C-G formula based on SCr of 1.57 mg/dL (H)). Recent Labs  Lab 10/27/24 0908 10/27/24 1218 10/28/24 0431 10/28/24 1135 10/28/24 1325 10/28/24 1602 10/28/24 1930 10/29/24 0619 10/30/24 0355  PROCALCITON  --   --   --   --   --  0.34  --   --   --   WBC 7.5  --  9.2  --   --   --   --  5.4 3.6*  LATICACIDVEN  --    < >  --  3.8* 4.1* 4.6* 4.4*  --   --    < > = values in this interval not displayed.    Liver Function Tests: Recent Labs  Lab 10/27/24 0908 10/28/24 0431 10/28/24 0746 10/28/24 1602 10/29/24 0619 10/29/24 1556 10/30/24 0355  AST 40  --  72*  --   --   --   --   ALT 26  --  47*  --   --   --   --   ALKPHOS 120  --  107  --   --   --   --   BILITOT 0.7  --  1.5*  --   --   --   --   PROT 6.4*  --  5.8*  --   --   --   --   ALBUMIN  4.0   < > 3.6 3.4* 3.3* 3.4* 3.6   < > = values in this interval not displayed.   Recent Labs  Lab 10/28/24 0746  LIPASE 230*   No results for input(s): AMMONIA in the last 168 hours.  ABG    Component Value Date/Time   PHART 7.47 (H) 03/01/2024 1055   PCO2ART 40 03/01/2024 1055   PO2ART 112 (H) 03/01/2024 1055   HCO3 29.1 (H) 03/01/2024 1055   ACIDBASEDEF 2.7 (H) 02/29/2024 0952   O2SAT 67.3 08/22/2024 0500      Coagulation Profile: Recent Labs  Lab 10/27/24 0908 10/28/24 1039  INR 1.3* 1.4*    Cardiac Enzymes: No results for input(s): CKTOTAL, CKMB, CKMBINDEX, TROPONINI in the last 168 hours.  HbA1C: Hgb A1c MFr Bld  Date/Time Value Ref Range Status  10/28/2024 06:02 PM 5.5 4.8 - 5.6 % Final    Comment:    (NOTE)         Prediabetes: 5.7 - 6.4         Diabetes: >6.4         Glycemic control for adults with diabetes: <7.0   02/28/2024 06:08 AM 5.9 (H) 4.8 - 5.6 % Final    Comment:    (NOTE) Pre diabetes:          5.7%-6.4%  Diabetes:              >6.4%  Glycemic control for   <7.0% adults with diabetes     CBG: Recent Labs  Lab 10/29/24 1548 10/29/24 1943 10/29/24 2142 10/29/24 2308 10/30/24 0730  GLUCAP 232* 234* 189* 208*  203*    Allergies Allergies  Allergen Reactions   Vioxx [Rofecoxib] Swelling       Critical care provider statement:   Total critical care time: 33 minutes   Performed by: Parris MD   Critical care time was exclusive of separately billable procedures and treating other patients.   Critical care was necessary to treat or prevent imminent or life-threatening deterioration.   Critical care was time spent personally by me on the following activities: development of treatment plan with patient and/or surrogate as well as nursing, discussions with consultants, evaluation of patient's response to treatment, examination of patient, obtaining history from patient or surrogate, ordering and performing treatments and interventions, ordering and review of laboratory studies, ordering and review of radiographic studies, pulse oximetry and re-evaluation of patient's condition.    Johnny Latu, M.D.  Pulmonary & Critical Care Medicine

## 2024-10-30 NOTE — Inpatient Diabetes Management (Signed)
 Inpatient Diabetes Program Recommendations  AACE/ADA: New Consensus Statement on Inpatient Glycemic Control  Target Ranges:  Prepandial:   less than 140 mg/dL      Peak postprandial:   less than 180 mg/dL (1-2 hours)      Critically ill patients:  140 - 180 mg/dL    Latest Reference Range & Units 10/29/24 07:35 10/29/24 11:20 10/29/24 15:48 10/29/24 19:43 10/29/24 21:42 10/29/24 23:08 10/30/24 07:30 10/30/24 11:27  Glucose-Capillary 70 - 99 mg/dL 843 (H) 753 (H) 767 (H) 234 (H) 189 (H) 208 (H) 203 (H) 297 (H)   Review of Glycemic Control  Diabetes history: DM2 Outpatient Diabetes medications: Jardiance  10 mg daily, Metformin  1000 mg BID Current orders for Inpatient glycemic control: Novolog  0-9 units TID with meals, Novolog  0-5 units QHS  Inpatient Diabetes Program Recommendations:    Insulin : CBGs 203-297 mg/dl today.  Please consider ordering Semglee  7 units Q24H.  Thanks, Earnie Gainer, RN, MSN, CDCES Diabetes Coordinator Inpatient Diabetes Program [phone] (Team Pager from 8am to 5pm)

## 2024-10-30 NOTE — Progress Notes (Signed)
 GOALS OF CARE FAMILY CONFERENCE   Current clinical status, hospital findings and medical plan was reviewed with family.   Updated and notified of patients ongoing immediate critical medical problems.   Patient remains critically ill with AF, CHF, Acute blood loss anemia, hx of total colectomy, liver cirrhosis, portal hypertensive disease, CABG, circulatory shock   Patient has consented and agreed to DNR/DNI  Code status   Family are satisfied with Plan of action and management. All questions answered  Additional Critical Care time 35 mins    Halina Picking, M.D.  Pulmonary & Critical Care Medicine  [facility]

## 2024-10-30 NOTE — Plan of Care (Signed)
 Full note to follow. PMT consulted for GOC. Patient confirms DNR/DNI status. Continue dialysis. She states her son would be her surrogate management consultant.

## 2024-10-30 NOTE — Consult Note (Signed)
 Consultation Note Date: 10/30/2024   Patient Name: Tammy Arias  DOB: [birth year]  MRN: [id number]  Age / Sex: 68 y.o., female  PCP: Valora Lynwood FALCON, MD Referring Physician: Parris Manna, MD  Reason for Consultation: {Reason for Consult:23484}  HPI/Patient Profile: 68 y.o. female  with past medical history of *** admitted on 10/27/2024 with ***.   Clinical Assessment and Goals of Care: Notes and labs.  SUMMARY OF RECOMMENDATIONS   *** Code Status/Advance Care Planning: {Palliative Code status:23503}   Symptom Management:  ***  Palliative Prophylaxis:  {Palliative Prophylaxis:21015}  Additional Recommendations (Limitations, Scope, Preferences): {Recommended Scope and Preferences:21019}  Psycho-social/Spiritual:  Desire for further Chaplaincy support:{YES NO:22349} Additional Recommendations: {PAL SOCIAL:21064}  Prognosis:  {Palliative Care Prognosis:23504}  Discharge Planning: {Palliative dispostion:23505}      Primary Diagnoses: Present on Admission:  Hemorrhagic shock (HCC)  Acute renal failure   I have reviewed the medical record, interviewed the patient and family, and examined the patient. The following aspects are pertinent.  Past Medical History:  Diagnosis Date   AC (acromioclavicular) joint bone spurs, right    Anemia    Arthritis    Atrial fibrillation (HCC)    a.) CHA2DS2-VASc = 7 (age, sex, HFrEF, HTN, TIA x 2, T2DM). b.) rate/rhythm controlled on oral carvedilol ; chronically anticoagulated with warfarin.   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 b.) R/LHC 03/29/2017: small RCA with occluded SVG; no significant Dz. Chronically occluded LAD with patent SVG to  D1/LAD. Insignificant Dz in LCx. LM normal.   Cortical cataract    DOE (dyspnea on exertion)    DOE (dyspnea on exertion)    GERD (gastroesophageal reflux disease)    HFrEF (heart failure with reduced ejection fraction) (HCC)    a.) TTE 10/27/2014: EF 40%; glob HK; mild BAE; triv AR, mild MR/PR, mod TR.  b.) TTE 03/15/2017: EF 30%; glob HK; mod BAE; mod pHTN (RVSP 54.8 mmHg); mild PR, mod MR; sev TR.  c.) R/LHC 03/29/2017: EF 30-35%. d.)TTE 10/25/2018: EF 35%; mod BAE; mod BVE; glob HK; triv PR, mod MR, sev TR; RVSP 57.7 mmHg; G2DD.   History of shingles 2004   Hyperlipidemia    Hypertension    Hypothyroidism    IBS (irritable bowel syndrome)    Ischemic cardiomyopathy    a.) TTE 10/27/2014: EF 40%. b.) TTE 03/15/2017: EF 30%. c.) R/LHC 04/01/2017: EF 30-35%. d.) TTE 10/25/2018: EF 35%   Lower GI bleed 06/07/2023   Lumbar scoliosis    Lumbar spinal stenosis    Melena 12/17/2018   Migraines    Osteoporosis    Pancolitis 02/28/2024   Pulmonary HTN (HCC)    a.) TTE 03/15/2017: EF 30-35%; RVSP 54.8 mmHg. 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   Sleep apnea    T2DM (type 2 diabetes mellitus) (HCC)    TIA (transient ischemic attack) 05/29/2016   Vitamin B 12 deficiency    Social History   Socioeconomic History   Marital status: Widowed    Spouse name: Not on file   Number of children: Not on file   Years of education: Not on file   Highest education level: Not on file  Occupational History   Occupation: certified nursing assistant  Tobacco Use   Smoking status: Former    Current packs/day: 0.00    Types: Cigarettes    Quit date: 03/30/1999    Years since quitting: 25.6   Smokeless tobacco: Never  Vaping Use   Vaping status: Never Used  Substance and Sexual Activity   Alcohol use: No   Drug use: No   Sexual activity: Not Currently  Other Topics Concern   Not on file   Social History Narrative   Not on file   Social Drivers of Health   Financial Resource Strain: Medium Risk (05/20/2024)   Received from [facility]   Overall Financial Resource Strain (CARDIA)    Difficulty of Paying Living Expenses: Somewhat hard  Food Insecurity: No Food Insecurity (10/29/2024)   Hunger Vital Sign    Worried About Running Out of Food in the Last Year: Never true    Ran Out of Food in the Last Year: Never true  Transportation Needs: No Transportation Needs (10/29/2024)   PRAPARE - Administrator, Civil Service (Medical): No    Lack of Transportation (Non-Medical): No  Physical Activity: Not on file  Stress: Not on file  Social Connections: Moderately Integrated (10/29/2024)   Social Connection and Isolation Panel    Frequency of Communication with Friends and Family: Three times a week    Frequency of Social Gatherings with Friends and Family: Three times a week    Attends Religious Services: More than 4 times per year    Active Member of Clubs or Organizations: Yes    Attends Banker Meetings: More than 4 times per year    Marital Status: Widowed   Family History  Problem Relation Age of Onset   Valvular heart disease Mother    Diabetes Father    Cancer Sister        lung   Cancer Sister        cervical   Breast cancer Cousin    Scheduled Meds:  alteplase  2 mg Intracatheter Once   Chlorhexidine  Gluconate Cloth  6 each Topical Daily   feeding supplement (GLUCERNA SHAKE)  237 mL Oral TID BM   insulin  aspart  0-5 Units Subcutaneous QHS   insulin  aspart  0-9 Units Subcutaneous TID WC   insulin  glargine-yfgn  7 Units Subcutaneous Daily   midodrine   10 mg Oral TID WC   multivitamin  1 tablet Oral QHS   mupirocin  ointment  1 Application Nasal BID   pantoprazole  (PROTONIX ) IV  40 mg Intravenous Q12H   sodium chloride  flush  3 mL Intravenous Q12H   thiamine   100 mg Oral Daily   Continuous Infusions:   norepinephrine  (LEVOPHED ) Adult infusion 4 mcg/min (10/30/24 1600)   PRN Meds:.acetaminophen , docusate sodium , ondansetron  (ZOFRAN ) IV, mouth rinse, oxyCODONE , polyethylene glycol, sodium chloride  flush, traZODone  Medications Prior to Admission:  Prior to Admission medications   Medication Sig Start Date End  Date Taking? Authorizing Provider  albuterol  (PROVENTIL  HFA;VENTOLIN  HFA) 108 (90 Base) MCG/ACT inhaler Inhale 2 puffs into the lungs every 6 (six) hours as needed for wheezing or shortness of breath.   Yes [provider]  allopurinol  (ZYLOPRIM ) 300 MG tablet Take 300 mg by mouth daily.   Yes [provider]  cyanocobalamin  (VITAMIN B12) 1000 MCG tablet Take 1,000 mcg by mouth daily.   Yes [provider]  empagliflozin  (JARDIANCE ) 10 MG TABS tablet Take 1 tablet by mouth daily. 04/17/24  Yes [provider]  fluticasone  (FLONASE ) 50 MCG/ACT nasal spray Place 2 sprays into the nose daily. 05/20/24  Yes [provider]  iron  polysaccharides (NIFEREX) 150 MG capsule Take 1 capsule (150 mg total) by mouth daily. 08/22/24 11/20/24 Yes Maree Hue, MD  latanoprost  (XALATAN ) 0.005 % ophthalmic solution Place 1 drop into both eyes at bedtime.   Yes [provider]  levothyroxine  (SYNTHROID ) 50 MCG tablet Take 50 mcg by mouth daily before breakfast.   Yes [provider]  magnesium  oxide (MAG-OX) 400 MG tablet Take 400 mg by mouth. Take 1 tablet (400 mg total) by mouth once daily 05/20/24 05/20/25 Yes [provider]  metFORMIN  (GLUCOPHAGE ) 1000 MG tablet Take 1,000 mg by mouth 2 (two) times daily with a meal.   Yes [provider]  nadolol (CORGARD) 20 MG tablet Take 20 mg by mouth daily. 08/12/24 08/12/25 Yes [provider]  pantoprazole  (PROTONIX ) 40 MG tablet Take 40 mg by mouth daily. 01/28/21  Yes [provider]  rosuvastatin  (CRESTOR ) 5 MG tablet Take 5 mg by mouth daily. 02/20/24 02/19/25 Yes [provider]  spironolactone  (ALDACTONE ) 25 MG tablet Take 0.5 tablets (12.5 mg total) by mouth daily. 03/31/24  Yes Fausto Sor A, DO  torsemide  (DEMADEX ) 20 MG tablet Take 1 tablet (20 mg total) by mouth daily as needed (for weight gain or swelling). 03/31/24  Yes Fausto Sor A, DO  aspirin  EC 81 MG tablet Take 81 mg by mouth daily. Swallow whole.    [provider]  Blood Glucose Monitoring Suppl (ACCU-CHEK GUIDE ME) w/Device KIT  08/01/24   [provider]  carvedilol  (COREG ) 3.125 MG tablet Take 3.125 mg by mouth 2 (two) times daily with a meal.    [provider]  phytonadione  (VITAMIN K) 5 MG tablet Take 0.5 tablets (2.5 mg total) by mouth daily for 5 days Patient not taking: Reported on 10/27/2024 08/22/24   Maree Hue, MD   Allergies  Allergen Reactions   Vioxx [Rofecoxib] Swelling   Review of Systems  Physical Exam  Vital Signs: BP (!) 102/43   Pulse 64   Temp (!) 97 F (36.1 C) (Axillary)   Resp 16   Ht 5' 1 (1.549 m)   Wt 66.6 kg   SpO2 100%   BMI 27.74 kg/m  Pain Scale: 0-10   Pain Score: 0-No pain   SpO2: SpO2: 100 % O2 Device:SpO2: 100 % O2 Flow Rate: .O2 Flow Rate (L/min): 2 L/min  IO: Intake/output summary:  Intake/Output Summary (Last 24 hours) at 10/30/2024 1617 Last data filed at 10/30/2024 1600 Gross per 24 hour  Intake 1340.22 ml  Output 1398 ml  Net -57.78 ml    LBM: Last BM Date : 10/30/24 Baseline Weight: Weight: 53.4 kg Most recent weight: Weight: 66.6 kg     Palliative Assessment/Data:     Time In: *** Time Out: *** Time Total: *** Greater than 50%  of this time  was spent counseling and coordinating care related to the above assessment and plan.  Signed by: Camelia Lewis, NP   Please contact Palliative Medicine Team phone at [phone] for questions and concerns.  For individual provider: See Tracey

## 2024-10-30 NOTE — Evaluation (Signed)
 Occupational Therapy Evaluation Patient Details Name: Tammy Arias MRN: [id number] DOB: 1956-09-29 Today's Date: 10/30/2024   History of Present Illness   68 y.o.female with medical problems of heart failure with reduced ejection fraction 25 to 30%, severe mitral regurgitation, moderate right ventricular systolic dysfunction, severe tricuspid regurgitation, atrial fibrillation, failed Watchman device, chronic anticoagulation with apixaban , GI bleed, coronary disease status post CABG in 2005, aortic stenosis repair, pulmonary hypertension, type 2 diabetes, hypertension, liver cirrhosis with grade 2 esophageal varices, C. 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         She reports to ED for generalized malaise and is being admitted for hypotension  +GIB from ileostomy     Clinical Impressions Patient presenting with decreased Ind in self care,balance, functional mobility, transfers, endurance, and safety awareness.  Patient reports being Ind at baseline without use of AD. Pt lives at home alone. When asked if pt had anyone that could assist her at discharge or stay with her she stated no but later told other staff she did. Will need to confirm if she has any assistance or not and who it would be.  Patient currently functioning at min A overall with bed mobility and min guard with ambulation and functional transfers. Pt ambulates ~ 40- 50' secondary to fatigue and reports,  My legs feel like lead. Min A for sit >supine.  Patient will benefit from acute OT to increase overall independence in the areas of ADLs, functional mobility, and safety awareness in order to safely discharge.     If plan is discharge home, recommend the following:   A little help with walking and/or transfers;A little help with bathing/dressing/bathroom;Assistance with cooking/housework;Assist for transportation     Functional Status Assessment   Patient has had a recent  decline in their functional status and demonstrates the ability to make significant improvements in function in a reasonable and predictable amount of time.     Equipment Recommendations   None recommended by OT      Precautions/Restrictions   Precautions Precautions: None;Fall     Mobility Bed Mobility Overal bed mobility: Needs Assistance Bed Mobility: Supine to Sit, Sit to Supine     Supine to sit: Contact guard Sit to supine: Min assist        Transfers Overall transfer level: Needs assistance Equipment used: Rolling walker (2 wheels) Transfers: Sit to/from Stand Sit to Stand: Min assist                  Balance Overall balance assessment: Needs assistance Sitting-balance support: Feet supported Sitting balance-Leahy Scale: Good     Standing balance support: Single extremity supported Standing balance-Leahy Scale: Fair                             ADL either performed or assessed with clinical judgement   ADL Overall ADL's : Needs assistance/impaired                     Lower Body Dressing: Moderate assistance   Toilet Transfer: Minimal assistance Toilet Transfer Details (indicate cue type and reason): simulated         Functional mobility during ADLs: Minimal assistance General ADL Comments: pushing IV pole     Vision Patient Visual Report: No change from baseline              Pertinent Vitals/Pain Pain Assessment Pain  Assessment: 0-10 Pain Score: 5  Pain Location: R groin and b/l LE swelling Pain Descriptors / Indicators: Aching, Discomfort Pain Intervention(s): Limited activity within patient's tolerance, Premedicated before session, Repositioned     Extremity/Trunk Assessment Upper Extremity Assessment Upper Extremity Assessment: Generalized weakness   Lower Extremity Assessment Lower Extremity Assessment: Generalized weakness       Communication Communication Communication: No apparent  difficulties Factors Affecting Communication: Hearing impaired   Cognition Arousal: Alert Behavior During Therapy: WFL for tasks assessed/performed Cognition: No apparent impairments                               Following commands: Intact       Cueing  General Comments   Cueing Techniques: Verbal cues  Pt was able to ambulate into the hallway with good relative confidence using the walker, SpO2 remained in high 90s on room air t/o the effort and she did not have any SOB/DOE           Home Living Family/patient expects to be discharged to:: Private residence Living Arrangements: Alone Available Help at Discharge: Friend(s);Available PRN/intermittently Type of Home: Apartment Home Access: Level entry     Home Layout: One level     Bathroom Shower/Tub: Producer, Television/film/video: Handicapped height     Home Equipment: Agricultural Consultant (2 wheels);Rollator (4 wheels);Cane - quad;Cane - single point;BSC/3in1          Prior Functioning/Environment Prior Level of Function : Independent/Modified Independent;History of Falls (last six months);Driving             Mobility Comments: Ind amb without an AD community distances, one fall in the last 6 months ADLs Comments: Ind with ADLs    OT Problem List: Decreased strength;Decreased activity tolerance;Impaired balance (sitting and/or standing);Decreased safety awareness   OT Treatment/Interventions: Self-care/ADL training;Therapeutic exercise;Patient/family education;Balance training;Energy conservation;Therapeutic activities      OT Goals(Current goals can be found in the care plan section)   Acute Rehab OT Goals Patient Stated Goal: to go homw OT Goal Formulation: With patient Time For Goal Achievement: 11/13/24 Potential to Achieve Goals: Fair ADL Goals Pt Will Perform Grooming: with supervision;standing Pt Will Perform Lower Body Dressing: with supervision;sit to/from stand Pt Will  Transfer to Toilet: with supervision;ambulating Pt Will Perform Toileting - Clothing Manipulation and hygiene: with supervision;sit to/from stand   OT Frequency:  Min 2X/week       AM-PAC OT 6 Clicks Daily Activity     Outcome Measure Help from another person eating meals?: None Help from another person taking care of personal grooming?: A Little Help from another person toileting, which includes using toliet, bedpan, or urinal?: A Little Help from another person bathing (including washing, rinsing, drying)?: A Little Help from another person to put on and taking off regular upper body clothing?: None Help from another person to put on and taking off regular lower body clothing?: A Little 6 Click Score: 20   End of Session Nurse Communication: Mobility status  Activity Tolerance: Patient tolerated treatment well Patient left: in bed;with call bell/phone within reach;with bed alarm set  OT Visit Diagnosis: Unsteadiness on feet (R26.81);Repeated falls (R29.6);Muscle weakness (generalized) (M62.81)                Time: 1355-1420 OT Time Calculation (min): 25 min Charges:  OT General Charges $OT Visit: 1 Visit OT Evaluation $OT Eval Moderate Complexity: 1 Mod OT Treatments $Therapeutic Activity:  8-22 mins  Izetta Claude, MS, OTR/L , CBIS ascom [phone]  10/30/24, 4:15 PM

## 2024-10-30 NOTE — Consult Note (Signed)
 PHARMACY CONSULT NOTE  Pharmacy Consult for Electrolyte Monitoring and Replacement   Recent Labs: Potassium (mmol/L)  Date Value  10/30/2024 3.5   Magnesium  (mg/dL)  Date Value  87/96/7974 1.7   Calcium  (mg/dL)  Date Value  87/96/7974 8.8 (L)   Albumin  (g/dL)  Date Value  87/96/7974 3.6  05/18/2020 4.7   Phosphorus (mg/dL)  Date Value  87/96/7974 1.5 (L)  10/30/2024 1.6 (L)   Sodium (mmol/L)  Date Value  10/30/2024 133 (L)   Assessment: 68 y/o female with h/o ischemic cardiomyopathy, gastric AVM, GI angiodysplasia, IBS-D, IDA, s/p watchman device, cholelithiasis, TIA, hypothyroidism, pulmonary hypertension, GERD, barrett's esophagus, HLD, HTN, OSA, CAD s/p CABG x 3, PAF, CHF, cirrhosis, portal hypertension, gout, CKD III, B12 deficiency, ventral hernia s/p mesh repair 2018, colonic angioectasias, diverticulosis, DDD, hiatal hernia, esophageal varices. Pharmacy is asked to follow and replace electrolytes while in CCU  CRRT stopped 10/30/24  Goal of Therapy:  Electrolytes WNL  Plan:  --Mild hyponatremia. Continue to monitor --Phos 1.5, K Phos  Neutral 500 mg PO q4h x 2 doses --Mg 1.7, magnesium  sulfate 1 g IV x 1 dose --Follow-up electrolytes with AM labs tomorrow  Tammy Arias 10/30/2024 11:05 AM

## 2024-10-30 NOTE — Progress Notes (Addendum)
 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 She reports to ED for generalized malaise and is being admitted for Hemorrhagic shock (HCC) [R57.8] Shock (HCC) [R57.9] Acute GI bleeding [K92.2] Acute renal failure, unspecified acute renal failure type [N17.9]  Patient is known to our practice from previous admissions and has seen Dr Dennise in he office once.   Update: Patient seen and evaluated in ICU Alert and oriented Room air Pressor-Levo  Octreotide  drip  Objective:  Vital signs in last 24 hours:  Temp:  [97 F (36.1 C)-99.4 F (37.4 C)] 97 F (36.1 C) (12/03 0730) Pulse Rate:  [57-111] 77 (12/03 1110) Resp:  [7-27] 11 (12/03 1110) BP: (83-140)/(38-69) 96/59 (12/03 1110) SpO2:  [87 %-100 %] 97 % (12/03 1110) Weight:  [66.6 kg] 66.6 kg (12/03 0708)  Weight change:  Filed Weights   10/28/24 0441 10/29/24 0500 10/30/24 0708  Weight: 61.4 kg 63.4 kg 66.6 kg    Intake/Output: I/O last 3 completed shifts: In: 5229.8 [P.O.:727; I.V.:3654; Blood:325; Other:20; IV Piggyback:503.8] Out: 5241.8 [Stool:950]   Intake/Output this shift:  Total I/O In: 364.6 [I.V.:14.6; Blood:350] Out: 500 [Urine:250; Stool:250]  Physical Exam: General: NAD  Head: Normocephalic, atraumatic. Moist oral mucosa  Eyes: Anicteric  Lungs:  Clear to auscultation, normal effort  Heart: Regular rate and rhythm  Abdomen:   Soft, nontender. Ileostomy   Extremities:  No peripheral edema.  Neurologic: Awake, alert, conversant  Skin: Warm,dry, no rash  Access: Rt groin Trialysis     Basic Metabolic Panel: Recent Labs  Lab 10/27/24 1640 10/28/24 0431 10/28/24 1602 10/29/24 0619 10/29/24 1556 10/30/24 0355  NA 127* 127* 128* 132* 131* 133*  K 5.4* 5.2* 4.5 3.8 3.4* 3.5  CL 100 93* 90* 95* 97* 98  CO2 10* 12* 13* 24 25 26   GLUCOSE 129* 130* 205* 161* 225* 181*  BUN 66* 68* 64* 32* 21 13  CREATININE 7.07* 7.25* 6.25* 3.16* 2.23* 1.57*  CALCIUM  7.7* 7.8* 7.3* 8.3* 8.1* 8.8*  MG 1.7 1.5*  --   --   --  1.7  PHOS  --  5.1* 4.8* 3.2 2.2* 1.6*  1.5*    Liver Function Tests: Recent Labs  Lab 10/27/24 0908 10/28/24 0431 10/28/24 0746 10/28/24 1602 10/29/24 0619 10/29/24 1556 10/30/24 0355  AST 40  --  72*  --   --   --   --   ALT 26  --  47*  --   --   --   --   ALKPHOS 120  --  107  --   --   --   --   BILITOT 0.7  --  1.5*  --   --   --   --   PROT 6.4*  --  5.8*  --   --   --   --   ALBUMIN  4.0   < > 3.6 3.4* 3.3* 3.4* 3.6   < > = values in this interval not displayed.   Recent Labs  Lab 10/28/24 [id number]  LIPASE 230*   No results for input(s): AMMONIA in the last 168 hours.  CBC: Recent Labs  Lab 10/27/24 0908 10/28/24 0156 10/28/24 0431 10/28/24 0746 10/29/24 0619 10/29/24 1133 10/29/24 1736 10/29/24 2305 10/30/24 0355 10/30/24 1100  WBC 7.5  --  9.2  --  5.4  --   --   --  3.6*  --   NEUTROABS  --   --   --   --  4.5  --   --   --  2.3  --   HGB 6.2*   < > 7.0*   < > 8.4* 7.6* 7.2* 7.1* 7.1* 7.8*  HCT 19.7*   < > 21.0*   < > 24.1* 22.0* 21.4* 20.7* 20.9* 23.3*  MCV 115.9*  --  100.5*  --  91.6  --   --   --  95.0  --   PLT 167  --  154  --  66*  --   --   --  40*  --    < > = values in this interval not displayed.    Cardiac Enzymes: No results for input(s): CKTOTAL, CKMB, CKMBINDEX, TROPONINI in the last 168 hours.  BNP: Invalid input(s):  POCBNP  CBG: Recent Labs  Lab 10/29/24 1943 10/29/24 2142 10/29/24 2308 10/30/24 0730 10/30/24 1127  GLUCAP 234* 189* 208* 203* 297*    Microbiology: Results for orders placed or performed during the hospital encounter of 10/27/24  Resp panel by RT-PCR (RSV, Flu A&B, Covid) Anterior Nasal Swab     Status: None   Collection Time: 10/27/24 11:01 AM   Specimen: Anterior Nasal Swab  Result Value Ref Range Status   SARS Coronavirus 2 by RT PCR NEGATIVE NEGATIVE Final    Comment: (NOTE) SARS-CoV-2 target nucleic acids are NOT DETECTED.  The SARS-CoV-2 RNA is generally detectable in upper respiratory specimens during the acute phase of infection. The lowest concentration of SARS-CoV-2 viral copies this assay can detect is 138 copies/mL. A negative result does not preclude SARS-Cov-2 infection and should not be used as the sole basis for treatment or other patient management decisions. A negative result may occur with  improper specimen collection/handling, submission of specimen other than nasopharyngeal swab, presence of viral mutation(s) within the areas targeted by this assay, and inadequate number of viral copies(<138 copies/mL). A negative result must be combined with clinical observations, patient history, and epidemiological information. The expected result is Negative.  Fact Sheet for Patients:  bloggercourse.com  Fact Sheet for Healthcare Providers:  seriousbroker.it  This test is no t yet approved or cleared by the [place]  FDA and  has been authorized for detection and/or diagnosis of SARS-CoV-2 by FDA under an Emergency Use Authorization (EUA). This EUA will remain  in effect (meaning this test can be used) for the duration of the COVID-19 declaration under Section 564(b)(1) of the Act, 21 U.S.C.section 360bbb-3(b)(1), unless the authorization is terminated  or revoked sooner.       Influenza A by PCR  NEGATIVE NEGATIVE Final   Influenza B by PCR NEGATIVE NEGATIVE Final    Comment: (NOTE) The Xpert Xpress SARS-CoV-2/FLU/RSV plus assay is intended as an aid in the diagnosis of influenza from Nasopharyngeal swab specimens and should not be used as a sole basis for treatment. Nasal washings and aspirates are unacceptable for Xpert Xpress SARS-CoV-2/FLU/RSV testing.  Fact Sheet for Patients: bloggercourse.com  Fact Sheet for Healthcare Providers: seriousbroker.it  This test is not yet approved or cleared by the  [place]  FDA and has been authorized for detection and/or diagnosis of SARS-CoV-2 by FDA under an Emergency Use Authorization (EUA). This EUA will remain in effect (meaning this test can be used) for the duration of the COVID-19 declaration under Section 564(b)(1) of the Act, 21 U.S.C. section 360bbb-3(b)(1), unless the authorization is terminated or revoked.     Resp Syncytial Virus by PCR NEGATIVE NEGATIVE Final    Comment: (NOTE) Fact Sheet for Patients: bloggercourse.com  Fact Sheet for Healthcare Providers: seriousbroker.it  This test is not yet approved or cleared by the [place]  FDA and has been authorized for detection and/or diagnosis of SARS-CoV-2 by FDA under an Emergency Use Authorization (EUA). This EUA will remain in effect (meaning this test can be used) for the duration of the COVID-19 declaration under Section 564(b)(1) of the Act, 21 U.S.C. section 360bbb-3(b)(1), unless the authorization is terminated or revoked.  Performed at [facility], [address], [place], [place] [address]   Blood Culture (routine x 2)     Status: None (Preliminary result)   Collection Time: 10/27/24 12:18 PM   Specimen: Right Antecubital; Blood  Result Value Ref Range Status   Specimen Description RIGHT ANTECUBITAL  Final   Special Requests   Final     BOTTLES DRAWN AEROBIC AND ANAEROBIC Blood Culture adequate volume   Culture   Final    NO GROWTH 3 DAYS Performed at [facility], [address] [place], [place] [address]    Report Status PENDING  Incomplete  Blood Culture (routine x 2)     Status: None (Preliminary result)   Collection Time: 10/27/24 12:18 PM   Specimen: Left Antecubital; Blood  Result Value Ref Range Status   Specimen Description LEFT ANTECUBITAL  Final   Special Requests   Final    BOTTLES DRAWN AEROBIC AND ANAEROBIC Blood Culture results may not be optimal due to an inadequate volume of blood received in culture bottles   Culture   Final    NO GROWTH 3 DAYS Performed at [facility], [address], [place], [place] [address]    Report Status PENDING  Incomplete  MRSA Next Gen by PCR, Nasal     Status: Abnormal   Collection Time: 10/27/24  1:38 PM   Specimen: Nasal Mucosa; Nasal Swab  Result Value Ref Range Status   MRSA by PCR Next Gen DETECTED (A) NOT DETECTED Final    Comment: RESULT CALLED TO, READ BACK BY AND VERIFIED WITH: KATHERINE CLAYTON @1520  10/27/24 MJU (NOTE) The GeneXpert MRSA Assay (FDA approved for NASAL specimens only), is one component of a comprehensive MRSA colonization surveillance program. It is not intended to diagnose MRSA infection nor to guide or monitor treatment for MRSA infections. Test performance is not FDA approved in patients less than 41 years old. Performed at [facility], [address] [place], [place] [address]     Coagulation Studies: Recent Labs    10/28/24 1039  LABPROT 18.0*  INR 1.4*    Urinalysis: No results for input(s): COLORURINE, LABSPEC, PHURINE, GLUCOSEU, HGBUR, BILIRUBINUR, KETONESUR, PROTEINUR, UROBILINOGEN, NITRITE, LEUKOCYTESUR in the last 72 hours.  Invalid input(s): APPERANCEUR    Imaging: CT ABDOMEN PELVIS WO CONTRAST Result Date: 10/28/2024 CLINICAL DATA:  Sepsis. EXAM: CT  ABDOMEN AND PELVIS WITHOUT CONTRAST TECHNIQUE: Multidetector CT imaging of the abdomen and pelvis was performed following the standard protocol without IV contrast. RADIATION DOSE REDUCTION: This exam was performed according to the departmental dose-optimization program which includes  automated exposure control, adjustment of the mA and/or kV according to patient size and/or use of iterative reconstruction technique. COMPARISON:  08/21/2024 FINDINGS: Lower chest: Trace bilateral pleural effusions. Hepatobiliary: Gallstones. No significant gallbladder distension but limited evaluation of the gallbladder. No acute abnormality involving the liver. Small amount of perihepatic ascites. Pancreas: Unremarkable. No pancreatic ductal dilatation or surrounding inflammatory changes. Spleen: Normal in size without focal abnormality. Adrenals/Urinary Tract: Adrenal glands within normal limits. Again noted are small hyperdense structures in the kidneys are too small to definitively characterize. No definite stones or hydronephrosis. Small amount of fluid in the urinary bladder. Stomach/Bowel: History of colectomy with a Hartmann's pouch. Normal appearance of the stomach. No acute abnormality involving the ileostomy. Limited evaluation of the small bowel structures due to the minimal oral contrast. Difficult to exclude areas of small bowel wall thickening in the left abdomen. Mild mesenteric edema. Vascular/Lymphatic: Atherosclerotic calcifications in the abdominal aorta without aneurysm. Central line in the right common femoral vein that terminates near the junction of the IVC and right common iliac vein. No significant lymph node enlargement in the abdomen or pelvis. Reproductive: Limited evaluation but no gross abnormality to the uterus or adnexal structures. Other: Again noted is free fluid in the pelvis and small amount of upper abdominal ascites. Small amount of free fluid in left lower quadrant. Diffuse subcutaneous edema.  Right paracentral ventral hernia in the upper abdomen contains a small amount of fluid and similar to the previous examination. Musculoskeletal: Scoliosis in the thoracolumbar spine. No acute bone abnormality. IMPRESSION: 1. Small amount of ascites in the abdomen and pelvis and similar to the exam on 08/21/2024. 2. Trace bilateral pleural effusions. 3. Cholelithiasis.  Limited evaluation of the gallbladder. 4. Small hyperdense renal structures that are too small to definitively characterize and recommend attention on follow-up. 5. Limited evaluation of the bowel structures with an ileostomy. No evidence for a bowel obstruction. 6. Aortic Atherosclerosis (ICD10-I70.0). Electronically Signed   By: Juliene Balder M.D.   On: 10/28/2024 15:22     Medications:    norepinephrine  (LEVOPHED ) Adult infusion 8 mcg/min (10/30/24 1109)    alteplase  2 mg Intracatheter Once   Chlorhexidine  Gluconate Cloth  6 each Topical Daily   feeding supplement (GLUCERNA SHAKE)  237 mL Oral TID BM   insulin  aspart  0-5 Units Subcutaneous QHS   insulin  aspart  0-9 Units Subcutaneous TID WC   midodrine   10 mg Oral TID WC   multivitamin  1 tablet Oral QHS   mupirocin  ointment  1 Application Nasal BID   pantoprazole  (PROTONIX ) IV  40 mg Intravenous Q12H   phosphorus  500 mg Oral Q4H   sodium chloride  flush  3 mL Intravenous Q12H   thiamine   100 mg Oral Daily   acetaminophen , docusate sodium , ondansetron  (ZOFRAN ) IV, mouth rinse, oxyCODONE , polyethylene glycol, sodium chloride  flush, traZODone   Assessment/ Plan:  Tammy Arias is a 68 y.o.  female presents to ED with weakness and has been admitted for Hemorrhagic shock (HCC) [R57.8] Shock (HCC) [R57.9] Acute GI bleeding [K92.2] Acute renal failure, unspecified acute renal failure type [N17.9]   Acute Kidney Injury with hyperkalemia, with baseline creatinine 0.92 on 08/22/24.  Acute kidney injury appears multifactorial at this time from hypotension and dehydration.  Potassium 6.1 on admission. Corrected some with shifting measures. CRRT initiated on 12/1.   Labs have corrected with CRRT and has been weaned to one pressor. Will stop CRRT today. Very little urine output. Patient encouraged to  increase oral intake. If no urine output by noon, nursing will bladder scan. Will plan for next dialysis treatment tomorrow.   Lab Results  Component Value Date   CREATININE 1.57 (H) 10/30/2024   CREATININE 2.23 (H) 10/29/2024   CREATININE 3.16 (H) 10/29/2024    Intake/Output Summary (Last 24 hours) at 10/30/2024 1157 Last data filed at 10/30/2024 1056 Gross per 24 hour  Intake 1917.36 ml  Output 2388 ml  Net -470.64 ml    2. Acute metabolic acidosis, S bicarb 9 on ED arrival. S bicarb has corrected with CRRT  3. Anemia with suspected acute blood loss Lab Results  Component Value Date   HGB 7.8 (L) 10/30/2024  Hgb remains decreased. Received a blood transfusion yesterday. Octreotide  infusion stopped. GI monitor for now.   4. Hypotension, hemorrhagic shock. Blood pressure managed with Levo   LOS: 3 Keisuke Hollabaugh 12/3/202511:57 AM

## 2024-10-30 NOTE — Progress Notes (Addendum)
 NP notified that CRRT is not running. HD catheter has clotted and Alteplase to be instilled. NP at bedside.

## 2024-10-30 NOTE — Evaluation (Signed)
 Physical Therapy Evaluation Patient Details Name: Tammy Arias MRN: [id number] DOB: Jul 04, 1956 Today's Date: 10/30/2024  History of Present Illness  68 y.o.female with medical problems of heart failure with reduced ejection fraction 25 to 30%, severe mitral regurgitation, moderate right ventricular systolic dysfunction, severe tricuspid regurgitation, atrial fibrillation, failed Watchman device, chronic anticoagulation with apixaban , GI bleed, coronary disease status post CABG in 2005, aortic stenosis repair, pulmonary hypertension, type 2 diabetes, hypertension, liver cirrhosis with grade 2 esophageal varices, C. 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         She reports to ED for generalized malaise and is being admitted for hypotension  +GIB from ileostomy  Clinical Impression  Pt initially reports not wanting to do a lot of activity having walked with OT earlier this afternoon and being quite fatigued.  However with offer to use the walker she was willing to try and was able to ambulate with greatly increased confidence for more than twice as much distance.  Pt reports that she does use her 4WW at home when not feeling well.  Pt showed good overall effort, will benefit from continued PT to address functional limitations.       If plan is discharge home, recommend the following: Assistance with cooking/housework;Assist for transportation;Help with stairs or ramp for entrance;A little help with walking and/or transfers;A little help with bathing/dressing/bathroom   Can travel by private vehicle        Equipment Recommendations    Recommendations for Other Services       Functional Status Assessment Patient has had a recent decline in their functional status and demonstrates the ability to make significant improvements in function in a reasonable and predictable amount of time.     Precautions / Restrictions Precautions Precautions:  None Restrictions Weight Bearing Restrictions Per Provider Order: No      Mobility  Bed Mobility Overal bed mobility: Needs Assistance Bed Mobility: Supine to Sit, Sit to Supine     Supine to sit: Contact guard Sit to supine: Mod assist   General bed mobility comments: Pt was able to get herself up to sitting EOB w/o direct assist, did need help getting LEs back up into bed    Transfers Overall transfer level: Needs assistance Equipment used: Rolling walker (2 wheels) Transfers: Sit to/from Stand Sit to Stand: Supervision           General transfer comment: Pt able to transition up to standing w/o hesitation, good recall of positioning  and UE use to safely return to sitting.  Able to rise and stabilize w/o the use of walker but clearly more confident/safe with it.    Ambulation/Gait Ambulation/Gait assistance: Contact guard assist Gait Distance (Feet): 100 Feet Assistive device: Rolling walker (2 wheels)         General Gait Details: Pt had walked earlier this afternoon without AD, did better with the walker today 2/2 reports R LE pain/swelling  Stairs            Wheelchair Mobility     Tilt Bed    Modified Rankin (Stroke Patients Only)       Balance Overall balance assessment: Modified Independent                                           Pertinent Vitals/Pain Pain Assessment Pain Assessment: 0-10  Pain Score: 5  Pain Location: R groin and b/l LE swelling    Home Living Family/patient expects to be discharged to:: Private residence Living Arrangements: Alone Available Help at Discharge: Friend(s);Available PRN/intermittently Type of Home: Apartment Home Access: Level entry       Home Layout: One level Home Equipment: Agricultural Consultant (2 wheels);Rollator (4 wheels);Cane - quad;Cane - single point;BSC/3in1      Prior Function Prior Level of Function : Independent/Modified Independent;History of Falls (last six  months);Driving             Mobility Comments: Ind amb without an AD community distances, one fall in the last 6 months ADLs Comments: Ind with ADLs     Extremity/Trunk Assessment   Upper Extremity Assessment Upper Extremity Assessment: Generalized weakness    Lower Extremity Assessment Lower Extremity Assessment: Generalized weakness       Communication   Communication Communication: No apparent difficulties Factors Affecting Communication: Hearing impaired    Cognition Arousal: Alert Behavior During Therapy: WFL for tasks assessed/performed   PT - Cognitive impairments: No apparent impairments                         Following commands: Intact       Cueing Cueing Techniques: Verbal cues     General Comments General comments (skin integrity, edema, etc.): Pt was able to ambulate into the hallway with good relative confidence using the walker, SpO2 remained in high 90s on room air t/o the effort and she did not have any SOB/DOE    Exercises     Assessment/Plan    PT Assessment Patient needs continued PT services  PT Problem List Decreased strength;Decreased activity tolerance;Decreased balance;Decreased mobility;Pain;Decreased safety awareness;Cardiopulmonary status limiting activity       PT Treatment Interventions DME instruction;Gait training;Stair training;Functional mobility training;Therapeutic activities;Therapeutic exercise;Balance training;Neuromuscular re-education;Patient/family education    PT Goals (Current goals can be found in the Care Plan section)  Acute Rehab PT Goals Patient Stated Goal: Go home (and not rehab) PT Goal Formulation: With patient Time For Goal Achievement: 11/12/24 Potential to Achieve Goals: Good    Frequency Min 2X/week     Co-evaluation               AM-PAC PT 6 Clicks Mobility  Outcome Measure Help needed turning from your back to your side while in a flat bed without using bedrails?:  None Help needed moving from lying on your back to sitting on the side of a flat bed without using bedrails?: A Little Help needed moving to and from a bed to a chair (including a wheelchair)?: A Little Help needed standing up from a chair using your arms (e.g., wheelchair or bedside chair)?: A Little Help needed to walk in hospital room?: A Little Help needed climbing 3-5 steps with a railing? : A Little 6 Click Score: 19    End of Session Equipment Utilized During Treatment: Gait belt Activity Tolerance: Patient tolerated treatment well Patient left: with call bell/phone within reach Nurse Communication: Mobility status PT Visit Diagnosis: Muscle weakness (generalized) (M62.81);Difficulty in walking, not elsewhere classified (R26.2)    Time: 8558-8496 PT Time Calculation (min) (ACUTE ONLY): 22 min   Charges:   PT Evaluation $PT Eval Low Complexity: 1 Low PT Treatments $Gait Training: 8-22 mins PT General Charges $$ ACUTE PT VISIT: 1 Visit         Carmin JONELLE Deed, DPT 10/30/2024, 4:08 PM

## 2024-10-31 ENCOUNTER — Inpatient Hospital Stay

## 2024-10-31 DIAGNOSIS — R578 Other shock: Secondary | ICD-10-CM | POA: Diagnosis not present

## 2024-10-31 LAB — HEMOGLOBIN AND HEMATOCRIT, BLOOD
HCT: 25.4 % — ABNORMAL LOW (ref 36.0–46.0)
HCT: 24.4 % — ABNORMAL LOW (ref 36.0–46.0)
HCT: 23.3 % — ABNORMAL LOW (ref 36.0–46.0)
Hemoglobin: 8.5 g/dL — ABNORMAL LOW (ref 12.0–15.0)
Hemoglobin: 7.5 g/dL — ABNORMAL LOW (ref 12.0–15.0)
Hemoglobin: 7.7 g/dL — ABNORMAL LOW (ref 12.0–15.0)

## 2024-10-31 LAB — TYPE AND SCREEN
ABO/RH(D): A NEG
Antibody Screen: POSITIVE
Unit division: 0
Unit division: 0
Unit division: 0
Unit division: 0
Unit division: 0
Unit division: 0
Unit division: 0
Unit division: 0
Unit division: 0
Unit division: 0
Unit division: 0
Unit division: 0
Unit division: 0
Unit division: 0
Unit division: 0
Unit division: 0
Unit division: 0
Unit division: 0
Unit division: 0

## 2024-10-31 LAB — BPAM RBC
Blood Product Expiration Date: 202512192359
Blood Product Expiration Date: 202601072359
Blood Product Expiration Date: 202601072359
Blood Product Expiration Date: 202601102359
Blood Product Expiration Date: 202601102359
Blood Product Unit Number: 202512202359
Blood Product Unit Number: 202512282359
ISSUE DATE / TIME: 202512011111
ISSUE DATE / TIME: 202512232359
PRODUCT CODE: 202601042359
PRODUCT CODE: 202512020233
PRODUCT CODE: 202512212359
PRODUCT CODE: 202512232359
PRODUCT CODE: 202512232359
Unit Type and Rh: 600
Unit Type and Rh: 600
Unit Type and Rh: 600
Unit Type and Rh: 9500
Unit Type and Rh: 9500
Unit Type and Rh: 202512030415
Unit Type and Rh: 202512192359
Unit Type and Rh: 202601042359
Unit Type and Rh: 9500
Unit Type and Rh: 202512020233
Unit Type and Rh: 202512232359
Unit Type and Rh: 202512232359
Unit Type and Rh: 202512282359
Unit Type and Rh: 600
Unit Type and Rh: 600
Unit Type and Rh: 600
Unit Type and Rh: 600

## 2024-10-31 LAB — RENAL FUNCTION PANEL
Albumin: 3.3 g/dL — ABNORMAL LOW (ref 3.5–5.0)
Anion gap: 8 (ref 5–15)
BUN: 19 mg/dL (ref 8–23)
CO2: 25 mmol/L (ref 22–32)
Calcium: 7.5 mg/dL — ABNORMAL LOW (ref 8.9–10.3)
Chloride: 93 mmol/L — ABNORMAL LOW (ref 98–111)
Creatinine, Ser: 2.07 mg/dL — ABNORMAL HIGH (ref 0.44–1.00)
GFR, Estimated: 25 mL/min — ABNORMAL LOW (ref >60)
Glucose, Bld: 184 mg/dL — ABNORMAL HIGH (ref 70–99)
Phosphorus: 3.4 mg/dL (ref 2.5–4.6)
Potassium: 5 mmol/L (ref 3.5–5.1)
Sodium: 126 mmol/L — ABNORMAL LOW (ref 135–145)

## 2024-10-31 LAB — GLUCOSE, CAPILLARY
Glucose-Capillary: 176 mg/dL — ABNORMAL HIGH (ref 70–99)
Glucose-Capillary: 207 mg/dL — ABNORMAL HIGH (ref 70–99)
Glucose-Capillary: 176 mg/dL — ABNORMAL HIGH (ref 70–99)
Glucose-Capillary: 212 mg/dL — ABNORMAL HIGH (ref 70–99)

## 2024-10-31 LAB — CBC
HCT: 23 % — ABNORMAL LOW (ref 36.0–46.0)
Hemoglobin: 7.8 g/dL — ABNORMAL LOW (ref 12.0–15.0)
MCH: 31 pg (ref 26.0–34.0)
MCHC: 33.9 g/dL (ref 30.0–36.0)
MCV: 91.3 fL (ref 80.0–100.0)
Platelets: 41 K/uL — ABNORMAL LOW (ref 150–400)
RBC: 2.52 MIL/uL — ABNORMAL LOW (ref 3.87–5.11)
RDW: 23.4 % — ABNORMAL HIGH (ref 11.5–15.5)
WBC: 4.9 K/uL (ref 4.0–10.5)
nRBC: 2 % — ABNORMAL HIGH (ref 0.0–0.2)

## 2024-10-31 LAB — VITAMIN K1, SERUM: VITAMIN K1: <0.1 ng/mL — ABNORMAL LOW (ref 0.10–2.20)

## 2024-10-31 LAB — MAGNESIUM: Magnesium: 1.7 mg/dL (ref 1.7–2.4)

## 2024-10-31 MED ORDER — IPRATROPIUM-ALBUTEROL 0.5-2.5 (3) MG/3ML IN SOLN
3.0000 mL | RESPIRATORY_TRACT | Status: DC | PRN
  Administered 2024-10-31 – 2024-11-02 (×3): 3 mL via RESPIRATORY_TRACT
  Filled 2024-10-31 (×3): qty 3

## 2024-10-31 MED ORDER — PHYTONADIONE 5 MG PO TABS
2.5000 mg | ORAL_TABLET | Freq: Every day | ORAL | Status: DC
  Administered 2024-10-31 – 2024-11-18 (×19): 2.5 mg via ORAL
  Filled 2024-10-31 (×19): qty 1

## 2024-10-31 MED ORDER — MAGNESIUM SULFATE IN D5W 1-5 GM/100ML-% IV SOLN
1.0000 g | Freq: Once | INTRAVENOUS | Status: AC
  Administered 2024-10-31: 1 g via INTRAVENOUS
  Filled 2024-10-31: qty 100

## 2024-10-31 MED ORDER — SODIUM CHLORIDE 1 G PO TABS
2.0000 g | ORAL_TABLET | Freq: Once | ORAL | Status: AC
  Administered 2024-10-31: 2 g via ORAL
  Filled 2024-10-31: qty 2

## 2024-10-31 MED ORDER — EPOETIN ALFA-EPBX 10000 UNIT/ML IJ SOLN
10000.0000 [IU] | Freq: Once | INTRAMUSCULAR | Status: AC
  Administered 2024-10-31: 10000 [IU] via INTRAVENOUS
  Filled 2024-10-31: qty 1

## 2024-10-31 NOTE — Care Management Important Message (Signed)
 Important Message  Patient Details  Name: Tammy Arias MRN: [id number] Date of Birth: 11/30/1955   Important Message Given:  Yes - Medicare IM     Aarionna Germer W, CMA 10/31/2024, 10:58 AM

## 2024-10-31 NOTE — Progress Notes (Signed)
 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 She reports to ED for generalized malaise and is being admitted for Hemorrhagic shock (HCC) [R57.8] Shock (HCC) [R57.9] Acute GI bleeding [K92.2] Acute renal failure, unspecified acute renal failure type [N17.9]  Patient is known to our practice from previous admissions and has seen Dr Dennise in he office once.   Update: Patient seen and evaluated in ICU Alert and oriented Room air Lower extremity edema mildly worse today Pressor-Levo  Octreotide  drip  Objective:  Vital signs in last 24 hours:  Temp:  [97.7 F (36.5 C)-98.9 F (37.2 C)] 97.9 F (36.6 C) (12/04 0800) Pulse Rate:  [37-106] 76 (12/04 1215) Resp:  [9-23] 17 (12/04 1215) BP: (84-116)/(31-74) 95/59 (12/04 1251) SpO2:  [94 %-100 %] 99 % (12/04 1215)  Weight change:  Filed Weights   10/28/24 0441 10/29/24 0500 10/30/24 0708  Weight: 61.4 kg 63.4 kg 66.6 kg    Intake/Output: I/O last 3 completed shifts: In: 1704.9 [P.O.:337; I.V.:498.9; Blood:350; Other:20; IV Piggyback:499.1] Out: 2183 [Urine:400; Stool:1475]   Intake/Output this shift:  Total I/O In: 238.2 [I.V.:22.3; IV Piggyback:215.9] Out: 200 [Urine:150; Stool:50]  Physical Exam: General: NAD  Head: Normocephalic, atraumatic. Moist oral mucosa  Eyes: Anicteric  Lungs:  Clear to auscultation, normal effort  Heart: Regular rate  and rhythm  Abdomen:  Soft, nontender. Ileostomy   Extremities:  No peripheral edema.  Neurologic: Awake, alert, conversant  Skin: Warm,dry, no rash  Access: Rt groin Trialysis     Basic Metabolic Panel: Recent Labs  Lab 10/27/24 1640 10/27/24 1640 10/28/24 0431 10/28/24 1602 10/29/24 0619 10/29/24 1556 10/30/24 0355 10/30/24 2232 10/31/24 0419  NA 127*  --  127* 128* 132* 131* 133*  --  126*  K 5.4*  --  5.2* 4.5 3.8 3.4* 3.5  --  5.0  CL 100  --  93* 90* 95* 97* 98  --  93*  CO2 10*  --  12* 13* 24 25 26   --  25  GLUCOSE 129*  --  130* 205* 161* 225* 181*  --  184*  BUN 66*  --  68* 64* 32* 21 13  --  19  CREATININE 7.07*  --  7.25* 6.25* 3.16* 2.23* 1.57*  --  2.07*  CALCIUM  7.7*  --  7.8* 7.3* 8.3* 8.1* 8.8*  --  7.5*  MG 1.7  --  1.5*  --   --   --  1.7  --  1.7  PHOS  --    < > 5.1* 4.8* 3.2 2.2* 1.6*  1.5* <1.0* 3.4   < > = values in this interval not displayed.    Liver Function Tests: Recent Labs  Lab 10/27/24 0908 10/28/24 0431 10/28/24 0746 10/28/24 1602 10/29/24 0619 10/29/24 1556 10/30/24 0355 10/31/24 0419  AST 40  --  72*  --   --   --   --   --   ALT 26  --  47*  --   --   --   --   --  ALKPHOS 120  --  107  --   --   --   --   --   BILITOT 0.7  --  1.5*  --   --   --   --   --   PROT 6.4*  --  5.8*  --   --   --   --   --   ALBUMIN  4.0   < > 3.6 3.4* 3.3* 3.4* 3.6 3.3*   < > = values in this interval not displayed.   Recent Labs  Lab 10/28/24 0746  LIPASE 230*   No results for input(s): AMMONIA in the last 168 hours.  CBC: Recent Labs  Lab 10/27/24 0908 10/28/24 0156 10/28/24 0431 10/28/24 0746 10/29/24 0619 10/29/24 1133 10/30/24 0355 10/30/24 1100 10/30/24 1628 10/30/24 2232 10/31/24 0419 10/31/24 0819 10/31/24 1138  WBC 7.5  --  9.2  --  5.4  --  3.6*  --   --   --  4.9  --   --   NEUTROABS  --   --   --   --  4.5  --  2.3  --   --   --   --   --   --   HGB 6.2*   < > 7.0*   < > 8.4*   < > 7.1*   < > 7.7* 7.3* 7.8*  8.5* 7.5*  HCT 19.7*   < > 21.0*   < > 24.1*   < > 20.9*   < > 23.1* 22.5* 23.0* 25.4* 24.4*  MCV 115.9*  --  100.5*  --  91.6  --  95.0  --   --   --  91.3  --   --   PLT 167  --  154  --  66*  --  40*  --   --   --  41*  --   --    < > = values in this interval not displayed.    Cardiac Enzymes: No results for input(s): CKTOTAL, CKMB, CKMBINDEX, TROPONINI in the last 168 hours.  BNP: Invalid input(s): POCBNP  CBG: Recent Labs  Lab 10/30/24 1626 10/30/24 1944 10/30/24 2121 10/31/24 0721 10/31/24 1119  GLUCAP 173* 209* 157* 176* 207*    Microbiology: Results for orders placed or performed during the hospital encounter of 10/27/24  Resp panel by RT-PCR (RSV, Flu A&B, Covid) Anterior Nasal Swab     Status: None   Collection Time: 10/27/24 11:01 AM   Specimen: Anterior Nasal Swab  Result Value Ref Range Status   SARS Coronavirus 2 by RT PCR NEGATIVE NEGATIVE Final    Comment: (NOTE) SARS-CoV-2 target nucleic acids are NOT DETECTED.  The SARS-CoV-2 RNA is generally detectable in upper respiratory specimens during the acute phase of infection. The lowest concentration of SARS-CoV-2 viral copies this assay can detect is 138 copies/mL. A negative result does not preclude SARS-Cov-2 infection and should not be used as the sole basis for treatment or other patient management decisions. A negative result may occur with  improper specimen collection/handling, submission of specimen other than nasopharyngeal swab, presence of viral mutation(s) within the areas targeted by this assay, and inadequate number of viral copies(<138 copies/mL). A negative result must be combined with clinical observations, patient history, and epidemiological information. The expected result is Negative.  Fact Sheet for Patients:  bloggercourse.com  Fact Sheet for Healthcare Providers:  seriousbroker.it  This test is no t yet approved or  cleared by the [place]  [place] FDA and  has been authorized for detection and/or diagnosis of SARS-CoV-2 by FDA under an Emergency Use Authorization (EUA). This EUA will remain  in effect (meaning this test can be used) for the duration of the COVID-19 declaration under Section 564(b)(1) of the Act, 21 U.S.C.section 360bbb-3(b)(1), unless the authorization is terminated  or revoked sooner.       Influenza A by PCR NEGATIVE NEGATIVE Final   Influenza B by PCR NEGATIVE NEGATIVE Final    Comment: (NOTE) The Xpert Xpress SARS-CoV-2/FLU/RSV plus assay is intended as an aid in the diagnosis of influenza from Nasopharyngeal swab specimens and should not be used as a sole basis for treatment. Nasal washings and aspirates are unacceptable for Xpert Xpress SARS-CoV-2/FLU/RSV testing.  Fact Sheet for Patients: bloggercourse.com  Fact Sheet for Healthcare Providers: seriousbroker.it  This test is not yet approved or cleared by the [place]  FDA and has been authorized for detection and/or diagnosis of SARS-CoV-2 by FDA under an Emergency Use Authorization (EUA). This EUA will remain in effect (meaning this test can be used) for the duration of the COVID-19 declaration under Section 564(b)(1) of the Act, 21 U.S.C. section 360bbb-3(b)(1), unless the authorization is terminated or revoked.     Resp Syncytial Virus by PCR NEGATIVE NEGATIVE Final    Comment: (NOTE) Fact Sheet for Patients: bloggercourse.com  Fact Sheet for Healthcare Providers: seriousbroker.it  This test is not yet approved or cleared by the [place]  FDA and has been authorized for detection and/or diagnosis of SARS-CoV-2 by FDA under an Emergency Use Authorization (EUA). This EUA will remain in effect (meaning this test can be used) for the duration of the COVID-19 declaration under Section 564(b)(1) of the Act, 21  U.S.C. section 360bbb-3(b)(1), unless the authorization is terminated or revoked.  Performed at [facility], [address] [place], [place] [address]   Blood Culture (routine x 2)     Status: None (Preliminary result)   Collection Time: 10/27/24 12:18 PM   Specimen: Right Antecubital; Blood  Result Value Ref Range Status   Specimen Description RIGHT ANTECUBITAL  Final   Special Requests   Final    BOTTLES DRAWN AEROBIC AND ANAEROBIC Blood Culture adequate volume   Culture   Final    NO GROWTH 3 DAYS Performed at [facility], [address], [place], [place] [address]    Report Status PENDING  Incomplete  Blood Culture (routine x 2)     Status: None (Preliminary result)   Collection Time: 10/27/24 12:18 PM   Specimen: Left Antecubital; Blood  Result Value Ref Range Status   Specimen Description LEFT ANTECUBITAL  Final   Special Requests   Final    BOTTLES DRAWN AEROBIC AND ANAEROBIC Blood Culture results may not be optimal due to an inadequate volume of blood received in culture bottles   Culture   Final    NO GROWTH 3 DAYS Performed at [facility], [address], [place], [place] [address]    Report Status PENDING  Incomplete  MRSA Next Gen by PCR, Nasal     Status: Abnormal   Collection Time: 10/27/24  1:38 PM   Specimen: Nasal Mucosa; Nasal Swab  Result Value Ref Range Status   MRSA by PCR Next Gen DETECTED (A) NOT DETECTED Final    Comment: RESULT CALLED TO, READ BACK BY AND VERIFIED WITH: KATHERINE CLAYTON @1520  10/27/24 MJU (NOTE) The GeneXpert MRSA Assay (FDA approved for NASAL specimens only), is one component of  a comprehensive MRSA colonization surveillance program. It is not intended to diagnose MRSA infection nor to guide or monitor treatment for MRSA infections. Test performance is not FDA approved in patients less than 51 years old. Performed at [facility], [address] [place], [place] [address]      Coagulation Studies: No results for input(s): LABPROT, INR in the last 72 hours.   Urinalysis: Recent Labs    10/30/24 0514  COLORURINE AMBER*  LABSPEC 1.016  PHURINE 5.0  GLUCOSEU 150*  HGBUR LARGE*  BILIRUBINUR NEGATIVE  KETONESUR NEGATIVE  PROTEINUR 100*  NITRITE NEGATIVE  LEUKOCYTESUR LARGE*      Imaging: No results found.    Medications:    norepinephrine  (LEVOPHED ) Adult infusion Stopped (10/31/24 1148)    alteplase  2 mg Intracatheter Once   Chlorhexidine  Gluconate Cloth  6 each Topical Daily   feeding supplement (GLUCERNA SHAKE)  237 mL Oral TID BM   insulin  aspart  0-5 Units Subcutaneous QHS   insulin  aspart  0-9 Units Subcutaneous TID WC   insulin  glargine-yfgn  7 Units Subcutaneous Daily   midodrine   10 mg Oral TID WC   multivitamin  1 tablet Oral QHS   mupirocin  ointment  1 Application Nasal BID   pantoprazole  (PROTONIX ) IV  40 mg Intravenous Q12H   sodium chloride  flush  3 mL Intravenous Q12H   thiamine   100 mg Oral Daily   acetaminophen , docusate sodium , ipratropium-albuterol , ondansetron  (ZOFRAN ) IV, mouth rinse, oxyCODONE , polyethylene glycol, sodium chloride  flush, traZODone   Assessment/ Plan:  Ms. Tammy Arias is a 68 y.o.  female presents to ED with weakness and has been admitted for Hemorrhagic shock (HCC) [R57.8] Shock (HCC) [R57.9] Acute GI bleeding [K92.2] Acute renal failure, unspecified acute renal failure type [N17.9]   Acute Kidney Injury with hyperkalemia, with baseline creatinine 0.92 on 08/22/24.  Acute kidney injury appears multifactorial at this time from hypotension and dehydration. Potassium 6.1 on admission. Corrected some with shifting measures. CRRT initiated on 12/1.   Labs have corrected with CRRT and has been weaned to one pressor.  CRRT stopped on 12/4. Remains oliguric, . Will continue dialysis with treatment this afternoon. Will continue to monitor need for dialysis daily.   Lab Results  Component  Value Date   CREATININE 2.07 (H) 10/31/2024   CREATININE 1.57 (H) 10/30/2024   CREATININE 2.23 (H) 10/29/2024    Intake/Output Summary (Last 24 hours) at 10/31/2024 1302 Last data filed at 10/31/2024 1200 Gross per 24 hour  Intake 738.05 ml  Output 1325 ml  Net -586.95 ml    2. Acute metabolic acidosis, S bicarb 9 on ED arrival. S bicarb has corrected with CRRT  3. Anemia with suspected acute blood loss Lab Results  Component Value Date   HGB 7.5 (L) 10/31/2024  Has received blood transfusions during this admission. GI has no plans to repeat EGD due to current and recent workups. Will order retacrit 10000 units IV with dialysis  4. Hypotension, hemorrhagic shock. Blood pressure managed with Levo   LOS: 4 Jag Lenz 12/4/20251:02 PM

## 2024-10-31 NOTE — Progress Notes (Signed)
   10/31/24 1623  Vitals  Temp 98.5 F (36.9 C)  Temp Source Oral  BP 97/60  MAP (mmHg) 72  BP Location Right Arm  BP Method Automatic  Patient Position (if appropriate) Lying  Pulse Rate 86  Pulse Rate Source Monitor  ECG Heart Rate 92  Resp 15  Oxygen Therapy  SpO2 100 %  O2 Device Room Air  Patient Activity (if Appropriate) In bed  Pulse Oximetry Type Continuous  Oximetry Probe Site Changed No  During Treatment Monitoring  Blood Flow Rate (mL/min) 249 mL/min  Arterial Pressure (mmHg) -67.07 mmHg  Venous Pressure (mmHg) 87.07 mmHg  TMP (mmHg) 18.18 mmHg  Ultrafiltration Rate (mL/min) 524 mL/min  Dialysate Flow Rate (mL/min) 299 ml/min  Duration of HD Treatment -hour(s) 2.46 hour(s)  Cumulative Fluid Removed (mL) per Treatment  480.26  HD Safety Checks Performed Yes  Post Treatment  Dialyzer Clearance Lightly streaked  Hemodialysis Intake (mL) 0 mL  Liters Processed 37.5  Fluid Removed (mL) 500 mL  Tolerated HD Treatment Yes  Hemodialysis Catheter Right Double lumen Permanent (Tunneled)  Placement Date/Time: 10/30/24 0615   Placed prior to admission: No  Time Out: Correct patient;Correct site;Correct procedure  Maximum sterile barrier precautions: Hand hygiene;Cap;Mask;Sterile gown;Sterile gloves;Large sterile sheet;Sterile probe cove...  Site Condition No complications  Blue Lumen Status Flushed;Heparin  locked;Dead end cap in place  Red Lumen Status Flushed;Heparin  locked;Dead end cap in place  Purple Lumen Status N/A  Catheter fill solution Heparin  1000 units/ml  Catheter fill volume (Arterial) 1.4 cc  Catheter fill volume (Venous) 1.4  Dressing Type Transparent  Dressing Status Antimicrobial disc/dressing in place;Clean, Dry, Intact  Interventions New dressing;Dressing changed;Antimicrobial disc changed  Drainage Description None  Dressing Change Due 11/07/24  Post treatment catheter status Capped and Clamped   Pt concerns. Pt tolerated tx well. Removed 500  ml.

## 2024-10-31 NOTE — Plan of Care (Signed)
  Problem: Education: Goal: Knowledge of General Education information will improve Description: Including pain rating scale, medication(s)/side effects and non-pharmacologic comfort measures Outcome: Progressing   Problem: Clinical Measurements: Goal: Ability to maintain clinical measurements within normal limits will improve Outcome: Progressing Goal: Will remain free from infection Outcome: Progressing Goal: Diagnostic test results will improve Outcome: Progressing Goal: Respiratory complications will improve Outcome: Progressing   Problem: Nutrition: Goal: Adequate nutrition will be maintained Outcome: Progressing   Problem: Pain Managment: Goal: General experience of comfort will improve and/or be controlled Outcome: Progressing   Problem: Safety: Goal: Ability to remain free from injury will improve Outcome: Progressing

## 2024-10-31 NOTE — Progress Notes (Signed)
 Daily Progress Note   Patient Name: Tammy Arias       Date: 10/31/2024 DOB: 1956-08-27  Age: 68 y.o. MRN#: [id number] Attending Physician: Parris Manna, MD Primary Care Physician: Valora Lynwood FALCON, MD Admit Date: 10/27/2024  Reason for Consultation/Follow-up: Establishing goals of care  Subjective: Notes and labs reviewed.  In to see patient.  She has several visitors at bedside including visitor from yesterday.  She is currently undergoing dialysis session.  She feels that she is tolerating this well at this time. She states she is still not sure if she would want long-term dialysis, but she states she is grateful that she was able to urinate more today, and she states she is grateful that the urine was a lighter shade of brown than it was yesterday.  Length of Stay: 4  Current Medications: Scheduled Meds:   alteplase  2 mg Intracatheter Once   Chlorhexidine  Gluconate Cloth  6 each Topical Daily   feeding supplement (GLUCERNA SHAKE)  237 mL Oral TID BM   insulin  aspart  0-5 Units Subcutaneous QHS   insulin  aspart  0-9 Units Subcutaneous TID WC   insulin  glargine-yfgn  7 Units Subcutaneous Daily   midodrine   10 mg Oral TID WC   multivitamin  1 tablet Oral QHS   mupirocin  ointment  1 Application Nasal BID   pantoprazole  (PROTONIX ) IV  40 mg Intravenous Q12H   sodium chloride  flush  3 mL Intravenous Q12H   thiamine   100 mg Oral Daily    Continuous Infusions:  norepinephrine  (LEVOPHED ) Adult infusion Stopped (10/31/24 1148)    PRN Meds: acetaminophen , docusate sodium , ipratropium-albuterol , ondansetron  (ZOFRAN ) IV, mouth rinse, oxyCODONE , polyethylene glycol, sodium chloride  flush, traZODone   Physical Exam Pulmonary:     Effort: Pulmonary effort is normal.  Skin:     General: Skin is warm and dry.  Neurological:     Mental Status: She is alert.             Vital Signs: BP (!) 97/53 (BP Location: Right Arm)   Pulse 89   Temp 98.7 F (37.1 C) (Oral)   Resp 16   Ht 5' 1 (1.549 m)   Wt 66.9 kg   SpO2 96%   BMI 27.87 kg/m  SpO2: SpO2: 96 % O2 Device: O2 Device: Room Air O2 Flow  Rate: O2 Flow Rate (L/min): 2 L/min  Intake/output summary:  Intake/Output Summary (Last 24 hours) at 10/31/2024 1452 Last data filed at 10/31/2024 1400 Gross per 24 hour  Intake 732.62 ml  Output 1400 ml  Net -667.38 ml   LBM: Last BM Date : 10/31/24 Baseline Weight: Weight: 53.4 kg Most recent weight: Weight: 66.9 kg   Patient Active Problem List   Diagnosis Date Noted   Shock (HCC) 10/27/2024   Acute GI bleeding 10/27/2024   Malnutrition of moderate degree 08/16/2024   HFrEF (heart failure with reduced ejection fraction) (HCC) 08/15/2024   Severe mitral regurgitation 08/15/2024   Hemorrhagic shock (HCC) 08/14/2024   Bilateral inguinal hernia without obstruction or gangrene 07/22/2024   Deficient knowledge of ileostomy 07/22/2024   GI bleed 07/19/2024   Esophageal varices without bleeding (HCC) 06/11/2024   Blood in stool 06/10/2024   GI bleeding 06/07/2024   Acute blood loss anemia 06/07/2024   Irritant contact dermatitis associated with fecal stoma 04/14/2024   Ileostomy care (HCC) 04/14/2024   Dehydration 04/14/2024   Hypokalemia 04/06/2024   Ileostomy in place [facility]) 04/04/2024   Status post total colectomy 04/04/2024   Chronic metabolic acidosis 04/04/2024   Enteritis due to Norovirus 04/04/2024   Elevated LFTs 04/04/2024   Glaucoma 04/04/2024   GERD without esophagitis 03/29/2024   Type 2 diabetes mellitus with chronic kidney disease, without long-term current use of insulin  (HCC) 03/29/2024   Dyslipidemia 03/29/2024   Chronic systolic CHF (congestive heart failure) (HCC) - LVEF 35 to 40% 03/17/2024   HTN (hypertension) 03/17/2024    Hypothyroidism 03/17/2024   Gout 03/17/2024   Hyponatremia 03/17/2024   Hypomagnesemia 03/17/2024   Acute renal failure 03/17/2024   Chronic diastolic CHF (congestive heart failure) (HCC) 06/07/2023   Chronic anticoagulation - on Eliquis  for chronic afib 06/07/2023   Cirrhosis and Portal hypertension  on CT(HCC) 06/07/2023   Thrombocytopenia 06/06/2023   CAD with hx of CABG 06/06/2023   OSA on CPAP 05/23/2023   S/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, right 04/05/2022   S/P TKR (total knee replacement) 04/05/2022   Colon cancer screening    Iron  deficiency anemia    Gastric AVM    Barrett's esophagus without dysplasia    Angiodysplasia of intestinal tract    Internal hemorrhoids    Pulmonary hypertension (HCC) 03/29/2017   Coronary artery disease involving native coronary artery of native heart with unstable angina pectoris (HCC) 03/21/2017   Ischemic cardiomyopathy 03/21/2017   Atrial fibrillation, chronic (HCC) 10/08/2014   Hyperlipidemia 10/08/2014   Type 2 diabetes mellitus (HCC) 10/08/2014    Palliative Care Assessment & Plan   Recommendations/Plan: DNR/DNI. Patient continues to be hopeful for renal recovery.  She is amenable to continuing temporary dialysis.  She states she is still unsure if she would want long-term dialysis. PMT will follow.  Patient needs time to see if she will have renal recovery.  If not, will need discussion on long-term dialysis.  Code Status:    Code Status Orders  (From admission, onward)           Start     Ordered   10/30/24 0934  Do not attempt resuscitation (DNR)- Limited -Do Not Intubate (DNI)  (Code Status)  Continuous       Question Answer Comment  If pulseless and not breathing No CPR or chest compressions.   In Pre-Arrest Conditions (Patient Is Breathing and Has A Pulse) Do not intubate. Provide all appropriate non-invasive medical interventions. Avoid ICU  transfer unless indicated or required.   Consent: Discussion  documented in EHR or advanced directives reviewed      10/30/24 0933           Code Status History     Date Active Date Inactive Code Status Order ID Comments User Context   10/27/2024 1242 10/30/2024 0933 Full Code [id number]  Isaiah Scrivener, MD ED   08/14/2024 1915 08/22/2024 1605 Full Code [id number]  Rust-Chester, Jenita CROME, NP ED   07/19/2024 1547 07/22/2024 1952 Full Code [id number]  Laurita Cort DASEN, MD ED   06/07/2024 2044 06/12/2024 1733 Full Code [id number]  Hilma Rankins, MD ED   04/04/2024 1907 04/07/2024 1729 Full Code [id number]  Moody Alto, MD ED   03/28/2024 2237 03/31/2024 1959 Full Code [id number]  Mansy, Madison LABOR, MD ED   03/17/2024 2111 03/19/2024 1734 Full Code [id number]  Hilma Rankins, MD ED   02/27/2024 2111 03/12/2024 1845 Full Code [id number]  Rust-Chester, Jenita CROME, NP ED   06/07/2023 0009 06/08/2023 2122 Full Code [id number]  Cleatus Delayne GAILS, MD ED   04/05/2022 1205 04/07/2022 1841 Full Code [id number]  Kathlynn Sharper, MD Inpatient   12/18/2018 0037 12/20/2018 1639 Partial Code [id number]  Mayo, Rockie Overly, MD Inpatient   03/29/2017 0911 03/29/2017 1350 Full Code [id number]  Bosie Vinie LABOR, MD Inpatient      Advance Directive Documentation    Flowsheet Row Most Recent Value  Type of Advance Directive Healthcare Power of Attorney  Pre-existing out of facility DNR order (yellow form or pink MOST form) --  MOST Form in Place? --    Camelia Lewis, NP  Please contact Palliative Medicine Team phone at [phone] for questions and concerns.

## 2024-10-31 NOTE — Progress Notes (Signed)
 NAME:  Tammy Arias, MRN:  [id number], DOB:  04-Sep-1956, LOS: 4 ADMISSION DATE:  10/27/2024    CHIEF COMPLAINT:  Circulatory shock  History of Present Illness:  68 y.o.female with medical problems of heart failure with reduced ejection fraction 25 to 30%, severe mitral regurgitation, moderate right ventricular systolic dysfunction, severe tricuspid regurgitation, atrial fibrillation, failed Watchman device, chronic anticoagulation with apixaban , GI bleed, coronary disease status post CABG in 2005, aortic stenosis repair, pulmonary hypertension, type 2 diabetes, hypertension, liver cirrhosis with grade 2 esophageal varices, C. 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    She reports to ED for generalized malaise and is being admitted for hypotension +GIB from ileostomy   She states she was in her normal state of health on Thanksgiving and was able to eat well. Started feeling bad on Friday. Was unable to eat on Saturday. States she has had a lot of bloody drainage from ileostomy.  had progressive weakness since yesterday.      Significant Hospital Events: Including procedures, antibiotic start and stop dates in addition to other pertinent events   11/30 admitted for hemorraghic shock, given 3-4 Liters of fluid, baseline SBP 90's 10/29/24- patient remains on vasopressors.  Platelet with significant drop overnight but appears to be chronic when trended over past 5 years.  10/30/24- patient remains critically ill, her renal function is improving, remains with electrolyte derrangements including low phos, low Na, low Ca, elevated glucose.  CBC with borderline low Hb, thrombocytopenia, eosinophilia. Blood cultures negative to date.  RRT is ongoing. S/p 1 unit prbc transfusion today.  10/31/24 - patient with severe electrolyte derrangements, hyponatremia and thrombocytopenia. Clinically improved but still critically ill on vasopressor  support   Antibiotics Given (last 72 hours)     None        Objective   Blood pressure 107/65, pulse 92, temperature 97.9 F (36.6 C), temperature source Oral, resp. rate 17, height 5' 1 (1.549 m), weight 66.6 kg, SpO2 100%.        Intake/Output Summary (Last 24 hours) at 10/31/2024 0855 Last data filed at 10/31/2024 0800 Gross per 24 hour  Intake 616.71 ml  Output 1625 ml  Net -1008.29 ml   Filed Weights   10/28/24 0441 10/29/24 0500 10/30/24 0708  Weight: 61.4 kg 63.4 kg 66.6 kg   GENERAL:NAD age appropriate EYES: Pupils equal, round, reactive to light.  No scleral icterus.  MOUTH: Moist mucosal membrane NECK: Supple.  PULMONARY: Lungs clear to auscultation, +rhonchi, +wheezing CARDIOVASCULAR: S1 and S2.  Regular rate and rhythm GASTROINTESTINAL: Soft, nontender, -distended. Positive bowel sounds. Ileostomy in place MUSCULOSKELETAL: No swelling, clubbing, or edema.  NEUROLOGIC: alert and awake SKIN:normal, warm to touch, Capillary refill delayed  Pulses present bilaterally   Labs/imaging that I havepersonally reviewed  (right click and Reselect all SmartList Selections daily)    ASSESSMENT AND PLAN SYNOPSIS 68 yo white female with multiple medical issues admitted for severe and acute hypovolumic shock GIB from Ileostomy  Hemorrhagic  SHOCK- present on admission SOURCE-GIB -use vasopressors to keep MAP>65 as needed -follow ABG and LA - cultures- negative thus far   2. ACUTE blood loss ANEMIA- -hx of total colectomy and ileostomy with bleeding from ostomy site at home -GI on case -appreciate input  -TRANSFUSE AS NEEDED IF HGB<7 DVT PRX with TED/SCD's ONLY    3. Chronic CARDIAC dysfunction   - advanced systolic CHF EF 30% and chronic diastolic CHF   -pulmonary  hypertension   - Valvular AF ICU monitoring   4. ACUTE KDIGO 4 on chronic KIDNEY disease  -likely due to ischemia post bleeding -continue Foley Catheter-assess need -Avoid nephrotoxic  agents -Follow urine output, BMP -Ensure adequate renal perfusion, optimize oxygenation -Renal dose medications  5. Liver cirrhosis with portal hypertensive disease             - esophageal varices             - recurrent GI bleeding             - coagulopathy            - poor prognosis   Intake/Output Summary (Last 24 hours) at 10/31/2024 0855 Last data filed at 10/31/2024 0800 Gross per 24 hour  Intake 616.71 ml  Output 1625 ml  Net -1008.29 ml       Latest Ref Rng & Units 10/31/2024    4:19 AM 10/30/2024    3:55 AM 10/29/2024    3:56 PM  BMP  Glucose 70 - 99 mg/dL 815  818  774   BUN 8 - 23 mg/dL 19  13  21    Creatinine 0.44 - 1.00 mg/dL 7.92  8.42  7.76   Sodium 135 - 145 mmol/L 126  133  131   Potassium 3.5 - 5.1 mmol/L 5.0  3.5  3.4   Chloride 98 - 111 mmol/L 93  98  97   CO2 22 - 32 mmol/L 25  26  25    Calcium  8.9 - 10.3 mg/dL 7.5  8.8  8.1     ENDO - ICU hypoglycemic\Hyperglycemia protocol -check FSBS per protocol   GI GI PROPHYLAXIS as indicated  NUTRITIONAL STATUS DIET-->NPO Constipation protocol as indicated Gi consulted  ELECTROLYTES -follow labs as needed -replace as needed -pharmacy consultation and following     Best practice (right click and Reselect all SmartList Selections daily)  Diet:  NPO DVT prophylaxis: Contraindicated GI prophylaxis: PPI Mobility:  bed rest  Code Status:  FULL CODE Disposition: ICU  Labs   CBC: Recent Labs  Lab 10/27/24 0908 10/28/24 0156 10/28/24 0431 10/28/24 0746 10/29/24 0619 10/29/24 1133 10/30/24 0355 10/30/24 1100 10/30/24 1628 10/30/24 2232 10/31/24 0419 10/31/24 0819  WBC 7.5  --  9.2  --  5.4  --  3.6*  --   --   --  4.9  --   NEUTROABS  --   --   --   --  4.5  --  2.3  --   --   --   --   --   HGB 6.2*   < > 7.0*   < > 8.4*   < > 7.1* 7.8* 7.7* 7.3* 7.8* 8.5*  HCT 19.7*   < > 21.0*   < > 24.1*   < > 20.9* 23.3* 23.1* 22.5* 23.0* 25.4*  MCV 115.9*  --  100.5*  --  91.6  --  95.0  --    --   --  91.3  --   PLT 167  --  154  --  66*  --  40*  --   --   --  41*  --    < > = values in this interval not displayed.    Basic Metabolic Panel: Recent Labs  Lab 10/27/24 1640 10/27/24 1640 10/28/24 0431 10/28/24 1602 10/29/24 0619 10/29/24 1556 10/30/24 0355 10/30/24 2232 10/31/24 0419  NA 127*  --  127* 128* 132* 131* 133*  --  126*  K 5.4*  --  5.2* 4.5 3.8 3.4* 3.5  --  5.0  CL 100  --  93* 90* 95* 97* 98  --  93*  CO2 10*  --  12* 13* 24 25 26   --  25  GLUCOSE 129*  --  130* 205* 161* 225* 181*  --  184*  BUN 66*  --  68* 64* 32* 21 13  --  19  CREATININE 7.07*  --  7.25* 6.25* 3.16* 2.23* 1.57*  --  2.07*  CALCIUM  7.7*  --  7.8* 7.3* 8.3* 8.1* 8.8*  --  7.5*  MG 1.7  --  1.5*  --   --   --  1.7  --  1.7  PHOS  --    < > 5.1* 4.8* 3.2 2.2* 1.6*  1.5* <1.0* 3.4   < > = values in this interval not displayed.   GFR: Estimated Creatinine Clearance: 22.4 mL/min (A) (by C-G formula based on SCr of 2.07 mg/dL (H)). Recent Labs  Lab 10/28/24 0431 10/28/24 1135 10/28/24 1325 10/28/24 1602 10/28/24 1930 10/29/24 0619 10/30/24 0355 10/31/24 0419  PROCALCITON  --   --   --  0.34  --   --   --   --   WBC 9.2  --   --   --   --  5.4 3.6* 4.9  LATICACIDVEN  --  3.8* 4.1* 4.6* 4.4*  --   --   --     Liver Function Tests: Recent Labs  Lab 10/27/24 0908 10/28/24 0431 10/28/24 0746 10/28/24 1602 10/29/24 0619 10/29/24 1556 10/30/24 0355 10/31/24 0419  AST 40  --  72*  --   --   --   --   --   ALT 26  --  47*  --   --   --   --   --   ALKPHOS 120  --  107  --   --   --   --   --   BILITOT 0.7  --  1.5*  --   --   --   --   --   PROT 6.4*  --  5.8*  --   --   --   --   --   ALBUMIN  4.0   < > 3.6 3.4* 3.3* 3.4* 3.6 3.3*   < > = values in this interval not displayed.   Recent Labs  Lab 10/28/24 0746  LIPASE 230*   No results for input(s): AMMONIA in the last 168 hours.  ABG    Component Value Date/Time   PHART 7.47 (H) 03/01/2024 1055   PCO2ART 40  03/01/2024 1055   PO2ART 112 (H) 03/01/2024 1055   HCO3 29.1 (H) 03/01/2024 1055   ACIDBASEDEF 2.7 (H) 02/29/2024 0952   O2SAT 67.3 08/22/2024 0500     Coagulation Profile: Recent Labs  Lab 10/27/24 0908 10/28/24 1039  INR 1.3* 1.4*    Cardiac Enzymes: No results for input(s): CKTOTAL, CKMB, CKMBINDEX, TROPONINI in the last 168 hours.  HbA1C: Hgb A1c MFr Bld  Date/Time Value Ref Range Status  10/28/2024 06:02 PM 5.5 4.8 - 5.6 % Final    Comment:    (NOTE)         Prediabetes: 5.7 - 6.4         Diabetes: >6.4         Glycemic control for adults with diabetes: <7.0   02/28/2024 06:08 AM 5.9 (H) 4.8 - 5.6 %  Final    Comment:    (NOTE) Pre diabetes:          5.7%-6.4%  Diabetes:              >6.4%  Glycemic control for   <7.0% adults with diabetes     CBG: Recent Labs  Lab 10/30/24 1127 10/30/24 1626 10/30/24 1944 10/30/24 2121 10/31/24 0721  GLUCAP 297* 173* 209* 157* 176*    Allergies Allergies  Allergen Reactions   Vioxx [Rofecoxib] Swelling       Critical care provider statement:   Total critical care time: 33 minutes   Performed by: Parris MD   Critical care time was exclusive of separately billable procedures and treating other patients.   Critical care was necessary to treat or prevent imminent or life-threatening deterioration.   Critical care was time spent personally by me on the following activities: development of treatment plan with patient and/or surrogate as well as nursing, discussions with consultants, evaluation of patient's response to treatment, examination of patient, obtaining history from patient or surrogate, ordering and performing treatments and interventions, ordering and review of laboratory studies, ordering and review of radiographic studies, pulse oximetry and re-evaluation of patient's condition.    Harlea Goetzinger, M.D.  Pulmonary & Critical Care Medicine

## 2024-10-31 NOTE — Consult Note (Signed)
 PHARMACY CONSULT NOTE  Pharmacy Consult for Electrolyte Monitoring and Replacement   Recent Labs: Potassium (mmol/L)  Date Value  10/31/2024 5.0   Magnesium  (mg/dL)  Date Value  87/95/7974 1.7   Calcium  (mg/dL)  Date Value  87/95/7974 7.5 (L)   Albumin  (g/dL)  Date Value  87/95/7974 3.3 (L)  05/18/2020 4.7   Phosphorus (mg/dL)  Date Value  87/95/7974 3.4   Sodium (mmol/L)  Date Value  10/31/2024 126 (L)   Assessment: 68 y/o female with h/o ischemic cardiomyopathy, gastric AVM, GI angiodysplasia, IBS-D, IDA, s/p watchman device, cholelithiasis, TIA, hypothyroidism, pulmonary hypertension, GERD, barrett's esophagus, HLD, HTN, OSA, CAD s/p CABG x 3, PAF, CHF, cirrhosis, portal hypertension, gout, CKD III, B12 deficiency, ventral hernia s/p mesh repair 2018, colonic angioectasias, diverticulosis, DDD, hiatal hernia, esophageal varices. Pharmacy is asked to follow and replace electrolytes while in CCU  CRRT stopped 10/30/24  Goal of Therapy:  Electrolytes WNL  Plan:  --Worsening hyponatremia. Defer management to PCCM team --Mg 1.7, magnesium  sulfate 1 g IV x 1 dose --Follow-up electrolytes with AM labs tomorrow  Marolyn KATHEE Mare 10/31/2024 7:42 AM

## 2024-10-31 NOTE — Progress Notes (Signed)
 Tammy Copping, MD [facility]   [address], [address] [place], [place] [address] Phone: [phone] Fax : [phone]   Subjective: The patient is doing well today without any complaints.  The patient started making urine again which she seems happy about.  The patient has not had any signs of further GI bleeding.  Her hemoglobin has gone up since yesterday.  She still running a low blood pressure and is on pressors.   Objective: Vital signs in last 24 hours: Vitals:   10/31/24 0615 10/31/24 0630 10/31/24 0645 10/31/24 0700  BP:  110/60  100/61  Pulse: (!) 56 67 81 80  Resp: 13 12 14 17   Temp:      TempSrc:      SpO2: 100% 100% 100% 100%  Weight:      Height:       Weight change:   Intake/Output Summary (Last 24 hours) at 10/31/2024 0746 Last data filed at 10/31/2024 0700 Gross per 24 hour  Intake 561.34 ml  Output 1625 ml  Net -1063.66 ml     Exam: Heart:: Regular rate and rhythm or without murmur or extra heart sounds Lungs: normal and clear to auscultation and percussion   Lab Results: @LABTEST2 @ Micro Results: Recent Results (from the past 240 hours)  Resp panel by RT-PCR (RSV, Flu A&B, Covid) Anterior Nasal Swab     Status: None   Collection Time: 10/27/24 11:01 AM   Specimen: Anterior Nasal Swab  Result Value Ref Range Status   SARS Coronavirus 2 by RT PCR NEGATIVE NEGATIVE Final    Comment: (NOTE) SARS-CoV-2 target nucleic acids are NOT DETECTED.  The SARS-CoV-2 RNA is generally detectable in upper respiratory specimens during the acute phase of infection. The lowest concentration of SARS-CoV-2 viral copies this assay can detect is 138 copies/mL. A negative result does not preclude SARS-Cov-2 infection and should not be used as the sole basis for treatment or other patient management decisions. A negative result may occur with  improper specimen collection/handling, submission of specimen other than nasopharyngeal swab, presence of viral mutation(s)  within the areas targeted by this assay, and inadequate number of viral copies(<138 copies/mL). A negative result must be combined with clinical observations, patient history, and epidemiological information. The expected result is Negative.  Fact Sheet for Patients:  bloggercourse.com  Fact Sheet for Healthcare Providers:  seriousbroker.it  This test is no t yet approved or cleared by the [place]  FDA and  has been authorized for detection and/or diagnosis of SARS-CoV-2 by FDA under an Emergency Use Authorization (EUA). This EUA will remain  in effect (meaning this test can be used) for the duration of the COVID-19 declaration under Section 564(b)(1) of the Act, 21 U.S.C.section 360bbb-3(b)(1), unless the authorization is terminated  or revoked sooner.       Influenza A by PCR NEGATIVE NEGATIVE Final   Influenza B by PCR NEGATIVE NEGATIVE Final    Comment: (NOTE) The Xpert Xpress SARS-CoV-2/FLU/RSV plus assay is intended as an aid in the diagnosis of influenza from Nasopharyngeal swab specimens and should not be used as a sole basis for treatment. Nasal washings and aspirates are unacceptable for Xpert Xpress SARS-CoV-2/FLU/RSV testing.  Fact Sheet for Patients: bloggercourse.com  Fact Sheet for Healthcare Providers: seriousbroker.it  This test is not yet approved or cleared by the [place]  FDA and has been authorized for detection and/or diagnosis of SARS-CoV-2 by FDA under an Emergency Use Authorization (EUA). This EUA will remain in effect (meaning this  test can be used) for the duration of the COVID-19 declaration under Section 564(b)(1) of the Act, 21 U.S.C. section 360bbb-3(b)(1), unless the authorization is terminated or revoked.     Resp Syncytial Virus by PCR NEGATIVE NEGATIVE Final    Comment: (NOTE) Fact Sheet for  Patients: bloggercourse.com  Fact Sheet for Healthcare Providers: seriousbroker.it  This test is not yet approved or cleared by the [place]  FDA and has been authorized for detection and/or diagnosis of SARS-CoV-2 by FDA under an Emergency Use Authorization (EUA). This EUA will remain in effect (meaning this test can be used) for the duration of the COVID-19 declaration under Section 564(b)(1) of the Act, 21 U.S.C. section 360bbb-3(b)(1), unless the authorization is terminated or revoked.  Performed at [facility], [address], [place], [place] [address]   Blood Culture (routine x 2)     Status: None (Preliminary result)   Collection Time: 10/27/24 12:18 PM   Specimen: Right Antecubital; Blood  Result Value Ref Range Status   Specimen Description RIGHT ANTECUBITAL  Final   Special Requests   Final    BOTTLES DRAWN AEROBIC AND ANAEROBIC Blood Culture adequate volume   Culture   Final    NO GROWTH 3 DAYS Performed at [facility], [address] [place], [place] [address]    Report Status PENDING  Incomplete  Blood Culture (routine x 2)     Status: None (Preliminary result)   Collection Time: 10/27/24 12:18 PM   Specimen: Left Antecubital; Blood  Result Value Ref Range Status   Specimen Description LEFT ANTECUBITAL  Final   Special Requests   Final    BOTTLES DRAWN AEROBIC AND ANAEROBIC Blood Culture results may not be optimal due to an inadequate volume of blood received in culture bottles   Culture   Final    NO GROWTH 3 DAYS Performed at [facility], [address] [place], [place] [address]    Report Status PENDING  Incomplete  MRSA Next Gen by PCR, Nasal     Status: Abnormal   Collection Time: 10/27/24  1:38 PM   Specimen: Nasal Mucosa; Nasal Swab  Result Value Ref Range Status   MRSA by PCR Next Gen DETECTED (A) NOT DETECTED Final    Comment: RESULT CALLED TO, READ BACK BY AND  VERIFIED WITH: KATHERINE CLAYTON @1520  10/27/24 MJU (NOTE) The GeneXpert MRSA Assay (FDA approved for NASAL specimens only), is one component of a comprehensive MRSA colonization surveillance program. It is not intended to diagnose MRSA infection nor to guide or monitor treatment for MRSA infections. Test performance is not FDA approved in patients less than 40 years old. Performed at [facility], [address], [place], [place] [address]    Studies/Results: No results found. Medications: I have reviewed the patient's current medications. Scheduled Meds:  alteplase  2 mg Intracatheter Once   Chlorhexidine  Gluconate Cloth  6 each Topical Daily   feeding supplement (GLUCERNA SHAKE)  237 mL Oral TID BM   insulin  aspart  0-5 Units Subcutaneous QHS   insulin  aspart  0-9 Units Subcutaneous TID WC   insulin  glargine-yfgn  7 Units Subcutaneous Daily   midodrine   10 mg Oral TID WC   multivitamin  1 tablet Oral QHS   mupirocin  ointment  1 Application Nasal BID   pantoprazole  (PROTONIX ) IV  40 mg Intravenous Q12H   sodium chloride  flush  3 mL Intravenous Q12H   thiamine   100 mg Oral Daily   Continuous Infusions:  magnesium  sulfate bolus IVPB     norepinephrine  (LEVOPHED ) Adult infusion 6 mcg/min (10/31/24 0700)   potassium PHOSPHATE  IVPB (in mmol) 64.4 mL/hr at 10/31/24 0700   PRN Meds:.acetaminophen , docusate sodium , ondansetron  (ZOFRAN ) IV, mouth rinse, oxyCODONE , polyethylene glycol, sodium chloride  flush, traZODone    Assessment: Principal Problem:   Hemorrhagic shock (HCC) Active Problems:   Acute renal failure   Shock (HCC)   Acute GI bleeding    Plan: The patient has a history of chronic anemia with an extensive workup done.  The patient's hemoglobin is stable at this time and her main concern is her anemia and renal issues.  The patient has had a increase in her hemoglobin from 7.3 to 7.8.  Not planning any repeat endoscopies at this time due to her extensive  workup in the past unless she should show signs of active bleeding.  The patient has been explained the plan and agrees with it. Will sign off at this time and please do not hesitate to call if she should have a drop in hemoglobin or signs of bleeding.   LOS: 4 days   Tammy Copping, MD.FACG 10/31/2024, 7:46 AM Pager [phone] 7am-5pm  Check AMION for 5pm -7am coverage and on weekends

## 2024-10-31 NOTE — Plan of Care (Signed)
  Problem: Education: Goal: Knowledge of General Education information will improve Description: Including pain rating scale, medication(s)/side effects and non-pharmacologic comfort measures Outcome: Progressing   Problem: Health Behavior/Discharge Planning: Goal: Ability to manage health-related needs will improve Outcome: Progressing   Problem: Clinical Measurements: Goal: Ability to maintain clinical measurements within normal limits will improve Outcome: Progressing Goal: Respiratory complications will improve Outcome: Progressing Goal: Cardiovascular complication will be avoided Outcome: Progressing   Problem: Nutrition: Goal: Adequate nutrition will be maintained Outcome: Progressing   Problem: Elimination: Goal: Will not experience complications related to bowel motility Outcome: Progressing Goal: Will not experience complications related to urinary retention Outcome: Progressing

## 2024-11-01 LAB — CBC
HCT: 22.5 % — ABNORMAL LOW (ref 36.0–46.0)
Hemoglobin: 7.4 g/dL — ABNORMAL LOW (ref 12.0–15.0)
MCH: 30.8 pg (ref 26.0–34.0)
MCHC: 32.9 g/dL (ref 30.0–36.0)
MCV: 93.8 fL (ref 80.0–100.0)
Platelets: 39 K/uL — ABNORMAL LOW (ref 150–400)
RBC: 2.4 MIL/uL — ABNORMAL LOW (ref 3.87–5.11)
RDW: 22.9 % — ABNORMAL HIGH (ref 11.5–15.5)
WBC: 4.7 K/uL (ref 4.0–10.5)
nRBC: 1.3 % — ABNORMAL HIGH (ref 0.0–0.2)

## 2024-11-01 LAB — HEMOGLOBIN AND HEMATOCRIT, BLOOD
HCT: 22 % — ABNORMAL LOW (ref 36.0–46.0)
HCT: 23 % — ABNORMAL LOW (ref 36.0–46.0)
HCT: 23.3 % — ABNORMAL LOW (ref 36.0–46.0)
Hemoglobin: 7.2 g/dL — ABNORMAL LOW (ref 12.0–15.0)
Hemoglobin: 7.6 g/dL — ABNORMAL LOW (ref 12.0–15.0)
Hemoglobin: 7.7 g/dL — ABNORMAL LOW (ref 12.0–15.0)

## 2024-11-01 LAB — RENAL FUNCTION PANEL
Albumin: 3.3 g/dL — ABNORMAL LOW (ref 3.5–5.0)
Anion gap: 4 — ABNORMAL LOW (ref 5–15)
BUN: 16 mg/dL (ref 8–23)
CO2: 27 mmol/L (ref 22–32)
Calcium: 7.2 mg/dL — ABNORMAL LOW (ref 8.9–10.3)
Chloride: 95 mmol/L — ABNORMAL LOW (ref 98–111)
Creatinine, Ser: 1.56 mg/dL — ABNORMAL HIGH (ref 0.44–1.00)
GFR, Estimated: 36 mL/min — ABNORMAL LOW (ref >60)
Glucose, Bld: 134 mg/dL — ABNORMAL HIGH (ref 70–99)
Phosphorus: 1.7 mg/dL — ABNORMAL LOW (ref 2.5–4.6)
Potassium: 5 mmol/L (ref 3.5–5.1)
Sodium: 126 mmol/L — ABNORMAL LOW (ref 135–145)

## 2024-11-01 LAB — CULTURE, BLOOD (ROUTINE X 2)
Culture: NO GROWTH
Culture: NO GROWTH
Special Requests: ADEQUATE

## 2024-11-01 LAB — GLUCOSE, CAPILLARY
Glucose-Capillary: 131 mg/dL — ABNORMAL HIGH (ref 70–99)
Glucose-Capillary: 149 mg/dL — ABNORMAL HIGH (ref 70–99)
Glucose-Capillary: 210 mg/dL — ABNORMAL HIGH (ref 70–99)
Glucose-Capillary: 206 mg/dL — ABNORMAL HIGH (ref 70–99)

## 2024-11-01 LAB — PRO BRAIN NATRIURETIC PEPTIDE: Pro Brain Natriuretic Peptide: 7541 pg/mL — ABNORMAL HIGH (ref <300.0)

## 2024-11-01 LAB — MAGNESIUM: Magnesium: 1.7 mg/dL (ref 1.7–2.4)

## 2024-11-01 LAB — VITAMIN C: Vitamin C: 1.8 mg/dL (ref 0.4–2.0)

## 2024-11-01 MED ORDER — MAGNESIUM SULFATE 2 GM/50ML IV SOLN
2.0000 g | Freq: Once | INTRAVENOUS | Status: AC
  Administered 2024-11-01: 2 g via INTRAVENOUS
  Filled 2024-11-01: qty 50

## 2024-11-01 MED ORDER — LEVOTHYROXINE SODIUM 50 MCG PO TABS
50.0000 ug | ORAL_TABLET | Freq: Every day | ORAL | Status: DC
  Administered 2024-11-02 – 2024-11-18 (×17): 50 ug via ORAL
  Filled 2024-11-01 (×17): qty 1

## 2024-11-01 MED ORDER — FUROSEMIDE 10 MG/ML IJ SOLN
40.0000 mg | Freq: Once | INTRAMUSCULAR | Status: AC
  Administered 2024-11-01: 40 mg via INTRAVENOUS
  Filled 2024-11-01: qty 4

## 2024-11-01 MED ORDER — K PHOS MONO-SOD PHOS DI & MONO 155-852-130 MG PO TABS
500.0000 mg | ORAL_TABLET | Freq: Two times a day (BID) | ORAL | Status: AC
  Administered 2024-11-01 (×2): 500 mg via ORAL
  Filled 2024-11-01 (×2): qty 2

## 2024-11-01 MED ORDER — CHLORHEXIDINE GLUCONATE CLOTH 2 % EX PADS
6.0000 | MEDICATED_PAD | Freq: Every evening | CUTANEOUS | Status: DC
  Administered 2024-11-02 – 2024-11-16 (×9): 6 via TOPICAL

## 2024-11-01 MED ORDER — LATANOPROST 0.005 % OP SOLN
1.0000 [drp] | Freq: Every day | OPHTHALMIC | Status: DC
  Administered 2024-11-01 – 2024-11-17 (×16): 1 [drp] via OPHTHALMIC
  Filled 2024-11-01 (×2): qty 2.5

## 2024-11-01 MED ORDER — SODIUM CHLORIDE 0.9 % IV SOLN: INTRAVENOUS | Status: DC

## 2024-11-01 MED ORDER — SODIUM CHLORIDE 1 G PO TABS
2.0000 g | ORAL_TABLET | Freq: Three times a day (TID) | ORAL | Status: AC
  Administered 2024-11-01 (×2): 2 g via ORAL
  Filled 2024-11-01 (×3): qty 2

## 2024-11-01 NOTE — Progress Notes (Signed)
 Occupational Therapy Treatment Patient Details Name: Tammy Arias MRN: [id number] DOB: 01-29-1956 Today's Date: 11/01/2024   History of present illness 68 y.o.female with medical problems of heart failure with reduced ejection fraction 25 to 30%, severe mitral regurgitation, moderate right ventricular systolic dysfunction, severe tricuspid regurgitation, atrial fibrillation, failed Watchman device, chronic anticoagulation with apixaban , GI bleed, coronary disease status post CABG in 2005, aortic stenosis repair, pulmonary hypertension, type 2 diabetes, hypertension, liver cirrhosis with grade 2 esophageal varices, C. 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         She reports to ED for generalized malaise and is being admitted for hypotension  +GIB from ileostomy   OT comments  Ms Pettijohn was seen for OT treatment on this date. Upon arrival to room pt seated in chair, agreeable to tx. Pt requires CGA + 4WW for ADL t/f ~100 ft. Educated on DME recs, d/c recs, and falls safety. Pt making good progress toward goals, will continue to follow POC. Discharge recommendation updated to reflect progress.       If plan is discharge home, recommend the following:  Help with stairs or ramp for entrance   Equipment Recommendations  None recommended by OT    Recommendations for Other Services      Precautions / Restrictions Precautions Precautions: Fall Recall of Precautions/Restrictions: Intact Restrictions Weight Bearing Restrictions Per Provider Order: No       Mobility Bed Mobility               General bed mobility comments: not tested    Transfers Overall transfer level: Needs assistance Equipment used: Rollator (4 wheels) Transfers: Sit to/from Stand Sit to Stand: Supervision                 Balance Overall balance assessment: Needs assistance Sitting-balance support: Feet supported Sitting balance-Leahy Scale:  Normal     Standing balance support: No upper extremity supported, During functional activity Standing balance-Leahy Scale: Fair                             ADL either performed or assessed with clinical judgement   ADL Overall ADL's : Needs assistance/impaired                                       General ADL Comments: CGA + 4WW for ADL t/f ~100 ft.     Communication Communication Factors Affecting Communication: Hearing impaired   Cognition Arousal: Alert Behavior During Therapy: WFL for tasks assessed/performed Cognition: No apparent impairments                               Following commands: Intact        Cueing   Cueing Techniques: Verbal cues        General Comments standing BP 91/46, after 3 min walking standign BP 100/64    Pertinent Vitals/ Pain       Pain Assessment Pain Assessment: No/denies pain   Frequency  Min 2X/week        Progress Toward Goals  OT Goals(current goals can now be found in the care plan section)  Progress towards OT goals: Progressing toward goals  Acute Rehab OT Goals OT Goal Formulation: With patient Time For Goal Achievement:  11/13/24 Potential to Achieve Goals: Fair ADL Goals Pt Will Perform Grooming: with supervision;standing Pt Will Perform Lower Body Dressing: with supervision;sit to/from stand Pt Will Transfer to Toilet: with supervision;ambulating Pt Will Perform Toileting - Clothing Manipulation and hygiene: with supervision;sit to/from stand  Plan      Co-evaluation                 AM-PAC OT 6 Clicks Daily Activity     Outcome Measure   Help from another person eating meals?: None Help from another person taking care of personal grooming?: A Little Help from another person toileting, which includes using toliet, bedpan, or urinal?: A Little Help from another person bathing (including washing, rinsing, drying)?: A Little Help from another person to put  on and taking off regular upper body clothing?: None Help from another person to put on and taking off regular lower body clothing?: A Little 6 Click Score: 20    End of Session Equipment Utilized During Treatment: Gait belt;Rollator (4 wheels)  OT Visit Diagnosis: Unsteadiness on feet (R26.81);Repeated falls (R29.6);Muscle weakness (generalized) (M62.81)   Activity Tolerance Patient tolerated treatment well   Patient Left in chair;with call bell/phone within reach   Nurse Communication Mobility status        Time: 8971-8953 OT Time Calculation (min): 18 min  Charges: OT General Charges $OT Visit: 1 Visit OT Treatments $Therapeutic Activity: 8-22 mins  Elston Slot, M.S. OTR/L  11/01/24, 12:42 PM  ascom [phone]

## 2024-11-01 NOTE — Progress Notes (Signed)
 Nutrition Follow-up  DOCUMENTATION CODES:   Non-severe (moderate) malnutrition in context of chronic illness  INTERVENTION:   -Liberalize diet to carb modified for wider variety of meal selections -Continue Glucerna Shake po TID, each supplement provides 220 kcal and 10 grams of protein  -Continue renal MVI daily -Continue 100 mg thiamine  daily x 7 days -Continue daily weights -Continue 2.5 mg vitamin K daily  NUTRITION DIAGNOSIS:   Moderate Malnutrition related to chronic illness as evidenced by moderate fat depletion, moderate muscle depletion.  Ongoing  GOAL:   Patient will meet greater than or equal to 90% of their needs  Progressing   MONITOR:   PO intake, Supplement acceptance, Diet advancement, Labs, Weight trends, I & O's, Skin  REASON FOR ASSESSMENT:   Consult Assessment of nutrition requirement/status  ASSESSMENT:   68 y/o female with h/o ischemic cardiomyopathy, gastric AVM, GI angiodysplasia, esophageal varices, IBS-D, IDA, s/p watchman device, cholelithiasis, TIA, hypothyroidism, pulmonary hypertension, GERD, barrett's esophagus, HLD, HTN, OSA, CAD s/p CABG x 3, PAF, CHF, cirrhosis, portal hypertension, gout, CKD III, B12 deficiency, ventral hernia s/p mesh repair 2018, colonic angioectasias, diverticulosis, DDD, hiatal hernia, C-diff, pancolitis s/p exploratory laparotomy, 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  12/3- CRRT d/c, transitioned to HD  Reviewed I/O's: -1.6 L x 24 hours and +4  L since admission  UOP: 350 ml x 24 hours   Ileostomy output: 650 ml x 24 hours  Case discussed with RN, MD, and ICU rounds. Patient received HD yesterday and nephrology to re-assess for need for HD today. RN reports patient's BP tends to drop overnight, causing pressor support. There have been no  signs of bleeding. Hgb has been stable. Patient has been eating well and is eager to get out of bed. Noted patient finished working with rehab team prior to visit. Plan to transfer to the floor soon.   Spoke with patient, who was sitting in recliner chair at time of visit. Patient reports feeling a little better and is pleased by her progress. She reports decreased appetite PTA secondary to bloating, but intake has improved today. She ate most of her breakfast. Documented meal completions 60-100%. Patient likes the Glucerna supplements and has been drinking them.   RD reviewed plan of care and discussed importance of good meal and supplement intake to promote healing. Patient is amenable to continuing Glucerna supplements.   Reviewed weights. Noted weight have fluctuated from 53.4-73.3 kg since admission. Suspect HD needs are contributing to weight changes.   Palliative care following for goals of care discussions. Patinet currently unsure if she would want long term HD.   Medications reviewed and include protonix , phosphorus, vitamin K, sodium chloride , thiamine , and 0.9% sodium chloride  infusion @ 100 ml/hr.   Labs reviewed: Vitamin C  WDL, Na: 126, Phos: 1.7 (repleted by pharmacy), CBGS: 131-212 (inpatient orders for glycemic control are 0-5 units insulin  aspart daily at bedtime, 0-9 units insulin  aspart TID with meals, and 7 units insulin  glargine-yfgn daily). DM coordinator following and insulin  recommendations have been implemented.   Diet Order:   Diet Order             Diet Carb Modified Fluid consistency: Thin  Diet effective now                   EDUCATION NEEDS:   Education needs  have been addressed  Skin:  Skin Assessment: Reviewed RN Assessment  Last BM:  11/01/24 (25 ml via ileostomy)  Height:   Ht Readings from Last 1 Encounters:  10/27/24 5' 1 (1.549 m)    Weight:   Wt Readings from Last 1 Encounters:  11/01/24 73.3 kg    Ideal Body Weight:  47.7  kg  BMI:  Body mass index is 30.53 kg/m.  Estimated Nutritional Needs:   Kcal:  1600-1800kcal/day  Protein:  80-90g/day  Fluid:  1.4-1.6L/day    Margery ORN, RD, LDN, CDCES Registered Dietitian III Certified Diabetes Care and Education Specialist If unable to reach this RD, please use RD Inpatient group chat on secure chat between hours of 8am-4 pm daily

## 2024-11-01 NOTE — Progress Notes (Signed)
 NAME:  Tammy Arias, MRN:  [id number], DOB:  [birth year], LOS: 5 ADMISSION DATE:  10/27/2024    CHIEF COMPLAINT:  Circulatory shock  History of Present Illness:  68 y.o.female with medical problems of heart failure with reduced ejection fraction 25 to 30%, severe mitral regurgitation, moderate right ventricular systolic dysfunction, severe tricuspid regurgitation, atrial fibrillation, failed Watchman device, chronic anticoagulation with apixaban , GI bleed, coronary disease status post CABG in 2005, aortic stenosis repair, pulmonary hypertension, type 2 diabetes, hypertension, liver cirrhosis with grade 2 esophageal varices, C. 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    She reports to ED for generalized malaise and is being admitted for hypotension +GIB from ileostomy   She states she was in her normal state of health on Thanksgiving and was able to eat well. Started feeling bad on Friday. Was unable to eat on Saturday. States she has had a lot of bloody drainage from ileostomy.  had progressive weakness since yesterday.      Significant Hospital Events: Including procedures, antibiotic start and stop dates in addition to other pertinent events   11/30 admitted for hemorraghic shock, given 3-4 Liters of fluid, baseline SBP 90's 10/29/24- patient remains on vasopressors.  Platelet with significant drop overnight but appears to be chronic when trended over past 5 years.  10/30/24- patient remains critically ill, her renal function is improving, remains with electrolyte derrangements including low phos, low Na, low Ca, elevated glucose.  CBC with borderline low Hb, thrombocytopenia, eosinophilia. Blood cultures negative to date.  RRT is ongoing. S/p 1 unit prbc transfusion today.  10/31/24 - patient with severe electrolyte derrangements, hyponatremia and thrombocytopenia. Clinically improved but still critically ill on vasopressor  support 11/01/24- patient restarted on levophed  overnight. No bleeding per ostomy. RIJ TLC +.   Plan to transition to TRH service.   Antibiotics Given (last 72 hours)     None        Objective   Blood pressure 92/61, pulse 79, temperature 99 F (37.2 C), temperature source Oral, resp. rate 17, height 5' 1 (1.549 m), weight 73.3 kg, SpO2 98%.        Intake/Output Summary (Last 24 hours) at 11/01/2024 1012 Last data filed at 11/01/2024 0929 Gross per 24 hour  Intake 319.27 ml  Output 2300 ml  Net -1980.73 ml   Filed Weights   10/31/24 1342 10/31/24 1638 11/01/24 0500  Weight: 66.9 kg 66.4 kg 73.3 kg   GENERAL:NAD age appropriate EYES: Pupils equal, round, reactive to light.  No scleral icterus.  MOUTH: Moist mucosal membrane NECK: Supple.  PULMONARY: Lungs clear to auscultation, +rhonchi, +wheezing CARDIOVASCULAR: S1 and S2.  Regular rate and rhythm GASTROINTESTINAL: Soft, nontender, -distended. Positive bowel sounds. Ileostomy in place MUSCULOSKELETAL: No swelling, clubbing, or edema.  NEUROLOGIC: alert and awake SKIN:normal, warm to touch, Capillary refill delayed  Pulses present bilaterally   Labs/imaging that I havepersonally reviewed  (right click and Reselect all SmartList Selections daily)    ASSESSMENT AND PLAN SYNOPSIS 68 yo white female with multiple medical issues admitted for severe and acute hypovolumic shock GIB from Ileostomy  Hemorrhagic  SHOCK- present on admission SOURCE-GIB -use vasopressors to keep MAP>65 as needed -follow ABG and LA - cultures- negative thus far   2. ACUTE blood loss ANEMIA- -hx of total colectomy and ileostomy with bleeding from ostomy site at home -GI on case -appreciate input  -TRANSFUSE AS NEEDED IF HGB<7 DVT PRX with TED/SCD's ONLY  3. Chronic CARDIAC dysfunction   - advanced systolic CHF EF 30% and chronic diastolic CHF   -pulmonary hypertension   - Valvular AF ICU monitoring   4. ACUTE KDIGO 4 on  chronic KIDNEY disease  -likely due to ischemia post bleeding -continue Foley Catheter-assess need -Avoid nephrotoxic agents -Follow urine output, BMP -Ensure adequate renal perfusion, optimize oxygenation -Renal dose medications  5. Liver cirrhosis with portal hypertensive disease             - esophageal varices             - recurrent GI bleeding             - coagulopathy            - poor prognosis   Intake/Output Summary (Last 24 hours) at 11/01/2024 1012 Last data filed at 11/01/2024 0929 Gross per 24 hour  Intake 319.27 ml  Output 2300 ml  Net -1980.73 ml       Latest Ref Rng & Units 11/01/2024    5:34 AM 10/31/2024    4:19 AM 10/30/2024    3:55 AM  BMP  Glucose 70 - 99 mg/dL 865  815  818   BUN 8 - 23 mg/dL 16  19  13    Creatinine 0.44 - 1.00 mg/dL 8.43  7.92  8.42   Sodium 135 - 145 mmol/L 126  126  133   Potassium 3.5 - 5.1 mmol/L 5.0  5.0  3.5   Chloride 98 - 111 mmol/L 95  93  98   CO2 22 - 32 mmol/L 27  25  26    Calcium  8.9 - 10.3 mg/dL 7.2  7.5  8.8     ENDO - ICU hypoglycemic\Hyperglycemia protocol -check FSBS per protocol   GI GI PROPHYLAXIS as indicated  NUTRITIONAL STATUS DIET-->NPO Constipation protocol as indicated Gi consulted  ELECTROLYTES -follow labs as needed -replace as needed -pharmacy consultation and following     Best practice (right click and Reselect all SmartList Selections daily)  Diet:  NPO DVT prophylaxis: Contraindicated GI prophylaxis: PPI Mobility:  bed rest  Code Status:  FULL CODE Disposition: ICU  Labs   CBC: Recent Labs  Lab 10/28/24 0431 10/28/24 0746 10/29/24 0619 10/29/24 1133 10/30/24 0355 10/30/24 1100 10/31/24 0419 10/31/24 0819 10/31/24 1138 10/31/24 1707 10/31/24 2355 11/01/24 0534  WBC 9.2  --  5.4  --  3.6*  --  4.9  --   --   --   --  4.7  NEUTROABS  --   --  4.5  --  2.3  --   --   --   --   --   --   --   HGB 7.0*   < > 8.4*   < > 7.1*   < > 7.8* 8.5* 7.5* 7.7* 7.2* 7.4*  HCT  21.0*   < > 24.1*   < > 20.9*   < > 23.0* 25.4* 24.4* 23.3* 22.0* 22.5*  MCV 100.5*  --  91.6  --  95.0  --  91.3  --   --   --   --  93.8  PLT 154  --  66*  --  40*  --  41*  --   --   --   --  39*   < > = values in this interval not displayed.    Basic Metabolic Panel: Recent Labs  Lab 10/27/24 1640 10/28/24 0431 10/28/24 1602 10/29/24 9380 10/29/24 1556 10/30/24 0355  10/30/24 2232 10/31/24 0419 11/01/24 0534  NA 127* 127*   < > 132* 131* 133*  --  126* 126*  K 5.4* 5.2*   < > 3.8 3.4* 3.5  --  5.0 5.0  CL 100 93*   < > 95* 97* 98  --  93* 95*  CO2 10* 12*   < > 24 25 26   --  25 27  GLUCOSE 129* 130*   < > 161* 225* 181*  --  184* 134*  BUN 66* 68*   < > 32* 21 13  --  19 16  CREATININE 7.07* 7.25*   < > 3.16* 2.23* 1.57*  --  2.07* 1.56*  CALCIUM  7.7* 7.8*   < > 8.3* 8.1* 8.8*  --  7.5* 7.2*  MG 1.7 1.5*  --   --   --  1.7  --  1.7 1.7  PHOS  --  5.1*   < > 3.2 2.2* 1.6*  1.5* <1.0* 3.4 1.7*   < > = values in this interval not displayed.   GFR: Estimated Creatinine Clearance: 31.2 mL/min (A) (by C-G formula based on SCr of 1.56 mg/dL (H)). Recent Labs  Lab 10/28/24 1135 10/28/24 1325 10/28/24 1602 10/28/24 1930 10/29/24 0619 10/30/24 0355 10/31/24 0419 11/01/24 0534  PROCALCITON  --   --  0.34  --   --   --   --   --   WBC  --   --   --   --  5.4 3.6* 4.9 4.7  LATICACIDVEN 3.8* 4.1* 4.6* 4.4*  --   --   --   --     Liver Function Tests: Recent Labs  Lab 10/27/24 0908 10/28/24 0431 10/28/24 0746 10/28/24 1602 10/29/24 0619 10/29/24 1556 10/30/24 0355 10/31/24 0419 11/01/24 0534  AST 40  --  72*  --   --   --   --   --   --   ALT 26  --  47*  --   --   --   --   --   --   ALKPHOS 120  --  107  --   --   --   --   --   --   BILITOT 0.7  --  1.5*  --   --   --   --   --   --   PROT 6.4*  --  5.8*  --   --   --   --   --   --   ALBUMIN  4.0   < > 3.6   < > 3.3* 3.4* 3.6 3.3* 3.3*   < > = values in this interval not displayed.   Recent Labs  Lab  10/28/24 0746  LIPASE 230*   No results for input(s): AMMONIA in the last 168 hours.  ABG    Component Value Date/Time   PHART 7.47 (H) 03/01/2024 1055   PCO2ART 40 03/01/2024 1055   PO2ART 112 (H) 03/01/2024 1055   HCO3 29.1 (H) 03/01/2024 1055   ACIDBASEDEF 2.7 (H) 02/29/2024 0952   O2SAT 67.3 08/22/2024 0500     Coagulation Profile: Recent Labs  Lab 10/27/24 0908 10/28/24 1039  INR 1.3* 1.4*    Cardiac Enzymes: No results for input(s): CKTOTAL, CKMB, CKMBINDEX, TROPONINI in the last 168 hours.  HbA1C: Hgb A1c MFr Bld  Date/Time Value Ref Range Status  10/28/2024 06:02 PM 5.5 4.8 - 5.6 % Final    Comment:    (NOTE)  Prediabetes: 5.7 - 6.4         Diabetes: >6.4         Glycemic control for adults with diabetes: <7.0   02/28/2024 06:08 AM 5.9 (H) 4.8 - 5.6 % Final    Comment:    (NOTE) Pre diabetes:          5.7%-6.4%  Diabetes:              >6.4%  Glycemic control for   <7.0% adults with diabetes     CBG: Recent Labs  Lab 10/31/24 0721 10/31/24 1119 10/31/24 1651 10/31/24 2113 11/01/24 0727  GLUCAP 176* 207* 176* 212* 131*    Allergies Allergies  Allergen Reactions   Vioxx [Rofecoxib] Swelling       Critical care provider statement:   Total critical care time: 33 minutes   Performed by: Parris MD   Critical care time was exclusive of separately billable procedures and treating other patients.   Critical care was necessary to treat or prevent imminent or life-threatening deterioration.   Critical care was time spent personally by me on the following activities: development of treatment plan with patient and/or surrogate as well as nursing, discussions with consultants, evaluation of patient's response to treatment, examination of patient, obtaining history from patient or surrogate, ordering and performing treatments and interventions, ordering and review of laboratory studies, ordering and review of radiographic  studies, pulse oximetry and re-evaluation of patient's condition.    Cardale Dorer, M.D.  Pulmonary & Critical Care Medicine

## 2024-11-01 NOTE — Progress Notes (Signed)
 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 She reports to ED for generalized malaise and is being admitted for Hemorrhagic shock (HCC) [R57.8] Shock (HCC) [R57.9] Acute GI bleeding [K92.2] Acute renal failure, unspecified acute renal failure type [N17.9]  Patient is known to our practice from previous admissions and has seen Dr Dennise in he office once.   Update: Patient seen sitting up in chair Alert, oriented, pleasant Remains on room air  Urine output , roughly spilled  Objective:  Vital signs in last 24 hours:  Temp:  [98.5 F (36.9 C)-99.1 F (37.3 C)] 99 F (37.2 C) (12/05 0400) Pulse Rate:  [75-115] 79 (12/05 1400) Resp:  [12-30] 18 (12/05 1400) BP: (77-109)/(43-77) 96/53 (12/05 1400) SpO2:  [95 %-100 %] 100 % (12/05 1400) Weight:  [66.4 kg-73.3 kg] 73.3 kg (12/05 0500)  Weight change: 0.3 kg Filed Weights   10/31/24 1342 10/31/24 1638 11/01/24 0500  Weight: 66.9 kg 66.4 kg 73.3 kg    Intake/Output: I/O last 3 completed shifts: In: 865.6 [P.O.:120; I.V.:130.7; IV Piggyback:614.8] Out: 3050 [Urine:500; Other:1000; Stool:1550]   Intake/Output this shift:  Total I/O In: -  Out: 515 [Urine:450; Stool:65]  Physical Exam: General: NAD  Head: Normocephalic, atraumatic. Moist oral mucosa  Eyes: Anicteric  Lungs:  Clear to auscultation, normal effort  Heart: Regular rate  and rhythm  Abdomen:  Soft, nontender. Ileostomy   Extremities:  No peripheral edema.  Neurologic: Awake, alert, conversant  Skin: Warm,dry, no rash  Access: Rt groin Trialysis     Basic Metabolic Panel: Recent Labs  Lab 10/27/24 1640 10/28/24 0431 10/28/24 1602 10/29/24 0619 10/29/24 1556 10/30/24 0355 10/30/24 2232 10/31/24 0419 11/01/24 0534  NA 127* 127*   < > 132* 131* 133*  --  126* 126*  K 5.4* 5.2*   < > 3.8 3.4* 3.5  --  5.0 5.0  CL 100 93*   < > 95* 97* 98  --  93* 95*  CO2 10* 12*   < > 24 25 26   --  25 27  GLUCOSE 129* 130*   < > 161* 225* 181*  --  184* 134*  BUN 66* 68*   < > 32* 21 13  --  19 16  CREATININE 7.07* 7.25*   < > 3.16* 2.23* 1.57*  --  2.07* 1.56*  CALCIUM  7.7* 7.8*   < > 8.3* 8.1* 8.8*  --  7.5* 7.2*  MG 1.7 1.5*  --   --   --  1.7  --  1.7 1.7  PHOS  --  5.1*   < > 3.2 2.2* 1.6*  1.5* <1.0* 3.4 1.7*   < > = values in this interval not displayed.    Liver Function Tests: Recent Labs  Lab 10/27/24 0908 10/28/24 0431 10/28/24 0746 10/28/24 1602 10/29/24 0619 10/29/24 1556 10/30/24 0355 10/31/24 0419 11/01/24 0534  AST 40  --  72*  --   --   --   --   --   --  ALT 26  --  47*  --   --   --   --   --   --   ALKPHOS 120  --  107  --   --   --   --   --   --   BILITOT 0.7  --  1.5*  --   --   --   --   --   --   PROT 6.4*  --  5.8*  --   --   --   --   --   --   ALBUMIN  4.0   < > 3.6   < > 3.3* 3.4* 3.6 3.3* 3.3*   < > = values in this interval not displayed.   Recent Labs  Lab 10/28/24 0746  LIPASE 230*   No results for input(s): AMMONIA in the last 168 hours.  CBC: Recent Labs  Lab 10/28/24 0431 10/28/24 0746 10/29/24 0619 10/29/24 1133 10/30/24 0355 10/30/24 1100 10/31/24 0419 10/31/24 0819 10/31/24 1138 10/31/24 1707 10/31/24 2355 11/01/24 0534 11/01/24 1118  WBC 9.2  --  5.4  --  3.6*  --  4.9  --   --   --   --  4.7  --   NEUTROABS  --   --  4.5  --  2.3  --   --   --   --   --   --   --   --   HGB 7.0*   <  > 8.4*   < > 7.1*   < > 7.8*   < > 7.5* 7.7* 7.2* 7.4* 7.7*  HCT 21.0*   < > 24.1*   < > 20.9*   < > 23.0*   < > 24.4* 23.3* 22.0* 22.5* 23.0*  MCV 100.5*  --  91.6  --  95.0  --  91.3  --   --   --   --  93.8  --   PLT 154  --  66*  --  40*  --  41*  --   --   --   --  39*  --    < > = values in this interval not displayed.    Cardiac Enzymes: No results for input(s): CKTOTAL, CKMB, CKMBINDEX, TROPONINI in the last 168 hours.  BNP: Invalid input(s): POCBNP  CBG: Recent Labs  Lab 10/31/24 1119 10/31/24 1651 10/31/24 2113 11/01/24 0727 11/01/24 1118  GLUCAP 207* 176* 212* 131* 149*    Microbiology: Results for orders placed or performed during the hospital encounter of 10/27/24  Resp panel by RT-PCR (RSV, Flu A&B, Covid) Anterior Nasal Swab     Status: None   Collection Time: 10/27/24 11:01 AM   Specimen: Anterior Nasal Swab  Result Value Ref Range Status   SARS Coronavirus 2 by RT PCR NEGATIVE NEGATIVE Final    Comment: (NOTE) SARS-CoV-2 target nucleic acids are NOT DETECTED.  The SARS-CoV-2 RNA is generally detectable in upper respiratory specimens during the acute phase of infection. The lowest concentration of SARS-CoV-2 viral copies this assay can detect is 138 copies/mL. A negative result does not preclude SARS-Cov-2 infection and should not be used as the sole basis for treatment or other patient management decisions. A negative result may occur with  improper specimen collection/handling, submission of specimen other than nasopharyngeal swab, presence of viral mutation(s) within the areas targeted by this assay, and inadequate number of viral copies(<138 copies/mL). A negative result must be combined with clinical observations, patient  history, and epidemiological information. The expected result is Negative.  Fact Sheet for Patients:  bloggercourse.com  Fact Sheet for Healthcare Providers:   seriousbroker.it  This test is no t yet approved or cleared by the [place]  FDA and  has been authorized for detection and/or diagnosis of SARS-CoV-2 by FDA under an Emergency Use Authorization (EUA). This EUA will remain  in effect (meaning this test can be used) for the duration of the COVID-19 declaration under Section 564(b)(1) of the Act, 21 U.S.C.section 360bbb-3(b)(1), unless the authorization is terminated  or revoked sooner.       Influenza A by PCR NEGATIVE NEGATIVE Final   Influenza B by PCR NEGATIVE NEGATIVE Final    Comment: (NOTE) The Xpert Xpress SARS-CoV-2/FLU/RSV plus assay is intended as an aid in the diagnosis of influenza from Nasopharyngeal swab specimens and should not be used as a sole basis for treatment. Nasal washings and aspirates are unacceptable for Xpert Xpress SARS-CoV-2/FLU/RSV testing.  Fact Sheet for Patients: bloggercourse.com  Fact Sheet for Healthcare Providers: seriousbroker.it  This test is not yet approved or cleared by the [place]  FDA and has been authorized for detection and/or diagnosis of SARS-CoV-2 by FDA under an Emergency Use Authorization (EUA). This EUA will remain in effect (meaning this test can be used) for the duration of the COVID-19 declaration under Section 564(b)(1) of the Act, 21 U.S.C. section 360bbb-3(b)(1), unless the authorization is terminated or revoked.     Resp Syncytial Virus by PCR NEGATIVE NEGATIVE Final    Comment: (NOTE) Fact Sheet for Patients: bloggercourse.com  Fact Sheet for Healthcare Providers: seriousbroker.it  This test is not yet approved or cleared by the [place]  FDA and has been authorized for detection and/or diagnosis of SARS-CoV-2 by FDA under an Emergency Use Authorization (EUA). This EUA will remain in effect (meaning this test can be used) for  the duration of the COVID-19 declaration under Section 564(b)(1) of the Act, 21 U.S.C. section 360bbb-3(b)(1), unless the authorization is terminated or revoked.  Performed at [facility], [address] [place], [place] [address]   Blood Culture (routine x 2)     Status: None   Collection Time: 10/27/24 12:18 PM   Specimen: Right Antecubital; Blood  Result Value Ref Range Status   Specimen Description RIGHT ANTECUBITAL  Final   Special Requests   Final    BOTTLES DRAWN AEROBIC AND ANAEROBIC Blood Culture adequate volume   Culture   Final    NO GROWTH 5 DAYS Performed at [facility], [address] [place], [place] [address]    Report Status 11/01/2024 FINAL  Final  Blood Culture (routine x 2)     Status: None   Collection Time: 10/27/24 12:18 PM   Specimen: Left Antecubital; Blood  Result Value Ref Range Status   Specimen Description LEFT ANTECUBITAL  Final   Special Requests   Final    BOTTLES DRAWN AEROBIC AND ANAEROBIC Blood Culture results may not be optimal due to an inadequate volume of blood received in culture bottles   Culture   Final    NO GROWTH 5 DAYS Performed at [facility], [address], [place], [place] [address]    Report Status 11/01/2024 FINAL  Final  MRSA Next Gen by PCR, Nasal     Status: Abnormal   Collection Time: 10/27/24  1:38 PM   Specimen: Nasal Mucosa; Nasal Swab  Result Value Ref Range Status   MRSA by PCR Next Gen DETECTED (A)  NOT DETECTED Final    Comment: RESULT CALLED TO, READ BACK BY AND VERIFIED WITH: KATHERINE CLAYTON @1520  10/27/24 MJU (NOTE) The GeneXpert MRSA Assay (FDA approved for NASAL specimens only), is one component of a comprehensive MRSA colonization surveillance program. It is not intended to diagnose MRSA infection nor to guide or monitor treatment for MRSA infections. Test performance is not FDA approved in patients less than 43 years old. Performed at [facility], [address] [place], [place] [address]     Coagulation Studies: No results for input(s): LABPROT, INR in the last 72 hours.   Urinalysis: Recent Labs    10/30/24 0514  COLORURINE AMBER*  LABSPEC 1.016  PHURINE 5.0  GLUCOSEU 150*  HGBUR LARGE*  BILIRUBINUR NEGATIVE  KETONESUR NEGATIVE  PROTEINUR 100*  NITRITE NEGATIVE  LEUKOCYTESUR LARGE*      Imaging: DG Chest Port 1 View Result Date: 10/31/2024 EXAM: 1 VIEW(S) XRAY OF THE CHEST 10/31/2024 10:42:05 AM COMPARISON: 10/27/2024 CLINICAL HISTORY: Wheezing. FINDINGS: LINES, TUBES AND DEVICES: Right internal jugular central line remains in place, unchanged. LUNGS AND PLEURA: Minimal left basilar linear densities most compatible with atelectasis. No pleural effusion. No pneumothorax. HEART AND MEDIASTINUM: Mild cardiomegaly, stable. BONES AND SOFT TISSUES: Prior median sternotomy. No acute osseous abnormality. IMPRESSION: 1. Left base atelectasis. 2. Mild cardiomegaly, stable. Electronically signed by: Franky Crease MD 10/31/2024 02:32 PM EST RP Workstation: [id number]      Medications:    sodium chloride  100 mL/hr at 11/01/24 1243   norepinephrine  (LEVOPHED ) Adult infusion 4 mcg/min (11/01/24 0700)    alteplase   2 mg Intracatheter Once   Chlorhexidine  Gluconate Cloth  6 each Topical Nightly   feeding supplement (GLUCERNA SHAKE)  237 mL Oral TID BM   insulin  aspart  0-5 Units Subcutaneous QHS   insulin  aspart  0-9 Units Subcutaneous TID WC   insulin  glargine-yfgn  7 Units Subcutaneous Daily   latanoprost   1 drop Both Eyes QHS   [START ON 11/02/2024] levothyroxine   50 mcg Oral QAC breakfast   midodrine   10 mg Oral TID WC   multivitamin  1 tablet Oral QHS   pantoprazole  (PROTONIX ) IV  40 mg Intravenous Q12H   phosphorus  500 mg Oral BID   phytonadione   2.5 mg Oral Daily   sodium chloride  flush  3 mL Intravenous Q12H   sodium chloride   2 g Oral TID WC   thiamine   100 mg Oral Daily   acetaminophen , docusate sodium ,  ipratropium-albuterol , ondansetron  (ZOFRAN ) IV, mouth rinse, oxyCODONE , polyethylene glycol, sodium chloride  flush, traZODone   Assessment/ Plan:  Tammy Arias is a 67 y.o.  female presents to ED with weakness and has been admitted for Hemorrhagic shock (HCC) [R57.8] Shock (HCC) [R57.9] Acute GI bleeding [K92.2] Acute renal failure, unspecified acute renal failure type [N17.9]   Acute Kidney Injury with hyperkalemia, with baseline creatinine 0.92 on 08/22/24.  Acute kidney injury appears multifactorial at this time from hypotension and dehydration. Potassium 6.1 on admission. Corrected some with shifting measures. CRRT initiated on 12/1.   Labs have corrected with CRRT and has been weaned to one pressor.  CRRT stopped on 12/4. Urine output slowly improving. Will hold dialysis today and monitor urine output. Will determine need for dialysis in the am.   Lab Results  Component Value Date   CREATININE 1.56 (H) 11/01/2024   CREATININE 2.07 (H) 10/31/2024   CREATININE 1.57 (H) 10/30/2024    Intake/Output Summary (Last 24 hours) at 11/01/2024 1612 Last data filed at  11/01/2024 1204 Gross per 24 hour  Intake 31.08 ml  Output 2240 ml  Net -2208.92 ml    2. Acute metabolic acidosis, S bicarb 9 on ED arrival. S bicarb has corrected with CRRT  3. Anemia with suspected acute blood loss Lab Results  Component Value Date   HGB 7.7 (L) 11/01/2024  Has received blood transfusions during this admission. GI has no plans to repeat EGD due to current and recent workups.   4. Hypotension, hemorrhagic shock. Blood pressure managed with Levo   LOS: 5 Emiliano Welshans 12/5/20254:12 PM

## 2024-11-01 NOTE — Consult Note (Signed)
 PHARMACY CONSULT NOTE  Pharmacy Consult for Electrolyte Monitoring and Replacement   Recent Labs: Potassium (mmol/L)  Date Value  11/01/2024 5.0   Magnesium  (mg/dL)  Date Value  87/94/7974 1.7   Calcium  (mg/dL)  Date Value  87/94/7974 7.2 (L)   Albumin  (g/dL)  Date Value  87/94/7974 3.3 (L)  05/18/2020 4.7   Phosphorus (mg/dL)  Date Value  87/94/7974 1.7 (L)   Sodium (mmol/L)  Date Value  11/01/2024 126 (L)   Assessment: 68 y/o female with h/o ischemic cardiomyopathy, gastric AVM, GI angiodysplasia, IBS-D, IDA, s/p watchman device, cholelithiasis, TIA, hypothyroidism, pulmonary hypertension, GERD, barrett's esophagus, HLD, HTN, OSA, CAD s/p CABG x 3, PAF, CHF, cirrhosis, portal hypertension, gout, CKD III, B12 deficiency, ventral hernia s/p mesh repair 2018, colonic angioectasias, diverticulosis, DDD, hiatal hernia, esophageal varices. Pharmacy is asked to follow and replace electrolytes while in CCU  Goal of Therapy:  Electrolytes WNL  Plan:  --Stable hyponatremia. Defer management to PCCM team --Phos 1.7, K Phos  Neutral 500 mg PO BID x 2 doses --Mg 1.7, magnesium  sulfate 2 g IV x 1 dose --Follow-up electrolytes with AM labs tomorrow  Marolyn KATHEE Mare 11/01/2024 7:52 AM

## 2024-11-01 NOTE — Plan of Care (Signed)
   Problem: Education: Goal: Knowledge of General Education information will improve Description Including pain rating scale, medication(s)/side effects and non-pharmacologic comfort measures Outcome: Progressing   Problem: Clinical Measurements: Goal: Ability to maintain clinical measurements within normal limits will improve Outcome: Progressing   Problem: Activity: Goal: Risk for activity intolerance will decrease Outcome: Progressing

## 2024-11-01 NOTE — TOC Progression Note (Signed)
 Transition of Care [facility]) - Progression Note    Patient Details  Name: Tammy Arias MRN: [id number] Date of Birth: [birth year]  [facility] CM/SW Contact  K'La JINNY Ruts, LCSW Phone Number: 11/01/2024, 11:40 AM  Clinical Narrative:    Chart reviewed. SW spoke with the patient about new recommendation of SNF placement. The patient declined the SNF recommendation. The patient reports that she would like to go with [facility].   SW will check offers for patient.      Barriers to Discharge: Continued Medical Work up               Expected Discharge Plan and Services       Living arrangements for the past 2 months: Single Family Home                                       Social Drivers of Health (SDOH) Interventions SDOH Screenings   Food Insecurity: No Food Insecurity (10/29/2024)  Housing: Low Risk  (10/29/2024)  Transportation Needs: No Transportation Needs (10/29/2024)  Utilities: Not At Risk (10/29/2024)  Depression (PHQ2-9): Low Risk  (09/04/2024)  Financial Resource Strain: Medium Risk (05/20/2024)   Received from [facility]  Social Connections: Moderately Integrated (10/29/2024)  Tobacco Use: Medium Risk (10/27/2024)    Readmission Risk Interventions    10/28/2024    3:01 PM 08/16/2024    1:36 PM 06/11/2024    4:23 PM  Readmission Risk Prevention Plan  Transportation Screening Complete Complete Complete  Medication Review Oceanographer) Complete Complete Complete  PCP or Specialist appointment within 3-5 days of discharge Complete Complete   HRI or Home Care Consult   --  SW Recovery Care/Counseling Consult Complete Complete   Palliative Care Screening Not Applicable Not Applicable Not Applicable  Skilled Nursing Facility Not Applicable Not Applicable Not Applicable

## 2024-11-02 LAB — RENAL FUNCTION PANEL
Albumin: 3.4 g/dL — ABNORMAL LOW (ref 3.5–5.0)
Anion gap: 7 (ref 5–15)
BUN: 19 mg/dL (ref 8–23)
CO2: 25 mmol/L (ref 22–32)
Calcium: 7.3 mg/dL — ABNORMAL LOW (ref 8.9–10.3)
Chloride: 96 mmol/L — ABNORMAL LOW (ref 98–111)
Creatinine, Ser: 1.52 mg/dL — ABNORMAL HIGH (ref 0.44–1.00)
GFR, Estimated: 37 mL/min — ABNORMAL LOW (ref >60)
Glucose, Bld: 153 mg/dL — ABNORMAL HIGH (ref 70–99)
Phosphorus: 1.9 mg/dL — ABNORMAL LOW (ref 2.5–4.6)
Potassium: 4.9 mmol/L (ref 3.5–5.1)
Sodium: 128 mmol/L — ABNORMAL LOW (ref 135–145)

## 2024-11-02 LAB — HEMOGLOBIN AND HEMATOCRIT, BLOOD
HCT: 25.4 % — ABNORMAL LOW (ref 36.0–46.0)
Hemoglobin: 8.2 g/dL — ABNORMAL LOW (ref 12.0–15.0)

## 2024-11-02 LAB — MAGNESIUM: Magnesium: 1.9 mg/dL (ref 1.7–2.4)

## 2024-11-02 LAB — CBC
HCT: 24.7 % — ABNORMAL LOW (ref 36.0–46.0)
Hemoglobin: 8.1 g/dL — ABNORMAL LOW (ref 12.0–15.0)
MCH: 31.2 pg (ref 26.0–34.0)
MCHC: 32.8 g/dL (ref 30.0–36.0)
MCV: 95 fL (ref 80.0–100.0)
Platelets: 49 K/uL — ABNORMAL LOW (ref 150–400)
RBC: 2.6 MIL/uL — ABNORMAL LOW (ref 3.87–5.11)
RDW: 23.9 % — ABNORMAL HIGH (ref 11.5–15.5)
WBC: 6 K/uL (ref 4.0–10.5)
nRBC: 0.3 % — ABNORMAL HIGH (ref 0.0–0.2)

## 2024-11-02 LAB — GLUCOSE, CAPILLARY
Glucose-Capillary: 119 mg/dL — ABNORMAL HIGH (ref 70–99)
Glucose-Capillary: 210 mg/dL — ABNORMAL HIGH (ref 70–99)
Glucose-Capillary: 231 mg/dL — ABNORMAL HIGH (ref 70–99)
Glucose-Capillary: 255 mg/dL — ABNORMAL HIGH (ref 70–99)

## 2024-11-02 MED ORDER — INSULIN ASPART 100 UNIT/ML IJ SOLN
0.0000 [IU] | Freq: Three times a day (TID) | INTRAMUSCULAR | Status: DC
  Administered 2024-11-03: 5 [IU] via SUBCUTANEOUS
  Administered 2024-11-03: 8 [IU] via SUBCUTANEOUS
  Filled 2024-11-02 (×2): qty 5

## 2024-11-02 MED ORDER — FUROSEMIDE 10 MG/ML IJ SOLN
40.0000 mg | Freq: Every day | INTRAMUSCULAR | Status: DC
  Administered 2024-11-02 – 2024-11-04 (×3): 40 mg via INTRAVENOUS
  Filled 2024-11-02 (×3): qty 4

## 2024-11-02 MED ORDER — SODIUM PHOSPHATES 45 MMOLE/15ML IV SOLN
15.0000 mmol | Freq: Once | INTRAVENOUS | Status: AC
  Administered 2024-11-02: 15 mmol via INTRAVENOUS
  Filled 2024-11-02: qty 5

## 2024-11-02 MED ORDER — MAGNESIUM SULFATE 2 GM/50ML IV SOLN
2.0000 g | Freq: Once | INTRAVENOUS | Status: AC
  Administered 2024-11-02: 2 g via INTRAVENOUS
  Filled 2024-11-02: qty 50

## 2024-11-02 NOTE — Progress Notes (Signed)
 NAME:  Tammy Arias, MRN:  [id number], DOB:  11-Oct-1956, LOS: 6 ADMISSION DATE:  10/27/2024    CHIEF COMPLAINT:  Circulatory shock  History of Present Illness:  68 y.o.female with medical problems of heart failure with reduced ejection fraction 25 to 30%, severe mitral regurgitation, moderate right ventricular systolic dysfunction, severe tricuspid regurgitation, atrial fibrillation, failed Watchman device, chronic anticoagulation with apixaban , GI bleed, coronary disease status post CABG in 2005, aortic stenosis repair, pulmonary hypertension, type 2 diabetes, hypertension, liver cirrhosis with grade 2 esophageal varices, C. 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    She reports to ED for generalized malaise and is being admitted for hypotension +GIB from ileostomy   She states she was in her normal state of health on Thanksgiving and was able to eat well. Started feeling bad on Friday. Was unable to eat on Saturday. States she has had a lot of bloody drainage from ileostomy.  had progressive weakness since yesterday.      Significant Hospital Events: Including procedures, antibiotic start and stop dates in addition to other pertinent events   11/30 admitted for hemorraghic shock, given 3-4 Liters of fluid, baseline SBP 90's 10/29/24- patient remains on vasopressors.  Platelet with significant drop overnight but appears to be chronic when trended over past 5 years.  10/30/24- patient remains critically ill, her renal function is improving, remains with electrolyte derrangements including low phos, low Na, low Ca, elevated glucose.  CBC with borderline low Hb, thrombocytopenia, eosinophilia. Blood cultures negative to date.  RRT is ongoing. S/p 1 unit prbc transfusion today.  10/31/24 - patient with severe electrolyte derrangements, hyponatremia and thrombocytopenia. Clinically improved but still critically ill on vasopressor  support 11/01/24- patient restarted on levophed  overnight. No bleeding per ostomy. RIJ TLC +.   Plan to transition to TRH service. 11/02/24- patient required levophed  overnight and remains in ICU.  She is clinically not worse and h/h is trending up. Blood cultures negative. WBC count normal.    Antibiotics Given (last 72 hours)     None        Objective   Blood pressure (!) 99/46, pulse 81, temperature 98 F (36.7 C), temperature source Oral, resp. rate 16, height 5' 1 (1.549 m), weight 73.3 kg, SpO2 100%. CVP:  [14 mmHg-18 mmHg] 18 mmHg      Intake/Output Summary (Last 24 hours) at 11/02/2024 1721 Last data filed at 11/02/2024 1700 Gross per 24 hour  Intake 1118.66 ml  Output 4375 ml  Net -3256.34 ml   Filed Weights   10/31/24 1342 10/31/24 1638 11/01/24 0500  Weight: 66.9 kg 66.4 kg 73.3 kg   GENERAL:NAD age appropriate EYES: Pupils equal, round, reactive to light.  No scleral icterus.  MOUTH: Moist mucosal membrane NECK: Supple.  PULMONARY: Lungs clear to auscultation, +rhonchi, +wheezing CARDIOVASCULAR: S1 and S2.  Regular rate and rhythm GASTROINTESTINAL: Soft, nontender, -distended. Positive bowel sounds. Ileostomy in place MUSCULOSKELETAL: No swelling, clubbing, or edema.  NEUROLOGIC: alert and awake SKIN:normal, warm to touch, Capillary refill delayed  Pulses present bilaterally   Labs/imaging that I havepersonally reviewed  (right click and Reselect all SmartList Selections daily)    ASSESSMENT AND PLAN SYNOPSIS 68 yo white female with multiple medical issues admitted for severe and acute hypovolumic shock GIB from Ileostomy  Hemorrhagic  SHOCK- present on admission SOURCE-GIB -use vasopressors to keep MAP>65 as needed -follow ABG and LA - cultures- negative thus far   2. ACUTE  blood loss ANEMIA- -hx of total colectomy and ileostomy with bleeding from ostomy site at home -GI on case -appreciate input  -TRANSFUSE AS NEEDED IF HGB<7 DVT PRX with  TED/SCD's ONLY    3. Chronic CARDIAC dysfunction   - advanced systolic CHF EF 30% and chronic diastolic CHF   -pulmonary hypertension   - Valvular AF ICU monitoring   4. ACUTE KDIGO 4 on chronic KIDNEY disease  -likely due to ischemia post bleeding -continue Foley Catheter-assess need -Avoid nephrotoxic agents -Follow urine output, BMP -Ensure adequate renal perfusion, optimize oxygenation -Renal dose medications  5. Liver cirrhosis with portal hypertensive disease             - esophageal varices             - recurrent GI bleeding             - coagulopathy            - poor prognosis   Intake/Output Summary (Last 24 hours) at 11/02/2024 1721 Last data filed at 11/02/2024 1700 Gross per 24 hour  Intake 1118.66 ml  Output 4375 ml  Net -3256.34 ml       Latest Ref Rng & Units 11/02/2024    4:38 AM 11/01/2024    5:34 AM 10/31/2024    4:19 AM  BMP  Glucose 70 - 99 mg/dL 846  865  815   BUN 8 - 23 mg/dL 19  16  19    Creatinine 0.44 - 1.00 mg/dL 8.47  8.43  7.92   Sodium 135 - 145 mmol/L 128  126  126   Potassium 3.5 - 5.1 mmol/L 4.9  5.0  5.0   Chloride 98 - 111 mmol/L 96  95  93   CO2 22 - 32 mmol/L 25  27  25    Calcium  8.9 - 10.3 mg/dL 7.3  7.2  7.5     ENDO - ICU hypoglycemic\Hyperglycemia protocol -check FSBS per protocol   GI GI PROPHYLAXIS as indicated  NUTRITIONAL STATUS DIET-->NPO Constipation protocol as indicated Gi consulted  ELECTROLYTES -follow labs as needed -replace as needed -pharmacy consultation and following     Best practice (right click and Reselect all SmartList Selections daily)  Diet:  NPO DVT prophylaxis: Contraindicated GI prophylaxis: PPI Mobility:  bed rest  Code Status:  FULL CODE Disposition: ICU  Labs   CBC: Recent Labs  Lab 10/29/24 0619 10/29/24 1133 10/30/24 0355 10/30/24 1100 10/31/24 0419 10/31/24 0819 11/01/24 0534 11/01/24 1118 11/01/24 1658 11/02/24 0438 11/02/24 1701  WBC 5.4  --  3.6*  --   4.9  --  4.7  --   --  6.0  --   NEUTROABS 4.5  --  2.3  --   --   --   --   --   --   --   --   HGB 8.4*   < > 7.1*   < > 7.8*   < > 7.4* 7.7* 7.6* 8.1* 8.2*  HCT 24.1*   < > 20.9*   < > 23.0*   < > 22.5* 23.0* 23.3* 24.7* 25.4*  MCV 91.6  --  95.0  --  91.3  --  93.8  --   --  95.0  --   PLT 66*  --  40*  --  41*  --  39*  --   --  49*  --    < > = values in this interval not displayed.  Basic Metabolic Panel: Recent Labs  Lab 10/28/24 0431 10/28/24 1602 10/29/24 1556 10/30/24 0355 10/30/24 2232 10/31/24 0419 11/01/24 0534 11/02/24 0438  NA 127*   < > 131* 133*  --  126* 126* 128*  K 5.2*   < > 3.4* 3.5  --  5.0 5.0 4.9  CL 93*   < > 97* 98  --  93* 95* 96*  CO2 12*   < > 25 26  --  25 27 25   GLUCOSE 130*   < > 225* 181*  --  184* 134* 153*  BUN 68*   < > 21 13  --  19 16 19   CREATININE 7.25*   < > 2.23* 1.57*  --  2.07* 1.56* 1.52*  CALCIUM  7.8*   < > 8.1* 8.8*  --  7.5* 7.2* 7.3*  MG 1.5*  --   --  1.7  --  1.7 1.7 1.9  PHOS 5.1*   < > 2.2* 1.6*  1.5* <1.0* 3.4 1.7* 1.9*   < > = values in this interval not displayed.   GFR: Estimated Creatinine Clearance: 32 mL/min (A) (by C-G formula based on SCr of 1.52 mg/dL (H)). Recent Labs  Lab 10/28/24 1135 10/28/24 1325 10/28/24 1602 10/28/24 1930 10/29/24 0619 10/30/24 0355 10/31/24 0419 11/01/24 0534 11/02/24 0438  PROCALCITON  --   --  0.34  --   --   --   --   --   --   WBC  --   --   --   --    < > 3.6* 4.9 4.7 6.0  LATICACIDVEN 3.8* 4.1* 4.6* 4.4*  --   --   --   --   --    < > = values in this interval not displayed.    Liver Function Tests: Recent Labs  Lab 10/27/24 0908 10/28/24 0431 10/28/24 0746 10/28/24 1602 10/29/24 1556 10/30/24 0355 10/31/24 0419 11/01/24 0534 11/02/24 0438  AST 40  --  72*  --   --   --   --   --   --   ALT 26  --  47*  --   --   --   --   --   --   ALKPHOS 120  --  107  --   --   --   --   --   --   BILITOT 0.7  --  1.5*  --   --   --   --   --   --   PROT 6.4*  --   5.8*  --   --   --   --   --   --   ALBUMIN  4.0   < > 3.6   < > 3.4* 3.6 3.3* 3.3* 3.4*   < > = values in this interval not displayed.   Recent Labs  Lab 10/28/24 0746  LIPASE 230*   No results for input(s): AMMONIA in the last 168 hours.  ABG    Component Value Date/Time   PHART 7.47 (H) 03/01/2024 1055   PCO2ART 40 03/01/2024 1055   PO2ART 112 (H) 03/01/2024 1055   HCO3 29.1 (H) 03/01/2024 1055   ACIDBASEDEF 2.7 (H) 02/29/2024 0952   O2SAT 67.3 08/22/2024 0500     Coagulation Profile: Recent Labs  Lab 10/27/24 0908 10/28/24 1039  INR 1.3* 1.4*    Cardiac Enzymes: No results for input(s): CKTOTAL, CKMB, CKMBINDEX, TROPONINI in the last 168 hours.  HbA1C: Hgb A1c  MFr Bld  Date/Time Value Ref Range Status  10/28/2024 06:02 PM 5.5 4.8 - 5.6 % Final    Comment:    (NOTE)         Prediabetes: 5.7 - 6.4         Diabetes: >6.4         Glycemic control for adults with diabetes: <7.0   02/28/2024 06:08 AM 5.9 (H) 4.8 - 5.6 % Final    Comment:    (NOTE) Pre diabetes:          5.7%-6.4%  Diabetes:              >6.4%  Glycemic control for   <7.0% adults with diabetes     CBG: Recent Labs  Lab 11/01/24 1623 11/01/24 2103 11/02/24 0756 11/02/24 1200 11/02/24 1609  GLUCAP 210* 206* 119* 210* 231*    Allergies Allergies  Allergen Reactions   Vioxx [Rofecoxib] Swelling       Critical care provider statement:   Total critical care time: 33 minutes   Performed by: Parris MD   Critical care time was exclusive of separately billable procedures and treating other patients.   Critical care was necessary to treat or prevent imminent or life-threatening deterioration.   Critical care was time spent personally by me on the following activities: development of treatment plan with patient and/or surrogate as well as nursing, discussions with consultants, evaluation of patient's response to treatment, examination of patient, obtaining history from  patient or surrogate, ordering and performing treatments and interventions, ordering and review of laboratory studies, ordering and review of radiographic studies, pulse oximetry and re-evaluation of patient's condition.    Laynie Espy, M.D.  Pulmonary & Critical Care Medicine

## 2024-11-02 NOTE — Consult Note (Addendum)
 PHARMACY CONSULT NOTE  Pharmacy Consult for Electrolyte Monitoring and Replacement   Recent Labs: Potassium (mmol/L)  Date Value  11/02/2024 4.9   Magnesium  (mg/dL)  Date Value  87/93/7974 1.9   Calcium  (mg/dL)  Date Value  87/93/7974 7.3 (L)   Albumin  (g/dL)  Date Value  87/93/7974 3.4 (L)  05/18/2020 4.7   Phosphorus (mg/dL)  Date Value  87/93/7974 1.9 (L)   Sodium (mmol/L)  Date Value  11/02/2024 128 (L)   Assessment: 68 y/o female with h/o ischemic cardiomyopathy, gastric AVM, GI angiodysplasia, IBS-D, IDA, s/p watchman device, cholelithiasis, TIA, hypothyroidism, pulmonary hypertension, GERD, barrett's esophagus, HLD, HTN, OSA, CAD s/p CABG x 3, PAF, CHF, cirrhosis, portal hypertension, gout, CKD III, B12 deficiency, ventral hernia s/p mesh repair 2018, colonic angioectasias, diverticulosis, DDD, hiatal hernia, esophageal varices. Pharmacy is asked to follow and replace electrolytes while in CCU  Goal of Therapy:  Electrolytes WNL  Plan:  --Stable hyponatremia. Defer management to PCCM team --Phos 1.9, Will order Sodium Phos 15mmol xIV x 1 dose --Provider has ordered Magnesium  2g IV x 1 dose --Follow-up electrolytes with AM labs tomorrow  Estill CHRISTELLA Lutes, PharmD, BCPS Clinical Pharmacist 11/02/2024 7:14 AM

## 2024-11-02 NOTE — Progress Notes (Addendum)
 Palliative Care Progress Note, Assessment & Plan   Patient Name: Tammy Arias       Date: 11/02/2024 DOB: [birth year]  Age: 68 y.o. MRN#: [id number] Attending Physician: Parris Manna, MD Primary Care Physician: Valora Lynwood FALCON, MD Admit Date: 10/27/2024  Subjective: Feels better today. Eating meals. Sleeping well. Complains of bilateral leg pain from fluid. Denies CP, endorses SOB while talking and eating.  HPI: Per previous HPI- 68 y.o.female with medical problems of heart failure with reduced ejection fraction 25 to 30%, severe mitral regurgitation, moderate right ventricular systolic dysfunction, severe tricuspid regurgitation, atrial fibrillation, failed Watchman device, chronic anticoagulation with apixaban , GI bleed, coronary disease status post CABG in 2005, aortic stenosis repair, pulmonary hypertension, type 2 diabetes, hypertension, liver cirrhosis with grade 2 esophageal varices, C. 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   Palliative medicine consulted for assistance with goals of care conversations.   Summary of counseling/coordination of care: Extensive chart review completed prior to meeting patient including labs, vital signs, imaging, progress notes, orders, and available advanced directive documents from current and previous encounters.   After reviewing the patient's chart and assessing the patient at bedside, I spoke with patient and nephew in regards to symptom management and goals of care.      Latest Ref Rng & Units 11/02/2024    4:38 AM 11/01/2024    4:58 PM 11/01/2024   11:18 AM  CBC  WBC 4.0 - 10.5 K/uL 6.0     Hemoglobin 12.0 - 15.0 g/dL 8.1  7.6  7.7   Hematocrit 36.0 - 46.0 % 24.7  23.3  23.0   Platelets 150 - 400 K/uL 49          Latest Ref Rng & Units 11/02/2024    4:38 AM 11/01/2024    5:34 AM 10/31/2024    4:19 AM  CMP  Glucose 70 - 99 mg/dL 846  865  815   BUN 8 - 23 mg/dL 19  16  19    Creatinine 0.44 - 1.00 mg/dL 8.47  8.43  7.92   Sodium 135 - 145 mmol/L 128  126  126   Potassium 3.5 - 5.1 mmol/L 4.9  5.0  5.0   Chloride 98 - 111 mmol/L 96  95  93   CO2 22 - 32 mmol/L 25  27  25    Calcium  8.9 - 10.3 mg/dL 7.3  7.2  7.5      Elderly female sitting upright in bed eating lunch with nephew at bedside. She is alert and oriented to self, time, location and situation. She is able to participate in conversation. Respirations are even and unlabored. She is in no distress.  Weaned to one pressor at this time.   Ms. Tammy Arias shares that if her kidney functions continues, she will no longer have to do dialysis. She shares that if they are able to remove medication for BP, she can move out of ICU to medical floor and then home. She states she does not want to go to rehab and wants home health instead as she has multiple family members that are willing to help her.   When discussing possible  need for future dialysis, she shares that she would be willing to do long-term OP dialysis if needed. She verbalizes understanding of significant heart failure and home management with medication compliance.   She confirms that she is a DNR with no intubation.   Therapeutic silence and active listening provided for patient and nephew to share their thoughts and emotions regarding current medical situation.  Emotional support provided.  Physical Exam Vitals reviewed.  Constitutional:      General: She is not in acute distress.    Appearance: She is not ill-appearing.  HENT:     Head: Normocephalic and atraumatic.     Mouth/Throat:     Mouth: Mucous membranes are moist.  Pulmonary:     Effort: Pulmonary effort is normal. No respiratory distress.  Musculoskeletal:     Right lower leg: Edema present.     Left lower  leg: Edema present.  Skin:    General: Skin is warm and dry.  Neurological:     Mental Status: She is alert and oriented to person, place, and time.  Psychiatric:        Mood and Affect: Mood normal.        Behavior: Behavior normal.        Thought Content: Thought content normal.        Judgment: Judgment normal.     Recommendations/Plan: DNR/DNI Continue current supportive interventions Patient prefers to d/c home with [facility]- does not want SNF PMT will continue to follow      Total Time 50 minutes   Time spent includes: Detailed review of medical records (labs, imaging, vital signs), medically appropriate exam (mental status, respiratory, cardiac, skin), discussed with treatment team, counseling and educating patient, family and staff, documenting clinical information, medication management and coordination of care.     Devere Sacks, AMANDA [facility] Palliative Medicine Team  11/02/2024 9:52 AM  Office [phone]  Pager [phone]

## 2024-11-02 NOTE — Plan of Care (Signed)
 Continuing with plan of care.

## 2024-11-02 NOTE — Progress Notes (Signed)
 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 She reports to ED for generalized malaise and is being admitted for Hemorrhagic shock (HCC) [R57.8] Shock (HCC) [R57.9] Acute GI bleeding [K92.2] Acute renal failure, unspecified acute renal failure type [N17.9]  Patient is known to our practice from previous admissions and has seen Dr Dennise in he office once.   Update: Patient sitting up in bed Alert and oriented Completed breakfast tray at bedside  Urine output 1.8L  Objective:  Vital signs in last 24 hours:  Temp:  [98.2 F (36.8 C)-98.8 F (37.1 C)] 98.2 F (36.8 C) (12/06 0800) Pulse Rate:  [35-118] 85 (12/06 1130) Resp:  [11-27] 15 (12/06 1130) BP: (78-123)/(40-95) 111/60 (12/06 1130) SpO2:  [95 %-100 %] 100 % (12/06 1130)  Weight change:  Filed Weights   10/31/24 1342 10/31/24 1638 11/01/24 0500  Weight: 66.9 kg 66.4 kg 73.3 kg    Intake/Output: I/O last 3 completed shifts: In: 832.1 [P.O.:120; I.V.:693.5; IV Piggyback:18.6] Out: 3040 [Urine:2000; Stool:1040]   Intake/Output this shift:  Total I/O In: 97.6 [I.V.:17.2; IV Piggyback:80.3] Out: 1750 [Urine:1300; Stool:450]  Physical Exam: General: NAD  Head: Normocephalic, atraumatic. Moist oral mucosa  Eyes: Anicteric  Lungs:  Clear to auscultation, normal effort  Heart: Regular rate and rhythm  Abdomen:  Soft, nontender. Ileostomy    Extremities:  1+ peripheral edema.  Neurologic: Awake, alert, conversant  Skin: Warm,dry, no rash  Access: Rt groin Trialysis     Basic Metabolic Panel: Recent Labs  Lab 10/28/24 0431 10/28/24 1602 10/29/24 1556 10/30/24 0355 10/30/24 2232 10/31/24 0419 11/01/24 0534 11/02/24 0438  NA 127*   < > 131* 133*  --  126* 126* 128*  K 5.2*   < > 3.4* 3.5  --  5.0 5.0 4.9  CL 93*   < > 97* 98  --  93* 95* 96*  CO2 12*   < > 25 26  --  25 27 25   GLUCOSE 130*   < > 225* 181*  --  184* 134* 153*  BUN 68*   < > 21 13  --  19 16 19   CREATININE 7.25*   < > 2.23* 1.57*  --  2.07* 1.56* 1.52*  CALCIUM  7.8*   < > 8.1* 8.8*  --  7.5* 7.2* 7.3*  MG 1.5*  --   --  1.7  --  1.7 1.7 1.9  PHOS 5.1*   < > 2.2* 1.6*  1.5* <1.0* 3.4 1.7* 1.9*   < > = values in this interval not displayed.    Liver Function Tests: Recent Labs  Lab 10/27/24 0908 10/28/24 0431 10/28/24 0746 10/28/24 1602 10/29/24 1556 10/30/24 0355 10/31/24 0419 11/01/24 0534 11/02/24 0438  AST 40  --  72*  --   --   --   --   --   --   ALT 26  --  47*  --   --   --   --   --   --   ALKPHOS 120  --  107  --   --   --   --   --   --   BILITOT 0.7  --  1.5*  --   --   --   --   --   --   PROT 6.4*  --  5.8*  --   --   --   --   --   --   ALBUMIN  4.0   < > 3.6   < > 3.4* 3.6 3.3* 3.3* 3.4*   < > = values in this interval not displayed.   Recent Labs  Lab 10/28/24 0746  LIPASE 230*   No results for input(s): AMMONIA in the last 168 hours.  CBC: Recent Labs  Lab 10/29/24 0619 10/29/24 1133 10/30/24 0355 10/30/24 1100 10/31/24 0419 10/31/24 0819 10/31/24 2355 11/01/24 0534 11/01/24 1118 11/01/24 1658 11/02/24 0438  WBC 5.4  --  3.6*  --  4.9  --   --  4.7  --   --  6.0  NEUTROABS 4.5  --  2.3  --   --   --   --   --   --   --   --   HGB 8.4*   < > 7.1*   < > 7.8*   < > 7.2* 7.4* 7.7* 7.6* 8.1*  HCT 24.1*   < > 20.9*   < > 23.0*   < > 22.0* 22.5* 23.0* 23.3* 24.7*  MCV 91.6  --  95.0  --  91.3  --   --   93.8  --   --  95.0  PLT 66*  --  40*  --  41*  --   --  39*  --   --  49*   < > = values in this interval not displayed.    Cardiac Enzymes: No results for input(s): CKTOTAL, CKMB, CKMBINDEX, TROPONINI in the last 168 hours.  BNP: Invalid input(s): POCBNP  CBG: Recent Labs  Lab 11/01/24 1118 11/01/24 1623 11/01/24 2103 11/02/24 0756 11/02/24 1200  GLUCAP 149* 210* 206* 119* 210*    Microbiology: Results for orders placed or performed during the hospital encounter of 10/27/24  Resp panel by RT-PCR (RSV, Flu A&B, Covid) Anterior Nasal Swab     Status: None   Collection Time: 10/27/24 11:01 AM   Specimen: Anterior Nasal Swab  Result Value Ref Range Status   SARS Coronavirus 2 by RT PCR NEGATIVE NEGATIVE Final    Comment: (NOTE) SARS-CoV-2 target nucleic acids are NOT DETECTED.  The SARS-CoV-2 RNA is generally detectable in upper respiratory specimens during the acute phase of infection. The lowest concentration of SARS-CoV-2 viral copies this assay can detect is 138 copies/mL. A negative result does not preclude SARS-Cov-2 infection and should not be used as the sole basis for treatment or other patient management decisions. A negative result may occur with  improper specimen collection/handling, submission of specimen other than nasopharyngeal swab, presence of viral mutation(s) within the areas targeted by this assay, and inadequate number of viral copies(<138 copies/mL). A negative result must be combined with clinical observations, patient history, and epidemiological information. The expected result is Negative.  Fact Sheet for Patients:  bloggercourse.com  Fact Sheet for Healthcare Providers:  seriousbroker.it  This test is no t yet approved or cleared by the [place]  FDA and  has been authorized for detection and/or diagnosis of SARS-CoV-2 by FDA under an Emergency Use Authorization (EUA). This  EUA will remain  in effect (meaning this  test can be used) for the duration of the COVID-19 declaration under Section 564(b)(1) of the Act, 21 U.S.C.section 360bbb-3(b)(1), unless the authorization is terminated  or revoked sooner.       Influenza A by PCR NEGATIVE NEGATIVE Final   Influenza B by PCR NEGATIVE NEGATIVE Final    Comment: (NOTE) The Xpert Xpress SARS-CoV-2/FLU/RSV plus assay is intended as an aid in the diagnosis of influenza from Nasopharyngeal swab specimens and should not be used as a sole basis for treatment. Nasal washings and aspirates are unacceptable for Xpert Xpress SARS-CoV-2/FLU/RSV testing.  Fact Sheet for Patients: bloggercourse.com  Fact Sheet for Healthcare Providers: seriousbroker.it  This test is not yet approved or cleared by the [place]  FDA and has been authorized for detection and/or diagnosis of SARS-CoV-2 by FDA under an Emergency Use Authorization (EUA). This EUA will remain in effect (meaning this test can be used) for the duration of the COVID-19 declaration under Section 564(b)(1) of the Act, 21 U.S.C. section 360bbb-3(b)(1), unless the authorization is terminated or revoked.     Resp Syncytial Virus by PCR NEGATIVE NEGATIVE Final    Comment: (NOTE) Fact Sheet for Patients: bloggercourse.com  Fact Sheet for Healthcare Providers: seriousbroker.it  This test is not yet approved or cleared by the [place]  FDA and has been authorized for detection and/or diagnosis of SARS-CoV-2 by FDA under an Emergency Use Authorization (EUA). This EUA will remain in effect (meaning this test can be used) for the duration of the COVID-19 declaration under Section 564(b)(1) of the Act, 21 U.S.C. section 360bbb-3(b)(1), unless the authorization is terminated or revoked.  Performed at [facility], [address] [place], [place]  [address]   Blood Culture (routine x 2)     Status: None   Collection Time: 10/27/24 12:18 PM   Specimen: Right Antecubital; Blood  Result Value Ref Range Status   Specimen Description RIGHT ANTECUBITAL  Final   Special Requests   Final    BOTTLES DRAWN AEROBIC AND ANAEROBIC Blood Culture adequate volume   Culture   Final    NO GROWTH 5 DAYS Performed at [facility], [address] [place], [place] [address]    Report Status 11/01/2024 FINAL  Final  Blood Culture (routine x 2)     Status: None   Collection Time: 10/27/24 12:18 PM   Specimen: Left Antecubital; Blood  Result Value Ref Range Status   Specimen Description LEFT ANTECUBITAL  Final   Special Requests   Final    BOTTLES DRAWN AEROBIC AND ANAEROBIC Blood Culture results may not be optimal due to an inadequate volume of blood received in culture bottles   Culture   Final    NO GROWTH 5 DAYS Performed at [facility], [address], [place], [place] [address]    Report Status 11/01/2024 FINAL  Final  MRSA Next Gen by PCR, Nasal     Status: Abnormal   Collection Time: 10/27/24  1:38 PM   Specimen: Nasal Mucosa; Nasal Swab  Result Value Ref Range Status   MRSA by PCR Next Gen DETECTED (A) NOT DETECTED Final    Comment: RESULT CALLED TO, READ BACK BY AND VERIFIED WITH: KATHERINE CLAYTON @1520  10/27/24 MJU (NOTE) The GeneXpert MRSA Assay (FDA approved for NASAL specimens only), is one component of a comprehensive MRSA colonization surveillance program. It is not intended to diagnose MRSA infection nor to guide or monitor treatment for MRSA infections. Test performance is not FDA approved in patients less  than 48 years old. Performed at [facility], [address] [place], [place] [address]     Coagulation Studies: No results for input(s): LABPROT, INR in the last 72 hours.   Urinalysis: No results for input(s): COLORURINE, LABSPEC, PHURINE, GLUCOSEU, HGBUR, BILIRUBINUR,  KETONESUR, PROTEINUR, UROBILINOGEN, NITRITE, LEUKOCYTESUR in the last 72 hours.  Invalid input(s): APPERANCEUR     Imaging: No results found.     Medications:    norepinephrine  (LEVOPHED ) Adult infusion 5 mcg/min (11/02/24 1200)   sodium PHOSPHATE  IVPB (in mmol) 43 mL/hr at 11/02/24 1200    alteplase   2 mg Intracatheter Once   Chlorhexidine  Gluconate Cloth  6 each Topical Nightly   feeding supplement (GLUCERNA SHAKE)  237 mL Oral TID BM   furosemide   40 mg Intravenous Daily   insulin  aspart  0-5 Units Subcutaneous QHS   insulin  aspart  0-9 Units Subcutaneous TID WC   insulin  glargine-yfgn  7 Units Subcutaneous Daily   latanoprost   1 drop Both Eyes QHS   levothyroxine   50 mcg Oral QAC breakfast   midodrine   10 mg Oral TID WC   multivitamin  1 tablet Oral QHS   pantoprazole  (PROTONIX ) IV  40 mg Intravenous Q12H   phytonadione   2.5 mg Oral Daily   sodium chloride  flush  3 mL Intravenous Q12H   thiamine   100 mg Oral Daily   acetaminophen , docusate sodium , ipratropium-albuterol , ondansetron  (ZOFRAN ) IV, mouth rinse, oxyCODONE , polyethylene glycol, sodium chloride  flush, traZODone   Assessment/ Plan:  Tammy Arias is a 68 y.o.  female presents to ED with weakness and has been admitted for Hemorrhagic shock (HCC) [R57.8] Shock (HCC) [R57.9] Acute GI bleeding [K92.2] Acute renal failure, unspecified acute renal failure type [N17.9]   Acute Kidney Injury with hyperkalemia, with baseline creatinine 0.92 on 08/22/24.  Acute kidney injury appears multifactorial at this time from hypotension and dehydration. Potassium 6.1 on admission. Corrected some with shifting measures. CRRT initiated on 12/1.   Labs have corrected with CRRT and has been weaned to one pressor.  CRRT stopped on 12/4. Patient given IV furosemide  40mg  yesterday with adequate urine output, 1.8L. Will continue to hold dialysis for now. Will order IV furosemide  40mg  daily. If labs remains stable,  tomorrow, can remove HD temp cath   Lab Results  Component Value Date   CREATININE 1.52 (H) 11/02/2024   CREATININE 1.56 (H) 11/01/2024   CREATININE 2.07 (H) 10/31/2024    Intake/Output Summary (Last 24 hours) at 11/02/2024 1218 Last data filed at 11/02/2024 1212 Gross per 24 hour  Intake 888.96 ml  Output 3750 ml  Net -2861.04 ml    2. Acute metabolic acidosis, S bicarb 9 on ED arrival. S bicarb has corrected   3. Anemia with suspected acute blood loss Lab Results  Component Value Date   HGB 8.1 (L) 11/02/2024  Has received blood transfusions during this admission. GI has no plans to repeat EGD due to current and recent workups.   4. Hypotension, hemorrhagic shock. Remains on low dose of Levo   LOS: 6 Yue Glasheen 12/6/202512:18 PM

## 2024-11-03 LAB — GLUCOSE, CAPILLARY
Glucose-Capillary: 120 mg/dL — ABNORMAL HIGH (ref 70–99)
Glucose-Capillary: 250 mg/dL — ABNORMAL HIGH (ref 70–99)
Glucose-Capillary: 252 mg/dL — ABNORMAL HIGH (ref 70–99)
Glucose-Capillary: 219 mg/dL — ABNORMAL HIGH (ref 70–99)

## 2024-11-03 LAB — BASIC METABOLIC PANEL WITH GFR
Anion gap: 7 (ref 5–15)
BUN: 23 mg/dL (ref 8–23)
CO2: 25 mmol/L (ref 22–32)
Calcium: 7.4 mg/dL — ABNORMAL LOW (ref 8.9–10.3)
Chloride: 95 mmol/L — ABNORMAL LOW (ref 98–111)
Creatinine, Ser: 1.43 mg/dL — ABNORMAL HIGH (ref 0.44–1.00)
GFR, Estimated: 40 mL/min — ABNORMAL LOW (ref >60)
Glucose, Bld: 144 mg/dL — ABNORMAL HIGH (ref 70–99)
Potassium: 4.7 mmol/L (ref 3.5–5.1)
Sodium: 128 mmol/L — ABNORMAL LOW (ref 135–145)

## 2024-11-03 LAB — MAGNESIUM: Magnesium: 1.9 mg/dL (ref 1.7–2.4)

## 2024-11-03 LAB — CBC
HCT: 23.9 % — ABNORMAL LOW (ref 36.0–46.0)
Hemoglobin: 7.8 g/dL — ABNORMAL LOW (ref 12.0–15.0)
MCH: 31.6 pg (ref 26.0–34.0)
MCHC: 32.6 g/dL (ref 30.0–36.0)
MCV: 96.8 fL (ref 80.0–100.0)
Platelets: 52 K/uL — ABNORMAL LOW (ref 150–400)
RBC: 2.47 MIL/uL — ABNORMAL LOW (ref 3.87–5.11)
RDW: 24.3 % — ABNORMAL HIGH (ref 11.5–15.5)
WBC: 6.1 K/uL (ref 4.0–10.5)
nRBC: 0 % (ref 0.0–0.2)

## 2024-11-03 LAB — PRO BRAIN NATRIURETIC PEPTIDE: Pro Brain Natriuretic Peptide: 6351 pg/mL — ABNORMAL HIGH (ref <300.0)

## 2024-11-03 LAB — COOXEMETRY PANEL
Carboxyhemoglobin: 2.5 % — ABNORMAL HIGH (ref 0.5–1.5)
Methemoglobin: <0.7 % (ref 0.0–1.5)
O2 Saturation: 69.8 %
Total hemoglobin: 8.9 g/dL — ABNORMAL LOW (ref 12.0–16.0)
Total oxygen content: 67.7 %

## 2024-11-03 LAB — HEMOGLOBIN AND HEMATOCRIT, BLOOD
HCT: 26.7 % — ABNORMAL LOW (ref 36.0–46.0)
HCT: 22.9 % — ABNORMAL LOW (ref 36.0–46.0)
Hemoglobin: 8.5 g/dL — ABNORMAL LOW (ref 12.0–15.0)
Hemoglobin: 7.6 g/dL — ABNORMAL LOW (ref 12.0–15.0)

## 2024-11-03 LAB — LACTIC ACID, PLASMA: Lactic Acid, Venous: 1.3 mmol/L (ref 0.5–1.9)

## 2024-11-03 LAB — PHOSPHORUS: Phosphorus: 2 mg/dL — ABNORMAL LOW (ref 2.5–4.6)

## 2024-11-03 MED ORDER — INSULIN ASPART 100 UNIT/ML IJ SOLN
0.0000 [IU] | Freq: Three times a day (TID) | INTRAMUSCULAR | Status: DC
  Administered 2024-11-04: 3 [IU] via SUBCUTANEOUS
  Administered 2024-11-04: 15 [IU] via SUBCUTANEOUS
  Administered 2024-11-04: 11 [IU] via SUBCUTANEOUS
  Filled 2024-11-03: qty 3
  Filled 2024-11-03: qty 11
  Filled 2024-11-03: qty 15

## 2024-11-03 MED ORDER — SODIUM PHOSPHATES 45 MMOLE/15ML IV SOLN
20.0000 mmol | Freq: Once | INTRAVENOUS | Status: AC
  Administered 2024-11-03: 20 mmol via INTRAVENOUS
  Filled 2024-11-03: qty 6.67

## 2024-11-03 MED ORDER — NOREPINEPHRINE 4 MG/250ML-% IV SOLN
0.0000 ug/min | INTRAVENOUS | Status: DC
  Administered 2024-11-03: 5 ug/min via INTRAVENOUS
  Administered 2024-11-04: 3 ug/min via INTRAVENOUS
  Filled 2024-11-03 (×2): qty 250

## 2024-11-03 MED ORDER — SODIUM CHLORIDE 0.9 % IV SOLN
250.0000 mL | INTRAVENOUS | Status: AC
  Administered 2024-11-03: 250 mL via INTRAVENOUS

## 2024-11-03 MED ORDER — VASOPRESSIN 20 UNITS/100 ML INFUSION FOR SHOCK: 0.0000 [IU]/min | INTRAVENOUS | Status: DC

## 2024-11-03 MED ORDER — MIDODRINE HCL 5 MG PO TABS
15.0000 mg | ORAL_TABLET | Freq: Three times a day (TID) | ORAL | Status: DC
  Administered 2024-11-03 – 2024-11-18 (×45): 15 mg via ORAL
  Filled 2024-11-03 (×46): qty 3

## 2024-11-03 MED ORDER — FUROSEMIDE 10 MG/ML IJ SOLN
40.0000 mg | Freq: Once | INTRAMUSCULAR | Status: AC
  Administered 2024-11-03: 40 mg via INTRAVENOUS
  Filled 2024-11-03: qty 4

## 2024-11-03 MED ORDER — MIDODRINE HCL 5 MG PO TABS
5.0000 mg | ORAL_TABLET | Freq: Once | ORAL | Status: AC
  Administered 2024-11-03: 5 mg via ORAL
  Filled 2024-11-03: qty 1

## 2024-11-03 MED ORDER — ALBUMIN HUMAN 25 % IV SOLN
25.0000 g | Freq: Once | INTRAVENOUS | Status: DC
  Administered 2024-11-03: 12.5 g via INTRAVENOUS
  Filled 2024-11-03: qty 100

## 2024-11-03 MED ORDER — PANTOPRAZOLE SODIUM 40 MG PO TBEC
40.0000 mg | DELAYED_RELEASE_TABLET | Freq: Two times a day (BID) | ORAL | Status: DC
  Administered 2024-11-04 – 2024-11-08 (×10): 40 mg via ORAL
  Filled 2024-11-03 (×10): qty 1

## 2024-11-03 NOTE — Consult Note (Signed)
 CARDIOLOGY CONSULT NOTE               Patient ID: Tammy Arias MRN: [id number] DOB/AGE: [birth year] 68 y.o.  Admit date: 10/27/2024 Referring Physician Dr. Faud Aleskerov intensivist Primary Physician Dr. Valora primary Primary Cardiologist [facility] Tammy Arias Tammy Arias heart failure Reason for Consultation atrial fibrillation GI bleed pulmonary hypertension systolic heart failure  HPI: 68 year old female with multiple medical problems including acute on chronic HFrEF EF around 25 to 30% severe MR and TR RV dysfunction paroxysmal atrial fibrillation failed Watchman while on apixaban  with recurrent GI bleed anticoagulation has been held multivessel coronary disease coronary bypass surgery 2005 cor triatriatum aortic stenosis repair hypertension now hypotensive diabetes liver cirrhosis with grade 2 esophageal varices C. difficile colitis April 2025 exploratory lap with total colectomy and end ileostomy resulting in multiple admissions dehydration heart failure patient underwent successful MitraClip deployment 04/16/2024.  Patient presented with hemorrhagic shock and bleeding from ileostomy site still with chest pain lower extremity edema weakness fatigue not feeling well.  Persistent hypotension Limited response to pressors  Review of systems complete and found to be negative unless listed above     Past Medical History:  Diagnosis Date   AC (acromioclavicular) joint bone spurs, right    Anemia    Arthritis    Atrial fibrillation (HCC)    a.) CHA2DS2-VASc = 7 (age, sex, HFrEF, HTN, TIA x 2, T2DM). b.) rate/rhythm controlled on oral carvedilol ; chronically anticoagulated with warfarin.   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 b.) R/LHC 03/29/2017: small RCA with occluded SVG; no  significant Dz. Chronically occluded LAD with patent SVG to D1/LAD. Insignificant Dz in LCx. LM normal.   Cortical cataract    DOE (dyspnea on exertion)    DOE (dyspnea on exertion)    GERD (gastroesophageal reflux disease)    HFrEF (heart failure with reduced ejection fraction) (HCC)    a.) TTE 10/27/2014: EF 40%; glob HK; mild BAE; triv AR, mild MR/PR, mod TR.  b.) TTE 03/15/2017: EF 30%; glob HK; mod BAE; mod pHTN (RVSP 54.8 mmHg); mild PR, mod MR; sev TR.  c.) R/LHC 03/29/2017: EF 30-35%. d.)TTE 10/25/2018: EF 35%; mod BAE; mod BVE; glob HK; triv PR, mod MR, sev TR; RVSP 57.7 mmHg; G2DD.   History of shingles 2004   Hyperlipidemia    Hypertension    Hypothyroidism    IBS (irritable bowel syndrome)    Ischemic cardiomyopathy    a.) TTE 10/27/2014: EF 40%. b.) TTE 03/15/2017: EF 30%. c.) R/LHC 04/01/2017: EF 30-35%. d.) TTE 10/25/2018: EF 35%   Lower GI bleed 06/07/2023   Lumbar scoliosis    Lumbar spinal stenosis    Melena 12/17/2018   Migraines    Osteoporosis    Pancolitis 02/28/2024   Pulmonary HTN (HCC)    a.) TTE 03/15/2017: EF 30-35%; RVSP 54.8 mmHg. 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   Sleep apnea    T2DM (type 2 diabetes mellitus) (HCC)    TIA (transient ischemic attack) 05/29/2016   Vitamin B 12 deficiency     Past Surgical History:  Procedure Laterality  Date   CARDIAC CATHETERIZATION     CATARACT EXTRACTION W/ INTRAOCULAR LENS IMPLANT Bilateral    Cataract Extraction with IOL   COLECTOMY  02/28/2024   Procedure: COLECTOMY, TOTAL;  Surgeon: Lane Shope, MD;  Location: [facility] ORS;  Service: General;;   COLONOSCOPY N/A 12/19/2018   Procedure: COLONOSCOPY;  Surgeon: Janalyn Keene NOVAK, MD;  Location: [facility] ENDOSCOPY;  Service: Endoscopy;  Laterality: N/A;   COLONOSCOPY WITH PROPOFOL  N/A 06/17/2020   Procedure: COLONOSCOPY WITH PROPOFOL ;  Surgeon: Janalyn Keene NOVAK, MD;  Location: [facility] ENDOSCOPY;  Service: Endoscopy;  Laterality: N/A;   COLONOSCOPY WITH PROPOFOL  N/A 03/30/2023   Procedure: COLONOSCOPY WITH PROPOFOL ;  Surgeon: Onita Elspeth Sharper, DO;  Location: [facility] ENDOSCOPY;  Service: Endoscopy;  Laterality: N/A;   COR TRIATRIATUM REPAIR N/A 01/28/2004   CORONARY ARTERY BYPASS GRAFT N/A 01/28/2004   3v CABG   ESOPHAGOGASTRODUODENOSCOPY N/A 12/19/2018   Procedure: ESOPHAGOGASTRODUODENOSCOPY (EGD);  Surgeon: Janalyn Keene NOVAK, MD;  Location: [facility] ENDOSCOPY;  Service: Endoscopy;  Laterality: N/A;   ESOPHAGOGASTRODUODENOSCOPY N/A 06/11/2024   Procedure: EGD (ESOPHAGOGASTRODUODENOSCOPY);  Surgeon: Jinny Carmine, MD;  Location: [facility] ENDOSCOPY;  Service: Endoscopy;  Laterality: N/A;   ESOPHAGOGASTRODUODENOSCOPY N/A 07/18/2024   Procedure: EGD (ESOPHAGOGASTRODUODENOSCOPY);  Surgeon: Onita Elspeth Sharper, DO;  Location: [facility] ENDOSCOPY;  Service: Gastroenterology;  Laterality: N/A;  with Enteroscopy        DM on Plavix   ESOPHAGOGASTRODUODENOSCOPY (EGD) WITH PROPOFOL  N/A 06/17/2020   Procedure: ESOPHAGOGASTRODUODENOSCOPY (EGD) WITH PROPOFOL ;  Surgeon: Janalyn Keene NOVAK, MD;  Location: [facility] ENDOSCOPY;  Service: Endoscopy;  Laterality: N/A;   ESOPHAGOGASTRODUODENOSCOPY (EGD) WITH PROPOFOL  N/A 03/30/2023   Procedure: ESOPHAGOGASTRODUODENOSCOPY (EGD) WITH PROPOFOL ;  Surgeon: Onita Elspeth Sharper, DO;  Location: [facility] ENDOSCOPY;  Service: Endoscopy;  Laterality: N/A;   GIVENS CAPSULE STUDY N/A 07/21/2024   Procedure: IMAGING PROCEDURE, GI TRACT, INTRALUMINAL, VIA CAPSULE;  Surgeon: Therisa Bi, MD;  Location: [facility] ENDOSCOPY;  Service: Gastroenterology;  Laterality: N/A;   LAPAROTOMY N/A 02/28/2024   Procedure: LAPAROTOMY, EXPLORATORY;  Surgeon: Lane Shope, MD;  Location: [facility] ORS;  Service: General;  Laterality: N/A;   LEFT ATRIAL APPENDAGE OCCLUSION     RIGHT/LEFT HEART CATH AND CORONARY ANGIOGRAPHY Bilateral 03/29/2017   Procedure: Right/Left  Heart Cath and Coronary Angiography;  Surgeon: Vinie DELENA Jude, MD;  Location: [facility] INVASIVE CV LAB;  Service: Cardiovascular;  Laterality: Bilateral;   TOTAL KNEE ARTHROPLASTY Right 04/05/2022   Procedure: TOTAL KNEE ARTHROPLASTY;  Surgeon: Kathlynn Sharper, MD;  Location: [facility] ORS;  Service: Orthopedics;  Laterality: Right;   TUBAL LIGATION     VENTRAL HERNIA REPAIR N/A 04/21/2017   Procedure: HERNIA REPAIR VENTRAL ADULT;  Surgeon: Claudene Larinda Bolder, MD;  Location: [facility] ORS;  Service: General;  Laterality: N/A;    Medications Prior to Admission  Medication Sig Dispense Refill Last Dose/Taking   albuterol  (PROVENTIL  HFA;VENTOLIN  HFA) 108 (90 Base) MCG/ACT inhaler Inhale 2 puffs into the lungs every 6 (six) hours as needed for wheezing or shortness of breath.   Unknown   allopurinol  (ZYLOPRIM ) 300 MG tablet Take 300 mg by mouth daily.   Past Week   cyanocobalamin  (VITAMIN B12) 1000 MCG tablet Take 1,000 mcg by mouth daily.   Past Week   empagliflozin  (JARDIANCE ) 10 MG TABS tablet Take 1 tablet by mouth daily.   Past Week   fluticasone  (FLONASE ) 50 MCG/ACT nasal spray Place 2 sprays into the nose daily.   Past Week   iron  polysaccharides (NIFEREX) 150 MG capsule Take 1 capsule (150 mg total) by  mouth daily. 30 capsule 2 Past Week   latanoprost  (XALATAN ) 0.005 % ophthalmic solution Place 1 drop into both eyes at bedtime.   Past Week   levothyroxine  (SYNTHROID ) 50 MCG tablet Take 50 mcg by mouth daily before breakfast.   Past Week   magnesium  oxide (MAG-OX) 400 MG tablet Take 400 mg by mouth. Take 1 tablet (400 mg total) by mouth once daily   Past Week   metFORMIN  (GLUCOPHAGE ) 1000 MG tablet Take 1,000 mg by mouth 2 (two) times daily with a meal.   Past Week   nadolol (CORGARD) 20 MG tablet Take 20 mg by mouth daily.   Past Week   pantoprazole  (PROTONIX ) 40 MG tablet Take 40 mg by mouth daily.   Past Week   rosuvastatin  (CRESTOR ) 5 MG tablet Take 5 mg by mouth daily.   Past Week   spironolactone   (ALDACTONE ) 25 MG tablet Take 0.5 tablets (12.5 mg total) by mouth daily.   Past Week   torsemide  (DEMADEX ) 20 MG tablet Take 1 tablet (20 mg total) by mouth daily as needed (for weight gain or swelling).   Unknown   [Paused] aspirin  EC 81 MG tablet Take 81 mg by mouth daily. Swallow whole.      Blood Glucose Monitoring Suppl (ACCU-CHEK GUIDE ME) w/Device KIT       [Paused] carvedilol  (COREG ) 3.125 MG tablet Take 3.125 mg by mouth 2 (two) times daily with a meal.      phytonadione  (VITAMIN K) 5 MG tablet Take 0.5 tablets (2.5 mg total) by mouth daily for 5 days (Patient not taking: Reported on 10/27/2024) 3 tablet 0 Not Taking   Social History   Socioeconomic History   Marital status: Widowed    Spouse name: Not on file   Number of children: Not on file   Years of education: Not on file   Highest education level: Not on file  Occupational History   Occupation: certified nursing assistant  Tobacco Use   Smoking status: Former    Current packs/day: 0.00    Types: Cigarettes    Quit date: 03/30/1999    Years since quitting: 25.6   Smokeless tobacco: Never  Vaping Use   Vaping status: Never Used  Substance and Sexual Activity   Alcohol use: No   Drug use: No   Sexual activity: Not Currently  Other Topics Concern   Not on file  Social History Narrative   Not on file   Social Drivers of Health   Financial Resource Strain: Medium Risk (05/20/2024)   Received from [facility]   Overall Financial Resource Strain (CARDIA)    Difficulty of Paying Living Expenses: Somewhat hard  Food Insecurity: No Food Insecurity (10/29/2024)   Hunger Vital Sign    Worried About Running Out of Food in the Last Year: Never true    Ran Out of Food in the Last Year: Never true  Transportation Needs: No Transportation Needs (10/29/2024)   PRAPARE - Administrator, Civil Service (Medical): No    Lack of Transportation (Non-Medical): No  Physical Activity: Not on file  Stress:  Not on file  Social Connections: Moderately Integrated (10/29/2024)   Social Connection and Isolation Panel    Frequency of Communication with Friends and Family: Three times a week    Frequency of Social Gatherings with Friends and Family: Three times a week    Attends Religious Services: More than 4 times per year    Active Member of  Clubs or Organizations: Yes    Attends Banker Meetings: More than 4 times per year    Marital Status: Widowed  Intimate Partner Violence: Not At Risk (10/29/2024)   Humiliation, Afraid, Rape, and Kick questionnaire    Fear of Current or Ex-Partner: No    Emotionally Abused: No    Physically Abused: No    Sexually Abused: No    Family History  Problem Relation Age of Onset   Valvular heart disease Mother    Diabetes Father    Cancer Sister        lung   Cancer Sister        cervical   Breast cancer Cousin       Review of systems complete and found to be negative unless listed above      PHYSICAL EXAM  General: Well developed, well nourished, in no acute distress HEENT:  Normocephalic and atramatic Neck:  No JVD.  Lungs: Clear bilaterally to auscultation and percussion. Heart: Irregular irregular normal S1 and S2 with 2/6 sem murmurs.  Abdomen: Bowel sounds are positive, abdomen soft and non-tender ileostomy in place Msk:  Back normal, normal gait. Normal strength and tone for age. Extremities: No clubbing, cyanosis or 3+edema.   Neuro: Alert and oriented X 3. Psych:  Good affect, responds appropriately  Labs:   Lab Results  Component Value Date   WBC 6.1 11/03/2024   HGB 8.5 (L) 11/03/2024   HCT 26.7 (L) 11/03/2024   MCV 96.8 11/03/2024   PLT 52 (L) 11/03/2024    Recent Labs  Lab 10/28/24 0746 10/28/24 1602 11/03/24 0555  NA  --    < > 128*  K  --    < > 4.7  CL  --    < > 95*  CO2  --    < > 25  BUN  --    < > 23  CREATININE  --    < > 1.43*  CALCIUM   --    < > 7.4*  PROT 5.8*  --   --   BILITOT 1.5*  --    --   ALKPHOS 107  --   --   ALT 47*  --   --   AST 72*  --   --   GLUCOSE  --    < > 144*   < > = values in this interval not displayed.   Lab Results  Component Value Date   CKTOTAL 53 02/28/2024   TROPONINI <0.03 12/17/2018   No results found for: CHOL No results found for: HDL No results found for: [facility] Lab Results  Component Value Date   TRIG 31 03/04/2024   TRIG 136 03/02/2024   TRIG 563 (H) 02/29/2024   No results found for: CHOLHDL No results found for: LDLDIRECT    Radiology: DG Chest Port 1 View Result Date: 10/31/2024 EXAM: 1 VIEW(S) XRAY OF THE CHEST 10/31/2024 10:42:05 AM COMPARISON: 10/27/2024 CLINICAL HISTORY: Wheezing. FINDINGS: LINES, TUBES AND DEVICES: Right internal jugular central line remains in place, unchanged. LUNGS AND PLEURA: Minimal left basilar linear densities most compatible with atelectasis. No pleural effusion. No pneumothorax. HEART AND MEDIASTINUM: Mild cardiomegaly, stable. BONES AND SOFT TISSUES: Prior median sternotomy. No acute osseous abnormality. IMPRESSION: 1. Left base atelectasis. 2. Mild cardiomegaly, stable. Electronically signed by: Franky Crease MD 10/31/2024 02:32 PM EST RP Workstation: [id number]   CT ABDOMEN PELVIS WO CONTRAST Result Date: 10/28/2024 CLINICAL DATA:  Sepsis. EXAM: CT ABDOMEN AND PELVIS  WITHOUT CONTRAST TECHNIQUE: Multidetector CT imaging of the abdomen and pelvis was performed following the standard protocol without IV contrast. RADIATION DOSE REDUCTION: This exam was performed according to the departmental dose-optimization program which includes automated exposure control, adjustment of the mA and/or kV according to patient size and/or use of iterative reconstruction technique. COMPARISON:  08/21/2024 FINDINGS: Lower chest: Trace bilateral pleural effusions. Hepatobiliary: Gallstones. No significant gallbladder distension but limited evaluation of the gallbladder. No acute abnormality involving the liver. Small  amount of perihepatic ascites. Pancreas: Unremarkable. No pancreatic ductal dilatation or surrounding inflammatory changes. Spleen: Normal in size without focal abnormality. Adrenals/Urinary Tract: Adrenal glands within normal limits. Again noted are small hyperdense structures in the kidneys are too small to definitively characterize. No definite stones or hydronephrosis. Small amount of fluid in the urinary bladder. Stomach/Bowel: History of colectomy with a Hartmann's pouch. Normal appearance of the stomach. No acute abnormality involving the ileostomy. Limited evaluation of the small bowel structures due to the minimal oral contrast. Difficult to exclude areas of small bowel wall thickening in the left abdomen. Mild mesenteric edema. Vascular/Lymphatic: Atherosclerotic calcifications in the abdominal aorta without aneurysm. Central line in the right common femoral vein that terminates near the junction of the IVC and right common iliac vein. No significant lymph node enlargement in the abdomen or pelvis. Reproductive: Limited evaluation but no gross abnormality to the uterus or adnexal structures. Other: Again noted is free fluid in the pelvis and small amount of upper abdominal ascites. Small amount of free fluid in left lower quadrant. Diffuse subcutaneous edema. Right paracentral ventral hernia in the upper abdomen contains a small amount of fluid and similar to the previous examination. Musculoskeletal: Scoliosis in the thoracolumbar spine. No acute bone abnormality. IMPRESSION: 1. Small amount of ascites in the abdomen and pelvis and similar to the exam on 08/21/2024. 2. Trace bilateral pleural effusions. 3. Cholelithiasis.  Limited evaluation of the gallbladder. 4. Small hyperdense renal structures that are too small to definitively characterize and recommend attention on follow-up. 5. Limited evaluation of the bowel structures with an ileostomy. No evidence for a bowel obstruction. 6. Aortic  Atherosclerosis (ICD10-I70.0). Electronically Signed   By: Juliene Balder M.D.   On: 10/28/2024 15:22   DG Chest Port 1 View Result Date: 10/27/2024 CLINICAL DATA:  Central line EXAM: PORTABLE CHEST 1 VIEW COMPARISON:  Chest x-ray 10/27/2024 FINDINGS: There is a new right-sided central venous catheter with distal tip in the proximal right atrium. The heart is enlarged. Patient is status post cardiac surgery. The lungs are clear. There is no pleural effusion or pneumothorax. No acute fractures are seen. IMPRESSION: New right-sided central venous catheter with distal tip in the proximal right atrium. No pneumothorax. Electronically Signed   By: Greig Pique M.D.   On: 10/27/2024 16:54   DG Chest Port 1 View Result Date: 10/27/2024 CLINICAL DATA:  Sepsis.  GI bleeding.  Hypotension. EXAM: PORTABLE CHEST 1 VIEW COMPARISON:  08/15/2024 FINDINGS: Stable mild cardiomegaly. Prior median sternotomy. Both lungs are clear. IMPRESSION: Mild cardiomegaly. No active lung disease. Electronically Signed   By: Norleen DELENA Kil M.D.   On: 10/27/2024 11:49    EKG: Atrial fibrillation low voltage nonspecific ST-T wave changes 75  ASSESSMENT AND PLAN:  Hemorrhagic shock Acute/chronic anemia HFrEF 25 to 30% Valvular regurgitation severe tricuspid Multivessel coronary disease including coronary bypass surgery Liver cirrhosis with portal hypertension Failed Watchman procedure 02/2024 Acute on chronic renal insufficiency Coagulopathy secondary to hepatic disease Status post Mitralclip 03/2024 Cor  triatriatum with aortic valve repair  Plan Agree with critical care level management and care HFrEF ejection fraction around 25 to 30% with severe MR continue medical therapy as blood pressure allows including diuretic Right ventricular systolic dysfunction with severe tricuspid regurgitation probably will not benefit from valvular intervention mitral tricuspid valve clips Unlikely to benefit from surgical intervention because  of comorbid state Atrial fibrillation poor anticoagulation candidate failed Watchman deployment Multivessel coronary disease including coronary bypass surgery no recent anginal symptoms Aortic valve disease status post aortic repair pulmonary hypertension with severe valvular regurgitation with tricuspid valve Continue diabetes management and control History of hypertension but currently hypotensive requiring low-dose pressor therapy Liver disease with cirrhosis grade 2 esophageal varices unable to tolerate beta-blockade on nadolol therapy because of hypotension as well as bleeding History of C. difficile colitis April 2025 Status post exploratory lap with total colectomy and end ileostomy but with recent bleeding while not on anticoagulation maintain hemoglobin above 8 No clear cardiology intervention indicated  Signed: Cara JONETTA Lovelace MD, 11/03/2024, 2:03 PM

## 2024-11-03 NOTE — Progress Notes (Signed)
 Palliative Care Progress Note, Assessment & Plan   Patient Name: Tammy Arias       Date: 11/03/2024 DOB: [birth year]  Age: 68 y.o. MRN#: [id number] Attending Physician: Parris Manna, MD Primary Care Physician: Valora Lynwood FALCON, MD Admit Date: 10/27/2024  Subjective: Reports feeling better today.  Complains of slight headache.  She is eating most of each meal.  She complains of persisting bilateral lower extremity edema.  She endorses wanting to leave ICU and be placed on medical floor so she can go home.    HPI: Per previous HPI- 68 y.o.female with medical problems of heart failure with reduced ejection fraction 25 to 30%, severe mitral regurgitation, moderate right ventricular systolic dysfunction, severe tricuspid regurgitation, atrial fibrillation, failed Watchman device, chronic anticoagulation with apixaban , GI bleed, coronary disease status post CABG in 2005, aortic stenosis repair, pulmonary hypertension, type 2 diabetes, hypertension, liver cirrhosis with grade 2 esophageal varices, C. 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    Palliative medicine consulted for assistance with goals of care conversations.   Summary of counseling/coordination of care: Extensive chart review completed prior to meeting patient including labs, vital signs, imaging, progress notes, orders, and available advanced directive documents from current and previous encounters.   After reviewing the patient's chart and assessing the patient at bedside, I spoke with patient in regards to symptom management and goals of care.      Latest Ref Rng & Units 11/03/2024   10:49 AM 11/03/2024    5:55 AM 11/02/2024    5:01 PM  CBC  WBC 4.0 - 10.5 K/uL  6.1    Hemoglobin 12.0 - 15.0  g/dL 8.5  7.8  8.2   Hematocrit 36.0 - 46.0 % 26.7  23.9  25.4   Platelets 150 - 400 K/uL  52        Latest Ref Rng & Units 11/03/2024    5:55 AM 11/02/2024    4:38 AM 11/01/2024    5:34 AM  CMP  Glucose 70 - 99 mg/dL 855  846  865   BUN 8 - 23 mg/dL 23  19  16    Creatinine 0.44 - 1.00 mg/dL 8.56  8.47  8.43   Sodium 135 - 145 mmol/L 128  128  126   Potassium 3.5 - 5.1 mmol/L 4.7  4.9  5.0   Chloride 98 - 111 mmol/L 95  96  95   CO2 22 - 32 mmol/L 25  25  27    Calcium  8.9 - 10.3 mg/dL 7.4  7.3  7.2      Elderly female sitting upright in bed with RN at bedside.  No family or visitors present during visit.  She is alert and oriented to self, time, location and situation.  She is able to participate in conversation making her wishes known.  Respirations are even and unlabored.  She is in no distress.  Patient remains on low-dose pressor at this time.  She expresses wanting to move to regular medical floor so she will be closer to going home.  She understands that she is still retaining fluid which requires Lasix  that lowers blood pressure thus requiring pressor  support.  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  Patient wants home health if rehabilitation is recommended at discharge.  Therapeutic silence and active listening provided for patient to share her thoughts and emotions regarding current medical situation.  Emotional support provided.  Physical Exam Vitals reviewed.  Constitutional:      General: She is not in acute distress.    Appearance: She is not ill-appearing.  HENT:     Head: Normocephalic and atraumatic.     Mouth/Throat:     Mouth: Mucous membranes are moist.  Pulmonary:     Effort: Pulmonary effort is normal. No respiratory distress.  Musculoskeletal:     Right lower leg: Edema present.     Left lower leg: Edema present.  Skin:    General: Skin is warm and dry.  Neurological:     Mental Status: She  is alert and oriented to person, place, and time.  Psychiatric:        Mood and Affect: Mood normal.        Behavior: Behavior normal.        Thought Content: Thought content normal.        Judgment: Judgment normal.       Recommendations/Plan: DNR/DNI Continue current supportive interventions Patient prefers to d/c home with [facility]- does not want SNF PMT will continue to follow            Total Time 50 minutes   Discussed current plan of care with primary RN.  Time spent includes: Detailed review of medical records (labs, imaging, vital signs), medically appropriate exam (mental status, respiratory, cardiac, skin), discussed with treatment team, counseling and educating patient, family and staff, documenting clinical information, medication management and coordination of care.     Devere Sacks, ELNITA- [facility] Palliative Medicine Team  11/03/2024 8:16 AM  Office [phone]  Pager [phone]

## 2024-11-03 NOTE — Progress Notes (Signed)
 Received consult on behalf of patient to establish peripheral access. Pt currently has working central access and remains on levophed . Messaged primary RN to clarify reasoning behind removing central line. PA-C Karlie, Dr. Marge, & Dr. Aleskerov added to chat by primary RN. All providers stated that they wanted the central access removed at this time because it had been in place for several days. No concern for active infection or malposition of line at this time. Stated they want to remove line because of a potential future infection. Plan for pt to remain on levo until able to be weaned. Per MAR, levophed  dose was decreased overnight but then increased. Unknown when pt will be able to be fully weaned off levophed . Recommended to primary team to maintain central access due to ongoing vasopressor requirement. Primary team stated they wanted peripheral access established and central access removed. Placed 22g in L forearm. Educated primary team that levophed  can only be infused for short time peripherally before central access will have to be re-established.

## 2024-11-03 NOTE — Plan of Care (Signed)
  Problem: Education: Goal: Knowledge of General Education information will improve Description: Including pain rating scale, medication(s)/side effects and non-pharmacologic comfort measures Outcome: Progressing   Problem: Health Behavior/Discharge Planning: Goal: Ability to manage health-related needs will improve Outcome: Progressing   Problem: Clinical Measurements: Goal: Will remain free from infection Outcome: Progressing Goal: Respiratory complications will improve Outcome: Progressing   Problem: Nutrition: Goal: Adequate nutrition will be maintained Outcome: Progressing   Problem: Coping: Goal: Level of anxiety will decrease Outcome: Progressing   Problem: Elimination: Goal: Will not experience complications related to bowel motility Outcome: Progressing Goal: Will not experience complications related to urinary retention Outcome: Progressing   Problem: Pain Managment: Goal: General experience of comfort will improve and/or be controlled Outcome: Progressing   Problem: Safety: Goal: Ability to remain free from injury will improve Outcome: Progressing   Problem: Coping: Goal: Ability to adjust to condition or change in health will improve Outcome: Progressing   Problem: Clinical Measurements: Goal: Ability to maintain clinical measurements within normal limits will improve Outcome: Not Progressing Goal: Cardiovascular complication will be avoided Outcome: Not Progressing   Problem: Activity: Goal: Risk for activity intolerance will decrease Outcome: Not Progressing   Problem: Fluid Volume: Goal: Ability to maintain a balanced intake and output will improve Outcome: Not Progressing

## 2024-11-03 NOTE — Progress Notes (Addendum)
 Patient stated she was told the HD team would come to remove her HD perm cath.  Reached out to nephrology with patient statements.

## 2024-11-03 NOTE — Progress Notes (Signed)
 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 She reports to ED for generalized malaise and is being admitted for Hemorrhagic shock (HCC) [R57.8] Shock (HCC) [R57.9] Acute GI bleeding [K92.2] Acute renal failure, unspecified acute renal failure type [N17.9]  Patient is known to our practice from previous admissions and has seen Dr Dennise in he office once.   Update: Patient sitting up in bed Alert and oriented Remains on room air  Urine output 2.8L  Objective:  Vital signs in last 24 hours:  Temp:  [98 F (36.7 C)-98.8 F (37.1 C)] 98.8 F (37.1 C) (12/07 0800) Pulse Rate:  [66-115] 99 (12/07 0830) Resp:  [11-24] 18 (12/07 0830) BP: (78-128)/(43-90) 111/56 (12/07 0830) SpO2:  [95 %-100 %] 100 % (12/07 0830)  Weight change:  Filed Weights   10/31/24 1342 10/31/24 1638 11/01/24 0500  Weight: 66.9 kg 66.4 kg 73.3 kg    Intake/Output: I/O last 3 completed shifts: In: 1190.5 [P.O.:720; I.V.:197.3; IV Piggyback:273.2] Out: 5935 [Urine:4200; Stool:1735]   Intake/Output this shift:  No intake/output data recorded.  Physical Exam: General: NAD  Head: Normocephalic, atraumatic. Moist oral mucosa  Eyes: Anicteric  Lungs:  Clear to auscultation, normal effort  Heart: Regular rate and rhythm  Abdomen:  Soft, nontender. Ileostomy   Extremities:  Trace-1+ peripheral edema.  Neurologic: Awake, alert,  conversant  Skin: Warm,dry, no rash  Access: Rt groin Trialysis     Basic Metabolic Panel: Recent Labs  Lab 10/30/24 0355 10/30/24 2232 10/31/24 0419 11/01/24 0534 11/02/24 0438 11/03/24 0555  NA 133*  --  126* 126* 128* 128*  K 3.5  --  5.0 5.0 4.9 4.7  CL 98  --  93* 95* 96* 95*  CO2 26  --  25 27 25 25   GLUCOSE 181*  --  184* 134* 153* 144*  BUN 13  --  19 16 19 23   CREATININE 1.57*  --  2.07* 1.56* 1.52* 1.43*  CALCIUM  8.8*  --  7.5* 7.2* 7.3* 7.4*  MG 1.7  --  1.7 1.7 1.9 1.9  PHOS 1.6*  1.5* <1.0* 3.4 1.7* 1.9* 2.0*    Liver Function Tests: Recent Labs  Lab 10/28/24 0746 10/28/24 1602 10/29/24 1556 10/30/24 0355 10/31/24 0419 11/01/24 0534 11/02/24 0438  AST 72*  --   --   --   --   --   --   ALT 47*  --   --   --   --   --   --   ALKPHOS 107  --   --   --   --   --   --   BILITOT 1.5*  --   --   --   --   --   --   PROT 5.8*  --   --   --   --   --   --   ALBUMIN  3.6   < > 3.4* 3.6 3.3* 3.3* 3.4*   < > = values in this interval not displayed.   Recent  Labs  Lab 10/28/24 0746  LIPASE 230*   No results for input(s): AMMONIA in the last 168 hours.  CBC: Recent Labs  Lab 10/29/24 0619 10/29/24 1133 10/30/24 0355 10/30/24 1100 10/31/24 0419 10/31/24 0819 11/01/24 0534 11/01/24 1118 11/01/24 1658 11/02/24 0438 11/02/24 1701 11/03/24 0555  WBC 5.4  --  3.6*  --  4.9  --  4.7  --   --  6.0  --  6.1  NEUTROABS 4.5  --  2.3  --   --   --   --   --   --   --   --   --   HGB 8.4*   < > 7.1*   < > 7.8*   < > 7.4* 7.7* 7.6* 8.1* 8.2* 7.8*  HCT 24.1*   < > 20.9*   < > 23.0*   < > 22.5* 23.0* 23.3* 24.7* 25.4* 23.9*  MCV 91.6  --  95.0  --  91.3  --  93.8  --   --  95.0  --  96.8  PLT 66*  --  40*  --  41*  --  39*  --   --  49*  --  52*   < > = values in this interval not displayed.    Cardiac Enzymes: No results for input(s): CKTOTAL, CKMB, CKMBINDEX, TROPONINI in the last 168 hours.  BNP: Invalid input(s): POCBNP  CBG: Recent  Labs  Lab 11/02/24 0756 11/02/24 1200 11/02/24 1609 11/02/24 2229 11/03/24 0800  GLUCAP 119* 210* 231* 255* 120*    Microbiology: Results for orders placed or performed during the hospital encounter of 10/27/24  Resp panel by RT-PCR (RSV, Flu A&B, Covid) Anterior Nasal Swab     Status: None   Collection Time: 10/27/24 11:01 AM   Specimen: Anterior Nasal Swab  Result Value Ref Range Status   SARS Coronavirus 2 by RT PCR NEGATIVE NEGATIVE Final    Comment: (NOTE) SARS-CoV-2 target nucleic acids are NOT DETECTED.  The SARS-CoV-2 RNA is generally detectable in upper respiratory specimens during the acute phase of infection. The lowest concentration of SARS-CoV-2 viral copies this assay can detect is 138 copies/mL. A negative result does not preclude SARS-Cov-2 infection and should not be used as the sole basis for treatment or other patient management decisions. A negative result may occur with  improper specimen collection/handling, submission of specimen other than nasopharyngeal swab, presence of viral mutation(s) within the areas targeted by this assay, and inadequate number of viral copies(<138 copies/mL). A negative result must be combined with clinical observations, patient history, and epidemiological information. The expected result is Negative.  Fact Sheet for Patients:  bloggercourse.com  Fact Sheet for Healthcare Providers:  seriousbroker.it  This test is no t yet approved or cleared by the [place]  FDA and  has been authorized for detection and/or diagnosis of SARS-CoV-2 by FDA under an Emergency Use Authorization (EUA). This EUA will remain  in effect (meaning this test can be used) for the duration of the COVID-19 declaration under Section 564(b)(1) of the Act, 21 U.S.C.section 360bbb-3(b)(1), unless the authorization is terminated  or revoked sooner.       Influenza A by PCR NEGATIVE NEGATIVE Final    Influenza B by PCR NEGATIVE NEGATIVE Final    Comment: (NOTE) The Xpert Xpress SARS-CoV-2/FLU/RSV plus assay is intended as an aid in the diagnosis of influenza from Nasopharyngeal swab specimens and should not be used as a sole basis for treatment. Nasal washings and  aspirates are unacceptable for Xpert Xpress SARS-CoV-2/FLU/RSV testing.  Fact Sheet for Patients: bloggercourse.com  Fact Sheet for Healthcare Providers: seriousbroker.it  This test is not yet approved or cleared by the [place]  FDA and has been authorized for detection and/or diagnosis of SARS-CoV-2 by FDA under an Emergency Use Authorization (EUA). This EUA will remain in effect (meaning this test can be used) for the duration of the COVID-19 declaration under Section 564(b)(1) of the Act, 21 U.S.C. section 360bbb-3(b)(1), unless the authorization is terminated or revoked.     Resp Syncytial Virus by PCR NEGATIVE NEGATIVE Final    Comment: (NOTE) Fact Sheet for Patients: bloggercourse.com  Fact Sheet for Healthcare Providers: seriousbroker.it  This test is not yet approved or cleared by the [place]  FDA and has been authorized for detection and/or diagnosis of SARS-CoV-2 by FDA under an Emergency Use Authorization (EUA). This EUA will remain in effect (meaning this test can be used) for the duration of the COVID-19 declaration under Section 564(b)(1) of the Act, 21 U.S.C. section 360bbb-3(b)(1), unless the authorization is terminated or revoked.  Performed at [facility], [address] [place], [place] [address]   Blood Culture (routine x 2)     Status: None   Collection Time: 10/27/24 12:18 PM   Specimen: Right Antecubital; Blood  Result Value Ref Range Status   Specimen Description RIGHT ANTECUBITAL  Final   Special Requests   Final    BOTTLES DRAWN AEROBIC AND ANAEROBIC Blood  Culture adequate volume   Culture   Final    NO GROWTH 5 DAYS Performed at [facility], [address] [place], [place] [address]    Report Status 11/01/2024 FINAL  Final  Blood Culture (routine x 2)     Status: None   Collection Time: 10/27/24 12:18 PM   Specimen: Left Antecubital; Blood  Result Value Ref Range Status   Specimen Description LEFT ANTECUBITAL  Final   Special Requests   Final    BOTTLES DRAWN AEROBIC AND ANAEROBIC Blood Culture results may not be optimal due to an inadequate volume of blood received in culture bottles   Culture   Final    NO GROWTH 5 DAYS Performed at [facility], [address], [place], [place] [address]    Report Status 11/01/2024 FINAL  Final  MRSA Next Gen by PCR, Nasal     Status: Abnormal   Collection Time: 10/27/24  1:38 PM   Specimen: Nasal Mucosa; Nasal Swab  Result Value Ref Range Status   MRSA by PCR Next Gen DETECTED (A) NOT DETECTED Final    Comment: RESULT CALLED TO, READ BACK BY AND VERIFIED WITH: KATHERINE CLAYTON @1520  10/27/24 MJU (NOTE) The GeneXpert MRSA Assay (FDA approved for NASAL specimens only), is one component of a comprehensive MRSA colonization surveillance program. It is not intended to diagnose MRSA infection nor to guide or monitor treatment for MRSA infections. Test performance is not FDA approved in patients less than 48 years old. Performed at [facility], [address] [place], [place] [address]     Coagulation Studies: No results for input(s): LABPROT, INR in the last 72 hours.   Urinalysis: No results for input(s): COLORURINE, LABSPEC, PHURINE, GLUCOSEU, HGBUR, BILIRUBINUR, KETONESUR, PROTEINUR, UROBILINOGEN, NITRITE, LEUKOCYTESUR in the last 72 hours.  Invalid input(s): APPERANCEUR     Imaging: No results found.     Medications:    sodium chloride      albumin  human     norepinephrine  (LEVOPHED )  Adult infusion     sodium  PHOSPHATE IVPB (in mmol) 20 mmol (11/03/24 0838)    alteplase   2 mg Intracatheter Once   Chlorhexidine  Gluconate Cloth  6 each Topical Nightly   feeding supplement (GLUCERNA SHAKE)  237 mL Oral TID BM   furosemide   40 mg Intravenous Daily   insulin  aspart  0-15 Units Subcutaneous TID WC   insulin  aspart  0-5 Units Subcutaneous QHS   insulin  glargine-yfgn  7 Units Subcutaneous Daily   latanoprost   1 drop Both Eyes QHS   levothyroxine   50 mcg Oral QAC breakfast   midodrine   15 mg Oral TID WC   multivitamin  1 tablet Oral QHS   pantoprazole  (PROTONIX ) IV  40 mg Intravenous Q12H   phytonadione   2.5 mg Oral Daily   sodium chloride  flush  3 mL Intravenous Q12H   acetaminophen , docusate sodium , ipratropium-albuterol , ondansetron  (ZOFRAN ) IV, mouth rinse, oxyCODONE , polyethylene glycol, sodium chloride  flush, traZODone   Assessment/ Plan:  Ms. SIMA LINDENBERGER is a 68 y.o.  female presents to ED with weakness and has been admitted for Hemorrhagic shock (HCC) [R57.8] Shock (HCC) [R57.9] Acute GI bleeding [K92.2] Acute renal failure, unspecified acute renal failure type [N17.9]   Acute Kidney Injury with hyperkalemia, with baseline creatinine 0.92 on 08/22/24.  Acute kidney injury appears multifactorial at this time from hypotension and dehydration. Potassium 6.1 on admission. Corrected some with shifting measures. CRRT from 12/1 to 12/3.   Remains on 1 pressor. Creatinine slowly improving. Adequate urine output.  Continue IV furosemide  40mg  daily.Due to renal recovery, patient will no longer require dialysis. Order placed to remove HD temp cath. Will continue to monitor.   Lab Results  Component Value Date   CREATININE 1.43 (H) 11/03/2024   CREATININE 1.52 (H) 11/02/2024   CREATININE 1.56 (H) 11/01/2024    Intake/Output Summary (Last 24 hours) at 11/03/2024 1122 Last data filed at 11/03/2024 0600 Gross per 24 hour  Intake 874.69 ml  Output 3185 ml  Net -2310.31 ml    2. Acute metabolic  acidosis, S bicarb 9 on ED arrival. Resolved  3. Anemia with suspected acute blood loss Lab Results  Component Value Date   HGB 7.8 (L) 11/03/2024  Has received blood transfusions during this admission. GI has no plans to repeat EGD due to current and recent workups. Expect levels to slowly correct with renal function.   4. Hypotension, hemorrhagic shock. Remains on low dose of Levo   LOS: 7 Omare Bilotta 12/7/202511:22 AM

## 2024-11-03 NOTE — Progress Notes (Signed)
 NAME:  Tammy Arias, MRN:  [id number], DOB:  Dec 21, 1955, LOS: 7 ADMISSION DATE:  10/27/2024    CHIEF COMPLAINT:  Circulatory shock  History of Present Illness:  68 y.o.female with medical problems of heart failure with reduced ejection fraction 25 to 30%, severe mitral regurgitation, moderate right ventricular systolic dysfunction, severe tricuspid regurgitation, atrial fibrillation, failed Watchman device, chronic anticoagulation with apixaban , GI bleed, coronary disease status post CABG in 2005, aortic stenosis repair, pulmonary hypertension, type 2 diabetes, hypertension, liver cirrhosis with grade 2 esophageal varices, C. 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    She reports to ED for generalized malaise and is being admitted for hypotension +GIB from ileostomy   She states she was in her normal state of health on Thanksgiving and was able to eat well. Started feeling bad on Friday. Was unable to eat on Saturday. States she has had a lot of bloody drainage from ileostomy.  had progressive weakness since yesterday.      Significant Hospital Events: Including procedures, antibiotic start and stop dates in addition to other pertinent events   11/30 admitted for hemorraghic shock, given 3-4 Liters of fluid, baseline SBP 90's 10/29/24- patient remains on vasopressors.  Platelet with significant drop overnight but appears to be chronic when trended over past 5 years.  10/30/24- patient remains critically ill, her renal function is improving, remains with electrolyte derrangements including low phos, low Na, low Ca, elevated glucose.  CBC with borderline low Hb, thrombocytopenia, eosinophilia. Blood cultures negative to date.  RRT is ongoing. S/p 1 unit prbc transfusion today.  10/31/24 - patient with severe electrolyte derrangements, hyponatremia and thrombocytopenia. Clinically improved but still critically ill on vasopressor  support 11/01/24- patient restarted on levophed  overnight. No bleeding per ostomy. RIJ TLC +.   Plan to transition to TRH service. 11/02/24- patient required levophed  overnight and remains in ICU.  She is clinically not worse and h/h is trending up. Blood cultures negative. WBC count normal.  11/03/24- patient with component of cardiogenic shock.  She has extensive cardiac history and we have consulted cardiology for additional input. Her main reason for being in ICU is hemoragic shock which is resolved but now cardiac etiology is main culprit of illness.    Antibiotics Given (last 72 hours)     None        Objective   Blood pressure (!) 111/56, pulse 99, temperature 98.8 F (37.1 C), temperature source Oral, resp. rate 18, height 5' 1 (1.549 m), weight 73.3 kg, SpO2 100%.        Intake/Output Summary (Last 24 hours) at 11/03/2024 1037 Last data filed at 11/03/2024 0600 Gross per 24 hour  Intake 916.71 ml  Output 3185 ml  Net -2268.29 ml   Filed Weights   10/31/24 1342 10/31/24 1638 11/01/24 0500  Weight: 66.9 kg 66.4 kg 73.3 kg   GENERAL:NAD age appropriate EYES: Pupils equal, round, reactive to light.  No scleral icterus.  MOUTH: Moist mucosal membrane NECK: Supple.  PULMONARY: Lungs clear to auscultation, +rhonchi, +wheezing CARDIOVASCULAR: S1 and S2.  Regular rate and rhythm GASTROINTESTINAL: Soft, nontender, -distended. Positive bowel sounds. Ileostomy in place MUSCULOSKELETAL: No swelling, clubbing, or edema.  NEUROLOGIC: alert and awake SKIN:normal, warm to touch, Capillary refill delayed  Pulses present bilaterally   Labs/imaging that I havepersonally reviewed  (right click and Reselect all SmartList Selections daily)    ASSESSMENT AND PLAN SYNOPSIS 68 yo white female with multiple medical  issues admitted for severe and acute hypovolumic shock GIB from Ileostomy  Hemorrhagic  SHOCK- present on admission-NOW RESOLVED  SOURCE-GIB -use vasopressors to keep  MAP>65 as needed -follow ABG and LA - cultures- negative thus far   2. ACUTE blood loss ANEMIA-RESOLVED -hx of total colectomy and ileostomy with bleeding from ostomy site at home -GI on case -appreciate input  -TRANSFUSE AS NEEDED IF HGB<7 DVT PRX with TED/SCD's ONLY    3. ACUTE ON Chronic CARDIAC dysfunction   - advanced systolic CHF EF 30% and chronic diastolic CHF   -pulmonary hypertension   - Valvular AF -CABG -DNR STATUS  -not good candidate for advanced therapies due to inability to give anticoagulation ICU telemetry monitoring   4. ACUTE KDIGO 4 on chronic KIDNEY disease  -likely due to ischemia post bleeding -continue Foley Catheter-assess need -Avoid nephrotoxic agents -Follow urine output, BMP -Ensure adequate renal perfusion, optimize oxygenation -Renal dose medications  5. Liver cirrhosis with portal hypertensive disease             - esophageal varices             - recurrent GI bleeding             - coagulopathy            - poor prognosis   Intake/Output Summary (Last 24 hours) at 11/03/2024 1037 Last data filed at 11/03/2024 0600 Gross per 24 hour  Intake 916.71 ml  Output 3185 ml  Net -2268.29 ml       Latest Ref Rng & Units 11/03/2024    5:55 AM 11/02/2024    4:38 AM 11/01/2024    5:34 AM  BMP  Glucose 70 - 99 mg/dL 855  846  865   BUN 8 - 23 mg/dL 23  19  16    Creatinine 0.44 - 1.00 mg/dL 8.56  8.47  8.43   Sodium 135 - 145 mmol/L 128  128  126   Potassium 3.5 - 5.1 mmol/L 4.7  4.9  5.0   Chloride 98 - 111 mmol/L 95  96  95   CO2 22 - 32 mmol/L 25  25  27    Calcium  8.9 - 10.3 mg/dL 7.4  7.3  7.2     ENDO - ICU hypoglycemic\Hyperglycemia protocol -check FSBS per protocol   GI GI PROPHYLAXIS as indicated  NUTRITIONAL STATUS DIET-->NPO Constipation protocol as indicated Gi consulted  ELECTROLYTES -follow labs as needed -replace as needed -pharmacy consultation and following     Best practice (right click and Reselect all  SmartList Selections daily)  Diet:  NPO DVT prophylaxis: Contraindicated GI prophylaxis: PPI Mobility:  bed rest  Code Status:  FULL CODE Disposition: ICU  Labs   CBC: Recent Labs  Lab 10/29/24 0619 10/29/24 1133 10/30/24 0355 10/30/24 1100 10/31/24 0419 10/31/24 0819 11/01/24 0534 11/01/24 1118 11/01/24 1658 11/02/24 0438 11/02/24 1701 11/03/24 0555  WBC 5.4  --  3.6*  --  4.9  --  4.7  --   --  6.0  --  6.1  NEUTROABS 4.5  --  2.3  --   --   --   --   --   --   --   --   --   HGB 8.4*   < > 7.1*   < > 7.8*   < > 7.4* 7.7* 7.6* 8.1* 8.2* 7.8*  HCT 24.1*   < > 20.9*   < > 23.0*   < > 22.5* 23.0*  23.3* 24.7* 25.4* 23.9*  MCV 91.6  --  95.0  --  91.3  --  93.8  --   --  95.0  --  96.8  PLT 66*  --  40*  --  41*  --  39*  --   --  49*  --  52*   < > = values in this interval not displayed.    Basic Metabolic Panel: Recent Labs  Lab 10/30/24 0355 10/30/24 2232 10/31/24 0419 11/01/24 0534 11/02/24 0438 11/03/24 0555  NA 133*  --  126* 126* 128* 128*  K 3.5  --  5.0 5.0 4.9 4.7  CL 98  --  93* 95* 96* 95*  CO2 26  --  25 27 25 25   GLUCOSE 181*  --  184* 134* 153* 144*  BUN 13  --  19 16 19 23   CREATININE 1.57*  --  2.07* 1.56* 1.52* 1.43*  CALCIUM  8.8*  --  7.5* 7.2* 7.3* 7.4*  MG 1.7  --  1.7 1.7 1.9 1.9  PHOS 1.6*  1.5* <1.0* 3.4 1.7* 1.9* 2.0*   GFR: Estimated Creatinine Clearance: 34 mL/min (A) (by C-G formula based on SCr of 1.43 mg/dL (H)). Recent Labs  Lab 10/28/24 1135 10/28/24 1325 10/28/24 1602 10/28/24 1930 10/29/24 0619 10/31/24 0419 11/01/24 0534 11/02/24 0438 11/03/24 0555  PROCALCITON  --   --  0.34  --   --   --   --   --   --   WBC  --   --   --   --    < > 4.9 4.7 6.0 6.1  LATICACIDVEN 3.8* 4.1* 4.6* 4.4*  --   --   --   --   --    < > = values in this interval not displayed.    Liver Function Tests: Recent Labs  Lab 10/28/24 0746 10/28/24 1602 10/29/24 1556 10/30/24 0355 10/31/24 0419 11/01/24 0534 11/02/24 0438  AST 72*   --   --   --   --   --   --   ALT 47*  --   --   --   --   --   --   ALKPHOS 107  --   --   --   --   --   --   BILITOT 1.5*  --   --   --   --   --   --   PROT 5.8*  --   --   --   --   --   --   ALBUMIN  3.6   < > 3.4* 3.6 3.3* 3.3* 3.4*   < > = values in this interval not displayed.   Recent Labs  Lab 10/28/24 0746  LIPASE 230*   No results for input(s): AMMONIA in the last 168 hours.  ABG    Component Value Date/Time   PHART 7.47 (H) 03/01/2024 1055   PCO2ART 40 03/01/2024 1055   PO2ART 112 (H) 03/01/2024 1055   HCO3 29.1 (H) 03/01/2024 1055   ACIDBASEDEF 2.7 (H) 02/29/2024 0952   O2SAT 67.3 08/22/2024 0500     Coagulation Profile: Recent Labs  Lab 10/28/24 1039  INR 1.4*    Cardiac Enzymes: No results for input(s): CKTOTAL, CKMB, CKMBINDEX, TROPONINI in the last 168 hours.  HbA1C: Hgb A1c MFr Bld  Date/Time Value Ref Range Status  10/28/2024 06:02 PM 5.5 4.8 - 5.6 % Final    Comment:    (NOTE)  Prediabetes: 5.7 - 6.4         Diabetes: >6.4         Glycemic control for adults with diabetes: <7.0   02/28/2024 06:08 AM 5.9 (H) 4.8 - 5.6 % Final    Comment:    (NOTE) Pre diabetes:          5.7%-6.4%  Diabetes:              >6.4%  Glycemic control for   <7.0% adults with diabetes     CBG: Recent Labs  Lab 11/02/24 0756 11/02/24 1200 11/02/24 1609 11/02/24 2229 11/03/24 0800  GLUCAP 119* 210* 231* 255* 120*    Allergies Allergies  Allergen Reactions   Vioxx [Rofecoxib] Swelling       Critical care provider statement:   Total critical care time: 33 minutes   Performed by: Parris MD   Critical care time was exclusive of separately billable procedures and treating other patients.   Critical care was necessary to treat or prevent imminent or life-threatening deterioration.   Critical care was time spent personally by me on the following activities: development of treatment plan with patient and/or surrogate as well  as nursing, discussions with consultants, evaluation of patient's response to treatment, examination of patient, obtaining history from patient or surrogate, ordering and performing treatments and interventions, ordering and review of laboratory studies, ordering and review of radiographic studies, pulse oximetry and re-evaluation of patient's condition.    Damani Rando, M.D.  Pulmonary & Critical Care Medicine

## 2024-11-03 NOTE — Plan of Care (Signed)
 Continuing with plan of care.

## 2024-11-03 NOTE — Progress Notes (Signed)
 Per provider do not remove central line at this time.

## 2024-11-03 NOTE — Consult Note (Signed)
 PHARMACY CONSULT NOTE  Pharmacy Consult for Electrolyte Monitoring and Replacement   Recent Labs: Potassium (mmol/L)  Date Value  11/03/2024 4.7   Magnesium  (mg/dL)  Date Value  87/92/7974 1.9   Calcium  (mg/dL)  Date Value  87/92/7974 7.4 (L)   Albumin  (g/dL)  Date Value  87/93/7974 3.4 (L)  05/18/2020 4.7   Phosphorus (mg/dL)  Date Value  87/92/7974 2.0 (L)   Sodium (mmol/L)  Date Value  11/03/2024 128 (L)   Assessment: 68 y/o female with h/o ischemic cardiomyopathy, gastric AVM, GI angiodysplasia, IBS-D, IDA, s/p watchman device, cholelithiasis, TIA, hypothyroidism, pulmonary hypertension, GERD, barrett's esophagus, HLD, HTN, OSA, CAD s/p CABG x 3, PAF, CHF, cirrhosis, portal hypertension, gout, CKD III, B12 deficiency, ventral hernia s/p mesh repair 2018, colonic angioectasias, diverticulosis, DDD, hiatal hernia, esophageal varices. Pharmacy is asked to follow and replace electrolytes while in CCU  Goal of Therapy:  Electrolytes WNL  Plan:  --Stable hyponatremia. Defer management to PCCM team  --Phos 2.0, Will order Sodium Phos 20mmol xIV x 1 dose --No additional replacement needed at this time.  --Follow-up electrolytes with AM labs tomorrow  Estill CHRISTELLA Lutes, PharmD, BCPS Clinical Pharmacist 11/03/2024 6:58 AM

## 2024-11-04 ENCOUNTER — Telehealth: Payer: Self-pay | Admitting: Oncology

## 2024-11-04 LAB — BASIC METABOLIC PANEL WITH GFR
Anion gap: 9 (ref 5–15)
BUN: 24 mg/dL — ABNORMAL HIGH (ref 8–23)
CO2: 26 mmol/L (ref 22–32)
Calcium: 8.3 mg/dL — ABNORMAL LOW (ref 8.9–10.3)
Chloride: 97 mmol/L — ABNORMAL LOW (ref 98–111)
Creatinine, Ser: 1.25 mg/dL — ABNORMAL HIGH (ref 0.44–1.00)
GFR, Estimated: 46 mL/min — ABNORMAL LOW (ref >60)
Glucose, Bld: 141 mg/dL — ABNORMAL HIGH (ref 70–99)
Potassium: 4 mmol/L (ref 3.5–5.1)
Sodium: 132 mmol/L — ABNORMAL LOW (ref 135–145)

## 2024-11-04 LAB — CBC
HCT: 24 % — ABNORMAL LOW (ref 36.0–46.0)
Hemoglobin: 7.8 g/dL — ABNORMAL LOW (ref 12.0–15.0)
MCH: 31.7 pg (ref 26.0–34.0)
MCHC: 32.5 g/dL (ref 30.0–36.0)
MCV: 97.6 fL (ref 80.0–100.0)
Platelets: 63 K/uL — ABNORMAL LOW (ref 150–400)
RBC: 2.46 MIL/uL — ABNORMAL LOW (ref 3.87–5.11)
RDW: 25.1 % — ABNORMAL HIGH (ref 11.5–15.5)
WBC: 6.1 K/uL (ref 4.0–10.5)
nRBC: 0 % (ref 0.0–0.2)

## 2024-11-04 LAB — GLUCOSE, CAPILLARY
Glucose-Capillary: 145 mg/dL — ABNORMAL HIGH (ref 70–99)
Glucose-Capillary: 219 mg/dL — ABNORMAL HIGH (ref 70–99)
Glucose-Capillary: 296 mg/dL — ABNORMAL HIGH (ref 70–99)
Glucose-Capillary: 302 mg/dL — ABNORMAL HIGH (ref 70–99)
Glucose-Capillary: 316 mg/dL — ABNORMAL HIGH (ref 70–99)

## 2024-11-04 LAB — PHOSPHORUS: Phosphorus: 2.8 mg/dL (ref 2.5–4.6)

## 2024-11-04 LAB — HEMOGLOBIN AND HEMATOCRIT, BLOOD
HCT: 28.6 % — ABNORMAL LOW (ref 36.0–46.0)
Hemoglobin: 9.4 g/dL — ABNORMAL LOW (ref 12.0–15.0)

## 2024-11-04 LAB — PREPARE RBC (CROSSMATCH)

## 2024-11-04 LAB — MAGNESIUM: Magnesium: 1.5 mg/dL — ABNORMAL LOW (ref 1.7–2.4)

## 2024-11-04 LAB — CORTISOL-AM, BLOOD: Cortisol - AM: 93.2 ug/dL — ABNORMAL HIGH (ref 6.7–22.6)

## 2024-11-04 MED ORDER — SODIUM CHLORIDE 0.9% IV SOLUTION: Freq: Once | INTRAVENOUS | Status: AC

## 2024-11-04 MED ORDER — HYDROCORTISONE SOD SUC (PF) 100 MG IJ SOLR
50.0000 mg | Freq: Three times a day (TID) | INTRAMUSCULAR | Status: DC
  Administered 2024-11-04 – 2024-11-05 (×3): 50 mg via INTRAVENOUS
  Filled 2024-11-04 (×3): qty 2

## 2024-11-04 MED ORDER — INSULIN GLARGINE-YFGN 100 UNIT/ML ~~LOC~~ SOLN: 10.0000 [IU] | Freq: Every day | SUBCUTANEOUS | Status: DC

## 2024-11-04 MED ORDER — HYDROCORTISONE SOD SUC (PF) 100 MG IJ SOLR
100.0000 mg | Freq: Once | INTRAMUSCULAR | Status: AC
  Administered 2024-11-04: 100 mg via INTRAVENOUS
  Filled 2024-11-04: qty 2

## 2024-11-04 MED ORDER — INSULIN ASPART 100 UNIT/ML IJ SOLN
0.0000 [IU] | INTRAMUSCULAR | Status: DC
  Administered 2024-11-04: 15 [IU] via SUBCUTANEOUS
  Administered 2024-11-05: 7 [IU] via SUBCUTANEOUS
  Filled 2024-11-04: qty 15
  Filled 2024-11-04: qty 5
  Filled 2024-11-04: qty 7

## 2024-11-04 MED ORDER — FUROSEMIDE 10 MG/ML IJ SOLN
40.0000 mg | Freq: Two times a day (BID) | INTRAMUSCULAR | Status: DC
  Administered 2024-11-04 – 2024-11-06 (×4): 40 mg via INTRAVENOUS
  Filled 2024-11-04 (×4): qty 4

## 2024-11-04 MED ORDER — INSULIN ASPART 100 UNIT/ML IJ SOLN
10.0000 [IU] | Freq: Once | INTRAMUSCULAR | Status: AC
  Administered 2024-11-04: 10 [IU] via SUBCUTANEOUS
  Filled 2024-11-04: qty 10

## 2024-11-04 MED ORDER — INSULIN GLARGINE-YFGN 100 UNIT/ML ~~LOC~~ SOLN
7.0000 [IU] | Freq: Two times a day (BID) | SUBCUTANEOUS | Status: DC
  Administered 2024-11-04: 7 [IU] via SUBCUTANEOUS
  Filled 2024-11-04 (×2): qty 0.07

## 2024-11-04 MED ORDER — MAGNESIUM SULFATE 4 GM/100ML IV SOLN
4.0000 g | Freq: Once | INTRAVENOUS | Status: AC
  Administered 2024-11-04: 4 g via INTRAVENOUS
  Filled 2024-11-04: qty 100

## 2024-11-04 NOTE — Plan of Care (Signed)
  Problem: Education: Goal: Knowledge of General Education information will improve Description: Including pain rating scale, medication(s)/side effects and non-pharmacologic comfort measures Outcome: Progressing   Problem: Health Behavior/Discharge Planning: Goal: Ability to manage health-related needs will improve Outcome: Progressing   Problem: Clinical Measurements: Goal: Will remain free from infection Outcome: Progressing Goal: Respiratory complications will improve Outcome: Progressing   Problem: Activity: Goal: Risk for activity intolerance will decrease Outcome: Progressing   Problem: Nutrition: Goal: Adequate nutrition will be maintained Outcome: Progressing   Problem: Coping: Goal: Level of anxiety will decrease Outcome: Progressing   Problem: Pain Managment: Goal: General experience of comfort will improve and/or be controlled Outcome: Progressing   Problem: Safety: Goal: Ability to remain free from injury will improve Outcome: Progressing   Problem: Skin Integrity: Goal: Risk for impaired skin integrity will decrease Outcome: Progressing   Problem: Clinical Measurements: Goal: Cardiovascular complication will be avoided Outcome: Not Progressing   Problem: Elimination: Goal: Will not experience complications related to urinary retention Outcome: Not Progressing

## 2024-11-04 NOTE — Progress Notes (Cosign Needed Addendum)
 [facility] CLINIC CARDIOLOGY PROGRESS NOTE   Patient ID: Tammy Arias MRN: [id number] DOB/AGE: [birth year] 69 y.o.  Admit date: 10/27/2024 Referring Physician Dr. Juanita Picking Primary Physician Valora Lynwood FALCON, MD  Primary Cardiologist Dr. Florencio Reason for Consultation atrial fibrillation, GI bleed  HPI: Tammy Arias is a 68 y.o. female with a past medical history of coronary artery disease s/p CABG, ischemic cardiomyopathy, chronic HFrEF, history of atrial fibrillation s/p failed Watchman, pulmonary hypertension, hx GI bleed who presented to the ED on 10/27/2024 for bloody drainage from ileostomy. Treated for hemorrhagic shock with improvement in this but with persistent hypotension and fluid retention. Cardiology was consulted for further evaluation.   Interval History:  -Patient seen and examined this AM, resting in hospital bed.  -BP stable, remains on levo.  -No significant events noted on tele.   Review of systems complete and found to be negative unless listed above   Vitals:   11/04/24 0745 11/04/24 0800 11/04/24 0815 11/04/24 0830  BP: (!) 83/50 (!) 88/43 (!) 93/48 (!) 106/46  Pulse: 80 80 72   Resp: 10 11 11 17   Temp:  98.2 F (36.8 C)    TempSrc:  Oral    SpO2: 95% 95% 94%   Weight:      Height:         Intake/Output Summary (Last 24 hours) at 11/04/2024 0920 Last data filed at 11/04/2024 0900 Gross per 24 hour  Intake 1195.12 ml  Output 5637 ml  Net -4441.88 ml     PHYSICAL EXAM General: Chronically ill appearing female, well nourished, in no acute distress. HEENT: Normocephalic and atraumatic. Neck: No JVD.  Lungs: Normal respiratory effort on room air. Clear bilaterally to auscultation. No wheezes, crackles, rhonchi.  Heart: irregularly irregular, controlled rate. Normal S1 and S2 without gallops or murmurs. Radial & DP pulses 2+ bilaterally. Abdomen: Non-distended appearing.  Msk: Normal strength and tone for age. Extremities: No clubbing,  cyanosis or edema.   Neuro: Alert and oriented X 3. Psych: Mood appropriate, affect congruent.    LABS: Basic Metabolic Panel: Recent Labs    11/03/24 0555 11/04/24 0433  NA 128* 132*  K 4.7 4.0  CL 95* 97*  CO2 25 26  GLUCOSE 144* 141*  BUN 23 24*  CREATININE 1.43* 1.25*  CALCIUM  7.4* 8.3*  MG 1.9 1.5*  PHOS 2.0* 2.8   Liver Function Tests: Recent Labs    11/02/24 0438  ALBUMIN  3.4*   No results for input(s): LIPASE, AMYLASE in the last 72 hours. CBC: Recent Labs    11/03/24 0555 11/03/24 1049 11/03/24 1710 11/04/24 0433  WBC 6.1  --   --  6.1  HGB 7.8*   < > 7.6* 7.8*  HCT 23.9*   < > 22.9* 24.0*  MCV 96.8  --   --  97.6  PLT 52*  --   --  63*   < > = values in this interval not displayed.   Cardiac Enzymes: No results for input(s): CKTOTAL, CKMB, CKMBINDEX, TROPONINIHS in the last 72 hours. BNP: No results for input(s): BNP in the last 72 hours. D-Dimer: No results for input(s): DDIMER in the last 72 hours. Hemoglobin A1C: No results for input(s): HGBA1C in the last 72 hours. Fasting Lipid Panel: No results for input(s): CHOL, HDL, LDLCALC, TRIG, CHOLHDL, LDLDIRECT in the last 72 hours. Thyroid  Function Tests: No results for input(s): TSH, T4TOTAL, T3FREE, THYROIDAB in the last 72 hours.  Invalid input(s): FREET3 Anemia Panel: No results  for input(s): VITAMINB12, FOLATE, FERRITIN, TIBC, IRON , RETICCTPCT in the last 72 hours.  No results found.   ECHO 07/2024: 1. Left ventricular ejection fraction, by estimation, is 35 to 40%. The left ventricle has moderately decreased function. The left ventricle demonstrates global hypokinesis. Left ventricular diastolic parameters are indeterminate.   2. Mildly D-shaped interventricular septum suggestive of RV pressure/volume overload. Peak RV-RA gradient 36 mmHg. Right ventricular systolic function is moderately reduced. The right ventricular size is severely  enlarged.   3. Left atrial size was severely dilated.   4. Right atrial size was severely dilated.   5. S/p mitral valve repair via mTEER with 1 Mitraclip, position appears  A2/P2. Mean gradient 5 mmHg. Moderate residual mitral regurgitation.   6. The tricuspid valve is abnormal. Tricuspid valve regurgitation is severe.   7. The aortic valve is tricuspid. There is mild calcification of the aortic valve. Aortic valve regurgitation is not visualized. No aortic stenosis is present.   8. The IVC was not visualized.   9. The patient was in atrial fibrillation.   TELEMETRY (personally reviewed): atrial fibrillation PVCs rate 80s  EKG (personally reviewed): Atrial fibrillation low voltage nonspecific ST-T wave changes 78 bpm  DATA reviewed by me 11/04/24: last 24h vitals tele labs imaging I/O, hospitalist progress note  Principal Problem:   Hemorrhagic shock (HCC) Active Problems:   Acute renal failure   Shock (HCC)   Acute GI bleeding    ASSESSMENT AND PLAN: Tammy Arias is a 68 y.o. female with a past medical history of coronary artery disease s/p CABG, ischemic cardiomyopathy, chronic HFrEF, history of atrial fibrillation s/p failed Watchman, pulmonary hypertension, hx GI bleed who presented to the ED on 10/27/2024 for bloody drainage from ileostomy. Treated for hemorrhagic shock with improvement in this but with persistent hypotension and fluid retention. Cardiology was consulted for further evaluation.   # Acute on chronic HFrEF # GI bleed, hemorrhagic shock # Coronary artery disease # Chronic atrial fibrillation -Continue lasix  40 mg daily as BP tolerates.  -Midodrine warrick as per PCCM team.  -Given comorbidities, inability to tolerate anticoagulation, patient would likely not be a candidate for advanced therapies.   This patient's case was discussed and created with Dr. Ammon and he is in agreement.  Signed:  Danita Bloch, PA-C  11/04/2024, 9:20 AM [facility]  Cardiology

## 2024-11-04 NOTE — Progress Notes (Signed)
 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      She reports to ED for generalized malaise and is being admitted for hypotension +GIB from ileostomy   She states she was in her normal state of health on Thanksgiving and was able to eat well. Started feeling bad on Friday. Was unable to eat on Saturday. States she has had a lot of bloody drainage from ileostomy.  had progressive weakness since yesterday.   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  Platelet with significant drop overnight but  appears to be chronic when trended over past 5 years.  10/30/24- patient remains critically ill, her renal function is improving, remains with electrolyte derrangements including low phos, low Na, low Ca, elevated glucose.  CBC with borderline low Hb, thrombocytopenia, eosinophilia. Blood cultures negative to date.  RRT is ongoing. S/p 1 unit prbc transfusion today.  10/31/24 - patient with severe electrolyte derrangements, hyponatremia and thrombocytopenia. Clinically improved but still critically ill on vasopressor support 11/01/24- patient restarted on levophed  overnight. No bleeding per ostomy. RIJ TLC +.   Plan to transition to TRH service. 11/02/24- patient required levophed  overnight and remains in ICU.  She is clinically not worse and h/h is trending up. Blood cultures negative. WBC count normal.  11/03/24- patient with component of cardiogenic shock.  She has extensive cardiac history and we have consulted cardiology for additional input. Her main reason for being in ICU is hemoragic shock which is resolved but now cardiac etiology is main culprit of illness.   11/04/24: remains on vasopressors, but otherwise mental status is improved  Interim History / Subjective:  Feels well, denies any complaints. Remains on pressors. No more GI bleed  Objective    Blood pressure (!) 97/55, pulse 81, temperature 98 F (36.7 C), temperature source Oral, resp. rate 16, height 5' 1 (1.549 m), weight 69.1 kg, SpO2 99%. CVP:  [20 mmHg-73 mmHg] 27 mmHg      Intake/Output Summary (Last 24 hours) at 11/04/2024 0825 Last data filed at 11/04/2024 0600 Gross per 24 hour  Intake 1047.09 ml  Output 5517 ml  Net -4469.91 ml   Filed Weights   10/31/24 1638 11/01/24 0500 11/04/24 0500  Weight: 66.4 kg 73.3 kg 69.1 kg    Examination: Physical Exam Constitutional:  Appearance: Normal appearance.  Cardiovascular:     Rate and Rhythm: Normal rate and regular rhythm.     Pulses: Normal pulses.     Heart  sounds: Normal heart sounds.  Pulmonary:     Effort: Pulmonary effort is normal.     Breath sounds: Normal breath sounds.  Neurological:     General: No focal deficit present.     Mental Status: She is alert and oriented to person, place, and time. Mental status is at baseline.     Assessment and Plan   #Hemorrhagic Shock - Improved #Recurrent GI Bleed  #Acute Blood Loss Anemia #Cardiogenic Shock  #Acute on Chronic HFrEF #AKI on CKD #Liver Cirrhosis #Atrial Fibrillation s/p Watchman's #Hypothyroidism #History of adrenal insufficiency  Neuro - mental status is improved and at baseline. CV - History of HFrEF with cardiomyopathy and biventricular failure and known elevated RV pressure with associated mitral regurgitation that did not improve with mitral clip procedure. Hypotension initially due to hemorrhagic shock, treated with blood transfusion. Remains hypotension with cardiogenic shock as well as adrenal insufficiency on the differential. She was previously found to have a low cortisol level (similar situation 07/2024) that responded well to stress dose steroids which we will re-attempt. Not attempting dobutamine  at this point as she is trending towards improvement.  Pulm - no active issues, remains on room air. GI - history of liver cirrhosis with previously noted varices. GI bleed likely from ectatic vessels around the ostomy anastomosis site which is a recurring problem for Tammy Arias. Seen by GI who have signed off given extensive previous workup and no further interventions that can be offered. Renal - AKI on CKD has improved, with improved kidney function. She was dialyzed this admission but this has been discontinued, and she remains on IV furosemide  (40 mg bid). Appreciate input from nephrology. Endo - history of adrenal insufficiency with previous AM cortisol level significantly depressed, with previous response to hydrocortisone . Starting stress dose steroids. Continue home  levothyroxine  and ICU glycemic protocol. Hem/Onc - holding anti-coagulation given GI bleed. Given heart failure will liberalize transfusion strategy for goal hemoglobin > 8 ID - no sign of active infection.  Labs   CBC: Recent Labs  Lab 10/29/24 0619 10/29/24 1133 10/30/24 0355 10/30/24 1100 10/31/24 0419 10/31/24 0819 11/01/24 0534 11/01/24 1118 11/02/24 0438 11/02/24 1701 11/03/24 0555 11/03/24 1049 11/03/24 1710 11/04/24 0433  WBC 5.4  --  3.6*  --  4.9  --  4.7  --  6.0  --  6.1  --   --  6.1  NEUTROABS 4.5  --  2.3  --   --   --   --   --   --   --   --   --   --   --   HGB 8.4*   < > 7.1*   < > 7.8*   < > 7.4*   < > 8.1* 8.2* 7.8* 8.5* 7.6* 7.8*  HCT 24.1*   < > 20.9*   < > 23.0*   < > 22.5*   < > 24.7* 25.4* 23.9* 26.7* 22.9* 24.0*  MCV 91.6  --  95.0  --  91.3  --  93.8  --  95.0  --  96.8  --   --  97.6  PLT 66*  --  40*  --  41*  --  39*  --  49*  --  52*  --   --  63*   < > =  values in this interval not displayed.    Basic Metabolic Panel: Recent Labs  Lab 10/31/24 0419 11/01/24 0534 11/02/24 0438 11/03/24 0555 11/04/24 0433  NA 126* 126* 128* 128* 132*  K 5.0 5.0 4.9 4.7 4.0  CL 93* 95* 96* 95* 97*  CO2 25 27 25 25 26   GLUCOSE 184* 134* 153* 144* 141*  BUN 19 16 19 23  24*  CREATININE 2.07* 1.56* 1.52* 1.43* 1.25*  CALCIUM  7.5* 7.2* 7.3* 7.4* 8.3*  MG 1.7 1.7 1.9 1.9 1.5*  PHOS 3.4 1.7* 1.9* 2.0* 2.8   GFR: Estimated Creatinine Clearance: 37.8 mL/min (A) (by C-G formula based on SCr of 1.25 mg/dL (H)). Recent Labs  Lab 10/28/24 1325 10/28/24 1602 10/28/24 1930 10/29/24 0619 11/01/24 0534 11/02/24 0438 11/03/24 0555 11/03/24 1046 11/04/24 0433  PROCALCITON  --  0.34  --   --   --   --   --   --   --   WBC  --   --   --    < > 4.7 6.0 6.1  --  6.1  LATICACIDVEN 4.1* 4.6* 4.4*  --   --   --   --  1.3  --    < > = values in this interval not displayed.    Liver Function Tests: Recent Labs  Lab 10/29/24 1556 10/30/24 0355 10/31/24 0419  11/01/24 0534 11/02/24 0438  ALBUMIN  3.4* 3.6 3.3* 3.3* 3.4*   No results for input(s): LIPASE, AMYLASE in the last 168 hours. No results for input(s): AMMONIA in the last 168 hours.  ABG    Component Value Date/Time   PHART 7.47 (H) 03/01/2024 1055   PCO2ART 40 03/01/2024 1055   PO2ART 112 (H) 03/01/2024 1055   HCO3 29.1 (H) 03/01/2024 1055   ACIDBASEDEF 2.7 (H) 02/29/2024 0952   O2SAT 69.8 11/03/2024 1039     Coagulation Profile: Recent Labs  Lab 10/28/24 1039  INR 1.4*    Cardiac Enzymes: No results for input(s): CKTOTAL, CKMB, CKMBINDEX, TROPONINI in the last 168 hours.  HbA1C: Hgb A1c MFr Bld  Date/Time Value Ref Range Status  10/28/2024 06:02 PM 5.5 4.8 - 5.6 % Final    Comment:    (NOTE)         Prediabetes: 5.7 - 6.4         Diabetes: >6.4         Glycemic control for adults with diabetes: <7.0   02/28/2024 06:08 AM 5.9 (H) 4.8 - 5.6 % Final    Comment:    (NOTE) Pre diabetes:          5.7%-6.4%  Diabetes:              >6.4%  Glycemic control for   <7.0% adults with diabetes     CBG: Recent Labs  Lab 11/03/24 0800 11/03/24 1130 11/03/24 1639 11/03/24 2122 11/04/24 0749  GLUCAP 120* 250* 252* 219* 145*    Review of Systems:   N/A  Past Medical History:  She,  has a past medical history of AC (acromioclavicular) joint bone spurs, right, Anemia, Arthritis, Atrial fibrillation (HCC), Cardiac murmur, CHF (congestive heart failure) (HCC), Chronic anticoagulation, Clostridium difficile colitis (02/28/2024), Cor triatriatum, Coronary artery disease, Cortical cataract, DOE (dyspnea on exertion), DOE (dyspnea on exertion), GERD (gastroesophageal reflux disease), HFrEF (heart failure with reduced ejection fraction) (HCC), History of shingles (2004), Hyperlipidemia, Hypertension, Hypothyroidism, IBS (irritable bowel syndrome), Ischemic cardiomyopathy, Lower GI bleed (06/07/2023), Lumbar scoliosis, Lumbar spinal stenosis, Melena (12/17/2018),  Migraines,  Osteoporosis, Pancolitis (02/28/2024), Pulmonary HTN (HCC), S/P CABG x 3 (01/28/2004), Sleep apnea, T2DM (type 2 diabetes mellitus) (HCC), TIA (transient ischemic attack) (05/29/2016), and Vitamin B 12 deficiency.   Surgical History:   Past Surgical History:  Procedure Laterality Date   CARDIAC CATHETERIZATION     CATARACT EXTRACTION W/ INTRAOCULAR LENS IMPLANT Bilateral    Cataract Extraction with IOL   COLECTOMY  02/28/2024   Procedure: COLECTOMY, TOTAL;  Surgeon: Lane Shope, MD;  Location: [facility] ORS;  Service: General;;   COLONOSCOPY N/A 12/19/2018   Procedure: COLONOSCOPY;  Surgeon: Janalyn Keene NOVAK, MD;  Location: [facility] ENDOSCOPY;  Service: Endoscopy;  Laterality: N/A;   COLONOSCOPY WITH PROPOFOL  N/A 06/17/2020   Procedure: COLONOSCOPY WITH PROPOFOL ;  Surgeon: Janalyn Keene NOVAK, MD;  Location: [facility] ENDOSCOPY;  Service: Endoscopy;  Laterality: N/A;   COLONOSCOPY WITH PROPOFOL  N/A 03/30/2023   Procedure: COLONOSCOPY WITH PROPOFOL ;  Surgeon: Onita Elspeth Sharper, DO;  Location: [facility] ENDOSCOPY;  Service: Endoscopy;  Laterality: N/A;   COR TRIATRIATUM REPAIR N/A 01/28/2004   CORONARY ARTERY BYPASS GRAFT N/A 01/28/2004   3v CABG   ESOPHAGOGASTRODUODENOSCOPY N/A 12/19/2018   Procedure: ESOPHAGOGASTRODUODENOSCOPY (EGD);  Surgeon: Janalyn Keene NOVAK, MD;  Location: [facility] ENDOSCOPY;  Service: Endoscopy;  Laterality: N/A;   ESOPHAGOGASTRODUODENOSCOPY N/A 06/11/2024   Procedure: EGD (ESOPHAGOGASTRODUODENOSCOPY);  Surgeon: Jinny Carmine, MD;  Location: [facility] ENDOSCOPY;  Service: Endoscopy;  Laterality: N/A;   ESOPHAGOGASTRODUODENOSCOPY N/A 07/18/2024   Procedure: EGD (ESOPHAGOGASTRODUODENOSCOPY);  Surgeon: Onita Elspeth Sharper, DO;  Location[facility] ENDOSCOPY;  Service: Gastroenterology;  Laterality: N/A;  with Enteroscopy        DM on Plavix   ESOPHAGOGASTRODUODENOSCOPY (EGD) WITH PROPOFOL  N/A 06/17/2020   Procedure: ESOPHAGOGASTRODUODENOSCOPY (EGD) WITH PROPOFOL ;  Surgeon:  Janalyn Keene NOVAK, MD;  Location: [facility] ENDOSCOPY;  Service: Endoscopy;  Laterality: N/A;   ESOPHAGOGASTRODUODENOSCOPY (EGD) WITH PROPOFOL  N/A 03/30/2023   Procedure: ESOPHAGOGASTRODUODENOSCOPY (EGD) WITH PROPOFOL ;  Surgeon: Onita Elspeth Sharper, DO;  Location: [facility] ENDOSCOPY;  Service: Endoscopy;  Laterality: N/A;   GIVENS CAPSULE STUDY N/A 07/21/2024   Procedure: IMAGING PROCEDURE, GI TRACT, INTRALUMINAL, VIA CAPSULE;  Surgeon: Therisa Bi, MD;  Location: [facility] ENDOSCOPY;  Service: Gastroenterology;  Laterality: N/A;   LAPAROTOMY N/A 02/28/2024   Procedure: LAPAROTOMY, EXPLORATORY;  Surgeon: Lane Shope, MD;  Location: [facility] ORS;  Service: General;  Laterality: N/A;   LEFT ATRIAL APPENDAGE OCCLUSION     RIGHT/LEFT HEART CATH AND CORONARY ANGIOGRAPHY Bilateral 03/29/2017   Procedure: Right/Left Heart Cath and Coronary Angiography;  Surgeon: Vinie DELENA Jude, MD;  Location: [facility] INVASIVE CV LAB;  Service: Cardiovascular;  Laterality: Bilateral;   TOTAL KNEE ARTHROPLASTY Right 04/05/2022   Procedure: TOTAL KNEE ARTHROPLASTY;  Surgeon: Kathlynn Sharper, MD;  Location: [facility] ORS;  Service: Orthopedics;  Laterality: Right;   TUBAL LIGATION     VENTRAL HERNIA REPAIR N/A 04/21/2017   Procedure: HERNIA REPAIR VENTRAL ADULT;  Surgeon: Claudene Larinda Bolder, MD;  Location: [facility] ORS;  Service: General;  Laterality: N/A;     Social History:   reports that she quit smoking about 25 years ago. Her smoking use included cigarettes. She has never used smokeless tobacco. She reports that she does not drink alcohol and does not use drugs.   Family History:  Her family history includes Breast cancer in her cousin; Cancer in her sister and sister; Diabetes in her father; Valvular heart disease in her mother.   Allergies Allergies  Allergen Reactions   Vioxx [Rofecoxib] Swelling     Home Medications  Prior to Admission  medications   Medication Sig Start Date End Date Taking? Authorizing Provider  albuterol   (PROVENTIL  HFA;VENTOLIN  HFA) 108 (90 Base) MCG/ACT inhaler Inhale 2 puffs into the lungs every 6 (six) hours as needed for wheezing or shortness of breath.   Yes [provider]  allopurinol  (ZYLOPRIM ) 300 MG tablet Take 300 mg by mouth daily.   Yes [provider]  cyanocobalamin  (VITAMIN B12) 1000 MCG tablet Take 1,000 mcg by mouth daily.   Yes [provider]  empagliflozin  (JARDIANCE ) 10 MG TABS tablet Take 1 tablet by mouth daily. 04/17/24  Yes [provider]  fluticasone  (FLONASE ) 50 MCG/ACT nasal spray Place 2 sprays into the nose daily. 05/20/24  Yes [provider]  iron  polysaccharides (NIFEREX) 150 MG capsule Take 1 capsule (150 mg total) by mouth daily. 08/22/24 11/20/24 Yes Maree Hue, MD  latanoprost  (XALATAN ) 0.005 % ophthalmic solution Place 1 drop into both eyes at bedtime.   Yes [provider]  levothyroxine  (SYNTHROID ) 50 MCG tablet Take 50 mcg by mouth daily before breakfast.   Yes [provider]  magnesium  oxide (MAG-OX) 400 MG tablet Take 400 mg by mouth. Take 1 tablet (400 mg total) by mouth once daily 05/20/24 05/20/25 Yes [provider]  metFORMIN  (GLUCOPHAGE ) 1000 MG tablet Take 1,000 mg by mouth 2 (two) times daily with a meal.   Yes [provider]  nadolol (CORGARD) 20 MG tablet Take 20 mg by mouth daily. 08/12/24 08/12/25 Yes [provider]  pantoprazole  (PROTONIX ) 40 MG tablet Take 40 mg by mouth daily. 01/28/21  Yes [provider]  rosuvastatin  (CRESTOR ) 5 MG tablet Take 5 mg by mouth daily. 02/20/24 02/19/25 Yes [provider]  spironolactone  (ALDACTONE ) 25 MG tablet Take 0.5 tablets (12.5 mg total) by mouth daily. 03/31/24  Yes Fausto Burnard LABOR, DO  torsemide  (DEMADEX ) 20 MG tablet Take 1 tablet (20 mg total) by mouth daily as needed (for weight gain or swelling). 03/31/24  Yes Fausto Burnard LABOR, DO  aspirin  EC 81 MG tablet Take 81 mg by mouth daily. Swallow whole.     [provider]  Blood Glucose Monitoring Suppl (ACCU-CHEK GUIDE ME) w/Device KIT  08/01/24   [provider]  carvedilol  (COREG ) 3.125 MG tablet Take 3.125 mg by mouth 2 (two) times daily with a meal.    [provider]  phytonadione  (VITAMIN K) 5 MG tablet Take 0.5 tablets (2.5 mg total) by mouth daily for 5 days Patient not taking: Reported on 10/27/2024 08/22/24   Maree Hue, MD     The patient is critically ill due to circulatory shock, GI bleed.  Critical care was necessary to treat or prevent imminent or life-threatening deterioration. I personally performed titration, monitoring, and management of vasopressor/ionotrope infusion and blood/plasma transfusion. Critical care time was spent by me on the following activities: development of a treatment plan with the patient and/or surrogate as well as nursing, discussions with consultants, evaluation of the patient's response to treatment, examination of the patient, obtaining a history from the patient or surrogate, ordering and performing treatments and interventions, ordering and review of laboratory studies, ordering and review of radiographic studies, review of telemetry data including pulse oximetry, re-evaluation of patient's condition and participation in multidisciplinary rounds.   I personally spent 33 minutes providing critical care not including any separately billable procedures.   Belva November, MD [facility] 11/04/2024 3:11 PM

## 2024-11-04 NOTE — TOC Progression Note (Signed)
 Transition of Care [facility]) - Progression Note    Patient Details  Name: Tammy NARAYANAN MRN: [id number] Date of Birth: [birth year]  Transition of Care [facility]) CM/SW Contact  K'La JINNY Ruts, LCSW Phone Number: 11/04/2024, 1:29 PM  Clinical Narrative:    Chart reviewed. Called [facility] from [place]. She confirmed PT/OT with the patient. Patient will D/C with [facility] services.     Barriers to Discharge: Continued Medical Work up               Expected Discharge Plan and Services       Living arrangements for the past 2 months: Single Family Home                                       Social Drivers of Health (SDOH) Interventions SDOH Screenings   Food Insecurity: No Food Insecurity (10/29/2024)  Housing: Low Risk  (10/29/2024)  Transportation Needs: No Transportation Needs (10/29/2024)  Utilities: Not At Risk (10/29/2024)  Depression (PHQ2-9): Low Risk  (09/04/2024)  Financial Resource Strain: Medium Risk (05/20/2024)   Received from [facility]  Social Connections: Moderately Integrated (10/29/2024)  Tobacco Use: Medium Risk (10/27/2024)    Readmission Risk Interventions    10/28/2024    3:01 PM 08/16/2024    1:36 PM 06/11/2024    4:23 PM  Readmission Risk Prevention Plan  Transportation Screening Complete Complete Complete  Medication Review Oceanographer) Complete Complete Complete  PCP or Specialist appointment within 3-5 days of discharge Complete Complete   HRI or Home Care Consult   --  SW Recovery Care/Counseling Consult Complete Complete   Palliative Care Screening Not Applicable Not Applicable Not Applicable  Skilled Nursing Facility Not Applicable Not Applicable Not Applicable

## 2024-11-04 NOTE — Plan of Care (Signed)
  Problem: Education: Goal: Knowledge of General Education information will improve Description: Including pain rating scale, medication(s)/side effects and non-pharmacologic comfort measures Outcome: Progressing   Problem: Health Behavior/Discharge Planning: Goal: Ability to manage health-related needs will improve Outcome: Progressing   Problem: Clinical Measurements: Goal: Will remain free from infection Outcome: Progressing Goal: Diagnostic test results will improve Outcome: Progressing Goal: Respiratory complications will improve Outcome: Progressing   Problem: Nutrition: Goal: Adequate nutrition will be maintained Outcome: Progressing   Problem: Coping: Goal: Level of anxiety will decrease Outcome: Progressing   Problem: Pain Managment: Goal: General experience of comfort will improve and/or be controlled Outcome: Progressing   Problem: Safety: Goal: Ability to remain free from injury will improve Outcome: Progressing   Problem: Skin Integrity: Goal: Risk for impaired skin integrity will decrease Outcome: Progressing

## 2024-11-04 NOTE — Progress Notes (Signed)
 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 She reports to ED for generalized malaise and is being admitted for Hemorrhagic shock (HCC) [R57.8] Shock (HCC) [R57.9] Acute GI bleeding [K92.2] Acute renal failure, unspecified acute renal failure type [N17.9]  Patient is known to our practice from previous admissions and has seen Dr Dennise in he office once.   Update: Patient sitting up in bed Alert and oriented Remains on room air States she feels well  Urine output 4417 ml  Objective:  Vital signs in last 24 hours:  Temp:  [97.5 F (36.4 C)-98.3 F (36.8 C)] 98.2 F (36.8 C) (12/08 1430) Pulse Rate:  [54-130] 130 (12/08 1215) Resp:  [10-33] 33 (12/08 1215) BP: (81-134)/(32-81) 132/56 (12/08 1215) SpO2:  [94 %-100 %] 97 % (12/08 1215) Weight:  [69.1 kg] 69.1 kg (12/08 0500)  Weight change:  Filed Weights   10/31/24 1638 11/01/24 0500 11/04/24 0500  Weight: 66.4 kg 73.3 kg 69.1 kg    Intake/Output: I/O last 3 completed shifts: In: 1087.1 [P.O.:480; I.V.:326.6; IV Piggyback:280.5] Out: 7177 [Urine:5267; Stool:1910]   Intake/Output this shift:  Total I/O In: 741.7 [P.O.:240; I.V.:40.6; Blood:331; IV Piggyback:130.1] Out: 1495 [Urine:1245; Stool:250]  Physical Exam: General: NAD  Head: Normocephalic, atraumatic. Moist oral mucosa  Eyes: Anicteric  Lungs:  Clear to auscultation, normal  effort  Heart: Regular rate and rhythm  Abdomen:  Soft, nontender. Ileostomy   Extremities:  1+ peripheral edema.  Neurologic: Awake, alert, conversant  Skin: Warm,dry, no rash  Access: Rt groin Trialysis     Basic Metabolic Panel: Recent Labs  Lab 10/31/24 0419 11/01/24 0534 11/02/24 0438 11/03/24 0555 11/04/24 0433  NA 126* 126* 128* 128* 132*  K 5.0 5.0 4.9 4.7 4.0  CL 93* 95* 96* 95* 97*  CO2 25 27 25 25 26   GLUCOSE 184* 134* 153* 144* 141*  BUN 19 16 19 23  24*  CREATININE 2.07* 1.56* 1.52* 1.43* 1.25*  CALCIUM  7.5* 7.2* 7.3* 7.4* 8.3*  MG 1.7 1.7 1.9 1.9 1.5*  PHOS 3.4 1.7* 1.9* 2.0* 2.8    Liver Function Tests: Recent Labs  Lab 10/29/24 1556 10/30/24 0355 10/31/24 0419 11/01/24 0534 11/02/24 0438  ALBUMIN  3.4* 3.6 3.3* 3.3* 3.4*   No results for input(s): LIPASE, AMYLASE in the last 168 hours.  No results for input(s): AMMONIA in the last 168 hours.  CBC: Recent Labs  Lab 10/29/24 0619 10/29/24 1133 10/30/24 0355 10/30/24 1100 10/31/24 0419 10/31/24 0819 11/01/24 0534 11/01/24 1118 11/02/24 0438 11/02/24 1701 11/03/24 0555 11/03/24 1049 11/03/24 1710 11/04/24 0433  WBC 5.4  --  3.6*  --  4.9  --  4.7  --  6.0  --  6.1  --   --  6.1  NEUTROABS 4.5  --  2.3  --   --   --   --   --   --   --   --   --   --   --   HGB 8.4*   < > 7.1*   < >  7.8*   < > 7.4*   < > 8.1* 8.2* 7.8* 8.5* 7.6* 7.8*  HCT 24.1*   < > 20.9*   < > 23.0*   < > 22.5*   < > 24.7* 25.4* 23.9* 26.7* 22.9* 24.0*  MCV 91.6  --  95.0  --  91.3  --  93.8  --  95.0  --  96.8  --   --  97.6  PLT 66*  --  40*  --  41*  --  39*  --  49*  --  52*  --   --  63*   < > = values in this interval not displayed.    Cardiac Enzymes: No results for input(s): CKTOTAL, CKMB, CKMBINDEX, TROPONINI in the last 168 hours.  BNP: Invalid input(s): POCBNP  CBG: Recent Labs  Lab 11/03/24 1130 11/03/24 1639 11/03/24 2122 11/04/24 0749 11/04/24 1128  GLUCAP 250* 252* 219* 145*  302*    Microbiology: Results for orders placed or performed during the hospital encounter of 10/27/24  Resp panel by RT-PCR (RSV, Flu A&B, Covid) Anterior Nasal Swab     Status: None   Collection Time: 10/27/24 11:01 AM   Specimen: Anterior Nasal Swab  Result Value Ref Range Status   SARS Coronavirus 2 by RT PCR NEGATIVE NEGATIVE Final    Comment: (NOTE) SARS-CoV-2 target nucleic acids are NOT DETECTED.  The SARS-CoV-2 RNA is generally detectable in upper respiratory specimens during the acute phase of infection. The lowest concentration of SARS-CoV-2 viral copies this assay can detect is 138 copies/mL. A negative result does not preclude SARS-Cov-2 infection and should not be used as the sole basis for treatment or other patient management decisions. A negative result may occur with  improper specimen collection/handling, submission of specimen other than nasopharyngeal swab, presence of viral mutation(s) within the areas targeted by this assay, and inadequate number of viral copies(<138 copies/mL). A negative result must be combined with clinical observations, patient history, and epidemiological information. The expected result is Negative.  Fact Sheet for Patients:  bloggercourse.com  Fact Sheet for Healthcare Providers:  seriousbroker.it  This test is no t yet approved or cleared by the [place]  FDA and  has been authorized for detection and/or diagnosis of SARS-CoV-2 by FDA under an Emergency Use Authorization (EUA). This EUA will remain  in effect (meaning this test can be used) for the duration of the COVID-19 declaration under Section 564(b)(1) of the Act, 21 U.S.C.section 360bbb-3(b)(1), unless the authorization is terminated  or revoked sooner.       Influenza A by PCR NEGATIVE NEGATIVE Final   Influenza B by PCR NEGATIVE NEGATIVE Final    Comment: (NOTE) The Xpert Xpress SARS-CoV-2/FLU/RSV plus assay is  intended as an aid in the diagnosis of influenza from Nasopharyngeal swab specimens and should not be used as a sole basis for treatment. Nasal washings and aspirates are unacceptable for Xpert Xpress SARS-CoV-2/FLU/RSV testing.  Fact Sheet for Patients: bloggercourse.com  Fact Sheet for Healthcare Providers: seriousbroker.it  This test is not yet approved or cleared by the [place]  FDA and has been authorized for detection and/or diagnosis of SARS-CoV-2 by FDA under an Emergency Use Authorization (EUA). This EUA will remain in effect (meaning this test can be used) for the duration of the COVID-19 declaration under Section 564(b)(1) of the Act, 21 U.S.C. section 360bbb-3(b)(1), unless the authorization is terminated or revoked.     Resp Syncytial Virus by PCR NEGATIVE NEGATIVE Final  Comment: (NOTE) Fact Sheet for Patients: bloggercourse.com  Fact Sheet for Healthcare Providers: seriousbroker.it  This test is not yet approved or cleared by the [place]  FDA and has been authorized for detection and/or diagnosis of SARS-CoV-2 by FDA under an Emergency Use Authorization (EUA). This EUA will remain in effect (meaning this test can be used) for the duration of the COVID-19 declaration under Section 564(b)(1) of the Act, 21 U.S.C. section 360bbb-3(b)(1), unless the authorization is terminated or revoked.  Performed at [facility], [address] [place], [place] [address]   Blood Culture (routine x 2)     Status: None   Collection Time: 10/27/24 12:18 PM   Specimen: Right Antecubital; Blood  Result Value Ref Range Status   Specimen Description RIGHT ANTECUBITAL  Final   Special Requests   Final    BOTTLES DRAWN AEROBIC AND ANAEROBIC Blood Culture adequate volume   Culture   Final    NO GROWTH 5 DAYS Performed at [facility], [address] [place], [place] [address]    Report Status 11/01/2024 FINAL  Final  Blood Culture (routine x 2)     Status: None   Collection Time: 10/27/24 12:18 PM   Specimen: Left Antecubital; Blood  Result Value Ref Range Status   Specimen Description LEFT ANTECUBITAL  Final   Special Requests   Final    BOTTLES DRAWN AEROBIC AND ANAEROBIC Blood Culture results may not be optimal due to an inadequate volume of blood received in culture bottles   Culture   Final    NO GROWTH 5 DAYS Performed at [facility], [address] [place], [place] [address]    Report Status 11/01/2024 FINAL  Final  MRSA Next Gen by PCR, Nasal     Status: Abnormal   Collection Time: 10/27/24  1:38 PM   Specimen: Nasal Mucosa; Nasal Swab  Result Value Ref Range Status   MRSA by PCR Next Gen DETECTED (A) NOT DETECTED Final    Comment: RESULT CALLED TO, READ BACK BY AND VERIFIED WITH: KATHERINE CLAYTON @1520  10/27/24 MJU (NOTE) The GeneXpert MRSA Assay (FDA approved for NASAL specimens only), is one component of a comprehensive MRSA colonization surveillance program. It is not intended to diagnose MRSA infection nor to guide or monitor treatment for MRSA infections. Test performance is not FDA approved in patients less than 75 years old. Performed at [facility], [address] [place], [place] [address]     Coagulation Studies: No results for input(s): LABPROT, INR in the last 72 hours.   Urinalysis: No results for input(s): COLORURINE, LABSPEC, PHURINE, GLUCOSEU, HGBUR, BILIRUBINUR, KETONESUR, PROTEINUR, UROBILINOGEN, NITRITE, LEUKOCYTESUR in the last 72 hours.  Invalid input(s): APPERANCEUR     Imaging: No results found.     Medications:    norepinephrine  (LEVOPHED ) Adult infusion Stopped (11/04/24 1226)    alteplase   2 mg Intracatheter Once   Chlorhexidine  Gluconate Cloth  6 each Topical Nightly   feeding supplement (GLUCERNA SHAKE)  237 mL Oral  TID BM   furosemide   40 mg Intravenous BID   hydrocortisone  sod succinate (SOLU-CORTEF ) inj  50 mg Intravenous Q8H   insulin  aspart  0-20 Units Subcutaneous TID WC   insulin  aspart  0-5 Units Subcutaneous QHS   insulin  glargine-yfgn  7 Units Subcutaneous Daily   latanoprost   1 drop Both Eyes QHS   levothyroxine   50 mcg Oral QAC breakfast   midodrine   15 mg Oral TID WC   multivitamin  1 tablet Oral QHS   pantoprazole   40 mg Oral Q12H   phytonadione   2.5 mg Oral Daily   sodium chloride  flush  3 mL Intravenous Q12H   acetaminophen , docusate sodium , ipratropium-albuterol , ondansetron  (ZOFRAN ) IV, mouth rinse, oxyCODONE , polyethylene glycol, sodium chloride  flush, traZODone   Assessment/ Plan:  Tammy Arias is a 68 y.o.  female presents to ED with weakness and has been admitted for Hemorrhagic shock (HCC) [R57.8] Shock (HCC) [R57.9] Acute GI bleeding [K92.2] Acute renal failure, unspecified acute renal failure type [N17.9]   Acute Kidney Injury with hyperkalemia, with baseline creatinine 0.92 on 08/22/24.  Acute kidney injury appears multifactorial at this time from hypotension and dehydration. Potassium 6.1 on admission. Corrected some with shifting measures. CRRT from 12/1 to 12/3.   Remains on 1 pressor. Given evening dose of Furosemide  yesterday due to elevated CVP. Will continue twice daily diuresis. Appreciate nursing staff removing HD temp cath. Will continue to monitor.   Lab Results  Component Value Date   CREATININE 1.25 (H) 11/04/2024   CREATININE 1.43 (H) 11/03/2024   CREATININE 1.52 (H) 11/02/2024    Intake/Output Summary (Last 24 hours) at 11/04/2024 1438 Last data filed at 11/04/2024 1436 Gross per 24 hour  Intake 1258.31 ml  Output 5762 ml  Net -4503.69 ml    2. Acute metabolic acidosis, S bicarb 9 on ED arrival. Resolved  3. Anemia with suspected acute blood loss Lab Results  Component Value Date   HGB 7.8 (L) 11/04/2024  Has received blood transfusions  during this admission. GI has no plans to repeat EGD due to current and recent workups. Hgb slowly improving  4. Hypotension, hemorrhagic shock. Remains on low dose of Levo   LOS: 8 Jonathin Heinicke 12/8/20252:38 PM

## 2024-11-04 NOTE — Consult Note (Signed)
 PHARMACY CONSULT NOTE  Pharmacy Consult for Electrolyte Monitoring and Replacement   Recent Labs: Potassium (mmol/L)  Date Value  11/04/2024 4.0   Magnesium  (mg/dL)  Date Value  87/91/7974 1.5 (L)   Calcium  (mg/dL)  Date Value  87/91/7974 8.3 (L)   Albumin  (g/dL)  Date Value  87/93/7974 3.4 (L)  05/18/2020 4.7   Phosphorus (mg/dL)  Date Value  87/91/7974 2.8   Sodium (mmol/L)  Date Value  11/04/2024 132 (L)   Assessment: 68 y/o female with h/o ischemic cardiomyopathy, gastric AVM, GI angiodysplasia, IBS-D, IDA, s/p watchman device, cholelithiasis, TIA, hypothyroidism, pulmonary hypertension, GERD, barrett's esophagus, HLD, HTN, OSA, CAD s/p CABG x 3, PAF, CHF, cirrhosis, portal hypertension, gout, CKD III, B12 deficiency, ventral hernia s/p mesh repair 2018, colonic angioectasias, diverticulosis, DDD, hiatal hernia, esophageal varices. Pharmacy is asked to follow and replace electrolytes while in CCU  Diuretics: furosemide  40 mg IV BID  Goal of Therapy:  Electrolytes WNL  Plan:  --Stable hyponatremia. Defer management to PCCM team --4 grams IV magnesium  sulfate x 1 --No additional replacement needed at this time.  --Follow-up electrolytes with AM labs tomorrow  Adriana JONETTA Bolster, PharmD, BCPS Clinical Pharmacist 11/04/2024 7:16 AM

## 2024-11-04 NOTE — Progress Notes (Signed)
 Palliative Care Progress Note, Assessment & Plan   Patient Name: Tammy Arias       Date: 11/04/2024 DOB: January 21, 1956  Age: 68 y.o. MRN#: [id number] Attending Physician: Isadora Hose, MD Primary Care Physician: Valora Lynwood FALCON, MD Admit Date: 10/27/2024  Subjective: Denies pain. Eating meals. Slept well last night. Urinary retention overnight requiring foley. Denies pain.   HPI: Per previous HPI- 68 y.o.female with medical problems of heart failure with reduced ejection fraction 25 to 30%, severe mitral regurgitation, moderate right ventricular systolic dysfunction, severe tricuspid regurgitation, atrial fibrillation, failed Watchman device, chronic anticoagulation with apixaban , GI bleed, coronary disease status post CABG in 2005, aortic stenosis repair, pulmonary hypertension, type 2 diabetes, hypertension, liver cirrhosis with grade 2 esophageal varices, C. 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    Palliative medicine consulted for assistance with goals of care conversations.   Summary of counseling/coordination of care: Extensive chart review completed prior to meeting patient including labs, vital signs, imaging, progress notes, orders, and available advanced directive documents from current and previous encounters.   After reviewing the patient's chart and assessing the patient at bedside, I spoke with patient in regards to symptom management and goals of care.      Latest Ref Rng & Units 11/04/2024    4:33 AM 11/03/2024    5:10 PM 11/03/2024   10:49 AM  CBC  WBC 4.0 - 10.5 K/uL 6.1     Hemoglobin 12.0 - 15.0 g/dL 7.8  7.6  8.5   Hematocrit 36.0 - 46.0 % 24.0  22.9  26.7   Platelets 150 - 400 K/uL 63         Latest Ref Rng & Units 11/04/2024     4:33 AM 11/03/2024    5:55 AM 11/02/2024    4:38 AM  CMP  Glucose 70 - 99 mg/dL 858  855  846   BUN 8 - 23 mg/dL 24  23  19    Creatinine 0.44 - 1.00 mg/dL 8.74  8.56  8.47   Sodium 135 - 145 mmol/L 132  128  128   Potassium 3.5 - 5.1 mmol/L 4.0  4.7  4.9   Chloride 98 - 111 mmol/L 97  95  96   CO2 22 - 32 mmol/L 26  25  25    Calcium  8.9 - 10.3 mg/dL 8.3  7.4  7.3     Elderly female sitting upright in bed. She is A&O. She participates in conversation making her wishes known. Respirations are even and unlabored. She is in no distress. Renal function improving. Nephrology plans for temp cath removal today.  Still requiring low dose pressor support.  Patient shares that she is hopeful she can go to the medical floor soon so she will be closer to going home. She continues to share that she does not want to go to SNF and only wants HH.   Therapeutic silence and active listening provided for patient to share her thoughts and emotions regarding current medical situation.  Emotional support provided.  Physical Exam Vitals reviewed.  Constitutional:      General: She is not in acute distress.    Appearance:  She is not ill-appearing.  HENT:     Head: Normocephalic and atraumatic.     Mouth/Throat:     Mouth: Mucous membranes are moist.  Pulmonary:     Effort: Pulmonary effort is normal. No respiratory distress.  Musculoskeletal:     Right lower leg: Edema present.     Left lower leg: Edema present.  Skin:    General: Skin is warm and dry.  Neurological:     Mental Status: She is alert and oriented to person, place, and time.  Psychiatric:        Mood and Affect: Mood normal.        Behavior: Behavior normal.        Thought Content: Thought content normal.        Judgment: Judgment normal.        Recommendations/Plan: DNR/DNI Continue current supportive interventions Patient prefers to d/c home with [facility]- does not want SNF PMT will continue to follow peripherally for needs          Total Time 50 minutes   Time spent includes: Detailed review of medical records (labs, imaging, vital signs), medically appropriate exam (mental status, respiratory, cardiac, skin), discussed with treatment team, counseling and educating patient, family and staff, documenting clinical information, medication management and coordination of care.     Devere Sacks, ELNITA- [facility] Palliative Medicine Team  11/04/2024 9:03 AM  Office [phone]  Pager [phone]

## 2024-11-04 NOTE — Progress Notes (Signed)
 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         She reports to ED for generalized malaise and is being admitted for hypotension  +GIB from ileostomy    PT Comments  Pt seen for PT tx with pt agreeable. Pt is able to complete transfers with supervision, ambulate around ICU with RW & supervision with slow, steady gait speed. Provided education re: need for mobility upon return home. Pt eager to return home. Will continue to follow pt acutely to progress mobility as able.   If plan is discharge home, recommend the following: Assistance with cooking/housework;Assist for transportation;Help with stairs or ramp for entrance;A little help with walking and/or transfers;A little help with bathing/dressing/bathroom   Can travel by private vehicle        Equipment Recommendations       Recommendations for Other Services       Precautions / Restrictions Precautions Precautions: Fall Restrictions Weight Bearing Restrictions Per Provider Order: No     Mobility  Bed Mobility                    Transfers Overall transfer level: Needs assistance Equipment used: Rolling walker (2 wheels) Transfers: Sit to/from Stand Sit to Stand: Supervision           General transfer comment: sit<>stand from recliner with RW     Ambulation/Gait Ambulation/Gait assistance: Supervision Gait Distance (Feet): 200 Feet Assistive device: Rolling walker (2 wheels) Gait Pattern/deviations: Decreased step length - right, Decreased step length - left, Decreased stride length Gait velocity: decreased     General Gait Details: pushes RW slightly out in front of her, no overt LOB   Stairs             Wheelchair Mobility     Tilt Bed    Modified Rankin (Stroke Patients Only)       Balance Overall balance assessment: Needs assistance Sitting-balance support: Feet supported Sitting balance-Leahy Scale: Normal     Standing balance support: Bilateral upper extremity supported, Reliant on assistive device for balance Standing balance-Leahy Scale: Good                              Communication Communication Communication: No apparent difficulties  Cognition Arousal: Alert Behavior During Therapy: WFL for tasks assessed/performed   PT - Cognitive impairments: No apparent impairments                         Following commands: Intact      Cueing Cueing Techniques: Verbal cues  Exercises Other Exercises Other Exercises: Educated pt on use of walker bag to transport items, BLE exercises & elevating BLE to assist with edema management, encouraged to ambulate every hour upon return home, do not progress to less restrictive AD until cleared by HHPT.    General Comments General comments (skin integrity, edema, etc.): BP in  RUE sitting after gait: 111/83 mmHg MAP 90      Pertinent Vitals/Pain Pain Assessment Pain Assessment: No/denies pain    Home Living                          Prior Function            PT Goals (current goals can now be found in the care plan section) Acute Rehab PT Goals Patient Stated Goal: Go home (and not rehab) PT Goal Formulation: With patient Time For Goal Achievement: 11/12/24 Potential to Achieve Goals: Good Progress towards PT  goals: Progressing toward goals    Frequency    Min 2X/week      PT Plan      Co-evaluation              AM-PAC PT 6 Clicks Mobility   Outcome Measure  Help needed turning from your back to your side while in a flat bed without using bedrails?: None Help needed moving from lying on your back to sitting on the side of a flat bed without using bedrails?: None Help needed moving to and from a bed to a chair (including a wheelchair)?: A Little Help needed standing up from a chair using your arms (e.g., wheelchair or bedside chair)?: A Little Help needed to walk in hospital room?: A Little Help needed climbing 3-5 steps with a railing? : A Little 6 Click Score: 20    End of Session   Activity Tolerance: Patient tolerated treatment well Patient left: in chair;with call bell/phone within reach   PT Visit Diagnosis: Muscle weakness (generalized) (M62.81);Difficulty in walking, not elsewhere classified (R26.2)     Time: 8592-8579 PT Time Calculation (min) (ACUTE ONLY): 13 min  Charges:    $Therapeutic Activity: 8-22 mins PT General Charges $$ ACUTE PT VISIT: 1 Visit                     Richerd Pinal, PT, DPT 11/04/24, 2:28 PM   Richerd CHRISTELLA Pinal 11/04/2024, 2:26 PM

## 2024-11-04 NOTE — Telephone Encounter (Signed)
 Pt in hospital and called to r/s appts from tomorrow out a couple of weeks. Lab/MD r/s to Dec. 31 and times confirmed with pt.

## 2024-11-04 NOTE — Progress Notes (Signed)
 Occupational Therapy Treatment Patient Details Name: Tammy Arias MRN: [id number] DOB: 11-Mar-1956 Today's Date: 11/04/2024   History of present illness 68 y.o.female with medical problems of heart failure with reduced ejection fraction 25 to 30%, severe mitral regurgitation, moderate right ventricular systolic dysfunction, severe tricuspid regurgitation, atrial fibrillation, failed Watchman device, chronic anticoagulation with apixaban , GI bleed, coronary disease status post CABG in 2005, aortic stenosis repair, pulmonary hypertension, type 2 diabetes, hypertension, liver cirrhosis with grade 2 esophageal varices, C. 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         She reports to ED for generalized malaise and is being admitted for hypotension  +GIB from ileostomy   OT comments  Tammy Arias was seen for OT treatment on this date. Upon arrival to room pt in room, agreeable to tx. Pt requires SBA + RW for ADL t/f ~100 ft. MOD A don B socks in sitting - assist 2/2 BLE edema.  Educated on ECS and HEP. Pt making good progress toward goals, will continue to follow POC. Discharge recommendation remains appropriate.       If plan is discharge home, recommend the following:  Help with stairs or ramp for entrance   Equipment Recommendations  None recommended by OT    Recommendations for Other Services      Precautions / Restrictions Precautions Precautions: Fall Recall of Precautions/Restrictions: Intact Restrictions Weight Bearing Restrictions Per Provider Order: No       Mobility Bed Mobility Overal bed mobility: Modified Independent             General bed mobility comments: rails use    Transfers Overall transfer level: Needs assistance Equipment used: Rolling walker (2 wheels) Transfers: Sit to/from Stand Sit to Stand: Supervision                 Balance Overall balance assessment: Needs assistance Sitting-balance  support: Feet supported Sitting balance-Leahy Scale: Normal     Standing balance support: No upper extremity supported, During functional activity Standing balance-Leahy Scale: Fair                             ADL either performed or assessed with clinical judgement   ADL Overall ADL's : Needs assistance/impaired                                       General ADL Comments: SBA + RW for ADL t/f ~100 ft. MOD A don B socks in sitting - assist 2/2 BLE edema.        Communication Communication Communication: No apparent difficulties   Cognition Arousal: Alert Behavior During Therapy: WFL for tasks assessed/performed Cognition: No apparent impairments                               Following commands: Intact                      Pertinent Vitals/ Pain       Pain Assessment Pain Assessment: No/denies pain   Frequency  Min 2X/week        Progress Toward Goals  OT Goals(current goals can now be found in the care plan section)  Progress towards OT goals: Progressing toward goals  Acute Rehab OT Goals  OT Goal Formulation: With patient Time For Goal Achievement: 11/13/24 Potential to Achieve Goals: Fair ADL Goals Pt Will Perform Grooming: with supervision;standing Pt Will Perform Lower Body Dressing: with supervision;sit to/from stand Pt Will Transfer to Toilet: with supervision;ambulating Pt Will Perform Toileting - Clothing Manipulation and hygiene: with supervision;sit to/from stand  Plan      Co-evaluation                 AM-PAC OT 6 Clicks Daily Activity     Outcome Measure   Help from another person eating meals?: None Help from another person taking care of personal grooming?: A Little Help from another person toileting, which includes using toliet, bedpan, or urinal?: A Little Help from another person bathing (including washing, rinsing, drying)?: A Little Help from another person to put on and  taking off regular upper body clothing?: None Help from another person to put on and taking off regular lower body clothing?: A Little 6 Click Score: 20    End of Session Equipment Utilized During Treatment: Rolling walker (2 wheels)  OT Visit Diagnosis: Unsteadiness on feet (R26.81);Repeated falls (R29.6);Muscle weakness (generalized) (M62.81)   Activity Tolerance Patient tolerated treatment well   Patient Left in chair;with call bell/phone within reach   Nurse Communication Mobility status        Time: 8677-8656 OT Time Calculation (min): 21 min  Charges: OT General Charges $OT Visit: 1 Visit OT Treatments $Self Care/Home Management : 8-22 mins  Elston Slot, M.S. OTR/L  11/04/24, 2:15 PM  ascom [phone]

## 2024-11-04 NOTE — Inpatient Diabetes Management (Signed)
 Inpatient Diabetes Program Recommendations  AACE/ADA: New Consensus Statement on Inpatient Glycemic Control  Target Ranges:  Prepandial:   less than 140 mg/dL      Peak postprandial:   less than 180 mg/dL (1-2 hours)      Critically ill patients:  140 - 180 mg/dL    Latest Reference Range & Units 11/03/24 08:00 11/03/24 11:30 11/03/24 16:39 11/03/24 21:22 11/04/24 07:49 11/04/24 11:28  Glucose-Capillary 70 - 99 mg/dL 879 (H) 749 (H) 747 (H) 219 (H) 145 (H) 302 (H)    Review of Glycemic Control  Current orders for Inpatient glycemic control: Semglee  7 units daily, Novolog  0-20 units TID with meals, Novolog  0-5 units at bedtime; Solucortef 50 mg Q8H  Inpatient Diabetes Program Recommendations:    Insulin : If steroids are continued, may want to consider ordering Novolog  3 units TID with meals for meal coverage if patient eats at least 50% of meals.  Thanks, Earnie Gainer, RN, MSN, CDCES Diabetes Coordinator Inpatient Diabetes Program [phone] (Team Pager from 8am to 5pm)

## 2024-11-05 ENCOUNTER — Ambulatory Visit: Admitting: Oncology

## 2024-11-05 ENCOUNTER — Inpatient Hospital Stay

## 2024-11-05 LAB — BASIC METABOLIC PANEL WITH GFR
Anion gap: 11 (ref 5–15)
BUN: 27 mg/dL — ABNORMAL HIGH (ref 8–23)
CO2: 27 mmol/L (ref 22–32)
Calcium: 9.1 mg/dL (ref 8.9–10.3)
Chloride: 97 mmol/L — ABNORMAL LOW (ref 98–111)
Creatinine, Ser: 1.23 mg/dL — ABNORMAL HIGH (ref 0.44–1.00)
GFR, Estimated: 47 mL/min — ABNORMAL LOW (ref >60)
Glucose, Bld: 87 mg/dL (ref 70–99)
Potassium: 4.2 mmol/L (ref 3.5–5.1)
Sodium: 134 mmol/L — ABNORMAL LOW (ref 135–145)

## 2024-11-05 LAB — CBC
HCT: 29.3 % — ABNORMAL LOW (ref 36.0–46.0)
Hemoglobin: 9.6 g/dL — ABNORMAL LOW (ref 12.0–15.0)
MCH: 32 pg (ref 26.0–34.0)
MCHC: 32.8 g/dL (ref 30.0–36.0)
MCV: 97.7 fL (ref 80.0–100.0)
Platelets: 59 K/uL — ABNORMAL LOW (ref 150–400)
RBC: 3 MIL/uL — ABNORMAL LOW (ref 3.87–5.11)
RDW: 23.9 % — ABNORMAL HIGH (ref 11.5–15.5)
WBC: 4.6 K/uL (ref 4.0–10.5)
nRBC: 0 % (ref 0.0–0.2)

## 2024-11-05 LAB — MAGNESIUM: Magnesium: 2.2 mg/dL (ref 1.7–2.4)

## 2024-11-05 LAB — GLUCOSE, CAPILLARY
Glucose-Capillary: 84 mg/dL (ref 70–99)
Glucose-Capillary: 65 mg/dL — ABNORMAL LOW (ref 70–99)
Glucose-Capillary: 100 mg/dL — ABNORMAL HIGH (ref 70–99)
Glucose-Capillary: 64 mg/dL — ABNORMAL LOW (ref 70–99)
Glucose-Capillary: 257 mg/dL — ABNORMAL HIGH (ref 70–99)
Glucose-Capillary: 252 mg/dL — ABNORMAL HIGH (ref 70–99)
Glucose-Capillary: 212 mg/dL — ABNORMAL HIGH (ref 70–99)
Glucose-Capillary: 255 mg/dL — ABNORMAL HIGH (ref 70–99)

## 2024-11-05 LAB — TSH: TSH: 1.79 u[IU]/mL (ref 0.350–4.500)

## 2024-11-05 LAB — PHOSPHORUS: Phosphorus: 2.5 mg/dL (ref 2.5–4.6)

## 2024-11-05 MED ORDER — INSULIN ASPART 100 UNIT/ML IJ SOLN
0.0000 [IU] | Freq: Three times a day (TID) | INTRAMUSCULAR | Status: DC
  Administered 2024-11-05 (×2): 8 [IU] via SUBCUTANEOUS
  Administered 2024-11-06: 5 [IU] via SUBCUTANEOUS
  Administered 2024-11-06: 8 [IU] via SUBCUTANEOUS
  Administered 2024-11-06: 2 [IU] via SUBCUTANEOUS
  Administered 2024-11-07 (×2): 5 [IU] via SUBCUTANEOUS
  Administered 2024-11-07: 3 [IU] via SUBCUTANEOUS
  Administered 2024-11-08: 8 [IU] via SUBCUTANEOUS
  Administered 2024-11-08: 11 [IU] via SUBCUTANEOUS
  Administered 2024-11-08: 3 [IU] via SUBCUTANEOUS
  Administered 2024-11-09 (×2): 11 [IU] via SUBCUTANEOUS
  Administered 2024-11-09 – 2024-11-11 (×4): 5 [IU] via SUBCUTANEOUS
  Administered 2024-11-11: 09:00:00 3 [IU] via SUBCUTANEOUS
  Administered 2024-11-11: 13:00:00 2 [IU] via SUBCUTANEOUS
  Administered 2024-11-12: 13:00:00 5 [IU] via SUBCUTANEOUS
  Administered 2024-11-12 – 2024-11-13 (×2): 2 [IU] via SUBCUTANEOUS
  Administered 2024-11-13 (×2): 3 [IU] via SUBCUTANEOUS
  Administered 2024-11-14: 13:00:00 5 [IU] via SUBCUTANEOUS
  Administered 2024-11-14 (×2): 3 [IU] via SUBCUTANEOUS
  Administered 2024-11-15 (×2): 5 [IU] via SUBCUTANEOUS
  Administered 2024-11-15 – 2024-11-16 (×2): 3 [IU] via SUBCUTANEOUS
  Administered 2024-11-16: 2 [IU] via SUBCUTANEOUS
  Administered 2024-11-16 – 2024-11-17 (×3): 3 [IU] via SUBCUTANEOUS
  Administered 2024-11-17: 2 [IU] via SUBCUTANEOUS
  Filled 2024-11-05: qty 3
  Filled 2024-11-05 (×2): qty 5
  Filled 2024-11-05: qty 11
  Filled 2024-11-05 (×2): qty 2
  Filled 2024-11-05: qty 3
  Filled 2024-11-05: qty 2
  Filled 2024-11-05: qty 11
  Filled 2024-11-05 (×3): qty 5
  Filled 2024-11-05: qty 8
  Filled 2024-11-05: qty 3
  Filled 2024-11-05: qty 5
  Filled 2024-11-05: qty 3
  Filled 2024-11-05: qty 2
  Filled 2024-11-05: qty 5
  Filled 2024-11-05: qty 8
  Filled 2024-11-05 (×3): qty 3
  Filled 2024-11-05 (×2): qty 2
  Filled 2024-11-05: qty 8
  Filled 2024-11-05 (×2): qty 3
  Filled 2024-11-05: qty 8
  Filled 2024-11-05: qty 5
  Filled 2024-11-05 (×2): qty 3
  Filled 2024-11-05 (×2): qty 5
  Filled 2024-11-05: qty 11
  Filled 2024-11-05: qty 5
  Filled 2024-11-05: qty 3

## 2024-11-05 MED ORDER — INSULIN GLARGINE-YFGN 100 UNIT/ML ~~LOC~~ SOLN
5.0000 [IU] | Freq: Two times a day (BID) | SUBCUTANEOUS | Status: DC
  Filled 2024-11-05: qty 0.05

## 2024-11-05 MED ORDER — ROSUVASTATIN CALCIUM 10 MG PO TABS
5.0000 mg | ORAL_TABLET | Freq: Every day | ORAL | Status: DC
  Administered 2024-11-05 – 2024-11-18 (×14): 5 mg via ORAL
  Filled 2024-11-05 (×14): qty 1

## 2024-11-05 MED ORDER — INSULIN ASPART 100 UNIT/ML IJ SOLN
0.0000 [IU] | Freq: Every day | INTRAMUSCULAR | Status: DC
  Administered 2024-11-05: 2 [IU] via SUBCUTANEOUS
  Administered 2024-11-06 – 2024-11-12 (×3): 3 [IU] via SUBCUTANEOUS
  Administered 2024-11-15 – 2024-11-17 (×2): 2 [IU] via SUBCUTANEOUS
  Filled 2024-11-05: qty 2
  Filled 2024-11-05 (×3): qty 3
  Filled 2024-11-05 (×2): qty 2
  Filled 2024-11-05: qty 3

## 2024-11-05 MED ORDER — INSULIN ASPART 100 UNIT/ML IJ SOLN: 3.0000 [IU] | INTRAMUSCULAR | Status: DC

## 2024-11-05 MED ORDER — HYDROCORTISONE SOD SUC (PF) 100 MG IJ SOLR
50.0000 mg | Freq: Two times a day (BID) | INTRAMUSCULAR | Status: DC
  Administered 2024-11-05 – 2024-11-07 (×4): 50 mg via INTRAVENOUS
  Filled 2024-11-05 (×4): qty 2

## 2024-11-05 MED ORDER — INSULIN GLARGINE 100 UNIT/ML ~~LOC~~ SOLN
7.0000 [IU] | Freq: Every day | SUBCUTANEOUS | Status: DC
  Administered 2024-11-05 – 2024-11-06 (×2): 7 [IU] via SUBCUTANEOUS
  Filled 2024-11-05 (×3): qty 0.07

## 2024-11-05 MED ORDER — ALLOPURINOL 300 MG PO TABS
300.0000 mg | ORAL_TABLET | Freq: Every day | ORAL | Status: DC
  Administered 2024-11-05 – 2024-11-18 (×14): 300 mg via ORAL
  Filled 2024-11-05: qty 1
  Filled 2024-11-05 (×5): qty 3
  Filled 2024-11-05: qty 1
  Filled 2024-11-05 (×3): qty 3
  Filled 2024-11-05: qty 1
  Filled 2024-11-05 (×3): qty 3

## 2024-11-05 MED ORDER — INSULIN ASPART 100 UNIT/ML IJ SOLN
5.0000 [IU] | INTRAMUSCULAR | Status: DC
  Administered 2024-11-05 (×2): 5 [IU] via SUBCUTANEOUS
  Filled 2024-11-05: qty 5

## 2024-11-05 NOTE — Plan of Care (Signed)

## 2024-11-05 NOTE — Consult Note (Signed)
 PHARMACY CONSULT NOTE  Pharmacy Consult for Electrolyte Monitoring and Replacement   Recent Labs: Potassium (mmol/L)  Date Value  11/05/2024 4.2   Magnesium  (mg/dL)  Date Value  87/90/7974 2.2   Calcium  (mg/dL)  Date Value  87/90/7974 9.1   Albumin  (g/dL)  Date Value  87/93/7974 3.4 (L)  05/18/2020 4.7   Phosphorus (mg/dL)  Date Value  87/90/7974 2.5   Sodium (mmol/L)  Date Value  11/05/2024 134 (L)   Assessment: 68 y/o female with h/o ischemic cardiomyopathy, gastric AVM, GI angiodysplasia, IBS-D, IDA, s/p watchman device, cholelithiasis, TIA, hypothyroidism, pulmonary hypertension, GERD, barrett's esophagus, HLD, HTN, OSA, CAD s/p CABG x 3, PAF, CHF, cirrhosis, portal hypertension, gout, CKD III, B12 deficiency, ventral hernia s/p mesh repair 2018, colonic angioectasias, diverticulosis, DDD, hiatal hernia, esophageal varices. Pharmacy is asked to follow and replace electrolytes while in CCU  Diuretics: furosemide  40 mg IV BID  Goal of Therapy:  Electrolytes WNL  Plan:  No additional replacement needed at this time.  Because this consult was generated as part of an ICU order set and patient is transferring pharmacy will sign off for now Please feel free to reach out if any further assistance is needed  Adriana JONETTA Bolster, PharmD, BCPS Clinical Pharmacist 11/05/2024 7:17 AM

## 2024-11-05 NOTE — Consult Note (Signed)
 Consult for established colostomy, supplies appliance lawson numbers placed in ostomy care orders for nursing to follow. Nursing staff should follow step by step instructions. Secure Chat primary nurse. Recommend that a convex 1-piece be placed to reduce possible peristomal issues.    WOC will not follow and will remove patient from census task list.  Please reconsult if ostomy worsens in condition and notify provider.   Sherrilyn Hals MSN RN CWOCN WOC [facility]  [phone] (Available from 7-3 pm Mon-Friday)

## 2024-11-05 NOTE — Inpatient Diabetes Management (Signed)
 Inpatient Diabetes Program Recommendations  AACE/ADA: New Consensus Statement on Inpatient Glycemic Control   Target Ranges:  Prepandial:   less than 140 mg/dL      Peak postprandial:   less than 180 mg/dL (1-2 hours)      Critically ill patients:  140 - 180 mg/dL    Latest Reference Range & Units 11/04/24 07:49 11/04/24 11:28 11/04/24 16:39 11/04/24 19:39 11/04/24 23:42 11/05/24 03:29 11/05/24 06:02 11/05/24 06:32  Glucose-Capillary 70 - 99 mg/dL 854 (H)  Novolog  3 units  Semglee  7 units @10 :21 302 (H)  Novolog  15 units 296 (H)  Novolog  11 units 316 (H)  Novolog  25 units  Semglee  7 units @23 :04 219 (H)  Novolog  12 units 84  Novolog  5 units 65 (L) 64 (L)   Review of Glycemic Control  Current orders for Inpatient glycemic control: Semglee  7 units at bedtime, Novolog  0-20 units Q4H, Novolog  3 units Q4H; Solucortef 50 mg Q8H  Inpatient Diabetes Program Recommendations:    Insulin : CBG 64 mg/dl at 3:67 am today due to getting scheduled Novolog  Q4H but no tube feedings ordered.  If Solucortef is continued as ordered, please change Semglee  to 7 units BID and change Novolog  3 units Q4H to Novolog  3 units TID with meals if patient eats at least 50% of meals.  NOTE: Patient is currently ordered Carb Modified diet. Noted patient has Novolog  3 units Q4H ordered but no tube feedings ordered. Patient is ordered Solucortef which is contributing to hyperglycemia. Patient received Semglee  7 units x2 on 12/8 (given at 10:21 and 23:04).   Thanks, Earnie Gainer, RN, MSN, CDCES Diabetes Coordinator Inpatient Diabetes Program [phone] (Team Pager from 8am to 5pm)

## 2024-11-05 NOTE — Progress Notes (Signed)
 If needing ileostomy supplies this ordering information, this is also in the orders----- 1-piece convex [id number], barrier ring [phone]

## 2024-11-05 NOTE — Progress Notes (Addendum)
 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 She reports to ED for generalized malaise and is being admitted for Hemorrhagic shock (HCC) [R57.8] Shock (HCC) [R57.9] Acute GI bleeding [K92.2] Acute renal failure, unspecified acute renal failure type [N17.9]  Patient is known to our practice from previous admissions and has seen Dr Dennise in he office once.   Update: Patient sitting up in bed Remains in good spirits Room air  Urine output  Objective:  Vital signs in last 24 hours:  Temp:  [97.7 F (36.5 C)-98.2 F (36.8 C)] 97.9 F (36.6 C) (12/09 0800) Pulse Rate:  [41-137] 126 (12/09 0930) Resp:  [11-36] 16 (12/09 0845) BP: (62-132)/(40-87) 106/52 (12/09 0930) SpO2:  [96 %-100 %] 99 % (12/09 0930) Weight:  [72 kg] 72 kg (12/09 0705)  Weight change:  Filed Weights   11/01/24 0500 11/04/24 0500 11/05/24 0705  Weight: 73.3 kg 69.1 kg 72 kg    Intake/Output: I/O last 3 completed shifts: In: 2025 [P.O.:954; I.V.:241.9; Blood:699; IV Piggyback:130.1] Out: 6787 [Urine:5162; Stool:1625]   Intake/Output this shift:  Total I/O In: 126 [P.O.:120; I.V.:6] Out: 150 [Urine:100; Stool:50]  Physical Exam: General: NAD  Head: Normocephalic, atraumatic. Moist oral mucosa  Eyes: Anicteric  Lungs:  Faint wheeze, normal effort  Heart: Regular rate and rhythm  Abdomen:  Soft, nontender. Ileostomy    Extremities:  2+ peripheral edema.  Neurologic: Awake, alert, conversant  Skin: Warm,dry, no rash  Access: None    Basic Metabolic Panel: Recent Labs  Lab 11/01/24 0534 11/02/24 0438 11/03/24 0555 11/04/24 0433 11/05/24 0330  NA 126* 128* 128* 132* 134*  K 5.0 4.9 4.7 4.0 4.2  CL 95* 96* 95* 97* 97*  CO2 27 25 25 26 27   GLUCOSE 134* 153* 144* 141* 87  BUN 16 19 23  24* 27*  CREATININE 1.56* 1.52* 1.43* 1.25* 1.23*  CALCIUM  7.2* 7.3* 7.4* 8.3* 9.1  MG 1.7 1.9 1.9 1.5* 2.2  PHOS 1.7* 1.9* 2.0* 2.8 2.5    Liver Function Tests: Recent Labs  Lab 10/29/24 1556 10/30/24 0355 10/31/24 0419 11/01/24 0534 11/02/24 0438  ALBUMIN  3.4* 3.6 3.3* 3.3* 3.4*   No results for input(s): LIPASE, AMYLASE in the last 168 hours.  No results for input(s): AMMONIA in the last 168 hours.  CBC: Recent Labs  Lab 10/30/24 0355 10/30/24 1100 11/01/24 0534 11/01/24 1118 11/02/24 0438 11/02/24 1701 11/03/24 0555 11/03/24 1049 11/03/24 1710 11/04/24 0433 11/04/24 1852 11/05/24 0330  WBC 3.6*   < > 4.7  --  6.0  --  6.1  --   --  6.1  --  4.6  NEUTROABS 2.3  --   --   --   --   --   --   --   --   --   --   --   HGB 7.1*   < > 7.4*   < > 8.1*   < > 7.8* 8.5* 7.6* 7.8* 9.4* 9.6*  HCT 20.9*   < > 22.5*   < >  24.7*   < > 23.9* 26.7* 22.9* 24.0* 28.6* 29.3*  MCV 95.0   < > 93.8  --  95.0  --  96.8  --   --  97.6  --  97.7  PLT 40*   < > 39*  --  49*  --  52*  --   --  63*  --  59*   < > = values in this interval not displayed.    Cardiac Enzymes: No results for input(s): CKTOTAL, CKMB, CKMBINDEX, TROPONINI in the last 168 hours.  BNP: Invalid input(s): POCBNP  CBG: Recent Labs  Lab 11/04/24 2342 11/05/24 0329 11/05/24 0602 11/05/24 0632 11/05/24 0722  GLUCAP 219* 84 65* 64* 100*    Microbiology: Results for orders placed or performed during the hospital encounter of 10/27/24  Resp panel by RT-PCR (RSV, Flu A&B, Covid) Anterior Nasal Swab     Status: None    Collection Time: 10/27/24 11:01 AM   Specimen: Anterior Nasal Swab  Result Value Ref Range Status   SARS Coronavirus 2 by RT PCR NEGATIVE NEGATIVE Final    Comment: (NOTE) SARS-CoV-2 target nucleic acids are NOT DETECTED.  The SARS-CoV-2 RNA is generally detectable in upper respiratory specimens during the acute phase of infection. The lowest concentration of SARS-CoV-2 viral copies this assay can detect is 138 copies/mL. A negative result does not preclude SARS-Cov-2 infection and should not be used as the sole basis for treatment or other patient management decisions. A negative result may occur with  improper specimen collection/handling, submission of specimen other than nasopharyngeal swab, presence of viral mutation(s) within the areas targeted by this assay, and inadequate number of viral copies(<138 copies/mL). A negative result must be combined with clinical observations, patient history, and epidemiological information. The expected result is Negative.  Fact Sheet for Patients:  bloggercourse.com  Fact Sheet for Healthcare Providers:  seriousbroker.it  This test is no t yet approved or cleared by the [place]  FDA and  has been authorized for detection and/or diagnosis of SARS-CoV-2 by FDA under an Emergency Use Authorization (EUA). This EUA will remain  in effect (meaning this test can be used) for the duration of the COVID-19 declaration under Section 564(b)(1) of the Act, 21 U.S.C.section 360bbb-3(b)(1), unless the authorization is terminated  or revoked sooner.       Influenza A by PCR NEGATIVE NEGATIVE Final   Influenza B by PCR NEGATIVE NEGATIVE Final    Comment: (NOTE) The Xpert Xpress SARS-CoV-2/FLU/RSV plus assay is intended as an aid in the diagnosis of influenza from Nasopharyngeal swab specimens and should not be used as a sole basis for treatment. Nasal washings and aspirates are unacceptable for  Xpert Xpress SARS-CoV-2/FLU/RSV testing.  Fact Sheet for Patients: bloggercourse.com  Fact Sheet for Healthcare Providers: seriousbroker.it  This test is not yet approved or cleared by the [place]  FDA and has been authorized for detection and/or diagnosis of SARS-CoV-2 by FDA under an Emergency Use Authorization (EUA). This EUA will remain in effect (meaning this test can be used) for the duration of the COVID-19 declaration under Section 564(b)(1) of the Act, 21 U.S.C. section 360bbb-3(b)(1), unless the authorization is terminated or revoked.     Resp Syncytial Virus by PCR NEGATIVE NEGATIVE Final    Comment: (NOTE) Fact Sheet for Patients: bloggercourse.com  Fact Sheet for Healthcare Providers: seriousbroker.it  This test is not yet approved or cleared by the [place]  FDA and has been authorized for detection and/or diagnosis of SARS-CoV-2 by  FDA under an Emergency Use Authorization (EUA). This EUA will remain in effect (meaning this test can be used) for the duration of the COVID-19 declaration under Section 564(b)(1) of the Act, 21 U.S.C. section 360bbb-3(b)(1), unless the authorization is terminated or revoked.  Performed at [facility], [address] [place], [place] [address]   Blood Culture (routine x 2)     Status: None   Collection Time: 10/27/24 12:18 PM   Specimen: Right Antecubital; Blood  Result Value Ref Range Status   Specimen Description RIGHT ANTECUBITAL  Final   Special Requests   Final    BOTTLES DRAWN AEROBIC AND ANAEROBIC Blood Culture adequate volume   Culture   Final    NO GROWTH 5 DAYS Performed at [facility], [address] [place], [place] [address]    Report Status 11/01/2024 FINAL  Final  Blood Culture (routine x 2)     Status: None   Collection Time: 10/27/24 12:18 PM   Specimen: Left Antecubital; Blood   Result Value Ref Range Status   Specimen Description LEFT ANTECUBITAL  Final   Special Requests   Final    BOTTLES DRAWN AEROBIC AND ANAEROBIC Blood Culture results may not be optimal due to an inadequate volume of blood received in culture bottles   Culture   Final    NO GROWTH 5 DAYS Performed at [facility], [address], [place], [place] [address]    Report Status 11/01/2024 FINAL  Final  MRSA Next Gen by PCR, Nasal     Status: Abnormal   Collection Time: 10/27/24  1:38 PM   Specimen: Nasal Mucosa; Nasal Swab  Result Value Ref Range Status   MRSA by PCR Next Gen DETECTED (A) NOT DETECTED Final    Comment: RESULT CALLED TO, READ BACK BY AND VERIFIED WITH: KATHERINE CLAYTON @1520  10/27/24 MJU (NOTE) The GeneXpert MRSA Assay (FDA approved for NASAL specimens only), is one component of a comprehensive MRSA colonization surveillance program. It is not intended to diagnose MRSA infection nor to guide or monitor treatment for MRSA infections. Test performance is not FDA approved in patients less than 22 years old. Performed at [facility], [address] [place], [place] [address]     Coagulation Studies: No results for input(s): LABPROT, INR in the last 72 hours.   Urinalysis: No results for input(s): COLORURINE, LABSPEC, PHURINE, GLUCOSEU, HGBUR, BILIRUBINUR, KETONESUR, PROTEINUR, UROBILINOGEN, NITRITE, LEUKOCYTESUR in the last 72 hours.  Invalid input(s): APPERANCEUR     Imaging: No results found.     Medications:      allopurinol   300 mg Oral Daily   alteplase   2 mg Intracatheter Once   Chlorhexidine  Gluconate Cloth  6 each Topical Nightly   feeding supplement (GLUCERNA SHAKE)  237 mL Oral TID BM   furosemide   40 mg Intravenous BID   hydrocortisone  sod succinate (SOLU-CORTEF ) inj  50 mg Intravenous Q12H   insulin  aspart  0-15 Units Subcutaneous TID WC   insulin  aspart  0-5 Units Subcutaneous QHS   insulin   glargine  7 Units Subcutaneous QHS   latanoprost   1 drop Both Eyes QHS   levothyroxine   50 mcg Oral QAC breakfast   midodrine   15 mg Oral TID WC   multivitamin  1 tablet Oral QHS   pantoprazole   40 mg Oral Q12H   phytonadione   2.5 mg Oral Daily   rosuvastatin   5 mg Oral Daily   sodium chloride  flush  3 mL Intravenous Q12H  acetaminophen , docusate sodium , ipratropium-albuterol , ondansetron  (ZOFRAN ) IV, mouth rinse, polyethylene glycol, sodium chloride  flush, traZODone   Assessment/ Plan:  Ms. Tammy Arias is a 68 y.o.  female presents to ED with weakness and has been admitted for Hemorrhagic shock (HCC) [R57.8] Shock (HCC) [R57.9] Acute GI bleeding [K92.2] Acute renal failure, unspecified acute renal failure type [N17.9]   Acute Kidney Injury with hyperkalemia, with baseline creatinine 0.92 on 08/22/24.  Acute kidney injury appears multifactorial at this time from hypotension and dehydration. Potassium 6.1 on admission. Corrected some with shifting measures. CRRT from 12/1 to 12/3.   Levo weaned off yesterday. Renal function stable. Adequate urine output noted. Continue prescribed furosemide  for now.   Lab Results  Component Value Date   CREATININE 1.23 (H) 11/05/2024   CREATININE 1.25 (H) 11/04/2024   CREATININE 1.43 (H) 11/03/2024    Intake/Output Summary (Last 24 hours) at 11/05/2024 1102 Last data filed at 11/05/2024 0912 Gross per 24 hour  Intake 1702.53 ml  Output 3200 ml  Net -1497.47 ml    2. Acute metabolic acidosis, S bicarb 9 on ED arrival. Resolved  3. Anemia with suspected acute blood loss Lab Results  Component Value Date   HGB 9.6 (L) 11/05/2024  Has received blood transfusions during this admission. GI has no plans to repeat EGD due to current and recent workups. Hgb slowly improving  4. Hypotension, hemorrhagic shock. Blood pressure stable, Levo off.   LOS: 9 Reiss Mowrey 12/9/202511:02 AM

## 2024-11-05 NOTE — Plan of Care (Signed)
  Problem: Education: Goal: Knowledge of General Education information will improve Description: Including pain rating scale, medication(s)/side effects and non-pharmacologic comfort measures Outcome: Progressing   Problem: Health Behavior/Discharge Planning: Goal: Ability to manage health-related needs will improve Outcome: Progressing   Problem: Clinical Measurements: Goal: Will remain free from infection Outcome: Progressing Goal: Respiratory complications will improve Outcome: Progressing Goal: Cardiovascular complication will be avoided Outcome: Progressing   Problem: Activity: Goal: Risk for activity intolerance will decrease Outcome: Progressing   Problem: Nutrition: Goal: Adequate nutrition will be maintained Outcome: Progressing   Problem: Elimination: Goal: Will not experience complications related to bowel motility Outcome: Progressing Goal: Will not experience complications related to urinary retention Outcome: Progressing   Problem: Pain Managment: Goal: General experience of comfort will improve and/or be controlled Outcome: Progressing   Problem: Safety: Goal: Ability to remain free from injury will improve Outcome: Progressing

## 2024-11-05 NOTE — Progress Notes (Signed)
 PROGRESS NOTE    Tammy Arias  FMW:[id number] DOB: [birth year] DOA: 10/27/2024 PCP: Valora Lynwood FALCON, MD     Brief Narrative:   68 y.o.female with medical problems of heart failure with reduced ejection fraction 25 to 30%, severe mitral regurgitation, moderate right ventricular systolic dysfunction, severe tricuspid regurgitation, atrial fibrillation, failed Watchman device, chronic anticoagulation with apixaban , GI bleed, coronary disease status post CABG in 2005, aortic stenosis repair, pulmonary hypertension, type 2 diabetes, hypertension, liver cirrhosis with grade 2 esophageal varices, C. 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      She reports to ED for generalized malaise and is being admitted for hypotension +GIB from ileostomy   She states she was in her normal state of health on Thanksgiving and was able to eat well. Started feeling bad on Friday. Was unable to eat on Saturday. States she has had a lot of bloody drainage from ileostomy.  had progressive weakness since yesterday.   11/30 admitted for hemorraghic shock, given 3-4 Liters of fluid, baseline SBP 90's 10/29/24- patient remains on vasopressors.  Platelet with significant drop overnight but appears to be chronic when trended over past 5 years.  10/30/24- patient remains critically ill, her renal function is improving, remains with electrolyte derrangements including low phos, low Na, low Ca, elevated glucose.  CBC with borderline low Hb, thrombocytopenia, eosinophilia. Blood cultures negative to date.  RRT is ongoing. S/p 1 unit prbc transfusion today.  10/31/24 - patient with severe electrolyte derrangements, hyponatremia and thrombocytopenia. Clinically improved but still critically ill on vasopressor support 11/01/24- patient restarted on levophed  overnight. No bleeding per ostomy. RIJ TLC +.   Plan to transition to TRH service. 11/02/24- patient required levophed   overnight and remains in ICU.  She is clinically not worse and h/h is trending up. Blood cultures negative. WBC count normal.  11/03/24- patient with component of cardiogenic shock.  She has extensive cardiac history and we have consulted cardiology for additional input. Her main reason for being in ICU is hemoragic shock which is resolved but now cardiac etiology is main culprit of illness.   11/04/24: weaned off vasopressors, but otherwise mental status is improved   Assessment & Plan:   Principal Problem:   Hemorrhagic shock (HCC) Active Problems:   Acute renal failure   Cirrhosis and Portal hypertension  on CT(HCC)   CAD with hx of CABG   Hypothyroidism   Chronic systolic CHF (congestive heart failure) (HCC) - LVEF 35 to 40%   Atrial fibrillation, chronic (HCC)   Ileostomy in place (HCC)   OSA on CPAP   Type 2 diabetes mellitus (HCC)   Esophageal varices without bleeding (HCC)   Shock (HCC)   Acute GI bleeding   Chronic kidney disease, stage 3b (HCC)  # Circulatory shock Multifactorial 2/2 chf, bleeding, possible adrenal insufficiency - continue midodrine   # Adrenal insufficiency? Has been treated for this in the past. Started on stress-dose steroids 12/8 - will decrease frequency of hydrocortisone  from q8 to q12 today  # HFrEF Recent tte ef 35-40, moderate MR (hx mitral valve repair), severe TR. Volume overloaded now after resuscitation - continue IV diuresis - home coreg , jardiance , nadolol on hold 2/2 hypotension  # GI bleeding # History AVMs # Esophageal varices History recurrent GI bleeds, thorough recent GI evaluation not much they can offer, current bleed thought to be 2/2 bleeding from ostomy now controlled. Received total of 3 units thus far this hospitalization. -  monitor  # AKI on ckd 3a Required crrt 12/1-12/3, kidney function now back to baseline - monitor - nephrology following  # A-fib Rate controlled, not anticoagulated 2/2 recurrent bleeds. S/p  watchman procedure  # Cirrhosis Small stable ascites on 12/1 ct - diurese as above  # Ileostomy status # History c diff colitis S/p colectomy for c diff fulminant colitis - standard ostomy care  # Hypothyroid - home levo - f/u tsh  # CAD Hx cabg. No chest pain - resume home statin  # OSA Declines cpap in hospital as says can only use nasal cushion  # Debility Refuses snf but agreeable to home health  # T2DM Hypoglycemic overnight - reduce frequency of ssi from q4 to ac and hs - continue glargine qhs  # Gout - resume home allopurinol      DVT prophylaxis: SCDs Code Status: dnr/dni Family Communication: none at bedside  Level of care: Progressive Status is: Inpatient Remains inpatient appropriate because: severity of illness    Consultants:  Nephrology, cardiology  Procedures: crrt  Antimicrobials:  none    Subjective: Reports feeling well. Legs still very swollen  Objective: Vitals:   11/05/24 0830 11/05/24 0845 11/05/24 0915 11/05/24 0930  BP: (!) 100/48  117/64 (!) 106/52  Pulse: 66 85 (!) 49 (!) 126  Resp: 18 16    Temp:      TempSrc:      SpO2: 100% 100% 100% 99%  Weight:      Height:        Intake/Output Summary (Last 24 hours) at 11/05/2024 1000 Last data filed at 11/05/2024 0912 Gross per 24 hour  Intake 1820.27 ml  Output 3200 ml  Net -1379.73 ml   Filed Weights   11/01/24 0500 11/04/24 0500 11/05/24 0705  Weight: 73.3 kg 69.1 kg 72 kg    Examination:  General exam: Appears calm and comfortable  Respiratory system: Clear to auscultation. Respiratory effort normal. Cardiovascular system: S1 & S2 heard, irreg irreg, murmur Gastrointestinal system: Abdomen is nondistended, soft and nontender.   Central nervous system: Alert and oriented. No focal neurological deficits. Extremities: Symmetric 5 x 5 power. Pitting edema to abdomen Skin: No rashes, lesions or ulcers Psychiatry: Judgement and insight appear normal. Mood &  affect appropriate.     Data Reviewed: I have personally reviewed following labs and imaging studies  CBC: Recent Labs  Lab 10/30/24 0355 10/30/24 1100 11/01/24 0534 11/01/24 1118 11/02/24 0438 11/02/24 1701 11/03/24 0555 11/03/24 1049 11/03/24 1710 11/04/24 0433 11/04/24 1852 11/05/24 0330  WBC 3.6*   < > 4.7  --  6.0  --  6.1  --   --  6.1  --  4.6  NEUTROABS 2.3  --   --   --   --   --   --   --   --   --   --   --   HGB 7.1*   < > 7.4*   < > 8.1*   < > 7.8* 8.5* 7.6* 7.8* 9.4* 9.6*  HCT 20.9*   < > 22.5*   < > 24.7*   < > 23.9* 26.7* 22.9* 24.0* 28.6* 29.3*  MCV 95.0   < > 93.8  --  95.0  --  96.8  --   --  97.6  --  97.7  PLT 40*   < > 39*  --  49*  --  52*  --   --  63*  --  59*   < > =  values in this interval not displayed.   Basic Metabolic Panel: Recent Labs  Lab 11/01/24 0534 11/02/24 0438 11/03/24 0555 11/04/24 0433 11/05/24 0330  NA 126* 128* 128* 132* 134*  K 5.0 4.9 4.7 4.0 4.2  CL 95* 96* 95* 97* 97*  CO2 27 25 25 26 27   GLUCOSE 134* 153* 144* 141* 87  BUN 16 19 23  24* 27*  CREATININE 1.56* 1.52* 1.43* 1.25* 1.23*  CALCIUM  7.2* 7.3* 7.4* 8.3* 9.1  MG 1.7 1.9 1.9 1.5* 2.2  PHOS 1.7* 1.9* 2.0* 2.8 2.5   GFR: Estimated Creatinine Clearance: 39.2 mL/min (A) (by C-G formula based on SCr of 1.23 mg/dL (H)). Liver Function Tests: Recent Labs  Lab 10/29/24 1556 10/30/24 0355 10/31/24 0419 11/01/24 0534 11/02/24 0438  ALBUMIN  3.4* 3.6 3.3* 3.3* 3.4*   No results for input(s): LIPASE, AMYLASE in the last 168 hours. No results for input(s): AMMONIA in the last 168 hours. Coagulation Profile: No results for input(s): INR, PROTIME in the last 168 hours. Cardiac Enzymes: No results for input(s): CKTOTAL, CKMB, CKMBINDEX, TROPONINI in the last 168 hours. BNP (last 3 results) Recent Labs    11/01/24 1946 11/03/24 1049  PROBNP 7,541.0* 6,351.0*   HbA1C: No results for input(s): HGBA1C in the last 72 hours. CBG: Recent Labs   Lab 11/04/24 2342 11/05/24 0329 11/05/24 0602 11/05/24 0632 11/05/24 0722  GLUCAP 219* 84 65* 64* 100*   Lipid Profile: No results for input(s): CHOL, HDL, LDLCALC, TRIG, CHOLHDL, LDLDIRECT in the last 72 hours. Thyroid  Function Tests: No results for input(s): TSH, T4TOTAL, FREET4, T3FREE, THYROIDAB in the last 72 hours. Anemia Panel: No results for input(s): VITAMINB12, FOLATE, FERRITIN, TIBC, IRON , RETICCTPCT in the last 72 hours. Urine analysis:    Component Value Date/Time   COLORURINE AMBER (A) 10/30/2024 0514   APPEARANCEUR CLOUDY (A) 10/30/2024 0514   LABSPEC 1.016 10/30/2024 0514   PHURINE 5.0 10/30/2024 0514   GLUCOSEU 150 (A) 10/30/2024 0514   HGBUR LARGE (A) 10/30/2024 0514   BILIRUBINUR NEGATIVE 10/30/2024 0514   KETONESUR NEGATIVE 10/30/2024 0514   PROTEINUR 100 (A) 10/30/2024 0514   NITRITE NEGATIVE 10/30/2024 0514   LEUKOCYTESUR LARGE (A) 10/30/2024 0514   Sepsis Labs: @LABRCNTIP (procalcitonin:4,lacticidven:4)  ) Recent Results (from the past 240 hours)  Resp panel by RT-PCR (RSV, Flu A&B, Covid) Anterior Nasal Swab     Status: None   Collection Time: 10/27/24 11:01 AM   Specimen: Anterior Nasal Swab  Result Value Ref Range Status   SARS Coronavirus 2 by RT PCR NEGATIVE NEGATIVE Final    Comment: (NOTE) SARS-CoV-2 target nucleic acids are NOT DETECTED.  The SARS-CoV-2 RNA is generally detectable in upper respiratory specimens during the acute phase of infection. The lowest concentration of SARS-CoV-2 viral copies this assay can detect is 138 copies/mL. A negative result does not preclude SARS-Cov-2 infection and should not be used as the sole basis for treatment or other patient management decisions. A negative result may occur with  improper specimen collection/handling, submission of specimen other than nasopharyngeal swab, presence of viral mutation(s) within the areas targeted by this assay, and inadequate number  of viral copies(<138 copies/mL). A negative result must be combined with clinical observations, patient history, and epidemiological information. The expected result is Negative.  Fact Sheet for Patients:  bloggercourse.com  Fact Sheet for Healthcare Providers:  seriousbroker.it  This test is no t yet approved or cleared by the [place]  FDA and  has been authorized for detection and/or diagnosis of SARS-CoV-2  by FDA under an Emergency Use Authorization (EUA). This EUA will remain  in effect (meaning this test can be used) for the duration of the COVID-19 declaration under Section 564(b)(1) of the Act, 21 U.S.C.section 360bbb-3(b)(1), unless the authorization is terminated  or revoked sooner.       Influenza A by PCR NEGATIVE NEGATIVE Final   Influenza B by PCR NEGATIVE NEGATIVE Final    Comment: (NOTE) The Xpert Xpress SARS-CoV-2/FLU/RSV plus assay is intended as an aid in the diagnosis of influenza from Nasopharyngeal swab specimens and should not be used as a sole basis for treatment. Nasal washings and aspirates are unacceptable for Xpert Xpress SARS-CoV-2/FLU/RSV testing.  Fact Sheet for Patients: bloggercourse.com  Fact Sheet for Healthcare Providers: seriousbroker.it  This test is not yet approved or cleared by the [place]  FDA and has been authorized for detection and/or diagnosis of SARS-CoV-2 by FDA under an Emergency Use Authorization (EUA). This EUA will remain in effect (meaning this test can be used) for the duration of the COVID-19 declaration under Section 564(b)(1) of the Act, 21 U.S.C. section 360bbb-3(b)(1), unless the authorization is terminated or revoked.     Resp Syncytial Virus by PCR NEGATIVE NEGATIVE Final    Comment: (NOTE) Fact Sheet for Patients: bloggercourse.com  Fact Sheet for Healthcare  Providers: seriousbroker.it  This test is not yet approved or cleared by the [place]  FDA and has been authorized for detection and/or diagnosis of SARS-CoV-2 by FDA under an Emergency Use Authorization (EUA). This EUA will remain in effect (meaning this test can be used) for the duration of the COVID-19 declaration under Section 564(b)(1) of the Act, 21 U.S.C. section 360bbb-3(b)(1), unless the authorization is terminated or revoked.  Performed at [facility], [address] [place], [place] [address]   Blood Culture (routine x 2)     Status: None   Collection Time: 10/27/24 12:18 PM   Specimen: Right Antecubital; Blood  Result Value Ref Range Status   Specimen Description RIGHT ANTECUBITAL  Final   Special Requests   Final    BOTTLES DRAWN AEROBIC AND ANAEROBIC Blood Culture adequate volume   Culture   Final    NO GROWTH 5 DAYS Performed at [facility], [address] [place], [place] [address]    Report Status 11/01/2024 FINAL  Final  Blood Culture (routine x 2)     Status: None   Collection Time: 10/27/24 12:18 PM   Specimen: Left Antecubital; Blood  Result Value Ref Range Status   Specimen Description LEFT ANTECUBITAL  Final   Special Requests   Final    BOTTLES DRAWN AEROBIC AND ANAEROBIC Blood Culture results may not be optimal due to an inadequate volume of blood received in culture bottles   Culture   Final    NO GROWTH 5 DAYS Performed at [facility], [address], [place], [place] [address]    Report Status 11/01/2024 FINAL  Final  MRSA Next Gen by PCR, Nasal     Status: Abnormal   Collection Time: 10/27/24  1:38 PM   Specimen: Nasal Mucosa; Nasal Swab  Result Value Ref Range Status   MRSA by PCR Next Gen DETECTED (A) NOT DETECTED Final    Comment: RESULT CALLED TO, READ BACK BY AND VERIFIED WITH: KATHERINE CLAYTON @1520  10/27/24 MJU (NOTE) The GeneXpert MRSA Assay (FDA approved for NASAL  specimens only), is one component of a comprehensive MRSA colonization surveillance program. It is not intended to diagnose MRSA infection  nor to guide or monitor treatment for MRSA infections. Test performance is not FDA approved in patients less than 71 years old. Performed at [facility], [address], [place], [place] [address]          Radiology Studies: No results found.      Scheduled Meds:  alteplase   2 mg Intracatheter Once   Chlorhexidine  Gluconate Cloth  6 each Topical Nightly   feeding supplement (GLUCERNA SHAKE)  237 mL Oral TID BM   furosemide   40 mg Intravenous BID   hydrocortisone  sod succinate (SOLU-CORTEF ) inj  50 mg Intravenous Q8H   insulin  aspart  0-20 Units Subcutaneous Q4H   insulin  aspart  3 Units Subcutaneous Q4H   insulin  glargine  7 Units Subcutaneous QHS   latanoprost   1 drop Both Eyes QHS   levothyroxine   50 mcg Oral QAC breakfast   midodrine   15 mg Oral TID WC   multivitamin  1 tablet Oral QHS   pantoprazole   40 mg Oral Q12H   phytonadione   2.5 mg Oral Daily   sodium chloride  flush  3 mL Intravenous Q12H   Continuous Infusions:  norepinephrine  (LEVOPHED ) Adult infusion Stopped (11/04/24 1226)     LOS: 9 days     Devaughn KATHEE Ban, MD [facility]   If 7PM-7AM, please contact night-coverage [email or web address] Password [email or web address] 11/05/2024, 10:00 AM

## 2024-11-05 NOTE — Progress Notes (Signed)
 [facility] CLINIC CARDIOLOGY PROGRESS NOTE   Patient ID: ROGUE PAUTLER MRN: [id number] DOB/AGE: [birth year] 69 y.o.  Admit date: 10/27/2024 Referring Physician Dr. Juanita Picking Primary Physician Valora Lynwood FALCON, MD  Primary Cardiologist Dr. Florencio Reason for Consultation atrial fibrillation, GI bleed  HPI: JONI COLEGROVE is a 68 y.o. female with a past medical history of coronary artery disease s/p CABG, ischemic cardiomyopathy, chronic HFrEF, history of atrial fibrillation s/p failed Watchman, pulmonary hypertension, hx GI bleed who presented to the ED on 10/27/2024 for bloody drainage from ileostomy. Treated for hemorrhagic shock with improvement in this but with persistent hypotension and fluid retention. Cardiology was consulted for further evaluation.   Interval History:  -Patient seen and examined this AM, resting in hospital bed.  -BP stable, weaned off pressors. No dizziness/lightheadedness. -No significant events noted on tele.   Review of systems complete and found to be negative unless listed above   Vitals:   11/05/24 0730 11/05/24 0745 11/05/24 0800 11/05/24 0815  BP: (!) 92/51  (!) 90/48   Pulse: 83 67 70 84  Resp: (!) 21 19 13  (!) 30  Temp:   97.9 F (36.6 C)   TempSrc:   Oral   SpO2: 100% 98% 99% 100%  Weight:      Height:         Intake/Output Summary (Last 24 hours) at 11/05/2024 9062 Last data filed at 11/05/2024 0912 Gross per 24 hour  Intake 1820.27 ml  Output 3200 ml  Net -1379.73 ml     PHYSICAL EXAM General: Chronically ill appearing female, well nourished, in no acute distress. HEENT: Normocephalic and atraumatic. Neck: No JVD.  Lungs: Normal respiratory effort on room air. Clear bilaterally to auscultation. No wheezes, crackles, rhonchi.  Heart: irregularly irregular, controlled rate. Normal S1 and S2 without gallops or murmurs. Radial & DP pulses 2+ bilaterally. Abdomen: Non-distended appearing.  Msk: Normal strength and tone for  age. Extremities: No clubbing, cyanosis or edema.   Neuro: Alert and oriented X 3. Psych: Mood appropriate, affect congruent.    LABS: Basic Metabolic Panel: Recent Labs    11/04/24 0433 11/05/24 0330  NA 132* 134*  K 4.0 4.2  CL 97* 97*  CO2 26 27  GLUCOSE 141* 87  BUN 24* 27*  CREATININE 1.25* 1.23*  CALCIUM  8.3* 9.1  MG 1.5* 2.2  PHOS 2.8 2.5   Liver Function Tests: No results for input(s): AST, ALT, ALKPHOS, BILITOT, PROT, ALBUMIN  in the last 72 hours.  No results for input(s): LIPASE, AMYLASE in the last 72 hours. CBC: Recent Labs    11/04/24 0433 11/04/24 1852 11/05/24 0330  WBC 6.1  --  4.6  HGB 7.8* 9.4* 9.6*  HCT 24.0* 28.6* 29.3*  MCV 97.6  --  97.7  PLT 63*  --  59*   Cardiac Enzymes: No results for input(s): CKTOTAL, CKMB, CKMBINDEX, TROPONINIHS in the last 72 hours. BNP: No results for input(s): BNP in the last 72 hours. D-Dimer: No results for input(s): DDIMER in the last 72 hours. Hemoglobin A1C: No results for input(s): HGBA1C in the last 72 hours. Fasting Lipid Panel: No results for input(s): CHOL, HDL, LDLCALC, TRIG, CHOLHDL, LDLDIRECT in the last 72 hours. Thyroid  Function Tests: No results for input(s): TSH, T4TOTAL, T3FREE, THYROIDAB in the last 72 hours.  Invalid input(s): FREET3 Anemia Panel: No results for input(s): VITAMINB12, FOLATE, FERRITIN, TIBC, IRON , RETICCTPCT in the last 72 hours.  No results found.   ECHO 07/2024: 1. Left ventricular ejection  fraction, by estimation, is 35 to 40%. The left ventricle has moderately decreased function. The left ventricle demonstrates global hypokinesis. Left ventricular diastolic parameters are indeterminate.   2. Mildly D-shaped interventricular septum suggestive of RV pressure/volume overload. Peak RV-RA gradient 36 mmHg. Right ventricular systolic function is moderately reduced. The right ventricular size is severely enlarged.    3. Left atrial size was severely dilated.   4. Right atrial size was severely dilated.   5. S/p mitral valve repair via mTEER with 1 Mitraclip, position appears  A2/P2. Mean gradient 5 mmHg. Moderate residual mitral regurgitation.   6. The tricuspid valve is abnormal. Tricuspid valve regurgitation is severe.   7. The aortic valve is tricuspid. There is mild calcification of the aortic valve. Aortic valve regurgitation is not visualized. No aortic stenosis is present.   8. The IVC was not visualized.   9. The patient was in atrial fibrillation.   TELEMETRY (personally reviewed): atrial fibrillation rate 60s  EKG (personally reviewed): Atrial fibrillation low voltage nonspecific ST-T wave changes 78 bpm  DATA reviewed by me 11/05/24: last 24h vitals tele labs imaging I/O, hospitalist progress note  Principal Problem:   Hemorrhagic shock (HCC) Active Problems:   Atrial fibrillation, chronic (HCC)   Type 2 diabetes mellitus (HCC)   OSA on CPAP   CAD with hx of CABG   Cirrhosis and Portal hypertension  on CT(HCC)   Chronic systolic CHF (congestive heart failure) (HCC) - LVEF 35 to 40%   Hypothyroidism   Acute renal failure   Ileostomy in place [facility])   Esophageal varices without bleeding (HCC)   Shock (HCC)   Acute GI bleeding   Chronic kidney disease, stage 3b (HCC)    ASSESSMENT AND PLAN: OSIRIS CHARLES is a 68 y.o. female with a past medical history of coronary artery disease s/p CABG, ischemic cardiomyopathy, chronic HFrEF, history of atrial fibrillation s/p failed Watchman, pulmonary hypertension, hx GI bleed who presented to the ED on 10/27/2024 for bloody drainage from ileostomy. Treated for hemorrhagic shock with improvement in this but with persistent hypotension and fluid retention. Cardiology was consulted for further evaluation.   # Acute on chronic HFrEF # GI bleed, hemorrhagic shock # Coronary artery disease # Chronic atrial fibrillation -Continue lasix  40 mg daily  as BP tolerates.  -BP unable to tolerate GDMT. Can consider SGLT2i.  -Midodrine  per primary team. Weaned off pressors.  -Given comorbidities, inability to tolerate anticoagulation, patient would likely not be a candidate for advanced therapies.  -Nephrology following, appreciate their recommendations.  This patient's case was discussed and created with Dr. Ammon and he is in agreement.  Signed:  Danita Bloch, PA-C  11/05/2024, 9:37 AM [facility] Cardiology

## 2024-11-06 DIAGNOSIS — R578 Other shock: Secondary | ICD-10-CM | POA: Diagnosis not present

## 2024-11-06 LAB — CBC
HCT: 30.2 % — ABNORMAL LOW (ref 36.0–46.0)
Hemoglobin: 9.8 g/dL — ABNORMAL LOW (ref 12.0–15.0)
MCH: 31.9 pg (ref 26.0–34.0)
MCHC: 32.5 g/dL (ref 30.0–36.0)
MCV: 98.4 fL (ref 80.0–100.0)
Platelets: 70 K/uL — ABNORMAL LOW (ref 150–400)
RBC: 3.07 MIL/uL — ABNORMAL LOW (ref 3.87–5.11)
RDW: 24.1 % — ABNORMAL HIGH (ref 11.5–15.5)
WBC: 4.1 K/uL (ref 4.0–10.5)
nRBC: 0 % (ref 0.0–0.2)

## 2024-11-06 LAB — BASIC METABOLIC PANEL WITH GFR
Anion gap: 11 (ref 5–15)
BUN: 32 mg/dL — ABNORMAL HIGH (ref 8–23)
CO2: 23 mmol/L (ref 22–32)
Calcium: 9.1 mg/dL (ref 8.9–10.3)
Chloride: 94 mmol/L — ABNORMAL LOW (ref 98–111)
Creatinine, Ser: 1.3 mg/dL — ABNORMAL HIGH (ref 0.44–1.00)
GFR, Estimated: 44 mL/min — ABNORMAL LOW (ref >60)
Glucose, Bld: 195 mg/dL — ABNORMAL HIGH (ref 70–99)
Potassium: 5.2 mmol/L — ABNORMAL HIGH (ref 3.5–5.1)
Sodium: 128 mmol/L — ABNORMAL LOW (ref 135–145)

## 2024-11-06 LAB — GLUCOSE, CAPILLARY
Glucose-Capillary: 148 mg/dL — ABNORMAL HIGH (ref 70–99)
Glucose-Capillary: 205 mg/dL — ABNORMAL HIGH (ref 70–99)
Glucose-Capillary: 277 mg/dL — ABNORMAL HIGH (ref 70–99)
Glucose-Capillary: 267 mg/dL — ABNORMAL HIGH (ref 70–99)

## 2024-11-06 MED ORDER — FUROSEMIDE 10 MG/ML IJ SOLN
40.0000 mg | Freq: Two times a day (BID) | INTRAMUSCULAR | Status: DC
  Administered 2024-11-06 – 2024-11-12 (×13): 40 mg via INTRAVENOUS
  Filled 2024-11-06 (×14): qty 4

## 2024-11-06 MED ORDER — METOLAZONE 2.5 MG PO TABS
2.5000 mg | ORAL_TABLET | Freq: Once | ORAL | Status: AC
  Administered 2024-11-06: 2.5 mg via ORAL
  Filled 2024-11-06: qty 1

## 2024-11-06 MED ORDER — ADULT MULTIVITAMIN W/MINERALS CH
1.0000 | ORAL_TABLET | Freq: Every day | ORAL | Status: DC
  Administered 2024-11-07 – 2024-11-18 (×12): 1 via ORAL
  Filled 2024-11-06 (×12): qty 1

## 2024-11-06 MED ORDER — INSULIN ASPART 100 UNIT/ML IJ SOLN
3.0000 [IU] | Freq: Three times a day (TID) | INTRAMUSCULAR | Status: DC
  Administered 2024-11-07 – 2024-11-08 (×6): 3 [IU] via SUBCUTANEOUS
  Filled 2024-11-06 (×6): qty 3

## 2024-11-06 NOTE — Plan of Care (Signed)
   Problem: Education: Goal: Knowledge of General Education information will improve Description Including pain rating scale, medication(s)/side effects and non-pharmacologic comfort measures Outcome: Progressing

## 2024-11-06 NOTE — Progress Notes (Signed)
 PROGRESS NOTE    Tammy Arias  FMW:[id number] DOB: Apr 13, 1956 DOA: 10/27/2024 PCP: Valora Lynwood FALCON, MD     Brief Narrative:   68 y.o.female with medical problems of heart failure with reduced ejection fraction 25 to 30%, severe mitral regurgitation, moderate right ventricular systolic dysfunction, severe tricuspid regurgitation, atrial fibrillation, failed Watchman device, chronic anticoagulation with apixaban , GI bleed, coronary disease status post CABG in 2005, aortic stenosis repair, pulmonary hypertension, type 2 diabetes, hypertension, liver cirrhosis with grade 2 esophageal varices, C. 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      She reports to ED for generalized malaise and is being admitted for hypotension +GIB from ileostomy   She states she was in her normal state of health on Thanksgiving and was able to eat well. Started feeling bad on Friday. Was unable to eat on Saturday. States she has had a lot of bloody drainage from ileostomy.  had progressive weakness since yesterday.   11/30 admitted for hemorraghic shock, given 3-4 Liters of fluid, baseline SBP 90's 10/29/24- patient remains on vasopressors.  Platelet with significant drop overnight but appears to be chronic when trended over past 5 years.  10/30/24- patient remains critically ill, her renal function is improving, remains with electrolyte derrangements including low phos, low Na, low Ca, elevated glucose.  CBC with borderline low Hb, thrombocytopenia, eosinophilia. Blood cultures negative to date.  RRT is ongoing. S/p 1 unit prbc transfusion today.  10/31/24 - patient with severe electrolyte derrangements, hyponatremia and thrombocytopenia. Clinically improved but still critically ill on vasopressor support 11/01/24- patient restarted on levophed  overnight. No bleeding per ostomy. RIJ TLC +.   Plan to transition to TRH service. 11/02/24- patient required levophed   overnight and remains in ICU.  She is clinically not worse and h/h is trending up. Blood cultures negative. WBC count normal.  11/03/24- patient with component of cardiogenic shock.  She has extensive cardiac history and we have consulted cardiology for additional input. Her main reason for being in ICU is hemoragic shock which is resolved but now cardiac etiology is main culprit of illness.   11/04/24: weaned off vasopressors, but otherwise mental status is improved 11/06/24: Symptomatically improved, continues to have lower extremity swelling, denies any other complaints today, discussed with nephrology and cardiology -Will continue IV Lasix    Assessment & Plan: Circulatory shock Multifactorial 2/2 chf, bleeding, possible adrenal insufficiency - continue midodrine   Adrenal insufficiency? Has been treated for this in the past. Started on stress-dose steroids 12/8 -Tapering hydrocortisone  IV on 50 q12  Acute on chronic HFrEF Recent tte ef 35-40, moderate MR (hx mitral valve repair), severe TR. Volume overloaded now after resuscitation - continue IV diuresis - home coreg , jardiance , nadolol on hold 2/2 hypotension  GI bleeding History AVMs Esophageal varices History recurrent GI bleeds, thorough recent GI evaluation not much they can offer, current bleed thought to be 2/2 bleeding from ostomy now controlled. Received total of 3 units thus far this hospitalization. - monitor  AKI on ckd 3a Required crrt 12/1-12/3, kidney function now back to baseline - Continue IV Lasix , 1 dose of metolazone  2.5mg  - monitor - nephrology following  Hyponatremia Mild hyperkalemia - On IV lasix , Nephrology following  A-fib Rate controlled, not anticoagulated 2/2 recurrent bleeds. S/p watchman procedure  Thrombocytopenia - Monitor platelet count  Cirrhosis Small stable ascites on 12/1 ct - diurese as above  Ileostomy status History c diff colitis S/p colectomy for c diff fulminant colitis -  standard ostomy care  Hypothyroid - home levo - f/u tsh  CAD Hx cabg. No chest pain - resume home statin  OSA Declines cpap in hospital as says can only use nasal cushion  Debility Refuses snf but agreeable to home health  T2DM Steroid-induced hyperglycemia - reduce frequency of ssi from q4 to ac and hs - continue glargine 7 units, add NovoLog  3 units tid  Gout - home allopurinol   Moderate Malnutrition - RD following     DVT prophylaxis: SCDs Code Status: dnr/dni Family Communication: none at bedside  Level of care: Progressive Status is: Inpatient Remains inpatient appropriate because: severity of illness    Consultants:  Nephrology, cardiology  Procedures: crrt  Antimicrobials:  none    Subjective: Reports feeling well. Legs still very swollen  Objective: Vitals:   11/06/24 0715 11/06/24 0730 11/06/24 0745 11/06/24 0800  BP:    112/70  Pulse: 61 (!) 49 66 (!) 146  Resp: (!) 23 (!) 27 15 20   Temp:    97.8 F (36.6 C)  TempSrc:    Oral  SpO2: 100% 100% 99% 98%  Weight:      Height:        Intake/Output Summary (Last 24 hours) at 11/06/2024 1022 Last data filed at 11/06/2024 0908 Gross per 24 hour  Intake 126 ml  Output 1400 ml  Net -1274 ml   Filed Weights   11/01/24 0500 11/04/24 0500 11/05/24 0705  Weight: 73.3 kg 69.1 kg 72 kg    Examination:  General exam: Appears calm and comfortable  Respiratory system: Clear to auscultation. Respiratory effort normal. Cardiovascular system: S1 & S2 heard, irreg irreg, murmur Gastrointestinal system: Abdomen is nondistended, soft and nontender.   Central nervous system: Alert and oriented. No focal neurological deficits. Extremities: Symmetric 5 x 5 power. Pitting edema to abdomen Skin: No rashes, lesions or ulcers Psychiatry: Judgement and insight appear normal. Mood & affect appropriate.     Data Reviewed: I have personally reviewed following labs and imaging studies  CBC: Recent  Labs  Lab 11/02/24 0438 11/02/24 1701 11/03/24 0555 11/03/24 1049 11/03/24 1710 11/04/24 0433 11/04/24 1852 11/05/24 0330 11/06/24 0355  WBC 6.0  --  6.1  --   --  6.1  --  4.6 4.1  HGB 8.1*   < > 7.8*   < > 7.6* 7.8* 9.4* 9.6* 9.8*  HCT 24.7*   < > 23.9*   < > 22.9* 24.0* 28.6* 29.3* 30.2*  MCV 95.0  --  96.8  --   --  97.6  --  97.7 98.4  PLT 49*  --  52*  --   --  63*  --  59* 70*   < > = values in this interval not displayed.   Basic Metabolic Panel: Recent Labs  Lab 11/01/24 0534 11/02/24 0438 11/03/24 0555 11/04/24 0433 11/05/24 0330 11/06/24 0355  NA 126* 128* 128* 132* 134* 128*  K 5.0 4.9 4.7 4.0 4.2 5.2*  CL 95* 96* 95* 97* 97* 94*  CO2 27 25 25 26 27 23   GLUCOSE 134* 153* 144* 141* 87 195*  BUN 16 19 23  24* 27* 32*  CREATININE 1.56* 1.52* 1.43* 1.25* 1.23* 1.30*  CALCIUM  7.2* 7.3* 7.4* 8.3* 9.1 9.1  MG 1.7 1.9 1.9 1.5* 2.2  --   PHOS 1.7* 1.9* 2.0* 2.8 2.5  --    GFR: Estimated Creatinine Clearance: 37.1 mL/min (A) (by C-G formula based on SCr of 1.3 mg/dL (H)). Liver Function Tests:  Recent Labs  Lab 10/31/24 0419 11/01/24 0534 11/02/24 0438  ALBUMIN  3.3* 3.3* 3.4*   No results for input(s): LIPASE, AMYLASE in the last 168 hours. No results for input(s): AMMONIA in the last 168 hours. Coagulation Profile: No results for input(s): INR, PROTIME in the last 168 hours. Cardiac Enzymes: No results for input(s): CKTOTAL, CKMB, CKMBINDEX, TROPONINI in the last 168 hours. BNP (last 3 results) Recent Labs    11/01/24 1946 11/03/24 1049  PROBNP 7,541.0* 6,351.0*   HbA1C: No results for input(s): HGBA1C in the last 72 hours. CBG: Recent Labs  Lab 11/05/24 1108 11/05/24 1602 11/05/24 1934 11/05/24 2328 11/06/24 0722  GLUCAP 257* 252* 212* 255* 148*   Lipid Profile: No results for input(s): CHOL, HDL, LDLCALC, TRIG, CHOLHDL, LDLDIRECT in the last 72 hours. Thyroid  Function Tests: Recent Labs    11/05/24 0330   TSH 1.790   Anemia Panel: No results for input(s): VITAMINB12, FOLATE, FERRITIN, TIBC, IRON , RETICCTPCT in the last 72 hours. Urine analysis:    Component Value Date/Time   COLORURINE AMBER (A) 10/30/2024 0514   APPEARANCEUR CLOUDY (A) 10/30/2024 0514   LABSPEC 1.016 10/30/2024 0514   PHURINE 5.0 10/30/2024 0514   GLUCOSEU 150 (A) 10/30/2024 0514   HGBUR LARGE (A) 10/30/2024 0514   BILIRUBINUR NEGATIVE 10/30/2024 0514   KETONESUR NEGATIVE 10/30/2024 0514   PROTEINUR 100 (A) 10/30/2024 0514   NITRITE NEGATIVE 10/30/2024 0514   LEUKOCYTESUR LARGE (A) 10/30/2024 0514   Sepsis Labs: @LABRCNTIP (procalcitonin:4,lacticidven:4)  ) Recent Results (from the past 240 hours)  Resp panel by RT-PCR (RSV, Flu A&B, Covid) Anterior Nasal Swab     Status: None   Collection Time: 10/27/24 11:01 AM   Specimen: Anterior Nasal Swab  Result Value Ref Range Status   SARS Coronavirus 2 by RT PCR NEGATIVE NEGATIVE Final    Comment: (NOTE) SARS-CoV-2 target nucleic acids are NOT DETECTED.  The SARS-CoV-2 RNA is generally detectable in upper respiratory specimens during the acute phase of infection. The lowest concentration of SARS-CoV-2 viral copies this assay can detect is 138 copies/mL. A negative result does not preclude SARS-Cov-2 infection and should not be used as the sole basis for treatment or other patient management decisions. A negative result may occur with  improper specimen collection/handling, submission of specimen other than nasopharyngeal swab, presence of viral mutation(s) within the areas targeted by this assay, and inadequate number of viral copies(<138 copies/mL). A negative result must be combined with clinical observations, patient history, and epidemiological information. The expected result is Negative.  Fact Sheet for Patients:  bloggercourse.com  Fact Sheet for Healthcare Providers:   seriousbroker.it  This test is no t yet approved or cleared by the [place]  FDA and  has been authorized for detection and/or diagnosis of SARS-CoV-2 by FDA under an Emergency Use Authorization (EUA). This EUA will remain  in effect (meaning this test can be used) for the duration of the COVID-19 declaration under Section 564(b)(1) of the Act, 21 U.S.C.section 360bbb-3(b)(1), unless the authorization is terminated  or revoked sooner.       Influenza A by PCR NEGATIVE NEGATIVE Final   Influenza B by PCR NEGATIVE NEGATIVE Final    Comment: (NOTE) The Xpert Xpress SARS-CoV-2/FLU/RSV plus assay is intended as an aid in the diagnosis of influenza from Nasopharyngeal swab specimens and should not be used as a sole basis for treatment. Nasal washings and aspirates are unacceptable for Xpert Xpress SARS-CoV-2/FLU/RSV testing.  Fact Sheet for Patients: bloggercourse.com  Fact Sheet for  Healthcare Providers: seriousbroker.it  This test is not yet approved or cleared by the [place]  FDA and has been authorized for detection and/or diagnosis of SARS-CoV-2 by FDA under an Emergency Use Authorization (EUA). This EUA will remain in effect (meaning this test can be used) for the duration of the COVID-19 declaration under Section 564(b)(1) of the Act, 21 U.S.C. section 360bbb-3(b)(1), unless the authorization is terminated or revoked.     Resp Syncytial Virus by PCR NEGATIVE NEGATIVE Final    Comment: (NOTE) Fact Sheet for Patients: bloggercourse.com  Fact Sheet for Healthcare Providers: seriousbroker.it  This test is not yet approved or cleared by the [place]  FDA and has been authorized for detection and/or diagnosis of SARS-CoV-2 by FDA under an Emergency Use Authorization (EUA). This EUA will remain in effect (meaning this test can be used) for  the duration of the COVID-19 declaration under Section 564(b)(1) of the Act, 21 U.S.C. section 360bbb-3(b)(1), unless the authorization is terminated or revoked.  Performed at [facility], [address] [place], [place] [address]   Blood Culture (routine x 2)     Status: None   Collection Time: 10/27/24 12:18 PM   Specimen: Right Antecubital; Blood  Result Value Ref Range Status   Specimen Description RIGHT ANTECUBITAL  Final   Special Requests   Final    BOTTLES DRAWN AEROBIC AND ANAEROBIC Blood Culture adequate volume   Culture   Final    NO GROWTH 5 DAYS Performed at [facility], [address] [place], [place] [address]    Report Status 11/01/2024 FINAL  Final  Blood Culture (routine x 2)     Status: None   Collection Time: 10/27/24 12:18 PM   Specimen: Left Antecubital; Blood  Result Value Ref Range Status   Specimen Description LEFT ANTECUBITAL  Final   Special Requests   Final    BOTTLES DRAWN AEROBIC AND ANAEROBIC Blood Culture results may not be optimal due to an inadequate volume of blood received in culture bottles   Culture   Final    NO GROWTH 5 DAYS Performed at [facility], [address] [place], [place] [address]    Report Status 11/01/2024 FINAL  Final  MRSA Next Gen by PCR, Nasal     Status: Abnormal   Collection Time: 10/27/24  1:38 PM   Specimen: Nasal Mucosa; Nasal Swab  Result Value Ref Range Status   MRSA by PCR Next Gen DETECTED (A) NOT DETECTED Final    Comment: RESULT CALLED TO, READ BACK BY AND VERIFIED WITH: KATHERINE CLAYTON @1520  10/27/24 MJU (NOTE) The GeneXpert MRSA Assay (FDA approved for NASAL specimens only), is one component of a comprehensive MRSA colonization surveillance program. It is not intended to diagnose MRSA infection nor to guide or monitor treatment for MRSA infections. Test performance is not FDA approved in patients less than 69 years old. Performed at [facility], [address] [place], [place] [address]          Radiology Studies: No results found.      Scheduled Meds:  allopurinol   300 mg Oral Daily   alteplase   2 mg Intracatheter Once   Chlorhexidine  Gluconate Cloth  6 each Topical Nightly   feeding supplement (GLUCERNA SHAKE)  237 mL Oral TID BM   furosemide   40 mg Intravenous BID   hydrocortisone  sod succinate (SOLU-CORTEF ) inj  50 mg Intravenous Q12H   insulin  aspart  0-15 Units Subcutaneous TID WC   insulin   aspart  0-5 Units Subcutaneous QHS   insulin  glargine  7 Units Subcutaneous QHS   latanoprost   1 drop Both Eyes QHS   levothyroxine   50 mcg Oral QAC breakfast   midodrine   15 mg Oral TID WC   multivitamin  1 tablet Oral QHS   pantoprazole   40 mg Oral Q12H   phytonadione   2.5 mg Oral Daily   rosuvastatin   5 mg Oral Daily   sodium chloride  flush  3 mL Intravenous Q12H   Continuous Infusions:     LOS: 10 days     Laree Lock, MD [facility]   If 7PM-7AM, please contact night-coverage [email or web address] Password [email or web address] 11/06/2024, 10:22 AM

## 2024-11-06 NOTE — Progress Notes (Signed)
 Occupational Therapy Treatment Patient Details Name: Tammy Arias MRN: [id number] DOB: 09/04/1956 Today's Date: 11/06/2024   History of present illness 68 y.o.female with medical problems of heart failure with reduced ejection fraction 25 to 30%, severe mitral regurgitation, moderate right ventricular systolic dysfunction, severe tricuspid regurgitation, atrial fibrillation, failed Watchman device, chronic anticoagulation with apixaban , GI bleed, coronary disease status post CABG in 2005, aortic stenosis repair, pulmonary hypertension, type 2 diabetes, hypertension, liver cirrhosis with grade 2 esophageal varices, C. 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         She reports to ED for generalized malaise and is being admitted for hypotension  +GIB from ileostomy   OT comments  Chart reviewed to date, pt greeted in chair, agreeable to OT tx session targeting improving functional activity tolerance in prep for ADL tasks. Pt requests to amb in hallway on this date. Pt amb approx 250' with supervision-CGA with RW. SET UP for grooming/feeding tasks. Educated pt re cmopesatory techniques with LB swelling. Pt continues to make progress towards goals, discharge recommendation remains appropriate.       If plan is discharge home, recommend the following:  Help with stairs or ramp for entrance;A little help with bathing/dressing/bathroom   Equipment Recommendations  None recommended by OT    Recommendations for Other Services      Precautions / Restrictions Precautions Precautions: Fall Recall of Precautions/Restrictions: Intact Restrictions Weight Bearing Restrictions Per Provider Order: No       Mobility Bed Mobility               General bed mobility comments: NT in recliner pre/post session    Transfers Overall transfer level: Needs assistance Equipment used: Rolling walker (2 wheels) Transfers: Sit to/from Stand Sit to Stand:  Supervision                 Balance Overall balance assessment: Needs assistance Sitting-balance support: Feet supported       Standing balance support: Bilateral upper extremity supported, Reliant on assistive device for balance, During functional activity Standing balance-Leahy Scale: Good                             ADL either performed or assessed with clinical judgement   ADL Overall ADL's : Needs assistance/impaired     Grooming: Set up                   Toilet Transfer: Contact guard assist;Rolling walker (2 wheels);Ambulation Toilet Transfer Details (indicate cue type and reason): simulated   Toileting - Clothing Manipulation Details (indicate cue type and reason): pt reports she has been managing her ostomy     Functional mobility during ADLs: Supervision/safety;Contact guard assist;Rolling walker (2 wheels) (approx 250')      Extremity/Trunk Assessment              Vision       Perception     Praxis     Communication Communication Communication: No apparent difficulties Factors Affecting Communication: Hearing impaired   Cognition Arousal: Alert Behavior During Therapy: WFL for tasks assessed/performed Cognition: No apparent impairments                               Following commands: Intact        Cueing   Cueing Techniques: Verbal cues  Exercises Other  Exercises Other Exercises: edu re role of OT, role of rehab, importance of continued mobility attempts    Shoulder Instructions       General Comments HR to 131 bpm during mobility    Pertinent Vitals/ Pain       Pain Assessment Pain Assessment: No/denies pain Pain Location: but endorses discomfort B thighs rubbing together from swelling Pain Intervention(s): Monitored during session  Home Living                                          Prior Functioning/Environment              Frequency  Min 2X/week         Progress Toward Goals  OT Goals(current goals can now be found in the care plan section)  Progress towards OT goals: Progressing toward goals  Acute Rehab OT Goals Time For Goal Achievement: 11/13/24  Plan      Co-evaluation                 AM-PAC OT 6 Clicks Daily Activity     Outcome Measure   Help from another person eating meals?: None Help from another person taking care of personal grooming?: A Little Help from another person toileting, which includes using toliet, bedpan, or urinal?: A Little Help from another person bathing (including washing, rinsing, drying)?: A Little Help from another person to put on and taking off regular upper body clothing?: None Help from another person to put on and taking off regular lower body clothing?: A Little 6 Click Score: 20    End of Session Equipment Utilized During Treatment: Rolling walker (2 wheels)  OT Visit Diagnosis: Unsteadiness on feet (R26.81);Repeated falls (R29.6);Muscle weakness (generalized) (M62.81)   Activity Tolerance Patient tolerated treatment well   Patient Left in chair;with call bell/phone within reach;with family/visitor present   Nurse Communication Mobility status        Time: 8571-8554 OT Time Calculation (min): 17 min  Charges: OT General Charges $OT Visit: 1 Visit OT Treatments $Therapeutic Activity: 8-22 mins  Therisa Sheffield, OTD OTR/L  11/06/24, 2:51 PM

## 2024-11-06 NOTE — Progress Notes (Signed)
 Physical Therapy Treatment Patient Details Name: Tammy Arias MRN: [id number] DOB: 01-Jun-1956 Today's Date: 11/06/2024   History of Present Illness 68 y.o.female with medical problems of heart failure with reduced ejection fraction 25 to 30%, severe mitral regurgitation, moderate right ventricular systolic dysfunction, severe tricuspid regurgitation, atrial fibrillation, failed Watchman device, chronic anticoagulation with apixaban , GI bleed, coronary disease status post CABG in 2005, aortic stenosis repair, pulmonary hypertension, type 2 diabetes, hypertension, liver cirrhosis with grade 2 esophageal varices, C. 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         She reports to ED for generalized malaise and is being admitted for hypotension  +GIB from ileostomy    PT Comments  Pt received upright in recliner motivated to participate with PT. Pt making good progress towards POC needing only supervision able to progress gait distances to 400' using RW. Consistent step through cadence throughout without rest breaks. Mild decrease in cadence with increased need for BUE support on RW the last 50' due to fatigue and SOB. Max HR upper 120's with activity. SPO2 > 90%. Pt returns to recliner with all needs in reach. SOB eases with PLB. D/c recs remain appropriate. Will benefit gait training without AD to progress towards PLOF next session.    If plan is discharge home, recommend the following: Assistance with cooking/housework;Assist for transportation;Help with stairs or ramp for entrance;A little help with walking and/or transfers;A little help with bathing/dressing/bathroom   Can travel by private vehicle        Equipment Recommendations       Recommendations for Other Services       Precautions / Restrictions Precautions Precautions: Fall Recall of Precautions/Restrictions: Intact Restrictions Weight Bearing Restrictions Per Provider Order: No      Mobility  Bed Mobility               General bed mobility comments: NT in recliner pre/post session Patient Response: Cooperative  Transfers Overall transfer level: Needs assistance Equipment used: Rolling walker (2 wheels) Transfers: Sit to/from Stand Sit to Stand: Supervision                Ambulation/Gait Ambulation/Gait assistance: Supervision Gait Distance (Feet): 400 Feet Assistive device: Rolling walker (2 wheels) Gait Pattern/deviations: Decreased step length - right, Decreased step length - left, Decreased stride length       General Gait Details: 2 laps on ICU unit. Safe and correct use of RW. Increased BUE needs by end of second bout due to LE fatigue and mild SOB.   Stairs             Wheelchair Mobility     Tilt Bed Tilt Bed Patient Response: Cooperative  Modified Rankin (Stroke Patients Only)       Balance Overall balance assessment: Needs assistance Sitting-balance support: Feet supported Sitting balance-Leahy Scale: Normal     Standing balance support: Bilateral upper extremity supported, Reliant on assistive device for balance, During functional activity Standing balance-Leahy Scale: Good                              Communication Communication Communication: No apparent difficulties Factors Affecting Communication: Hearing impaired  Cognition Arousal: Alert Behavior During Therapy: WFL for tasks assessed/performed   PT - Cognitive impairments: No apparent impairments  PT - Cognition Comments: very motivated to mobilize. Following commands: Intact      Cueing Cueing Techniques: Verbal cues  Exercises      General Comments General comments (skin integrity, edema, etc.): max HR to upper 120's with ambulation.      Pertinent Vitals/Pain Pain Assessment Pain Assessment: No/denies pain    Home Living                          Prior Function             PT Goals (current goals can now be found in the care plan section) Acute Rehab PT Goals Patient Stated Goal: Go home (and not rehab) PT Goal Formulation: With patient Time For Goal Achievement: 11/12/24 Potential to Achieve Goals: Good Progress towards PT goals: Progressing toward goals    Frequency    Min 2X/week      PT Plan      Co-evaluation              AM-PAC PT 6 Clicks Mobility   Outcome Measure  Help needed turning from your back to your side while in a flat bed without using bedrails?: None Help needed moving from lying on your back to sitting on the side of a flat bed without using bedrails?: None Help needed moving to and from a bed to a chair (including a wheelchair)?: A Little Help needed standing up from a chair using your arms (e.g., wheelchair or bedside chair)?: A Little Help needed to walk in hospital room?: A Little Help needed climbing 3-5 steps with a railing? : A Little 6 Click Score: 20    End of Session Equipment Utilized During Treatment: Gait belt Activity Tolerance: Patient tolerated treatment well Patient left: in chair;with call bell/phone within reach Nurse Communication: Mobility status PT Visit Diagnosis: Muscle weakness (generalized) (M62.81);Difficulty in walking, not elsewhere classified (R26.2)     Time: 8676-8655 PT Time Calculation (min) (ACUTE ONLY): 21 min  Charges:    $Therapeutic Activity: 8-22 mins PT General Charges $$ ACUTE PT VISIT: 1 Visit                    Dorina HERO. Fairly IV, PT, DPT Physical Therapist- [facility]  [facility] 11/06/2024, 3:14 PM

## 2024-11-06 NOTE — Progress Notes (Addendum)
 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 She reports to ED for generalized malaise and is being admitted for Hemorrhagic shock (HCC) [R57.8] Shock (HCC) [R57.9] Acute GI bleeding [K92.2] Acute renal failure, unspecified acute renal failure type [N17.9]  Patient is known to our practice from previous admissions and has seen Dr Dennise in he office once.   Update: Patient seen sitting at bedside this morning Remains on room air Denies shortness of breath Lower extremity edema remains represent  Objective:  Vital signs in last 24 hours:  Temp:  [97.6 F (36.4 C)-97.8 F (36.6 C)] 97.8 F (36.6 C) (12/10 0800) Pulse Rate:  [38-146] 146 (12/10 0800) Resp:  [13-29] 20 (12/10 0800) BP: (80-126)/(41-75) 112/70 (12/10 0800) SpO2:  [90 %-100 %] 98 % (12/10 0800)  Weight change:  Filed Weights   11/01/24 0500 11/04/24 0500 11/05/24 0705  Weight: 73.3 kg 69.1 kg 72 kg    Intake/Output: I/O last 3 completed shifts: In: 843 [P.O.:834; I.V.:9] Out: 2075 [Urine:950; Stool:1125]   Intake/Output this shift:  Total I/O In: 6 [I.V.:6] Out: 250 [Urine:150; Stool:100]  Physical Exam: General: NAD  Head: Normocephalic, atraumatic. Moist oral mucosa  Eyes: Anicteric  Lungs:  Rhonchi/wheeze, normal effort  Heart: Regular rate and rhythm  Abdomen:  Soft, nontender. Ileostomy   Extremities:  2+ peripheral  edema.  Neurologic: Awake, alert, conversant  Skin: Warm,dry, no rash  Access: None    Basic Metabolic Panel: Recent Labs  Lab 11/01/24 0534 11/02/24 0438 11/03/24 0555 11/04/24 0433 11/05/24 0330 11/06/24 0355  NA 126* 128* 128* 132* 134* 128*  K 5.0 4.9 4.7 4.0 4.2 5.2*  CL 95* 96* 95* 97* 97* 94*  CO2 27 25 25 26 27 23   GLUCOSE 134* 153* 144* 141* 87 195*  BUN 16 19 23  24* 27* 32*  CREATININE 1.56* 1.52* 1.43* 1.25* 1.23* 1.30*  CALCIUM  7.2* 7.3* 7.4* 8.3* 9.1 9.1  MG 1.7 1.9 1.9 1.5* 2.2  --   PHOS 1.7* 1.9* 2.0* 2.8 2.5  --     Liver Function Tests: Recent Labs  Lab 10/31/24 0419 11/01/24 0534 11/02/24 0438  ALBUMIN  3.3* 3.3* 3.4*   No results for input(s): LIPASE, AMYLASE in the last 168 hours.  No results for input(s): AMMONIA in the last 168 hours.  CBC: Recent Labs  Lab 11/02/24 0438 11/02/24 1701 11/03/24 0555 11/03/24 1049 11/03/24 1710 11/04/24 0433 11/04/24 1852 11/05/24 0330 11/06/24 0355  WBC 6.0  --  6.1  --   --  6.1  --  4.6 4.1  HGB 8.1*   < > 7.8*   < > 7.6* 7.8* 9.4* 9.6* 9.8*  HCT 24.7*   < > 23.9*   < > 22.9* 24.0* 28.6* 29.3* 30.2*  MCV 95.0  --  96.8  --   --  97.6  --  97.7 98.4  PLT 49*  --  52*  --   --  63*  --  59* 70*   < > = values in this interval  not displayed.    Cardiac Enzymes: No results for input(s): CKTOTAL, CKMB, CKMBINDEX, TROPONINI in the last 168 hours.  BNP: Invalid input(s): POCBNP  CBG: Recent Labs  Lab 11/05/24 1108 11/05/24 1602 11/05/24 1934 11/05/24 2328 11/06/24 0722  GLUCAP 257* 252* 212* 255* 148*    Microbiology: Results for orders placed or performed during the hospital encounter of 10/27/24  Resp panel by RT-PCR (RSV, Flu A&B, Covid) Anterior Nasal Swab     Status: None   Collection Time: 10/27/24 11:01 AM   Specimen: Anterior Nasal Swab  Result Value Ref Range Status   SARS Coronavirus 2 by RT PCR NEGATIVE NEGATIVE Final    Comment: (NOTE) SARS-CoV-2 target  nucleic acids are NOT DETECTED.  The SARS-CoV-2 RNA is generally detectable in upper respiratory specimens during the acute phase of infection. The lowest concentration of SARS-CoV-2 viral copies this assay can detect is 138 copies/mL. A negative result does not preclude SARS-Cov-2 infection and should not be used as the sole basis for treatment or other patient management decisions. A negative result may occur with  improper specimen collection/handling, submission of specimen other than nasopharyngeal swab, presence of viral mutation(s) within the areas targeted by this assay, and inadequate number of viral copies(<138 copies/mL). A negative result must be combined with clinical observations, patient history, and epidemiological information. The expected result is Negative.  Fact Sheet for Patients:  bloggercourse.com  Fact Sheet for Healthcare Providers:  seriousbroker.it  This test is no t yet approved or cleared by the [place]  FDA and  has been authorized for detection and/or diagnosis of SARS-CoV-2 by FDA under an Emergency Use Authorization (EUA). This EUA will remain  in effect (meaning this test can be used) for the duration of the COVID-19 declaration under Section 564(b)(1) of the Act, 21 U.S.C.section 360bbb-3(b)(1), unless the authorization is terminated  or revoked sooner.       Influenza A by PCR NEGATIVE NEGATIVE Final   Influenza B by PCR NEGATIVE NEGATIVE Final    Comment: (NOTE) The Xpert Xpress SARS-CoV-2/FLU/RSV plus assay is intended as an aid in the diagnosis of influenza from Nasopharyngeal swab specimens and should not be used as a sole basis for treatment. Nasal washings and aspirates are unacceptable for Xpert Xpress SARS-CoV-2/FLU/RSV testing.  Fact Sheet for Patients: bloggercourse.com  Fact Sheet for Healthcare  Providers: seriousbroker.it  This test is not yet approved or cleared by the [place]  FDA and has been authorized for detection and/or diagnosis of SARS-CoV-2 by FDA under an Emergency Use Authorization (EUA). This EUA will remain in effect (meaning this test can be used) for the duration of the COVID-19 declaration under Section 564(b)(1) of the Act, 21 U.S.C. section 360bbb-3(b)(1), unless the authorization is terminated or revoked.     Resp Syncytial Virus by PCR NEGATIVE NEGATIVE Final    Comment: (NOTE) Fact Sheet for Patients: bloggercourse.com  Fact Sheet for Healthcare Providers: seriousbroker.it  This test is not yet approved or cleared by the [place]  FDA and has been authorized for detection and/or diagnosis of SARS-CoV-2 by FDA under an Emergency Use Authorization (EUA). This EUA will remain in effect (meaning this test can be used) for the duration of the COVID-19 declaration under Section 564(b)(1) of the Act, 21 U.S.C. section 360bbb-3(b)(1), unless the authorization is terminated or revoked.  Performed at [facility], [address] [place], [place] [address]   Blood Culture (routine x 2)     Status: None   Collection Time: 10/27/24  12:18 PM   Specimen: Right Antecubital; Blood  Result Value Ref Range Status   Specimen Description RIGHT ANTECUBITAL  Final   Special Requests   Final    BOTTLES DRAWN AEROBIC AND ANAEROBIC Blood Culture adequate volume   Culture   Final    NO GROWTH 5 DAYS Performed at [facility], [address] [place], [place] [address]    Report Status 11/01/2024 FINAL  Final  Blood Culture (routine x 2)     Status: None   Collection Time: 10/27/24 12:18 PM   Specimen: Left Antecubital; Blood  Result Value Ref Range Status   Specimen Description LEFT ANTECUBITAL  Final   Special Requests   Final    BOTTLES DRAWN AEROBIC AND  ANAEROBIC Blood Culture results may not be optimal due to an inadequate volume of blood received in culture bottles   Culture   Final    NO GROWTH 5 DAYS Performed at [facility], [address] [place], [place] [address]    Report Status 11/01/2024 FINAL  Final  MRSA Next Gen by PCR, Nasal     Status: Abnormal   Collection Time: 10/27/24  1:38 PM   Specimen: Nasal Mucosa; Nasal Swab  Result Value Ref Range Status   MRSA by PCR Next Gen DETECTED (A) NOT DETECTED Final    Comment: RESULT CALLED TO, READ BACK BY AND VERIFIED WITH: KATHERINE CLAYTON @1520  10/27/24 MJU (NOTE) The GeneXpert MRSA Assay (FDA approved for NASAL specimens only), is one component of a comprehensive MRSA colonization surveillance program. It is not intended to diagnose MRSA infection nor to guide or monitor treatment for MRSA infections. Test performance is not FDA approved in patients less than 40 years old. Performed at [facility], [address] [place], [place] [address]     Coagulation Studies: No results for input(s): LABPROT, INR in the last 72 hours.   Urinalysis: No results for input(s): COLORURINE, LABSPEC, PHURINE, GLUCOSEU, HGBUR, BILIRUBINUR, KETONESUR, PROTEINUR, UROBILINOGEN, NITRITE, LEUKOCYTESUR in the last 72 hours.  Invalid input(s): APPERANCEUR     Imaging: No results found.     Medications:      allopurinol   300 mg Oral Daily   alteplase   2 mg Intracatheter Once   Chlorhexidine  Gluconate Cloth  6 each Topical Nightly   feeding supplement (GLUCERNA SHAKE)  237 mL Oral TID BM   furosemide   40 mg Intravenous BID   hydrocortisone  sod succinate (SOLU-CORTEF ) inj  50 mg Intravenous Q12H   insulin  aspart  0-15 Units Subcutaneous TID WC   insulin  aspart  0-5 Units Subcutaneous QHS   insulin  glargine  7 Units Subcutaneous QHS   latanoprost   1 drop Both Eyes QHS   levothyroxine   50 mcg Oral QAC breakfast   midodrine   15 mg  Oral TID WC   multivitamin  1 tablet Oral QHS   pantoprazole   40 mg Oral Q12H   phytonadione   2.5 mg Oral Daily   rosuvastatin   5 mg Oral Daily   sodium chloride  flush  3 mL Intravenous Q12H   acetaminophen , docusate sodium , ipratropium-albuterol , ondansetron  (ZOFRAN ) IV, mouth rinse, polyethylene glycol, sodium chloride  flush, traZODone   Assessment/ Plan:  Ms. Tammy Arias is a 68 y.o.  female presents to ED with weakness and has been admitted for Hemorrhagic shock (HCC) [R57.8] Shock (HCC) [R57.9] Acute GI bleeding [K92.2] Acute renal failure, unspecified acute renal failure type [N17.9]   Acute Kidney Injury with Volume overload (LE edema). Patient has baseline creatinine  0.92 on 08/22/24.  Acute kidney injury appears multifactorial at this time from hypotension and dehydration. Potassium 6.1 on admission. Corrected with shifting measures. CRRT from 12/1 to 12/3.   Levo weaned off. Creatinine slightly elevated today, likely due to increased frequency of Furosemide . May have to sustain a higher creatinine to manage fluid volume. Will continue IV furosemide  for now and order one time dose of Metolazone  2.5mg .   Lab Results  Component Value Date   CREATININE 1.30 (H) 11/06/2024   CREATININE 1.23 (H) 11/05/2024   CREATININE 1.25 (H) 11/04/2024    Intake/Output Summary (Last 24 hours) at 11/06/2024 1035 Last data filed at 11/06/2024 0908 Gross per 24 hour  Intake 126 ml  Output 1400 ml  Net -1274 ml    2. Acute metabolic acidosis, S bicarb 9 on ED arrival. Resolved  3. Anemia with suspected acute blood loss Lab Results  Component Value Date   HGB 9.8 (L) 11/06/2024  Has received blood transfusions during this admission. GI has no plans to repeat EGD due to current and recent workups. Hgb continues to correct  4. Hypotension, hemorrhagic shock. Blood pressure stable, Levo off.   LOS: 10 Shantelle Breeze 12/10/202510:35 AM  Patient was seen and examined with Faith Harris, NP.  Plan of care was formulated for the problems addressed and discussed with NP.  I agree with the note as documented except as noted below.

## 2024-11-06 NOTE — Progress Notes (Signed)
 Nutrition Follow Up Note   DOCUMENTATION CODES:   Non-severe (moderate) malnutrition in context of chronic illness  INTERVENTION:   Glucerna Shake po TID, each supplement provides 220 kcal and 10 grams of protein  MVI po daily   Daily weights   NUTRITION DIAGNOSIS:   Moderate Malnutrition related to chronic illness as evidenced by moderate fat depletion, moderate muscle depletion. -ongoing   GOAL:   Patient will meet greater than or equal to 90% of their needs -progressing   MONITOR:   PO intake, Supplement acceptance, Labs, Weight trends, I & O's, Skin  ASSESSMENT:   68 y/o female with h/o ischemic cardiomyopathy, gastric AVM, GI angiodysplasia, esophageal varices, IBS-D, IDA, s/p watchman device, cholelithiasis, TIA, hypothyroidism, pulmonary hypertension, GERD, barrett's esophagus, HLD, HTN, OSA, CAD s/p CABG x 3, PAF, CHF, cirrhosis, portal hypertension, gout, CKD III, B12 deficiency, ventral hernia s/p mesh repair 2018, colonic angioectasias, diverticulosis, DDD, hiatal hernia, C-diff, pancolitis s/p exploratory laparotomy, total colectomy and end ileostomy 02/29/24 complicated by incisional hernia and recent admission for GIB, AKI and shock complicated by vitamin K deficiency and who is now admitted with recurrent GIB, shock and AKI requiring CRRT from 12/1-12/3.  Met with pt in room today. Pt sitting up in the chair and reports that she is feeling much better. Pt reports improved appetite and oral intake; pt is documented to be eating 100% of meals. Pt has been drinking the Glucerna supplements. Pt's vitamin C  deficiency has resolved; pt has been taking supplementation at home. Pt advised to discontinue supplementation and just resume daily MVI. Pt with ongoing vitamin K deficiency; this is being supplemented. Pt will need to monitor her vitamin K levels routinely as pt is at high risk for deficiency r/t cirrhosis and ileostomy; this was dicussed with pt today. Per chart, pt is  up ~36lbs since admission. Pt with significant edema noted in her BLE, hips and buttocks today. Pt is being diuresed.   Medications reviewed and include: allopurinol , lasix , solu-cortef , insulin , synthroid , midodrine , protonix , vitamin K  Labs reviewed: Na 128(L), K 5.2(H), BUN 32(H), creat 1.30(H) P 2.5 wnl, Mg 2.2 wnl- 12/9 Copper  132 wnl, B12 1902(h), vitamin C  1.8 wnl, zinc  50 wnl, vitamin K <0.10(L)- 12/1 Hgb 9.8(L), Hct 30.2(L) Cbgs- 277, 205, 148 x 24hrs   UOP-   NUTRITION - FOCUSED PHYSICAL EXAM:  Flowsheet Row Most Recent Value  Orbital Region Mild depletion  Upper Arm Region Moderate depletion  Thoracic and Lumbar Region Moderate depletion  Buccal Region No depletion  Temple Region Moderate depletion  Clavicle Bone Region Moderate depletion  Clavicle and Acromion Bone Region Moderate depletion  Scapular Bone Region Moderate depletion  Dorsal Hand Mild depletion  Patellar Region Unable to assess  Anterior Thigh Region Unable to assess  Posterior Calf Region Unable to assess  Edema (RD Assessment) Severe  Hair Reviewed  Eyes Reviewed  Mouth Reviewed  Skin Reviewed  Nails Reviewed   Diet Order:   Diet Order             Diet Carb Modified Fluid consistency: Thin  Diet effective now                  EDUCATION NEEDS:   Education needs have been addressed  Skin:  Skin Assessment: Reviewed RN Assessment  Last BM:  12/10- via ostomy  Height:   Ht Readings from Last 1 Encounters:  10/27/24 5' 1 (1.549 m)    Weight:   Wt Readings from Last  1 Encounters:  11/06/24 70.2 kg    Ideal Body Weight:  47.7 kg  BMI:  Body mass index is 29.24 kg/m.  Estimated Nutritional Needs:   Kcal:  1600-1800kcal/day  Protein:  80-90g/day  Fluid:  1.4-1.6L/day  Augustin Shams MS, RD, LDN If unable to be reached, please send secure chat to RD inpatient available from 8:00a-4:00p daily

## 2024-11-06 NOTE — Plan of Care (Signed)
  Problem: Education: Goal: Knowledge of General Education information will improve Description: Including pain rating scale, medication(s)/side effects and non-pharmacologic comfort measures Outcome: Progressing   Problem: Health Behavior/Discharge Planning: Goal: Ability to manage health-related needs will improve Outcome: Progressing   Problem: Clinical Measurements: Goal: Ability to maintain clinical measurements within normal limits will improve Outcome: Progressing Goal: Will remain free from infection Outcome: Progressing Goal: Diagnostic test results will improve Outcome: Progressing Goal: Respiratory complications will improve Outcome: Progressing   Problem: Activity: Goal: Risk for activity intolerance will decrease Outcome: Progressing   Problem: Nutrition: Goal: Adequate nutrition will be maintained Outcome: Progressing   Problem: Elimination: Goal: Will not experience complications related to bowel motility Outcome: Progressing Goal: Will not experience complications related to urinary retention Outcome: Progressing   Problem: Pain Managment: Goal: General experience of comfort will improve and/or be controlled Outcome: Progressing   Problem: Safety: Goal: Ability to remain free from injury will improve Outcome: Progressing   Problem: Skin Integrity: Goal: Risk for impaired skin integrity will decrease Outcome: Progressing

## 2024-11-06 NOTE — Progress Notes (Signed)
 [facility] CLINIC CARDIOLOGY PROGRESS NOTE   Patient ID: Tammy Arias MRN: [id number] DOB/AGE: [birth year] 68 y.o.  Admit date: 10/27/2024 Referring Physician Dr. Juanita Picking Primary Physician Valora Lynwood FALCON, MD  Primary Cardiologist Dr. Florencio Reason for Consultation atrial fibrillation, GI bleed  HPI: Tammy Arias is a 68 y.o. female with a past medical history of coronary artery disease s/p CABG, ischemic cardiomyopathy, chronic HFrEF, history of atrial fibrillation s/p failed Watchman, pulmonary hypertension, hx GI bleed who presented to the ED on 10/27/2024 for bloody drainage from ileostomy. Treated for hemorrhagic shock with improvement in this but with persistent hypotension and fluid retention. Cardiology was consulted for further evaluation.   Interval History:  -Patient seen and examined this AM, sitting upright in hospital bed. -BP stable, weaned off pressors. No dizziness/lightheadedness. -No significant events noted on tele.   Review of systems complete and found to be negative unless listed above   Vitals:   11/06/24 0715 11/06/24 0730 11/06/24 0745 11/06/24 0800  BP:    112/70  Pulse: 61 (!) 49 66 (!) 146  Resp: (!) 23 (!) 27 15 20   Temp:    97.8 F (36.6 C)  TempSrc:    Oral  SpO2: 100% 100% 99% 98%  Weight:      Height:         Intake/Output Summary (Last 24 hours) at 11/06/2024 1011 Last data filed at 11/06/2024 0908 Gross per 24 hour  Intake 126 ml  Output 1400 ml  Net -1274 ml     PHYSICAL EXAM General: Chronically ill appearing female, well nourished, in no acute distress. HEENT: Normocephalic and atraumatic. Neck: No JVD.  Lungs: Normal respiratory effort on room air. Clear bilaterally to auscultation. No wheezes, crackles, rhonchi.  Heart: irregularly irregular, controlled rate. Normal S1 and S2 without gallops or murmurs. Radial & DP pulses 2+ bilaterally. Abdomen: Non-distended appearing.  Msk: Normal strength and tone for  age. Extremities: No clubbing, cyanosis.  Pitting edema. Neuro: Alert and oriented X 3. Psych: Mood appropriate, affect congruent.    LABS: Basic Metabolic Panel: Recent Labs    11/04/24 0433 11/05/24 0330 11/06/24 0355  NA 132* 134* 128*  K 4.0 4.2 5.2*  CL 97* 97* 94*  CO2 26 27 23   GLUCOSE 141* 87 195*  BUN 24* 27* 32*  CREATININE 1.25* 1.23* 1.30*  CALCIUM  8.3* 9.1 9.1  MG 1.5* 2.2  --   PHOS 2.8 2.5  --    Liver Function Tests: No results for input(s): AST, ALT, ALKPHOS, BILITOT, PROT, ALBUMIN  in the last 72 hours.  No results for input(s): LIPASE, AMYLASE in the last 72 hours. CBC: Recent Labs    11/05/24 0330 11/06/24 0355  WBC 4.6 4.1  HGB 9.6* 9.8*  HCT 29.3* 30.2*  MCV 97.7 98.4  PLT 59* 70*   Cardiac Enzymes: No results for input(s): CKTOTAL, CKMB, CKMBINDEX, TROPONINIHS in the last 72 hours. BNP: No results for input(s): BNP in the last 72 hours. D-Dimer: No results for input(s): DDIMER in the last 72 hours. Hemoglobin A1C: No results for input(s): HGBA1C in the last 72 hours. Fasting Lipid Panel: No results for input(s): CHOL, HDL, LDLCALC, TRIG, CHOLHDL, LDLDIRECT in the last 72 hours. Thyroid  Function Tests: Recent Labs    11/05/24 0330  TSH 1.790   Anemia Panel: No results for input(s): VITAMINB12, FOLATE, FERRITIN, TIBC, IRON , RETICCTPCT in the last 72 hours.  No results found.   ECHO 07/2024: 1. Left ventricular ejection fraction, by estimation,  is 35 to 40%. The left ventricle has moderately decreased function. The left ventricle demonstrates global hypokinesis. Left ventricular diastolic parameters are indeterminate.   2. Mildly D-shaped interventricular septum suggestive of RV pressure/volume overload. Peak RV-RA gradient 36 mmHg. Right ventricular systolic function is moderately reduced. The right ventricular size is severely enlarged.   3. Left atrial size was severely dilated.    4. Right atrial size was severely dilated.   5. S/p mitral valve repair via mTEER with 1 Mitraclip, position appears  A2/P2. Mean gradient 5 mmHg. Moderate residual mitral regurgitation.   6. The tricuspid valve is abnormal. Tricuspid valve regurgitation is severe.   7. The aortic valve is tricuspid. There is mild calcification of the aortic valve. Aortic valve regurgitation is not visualized. No aortic stenosis is present.   8. The IVC was not visualized.   9. The patient was in atrial fibrillation.   TELEMETRY (personally reviewed): atrial fibrillation rate 60s  EKG (personally reviewed): Atrial fibrillation low voltage nonspecific ST-T wave changes 78 bpm  DATA reviewed by me 11/06/24: last 24h vitals tele labs imaging I/O, hospitalist progress note  Principal Problem:   Hemorrhagic shock (HCC) Active Problems:   Atrial fibrillation, chronic (HCC)   Type 2 diabetes mellitus (HCC)   OSA on CPAP   CAD with hx of CABG   Cirrhosis and Portal hypertension  on CT(HCC)   Chronic systolic CHF (congestive heart failure) (HCC) - LVEF 35 to 40%   Hypothyroidism   Acute renal failure   Ileostomy in place [facility])   Esophageal varices without bleeding (HCC)   Shock (HCC)   Acute GI bleeding   Chronic kidney disease, stage 3b (HCC)    ASSESSMENT AND PLAN: Tammy Arias is a 68 y.o. female with a past medical history of coronary artery disease s/p CABG, ischemic cardiomyopathy, chronic HFrEF, history of atrial fibrillation s/p failed Watchman, pulmonary hypertension, hx GI bleed who presented to the ED on 10/27/2024 for bloody drainage from ileostomy. Treated for hemorrhagic shock with improvement in this but with persistent hypotension and fluid retention. Cardiology was consulted for further evaluation.   # Acute on chronic HFrEF # GI bleed, hemorrhagic shock # Coronary artery disease # Chronic atrial fibrillation -Diuresis as per nephrology. -BP unable to tolerate GDMT. Can consider  SGLT2i.  -Midodrine  per primary team. Weaned off pressors.  -Given comorbidities, inability to tolerate anticoagulation, patient would likely not be a candidate for advanced therapies.  -Nephrology following, appreciate their recommendations.  This patient's case was discussed and created with Dr. Ammon and he is in agreement.  Signed:  Danita Bloch, PA-C  11/06/2024, 10:11 AM [facility] Cardiology

## 2024-11-07 DIAGNOSIS — R578 Other shock: Secondary | ICD-10-CM | POA: Diagnosis not present

## 2024-11-07 LAB — BASIC METABOLIC PANEL WITH GFR
Anion gap: 14 (ref 5–15)
BUN: 39 mg/dL — ABNORMAL HIGH (ref 8–23)
CO2: 24 mmol/L (ref 22–32)
Calcium: 9.4 mg/dL (ref 8.9–10.3)
Chloride: 94 mmol/L — ABNORMAL LOW (ref 98–111)
Creatinine, Ser: 1.35 mg/dL — ABNORMAL HIGH (ref 0.44–1.00)
GFR, Estimated: 42 mL/min — ABNORMAL LOW (ref >60)
Glucose, Bld: 200 mg/dL — ABNORMAL HIGH (ref 70–99)
Potassium: 3.8 mmol/L (ref 3.5–5.1)
Sodium: 131 mmol/L — ABNORMAL LOW (ref 135–145)

## 2024-11-07 LAB — GLUCOSE, CAPILLARY
Glucose-Capillary: 200 mg/dL — ABNORMAL HIGH (ref 70–99)
Glucose-Capillary: 222 mg/dL — ABNORMAL HIGH (ref 70–99)
Glucose-Capillary: 235 mg/dL — ABNORMAL HIGH (ref 70–99)
Glucose-Capillary: 293 mg/dL — ABNORMAL HIGH (ref 70–99)

## 2024-11-07 LAB — CBC
HCT: 30.4 % — ABNORMAL LOW (ref 36.0–46.0)
Hemoglobin: 10 g/dL — ABNORMAL LOW (ref 12.0–15.0)
MCH: 31.5 pg (ref 26.0–34.0)
MCHC: 32.9 g/dL (ref 30.0–36.0)
MCV: 95.9 fL (ref 80.0–100.0)
Platelets: 67 K/uL — ABNORMAL LOW (ref 150–400)
RBC: 3.17 MIL/uL — ABNORMAL LOW (ref 3.87–5.11)
RDW: 23.7 % — ABNORMAL HIGH (ref 11.5–15.5)
WBC: 3.4 K/uL — ABNORMAL LOW (ref 4.0–10.5)
nRBC: 0 % (ref 0.0–0.2)

## 2024-11-07 MED ORDER — INSULIN GLARGINE 100 UNIT/ML ~~LOC~~ SOLN
10.0000 [IU] | Freq: Every day | SUBCUTANEOUS | Status: DC
  Administered 2024-11-07 – 2024-11-08 (×2): 10 [IU] via SUBCUTANEOUS
  Filled 2024-11-07 (×3): qty 0.1

## 2024-11-07 MED ORDER — HYDROCORTISONE SOD SUC (PF) 100 MG IJ SOLR
25.0000 mg | Freq: Two times a day (BID) | INTRAMUSCULAR | Status: AC
  Administered 2024-11-07 – 2024-11-09 (×4): 25 mg via INTRAVENOUS
  Filled 2024-11-07 (×4): qty 0.5

## 2024-11-07 NOTE — Progress Notes (Signed)
 [facility] CLINIC CARDIOLOGY PROGRESS NOTE   Patient ID: Tammy Arias MRN: [id number] DOB/AGE: 69-Dec-1957 69 y.o.  Admit date: 10/27/2024 Referring Physician Dr. Juanita Picking Primary Physician Valora Lynwood FALCON, MD  Primary Cardiologist Dr. Florencio Reason for Consultation atrial fibrillation, GI bleed  HPI: Tammy Arias is a 68 y.o. female with a past medical history of coronary artery disease s/p CABG, ischemic cardiomyopathy, chronic HFrEF, history of atrial fibrillation s/p failed Watchman, pulmonary hypertension, hx GI bleed who presented to the ED on 10/27/2024 for bloody drainage from ileostomy. Treated for hemorrhagic shock with improvement in this but with persistent hypotension and fluid retention. Cardiology was consulted for further evaluation.   Interval History:  -Patient seen and examined this AM, sitting upright in hospital bed. -BP remains stable. Cr stable, still with significant LE edema.  -No significant events noted on tele.   Review of systems complete and found to be negative unless listed above   Vitals:   11/07/24 0346 11/07/24 0500 11/07/24 0524 11/07/24 0751  BP:      Pulse: 62  62   Resp: 16  19   Temp:    97.6 F (36.4 C)  TempSrc:    Oral  SpO2: 100%  98%   Weight:  73 kg    Height:         Intake/Output Summary (Last 24 hours) at 11/07/2024 0756 Last data filed at 11/07/2024 9351 Gross per 24 hour  Intake 246 ml  Output 3520 ml  Net -3274 ml     PHYSICAL EXAM General: Chronically ill appearing female, well nourished, in no acute distress. HEENT: Normocephalic and atraumatic. Neck: No JVD.  Lungs: Normal respiratory effort on room air. Clear bilaterally to auscultation. No wheezes, crackles, rhonchi.  Heart: irregularly irregular, controlled rate. Normal S1 and S2 without gallops or murmurs. Radial & DP pulses 2+ bilaterally. Abdomen: Non-distended appearing.  Msk: Normal strength and tone for age. Extremities: No clubbing,  cyanosis.  Pitting edema bilaterally. Neuro: Alert and oriented X 3. Psych: Mood appropriate, affect congruent.    LABS: Basic Metabolic Panel: Recent Labs    11/05/24 0330 11/06/24 0355 11/07/24 0354  NA 134* 128* 131*  K 4.2 5.2* 3.8  CL 97* 94* 94*  CO2 27 23 24   GLUCOSE 87 195* 200*  BUN 27* 32* 39*  CREATININE 1.23* 1.30* 1.35*  CALCIUM  9.1 9.1 9.4  MG 2.2  --   --   PHOS 2.5  --   --    Liver Function Tests: No results for input(s): AST, ALT, ALKPHOS, BILITOT, PROT, ALBUMIN  in the last 72 hours.  No results for input(s): LIPASE, AMYLASE in the last 72 hours. CBC: Recent Labs    11/06/24 0355 11/07/24 0354  WBC 4.1 3.4*  HGB 9.8* 10.0*  HCT 30.2* 30.4*  MCV 98.4 95.9  PLT 70* 67*   Cardiac Enzymes: No results for input(s): CKTOTAL, CKMB, CKMBINDEX, TROPONINIHS in the last 72 hours. BNP: No results for input(s): BNP in the last 72 hours. D-Dimer: No results for input(s): DDIMER in the last 72 hours. Hemoglobin A1C: No results for input(s): HGBA1C in the last 72 hours. Fasting Lipid Panel: No results for input(s): CHOL, HDL, LDLCALC, TRIG, CHOLHDL, LDLDIRECT in the last 72 hours. Thyroid  Function Tests: Recent Labs    11/05/24 0330  TSH 1.790   Anemia Panel: No results for input(s): VITAMINB12, FOLATE, FERRITIN, TIBC, IRON , RETICCTPCT in the last 72 hours.  No results found.   ECHO 07/2024: 1.  Left ventricular ejection fraction, by estimation, is 35 to 40%. The left ventricle has moderately decreased function. The left ventricle demonstrates global hypokinesis. Left ventricular diastolic parameters are indeterminate.   2. Mildly D-shaped interventricular septum suggestive of RV pressure/volume overload. Peak RV-RA gradient 36 mmHg. Right ventricular systolic function is moderately reduced. The right ventricular size is severely enlarged.   3. Left atrial size was severely dilated.   4. Right atrial  size was severely dilated.   5. S/p mitral valve repair via mTEER with 1 Mitraclip, position appears  A2/P2. Mean gradient 5 mmHg. Moderate residual mitral regurgitation.   6. The tricuspid valve is abnormal. Tricuspid valve regurgitation is severe.   7. The aortic valve is tricuspid. There is mild calcification of the aortic valve. Aortic valve regurgitation is not visualized. No aortic stenosis is present.   8. The IVC was not visualized.   9. The patient was in atrial fibrillation.   TELEMETRY (personally reviewed): atrial fibrillation rate 50-60s  EKG (personally reviewed): Atrial fibrillation low voltage nonspecific ST-T wave changes 78 bpm  DATA reviewed by me 11/07/2024: last 24h vitals tele labs imaging I/O, hospitalist progress note  Principal Problem:   Hemorrhagic shock (HCC) Active Problems:   Atrial fibrillation, chronic (HCC)   Type 2 diabetes mellitus (HCC)   OSA on CPAP   CAD with hx of CABG   Cirrhosis and Portal hypertension  on CT(HCC)   Chronic systolic CHF (congestive heart failure) (HCC) - LVEF 35 to 40%   Hypothyroidism   Acute renal failure   Ileostomy in place [facility])   Esophageal varices without bleeding (HCC)   Shock (HCC)   Acute GI bleeding   Chronic kidney disease, stage 3b (HCC)    ASSESSMENT AND PLAN: Tammy Arias is a 68 y.o. female with a past medical history of coronary artery disease s/p CABG, ischemic cardiomyopathy, chronic HFrEF, history of atrial fibrillation s/p failed Watchman, pulmonary hypertension, hx GI bleed who presented to the ED on 10/27/2024 for bloody drainage from ileostomy. Treated for hemorrhagic shock with improvement in this but with persistent hypotension and fluid retention. Cardiology was consulted for further evaluation.   # Acute on chronic HFrEF # GI bleed, hemorrhagic shock # Coronary artery disease # Chronic atrial fibrillation -Diuresis as per nephrology. Appreciate their recommendations.  -BP unable to  tolerate GDMT. Can consider SGLT2i.  -Midodrine  per primary team. Weaned off pressors.  -Given comorbidities, inability to tolerate anticoagulation, patient would likely not be a candidate for advanced therapies.   This patient's case was discussed and created with Dr. Ammon and he is in agreement.  Signed:  Danita Bloch, PA-C  11/07/2024, 7:56 AM [facility] Cardiology

## 2024-11-07 NOTE — Progress Notes (Signed)
 PROGRESS NOTE    Tammy Arias  FMW:[id number] DOB: Jan 22, 1956 DOA: 10/27/2024 PCP: Valora Lynwood FALCON, MD     Brief Narrative:   68 y.o.female with medical problems of heart failure with reduced ejection fraction 25 to 30%, severe mitral regurgitation, moderate right ventricular systolic dysfunction, severe tricuspid regurgitation, atrial fibrillation, failed Watchman device, chronic anticoagulation with apixaban , GI bleed, coronary disease status post CABG in 2005, aortic stenosis repair, pulmonary hypertension, type 2 diabetes, hypertension, liver cirrhosis with grade 2 esophageal varices, C. 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      She reports to ED for generalized malaise and is being admitted for hypotension +GIB from ileostomy   She states she was in her normal state of health on Thanksgiving and was able to eat well. Started feeling bad on Friday. Was unable to eat on Saturday. States she has had a lot of bloody drainage from ileostomy.  had progressive weakness since yesterday.   11/30 admitted for hemorraghic shock, given 3-4 Liters of fluid, baseline SBP 90's 10/29/24- patient remains on vasopressors.  Platelet with significant drop overnight but appears to be chronic when trended over past 5 years.  10/30/24- patient remains critically ill, her renal function is improving, remains with electrolyte derrangements including low phos, low Na, low Ca, elevated glucose.  CBC with borderline low Hb, thrombocytopenia, eosinophilia. Blood cultures negative to date.  RRT is ongoing. S/p 1 unit prbc transfusion today.  10/31/24 - patient with severe electrolyte derrangements, hyponatremia and thrombocytopenia. Clinically improved but still critically ill on vasopressor support 11/01/24- patient restarted on levophed  overnight. No bleeding per ostomy. RIJ TLC +.   Plan to transition to TRH service. 11/02/24- patient required levophed   overnight and remains in ICU.  She is clinically not worse and h/h is trending up. Blood cultures negative. WBC count normal.  11/03/24- patient with component of cardiogenic shock.  She has extensive cardiac history and we have consulted cardiology for additional input. Her main reason for being in ICU is hemoragic shock which is resolved but now cardiac etiology is main culprit of illness.   11/04/24: weaned off vasopressors, but otherwise mental status is improved 11/06/24: Symptomatically improved, continues to have lower extremity swelling, denies any other complaints today, discussed with nephrology and cardiology -Will continue IV Lasix  11/07/24: Patient denies any complaints today, decrease dose of stress dose hydrocortisone , IV Lasix  per nephrology   Assessment & Plan: Circulatory shock Multifactorial 2/2 chf, bleeding, possible adrenal insufficiency - continue midodrine   Adrenal insufficiency? Has been treated for this in the past. Started on stress-dose steroids 12/8 -Tapering hydrocortisone  IV to 25 q12  Acute on chronic HFrEF Recent tte ef 35-40, moderate MR (hx mitral valve repair), severe TR. Volume overloaded now after resuscitation - continue IV diuresis - home coreg , jardiance , nadolol on hold 2/2 hypotension  GI bleeding History AVMs Esophageal varices History recurrent GI bleeds, thorough recent GI evaluation not much they can offer, current bleed thought to be 2/2 bleeding from ostomy now controlled. Received total of 3 units thus far this hospitalization. - monitor  AKI on ckd 3a Required crrt 12/1-12/3, Cr ~ 1.35 - Continue IV Lasix , s/p 1 dose Metolazone  2.5mg  - monitor Cr - nephrology following  Hyponatremia - improving - On IV lasix , Nephrology following  A-fib Rate controlled, not anticoagulated 2/2 recurrent bleeds. S/p watchman procedure  Thrombocytopenia - Monitor platelet count  Cirrhosis Small stable ascites on 12/1 ct - diurese as  above  Ileostomy status History c diff colitis S/p colectomy for c diff fulminant colitis - standard ostomy care  Hypothyroid - home levo - f/u tsh  CAD Hx cabg. No chest pain - resume home statin  OSA Declines cpap in hospital as says can only use nasal cushion  Debility Refuses snf but agreeable to home health  T2DM Steroid-induced hyperglycemia - reduce frequency of ssi from q4 to ac and hs - Inc glargine 10 units, add NovoLog  3 units tid  Gout - home allopurinol   Moderate Malnutrition - RD following     DVT prophylaxis: SCDs Code Status: dnr/dni Family Communication: none at bedside  Level of care: Progressive Status is: Inpatient Remains inpatient appropriate because: severity of illness    Consultants:  Nephrology, cardiology  Procedures: crrt  Antimicrobials:  none    Subjective: Reports feeling well. Legs still very swollen, continue IV diuresis per Nephrology  Objective: Vitals:   11/07/24 1400 11/07/24 1500 11/07/24 1600 11/07/24 1700  BP: 118/65 (!) 126/56 130/73 114/70  Pulse: 88 75 71 75  Resp: 15 20 16 15   Temp:   98.5 F (36.9 C)   TempSrc:   Oral   SpO2: 100% 98% 100% 100%  Weight:      Height:        Intake/Output Summary (Last 24 hours) at 11/07/2024 1741 Last data filed at 11/07/2024 1716 Gross per 24 hour  Intake 483 ml  Output 4720 ml  Net -4237 ml   Filed Weights   11/05/24 0705 11/06/24 0717 11/07/24 0500  Weight: 72 kg 70.2 kg 73 kg    Examination:  General exam: Appears calm and comfortable  Respiratory system: Clear to auscultation. Respiratory effort normal. Cardiovascular system: S1 & S2 heard, irreg irreg, murmur Gastrointestinal system: Abdomen is nondistended, soft and nontender.   Central nervous system: Alert and oriented. No focal neurological deficits. Extremities: Symmetric 5 x 5 power. Pitting edema to abdomen Skin: No rashes, lesions or ulcers Psychiatry: Judgement and insight appear  normal. Mood & affect appropriate.     Data Reviewed: I have personally reviewed following labs and imaging studies  CBC: Recent Labs  Lab 11/03/24 0555 11/03/24 1049 11/04/24 0433 11/04/24 1852 11/05/24 0330 11/06/24 0355 11/07/24 0354  WBC 6.1  --  6.1  --  4.6 4.1 3.4*  HGB 7.8*   < > 7.8* 9.4* 9.6* 9.8* 10.0*  HCT 23.9*   < > 24.0* 28.6* 29.3* 30.2* 30.4*  MCV 96.8  --  97.6  --  97.7 98.4 95.9  PLT 52*  --  63*  --  59* 70* 67*   < > = values in this interval not displayed.   Basic Metabolic Panel: Recent Labs  Lab 11/01/24 0534 11/02/24 0438 11/03/24 0555 11/04/24 0433 11/05/24 0330 11/06/24 0355 11/07/24 0354  NA 126* 128* 128* 132* 134* 128* 131*  K 5.0 4.9 4.7 4.0 4.2 5.2* 3.8  CL 95* 96* 95* 97* 97* 94* 94*  CO2 27 25 25 26 27 23 24   GLUCOSE 134* 153* 144* 141* 87 195* 200*  BUN 16 19 23  24* 27* 32* 39*  CREATININE 1.56* 1.52* 1.43* 1.25* 1.23* 1.30* 1.35*  CALCIUM  7.2* 7.3* 7.4* 8.3* 9.1 9.1 9.4  MG 1.7 1.9 1.9 1.5* 2.2  --   --   PHOS 1.7* 1.9* 2.0* 2.8 2.5  --   --    GFR: Estimated Creatinine Clearance: 35.9 mL/min (A) (by C-G formula based on SCr of 1.35 mg/dL (H)). Liver  Function Tests: Recent Labs  Lab 11/01/24 0534 11/02/24 0438  ALBUMIN  3.3* 3.4*   No results for input(s): LIPASE, AMYLASE in the last 168 hours. No results for input(s): AMMONIA in the last 168 hours. Coagulation Profile: No results for input(s): INR, PROTIME in the last 168 hours. Cardiac Enzymes: No results for input(s): CKTOTAL, CKMB, CKMBINDEX, TROPONINI in the last 168 hours. BNP (last 3 results) Recent Labs    11/01/24 1946 11/03/24 1049  PROBNP 7,541.0* 6,351.0*   HbA1C: No results for input(s): HGBA1C in the last 72 hours. CBG: Recent Labs  Lab 11/06/24 1548 11/06/24 2125 11/07/24 0736 11/07/24 1145 11/07/24 1616  GLUCAP 277* 267* 200* 222* 235*   Lipid Profile: No results for input(s): CHOL, HDL, LDLCALC, TRIG,  CHOLHDL, LDLDIRECT in the last 72 hours. Thyroid  Function Tests: Recent Labs    11/05/24 0330  TSH 1.790   Anemia Panel: No results for input(s): VITAMINB12, FOLATE, FERRITIN, TIBC, IRON , RETICCTPCT in the last 72 hours. Urine analysis:    Component Value Date/Time   COLORURINE AMBER (A) 10/30/2024 0514   APPEARANCEUR CLOUDY (A) 10/30/2024 0514   LABSPEC 1.016 10/30/2024 0514   PHURINE 5.0 10/30/2024 0514   GLUCOSEU 150 (A) 10/30/2024 0514   HGBUR LARGE (A) 10/30/2024 0514   BILIRUBINUR NEGATIVE 10/30/2024 0514   KETONESUR NEGATIVE 10/30/2024 0514   PROTEINUR 100 (A) 10/30/2024 0514   NITRITE NEGATIVE 10/30/2024 0514   LEUKOCYTESUR LARGE (A) 10/30/2024 0514   Sepsis Labs: @LABRCNTIP (procalcitonin:4,lacticidven:4)  ) No results found for this or any previous visit (from the past 240 hours).        Radiology Studies: No results found.      Scheduled Meds:  allopurinol   300 mg Oral Daily   alteplase   2 mg Intracatheter Once   Chlorhexidine  Gluconate Cloth  6 each Topical Nightly   feeding supplement (GLUCERNA SHAKE)  237 mL Oral TID BM   furosemide   40 mg Intravenous BID   hydrocortisone  sod succinate (SOLU-CORTEF ) inj  25 mg Intravenous Q12H   insulin  aspart  0-15 Units Subcutaneous TID WC   insulin  aspart  0-5 Units Subcutaneous QHS   insulin  aspart  3 Units Subcutaneous TID WC   insulin  glargine  7 Units Subcutaneous QHS   latanoprost   1 drop Both Eyes QHS   levothyroxine   50 mcg Oral QAC breakfast   midodrine   15 mg Oral TID WC   multivitamin with minerals  1 tablet Oral Daily   pantoprazole   40 mg Oral Q12H   phytonadione   2.5 mg Oral Daily   rosuvastatin   5 mg Oral Daily   sodium chloride  flush  3 mL Intravenous Q12H   Continuous Infusions:     LOS: 11 days     Laree Lock, MD [facility]   If 7PM-7AM, please contact night-coverage [email or web address] Password [email or web address] 11/07/2024, 5:41 PM

## 2024-11-07 NOTE — Inpatient Diabetes Management (Signed)
 Inpatient Diabetes Program Recommendations  AACE/ADA: New Consensus Statement on Inpatient Glycemic Control (2015)  Target Ranges:  Prepandial:   less than 140 mg/dL      Peak postprandial:   less than 180 mg/dL (1-2 hours)      Critically ill patients:  140 - 180 mg/dL   Lab Results  Component Value Date   GLUCAP 222 (H) 11/07/2024   HGBA1C 5.5 10/28/2024    Review of Glycemic Control  Latest Reference Range & Units 11/06/24 07:22 11/06/24 11:49 11/06/24 15:48 11/06/24 21:25 11/07/24 07:36 11/07/24 11:45  Glucose-Capillary 70 - 99 mg/dL 851 (H) 794 (H) 722 (H) 267 (H) 200 (H) 222 (H)  (H): Data is abnormally high  Current orders for Inpatient glycemic control: Semglee  7 units at bedtime, Novolog  0-15 units TID and 0-5 units QHS, Novolog  3 units Q4H; Solucortef 25 mg Q12H   Inpatient Diabetes Program Recommendations:    Might consider:  Semglee  10 units every day  Thank you, Wyvonna Pinal, MSN, CDCES Diabetes Coordinator Inpatient Diabetes Program [phone] (team pager from 8a-5p)

## 2024-11-07 NOTE — Progress Notes (Signed)
 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 She reports to ED for generalized malaise and is being admitted for Hemorrhagic shock (HCC) [R57.8] Shock (HCC) [R57.9] Acute GI bleeding [K92.2] Acute renal failure, unspecified acute renal failure type [N17.9]  Patient is known to our practice from previous admissions and has seen Dr Dennise in he office once.   Update: Patient seen sitting in bed Eating breakfast Room air Lower extremity edema remains same  UOP  Objective:  Vital signs in last 24 hours:  Temp:  [97.5 F (36.4 C)-97.9 F (36.6 C)] 97.6 F (36.4 C) (12/11 0751) Pulse Rate:  [44-126] 73 (12/11 1000) Resp:  [11-23] 18 (12/11 1100) BP: (96-120)/(49-96) 111/57 (12/11 0900) SpO2:  [80 %-100 %] 100 % (12/11 1000) Weight:  [73 kg] 73 kg (12/11 0500)  Weight change: -1.8 kg Filed Weights   11/05/24 0705 11/06/24 0717 11/07/24 0500  Weight: 72 kg 70.2 kg 73 kg    Intake/Output: I/O last 3 completed shifts: In: 246 [P.O.:240; I.V.:6] Out: 4220 [Urine:3170; Stool:1050]   Intake/Output this shift:  Total I/O In: 3 [I.V.:3] Out: 1000 [Urine:800; Stool:200]  Physical Exam: General: NAD  Head: Normocephalic, atraumatic. Moist oral mucosa  Eyes: Anicteric  Lungs:  Rhonchi/wheeze, normal effort  Heart: Regular rate and rhythm  Abdomen:  Soft, nontender. Ileostomy   Extremities:   2+ peripheral edema.  Neurologic: Awake, alert, conversant  Skin: Warm,dry, no rash  Access: None    Basic Metabolic Panel: Recent Labs  Lab 11/01/24 0534 11/02/24 0438 11/03/24 0555 11/04/24 0433 11/05/24 0330 11/06/24 0355 11/07/24 0354  NA 126* 128* 128* 132* 134* 128* 131*  K 5.0 4.9 4.7 4.0 4.2 5.2* 3.8  CL 95* 96* 95* 97* 97* 94* 94*  CO2 27 25 25 26 27 23 24   GLUCOSE 134* 153* 144* 141* 87 195* 200*  BUN 16 19 23  24* 27* 32* 39*  CREATININE 1.56* 1.52* 1.43* 1.25* 1.23* 1.30* 1.35*  CALCIUM  7.2* 7.3* 7.4* 8.3* 9.1 9.1 9.4  MG 1.7 1.9 1.9 1.5* 2.2  --   --   PHOS 1.7* 1.9* 2.0* 2.8 2.5  --   --     Liver Function Tests: Recent Labs  Lab 11/01/24 0534 11/02/24 0438  ALBUMIN  3.3* 3.4*   No results for input(s): LIPASE, AMYLASE in the last 168 hours.  No results for input(s): AMMONIA in the last 168 hours.  CBC: Recent Labs  Lab 11/03/24 0555 11/03/24 1049 11/04/24 0433 11/04/24 1852 11/05/24 0330 11/06/24 0355 11/07/24 0354  WBC 6.1  --  6.1  --  4.6 4.1 3.4*  HGB 7.8*   < > 7.8* 9.4* 9.6* 9.8* 10.0*  HCT 23.9*   < > 24.0* 28.6* 29.3* 30.2* 30.4*  MCV 96.8  --  97.6  --  97.7 98.4 95.9  PLT 52*  --  63*  --  59* 70* 67*   < > = values in this interval not displayed.    Cardiac Enzymes: No results for input(s): CKTOTAL, CKMB,  CKMBINDEX, TROPONINI in the last 168 hours.  BNP: Invalid input(s): POCBNP  CBG: Recent Labs  Lab 11/06/24 0722 11/06/24 1149 11/06/24 1548 11/06/24 2125 11/07/24 0736  GLUCAP 148* 205* 277* 267* 200*    Microbiology: Results for orders placed or performed during the hospital encounter of 10/27/24  Resp panel by RT-PCR (RSV, Flu A&B, Covid) Anterior Nasal Swab     Status: None   Collection Time: 10/27/24 11:01 AM   Specimen: Anterior Nasal Swab  Result Value Ref Range Status   SARS Coronavirus 2 by RT PCR NEGATIVE NEGATIVE Final    Comment: (NOTE) SARS-CoV-2 target nucleic acids are NOT  DETECTED.  The SARS-CoV-2 RNA is generally detectable in upper respiratory specimens during the acute phase of infection. The lowest concentration of SARS-CoV-2 viral copies this assay can detect is 138 copies/mL. A negative result does not preclude SARS-Cov-2 infection and should not be used as the sole basis for treatment or other patient management decisions. A negative result may occur with  improper specimen collection/handling, submission of specimen other than nasopharyngeal swab, presence of viral mutation(s) within the areas targeted by this assay, and inadequate number of viral copies(<138 copies/mL). A negative result must be combined with clinical observations, patient history, and epidemiological information. The expected result is Negative.  Fact Sheet for Patients:  bloggercourse.com  Fact Sheet for Healthcare Providers:  seriousbroker.it  This test is no t yet approved or cleared by the [place]  FDA and  has been authorized for detection and/or diagnosis of SARS-CoV-2 by FDA under an Emergency Use Authorization (EUA). This EUA will remain  in effect (meaning this test can be used) for the duration of the COVID-19 declaration under Section 564(b)(1) of the Act, 21 U.S.C.section 360bbb-3(b)(1), unless the authorization is terminated  or revoked sooner.       Influenza A by PCR NEGATIVE NEGATIVE Final   Influenza B by PCR NEGATIVE NEGATIVE Final    Comment: (NOTE) The Xpert Xpress SARS-CoV-2/FLU/RSV plus assay is intended as an aid in the diagnosis of influenza from Nasopharyngeal swab specimens and should not be used as a sole basis for treatment. Nasal washings and aspirates are unacceptable for Xpert Xpress SARS-CoV-2/FLU/RSV testing.  Fact Sheet for Patients: bloggercourse.com  Fact Sheet for Healthcare Providers: seriousbroker.it  This test is not yet  approved or cleared by the [place]  FDA and has been authorized for detection and/or diagnosis of SARS-CoV-2 by FDA under an Emergency Use Authorization (EUA). This EUA will remain in effect (meaning this test can be used) for the duration of the COVID-19 declaration under Section 564(b)(1) of the Act, 21 U.S.C. section 360bbb-3(b)(1), unless the authorization is terminated or revoked.     Resp Syncytial Virus by PCR NEGATIVE NEGATIVE Final    Comment: (NOTE) Fact Sheet for Patients: bloggercourse.com  Fact Sheet for Healthcare Providers: seriousbroker.it  This test is not yet approved or cleared by the [place]  FDA and has been authorized for detection and/or diagnosis of SARS-CoV-2 by FDA under an Emergency Use Authorization (EUA). This EUA will remain in effect (meaning this test can be used) for the duration of the COVID-19 declaration under Section 564(b)(1) of the Act, 21 U.S.C. section 360bbb-3(b)(1), unless the authorization is terminated or revoked.  Performed at [facility], [address] [place], [place] [address]   Blood Culture (routine x 2)     Status: None   Collection Time: 10/27/24 12:18 PM   Specimen: Right Antecubital; Blood  Result Value Ref Range  Status   Specimen Description RIGHT ANTECUBITAL  Final   Special Requests   Final    BOTTLES DRAWN AEROBIC AND ANAEROBIC Blood Culture adequate volume   Culture   Final    NO GROWTH 5 DAYS Performed at [facility], [address] [place], [place] [address]    Report Status 11/01/2024 FINAL  Final  Blood Culture (routine x 2)     Status: None   Collection Time: 10/27/24 12:18 PM   Specimen: Left Antecubital; Blood  Result Value Ref Range Status   Specimen Description LEFT ANTECUBITAL  Final   Special Requests   Final    BOTTLES DRAWN AEROBIC AND ANAEROBIC Blood Culture results may not be optimal due to an inadequate volume of  blood received in culture bottles   Culture   Final    NO GROWTH 5 DAYS Performed at [facility], [address] [place], [place] [address]    Report Status 11/01/2024 FINAL  Final  MRSA Next Gen by PCR, Nasal     Status: Abnormal   Collection Time: 10/27/24  1:38 PM   Specimen: Nasal Mucosa; Nasal Swab  Result Value Ref Range Status   MRSA by PCR Next Gen DETECTED (A) NOT DETECTED Final    Comment: RESULT CALLED TO, READ BACK BY AND VERIFIED WITH: KATHERINE CLAYTON @1520  10/27/24 MJU (NOTE) The GeneXpert MRSA Assay (FDA approved for NASAL specimens only), is one component of a comprehensive MRSA colonization surveillance program. It is not intended to diagnose MRSA infection nor to guide or monitor treatment for MRSA infections. Test performance is not FDA approved in patients less than 86 years old. Performed at [facility], [address] [place], [place] [address]     Coagulation Studies: No results for input(s): LABPROT, INR in the last 72 hours.   Urinalysis: No results for input(s): COLORURINE, LABSPEC, PHURINE, GLUCOSEU, HGBUR, BILIRUBINUR, KETONESUR, PROTEINUR, UROBILINOGEN, NITRITE, LEUKOCYTESUR in the last 72 hours.  Invalid input(s): APPERANCEUR     Imaging: No results found.     Medications:      allopurinol   300 mg Oral Daily   alteplase   2 mg Intracatheter Once   Chlorhexidine  Gluconate Cloth  6 each Topical Nightly   feeding supplement (GLUCERNA SHAKE)  237 mL Oral TID BM   furosemide   40 mg Intravenous BID   hydrocortisone  sod succinate (SOLU-CORTEF ) inj  25 mg Intravenous Q12H   insulin  aspart  0-15 Units Subcutaneous TID WC   insulin  aspart  0-5 Units Subcutaneous QHS   insulin  aspart  3 Units Subcutaneous TID WC   insulin  glargine  7 Units Subcutaneous QHS   latanoprost   1 drop Both Eyes QHS   levothyroxine   50 mcg Oral QAC breakfast   midodrine   15 mg Oral TID WC   multivitamin with  minerals  1 tablet Oral Daily   pantoprazole   40 mg Oral Q12H   phytonadione   2.5 mg Oral Daily   rosuvastatin   5 mg Oral Daily   sodium chloride  flush  3 mL Intravenous Q12H   acetaminophen , docusate sodium , ipratropium-albuterol , ondansetron  (ZOFRAN ) IV, mouth rinse, polyethylene glycol, sodium chloride  flush, traZODone   Assessment/ Plan:  Tammy Arias is a 68 y.o.  female presents to ED with weakness and has been admitted for Hemorrhagic shock (HCC) [R57.8] Shock (HCC) [R57.9] Acute GI bleeding [K92.2] Acute renal failure, unspecified acute renal failure type [N17.9]   Acute Kidney Injury with Volume overload (LE edema). Patient has baseline creatinine 0.92  on 08/22/24.  Acute kidney injury appears multifactorial at this time from hypotension and dehydration. Potassium 6.1 on admission. Corrected with shifting measures. CRRT from 12/1 to 12/3.   Renal function remains stable. Patient did receive Metolazone  2.5mg  yesterday. of urine output recorded. Continue scheduled IV furosemide .   Lab Results  Component Value Date   CREATININE 1.35 (H) 11/07/2024   CREATININE 1.30 (H) 11/06/2024   CREATININE 1.23 (H) 11/05/2024    Intake/Output Summary (Last 24 hours) at 11/07/2024 1134 Last data filed at 11/07/2024 1132 Gross per 24 hour  Intake 243 ml  Output 4270 ml  Net -4027 ml    2. Acute metabolic acidosis, S bicarb 9 on ED arrival. Resolved  3. Anemia with suspected acute blood loss Lab Results  Component Value Date   HGB 10.0 (L) 11/07/2024  Has received blood transfusions during this admission. GI has no plans to repeat EGD due to current and recent workups. Hgb 10.0  4. Hypotension, hemorrhagic shock. Blood pressure stable, Levo off.   LOS: 11 Janyia Guion 12/11/202511:34 AM

## 2024-11-07 NOTE — Plan of Care (Signed)
°  Problem: Education: Goal: Knowledge of General Education information will improve Description: Including pain rating scale, medication(s)/side effects and non-pharmacologic comfort measures Outcome: Progressing   Problem: Health Behavior/Discharge Planning: Goal: Ability to manage health-related needs will improve Outcome: Progressing   Problem: Clinical Measurements: Goal: Ability to maintain clinical measurements within normal limits will improve Outcome: Progressing Goal: Will remain free from infection Outcome: Progressing Goal: Diagnostic test results will improve Outcome: Progressing Goal: Respiratory complications will improve Outcome: Progressing   Problem: Activity: Goal: Risk for activity intolerance will decrease Outcome: Progressing   Problem: Nutrition: Goal: Adequate nutrition will be maintained Outcome: Progressing   Problem: Coping: Goal: Level of anxiety will decrease Outcome: Progressing   Problem: Elimination: Goal: Will not experience complications related to bowel motility Outcome: Progressing Goal: Will not experience complications related to urinary retention Outcome: Progressing   Problem: Pain Managment: Goal: General experience of comfort will improve and/or be controlled Outcome: Progressing   Problem: Safety: Goal: Ability to remain free from injury will improve Outcome: Progressing   Problem: Skin Integrity: Goal: Risk for impaired skin integrity will decrease Outcome: Progressing   Problem: Coping: Goal: Ability to adjust to condition or change in health will improve Outcome: Progressing   Problem: Fluid Volume: Goal: Ability to maintain a balanced intake and output will improve Outcome: Progressing   Problem: Health Behavior/Discharge Planning: Goal: Ability to identify and utilize available resources and services will improve Outcome: Progressing Goal: Ability to manage health-related needs will improve Outcome:  Progressing   Problem: Skin Integrity: Goal: Risk for impaired skin integrity will decrease Outcome: Progressing   Problem: Tissue Perfusion: Goal: Adequacy of tissue perfusion will improve Outcome: Progressing

## 2024-11-08 DIAGNOSIS — R600 Localized edema: Secondary | ICD-10-CM | POA: Diagnosis not present

## 2024-11-08 DIAGNOSIS — I5022 Chronic systolic (congestive) heart failure: Secondary | ICD-10-CM

## 2024-11-08 DIAGNOSIS — K766 Portal hypertension: Secondary | ICD-10-CM

## 2024-11-08 LAB — CBC
HCT: 28.3 % — ABNORMAL LOW (ref 36.0–46.0)
Hemoglobin: 9.3 g/dL — ABNORMAL LOW (ref 12.0–15.0)
MCH: 31.6 pg (ref 26.0–34.0)
MCHC: 32.9 g/dL (ref 30.0–36.0)
MCV: 96.3 fL (ref 80.0–100.0)
Platelets: 71 K/uL — ABNORMAL LOW (ref 150–400)
RBC: 2.94 MIL/uL — ABNORMAL LOW (ref 3.87–5.11)
RDW: 22.9 % — ABNORMAL HIGH (ref 11.5–15.5)
WBC: 2.6 K/uL — ABNORMAL LOW (ref 4.0–10.5)
nRBC: 0 % (ref 0.0–0.2)

## 2024-11-08 LAB — GLUCOSE, CAPILLARY
Glucose-Capillary: 176 mg/dL — ABNORMAL HIGH (ref 70–99)
Glucose-Capillary: 282 mg/dL — ABNORMAL HIGH (ref 70–99)
Glucose-Capillary: 301 mg/dL — ABNORMAL HIGH (ref 70–99)
Glucose-Capillary: 326 mg/dL — ABNORMAL HIGH (ref 70–99)
Glucose-Capillary: 192 mg/dL — ABNORMAL HIGH (ref 70–99)

## 2024-11-08 LAB — BPAM RBC
Blood Product Expiration Date: 202512192359
Blood Product Expiration Date: 202601072359
Blood Product Expiration Date: 202601102359
ISSUE DATE / TIME: 202512081428
Unit Type and Rh: 600
Unit Type and Rh: 600
Unit Type and Rh: 9500

## 2024-11-08 LAB — TYPE AND SCREEN
ABO/RH(D): A NEG
Antibody Screen: POSITIVE
Unit division: 0
Unit division: 0
Unit division: 0
Unit division: 0

## 2024-11-08 LAB — BASIC METABOLIC PANEL WITH GFR
Anion gap: 14 (ref 5–15)
BUN: 43 mg/dL — ABNORMAL HIGH (ref 8–23)
CO2: 28 mmol/L (ref 22–32)
Calcium: 8.7 mg/dL — ABNORMAL LOW (ref 8.9–10.3)
Chloride: 93 mmol/L — ABNORMAL LOW (ref 98–111)
Creatinine, Ser: 1.27 mg/dL — ABNORMAL HIGH (ref 0.44–1.00)
GFR, Estimated: 46 mL/min — ABNORMAL LOW (ref >60)
Glucose, Bld: 208 mg/dL — ABNORMAL HIGH (ref 70–99)
Potassium: 2.7 mmol/L — CL (ref 3.5–5.1)
Sodium: 135 mmol/L (ref 135–145)

## 2024-11-08 MED ORDER — SPIRONOLACTONE 25 MG PO TABS
25.0000 mg | ORAL_TABLET | Freq: Two times a day (BID) | ORAL | Status: DC
  Administered 2024-11-08 – 2024-11-14 (×12): 25 mg via ORAL
  Filled 2024-11-08 (×12): qty 1

## 2024-11-08 MED ORDER — POTASSIUM CHLORIDE 20 MEQ PO PACK
40.0000 meq | PACK | Freq: Once | ORAL | Status: AC
  Administered 2024-11-08: 40 meq via ORAL
  Filled 2024-11-08: qty 2

## 2024-11-08 MED ORDER — POTASSIUM CHLORIDE 10 MEQ/100ML IV SOLN
10.0000 meq | INTRAVENOUS | Status: AC
  Administered 2024-11-08 (×2): 10 meq via INTRAVENOUS
  Filled 2024-11-08 (×2): qty 100

## 2024-11-08 NOTE — Inpatient Diabetes Management (Signed)
 Inpatient Diabetes Program Recommendations  AACE/ADA: New Consensus Statement on Inpatient Glycemic Control   Target Ranges:  Prepandial:   less than 140 mg/dL      Peak postprandial:   less than 180 mg/dL (1-2 hours)      Critically ill patients:  140 - 180 mg/dL    Latest Reference Range & Units 11/07/24 07:36 11/07/24 11:45 11/07/24 16:16 11/07/24 19:55 11/08/24 07:37  Glucose-Capillary 70 - 99 mg/dL 799 (H) 777 (H) 764 (H) 293 (H) 176 (H)   Review of Glycemic Control  Diabetes history: DM2 Outpatient Diabetes medications: Jardiance  10 mg every day, Metformin  1000 mg BID  Current orders for Inpatient glycemic control: Lantus  10 units at bedtime, Novolog  3 units TID with meals, Novolog  0-15 units TID with meals, Novolog  0-5 units at bedtime; Solucortef 25 mg Q12H  Inpatient Diabetes Program Recommendations:    Insulin : If steroids are continued as ordered, please consider increasing meal coverage to Novolog  6 units TID with meals if patient eats at least 50% of meals.  Thanks, Earnie Gainer, RN, MSN, CDCES Diabetes Coordinator Inpatient Diabetes Program [phone] (Team Pager from 8am to 5pm)

## 2024-11-08 NOTE — Progress Notes (Signed)
 Physical Therapy Treatment Patient Details Name: Tammy Arias MRN: [id number] DOB: December 25, 1955 Today's Date: 11/08/2024   History of Present Illness 68 y.o.female with medical problems of heart failure with reduced ejection fraction 25 to 30%, severe mitral regurgitation, moderate right ventricular systolic dysfunction, severe tricuspid regurgitation, atrial fibrillation, failed Watchman device, chronic anticoagulation with apixaban , GI bleed, coronary disease status post CABG in 2005, aortic stenosis repair, pulmonary hypertension, type 2 diabetes, hypertension, liver cirrhosis with grade 2 esophageal varices, C. 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         She reports to ED for generalized malaise and is being admitted for hypotension  +GIB from ileostomy    PT Comments  Pt received upright in bed agreeable to PT. Mod-I for mobility OOB. Able to stand at supervision needing CGA without AD completing ~80' needing RUE on hand rail with step to pattern. Does rely on RW for remainder of bout with notable improved gait quality. Improved HR response (max HR 103 BPM) as well with further distances compared to prior session indicative of improved capacity for OOB tasks. Pt remains falls risk and below baseline due to need for AD.  Pt in recliner with all needs in reach. D/c recs remain appropriate.    If plan is discharge home, recommend the following: Assistance with cooking/housework;Assist for transportation;Help with stairs or ramp for entrance;A little help with walking and/or transfers;A little help with bathing/dressing/bathroom   Can travel by private vehicle        Equipment Recommendations       Recommendations for Other Services       Precautions / Restrictions Precautions Precautions: Fall Recall of Precautions/Restrictions: Intact Restrictions Weight Bearing Restrictions Per Provider Order: No     Mobility  Bed  Mobility Overal bed mobility: Modified Independent               Patient Response: Cooperative  Transfers Overall transfer level: Needs assistance Equipment used: None Transfers: Sit to/from Stand Sit to Stand: Supervision                Ambulation/Gait Ambulation/Gait assistance: Supervision, Contact guard assist Gait Distance (Feet): 500 Feet Assistive device: Rolling walker (2 wheels), None   Gait velocity: 10' in 8 sec = 1.25'/sec with RW Gait velocity interpretation: <1.31 ft/sec, indicative of household ambulator   General Gait Details: step to pattern ~11' with RUE on hand rail without AD needing CGA. Completes rest of gait with RW at step through pattern.   Stairs             Wheelchair Mobility     Tilt Bed Tilt Bed Patient Response: Cooperative  Modified Rankin (Stroke Patients Only)       Balance Overall balance assessment: Mild deficits observed, not formally tested                                          Communication Communication Communication: No apparent difficulties Factors Affecting Communication: Hearing impaired  Cognition Arousal: Alert Behavior During Therapy: WFL for tasks assessed/performed   PT - Cognitive impairments: No apparent impairments                       PT - Cognition Comments: very motivated to mobilize. Following commands: Intact      Cueing Cueing Techniques: Verbal cues  Exercises      General Comments General comments (skin integrity, edema, etc.): Max HR 103 BPM.      Pertinent Vitals/Pain Pain Assessment Pain Assessment: No/denies pain    Home Living                          Prior Function            PT Goals (current goals can now be found in the care plan section) Acute Rehab PT Goals Patient Stated Goal: Go home (and not rehab) PT Goal Formulation: With patient Time For Goal Achievement: 11/12/24 Potential to Achieve Goals: Good Progress  towards PT goals: Progressing toward goals    Frequency    Min 2X/week      PT Plan      Co-evaluation              AM-PAC PT 6 Clicks Mobility   Outcome Measure  Help needed turning from your back to your side while in a flat bed without using bedrails?: None Help needed moving from lying on your back to sitting on the side of a flat bed without using bedrails?: None Help needed moving to and from a bed to a chair (including a wheelchair)?: None Help needed standing up from a chair using your arms (e.g., wheelchair or bedside chair)?: A Little Help needed to walk in hospital room?: A Little Help needed climbing 3-5 steps with a railing? : A Little 6 Click Score: 21    End of Session Equipment Utilized During Treatment: Gait belt Activity Tolerance: Patient tolerated treatment well Patient left: in chair;with call bell/phone within reach Nurse Communication: Mobility status PT Visit Diagnosis: Muscle weakness (generalized) (M62.81);Difficulty in walking, not elsewhere classified (R26.2)     Time: 8646-8588 PT Time Calculation (min) (ACUTE ONLY): 18 min  Charges:    $Gait Training: 8-22 mins PT General Charges $$ ACUTE PT VISIT: 1 Visit                     Dorina HERO. Fairly IV, PT, DPT Physical Therapist- [place]  [facility] 11/08/2024, 3:36 PM

## 2024-11-08 NOTE — Progress Notes (Addendum)
 [facility]  - [place] [facility]   PATIENT NAME: Tammy Arias    MR#:  [id number]  DATE OF BIRTH:  11-Apr-1956  SUBJECTIVE:  no family at bedside. Patient sitting up at the bedside. She is very cheerful. Tells me she has come a long ways. Still legs remains swollen. Making good urine. Tolerating PO diet. Working with PT.    VITALS:  Blood pressure (!) 109/58, pulse 72, temperature 97.7 F (36.5 C), resp. rate 18, height 5' 1 (1.549 m), weight 72.5 kg, SpO2 100%.  PHYSICAL EXAMINATION:   GENERAL:  68 y.o.-year-old patient with no acute distress.  LUNGS: Normal breath sounds bilaterally, no wheezing CARDIOVASCULAR: S1, S2 normal. No murmur   ABDOMEN: Soft, colostomy + EXTREMITIES: +++ edema b/l.    NEUROLOGIC: nonfocal  patient is alert and awake SKIN: No obvious rash, lesion, or ulcer.   LABORATORY PANEL:  CBC Recent Labs  Lab 11/08/24 0424  WBC 2.6*  HGB 9.3*  HCT 28.3*  PLT 71*    Chemistries  Recent Labs  Lab 11/05/24 0330 11/06/24 0355 11/08/24 0424  NA 134*   < > 135  K 4.2   < > 2.7*  CL 97*   < > 93*  CO2 27   < > 28  GLUCOSE 87   < > 208*  BUN 27*   < > 43*  CREATININE 1.23*   < > 1.27*  CALCIUM  9.1   < > 8.7*  MG 2.2  --   --    < > = values in this interval not displayed.   Assessment and Plan 68 y.o.female with medical problems of heart failure with reduced ejection fraction 25 to 30%, severe mitral regurgitation, moderate right ventricular systolic dysfunction, severe tricuspid regurgitation, atrial fibrillation, failed Watchman device, chronic anticoagulation with apixaban , GI bleed, coronary disease status post CABG in 2005, aortic stenosis repair, pulmonary hypertension, type 2 diabetes, hypertension, liver cirrhosis with grade 2 esophageal varices, C. 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      She reports to ED for generalized malaise and is being admitted for  hypotension +GIB from ileostomy  Pt has been admitted fro >10 days--follow daily notes for detail progress  Circulatory shock Multifactorial 2/2 chf, bleeding, possible adrenal insufficiency - continue midodrine    Adrenal insufficiency? Has been treated for this in the past. Started on stress-dose steroids 12/8 -Tapering hydrocortisone  IV to 25 q12--couple doses left   Acute on chronic HFrEF Severe bilateral leg edema Recent tte ef 35-40, moderate MR (hx mitral valve repair), severe TR. Volume overloaded now after resuscitation - continue IV diuresis - home coreg , jardiance , nadolol on hold 2/2 hypotension   GI bleeding History AVMs Esophageal varices History recurrent GI bleeds, thorough recent GI evaluation not much they can offer, current bleed thought to be 2/2 bleeding from ostomy now controlled. Received total of 3 units thus far this hospitalization. - monitor   AKI on ckd 3a Required crrt 12/1-12/3, Cr ~ 1.35 - Continue IV Lasix , s/p 1 dose Metolazone  2.5mg  - monitor Cr - nephrology following --good UOP   Hyponatremia - improving - On IV lasix , Nephrology following   A-fib Rate controlled, not anticoagulated 2/2 recurrent bleeds. S/p watchman procedure   Thrombocytopenia - Monitor platelet count   Cirrhosis Small stable ascites on 12/1 ct - diurese as above   Ileostomy status History c diff colitis S/p colectomy for c diff fulminant colitis - standard ostomy  care   Hypothyroid - home levothyroxine    CAD Hx cabg. No chest pain - resume home statin   OSA Declines cpap in hospital as says can only use nasal cushion   Debility Refuses snf but agreeable to home health   T2DM Steroid-induced hyperglycemia - reduce frequency of ssi from q4 to ac and hs - Inc glargine 10 units, add NovoLog  3 units tid   Gout - home allopurinol    Moderate Malnutrition - RD following      Procedures: Family communication :none today Consults Nephrology,  cardiology--[facility] CODE STATUS: DNR DVT Prophylaxis :scd Level of care: Progressive Status is: Inpatient Remains inpatient appropriate because: IV diuresis    TOTAL TIME TAKING CARE OF THIS PATIENT: 35 minutes.  >50% time spent on counselling and coordination of care  Note: This dictation was prepared with Dragon dictation along with smaller phrase technology. Any transcriptional errors that result from this process are unintentional.  Leita Blanch M.D    [facility]   CC: Primary care physician; Valora Lynwood FALCON, MD

## 2024-11-08 NOTE — Plan of Care (Signed)

## 2024-11-08 NOTE — Progress Notes (Signed)
 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 She reports to ED for generalized malaise and is being admitted for Hemorrhagic shock (HCC) [R57.8] Shock (HCC) [R57.9] Acute GI bleeding [K92.2] Acute renal failure, unspecified acute renal failure type [N17.9]  Patient is known to our practice from previous admissions and office  She has been transferred out of ICU. Continues to have significant lower extremity edema. Appetite is fair.  No complaints of nausea or vomiting. Sitting up in recliner chair this morning   Objective:  Vital signs in last 24 hours:  Temp:  [97.6 F (36.4 C)-98.5 F (36.9 C)] 97.6 F (36.4 C) (12/12 0736) Pulse Rate:  [60-99] 69 (12/12 0736) Resp:  [15-20] 18 (12/12 0736) BP: (91-131)/(46-73) 105/62 (12/12 0736) SpO2:  [97 %-100 %] 99 % (12/12 0736) Weight:  [72.5 kg] 72.5 kg (12/12 0500)  Weight change: 2.3 kg Filed Weights   11/06/24 0717 11/07/24 0500 11/08/24 0500  Weight: 70.2 kg 73 kg 72.5 kg    Intake/Output: I/O last 3 completed shifts: In: 603 [P.O.:600; I.V.:3] Out: 6900 [Urine:6050; Stool:850]   Intake/Output this shift:  Total I/O In: 460.3 [P.O.:360; IV Piggyback:100.3] Out: -   Physical Exam: General: NAD  Head: Normocephalic, atraumatic. Moist oral mucosa  Eyes: Anicteric  Lungs:  Rhonchi/wheeze, normal effort  Heart: Regular rate and rhythm   Abdomen:  Soft, nontender. Ileostomy   Extremities:  2+ peripheral edema.  Neurologic: Awake, alert, conversant  Skin: Warm,dry, no rash  Access: None    Basic Metabolic Panel: Recent Labs  Lab 11/02/24 0438 11/03/24 0555 11/04/24 0433 11/05/24 0330 11/06/24 0355 11/07/24 0354 11/08/24 0424  NA 128* 128* 132* 134* 128* 131* 135  K 4.9 4.7 4.0 4.2 5.2* 3.8 2.7*  CL 96* 95* 97* 97* 94* 94* 93*  CO2 25 25 26 27 23 24 28   GLUCOSE 153* 144* 141* 87 195* 200* 208*  BUN 19 23 24* 27* 32* 39* 43*  CREATININE 1.52* 1.43* 1.25* 1.23* 1.30* 1.35* 1.27*  CALCIUM  7.3* 7.4* 8.3* 9.1 9.1 9.4 8.7*  MG 1.9 1.9 1.5* 2.2  --   --   --   PHOS 1.9* 2.0* 2.8 2.5  --   --   --     Liver Function Tests: Recent Labs  Lab 11/02/24 0438  ALBUMIN  3.4*   No results for input(s): LIPASE, AMYLASE in the last 168 hours.  No results for input(s): AMMONIA in the last 168 hours.  CBC: Recent Labs  Lab 11/04/24 0433 11/04/24 1852 11/05/24 0330 11/06/24 0355 11/07/24 0354 11/08/24 0424  WBC 6.1  --  4.6 4.1 3.4* 2.6*  HGB 7.8* 9.4* 9.6* 9.8* 10.0* 9.3*  HCT 24.0* 28.6* 29.3* 30.2* 30.4* 28.3*  MCV 97.6  --  97.7 98.4 95.9 96.3  PLT 63*  --  59* 70* 67* 71*    Cardiac Enzymes: No results for input(s): CKTOTAL, CKMB, CKMBINDEX, TROPONINI in the last 168 hours.  BNP: Invalid input(s): POCBNP  CBG: Recent Labs  Lab 11/07/24 0736 11/07/24 1145  11/07/24 1616 11/07/24 1955 11/08/24 0737  GLUCAP 200* 222* 235* 293* 176*    Microbiology: Results for orders placed or performed during the hospital encounter of 10/27/24  Resp panel by RT-PCR (RSV, Flu A&B, Covid) Anterior Nasal Swab     Status: None   Collection Time: 10/27/24 11:01 AM   Specimen: Anterior Nasal Swab  Result Value Ref Range Status   SARS Coronavirus 2 by RT PCR NEGATIVE NEGATIVE Final    Comment: (NOTE) SARS-CoV-2 target nucleic acids are NOT DETECTED.  The SARS-CoV-2 RNA is generally detectable in  upper respiratory specimens during the acute phase of infection. The lowest concentration of SARS-CoV-2 viral copies this assay can detect is 138 copies/mL. A negative result does not preclude SARS-Cov-2 infection and should not be used as the sole basis for treatment or other patient management decisions. A negative result may occur with  improper specimen collection/handling, submission of specimen other than nasopharyngeal swab, presence of viral mutation(s) within the areas targeted by this assay, and inadequate number of viral copies(<138 copies/mL). A negative result must be combined with clinical observations, patient history, and epidemiological information. The expected result is Negative.  Fact Sheet for Patients:  bloggercourse.com  Fact Sheet for Healthcare Providers:  seriousbroker.it  This test is no t yet approved or cleared by the [place]  FDA and  has been authorized for detection and/or diagnosis of SARS-CoV-2 by FDA under an Emergency Use Authorization (EUA). This EUA will remain  in effect (meaning this test can be used) for the duration of the COVID-19 declaration under Section 564(b)(1) of the Act, 21 U.S.C.section 360bbb-3(b)(1), unless the authorization is terminated  or revoked sooner.       Influenza A by PCR NEGATIVE NEGATIVE Final   Influenza B by PCR NEGATIVE NEGATIVE Final    Comment: (NOTE) The Xpert Xpress SARS-CoV-2/FLU/RSV plus assay is intended as an aid in the diagnosis of influenza from Nasopharyngeal swab specimens and should not be used as a sole basis for treatment. Nasal washings and aspirates are unacceptable for Xpert Xpress SARS-CoV-2/FLU/RSV testing.  Fact Sheet for Patients: bloggercourse.com  Fact Sheet for Healthcare Providers: seriousbroker.it  This test is not yet approved or cleared by the [place]  FDA and has been  authorized for detection and/or diagnosis of SARS-CoV-2 by FDA under an Emergency Use Authorization (EUA). This EUA will remain in effect (meaning this test can be used) for the duration of the COVID-19 declaration under Section 564(b)(1) of the Act, 21 U.S.C. section 360bbb-3(b)(1), unless the authorization is terminated or revoked.     Resp Syncytial Virus by PCR NEGATIVE NEGATIVE Final    Comment: (NOTE) Fact Sheet for Patients: bloggercourse.com  Fact Sheet for Healthcare Providers: seriousbroker.it  This test is not yet approved or cleared by the [place]  FDA and has been authorized for detection and/or diagnosis of SARS-CoV-2 by FDA under an Emergency Use Authorization (EUA). This EUA will remain in effect (meaning this test can be used) for the duration of the COVID-19 declaration under Section 564(b)(1) of the Act, 21 U.S.C. section 360bbb-3(b)(1), unless the authorization is terminated or revoked.  Performed at [facility], [address], [place], [place] [address]   Blood Culture (routine x 2)     Status: None   Collection Time: 10/27/24 12:18 PM   Specimen: Right Antecubital; Blood  Result Value Ref Range Status   Specimen Description RIGHT ANTECUBITAL  Final   Special Requests   Final    BOTTLES DRAWN AEROBIC  AND ANAEROBIC Blood Culture adequate volume   Culture   Final    NO GROWTH 5 DAYS Performed at [facility], [address] [place], [place] [address]    Report Status 11/01/2024 FINAL  Final  Blood Culture (routine x 2)     Status: None   Collection Time: 10/27/24 12:18 PM   Specimen: Left Antecubital; Blood  Result Value Ref Range Status   Specimen Description LEFT ANTECUBITAL  Final   Special Requests   Final    BOTTLES DRAWN AEROBIC AND ANAEROBIC Blood Culture results may not be optimal due to an inadequate volume of blood received in culture bottles   Culture   Final    NO  GROWTH 5 DAYS Performed at [facility], [address], [place], [place] [address]    Report Status 11/01/2024 FINAL  Final  MRSA Next Gen by PCR, Nasal     Status: Abnormal   Collection Time: 10/27/24  1:38 PM   Specimen: Nasal Mucosa; Nasal Swab  Result Value Ref Range Status   MRSA by PCR Next Gen DETECTED (A) NOT DETECTED Final    Comment: RESULT CALLED TO, READ BACK BY AND VERIFIED WITH: KATHERINE CLAYTON @1520  10/27/24 MJU (NOTE) The GeneXpert MRSA Assay (FDA approved for NASAL specimens only), is one component of a comprehensive MRSA colonization surveillance program. It is not intended to diagnose MRSA infection nor to guide or monitor treatment for MRSA infections. Test performance is not FDA approved in patients less than 73 years old. Performed at [facility], [address] [place], [place] [address]     Coagulation Studies: No results for input(s): LABPROT, INR in the last 72 hours.   Urinalysis: No results for input(s): COLORURINE, LABSPEC, PHURINE, GLUCOSEU, HGBUR, BILIRUBINUR, KETONESUR, PROTEINUR, UROBILINOGEN, NITRITE, LEUKOCYTESUR in the last 72 hours.  Invalid input(s): APPERANCEUR     Imaging: No results found.     Medications:      allopurinol   300 mg Oral Daily   alteplase   2 mg Intracatheter Once   Chlorhexidine  Gluconate Cloth  6 each Topical Nightly   feeding supplement (GLUCERNA SHAKE)  237 mL Oral TID BM   furosemide   40 mg Intravenous BID   hydrocortisone  sod succinate (SOLU-CORTEF ) inj  25 mg Intravenous Q12H   insulin  aspart  0-15 Units Subcutaneous TID WC   insulin  aspart  0-5 Units Subcutaneous QHS   insulin  aspart  3 Units Subcutaneous TID WC   insulin  glargine  10 Units Subcutaneous QHS   latanoprost   1 drop Both Eyes QHS   levothyroxine   50 mcg Oral QAC breakfast   midodrine   15 mg Oral TID WC   multivitamin with minerals  1 tablet Oral Daily   pantoprazole   40 mg Oral Q12H    phytonadione   2.5 mg Oral Daily   rosuvastatin   5 mg Oral Daily   sodium chloride  flush  3 mL Intravenous Q12H   acetaminophen , docusate sodium , ipratropium-albuterol , ondansetron  (ZOFRAN ) IV, mouth rinse, polyethylene glycol, sodium chloride  flush, traZODone   Assessment/ Plan:  Ms. Tammy Arias is a 68 y.o.  female presents to ED with weakness and has been admitted for Hemorrhagic shock (HCC) [R57.8] Shock (HCC) [R57.9] Acute GI bleeding [K92.2] Acute renal failure, unspecified acute renal failure type [N17.9]   Acute Kidney Injury - Patient has baseline creatinine 0.92 on 08/22/24.  Acute kidney injury appears multifactorial at this time from hypotension and dehydration. Potassium 6.1 on admission. Corrected with shifting measures.  Required CRRT  from 12/1 to 12/3.  Renal function remains stable.   Lab Results  Component Value Date   CREATININE 1.27 (H) 11/08/2024   CREATININE 1.35 (H) 11/07/2024   CREATININE 1.30 (H) 11/06/2024    Intake/Output Summary (Last 24 hours) at 11/08/2024 1015 Last data filed at 11/08/2024 1006 Gross per 24 hour  Intake 820.31 ml  Output 4050 ml  Net -3229.69 ml    2. Hypokalemia Likely secondary to aggressive IV diuresis. Getting IV potassium replacement. Add oral spironolactone  25 mg twice a day.  3. LE edema Patient received Metolazone  2.5mg  on Wednesday. Good response. Continue scheduled IV furosemide .  - add spironolactone    4. Anemia with suspected acute blood loss Lab Results  Component Value Date   HGB 9.3 (L) 11/08/2024  Has received blood transfusions during this admission. GI has no plans to repeat EGD due to current and recent workups.     LOS: 12 Yides Saidi 12/12/202510:15 AM

## 2024-11-08 NOTE — Progress Notes (Signed)
 [facility] Cardiology  SUBJECTIVE: Patient laying in bed, denies chest pain or shortness of breath   Vitals:   11/07/24 2335 11/08/24 0437 11/08/24 0500 11/08/24 0736  BP: (!) 91/48 (!) 101/50  105/62  Pulse: 64 70  69  Resp: 20 18  18   Temp: 97.9 F (36.6 C) 98 F (36.7 C)  97.6 F (36.4 C)  TempSrc:      SpO2: 100% 100%  99%  Weight:   72.5 kg   Height:         Intake/Output Summary (Last 24 hours) at 11/08/2024 0843 Last data filed at 11/08/2024 0630 Gross per 24 hour  Intake 363 ml  Output 4650 ml  Net -4287 ml      PHYSICAL EXAM  General: Well developed, well nourished, in no acute distress HEENT:  Normocephalic and atramatic Neck:  No JVD.  Lungs: Clear bilaterally to auscultation and percussion. Heart: HRRR . Normal S1 and S2 without gallops or murmurs.  Abdomen: Bowel sounds are positive, abdomen soft and non-tender  Msk:  Back normal, normal gait. Normal strength and tone for age. Extremities: No clubbing, cyanosis or edema.   Neuro: Alert and oriented X 3. Psych:  Good affect, responds appropriately   LABS: Basic Metabolic Panel: Recent Labs    11/07/24 0354 11/08/24 0424  NA 131* 135  K 3.8 2.7*  CL 94* 93*  CO2 24 28  GLUCOSE 200* 208*  BUN 39* 43*  CREATININE 1.35* 1.27*  CALCIUM  9.4 8.7*   Liver Function Tests: No results for input(s): AST, ALT, ALKPHOS, BILITOT, PROT, ALBUMIN  in the last 72 hours. No results for input(s): LIPASE, AMYLASE in the last 72 hours. CBC: Recent Labs    11/07/24 0354 11/08/24 0424  WBC 3.4* 2.6*  HGB 10.0* 9.3*  HCT 30.4* 28.3*  MCV 95.9 96.3  PLT 67* 71*   Cardiac Enzymes: No results for input(s): CKTOTAL, CKMB, CKMBINDEX, TROPONINI in the last 72 hours. BNP: Invalid input(s): POCBNP D-Dimer: No results for input(s): DDIMER in the last 72 hours. Hemoglobin A1C: No results for input(s): HGBA1C in the last 72 hours. Fasting Lipid Panel: No results for input(s): CHOL,  HDL, LDLCALC, TRIG, CHOLHDL, LDLDIRECT in the last 72 hours. Thyroid  Function Tests: No results for input(s): TSH, T4TOTAL, T3FREE, THYROIDAB in the last 72 hours.  Invalid input(s): FREET3 Anemia Panel: No results for input(s): VITAMINB12, FOLATE, FERRITIN, TIBC, IRON , RETICCTPCT in the last 72 hours.  No results found.   Echo LVEF 35-40%, biatrial enlargement, status post MitraClip, severe tricuspid regurgitation  TELEMETRY: Atrial fibrillation:  ASSESSMENT AND PLAN:  Principal Problem:   Hemorrhagic shock (HCC) Active Problems:   Atrial fibrillation, chronic (HCC)   Type 2 diabetes mellitus (HCC)   OSA on CPAP   CAD with hx of CABG   Cirrhosis and Portal hypertension  on CT(HCC)   Chronic systolic CHF (congestive heart failure) (HCC) - LVEF 35 to 40%   Hypothyroidism   Acute renal failure   Ileostomy in place [facility])   Esophageal varices without bleeding (HCC)   Shock (HCC)   Acute GI bleeding   Chronic kidney disease, stage 3b (HCC)    1.  Acute on chronic HFrEF, LVEF 35-40%, clinically improved on furosemide  40 mg IV twice daily, unable to tolerate good medical management due to low blood pressure, on midodrine  2.  Chronic atrial fibrillation, with controlled rate, unable to tolerate chronic anticoagulation 3.  GI bleed, hemorrhagic shock, hemoglobin and hematocrit 9.3 and 28.3, respectively 4.  CKD stage IIIa, followed by nephrology  Recommendations  1.  Agree with current therapy 2.  Continue diuresis 3.  Carefully monitor renal status 4.  Consider adding SGLTi   Caila Cirelli, MD, PhD, FACC 11/08/2024 8:43 AM

## 2024-11-09 DIAGNOSIS — E440 Moderate protein-calorie malnutrition: Secondary | ICD-10-CM | POA: Insufficient documentation

## 2024-11-09 LAB — BASIC METABOLIC PANEL WITH GFR
Anion gap: 14 (ref 5–15)
BUN: 47 mg/dL — ABNORMAL HIGH (ref 8–23)
CO2: 27 mmol/L (ref 22–32)
Calcium: 9.1 mg/dL (ref 8.9–10.3)
Chloride: 92 mmol/L — ABNORMAL LOW (ref 98–111)
Creatinine, Ser: 1.3 mg/dL — ABNORMAL HIGH (ref 0.44–1.00)
GFR, Estimated: 44 mL/min — ABNORMAL LOW (ref >60)
Glucose, Bld: 319 mg/dL — ABNORMAL HIGH (ref 70–99)
Potassium: 3.3 mmol/L — ABNORMAL LOW (ref 3.5–5.1)
Sodium: 133 mmol/L — ABNORMAL LOW (ref 135–145)

## 2024-11-09 LAB — CBC
HCT: 30 % — ABNORMAL LOW (ref 36.0–46.0)
Hemoglobin: 9.9 g/dL — ABNORMAL LOW (ref 12.0–15.0)
MCH: 31.8 pg (ref 26.0–34.0)
MCHC: 33 g/dL (ref 30.0–36.0)
MCV: 96.5 fL (ref 80.0–100.0)
Platelets: 83 K/uL — ABNORMAL LOW (ref 150–400)
RBC: 3.11 MIL/uL — ABNORMAL LOW (ref 3.87–5.11)
RDW: 23.5 % — ABNORMAL HIGH (ref 11.5–15.5)
WBC: 4 K/uL (ref 4.0–10.5)
nRBC: 0 % (ref 0.0–0.2)

## 2024-11-09 LAB — GLUCOSE, CAPILLARY
Glucose-Capillary: 211 mg/dL — ABNORMAL HIGH (ref 70–99)
Glucose-Capillary: 308 mg/dL — ABNORMAL HIGH (ref 70–99)
Glucose-Capillary: 312 mg/dL — ABNORMAL HIGH (ref 70–99)
Glucose-Capillary: 185 mg/dL — ABNORMAL HIGH (ref 70–99)

## 2024-11-09 MED ORDER — CARVEDILOL 3.125 MG PO TABS
3.1250 mg | ORAL_TABLET | Freq: Two times a day (BID) | ORAL | Status: DC
  Administered 2024-11-09 – 2024-11-12 (×7): 3.125 mg via ORAL
  Filled 2024-11-09 (×8): qty 1

## 2024-11-09 MED ORDER — POTASSIUM CHLORIDE CRYS ER 20 MEQ PO TBCR
40.0000 meq | EXTENDED_RELEASE_TABLET | Freq: Once | ORAL | Status: AC
  Administered 2024-11-09: 40 meq via ORAL
  Filled 2024-11-09: qty 2

## 2024-11-09 MED ORDER — PANTOPRAZOLE SODIUM 40 MG PO TBEC
40.0000 mg | DELAYED_RELEASE_TABLET | Freq: Two times a day (BID) | ORAL | Status: DC
  Administered 2024-11-09 – 2024-11-18 (×19): 40 mg via ORAL
  Filled 2024-11-09 (×19): qty 1

## 2024-11-09 MED ORDER — INSULIN GLARGINE 100 UNIT/ML ~~LOC~~ SOLN
15.0000 [IU] | Freq: Every day | SUBCUTANEOUS | Status: DC
  Administered 2024-11-09 – 2024-11-17 (×9): 15 [IU] via SUBCUTANEOUS
  Filled 2024-11-09 (×9): qty 0.15

## 2024-11-09 MED ORDER — INSULIN ASPART 100 UNIT/ML IJ SOLN
5.0000 [IU] | Freq: Three times a day (TID) | INTRAMUSCULAR | Status: DC
  Administered 2024-11-09 – 2024-11-18 (×25): 5 [IU] via SUBCUTANEOUS
  Filled 2024-11-09 (×26): qty 5

## 2024-11-09 MED ORDER — METOLAZONE 2.5 MG PO TABS
2.5000 mg | ORAL_TABLET | Freq: Once | ORAL | Status: AC
  Administered 2024-11-09: 2.5 mg via ORAL
  Filled 2024-11-09: qty 1

## 2024-11-09 MED ORDER — PANTOPRAZOLE SODIUM 40 MG IV SOLR
40.0000 mg | Freq: Two times a day (BID) | INTRAVENOUS | Status: DC
  Administered 2024-11-09: 40 mg via INTRAVENOUS
  Filled 2024-11-09: qty 10

## 2024-11-09 NOTE — Plan of Care (Signed)

## 2024-11-09 NOTE — Progress Notes (Signed)
 [facility] Cardiology  SUBJECTIVE: Patient laying in bed, denies chest pain or shortness of breath   Vitals:   11/08/24 2316 11/09/24 0405 11/09/24 0500 11/09/24 0839  BP: (!) 103/51 (!) 113/48  113/61  Pulse: 66 73  76  Resp: 20 20  18   Temp: 97.9 F (36.6 C) 97.6 F (36.4 C)  (!) 97.1 F (36.2 C)  TempSrc:      SpO2: 97% 97%  100%  Weight:   71.2 kg   Height:         Intake/Output Summary (Last 24 hours) at 11/09/2024 9056 Last data filed at 11/08/2024 1920 Gross per 24 hour  Intake 700 ml  Output --  Net 700 ml      PHYSICAL EXAM  General: Well developed, well nourished, in no acute distress HEENT:  Normocephalic and atramatic Neck:  No JVD.  Lungs: Clear bilaterally to auscultation and percussion. Heart: HRRR . Normal S1 and S2 without gallops or murmurs.  Abdomen: Bowel sounds are positive, abdomen soft and non-tender  Msk:  Back normal, normal gait. Normal strength and tone for age. Extremities: No clubbing, cyanosis or edema.   Neuro: Alert and oriented X 3. Psych:  Good affect, responds appropriately   LABS: Basic Metabolic Panel: Recent Labs    11/08/24 0424 11/09/24 0402  NA 135 133*  K 2.7* 3.3*  CL 93* 92*  CO2 28 27  GLUCOSE 208* 319*  BUN 43* 47*  CREATININE 1.27* 1.30*  CALCIUM  8.7* 9.1   Liver Function Tests: No results for input(s): AST, ALT, ALKPHOS, BILITOT, PROT, ALBUMIN  in the last 72 hours. No results for input(s): LIPASE, AMYLASE in the last 72 hours. CBC: Recent Labs    11/08/24 0424 11/09/24 0402  WBC 2.6* 4.0  HGB 9.3* 9.9*  HCT 28.3* 30.0*  MCV 96.3 96.5  PLT 71* 83*   Cardiac Enzymes: No results for input(s): CKTOTAL, CKMB, CKMBINDEX, TROPONINI in the last 72 hours. BNP: Invalid input(s): POCBNP D-Dimer: No results for input(s): DDIMER in the last 72 hours. Hemoglobin A1C: No results for input(s): HGBA1C in the last 72 hours. Fasting Lipid Panel: No results for input(s): CHOL, HDL,  LDLCALC, TRIG, CHOLHDL, LDLDIRECT in the last 72 hours. Thyroid  Function Tests: No results for input(s): TSH, T4TOTAL, T3FREE, THYROIDAB in the last 72 hours.  Invalid input(s): FREET3 Anemia Panel: No results for input(s): VITAMINB12, FOLATE, FERRITIN, TIBC, IRON , RETICCTPCT in the last 72 hours.  No results found.   Echo LVEF 35-40%, biatrial enlargement, status post mitral clip, severe tricuspid regurgitation  TELEMETRY: Sinus rhythm 68 bpm:  ASSESSMENT AND PLAN:  Principal Problem:   Hemorrhagic shock (HCC) Active Problems:   Atrial fibrillation, chronic (HCC)   Type 2 diabetes mellitus (HCC)   OSA on CPAP   CAD with hx of CABG   Cirrhosis and Portal hypertension  on CT(HCC)   Chronic systolic CHF (congestive heart failure) (HCC) - LVEF 35 to 40%   Hypothyroidism   Acute renal failure   Ileostomy in place [facility])   Esophageal varices without bleeding (HCC)   Protein calorie malnutrition   Shock (HCC)   Acute GI bleeding   Chronic kidney disease, stage 3b (HCC)   Leg edema   Malnutrition of moderate degree    1.  Acute on chronic HFrEF, LVEF 35-40%, clinically improved on furosemide  40 mg IV twice daily, unable to tolerate good medical management due to low blood pressure, on midodrine  2.  MitraClip 3.  Chronic atrial fibrillation, with controlled  rate, unable to tolerate chronic anticoagulation 4.  GI bleed, hemorrhagic shock, hemoglobin and hematocrit 9.3 and 28.3, respectively 5.  CKD stage IIIa, followed by nephrology   Recommendations   1.  Agree with current therapy 2.  Continue diuresis 3.  Carefully monitor renal status 4.  Consider adding SGLTi   Lavoris Sparling, MD, PhD, FACC 11/09/2024 9:43 AM

## 2024-11-09 NOTE — Progress Notes (Signed)
 [facility]  - [place] [facility]   PATIENT NAME: Tammy Arias    MR#:  [id number]  DATE OF BIRTH:  1956/08/13  SUBJECTIVE:  no family at bedside. Patient sitting up at the bedside. She is very cheerful. T Still has leg edema will give one more dose of Metolazone  today d/w Dr RONDEL   CATHAY:  Blood pressure 113/61, pulse 76, temperature (!) 97.1 F (36.2 C), resp. rate 18, height 5' 1 (1.549 m), weight 71.2 kg, SpO2 100%.  PHYSICAL EXAMINATION:   GENERAL:  68 y.o.-year-old patient with no acute distress.  LUNGS: Normal breath sounds bilaterally, no wheezing CARDIOVASCULAR: S1, S2 normal. No murmur   ABDOMEN: Soft, colostomy + EXTREMITIES: ++ edema b/l.    NEUROLOGIC: nonfocal  patient is alert and awake SKIN: No obvious rash, lesion, or ulcer.   LABORATORY PANEL:  CBC Recent Labs  Lab 11/09/24 0402  WBC 4.0  HGB 9.9*  HCT 30.0*  PLT 83*    Chemistries  Recent Labs  Lab 11/05/24 0330 11/06/24 0355 11/09/24 0402  NA 134*   < > 133*  K 4.2   < > 3.3*  CL 97*   < > 92*  CO2 27   < > 27  GLUCOSE 87   < > 319*  BUN 27*   < > 47*  CREATININE 1.23*   < > 1.30*  CALCIUM  9.1   < > 9.1  MG 2.2  --   --    < > = values in this interval not displayed.   Assessment and Plan 68 y.o.female with medical problems of heart failure with reduced ejection fraction 25 to 30%, severe mitral regurgitation, moderate right ventricular systolic dysfunction, severe tricuspid regurgitation, atrial fibrillation, failed Watchman device, chronic anticoagulation with apixaban , GI bleed, coronary disease status post CABG in 2005, aortic stenosis repair, pulmonary hypertension, type 2 diabetes, hypertension, liver cirrhosis with grade 2 esophageal varices, C. 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      She reports to ED for generalized malaise and is being admitted for hypotension +GIB from ileostomy  Pt has been admitted  fro >10 days--follow daily notes for detail progress  Acute on chronic HFrEF Severe bilateral leg edema Recent tte ef 35-40, moderate MR (hx mitral valve repair), severe TR. Volume overloaded now after resuscitation - continue IV diuresis - home  jardiance , nadolol on hold 2/2 hypotension --resume coreg   AKI on ckd 3a Required crrt 12/1-12/3, Cr ~ 1.35 - Continue IV Lasix , s/p 1 dose Metolazone  2.5mg  past wed - monitor Cr - nephrology following --good UOP ----12/13--metolazone  today x1 2.5 mg. D/w Dr RONDEL   Circulatory shock Multifactorial 2/2 chf, bleeding, possible adrenal insufficiency - continue midodrine    Adrenal insufficiency? Has been treated for this in the past. Started on stress-dose steroids 12/8--completed   GI bleeding History AVMs Esophageal varices History recurrent GI bleeds, thorough recent GI evaluation not much they can offer, current bleed thought to be 2/2 bleeding from ostomy now controlled. Received total of 3 units thus far this hospitalization. - monitor   Hyponatremia - improving - On IV lasix , Nephrology following   A-fib Rate controlled, not anticoagulated 2/2 recurrent bleeds. 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 No chest pain - resume home statin   OSA Declines cpap in hospital as says can only use nasal cushion   Debility Refuses snf but agreeable to home health   T2DM Steroid-induced hyperglycemia - reduce frequency of ssi from q4 to ac and hs - Inc glargine 15 units, add NovoLog  5 units tid   Gout - home allopurinol    Moderate Malnutrition - RD following      Procedures: Family communication :none today Consults  Nephrology, cardiology--[facility] CODE STATUS: DNR DVT Prophylaxis :scd Level of care: Progressive Status is: Inpatient Remains inpatient appropriate because: IV diuresis    TOTAL TIME TAKING CARE OF THIS PATIENT: 35 minutes.  >50% time spent on counselling and coordination of care  Note: This dictation was prepared with Dragon dictation along with smaller phrase technology. Any transcriptional errors that result from this process are unintentional.  Leita Blanch M.D    [facility]   CC: Primary care physician; Valora Lynwood FALCON, MD

## 2024-11-09 NOTE — Progress Notes (Signed)
 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 She reports to ED for generalized malaise and is being admitted for Hemorrhagic shock (HCC) [R57.8] Shock (HCC) [R57.9] Acute GI bleeding [K92.2] Acute renal failure, unspecified acute renal failure type [N17.9]  Update:  Patient still has significant lower extremity edema. Renal function remains relatively stable with BUN of 47 creatinine 1.3. Patient given metolazone  today.   Objective:  Vital signs in last 24 hours:  Temp:  [97.1 F (36.2 C)-99 F (37.2 C)] 98 F (36.7 C) (12/13 1237) Pulse Rate:  [66-87] 86 (12/13 1237) Resp:  [18-20] 18 (12/13 1237) BP: (103-125)/(48-63) 119/63 (12/13 1237) SpO2:  [97 %-100 %] 99 % (12/13 1237) Weight:  [71.2 kg] 71.2 kg (12/13 0500)  Weight change: -1.3 kg Filed Weights   11/07/24 0500 11/08/24 0500 11/09/24 0500  Weight: 73 kg 72.5 kg 71.2 kg    Intake/Output: I/O last 3 completed shifts: In: 920.3 [P.O.:720; IV Piggyback:200.3] Out: 2830 [Urine:2700; Stool:130]   Intake/Output this shift:  Total I/O In: 240 [P.O.:240] Out: -   Physical Exam: General: NAD  Head: Normocephalic, atraumatic. Moist oral mucosa  Eyes: Anicteric  Lungs:  Rhonchi/wheeze, normal effort  Heart: Regular rate and rhythm  Abdomen:  Soft, nontender. Ileostomy   Extremities: 2+ peripheral edema.  Neurologic: Awake, alert,  conversant  Skin: Warm,dry, no rash  Access: None    Basic Metabolic Panel: Recent Labs  Lab 11/03/24 0555 11/04/24 0433 11/05/24 0330 11/06/24 0355 11/07/24 0354 11/08/24 0424 11/09/24 0402  NA 128* 132* 134* 128* 131* 135 133*  K 4.7 4.0 4.2 5.2* 3.8 2.7* 3.3*  CL 95* 97* 97* 94* 94* 93* 92*  CO2 25 26 27 23 24 28 27   GLUCOSE 144* 141* 87 195* 200* 208* 319*  BUN 23 24* 27* 32* 39* 43* 47*  CREATININE 1.43* 1.25* 1.23* 1.30* 1.35* 1.27* 1.30*  CALCIUM  7.4* 8.3* 9.1 9.1 9.4 8.7* 9.1  MG 1.9 1.5* 2.2  --   --   --   --   PHOS 2.0* 2.8 2.5  --   --   --   --     Liver Function Tests: No results for input(s): AST, ALT, ALKPHOS, BILITOT, PROT, ALBUMIN  in the last 168 hours.  No results for input(s): LIPASE, AMYLASE in the last 168 hours.  No results for input(s): AMMONIA in the last 168 hours.  CBC: Recent Labs  Lab 11/05/24 0330 11/06/24 0355 11/07/24 0354 11/08/24 0424 11/09/24 0402  WBC 4.6 4.1 3.4* 2.6* 4.0  HGB 9.6* 9.8* 10.0* 9.3* 9.9*  HCT 29.3* 30.2* 30.4* 28.3* 30.0*  MCV 97.7 98.4 95.9 96.3 96.5  PLT 59* 70* 67* 71* 83*    Cardiac Enzymes: No results for input(s): CKTOTAL, CKMB, CKMBINDEX, TROPONINI in the last 168 hours.  BNP: Invalid input(s): POCBNP  CBG: Recent Labs  Lab 11/08/24 1518 11/08/24 1651 11/08/24 2135 11/09/24 0835 11/09/24 1233  GLUCAP 301* 326* 192* 211* 308*    Microbiology: Results for orders placed or performed during the  hospital encounter of 10/27/24  Resp panel by RT-PCR (RSV, Flu A&B, Covid) Anterior Nasal Swab     Status: None   Collection Time: 10/27/24 11:01 AM   Specimen: Anterior Nasal Swab  Result Value Ref Range Status   SARS Coronavirus 2 by RT PCR NEGATIVE NEGATIVE Final    Comment: (NOTE) SARS-CoV-2 target nucleic acids are NOT DETECTED.  The SARS-CoV-2 RNA is generally detectable in upper respiratory specimens during the acute phase of infection. The lowest concentration  of SARS-CoV-2 viral copies this assay can detect is 138 copies/mL. A negative result does not preclude SARS-Cov-2 infection and should not be used as the sole basis for treatment or other patient management decisions. A negative result may occur with  improper specimen collection/handling, submission of specimen other than nasopharyngeal swab, presence of viral mutation(s) within the areas targeted by this assay, and inadequate number of viral copies(<138 copies/mL). A negative result must be combined with clinical observations, patient history, and epidemiological information. The expected result is Negative.  Fact Sheet for Patients:  bloggercourse.com  Fact Sheet for Healthcare Providers:  seriousbroker.it  This test is no t yet approved or cleared by the [place]  FDA and  has been authorized for detection and/or diagnosis of SARS-CoV-2 by FDA under an Emergency Use Authorization (EUA). This EUA will remain  in effect (meaning this test can be used) for the duration of the COVID-19 declaration under Section 564(b)(1) of the Act, 21 U.S.C.section 360bbb-3(b)(1), unless the authorization is terminated  or revoked sooner.       Influenza A by PCR NEGATIVE NEGATIVE Final   Influenza B by PCR NEGATIVE NEGATIVE Final    Comment: (NOTE) The Xpert Xpress SARS-CoV-2/FLU/RSV plus assay is intended as an aid in the diagnosis of influenza from Nasopharyngeal swab specimens and should not be used as a sole basis for treatment. Nasal washings and aspirates are unacceptable for Xpert Xpress SARS-CoV-2/FLU/RSV testing.  Fact Sheet for Patients: bloggercourse.com  Fact Sheet for Healthcare Providers: seriousbroker.it  This test is not yet approved or cleared by the [place]  FDA and has been authorized for detection and/or diagnosis of SARS-CoV-2 by FDA under an Emergency Use  Authorization (EUA). This EUA will remain in effect (meaning this test can be used) for the duration of the COVID-19 declaration under Section 564(b)(1) of the Act, 21 U.S.C. section 360bbb-3(b)(1), unless the authorization is terminated or revoked.     Resp Syncytial Virus by PCR NEGATIVE NEGATIVE Final    Comment: (NOTE) Fact Sheet for Patients: bloggercourse.com  Fact Sheet for Healthcare Providers: seriousbroker.it  This test is not yet approved or cleared by the [place]  FDA and has been authorized for detection and/or diagnosis of SARS-CoV-2 by FDA under an Emergency Use Authorization (EUA). This EUA will remain in effect (meaning this test can be used) for the duration of the COVID-19 declaration under Section 564(b)(1) of the Act, 21 U.S.C. section 360bbb-3(b)(1), unless the authorization is terminated or revoked.  Performed at [facility], [address] [place], [place] [address]   Blood Culture (routine x 2)     Status: None   Collection Time: 10/27/24 12:18 PM   Specimen: Right Antecubital; Blood  Result Value Ref Range Status   Specimen Description RIGHT ANTECUBITAL  Final   Special Requests   Final    BOTTLES DRAWN AEROBIC AND ANAEROBIC Blood Culture adequate volume   Culture   Final    NO GROWTH 5 DAYS Performed at [facility], [address] [place], [place] [address]    Report Status 11/01/2024 FINAL  Final  Blood Culture (routine x 2)     Status: None   Collection Time: 10/27/24 12:18 PM   Specimen: Left Antecubital; Blood  Result Value Ref Range Status   Specimen Description LEFT ANTECUBITAL  Final   Special Requests   Final    BOTTLES DRAWN AEROBIC AND ANAEROBIC Blood Culture results may not be optimal due to an inadequate volume of blood received in culture bottles   Culture   Final    NO GROWTH 5 DAYS Performed at [facility], [address] [place],  [place] [address]    Report Status 11/01/2024 FINAL  Final  MRSA Next Gen by PCR, Nasal     Status: Abnormal   Collection Time: 10/27/24  1:38 PM   Specimen: Nasal Mucosa; Nasal Swab  Result Value Ref Range Status   MRSA by PCR Next Gen DETECTED (A) NOT DETECTED Final    Comment: RESULT CALLED TO, READ BACK BY AND VERIFIED WITH: KATHERINE CLAYTON @1520  10/27/24 MJU (NOTE) The GeneXpert MRSA Assay (FDA approved for NASAL specimens only), is one component of a comprehensive MRSA colonization surveillance program. It is not intended to diagnose MRSA infection nor to guide or monitor treatment for MRSA infections. Test performance is not FDA approved in patients less than 32 years old. Performed at [facility], [address] [place], [place] [address]     Coagulation Studies: No results for input(s): LABPROT, INR in the last 72 hours.   Urinalysis: No results for input(s): COLORURINE, LABSPEC, PHURINE, GLUCOSEU, HGBUR, BILIRUBINUR, KETONESUR, PROTEINUR, UROBILINOGEN, NITRITE, LEUKOCYTESUR in the last 72 hours.  Invalid input(s): APPERANCEUR     Imaging: No results found.     Medications:      allopurinol   300 mg Oral Daily   alteplase   2 mg Intracatheter Once   carvedilol   3.125 mg Oral BID WC   Chlorhexidine  Gluconate Cloth  6 each Topical Nightly   feeding supplement (GLUCERNA SHAKE)  237 mL Oral TID BM   furosemide   40 mg Intravenous BID   insulin  aspart  0-15 Units Subcutaneous TID WC   insulin  aspart  0-5 Units Subcutaneous QHS   insulin  aspart  5 Units Subcutaneous TID WC   insulin  glargine  15 Units Subcutaneous QHS   latanoprost   1 drop Both Eyes QHS   levothyroxine   50 mcg Oral QAC breakfast   midodrine   15 mg Oral TID WC   multivitamin with minerals  1 tablet Oral Daily   pantoprazole   40 mg Oral BID   phytonadione   2.5 mg Oral Daily   rosuvastatin   5 mg Oral Daily   sodium chloride  flush  3 mL Intravenous Q12H    spironolactone   25 mg Oral BID   acetaminophen , docusate sodium , ipratropium-albuterol , ondansetron  (ZOFRAN ) IV, mouth rinse, polyethylene glycol, sodium chloride  flush, traZODone   Assessment/ Plan:  Ms. Tammy Arias is a 68 y.o.  female presents to ED with weakness and has been admitted for Hemorrhagic shock (HCC) [R57.8] Shock (HCC) [R57.9] Acute GI bleeding [K92.2] Acute renal failure, unspecified acute renal failure type [N17.9]   Acute Kidney Injury - Patient has baseline creatinine 0.92 on 08/22/24.  Acute kidney injury appears multifactorial at this time from prior hypotension and dehydration. Potassium 6.1 on admission. Corrected with shifting measures.  Required CRRT from 12/1 to 12/3.  Renal function relatively stable with creatinine of 1.3.  Continue to monitor and avoid  nephrotoxins as possible.  Lab Results  Component Value Date   CREATININE 1.30 (H) 11/09/2024   CREATININE 1.27 (H) 11/08/2024   CREATININE 1.35 (H) 11/07/2024    Intake/Output Summary (Last 24 hours) at 11/09/2024 1255 Last data filed at 11/09/2024 1027 Gross per 24 hour  Intake 580 ml  Output --  Net 580 ml    2. Hypokalemia Likely secondary to aggressive IV diuresis. Has received IV replacement.  Also started on spironolactone .  3. LE edema Patient received Metolazone  2.5mg  on Wednesday. Good response.  Maintained on IV Lasix .  Spironolactone  also added.  Patient also given metolazone  today.   4. Anemia with suspected acute blood loss Lab Results  Component Value Date   HGB 9.9 (L) 11/09/2024  Has received blood transfusions during this admission. GI has no plans to repeat EGD due to current and recent workups.     LOS: 13 Kizzi Overbey 12/13/202512:55 PM

## 2024-11-09 NOTE — Progress Notes (Signed)
 Mobility Specialist - Progress Note  Pre-mobility: HR-78,  SpO2-99%  During mobility: HR-94,  SpO2-98%  Post-mobility: HR-110, , SPO2-98%   11/09/24 1300  Mobility  Activity Ambulated with assistance;Respositioned in chair  Level of Assistance Independent after set-up  Assistive Device Front wheel walker  Distance Ambulated (ft) 360 ft  Range of Motion/Exercises All extremities  Activity Response Tolerated well  Mobility visit 1 Mobility  Mobility Specialist Start Time (ACUTE ONLY) 1246  Mobility Specialist Stop Time (ACUTE ONLY) 1300  Mobility Specialist Time Calculation (min) (ACUTE ONLY) 14 min   Pt was in the recliner with nurse in the room receiving medication on RA. Pt agreed to mobility. Pt is able today to STS independently with 2 WW. Pt ambulated well today. Pt was able to maintain conversation while ambulating. Pt used recovery break to check vitals. Pt O2 vitals and HR vitals were checked throughout activity as a precaution and remained WNL. After activity pt returned to the recliner with needs in reach.  Clem Rodes Mobility Specialist 11/09/2024, 1:26 PM

## 2024-11-10 DIAGNOSIS — I5022 Chronic systolic (congestive) heart failure: Secondary | ICD-10-CM | POA: Diagnosis not present

## 2024-11-10 DIAGNOSIS — K766 Portal hypertension: Secondary | ICD-10-CM | POA: Diagnosis not present

## 2024-11-10 DIAGNOSIS — N179 Acute kidney failure, unspecified: Secondary | ICD-10-CM | POA: Diagnosis not present

## 2024-11-10 DIAGNOSIS — E440 Moderate protein-calorie malnutrition: Secondary | ICD-10-CM | POA: Diagnosis not present

## 2024-11-10 LAB — BASIC METABOLIC PANEL WITH GFR
Anion gap: 14 (ref 5–15)
BUN: 50 mg/dL — ABNORMAL HIGH (ref 8–23)
CO2: 29 mmol/L (ref 22–32)
Calcium: 8.9 mg/dL (ref 8.9–10.3)
Chloride: 93 mmol/L — ABNORMAL LOW (ref 98–111)
Creatinine, Ser: 1.38 mg/dL — ABNORMAL HIGH (ref 0.44–1.00)
GFR, Estimated: 41 mL/min — ABNORMAL LOW (ref >60)
Glucose, Bld: 115 mg/dL — ABNORMAL HIGH (ref 70–99)
Potassium: 2.8 mmol/L — ABNORMAL LOW (ref 3.5–5.1)
Sodium: 136 mmol/L (ref 135–145)

## 2024-11-10 LAB — GLUCOSE, CAPILLARY
Glucose-Capillary: 98 mg/dL (ref 70–99)
Glucose-Capillary: 249 mg/dL — ABNORMAL HIGH (ref 70–99)
Glucose-Capillary: 243 mg/dL — ABNORMAL HIGH (ref 70–99)
Glucose-Capillary: 156 mg/dL — ABNORMAL HIGH (ref 70–99)

## 2024-11-10 LAB — CBC
HCT: 29.1 % — ABNORMAL LOW (ref 36.0–46.0)
Hemoglobin: 9.6 g/dL — ABNORMAL LOW (ref 12.0–15.0)
MCH: 31.6 pg (ref 26.0–34.0)
MCHC: 33 g/dL (ref 30.0–36.0)
MCV: 95.7 fL (ref 80.0–100.0)
Platelets: 95 K/uL — ABNORMAL LOW (ref 150–400)
RBC: 3.04 MIL/uL — ABNORMAL LOW (ref 3.87–5.11)
RDW: 23.5 % — ABNORMAL HIGH (ref 11.5–15.5)
WBC: 4.2 K/uL (ref 4.0–10.5)
nRBC: 0 % (ref 0.0–0.2)

## 2024-11-10 MED ORDER — POTASSIUM CHLORIDE CRYS ER 20 MEQ PO TBCR
40.0000 meq | EXTENDED_RELEASE_TABLET | Freq: Three times a day (TID) | ORAL | Status: AC
  Administered 2024-11-10 (×3): 40 meq via ORAL
  Filled 2024-11-10 (×3): qty 2

## 2024-11-10 MED ORDER — MAGNESIUM HYDROXIDE 400 MG/5ML PO SUSP: 30.0000 mL | Freq: Every day | ORAL | Status: DC | PRN

## 2024-11-10 MED ORDER — ALUM & MAG HYDROXIDE-SIMETH 200-200-20 MG/5ML PO SUSP
30.0000 mL | ORAL | Status: DC | PRN
  Administered 2024-11-10: 30 mL via ORAL
  Filled 2024-11-10: qty 30

## 2024-11-10 NOTE — Plan of Care (Signed)

## 2024-11-10 NOTE — Progress Notes (Signed)
 [facility]  - [place] [facility]   PATIENT NAME: Tammy Arias    MR#:  [id number]  DATE OF BIRTH:  1955/12/15  SUBJECTIVE:  no family at bedside. Patient sitting up at the bedside. Nausea today  VITALS:  Blood pressure 112/66, pulse 67, temperature (!) 97.5 F (36.4 C), resp. rate 18, height 5' 1 (1.549 m), weight 71.2 kg, SpO2 100%.  PHYSICAL EXAMINATION:   GENERAL:  68 y.o.-year-old patient with no acute distress.  LUNGS: Normal breath sounds bilaterally, no wheezing CARDIOVASCULAR: S1, S2 normal. No murmur   ABDOMEN: Soft, colostomy + EXTREMITIES: ++ edema b/l.    NEUROLOGIC: nonfocal  patient is alert and awake SKIN: No obvious rash, lesion, or ulcer.   LABORATORY PANEL:  CBC Recent Labs  Lab 11/10/24 0346  WBC 4.2  HGB 9.6*  HCT 29.1*  PLT 95*    Chemistries  Recent Labs  Lab 11/05/24 0330 11/06/24 0355 11/10/24 0346  NA 134*   < > 136  K 4.2   < > 2.8*  CL 97*   < > 93*  CO2 27   < > 29  GLUCOSE 87   < > 115*  BUN 27*   < > 50*  CREATININE 1.23*   < > 1.38*  CALCIUM  9.1   < > 8.9  MG 2.2  --   --    < > = values in this interval not displayed.   Assessment and Plan 68 y.o.female with medical problems of heart failure with reduced ejection fraction 25 to 30%, severe mitral regurgitation, moderate right ventricular systolic dysfunction, severe tricuspid regurgitation, atrial fibrillation, failed Watchman device, chronic anticoagulation with apixaban , GI bleed, coronary disease status post CABG in 2005, aortic stenosis repair, pulmonary hypertension, type 2 diabetes, hypertension, liver cirrhosis with grade 2 esophageal varices, C. 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      She reports to ED for generalized malaise and is being admitted for hypotension +GIB from ileostomy  Pt has been admitted fro >10 days--follow daily notes for detail progress  Acute on chronic HFrEF Severe  bilateral leg edema Recent tte ef 35-40, moderate MR (hx mitral valve repair), severe TR. Volume overloaded now after resuscitation - continue IV diuresis - home  jardiance , nadolol on hold 2/2 hypotension --resume coreg   AKI on ckd 3a Required crrt 12/1-12/3, Cr ~ 1.35 - Continue IV Lasix , s/p 1 dose Metolazone  2.5mg  past wed - monitor Cr - nephrology following --good UOP ----12/13--metolazone  today x1 2.5 mg. D/w Dr RONDEL   Circulatory shock Multifactorial 2/2 chf, bleeding, possible adrenal insufficiency - continue midodrine    Adrenal insufficiency? Has been treated for this in the past. Started on stress-dose steroids 12/8--completed   GI bleeding History AVMs Esophageal varices History recurrent GI bleeds, thorough recent GI evaluation not much they can offer, current bleed thought to be 2/2 bleeding from ostomy now controlled. Received total of 3 units thus far this hospitalization. - monitor   Hyponatremia - improving - On IV lasix , Nephrology following   A-fib Rate controlled, not anticoagulated 2/2 recurrent bleeds. S/p watchman procedure   Thrombocytopenia - Monitor platelet count   Cirrhosis Vitamin K deficeincy Small stable ascites on 12/1 ct - diurese as above --on daily vitamin K po   Ileostomy status History c diff colitis S/p colectomy for c diff fulminant colitis - standard ostomy care   Hypothyroid - home levothyroxine    CAD Hx cabg. No chest  pain - resume home statin   OSA Declines cpap in hospital as says can only use nasal cushion   Debility Refuses snf but agreeable to home health   T2DM Steroid-induced hyperglycemia - reduce frequency of ssi from q4 to ac and hs - Inc glargine 15 units, add NovoLog  5 units tid   Gout - home allopurinol    Moderate Malnutrition - RD following      Procedures: Family communication :none today Consults Nephrology, cardiology--[facility] CODE STATUS: DNR DVT Prophylaxis :scd Level of care:  Progressive Status is: Inpatient Remains inpatient appropriate because: IV diuresis    TOTAL TIME TAKING CARE OF THIS PATIENT: 35 minutes.  >50% time spent on counselling and coordination of care  Note: This dictation was prepared with Dragon dictation along with smaller phrase technology. Any transcriptional errors that result from this process are unintentional.  Leita Blanch M.D    [facility]   CC: Primary care physician; Valora Lynwood FALCON, MD

## 2024-11-10 NOTE — Progress Notes (Signed)
 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 She reports to ED for generalized malaise and is being admitted for Hemorrhagic shock (HCC) [R57.8] Shock (HCC) [R57.9] Acute GI bleeding [K92.2] Acute renal failure, unspecified acute renal failure type [N17.9]  Update:  Continues to have lower extremity edema though does not appear to be quite as tight.  Renal function relatively stable with creatinine of 1.3.  Potassium a bit low at 2.8.    Objective:  Vital signs in last 24 hours:  Temp:  [97.5 F (36.4 C)-98.7 F (37.1 C)] 97.5 F (36.4 C) (12/14 1140) Pulse Rate:  [65-82] 67 (12/14 1140) Resp:  [16-18] 18 (12/14 1140) BP: (102-116)/(47-66) 112/66 (12/14 1140) SpO2:  [97 %-100 %] 100 % (12/14 1140) Weight:  [71.2 kg] 71.2 kg (12/14 0500)  Weight change: 0 kg Filed Weights   11/08/24 0500 11/09/24 0500 11/10/24 0500  Weight: 72.5 kg 71.2 kg 71.2 kg    Intake/Output: I/O last 3 completed shifts: In: 720 [P.O.:720] Out: 2400 [Urine:2200; Stool:200]   Intake/Output this shift:  Total I/O In: -  Out: 1000 [Urine:550; Stool:450]  Physical Exam: General: NAD  Head: Normocephalic, atraumatic. Moist oral mucosa  Eyes: Anicteric  Lungs:  Rhonchi/wheeze, normal effort  Heart: Regular rate and rhythm  Abdomen:  Soft, nontender. Ileostomy   Extremities: 2+ peripheral edema.  Neurologic: Awake,  alert, conversant  Skin: Warm,dry, no rash  Access: None    Basic Metabolic Panel: Recent Labs  Lab 11/04/24 0433 11/05/24 0330 11/06/24 0355 11/07/24 0354 11/08/24 0424 11/09/24 0402 11/10/24 0346  NA 132* 134* 128* 131* 135 133* 136  K 4.0 4.2 5.2* 3.8 2.7* 3.3* 2.8*  CL 97* 97* 94* 94* 93* 92* 93*  CO2 26 27 23 24 28 27 29   GLUCOSE 141* 87 195* 200* 208* 319* 115*  BUN 24* 27* 32* 39* 43* 47* 50*  CREATININE 1.25* 1.23* 1.30* 1.35* 1.27* 1.30* 1.38*  CALCIUM  8.3* 9.1 9.1 9.4 8.7* 9.1 8.9  MG 1.5* 2.2  --   --   --   --   --   PHOS 2.8 2.5  --   --   --   --   --     Liver Function Tests: No results for input(s): AST, ALT, ALKPHOS, BILITOT, PROT, ALBUMIN  in the last 168 hours.  No results for input(s): LIPASE, AMYLASE in the last 168 hours.  No results for input(s): AMMONIA in the last 168 hours.  CBC: Recent Labs  Lab 11/06/24 0355 11/07/24 0354 11/08/24 0424 11/09/24 0402 11/10/24 0346  WBC 4.1 3.4* 2.6* 4.0 4.2  HGB 9.8* 10.0* 9.3* 9.9* 9.6*  HCT 30.2* 30.4* 28.3* 30.0* 29.1*  MCV 98.4 95.9 96.3 96.5 95.7  PLT 70* 67* 71* 83* 95*    Cardiac Enzymes: No results for input(s): CKTOTAL, CKMB, CKMBINDEX, TROPONINI in the last 168 hours.  BNP: Invalid input(s): POCBNP  CBG: Recent Labs  Lab 11/09/24 1233 11/09/24 1735 11/09/24 2100 11/10/24 0842 11/10/24 1205  GLUCAP 308* 312* 185* 98  249*    Microbiology: Results for orders placed or performed during the hospital encounter of 10/27/24  Resp panel by RT-PCR (RSV, Flu A&B, Covid) Anterior Nasal Swab     Status: None   Collection Time: 10/27/24 11:01 AM   Specimen: Anterior Nasal Swab  Result Value Ref Range Status   SARS Coronavirus 2 by RT PCR NEGATIVE NEGATIVE Final    Comment: (NOTE) SARS-CoV-2 target nucleic acids are NOT DETECTED.  The SARS-CoV-2 RNA is generally detectable in upper respiratory specimens during the acute phase of infection. The  lowest concentration of SARS-CoV-2 viral copies this assay can detect is 138 copies/mL. A negative result does not preclude SARS-Cov-2 infection and should not be used as the sole basis for treatment or other patient management decisions. A negative result may occur with  improper specimen collection/handling, submission of specimen other than nasopharyngeal swab, presence of viral mutation(s) within the areas targeted by this assay, and inadequate number of viral copies(<138 copies/mL). A negative result must be combined with clinical observations, patient history, and epidemiological information. The expected result is Negative.  Fact Sheet for Patients:  bloggercourse.com  Fact Sheet for Healthcare Providers:  seriousbroker.it  This test is no t yet approved or cleared by the [place]  FDA and  has been authorized for detection and/or diagnosis of SARS-CoV-2 by FDA under an Emergency Use Authorization (EUA). This EUA will remain  in effect (meaning this test can be used) for the duration of the COVID-19 declaration under Section 564(b)(1) of the Act, 21 U.S.C.section 360bbb-3(b)(1), unless the authorization is terminated  or revoked sooner.       Influenza A by PCR NEGATIVE NEGATIVE Final   Influenza B by PCR NEGATIVE NEGATIVE Final    Comment: (NOTE) The Xpert Xpress SARS-CoV-2/FLU/RSV plus assay is intended as an aid in the diagnosis of influenza from Nasopharyngeal swab specimens and should not be used as a sole basis for treatment. Nasal washings and aspirates are unacceptable for Xpert Xpress SARS-CoV-2/FLU/RSV testing.  Fact Sheet for Patients: bloggercourse.com  Fact Sheet for Healthcare Providers: seriousbroker.it  This test is not yet approved or cleared by the [place]  FDA and has been authorized for detection and/or diagnosis of SARS-CoV-2 by FDA under  an Emergency Use Authorization (EUA). This EUA will remain in effect (meaning this test can be used) for the duration of the COVID-19 declaration under Section 564(b)(1) of the Act, 21 U.S.C. section 360bbb-3(b)(1), unless the authorization is terminated or revoked.     Resp Syncytial Virus by PCR NEGATIVE NEGATIVE Final    Comment: (NOTE) Fact Sheet for Patients: bloggercourse.com  Fact Sheet for Healthcare Providers: seriousbroker.it  This test is not yet approved or cleared by the [place]  FDA and has been authorized for detection and/or diagnosis of SARS-CoV-2 by FDA under an Emergency Use Authorization (EUA). This EUA will remain in effect (meaning this test can be used) for the duration of the COVID-19 declaration under Section 564(b)(1) of the Act, 21 U.S.C. section 360bbb-3(b)(1), unless the authorization is terminated or revoked.  Performed at [facility], [address] [place], [place] [address]   Blood Culture (routine x 2)     Status: None   Collection Time: 10/27/24 12:18 PM   Specimen: Right Antecubital; Blood  Result Value Ref Range Status   Specimen Description RIGHT ANTECUBITAL  Final   Special Requests   Final    BOTTLES DRAWN AEROBIC AND ANAEROBIC Blood Culture adequate volume   Culture   Final  NO GROWTH 5 DAYS Performed at [facility], [address] [place], [place] [address]    Report Status 11/01/2024 FINAL  Final  Blood Culture (routine x 2)     Status: None   Collection Time: 10/27/24 12:18 PM   Specimen: Left Antecubital; Blood  Result Value Ref Range Status   Specimen Description LEFT ANTECUBITAL  Final   Special Requests   Final    BOTTLES DRAWN AEROBIC AND ANAEROBIC Blood Culture results may not be optimal due to an inadequate volume of blood received in culture bottles   Culture   Final    NO GROWTH 5 DAYS Performed at [facility], [address] [place], [place] [address]    Report Status 11/01/2024 FINAL  Final  MRSA Next Gen by PCR, Nasal     Status: Abnormal   Collection Time: 10/27/24  1:38 PM   Specimen: Nasal Mucosa; Nasal Swab  Result Value Ref Range Status   MRSA by PCR Next Gen DETECTED (A) NOT DETECTED Final    Comment: RESULT CALLED TO, READ BACK BY AND VERIFIED WITH: KATHERINE CLAYTON @1520  10/27/24 MJU (NOTE) The GeneXpert MRSA Assay (FDA approved for NASAL specimens only), is one component of a comprehensive MRSA colonization surveillance program. It is not intended to diagnose MRSA infection nor to guide or monitor treatment for MRSA infections. Test performance is not FDA approved in patients less than 65 years old. Performed at [facility], [address] [place], [place] [address]     Coagulation Studies: No results for input(s): LABPROT, INR in the last 72 hours.   Urinalysis: No results for input(s): COLORURINE, LABSPEC, PHURINE, GLUCOSEU, HGBUR, BILIRUBINUR, KETONESUR, PROTEINUR, UROBILINOGEN, NITRITE, LEUKOCYTESUR in the last 72 hours.  Invalid input(s): APPERANCEUR     Imaging: No results found.     Medications:      allopurinol   300 mg Oral Daily   alteplase   2 mg Intracatheter Once   carvedilol   3.125 mg Oral BID WC   Chlorhexidine  Gluconate Cloth  6 each Topical Nightly   feeding supplement (GLUCERNA SHAKE)  237 mL Oral TID BM   furosemide   40 mg Intravenous BID   insulin  aspart  0-15 Units Subcutaneous TID WC   insulin  aspart  0-5 Units Subcutaneous QHS   insulin  aspart  5 Units Subcutaneous TID WC   insulin  glargine  15 Units Subcutaneous QHS   latanoprost   1 drop Both Eyes QHS   levothyroxine   50 mcg Oral QAC breakfast   midodrine   15 mg Oral TID WC   multivitamin with minerals  1 tablet Oral Daily   pantoprazole   40 mg Oral BID   phytonadione   2.5 mg Oral Daily   potassium chloride   40 mEq Oral TID   rosuvastatin   5 mg Oral Daily    sodium chloride  flush  3 mL Intravenous Q12H   spironolactone   25 mg Oral BID   acetaminophen , alum & mag hydroxide-simeth, docusate sodium , ipratropium-albuterol , magnesium  hydroxide, ondansetron  (ZOFRAN ) IV, mouth rinse, polyethylene glycol, sodium chloride  flush, traZODone   Assessment/ Plan:  Ms. Tammy Arias is a 68 y.o.  female presents to ED with weakness and has been admitted for Hemorrhagic shock (HCC) [R57.8] Shock (HCC) [R57.9] Acute GI bleeding [K92.2] Acute renal failure, unspecified acute renal failure type [N17.9]   Acute Kidney Injury - Patient has baseline creatinine 0.92 on 08/22/24.  Acute kidney injury appears multifactorial at this time from prior hypotension and dehydration. Potassium 6.1 on admission. Corrected  with shifting measures.  Required CRRT from 12/1 to 12/3.  Creatinine about the same at 1.38 however BUN slightly higher at 50.  Continue to monitor renal parameters closely.  Has been receiving diuretic therapy.  Lab Results  Component Value Date   CREATININE 1.38 (H) 11/10/2024   CREATININE 1.30 (H) 11/09/2024   CREATININE 1.27 (H) 11/08/2024    Intake/Output Summary (Last 24 hours) at 11/10/2024 1316 Last data filed at 11/10/2024 0848 Gross per 24 hour  Intake 240 ml  Output 3300 ml  Net -3060 ml    2. Hypokalemia Likely secondary to aggressive IV diuresis. Patient receiving potassium chloride  40 mEq 3 times daily.  3. LE edema Received metolazone  yesterday.  Still with significant lower extremity edema.  I have advised her to elevate her feet when sitting.   4. Anemia with suspected acute blood loss Lab Results  Component Value Date   HGB 9.6 (L) 11/10/2024  Has received blood transfusions during this admission. GI has no plans to repeat EGD due to current and recent workups.     LOS: 14 Keevan Wolz 12/14/20251:16 PM

## 2024-11-10 NOTE — Progress Notes (Signed)
 [facility] Cardiology  SUBJECTIVE: Patient laying in bed, denies chest pain shortness of breath   Vitals:   11/10/24 0037 11/10/24 0419 11/10/24 0500 11/10/24 0844  BP: (!) 116/55 102/62  (!) 104/47  Pulse: 78 68  65  Resp: 16 18  17   Temp: 97.7 F (36.5 C) 98.7 F (37.1 C)  (!) 97.5 F (36.4 C)  TempSrc:      SpO2: 97% 99%  100%  Weight:   71.2 kg   Height:         Intake/Output Summary (Last 24 hours) at 11/10/2024 0850 Last data filed at 11/10/2024 0848 Gross per 24 hour  Intake 480 ml  Output 3300 ml  Net -2820 ml      PHYSICAL EXAM  General: Well developed, well nourished, in no acute distress HEENT:  Normocephalic and atramatic Neck:  No JVD.  Lungs: Clear bilaterally to auscultation and percussion. Heart: HRRR . Normal S1 and S2 without gallops or murmurs.  Abdomen: Bowel sounds are positive, abdomen soft and non-tender  Msk:  Back normal, normal gait. Normal strength and tone for age. Extremities: No clubbing, cyanosis or edema.   Neuro: Alert and oriented X 3. Psych:  Good affect, responds appropriately   LABS: Basic Metabolic Panel: Recent Labs    11/09/24 0402 11/10/24 0346  NA 133* 136  K 3.3* 2.8*  CL 92* 93*  CO2 27 29  GLUCOSE 319* 115*  BUN 47* 50*  CREATININE 1.30* 1.38*  CALCIUM  9.1 8.9   Liver Function Tests: No results for input(s): AST, ALT, ALKPHOS, BILITOT, PROT, ALBUMIN  in the last 72 hours. No results for input(s): LIPASE, AMYLASE in the last 72 hours. CBC: Recent Labs    11/09/24 0402 11/10/24 0346  WBC 4.0 4.2  HGB 9.9* 9.6*  HCT 30.0* 29.1*  MCV 96.5 95.7  PLT 83* 95*   Cardiac Enzymes: No results for input(s): CKTOTAL, CKMB, CKMBINDEX, TROPONINI in the last 72 hours. BNP: Invalid input(s): POCBNP D-Dimer: No results for input(s): DDIMER in the last 72 hours. Hemoglobin A1C: No results for input(s): HGBA1C in the last 72 hours. Fasting Lipid Panel: No results for input(s): CHOL,  HDL, LDLCALC, TRIG, CHOLHDL, LDLDIRECT in the last 72 hours. Thyroid  Function Tests: No results for input(s): TSH, T4TOTAL, T3FREE, THYROIDAB in the last 72 hours.  Invalid input(s): FREET3 Anemia Panel: No results for input(s): VITAMINB12, FOLATE, FERRITIN, TIBC, IRON , RETICCTPCT in the last 72 hours.  No results found.   Echo  LVEF 35-40%, biatrial enlargement, status post mitral clip, severe tricuspid regurgitation   TELEMETRY: Sinus rhythm 62 bpm:  ASSESSMENT AND PLAN:  Principal Problem:   Hemorrhagic shock (HCC) Active Problems:   Atrial fibrillation, chronic (HCC)   Type 2 diabetes mellitus (HCC)   OSA on CPAP   CAD with hx of CABG   Cirrhosis and Portal hypertension  on CT(HCC)   Chronic systolic CHF (congestive heart failure) (HCC) - LVEF 35 to 40%   Hypothyroidism   Acute renal failure   Ileostomy in place [facility])   Esophageal varices without bleeding (HCC)   Protein calorie malnutrition   Shock (HCC)   Acute GI bleeding   Chronic kidney disease, stage 3b (HCC)   Leg edema   Malnutrition of moderate degree    1. Acute on chronic HFrEF, LVEF 35-40%, clinically improved on furosemide  40 mg IV twice daily, unable to tolerate good medical management due to low blood pressure, on low-dose carvedilol , and on midodrine  for low blood pressure 2.  MitraClip 3.  Chronic atrial fibrillation, with controlled rate, unable to tolerate chronic anticoagulation 4.  GI bleed, hemorrhagic shock, hemoglobin and hematocrit 9.3 and 28.3, respectively 5.  CKD stage IIIa, followed by nephrology   Recommendations   1.  Agree with current therapy 2.  Continue diuresis (furosemide  40 mg IV twice daily) 3.  Carefully monitor renal status, followed by nephrology 4.  Consider adding SGLTi   Laci Frenkel, MD, PhD, FACC 11/10/2024 8:50 AM

## 2024-11-10 NOTE — Consult Note (Signed)
 PHARMACY CONSULT NOTE - ELECTROLYTES  Pharmacy Consult for Electrolyte Monitoring and Replacement   Recent Labs: Potassium (mmol/L)  Date Value  11/10/2024 2.8 (L)   Magnesium  (mg/dL)  Date Value  87/90/7974 2.2   Calcium  (mg/dL)  Date Value  87/85/7974 8.9   Albumin  (g/dL)  Date Value  87/93/7974 3.4 (L)  05/18/2020 4.7   Phosphorus (mg/dL)  Date Value  87/90/7974 2.5   Sodium (mmol/L)  Date Value  11/10/2024 136   Height: 5' 1 (154.9 cm) Weight: 71.2 kg (156 lb 15.5 oz) IBW/kg (Calculated) : 47.8 Estimated Creatinine Clearance: 34.7 mL/min (A) (by C-G formula based on SCr of 1.38 mg/dL (H)).  Assessment  Tammy Arias is a 68 y.o. female presenting with acute HFrEF. PMH significant for CHF, Afib, GI bleeding, CABG, pHTN, liver cirrhosis, C diff s/p colectomy and end ileostomy. Pharmacy has been consulted to monitor and replace electrolytes.  Diet: Carb modified MIVF: N/A Pertinent medications: Lasix  40 mg IV BID, Aldactone  25 mg BID  Goal of Therapy: Electrolytes within normal limits  Plan:  K 2.8 from 3.3 yesterday despite replacement with 40 mEq PO K+. Give Kcl 40 mEq TID x 3 doses today with on-going diuresis Re-check labs tomorrow AM  Thank you for allowing pharmacy to be a part of this patients care.  Tammy Arias 11/10/2024 8:24 AM

## 2024-11-11 DIAGNOSIS — I482 Chronic atrial fibrillation, unspecified: Secondary | ICD-10-CM

## 2024-11-11 LAB — BASIC METABOLIC PANEL WITH GFR
Anion gap: 11 (ref 5–15)
BUN: 46 mg/dL — ABNORMAL HIGH (ref 8–23)
CO2: 32 mmol/L (ref 22–32)
Calcium: 9.2 mg/dL (ref 8.9–10.3)
Chloride: 96 mmol/L — ABNORMAL LOW (ref 98–111)
Creatinine, Ser: 1.37 mg/dL — ABNORMAL HIGH (ref 0.44–1.00)
GFR, Estimated: 42 mL/min — ABNORMAL LOW (ref >60)
Glucose, Bld: 65 mg/dL — ABNORMAL LOW (ref 70–99)
Potassium: 3.5 mmol/L (ref 3.5–5.1)
Sodium: 138 mmol/L (ref 135–145)

## 2024-11-11 LAB — HEMOGLOBIN AND HEMATOCRIT, BLOOD
HCT: 28 % — ABNORMAL LOW (ref 36.0–46.0)
HCT: 24.9 % — ABNORMAL LOW (ref 36.0–46.0)
Hemoglobin: 9.1 g/dL — ABNORMAL LOW (ref 12.0–15.0)
Hemoglobin: 7.9 g/dL — ABNORMAL LOW (ref 12.0–15.0)

## 2024-11-11 LAB — GLUCOSE, CAPILLARY
Glucose-Capillary: 181 mg/dL — ABNORMAL HIGH (ref 70–99)
Glucose-Capillary: 147 mg/dL — ABNORMAL HIGH (ref 70–99)
Glucose-Capillary: 236 mg/dL — ABNORMAL HIGH (ref 70–99)
Glucose-Capillary: 114 mg/dL — ABNORMAL HIGH (ref 70–99)

## 2024-11-11 LAB — MAGNESIUM: Magnesium: 1.3 mg/dL — ABNORMAL LOW (ref 1.7–2.4)

## 2024-11-11 MED ORDER — SODIUM CHLORIDE 0.9 % IV BOLUS
500.0000 mL | Freq: Once | INTRAVENOUS | Status: AC
  Administered 2024-11-11: 22:00:00 500 mL via INTRAVENOUS

## 2024-11-11 MED ORDER — SODIUM CHLORIDE 0.9 % IV BOLUS
250.0000 mL | Freq: Once | INTRAVENOUS | Status: AC
  Administered 2024-11-11: 05:00:00 250 mL via INTRAVENOUS

## 2024-11-11 MED ORDER — MAGNESIUM SULFATE 4 GM/100ML IV SOLN
4.0000 g | Freq: Once | INTRAVENOUS | Status: AC
  Administered 2024-11-11: 09:00:00 4 g via INTRAVENOUS
  Filled 2024-11-11: qty 100

## 2024-11-11 MED ORDER — POTASSIUM CHLORIDE CRYS ER 20 MEQ PO TBCR
40.0000 meq | EXTENDED_RELEASE_TABLET | Freq: Every day | ORAL | Status: DC
  Administered 2024-11-11 – 2024-11-13 (×3): 40 meq via ORAL
  Filled 2024-11-11 (×3): qty 2

## 2024-11-11 NOTE — Consult Note (Signed)
 PHARMACY CONSULT NOTE - ELECTROLYTES  Pharmacy Consult for Electrolyte Monitoring and Replacement   Recent Labs: Potassium (mmol/L)  Date Value  11/11/2024 3.5   Magnesium  (mg/dL)  Date Value  87/84/7974 1.3 (L)   Calcium  (mg/dL)  Date Value  87/84/7974 9.2   Albumin  (g/dL)  Date Value  87/93/7974 3.4 (L)  05/18/2020 4.7   Phosphorus (mg/dL)  Date Value  87/90/7974 2.5   Sodium (mmol/L)  Date Value  11/11/2024 138   Height: 5' 1 (154.9 cm) Weight: 62.8 kg (138 lb 7.2 oz) IBW/kg (Calculated) : 47.8 Estimated Creatinine Clearance: 32.9 mL/min (A) (by C-G formula based on SCr of 1.37 mg/dL (H)).  Assessment  Tammy Arias is a 68 y.o. female presenting with acute HFrEF. PMH significant for CHF, Afib, GI bleeding, CABG, pHTN, liver cirrhosis, C diff s/p colectomy and end ileostomy. Pharmacy has been consulted to monitor and replace electrolytes.  Diet: Carb modified MIVF: N/A Pertinent medications: Lasix  40 mg IV BID, Aldactone  25 mg BID  Goal of Therapy: Electrolytes within normal limits  Plan:  Mg 4 g IV x 1 Kcl 40 mg daily while on IV lasix .  F/u with AM labs.    Thank you for allowing pharmacy to be a part of this patients care.  Zedrick Springsteen S Kaari Zeigler 11/11/2024 6:47 AM

## 2024-11-11 NOTE — Progress Notes (Signed)
 Mobility Specialist Progress Note:    11/11/24 1122  Mobility  Activity Ambulated with assistance  Level of Assistance Contact guard assist, steadying assist  Assistive Device None;Front wheel walker  Distance Ambulated (ft) 480 ft  Range of Motion/Exercises Active;All extremities  Activity Response Tolerated well  Mobility visit 1 Mobility  Mobility Specialist Start Time (ACUTE ONLY) 1052  Mobility Specialist Stop Time (ACUTE ONLY) 1113  Mobility Specialist Time Calculation (min) (ACUTE ONLY) 21 min   Pt received in chair, eager for mobility. Required CGA to stand and ambulate with no AD, utilizing hand rails for stability. Pt ambulated 80-ft with no AD and then requested RW for balance and stability. Required supervision to ambulate with RW. Returned pt to chair, all needs met.  Sherrilee Ditty Mobility Specialist Please contact via Special Educational Needs Teacher or  Rehab office at [phone]

## 2024-11-11 NOTE — Progress Notes (Signed)
 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 She reports to ED for generalized malaise and is being admitted for Hemorrhagic shock (HCC) [R57.8] Shock (HCC) [R57.9] Acute GI bleeding [K92.2] Acute renal failure, unspecified acute renal failure type [N17.9]  Update:  Renal function relatively stable with BUN of 46 and creatinine 1.37. Serum potassium improved to 3.5. Still has lower extremity edema.    Objective:  Vital signs in last 24 hours:  Temp:  [97.8 F (36.6 C)-98.4 F (36.9 C)] 98 F (36.7 C) (12/15 1614) Pulse Rate:  [61-80] 80 (12/15 1614) Resp:  [15-19] 17 (12/15 0813) BP: (93-120)/(47-56) 108/52 (12/15 1614) SpO2:  [95 %-100 %] 99 % (12/15 1614) Weight:  [62.8 kg] 62.8 kg (12/15 0500)  Weight change: -8.4 kg Filed Weights   11/09/24 0500 11/10/24 0500 11/11/24 0500  Weight: 71.2 kg 71.2 kg 62.8 kg    Intake/Output: I/O last 3 completed shifts: In: 850 [P.O.:600; IV Piggyback:250] Out: 7050 [Urine:5150; Stool:1900]   Intake/Output this shift:  Total I/O In: 962.5 [P.O.:957; IV Piggyback:5.5] Out: 1700 [Urine:1000; Stool:700]  Physical Exam: General: NAD  Head: Normocephalic, atraumatic. Moist oral mucosa  Eyes: Anicteric  Lungs:  Rhonchi/wheeze, normal effort  Heart: Regular rate and rhythm  Abdomen:  Soft, nontender. Ileostomy   Extremities: 2+ peripheral edema.   Neurologic: Awake, alert, conversant  Skin: Warm,dry, no rash  Access: None    Basic Metabolic Panel: Recent Labs  Lab 11/05/24 0330 11/06/24 0355 11/07/24 0354 11/08/24 0424 11/09/24 0402 11/10/24 0346 11/11/24 0430  NA 134*   < > 131* 135 133* 136 138  K 4.2   < > 3.8 2.7* 3.3* 2.8* 3.5  CL 97*   < > 94* 93* 92* 93* 96*  CO2 27   < > 24 28 27 29  32  GLUCOSE 87   < > 200* 208* 319* 115* 65*  BUN 27*   < > 39* 43* 47* 50* 46*  CREATININE 1.23*   < > 1.35* 1.27* 1.30* 1.38* 1.37*  CALCIUM  9.1   < > 9.4 8.7* 9.1 8.9 9.2  MG 2.2  --   --   --   --   --  1.3*  PHOS 2.5  --   --   --   --   --   --    < > = values in this interval not displayed.    Liver Function Tests: No results for input(s): AST, ALT, ALKPHOS, BILITOT, PROT, ALBUMIN  in the last 168 hours.  No results for input(s): LIPASE, AMYLASE in the last 168 hours.  No results for input(s): AMMONIA in the last 168 hours.  CBC: Recent Labs  Lab 11/06/24 0355 11/07/24 0354 11/08/24 0424 11/09/24 0402 11/10/24 0346  WBC 4.1 3.4* 2.6* 4.0 4.2  HGB 9.8* 10.0* 9.3* 9.9* 9.6*  HCT 30.2* 30.4* 28.3* 30.0* 29.1*  MCV 98.4 95.9 96.3 96.5 95.7  PLT 70* 67* 71* 83* 95*    Cardiac Enzymes: No results for input(s): CKTOTAL, CKMB, CKMBINDEX, TROPONINI in  the last 168 hours.  BNP: Invalid input(s): POCBNP  CBG: Recent Labs  Lab 11/10/24 1205 11/10/24 1631 11/10/24 2114 11/11/24 0811 11/11/24 1202  GLUCAP 249* 243* 156* 181* 147*    Microbiology: Results for orders placed or performed during the hospital encounter of 10/27/24  Resp panel by RT-PCR (RSV, Flu A&B, Covid) Anterior Nasal Swab     Status: None   Collection Time: 10/27/24 11:01 AM   Specimen: Anterior Nasal Swab  Result Value Ref Range Status   SARS Coronavirus 2 by RT PCR NEGATIVE NEGATIVE Final    Comment: (NOTE) SARS-CoV-2 target nucleic acids are NOT DETECTED.  The SARS-CoV-2 RNA is generally detectable in  upper respiratory specimens during the acute phase of infection. The lowest concentration of SARS-CoV-2 viral copies this assay can detect is 138 copies/mL. A negative result does not preclude SARS-Cov-2 infection and should not be used as the sole basis for treatment or other patient management decisions. A negative result may occur with  improper specimen collection/handling, submission of specimen other than nasopharyngeal swab, presence of viral mutation(s) within the areas targeted by this assay, and inadequate number of viral copies(<138 copies/mL). A negative result must be combined with clinical observations, patient history, and epidemiological information. The expected result is Negative.  Fact Sheet for Patients:  bloggercourse.com  Fact Sheet for Healthcare Providers:  seriousbroker.it  This test is no t yet approved or cleared by the [place]  FDA and  has been authorized for detection and/or diagnosis of SARS-CoV-2 by FDA under an Emergency Use Authorization (EUA). This EUA will remain  in effect (meaning this test can be used) for the duration of the COVID-19 declaration under Section 564(b)(1) of the Act, 21 U.S.C.section 360bbb-3(b)(1), unless the authorization is terminated  or revoked sooner.       Influenza A by PCR NEGATIVE NEGATIVE Final   Influenza B by PCR NEGATIVE NEGATIVE Final    Comment: (NOTE) The Xpert Xpress SARS-CoV-2/FLU/RSV plus assay is intended as an aid in the diagnosis of influenza from Nasopharyngeal swab specimens and should not be used as a sole basis for treatment. Nasal washings and aspirates are unacceptable for Xpert Xpress SARS-CoV-2/FLU/RSV testing.  Fact Sheet for Patients: bloggercourse.com  Fact Sheet for Healthcare Providers: seriousbroker.it  This test is not yet approved or cleared by the [place]  FDA and has been  authorized for detection and/or diagnosis of SARS-CoV-2 by FDA under an Emergency Use Authorization (EUA). This EUA will remain in effect (meaning this test can be used) for the duration of the COVID-19 declaration under Section 564(b)(1) of the Act, 21 U.S.C. section 360bbb-3(b)(1), unless the authorization is terminated or revoked.     Resp Syncytial Virus by PCR NEGATIVE NEGATIVE Final    Comment: (NOTE) Fact Sheet for Patients: bloggercourse.com  Fact Sheet for Healthcare Providers: seriousbroker.it  This test is not yet approved or cleared by the [place]  FDA and has been authorized for detection and/or diagnosis of SARS-CoV-2 by FDA under an Emergency Use Authorization (EUA). This EUA will remain in effect (meaning this test can be used) for the duration of the COVID-19 declaration under Section 564(b)(1) of the Act, 21 U.S.C. section 360bbb-3(b)(1), unless the authorization is terminated or revoked.  Performed at [facility], [address] [place], [place] [address]   Blood Culture (routine x 2)     Status: None   Collection Time: 10/27/24 12:18 PM   Specimen: Right Antecubital; Blood  Result Value Ref Range Status  Specimen Description RIGHT ANTECUBITAL  Final   Special Requests   Final    BOTTLES DRAWN AEROBIC AND ANAEROBIC Blood Culture adequate volume   Culture   Final    NO GROWTH 5 DAYS Performed at [facility], [address] [place], [place] [address]    Report Status 11/01/2024 FINAL  Final  Blood Culture (routine x 2)     Status: None   Collection Time: 10/27/24 12:18 PM   Specimen: Left Antecubital; Blood  Result Value Ref Range Status   Specimen Description LEFT ANTECUBITAL  Final   Special Requests   Final    BOTTLES DRAWN AEROBIC AND ANAEROBIC Blood Culture results may not be optimal due to an inadequate volume of blood received in culture bottles   Culture   Final    NO  GROWTH 5 DAYS Performed at [facility], [address], [place], [place] [address]    Report Status 11/01/2024 FINAL  Final  MRSA Next Gen by PCR, Nasal     Status: Abnormal   Collection Time: 10/27/24  1:38 PM   Specimen: Nasal Mucosa; Nasal Swab  Result Value Ref Range Status   MRSA by PCR Next Gen DETECTED (A) NOT DETECTED Final    Comment: RESULT CALLED TO, READ BACK BY AND VERIFIED WITH: KATHERINE CLAYTON @1520  10/27/24 MJU (NOTE) The GeneXpert MRSA Assay (FDA approved for NASAL specimens only), is one component of a comprehensive MRSA colonization surveillance program. It is not intended to diagnose MRSA infection nor to guide or monitor treatment for MRSA infections. Test performance is not FDA approved in patients less than 75 years old. Performed at [facility], [address] [place], [place] [address]     Coagulation Studies: No results for input(s): LABPROT, INR in the last 72 hours.   Urinalysis: No results for input(s): COLORURINE, LABSPEC, PHURINE, GLUCOSEU, HGBUR, BILIRUBINUR, KETONESUR, PROTEINUR, UROBILINOGEN, NITRITE, LEUKOCYTESUR in the last 72 hours.  Invalid input(s): APPERANCEUR     Imaging: No results found.     Medications:      allopurinol   300 mg Oral Daily   alteplase   2 mg Intracatheter Once   carvedilol   3.125 mg Oral BID WC   Chlorhexidine  Gluconate Cloth  6 each Topical Nightly   feeding supplement (GLUCERNA SHAKE)  237 mL Oral TID BM   furosemide   40 mg Intravenous BID   insulin  aspart  0-15 Units Subcutaneous TID WC   insulin  aspart  0-5 Units Subcutaneous QHS   insulin  aspart  5 Units Subcutaneous TID WC   insulin  glargine  15 Units Subcutaneous QHS   latanoprost   1 drop Both Eyes QHS   levothyroxine   50 mcg Oral QAC breakfast   midodrine   15 mg Oral TID WC   multivitamin with minerals  1 tablet Oral Daily   pantoprazole   40 mg Oral BID   phytonadione   2.5 mg Oral Daily    potassium chloride   40 mEq Oral Daily   rosuvastatin   5 mg Oral Daily   sodium chloride  flush  3 mL Intravenous Q12H   spironolactone   25 mg Oral BID   acetaminophen , alum & mag hydroxide-simeth, docusate sodium , ipratropium-albuterol , magnesium  hydroxide, ondansetron  (ZOFRAN ) IV, mouth rinse, polyethylene glycol, sodium chloride  flush, traZODone   Assessment/ Plan:  Ms. LAURAINE CRESPO is a 68 y.o.  female presents to ED with weakness and has been admitted for Hemorrhagic shock (HCC) [R57.8] Shock (HCC) [R57.9] Acute GI bleeding [K92.2] Acute renal failure, unspecified acute renal failure  type [N17.9]   Acute Kidney Injury - Patient has baseline creatinine 0.92 on 08/22/24.  Acute kidney injury appears multifactorial at this time from prior hypotension and dehydration. Potassium 6.1 on admission. Corrected with shifting measures.  Required CRRT from 12/1 to 12/3.  Renal function appears to have stabilized with a creatinine of 1.37.  Continue to monitor periodically.  Lab Results  Component Value Date   CREATININE 1.37 (H) 11/11/2024   CREATININE 1.38 (H) 11/10/2024   CREATININE 1.30 (H) 11/09/2024    Intake/Output Summary (Last 24 hours) at 11/11/2024 1623 Last data filed at 11/11/2024 1615 Gross per 24 hour  Intake 1452.46 ml  Output 5850 ml  Net -4397.54 ml    2. Hypokalemia Likely secondary to aggressive IV diuresis. Maintain the patient on spironolactone  and potassium chloride  40 mEq daily.  3. LE edema Appears to be less tight.  Continue Lasix  40 mg twice daily, spironolactone  25 mg twice daily and monitoring of edema status daily.   4. Anemia with suspected acute blood loss Lab Results  Component Value Date   HGB 9.6 (L) 11/10/2024  Has received blood transfusions during this admission. GI has no plans to repeat EGD due to current and recent workups.     LOS: 15 Dhani Imel 12/15/20254:23 PM

## 2024-11-11 NOTE — Progress Notes (Signed)
 Progress Note   Patient: Tammy Arias FMW:[id number] DOB: Aug 27, 1956 DOA: 10/27/2024     15 DOS: the patient was seen and examined on 11/11/2024   Brief hospital course: 68 y.o.female with medical problems of heart failure with reduced ejection fraction 25 to 30%, severe mitral regurgitation, moderate right ventricular systolic dysfunction, severe tricuspid regurgitation, atrial fibrillation, failed Watchman device, chronic anticoagulation with apixaban , GI bleed, coronary disease status post CABG in 2005, aortic stenosis repair, pulmonary hypertension, type 2 diabetes, hypertension, liver cirrhosis with grade 2 esophageal varices, C. 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     She reports to ED for generalized malaise and is being admitted for hypotension +GIB from ileostomy.  Patient initially admitted for hemorrhagic stroke got multiple IV fluid boluses, IV pressors with electrolyte derangements she required Levophed  and was in ICU.  There is a component of cardiogenic shock with extensive cardiac history.  Cardiology was consulted, weaned off pressors, doing good on IV Lasix  therapy with close monitoring of renal function by nephrology team.  Assessment and Plan: Acute on chronic HFrEF: Cardiogenic shock-resolved EF 35 to 40%, moderate MR history of mitral valve repair, severe TR. Status post volume overloaded after fluid resuscitation. Continue IV Lasix  40 mg twice daily. Once, nadolol on hold due to hypotension.  Continue midodrine  therapy. Resumed Coreg  therapy.    AKI on CKD stage III: Creatinine improved to 1.35. Continue IV Lasix , status post metolazone . Urine output improved. Continue to monitor daily renal function.  Possible adrenal insufficiency: Patient got stress dose steroids which is completed now.  GI bleeding: History of AVMs Esophageal varices History of recurrent GI bleeds- Does have on and off bleeding in the  ostomy.  She received 3 units of PRBC total. H&H remained stable.  Continue to monitor.  Hyponatremia-improving.  nephrology on board.  A-fib: Rate controlled status post Watchman procedure.  Not on anticoagulation secondary to GI bleed.  Thrombocytopenia-stable.  Cirrhosis-stable Vitamin K deficiency: On daily vitamin K p.o.  Ileostomy status- History of C. 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  Hypothyroidism-continue home levothyroxine .  Type 2 diabetes mellitus-: Continue Lantus , NovoLog  5 units 3 times daily.  OSA-not compliant with CPAP.  Debility: Refused SNF, wishes to go home with home health.       Out of bed to chair. Incentive spirometry. Nursing supportive care. Fall, aspiration precautions. Diet:  Diet Orders (From admission, onward)     Start     Ordered   11/01/24 1343  Diet Carb Modified Fluid consistency: Thin  Diet effective now       Question Answer Comment  Calorie Level Medium 1600-2000   Fluid consistency: Thin      11/01/24 1343           DVT prophylaxis: SCDs Start: 10/27/24 1241  Level of care: Progressive   Code Status: Limited: Do not attempt resuscitation (DNR) -DNR-LIMITED -Do Not Intubate/DNI   Subjective: Patient is seen and examined today morning.  She is walking in the room, states feels better still has leg swelling.  Eating fair.  Physical Exam: Vitals:   11/11/24 0548 11/11/24 0813 11/11/24 1203 11/11/24 1614  BP: (!) 100/51 (!) 120/56 (!) 98/51 (!) 108/52  Pulse: 61 71 70 80  Resp: 15 17    Temp:  97.8 F (36.6 C) 98 F (36.7 C) 98 F (36.7 C)  TempSrc:   Oral   SpO2:  100%  100% 99%  Weight:      Height:        General - Elderly Caucasian female, no apparent distress HEENT - PERRLA, EOMI, atraumatic head, non tender sinuses. Lung - Clear, diffuse rales, no rhonchi, wheezes. Heart - S1, S2 heard, no murmurs, rubs, 2+ pedal edema. Abdomen -  Soft, non tender, bowel sounds good Neuro - Alert, awake and oriented x 3, non focal exam. Skin - Warm and dry.  Data Reviewed:      Latest Ref Rng & Units 11/11/2024    4:43 PM 11/10/2024    3:46 AM 11/09/2024    4:02 AM  CBC  WBC 4.0 - 10.5 K/uL  4.2  4.0   Hemoglobin 12.0 - 15.0 g/dL 9.1  9.6  9.9   Hematocrit 36.0 - 46.0 % 28.0  29.1  30.0   Platelets 150 - 400 K/uL  95  83       Latest Ref Rng & Units 11/11/2024    4:30 AM 11/10/2024    3:46 AM 11/09/2024    4:02 AM  BMP  Glucose 70 - 99 mg/dL 65  884  680   BUN 8 - 23 mg/dL 46  50  47   Creatinine 0.44 - 1.00 mg/dL 8.62  8.61  8.69   Sodium 135 - 145 mmol/L 138  136  133   Potassium 3.5 - 5.1 mmol/L 3.5  2.8  3.3   Chloride 98 - 111 mmol/L 96  93  92   CO2 22 - 32 mmol/L 32  29  27   Calcium  8.9 - 10.3 mg/dL 9.2  8.9  9.1    No results found.  Family Communication: Discussed with patient, understand and agree. All questions answered.  Disposition: Status is: Inpatient Remains inpatient appropriate because: IV lasix , HH  Planned Discharge Destination: Home with Home Health     Time spent: 43 minutes  Author: Concepcion Riser, MD 11/11/2024 5:50 PM Secure chat 7am to 7pm For on call review [email or web address].

## 2024-11-11 NOTE — Plan of Care (Signed)

## 2024-11-11 NOTE — Progress Notes (Signed)
 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         She reports to ED for generalized malaise and is being admitted for hypotension  +GIB from ileostomy   OT comments  Tammy Arias was seen for OT treatment on this date. Upon arrival to room pt seated in recliner, agreeable to OT Tx session. OT facilitated ADL management with education and assistance as described below. See ADL section for additional details regarding occupational performance. Pt continues to be functionally limited by decreased activity tolerance & generalized weakness  Pt return verbalizes understanding of education provided t/o session. Pt is progressing toward OT goals and continues to benefit from skilled OT services to maximize return to PLOF and minimize risk of future falls, injury, and readmission. Will continue to follow POC as written. Discharge recommendation remains appropriate.        If plan is discharge home, recommend the following:  Help with stairs or ramp for entrance;A little help with bathing/dressing/bathroom   Equipment Recommendations  None recommended by OT    Recommendations for Other Services      Precautions / Restrictions Precautions Precautions: Fall Recall of Precautions/Restrictions: Intact Restrictions Weight Bearing Restrictions  Per Provider Order: No       Mobility Bed Mobility               General bed mobility comments: NT in recliner pre/post session    Transfers Overall transfer level: Needs assistance Equipment used: Rolling walker (2 wheels)               General transfer comment: sit<>stand from recliner with RW, also pushes IV pole into bathroom with SUPERVISION progressing to MOD I during session.     Balance   Sitting-balance support: Feet supported, No upper extremity supported Sitting balance-Leahy Scale: Normal     Standing balance support: During functional activity, No upper extremity supported Standing balance-Leahy Scale: Good                             ADL either performed or assessed with clinical judgement   ADL Overall ADL's : Needs assistance/impaired                         Toilet Transfer: Modified Independent Toilet Transfer Details (indicate cue type and reason): Using IV pole for support. Toileting- Clothing Manipulation and Hygiene: Modified independent;Sit to/from stand Toileting - Clothing Manipulation Details (indicate cue type and reason): Manages ostomy independently from room commode.     Functional mobility during ADLs: Supervision/safety;Rolling walker (2 wheels);Modified independent General ADL Comments: SUPERVISION to MOD I for functional mobility in room/hall. Pt educated on safe use of RW for longer distances.    Extremity/Trunk Assessment              Vision Patient Visual Report: No change from baseline     Perception     Praxis  Communication Communication Communication: No apparent difficulties   Cognition Arousal: Alert Behavior During Therapy: WFL for tasks assessed/performed Cognition: No apparent impairments                               Following commands: Intact        Cueing   Cueing Techniques: Verbal cues  Exercises Other Exercises Other Exercises: OT facilitated ADL  management with education and assistance as described above. See ADL section for additional details.    Shoulder Instructions       General Comments      Pertinent Vitals/ Pain       Pain Assessment Pain Assessment: No/denies pain  Home Living Family/patient expects to be discharged to:: Private residence Living Arrangements: Alone                                      Prior Functioning/Environment              Frequency  Min 2X/week        Progress Toward Goals  OT Goals(current goals can now be found in the care plan section)  Progress towards OT goals: Progressing toward goals  Acute Rehab OT Goals Patient Stated Goal: To go home OT Goal Formulation: With patient Time For Goal Achievement: 11/13/24 Potential to Achieve Goals: Fair ADL Goals Pt Will Perform Toileting - Clothing Manipulation and hygiene: with modified independence;sit to/from stand  Plan      Co-evaluation                 AM-PAC OT 6 Clicks Daily Activity     Outcome Measure   Help from another person eating meals?: None Help from another person taking care of personal grooming?: A Little Help from another person toileting, which includes using toliet, bedpan, or urinal?: None Help from another person bathing (including washing, rinsing, drying)?: A Little Help from another person to put on and taking off regular upper body clothing?: None Help from another person to put on and taking off regular lower body clothing?: A Little 6 Click Score: 21    End of Session Equipment Utilized During Treatment: Rolling walker (2 wheels)  OT Visit Diagnosis: Unsteadiness on feet (R26.81);Repeated falls (R29.6);Muscle weakness (generalized) (M62.81)   Activity Tolerance Patient tolerated treatment well   Patient Left in chair;with call bell/phone within reach;with family/visitor present   Nurse Communication Mobility status        Time: 8670-8650 OT Time Calculation  (min): 20 min  Charges: OT General Charges $OT Visit: 1 Visit OT Treatments $Self Care/Home Management : 8-22 mins  Jhonny Pelton, M.S., OTR/L 11/11/2024, 3:06 PM

## 2024-11-11 NOTE — Plan of Care (Signed)
°  Problem: Education: Goal: Knowledge of General Education information will improve Description: Including pain rating scale, medication(s)/side effects and non-pharmacologic comfort measures 11/11/2024 1627 by Lynnette Cena CROME, RN Outcome: Progressing 11/11/2024 1620 by Lynnette Cena CROME, RN Outcome: Progressing   Problem: Health Behavior/Discharge Planning: Goal: Ability to manage health-related needs will improve 11/11/2024 1627 by Lynnette Cena CROME, RN Outcome: Progressing 11/11/2024 1620 by Lynnette Cena CROME, RN Outcome: Progressing

## 2024-11-11 NOTE — Progress Notes (Signed)
 Mobility Specialist Progress Note:    11/11/24 1542  Mobility  Activity Ambulated with assistance  Level of Assistance Standby assist, set-up cues, supervision of patient - no hands on  Assistive Device Front wheel walker  Distance Ambulated (ft) 500 ft  Range of Motion/Exercises Active;All extremities  Activity Response Tolerated well  Mobility visit 1 Mobility  Mobility Specialist Start Time (ACUTE ONLY) 1519  Mobility Specialist Stop Time (ACUTE ONLY) 1535  Mobility Specialist Time Calculation (min) (ACUTE ONLY) 16 min   Pt received in chair, eager for mobility. Required supervision to stand and ambulate with RW. Tolerated well, asx throughout. Returned to chair, all needs met.  Sherrilee Ditty Mobility Specialist Please contact via Special Educational Needs Teacher or  Rehab office at [phone]

## 2024-11-11 NOTE — Progress Notes (Signed)
 [facility]  SUBJECTIVE: Patient laying in bed, reports shortness of breath when she ambulates to the restroom   Vitals:   11/11/24 0427 11/11/24 0500 11/11/24 0548 11/11/24 0813  BP: (!) 93/47  (!) 100/51 (!) 120/56  Pulse: 74  61 71  Resp: 19  15 17   Temp: 98 F (36.7 C)   97.8 F (36.6 C)  TempSrc: Oral     SpO2: 95%   100%  Weight:  62.8 kg    Height:         Intake/Output Summary (Last 24 hours) at 11/11/2024 9175 Last data filed at 11/11/2024 9360 Gross per 24 hour  Intake 850 ml  Output 6150 ml  Net -5300 ml      PHYSICAL EXAM  General: Well developed, well nourished, in no acute distress HEENT:  Normocephalic and atramatic Neck:  No JVD.  Lungs: Clear bilaterally to auscultation and percussion. Heart: HRRR . Normal S1 and S2 without gallops or murmurs.  Abdomen: Bowel sounds are positive, abdomen soft and non-tender  Msk:  Back normal, normal gait. Normal strength and tone for age. Extremities: No clubbing, cyanosis or edema.   Neuro: Alert and oriented X 3. Psych:  Good affect, responds appropriately   LABS: Basic Metabolic Panel: Recent Labs    11/10/24 0346 11/11/24 0430  NA 136 138  K 2.8* 3.5  CL 93* 96*  CO2 29 32  GLUCOSE 115* 65*  BUN 50* 46*  CREATININE 1.38* 1.37*  CALCIUM  8.9 9.2  MG  --  1.3*   Liver Function Tests: No results for input(s): AST, ALT, ALKPHOS, BILITOT, PROT, ALBUMIN  in the last 72 hours. No results for input(s): LIPASE, AMYLASE in the last 72 hours. CBC: Recent Labs    11/09/24 0402 11/10/24 0346  WBC 4.0 4.2  HGB 9.9* 9.6*  HCT 30.0* 29.1*  MCV 96.5 95.7  PLT 83* 95*   Cardiac Enzymes: No results for input(s): CKTOTAL, CKMB, CKMBINDEX, TROPONINI in the last 72 hours. BNP: Invalid input(s): POCBNP D-Dimer: No results for input(s): DDIMER in the last 72 hours. Hemoglobin A1C: No results for input(s): HGBA1C in the last 72 hours. Fasting Lipid Panel: No results for  input(s): CHOL, HDL, LDLCALC, TRIG, CHOLHDL, LDLDIRECT in the last 72 hours. Thyroid  Function Tests: No results for input(s): TSH, T4TOTAL, T3FREE, THYROIDAB in the last 72 hours.  Invalid input(s): FREET3 Anemia Panel: No results for input(s): VITAMINB12, FOLATE, FERRITIN, TIBC, IRON , RETICCTPCT in the last 72 hours.  No results found.   Echo LVEF 35-40%, biatrial enlargement, status post mitral clip, severe tricuspid regurgitation   TELEMETRY: Sinus rhythm 58 bpm:  ASSESSMENT AND PLAN:  Principal Problem:   Hemorrhagic shock (HCC) Active Problems:   Atrial fibrillation, chronic (HCC)   Type 2 diabetes mellitus (HCC)   OSA on CPAP   CAD with hx of CABG   Cirrhosis and Portal hypertension  on CT(HCC)   Chronic systolic CHF (congestive heart failure) (HCC) - LVEF 35 to 40%   Hypothyroidism   Acute renal failure   Ileostomy in place [facility])   Esophageal varices without bleeding (HCC)   Protein calorie malnutrition   Shock (HCC)   Acute GI bleeding   Chronic kidney disease, stage 3b (HCC)   Leg edema   Malnutrition of moderate degree    1. Acute on chronic HFrEF, LVEF 35-40%, clinically improved on furosemide  40 mg IV twice daily, unable to tolerate good medical management due to low blood pressure, on low-dose carvedilol , and on  midodrine  for low blood pressure 2.  MitraClip 3.  Chronic atrial fibrillation, with controlled rate, unable to tolerate chronic anticoagulation 4.  GI bleed, hemorrhagic shock, hemoglobin and hematocrit 9.3 and 28.3, respectively 5.  CKD stage IIIa, followed by nephrology   Recommendations   1.  Agree with current therapy 2.  Continue diuresis (furosemide  40 mg IV twice daily) 3.  Carefully monitor renal status, followed by nephrology 4.  Consider adding SGLTi   Ephraim Reichel, MD, PhD, FACC 11/11/2024 8:24 AM

## 2024-11-11 NOTE — Inpatient Diabetes Management (Signed)
 Inpatient Diabetes Program Recommendations  AACE/ADA: New Consensus Statement on Inpatient Glycemic Control   Target Ranges:  Prepandial:   less than 140 mg/dL      Peak postprandial:   less than 180 mg/dL (1-2 hours)      Critically ill patients:  140 - 180 mg/dL     Latest Reference Range & Units 11/11/24 04:30  Glucose 70 - 99 mg/dL 65 (L)    Latest Reference Range & Units 11/10/24 08:42 11/10/24 12:05 11/10/24 16:31 11/10/24 21:14 11/11/24 08:11  Glucose-Capillary 70 - 99 mg/dL 98 750 (H) 756 (H) 843 (H) 181 (H)   Review of Glycemic Control  Diabetes history: DM2 Outpatient Diabetes medications: Jardiance  10 mg every day, Metformin  1000 mg BID  Current orders for Inpatient glycemic control: Lantus  15 units at bedtime, Novolog  5 units TID with meals, Novolog  0-15 units TID with meals, Novolog  0-5 units at bedtime   Inpatient Diabetes Program Recommendations:     Insulin : Lab glucose 65 mg/dl at 5:69 am today. Please consider decreasing Lantus  to 12 units at bedtime.  Thanks, Earnie Gainer, RN, MSN, CDCES Diabetes Coordinator Inpatient Diabetes Program [phone] (Team Pager from 8am to 5pm)

## 2024-11-11 NOTE — Plan of Care (Signed)
   Problem: Education: Goal: Knowledge of General Education information will improve Description Including pain rating scale, medication(s)/side effects and non-pharmacologic comfort measures Outcome: Progressing   Problem: Health Behavior/Discharge Planning: Goal: Ability to manage health-related needs will improve Outcome: Progressing

## 2024-11-12 DIAGNOSIS — Z932 Ileostomy status: Secondary | ICD-10-CM

## 2024-11-12 LAB — GLUCOSE, CAPILLARY
Glucose-Capillary: 121 mg/dL — ABNORMAL HIGH (ref 70–99)
Glucose-Capillary: 243 mg/dL — ABNORMAL HIGH (ref 70–99)
Glucose-Capillary: 93 mg/dL (ref 70–99)
Glucose-Capillary: 294 mg/dL — ABNORMAL HIGH (ref 70–99)

## 2024-11-12 LAB — CBC
HCT: 23.7 % — ABNORMAL LOW (ref 36.0–46.0)
Hemoglobin: 7.9 g/dL — ABNORMAL LOW (ref 12.0–15.0)
MCH: 32.5 pg (ref 26.0–34.0)
MCHC: 33.3 g/dL (ref 30.0–36.0)
MCV: 97.5 fL (ref 80.0–100.0)
Platelets: 80 K/uL — ABNORMAL LOW (ref 150–400)
RBC: 2.43 MIL/uL — ABNORMAL LOW (ref 3.87–5.11)
RDW: 23.8 % — ABNORMAL HIGH (ref 11.5–15.5)
WBC: 3.4 K/uL — ABNORMAL LOW (ref 4.0–10.5)
nRBC: 0 % (ref 0.0–0.2)

## 2024-11-12 LAB — RENAL FUNCTION PANEL
Albumin: 3.2 g/dL — ABNORMAL LOW (ref 3.5–5.0)
Anion gap: 9 (ref 5–15)
BUN: 41 mg/dL — ABNORMAL HIGH (ref 8–23)
CO2: 31 mmol/L (ref 22–32)
Calcium: 8.7 mg/dL — ABNORMAL LOW (ref 8.9–10.3)
Chloride: 93 mmol/L — ABNORMAL LOW (ref 98–111)
Creatinine, Ser: 1.18 mg/dL — ABNORMAL HIGH (ref 0.44–1.00)
GFR, Estimated: 50 mL/min — ABNORMAL LOW (ref >60)
Glucose, Bld: 119 mg/dL — ABNORMAL HIGH (ref 70–99)
Phosphorus: 3.3 mg/dL (ref 2.5–4.6)
Potassium: 3.4 mmol/L — ABNORMAL LOW (ref 3.5–5.1)
Sodium: 133 mmol/L — ABNORMAL LOW (ref 135–145)

## 2024-11-12 LAB — HEMOGLOBIN AND HEMATOCRIT, BLOOD
HCT: 20.4 % — ABNORMAL LOW (ref 36.0–46.0)
Hemoglobin: 6.9 g/dL — ABNORMAL LOW (ref 12.0–15.0)

## 2024-11-12 LAB — MAGNESIUM: Magnesium: 1.8 mg/dL (ref 1.7–2.4)

## 2024-11-12 MED ORDER — POTASSIUM CHLORIDE CRYS ER 20 MEQ PO TBCR
40.0000 meq | EXTENDED_RELEASE_TABLET | Freq: Once | ORAL | Status: AC
  Administered 2024-11-12: 21:00:00 40 meq via ORAL
  Filled 2024-11-12: qty 2

## 2024-11-12 MED ORDER — SODIUM CHLORIDE 0.9% IV SOLUTION: Freq: Once | INTRAVENOUS | Status: AC

## 2024-11-12 MED ORDER — MAGNESIUM SULFATE 2 GM/50ML IV SOLN
2.0000 g | Freq: Once | INTRAVENOUS | Status: AC
  Administered 2024-11-12: 09:00:00 2 g via INTRAVENOUS
  Filled 2024-11-12: qty 50

## 2024-11-12 NOTE — Consult Note (Signed)
 PHARMACY CONSULT NOTE - ELECTROLYTES  Pharmacy Consult for Electrolyte Monitoring and Replacement   Recent Labs: Potassium (mmol/L)  Date Value  11/12/2024 3.4 (L)   Magnesium  (mg/dL)  Date Value  87/83/7974 1.8   Calcium  (mg/dL)  Date Value  87/83/7974 8.7 (L)   Albumin  (g/dL)  Date Value  87/83/7974 3.2 (L)  05/18/2020 4.7   Phosphorus (mg/dL)  Date Value  87/83/7974 3.3   Sodium (mmol/L)  Date Value  11/12/2024 133 (L)   Height: 5' 1 (154.9 cm) Weight: 66.5 kg (146 lb 9.7 oz) IBW/kg (Calculated) : 47.8 Estimated Creatinine Clearance: 39.3 mL/min (A) (by C-G formula based on SCr of 1.18 mg/dL (H)).  Assessment  Tammy Arias is a 68 y.o. female presenting with acute HFrEF. PMH significant for CHF, Afib, GI bleeding, CABG, pHTN, liver cirrhosis, C diff s/p colectomy and end ileostomy. Pharmacy has been consulted to monitor and replace electrolytes.  Diet: Carb modified MIVF: N/A Pertinent medications: Lasix  40 mg IV BID, Aldactone  25 mg BID  Goal of Therapy: Electrolytes within normal limits  Plan:  Mg 2 g IV x 1 Kcl 40 mg daily while on IV lasix . And will add another 40 mEq in the PM.  F/u with AM labs.    Thank you for allowing pharmacy to be a part of this patients care.  Pharell Rolfson S Brock Mokry 11/12/2024 6:45 AM

## 2024-11-12 NOTE — Plan of Care (Signed)
   Problem: Education: Goal: Knowledge of General Education information will improve Description Including pain rating scale, medication(s)/side effects and non-pharmacologic comfort measures Outcome: Progressing

## 2024-11-12 NOTE — Progress Notes (Signed)
 Nutrition Follow-up  DOCUMENTATION CODES:   Non-severe (moderate) malnutrition in context of chronic illness  INTERVENTION:   -Continue carb modified diet -Continue MVI with minerals daily -Continue Glucerna Shake po TID, each supplement provides 220 kcal and 10 grams of protein   NUTRITION DIAGNOSIS:   Moderate Malnutrition related to chronic illness as evidenced by moderate fat depletion, moderate muscle depletion.  Ongoing  GOAL:   Patient will meet greater than or equal to 90% of their needs  Progressing   MONITOR:   PO intake, Supplement acceptance, Labs, Weight trends, I & O's, Skin  REASON FOR ASSESSMENT:   Consult Assessment of nutrition requirement/status  ASSESSMENT:   68 y/o female with h/o ischemic cardiomyopathy, gastric AVM, GI angiodysplasia, esophageal varices, IBS-D, IDA, s/p watchman device, cholelithiasis, TIA, hypothyroidism, pulmonary hypertension, GERD, barrett's esophagus, HLD, HTN, OSA, CAD s/p CABG x 3, PAF, CHF, cirrhosis, portal hypertension, gout, CKD III, B12 deficiency, ventral hernia s/p mesh repair 2018, colonic angioectasias, diverticulosis, DDD, hiatal hernia, C-diff, pancolitis s/p exploratory laparotomy, total colectomy and end ileostomy 02/29/24 complicated by incisional hernia and recent admission for GIB, AKI and shock complicated by vitamin K deficiency and who is now admitted with recurrent GIB, shock and AKI requiring CRRT from 12/1-12/3.  12/3- CRRT d/c, transitioned to HD  12/7- temporary HD cath removed, patient no longer requires HD secondary tyo renal recovery  Reviewed I/O's: -2.3 L x 24 hours and -30.5 L since 10/29/24  UOP: 1.5 L x 24 hours  Ileostomy output: 1.8 L x 24 hours   Patient sitting up in bed, talking on the phone at time of visit. She continues to have a good appetite, is consuming 100% of meals on a carb modified diet. She is also drinking Glucerna supplements and likes them.   Reviewed weight history. Weight  has ranged from 62.8-73 kg. Suspect some weight loss is related to diuresis. Patient is -30.5 L since 10/29/24  Per palliative care notes, patient is DNR/ DNI. Plan to discharge home with home health as she is refusing SNF placement.   Medications reviewed and include lasix , protonix , vitamin K, potassium chloride , and aldactone .   Labs reviewed: CBGS: 147-243 (inpatient orders for glycemic control are 0-15 units insulin  aspart TID with meals, 0-5 units insulin  aspart daily at bedtime, 5 units insulin  aspart TID with meals, and 15 units insulin  glargine daily). DM coordinator following.   Diet Order:   Diet Order             Diet Carb Modified Fluid consistency: Thin  Diet effective now                   EDUCATION NEEDS:   Education needs have been addressed  Skin:  Skin Assessment: Reviewed RN Assessment  Last BM:  11/12/24 (100 ml via ileostomy)  Height:   Ht Readings from Last 1 Encounters:  10/27/24 5' 1 (1.549 m)    Weight:   Wt Readings from Last 1 Encounters:  11/12/24 66.5 kg    Ideal Body Weight:  47.7 kg  BMI:  Body mass index is 27.7 kg/m.  Estimated Nutritional Needs:   Kcal:  1600-1800kcal/day  Protein:  80-90g/day  Fluid:  1.4-1.6L/day    Margery ORN, RD, LDN, CDCES Registered Dietitian III Certified Diabetes Care and Education Specialist If unable to reach this RD, please use RD Inpatient group chat on secure chat between hours of 8am-4 pm daily

## 2024-11-12 NOTE — Progress Notes (Signed)
 Progress Note   Patient: Tammy Arias FMW:[id number] DOB: [birth year] DOA: 10/27/2024     16 DOS: the patient was seen and examined on 11/12/2024   Brief hospital course: 68 y.o.female with medical problems of heart failure with reduced ejection fraction 25 to 30%, severe mitral regurgitation, moderate right ventricular systolic dysfunction, severe tricuspid regurgitation, atrial fibrillation, failed Watchman device, chronic anticoagulation with apixaban , GI bleed, coronary disease status post CABG in 2005, aortic stenosis repair, pulmonary hypertension, type 2 diabetes, hypertension, liver cirrhosis with grade 2 esophageal varices, C. 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     She reports to ED for generalized malaise and is being admitted for hypotension +GIB from ileostomy.  Patient initially admitted for hemorrhagic stroke got multiple IV fluid boluses, IV pressors with electrolyte derangements she required Levophed  and was in ICU.  There is a component of cardiogenic shock with extensive cardiac history.  Cardiology was consulted, weaned off pressors, doing good on IV Lasix  therapy with close monitoring of renal function by nephrology team. 12/16. She has bleeding from colostomy which is ongoing issue, no intervention per GI. Repeat Hb 6.9, 1 unit PRBC ordered.  Assessment and Plan: Acute on chronic HFrEF: Cardiogenic shock-resolved EF 35 to 40%, moderate MR history of mitral valve repair, severe TR. Status post volume overloaded after fluid resuscitation. Continue IV Lasix  40 mg twice daily per nephrology. Cardiology follow up appreciated. On aldactone , coreg , further GDMT limited by hypotension.  Continue midodrine  therapy.  AKI on CKD stage III: Nephrology following. Creatinine improved to 1.18. Continue IV Lasix , status post metolazone . Continue to monitor daily renal function.  Possible adrenal insufficiency: Patient got stress  dose steroids which is completed now.  GI bleeding: History of AVMs Esophageal varices History of recurrent GI bleeds- Does have on and off bleeding in the ostomy.  GI did not recommend any intervention. She received 3 units of PRBC total. Today Hb 6.9, another unit PRBC ordered. Continue to monitor and transfuse if Hb<7. Once Hb stable plan to send her with hematology follow up as she may need outpatient transfusion. Hematology appointment scheduled per her in 2 weeks  Hyponatremia-stable Nephrology on board.  Paroxysmal A-fib: Rate controlled status post Watchman procedure.  Not on anticoagulation secondary to GI bleed.  Thrombocytopenia-stable.  Cirrhosis-stable Vitamin K deficiency: On daily vitamin K p.o 2.5mg  daily.  Ileostomy status- History of C. 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  Hypothyroidism-continue home levothyroxine .  Type 2 diabetes mellitus-: Continue Lantus , NovoLog  5 units 3 times daily.  OSA-not compliant with CPAP.  Debility: Refused SNF, wishes to go home with home health.       Out of bed to chair. Incentive spirometry. Nursing supportive care. Fall, aspiration precautions. Diet:  Diet Orders (From admission, onward)     Start     Ordered   11/01/24 1343  Diet Carb Modified Fluid consistency: Thin  Diet effective now       Question Answer Comment  Calorie Level Medium 1600-2000   Fluid consistency: Thin      11/01/24 1343           DVT prophylaxis: SCDs Start: 10/27/24 1241  Level of care: Progressive   Code Status: Limited: Do not attempt resuscitation (DNR) -DNR-LIMITED -Do Not Intubate/DNI   Subjective: Patient is seen and examined today morning.  She is lying in bed. Feels weak, leg swelling persists. Ostomy with bleeding x2 since  yesterday.  Physical Exam: Vitals:   11/12/24 0742 11/12/24 1213 11/12/24 1626 11/12/24 2001  BP: (!) 99/43 (!) 104/58 95/60 (!)  107/54  Pulse: 76 79 78 83  Resp:  18 16 20   Temp: 98.3 F (36.8 C) 98.8 F (37.1 C) 98.5 F (36.9 C) 98.6 F (37 C)  TempSrc: Oral     SpO2: 100% 100% 100% 96%  Weight:      Height:        General - Elderly Caucasian female, no apparent distress HEENT - PERRLA, EOMI, atraumatic head, non tender sinuses. Lung - Clear, diffuse rales, no rhonchi, wheezes. Heart - S1, S2 heard, no murmurs, rubs, 2+ pedal edema. Abdomen - Soft, non tender, colostomy with brown stool. Neuro - Alert, awake and oriented x 3, non focal exam. Skin - Warm and dry.  Data Reviewed:      Latest Ref Rng & Units 11/12/2024   12:21 PM 11/12/2024    8:10 AM 11/11/2024   10:30 PM  CBC  WBC 4.0 - 10.5 K/uL  3.4    Hemoglobin 12.0 - 15.0 g/dL 6.9  7.9  7.9   Hematocrit 36.0 - 46.0 % 20.4  23.7  24.9   Platelets 150 - 400 K/uL  80        Latest Ref Rng & Units 11/12/2024    4:12 AM 11/11/2024    4:30 AM 11/10/2024    3:46 AM  BMP  Glucose 70 - 99 mg/dL 880  65  884   BUN 8 - 23 mg/dL 41  46  50   Creatinine 0.44 - 1.00 mg/dL 8.81  8.62  8.61   Sodium 135 - 145 mmol/L 133  138  136   Potassium 3.5 - 5.1 mmol/L 3.4  3.5  2.8   Chloride 98 - 111 mmol/L 93  96  93   CO2 22 - 32 mmol/L 31  32  29   Calcium  8.9 - 10.3 mg/dL 8.7  9.2  8.9    No results found.  Family Communication: Discussed with patient, understand and agree. All questions answered.  Disposition: Status is: Inpatient Remains inpatient appropriate because: IV lasix , active bleeding, monitor Hb,   Planned Discharge Destination: Home with Home Health     Time spent: 44 minutes  Author: Concepcion Riser, MD 11/12/2024 9:05 PM Secure chat 7am to 7pm For on call review [email or web address].

## 2024-11-12 NOTE — Progress Notes (Addendum)
° °      Overnight   NAME: Tammy Arias MRN: [id number] DOB : [birth year]    Date of Service   11/12/2024   HPI/Events of Note   HPI: 68 year old female with medical problems of heart failure reduced ejection fraction 25 to 30% severe mitral regurgitation moderate right ventricular systole dysfunction severe tricuspid regurgitation A-fib with failed Watchman device chronic anticoagulation on apixaban  GI bleed coronary artery disease status post CABG in 2005, aortic stenosis repair, pulmonary hypertension, type 2 diabetes, hypertension, liver cirrhosis with grade 2 esophageal varices, C. difficile colitis in April 2025, history of ex lap with total colectomy end ileostomy.  Reported to the emergency department for generalized malaise and is being admitted for hypotension positive GI bleed from ileostomy.  Overnight: Per RN patient hypotensive with maps in the low 60s.  With 800 mL of bright red blood from ileostomy.  Photo attached.  RN reports mentation is normal.  No decreasing urine output.     Interventions/ Plan   Stat H and H Hold Sprinolactone 500 cc bolus     0100-stat H&H came back for hemoglobin of 7.9, mentation still normal, no decrease in urine output.  Patient already on 3 times daily 15 mg of midodrine . Repeat cbc @0600  and  Consider holding IV Lasix  in the morning.   Sarthak Rubenstein Donati- Aram BSN RN CCRN AGACNP-BC Acute Care Nurse Practitioner [facility]

## 2024-11-12 NOTE — Progress Notes (Signed)
 Palliative Care Progress Note, Assessment & Plan   Patient Name: Tammy Arias       Date: 11/12/2024 DOB: [birth year]  Age: 68 y.o. MRN#: [id number] Attending Physician: Darci Pore, MD Primary Care Physician: Valora Lynwood FALCON, MD Admit Date: 10/27/2024  Subjective: Patient is sitting up in bed about to eat her lunch.  She is awake, alert, oriented x 4, acknowledges my presence, and is able to make her wishes known.  No family or friends are present at bedside during my visit.  HPI: 68 y.o.female with medical problems of heart failure with reduced ejection fraction 25 to 30%, severe mitral regurgitation, moderate right ventricular systolic dysfunction, severe tricuspid regurgitation, atrial fibrillation, failed Watchman device, chronic anticoagulation with apixaban , GI bleed, coronary disease status post CABG in 2005, aortic stenosis repair, pulmonary hypertension, type 2 diabetes, hypertension, liver cirrhosis with grade 2 esophageal varices, C. 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     She reported to ED for generalized malaise and was admitted for hypotension +GIB from ileostomy.  Patient initially admitted for hemorrhagic stroke - got multiple IV fluid boluses, IV pressors with electrolyte derangements. She required Levophed  and was in ICU.  There is a component of cardiogenic shock with extensive cardiac history.  Cardiology was consulted, weaned off pressors, doing good on IV Lasix  therapy with close monitoring of renal function by nephrology team.  PMT was consulted to support patient with goals of care discussions.  Summary of counseling/coordination of care: Extensive chart review completed prior to meeting patient including labs, vital  signs, imaging, progress notes, orders, and available advanced directive documents from current and previous encounters.   After reviewing the patient's chart and assessing the patient at bedside, I spoke with patient in regards to symptom management and goals of care.   I attempted to gauge patient's understanding of her current medical situation.  She shares that if her hemoglobin stays stable and she is able to discharge today.  She shares she will ensure she follows up with her primary care provider in 1 week to follow her hemoglobin.  During her visit, lab tech came to draw H&H.  Discussed that if H&H is stable patient will likely be able to discharge home today.  However, if it is not improved or stayed stable then she will likely remain in the hospital pending further treatment.  She endorses understanding.  Attempted to elicit values and goals important to the patient.  She shares she wants to ensure that her hemoglobin stays stable and that she can discharge soon.  Discussed significance of acute on chronic HFrEF with cardiogenic shock, AKI on CKD, GI bleeds and there connection with patient's hemoglobin, and patient's overall functional status.  Patient endorses understanding that her comorbidities are ongoing, irreversible but she remains hopeful that she can gain some strength back by returning home with home health.  No acute palliative needs at this time and no change to plan of care at this time.  PMT will continue to follow and support.   Physical Exam Vitals reviewed.  Constitutional:      General: She is not in acute distress.    Appearance:  She is normal weight.  HENT:     Head: Normocephalic.     Mouth/Throat:     Mouth: Mucous membranes are moist.  Eyes:     Pupils: Pupils are equal, round, and reactive to light.  Cardiovascular:     Rate and Rhythm: Normal rate. Rhythm irregular.     Pulses: Normal pulses.  Pulmonary:     Effort: Pulmonary effort is normal.   Abdominal:     Palpations: Abdomen is soft.  Neurological:     Mental Status: She is alert and oriented to person, place, and time.  Psychiatric:        Mood and Affect: Mood normal.        Behavior: Behavior normal.              Recommendations:   As per previous discussions, DNR with limited interventions remains Monitoring of H&H -determine if patient can discharge today or if further treatment is needed  35 minute visit includes: Detailed review of medical records (labs, imaging, vital signs), medically appropriate exam (mental status, respiratory, cardiac, skin), discussed with treatment team, counseling and educating patient, family and staff, documenting clinical information, medication management and coordination of care.  Lamarr L. Arvid, DNP, FNP-BC Palliative Medicine Team

## 2024-11-12 NOTE — Progress Notes (Signed)
 Physical Therapy Treatment Patient Details Name: Tammy Arias MRN: [id number] DOB: [birth year] Today's Date: 11/12/2024   History of Present Illness 68 y.o.female with medical problems of heart failure with reduced ejection fraction 25 to 30%, severe mitral regurgitation, moderate right ventricular systolic dysfunction, severe tricuspid regurgitation, atrial fibrillation, failed Watchman device, chronic anticoagulation with apixaban , GI bleed, coronary disease status post CABG in 2005, aortic stenosis repair, pulmonary hypertension, type 2 diabetes, hypertension, liver cirrhosis with grade 2 esophageal varices, C. 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         She reports to ED for generalized malaise and is being admitted for hypotension  +GIB from ileostomy    PT Comments  Patient resting in bed upon arrival to room, but eager for mobilization with therapist.  Completes two full laps around nursing station with RW, close sup; demonstrates mild forward trunk flexion; partiallly reciprocal stepping pattern with fair step height/length. Decreased cadence/gait speed, but no overt buckling or LOB. Mild/mod reliance on RW for safety, energy conservation; do recommend continued use at this time.   Additional activity deferred at this time, as patient wishing to visit with friend who arrived during session.  Will plan to integrate standing therex, dynamic balance activities next session as appropriate and follow up with corresponding HEP as tolerated.     If plan is discharge home, recommend the following: Assistance with cooking/housework;Assist for transportation;Help with stairs or ramp for entrance;A little help with walking and/or transfers;A little help with bathing/dressing/bathroom   Can travel by private vehicle        Equipment Recommendations       Recommendations for Other Services       Precautions / Restrictions  Precautions Precautions: Fall Recall of Precautions/Restrictions: Intact Restrictions Weight Bearing Restrictions Per Provider Order: No     Mobility  Bed Mobility Overal bed mobility: Modified Independent Bed Mobility: Supine to Sit, Sit to Supine                Transfers Overall transfer level: Modified independent Equipment used: None Transfers: Sit to/from Stand                  Ambulation/Gait Ambulation/Gait assistance: Supervision Gait Distance (Feet): 425 Feet Assistive device: Rolling walker (2 wheels)     Gait velocity interpretation: <1.31 ft/sec, indicative of household ambulator   General Gait Details: mild forward trunk flexion; partiallly reciprocal stepping pattern with fair step height/length.  Decreased cadence/gait speed, but no overt buckling or LOB. Mild/mod reliance on RW for safety, energy conservation; do recommend continued use at this time.   Stairs             Wheelchair Mobility     Tilt Bed    Modified Rankin (Stroke Patients Only)       Balance Overall balance assessment: Needs assistance Sitting-balance support: No upper extremity supported, Feet supported Sitting balance-Leahy Scale: Normal     Standing balance support: Bilateral upper extremity supported Standing balance-Leahy Scale: Good                              Communication Communication Communication: No apparent difficulties Factors Affecting Communication: Hearing impaired  Cognition Arousal: Alert Behavior During Therapy: WFL for tasks assessed/performed   PT - Cognitive impairments: No apparent impairments  Following commands: Intact      Cueing Cueing Techniques: Verbal cues  Exercises      General Comments        Pertinent Vitals/Pain Pain Assessment Pain Assessment: No/denies pain    Home Living                          Prior Function            PT Goals (current  goals can now be found in the care plan section) Acute Rehab PT Goals Patient Stated Goal: Go home (and not rehab) PT Goal Formulation: With patient Time For Goal Achievement: 11/12/24 Potential to Achieve Goals: Good Progress towards PT goals: Progressing toward goals    Frequency    Min 1X/week      PT Plan      Co-evaluation              AM-PAC PT 6 Clicks Mobility   Outcome Measure  Help needed turning from your back to your side while in a flat bed without using bedrails?: None Help needed moving from lying on your back to sitting on the side of a flat bed without using bedrails?: None Help needed moving to and from a bed to a chair (including a wheelchair)?: None Help needed standing up from a chair using your arms (e.g., wheelchair or bedside chair)?: None Help needed to walk in hospital room?: None Help needed climbing 3-5 steps with a railing? : A Little 6 Click Score: 23    End of Session Equipment Utilized During Treatment: Gait belt Activity Tolerance: Patient tolerated treatment well Patient left: in chair;with call bell/phone within reach Nurse Communication: Mobility status PT Visit Diagnosis: Muscle weakness (generalized) (M62.81);Difficulty in walking, not elsewhere classified (R26.2)     Time: 1550-1605 PT Time Calculation (min) (ACUTE ONLY): 15 min  Charges:    $Gait Training: 8-22 mins PT General Charges $$ ACUTE PT VISIT: 1 Visit                     Tivon Lemoine H. Delores, PT, DPT, NCS 11/12/2024, 4:47 PM [phone]

## 2024-11-12 NOTE — Plan of Care (Signed)

## 2024-11-12 NOTE — Progress Notes (Signed)
 [facility] CLINIC CARDIOLOGY PROGRESS NOTE   Patient ID: [name] MRN: [id number] DOB/AGE: 1956-07-11 68 y.o.  Admit date: 10/27/2024 Referring Physician Dr. Juanita Picking Primary Physician Valora Lynwood FALCON, MD  Primary Cardiologist Dr. Florencio Reason for Consultation atrial fibrillation, GI bleed  HPI: Tammy Arias is a 68 y.o. female with a past medical history of coronary artery disease s/p CABG, ischemic cardiomyopathy, chronic HFrEF, history of atrial fibrillation s/p failed Watchman, pulmonary hypertension, hx GI bleed who presented to the ED on 10/27/2024 for bloody drainage from ileostomy. Treated for hemorrhagic shock with improvement in this but with persistent hypotension and fluid retention. Cardiology was consulted for further evaluation.   Interval History:  -Patient seen and examined this AM, sitting upright in hospital bed. -BP remains stable. Cr stable, still with significant LE edema. Endorses good UOP. -No significant events noted on tele.   Review of systems complete and found to be negative unless listed above   Vitals:   11/12/24 0012 11/12/24 0420 11/12/24 0500 11/12/24 0742  BP: (!) 97/45 (!) 91/51  (!) 99/43  Pulse:  73  76  Resp: 17 17    Temp: 98.1 F (36.7 C) 97.9 F (36.6 C)  98.3 F (36.8 C)  TempSrc: Oral   Oral  SpO2: 99% 100%  100%  Weight:   66.5 kg   Height:         Intake/Output Summary (Last 24 hours) at 11/12/2024 0757 Last data filed at 11/12/2024 9579 Gross per 24 hour  Intake 962.46 ml  Output 3250 ml  Net -2287.54 ml     PHYSICAL EXAM General: Chronically ill appearing female, well nourished, in no acute distress. HEENT: Normocephalic and atraumatic. Neck: No JVD.  Lungs: Normal respiratory effort on room air. Clear bilaterally to auscultation. No wheezes, crackles, rhonchi.  Heart: irregularly irregular, controlled rate. Normal S1 and S2 without gallops or murmurs. Radial & DP pulses 2+ bilaterally. Abdomen:  Non-distended appearing.  Msk: Normal strength and tone for age. Extremities: No clubbing, cyanosis.  Pitting edema bilaterally. Neuro: Alert and oriented X 3. Psych: Mood appropriate, affect congruent.    LABS: Basic Metabolic Panel: Recent Labs    11/11/24 0430 11/12/24 0412  NA 138 133*  K 3.5 3.4*  CL 96* 93*  CO2 32 31  GLUCOSE 65* 119*  BUN 46* 41*  CREATININE 1.37* 1.18*  CALCIUM  9.2 8.7*  MG 1.3* 1.8  PHOS  --  3.3   Liver Function Tests: Recent Labs    11/12/24 0412  ALBUMIN  3.2*    No results for input(s): LIPASE, AMYLASE in the last 72 hours. CBC: Recent Labs    11/10/24 0346 11/11/24 1643 11/11/24 2230  WBC 4.2  --   --   HGB 9.6* 9.1* 7.9*  HCT 29.1* 28.0* 24.9*  MCV 95.7  --   --   PLT 95*  --   --    Cardiac Enzymes: No results for input(s): CKTOTAL, CKMB, CKMBINDEX, TROPONINIHS in the last 72 hours. BNP: No results for input(s): BNP in the last 72 hours. D-Dimer: No results for input(s): DDIMER in the last 72 hours. Hemoglobin A1C: No results for input(s): HGBA1C in the last 72 hours. Fasting Lipid Panel: No results for input(s): CHOL, HDL, LDLCALC, TRIG, CHOLHDL, LDLDIRECT in the last 72 hours. Thyroid  Function Tests: No results for input(s): TSH, T4TOTAL, T3FREE, THYROIDAB in the last 72 hours.  Invalid input(s): FREET3  Anemia Panel: No results for input(s): VITAMINB12, FOLATE, FERRITIN, TIBC, IRON ,  RETICCTPCT in the last 72 hours.  No results found.   ECHO 07/2024: 1. Left ventricular ejection fraction, by estimation, is 35 to 40%. The left ventricle has moderately decreased function. The left ventricle demonstrates global hypokinesis. Left ventricular diastolic parameters are indeterminate.   2. Mildly D-shaped interventricular septum suggestive of RV pressure/volume overload. Peak RV-RA gradient 36 mmHg. Right ventricular systolic function is moderately reduced. The right  ventricular size is severely enlarged.   3. Left atrial size was severely dilated.   4. Right atrial size was severely dilated.   5. S/p mitral valve repair via mTEER with 1 Mitraclip, position appears  A2/P2. Mean gradient 5 mmHg. Moderate residual mitral regurgitation.   6. The tricuspid valve is abnormal. Tricuspid valve regurgitation is severe.   7. The aortic valve is tricuspid. There is mild calcification of the aortic valve. Aortic valve regurgitation is not visualized. No aortic stenosis is present.   8. The IVC was not visualized.   9. The patient was in atrial fibrillation.   TELEMETRY (personally reviewed): atrial fibrillation rate 50-60s  EKG (personally reviewed): Atrial fibrillation low voltage nonspecific ST-T wave changes 78 bpm  DATA reviewed by me 11/12/2024: last 24h vitals tele labs imaging I/O, hospitalist progress note  Principal Problem:   Hemorrhagic shock (HCC) Active Problems:   Atrial fibrillation, chronic (HCC)   Type 2 diabetes mellitus (HCC)   OSA on CPAP   CAD with hx of CABG   Cirrhosis and Portal hypertension  on CT(HCC)   Acute on chronic HFrEF (heart failure with reduced ejection fraction) (HCC)   Hypothyroidism   Acute renal failure   Ileostomy in place [facility])   Esophageal varices without bleeding (HCC)   Protein calorie malnutrition   Shock (HCC)   Acute GI bleeding   Chronic kidney disease, stage 3b (HCC)   Leg edema   Malnutrition of moderate degree    ASSESSMENT AND PLAN: Tammy Arias is a 68 y.o. female with a past medical history of coronary artery disease s/p CABG, ischemic cardiomyopathy, chronic HFrEF, history of atrial fibrillation s/p failed Watchman, pulmonary hypertension, hx GI bleed who presented to the ED on 10/27/2024 for bloody drainage from ileostomy. Treated for hemorrhagic shock with improvement in this but with persistent hypotension and fluid retention. Cardiology was consulted for further evaluation.   # Acute on  chronic HFrEF # GI bleed, hemorrhagic shock # Coronary artery disease # Chronic atrial fibrillation -Diuresis as per nephrology. Appreciate their recommendations.  -BP unable to tolerate further GDMT. Continue spironolactone  25 mg twice daily, carvedilol  3.125 mg twice daily as BP tolerates. Can consider SGLT2i.  -Midodrine  per primary team. Weaned off pressors.  -Given comorbidities, inability to tolerate anticoagulation, patient would likely not be a candidate for advanced therapies.   This patient's case was discussed and created with Dr. Florencio and he is in agreement.  Signed:  Danita Bloch, PA-C  11/12/2024, 7:57 AM [facility] Cardiology

## 2024-11-12 NOTE — Progress Notes (Signed)
 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 She reports to ED for generalized malaise and is being admitted for Hemorrhagic shock (HCC) [R57.8] Shock (HCC) [R57.9] Acute GI bleeding [K92.2] Acute renal failure, unspecified acute renal failure type [N17.9]  Update:  Patient reports that her edema was worse when she was sitting in a chair. Hemoglobin down to 6.9 today. Creatinine down to 1.18. Patient reported bleeding from her ostomy again this a.m.   Objective:  Vital signs in last 24 hours:  Temp:  [97.9 F (36.6 C)-98.8 F (37.1 C)] 98.5 F (36.9 C) (12/16 1626) Pulse Rate:  [73-79] 78 (12/16 1626) Resp:  [12-18] 16 (12/16 1626) BP: (88-104)/(40-60) 95/60 (12/16 1626) SpO2:  [99 %-100 %] 100 % (12/16 1626) Weight:  [66.5 kg] 66.5 kg (12/16 0500)  Weight change: 3.7 kg Filed Weights   11/10/24 0500 11/11/24 0500 11/12/24 0500  Weight: 71.2 kg 62.8 kg 66.5 kg    Intake/Output: I/O last 3 completed shifts: In: 1452.5 [P.O.:1197; IV Piggyback:255.5] Out: 5800 [Urine:3150; Stool:2650]   Intake/Output this shift:  Total I/O In: 360 [P.O.:360] Out: 900 [Urine:700; Stool:200]  Physical Exam: General: NAD  Head: Normocephalic, atraumatic. Moist oral mucosa  Eyes: Anicteric  Lungs:  Rhonchi/wheeze, normal effort  Heart: Regular rate and rhythm  Abdomen:  Soft, nontender. Ileostomy    Extremities: 2+ peripheral edema.  Neurologic: Awake, alert, conversant  Skin: Warm,dry, no rash  Access: None    Basic Metabolic Panel: Recent Labs  Lab 11/08/24 0424 11/09/24 0402 11/10/24 0346 11/11/24 0430 11/12/24 0412  NA 135 133* 136 138 133*  K 2.7* 3.3* 2.8* 3.5 3.4*  CL 93* 92* 93* 96* 93*  CO2 28 27 29  32 31  GLUCOSE 208* 319* 115* 65* 119*  BUN 43* 47* 50* 46* 41*  CREATININE 1.27* 1.30* 1.38* 1.37* 1.18*  CALCIUM  8.7* 9.1 8.9 9.2 8.7*  MG  --   --   --  1.3* 1.8  PHOS  --   --   --   --  3.3    Liver Function Tests: Recent Labs  Lab 11/12/24 0412  ALBUMIN  3.2*    No results for input(s): LIPASE, AMYLASE in the last 168 hours.  No results for input(s): AMMONIA in the last 168 hours.  CBC: Recent Labs  Lab 11/07/24 0354 11/08/24 0424 11/09/24 0402 11/10/24 0346 11/11/24 1643 11/11/24 2230 11/12/24 0810 11/12/24 1221  WBC 3.4* 2.6* 4.0 4.2  --   --  3.4*  --   HGB 10.0* 9.3* 9.9* 9.6* 9.1* 7.9* 7.9* 6.9*  HCT 30.4* 28.3* 30.0* 29.1* 28.0* 24.9* 23.7* 20.4*  MCV 95.9 96.3 96.5 95.7  --   --  97.5  --   PLT 67* 71* 83* 95*  --   --  80*  --     Cardiac Enzymes: No results for input(s): CKTOTAL, CKMB, CKMBINDEX, TROPONINI in the last 168 hours.  BNP: Invalid input(s): POCBNP  CBG: Recent Labs  Lab 11/11/24 1706 11/11/24 2053 11/12/24 0745 11/12/24 1135 11/12/24  1627  GLUCAP 236* 114* 121* 243* 93    Microbiology: Results for orders placed or performed during the hospital encounter of 10/27/24  Resp panel by RT-PCR (RSV, Flu A&B, Covid) Anterior Nasal Swab     Status: None   Collection Time: 10/27/24 11:01 AM   Specimen: Anterior Nasal Swab  Result Value Ref Range Status   SARS Coronavirus 2 by RT PCR NEGATIVE NEGATIVE Final    Comment: (NOTE) SARS-CoV-2 target nucleic acids are NOT DETECTED.  The SARS-CoV-2 RNA is generally detectable in upper respiratory specimens during the acute phase of infection. The  lowest concentration of SARS-CoV-2 viral copies this assay can detect is 138 copies/mL. A negative result does not preclude SARS-Cov-2 infection and should not be used as the sole basis for treatment or other patient management decisions. A negative result may occur with  improper specimen collection/handling, submission of specimen other than nasopharyngeal swab, presence of viral mutation(s) within the areas targeted by this assay, and inadequate number of viral copies(<138 copies/mL). A negative result must be combined with clinical observations, patient history, and epidemiological information. The expected result is Negative.  Fact Sheet for Patients:  bloggercourse.com  Fact Sheet for Healthcare Providers:  seriousbroker.it  This test is no t yet approved or cleared by the [place]  FDA and  has been authorized for detection and/or diagnosis of SARS-CoV-2 by FDA under an Emergency Use Authorization (EUA). This EUA will remain  in effect (meaning this test can be used) for the duration of the COVID-19 declaration under Section 564(b)(1) of the Act, 21 U.S.C.section 360bbb-3(b)(1), unless the authorization is terminated  or revoked sooner.       Influenza A by PCR NEGATIVE NEGATIVE Final   Influenza B by PCR NEGATIVE NEGATIVE Final    Comment: (NOTE) The Xpert Xpress SARS-CoV-2/FLU/RSV plus assay is intended as an aid in the diagnosis of influenza from Nasopharyngeal swab specimens and should not be used as a sole basis for treatment. Nasal washings and aspirates are unacceptable for Xpert Xpress SARS-CoV-2/FLU/RSV testing.  Fact Sheet for Patients: bloggercourse.com  Fact Sheet for Healthcare Providers: seriousbroker.it  This test is not yet approved or cleared by the [place]  FDA and has been authorized for detection and/or diagnosis of SARS-CoV-2 by FDA under  an Emergency Use Authorization (EUA). This EUA will remain in effect (meaning this test can be used) for the duration of the COVID-19 declaration under Section 564(b)(1) of the Act, 21 U.S.C. section 360bbb-3(b)(1), unless the authorization is terminated or revoked.     Resp Syncytial Virus by PCR NEGATIVE NEGATIVE Final    Comment: (NOTE) Fact Sheet for Patients: bloggercourse.com  Fact Sheet for Healthcare Providers: seriousbroker.it  This test is not yet approved or cleared by the [place]  FDA and has been authorized for detection and/or diagnosis of SARS-CoV-2 by FDA under an Emergency Use Authorization (EUA). This EUA will remain in effect (meaning this test can be used) for the duration of the COVID-19 declaration under Section 564(b)(1) of the Act, 21 U.S.C. section 360bbb-3(b)(1), unless the authorization is terminated or revoked.  Performed at [facility], [address], [place], [place] [address]   Blood Culture (routine x 2)     Status: None   Collection Time: 10/27/24 12:18 PM   Specimen: Right Antecubital; Blood  Result Value Ref Range Status   Specimen Description RIGHT ANTECUBITAL  Final   Special Requests   Final    BOTTLES DRAWN AEROBIC AND ANAEROBIC Blood Culture adequate  volume   Culture   Final    NO GROWTH 5 DAYS Performed at [facility], [address] [place], [place] [address]    Report Status 11/01/2024 FINAL  Final  Blood Culture (routine x 2)     Status: None   Collection Time: 10/27/24 12:18 PM   Specimen: Left Antecubital; Blood  Result Value Ref Range Status   Specimen Description LEFT ANTECUBITAL  Final   Special Requests   Final    BOTTLES DRAWN AEROBIC AND ANAEROBIC Blood Culture results may not be optimal due to an inadequate volume of blood received in culture bottles   Culture   Final    NO GROWTH 5 DAYS Performed at [facility], [address], [place], [place] [address]    Report Status 11/01/2024 FINAL  Final  MRSA Next Gen by PCR, Nasal     Status: Abnormal   Collection Time: 10/27/24  1:38 PM   Specimen: Nasal Mucosa; Nasal Swab  Result Value Ref Range Status   MRSA by PCR Next Gen DETECTED (A) NOT DETECTED Final    Comment: RESULT CALLED TO, READ BACK BY AND VERIFIED WITH: KATHERINE CLAYTON @1520  10/27/24 MJU (NOTE) The GeneXpert MRSA Assay (FDA approved for NASAL specimens only), is one component of a comprehensive MRSA colonization surveillance program. It is not intended to diagnose MRSA infection nor to guide or monitor treatment for MRSA infections. Test performance is not FDA approved in patients less than 34 years old. Performed at [facility], [address] [place], [place] [address]     Coagulation Studies: No results for input(s): LABPROT, INR in the last 72 hours.   Urinalysis: No results for input(s): COLORURINE, LABSPEC, PHURINE, GLUCOSEU, HGBUR, BILIRUBINUR, KETONESUR, PROTEINUR, UROBILINOGEN, NITRITE, LEUKOCYTESUR in the last 72 hours.  Invalid input(s): APPERANCEUR     Imaging: No results found.     Medications:      sodium chloride    Intravenous Once   allopurinol   300 mg Oral Daily   alteplase   2 mg Intracatheter Once   carvedilol   3.125 mg Oral BID WC   Chlorhexidine  Gluconate Cloth  6 each Topical Nightly   feeding supplement (GLUCERNA SHAKE)  237 mL Oral TID BM   furosemide   40 mg Intravenous BID   insulin  aspart  0-15 Units Subcutaneous TID WC   insulin  aspart  0-5 Units Subcutaneous QHS   insulin  aspart  5 Units Subcutaneous TID WC   insulin  glargine  15 Units Subcutaneous QHS   latanoprost   1 drop Both Eyes QHS   levothyroxine   50 mcg Oral QAC breakfast   midodrine   15 mg Oral TID WC   multivitamin with minerals  1 tablet Oral Daily   pantoprazole   40 mg Oral BID   phytonadione   2.5 mg Oral Daily   potassium chloride   40 mEq Oral  Daily   potassium chloride   40 mEq Oral Once   rosuvastatin   5 mg Oral Daily   sodium chloride  flush  3 mL Intravenous Q12H   spironolactone   25 mg Oral BID   acetaminophen , alum & mag hydroxide-simeth, docusate sodium , ipratropium-albuterol , magnesium  hydroxide, ondansetron  (ZOFRAN ) IV, mouth rinse, polyethylene glycol, sodium chloride  flush, traZODone   Assessment/ Plan:  Ms. VISTA SAWATZKY is a 68 y.o.  female presents to ED with weakness and has been admitted for Hemorrhagic shock (HCC) [R57.8] Shock (HCC) [R57.9] Acute GI bleeding [K92.2] Acute renal failure, unspecified acute renal failure type [N17.9]   Acute Kidney Injury -  Patient has baseline creatinine 0.92 on 08/22/24.  Acute kidney injury appears multifactorial at this time from prior hypotension and dehydration. Potassium 6.1 on admission. Corrected with shifting measures.  Required CRRT from 12/1 to 12/3.  Renal function improved.  Creatinine down to 1.18.  We will continue to PR-3 monitor.  Lab Results  Component Value Date   CREATININE 1.18 (H) 11/12/2024   CREATININE 1.37 (H) 11/11/2024   CREATININE 1.38 (H) 11/10/2024    Intake/Output Summary (Last 24 hours) at 11/12/2024 1714 Last data filed at 11/12/2024 1600 Gross per 24 hour  Intake 360 ml  Output 2450 ml  Net -2090 ml    2. Hypokalemia Likely secondary to aggressive IV diuresis. Potassium currently 3.4 and close to target.  Maintain the patient on potassium chloride  and spironolactone .  3. LE edema Continue Lasix  40 mg twice daily as well as spironolactone  25 mg twice daily.   4. Anemia with suspected acute blood loss Lab Results  Component Value Date   HGB 6.9 (L) 11/12/2024  Has received blood transfusions during this admission. GI has no plans to repeat EGD due to current and recent workups.  Hemoglobin down to 6.9 as patient had further bleeding from colostomy.  Blood transfusion as per primary team.     LOS: 16 Harvin Konicek 12/16/20255:14 PM

## 2024-11-12 NOTE — Plan of Care (Signed)
°  Problem: Education: Goal: Knowledge of General Education information will improve Description: Including pain rating scale, medication(s)/side effects and non-pharmacologic comfort measures 11/12/2024 1409 by Lynnette Cena CROME, RN Outcome: Progressing 11/12/2024 1408 by Lynnette Cena CROME, RN Outcome: Progressing   Problem: Health Behavior/Discharge Planning: Goal: Ability to manage health-related needs will improve Outcome: Progressing

## 2024-11-13 DIAGNOSIS — R578 Other shock: Secondary | ICD-10-CM | POA: Diagnosis not present

## 2024-11-13 LAB — GLUCOSE, CAPILLARY
Glucose-Capillary: 141 mg/dL — ABNORMAL HIGH (ref 70–99)
Glucose-Capillary: 251 mg/dL — ABNORMAL HIGH (ref 70–99)
Glucose-Capillary: 171 mg/dL — ABNORMAL HIGH (ref 70–99)
Glucose-Capillary: 166 mg/dL — ABNORMAL HIGH (ref 70–99)

## 2024-11-13 LAB — BASIC METABOLIC PANEL WITH GFR
Anion gap: 11 (ref 5–15)
BUN: 36 mg/dL — ABNORMAL HIGH (ref 8–23)
CO2: 27 mmol/L (ref 22–32)
Calcium: 9.5 mg/dL (ref 8.9–10.3)
Chloride: 91 mmol/L — ABNORMAL LOW (ref 98–111)
Creatinine, Ser: 1.22 mg/dL — ABNORMAL HIGH (ref 0.44–1.00)
GFR, Estimated: 48 mL/min — ABNORMAL LOW (ref >60)
Glucose, Bld: 136 mg/dL — ABNORMAL HIGH (ref 70–99)
Potassium: 4.6 mmol/L (ref 3.5–5.1)
Sodium: 129 mmol/L — ABNORMAL LOW (ref 135–145)

## 2024-11-13 LAB — PREPARE RBC (CROSSMATCH)

## 2024-11-13 LAB — HEMOGLOBIN AND HEMATOCRIT, BLOOD
HCT: 23.3 % — ABNORMAL LOW (ref 36.0–46.0)
Hemoglobin: 7.6 g/dL — ABNORMAL LOW (ref 12.0–15.0)

## 2024-11-13 MED ORDER — FUROSEMIDE 40 MG PO TABS
40.0000 mg | ORAL_TABLET | Freq: Every day | ORAL | Status: DC
  Administered 2024-11-13 – 2024-11-18 (×6): 40 mg via ORAL
  Filled 2024-11-13 (×6): qty 1

## 2024-11-13 NOTE — TOC Progression Note (Signed)
 Transition of Care [facility]) - Progression Note    Patient Details  Name: Tammy Arias MRN: [id number] Date of Birth: 1956-03-03  Transition of Care [facility]) CM/SW Contact  Lauraine JAYSON Carpen, LCSW Phone Number: 11/13/2024, 9:42 AM  Clinical Narrative:   TOC continues to follow progress.    Barriers to Discharge: Continued Medical Work up               Expected Discharge Plan and Services       Living arrangements for the past 2 months: Single Family Home                                       Social Drivers of Health (SDOH) Interventions SDOH Screenings   Food Insecurity: No Food Insecurity (10/29/2024)  Housing: Low Risk (10/29/2024)  Transportation Needs: No Transportation Needs (10/29/2024)  Utilities: Not At Risk (10/29/2024)  Depression (PHQ2-9): Low Risk (09/04/2024)  Financial Resource Strain: Medium Risk (05/20/2024)   Received from [facility]  Social Connections: Moderately Integrated (10/29/2024)  Tobacco Use: Medium Risk (10/27/2024)    Readmission Risk Interventions    10/28/2024    3:01 PM 08/16/2024    1:36 PM 06/11/2024    4:23 PM  Readmission Risk Prevention Plan  Transportation Screening Complete Complete Complete  Medication Review Oceanographer) Complete Complete Complete  PCP or Specialist appointment within 3-5 days of discharge Complete Complete   HRI or Home Care Consult   --  SW Recovery Care/Counseling Consult Complete Complete   Palliative Care Screening Not Applicable Not Applicable Not Applicable  Skilled Nursing Facility Not Applicable Not Applicable Not Applicable

## 2024-11-13 NOTE — Progress Notes (Addendum)
 [facility] CLINIC CARDIOLOGY PROGRESS NOTE   Patient ID: Tammy Arias MRN: [id number] DOB/AGE: 1955-12-19 68 y.o.  Admit date: 10/27/2024 Referring Physician Dr. Juanita Picking Primary Physician Valora Lynwood FALCON, MD  Primary Cardiologist Dr. Florencio Reason for Consultation atrial fibrillation, GI bleed  HPI: Tammy Arias is a 68 y.o. female with a past medical history of coronary artery disease s/p CABG, ischemic cardiomyopathy, chronic HFrEF, history of atrial fibrillation s/p failed Watchman, pulmonary hypertension, hx GI bleed who presented to the ED on 10/27/2024 for bloody drainage from ileostomy. Treated for hemorrhagic shock with improvement in this but with persistent hypotension and fluid retention. Cardiology was consulted for further evaluation.   Interval History:  -Patient seen and examined this AM, sitting upright in hospital bed. -BP remains stable, no complaints of dizziness. Cr relatively stable, without much improvement of LE edema. Endorses good UOP. -No significant events noted on tele.  -Hgb down yesterday evening but back up this AM.  Review of systems complete and found to be negative unless listed above   Vitals:   11/12/24 2258 11/13/24 0347 11/13/24 0500 11/13/24 0736  BP: (!) 93/50 (!) 94/54  (!) 98/49  Pulse: 80 78  82  Resp: 20 18    Temp: 98.6 F (37 C) 98.4 F (36.9 C)  98.3 F (36.8 C)  TempSrc:    Oral  SpO2: 100% 100%  100%  Weight:   65.3 kg   Height:         Intake/Output Summary (Last 24 hours) at 11/13/2024 0756 Last data filed at 11/12/2024 2200 Gross per 24 hour  Intake 360 ml  Output 1200 ml  Net -840 ml     PHYSICAL EXAM General: Chronically ill appearing female, well nourished, in no acute distress. HEENT: Normocephalic and atraumatic. Neck: No JVD.  Lungs: Normal respiratory effort on room air. Clear bilaterally to auscultation. No wheezes, crackles, rhonchi.  Heart: irregularly irregular, controlled rate. Normal S1  and S2 without gallops or murmurs. Radial & DP pulses 2+ bilaterally. Abdomen: Non-distended appearing.  Msk: Normal strength and tone for age. Extremities: No clubbing, cyanosis.  Pitting edema bilaterally. Neuro: Alert and oriented X 3. Psych: Mood appropriate, affect congruent.    LABS: Basic Metabolic Panel: Recent Labs    11/11/24 0430 11/12/24 0412 11/13/24 0446  NA 138 133* 129*  K 3.5 3.4* 4.6  CL 96* 93* 91*  CO2 32 31 27  GLUCOSE 65* 119* 136*  BUN 46* 41* 36*  CREATININE 1.37* 1.18* 1.22*  CALCIUM  9.2 8.7* 9.5  MG 1.3* 1.8  --   PHOS  --  3.3  --    Liver Function Tests: Recent Labs    11/12/24 0412  ALBUMIN  3.2*    No results for input(s): LIPASE, AMYLASE in the last 72 hours. CBC: Recent Labs    11/12/24 0810 11/12/24 1221 11/13/24 0446  WBC 3.4*  --   --   HGB 7.9* 6.9* 7.6*  HCT 23.7* 20.4* 23.3*  MCV 97.5  --   --   PLT 80*  --   --    Cardiac Enzymes: No results for input(s): CKTOTAL, CKMB, CKMBINDEX, TROPONINIHS in the last 72 hours. BNP: No results for input(s): BNP in the last 72 hours. D-Dimer: No results for input(s): DDIMER in the last 72 hours. Hemoglobin A1C: No results for input(s): HGBA1C in the last 72 hours. Fasting Lipid Panel: No results for input(s): CHOL, HDL, LDLCALC, TRIG, CHOLHDL, LDLDIRECT in the last 72 hours. Thyroid   Function Tests: No results for input(s): TSH, T4TOTAL, T3FREE, THYROIDAB in the last 72 hours.  Invalid input(s): FREET3  Anemia Panel: No results for input(s): VITAMINB12, FOLATE, FERRITIN, TIBC, IRON , RETICCTPCT in the last 72 hours.  No results found.   ECHO 07/2024: 1. Left ventricular ejection fraction, by estimation, is 35 to 40%. The left ventricle has moderately decreased function. The left ventricle demonstrates global hypokinesis. Left ventricular diastolic parameters are indeterminate.   2. Mildly D-shaped interventricular septum  suggestive of RV pressure/volume overload. Peak RV-RA gradient 36 mmHg. Right ventricular systolic function is moderately reduced. The right ventricular size is severely enlarged.   3. Left atrial size was severely dilated.   4. Right atrial size was severely dilated.   5. S/p mitral valve repair via mTEER with 1 Mitraclip, position appears  A2/P2. Mean gradient 5 mmHg. Moderate residual mitral regurgitation.   6. The tricuspid valve is abnormal. Tricuspid valve regurgitation is severe.   7. The aortic valve is tricuspid. There is mild calcification of the aortic valve. Aortic valve regurgitation is not visualized. No aortic stenosis is present.   8. The IVC was not visualized.   9. The patient was in atrial fibrillation.   TELEMETRY (personally reviewed): atrial fibrillation rate 80s  EKG (personally reviewed): Atrial fibrillation low voltage nonspecific ST-T wave changes 78 bpm  DATA reviewed by me 11/13/2024: last 24h vitals tele labs imaging I/O, hospitalist progress note, nephrology notes  Principal Problem:   Hemorrhagic shock (HCC) Active Problems:   Atrial fibrillation, chronic (HCC)   Type 2 diabetes mellitus (HCC)   OSA on CPAP   CAD with hx of CABG   Cirrhosis and Portal hypertension  on CT(HCC)   Acute on chronic HFrEF (heart failure with reduced ejection fraction) (HCC)   Hypothyroidism   Acute renal failure   Ileostomy in place [facility])   Esophageal varices without bleeding (HCC)   Protein calorie malnutrition   Shock (HCC)   Acute GI bleeding   Chronic kidney disease, stage 3b (HCC)   Leg edema   Malnutrition of moderate degree    ASSESSMENT AND PLAN: Tammy Arias is a 68 y.o. female with a past medical history of coronary artery disease s/p CABG, ischemic cardiomyopathy, chronic HFrEF, history of atrial fibrillation s/p failed Watchman, pulmonary hypertension, hx GI bleed who presented to the ED on 10/27/2024 for bloody drainage from ileostomy. Treated for  hemorrhagic shock with improvement in this but with persistent hypotension and fluid retention. Cardiology was consulted for further evaluation.   # Acute on chronic HFrEF # GI bleed, hemorrhagic shock # Coronary artery disease # Chronic atrial fibrillation -Diuresis as per nephrology. Appreciate their recommendations.  -BP unable to tolerate further GDMT. Continue spironolactone  25 mg twice daily, carvedilol  3.125 mg twice daily as BP tolerates. Can consider SGLT2i.  -Midodrine  per primary team. Weaned off pressors.  -Recommend Hgb goal >8 given underlying cardiac issues. -Given comorbidities, inability to tolerate anticoagulation, patient would likely not be a candidate for advanced therapies.   This patient's case was discussed and created with Dr. Florencio and he is in agreement.  Signed:  Danita Bloch, PA-C  11/13/2024, 7:56 AM [facility] Cardiology

## 2024-11-13 NOTE — Progress Notes (Signed)
 PROGRESS NOTE    Tammy Arias  FMW:[id number] DOB: [birth year] DOA: 10/27/2024 PCP: Valora Lynwood FALCON, MD  [id number]  LOS: 17 days   Brief hospital course:   Assessment & Plan: 68 y.o.female with medical problems of heart failure with reduced ejection fraction 25 to 30%, severe mitral regurgitation, moderate right ventricular systolic dysfunction, severe tricuspid regurgitation, atrial fibrillation, failed Watchman device, chronic anticoagulation with apixaban , GI bleed, coronary disease status post CABG in 2005, aortic stenosis repair, pulmonary hypertension, type 2 diabetes, hypertension, liver cirrhosis with grade 2 esophageal varices, C. 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     She reports to ED for generalized malaise and is being admitted for hypotension +GIB from ileostomy.  Patient initially admitted for hemorrhagic stroke got multiple IV fluid boluses, IV pressors with electrolyte derangements she required Levophed  and was in ICU.  There is a component of cardiogenic shock with extensive cardiac history.  Cardiology was consulted, weaned off pressors, doing good on IV Lasix  therapy with close monitoring of renal function by nephrology team. 12/16. She has bleeding from colostomy which is ongoing issue, no intervention per GI. Repeat Hb 6.9, 1 unit PRBC ordered.   Acute on chronic HFrEF: Cardiogenic shock-resolved EF 35 to 40%, moderate MR history of mitral valve repair, severe TR. Status post volume overloaded after fluid resuscitation. --received IV Lasix  40 mg twice daily per nephrology. --transition to oral lasix  40 mg daily today --cont aldactone  --hold coreg  due to hypotension  Hypotension --hold coreg  due to hypotension --cont midodrine    AKI on CKD stage III: Nephrology following. Creatinine improved to 1.18. --monitor Cr   Possible adrenal insufficiency: --s/p stress dose steroids    GI bleeding: History  of AVMs Esophageal varices History of recurrent GI bleeds- Does have on and off bleeding in the ostomy.  GI did not recommend any intervention. She received 3 units of PRBC total. --1u pRBC today --transfusion goal Hgb 8 --hematology follow up with Dr. Melanee as she may need outpatient transfusion.    Hyponatremia   Paroxysmal A-fib: status post Watchman procedure.   Not on anticoagulation secondary to GI bleed.   Thrombocytopenia-stable.   Cirrhosis-stable Vitamin K deficiency: On daily vitamin K p.o 2.5mg  daily.   Ileostomy status- History of C. 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   Hypothyroidism -continue home levothyroxine .   Type 2 diabetes mellitus --cont Lantus  15u nightly --mealtime 5u TID --ACHS and SSI   OSA -not compliant with CPAP.   Debility: Refused SNF, wishes to go home with home health.    DVT prophylaxis: SCD/Compression stockings Code Status: DNR  Family Communication:  Level of care: Med-Surg Dispo:   The patient is from: home Anticipated d/c is to: home Anticipated d/c date is: tomorrow   Subjective and Interval History:  Pt had no complaint today.  Ready to go home.   Objective: Vitals:   11/13/24 1153 11/13/24 1216 11/13/24 1646 11/13/24 1700  BP: (!) 98/46 (!) 109/57 (!) 112/41   Pulse: 76 74 75   Resp: 18  16 18   Temp: 98.2 F (36.8 C) 98.7 F (37.1 C) 98.5 F (36.9 C)   TempSrc:  Oral    SpO2: 100% 100% 100%   Weight:      Height:        Intake/Output Summary (Last 24 hours) at 11/13/2024 2034 Last data filed at 11/13/2024 1847 Gross per 24 hour  Intake  852 ml  Output 1100 ml  Net -248 ml   Filed Weights   11/11/24 0500 11/12/24 0500 11/13/24 0500  Weight: 62.8 kg 66.5 kg 65.3 kg    Examination:   Constitutional: NAD, AAOx3 HEENT: conjunctivae and lids normal, EOMI CV: No cyanosis.   RESP: normal respiratory effort, on RA Extremities: No  effusions, edema in BLE SKIN: warm, dry Neuro: II - XII grossly intact.   Psych: Normal mood and affect.  Appropriate judgement and reason   Data Reviewed: I have personally reviewed labs and imaging studies  Time spent: 50 minutes  Ellouise Haber, MD [facility] If 7PM-7AM, please contact night-coverage 11/13/2024, 8:34 PM

## 2024-11-13 NOTE — Progress Notes (Signed)
 Patient being transferred to 1 C room 101. Patient is very upset that she is being transferred in the middle of the night. States this is the 4th time she has been moved in the middle of the night, and why am I moving now I am being discharged in the AM. Report called to Kimberley RN.

## 2024-11-13 NOTE — Plan of Care (Signed)

## 2024-11-13 NOTE — Consult Note (Addendum)
 PHARMACY CONSULT NOTE - ELECTROLYTES  Pharmacy Consult for Electrolyte Monitoring and Replacement   Recent Labs: Potassium (mmol/L)  Date Value  11/13/2024 4.6   Magnesium  (mg/dL)  Date Value  87/83/7974 1.8   Calcium  (mg/dL)  Date Value  87/82/7974 9.5   Albumin  (g/dL)  Date Value  87/83/7974 3.2 (L)  05/18/2020 4.7   Phosphorus (mg/dL)  Date Value  87/83/7974 3.3   Sodium (mmol/L)  Date Value  11/13/2024 129 (L)   Height: 5' 1 (154.9 cm) Weight: 65.3 kg (143 lb 15.4 oz) IBW/kg (Calculated) : 47.8 Estimated Creatinine Clearance: 37.7 mL/min (A) (by C-G formula based on SCr of 1.22 mg/dL (H)).  Assessment  Tammy Arias is a 68 y.o. female presenting with acute HFrEF. PMH significant for CHF, Afib, GI bleeding, CABG, pHTN, liver cirrhosis, C diff s/p colectomy and end ileostomy. Pharmacy has been consulted to monitor and replace electrolytes.  Diet: Carb modified MIVF: N/A Pertinent medications: Lasix  40 mg IV BID, Aldactone  25 mg BID  Goal of Therapy: Electrolytes within normal limits  Plan:  No replacement needed, but Na is trending down. Will stop potassium 40 mEq daily as IV lasix  was stopped.  F/u with AM labs.    Thank you for allowing pharmacy to be a part of this patients care.  Louretta Tantillo S Ardie Mclennan 11/13/2024 6:56 AM

## 2024-11-13 NOTE — Progress Notes (Signed)
 Physical Therapy Treatment Patient Details Name: Tammy Arias MRN: [id number] DOB: [birth year] Today's Date: 11/13/2024   History of Present Illness 68 y.o.female with medical problems of heart failure with reduced ejection fraction 25 to 30%, severe mitral regurgitation, moderate right ventricular systolic dysfunction, severe tricuspid regurgitation, atrial fibrillation, failed Watchman device, chronic anticoagulation with apixaban , GI bleed, coronary disease status post CABG in 2005, aortic stenosis repair, pulmonary hypertension, type 2 diabetes, hypertension, liver cirrhosis with grade 2 esophageal varices, C. 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         She reports to ED for generalized malaise and is being admitted for hypotension  +GIB from ileostomy    PT Comments  Pt received in Semi-Fowler's position and agreeable to therapy.  Pt noted she was unsure if she was going to have her hemoglobin checked, but was ready to ambulate around the nursing station with therapy.  Pt performed well with ambulation and is able to perform without RW.  Pt then re-introduced to standing hip exercises in order to improve overall endurance levels and strengthening of the LE's.  Pt noted no discomfort and only limited by the ostomy bag.  Pt would benefit from having a printed version of the HEP for the next visit.  Pt returned to the bed and was left with all needs met.      If plan is discharge home, recommend the following: Assistance with cooking/housework;Assist for transportation;Help with stairs or ramp for entrance;A little help with walking and/or transfers;A little help with bathing/dressing/bathroom   Can travel by private vehicle        Equipment Recommendations       Recommendations for Other Services       Precautions / Restrictions Precautions Precautions: Fall Recall of Precautions/Restrictions: Intact Restrictions Weight Bearing  Restrictions Per Provider Order: No     Mobility  Bed Mobility Overal bed mobility: Modified Independent Bed Mobility: Sit to Supine, Supine to Sit     Supine to sit: Contact guard Sit to supine: Min assist   General bed mobility comments: pt able to transfer into standing position without any difficulty.    Transfers Overall transfer level: Modified independent Equipment used: None Transfers: Sit to/from Stand Sit to Stand: Supervision                Ambulation/Gait Ambulation/Gait assistance: Supervision Gait Distance (Feet): 180 Feet Assistive device: Rolling walker (2 wheels) Gait Pattern/deviations: Decreased step length - right, Decreased step length - left, Decreased stride length       General Gait Details: Pt performed well with ambulation attempt and is able to ambulate without the walker as well.   Stairs             Wheelchair Mobility     Tilt Bed    Modified Rankin (Stroke Patients Only)       Balance Overall balance assessment: Needs assistance Sitting-balance support: No upper extremity supported, Feet supported Sitting balance-Leahy Scale: Normal     Standing balance support: Bilateral upper extremity supported Standing balance-Leahy Scale: Good                              Communication Communication Communication: No apparent difficulties Factors Affecting Communication: Hearing impaired  Cognition Arousal: Alert Behavior During Therapy: WFL for tasks assessed/performed   PT - Cognitive impairments: No apparent impairments  Following commands: Intact      Cueing Cueing Techniques: Verbal cues  Exercises Standing hip abduction with UE supported at hand rail, x10 each LE Standing hip extension with UE supported at hand rail, x10 each LE Standing hip flexion/high knee marches with UE supported at hand rail, x10 each LE Standing hamstring curls with UE support at hand rail,  x10 each LE Standing calf raises with UE support at the hand rail, x10 Standing squats with UE supported at hand rail, x10 each LE     General Comments        Pertinent Vitals/Pain Pain Assessment Pain Assessment: No/denies pain    Home Living                          Prior Function            PT Goals (current goals can now be found in the care plan section) Acute Rehab PT Goals Patient Stated Goal: Go home (and not rehab) PT Goal Formulation: With patient Time For Goal Achievement: 11/12/24 Potential to Achieve Goals: Good Progress towards PT goals: Progressing toward goals    Frequency    Min 1X/week      PT Plan      Co-evaluation              AM-PAC PT 6 Clicks Mobility   Outcome Measure  Help needed turning from your back to your side while in a flat bed without using bedrails?: None Help needed moving from lying on your back to sitting on the side of a flat bed without using bedrails?: None Help needed moving to and from a bed to a chair (including a wheelchair)?: None Help needed standing up from a chair using your arms (e.g., wheelchair or bedside chair)?: None Help needed to walk in hospital room?: None Help needed climbing 3-5 steps with a railing? : A Little 6 Click Score: 23    End of Session Equipment Utilized During Treatment: Gait belt Activity Tolerance: Patient tolerated treatment well Patient left: in chair;with call bell/phone within reach Nurse Communication: Mobility status PT Visit Diagnosis: Muscle weakness (generalized) (M62.81);Difficulty in walking, not elsewhere classified (R26.2)     Time: 9667-9644 PT Time Calculation (min) (ACUTE ONLY): 23 min  Charges:    $Therapeutic Activity: 23-37 mins PT General Charges $$ ACUTE PT VISIT: 1 Visit                     Fonda Simpers, PT, DPT Physical Therapist - [facility]  11/13/2024, 5:08 PM    Fonda LELON Simpers 11/13/2024, 5:04 PM

## 2024-11-13 NOTE — Progress Notes (Signed)
 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 She reports to ED for generalized malaise and is being admitted for Hemorrhagic shock (HCC) [R57.8] Shock (HCC) [R57.9] Acute GI bleeding [K92.2] Acute renal failure, unspecified acute renal failure type [N17.9]  Update:  Creatinine was 1.2 this a.m. Hemoglobin was improved to 7.6 this a.m.   Objective:  Vital signs in last 24 hours:  Temp:  [97.9 F (36.6 C)-98.7 F (37.1 C)] 98.7 F (37.1 C) (12/17 1216) Pulse Rate:  [72-83] 74 (12/17 1216) Resp:  [13-20] 18 (12/17 1153) BP: (84-109)/(39-57) 109/57 (12/17 1216) SpO2:  [96 %-100 %] 100 % (12/17 1216) Weight:  [65.3 kg] 65.3 kg (12/17 0500)  Weight change: -1.2 kg Filed Weights   11/11/24 0500 11/12/24 0500 11/13/24 0500  Weight: 62.8 kg 66.5 kg 65.3 kg    Intake/Output: I/O last 3 completed shifts: In: 360 [P.O.:360] Out: 2250 [Urine:1250; Stool:1000]   Intake/Output this shift:  Total I/O In: 732 [P.O.:360; Blood:372] Out: -   Physical Exam: General: NAD  Head: Normocephalic, atraumatic. Moist oral mucosa  Eyes: Anicteric  Lungs:  Rhonchi/wheeze, normal effort  Heart: Regular rate and rhythm  Abdomen:  Soft, nontender. Ileostomy   Extremities: 2+ peripheral edema.  Neurologic: Awake, alert, conversant  Skin: Warm,dry, no rash  Access: None    Basic Metabolic Panel: Recent Labs   Lab 11/09/24 0402 11/10/24 0346 11/11/24 0430 11/12/24 0412 11/13/24 0446  NA 133* 136 138 133* 129*  K 3.3* 2.8* 3.5 3.4* 4.6  CL 92* 93* 96* 93* 91*  CO2 27 29 32 31 27  GLUCOSE 319* 115* 65* 119* 136*  BUN 47* 50* 46* 41* 36*  CREATININE 1.30* 1.38* 1.37* 1.18* 1.22*  CALCIUM  9.1 8.9 9.2 8.7* 9.5  MG  --   --  1.3* 1.8  --   PHOS  --   --   --  3.3  --     Liver Function Tests: Recent Labs  Lab 11/12/24 0412  ALBUMIN  3.2*    No results for input(s): LIPASE, AMYLASE in the last 168 hours.  No results for input(s): AMMONIA in the last 168 hours.  CBC: Recent Labs  Lab 11/07/24 0354 11/08/24 0424 11/09/24 0402 11/10/24 0346 11/11/24 1643 11/11/24 2230 11/12/24 0810 11/12/24 1221 11/13/24 0446  WBC 3.4* 2.6* 4.0 4.2  --   --  3.4*  --   --   HGB 10.0* 9.3* 9.9* 9.6* 9.1* 7.9* 7.9* 6.9* 7.6*  HCT 30.4* 28.3* 30.0* 29.1* 28.0* 24.9* 23.7* 20.4* 23.3*  MCV 95.9 96.3 96.5 95.7  --   --  97.5  --   --   PLT 67* 71* 83* 95*  --   --  80*  --   --     Cardiac Enzymes: No results for input(s): CKTOTAL, CKMB, CKMBINDEX, TROPONINI in the last 168 hours.  BNP: Invalid input(s): POCBNP  CBG: Recent Labs  Lab 11/12/24 1135 11/12/24 1627 11/12/24 2036 11/13/24 0737 11/13/24 1155  GLUCAP 243* 93 294* 141* 251*  Microbiology: Results for orders placed or performed during the hospital encounter of 10/27/24  Resp panel by RT-PCR (RSV, Flu A&B, Covid) Anterior Nasal Swab     Status: None   Collection Time: 10/27/24 11:01 AM   Specimen: Anterior Nasal Swab  Result Value Ref Range Status   SARS Coronavirus 2 by RT PCR NEGATIVE NEGATIVE Final    Comment: (NOTE) SARS-CoV-2 target nucleic acids are NOT DETECTED.  The SARS-CoV-2 RNA is generally detectable in upper respiratory specimens during the acute phase of infection. The lowest concentration of SARS-CoV-2 viral copies this assay can detect is 138 copies/mL. A negative result does not  preclude SARS-Cov-2 infection and should not be used as the sole basis for treatment or other patient management decisions. A negative result may occur with  improper specimen collection/handling, submission of specimen other than nasopharyngeal swab, presence of viral mutation(s) within the areas targeted by this assay, and inadequate number of viral copies(<138 copies/mL). A negative result must be combined with clinical observations, patient history, and epidemiological information. The expected result is Negative.  Fact Sheet for Patients:  bloggercourse.com  Fact Sheet for Healthcare Providers:  seriousbroker.it  This test is no t yet approved or cleared by the [place]  FDA and  has been authorized for detection and/or diagnosis of SARS-CoV-2 by FDA under an Emergency Use Authorization (EUA). This EUA will remain  in effect (meaning this test can be used) for the duration of the COVID-19 declaration under Section 564(b)(1) of the Act, 21 U.S.C.section 360bbb-3(b)(1), unless the authorization is terminated  or revoked sooner.       Influenza A by PCR NEGATIVE NEGATIVE Final   Influenza B by PCR NEGATIVE NEGATIVE Final    Comment: (NOTE) The Xpert Xpress SARS-CoV-2/FLU/RSV plus assay is intended as an aid in the diagnosis of influenza from Nasopharyngeal swab specimens and should not be used as a sole basis for treatment. Nasal washings and aspirates are unacceptable for Xpert Xpress SARS-CoV-2/FLU/RSV testing.  Fact Sheet for Patients: bloggercourse.com  Fact Sheet for Healthcare Providers: seriousbroker.it  This test is not yet approved or cleared by the [place]  FDA and has been authorized for detection and/or diagnosis of SARS-CoV-2 by FDA under an Emergency Use Authorization (EUA). This EUA will remain in effect (meaning this test can be used) for the  duration of the COVID-19 declaration under Section 564(b)(1) of the Act, 21 U.S.C. section 360bbb-3(b)(1), unless the authorization is terminated or revoked.     Resp Syncytial Virus by PCR NEGATIVE NEGATIVE Final    Comment: (NOTE) Fact Sheet for Patients: bloggercourse.com  Fact Sheet for Healthcare Providers: seriousbroker.it  This test is not yet approved or cleared by the [place]  FDA and has been authorized for detection and/or diagnosis of SARS-CoV-2 by FDA under an Emergency Use Authorization (EUA). This EUA will remain in effect (meaning this test can be used) for the duration of the COVID-19 declaration under Section 564(b)(1) of the Act, 21 U.S.C. section 360bbb-3(b)(1), unless the authorization is terminated or revoked.  Performed at [facility], [address] [place], [place] [address]   Blood Culture (routine x 2)     Status: None   Collection Time: 10/27/24 12:18 PM   Specimen: Right Antecubital; Blood  Result Value Ref Range Status   Specimen Description RIGHT ANTECUBITAL  Final   Special Requests   Final    BOTTLES DRAWN AEROBIC AND ANAEROBIC Blood Culture adequate volume   Culture   Final    NO  GROWTH 5 DAYS Performed at [facility], [address] [place], [place] [address]    Report Status 11/01/2024 FINAL  Final  Blood Culture (routine x 2)     Status: None   Collection Time: 10/27/24 12:18 PM   Specimen: Left Antecubital; Blood  Result Value Ref Range Status   Specimen Description LEFT ANTECUBITAL  Final   Special Requests   Final    BOTTLES DRAWN AEROBIC AND ANAEROBIC Blood Culture results may not be optimal due to an inadequate volume of blood received in culture bottles   Culture   Final    NO GROWTH 5 DAYS Performed at [facility], [address], [place], [place] [address]    Report Status 11/01/2024 FINAL  Final  MRSA Next Gen by PCR, Nasal     Status:  Abnormal   Collection Time: 10/27/24  1:38 PM   Specimen: Nasal Mucosa; Nasal Swab  Result Value Ref Range Status   MRSA by PCR Next Gen DETECTED (A) NOT DETECTED Final    Comment: RESULT CALLED TO, READ BACK BY AND VERIFIED WITH: KATHERINE CLAYTON @1520  10/27/24 MJU (NOTE) The GeneXpert MRSA Assay (FDA approved for NASAL specimens only), is one component of a comprehensive MRSA colonization surveillance program. It is not intended to diagnose MRSA infection nor to guide or monitor treatment for MRSA infections. Test performance is not FDA approved in patients less than 20 years old. Performed at [facility], [address] [place], [place] [address]     Coagulation Studies: No results for input(s): LABPROT, INR in the last 72 hours.   Urinalysis: No results for input(s): COLORURINE, LABSPEC, PHURINE, GLUCOSEU, HGBUR, BILIRUBINUR, KETONESUR, PROTEINUR, UROBILINOGEN, NITRITE, LEUKOCYTESUR in the last 72 hours.  Invalid input(s): APPERANCEUR     Imaging: No results found.     Medications:      sodium chloride    Intravenous Once   allopurinol   300 mg Oral Daily   alteplase   2 mg Intracatheter Once   Chlorhexidine  Gluconate Cloth  6 each Topical Nightly   feeding supplement (GLUCERNA SHAKE)  237 mL Oral TID BM   furosemide   40 mg Oral Daily   insulin  aspart  0-15 Units Subcutaneous TID WC   insulin  aspart  0-5 Units Subcutaneous QHS   insulin  aspart  5 Units Subcutaneous TID WC   insulin  glargine  15 Units Subcutaneous QHS   latanoprost   1 drop Both Eyes QHS   levothyroxine   50 mcg Oral QAC breakfast   midodrine   15 mg Oral TID WC   multivitamin with minerals  1 tablet Oral Daily   pantoprazole   40 mg Oral BID   phytonadione   2.5 mg Oral Daily   rosuvastatin   5 mg Oral Daily   sodium chloride  flush  3 mL Intravenous Q12H   spironolactone   25 mg Oral BID   acetaminophen , alum & mag hydroxide-simeth, docusate sodium ,  ipratropium-albuterol , magnesium  hydroxide, ondansetron  (ZOFRAN ) IV, mouth rinse, polyethylene glycol, sodium chloride  flush, traZODone   Assessment/ Plan:  Ms. Tammy Arias is a 68 y.o.  female presents to ED with weakness and has been admitted for Hemorrhagic shock (HCC) [R57.8] Shock (HCC) [R57.9] Acute GI bleeding [K92.2] Acute renal failure, unspecified acute renal failure type [N17.9]   Acute Kidney Injury - Patient has baseline creatinine 0.92 on 08/22/24.  Acute kidney injury appears multifactorial at this time from prior hypotension and dehydration. Potassium 6.1 on admission. Corrected with shifting measures.  Required CRRT from 12/1 to 12/3.  Renal function relatively stable.  Creatinine currently 1.2.  Continue to monitor renal parameters daily.  Lab Results  Component Value Date   CREATININE 1.22 (H) 11/13/2024   CREATININE 1.18 (H) 11/12/2024   CREATININE 1.37 (H) 11/11/2024    Intake/Output Summary (Last 24 hours) at 11/13/2024 1646 Last data filed at 11/13/2024 1300 Gross per 24 hour  Intake 732 ml  Output 300 ml  Net 432 ml    2. Hypokalemia Likely secondary to aggressive IV diuresis. Serum potassium at target of 4.6.  Continue spironolactone .  3. LE edema Maintain the patient on Lasix  40 mg twice daily as well as spironolactone  25 mg twice daily.   4. Anemia with suspected acute blood loss Lab Results  Component Value Date   HGB 7.6 (L) 11/13/2024  Has received blood transfusions during this admission. GI has no plans to repeat EGD due to current and recent workups.  Hemoglobin improved to 7.6 today.     LOS: 17 Trevor Duty 12/17/20254:46 PM

## 2024-11-14 ENCOUNTER — Inpatient Hospital Stay

## 2024-11-14 DIAGNOSIS — R578 Other shock: Secondary | ICD-10-CM | POA: Diagnosis not present

## 2024-11-14 LAB — BASIC METABOLIC PANEL WITH GFR
Anion gap: 9 (ref 5–15)
BUN: 35 mg/dL — ABNORMAL HIGH (ref 8–23)
CO2: 28 mmol/L (ref 22–32)
Calcium: 8.8 mg/dL — ABNORMAL LOW (ref 8.9–10.3)
Chloride: 94 mmol/L — ABNORMAL LOW (ref 98–111)
Creatinine, Ser: 1.28 mg/dL — ABNORMAL HIGH (ref 0.44–1.00)
GFR, Estimated: 45 mL/min — ABNORMAL LOW (ref >60)
Glucose, Bld: 150 mg/dL — ABNORMAL HIGH (ref 70–99)
Potassium: 5.3 mmol/L — ABNORMAL HIGH (ref 3.5–5.1)
Sodium: 130 mmol/L — ABNORMAL LOW (ref 135–145)

## 2024-11-14 LAB — GLUCOSE, CAPILLARY
Glucose-Capillary: 153 mg/dL — ABNORMAL HIGH (ref 70–99)
Glucose-Capillary: 156 mg/dL — ABNORMAL HIGH (ref 70–99)
Glucose-Capillary: 164 mg/dL — ABNORMAL HIGH (ref 70–99)
Glucose-Capillary: 176 mg/dL — ABNORMAL HIGH (ref 70–99)
Glucose-Capillary: 240 mg/dL — ABNORMAL HIGH (ref 70–99)

## 2024-11-14 LAB — HEMOGLOBIN: Hemoglobin: 6.6 g/dL — ABNORMAL LOW (ref 12.0–15.0)

## 2024-11-14 LAB — MAGNESIUM: Magnesium: 1.7 mg/dL (ref 1.7–2.4)

## 2024-11-14 MED ORDER — SODIUM CHLORIDE 0.9% IV SOLUTION: Freq: Once | INTRAVENOUS | Status: AC

## 2024-11-14 MED ORDER — MAGNESIUM SULFATE IN D5W 1-5 GM/100ML-% IV SOLN
1.0000 g | Freq: Once | INTRAVENOUS | Status: AC
  Administered 2024-11-14: 09:00:00 1 g via INTRAVENOUS
  Filled 2024-11-14: qty 100

## 2024-11-14 MED ORDER — METHOCARBAMOL 500 MG PO TABS
500.0000 mg | ORAL_TABLET | Freq: Once | ORAL | Status: AC
  Administered 2024-11-14: 15:00:00 500 mg via ORAL
  Filled 2024-11-14: qty 1

## 2024-11-14 MED ORDER — IOHEXOL 350 MG/ML SOLN
100.0000 mL | Freq: Once | INTRAVENOUS | Status: AC | PRN
  Administered 2024-11-14: 16:00:00 100 mL via INTRAVENOUS

## 2024-11-14 NOTE — Consult Note (Signed)
 PHARMACY CONSULT NOTE - ELECTROLYTES  Pharmacy Consult for Electrolyte Monitoring and Replacement   Recent Labs: Potassium (mmol/L)  Date Value  11/14/2024 5.3 (H)   Magnesium  (mg/dL)  Date Value  87/81/7974 1.7   Calcium  (mg/dL)  Date Value  87/81/7974 8.8 (L)   Albumin  (g/dL)  Date Value  87/83/7974 3.2 (L)  05/18/2020 4.7   Phosphorus (mg/dL)  Date Value  87/83/7974 3.3   Sodium (mmol/L)  Date Value  11/14/2024 130 (L)   Height: 5' 1 (154.9 cm) Weight: 65.3 kg (143 lb 15.4 oz) IBW/kg (Calculated) : 47.8 Estimated Creatinine Clearance: 35.9 mL/min (A) (by C-G formula based on SCr of 1.28 mg/dL (H)).  Assessment  Tammy Arias is a 68 y.o. female presenting with acute HFrEF. PMH significant for CHF, Afib, GI bleeding, CABG, pHTN, liver cirrhosis, C diff s/p colectomy and end ileostomy. Pharmacy has been consulted to monitor and replace electrolytes.  Diet: Carb modified MIVF: N/A Pertinent medications: Lasix  40 mg po, Aldactone  25 mg BID  Goal of Therapy: Electrolytes within normal limits  Plan:  Mg 1.7. Will Order Magnesium  1g IV x 1 dose.  No additional replacement warranted at this time.  F/u with AM labs.   Thank you for allowing pharmacy to be a part of this patients care.  Estill CHRISTELLA Lutes, PharmD, BCPS Clinical Pharmacist 11/14/2024 7:26 AM

## 2024-11-14 NOTE — Progress Notes (Signed)
 PROGRESS NOTE    Tammy Arias  FMW:[id number] DOB: 1956/09/28 DOA: 10/27/2024 PCP: Valora Lynwood FALCON, MD  [id number]  LOS: 18 days   Brief hospital course:   Assessment & Plan: 68 y.o.female with medical problems of heart failure with reduced ejection fraction 25 to 30%, severe mitral regurgitation, moderate right ventricular systolic dysfunction, severe tricuspid regurgitation, atrial fibrillation, failed Watchman device, chronic anticoagulation with apixaban , GI bleed, coronary disease status post CABG in 2005, aortic stenosis repair, pulmonary hypertension, type 2 diabetes, hypertension, liver cirrhosis with grade 2 esophageal varices, C. 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     She reports to ED for generalized malaise and is being admitted for hypotension +GIB from ileostomy.  Patient initially admitted for hemorrhagic stroke got multiple IV fluid boluses, IV pressors with electrolyte derangements she required Levophed  and was in ICU.  There is a component of cardiogenic shock with extensive cardiac history.  Cardiology was consulted, weaned off pressors, doing good on IV Lasix  therapy with close monitoring of renal function by nephrology team. 12/16. She has bleeding from colostomy which is ongoing issue, no intervention per GI. Repeat Hb 6.9, 1 unit PRBC ordered.   Acute on chronic HFrEF: Cardiogenic shock-resolved EF 35 to 40%, moderate MR history of mitral valve repair, severe TR. Status post volume overloaded after fluid resuscitation. --received IV Lasix  40 mg twice daily per nephrology. --transition to oral lasix  40 mg daily on 12/17 --d/c aldactone  --hold coreg  due to hypotension  Hypotension --cont midodrine    AKI on CKD stage III: Nephrology following. Creatinine improved to 1.18. --monitor Cr   Possible adrenal insufficiency: --s/p stress dose steroids    GI bleeding: History of AVMs Esophageal  varices History of recurrent GI bleeds- Does have on and off blood in the ostomy, substantial amount.  Pt has had extensive GI workup in the past, GI has signed off. --6u pRBC so far --7th unit pRBC today --GenSurg consult today --CT angio bleeding scan today --hematology follow up with Dr. Melanee as she may need outpatient transfusion.    Hyponatremia  Hyperkalemia   Paroxysmal A-fib: status post Watchman procedure.   Not on anticoagulation secondary to GI bleed.   Thrombocytopenia-stable.   Cirrhosis-stable  Vitamin K deficiency: --cont oral vit K daily   Ileostomy status- History of C. 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   Type 2 diabetes mellitus --cont Lantus  15u nightly --mealtime 5u TID --ACHS and SSI   OSA -not compliant with CPAP.   Debility: Refused SNF, wishes to go home with home health.    DVT prophylaxis: SCD/Compression stockings Code Status: DNR  Family Communication:  Level of care: Med-Surg Dispo:   The patient is from: home Anticipated d/c is to: home Anticipated d/c date is: 2-3 days   Subjective and Interval History:  Pt had bleeding into ostomy bag today.  Hgb dropped again requiring more blood transfusion.  GenSurg consult today.   Objective: Vitals:   11/14/24 1100 11/14/24 1240 11/14/24 1413 11/14/24 1707  BP: (!) 98/36 (!) 114/53 (!) 99/34 (!) 106/47  Pulse: 82 81 87 92  Resp: 18  18 16   Temp: 97.9 F (36.6 C)  97.9 F (36.6 C) 98.6 F (37 C)  TempSrc: Oral  Oral   SpO2: 100% 100% 100% 100%  Weight:      Height:        Intake/Output  Summary (Last 24 hours) at 11/14/2024 1929 Last data filed at 11/14/2024 1905 Gross per 24 hour  Intake 1130 ml  Output 1750 ml  Net -620 ml   Filed Weights   11/11/24 0500 11/12/24 0500 11/13/24 0500  Weight: 62.8 kg 66.5 kg 65.3 kg    Examination:   Constitutional: NAD,  AAOx3 HEENT: conjunctivae and lids normal, EOMI CV: No cyanosis.   RESP: normal respiratory effort, on RA Neuro: II - XII grossly intact.   Psych: Normal mood and affect.  Appropriate judgement and reason   Data Reviewed: I have personally reviewed labs and imaging studies  Time spent: 50 minutes  Ellouise Haber, MD [facility] If 7PM-7AM, please contact night-coverage 11/14/2024, 7:29 PM

## 2024-11-14 NOTE — Consult Note (Signed)
 [facility] SURGICAL CONSULTATION NOTE (initial) - cpt: [id number]   HISTORY OF PRESENT ILLNESS (HPI):  68 y.o. female presented to [facility] ED initially on 11/30 secondary to bloody output from her ileostomy. At the time of presentation, she was reporting clots and bright red blood in her bag. She had an admission in September for circulatory shock from bleeding but no source was identified at that time. She was hypotensive at the time of presentation. She has received 3 units pRBCs this admission. She has been evaluated by GI this admission. She has undergone extensive evaluation in the last year including EGD in May, August, and September of this year. She has also undergone capsule endoscopy without evidence of bleeding. 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 She is also being followed by cardiology currently for chronic atrial fibrillation, CHF, CAD. She is of course unable to get anticoagulation given her GI bleeding. Her most recent labs show a Hgb to 6.6 (from 7.6 yesterday), sCr - 1.28, hyperkalemia to 5.3. CT Abdomen/Pelvis from 12/01 reviewed with some ascites but otherwise no acute findings.   This afternoon, she continues to have frankly melanotic stool in her ileostomy bag.   Surgery is consulted by hospitalist physician Dr. Ellouise Haber, MD in this context for evaluation and management of GI bleeding thought to be from ileostomy.  PAST MEDICAL HISTORY (PMH):  Past Medical History:  Diagnosis Date   AC (acromioclavicular) joint bone spurs, right    Anemia    Arthritis    Atrial fibrillation (HCC)    a.) CHA2DS2-VASc = 7 (age, sex, HFrEF, HTN, TIA x 2, T2DM). b.) rate/rhythm controlled on oral carvedilol ; chronically anticoagulated with warfarin.   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 b.) R/LHC 03/29/2017: small RCA with occluded SVG; no significant Dz. Chronically occluded LAD with patent SVG to D1/LAD. Insignificant Dz in LCx. LM normal.   Cortical cataract    DOE (dyspnea on exertion)    DOE (dyspnea on exertion)    GERD (gastroesophageal reflux disease)    HFrEF (heart failure with reduced ejection fraction) (HCC)    a.) TTE 10/27/2014: EF 40%; glob HK; mild BAE; triv AR, mild MR/PR, mod TR.  b.) TTE 03/15/2017: EF 30%; glob HK; mod BAE; mod pHTN (RVSP 54.8 mmHg); mild PR, mod MR; sev TR.  c.) R/LHC 03/29/2017: EF 30-35%. d.)TTE 10/25/2018: EF 35%; mod BAE; mod BVE; glob HK; triv PR, mod MR, sev TR; RVSP 57.7 mmHg; G2DD.   History of shingles 2004   Hyperlipidemia    Hypertension    Hypothyroidism    IBS (irritable bowel syndrome)    Ischemic cardiomyopathy    a.) TTE 10/27/2014: EF 40%. b.) TTE 03/15/2017: EF 30%. c.) R/LHC 04/01/2017: EF 30-35%. d.) TTE 10/25/2018: EF 35%   Lower GI bleed 06/07/2023   Lumbar scoliosis    Lumbar spinal stenosis    Melena 12/17/2018   Migraines    Osteoporosis    Pancolitis 02/28/2024   Pulmonary HTN (HCC)    a.) TTE 03/15/2017: EF 30-35%; RVSP 54.8 mmHg. 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   Sleep apnea    T2DM (type 2 diabetes mellitus) (HCC)    TIA (transient ischemic attack) 05/29/2016   Vitamin B 12 deficiency      PAST SURGICAL HISTORY (PSH):  Past Surgical History:  Procedure Laterality Date   CARDIAC CATHETERIZATION     CATARACT EXTRACTION W/ INTRAOCULAR LENS IMPLANT Bilateral    Cataract Extraction with IOL   COLECTOMY  02/28/2024   Procedure: COLECTOMY, TOTAL;  Surgeon: Lane Shope, MD;  Location: [facility] ORS;  Service: General;;   COLONOSCOPY N/A 12/19/2018    Procedure: COLONOSCOPY;  Surgeon: Janalyn Keene NOVAK, MD;  Location: [facility] ENDOSCOPY;  Service: Endoscopy;  Laterality: N/A;   COLONOSCOPY WITH PROPOFOL  N/A 06/17/2020   Procedure: COLONOSCOPY WITH PROPOFOL ;  Surgeon: Janalyn Keene NOVAK, MD;  Location: [facility] ENDOSCOPY;  Service: Endoscopy;  Laterality: N/A;   COLONOSCOPY WITH PROPOFOL  N/A 03/30/2023   Procedure: COLONOSCOPY WITH PROPOFOL ;  Surgeon: Onita Elspeth Sharper, DO;  Location: [facility] ENDOSCOPY;  Service: Endoscopy;  Laterality: N/A;   COR TRIATRIATUM REPAIR N/A 01/28/2004   CORONARY ARTERY BYPASS GRAFT N/A 01/28/2004   3v CABG   ESOPHAGOGASTRODUODENOSCOPY N/A 12/19/2018   Procedure: ESOPHAGOGASTRODUODENOSCOPY (EGD);  Surgeon: Janalyn Keene NOVAK, MD;  Location: [facility] ENDOSCOPY;  Service: Endoscopy;  Laterality: N/A;   ESOPHAGOGASTRODUODENOSCOPY N/A 06/11/2024   Procedure: EGD (ESOPHAGOGASTRODUODENOSCOPY);  Surgeon: Jinny Carmine, MD;  Location: [facility] ENDOSCOPY;  Service: Endoscopy;  Laterality: N/A;   ESOPHAGOGASTRODUODENOSCOPY N/A 07/18/2024   Procedure: EGD (ESOPHAGOGASTRODUODENOSCOPY);  Surgeon: Onita Elspeth Sharper, DO;  Location: [facility] ENDOSCOPY;  Service: Gastroenterology;  Laterality: N/A;  with Enteroscopy        DM on Plavix   ESOPHAGOGASTRODUODENOSCOPY (EGD) WITH PROPOFOL  N/A 06/17/2020   Procedure: ESOPHAGOGASTRODUODENOSCOPY (EGD) WITH PROPOFOL ;  Surgeon: Janalyn Keene NOVAK, MD;  Location: [facility] ENDOSCOPY;  Service: Endoscopy;  Laterality: N/A;   ESOPHAGOGASTRODUODENOSCOPY (EGD) WITH PROPOFOL  N/A 03/30/2023   Procedure: ESOPHAGOGASTRODUODENOSCOPY (EGD) WITH PROPOFOL ;  Surgeon: Onita Elspeth Sharper, DO;  Location: [facility] ENDOSCOPY;  Service: Endoscopy;  Laterality: N/A;   GIVENS CAPSULE STUDY N/A 07/21/2024   Procedure: IMAGING PROCEDURE, GI TRACT, INTRALUMINAL, VIA CAPSULE;  Surgeon: Therisa Bi, MD;  Location: [facility] ENDOSCOPY;  Service: Gastroenterology;  Laterality: N/A;   LAPAROTOMY N/A 02/28/2024   Procedure: LAPAROTOMY,  EXPLORATORY;  Surgeon: Lane Shope, MD;  Location: [facility] ORS;  Service: General;  Laterality: N/A;   LEFT ATRIAL APPENDAGE OCCLUSION     RIGHT/LEFT HEART CATH AND CORONARY ANGIOGRAPHY Bilateral 03/29/2017   Procedure: Right/Left Heart Cath and Coronary Angiography;  Surgeon: Vinie DELENA Jude, MD;  Location: [facility] INVASIVE CV LAB;  Service: Cardiovascular;  Laterality: Bilateral;   TOTAL KNEE ARTHROPLASTY Right 04/05/2022   Procedure: TOTAL KNEE ARTHROPLASTY;  Surgeon: Kathlynn Sharper, MD;  Location: [facility] ORS;  Service: Orthopedics;  Laterality: Right;   TUBAL LIGATION     VENTRAL HERNIA REPAIR N/A 04/21/2017   Procedure: HERNIA REPAIR VENTRAL ADULT;  Surgeon: Claudene Larinda Bolder, MD;  Location: [facility] ORS;  Service: General;  Laterality: N/A;     MEDICATIONS:  Prior to Admission medications  Medication Sig Start Date End Date Taking? Authorizing Provider  albuterol  (PROVENTIL  HFA;VENTOLIN  HFA) 108 (90 Base) MCG/ACT inhaler Inhale 2 puffs into the lungs every 6 (six) hours as needed for wheezing or shortness of breath.   Yes [provider]  allopurinol  (ZYLOPRIM ) 300 MG tablet Take 300 mg by mouth daily.   Yes [provider]  cyanocobalamin  (VITAMIN B12) 1000 MCG tablet Take 1,000 mcg by mouth daily.   Yes [provider]  empagliflozin  (JARDIANCE ) 10 MG TABS tablet Take 1 tablet by mouth daily. 04/17/24  Yes [provider]  fluticasone  (FLONASE ) 50 MCG/ACT nasal spray Place 2 sprays into the nose daily. 05/20/24  Yes [provider]  iron  polysaccharides (NIFEREX) 150 MG capsule Take 1 capsule (150 mg total) by mouth daily. 08/22/24 11/20/24 Yes Maree Hue, MD  latanoprost  (XALATAN ) 0.005 % ophthalmic solution Place 1 drop into both eyes at bedtime.   Yes [provider]  levothyroxine  (SYNTHROID ) 50 MCG tablet Take 50 mcg by mouth daily before breakfast.   Yes [provider]  magnesium  oxide (MAG-OX) 400 MG tablet Take 400 mg by  mouth. Take 1 tablet (400 mg total) by mouth once daily 05/20/24 05/20/25 Yes [provider]  metFORMIN  (GLUCOPHAGE ) 1000 MG tablet Take 1,000 mg by mouth 2 (two) times daily with a meal.   Yes [provider]  nadolol (CORGARD) 20 MG tablet Take 20 mg by mouth daily. 08/12/24 08/12/25 Yes [provider]  pantoprazole  (PROTONIX ) 40 MG tablet Take 40 mg by mouth daily. 01/28/21  Yes [provider]  rosuvastatin  (CRESTOR ) 5 MG tablet Take 5 mg by mouth daily. 02/20/24 02/19/25 Yes [provider]  spironolactone  (ALDACTONE ) 25 MG tablet Take 0.5 tablets (12.5 mg total) by mouth daily. 03/31/24  Yes Fausto Burnard LABOR, DO  torsemide  (DEMADEX ) 20 MG tablet Take 1 tablet (20 mg total) by mouth daily as needed (for weight gain or swelling). 03/31/24  Yes Fausto Burnard A, DO  [Paused] aspirin  EC 81 MG tablet Take 81 mg by mouth daily. Swallow whole. Wait to take this until your doctor or other care provider tells you to start again.    [provider]  Blood Glucose Monitoring Suppl (ACCU-CHEK GUIDE ME) w/Device KIT  08/01/24   [provider]  [Paused] carvedilol  (COREG ) 3.125 MG tablet Take 3.125 mg by mouth 2 (two) times daily with a meal. Wait to take this until your doctor or other care provider tells you to start again.    [provider]     ALLERGIES:  Allergies[1]   SOCIAL HISTORY:  Social History   Socioeconomic History   Marital status: Widowed    Spouse name: Not on file   Number of children: Not on file   Years of education: Not on file   Highest education level: Not on file  Occupational History   Occupation: certified nursing assistant  Tobacco Use   Smoking status: Former    Current packs/day: 0.00    Types: Cigarettes    Quit date: 03/30/1999    Years since quitting: 25.6   Smokeless tobacco: Never  Vaping Use   Vaping status: Never Used  Substance and Sexual Activity   Alcohol use: No   Drug use: No    Sexual activity: Not Currently  Other Topics Concern   Not on file  Social History Narrative   Not on file   Social Drivers of Health   Tobacco Use: Medium Risk (10/27/2024)   Patient History    Smoking Tobacco Use: Former    Smokeless Tobacco Use: Never    Passive Exposure: Not on file  Financial Resource Strain: Medium Risk (05/20/2024)   Received from [facility]   Overall Financial Resource Strain (CARDIA)    Difficulty of Paying Living Expenses: Somewhat hard  Food Insecurity: No Food Insecurity (  10/29/2024)   Epic    Worried About Programme Researcher, Broadcasting/film/video in the Last Year: Never true    Ran Out of Food in the Last Year: Never true  Transportation Needs: No Transportation Needs (10/29/2024)   Epic    Lack of Transportation (Medical): No    Lack of Transportation (Non-Medical): No  Physical Activity: Not on file  Stress: Not on file  Social Connections: Moderately Integrated (10/29/2024)   Social Connection and Isolation Panel    Frequency of Communication with Friends and Family: Three times a week    Frequency of Social Gatherings with Friends and Family: Three times a week    Attends Religious Services: More than 4 times per year    Active Member of Clubs or Organizations: Yes    Attends Banker Meetings: More than 4 times per year    Marital Status: Widowed  Intimate Partner Violence: Not At Risk (10/29/2024)   Epic    Fear of Current or Ex-Partner: No    Emotionally Abused: No    Physically Abused: No    Sexually Abused: No  Depression (PHQ2-9): Low Risk (09/04/2024)   Depression (PHQ2-9)    PHQ-2 Score: 0  Alcohol Screen: Not on file  Housing: Low Risk (10/29/2024)   Epic    Unable to Pay for Housing in the Last Year: No    Number of Times Moved in the Last Year: 0    Homeless in the Last Year: No  Utilities: Not At Risk (10/29/2024)   Epic    Threatened with loss of utilities: No  Health Literacy: Not on file     FAMILY HISTORY:   Family History  Problem Relation Age of Onset   Valvular heart disease Mother    Diabetes Father    Cancer Sister        lung   Cancer Sister        cervical   Breast cancer Cousin       REVIEW OF SYSTEMS:  Review of Systems  Constitutional:  Negative for chills and fever.  Respiratory:  Negative for cough and shortness of breath.   Cardiovascular:  Negative for chest pain and palpitations.  Gastrointestinal:  Positive for blood in stool. Negative for abdominal pain, diarrhea, nausea and vomiting.  All other systems reviewed and are negative.   VITAL SIGNS:  Temp:  [97.7 F (36.5 C)-98.5 F (36.9 C)] 97.9 F (36.6 C) (12/18 1100) Pulse Rate:  [75-87] 81 (12/18 1240) Resp:  [16-20] 18 (12/18 1100) BP: (85-114)/(34-64) 114/53 (12/18 1240) SpO2:  [98 %-100 %] 100 % (12/18 1240)     Height: 5' 1 (154.9 cm) Weight: 65.3 kg BMI (Calculated): 27.22   INTAKE/OUTPUT:  12/17 0701 - 12/18 0700 In: 852 [P.O.:480; Blood:372] Out: 2550 [Urine:1200; Stool:1350]  PHYSICAL EXAM:  Physical Exam Vitals and nursing note reviewed. Exam conducted with a chaperone present.  Constitutional:      General: She is not in acute distress.    Appearance: Normal appearance. She is not ill-appearing.  HENT:     Head: Normocephalic and atraumatic.  Eyes:     Conjunctiva/sclera: Conjunctivae normal.  Cardiovascular:     Rate and Rhythm: Normal rate.     Pulses: Normal pulses.  Pulmonary:     Effort: Pulmonary effort is normal. No respiratory distress.  Abdominal:     General: A surgical scar is present. The ostomy site is clean. There is no distension.     Palpations: Abdomen is  soft.     Tenderness: There is no abdominal tenderness. There is no guarding or rebound.     Comments: Abdomen is soft, non-tender. Ileostomy in right abdomen, this is pink, I was able to digitize this without blood in os. There was frankly melanotic stool in bag. After cleaning ostomy and area, no obvious bleeding at  the surface.   Genitourinary:    Comments: Deferred Skin:    General: Skin is dry.  Neurological:     General: No focal deficit present.     Mental Status: She is alert and oriented to person, place, and time.  Psychiatric:        Mood and Affect: Mood normal.        Behavior: Behavior normal.     Ileostomy (11/14/2024 - 1430):    Labs:     Latest Ref Rng & Units 11/14/2024    5:24 AM 11/13/2024    4:46 AM 11/12/2024   12:21 PM  CBC  Hemoglobin 12.0 - 15.0 g/dL 6.6  7.6  6.9   Hematocrit 36.0 - 46.0 %  23.3  20.4       Latest Ref Rng & Units 11/14/2024    5:24 AM 11/13/2024    4:46 AM 11/12/2024    4:12 AM  CMP  Glucose 70 - 99 mg/dL 849  863  880   BUN 8 - 23 mg/dL 35  36  41   Creatinine 0.44 - 1.00 mg/dL 8.71  8.77  8.81   Sodium 135 - 145 mmol/L 130  129  133   Potassium 3.5 - 5.1 mmol/L 5.3  4.6  3.4   Chloride 98 - 111 mmol/L 94  91  93   CO2 22 - 32 mmol/L 28  27  31    Calcium  8.9 - 10.3 mg/dL 8.8  9.5  8.7      Imaging studies:  No new pertinent imaging studies    Assessment/Plan:  68 y.o. female with GI bleeding, unclear source, history of end ileostomy, complicated by pertinent comorbidities including significant cardiac disease.   - Patient evaluated at bedside with hospitalist physician. She certainly has frankly melanotic stool in her ileostomy bag. I was able to remove this and clean area without frank bleeding identified. Ileostomy was digitized as well and there was no frank blood.   - We will obtain nuclear medicine bleeding scan to try and localize this area of bleeding if possible. Of course, if bleeding has stopped, this will be limited. CTA may be another option if kidney function allows this. Last imaging was 12/01  - I do think surgical intervention in this setting should be last resort. She carriers significant risk including from a cardiac perspective and given her previous surgeries. Additionally, we would need to have a targeted site and  have exhausted all less invasive measures prior to considering this.   - Monitor H&H; transfuse as needed - getting 1 unit pRBCs   - Monitor ileostomy output - Monitor abdominal examination   - Further management per primary service; we will follow   All of the above findings and recommendations were discussed with the patient and her family (via telephone), and all of their questions were answered to their expressed satisfaction.  Thank you for the opportunity to participate in this patient's care.   -- Arthea Platt, PA-C [facility] 11/14/2024, 1:14 PM M-F: 7am - 4pm     [1]  Allergies Allergen Reactions   Vioxx [Rofecoxib] Swelling

## 2024-11-14 NOTE — Plan of Care (Signed)

## 2024-11-14 NOTE — Progress Notes (Signed)
 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 She reports to ED for generalized malaise and is being admitted for Hemorrhagic shock (HCC) [R57.8] Shock (HCC) [R57.9] Acute GI bleeding [K92.2] Acute renal failure, unspecified acute renal failure type [N17.9]  Update:  Continues to have periodic GI bleeding. Most recent hemoglobin was 6.6. Blood transfusion ordered..   Objective:  Vital signs in last 24 hours:  Temp:  [97.7 F (36.5 C)-98.6 F (37 C)] 98.6 F (37 C) (12/18 1707) Pulse Rate:  [75-92] 92 (12/18 1707) Resp:  [16-20] 16 (12/18 1707) BP: (85-114)/(34-64) 106/47 (12/18 1707) SpO2:  [98 %-100 %] 100 % (12/18 1707)  Weight change:  Filed Weights   11/11/24 0500 11/12/24 0500 11/13/24 0500  Weight: 62.8 kg 66.5 kg 65.3 kg    Intake/Output: I/O last 3 completed shifts: In: 852 [P.O.:480; Blood:372] Out: 2850 [Urine:1500; Stool:1350]   Intake/Output this shift:  Total I/O In: 890 [P.O.:480; Blood:410] Out: -   Physical Exam: General: NAD  Head: Normocephalic, atraumatic. Moist oral mucosa  Eyes: Anicteric  Lungs:  Rhonchi/wheeze, normal effort  Heart: Regular rate and rhythm  Abdomen:  Soft, nontender. Ileostomy   Extremities: 2+ peripheral edema.  Neurologic: Awake, alert, conversant  Skin: Warm,dry, no rash  Access: None    Basic Metabolic Panel: Recent Labs  Lab  11/10/24 0346 11/11/24 0430 11/12/24 0412 11/13/24 0446 11/14/24 0524  NA 136 138 133* 129* 130*  K 2.8* 3.5 3.4* 4.6 5.3*  CL 93* 96* 93* 91* 94*  CO2 29 32 31 27 28   GLUCOSE 115* 65* 119* 136* 150*  BUN 50* 46* 41* 36* 35*  CREATININE 1.38* 1.37* 1.18* 1.22* 1.28*  CALCIUM  8.9 9.2 8.7* 9.5 8.8*  MG  --  1.3* 1.8  --  1.7  PHOS  --   --  3.3  --   --     Liver Function Tests: Recent Labs  Lab 11/12/24 0412  ALBUMIN  3.2*    No results for input(s): LIPASE, AMYLASE in the last 168 hours.  No results for input(s): AMMONIA in the last 168 hours.  CBC: Recent Labs  Lab 11/08/24 0424 11/09/24 0402 11/10/24 0346 11/11/24 1643 11/11/24 2230 11/12/24 0810 11/12/24 1221 11/13/24 0446 11/14/24 0524  WBC 2.6* 4.0 4.2  --   --  3.4*  --   --   --   HGB 9.3* 9.9* 9.6* 9.1* 7.9* 7.9* 6.9* 7.6* 6.6*  HCT 28.3* 30.0* 29.1* 28.0* 24.9* 23.7* 20.4* 23.3*  --   MCV 96.3 96.5 95.7  --   --  97.5  --   --   --   PLT 71* 83* 95*  --   --  80*  --   --   --     Cardiac Enzymes: No results for input(s): CKTOTAL, CKMB, CKMBINDEX, TROPONINI in the last 168 hours.  BNP: Invalid input(s): POCBNP  CBG: Recent Labs  Lab 11/13/24 2047 11/14/24 0038 11/14/24 0731 11/14/24 1158 11/14/24 1707  GLUCAP 166* 156* 153* 240* 176*  Microbiology: Results for orders placed or performed during the hospital encounter of 10/27/24  Resp panel by RT-PCR (RSV, Flu A&B, Covid) Anterior Nasal Swab     Status: None   Collection Time: 10/27/24 11:01 AM   Specimen: Anterior Nasal Swab  Result Value Ref Range Status   SARS Coronavirus 2 by RT PCR NEGATIVE NEGATIVE Final    Comment: (NOTE) SARS-CoV-2 target nucleic acids are NOT DETECTED.  The SARS-CoV-2 RNA is generally detectable in upper respiratory specimens during the acute phase of infection. The lowest concentration of SARS-CoV-2 viral copies this assay can detect is 138 copies/mL. A negative result does not preclude  SARS-Cov-2 infection and should not be used as the sole basis for treatment or other patient management decisions. A negative result may occur with  improper specimen collection/handling, submission of specimen other than nasopharyngeal swab, presence of viral mutation(s) within the areas targeted by this assay, and inadequate number of viral copies(<138 copies/mL). A negative result must be combined with clinical observations, patient history, and epidemiological information. The expected result is Negative.  Fact Sheet for Patients:  bloggercourse.com  Fact Sheet for Healthcare Providers:  seriousbroker.it  This test is no t yet approved or cleared by the [place]  FDA and  has been authorized for detection and/or diagnosis of SARS-CoV-2 by FDA under an Emergency Use Authorization (EUA). This EUA will remain  in effect (meaning this test can be used) for the duration of the COVID-19 declaration under Section 564(b)(1) of the Act, 21 U.S.C.section 360bbb-3(b)(1), unless the authorization is terminated  or revoked sooner.       Influenza A by PCR NEGATIVE NEGATIVE Final   Influenza B by PCR NEGATIVE NEGATIVE Final    Comment: (NOTE) The Xpert Xpress SARS-CoV-2/FLU/RSV plus assay is intended as an aid in the diagnosis of influenza from Nasopharyngeal swab specimens and should not be used as a sole basis for treatment. Nasal washings and aspirates are unacceptable for Xpert Xpress SARS-CoV-2/FLU/RSV testing.  Fact Sheet for Patients: bloggercourse.com  Fact Sheet for Healthcare Providers: seriousbroker.it  This test is not yet approved or cleared by the [place]  FDA and has been authorized for detection and/or diagnosis of SARS-CoV-2 by FDA under an Emergency Use Authorization (EUA). This EUA will remain in effect (meaning this test can be used) for the duration of  the COVID-19 declaration under Section 564(b)(1) of the Act, 21 U.S.C. section 360bbb-3(b)(1), unless the authorization is terminated or revoked.     Resp Syncytial Virus by PCR NEGATIVE NEGATIVE Final    Comment: (NOTE) Fact Sheet for Patients: bloggercourse.com  Fact Sheet for Healthcare Providers: seriousbroker.it  This test is not yet approved or cleared by the [place]  FDA and has been authorized for detection and/or diagnosis of SARS-CoV-2 by FDA under an Emergency Use Authorization (EUA). This EUA will remain in effect (meaning this test can be used) for the duration of the COVID-19 declaration under Section 564(b)(1) of the Act, 21 U.S.C. section 360bbb-3(b)(1), unless the authorization is terminated or revoked.  Performed at [facility], [address] [place], [place] [address]   Blood Culture (routine x 2)     Status: None   Collection Time: 10/27/24 12:18 PM   Specimen: Right Antecubital; Blood  Result Value Ref Range Status   Specimen Description RIGHT ANTECUBITAL  Final   Special Requests   Final    BOTTLES DRAWN AEROBIC AND ANAEROBIC Blood Culture adequate volume   Culture   Final    NO  GROWTH 5 DAYS Performed at [facility], [address] [place], [place] [address]    Report Status 11/01/2024 FINAL  Final  Blood Culture (routine x 2)     Status: None   Collection Time: 10/27/24 12:18 PM   Specimen: Left Antecubital; Blood  Result Value Ref Range Status   Specimen Description LEFT ANTECUBITAL  Final   Special Requests   Final    BOTTLES DRAWN AEROBIC AND ANAEROBIC Blood Culture results may not be optimal due to an inadequate volume of blood received in culture bottles   Culture   Final    NO GROWTH 5 DAYS Performed at [facility], [address], [place], [place] [address]    Report Status 11/01/2024 FINAL  Final  MRSA Next Gen by PCR, Nasal     Status: Abnormal    Collection Time: 10/27/24  1:38 PM   Specimen: Nasal Mucosa; Nasal Swab  Result Value Ref Range Status   MRSA by PCR Next Gen DETECTED (A) NOT DETECTED Final    Comment: RESULT CALLED TO, READ BACK BY AND VERIFIED WITH: KATHERINE CLAYTON @1520  10/27/24 MJU (NOTE) The GeneXpert MRSA Assay (FDA approved for NASAL specimens only), is one component of a comprehensive MRSA colonization surveillance program. It is not intended to diagnose MRSA infection nor to guide or monitor treatment for MRSA infections. Test performance is not FDA approved in patients less than 74 years old. Performed at [facility], [address] [place], [place] [address]     Coagulation Studies: No results for input(s): LABPROT, INR in the last 72 hours.   Urinalysis: No results for input(s): COLORURINE, LABSPEC, PHURINE, GLUCOSEU, HGBUR, BILIRUBINUR, KETONESUR, PROTEINUR, UROBILINOGEN, NITRITE, LEUKOCYTESUR in the last 72 hours.  Invalid input(s): APPERANCEUR     Imaging: No results found.     Medications:      sodium chloride    Intravenous Once   allopurinol   300 mg Oral Daily   alteplase   2 mg Intracatheter Once   Chlorhexidine  Gluconate Cloth  6 each Topical Nightly   feeding supplement (GLUCERNA SHAKE)  237 mL Oral TID BM   furosemide   40 mg Oral Daily   insulin  aspart  0-15 Units Subcutaneous TID WC   insulin  aspart  0-5 Units Subcutaneous QHS   insulin  aspart  5 Units Subcutaneous TID WC   insulin  glargine  15 Units Subcutaneous QHS   latanoprost   1 drop Both Eyes QHS   levothyroxine   50 mcg Oral QAC breakfast   midodrine   15 mg Oral TID WC   multivitamin with minerals  1 tablet Oral Daily   pantoprazole   40 mg Oral BID   phytonadione   2.5 mg Oral Daily   rosuvastatin   5 mg Oral Daily   sodium chloride  flush  3 mL Intravenous Q12H   spironolactone   25 mg Oral BID   acetaminophen , alum & mag hydroxide-simeth, docusate sodium ,  ipratropium-albuterol , magnesium  hydroxide, ondansetron  (ZOFRAN ) IV, mouth rinse, polyethylene glycol, sodium chloride  flush, traZODone   Assessment/ Plan:  Tammy Arias is a 68 y.o.  female presents to ED with weakness and has been admitted for Hemorrhagic shock (HCC) [R57.8] Shock (HCC) [R57.9] Acute GI bleeding [K92.2] Acute renal failure, unspecified acute renal failure type [N17.9]   Acute Kidney Injury - Patient has baseline creatinine 0.92 on 08/22/24.  Acute kidney injury appears multifactorial at this time from prior hypotension and dehydration. Potassium 6.1 on admission. Corrected with shifting measures.  Required CRRT from 12/1 to 12/3.  Renal function relatively stable with a creatinine of 1.28.  Continue to monitor renal parameters closely.  Lab Results  Component Value Date   CREATININE 1.28 (H) 11/14/2024   CREATININE 1.22 (H) 11/13/2024   CREATININE 1.18 (H) 11/12/2024    Intake/Output Summary (Last 24 hours) at 11/14/2024 1814 Last data filed at 11/14/2024 1419 Gross per 24 hour  Intake 1010 ml  Output 2550 ml  Net -1540 ml    2. Hypokalemia Likely secondary to aggressive IV diuresis. Appears to be resolved.  Potassium actually high now at 5.3.  Stop spironolactone .  3. LE edema Lasix  reduced to 40 mg daily and we have discontinued spironolactone .   4. Anemia with suspected acute blood loss Lab Results  Component Value Date   HGB 6.6 (L) 11/14/2024  Has received blood transfusions during this admission. GI has no plans to repeat EGD due to current and recent workups.  Hemoglobin down to 6.6.  Received blood transfusion.     LOS: 18 Wilbert Schouten 12/18/20256:14 PM

## 2024-11-14 NOTE — Progress Notes (Signed)
 [facility] CARDIOLOGY PROGRESS NOTE   Patient ID: Tammy Arias MRN: [id number] DOB/AGE: 69-Jul-1957 69 y.o.  Admit date: 10/27/2024 Referring Physician Dr. Juanita Picking Primary Physician Valora Lynwood FALCON, MD  Primary Cardiologist Dr. Florencio Reason for Consultation atrial fibrillation, GI bleed  HPI: Tammy Arias is a 68 y.o. female with a past medical history of coronary artery disease s/p CABG, ischemic cardiomyopathy, chronic HFrEF, history of atrial fibrillation s/p failed Watchman, pulmonary hypertension, hx GI bleed who presented to the ED on 10/27/2024 for bloody drainage from ileostomy. Treated for hemorrhagic shock with improvement in this but with persistent hypotension and fluid retention. Cardiology was consulted for further evaluation.   Interval History:  -Patient seen and examined this AM, sitting upright in hospital bed. -BP remains stable, no complaints of dizziness. Cr relatively stable, without much improvement of LE edema. Endorses good UOP. -No significant events noted on tele. HR controlled, did have short episode of higher rate this AM but now improved. -Hgb down again this AM, had bleeding overnight.  Review of systems complete and found to be negative unless listed above   Vitals:   11/13/24 2046 11/14/24 0008 11/14/24 0445 11/14/24 0729  BP: (!) 85/64 (!) 94/34 (!) 87/35 (!) 85/46  Pulse: 75 80 84 82  Resp: 20   16  Temp: 98.5 F (36.9 C) 98.3 F (36.8 C) 97.7 F (36.5 C) 98.1 F (36.7 C)  TempSrc:    Oral  SpO2: 98% 98% 100% 100%  Weight:      Height:         Intake/Output Summary (Last 24 hours) at 11/14/2024 9160 Last data filed at 11/14/2024 0300 Gross per 24 hour  Intake 852 ml  Output 2550 ml  Net -1698 ml     PHYSICAL EXAM General: Chronically ill appearing female, well nourished, in no acute distress. HEENT: Normocephalic and atraumatic. Neck: No JVD.  Lungs: Normal respiratory effort on room air. Clear bilaterally to  auscultation. No wheezes, crackles, rhonchi.  Heart: irregularly irregular, controlled rate. Normal S1 and S2 without gallops or murmurs. Radial & DP pulses 2+ bilaterally. Abdomen: Non-distended appearing.  Msk: Normal strength and tone for age. Extremities: No clubbing, cyanosis.  Pitting edema bilaterally. Neuro: Alert and oriented X 3. Psych: Mood appropriate, affect congruent.    LABS: Basic Metabolic Panel: Recent Labs    11/12/24 0412 11/13/24 0446 11/14/24 0524  NA 133* 129* 130*  K 3.4* 4.6 5.3*  CL 93* 91* 94*  CO2 31 27 28   GLUCOSE 119* 136* 150*  BUN 41* 36* 35*  CREATININE 1.18* 1.22* 1.28*  CALCIUM  8.7* 9.5 8.8*  MG 1.8  --  1.7  PHOS 3.3  --   --    Liver Function Tests: Recent Labs    11/12/24 0412  ALBUMIN  3.2*    No results for input(s): LIPASE, AMYLASE in the last 72 hours. CBC: Recent Labs    11/12/24 0810 11/12/24 1221 11/13/24 0446 11/14/24 0524  WBC 3.4*  --   --   --   HGB 7.9* 6.9* 7.6* 6.6*  HCT 23.7* 20.4* 23.3*  --   MCV 97.5  --   --   --   PLT 80*  --   --   --    Cardiac Enzymes: No results for input(s): CKTOTAL, CKMB, CKMBINDEX, TROPONINIHS in the last 72 hours. BNP: No results for input(s): BNP in the last 72 hours. D-Dimer: No results for input(s): DDIMER in the last 72 hours. Hemoglobin  A1C: No results for input(s): HGBA1C in the last 72 hours. Fasting Lipid Panel: No results for input(s): CHOL, HDL, LDLCALC, TRIG, CHOLHDL, LDLDIRECT in the last 72 hours. Thyroid  Function Tests: No results for input(s): TSH, T4TOTAL, T3FREE, THYROIDAB in the last 72 hours.  Invalid input(s): FREET3  Anemia Panel: No results for input(s): VITAMINB12, FOLATE, FERRITIN, TIBC, IRON , RETICCTPCT in the last 72 hours.  No results found.   ECHO 07/2024: 1. Left ventricular ejection fraction, by estimation, is 35 to 40%. The left ventricle has moderately decreased function. The left  ventricle demonstrates global hypokinesis. Left ventricular diastolic parameters are indeterminate.   2. Mildly D-shaped interventricular septum suggestive of RV pressure/volume overload. Peak RV-RA gradient 36 mmHg. Right ventricular systolic function is moderately reduced. The right ventricular size is severely enlarged.   3. Left atrial size was severely dilated.   4. Right atrial size was severely dilated.   5. S/p mitral valve repair via mTEER with 1 Mitraclip, position appears  A2/P2. Mean gradient 5 mmHg. Moderate residual mitral regurgitation.   6. The tricuspid valve is abnormal. Tricuspid valve regurgitation is severe.   7. The aortic valve is tricuspid. There is mild calcification of the aortic valve. Aortic valve regurgitation is not visualized. No aortic stenosis is present.   8. The IVC was not visualized.   9. The patient was in atrial fibrillation.   TELEMETRY (personally reviewed): atrial fibrillation rate 90s  EKG (personally reviewed): Atrial fibrillation low voltage nonspecific ST-T wave changes 78 bpm  DATA reviewed by me 11/14/2024: last 24h vitals tele labs imaging I/O, hospitalist progress note, nephrology notes  Principal Problem:   Hemorrhagic shock (HCC) Active Problems:   Atrial fibrillation, chronic (HCC)   Type 2 diabetes mellitus (HCC)   OSA on CPAP   CAD with hx of CABG   Cirrhosis and Portal hypertension  on CT(HCC)   Acute on chronic HFrEF (heart failure with reduced ejection fraction) (HCC)   Hypothyroidism   Acute renal failure   Ileostomy in place [facility])   Esophageal varices without bleeding (HCC)   Protein calorie malnutrition   Shock (HCC)   Acute GI bleeding   Chronic kidney disease, stage 3b (HCC)   Leg edema   Malnutrition of moderate degree    ASSESSMENT AND PLAN: Tammy Arias is a 68 y.o. female with a past medical history of coronary artery disease s/p CABG, ischemic cardiomyopathy, chronic HFrEF, history of atrial fibrillation s/p  failed Watchman, pulmonary hypertension, hx GI bleed who presented to the ED on 10/27/2024 for bloody drainage from ileostomy. Treated for hemorrhagic shock with improvement in this but with persistent hypotension and fluid retention. Cardiology was consulted for further evaluation.   # Acute on chronic HFrEF # GI bleed, hemorrhagic shock # Coronary artery disease # Chronic atrial fibrillation -Diuresis as per nephrology. Appreciate their recommendations.  -BP unable to tolerate further GDMT. Continue spironolactone  25 mg twice daily, carvedilol  held. Can consider SGLT2i.  -Midodrine  per primary team. Weaned off pressors.  -Recommend Hgb goal >8 given underlying cardiac issues. -Given comorbidities, inability to tolerate anticoagulation, patient would likely not be a candidate for advanced therapies.   This patient's case was discussed and created with Dr. Ammon and he is in agreement.  Signed:  Danita Bloch, PA-C  11/14/2024, 8:39 AM [facility]

## 2024-11-14 NOTE — TOC Progression Note (Signed)
 Transition of Care [facility]) - Progression Note    Patient Details  Name: Tammy Arias MRN: [id number] Date of Birth: 1956/05/17  Transition of Care [facility]) CM/SW Contact  Dalia GORMAN Fuse, RN Phone Number: 11/14/2024, 9:26 AM  Clinical Narrative:     Nephrology and Cardiology following, continuing to diurese and make adjustments to medications. Patient likely not a candidate for advanced anticoagulation. HGB 7.6 yesterday, trending up.  Plan to DC to home when medically stable.    Barriers to Discharge: Continued Medical Work up               Expected Discharge Plan and Services       Living arrangements for the past 2 months: Single Family Home                                       Social Drivers of Health (SDOH) Interventions SDOH Screenings   Food Insecurity: No Food Insecurity (10/29/2024)  Housing: Low Risk (10/29/2024)  Transportation Needs: No Transportation Needs (10/29/2024)  Utilities: Not At Risk (10/29/2024)  Depression (PHQ2-9): Low Risk (09/04/2024)  Financial Resource Strain: Medium Risk (05/20/2024)   Received from [facility]  Social Connections: Moderately Integrated (10/29/2024)  Tobacco Use: Medium Risk (10/27/2024)    Readmission Risk Interventions    10/28/2024    3:01 PM 08/16/2024    1:36 PM 06/11/2024    4:23 PM  Readmission Risk Prevention Plan  Transportation Screening Complete Complete Complete  Medication Review Oceanographer) Complete Complete Complete  PCP or Specialist appointment within 3-5 days of discharge Complete Complete   HRI or Home Care Consult   --  SW Recovery Care/Counseling Consult Complete Complete   Palliative Care Screening Not Applicable Not Applicable Not Applicable  Skilled Nursing Facility Not Applicable Not Applicable Not Applicable

## 2024-11-14 NOTE — Plan of Care (Signed)
°                                                   °  Palliative Care Progress Note   Patient Name: Tammy Arias       Date: 11/14/2024 DOB: 02/19/1956  Age: 68 y.o. MRN#: [id number] Attending Physician: Tammy [place], MD Primary Care Physician: Tammy Lynwood FALCON, MD Admit Date: 10/27/2024  Extensive chart review completed including labs, vital signs, imaging, progress notes, orders, and available advanced directive documents from current and previous encounters.   Surgery has been consulted given patient's frank red blood in ileostomy.  Plan is for nuclear medicine bleeding scan to try and localize area of bleeding if possible.  Additionally, CTA may be considered if kidney function allows.  From a palliative perspective, goals are clear.  DNR with limited interventions remains.  Patient continues to want to go home when medically stable.  No acute palliative needs at this time.  PMT will continue to follow and support, monitor peripherally, and reengage when appropriate.  Please utilize [facility] for on-call provider should acute palliative needs arise during this hospitalization.  Thank you for allowing the Palliative Medicine Team to assist in the care of Tammy Arias.  Tammy L. Arvid, DNP, FNP-BC Palliative Medicine Team    No charge

## 2024-11-15 DIAGNOSIS — R578 Other shock: Secondary | ICD-10-CM | POA: Diagnosis not present

## 2024-11-15 LAB — BASIC METABOLIC PANEL WITH GFR
Anion gap: 10 (ref 5–15)
BUN: 32 mg/dL — ABNORMAL HIGH (ref 8–23)
CO2: 25 mmol/L (ref 22–32)
Calcium: 8.9 mg/dL (ref 8.9–10.3)
Chloride: 93 mmol/L — ABNORMAL LOW (ref 98–111)
Creatinine, Ser: 1.24 mg/dL — ABNORMAL HIGH (ref 0.44–1.00)
GFR, Estimated: 47 mL/min — ABNORMAL LOW
Glucose, Bld: 198 mg/dL — ABNORMAL HIGH (ref 70–99)
Potassium: 5.2 mmol/L — ABNORMAL HIGH (ref 3.5–5.1)
Sodium: 128 mmol/L — ABNORMAL LOW (ref 135–145)

## 2024-11-15 LAB — GLUCOSE, CAPILLARY
Glucose-Capillary: 219 mg/dL — ABNORMAL HIGH (ref 70–99)
Glucose-Capillary: 220 mg/dL — ABNORMAL HIGH (ref 70–99)
Glucose-Capillary: 177 mg/dL — ABNORMAL HIGH (ref 70–99)
Glucose-Capillary: 218 mg/dL — ABNORMAL HIGH (ref 70–99)

## 2024-11-15 LAB — HEMOGLOBIN: Hemoglobin: 7.5 g/dL — ABNORMAL LOW (ref 12.0–15.0)

## 2024-11-15 MED ORDER — METHOCARBAMOL 500 MG PO TABS
500.0000 mg | ORAL_TABLET | Freq: Three times a day (TID) | ORAL | Status: AC
  Administered 2024-11-15 – 2024-11-17 (×6): 500 mg via ORAL
  Filled 2024-11-15 (×6): qty 1

## 2024-11-15 MED ORDER — SODIUM ZIRCONIUM CYCLOSILICATE 10 G PO PACK
10.0000 g | PACK | Freq: Once | ORAL | Status: AC
  Administered 2024-11-15: 10 g via ORAL
  Filled 2024-11-15: qty 1

## 2024-11-15 NOTE — Plan of Care (Signed)

## 2024-11-15 NOTE — Progress Notes (Signed)
 PT Cancellation Note  Patient Details Name: Tammy Arias MRN: [id number] DOB: August 18, 1956   Cancelled Treatment:    Reason Eval/Treat Not Completed: Other (comment).  Chart reviewed and attempted to see pt.  Upon arrival pt soundly sleeping and did not wake upon entry with door knock x2 attempts.  Therapist generated and left HEP for the pt to perform and placed it on the table.  Will re-attempt at later date/time as medically appropriate.   Fonda Simpers, PT, DPT Physical Therapist - [place]  [facility]  11/15/2024, 11:11 AM

## 2024-11-15 NOTE — Progress Notes (Signed)
 " [facility]  ROUNDING NOTE   Subjective:   Tammy Arias is a 68 y.o.female with medical problems of heart failure with reduced ejection fraction 25 to 30%, severe mitral regurgitation, moderate right ventricular systolic dysfunction, severe tricuspid regurgitation, atrial fibrillation, failed Watchman device, chronic anticoagulation with apixaban , GI bleed, coronary disease status post CABG in 2005, aortic stenosis repair, pulmonary hypertension, type 2 diabetes, hypertension, liver cirrhosis with grade 2 esophageal varices, C. 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 She reports to ED for generalized malaise and is being admitted for Hemorrhagic shock (HCC) [R57.8] Shock (HCC) [R57.9] Acute GI bleeding [K92.2] Acute renal failure, unspecified acute renal failure type [N17.9]  Update:  Hemoglobin improved to 7.5 posttransfusion. Renal function stable with creatinine 1.24. Still has lower extremity edema.   Objective:  Vital signs in last 24 hours:  Temp:  [98.2 F (36.8 C)-98.8 F (37.1 C)] 98.5 F (36.9 C) (12/19 1718) Pulse Rate:  [83-96] 84 (12/19 1718) Resp:  [16-18] 17 (12/19 1718) BP: (101-113)/(39-58) 106/51 (12/19 1718) SpO2:  [97 %-100 %] 100 % (12/19 1718) Weight:  [65.5 kg] 65.5 kg (12/19 0500)  Weight change:  Filed Weights   11/12/24 0500 11/13/24 0500 11/15/24 0500  Weight: 66.5 kg 65.3 kg 65.5 kg    Intake/Output: I/O last 3 completed shifts: In: 1370 [P.O.:960; Blood:410] Out: 5000 [Urine:2150; Stool:2850]   Intake/Output this shift:  Total I/O In: 240 [P.O.:240] Out: 1600 [Urine:1100; Stool:500]  Physical Exam: General: NAD  Head: Normocephalic, atraumatic. Moist oral mucosa  Eyes: Anicteric  Lungs:  Rhonchi/wheeze, normal effort  Heart: Regular rate and rhythm  Abdomen:  Soft, nontender. Ileostomy   Extremities: 2+ peripheral edema.  Neurologic: Awake, alert, conversant  Skin:  Warm,dry, no rash  Access: None    Basic Metabolic Panel: Recent Labs  Lab 11/11/24 0430 11/12/24 0412 11/13/24 0446 11/14/24 0524 11/15/24 0411  NA 138 133* 129* 130* 128*  K 3.5 3.4* 4.6 5.3* 5.2*  CL 96* 93* 91* 94* 93*  CO2 32 31 27 28 25   GLUCOSE 65* 119* 136* 150* 198*  BUN 46* 41* 36* 35* 32*  CREATININE 1.37* 1.18* 1.22* 1.28* 1.24*  CALCIUM  9.2 8.7* 9.5 8.8* 8.9  MG 1.3* 1.8  --  1.7  --   PHOS  --  3.3  --   --   --     Liver Function Tests: Recent Labs  Lab 11/12/24 0412  ALBUMIN  3.2*    No results for input(s): LIPASE, AMYLASE in the last 168 hours.  No results for input(s): AMMONIA in the last 168 hours.  CBC: Recent Labs  Lab 11/09/24 0402 11/10/24 0346 11/11/24 1643 11/11/24 2230 11/12/24 0810 11/12/24 1221 11/13/24 0446 11/14/24 0524 11/15/24 0411  WBC 4.0 4.2  --   --  3.4*  --   --   --   --   HGB 9.9* 9.6* 9.1* 7.9* 7.9* 6.9* 7.6* 6.6* 7.5*  HCT 30.0* 29.1* 28.0* 24.9* 23.7* 20.4* 23.3*  --   --   MCV 96.5 95.7  --   --  97.5  --   --   --   --   PLT 83* 95*  --   --  80*  --   --   --   --     Cardiac Enzymes: No results for input(s): CKTOTAL, CKMB, CKMBINDEX, TROPONINI in the last 168 hours.  BNP: Invalid input(s): POCBNP  CBG: Recent Labs  Lab 11/14/24 1707 11/14/24 2112 11/15/24 0849 11/15/24 1209 11/15/24 1712  GLUCAP 176* 164* 219* 220* 177*    Microbiology: Results for orders placed or performed during the hospital encounter of 10/27/24  Resp panel by RT-PCR (RSV, Flu A&B, Covid) Anterior Nasal Swab     Status: None   Collection Time: 10/27/24 11:01 AM   Specimen: Anterior Nasal Swab  Result Value Ref Range Status   SARS Coronavirus 2 by RT PCR NEGATIVE NEGATIVE Final    Comment: (NOTE) SARS-CoV-2 target nucleic acids are NOT DETECTED.  The SARS-CoV-2 RNA is generally detectable in upper respiratory specimens during the acute phase of infection. The lowest concentration of SARS-CoV-2 viral  copies this assay can detect is 138 copies/mL. A negative result does not preclude SARS-Cov-2 infection and should not be used as the sole basis for treatment or other patient management decisions. A negative result may occur with  improper specimen collection/handling, submission of specimen other than nasopharyngeal swab, presence of viral mutation(s) within the areas targeted by this assay, and inadequate number of viral copies(<138 copies/mL). A negative result must be combined with clinical observations, patient history, and epidemiological information. The expected result is Negative.  Fact Sheet for Patients:  bloggercourse.com  Fact Sheet for Healthcare Providers:  seriousbroker.it  This test is no t yet approved or cleared by the [place]  FDA and  has been authorized for detection and/or diagnosis of SARS-CoV-2 by FDA under an Emergency Use Authorization (EUA). This EUA will remain  in effect (meaning this test can be used) for the duration of the COVID-19 declaration under Section 564(b)(1) of the Act, 21 U.S.C.section 360bbb-3(b)(1), unless the authorization is terminated  or revoked sooner.       Influenza A by PCR NEGATIVE NEGATIVE Final   Influenza B by PCR NEGATIVE NEGATIVE Final    Comment: (NOTE) The Xpert Xpress SARS-CoV-2/FLU/RSV plus assay is intended as an aid in the diagnosis of influenza from Nasopharyngeal swab specimens and should not be used as a sole basis for treatment. Nasal washings and aspirates are unacceptable for Xpert Xpress SARS-CoV-2/FLU/RSV testing.  Fact Sheet for Patients: bloggercourse.com  Fact Sheet for Healthcare Providers: seriousbroker.it  This test is not yet approved or cleared by the [place]  FDA and has been authorized for detection and/or diagnosis of SARS-CoV-2 by FDA under an Emergency Use Authorization (EUA). This  EUA will remain in effect (meaning this test can be used) for the duration of the COVID-19 declaration under Section 564(b)(1) of the Act, 21 U.S.C. section 360bbb-3(b)(1), unless the authorization is terminated or revoked.     Resp Syncytial Virus by PCR NEGATIVE NEGATIVE Final    Comment: (NOTE) Fact Sheet for Patients: bloggercourse.com  Fact Sheet for Healthcare Providers: seriousbroker.it  This test is not yet approved or cleared by the [place]  FDA and has been authorized for detection and/or diagnosis of SARS-CoV-2 by FDA under an Emergency Use Authorization (EUA). This EUA will remain in effect (meaning this test can be used) for the duration of the COVID-19 declaration under Section 564(b)(1) of the Act, 21 U.S.C. section 360bbb-3(b)(1), unless the authorization is terminated or revoked.  Performed at [facility], [address] [place], [place] [address]   Blood Culture (routine x 2)     Status: None   Collection Time: 10/27/24 12:18 PM   Specimen: Right Antecubital; Blood  Result Value Ref Range Status   Specimen Description RIGHT ANTECUBITAL  Final   Special Requests   Final  BOTTLES DRAWN AEROBIC AND ANAEROBIC Blood Culture adequate volume   Culture   Final    NO GROWTH 5 DAYS Performed at [facility], [address] [place], [place] [address]    Report Status 11/01/2024 FINAL  Final  Blood Culture (routine x 2)     Status: None   Collection Time: 10/27/24 12:18 PM   Specimen: Left Antecubital; Blood  Result Value Ref Range Status   Specimen Description LEFT ANTECUBITAL  Final   Special Requests   Final    BOTTLES DRAWN AEROBIC AND ANAEROBIC Blood Culture results may not be optimal due to an inadequate volume of blood received in culture bottles   Culture   Final    NO GROWTH 5 DAYS Performed at [facility], [address], [place], [place] [address]    Report  Status 11/01/2024 FINAL  Final  MRSA Next Gen by PCR, Nasal     Status: Abnormal   Collection Time: 10/27/24  1:38 PM   Specimen: Nasal Mucosa; Nasal Swab  Result Value Ref Range Status   MRSA by PCR Next Gen DETECTED (A) NOT DETECTED Final    Comment: RESULT CALLED TO, READ BACK BY AND VERIFIED WITH: KATHERINE CLAYTON @1520  10/27/24 MJU (NOTE) The GeneXpert MRSA Assay (FDA approved for NASAL specimens only), is one component of a comprehensive MRSA colonization surveillance program. It is not intended to diagnose MRSA infection nor to guide or monitor treatment for MRSA infections. Test performance is not FDA approved in patients less than 46 years old. Performed at [facility], [address] [place], [place] [address]     Coagulation Studies: No results for input(s): LABPROT, INR in the last 72 hours.   Urinalysis: No results for input(s): COLORURINE, LABSPEC, PHURINE, GLUCOSEU, HGBUR, BILIRUBINUR, KETONESUR, PROTEINUR, UROBILINOGEN, NITRITE, LEUKOCYTESUR in the last 72 hours.  Invalid input(s): APPERANCEUR     Imaging: CT ANGIO GI BLEED Result Date: 11/14/2024 CLINICAL DATA:  GI bleed. EXAM: CTA ABDOMEN AND PELVIS WITHOUT AND WITH CONTRAST TECHNIQUE: Multidetector CT imaging of the abdomen and pelvis was performed using the standard protocol during bolus administration of intravenous contrast. Multiplanar reconstructed images and MIPs were obtained and reviewed to evaluate the vascular anatomy. RADIATION DOSE REDUCTION: This exam was performed according to the departmental dose-optimization program which includes automated exposure control, adjustment of the mA and/or kV according to patient size and/or use of iterative reconstruction technique. CONTRAST:  OMNIPAQUE  IOHEXOL  350 MG/ML SOLN COMPARISON:  CT abdomen pelvis dated 10/28/2024. FINDINGS: VASCULAR Aorta: Moderate atherosclerotic calcification of the aorta. No abnormal  dilatation or dissection. No periaortic fluid collection. Celiac: Patent without evidence of aneurysm, dissection, vasculitis or significant stenosis. SMA: Patent without evidence of aneurysm, dissection, vasculitis or significant stenosis. Renals: Atherosclerotic calcification of the origins of the renal arteries. There is overall decreased renal artery lumen with decreased flow. The renal arteries remain patent. IMA: Faint contrast noted in the proximal RCA. Inflow: Mild atherosclerotic calcification. No aneurysmal dilatation or dissection. The iliac arteries are patent. Proximal Outflow: The visualized proximal pleural patent. Veins: The IVC is unremarkable. The SMV, splenic vein, and main portal vein are patent. No portal venous gas. Review of the MIP images confirms the above findings. NON-VASCULAR Lower chest: The visualized lung bases are clear. There is cardiomegaly. No intra-abdominal free air.  Small ascites. Hepatobiliary: Cirrhosis. Gallstones. The gallbladder is contracted. Pancreas: There is stranding adjacent to the pancreas which may be related to ascites. Correlation with pancreatic enzymes recommended to  exclude acute pancreatitis. Spleen: Borderline splenomegaly measuring 14 cm in length. Adrenals/Urinary Tract: The adrenal glands unremarkable. Mild bilateral renal parenchyma atrophy. There is no hydronephrosis or nephrolithiasis on either side. Subcentimeter hyperdense renal lesions are too small to characterize. The visualized ureters and urinary bladder appear unremarkable. Stomach/Bowel: Postsurgical changes of colectomy with right lower ileostomy. Several thick appearing loop of small bowel in the lower abdomen may be related to diffuse mesenteric edema and ascites or represent enteritis. Clinical correlation is recommended. No bowel obstruction. No evidence of active GI bleed. Lymphatic: No adenopathy. Reproductive: The uterus is grossly unremarkable. Other: Midline vertical anterior pelvic  wall incisional scar. Diffuse mesenteric and subcutaneous edema and anasarca. Small ventral supraumbilical fat containing hernia. Musculoskeletal: Degenerative changes of the spine. No acute osseous pathology. IMPRESSION: 1. No evidence of active GI bleed. 2. Postsurgical changes of colectomy with right lower ileostomy. 3. Cirrhosis with small ascites and borderline splenomegaly. 4. Cholelithiasis. 5.  Aortic Atherosclerosis (ICD10-I70.0). Electronically Signed   By: Vanetta Chou M.D.   On: 11/14/2024 20:17       Medications:      sodium chloride    Intravenous Once   allopurinol   300 mg Oral Daily   alteplase   2 mg Intracatheter Once   Chlorhexidine  Gluconate Cloth  6 each Topical Nightly   feeding supplement (GLUCERNA SHAKE)  237 mL Oral TID BM   furosemide   40 mg Oral Daily   insulin  aspart  0-15 Units Subcutaneous TID WC   insulin  aspart  0-5 Units Subcutaneous QHS   insulin  aspart  5 Units Subcutaneous TID WC   insulin  glargine  15 Units Subcutaneous QHS   latanoprost   1 drop Both Eyes QHS   levothyroxine   50 mcg Oral QAC breakfast   methocarbamol   500 mg Oral TID   midodrine   15 mg Oral TID WC   multivitamin with minerals  1 tablet Oral Daily   pantoprazole   40 mg Oral BID   phytonadione   2.5 mg Oral Daily   rosuvastatin   5 mg Oral Daily   sodium chloride  flush  3 mL Intravenous Q12H   acetaminophen , alum & mag hydroxide-simeth, docusate sodium , ipratropium-albuterol , magnesium  hydroxide, ondansetron  (ZOFRAN ) IV, mouth rinse, polyethylene glycol, sodium chloride  flush, traZODone   Assessment/ Plan:  Tammy Arias is a 68 y.o.  female presents to ED with weakness and has been admitted for Hemorrhagic shock (HCC) [R57.8] Shock (HCC) [R57.9] Acute GI bleeding [K92.2] Acute renal failure, unspecified acute renal failure type [N17.9]   Acute Kidney Injury - Patient has baseline creatinine 0.92 on 08/22/24.  Acute kidney injury appears multifactorial at this time from  prior hypotension and dehydration. Potassium 6.1 on admission. Corrected with shifting measures.  Required CRRT from 12/1 to 12/3.  Renal function remained stable with a creatinine of 1.24.  Continue to monitor renal parameters periodically.  Lab Results  Component Value Date   CREATININE 1.24 (H) 11/15/2024   CREATININE 1.28 (H) 11/14/2024   CREATININE 1.22 (H) 11/13/2024    Intake/Output Summary (Last 24 hours) at 11/15/2024 1749 Last data filed at 11/15/2024 1717 Gross per 24 hour  Intake 720 ml  Output 4850 ml  Net -4130 ml    2. Hypokalemia Likely secondary to aggressive IV diuresis. Resolved and in fact potassium high at 5.2.  Spironolactone  was stopped.  3. LE edema Lasix  reduced to 40 mg daily and we have discontinued spironolactone .   4. Anemia with suspected acute blood loss Lab Results  Component Value Date  HGB 7.5 (L) 11/15/2024  Has received blood transfusions during this admission. GI has no plans to repeat EGD due to current and recent workups.  Hemoglobin improved to 7.5 posttransfusion.     LOS: 19 Aydrien Froman 12/19/20255:49 PM   "

## 2024-11-15 NOTE — Progress Notes (Signed)
 [facility] SURGICAL PROGRESS NOTE (cpt [phone])  Hospital Day(s): 19.   Interval History: Patient seen and examined, no acute events or new complaints overnight. Patient reports she has had no further bleeding. Hemoglobin responded to transfusion 12/18; now 7.5. BMP with hyperkalemia to 5.2, renal function baseline. Ileostomy with 1700 ccs out; non-bloody. CTA without evidence of bleed. We did apply silver  nitrate to raw area of ileostomy on 12/18.  Review of Systems:  Constitutional: denies fever, chills  HEENT: denies cough or congestion  Respiratory: denies any shortness of breath  Cardiovascular: denies chest pain or palpitations  Gastrointestinal: denies abdominal pain, N/V Genitourinary: denies burning with urination or urinary frequency Musculoskeletal: denies pain, decreased motor or sensation  Vital signs in last 24 hours: [min-max] current  Temp:  [97.9 F (36.6 C)-98.8 F (37.1 C)] 98.4 F (36.9 C) (12/19 0428) Pulse Rate:  [81-96] 83 (12/19 0428) Resp:  [16-18] 18 (12/19 0428) BP: (85-114)/(34-58) 101/39 (12/19 0428) SpO2:  [97 %-100 %] 99 % (12/19 0428) Weight:  [65.5 kg] 65.5 kg (12/19 0500)     Height: 5' 1 (154.9 cm) Weight: 65.5 kg BMI (Calculated): 27.3   Intake/Output last 2 shifts:  12/18 0701 - 12/19 0700 In: 1370 [P.O.:960; Blood:410] Out: 3250 [Urine:1550; Stool:1700]   Physical Exam:  Constitutional: alert, cooperative and no distress  HENT: normocephalic without obvious abnormality  Eyes: PERRL, EOM's grossly intact and symmetric  Respiratory: breathing non-labored at rest  Cardiovascular: regular rate and sinus rhythm  Gastrointestinal: Soft, non-tender, non-distended, no rebound/guarding. Ileostomy to right abdomen, noted changes from silver  nitrate, no further bleeding nor melanotic stool this AM   Labs:     Latest Ref Rng & Units 11/15/2024    4:11 AM 11/14/2024    5:24 AM 11/13/2024    4:46 AM  CBC  Hemoglobin 12.0 - 15.0  g/dL 7.5  6.6  7.6   Hematocrit 36.0 - 46.0 %   23.3       Latest Ref Rng & Units 11/15/2024    4:11 AM 11/14/2024    5:24 AM 11/13/2024    4:46 AM  CMP  Glucose 70 - 99 mg/dL 801  849  863   BUN 8 - 23 mg/dL 32  35  36   Creatinine 0.44 - 1.00 mg/dL 8.75  8.71  8.77   Sodium 135 - 145 mmol/L 128  130  129   Potassium 3.5 - 5.1 mmol/L 5.2  5.3  4.6   Chloride 98 - 111 mmol/L 93  94  91   CO2 22 - 32 mmol/L 25  28  27    Calcium  8.9 - 10.3 mg/dL 8.9  8.8  9.5      Imaging studies: No new pertinent imaging studies   Assessment/Plan:  68 y.o. female with GI bleeding, unclear source although potentially from raw ara on ileostomy itself, history of end ileostomy, complicated by pertinent comorbidities including significant cardiac disease.    -  Fortunately, she has been without further bleeding overnight and CTA was negative. This may have potentially been coming fro a raw area on the 7-8 o'clock position of her ileostomy. We were able to apply silver  nitrate to this area. Continue to monitor for further bleeding. We would certainly need a definitive target prior to consideration of surgical interventions.   - Monitor H&H; responded to transfusion               - Monitor ileostomy output - Monitor abdominal examination              -  Further management per primary service  All of the above findings and recommendations were discussed with the patient, and the medical team, and all of patient's questions were answered to her expressed satisfaction.  -- Arthea Platt, PA-C [facility] 11/15/2024, 7:19 AM M-F: 7am - 4pm

## 2024-11-15 NOTE — Progress Notes (Signed)
 " PROGRESS NOTE    Tammy Arias  FMW:[id number] DOB: Dec 11, 1955 DOA: 10/27/2024 PCP: Valora Lynwood FALCON, MD  [id number]/101A-AA  LOS: 19 days   Brief hospital course:   Assessment & Plan: 68 y.o.female with medical problems of heart failure with reduced ejection fraction 25 to 30%, severe mitral regurgitation, moderate right ventricular systolic dysfunction, severe tricuspid regurgitation, atrial fibrillation, failed Watchman device, chronic anticoagulation with apixaban , GI bleed, coronary disease status post CABG in 2005, aortic stenosis repair, pulmonary hypertension, type 2 diabetes, hypertension, liver cirrhosis with grade 2 esophageal varices, C. 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     She reports to ED for generalized malaise and is being admitted for hypotension +GIB from ileostomy.  Patient initially admitted for hemorrhagic stroke got multiple IV fluid boluses, IV pressors with electrolyte derangements she required Levophed  and was in ICU.  There is a component of cardiogenic shock with extensive cardiac history.  Cardiology was consulted, weaned off pressors, doing good on IV Lasix  therapy with close monitoring of renal function by nephrology team. 12/16. She has bleeding from colostomy which is ongoing issue, no intervention per GI. Repeat Hb 6.9, 1 unit PRBC ordered.   Acute on chronic HFrEF: Cardiogenic shock-resolved EF 35 to 40%, moderate MR history of mitral valve repair, severe TR. Status post volume overloaded after fluid resuscitation. --received IV Lasix  40 mg twice daily per nephrology. --transition to oral lasix  40 mg daily on 12/17, d/c'ed aldactone  and coreg . --cont oral lasix  40 mg daily  Hypotension --cont midodrine    AKI on CKD stage III: Nephrology following. Creatinine improved to 1.18. --monitor Cr   Possible adrenal insufficiency: --s/p stress dose steroids    GI bleeding: History of AVMs Esophageal  varices History of recurrent GI bleeds- Does have on and off blood in the ostomy, substantial amount.  Pt has had extensive GI workup in the past, GI has signed off. --7u pRBC so far --GenSurg consulted --CT angio bleeding scan again neg --monitor for bleeding and Hgb   Hyponatremia  Hyperkalemia --lokelma  x1  Paroxysmal A-fib: status post Watchman procedure.   Not on anticoagulation secondary to GI bleed.   Thrombocytopenia-stable.   Cirrhosis-stable  Vitamin K deficiency: --cont oral vit K daily   Ileostomy status- History of C. 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   Type 2 diabetes mellitus --cont Lantus  15u nightly --mealtime 5u TID --ACHS and SSI   OSA -not compliant with CPAP.   Debility: Refused SNF, wishes to go home with home health.    DVT prophylaxis: SCD/Compression stockings Code Status: DNR  Family Communication:  Level of care: Med-Surg Dispo:   The patient is from: home Anticipated d/c is to: home Anticipated d/c date is: 2-3 days   Subjective and Interval History:  CTA bleeding scan yesterday again neg.  No bleeding today.  Pt complained of hand cramps.   Objective: Vitals:   11/15/24 0727 11/15/24 1215 11/15/24 1718 11/15/24 2032  BP: (!) 112/53 (!) 105/49 (!) 106/51 (!) 101/50  Pulse: 85 91 84 79  Resp: 16 18 17 15   Temp: 98.2 F (36.8 C) 98.6 F (37 C) 98.5 F (36.9 C) 98.3 F (36.8 C)  TempSrc:  Oral Oral   SpO2: 100% 100% 100% 100%  Weight:      Height:        Intake/Output Summary (Last 24 hours) at 11/15/2024 2138  Last data filed at 11/15/2024 1919 Gross per 24 hour  Intake 760 ml  Output 2850 ml  Net -2090 ml   Filed Weights   11/12/24 0500 11/13/24 0500 11/15/24 0500  Weight: 66.5 kg 65.3 kg 65.5 kg    Examination:   Constitutional: NAD, AAOx3 HEENT: conjunctivae and lids normal, EOMI CV: No  cyanosis.   RESP: normal respiratory effort Neuro: II - XII grossly intact.   Psych: Normal mood and affect.  Appropriate judgement and reason   Data Reviewed: I have personally reviewed labs and imaging studies  Time spent: 35 minutes  Ellouise Haber, MD [facility] If 7PM-7AM, please contact night-coverage 11/15/2024, 9:38 PM   "

## 2024-11-15 NOTE — Consult Note (Signed)
 PHARMACY CONSULT NOTE - ELECTROLYTES  Pharmacy Consult for Electrolyte Monitoring and Replacement   Recent Labs: Potassium (mmol/L)  Date Value  11/15/2024 5.2 (H)   Magnesium  (mg/dL)  Date Value  87/81/7974 1.7   Calcium  (mg/dL)  Date Value  87/80/7974 8.9   Albumin  (g/dL)  Date Value  87/83/7974 3.2 (L)  05/18/2020 4.7   Phosphorus (mg/dL)  Date Value  87/83/7974 3.3   Sodium (mmol/L)  Date Value  11/15/2024 128 (L)   Height: 5' 1 (154.9 cm) Weight: 65.5 kg (144 lb 6.4 oz) IBW/kg (Calculated) : 47.8 Estimated Creatinine Clearance: 37.1 mL/min (A) (by C-G formula based on SCr of 1.24 mg/dL (H)).  Assessment  Tammy Arias is a 68 y.o. female presenting with acute HFrEF. PMH significant for CHF, Afib, GI bleeding, CABG, pHTN, liver cirrhosis, C diff s/p colectomy and end ileostomy. Pharmacy has been consulted to monitor and replace electrolytes.  Diet: Carb modified MIVF: N/A Pertinent medications: Lasix  40 mg po Spironolactone  25mg  discontinued due to hyperK, hypoNa  Goal of Therapy: Electrolytes within normal limits  Plan:  K 5.2 >> will order Lokelma  10g PO x 1 Electrolytes with AM labs  Thank you for allowing pharmacy to be a part of this patients care.  Will M. Lenon, PharmD, BCPS Clinical Pharmacist 11/15/2024 10:28 AM

## 2024-11-15 NOTE — TOC Progression Note (Signed)
 Transition of Care [facility]) - Progression Note    Patient Details  Name: Tammy Arias MRN: [id number] Date of Birth: 10-Jan-1956  Transition of Care [facility]) CM/SW Contact  Dalia GORMAN Fuse, RN Phone Number: 11/15/2024, 10:55 AM  Clinical Narrative:     Creatinine 1.18. EF 35 to 40%, moderate MR. Continuing to diurese. No further bleeding overnight and CTA was negative. Plan for home with [facility] when the patient is medically clear to discharge.     Barriers to Discharge: Continued Medical Work up               Expected Discharge Plan and Services       Living arrangements for the past 2 months: Single Family Home                                       Social Drivers of Health (SDOH) Interventions SDOH Screenings   Food Insecurity: No Food Insecurity (10/29/2024)  Housing: Low Risk (10/29/2024)  Transportation Needs: No Transportation Needs (10/29/2024)  Utilities: Not At Risk (10/29/2024)  Depression (PHQ2-9): Low Risk (09/04/2024)  Financial Resource Strain: Medium Risk (05/20/2024)   Received from [facility]  Social Connections: Moderately Integrated (10/29/2024)  Tobacco Use: Medium Risk (10/27/2024)    Readmission Risk Interventions    10/28/2024    3:01 PM 08/16/2024    1:36 PM 06/11/2024    4:23 PM  Readmission Risk Prevention Plan  Transportation Screening Complete Complete Complete  Medication Review Oceanographer) Complete Complete Complete  PCP or Specialist appointment within 3-5 days of discharge Complete Complete   HRI or Home Care Consult   --  SW Recovery Care/Counseling Consult Complete Complete   Palliative Care Screening Not Applicable Not Applicable Not Applicable  Skilled Nursing Facility Not Applicable Not Applicable Not Applicable

## 2024-11-15 NOTE — Progress Notes (Signed)
 [facility] CLINIC CARDIOLOGY PROGRESS NOTE   Patient ID: Tammy Arias MRN: [id number] DOB/AGE: May 11, 1956 68 y.o.  Admit date: 10/27/2024 Referring Physician Dr. Juanita Picking Primary Physician Valora Lynwood FALCON, MD  Primary Cardiologist Dr. Florencio Reason for Consultation atrial fibrillation, GI bleed  HPI: Tammy Arias is a 67 y.o. female with a past medical history of coronary artery disease s/p CABG, ischemic cardiomyopathy, chronic HFrEF, history of atrial fibrillation s/p failed Watchman, pulmonary hypertension, hx GI bleed who presented to the ED on 10/27/2024 for bloody drainage from ileostomy. Treated for hemorrhagic shock with improvement in this but with persistent hypotension and fluid retention. Cardiology was consulted for further evaluation.   Interval History:  -Patient seen and examined this AM, sitting upright in hospital bed. -BP remains stable, no complaints of dizziness. Cr relatively stable, with stable LE edema.  -No significant events noted on tele. HR controlled. - Hemoglobin stable today, received blood transfusion.  No reports of bleeding overnight.  Review of systems complete and found to be negative unless listed above   Vitals:   11/15/24 0107 11/15/24 0428 11/15/24 0500 11/15/24 0727  BP: (!) 113/40 (!) 101/39  (!) 112/53  Pulse: 86 83  85  Resp: 18 18  16   Temp: 98.8 F (37.1 C) 98.4 F (36.9 C)    TempSrc: Oral     SpO2: 98% 99%  100%  Weight:   65.5 kg   Height:         Intake/Output Summary (Last 24 hours) at 11/15/2024 9191 Last data filed at 11/15/2024 9470 Gross per 24 hour  Intake 1370 ml  Output 3250 ml  Net -1880 ml     PHYSICAL EXAM General: Chronically ill appearing female, well nourished, in no acute distress. HEENT: Normocephalic and atraumatic. Neck: No JVD.  Lungs: Normal respiratory effort on room air. Clear bilaterally to auscultation. No wheezes, crackles, rhonchi.  Heart: irregularly irregular, controlled rate.  Normal S1 and S2 without gallops or murmurs. Radial & DP pulses 2+ bilaterally. Abdomen: Non-distended appearing.  Msk: Normal strength and tone for age. Extremities: No clubbing, cyanosis.  Pitting edema bilaterally. Neuro: Alert and oriented X 3. Psych: Mood appropriate, affect congruent.    LABS: Basic Metabolic Panel: Recent Labs    11/14/24 0524 11/15/24 0411  NA 130* 128*  K 5.3* 5.2*  CL 94* 93*  CO2 28 25  GLUCOSE 150* 198*  BUN 35* 32*  CREATININE 1.28* 1.24*  CALCIUM  8.8* 8.9  MG 1.7  --    Liver Function Tests: No results for input(s): AST, ALT, ALKPHOS, BILITOT, PROT, ALBUMIN  in the last 72 hours.   No results for input(s): LIPASE, AMYLASE in the last 72 hours. CBC: Recent Labs    11/12/24 0810 11/12/24 1221 11/13/24 0446 11/14/24 0524 11/15/24 0411  WBC 3.4*  --   --   --   --   HGB 7.9* 6.9* 7.6* 6.6* 7.5*  HCT 23.7* 20.4* 23.3*  --   --   MCV 97.5  --   --   --   --   PLT 80*  --   --   --   --    Cardiac Enzymes: No results for input(s): CKTOTAL, CKMB, CKMBINDEX, TROPONINIHS in the last 72 hours. BNP: No results for input(s): BNP in the last 72 hours. D-Dimer: No results for input(s): DDIMER in the last 72 hours. Hemoglobin A1C: No results for input(s): HGBA1C in the last 72 hours. Fasting Lipid Panel: No results for input(s):  CHOL, HDL, LDLCALC, TRIG, CHOLHDL, LDLDIRECT in the last 72 hours. Thyroid  Function Tests: No results for input(s): TSH, T4TOTAL, T3FREE, THYROIDAB in the last 72 hours.  Invalid input(s): FREET3  Anemia Panel: No results for input(s): VITAMINB12, FOLATE, FERRITIN, TIBC, IRON , RETICCTPCT in the last 72 hours.  CT ANGIO GI BLEED Result Date: 11/14/2024 CLINICAL DATA:  GI bleed. EXAM: CTA ABDOMEN AND PELVIS WITHOUT AND WITH CONTRAST TECHNIQUE: Multidetector CT imaging of the abdomen and pelvis was performed using the standard protocol during bolus  administration of intravenous contrast. Multiplanar reconstructed images and MIPs were obtained and reviewed to evaluate the vascular anatomy. RADIATION DOSE REDUCTION: This exam was performed according to the departmental dose-optimization program which includes automated exposure control, adjustment of the mA and/or kV according to patient size and/or use of iterative reconstruction technique. CONTRAST:  OMNIPAQUE  IOHEXOL  350 MG/ML SOLN COMPARISON:  CT abdomen pelvis dated 10/28/2024. FINDINGS: VASCULAR Aorta: Moderate atherosclerotic calcification of the aorta. No abnormal dilatation or dissection. No periaortic fluid collection. Celiac: Patent without evidence of aneurysm, dissection, vasculitis or significant stenosis. SMA: Patent without evidence of aneurysm, dissection, vasculitis or significant stenosis. Renals: Atherosclerotic calcification of the origins of the renal arteries. There is overall decreased renal artery lumen with decreased flow. The renal arteries remain patent. IMA: Faint contrast noted in the proximal RCA. Inflow: Mild atherosclerotic calcification. No aneurysmal dilatation or dissection. The iliac arteries are patent. Proximal Outflow: The visualized proximal pleural patent. Veins: The IVC is unremarkable. The SMV, splenic vein, and main portal vein are patent. No portal venous gas. Review of the MIP images confirms the above findings. NON-VASCULAR Lower chest: The visualized lung bases are clear. There is cardiomegaly. No intra-abdominal free air.  Small ascites. Hepatobiliary: Cirrhosis. Gallstones. The gallbladder is contracted. Pancreas: There is stranding adjacent to the pancreas which may be related to ascites. Correlation with pancreatic enzymes recommended to exclude acute pancreatitis. Spleen: Borderline splenomegaly measuring 14 cm in length. Adrenals/Urinary Tract: The adrenal glands unremarkable. Mild bilateral renal parenchyma atrophy. There is no hydronephrosis or  nephrolithiasis on either side. Subcentimeter hyperdense renal lesions are too small to characterize. The visualized ureters and urinary bladder appear unremarkable. Stomach/Bowel: Postsurgical changes of colectomy with right lower ileostomy. Several thick appearing loop of small bowel in the lower abdomen may be related to diffuse mesenteric edema and ascites or represent enteritis. Clinical correlation is recommended. No bowel obstruction. No evidence of active GI bleed. Lymphatic: No adenopathy. Reproductive: The uterus is grossly unremarkable. Other: Midline vertical anterior pelvic wall incisional scar. Diffuse mesenteric and subcutaneous edema and anasarca. Small ventral supraumbilical fat containing hernia. Musculoskeletal: Degenerative changes of the spine. No acute osseous pathology. IMPRESSION: 1. No evidence of active GI bleed. 2. Postsurgical changes of colectomy with right lower ileostomy. 3. Cirrhosis with small ascites and borderline splenomegaly. 4. Cholelithiasis. 5.  Aortic Atherosclerosis (ICD10-I70.0). Electronically Signed   By: Vanetta Chou M.D.   On: 11/14/2024 20:17     ECHO 07/2024: 1. Left ventricular ejection fraction, by estimation, is 35 to 40%. The left ventricle has moderately decreased function. The left ventricle demonstrates global hypokinesis. Left ventricular diastolic parameters are indeterminate.   2. Mildly D-shaped interventricular septum suggestive of RV pressure/volume overload. Peak RV-RA gradient 36 mmHg. Right ventricular systolic function is moderately reduced. The right ventricular size is severely enlarged.   3. Left atrial size was severely dilated.   4. Right atrial size was severely dilated.   5. S/p mitral valve repair via mTEER with 1  Mitraclip, position appears  A2/P2. Mean gradient 5 mmHg. Moderate residual mitral regurgitation.   6. The tricuspid valve is abnormal. Tricuspid valve regurgitation is severe.   7. The aortic valve is tricuspid. There  is mild calcification of the aortic valve. Aortic valve regurgitation is not visualized. No aortic stenosis is present.   8. The IVC was not visualized.   9. The patient was in atrial fibrillation.   TELEMETRY (personally reviewed): atrial fibrillation rate 80s  EKG (personally reviewed): Atrial fibrillation low voltage nonspecific ST-T wave changes 78 bpm  DATA reviewed by me 11/15/2024: last 24h vitals tele labs imaging I/O, hospitalist progress note, nephrology notes, surgery notes  Principal Problem:   Hemorrhagic shock (HCC) Active Problems:   Atrial fibrillation, chronic (HCC)   Type 2 diabetes mellitus (HCC)   OSA on CPAP   CAD with hx of CABG   Cirrhosis and Portal hypertension  on CT(HCC)   Acute on chronic HFrEF (heart failure with reduced ejection fraction) (HCC)   Hypothyroidism   Acute renal failure   Ileostomy in place [facility])   Esophageal varices without bleeding (HCC)   Protein calorie malnutrition   Shock (HCC)   Acute GI bleeding   Chronic kidney disease, stage 3b (HCC)   Leg edema   Malnutrition of moderate degree    ASSESSMENT AND PLAN: Tammy Arias is a 69 y.o. female with a past medical history of coronary artery disease s/p CABG, ischemic cardiomyopathy, chronic HFrEF, history of atrial fibrillation s/p failed Watchman, pulmonary hypertension, hx GI bleed who presented to the ED on 10/27/2024 for bloody drainage from ileostomy. Treated for hemorrhagic shock with improvement in this but with persistent hypotension and fluid retention. Cardiology was consulted for further evaluation.   # Acute on chronic HFrEF # GI bleed, hemorrhagic shock # Coronary artery disease # Chronic atrial fibrillation -Diuresis as per nephrology. Appreciate their recommendations.  -BP unable to tolerate further GDMT. Continue spironolactone  25 mg twice daily, carvedilol  held. Can consider SGLT2i.  -Midodrine  per primary team. Weaned off pressors.  -Recommend Hgb goal >8 given  underlying cardiac issues.  General surgery now following for additional evaluation of continued bleeding.  Appreciate their recommendations. -Given comorbidities, inability to tolerate anticoagulation, patient would likely not be a candidate for advanced therapies.   This patient's case was discussed and created with Dr. Florencio and he is in agreement.  Signed:  Danita Bloch, PA-C  11/15/2024, 8:08 AM [facility]

## 2024-11-15 NOTE — Plan of Care (Signed)
" °  Problem: Education: Goal: Knowledge of General Education information will improve Description: Including pain rating scale, medication(s)/side effects and non-pharmacologic comfort measures Outcome: Progressing   Problem: Health Behavior/Discharge Planning: Goal: Ability to manage health-related needs will improve Outcome: Progressing   Problem: Clinical Measurements: Goal: Respiratory complications will improve Outcome: Progressing   Problem: Activity: Goal: Risk for activity intolerance will decrease Outcome: Progressing   Problem: Elimination: Goal: Will not experience complications related to bowel motility Outcome: Progressing   Problem: Elimination: Goal: Will not experience complications related to urinary retention Outcome: Progressing   Problem: Pain Managment: Goal: General experience of comfort will improve and/or be controlled Outcome: Progressing   "

## 2024-11-15 NOTE — Progress Notes (Signed)
 Occupational Therapy Treatment Patient Details Name: Tammy Arias MRN: [id number] DOB: Apr 12, 1956 Today's Date: 11/15/2024   History of present illness 68 y.o.female with medical problems of heart failure with reduced ejection fraction 25 to 30%, severe mitral regurgitation, moderate right ventricular systolic dysfunction, severe tricuspid regurgitation, atrial fibrillation, failed Watchman device, chronic anticoagulation with apixaban , GI bleed, coronary disease status post CABG in 2005, aortic stenosis repair, pulmonary hypertension, type 2 diabetes, hypertension, liver cirrhosis with grade 2 esophageal varices, C. 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         She reports to ED for generalized malaise and is being admitted for hypotension  +GIB from ileostomy   OT comments  Pt is supine in bed on arrival and agreeable to OT session. She denies pain. Pt performed bed mobility, transfers, all aspects of toileting and ambulation ~400 ft with supervision to MOD I. She did not utilize AD within the room for short distances and preferred to utilize RW for longer distances. Pt returned to bed with all needs in place and will cont to require skilled acute OT services to maximize her safety and IND to return to PLOF.       If plan is discharge home, recommend the following:  Help with stairs or ramp for entrance;A little help with bathing/dressing/bathroom   Equipment Recommendations  None recommended by OT    Recommendations for Other Services      Precautions / Restrictions Precautions Precautions: Fall Recall of Precautions/Restrictions: Intact Restrictions Weight Bearing Restrictions Per Provider Order: No       Mobility Bed Mobility Overal bed mobility: Modified Independent                  Transfers Overall transfer level: Modified independent   Transfers: Sit to/from Stand Sit to Stand: Supervision, Modified  independent (Device/Increase time)           General transfer comment: supervision without AD within the room and MOD I with RW use out into hallway x400 ft     Balance Overall balance assessment: Needs assistance Sitting-balance support: No upper extremity supported, Feet supported Sitting balance-Leahy Scale: Normal     Standing balance support: Bilateral upper extremity supported, Reliant on assistive device for balance Standing balance-Leahy Scale: Good                             ADL either performed or assessed with clinical judgement   ADL Overall ADL's : Needs assistance/impaired     Grooming: Wash/dry hands;Standing;Modified independent                   Toilet Transfer: Modified Independent   Toileting- Clothing Manipulation and Hygiene: Modified independent;Sit to/from stand Toileting - Clothing Manipulation Details (indicate cue type and reason): managed ostomy independently     Functional mobility during ADLs: Supervision/safety;Rolling walker (2 wheels);Modified independent General ADL Comments: supervision within the room without AD, progressed to MOD I with RW use    Extremity/Trunk Assessment              Vision       Perception     Praxis     Communication Communication Communication: No apparent difficulties Factors Affecting Communication: Hearing impaired   Cognition Arousal: Alert Behavior During Therapy: [facility] for tasks assessed/performed  Following commands: Intact        Cueing   Cueing Techniques: Verbal cues  Exercises      Shoulder Instructions       General Comments      Pertinent Vitals/ Pain       Pain Assessment Pain Assessment: No/denies pain Pain Intervention(s): Monitored during session  Home Living                                          Prior Functioning/Environment              Frequency  Min 2X/week         Progress Toward Goals  OT Goals(current goals can now be found in the care plan section)  Progress towards OT goals: Progressing toward goals  Acute Rehab OT Goals Patient Stated Goal: go home OT Goal Formulation: With patient Time For Goal Achievement: 11/25/24 Potential to Achieve Goals: Fair  Plan      Co-evaluation                 AM-PAC OT 6 Clicks Daily Activity     Outcome Measure   Help from another person eating meals?: None Help from another person taking care of personal grooming?: A Little Help from another person toileting, which includes using toliet, bedpan, or urinal?: None Help from another person bathing (including washing, rinsing, drying)?: A Little Help from another person to put on and taking off regular upper body clothing?: None Help from another person to put on and taking off regular lower body clothing?: A Little 6 Click Score: 21    End of Session Equipment Utilized During Treatment: Rolling walker (2 wheels)  OT Visit Diagnosis: Unsteadiness on feet (R26.81);Repeated falls (R29.6);Muscle weakness (generalized) (M62.81)   Activity Tolerance Patient tolerated treatment well   Patient Left with call bell/phone within reach;with family/visitor present;in bed   Nurse Communication Mobility status        Time: 8851-8791 OT Time Calculation (min): 20 min  Charges: OT General Charges $OT Visit: 1 Visit OT Treatments $Therapeutic Activity: 8-22 mins  Trysten Bernard Chrismon, OTR/L  11/15/2024, 1:20 PM   Devetta Hagenow E Chrismon 11/15/2024, 1:15 PM

## 2024-11-16 DIAGNOSIS — I5023 Acute on chronic systolic (congestive) heart failure: Secondary | ICD-10-CM | POA: Diagnosis not present

## 2024-11-16 DIAGNOSIS — I4891 Unspecified atrial fibrillation: Secondary | ICD-10-CM | POA: Diagnosis not present

## 2024-11-16 DIAGNOSIS — R578 Other shock: Secondary | ICD-10-CM | POA: Diagnosis not present

## 2024-11-16 LAB — TYPE AND SCREEN
ABO/RH(D): A NEG
Antibody Screen: POSITIVE
Unit division: 0
Unit division: 0
Unit division: 0
Unit division: 0

## 2024-11-16 LAB — BPAM RBC
Blood Product Expiration Date: 202601072359
Blood Product Expiration Date: 202601102359
Blood Product Expiration Date: 202601142359
Blood Product Expiration Date: 202601202359
ISSUE DATE / TIME: 202512170853
ISSUE DATE / TIME: 202512181032
Unit Type and Rh: 202601142359
Unit Type and Rh: 600
Unit Type and Rh: 600
Unit Type and Rh: 600
Unit Type and Rh: 9500

## 2024-11-16 LAB — BASIC METABOLIC PANEL WITH GFR
Anion gap: 11 (ref 5–15)
BUN: 27 mg/dL — ABNORMAL HIGH (ref 8–23)
CO2: 21 mmol/L — ABNORMAL LOW (ref 22–32)
Calcium: 8.9 mg/dL (ref 8.9–10.3)
Chloride: 95 mmol/L — ABNORMAL LOW (ref 98–111)
Creatinine, Ser: 1.2 mg/dL — ABNORMAL HIGH (ref 0.44–1.00)
GFR, Estimated: 49 mL/min — ABNORMAL LOW
Glucose, Bld: 181 mg/dL — ABNORMAL HIGH (ref 70–99)
Potassium: 4.7 mmol/L (ref 3.5–5.1)
Sodium: 127 mmol/L — ABNORMAL LOW (ref 135–145)

## 2024-11-16 LAB — GLUCOSE, CAPILLARY
Glucose-Capillary: 147 mg/dL — ABNORMAL HIGH (ref 70–99)
Glucose-Capillary: 193 mg/dL — ABNORMAL HIGH (ref 70–99)
Glucose-Capillary: 159 mg/dL — ABNORMAL HIGH (ref 70–99)
Glucose-Capillary: 142 mg/dL — ABNORMAL HIGH (ref 70–99)

## 2024-11-16 LAB — PREPARE RBC (CROSSMATCH)

## 2024-11-16 LAB — MAGNESIUM: Magnesium: 2 mg/dL (ref 1.7–2.4)

## 2024-11-16 LAB — PHOSPHORUS: Phosphorus: 2.3 mg/dL — ABNORMAL LOW (ref 2.5–4.6)

## 2024-11-16 MED ORDER — SODIUM PHOSPHATES 45 MMOLE/15ML IV SOLN
30.0000 mmol | Freq: Once | INTRAVENOUS | Status: AC
  Administered 2024-11-16: 30 mmol via INTRAVENOUS
  Filled 2024-11-16: qty 10

## 2024-11-16 MED ORDER — K PHOS MONO-SOD PHOS DI & MONO 155-852-130 MG PO TABS
500.0000 mg | ORAL_TABLET | Freq: Once | ORAL | Status: AC
  Administered 2024-11-16: 500 mg via ORAL
  Filled 2024-11-16: qty 2

## 2024-11-16 MED ORDER — PNEUMOCOCCAL 20-VAL CONJ VACC 0.5 ML IM SUSY: 0.5000 mL | PREFILLED_SYRINGE | INTRAMUSCULAR | Status: DC

## 2024-11-16 NOTE — Progress Notes (Signed)
 Got report from previous RN; Pt alert and oriented; pt  stated  she just went to bathroom; vitals stable, no significant changes through the night.

## 2024-11-16 NOTE — Progress Notes (Signed)
 Mobility Specialist Progress Note:    11/16/24 1505  Mobility  Activity Ambulated with assistance  Level of Assistance Standby assist, set-up cues, supervision of patient - no hands on  Assistive Device Front wheel walker  Distance Ambulated (ft) 640 ft  Range of Motion/Exercises Active;All extremities  Activity Response Tolerated well  Mobility visit 1 Mobility  Mobility Specialist Start Time (ACUTE ONLY) 1452  Mobility Specialist Stop Time (ACUTE ONLY) 1505  Mobility Specialist Time Calculation (min) (ACUTE ONLY) 13 min     Shilpa Bushee Mobility Specialist Please contact via Special Educational Needs Teacher or  Rehab office at [phone]

## 2024-11-16 NOTE — Progress Notes (Signed)
 SUBJECTIVE: Patient is feeling much better denies any shortness of breath or chest pai  Vitals:   11/16/24 0345 11/16/24 0500 11/16/24 0815 11/16/24 1225  BP: (!) 91/45  (!) 100/48 (!) 98/43  Pulse: 87  81 85  Resp:   18 17  Temp: 97.9 F (36.6 C)  98 F (36.7 C) 97.6 F (36.4 C)  TempSrc: Oral  Oral Oral  SpO2: 99%  100% 100%  Weight:  65 kg    Height:        Intake/Output Summary (Last 24 hours) at 11/16/2024 1248 Last data filed at 11/16/2024 0900 Gross per 24 hour  Intake 760 ml  Output 1800 ml  Net -1040 ml    LABS: Basic Metabolic Panel: Recent Labs    11/14/24 0524 11/15/24 0411 11/16/24 0455  NA 130* 128* 127*  K 5.3* 5.2* 4.7  CL 94* 93* 95*  CO2 28 25 21*  GLUCOSE 150* 198* 181*  BUN 35* 32* 27*  CREATININE 1.28* 1.24* 1.20*  CALCIUM  8.8* 8.9 8.9  MG 1.7  --  2.0  PHOS  --   --  2.3*   Liver Function Tests: No results for input(s): AST, ALT, ALKPHOS, BILITOT, PROT, ALBUMIN  in the last 72 hours. No results for input(s): LIPASE, AMYLASE in the last 72 hours. CBC: Recent Labs    11/14/24 0524 11/15/24 0411  HGB 6.6* 7.5*   Cardiac Enzymes: No results for input(s): CKTOTAL, CKMB, CKMBINDEX, TROPONINI in the last 72 hours. BNP: Invalid input(s): POCBNP D-Dimer: No results for input(s): DDIMER in the last 72 hours. Hemoglobin A1C: No results for input(s): HGBA1C in the last 72 hours. Fasting Lipid Panel: No results for input(s): CHOL, HDL, LDLCALC, TRIG, CHOLHDL, LDLDIRECT in the last 72 hours. Thyroid  Function Tests: No results for input(s): TSH, T4TOTAL, T3FREE, THYROIDAB in the last 72 hours.  Invalid input(s): FREET3 Anemia Panel: No results for input(s): VITAMINB12, FOLATE, FERRITIN, TIBC, IRON , RETICCTPCT in the last 72 hours.   PHYSICAL EXAM General: Well developed, well nourished, in no acute distress HEENT:  Normocephalic and atramatic Neck:  No JVD.  Lungs: Clear  bilaterally to auscultation and percussion. Heart: HRRR . Normal S1 and S2 without gallops or murmurs.  Abdomen: Bowel sounds are positive, abdomen soft and non-tender  Msk:  Back normal, normal gait. Normal strength and tone for age. Extremities: No clubbing, cyanosis or edema.   Neuro: Alert and oriented X 3. Psych:  Good affect, responds appropriately  TELEMETRY: Atrial fibrillation  ASSESSMENT AND PLAN: Tammy Arias is a 68 y.o. female with a past medical history of coronary artery disease s/p CABG, ischemic cardiomyopathy, chronic HFrEF, history of atrial fibrillation s/p failed Watchman, pulmonary hypertension, hx GI bleed who presented to the ED on 10/27/2024 for bloody drainage from ileostomy. Treated for hemorrhagic shock with improvement in this but with persistent hypotension and fluid retention. Cardiology was consulted for further evaluation.    # Acute on chronic HFrEF # GI bleed, hemorrhagic shock # Coronary artery disease # Chronic atrial fibrillation -Diuresis as per nephrology. Appreciate their recommendations.  -BP unable to tolerate further GDMT. Continue spironolactone  25 mg twice daily, carvedilol  held. Can consider SGLT2i.  -Midodrine  per primary team. Weaned off pressors.  -Recommend Hgb goal >8 given underlying cardiac issues.  General surgery now following for additional evaluation of continued bleeding.  Appreciate their recommendations. -Given comorbidities, inability to tolerate anticoagulation, patient would likely not be a candidate for advanced therapies.      ICD-10-CM  1. Acute GI bleeding  K92.2     2. Shock (HCC)  R57.9     3. Acute renal failure, unspecified acute renal failure type  N17.9       Principal Problem:   Hemorrhagic shock (HCC) Active Problems:   Atrial fibrillation, chronic (HCC)   Type 2 diabetes mellitus (HCC)   OSA on CPAP   CAD with hx of CABG   Cirrhosis and Portal hypertension  on CT(HCC)   Acute on chronic HFrEF (heart  failure with reduced ejection fraction) (HCC)   Hypothyroidism   Acute renal failure   Ileostomy in place [facility])   Esophageal varices without bleeding (HCC)   Protein calorie malnutrition   Shock (HCC)   Acute GI bleeding   Chronic kidney disease, stage 3b (HCC)   Leg edema   Malnutrition of moderate degree    Denyse Bathe, MD, FACC 11/16/2024 12:48 PM

## 2024-11-16 NOTE — Progress Notes (Signed)
 Subjective:  CC: Tammy Arias is a 68 y.o. female  Hospital stay day 20,   bleeding from ostomy site  HPI: No acute issues overnight.  Patient states no further bleeding noted around ostomy.  ROS:  Pertinent positives and negatives noted in the HPI.    Objective:   Temp:  [97.9 F (36.6 C)-98.6 F (37 C)] 98 F (36.7 C) (12/20 0815) Pulse Rate:  [79-98] 81 (12/20 0815) Resp:  [15-18] 18 (12/20 0815) BP: (91-110)/(45-51) 100/48 (12/20 0815) SpO2:  [99 %-100 %] 100 % (12/20 0815) Weight:  [65 kg] 65 kg (12/20 0500)     Height: 5' 1 (154.9 cm) Weight: 65 kg BMI (Calculated): 27.09   Intake/Output this shift:   Intake/Output Summary (Last 24 hours) at 11/16/2024 1135 Last data filed at 11/16/2024 0900 Gross per 24 hour  Intake 760 ml  Output 1800 ml  Net -1040 ml    Constitutional :  alert, cooperative, appears stated age, and no distress  Respiratory:  Clear to auscultation bilaterally  Cardiovascular:  Regular rate and rhythm  Gastrointestinal: soft, non-tender; bowel sounds normal; no masses,  no organomegaly and ostomy site clean pink healthy stoma.  No evidence of bleeding.   Skin: Cool and moist.   Psychiatric: Normal affect, non-agitated, not confused       LABS:     Latest Ref Rng & Units 11/16/2024    4:55 AM 11/15/2024    4:11 AM 11/14/2024    5:24 AM  CMP  Glucose 70 - 99 mg/dL 818  801  849   BUN 8 - 23 mg/dL 27  32  35   Creatinine 0.44 - 1.00 mg/dL 8.79  8.75  8.71   Sodium 135 - 145 mmol/L 127  128  130   Potassium 3.5 - 5.1 mmol/L 4.7  5.2  5.3   Chloride 98 - 111 mmol/L 95  93  94   CO2 22 - 32 mmol/L 21  25  28    Calcium  8.9 - 10.3 mg/dL 8.9  8.9  8.8       Latest Ref Rng & Units 11/15/2024    4:11 AM 11/14/2024    5:24 AM 11/13/2024    4:46 AM  CBC  Hemoglobin 12.0 - 15.0 g/dL 7.5  6.6  7.6   Hematocrit 36.0 - 46.0 %   23.3     RADS: N/a Assessment:   GI bleeding, unclear source although potentially from raw ara on ileostomy  itself, history of end ileostomy, complicated by pertinent comorbidities including significant cardiac disease   Ostomy site continues to be clean with no signs of active bleeding.  Current medical issues are likely to be due to this area.  Surgery will continue to be available peripherally.  Please call with any additional questions or concerns  labs/images/medications/previous chart entries reviewed personally and relevant changes/updates noted above.

## 2024-11-16 NOTE — Progress Notes (Signed)
 No blood seen in colostomy thus far since 12/18.

## 2024-11-16 NOTE — Progress Notes (Signed)
 " [facility]  PROGRESS NOTE   Subjective:   Patient seen at bedside.  Denies any chest pain or shortness of breath.  Objective:  Vital signs: Blood pressure (!) 98/43, pulse 85, temperature 97.6 F (36.4 C), temperature source Oral, resp. rate 17, height 5' 1 (1.549 m), weight 65 kg, SpO2 100%.  Intake/Output Summary (Last 24 hours) at 11/16/2024 1402 Last data filed at 11/16/2024 1324 Gross per 24 hour  Intake 520 ml  Output 2450 ml  Net -1930 ml   Filed Weights   11/13/24 0500 11/15/24 0500 11/16/24 0500  Weight: 65.3 kg 65.5 kg 65 kg     Physical Exam: General:  No acute distress  Head:  Normocephalic, atraumatic. Moist oral mucosal membranes  Eyes:  Anicteric  Neck:  Supple  Lungs:   Clear to auscultation, normal effort  Heart:  S1S2 no rubs  Abdomen:   Soft, nontender, bowel sounds present  Extremities:  peripheral edema.  Neurologic:  Awake, alert, following commands  Skin:  No lesions  Access:     Basic Metabolic Panel: Recent Labs  Lab 11/11/24 0430 11/12/24 0412 11/13/24 0446 11/14/24 0524 11/15/24 0411 11/16/24 0455  NA 138 133* 129* 130* 128* 127*  K 3.5 3.4* 4.6 5.3* 5.2* 4.7  CL 96* 93* 91* 94* 93* 95*  CO2 32 31 27 28 25  21*  GLUCOSE 65* 119* 136* 150* 198* 181*  BUN 46* 41* 36* 35* 32* 27*  CREATININE 1.37* 1.18* 1.22* 1.28* 1.24* 1.20*  CALCIUM  9.2 8.7* 9.5 8.8* 8.9 8.9  MG 1.3* 1.8  --  1.7  --  2.0  PHOS  --  3.3  --   --   --  2.3*   GFR: Estimated Creatinine Clearance: 38.2 mL/min (A) (by C-G formula based on SCr of 1.2 mg/dL (H)).  Liver Function Tests: Recent Labs  Lab 11/12/24 0412  ALBUMIN  3.2*   No results for input(s): LIPASE, AMYLASE in the last 168 hours. No results for input(s): AMMONIA in the last 168 hours.  CBC: Recent Labs  Lab 11/10/24 0346 11/11/24 1643 11/11/24 2230 11/12/24 0810 11/12/24 1221 11/13/24 0446 11/14/24 0524 11/15/24 0411  WBC 4.2  --   --  3.4*  --   --   --   --    HGB 9.6* 9.1* 7.9* 7.9* 6.9* 7.6* 6.6* 7.5*  HCT 29.1* 28.0* 24.9* 23.7* 20.4* 23.3*  --   --   MCV 95.7  --   --  97.5  --   --   --   --   PLT 95*  --   --  80*  --   --   --   --      HbA1C: Hgb A1c MFr Bld  Date/Time Value Ref Range Status  10/28/2024 06:02 PM 5.5 4.8 - 5.6 % Final    Comment:    (NOTE)         Prediabetes: 5.7 - 6.4         Diabetes: >6.4         Glycemic control for adults with diabetes: <7.0   02/28/2024 06:08 AM 5.9 (H) 4.8 - 5.6 % Final    Comment:    (NOTE) Pre diabetes:          5.7%-6.4%  Diabetes:              >6.4%  Glycemic control for   <7.0% adults with diabetes     Urinalysis: No results for input(s): COLORURINE,  LABSPEC, PHURINE, GLUCOSEU, HGBUR, BILIRUBINUR, KETONESUR, PROTEINUR, UROBILINOGEN, NITRITE, LEUKOCYTESUR in the last 72 hours.  Invalid input(s): APPERANCEUR    Imaging: CT ANGIO GI BLEED Result Date: 11/14/2024 CLINICAL DATA:  GI bleed. EXAM: CTA ABDOMEN AND PELVIS WITHOUT AND WITH CONTRAST TECHNIQUE: Multidetector CT imaging of the abdomen and pelvis was performed using the standard protocol during bolus administration of intravenous contrast. Multiplanar reconstructed images and MIPs were obtained and reviewed to evaluate the vascular anatomy. RADIATION DOSE REDUCTION: This exam was performed according to the departmental dose-optimization program which includes automated exposure control, adjustment of the mA and/or kV according to patient size and/or use of iterative reconstruction technique. CONTRAST:  OMNIPAQUE  IOHEXOL  350 MG/ML SOLN COMPARISON:  CT abdomen pelvis dated 10/28/2024. FINDINGS: VASCULAR Aorta: Moderate atherosclerotic calcification of the aorta. No abnormal dilatation or dissection. No periaortic fluid collection. Celiac: Patent without evidence of aneurysm, dissection, vasculitis or significant stenosis. SMA: Patent without evidence of aneurysm, dissection, vasculitis or  significant stenosis. Renals: Atherosclerotic calcification of the origins of the renal arteries. There is overall decreased renal artery lumen with decreased flow. The renal arteries remain patent. IMA: Faint contrast noted in the proximal RCA. Inflow: Mild atherosclerotic calcification. No aneurysmal dilatation or dissection. The iliac arteries are patent. Proximal Outflow: The visualized proximal pleural patent. Veins: The IVC is unremarkable. The SMV, splenic vein, and main portal vein are patent. No portal venous gas. Review of the MIP images confirms the above findings. NON-VASCULAR Lower chest: The visualized lung bases are clear. There is cardiomegaly. No intra-abdominal free air.  Small ascites. Hepatobiliary: Cirrhosis. Gallstones. The gallbladder is contracted. Pancreas: There is stranding adjacent to the pancreas which may be related to ascites. Correlation with pancreatic enzymes recommended to exclude acute pancreatitis. Spleen: Borderline splenomegaly measuring 14 cm in length. Adrenals/Urinary Tract: The adrenal glands unremarkable. Mild bilateral renal parenchyma atrophy. There is no hydronephrosis or nephrolithiasis on either side. Subcentimeter hyperdense renal lesions are too small to characterize. The visualized ureters and urinary bladder appear unremarkable. Stomach/Bowel: Postsurgical changes of colectomy with right lower ileostomy. Several thick appearing loop of small bowel in the lower abdomen may be related to diffuse mesenteric edema and ascites or represent enteritis. Clinical correlation is recommended. No bowel obstruction. No evidence of active GI bleed. Lymphatic: No adenopathy. Reproductive: The uterus is grossly unremarkable. Other: Midline vertical anterior pelvic wall incisional scar. Diffuse mesenteric and subcutaneous edema and anasarca. Small ventral supraumbilical fat containing hernia. Musculoskeletal: Degenerative changes of the spine. No acute osseous pathology.  IMPRESSION: 1. No evidence of active GI bleed. 2. Postsurgical changes of colectomy with right lower ileostomy. 3. Cirrhosis with small ascites and borderline splenomegaly. 4. Cholelithiasis. 5.  Aortic Atherosclerosis (ICD10-I70.0). Electronically Signed   By: Vanetta Chou M.D.   On: 11/14/2024 20:17     Medications:    sodium phosphate  30 mmol in sodium chloride  0.9 % 250 mL infusion 30 mmol (11/16/24 1324)    sodium chloride    Intravenous Once   allopurinol   300 mg Oral Daily   alteplase   2 mg Intracatheter Once   Chlorhexidine  Gluconate Cloth  6 each Topical Nightly   feeding supplement (GLUCERNA SHAKE)  237 mL Oral TID BM   furosemide   40 mg Oral Daily   insulin  aspart  0-15 Units Subcutaneous TID WC   insulin  aspart  0-5 Units Subcutaneous QHS   insulin  aspart  5 Units Subcutaneous TID WC   insulin  glargine  15 Units Subcutaneous QHS   latanoprost   1 drop  Both Eyes QHS   levothyroxine   50 mcg Oral QAC breakfast   methocarbamol   500 mg Oral TID   midodrine   15 mg Oral TID WC   multivitamin with minerals  1 tablet Oral Daily   pantoprazole   40 mg Oral BID   phytonadione   2.5 mg Oral Daily   [START ON 11/17/2024] pneumococcal 20-valent conjugate vaccine  0.5 mL Intramuscular Tomorrow-1000   rosuvastatin   5 mg Oral Daily   sodium chloride  flush  3 mL Intravenous Q12H    Assessment/ Plan:     68 year old female with history of hypertension, coronary artery disease, congestive heart failure, diabetes, atrial fibrillation, history of CABG, cirrhosis now admitted with history of generalized weakness and possible hemorrhagic shock.  #1: Acute kidney injury: Patient developed acute kidney injury most likely secondary to hypotension and prerenal azotemia.  Renal indices are now slowly but steadily improving.  #2: Hypokalemia: Potassium now improved.  #3: CHF/leg edema: Continue furosemide  as ordered.  Patient is advised on the importance of fluid restriction.  #4: GI bleed:  Possibly secondary to esophageal varices and history of AVMs.  She is status post PRBC.  #5: Hypophosphatemia: Supplement phosphate as ordered.  Labs and medications reviewed. Will continue to follow along with you.   LOS: 20 Pinkey Edman, MD [facility] 12/20/20252:02 PM  "

## 2024-11-16 NOTE — Consult Note (Signed)
 PHARMACY CONSULT NOTE - ELECTROLYTES  Pharmacy Consult for Electrolyte Monitoring and Replacement   Recent Labs: Potassium (mmol/L)  Date Value  11/16/2024 4.7   Magnesium  (mg/dL)  Date Value  87/79/7974 2.0   Calcium  (mg/dL)  Date Value  87/79/7974 8.9   Albumin  (g/dL)  Date Value  87/83/7974 3.2 (L)  05/18/2020 4.7   Phosphorus (mg/dL)  Date Value  87/79/7974 2.3 (L)   Sodium (mmol/L)  Date Value  11/16/2024 127 (L)   Height: 5' 1 (154.9 cm) Weight: 65 kg (143 lb 4.8 oz) IBW/kg (Calculated) : 47.8 Estimated Creatinine Clearance: 38.2 mL/min (A) (by C-G formula based on SCr of 1.2 mg/dL (H)).  Assessment  Tammy Arias is a 68 y.o. female presenting with acute HFrEF. PMH significant for CHF, Afib, GI bleeding, CABG, pHTN, liver cirrhosis, C diff s/p colectomy and end ileostomy. Pharmacy has been consulted to monitor and replace electrolytes.  Diet: Carb modified MIVF: N/A Pertinent medications: Lasix  40 mg po Spironolactone  25mg  discontinued due to hyperK, hypoNa  Goal of Therapy: Electrolytes within normal limits  Plan:  Phos 2.3  Will order KPhos neutral tablets 500mg  po x 1 Electrolytes with AM labs  Thank you for allowing pharmacy to be a part of this patients care.  Allean Haas PharmD Clinical Pharmacist 11/16/2024

## 2024-11-16 NOTE — Progress Notes (Signed)
 " PROGRESS NOTE    Tammy Arias  FMW:[id number] DOB: [birth year] DOA: 10/27/2024 PCP: Valora Lynwood FALCON, MD  101A/101A-AA  LOS: 20 days   Brief hospital course:   Assessment & Plan: 68 y.o.female with medical problems of heart failure with reduced ejection fraction 25 to 30%, severe mitral regurgitation, moderate right ventricular systolic dysfunction, severe tricuspid regurgitation, atrial fibrillation, failed Watchman device, chronic anticoagulation with apixaban , GI bleed, coronary disease status post CABG in 2005, aortic stenosis repair, pulmonary hypertension, type 2 diabetes, hypertension, liver cirrhosis with grade 2 esophageal varices, C. 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     She reports to ED for generalized malaise and is being admitted for hypotension +GIB from ileostomy.  Patient initially admitted for hemorrhagic stroke got multiple IV fluid boluses, IV pressors with electrolyte derangements she required Levophed  and was in ICU.  There is a component of cardiogenic shock with extensive cardiac history.  Cardiology was consulted, weaned off pressors, doing good on IV Lasix  therapy with close monitoring of renal function by nephrology team. 12/16. She has bleeding from colostomy which is ongoing issue, no intervention per GI. Repeat Hb 6.9, 1 unit PRBC ordered.   Acute on chronic HFrEF: Cardiogenic shock-resolved EF 35 to 40%, moderate MR history of mitral valve repair, severe TR. Status post volume overloaded after fluid resuscitation. --received IV Lasix  40 mg twice daily per nephrology.  transitionws to oral lasix  40 mg daily on 12/17, d/c'ed aldactone  and coreg . --cont oral lasix  40 mg daily  Hypotension --cont midodrine    AKI on CKD stage III: Nephrology following. Creatinine improved to 1.18. --monitor Cr   Possible adrenal insufficiency: --s/p stress dose steroids    GI bleeding: History of AVMs Esophageal  varices History of recurrent GI bleeds- Does have on and off blood in the ostomy, substantial amount.  Pt has had extensive GI workup in the past, GI has signed off. --7u pRBC so far --GenSurg consulted --CT angio bleeding scan again neg --monitor for bleeding from ostomy    Hyponatremia  Hyperkalemia, resolved --s/p lokelma  x1  Paroxysmal A-fib: status post Watchman procedure.   Not on anticoagulation secondary to GI bleed.   Thrombocytopenia-stable.   Cirrhosis-stable  Vitamin K deficiency: --cont oral vit K daily   Ileostomy status- History of C. 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   Type 2 diabetes mellitus --cont Lantus  15u nightly --mealtime 5u TID --ACHS and SSI   OSA -not compliant with CPAP.   Debility: Refused SNF, wishes to go home with home health.    DVT prophylaxis: SCD/Compression stockings Code Status: DNR  Family Communication:  Level of care: Med-Surg Dispo:   The patient is from: home Anticipated d/c is to: home Anticipated d/c date is: 2-3 days   Subjective and Interval History:  No bleeding since yesterday.   Objective: Vitals:   11/16/24 0500 11/16/24 0815 11/16/24 1225 11/16/24 1720  BP:  (!) 100/48 (!) 98/43 (!) 101/50  Pulse:  81 85 88  Resp:  18 17 17   Temp:  98 F (36.7 C) 97.6 F (36.4 C) 98.3 F (36.8 C)  TempSrc:  Oral Oral Oral  SpO2:  100% 100% 100%  Weight: 65 kg     Height:        Intake/Output Summary (Last 24 hours) at 11/16/2024 1914 Last data filed at 11/16/2024 1700 Gross per 24 hour  Intake 760 ml  Output 1850 ml  Net -1090 ml   Filed Weights   11/13/24 0500 11/15/24 0500 11/16/24 0500  Weight: 65.3 kg 65.5 kg 65 kg    Examination:   Constitutional: NAD, AAOx3 HEENT: conjunctivae and lids normal, EOMI CV: No cyanosis.   RESP: normal respiratory effort, on RA Neuro: II - XII grossly  intact.   Psych: Normal mood and affect.  Appropriate judgement and reason   Data Reviewed: I have personally reviewed labs and imaging studies  Time spent: 35 minutes  Ellouise Haber, MD [facility] If 7PM-7AM, please contact night-coverage 11/16/2024, 7:14 PM   "

## 2024-11-16 NOTE — Progress Notes (Signed)
 Mobility Specialist Progress Note:    11/16/24 0911  Mobility  Activity Ambulated with assistance  Level of Assistance Standby assist, set-up cues, supervision of patient - no hands on  Assistive Device Front wheel walker  Distance Ambulated (ft) 480 ft  Range of Motion/Exercises Active;All extremities  Activity Response Tolerated well  Mobility visit 1 Mobility  Mobility Specialist Start Time (ACUTE ONLY) 0850  Mobility Specialist Stop Time (ACUTE ONLY) 0905  Mobility Specialist Time Calculation (min) (ACUTE ONLY) 15 min   Pt received in bed, eager for mobility. Required supervision to stand and ambulate with RW. Tolerated well, c/o left knee pain during ambulation. Denies all other symptoms. Returned to room, all needs met.  Sherrilee Ditty Mobility Specialist Please contact via Special Educational Needs Teacher or  Rehab office at [phone]

## 2024-11-16 NOTE — Plan of Care (Signed)

## 2024-11-17 DIAGNOSIS — I5023 Acute on chronic systolic (congestive) heart failure: Secondary | ICD-10-CM | POA: Diagnosis not present

## 2024-11-17 DIAGNOSIS — R578 Other shock: Secondary | ICD-10-CM | POA: Diagnosis not present

## 2024-11-17 DIAGNOSIS — I4891 Unspecified atrial fibrillation: Secondary | ICD-10-CM | POA: Diagnosis not present

## 2024-11-17 LAB — BASIC METABOLIC PANEL WITH GFR
Anion gap: 10 (ref 5–15)
BUN: 28 mg/dL — ABNORMAL HIGH (ref 8–23)
CO2: 23 mmol/L (ref 22–32)
Calcium: 8.9 mg/dL (ref 8.9–10.3)
Chloride: 95 mmol/L — ABNORMAL LOW (ref 98–111)
Creatinine, Ser: 1.14 mg/dL — ABNORMAL HIGH (ref 0.44–1.00)
GFR, Estimated: 52 mL/min — ABNORMAL LOW
Glucose, Bld: 174 mg/dL — ABNORMAL HIGH (ref 70–99)
Potassium: 4.2 mmol/L (ref 3.5–5.1)
Sodium: 129 mmol/L — ABNORMAL LOW (ref 135–145)

## 2024-11-17 LAB — GLUCOSE, CAPILLARY
Glucose-Capillary: 136 mg/dL — ABNORMAL HIGH (ref 70–99)
Glucose-Capillary: 156 mg/dL — ABNORMAL HIGH (ref 70–99)
Glucose-Capillary: 184 mg/dL — ABNORMAL HIGH (ref 70–99)
Glucose-Capillary: 214 mg/dL — ABNORMAL HIGH (ref 70–99)

## 2024-11-17 LAB — PREPARE RBC (CROSSMATCH)

## 2024-11-17 LAB — HEMOGLOBIN: Hemoglobin: 7.1 g/dL — ABNORMAL LOW (ref 12.0–15.0)

## 2024-11-17 LAB — PHOSPHORUS: Phosphorus: 3.6 mg/dL (ref 2.5–4.6)

## 2024-11-17 MED ORDER — SODIUM CHLORIDE 0.9% IV SOLUTION: Freq: Once | INTRAVENOUS | Status: AC

## 2024-11-17 NOTE — Consult Note (Signed)
 PHARMACY CONSULT NOTE - ELECTROLYTES  Pharmacy Consult for Electrolyte Monitoring and Replacement   Recent Labs: Potassium (mmol/L)  Date Value  11/17/2024 4.2   Magnesium  (mg/dL)  Date Value  87/79/7974 2.0   Calcium  (mg/dL)  Date Value  87/78/7974 8.9   Albumin  (g/dL)  Date Value  87/83/7974 3.2 (L)  05/18/2020 4.7   Phosphorus (mg/dL)  Date Value  87/78/7974 3.6   Sodium (mmol/L)  Date Value  11/17/2024 129 (L)   Height: 5' 1 (154.9 cm) Weight: 64.8 kg (142 lb 13.7 oz) IBW/kg (Calculated) : 47.8 Estimated Creatinine Clearance: 40.1 mL/min (A) (by C-G formula based on SCr of 1.14 mg/dL (H)).  Assessment  Tammy Arias is a 68 y.o. female presenting with acute HFrEF. PMH significant for CHF, Afib, GI bleeding, CABG, pHTN, liver cirrhosis, C diff s/p colectomy and end ileostomy. Pharmacy has been consulted to monitor and replace electrolytes.  Diet: Carb modified MIVF: N/A Pertinent medications: Lasix  40 mg po Spironolactone  25mg  discontinued due to hyperK, hypoNa  Goal of Therapy: Electrolytes within normal limits  Plan:  No electrolyte replacement at this time Electrolytes with AM labs  Thank you for allowing pharmacy to be a part of this patients care.  Allean Haas PharmD Clinical Pharmacist 11/17/2024

## 2024-11-17 NOTE — Progress Notes (Signed)
Cardiac monitoring discontinued per order.

## 2024-11-17 NOTE — Progress Notes (Signed)
 SUBJECTIVE: Patient is breathing much better denies any shortness of breath or chest pain.  Patient is anxious to go home.   Vitals:   11/17/24 0454 11/17/24 0456 11/17/24 0500 11/17/24 0804  BP: (!) 91/43 (!) 99/45  (!) 107/41  Pulse: 90 87  88  Resp: 18   18  Temp: 98.1 F (36.7 C)   97.6 F (36.4 C)  TempSrc: Oral   Oral  SpO2: 100% 100%  100%  Weight:   64.8 kg   Height:        Intake/Output Summary (Last 24 hours) at 11/17/2024 1214 Last data filed at 11/17/2024 0900 Gross per 24 hour  Intake 483 ml  Output 2580 ml  Net -2097 ml    LABS: Basic Metabolic Panel: Recent Labs    11/16/24 0455 11/17/24 0612  NA 127* 129*  K 4.7 4.2  CL 95* 95*  CO2 21* 23  GLUCOSE 181* 174*  BUN 27* 28*  CREATININE 1.20* 1.14*  CALCIUM  8.9 8.9  MG 2.0  --   PHOS 2.3* 3.6   Liver Function Tests: No results for input(s): AST, ALT, ALKPHOS, BILITOT, PROT, ALBUMIN  in the last 72 hours. No results for input(s): LIPASE, AMYLASE in the last 72 hours. CBC: Recent Labs    11/15/24 0411 11/17/24 0612  HGB 7.5* 7.1*   Cardiac Enzymes: No results for input(s): CKTOTAL, CKMB, CKMBINDEX, TROPONINI in the last 72 hours. BNP: Invalid input(s): POCBNP D-Dimer: No results for input(s): DDIMER in the last 72 hours. Hemoglobin A1C: No results for input(s): HGBA1C in the last 72 hours. Fasting Lipid Panel: No results for input(s): CHOL, HDL, LDLCALC, TRIG, CHOLHDL, LDLDIRECT in the last 72 hours. Thyroid  Function Tests: No results for input(s): TSH, T4TOTAL, T3FREE, THYROIDAB in the last 72 hours.  Invalid input(s): FREET3 Anemia Panel: No results for input(s): VITAMINB12, FOLATE, FERRITIN, TIBC, IRON , RETICCTPCT in the last 72 hours.   PHYSICAL EXAM General: Well developed, well nourished, in no acute distress HEENT:  Normocephalic and atramatic Neck:  No JVD.  Lungs: Clear bilaterally to auscultation and  percussion. Heart: HRRR . Normal S1 and S2 without gallops or murmurs.  Abdomen: Bowel sounds are positive, abdomen soft and non-tender  Msk:  Back normal, normal gait. Normal strength and tone for age. Extremities: No clubbing, cyanosis or edema.   Neuro: Alert and oriented X 3. Psych:  Good affect, responds appropriately  TELEMETRY: Sinus rhythm  ASSESSMENT AND PLAN: Tammy Arias is a 68 y.o. female with a past medical history of coronary artery disease s/p CABG, ischemic cardiomyopathy, chronic HFrEF, history of atrial fibrillation s/p failed Watchman, pulmonary hypertension, hx GI bleed who presented to the ED on 10/27/2024 for bloody drainage from ileostomy. Treated for hemorrhagic shock with improvement in this but with persistent hypotension and fluid retention. Cardiology was consulted for further evaluation.    # Acute on chronic HFrEF # GI bleed, hemorrhagic shock # Coronary artery disease # Chronic atrial fibrillation -Diuresis as per nephrology. Appreciate their recommendations.  -BP unable to tolerate further GDMT. Continue spironolactone  25 mg twice daily, carvedilol  held. Can consider SGLT2i.  -Midodrine  per primary team. Weaned off pressors.  -Recommend Hgb goal >8 given underlying cardiac issues.  General surgery now following for additional evaluation of continued bleeding.  Appreciate their recommendations. -Given comorbidities, inability to tolerate anticoagulation, patient would likely not be a candidate for advanced therapies.  Patient this morning is feeling much better and says he might be getting blood transfusion.  Denies  any shortness of breath at this time.  Creatinine was 1.2.   ICD-10-CM   1. Acute GI bleeding  K92.2     2. Shock (HCC)  R57.9     3. Acute renal failure, unspecified acute renal failure type  N17.9       Principal Problem:   Hemorrhagic shock (HCC) Active Problems:   Atrial fibrillation, chronic (HCC)   Type 2 diabetes mellitus (HCC)    OSA on CPAP   CAD with hx of CABG   Cirrhosis and Portal hypertension  on CT(HCC)   Acute on chronic HFrEF (heart failure with reduced ejection fraction) (HCC)   Hypothyroidism   Acute renal failure   Ileostomy in place [facility])   Esophageal varices without bleeding (HCC)   Protein calorie malnutrition   Shock (HCC)   Acute GI bleeding   Chronic kidney disease, stage 3b (HCC)   Leg edema   Malnutrition of moderate degree    Denyse Bathe, MD, FACC 11/17/2024 12:14 PM

## 2024-11-17 NOTE — Plan of Care (Signed)
" °  Problem: Clinical Measurements: Goal: Ability to maintain clinical measurements within normal limits will improve Outcome: Progressing   Problem: Coping: Goal: Level of anxiety will decrease Outcome: Progressing   Problem: Nutrition: Goal: Adequate nutrition will be maintained Outcome: Progressing   Problem: Coping: Goal: Level of anxiety will decrease Outcome: Progressing   Problem: Pain Managment: Goal: General experience of comfort will improve and/or be controlled Outcome: Progressing   Problem: Safety: Goal: Ability to remain free from injury will improve Outcome: Progressing   Problem: Skin Integrity: Goal: Risk for impaired skin integrity will decrease Outcome: Progressing   "

## 2024-11-17 NOTE — Progress Notes (Addendum)
 " PROGRESS NOTE    Tammy Arias  FMW:[id number] DOB: Sep 06, 1956 DOA: 10/27/2024 PCP: Valora Lynwood FALCON, MD  [id number]-[id number]  LOS: 21 days   Brief hospital course:   Assessment & Plan: 68 y.o.female with medical problems of heart failure with reduced ejection fraction 25 to 30%, severe mitral regurgitation, moderate right ventricular systolic dysfunction, severe tricuspid regurgitation, atrial fibrillation, failed Watchman device, chronic anticoagulation with apixaban , GI bleed, coronary disease status post CABG in 2005, aortic stenosis repair, pulmonary hypertension, type 2 diabetes, hypertension, liver cirrhosis with grade 2 esophageal varices, C. 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     She reports to ED for generalized malaise and is being admitted for hypotension +GIB from ileostomy.  Patient initially admitted for hemorrhagic stroke got multiple IV fluid boluses, IV pressors with electrolyte derangements she required Levophed  and was in ICU.  There is a component of cardiogenic shock with extensive cardiac history.  Cardiology was consulted, weaned off pressors, doing good on IV Lasix  therapy with close monitoring of renal function by nephrology team. 12/16. She has bleeding from colostomy which is ongoing issue, no intervention per GI. Repeat Hb 6.9, 1 unit PRBC ordered.   GI bleeding: History of AVMs Esophageal varices History of recurrent GI bleeds- --had on and off blood in the ostomy, substantial amount.  Pt has had extensive GI workup in the past, GI has signed off. --GenSurg consulted, found some oozing from the inferior lateral edge of the stoma at the mucosa, but this stopped on its own relatively quick. Silver  nitrate was applied to cauterize this on 12/18.  Unclear that this would cause so much blood loss to require transfusions, however, no more bleeding into ostomy since cauterization. --CT angio bleeding scan again  neg --monitor for blood in ostomy bag  Acute on chronic anemia due to GI blood loss --7u pRBC so far --8th unit pRBC today for Hgb 7.1 --after discharge, will need to f/u with Dr. Melanee for Hgb monitoring and PRN transfusion.  Acute on chronic HFrEF: Cardiogenic shock-resolved EF 35 to 40%, moderate MR history of mitral valve repair, severe TR. Status post volume overloaded after fluid resuscitation. --received IV Lasix  40 mg twice daily per nephrology.  transitionws to oral lasix  40 mg daily on 12/17, d/c'ed aldactone  and coreg . --cont oral lasix  40 mg daily  Hypotension --cont midodrine    AKI on CKD stage III: Nephrology following. Creatinine improved to 1.18. --monitor Cr   Possible adrenal insufficiency: --s/p stress dose steroids    Hyponatremia  Hyperkalemia, resolved --s/p lokelma  x1  Paroxysmal A-fib: status post Watchman procedure.   Not on anticoagulation secondary to GI bleed.   Thrombocytopenia-stable.   Cirrhosis-stable  Vitamin K deficiency: --cont oral vit K daily   Ileostomy status- History of C. 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   Type 2 diabetes mellitus --cont Lantus  15u nightly --mealtime 5u TID --ACHS and SSI   OSA -not compliant with CPAP.   Debility: Refused SNF, wishes to go home with home health.    DVT prophylaxis: SCD/Compression stockings Code Status: DNR  Family Communication:  Level of care: Med-Surg Dispo:   The patient is from: home Anticipated d/c is to: home Anticipated d/c date is: 1-2 days   Subjective and Interval History:  No blood in ostomy bag.  Pt continued to complain of bilateral hand cramps, lasting about a minute each  time, 3-4x per day.  Robaxin  did not help.   Objective: Vitals:   11/17/24 0456 11/17/24 0500 11/17/24 0804 11/17/24 1602  BP: (!) 99/45  (!) 107/41 108/61  Pulse: 87  88 79   Resp:   18 18  Temp:   97.6 F (36.4 C) 98.1 F (36.7 C)  TempSrc:   Oral   SpO2: 100%  100% 100%  Weight:  64.8 kg    Height:        Intake/Output Summary (Last 24 hours) at 11/17/2024 1926 Last data filed at 11/17/2024 1900 Gross per 24 hour  Intake 723 ml  Output 1480 ml  Net -757 ml   Filed Weights   11/15/24 0500 11/16/24 0500 11/17/24 0500  Weight: 65.5 kg 65 kg 64.8 kg    Examination:   Constitutional: NAD, AAOx3 HEENT: conjunctivae and lids normal, EOMI CV: No cyanosis.   RESP: normal respiratory effort, on RA Abdomen: ostomy bag with loose brown stool Neuro: II - XII grossly intact.   Psych: Normal mood and affect.  Appropriate judgement and reason   Data Reviewed: I have personally reviewed labs and imaging studies  Time spent: 35 minutes  Ellouise Haber, MD [facility] If 7PM-7AM, please contact night-coverage 11/17/2024, 7:26 PM   "

## 2024-11-17 NOTE — Progress Notes (Signed)
 Mobility Specialist Progress Note:    11/17/24 0930  Mobility  Activity Ambulated with assistance  Level of Assistance Modified independent, requires aide device or extra time  Assistive Device Front wheel walker  Distance Ambulated (ft) 640 ft  Range of Motion/Exercises Active;All extremities  Activity Response Tolerated well  Mobility visit 1 Mobility  Mobility Specialist Start Time (ACUTE ONLY) 0914  Mobility Specialist Stop Time (ACUTE ONLY) [id number]  Mobility Specialist Time Calculation (min) (ACUTE ONLY) 13 min   Pt received in bed, eager for mobility. Modi to stand and ambulate with RW. Tolerated well, asx throughout. Returned to room, all needs met.  Sherrilee Ditty Mobility Specialist Please contact via Special Educational Needs Teacher or  Rehab office at [phone]

## 2024-11-17 NOTE — Plan of Care (Signed)
   Problem: Education: Goal: Knowledge of General Education information will improve Description Including pain rating scale, medication(s)/side effects and non-pharmacologic comfort measures Outcome: Progressing

## 2024-11-17 NOTE — Progress Notes (Signed)
 " [facility]  PROGRESS NOTE   Subjective:   Patient seen at bedside. Denies any chest pain or shortness of breath.   Objective:  Vital signs: Blood pressure (!) 107/41, pulse 88, temperature 97.6 F (36.4 C), temperature source Oral, resp. rate 18, height 5' 1 (1.549 m), weight 64.8 kg, SpO2 100%.  Intake/Output Summary (Last 24 hours) at 11/17/2024 1404 Last data filed at 11/17/2024 0900 Gross per 24 hour  Intake 243 ml  Output 1930 ml  Net -1687 ml   Filed Weights   11/15/24 0500 11/16/24 0500 11/17/24 0500  Weight: 65.5 kg 65 kg 64.8 kg     Physical Exam: General:  No acute distress  Head:  Normocephalic, atraumatic. Moist oral mucosal membranes  Eyes:  Anicteric  Neck:  Supple  Lungs:   Clear to auscultation, normal effort  Heart:  S1S2 no rubs  Abdomen:   Soft, nontender, bowel sounds present  Extremities:  peripheral edema.  Neurologic:  Awake, alert, following commands  Skin:  No lesions  Access:     Basic Metabolic Panel: Recent Labs  Lab 11/11/24 0430 11/12/24 0412 11/13/24 0446 11/14/24 0524 11/15/24 0411 11/16/24 0455 11/17/24 0612  NA 138 133* 129* 130* 128* 127* 129*  K 3.5 3.4* 4.6 5.3* 5.2* 4.7 4.2  CL 96* 93* 91* 94* 93* 95* 95*  CO2 32 31 27 28 25  21* 23  GLUCOSE 65* 119* 136* 150* 198* 181* 174*  BUN 46* 41* 36* 35* 32* 27* 28*  CREATININE 1.37* 1.18* 1.22* 1.28* 1.24* 1.20* 1.14*  CALCIUM  9.2 8.7* 9.5 8.8* 8.9 8.9 8.9  MG 1.3* 1.8  --  1.7  --  2.0  --   PHOS  --  3.3  --   --   --  2.3* 3.6   GFR: Estimated Creatinine Clearance: 40.1 mL/min (A) (by C-G formula based on SCr of 1.14 mg/dL (H)).  Liver Function Tests: Recent Labs  Lab 11/12/24 0412  ALBUMIN  3.2*   No results for input(s): LIPASE, AMYLASE in the last 168 hours. No results for input(s): AMMONIA in the last 168 hours.  CBC: Recent Labs  Lab 11/11/24 1643 11/11/24 2230 11/12/24 0810 11/12/24 1221 11/13/24 0446 11/14/24 0524 11/15/24 0411  11/17/24 0612  WBC  --   --  3.4*  --   --   --   --   --   HGB 9.1* 7.9* 7.9* 6.9* 7.6* 6.6* 7.5* 7.1*  HCT 28.0* 24.9* 23.7* 20.4* 23.3*  --   --   --   MCV  --   --  97.5  --   --   --   --   --   PLT  --   --  80*  --   --   --   --   --      HbA1C: Hgb A1c MFr Bld  Date/Time Value Ref Range Status  10/28/2024 06:02 PM 5.5 4.8 - 5.6 % Final    Comment:    (NOTE)         Prediabetes: 5.7 - 6.4         Diabetes: >6.4         Glycemic control for adults with diabetes: <7.0   02/28/2024 06:08 AM 5.9 (H) 4.8 - 5.6 % Final    Comment:    (NOTE) Pre diabetes:          5.7%-6.4%  Diabetes:              >  6.4%  Glycemic control for   <7.0% adults with diabetes     Urinalysis: No results for input(s): COLORURINE, LABSPEC, PHURINE, GLUCOSEU, HGBUR, BILIRUBINUR, KETONESUR, PROTEINUR, UROBILINOGEN, NITRITE, LEUKOCYTESUR in the last 72 hours.  Invalid input(s): APPERANCEUR    Imaging: No results found.   Medications:     sodium chloride    Intravenous Once   sodium chloride    Intravenous Once   allopurinol   300 mg Oral Daily   alteplase   2 mg Intracatheter Once   Chlorhexidine  Gluconate Cloth  6 each Topical Nightly   feeding supplement (GLUCERNA SHAKE)  237 mL Oral TID BM   furosemide   40 mg Oral Daily   insulin  aspart  0-15 Units Subcutaneous TID WC   insulin  aspart  0-5 Units Subcutaneous QHS   insulin  aspart  5 Units Subcutaneous TID WC   insulin  glargine  15 Units Subcutaneous QHS   latanoprost   1 drop Both Eyes QHS   levothyroxine   50 mcg Oral QAC breakfast   midodrine   15 mg Oral TID WC   multivitamin with minerals  1 tablet Oral Daily   pantoprazole   40 mg Oral BID   phytonadione   2.5 mg Oral Daily   pneumococcal 20-valent conjugate vaccine  0.5 mL Intramuscular Tomorrow-1000   rosuvastatin   5 mg Oral Daily   sodium chloride  flush  3 mL Intravenous Q12H    Assessment/ Plan:     68 year old female with history of hypertension,  coronary artery disease, congestive heart failure, diabetes, atrial fibrillation, history of CABG, cirrhosis now admitted with history of generalized weakness and possible hemorrhagic shock.   #1: Acute kidney injury: Patient developed acute kidney injury most likely secondary to hypotension and prerenal azotemia.  Renal indices are now slowly but steadily improving.   #2: Hypokalemia: Potassium now improved.   #3: CHF/leg edema: Continue furosemide  as ordered.  Patient is advised on the importance of fluid restriction.   #4: GI bleed: Possibly secondary to esophageal varices and history of AVMs.  She is status post PRBC.   #5: Hypophosphatemia: Supplement phosphate as ordered.  #6: Hyponatremia: Possibly secondary to diuretics will continue to monitor closely.    Labs and medications reviewed. Will continue to follow along with you.   LOS: 21 Pinkey Edman, MD [facility] 12/21/20252:04 PM  "

## 2024-11-18 ENCOUNTER — Other Ambulatory Visit

## 2024-11-18 ENCOUNTER — Ambulatory Visit: Admitting: Oncology

## 2024-11-18 LAB — BASIC METABOLIC PANEL WITH GFR
Anion gap: 12 (ref 5–15)
BUN: 26 mg/dL — ABNORMAL HIGH (ref 8–23)
CO2: 22 mmol/L (ref 22–32)
Calcium: 9.1 mg/dL (ref 8.9–10.3)
Chloride: 97 mmol/L — ABNORMAL LOW (ref 98–111)
Creatinine, Ser: 1.07 mg/dL — ABNORMAL HIGH (ref 0.44–1.00)
GFR, Estimated: 56 mL/min — ABNORMAL LOW
Glucose, Bld: 96 mg/dL (ref 70–99)
Potassium: 4.2 mmol/L (ref 3.5–5.1)
Sodium: 131 mmol/L — ABNORMAL LOW (ref 135–145)

## 2024-11-18 LAB — HEMOGLOBIN: Hemoglobin: 8.5 g/dL — ABNORMAL LOW (ref 12.0–15.0)

## 2024-11-18 LAB — MAGNESIUM: Magnesium: 1.8 mg/dL (ref 1.7–2.4)

## 2024-11-18 LAB — GLUCOSE, CAPILLARY: Glucose-Capillary: 103 mg/dL — ABNORMAL HIGH (ref 70–99)

## 2024-11-18 LAB — PHOSPHORUS: Phosphorus: 3.6 mg/dL (ref 2.5–4.6)

## 2024-11-18 MED ORDER — GLUCERNA SHAKE PO LIQD: 237.0000 mL | Freq: Three times a day (TID) | ORAL | 0 refills | Status: DC

## 2024-11-18 MED ORDER — FUROSEMIDE 40 MG PO TABS: 40.0000 mg | ORAL_TABLET | Freq: Every day | ORAL | 1 refills | Status: DC

## 2024-11-18 MED ORDER — PANTOPRAZOLE SODIUM 40 MG PO TBEC: 40.0000 mg | DELAYED_RELEASE_TABLET | Freq: Two times a day (BID) | ORAL | 3 refills | Status: DC

## 2024-11-18 MED ORDER — MIDODRINE HCL 5 MG PO TABS: 15.0000 mg | ORAL_TABLET | Freq: Three times a day (TID) | ORAL | 2 refills | Status: DC

## 2024-11-18 MED ORDER — ADULT MULTIVITAMIN W/MINERALS CH: 1.0000 | ORAL_TABLET | Freq: Every day | ORAL | 0 refills | Status: DC

## 2024-11-18 MED ORDER — PHYTONADIONE 5 MG PO TABS: 2.5000 mg | ORAL_TABLET | Freq: Every day | ORAL | 0 refills | Status: DC

## 2024-11-18 NOTE — Progress Notes (Signed)
 " [facility]  ROUNDING NOTE   Subjective:   Tammy Arias is a 68 y.o.female with medical problems of heart failure with reduced ejection fraction 25 to 30%, severe mitral regurgitation, moderate right ventricular systolic dysfunction, severe tricuspid regurgitation, atrial fibrillation, failed Watchman device, chronic anticoagulation with apixaban , GI bleed, coronary disease status post CABG in 2005, aortic stenosis repair, pulmonary hypertension, type 2 diabetes, hypertension, liver cirrhosis with grade 2 esophageal varices, C. 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 She reports to ED for generalized malaise and is being admitted for Hemorrhagic shock (HCC) [R57.8] Shock (HCC) [R57.9] Acute GI bleeding [K92.2] Acute renal failure, unspecified acute renal failure type [N17.9]  Update:  Patient seen sitting at bedside Alert and oriented Room air   Objective:  Vital signs in last 24 hours:  Temp:  [97.9 F (36.6 C)-99.1 F (37.3 C)] 97.9 F (36.6 C) (12/22 0717) Pulse Rate:  [72-86] 72 (12/22 0717) Resp:  [17-18] 17 (12/22 0717) BP: (92-116)/(42-61) 92/48 (12/22 0717) SpO2:  [99 %-100 %] 99 % (12/22 0717) Weight:  [66.9 kg-67.5 kg] 66.9 kg (12/22 0417)  Weight change: 2.7 kg Filed Weights   11/17/24 0500 11/18/24 0340 11/18/24 0417  Weight: 64.8 kg 67.5 kg 66.9 kg    Intake/Output: I/O last 3 completed shifts: In: 1372.3 [P.O.:957; I.V.:59; Blood:356.3] Out: 3930 [Urine:2450; Stool:1480]   Intake/Output this shift:  No intake/output data recorded.  Physical Exam: General: NAD  Head: Normocephalic, atraumatic. Moist oral mucosa  Eyes: Anicteric  Lungs:  Diminished, normal effort  Heart: Regular rate and rhythm  Abdomen:  Soft, nontender. Ileostomy   Extremities: trace peripheral edema.  Neurologic: Awake, alert, conversant  Skin: Warm,dry, no rash  Access: None    Basic Metabolic Panel: Recent Labs   Lab 11/12/24 0412 11/13/24 0446 11/14/24 0524 11/15/24 0411 11/16/24 0455 11/17/24 0612 11/18/24 0604  NA 133*   < > 130* 128* 127* 129* 131*  K 3.4*   < > 5.3* 5.2* 4.7 4.2 4.2  CL 93*   < > 94* 93* 95* 95* 97*  CO2 31   < > 28 25 21* 23 22  GLUCOSE 119*   < > 150* 198* 181* 174* 96  BUN 41*   < > 35* 32* 27* 28* 26*  CREATININE 1.18*   < > 1.28* 1.24* 1.20* 1.14* 1.07*  CALCIUM  8.7*   < > 8.8* 8.9 8.9 8.9 9.1  MG 1.8  --  1.7  --  2.0  --  1.8  PHOS 3.3  --   --   --  2.3* 3.6 3.6   < > = values in this interval not displayed.    Liver Function Tests: Recent Labs  Lab 11/12/24 0412  ALBUMIN  3.2*    No results for input(s): LIPASE, AMYLASE in the last 168 hours.  No results for input(s): AMMONIA in the last 168 hours.  CBC: Recent Labs  Lab 11/11/24 1643 11/11/24 2230 11/12/24 0810 11/12/24 1221 11/13/24 0446 11/14/24 0524 11/15/24 0411 11/17/24 0612 11/18/24 0604  WBC  --   --  3.4*  --   --   --   --   --   --   HGB 9.1* 7.9* 7.9* 6.9* 7.6* 6.6* 7.5* 7.1* 8.5*  HCT 28.0* 24.9* 23.7* 20.4* 23.3*  --   --   --   --   MCV  --   --  97.5  --   --   --   --   --   --  PLT  --   --  80*  --   --   --   --   --   --     Cardiac Enzymes: No results for input(s): CKTOTAL, CKMB, CKMBINDEX, TROPONINI in the last 168 hours.  BNP: Invalid input(s): POCBNP  CBG: Recent Labs  Lab 11/17/24 0802 11/17/24 1223 11/17/24 1706 11/17/24 1935 11/18/24 0719  GLUCAP 136* 156* 184* 214* 103*    Microbiology: Results for orders placed or performed during the hospital encounter of 10/27/24  Resp panel by RT-PCR (RSV, Flu A&B, Covid) Anterior Nasal Swab     Status: None   Collection Time: 10/27/24 11:01 AM   Specimen: Anterior Nasal Swab  Result Value Ref Range Status   SARS Coronavirus 2 by RT PCR NEGATIVE NEGATIVE Final    Comment: (NOTE) SARS-CoV-2 target nucleic acids are NOT DETECTED.  The SARS-CoV-2 RNA is generally detectable in upper  respiratory specimens during the acute phase of infection. The lowest concentration of SARS-CoV-2 viral copies this assay can detect is 138 copies/mL. A negative result does not preclude SARS-Cov-2 infection and should not be used as the sole basis for treatment or other patient management decisions. A negative result may occur with  improper specimen collection/handling, submission of specimen other than nasopharyngeal swab, presence of viral mutation(s) within the areas targeted by this assay, and inadequate number of viral copies(<138 copies/mL). A negative result must be combined with clinical observations, patient history, and epidemiological information. The expected result is Negative.  Fact Sheet for Patients:  bloggercourse.com  Fact Sheet for Healthcare Providers:  seriousbroker.it  This test is no t yet approved or cleared by the [place]  FDA and  has been authorized for detection and/or diagnosis of SARS-CoV-2 by FDA under an Emergency Use Authorization (EUA). This EUA will remain  in effect (meaning this test can be used) for the duration of the COVID-19 declaration under Section 564(b)(1) of the Act, 21 U.S.C.section 360bbb-3(b)(1), unless the authorization is terminated  or revoked sooner.       Influenza A by PCR NEGATIVE NEGATIVE Final   Influenza B by PCR NEGATIVE NEGATIVE Final    Comment: (NOTE) The Xpert Xpress SARS-CoV-2/FLU/RSV plus assay is intended as an aid in the diagnosis of influenza from Nasopharyngeal swab specimens and should not be used as a sole basis for treatment. Nasal washings and aspirates are unacceptable for Xpert Xpress SARS-CoV-2/FLU/RSV testing.  Fact Sheet for Patients: bloggercourse.com  Fact Sheet for Healthcare Providers: seriousbroker.it  This test is not yet approved or cleared by the [place]  FDA and has been  authorized for detection and/or diagnosis of SARS-CoV-2 by FDA under an Emergency Use Authorization (EUA). This EUA will remain in effect (meaning this test can be used) for the duration of the COVID-19 declaration under Section 564(b)(1) of the Act, 21 U.S.C. section 360bbb-3(b)(1), unless the authorization is terminated or revoked.     Resp Syncytial Virus by PCR NEGATIVE NEGATIVE Final    Comment: (NOTE) Fact Sheet for Patients: bloggercourse.com  Fact Sheet for Healthcare Providers: seriousbroker.it  This test is not yet approved or cleared by the [place]  FDA and has been authorized for detection and/or diagnosis of SARS-CoV-2 by FDA under an Emergency Use Authorization (EUA). This EUA will remain in effect (meaning this test can be used) for the duration of the COVID-19 declaration under Section 564(b)(1) of the Act, 21 U.S.C. section 360bbb-3(b)(1), unless the authorization is terminated or revoked.  Performed at [facility], [phone]  [address] [place], [place] [address]   Blood Culture (routine x 2)     Status: None   Collection Time: 10/27/24 12:18 PM   Specimen: Right Antecubital; Blood  Result Value Ref Range Status   Specimen Description RIGHT ANTECUBITAL  Final   Special Requests   Final    BOTTLES DRAWN AEROBIC AND ANAEROBIC Blood Culture adequate volume   Culture   Final    NO GROWTH 5 DAYS Performed at [facility], [address] [place], [place] [address]    Report Status 11/01/2024 FINAL  Final  Blood Culture (routine x 2)     Status: None   Collection Time: 10/27/24 12:18 PM   Specimen: Left Antecubital; Blood  Result Value Ref Range Status   Specimen Description LEFT ANTECUBITAL  Final   Special Requests   Final    BOTTLES DRAWN AEROBIC AND ANAEROBIC Blood Culture results may not be optimal due to an inadequate volume of blood received in culture bottles   Culture   Final    NO  GROWTH 5 DAYS Performed at [facility] [address] [place], [place] [address]    Report Status 11/01/2024 FINAL  Final  MRSA Next Gen by PCR, Nasal     Status: Abnormal   Collection Time: 10/27/24  1:38 PM   Specimen: Nasal Mucosa; Nasal Swab  Result Value Ref Range Status   MRSA by PCR Next Gen DETECTED (A) NOT DETECTED Final    Comment: RESULT CALLED TO, READ BACK BY AND VERIFIED WITH: KATHERINE CLAYTON @1520  10/27/24 MJU (NOTE) The GeneXpert MRSA Assay (FDA approved for NASAL specimens only), is one component of a comprehensive MRSA colonization surveillance program. It is not intended to diagnose MRSA infection nor to guide or monitor treatment for MRSA infections. Test performance is not FDA approved in patients less than 74 years old. Performed at [facility], [address] [place], [place] [address]     Coagulation Studies: No results for input(s): LABPROT, INR in the last 72 hours.   Urinalysis: No results for input(s): COLORURINE, LABSPEC, PHURINE, GLUCOSEU, HGBUR, BILIRUBINUR, KETONESUR, PROTEINUR, UROBILINOGEN, NITRITE, LEUKOCYTESUR in the last 72 hours.  Invalid input(s): APPERANCEUR     Imaging: No results found.      Medications:      allopurinol   300 mg Oral Daily   alteplase   2 mg Intracatheter Once   Chlorhexidine  Gluconate Cloth  6 each Topical Nightly   feeding supplement (GLUCERNA SHAKE)  237 mL Oral TID BM   furosemide   40 mg Oral Daily   insulin  aspart  0-15 Units Subcutaneous TID WC   insulin  aspart  0-5 Units Subcutaneous QHS   insulin  aspart  5 Units Subcutaneous TID WC   insulin  glargine  15 Units Subcutaneous QHS   latanoprost   1 drop Both Eyes QHS   levothyroxine   50 mcg Oral QAC breakfast   midodrine   15 mg Oral TID WC   multivitamin with minerals  1 tablet Oral Daily   pantoprazole   40 mg Oral BID   phytonadione   2.5 mg Oral Daily   pneumococcal 20-valent conjugate vaccine   0.5 mL Intramuscular Tomorrow-1000   rosuvastatin   5 mg Oral Daily   sodium chloride  flush  3 mL Intravenous Q12H   acetaminophen , alum & mag hydroxide-simeth, docusate sodium , ipratropium-albuterol , magnesium  hydroxide, ondansetron  (ZOFRAN ) IV, mouth rinse, polyethylene glycol, sodium chloride  flush, traZODone   Assessment/ Plan:  Ms. DAMETRA WHETSEL is a 68 y.o.  female presents to  ED with weakness and has been admitted for Hemorrhagic shock (HCC) [R57.8] Shock (HCC) [R57.9] Acute GI bleeding [K92.2] Acute renal failure, unspecified acute renal failure type [N17.9]   Acute Kidney Injury - Patient has baseline creatinine 0.92 on 08/22/24.  Acute kidney injury appears multifactorial at this time from prior hypotension and dehydration. Potassium 6.1 on admission. Corrected with shifting measures.  Required CRRT from 12/1 to 12/3.  Creatinine remains stable with adequate urine output. No longer requires dialysis. Will follow up with Dr Jimmie in 1-2 weeks.   Lab Results  Component Value Date   CREATININE 1.07 (H) 11/18/2024   CREATININE 1.14 (H) 11/17/2024   CREATININE 1.20 (H) 11/16/2024    Intake/Output Summary (Last 24 hours) at 11/18/2024 1247 Last data filed at 11/18/2024 0333 Gross per 24 hour  Intake 1129.25 ml  Output 2450 ml  Net -1320.75 ml    2. Hypokalemia Likely secondary to aggressive IV diuresis. Potassium slightly elevated, continue to hold spironolactone   3. LE edema Continue Furosemide  40mg  daily, hold spironolactone . Will continue to assess outpatient.    4. Anemia with suspected acute blood loss Lab Results  Component Value Date   HGB 8.5 (L) 11/18/2024  Has received blood transfusions during this admission. GI has no plans to repeat EGD due to current and recent workups.  Hemoglobin stable     LOS: 22 Kacen Mellinger 12/22/202512:47 PM   "

## 2024-11-18 NOTE — Progress Notes (Signed)
 [facility] CARDIOLOGY PROGRESS NOTE   Patient ID: Tammy Arias MRN: [id number] DOB/AGE: 1956-01-30 68 y.o.  Admit date: 10/27/2024 Referring Physician Dr. Juanita Picking Primary Physician Valora Lynwood FALCON, MD  Primary Cardiologist Dr. Florencio Reason for Consultation atrial fibrillation, GI bleed  HPI: Tammy Arias is a 68 y.o. female with a past medical history of coronary artery disease s/p CABG, ischemic cardiomyopathy, chronic HFrEF, history of atrial fibrillation s/p failed Watchman, pulmonary hypertension, hx GI bleed who presented to the ED on 10/27/2024 for bloody drainage from ileostomy. Treated for hemorrhagic shock with improvement in this but with persistent hypotension and fluid retention. Cardiology was consulted for further evaluation.   Interval History:  -Patient seen and examined this AM, sitting upright in hospital bed. -BP remains stable, no complaints of dizziness. Cr stable, legs look better with wraps. -Hemoglobin stable today, received blood transfusion yesterday evening.  No reports of bleeding overnight.  Review of systems complete and found to be negative unless listed above   Vitals:   11/17/24 2329 11/18/24 0340 11/18/24 0417 11/18/24 0717  BP: (!) 93/53  (!) 99/56 (!) 92/48  Pulse: 85  83 72  Resp: 18  17 17   Temp: 98.5 F (36.9 C)  98.4 F (36.9 C) 97.9 F (36.6 C)  TempSrc: Oral  Oral Oral  SpO2: 100%  100% 99%  Weight:  67.5 kg 66.9 kg   Height:         Intake/Output Summary (Last 24 hours) at 11/18/2024 9147 Last data filed at 11/18/2024 9666 Gross per 24 hour  Intake 1369.25 ml  Output 2450 ml  Net -1080.75 ml     PHYSICAL EXAM General: Chronically ill appearing female, well nourished, in no acute distress. HEENT: Normocephalic and atraumatic. Neck: No JVD.  Lungs: Normal respiratory effort on room air. Clear bilaterally to auscultation. No wheezes, crackles, rhonchi.  Heart: irregularly irregular, controlled rate. Normal  S1 and S2 without gallops or murmurs. Radial & DP pulses 2+ bilaterally. Abdomen: Non-distended appearing.  Msk: Normal strength and tone for age. Extremities: No clubbing, cyanosis.  Pitting edema bilaterally. Neuro: Alert and oriented X 3. Psych: Mood appropriate, affect congruent.    LABS: Basic Metabolic Panel: Recent Labs    11/16/24 0455 11/17/24 0612 11/18/24 0604  NA 127* 129* 131*  K 4.7 4.2 4.2  CL 95* 95* 97*  CO2 21* 23 22  GLUCOSE 181* 174* 96  BUN 27* 28* 26*  CREATININE 1.20* 1.14* 1.07*  CALCIUM  8.9 8.9 9.1  MG 2.0  --  1.8  PHOS 2.3* 3.6 3.6   Liver Function Tests: No results for input(s): AST, ALT, ALKPHOS, BILITOT, PROT, ALBUMIN  in the last 72 hours.   No results for input(s): LIPASE, AMYLASE in the last 72 hours. CBC: Recent Labs    11/17/24 0612 11/18/24 0604  HGB 7.1* 8.5*   Cardiac Enzymes: No results for input(s): CKTOTAL, CKMB, CKMBINDEX, TROPONINIHS in the last 72 hours. BNP: No results for input(s): BNP in the last 72 hours. D-Dimer: No results for input(s): DDIMER in the last 72 hours. Hemoglobin A1C: No results for input(s): HGBA1C in the last 72 hours. Fasting Lipid Panel: No results for input(s): CHOL, HDL, LDLCALC, TRIG, CHOLHDL, LDLDIRECT in the last 72 hours. Thyroid  Function Tests: No results for input(s): TSH, T4TOTAL, T3FREE, THYROIDAB in the last 72 hours.  Invalid input(s): FREET3  Anemia Panel: No results for input(s): VITAMINB12, FOLATE, FERRITIN, TIBC, IRON , RETICCTPCT in the last 72 hours.  No results found.  ECHO 07/2024: 1. Left ventricular ejection fraction, by estimation, is 35 to 40%. The left ventricle has moderately decreased function. The left ventricle demonstrates global hypokinesis. Left ventricular diastolic parameters are indeterminate.   2. Mildly D-shaped interventricular septum suggestive of RV pressure/volume overload. Peak RV-RA  gradient 36 mmHg. Right ventricular systolic function is moderately reduced. The right ventricular size is severely enlarged.   3. Left atrial size was severely dilated.   4. Right atrial size was severely dilated.   5. S/p mitral valve repair via mTEER with 1 Mitraclip, position appears  A2/P2. Mean gradient 5 mmHg. Moderate residual mitral regurgitation.   6. The tricuspid valve is abnormal. Tricuspid valve regurgitation is severe.   7. The aortic valve is tricuspid. There is mild calcification of the aortic valve. Aortic valve regurgitation is not visualized. No aortic stenosis is present.   8. The IVC was not visualized.   9. The patient was in atrial fibrillation.   TELEMETRY (personally reviewed): not on tele  EKG (personally reviewed): Atrial fibrillation low voltage nonspecific ST-T wave changes 78 bpm  DATA reviewed by me 11/18/2024: last 24h vitals tele labs imaging I/O, hospitalist progress note, nephrology notes, surgery notes  Principal Problem:   Hemorrhagic shock (HCC) Active Problems:   Atrial fibrillation, chronic (HCC)   Type 2 diabetes mellitus (HCC)   OSA on CPAP   CAD with hx of CABG   Cirrhosis and Portal hypertension  on CT(HCC)   Acute on chronic HFrEF (heart failure with reduced ejection fraction) (HCC)   Hypothyroidism   Acute renal failure   Ileostomy in place [facility])   Esophageal varices without bleeding (HCC)   Protein calorie malnutrition   Shock (HCC)   Acute GI bleeding   Chronic kidney disease, stage 3b (HCC)   Leg edema   Malnutrition of moderate degree    ASSESSMENT AND PLAN: Tammy Arias is a 68 y.o. female with a past medical history of coronary artery disease s/p CABG, ischemic cardiomyopathy, chronic HFrEF, history of atrial fibrillation s/p failed Watchman, pulmonary hypertension, hx GI bleed who presented to the ED on 10/27/2024 for bloody drainage from ileostomy. Treated for hemorrhagic shock with improvement in this but with persistent  hypotension and fluid retention. Cardiology was consulted for further evaluation.   # Acute on chronic HFrEF # GI bleed, hemorrhagic shock # Coronary artery disease # Chronic atrial fibrillation -Diuresis as per nephrology. Appreciate their recommendations.  -BP unable to tolerate further GDMT. Continue spironolactone  25 mg twice daily, carvedilol  held. Can consider SGLT2i.  -Midodrine  per primary team. Weaned off pressors.  -Recommend Hgb goal >8 given underlying cardiac issues.  General surgery now following for additional evaluation of continued bleeding.  Appreciate their recommendations. -Given comorbidities, inability to tolerate anticoagulation, patient would likely not be a candidate for advanced therapies.   Ok for discharge today from a cardiac perspective. Will arrange for follow up in clinic with Dr. Florencio in 1-2 weeks.    This patient's case was discussed and created with Dr. Florencio and he is in agreement.  Signed:  Danita Bloch, PA-C  11/18/2024, 8:52 AM [facility]

## 2024-11-18 NOTE — Discharge Summary (Signed)
 " Physician Discharge Summary   Patient: Tammy Arias MRN: [id number] DOB: Apr 05, 1956  Admit date:     10/27/2024  Discharge date: {dischdate:26783}  Discharge Physician: Leita Blanch   PCP: Valora Lynwood FALCON, MD   Recommendations at discharge:  {Tip this will not be part of the note when signed- Example include specific recommendations for outpatient follow-up, pending tests to follow-up on. (Optional):26781}  ***  Discharge Diagnoses: Principal Problem:   Hemorrhagic shock (HCC) Active Problems:   Acute renal failure   Cirrhosis and Portal hypertension  on CT(HCC)   CAD with hx of CABG   Hypothyroidism   Acute on chronic HFrEF (heart failure with reduced ejection fraction) (HCC)   Atrial fibrillation, chronic (HCC)   Ileostomy in place (HCC)   OSA on CPAP   Type 2 diabetes mellitus (HCC)   Esophageal varices without bleeding (HCC)   Protein calorie malnutrition   Shock (HCC)   Acute GI bleeding   Chronic kidney disease, stage 3b (HCC)   Leg edema   Malnutrition of moderate degree  Resolved Problems:   * No resolved hospital problems. [facility] Course: No notes on file  Assessment and Plan: No notes have been filed under this hospital service. Service: Hospitalist     {Tip this will not be part of the note when signed Body mass index is 27.87 kg/m. ,  Nutrition Documentation    Flowsheet Row ED to Hosp-Admission (Current) from 10/27/2024 in [facility] 1C MEDICAL TELEMETRY  Nutrition Problem Moderate Malnutrition  Etiology chronic illness  Nutrition Goal Patient will meet greater than or equal to 90% of their needs  ,  (Optional):26781}  {(NOTE) Pain control PDMP Statment (Optional):26782} Consultants: *** Procedures performed: ***  Disposition: {Plan; Disposition:26390} Diet recommendation:  {Diet_Plan:26776} DISCHARGE MEDICATION: Allergies as of 11/18/2024       Reactions   Vioxx [rofecoxib] Swelling        Medication  List     PAUSE taking these medications    aspirin  EC 81 MG tablet Wait to take this until your doctor or other care provider tells you to start again. Take 81 mg by mouth daily. Swallow whole.       STOP taking these medications    carvedilol  3.125 MG tablet Commonly known as: COREG    spironolactone  25 MG tablet Commonly known as: ALDACTONE    torsemide  20 MG tablet Commonly known as: DEMADEX        TAKE these medications    Accu-Chek Guide Me w/Device Kit   albuterol  108 (90 Base) MCG/ACT inhaler Commonly known as: VENTOLIN  HFA Inhale 2 puffs into the lungs every 6 (six) hours as needed for wheezing or shortness of breath.   allopurinol  300 MG tablet Commonly known as: ZYLOPRIM  Take 300 mg by mouth daily.   cyanocobalamin  1000 MCG tablet Commonly known as: VITAMIN B12 Take 1,000 mcg by mouth daily.   empagliflozin  10 MG Tabs tablet Commonly known as: JARDIANCE  Take 1 tablet by mouth daily.   feeding supplement (GLUCERNA SHAKE) Liqd Take 237 mLs by mouth 3 (three) times daily between meals.   Ferrex 150 150 MG capsule Generic drug: iron  polysaccharides Take 1 capsule (150 mg total) by mouth daily.   fluticasone  50 MCG/ACT nasal spray Commonly known as: FLONASE  Place 2 sprays into the nose daily.   furosemide  40 MG tablet Commonly known as: LASIX  Take 1 tablet (40 mg total) by mouth daily.   latanoprost  0.005 % ophthalmic solution Commonly known as: XALATAN   Place 1 drop into both eyes at bedtime.   levothyroxine  50 MCG tablet Commonly known as: SYNTHROID  Take 50 mcg by mouth daily before breakfast.   magnesium  oxide 400 MG tablet Commonly known as: MAG-OX Take 400 mg by mouth. Take 1 tablet (400 mg total) by mouth once daily   metFORMIN  1000 MG tablet Commonly known as: GLUCOPHAGE  Take 1,000 mg by mouth 2 (two) times daily with a meal.   midodrine  5 MG tablet Commonly known as: PROAMATINE  Take 3 tablets (15 mg total) by mouth 3 (three)  times daily with meals.   multivitamin with minerals Tabs tablet Take 1 tablet by mouth daily.   nadolol 20 MG tablet Commonly known as: CORGARD Take 20 mg by mouth daily.   pantoprazole  40 MG tablet Commonly known as: PROTONIX  Take 1 tablet (40 mg total) by mouth 2 (two) times daily. What changed: when to take this   phytonadione  5 MG tablet Commonly known as: VITAMIN K Take 0.5 tablets (2.5 mg total) by mouth daily.   rosuvastatin  5 MG tablet Commonly known as: CRESTOR  Take 5 mg by mouth daily.        Follow-up Information     Florencio Cara BIRCH, MD. Go in 1 week(s).   Specialties: Cardiology, Internal Medicine Why: Appointment scheduled for 12/26/25n at 10:15 AM Contact information: [address] [place] [address] [phone]         [facility]. Go in 1 week(s).   Why: [facility] on 12/03/2024 12 PM Contact information: [address] [place], [place] [address] [phone]        Valora Lynwood FALCON, MD. Schedule an appointment as soon as possible for a visit in 1 week(s).   Specialty: Family Medicine Contact information: [address] [facility] [place] [address] [phone]         Dennise Capri, MD. Schedule an appointment as soon as possible for a visit in 10 day(s).   Specialty: Nephrology Why: leg edema Contact information: [address] [place] [address] [phone]                Discharge Exam: [name]   11/17/24 0500 11/18/24 0340 11/18/24 0417  Weight: 64.8 kg 67.5 kg 66.9 kg   ***  Condition at discharge: {DC Condition:26389}  The results of significant diagnostics from this hospitalization (including imaging, microbiology, ancillary and laboratory) are listed below for reference.   Imaging Studies: CT ANGIO GI BLEED Result Date: 11/14/2024 CLINICAL DATA:  GI bleed. EXAM: CTA ABDOMEN AND PELVIS  WITHOUT AND WITH CONTRAST TECHNIQUE: Multidetector CT imaging of the abdomen and pelvis was performed using the standard protocol during bolus administration of intravenous contrast. Multiplanar reconstructed images and MIPs were obtained and reviewed to evaluate the vascular anatomy. RADIATION DOSE REDUCTION: This exam was performed according to the departmental dose-optimization program which includes automated exposure control, adjustment of the mA and/or kV according to patient size and/or use of iterative reconstruction technique. CONTRAST:  OMNIPAQUE  IOHEXOL  350 MG/ML SOLN COMPARISON:  CT abdomen pelvis dated 10/28/2024. FINDINGS: VASCULAR Aorta: Moderate atherosclerotic calcification of the aorta. No abnormal dilatation or dissection. No periaortic fluid collection. Celiac: Patent without evidence of aneurysm, dissection, vasculitis or significant stenosis. SMA: Patent without evidence of aneurysm, dissection, vasculitis or significant stenosis. Renals: Atherosclerotic calcification of the origins of the renal arteries. There is overall decreased renal artery lumen with decreased flow. The renal arteries remain patent. IMA:  Faint contrast noted in the proximal RCA. Inflow: Mild atherosclerotic calcification. No aneurysmal dilatation or dissection. The iliac arteries are patent. Proximal Outflow: The visualized proximal pleural patent. Veins: The IVC is unremarkable. The SMV, splenic vein, and main portal vein are patent. No portal venous gas. Review of the MIP images confirms the above findings. NON-VASCULAR Lower chest: The visualized lung bases are clear. There is cardiomegaly. No intra-abdominal free air.  Small ascites. Hepatobiliary: Cirrhosis. Gallstones. The gallbladder is contracted. Pancreas: There is stranding adjacent to the pancreas which may be related to ascites. Correlation with pancreatic enzymes recommended to exclude acute pancreatitis. Spleen: Borderline splenomegaly measuring 14 cm  in length. Adrenals/Urinary Tract: The adrenal glands unremarkable. Mild bilateral renal parenchyma atrophy. There is no hydronephrosis or nephrolithiasis on either side. Subcentimeter hyperdense renal lesions are too small to characterize. The visualized ureters and urinary bladder appear unremarkable. Stomach/Bowel: Postsurgical changes of colectomy with right lower ileostomy. Several thick appearing loop of small bowel in the lower abdomen may be related to diffuse mesenteric edema and ascites or represent enteritis. Clinical correlation is recommended. No bowel obstruction. No evidence of active GI bleed. Lymphatic: No adenopathy. Reproductive: The uterus is grossly unremarkable. Other: Midline vertical anterior pelvic wall incisional scar. Diffuse mesenteric and subcutaneous edema and anasarca. Small ventral supraumbilical fat containing hernia. Musculoskeletal: Degenerative changes of the spine. No acute osseous pathology. IMPRESSION: 1. No evidence of active GI bleed. 2. Postsurgical changes of colectomy with right lower ileostomy. 3. Cirrhosis with small ascites and borderline splenomegaly. 4. Cholelithiasis. 5.  Aortic Atherosclerosis (ICD10-I70.0). Electronically Signed   By: Vanetta Chou M.D.   On: 11/14/2024 20:17   DG Chest Port 1 View Result Date: 10/31/2024 EXAM: 1 VIEW(S) XRAY OF THE CHEST 10/31/2024 10:42:05 AM COMPARISON: 10/27/2024 CLINICAL HISTORY: Wheezing. FINDINGS: LINES, TUBES AND DEVICES: Right internal jugular central line remains in place, unchanged. LUNGS AND PLEURA: Minimal left basilar linear densities most compatible with atelectasis. No pleural effusion. No pneumothorax. HEART AND MEDIASTINUM: Mild cardiomegaly, stable. BONES AND SOFT TISSUES: Prior median sternotomy. No acute osseous abnormality. IMPRESSION: 1. Left base atelectasis. 2. Mild cardiomegaly, stable. Electronically signed by: Franky Crease MD 10/31/2024 02:32 PM EST RP Workstation: [id number]   CT ABDOMEN PELVIS  WO CONTRAST Result Date: 10/28/2024 CLINICAL DATA:  Sepsis. EXAM: CT ABDOMEN AND PELVIS WITHOUT CONTRAST TECHNIQUE: Multidetector CT imaging of the abdomen and pelvis was performed following the standard protocol without IV contrast. RADIATION DOSE REDUCTION: This exam was performed according to the departmental dose-optimization program which includes automated exposure control, adjustment of the mA and/or kV according to patient size and/or use of iterative reconstruction technique. COMPARISON:  08/21/2024 FINDINGS: Lower chest: Trace bilateral pleural effusions. Hepatobiliary: Gallstones. No significant gallbladder distension but limited evaluation of the gallbladder. No acute abnormality involving the liver. Small amount of perihepatic ascites. Pancreas: Unremarkable. No pancreatic ductal dilatation or surrounding inflammatory changes. Spleen: Normal in size without focal abnormality. Adrenals/Urinary Tract: Adrenal glands within normal limits. Again noted are small hyperdense structures in the kidneys are too small to definitively characterize. No definite stones or hydronephrosis. Small amount of fluid in the urinary bladder. Stomach/Bowel: History of colectomy with a Hartmann's pouch. Normal appearance of the stomach. No acute abnormality involving the ileostomy. Limited evaluation of the small bowel structures due to the minimal oral contrast. Difficult to exclude areas of small bowel wall thickening in the left abdomen. Mild mesenteric edema. Vascular/Lymphatic: Atherosclerotic calcifications in the abdominal aorta without aneurysm. Central line in the right common femoral  vein that terminates near the junction of the IVC and right common iliac vein. No significant lymph node enlargement in the abdomen or pelvis. Reproductive: Limited evaluation but no gross abnormality to the uterus or adnexal structures. Other: Again noted is free fluid in the pelvis and small amount of upper abdominal ascites. Small  amount of free fluid in left lower quadrant. Diffuse subcutaneous edema. Right paracentral ventral hernia in the upper abdomen contains a small amount of fluid and similar to the previous examination. Musculoskeletal: Scoliosis in the thoracolumbar spine. No acute bone abnormality. IMPRESSION: 1. Small amount of ascites in the abdomen and pelvis and similar to the exam on 08/21/2024. 2. Trace bilateral pleural effusions. 3. Cholelithiasis.  Limited evaluation of the gallbladder. 4. Small hyperdense renal structures that are too small to definitively characterize and recommend attention on follow-up. 5. Limited evaluation of the bowel structures with an ileostomy. No evidence for a bowel obstruction. 6. Aortic Atherosclerosis (ICD10-I70.0). Electronically Signed   By: Juliene Balder M.D.   On: 10/28/2024 15:22   DG Chest Port 1 View Result Date: 10/27/2024 CLINICAL DATA:  Central line EXAM: PORTABLE CHEST 1 VIEW COMPARISON:  Chest x-ray 10/27/2024 FINDINGS: There is a new right-sided central venous catheter with distal tip in the proximal right atrium. The heart is enlarged. Patient is status post cardiac surgery. The lungs are clear. There is no pleural effusion or pneumothorax. No acute fractures are seen. IMPRESSION: New right-sided central venous catheter with distal tip in the proximal right atrium. No pneumothorax. Electronically Signed   By: Greig Pique M.D.   On: 10/27/2024 16:54   DG Chest Port 1 View Result Date: 10/27/2024 CLINICAL DATA:  Sepsis.  GI bleeding.  Hypotension. EXAM: PORTABLE CHEST 1 VIEW COMPARISON:  08/15/2024 FINDINGS: Stable mild cardiomegaly. Prior median sternotomy. Both lungs are clear. IMPRESSION: Mild cardiomegaly. No active lung disease. Electronically Signed   By: Norleen DELENA Kil M.D.   On: 10/27/2024 11:49    Microbiology: Results for orders placed or performed during the hospital encounter of 10/27/24  Resp panel by RT-PCR (RSV, Flu A&B, Covid) Anterior Nasal Swab      Status: None   Collection Time: 10/27/24 11:01 AM   Specimen: Anterior Nasal Swab  Result Value Ref Range Status   SARS Coronavirus 2 by RT PCR NEGATIVE NEGATIVE Final    Comment: (NOTE) SARS-CoV-2 target nucleic acids are NOT DETECTED.  The SARS-CoV-2 RNA is generally detectable in upper respiratory specimens during the acute phase of infection. The lowest concentration of SARS-CoV-2 viral copies this assay can detect is 138 copies/mL. A negative result does not preclude SARS-Cov-2 infection and should not be used as the sole basis for treatment or other patient management decisions. A negative result may occur with  improper specimen collection/handling, submission of specimen other than nasopharyngeal swab, presence of viral mutation(s) within the areas targeted by this assay, and inadequate number of viral copies(<138 copies/mL). A negative result must be combined with clinical observations, patient history, and epidemiological information. The expected result is Negative.  Fact Sheet for Patients:  bloggercourse.com  Fact Sheet for Healthcare Providers:  seriousbroker.it  This test is no t yet approved or cleared by the [place]  FDA and  has been authorized for detection and/or diagnosis of SARS-CoV-2 by FDA under an Emergency Use Authorization (EUA). This EUA will remain  in effect (meaning this test can be used) for the duration of the COVID-19 declaration under Section 564(b)(1) of the Act, 21 U.S.C.section 360bbb-3(b)(1), unless  the authorization is terminated  or revoked sooner.       Influenza A by PCR NEGATIVE NEGATIVE Final   Influenza B by PCR NEGATIVE NEGATIVE Final    Comment: (NOTE) The Xpert Xpress SARS-CoV-2/FLU/RSV plus assay is intended as an aid in the diagnosis of influenza from Nasopharyngeal swab specimens and should not be used as a sole basis for treatment. Nasal washings and aspirates are  unacceptable for Xpert Xpress SARS-CoV-2/FLU/RSV testing.  Fact Sheet for Patients: bloggercourse.com  Fact Sheet for Healthcare Providers: seriousbroker.it  This test is not yet approved or cleared by the [place]  FDA and has been authorized for detection and/or diagnosis of SARS-CoV-2 by FDA under an Emergency Use Authorization (EUA). This EUA will remain in effect (meaning this test can be used) for the duration of the COVID-19 declaration under Section 564(b)(1) of the Act, 21 U.S.C. section 360bbb-3(b)(1), unless the authorization is terminated or revoked.     Resp Syncytial Virus by PCR NEGATIVE NEGATIVE Final    Comment: (NOTE) Fact Sheet for Patients: bloggercourse.com  Fact Sheet for Healthcare Providers: seriousbroker.it  This test is not yet approved or cleared by the [place]  FDA and has been authorized for detection and/or diagnosis of SARS-CoV-2 by FDA under an Emergency Use Authorization (EUA). This EUA will remain in effect (meaning this test can be used) for the duration of the COVID-19 declaration under Section 564(b)(1) of the Act, 21 U.S.C. section 360bbb-3(b)(1), unless the authorization is terminated or revoked.  Performed at [facility], [address] [place], [place] [address]   Blood Culture (routine x 2)     Status: None   Collection Time: 10/27/24 12:18 PM   Specimen: Right Antecubital; Blood  Result Value Ref Range Status   Specimen Description RIGHT ANTECUBITAL  Final   Special Requests   Final    BOTTLES DRAWN AEROBIC AND ANAEROBIC Blood Culture adequate volume   Culture   Final    NO GROWTH 5 DAYS Performed at [facility], [address] [place], [place] [address]    Report Status 11/01/2024 FINAL  Final  Blood Culture (routine x 2)     Status: None   Collection Time: 10/27/24 12:18 PM   Specimen: Left  Antecubital; Blood  Result Value Ref Range Status   Specimen Description LEFT ANTECUBITAL  Final   Special Requests   Final    BOTTLES DRAWN AEROBIC AND ANAEROBIC Blood Culture results may not be optimal due to an inadequate volume of blood received in culture bottles   Culture   Final    NO GROWTH 5 DAYS Performed at [facility], [address], [place], [place] [address]    Report Status 11/01/2024 FINAL  Final  MRSA Next Gen by PCR, Nasal     Status: Abnormal   Collection Time: 10/27/24  1:38 PM   Specimen: Nasal Mucosa; Nasal Swab  Result Value Ref Range Status   MRSA by PCR Next Gen DETECTED (A) NOT DETECTED Final    Comment: RESULT CALLED TO, READ BACK BY AND VERIFIED WITH: KATHERINE CLAYTON @1520  10/27/24 MJU (NOTE) The GeneXpert MRSA Assay (FDA approved for NASAL specimens only), is one component of a comprehensive MRSA colonization surveillance program. It is not intended to diagnose MRSA infection nor to guide or monitor treatment for MRSA infections. Test performance is not FDA approved in patients less than 15 years old. Performed at [facility], [address], [place], [place] [address]  Labs: CBC: Recent Labs  Lab 11/11/24 1643 11/11/24 2230 11/12/24 0810 11/12/24 1221 11/13/24 0446 11/14/24 0524 11/15/24 0411 11/17/24 0612 11/18/24 0604  WBC  --   --  3.4*  --   --   --   --   --   --   HGB 9.1* 7.9* 7.9* 6.9* 7.6* 6.6* 7.5* 7.1* 8.5*  HCT 28.0* 24.9* 23.7* 20.4* 23.3*  --   --   --   --   MCV  --   --  97.5  --   --   --   --   --   --   PLT  --   --  80*  --   --   --   --   --   --    Basic Metabolic Panel: Recent Labs  Lab 11/12/24 0412 11/13/24 0446 11/14/24 0524 11/15/24 0411 11/16/24 0455 11/17/24 0612 11/18/24 0604  NA 133*   < > 130* 128* 127* 129* 131*  K 3.4*   < > 5.3* 5.2* 4.7 4.2 4.2  CL 93*   < > 94* 93* 95* 95* 97*  CO2 31   < > 28 25 21* 23 22  GLUCOSE 119*   < > 150* 198* 181* 174* 96  BUN 41*   <  > 35* 32* 27* 28* 26*  CREATININE 1.18*   < > 1.28* 1.24* 1.20* 1.14* 1.07*  CALCIUM  8.7*   < > 8.8* 8.9 8.9 8.9 9.1  MG 1.8  --  1.7  --  2.0  --  1.8  PHOS 3.3  --   --   --  2.3* 3.6 3.6   < > = values in this interval not displayed.   Liver Function Tests: Recent Labs  Lab 11/12/24 0412  ALBUMIN  3.2*   CBG: Recent Labs  Lab 11/17/24 0802 11/17/24 1223 11/17/24 1706 11/17/24 1935 11/18/24 0719  GLUCAP 136* 156* 184* 214* 103*    Discharge time spent: {LESS THAN/GREATER UYJW:73611} 30 minutes.  Signed: Leita Blanch, MD [facility] 11/18/2024 "

## 2024-11-18 NOTE — Discharge Instructions (Signed)
 Ace wrap daily Keep legs elevated when resting

## 2024-11-18 NOTE — Consult Note (Signed)
 PHARMACY CONSULT NOTE - ELECTROLYTES  Pharmacy Consult for Electrolyte Monitoring and Replacement   Recent Labs: Potassium (mmol/L)  Date Value  11/18/2024 4.2   Magnesium  (mg/dL)  Date Value  87/77/7974 1.8   Calcium  (mg/dL)  Date Value  87/77/7974 9.1   Albumin  (g/dL)  Date Value  87/83/7974 3.2 (L)  05/18/2020 4.7   Phosphorus (mg/dL)  Date Value  87/77/7974 3.6   Sodium (mmol/L)  Date Value  11/18/2024 131 (L)   Height: 5' 1 (154.9 cm) Weight: 66.9 kg (147 lb 7.8 oz) IBW/kg (Calculated) : 47.8 Estimated Creatinine Clearance: 43.4 mL/min (A) (by C-G formula based on SCr of 1.07 mg/dL (H)).  Assessment  Tammy Arias is a 68 y.o. female presenting with acute HFrEF. PMH significant for CHF, Afib, GI bleeding, CABG, pHTN, liver cirrhosis, C diff s/p colectomy and end ileostomy. Pharmacy has been consulted to monitor and replace electrolytes.  Diet: Carb modified MIVF: N/A Pertinent medications: Lasix  40 mg po Spironolactone  25mg  discontinued due to hyperK, hypoNa  Goal of Therapy: Electrolytes within normal limits  Plan:  No electrolyte replacement at this time Electrolytes with AM labs  Thank you for allowing pharmacy to be a part of this patients care.  Will M. Lenon, PharmD, BCPS Clinical Pharmacist 11/18/2024 8:08 AM

## 2024-11-18 NOTE — TOC Transition Note (Signed)
 Transition of Care [facility]) - Discharge Note   Patient Details  Name: Tammy Arias MRN: [id number] Date of Birth: [birth year]  Transition of Care [facility]) CM/SW Contact:  Victory Jackquline RAMAN, RN Phone Number: 11/18/2024, 10:16 AM   Clinical Narrative: Patient discharging home today with [facility] home health. Son to provide transportation. No further concerns. RNCM signing off.    Final next level of care: Home w Home Health Services Barriers to Discharge: Barriers Resolved   Patient Goals and CMS Choice            Discharge Placement                Patient to be transferred to facility by: Son Name of family member notified: Lemond Patient and family notified of of transfer: 11/18/24  Discharge Plan and Services Additional resources added to the After Visit Summary for                              [facility] Agency: [facility]        Social Drivers of Health (SDOH) Interventions SDOH Screenings   Food Insecurity: No Food Insecurity (10/29/2024)  Housing: Low Risk (10/29/2024)  Transportation Needs: No Transportation Needs (10/29/2024)  Utilities: Not At Risk (10/29/2024)  Depression (PHQ2-9): Low Risk (09/04/2024)  Financial Resource Strain: Medium Risk (05/20/2024)   Received from [facility]  Social Connections: Moderately Integrated (10/29/2024)  Tobacco Use: Medium Risk (10/27/2024)     Readmission Risk Interventions    10/28/2024    3:01 PM 08/16/2024    1:36 PM 06/11/2024    4:23 PM  Readmission Risk Prevention Plan  Transportation Screening Complete Complete Complete  Medication Review Oceanographer) Complete Complete Complete  PCP or Specialist appointment within 3-5 days of discharge Complete Complete   HRI or Home Care Consult   --  SW Recovery Care/Counseling Consult Complete Complete   Palliative Care Screening Not Applicable Not Applicable Not Applicable  Skilled Nursing Facility Not Applicable Not Applicable Not  Applicable

## 2024-11-19 ENCOUNTER — Encounter: Payer: Self-pay | Admitting: Oncology

## 2024-11-20 LAB — BPAM RBC
Blood Product Expiration Date: 202601072359
Blood Product Expiration Date: 202601202359
ISSUE DATE / TIME: 202512212050
Unit Type and Rh: 600
Unit Type and Rh: 9500

## 2024-11-20 LAB — TYPE AND SCREEN
ABO/RH(D): A NEG
Antibody Screen: POSITIVE
Unit division: 0
Unit division: 0

## 2024-11-27 ENCOUNTER — Inpatient Hospital Stay: Attending: Oncology

## 2024-11-27 ENCOUNTER — Encounter: Payer: Self-pay | Admitting: Oncology

## 2024-11-27 ENCOUNTER — Inpatient Hospital Stay: Admitting: Oncology

## 2024-11-27 VITALS — BP 108/46 | HR 68 | Temp 97.4°F | Resp 18 | Ht 61.0 in | Wt 137.8 lb

## 2024-11-27 DIAGNOSIS — D500 Iron deficiency anemia secondary to blood loss (chronic): Secondary | ICD-10-CM

## 2024-11-27 DIAGNOSIS — M810 Age-related osteoporosis without current pathological fracture: Secondary | ICD-10-CM | POA: Insufficient documentation

## 2024-11-27 DIAGNOSIS — D509 Iron deficiency anemia, unspecified: Secondary | ICD-10-CM | POA: Diagnosis present

## 2024-11-27 LAB — CBC (CANCER CENTER ONLY)
HCT: 24.1 % — ABNORMAL LOW (ref 36.0–46.0)
Hemoglobin: 7.8 g/dL — ABNORMAL LOW (ref 12.0–15.0)
MCH: 33.5 pg (ref 26.0–34.0)
MCHC: 32.4 g/dL (ref 30.0–36.0)
MCV: 103.4 fL — ABNORMAL HIGH (ref 80.0–100.0)
Platelet Count: 99 K/uL — ABNORMAL LOW (ref 150–400)
RBC: 2.33 MIL/uL — ABNORMAL LOW (ref 3.87–5.11)
RDW: 26.3 % — ABNORMAL HIGH (ref 11.5–15.5)
WBC Count: 4.3 K/uL (ref 4.0–10.5)
nRBC: 1.2 % — ABNORMAL HIGH (ref 0.0–0.2)

## 2024-11-27 NOTE — Progress Notes (Unsigned)
 Patient has some concerns to address during today's visit.

## 2024-11-28 LAB — IRON AND TIBC
Iron: 176 ug/dL — ABNORMAL HIGH (ref 28–170)
Saturation Ratios: 34 % — ABNORMAL HIGH (ref 10.4–31.8)
TIBC: 524 ug/dL — ABNORMAL HIGH (ref 250–450)
UIBC: 348 ug/dL

## 2024-11-28 LAB — FERRITIN: Ferritin: 440 ng/mL — ABNORMAL HIGH (ref 11–307)

## 2024-11-29 ENCOUNTER — Encounter: Payer: Self-pay | Admitting: Oncology

## 2024-11-29 NOTE — Progress Notes (Signed)
 "    Hematology/Oncology Consult note [facility]  [phone] [phone]  Patient Care Team: Valora Lynwood FALCON, MD as PCP - General (Family Medicine) Melanee Annah BROCKS, MD as Consulting Physician (Oncology) Aundria Ladell POUR, MD as Consulting Physician (Gastroenterology)   Name of the patient: Tammy Arias  [id number]  [birth year]   Date of visit: 11/29/2024  Diagnosis- chronic benign neutropenia Iron  deficiency anemia    Chief complaint/ Reason for visit-routine follow-up of anemia  Heme/Onc history: patient is a 69 year old Caucasian female with a past medical history significant for congestive heart failure with an EF of 35%, history of A. 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 EGD showed salmon-colored mucosa suspicious for short segment Barrett's.  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  Nonbleeding erosive gastropathy.  Normal examined duodenum.  No specimens collected.Colonoscopy showed 7 nonbleeding colonic angiectasia treated with APC.  Nonbleeding internal hemorrhoids.  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  On a couple of occasions her white count has been normal at around 4.1.  Her white count was noted to be 2.4 on her recent blood work and prior to that a month ago it was 2.7.  Differential mainly shows lymphopenia with a neutrophil count that fluctuates between 1.8-3.7.  She is also developed gradual onset thrombocytopenia over the last 2 years and her platelet counts have gradually drifted down from 1 45-1 07 in March 2022.  More recently in April  she was noted to have a platelet count of 98.   Bone marrow biopsy in 2022 showed slightly hypercellular marrow with trilineage-hematopoiesis with abundant megakaryocytes with normal morphology.  Generally mild nonspecific changes involving the myeloid cell lines with no increase in blasts.  Cytogenetics and FISH studies currently pending.  Myeloma panel, B12, folate unremarkable.  ANA comprehensive panel negative.  Interval history-patient was last seen by me in March 2025 and since then she has had recurrent hospitalizations with the most recent 1 in December 2025 when she was admitted for heart failure as well as concern for GI bleeding from ileostomy.  She was in the ICU requiring pressor support.  With regards to bleeding from her colostomy site this was monitored conservatively and GI did not plan any intervention at that time.  She has an upcoming appointment with GI next month.  She required multiple units of blood transfusion during the hospitalization.   Her current hemoglobin is 7.8 g/dL. She continues to experience symptomatic anemia. She has a history of prior bone marrow biopsy in 2022, which was unremarkable. She has previously received iron  infusions, but notes that every time she gave me an infusion, it bled out again. She is scheduled to see gastroenterology on January 5th for further evaluation of gastrointestinal bleeding and ostomy-related issues.  She continues to have persistent fluid overload, which limits her mobility and ability to drive, requiring assistance for transportation. She expresses concern about the risk of worsening fluid overload with additional transfusions.       History of Present Illness  ECOG PS- 2 Pain scale- 3  Review of systems- Review of Systems  Constitutional:  Positive  for malaise/fatigue. Negative for chills, fever and weight loss.  HENT:  Negative for congestion, ear discharge and nosebleeds.   Eyes:  Negative for blurred vision.   Respiratory:  Negative for cough, hemoptysis, sputum production, shortness of breath and wheezing.   Cardiovascular:  Negative for chest pain, palpitations, orthopnea and claudication.  Gastrointestinal:  Negative for abdominal pain, blood in stool, constipation, diarrhea, heartburn, melena, nausea and vomiting.  Genitourinary:  Negative for dysuria, flank pain, frequency, hematuria and urgency.  Musculoskeletal:  Negative for back pain, joint pain and myalgias.  Skin:  Negative for rash.  Neurological:  Negative for dizziness, tingling, focal weakness, seizures, weakness and headaches.  Endo/Heme/Allergies:  Does not bruise/bleed easily.  Psychiatric/Behavioral:  Negative for depression and suicidal ideas. The patient does not have insomnia.       Allergies[1]   Past Medical History:  Diagnosis Date   AC (acromioclavicular) joint bone spurs, right    Anemia    Arthritis    Atrial fibrillation (HCC)    a.) CHA2DS2-VASc = 7 (age, sex, HFrEF, HTN, TIA x 2, T2DM). b.) rate/rhythm controlled on oral carvedilol ; chronically anticoagulated with warfarin.   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 b.) R/LHC 03/29/2017: small RCA with occluded SVG; no significant Dz. Chronically occluded LAD with patent SVG to D1/LAD. Insignificant Dz in LCx. LM normal.   Cortical cataract    DOE (dyspnea on exertion)    DOE (dyspnea on exertion)    GERD (gastroesophageal reflux disease)    HFrEF (heart failure with reduced ejection fraction) (HCC)    a.) TTE 10/27/2014: EF 40%; glob HK; mild BAE; triv AR, mild MR/PR, mod TR.  b.) TTE 03/15/2017: EF 30%; glob HK; mod BAE; mod pHTN (RVSP 54.8 mmHg); mild PR, mod MR; sev TR.  c.) R/LHC 03/29/2017: EF 30-35%. d.)TTE 10/25/2018: EF 35%; mod BAE; mod BVE; glob HK;  triv PR, mod MR, sev TR; RVSP 57.7 mmHg; G2DD.   History of shingles 2004   Hyperlipidemia    Hypertension    Hypothyroidism    IBS (irritable bowel syndrome)    Ischemic cardiomyopathy    a.) TTE 10/27/2014: EF 40%. b.) TTE 03/15/2017: EF 30%. c.) R/LHC 04/01/2017: EF 30-35%. d.) TTE 10/25/2018: EF 35%   Lower GI bleed 06/07/2023   Lumbar scoliosis    Lumbar spinal stenosis    Melena 12/17/2018   Migraines    Osteoporosis    Pancolitis 02/28/2024   Pulmonary HTN (HCC)    a.) TTE 03/15/2017: EF 30-35%; RVSP 54.8 mmHg. 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   Sleep apnea    T2DM (type 2 diabetes mellitus) (HCC)    TIA (transient ischemic attack) 05/29/2016   Vitamin B 12 deficiency      Past Surgical History:  Procedure Laterality Date   CARDIAC CATHETERIZATION     CATARACT EXTRACTION W/ INTRAOCULAR LENS IMPLANT Bilateral    Cataract Extraction with IOL   COLECTOMY  02/28/2024   Procedure: COLECTOMY, TOTAL;  Surgeon: Lane Shope, MD;  Location: [facility] ORS;  Service: General;;   COLONOSCOPY N/A 12/19/2018   Procedure: COLONOSCOPY;  Surgeon:  Janalyn Keene NOVAK, MD;  Location: [facility] ENDOSCOPY;  Service: Endoscopy;  Laterality: N/A;   COLONOSCOPY WITH PROPOFOL  N/A 06/17/2020   Procedure: COLONOSCOPY WITH PROPOFOL ;  Surgeon: Janalyn Keene NOVAK, MD;  Location: [facility] ENDOSCOPY;  Service: Endoscopy;  Laterality: N/A;   COLONOSCOPY WITH PROPOFOL  N/A 03/30/2023   Procedure: COLONOSCOPY WITH PROPOFOL ;  Surgeon: Onita Elspeth Sharper, DO;  Location: [facility] ENDOSCOPY;  Service: Endoscopy;  Laterality: N/A;   COR TRIATRIATUM REPAIR N/A 01/28/2004   CORONARY ARTERY BYPASS GRAFT N/A 01/28/2004   3v CABG   ESOPHAGOGASTRODUODENOSCOPY N/A 12/19/2018   Procedure: ESOPHAGOGASTRODUODENOSCOPY (EGD);  Surgeon: Janalyn Keene NOVAK, MD;  Location: [facility] ENDOSCOPY;  Service: Endoscopy;   Laterality: N/A;   ESOPHAGOGASTRODUODENOSCOPY N/A 06/11/2024   Procedure: EGD (ESOPHAGOGASTRODUODENOSCOPY);  Surgeon: Jinny Carmine, MD;  Location: [facility] ENDOSCOPY;  Service: Endoscopy;  Laterality: N/A;   ESOPHAGOGASTRODUODENOSCOPY N/A 07/18/2024   Procedure: EGD (ESOPHAGOGASTRODUODENOSCOPY);  Surgeon: Onita Elspeth Sharper, DO;  Location: [facility] ENDOSCOPY;  Service: Gastroenterology;  Laterality: N/A;  with Enteroscopy        DM on Plavix   ESOPHAGOGASTRODUODENOSCOPY (EGD) WITH PROPOFOL  N/A 06/17/2020   Procedure: ESOPHAGOGASTRODUODENOSCOPY (EGD) WITH PROPOFOL ;  Surgeon: Janalyn Keene NOVAK, MD;  Location: [facility] ENDOSCOPY;  Service: Endoscopy;  Laterality: N/A;   ESOPHAGOGASTRODUODENOSCOPY (EGD) WITH PROPOFOL  N/A 03/30/2023   Procedure: ESOPHAGOGASTRODUODENOSCOPY (EGD) WITH PROPOFOL ;  Surgeon: Onita Elspeth Sharper, DO;  Location: [facility] ENDOSCOPY;  Service: Endoscopy;  Laterality: N/A;   GIVENS CAPSULE STUDY N/A 07/21/2024   Procedure: IMAGING PROCEDURE, GI TRACT, INTRALUMINAL, VIA CAPSULE;  Surgeon: Therisa Bi, MD;  Location: [facility] ENDOSCOPY;  Service: Gastroenterology;  Laterality: N/A;   LAPAROTOMY N/A 02/28/2024   Procedure: LAPAROTOMY, EXPLORATORY;  Surgeon: Lane Shope, MD;  Location: [facility] ORS;  Service: General;  Laterality: N/A;   LEFT ATRIAL APPENDAGE OCCLUSION     RIGHT/LEFT HEART CATH AND CORONARY ANGIOGRAPHY Bilateral 03/29/2017   Procedure: Right/Left Heart Cath and Coronary Angiography;  Surgeon: Vinie DELENA Jude, MD;  Location: [facility] INVASIVE CV LAB;  Service: Cardiovascular;  Laterality: Bilateral;   TOTAL KNEE ARTHROPLASTY Right 04/05/2022   Procedure: TOTAL KNEE ARTHROPLASTY;  Surgeon: Kathlynn Sharper, MD;  Location: [facility] ORS;  Service: Orthopedics;  Laterality: Right;   TUBAL LIGATION     VENTRAL HERNIA REPAIR N/A 04/21/2017   Procedure: HERNIA REPAIR VENTRAL ADULT;  Surgeon: Claudene Larinda Bolder, MD;  Location: [facility] ORS;  Service: General;  Laterality: N/A;    Social History    Socioeconomic History   Marital status: Widowed    Spouse name: Not on file   Number of children: Not on file   Years of education: Not on file   Highest education level: Not on file  Occupational History   Occupation: certified nursing assistant  Tobacco Use   Smoking status: Former    Current packs/day: 0.00    Types: Cigarettes    Quit date: 03/30/1999    Years since quitting: 25.6   Smokeless tobacco: Never  Vaping Use   Vaping status: Never Used  Substance and Sexual Activity   Alcohol use: No   Drug use: No   Sexual activity: Not Currently  Other Topics Concern   Not on file  Social History Narrative   Not on file   Social Drivers of Health   Tobacco Use: Medium Risk (11/27/2024)   Patient History    Smoking Tobacco Use: Former    Smokeless Tobacco Use: Never    Passive Exposure: Not on file  Financial Resource Strain: Medium Risk (05/20/2024)   Received  from [facility]   Overall Financial Resource Strain (CARDIA)    Difficulty of Paying Living Expenses: Somewhat hard  Food Insecurity: No Food Insecurity (10/29/2024)   Epic    Worried About Running Out of Food in the Last Year: Never true    Ran Out of Food in the Last Year: Never true  Transportation Needs: No Transportation Needs (10/29/2024)   Epic    Lack of Transportation (Medical): No    Lack of Transportation (Non-Medical): No  Physical Activity: Not on file  Stress: Not on file  Social Connections: Moderately Integrated (10/29/2024)   Social Connection and Isolation Panel    Frequency of Communication with Friends and Family: Three times a week    Frequency of Social Gatherings with Friends and Family: Three times a week    Attends Religious Services: More than 4 times per year    Active Member of Clubs or Organizations: Yes    Attends Banker Meetings: More than 4 times per year    Marital Status: Widowed  Intimate Partner Violence: Not At Risk (10/29/2024)   Epic     Fear of Current or Ex-Partner: No    Emotionally Abused: No    Physically Abused: No    Sexually Abused: No  Depression (PHQ2-9): Low Risk (11/27/2024)   Depression (PHQ2-9)    PHQ-2 Score: 0  Alcohol Screen: Not on file  Housing: Low Risk (10/29/2024)   Epic    Unable to Pay for Housing in the Last Year: No    Number of Times Moved in the Last Year: 0    Homeless in the Last Year: No  Utilities: Not At Risk (10/29/2024)   Epic    Threatened with loss of utilities: No  Health Literacy: Not on file    Family History  Problem Relation Age of Onset   Valvular heart disease Mother    Diabetes Father    Cancer Sister        lung   Cancer Sister        cervical   Breast cancer Cousin     Current Medications[2]  Physical exam:  Vitals:   11/27/24 1422  BP: (!) 108/46  Pulse: 68  Resp: 18  Temp: (!) 97.4 F (36.3 C)  TempSrc: Tympanic  SpO2: 100%  Weight: 137 lb 12.8 oz (62.5 kg)  Height: 5' 1 (1.549 m)   Physical Exam Constitutional:      Comments: Ambulates with a walker.  Appears in no acute distress  Cardiovascular:     Rate and Rhythm: Normal rate and regular rhythm.     Heart sounds: Normal heart sounds.  Pulmonary:     Effort: Pulmonary effort is normal.     Breath sounds: Normal breath sounds.  Abdominal:     General: Bowel sounds are normal.     Palpations: Abdomen is soft.  Musculoskeletal:     Comments: Bilateral +1 edema  Skin:    General: Skin is warm and dry.  Neurological:     Mental Status: She is alert and oriented to person, place, and time.      I have personally reviewed labs listed below:    Latest Ref Rng & Units 11/18/2024    6:04 AM  CMP  Glucose 70 - 99 mg/dL 96   BUN 8 - 23 mg/dL 26   Creatinine 9.55 - 1.00 mg/dL 8.92   Sodium 864 - 854 mmol/L 131   Potassium 3.5 - 5.1 mmol/L  4.2   Chloride 98 - 111 mmol/L 97   CO2 22 - 32 mmol/L 22   Calcium  8.9 - 10.3 mg/dL 9.1       Latest Ref Rng & Units 11/27/2024    1:43 PM  CBC   WBC 4.0 - 10.5 K/uL 4.3   Hemoglobin 12.0 - 15.0 g/dL 7.8   Hematocrit 63.9 - 46.0 % 24.1   Platelets 150 - 400 K/uL 99    I have personally reviewed Radiology images listed below: No images are attached to the encounter.  CT ANGIO GI BLEED Result Date: 11/14/2024 CLINICAL DATA:  GI bleed. EXAM: CTA ABDOMEN AND PELVIS WITHOUT AND WITH CONTRAST TECHNIQUE: Multidetector CT imaging of the abdomen and pelvis was performed using the standard protocol during bolus administration of intravenous contrast. Multiplanar reconstructed images and MIPs were obtained and reviewed to evaluate the vascular anatomy. RADIATION DOSE REDUCTION: This exam was performed according to the departmental dose-optimization program which includes automated exposure control, adjustment of the mA and/or kV according to patient size and/or use of iterative reconstruction technique. CONTRAST:  OMNIPAQUE  IOHEXOL  350 MG/ML SOLN COMPARISON:  CT abdomen pelvis dated 10/28/2024. FINDINGS: VASCULAR Aorta: Moderate atherosclerotic calcification of the aorta. No abnormal dilatation or dissection. No periaortic fluid collection. Celiac: Patent without evidence of aneurysm, dissection, vasculitis or significant stenosis. SMA: Patent without evidence of aneurysm, dissection, vasculitis or significant stenosis. Renals: Atherosclerotic calcification of the origins of the renal arteries. There is overall decreased renal artery lumen with decreased flow. The renal arteries remain patent. IMA: Faint contrast noted in the proximal RCA. Inflow: Mild atherosclerotic calcification. No aneurysmal dilatation or dissection. The iliac arteries are patent. Proximal Outflow: The visualized proximal pleural patent. Veins: The IVC is unremarkable. The SMV, splenic vein, and main portal vein are patent. No portal venous gas. Review of the MIP images confirms the above findings. NON-VASCULAR Lower chest: The visualized lung bases are clear. There is  cardiomegaly. No intra-abdominal free air.  Small ascites. Hepatobiliary: Cirrhosis. Gallstones. The gallbladder is contracted. Pancreas: There is stranding adjacent to the pancreas which may be related to ascites. Correlation with pancreatic enzymes recommended to exclude acute pancreatitis. Spleen: Borderline splenomegaly measuring 14 cm in length. Adrenals/Urinary Tract: The adrenal glands unremarkable. Mild bilateral renal parenchyma atrophy. There is no hydronephrosis or nephrolithiasis on either side. Subcentimeter hyperdense renal lesions are too small to characterize. The visualized ureters and urinary bladder appear unremarkable. Stomach/Bowel: Postsurgical changes of colectomy with right lower ileostomy. Several thick appearing loop of small bowel in the lower abdomen may be related to diffuse mesenteric edema and ascites or represent enteritis. Clinical correlation is recommended. No bowel obstruction. No evidence of active GI bleed. Lymphatic: No adenopathy. Reproductive: The uterus is grossly unremarkable. Other: Midline vertical anterior pelvic wall incisional scar. Diffuse mesenteric and subcutaneous edema and anasarca. Small ventral supraumbilical fat containing hernia. Musculoskeletal: Degenerative changes of the spine. No acute osseous pathology. IMPRESSION: 1. No evidence of active GI bleed. 2. Postsurgical changes of colectomy with right lower ileostomy. 3. Cirrhosis with small ascites and borderline splenomegaly. 4. Cholelithiasis. 5.  Aortic Atherosclerosis (ICD10-I70.0). Electronically Signed   By: Vanetta Chou M.D.   On: 11/14/2024 20:17   DG Chest Port 1 View Result Date: 10/31/2024 EXAM: 1 VIEW(S) XRAY OF THE CHEST 10/31/2024 10:42:05 AM COMPARISON: 10/27/2024 CLINICAL HISTORY: Wheezing. FINDINGS: LINES, TUBES AND DEVICES: Right internal jugular central line remains in place, unchanged. LUNGS AND PLEURA: Minimal left basilar linear densities most compatible with atelectasis. No  pleural effusion. No pneumothorax. HEART AND MEDIASTINUM: Mild cardiomegaly, stable. BONES AND SOFT TISSUES: Prior median sternotomy. No acute osseous abnormality. IMPRESSION: 1. Left base atelectasis. 2. Mild cardiomegaly, stable. Electronically signed by: Franky Crease MD 10/31/2024 02:32 PM EST RP Workstation: [id number]     Assessment and plan- Patient is a 69 y.o. female here for routine follow-up of anemia  Assessment and Plan    Anemia due to chronic disease and blood loss Chronic anemia exacerbated by gastrointestinal blood loss, requiring transfusions. Current hemoglobin 7.8 g/dL. Iron  studies unreliable post-transfusion. No ongoing GI bleeding. Prior bone marrow biopsy in 2022 did not show any evidence of MDS or other bone marrow pathology that would explain her anemia . Ongoing evaluation with gastroenterology follow-up planned. Supportive care to minimize transfusions due to fluid overload risk. I will plan to give her 2 doses of Feraheme  at this time.  Discussed risks and benefits of Feraheme  including all but not limited to possible risk of infusion anaphylactic reaction.  Patient understands and agrees to proceed as planned - Ordered follow-up blood work in two weeks to reassess hemoglobin and iron  status. - Planned blood transfusion in two weeks if hemoglobin declines or fails to improve. - Scheduled follow-up visit in four weeks to reassess clinical status and laboratory results. - Deferred repeat bone marrow biopsy unless hemoglobin fails to improve with iron  supplementation. - Coordinated care with gastroenterology for further evaluation of gastrointestinal bleeding.     Visit Diagnosis 1. Iron  deficiency anemia due to chronic blood loss      Dr. Annah Skene, MD, MPH [facility] [phone] 11/29/2024 12:53 PM                   [1]  Allergies Allergen Reactions   Vioxx [Rofecoxib] Swelling  [2]  Current Outpatient Medications:     albuterol  (PROVENTIL  HFA;VENTOLIN  HFA) 108 (90 Base) MCG/ACT inhaler, Inhale 2 puffs into the lungs every 6 (six) hours as needed for wheezing or shortness of breath., Disp: , Rfl:    allopurinol  (ZYLOPRIM ) 300 MG tablet, Take 300 mg by mouth daily., Disp: , Rfl:    Blood Glucose Monitoring Suppl (ACCU-CHEK GUIDE ME) w/Device KIT, , Disp: , Rfl:    cyanocobalamin  (VITAMIN B12) 1000 MCG tablet, Take 1,000 mcg by mouth daily., Disp: , Rfl:    empagliflozin  (JARDIANCE ) 10 MG TABS tablet, Take 1 tablet by mouth daily., Disp: , Rfl:    feeding supplement, GLUCERNA SHAKE, (GLUCERNA SHAKE) LIQD, Take 237 mLs by mouth 3 (three) times daily between meals., Disp: 237 mL, Rfl: 0   fluticasone  (FLONASE ) 50 MCG/ACT nasal spray, Place 2 sprays into the nose daily., Disp: , Rfl:    iron  polysaccharides (NIFEREX) 150 MG capsule, Take 1 capsule (150 mg total) by mouth daily., Disp: 30 capsule, Rfl: 2   latanoprost  (XALATAN ) 0.005 % ophthalmic solution, Place 1 drop into both eyes at bedtime., Disp: , Rfl:    levothyroxine  (SYNTHROID ) 50 MCG tablet, Take 50 mcg by mouth daily before breakfast., Disp: , Rfl:    magnesium  oxide (MAG-OX) 400 MG tablet, Take 400 mg by mouth. Take 1 tablet (400 mg total) by mouth once daily, Disp: , Rfl:    metFORMIN  (GLUCOPHAGE ) 1000 MG tablet, Take 1,000 mg by mouth 2 (two) times daily with a meal., Disp: , Rfl:    midodrine  (PROAMATINE ) 5 MG tablet, Take 3 tablets (15 mg total) by mouth 3 (three) times daily with meals., Disp: 90 tablet, Rfl: 2  Multiple Vitamin (MULTI-VITAMIN) tablet, Take 1 tablet by mouth daily., Disp: , Rfl:    nadolol (CORGARD) 20 MG tablet, Take 20 mg by mouth daily., Disp: , Rfl:    pantoprazole  (PROTONIX ) 40 MG tablet, Take 1 tablet (40 mg total) by mouth 2 (two) times daily., Disp: 60 tablet, Rfl: 3   phytonadione  (VITAMIN K) 5 MG tablet, Take 0.5 tablets (2.5 mg total) by mouth daily., Disp: 30 tablet, Rfl: 0   rosuvastatin  (CRESTOR ) 5 MG tablet, Take 5  mg by mouth daily., Disp: , Rfl:    torsemide  (DEMADEX ) 20 MG tablet, Take 20 mg by mouth., Disp: , Rfl:    [Paused] aspirin  EC 81 MG tablet, Take 81 mg by mouth daily. Swallow whole. (Patient not taking: Reported on 11/27/2024), Disp: , Rfl:    furosemide  (LASIX ) 40 MG tablet, Take 1 tablet (40 mg total) by mouth daily. (Patient not taking: Reported on 11/27/2024), Disp: 30 tablet, Rfl: 1   Multiple Vitamin (MULTIVITAMIN WITH MINERALS) TABS tablet, Take 1 tablet by mouth daily., Disp: 30 tablet, Rfl: 0  "

## 2024-12-05 ENCOUNTER — Inpatient Hospital Stay: Attending: Oncology

## 2024-12-05 VITALS — BP 97/50 | HR 66 | Temp 96.4°F | Resp 16

## 2024-12-05 DIAGNOSIS — D509 Iron deficiency anemia, unspecified: Secondary | ICD-10-CM | POA: Diagnosis present

## 2024-12-05 DIAGNOSIS — D500 Iron deficiency anemia secondary to blood loss (chronic): Secondary | ICD-10-CM

## 2024-12-05 MED ORDER — SODIUM CHLORIDE 0.9 % IV SOLN
510.0000 mg | INTRAVENOUS | Status: DC
  Administered 2024-12-05: 510 mg via INTRAVENOUS
  Filled 2024-12-05: qty 510

## 2024-12-05 MED ORDER — SODIUM CHLORIDE 0.9 % IV SOLN
INTRAVENOUS | Status: DC
  Filled 2024-12-05 (×2): qty 250

## 2024-12-05 NOTE — Patient Instructions (Signed)

## 2024-12-11 ENCOUNTER — Inpatient Hospital Stay

## 2024-12-11 ENCOUNTER — Other Ambulatory Visit: Payer: Self-pay | Admitting: Oncology

## 2024-12-11 VITALS — BP 107/57 | HR 70 | Temp 97.2°F | Resp 18

## 2024-12-11 DIAGNOSIS — D500 Iron deficiency anemia secondary to blood loss (chronic): Secondary | ICD-10-CM

## 2024-12-11 DIAGNOSIS — D509 Iron deficiency anemia, unspecified: Secondary | ICD-10-CM | POA: Diagnosis not present

## 2024-12-11 LAB — CBC WITH DIFFERENTIAL (CANCER CENTER ONLY)
Abs Immature Granulocytes: 0.02 K/uL (ref 0.00–0.07)
Basophils Absolute: 0 K/uL (ref 0.0–0.1)
Basophils Relative: 1 %
Eosinophils Absolute: 0.3 K/uL (ref 0.0–0.5)
Eosinophils Relative: 5 %
HCT: 31.2 % — ABNORMAL LOW (ref 36.0–46.0)
Hemoglobin: 9.8 g/dL — ABNORMAL LOW (ref 12.0–15.0)
Immature Granulocytes: 0 %
Lymphocytes Relative: 7 %
Lymphs Abs: 0.4 K/uL — ABNORMAL LOW (ref 0.7–4.0)
MCH: 33.6 pg (ref 26.0–34.0)
MCHC: 31.4 g/dL (ref 30.0–36.0)
MCV: 106.8 fL — ABNORMAL HIGH (ref 80.0–100.0)
Monocytes Absolute: 0.4 K/uL (ref 0.1–1.0)
Monocytes Relative: 7 %
Neutro Abs: 4.6 K/uL (ref 1.7–7.7)
Neutrophils Relative %: 80 %
Platelet Count: 148 K/uL — ABNORMAL LOW (ref 150–400)
RBC: 2.92 MIL/uL — ABNORMAL LOW (ref 3.87–5.11)
RDW: 22.1 % — ABNORMAL HIGH (ref 11.5–15.5)
WBC Count: 5.7 K/uL (ref 4.0–10.5)
nRBC: 0.5 % — ABNORMAL HIGH (ref 0.0–0.2)

## 2024-12-11 LAB — SAMPLE TO BLOOD BANK

## 2024-12-11 MED ORDER — SODIUM CHLORIDE 0.9 % IV SOLN
INTRAVENOUS | Status: DC
  Filled 2024-12-11: qty 250

## 2024-12-11 MED ORDER — SODIUM CHLORIDE 0.9 % IV SOLN
510.0000 mg | INTRAVENOUS | Status: DC
  Administered 2024-12-11: 510 mg via INTRAVENOUS
  Filled 2024-12-11: qty 510

## 2024-12-11 NOTE — Patient Instructions (Signed)

## 2024-12-12 ENCOUNTER — Telehealth: Payer: Self-pay

## 2024-12-12 NOTE — Telephone Encounter (Signed)
 Dickey Pap indicated she will reach out to patient and see if she qualifies for our grants.

## 2024-12-12 NOTE — Telephone Encounter (Signed)
 Voicemail received from patient today 12/12/24 at 11:19am inquiring if there is any grants available to get Vitamin K since it's so expensive.  Best call back number is [phone].  Outbound call to patient who stated she obtains Vitamin K at [facility], pays $181 for a month supply.  Mentioned her deductible did reset at the beginning of the year. Explained unfortunately we do not have any available grant / funding for off site pharmacies and that she would need to directly inquire with them.  Provided telephone number to [facility] pharmacy in the event she wanted to research pricing. Informed if she decides to go with [facility] we can reach out and see if there's any grants / programs onsite to assist.  Patient verbalized understanding.

## 2024-12-13 ENCOUNTER — Other Ambulatory Visit: Payer: Self-pay

## 2024-12-13 ENCOUNTER — Telehealth: Payer: Self-pay | Admitting: Pharmacy Technician

## 2024-12-13 ENCOUNTER — Inpatient Hospital Stay

## 2024-12-13 ENCOUNTER — Encounter: Payer: Self-pay | Admitting: Oncology

## 2024-12-13 NOTE — Telephone Encounter (Signed)
 I spoke with [facility] and they did a test claim for the Vitamin K.  The patient's prescription coverage does not cover the Vitamin K.  The patient's cost for the Vitamin K will be over $1,000 as opposed to the $181 that she is currently paying at [facility].  The big difference in cost may have something to do with the vendor where the medication is purchased.  I spoke with patient and she said that she cannot afford the $181.  She wants to know if she can take the over-the-counter Vitamin K?  If not, would like her doctor to make another recommendation that will be covered by her insurance.  I made her medical team aware.  Tammy Arias Patient Services Navigator [facility]

## 2024-12-13 NOTE — Telephone Encounter (Signed)
 Spoke with pharmacist at [facility] regarding the cost of the 5mg  Vitamin K.  Pharmacist ran test claim which indicated that the Vitamin K is not covered by the patient's prescription drug coverage.  The acquisition cost of the Vitamin K would be over $1,000.  I also was unable to find any PAP programs for Vitamin K.  I reached out to the [facility] to see if they provide assistance for Vitamin K but they do not.  Attempted to contact patient to make aware but was unable to reach.  Left message for patient to return my phone call.  Tammy Arias Patient Services Navigator [facility]

## 2024-12-17 NOTE — Progress Notes (Signed)
 "   PATIENT PROFILE: Tammy Arias is a 69 y.o. female who presents to the [facility] for follow up. Pt was last seen in clinic 06/2024.  Complex past medical history cirrhosis, chronic kidney disease, hypertension, recurrent GI bleeding, atrial fibs on Coumadin .  C. 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    Had admission December 2025 for GI bleeding from colostomy site that required multiple units of transfusion as well as ICU admission and pressor support.  She has seen hematology, has had bone marrow evaluation 2022 that was negative.  She received 2 doses of Feraheme  last week. Follows closely w/ hematology.   HISTORY OF PRESENT ILLNESS: Tammy Arias reports since hospital discharge she had a small amount of maroon stool Christmas Eve but has not seen any since.  Stools have returned to a liquid brown/green-she has had some undigested food into her stool output.  She has not had any severe abdominal pain.  Occasional irritation & itching around ostomy site but no further bleeding.  She does complain of some nausea over the past couple days.  She just finished a course of Cipro yesterday.  She was taking the Cipro with food.  She denies any heartburn reflux symptoms.  Nausea vomiting is not a frequent occurrence and only occurred while on antibiotics for UTI.  She does feel some hemorrhoid symptoms with some protrusion and a scant amount of bright red blood, denies any large-volume rectal bleeding.  Has used hemorrhoid cream in past with good results and plans to restart.  Denies tobacco-alcohol NSAIDs.    GENERAL REVIEW OF SYSTEMS:  Constitutional: No weight loss/weight gain   Eyes: No changes in vision. ENT: No oral lesions, sore throat.   GI: see HPI.   Heme/Lymph: No easy bruising.   CV: No chest pain. Resp: No cough, SOB.     GU: No hematuria.   Integumentary: No rashes.   Neuro: No headaches.   Psych: No  depression/anxiety.   Endocrine: No heat/cold intolerance.   Musculoskeletal: No joint swelling.   MEDICATIONS: Outpatient Encounter Medications as of 12/17/2024  Medication Sig Dispense Refill   albuterol  90 mcg/actuation inhaler Inhale 2 inhalations into the lungs every 6 (six) hours as needed for Wheezing 8.5 g 2   allopurinoL  (ZYLOPRIM ) 300 MG tablet Take 1 tablet (300 mg total) by mouth once daily 30 tablet 11   blood glucose diagnostic (ONETOUCH ULTRA TEST) test strip USE TWICE DAILY AS DIRECTED 100 each 3   blood glucose meter kit 2 (two) times daily 1 each 0   blood sugar diagnostic, disc (BLOOD GLUCOSE DIAGNOSTIC, DISC) test strip Use 2 (two) times daily 100 each 11   cyanocobalamin  (VITAMIN B12) 1000 MCG tablet Take 1,000 mcg by mouth once daily     docusate (COLACE) 100 MG capsule Take 100 mg by mouth once daily     empagliflozin  (JARDIANCE ) 10 mg tablet Take 1 tablet (10 mg total) by mouth once daily     fluticasone  propionate (FLONASE ) 50 mcg/actuation nasal spray Place 2 sprays into both nostrils once daily 16 g 11   iron  polysaccharides (FERREX) 150 mg iron  capsule Take 150 mg by mouth once daily     lancets Use 1 each 2 (two) times daily 100 each 11   latanoprost  (XALATAN ) 0.005 % ophthalmic solution Place 1 drop into both eyes at bedtime     levothyroxine  (SYNTHROID ) 50 MCG tablet TAKE 1 TABLET BY MOUTH ONCE  DAILY ON AN EMPTY STOMACH. WAIT 30 MINUTES BEFORE TAKING OTHER MEDS. 30 tablet 11   magnesium  oxide (MAG-OX) 400 mg (241.3 mg magnesium ) tablet Take 1 tablet (400 mg total) by mouth once daily 30 tablet 11   metFORMIN  (GLUCOPHAGE ) 1000 MG tablet TAKE 1 TABLET BY MOUTH TWICE DAILY WITH MEALS 180 tablet 3   metOLazone  (ZAROXOLYN ) 2.5 MG tablet Take 2.5 mg by mouth     midodrine  (PROAMATINE ) 5 MG tablet Take 15 mg by mouth     multivitamin tablet Take 1 tablet by mouth once daily     nadoloL (CORGARD) 20 MG tablet Take 1 tablet (20 mg total) by mouth once  daily 30 tablet 11   nut.tx.gluc.intol,lac-free,soy (GLUCERNA SHAKE) Liqd Take 237 mLs by mouth     pantoprazole  (PROTONIX ) 40 MG DR tablet Take 1 tablet (40 mg total) by mouth once daily (Patient taking differently: Take 40 mg by mouth 2 (two) times daily) 90 tablet 3   phytonadione , vitamin K1 , (MEPHYTON ) 5 mg tablet Take 2.5 mg by mouth once daily     rosuvastatin  (CRESTOR ) 5 MG tablet Take 1 tablet (5 mg total) by mouth once daily 30 tablet 11   spironolactone  (ALDACTONE ) 25 MG tablet Take 0.5 tablets (12.5 mg total) by mouth once daily     TORsemide  (DEMADEX ) 20 MG tablet Take 1 tablet (20 mg total) by mouth once daily (Patient taking differently: Take 20 mg by mouth 2 (two) times daily) 90 tablet 3   ascorbic acid , vitamin C , (VITAMIN C ) 500 MG tablet Take 500 mg by mouth 2 (two) times daily (Patient not taking: Reported on 12/17/2024)     blood glucose meter kit 2 (two) times daily 1 each 1   bumetanide (ENBUMYST) 0.5 mg/spray (0.1 mL) [place] 0.5 mg into one nostril once daily as needed (Instill 0.5mg  into the nostril daily as directed by the heart failure team) (Patient not taking: Reported on 12/17/2024) 24 each 0   ciprofloxacin HCl (CIPRO) 250 MG tablet Take 1 tablet (250 mg total) by mouth 2 (two) times daily for 7 days (Patient not taking: Reported on 12/17/2024) 14 tablet 0   TORsemide  (DEMADEX ) 20 MG tablet Take 1 tablet (20 mg total) by mouth once daily (Patient not taking: Reported on 12/17/2024) 90 tablet 3   No facility-administered encounter medications on file as of 12/17/2024.    ALLERGIES: Vioxx [rofecoxib]  PAST MEDICAL HISTORY: Past Medical History:  Diagnosis Date   Anemia    Arthritis    Atrial fibrillation (CMS/HHS-HCC)    Cardiomyopathy, secondary (CMS/HHS-HCC)    ef 25-30   Cataract cortical, senile    Congestive heart disease (CMS/HHS-HCC)    Coronary artery disease    Heart murmur    History of shingles    Hyperlipidemia     Hypertension    Hypothyroidism    IBS (irritable bowel syndrome)    Migraines    Osteoporosis, post-menopausal    Stroke (CMS/HHS-HCC)    Type 2 diabetes mellitus (CMS/HHS-HCC)     PAST SURGICAL HISTORY: Past Surgical History:  Procedure Laterality Date   CORONARY ARTERY BYPASS GRAFT  2005   Coronary artery bypass grafting, x 3, in 2005.   HERNIA REPAIR  04/21/2017   [facility]  ----  Dr Unknown Sharps   ARTHROPLASTY TOTAL KNEE Right 04/05/2022   Dr. Kathlynn   Colon @ 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   TUBAL LIGATION  1980s     FAMILY HISTORY: Family History  Problem Relation Name Age of Onset   Stroke Mother     High blood pressure (Hypertension) Mother     Myocardial Infarction (Heart attack) Father     Stroke Father     Diabetes type II Father     Dementia Sister  85   Lung cancer Sister  48       small cell   Anesthesia problems Neg Hx     Malignant hypertension Neg Hx     Malignant hyperthermia Neg Hx     Pseudochol deficiency Neg Hx     PONV Neg Hx       SOCIAL HISTORY: Social History   Socioeconomic History   Marital status: Widowed  Tobacco Use   Smoking status: Former    Current packs/day: 0.00    Average packs/day: 1.5 packs/day for 14.0 years (21.0 ttl pk-yrs)    Types: Cigarettes    Start date: 21    Quit date: 31    Years since quitting: 39.0    Passive exposure: Past   Smokeless tobacco: Never   Tobacco comments:    quit 25 years ago  Vaping Use   Vaping status: Never Used  Substance and Sexual Activity   Alcohol use: No    Alcohol/week: 0.0 standard drinks of alcohol   Drug  use: No   Sexual activity: Never  Social History Narrative   Education: na   Occupation: CNA   Hobbies: na   Marital Status: widowed   Social Drivers of Corporate Investment Banker Strain: Medium Risk (05/20/2024)   Overall Financial Resource Strain (CARDIA)    Difficulty of Paying Living Expenses: Somewhat hard  Food Insecurity: No Food Insecurity (10/29/2024)   Received from [facility]   Hunger Vital Sign    Within the past 12 months, you worried that your food would run out before you got the money to buy more.: Never true    Within the past 12 months, the food you bought just didn't last and you didn't have money to get more.: Never true  Transportation Needs: No Transportation Needs (10/29/2024)   Received from [facility]    In the past 12 months, has lack of transportation kept you from medical appointments or from getting medications?: No    In the past 12 months, has lack of transportation kept you from meetings, work, or from getting things needed for daily living?: No    Vitals:   12/17/24 0934  BP: 90/51  Pulse: 62  Weight: 63.5 kg (140 lb)  Height: 154.9 cm (5' 1)    Wt Readings from Last 3 Encounters:  12/17/24 63.5 kg (140 lb)  12/05/24 62.6 kg (138 lb)  12/03/24 61.2 kg (135 lb)     PHYSICAL EXAM: General: NAD, alert and oriented x 4 HEENT: PEERLA, EOMI, anciteric  Neck: supple, no JVD or thyromegaly. No lymphadenopathy.  Respiratory: CTA bilaterally, no wheezes, crackles, or other adventitious sounds Cardiac: RRR, no murmur, rub, or gallop  Abd: soft, normal bowel sounds, no TTP, no HSM, no rebound or guarding Ext: no edema, well perfused with 2+ pulses,  Skin: Skin color, texture, turgor normal, no rashes or lesions Lymph: no LAD Neuro: Grossly intact   REVIEW OF DATA: I have reviewed the following data today:  Hospital d/c summary 09/2024- History of AVMs Esophageal varices History of recurrent GI  bleeds- --had on and off blood in the ostomy, substantial amount. Pt has had extensive GI workup in the past, GI has signed off. --GenSurg consulted, found some oozing from the inferior lateral edge of the stoma at the mucosa, but this stopped on its own relatively quick. Silver  nitrate was applied to cauterize this on 12/18. Unclear that this would cause so much blood loss to require transfusions, however, no more bleeding into ostomy since cauterization. --CT angio bleeding scan again neg --monitor for blood in ostomy bag-- no more bleeding and ostomy bag. Hemoglobin remains stable. Hemoglobin 8.5  Acute on chronic anemia due to GI blood loss -- patient had multiple blood transfusions during hospitalization.  --after discharge, will need to f/u with Dr. Melanee for Hgb monitoring and PRN transfusion.   12/11/2024---Hgb 9.8. MCV 106.8. Platelet 148.   IMPRESSION: 11/14/2024--CT angio GI bleed 1. No evidence of active GI bleed.  2. Postsurgical changes of colectomy with right lower ileostomy.  3. Cirrhosis with small ascites and borderline splenomegaly.  4. Cholelithiasis.  5.  Aortic Atherosclerosis (ICD10-I70.0).    06/2024--EGD grade 2 varices, PHG, gastric vascular ectasia without bleeding.  Normal jejunum and duodenum   August 2025--VCE - no small bowel bleeding seen.   ASSESSMENT AND PLAN: Ms. Holsapple is a 69 y.o. female presenting for follow up: Diagnoses and all orders for this visit:  Acute blood loss anemia  Nausea    1.  Acute blood loss anemia-chronic intermittent symptoms and has had multiple hospitalizations.  She has had evaluation including endoscopy, colonoscopy, multiple CT angio GI bleeding scans as well as video capsule has been negative.  Small blood loss potentially around ostomy site but this has been evaluated by surgery and improved. 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  We discussed if bleeding returns and she  is seeing maroon-colored stool in ostomy bag contacting our office immediately and we will try to arrange endoscopy with [facility] while bleeding is occurring which may be more helpful.  Provided contact information for our office. Hematology is checking labs routinely and Hgb is improving.   2.  Nausea-May be related to recent Cipro for UTI.  Given his C. difficile did recommend starting probiotic now that she has completed antibiotic.  To notify me if symptoms continue but likely were medication related.  3. 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   Case discussed in detail w/ Dr. Onita.     Attestation Statement:   I personally performed the service, non-incident to. [facility])   JANICE BRIDGES [place], PA    Romero NOVAK. Mevelyn RIGGERS [facility]  "

## 2024-12-19 ENCOUNTER — Other Ambulatory Visit
Admission: RE | Admit: 2024-12-19 | Discharge: 2024-12-19 | Disposition: A | Discharge status: Home / Self Care | Source: Ambulatory Visit | Attending: Nephrology | Admitting: Nephrology

## 2024-12-19 ENCOUNTER — Other Ambulatory Visit: Payer: Self-pay | Admitting: Nephrology

## 2024-12-19 DIAGNOSIS — N179 Acute kidney failure, unspecified: Secondary | ICD-10-CM

## 2024-12-19 LAB — RENAL FUNCTION PANEL
Albumin: 4.3 g/dL (ref 3.5–5.0)
Anion gap: 27 — ABNORMAL HIGH (ref 5–15)
BUN: 79 mg/dL — ABNORMAL HIGH (ref 8–23)
CO2: 8 mmol/L — ABNORMAL LOW (ref 22–32)
Calcium: 9.8 mg/dL (ref 8.9–10.3)
Chloride: 95 mmol/L — ABNORMAL LOW (ref 98–111)
Creatinine, Ser: 7.78 mg/dL — ABNORMAL HIGH (ref 0.44–1.00)
GFR, Estimated: 5 mL/min — ABNORMAL LOW
Glucose, Bld: 94 mg/dL (ref 70–99)
Phosphorus: 6.6 mg/dL — ABNORMAL HIGH (ref 2.5–4.6)
Potassium: 5.3 mmol/L — ABNORMAL HIGH (ref 3.5–5.1)
Sodium: 129 mmol/L — ABNORMAL LOW (ref 135–145)

## 2024-12-20 ENCOUNTER — Emergency Department
Admission: EM | Admit: 2024-12-20 | Discharge: 2024-12-29 | Disposition: E | Attending: Family Medicine | Admitting: Family Medicine

## 2024-12-20 ENCOUNTER — Other Ambulatory Visit: Payer: Self-pay

## 2024-12-20 ENCOUNTER — Telehealth: Payer: Self-pay | Admitting: *Deleted

## 2024-12-20 ENCOUNTER — Emergency Department

## 2024-12-20 DIAGNOSIS — I469 Cardiac arrest, cause unspecified: Secondary | ICD-10-CM | POA: Insufficient documentation

## 2024-12-20 DIAGNOSIS — J9690 Respiratory failure, unspecified, unspecified whether with hypoxia or hypercapnia: Secondary | ICD-10-CM

## 2024-12-20 DIAGNOSIS — K922 Gastrointestinal hemorrhage, unspecified: Secondary | ICD-10-CM | POA: Insufficient documentation

## 2024-12-20 DIAGNOSIS — E162 Hypoglycemia, unspecified: Secondary | ICD-10-CM | POA: Insufficient documentation

## 2024-12-20 DIAGNOSIS — E875 Hyperkalemia: Secondary | ICD-10-CM | POA: Diagnosis not present

## 2024-12-20 DIAGNOSIS — Z79899 Other long term (current) drug therapy: Secondary | ICD-10-CM | POA: Insufficient documentation

## 2024-12-20 DIAGNOSIS — R001 Bradycardia, unspecified: Secondary | ICD-10-CM | POA: Diagnosis not present

## 2024-12-20 DIAGNOSIS — R464 Slowness and poor responsiveness: Secondary | ICD-10-CM | POA: Diagnosis present

## 2024-12-20 DIAGNOSIS — R0603 Acute respiratory distress: Secondary | ICD-10-CM | POA: Diagnosis present

## 2024-12-20 DIAGNOSIS — J9610 Chronic respiratory failure, unspecified whether with hypoxia or hypercapnia: Secondary | ICD-10-CM | POA: Insufficient documentation

## 2024-12-20 LAB — CBC WITH DIFFERENTIAL/PLATELET
Abs Immature Granulocytes: 0.14 K/uL — ABNORMAL HIGH (ref 0.00–0.07)
Basophils Absolute: 0 K/uL (ref 0.0–0.1)
Basophils Relative: 0 %
Eosinophils Absolute: 0 K/uL (ref 0.0–0.5)
Eosinophils Relative: 0 %
HCT: 20.4 % — ABNORMAL LOW (ref 36.0–46.0)
Hemoglobin: 5.9 g/dL — ABNORMAL LOW (ref 12.0–15.0)
Immature Granulocytes: 3 %
Lymphocytes Relative: 6 %
Lymphs Abs: 0.3 K/uL — ABNORMAL LOW (ref 0.7–4.0)
MCH: 34.3 pg — ABNORMAL HIGH (ref 26.0–34.0)
MCHC: 28.9 g/dL — ABNORMAL LOW (ref 30.0–36.0)
MCV: 118.6 fL — ABNORMAL HIGH (ref 80.0–100.0)
Monocytes Absolute: 0.5 K/uL (ref 0.1–1.0)
Monocytes Relative: 9 %
Neutro Abs: 4.7 K/uL (ref 1.7–7.7)
Neutrophils Relative %: 82 %
Platelets: 100 K/uL — ABNORMAL LOW (ref 150–400)
RBC: 1.72 MIL/uL — ABNORMAL LOW (ref 3.87–5.11)
RDW: 21 % — ABNORMAL HIGH (ref 11.5–15.5)
WBC: 5.7 K/uL (ref 4.0–10.5)
nRBC: 6.3 % — ABNORMAL HIGH (ref 0.0–0.2)

## 2024-12-20 LAB — TYPE AND SCREEN
Unit division: 0
Unit division: 0
Unit division: 0
Unit division: 0

## 2024-12-20 LAB — COMPREHENSIVE METABOLIC PANEL WITH GFR
ALT: 24 U/L (ref 0–44)
AST: 51 U/L — ABNORMAL HIGH (ref 15–41)
Albumin: 3.1 g/dL — ABNORMAL LOW (ref 3.5–5.0)
Alkaline Phosphatase: 96 U/L (ref 38–126)
BUN: 88 mg/dL — ABNORMAL HIGH (ref 8–23)
CO2: <7 mmol/L — ABNORMAL LOW (ref 22–32)
Calcium: 8.2 mg/dL — ABNORMAL LOW (ref 8.9–10.3)
Chloride: 96 mmol/L — ABNORMAL LOW (ref 98–111)
Creatinine, Ser: 8.26 mg/dL — ABNORMAL HIGH (ref 0.44–1.00)
GFR, Estimated: 5 mL/min — ABNORMAL LOW
Glucose, Bld: 287 mg/dL — ABNORMAL HIGH (ref 70–99)
Potassium: 6.7 mmol/L (ref 3.5–5.1)
Sodium: 129 mmol/L — ABNORMAL LOW (ref 135–145)
Total Bilirubin: 0.5 mg/dL (ref 0.0–1.2)
Total Protein: 4.8 g/dL — ABNORMAL LOW (ref 6.5–8.1)

## 2024-12-20 LAB — TROPONIN T, HIGH SENSITIVITY: Troponin T High Sensitivity: 78 ng/L — ABNORMAL HIGH (ref 0–19)

## 2024-12-20 LAB — CBG MONITORING, ED
Glucose-Capillary: 43 mg/dL — CL (ref 70–99)
Glucose-Capillary: <10 mg/dL — CL (ref 70–99)
Glucose-Capillary: 176 mg/dL — ABNORMAL HIGH (ref 70–99)

## 2024-12-20 LAB — DIC (DISSEMINATED INTRAVASCULAR COAGULATION)PANEL
D-Dimer, Quant: 2.8 ug{FEU}/mL — ABNORMAL HIGH (ref 0.00–0.50)
Fibrinogen: 174 mg/dL — ABNORMAL LOW (ref 210–475)
INR: 2.6 — ABNORMAL HIGH (ref 0.8–1.2)
Platelets: 100 K/uL — ABNORMAL LOW (ref 150–400)
Prothrombin Time: 28.8 s — ABNORMAL HIGH (ref 11.4–15.2)
Smear Review: NONE SEEN
aPTT: 67 s — ABNORMAL HIGH (ref 24–36)

## 2024-12-20 LAB — HEMOGLOBIN AND HEMATOCRIT, BLOOD
HCT: 19.9 % — ABNORMAL LOW (ref 36.0–46.0)
Hemoglobin: 5.8 g/dL — ABNORMAL LOW (ref 12.0–15.0)

## 2024-12-20 LAB — PROCALCITONIN: Procalcitonin: 0.42 ng/mL

## 2024-12-20 LAB — LACTIC ACID, PLASMA: Lactic Acid, Venous: >9 mmol/L (ref 0.5–1.9)

## 2024-12-20 LAB — LIPASE, BLOOD: Lipase: 803 U/L — ABNORMAL HIGH (ref 11–51)

## 2024-12-20 LAB — PROTIME-INR
INR: 2.6 — ABNORMAL HIGH (ref 0.8–1.2)
Prothrombin Time: 29.2 s — ABNORMAL HIGH (ref 11.4–15.2)

## 2024-12-20 LAB — PRO BRAIN NATRIURETIC PEPTIDE: Pro Brain Natriuretic Peptide: 33481 pg/mL — ABNORMAL HIGH

## 2024-12-20 LAB — MASSIVE TRANSFUSION PROTOCOL ORDER (BLOOD BANK NOTIFICATION)

## 2024-12-20 LAB — MAGNESIUM: Magnesium: 1.9 mg/dL (ref 1.7–2.4)

## 2024-12-20 MED ORDER — METRONIDAZOLE 500 MG/100ML IV SOLN: 500.0000 mg | Freq: Once | INTRAVENOUS | Status: DC

## 2024-12-20 MED ORDER — SODIUM CHLORIDE 0.9 % IV SOLN: 2.0000 g | Freq: Once | INTRAVENOUS | Status: DC

## 2024-12-20 MED ORDER — ETOMIDATE 2 MG/ML IV SOLN
INTRAVENOUS | Status: AC
  Filled 2024-12-20: qty 20

## 2024-12-20 MED ORDER — ATROPINE SULFATE 1 MG/10ML IJ SOSY
PREFILLED_SYRINGE | INTRAMUSCULAR | Status: DC | PRN
  Administered 2024-12-20: 1 mg via INTRAVENOUS

## 2024-12-20 MED ORDER — CALCIUM GLUCONATE 10 % IV SOLN
1.0000 g | Freq: Once | INTRAVENOUS | Status: AC
  Administered 2024-12-20: 1 g via INTRAVENOUS

## 2024-12-20 MED ORDER — ROCURONIUM BROMIDE 10 MG/ML (PF) SYRINGE
PREFILLED_SYRINGE | INTRAVENOUS | Status: AC
  Filled 2024-12-20: qty 10

## 2024-12-20 MED ORDER — EPINEPHRINE 1 MG/10ML IV SOSY
PREFILLED_SYRINGE | INTRAVENOUS | Status: DC | PRN
  Administered 2024-12-20 (×4): 1 mg via INTRAVENOUS

## 2024-12-20 MED ORDER — LACTATED RINGERS IV SOLN: INTRAVENOUS | Status: DC

## 2024-12-20 MED ORDER — MAGNESIUM SULFATE 2 GM/50ML IV SOLN: 2.0000 g | Freq: Once | INTRAVENOUS | Status: DC

## 2024-12-20 MED ORDER — VANCOMYCIN HCL IN DEXTROSE 1-5 GM/200ML-% IV SOLN: 1000.0000 mg | Freq: Once | INTRAVENOUS | Status: DC

## 2024-12-20 MED ORDER — VITAMIN K1 10 MG/ML IJ SOLN
10.0000 mg | Freq: Once | INTRAVENOUS | Status: DC
  Filled 2024-12-20: qty 1

## 2024-12-23 LAB — BPAM RBC
Blood Product Expiration Date: 202602202359
Blood Product Expiration Date: 202602202359
Blood Product Expiration Date: 202602222359
Blood Product Expiration Date: 202602222359
ISSUE DATE / TIME: 202601240531
Unit Type and Rh: 5100
Unit Type and Rh: 5100
Unit Type and Rh: 5100
Unit Type and Rh: 5100

## 2024-12-25 ENCOUNTER — Inpatient Hospital Stay: Admitting: Oncology

## 2024-12-25 ENCOUNTER — Inpatient Hospital Stay

## 2024-12-27 ENCOUNTER — Inpatient Hospital Stay

## 2024-12-29 DIAGNOSIS — 419620001 Death: Secondary | SNOMED CT | POA: Insufficient documentation

## 2024-12-29 NOTE — ED Notes (Addendum)
 Time out: MD Nicholaus at bedside to place emergent central line

## 2024-12-29 NOTE — Code Documentation (Addendum)
 Pulse check PEA on the monitor No palpable pulse present CPR resumed

## 2024-12-29 NOTE — Telephone Encounter (Signed)
 Patient passed away earlier today.

## 2024-12-29 NOTE — Telephone Encounter (Signed)
 Called patients contact in regards to most recent STAT lab results. I advised her that I have been unable to reach the patient in regards to kidney function declining so I have left multiple messages asking that she return my call ASAP. She then asked about labs, and I informed her that kidney function came back at 5% so Dr. Dennise would like for her to go to the hospital immediately, and she notes understanding. But states that she has been unable to reach her over the phone and can not get her to answer the door, so she has called the police for a welfare check at this time. I advised her to update our office accordingly, and she agrees.

## 2024-12-29 NOTE — ED Notes (Signed)
 Lab informed to activate MTP protocol

## 2024-12-29 NOTE — Code Documentation (Addendum)
 Pulse check No palpable pulse present PEA on the monitor. CPR resumed

## 2024-12-29 NOTE — ED Notes (Addendum)
 Pt's glasses found in room while cleaning room. This Rn spoke with pt's son, whom would like mother's friend Asberry Nutley to pick up glasses. This Rn spoke with family member of rebecca, and notified her that the pt's glasses will be at front desk for pick up.

## 2024-12-29 NOTE — ED Provider Notes (Signed)
 At 1253 I discussed the case with medical examiner Norleen Shallow and patient is not an ME case and death note to go to PCP   Nicholaus Rolland BRAVO, MD 12/07/2024 [phone]

## 2024-12-29 NOTE — ED Notes (Signed)
 Blood bank informed to cancel MTP due to pt expiring.

## 2024-12-29 NOTE — Code Documentation (Signed)
 Pulse check Asystole on the monitor No palpable pulse present

## 2024-12-29 NOTE — ED Notes (Signed)
"  Rocuronium  given  "

## 2024-12-29 NOTE — Telephone Encounter (Signed)
 Tammy Arias Social worker from [facility] (call back number [phone] ext [phone]) Patient called [employer] to see if she can be on oral OTC vitamin K or alternative. Out of pocket cost for rx vit K is extremely high for patient.  I see another phone call re: this patient and vitamin K on the chart from 1/16. I do not see an out come of what was decided. Jereld, can you follow-up.

## 2024-12-29 NOTE — Code Documentation (Addendum)
 CPR in progress Family at bedside

## 2024-12-29 NOTE — ED Notes (Signed)
 Pt became extremely bradycardic. No palpable pulse present. CPR initiated. EDP called to bedside.

## 2024-12-29 NOTE — Telephone Encounter (Signed)
 Attempted to contact patient 4x in regards to most recent STAT lab results. I am unable to reach her so I left a message asking that she return my call ASAP.

## 2024-12-29 NOTE — Progress Notes (Signed)
 SPIRITUAL CARE AND COUNSELING CONSULT NOTE   VISIT SUMMARY   SPIRITUAL ENCOUNTER                                                                                                                                                                      Type of Visit: Initial Care provided to:: Pt and family Conversation partners present during encounter: Nurse, Physician Referral source: Chaplain assessment Reason for visit: Urgent spiritual support OnCall Visit: No   SPIRITUAL FRAMEWORK      GOALS       INTERVENTIONS   Spiritual Care Interventions Made: Prayer    INTERVENTION OUTCOMES      SPIRITUAL CARE PLAN   Spiritual Care Issues Still Outstanding: No further spiritual care needs at this time (see row info)    If immediate needs arise, please contact [facility] 24 hour on call [phone]   Sari Hugh, Chaplain  01/18/2025 2:59 PM

## 2024-12-29 NOTE — ED Notes (Signed)
 Levophed  administered at 2mL

## 2024-12-29 NOTE — ED Provider Notes (Signed)
 "  [facility] Provider Note    Event Date/Time   First MD Initiated Contact with Patient 2024-12-26 1134     (approximate)   History   unresponsive   HPI  Tammy Arias is a 69 y.o. female who arrives with a history of renal failure and GI ostomy and limited other history who presents via EMS acutely.  Per history, patient was found down at home when neighbors called for a well check as they had not seen the patient in over 24 hours.  EMS states that the patient was unresponsive on their arrival and the bathroom was covered with bright red blood without a clear source.  Patient's systolic blood pressure was in the 30s and her glucose was undetectable.  They administered IV fluids and D5 and route.  Remainder of history is very limited     Physical Exam   Triage Vital Signs: ED Triage Vitals [26-Dec-2024 1133]  Encounter Vitals Group     BP 90/60     Girls Systolic BP Percentile      Girls Diastolic BP Percentile      Boys Systolic BP Percentile      Boys Diastolic BP Percentile      Pulse Rate (!) 32     Resp 13     Temp      Temp src      SpO2 100 %     Weight      Height      Head Circumference      Peak Flow      Pain Score      Pain Loc      Pain Education      Exclude from Growth Chart     Most recent vital signs: Vitals:   December 26, 2024 1249 12-26-24 1250  BP:    Pulse: (!) 55 (!) 56  Resp:    SpO2: (!) 63% (!) 59%    Nursing Triage Note reviewed. Vital signs reviewed and patients oxygen saturation is hypoxic.   General: Patient is thin, mottled, appears unwell, in distress Head: Normocephalic  Eyes: Pupils reactive Ear, nose, throat: Normal external exam Neck: Normal range of motion Respiratory: Patient is in respiratory distress, rhonchi Cardiovascular: Patient is bradycardic GI: Ostomy in place, soft, there does appear to be blood in the ostomy Extremities: No long bone deformities, poor perfusion Neuro: Patient only opens  eyes to pain, nonverbal, not moving extremities spontaneously   ED Results / Procedures / Treatments   Labs (all labs ordered are listed, but only abnormal results are displayed) Labs Reviewed  LACTIC ACID, PLASMA - Abnormal; Notable for the following components:      Result Value   Lactic Acid, Venous >9.0 (*)    All other components within normal limits  COMPREHENSIVE METABOLIC PANEL WITH GFR - Abnormal; Notable for the following components:   Sodium 129 (*)    Potassium 6.7 (*)    Chloride 96 (*)    CO2 <7 (*)    Glucose, Bld 287 (*)    BUN 88 (*)    Creatinine, Ser 8.26 (*)    Calcium  8.2 (*)    Total Protein 4.8 (*)    Albumin  3.1 (*)    AST 51 (*)    GFR, Estimated 5 (*)    All other components within normal limits  CBC WITH DIFFERENTIAL/PLATELET - Abnormal; Notable for the following components:   RBC 1.72 (*)    Hemoglobin 5.9 (*)  HCT 20.4 (*)    MCV 118.6 (*)    MCH 34.3 (*)    MCHC 28.9 (*)    RDW 21.0 (*)    Platelets 100 (*)    nRBC 6.3 (*)    Lymphs Abs 0.3 (*)    Abs Immature Granulocytes 0.14 (*)    All other components within normal limits  PRO BRAIN NATRIURETIC PEPTIDE - Abnormal; Notable for the following components:   Pro Brain Natriuretic Peptide 33,481.0 (*)    All other components within normal limits  LIPASE, BLOOD - Abnormal; Notable for the following components:   Lipase 803 (*)    All other components within normal limits  PROTIME-INR - Abnormal; Notable for the following components:   Prothrombin Time 29.2 (*)    INR 2.6 (*)    All other components within normal limits  DIC (DISSEMINATED INTRAVASCULAR COAGULATION)PANEL - Abnormal; Notable for the following components:   Prothrombin Time 28.8 (*)    INR 2.6 (*)    aPTT 67 (*)    Fibrinogen 174 (*)    D-Dimer, Quant 2.80 (*)    Platelets 100 (*)    All other components within normal limits  HEMOGLOBIN AND HEMATOCRIT, BLOOD - Abnormal; Notable for the following components:    Hemoglobin 5.8 (*)    HCT 19.9 (*)    All other components within normal limits  CBG MONITORING, ED - Abnormal; Notable for the following components:   Glucose-Capillary <10 (*)    All other components within normal limits  CBG MONITORING, ED - Abnormal; Notable for the following components:   Glucose-Capillary 43 (*)    All other components within normal limits  CBG MONITORING, ED - Abnormal; Notable for the following components:   Glucose-Capillary 176 (*)    All other components within normal limits  TROPONIN T, HIGH SENSITIVITY - Abnormal; Notable for the following components:   Troponin T High Sensitivity 78 (*)    All other components within normal limits  RESP PANEL BY RT-PCR (RSV, FLU A&B, COVID)  RVPGX2  CULTURE, BLOOD (ROUTINE X 2)  CULTURE, BLOOD (ROUTINE X 2)  MAGNESIUM   PROCALCITONIN  LACTIC ACID, PLASMA  BLOOD GAS, VENOUS  URINALYSIS, W/ REFLEX TO CULTURE (INFECTION SUSPECTED)  BLOOD GAS, ARTERIAL  DIC (DISSEMINATED INTRAVASCULAR COAGULATION)PANEL  DIC (DISSEMINATED INTRAVASCULAR COAGULATION)PANEL  DIC (DISSEMINATED INTRAVASCULAR COAGULATION)PANEL  DIC (DISSEMINATED INTRAVASCULAR COAGULATION)PANEL  HEMOGLOBIN AND HEMATOCRIT, BLOOD  HEMOGLOBIN AND HEMATOCRIT, BLOOD  HEMOGLOBIN AND HEMATOCRIT, BLOOD  HEMOGLOBIN AND HEMATOCRIT, BLOOD  TYPE AND SCREEN  MASSIVE TRANSFUSION PROTOCOL ORDER (BLOOD BANK NOTIFICATION)  TROPONIN T, HIGH SENSITIVITY     EKG EKG and rhythm strip are interpreted by myself:   EKG: unclear rhyth at heart rate of 183, wide QRS duration, QTc unknown non-specificsegments and T waves no ectopy EKG not consistent with Acute STEMI Rhythm strip: Bradycardic rhythm with wide QRS, concerning for block in lead II   RADIOLOGY Unable to obtain    PROCEDURES:  Critical Care performed: Yes, see critical care procedure note(s)  .Critical Care  Performed by: Nicholaus Rolland BRAVO, MD Authorized by: Nicholaus Rolland BRAVO, MD   Critical care provider  statement:    Critical care time (minutes):  110   Critical care time was exclusive of:  Separately billable procedures and treating other patients   Critical care was necessary to treat or prevent imminent or life-threatening deterioration of the following conditions:  Cardiac failure, circulatory failure, respiratory failure and shock   Critical care was time spent  personally by me on the following activities:  Blood draw for specimens, development of treatment plan with patient or surrogate, discussions with consultants, re-evaluation of patient's condition, ordering and review of laboratory studies, ordering and performing treatments and interventions, evaluation of patient's response to treatment, ventilator management, vascular access procedures, obtaining history from patient or surrogate and interpretation of cardiac output measurements Comments:     (This is separate from CPR time) Procedure Name: Intubation Date/Time: 2025/01/11 4:04 PM  Performed by: Nicholaus Rolland BRAVO, MDPre-anesthesia Checklist: Patient identified, Emergency Drugs available, Timeout performed, Patient being monitored and Suction available Oxygen Delivery Method: Non-rebreather mask Induction Type: Rapid sequence Laryngoscope Size: Glidescope and 3 Grade View: Grade II Tube size: 7.5 mm Number of attempts: 1 Tube secured with: ETT holder    Central Line  Performed by: Nicholaus Rolland BRAVO, MD Authorized by: Nicholaus Rolland BRAVO, MD   Consent:    Consent obtained:  Emergent situation Universal protocol:    Site/side marked: yes     Immediately prior to procedure, a time out was called: yes     Patient identity confirmed:  Hospital-assigned identification number Pre-procedure details:    Indication(s): central venous access and insufficient peripheral access     Hand hygiene: Hand hygiene performed prior to insertion     Skin preparation:  Chlorhexidine  Procedure details:    Location:  R femoral   Procedural  supplies:  Triple lumen   Landmarks identified: yes     Ultrasound guidance: yes     Number of attempts:  1 Post-procedure details:    Post-procedure:  Dressing applied and line sutured   Assessment:  Blood return through all ports and free fluid flow Comments:     This was a crash central line placed under emergent conditions, unfortunately patient was in respiratory failure and did not have IV access and so right Pham line was placed without any sterile barriers given patient's tenuous situation .Ultrasound ED Peripheral IV (Provider)  Date/Time: 11-Jan-2025 4:06 PM  Performed by: Nicholaus Rolland BRAVO, MD Authorized by: Nicholaus Rolland BRAVO, MD   Procedure details:    Indications: multiple failed IV attempts     Location:  Right AC   Angiocath:  18 G   Bedside Ultrasound Guided: No     Images: not archived     Patient tolerated procedure without complications: Yes     Dressing applied: Yes   CPR  Date/Time: 2025/01/11 12:30 PM  Performed by: Nicholaus Rolland BRAVO, MD Authorized by: Nicholaus Rolland BRAVO, MD  CPR Procedure Details:    CPR/ACLS performed in the ED: Yes     Outcome: Pt declared dead    CPR performed via ACLS guidelines under my direct supervision.  See RN documentation for details including defibrillator use, medications, doses and timing.    MEDICATIONS ORDERED IN ED: Medications  atropine  1 MG/10ML injection (1 mg Intravenous Given 11-Jan-2025 1131)  lactated ringers  infusion (has no administration in time range)  ceFEPIme  (MAXIPIME ) 2 g in sodium chloride  0.9 % 100 mL IVPB (has no administration in time range)  metroNIDAZOLE  (FLAGYL ) IVPB 500 mg (has no administration in time range)  vancomycin  (VANCOCIN ) IVPB 1000 mg/200 mL premix (has no administration in time range)  magnesium  sulfate IVPB 2 g 50 mL (has no administration in time range)  rocuronium  (ZEMURON ) 100 MG/10ML injection (has no administration in time range)  etomidate  (AMIDATE ) 2 MG/ML injection (has no  administration in time range)  phytonadione  (VITAMIN K) 10 mg in dextrose  5 %  50 mL IVPB (has no administration in time range)  EPINEPHrine  (ADRENALIN ) 1 MG/10ML injection (1 mg Intravenous Given 2025-01-04 1239)  calcium  gluconate inj 10% (1 g) URGENT USE ONLY! (1 g Intravenous Given 01-04-2025 1138)     IMPRESSION / MDM / ASSESSMENT AND PLAN / ED COURSE                                Differential diagnosis includes, but is not limited to, high potassium, renal failure, upper GI bleed, arrhythmia, electrolyte derangement, hypoglycemia, ACS  Patient arrives emergently hypotensive hypoxic unresponsive but still has a pulse.  She appears to be in a wide rhythm on telemetry.  There was immediate concern for elevated potassium in the setting of low glucose.  Point-of-care glucose here was undetectable.  Unfortunately patient does not have access upon arrival and I inserted a right ultrasound-guided IV which initially worked and patient was given an amp of D50 and calcium  gluconate and 1 mg of atropine .  Unfortunately the IV infiltrated and patient became hypoxic despite bagging.  Given concern for impending respiratory failure decision to intubate was made.  I inserted a crash femoral line under emergent conditions which went without complication but was not done sterilely.  Levophed  was placed using this line and I ordered broad-spectrum antibiotics.  Patient underwent RSI and upon sedation had a large amount of bloody emesis.  Suction was placed in the esophagus by intubated through the cords of a 7.5.  Given the concern for an upper GI bleed I did not place an NG tube or an OGT.  I did page GI consultant for concern for upper GI bleed but after placing this page, patient went into cardiac arrest.  Another amp of calcium  gluconate was given along with bicarb and epinephrine .  Rhythm on pulse check was PEA.  I was notified that patient's POA and friend had arrived.  We discussed the patient's presentation and  care up to this time.  POA tells me that patient has no blood family left and that patient has expressed that she would be DNR/DNI and would not want any aggressive resuscitation.  POA who was brought in the room to witness CPR, and voiced gratitude for the team's efforts and requested that all resuscitative efforts at this time be stopped.  Patient was disconnected from the [place], she was in asystole at the next pulse check, there was no cardiac activity on ultrasound and no spontaneous breathing. time of death was called at 1241.  Case was discussed with the medical examiner and was determined not to be an ME case Vilinda Shallow).   Clinical Course as of January 04, 2025 1613  Fri January 04, 2025  1224 Paging GI emergently given upper GI bleed seen with bright red blood coming from esophagus on the intubation activating massive transfusion protocol.  I have been at the bedside consistently since patient arrival which has been over an hour and 10 minutes ago [HD]  1243 Time of death called at 12:41 POA Friends at bedside [HD]  1253 Talked with Norleen Shallow ME: Not a ME case. Death note to go PCP [HD]    Clinical Course User Index [HD] Nicholaus Rolland BRAVO, MD   -- Risk: 5 This patient has a high risk of morbidity due to further diagnostic testing or treatment. Rationale: This patients evaluation and management involve a high risk of morbidity due to the potential severity of presenting symptoms, need  for diagnostic testing, and/or initiation of treatment that may require close monitoring. The differential includes conditions with potential for significant deterioration or requiring escalation of care. Treatment decisions in the ED, including medication administration, procedural interventions, or disposition planning, reflect this level of risk. COPA: 5 The patient has the following acute or chronic illness/injury that poses a possible threat to life or bodily function: [X] : The patient has a potentially serious acute  condition or an acute exacerbation of a chronic illness requiring urgent evaluation and management in the Emergency Department. The clinical presentation necessitates immediate consideration of life-threatening or function-threatening diagnoses, even if they are ultimately ruled out.   FINAL CLINICAL IMPRESSION(S) / ED DIAGNOSES   Final diagnoses:  Cardiac arrest (HCC)  Hypoglycemia  Upper GI bleed  Hyperkalemia  Respiratory failure, unspecified chronicity, unspecified whether with hypoxia or hypercapnia (HCC)     Rx / DC Orders   ED Discharge Orders     None        Note:  This document was prepared using Dragon voice recognition software and may include unintentional dictation errors.   Nicholaus Rolland BRAVO, MD January 09, 2025 [phone]  "

## 2024-12-29 NOTE — ED Notes (Signed)
 MD Nicholaus starting intubation at this time

## 2024-12-29 NOTE — Code Documentation (Signed)
Patient time of death occurred at 1241 

## 2024-12-29 NOTE — Code Documentation (Addendum)
 Pulse check PEA on the monitor No palpable pulse present [place] applied CPR resumed

## 2024-12-29 NOTE — ED Notes (Signed)
"  Etomidate given  "

## 2024-12-29 NOTE — Telephone Encounter (Signed)
 Per [facility] Pap note dated 12/13/24   Spoke with pharmacist at [facility] regarding the cost of the 5mg  Vitamin K. Pharmacist ran test claim which indicated that the Vitamin K is not covered by the patient's prescription drug coverage. The acquisition cost of the Vitamin K would be over $1,000. I also was unable to find any PAP programs for Vitamin K. I reached out to the [facility] to see if they provide assistance for Vitamin K but they do not. Attempted to contact patient to make aware but was unable to reach. Left message for patient to return my phone call.  I spoke with [facility] and they did a test claim for the Vitamin K.  The patient's prescription coverage does not cover the Vitamin K.  The patient's cost for the Vitamin K will be over $1,000 as opposed to the $181 that she is currently paying at [facility].  The big difference in cost may have something to do with the vendor where the medication is purchased.  I spoke with patient and she said that she cannot afford the $181.  She wants to know if she can take the over-the-counter Vitamin K?  If not, would like her doctor to make another recommendation that will be covered by her insurance.  I made her medical team aware.

## 2024-12-29 NOTE — Telephone Encounter (Signed)
 Touched base with Dr. Melanee

## 2024-12-29 NOTE — Consult Note (Incomplete)
 CODE SEPSIS - PHARMACY COMMUNICATION  **Broad Spectrum Antibiotics should be administered within 1 hour of Sepsis diagnosis**  Time Code Sepsis Called/Page Received: 1136  Antibiotics Ordered: cefepime , metronidazole , and vancomycin   Time of 1st antibiotic administration: ***  Additional action taken by pharmacy: ***  If necessary, Name of Provider/Nurse Contacted: ***    Annabella LOISE Banks ,PharmD Clinical Pharmacist  08-Jan-2025  12:23 PM

## 2024-12-29 NOTE — ED Notes (Signed)
 LEVO titrated to 5mL

## 2024-12-29 NOTE — Sepsis Progress Note (Signed)
 eLink is following this Code Sepsis.

## 2024-12-29 NOTE — Telephone Encounter (Signed)
 Called patients son in regards to most recent STAT lab results. I left a message for him advising him that I have been unable to reach the patient in regards to kidney function declining so I have left multiple messages asking that she return my call ASAP.

## 2024-12-29 NOTE — ED Notes (Signed)
 Triple lumen central line place by MD Nicholaus in the right femoral

## 2024-12-29 NOTE — ED Triage Notes (Signed)
 Pt came in via [facility] due to friend calling out with concerns for pt. EMS found pt unresponsive in her closet and noticed blood around room. Pt's blood pressure was reading 30/11 and CBG read Low. MD Alm at bedside

## 2024-12-29 NOTE — ED Notes (Signed)
 Etomidate 30 Rocuronium  70 To be given to pt

## 2024-12-29 NOTE — ED Notes (Signed)
 Time out for intubation of pt.

## 2024-12-29 DEATH — deceased

## 2024-12-30 LAB — CULTURE, BLOOD (ROUTINE X 2)
Culture: NO GROWTH
Culture: NO GROWTH
